# Patient Record
Sex: Female | Born: 1950
Health system: Southern US, Community
[De-identification: ages and names within clinical notes are randomized; demographics above are authoritative.]

## PROBLEM LIST (undated history)

## (undated) DIAGNOSIS — E46 Unspecified protein-calorie malnutrition: Secondary | ICD-10-CM

## (undated) DIAGNOSIS — I639 Cerebral infarction, unspecified: Secondary | ICD-10-CM

## (undated) DIAGNOSIS — Z923 Personal history of irradiation: Secondary | ICD-10-CM

## (undated) DIAGNOSIS — I10 Essential (primary) hypertension: Secondary | ICD-10-CM

## (undated) DIAGNOSIS — E119 Type 2 diabetes mellitus without complications: Secondary | ICD-10-CM

## (undated) DIAGNOSIS — C541 Malignant neoplasm of endometrium: Secondary | ICD-10-CM

## (undated) DIAGNOSIS — E785 Hyperlipidemia, unspecified: Secondary | ICD-10-CM

## (undated) DIAGNOSIS — K529 Noninfective gastroenteritis and colitis, unspecified: Secondary | ICD-10-CM

## (undated) HISTORY — DX: Hyperlipidemia, unspecified: E78.5

## (undated) HISTORY — PX: NO PAST SURGERIES: SHX2092

## (undated) HISTORY — DX: Malignant neoplasm of endometrium: C54.1

---

## 1999-03-06 ENCOUNTER — Other Ambulatory Visit: Admission: RE | Admit: 1999-03-06 | Discharge: 1999-03-06 | Payer: Self-pay | Admitting: Endocrinology

## 2000-01-08 ENCOUNTER — Encounter: Payer: Self-pay | Admitting: *Deleted

## 2000-01-08 ENCOUNTER — Emergency Department (HOSPITAL_COMMUNITY): Admission: EM | Admit: 2000-01-08 | Discharge: 2000-01-08 | Payer: Self-pay | Admitting: Emergency Medicine

## 2000-03-31 ENCOUNTER — Other Ambulatory Visit: Admission: RE | Admit: 2000-03-31 | Discharge: 2000-03-31 | Payer: Self-pay | Admitting: Endocrinology

## 2001-04-15 ENCOUNTER — Ambulatory Visit (HOSPITAL_COMMUNITY): Admission: RE | Admit: 2001-04-15 | Discharge: 2001-04-15 | Payer: Self-pay | Admitting: *Deleted

## 2003-06-06 ENCOUNTER — Encounter: Admission: RE | Admit: 2003-06-06 | Discharge: 2003-06-06 | Payer: Self-pay | Admitting: Internal Medicine

## 2003-06-20 ENCOUNTER — Other Ambulatory Visit: Admission: RE | Admit: 2003-06-20 | Discharge: 2003-06-20 | Payer: Self-pay | Admitting: Oncology

## 2005-04-01 ENCOUNTER — Emergency Department (HOSPITAL_COMMUNITY): Admission: EM | Admit: 2005-04-01 | Discharge: 2005-04-01 | Payer: Self-pay | Admitting: *Deleted

## 2006-07-09 ENCOUNTER — Encounter: Admission: RE | Admit: 2006-07-09 | Discharge: 2006-07-09 | Payer: Self-pay | Admitting: Internal Medicine

## 2006-09-03 ENCOUNTER — Other Ambulatory Visit: Admission: RE | Admit: 2006-09-03 | Discharge: 2006-09-03 | Payer: Self-pay | Admitting: Internal Medicine

## 2007-11-21 ENCOUNTER — Emergency Department (HOSPITAL_COMMUNITY): Admission: EM | Admit: 2007-11-21 | Discharge: 2007-11-21 | Payer: Self-pay | Admitting: Family Medicine

## 2007-11-25 ENCOUNTER — Emergency Department (HOSPITAL_COMMUNITY): Admission: EM | Admit: 2007-11-25 | Discharge: 2007-11-25 | Payer: Self-pay | Admitting: Emergency Medicine

## 2008-12-28 ENCOUNTER — Encounter: Admission: RE | Admit: 2008-12-28 | Discharge: 2008-12-28 | Payer: Self-pay | Admitting: Internal Medicine

## 2009-01-24 ENCOUNTER — Other Ambulatory Visit: Admission: RE | Admit: 2009-01-24 | Discharge: 2009-01-30 | Payer: Self-pay | Admitting: Internal Medicine

## 2010-02-08 ENCOUNTER — Encounter: Admission: RE | Admit: 2010-02-08 | Discharge: 2010-02-08 | Payer: Self-pay | Admitting: Internal Medicine

## 2010-02-14 ENCOUNTER — Encounter: Admission: RE | Admit: 2010-02-14 | Discharge: 2010-02-14 | Payer: Self-pay | Admitting: Internal Medicine

## 2010-09-20 NOTE — Procedures (Signed)
[facility]  Patient:    Maria Avila, Maria Avila Visit Number: [id number] MRN: [id number]          Service Type: END Location: ENDO Attending Physician:  Sabino Gasser Dictated by:   Sabino Gasser, M.D. Admit Date:  04/15/2001                             Procedure Report  PROCEDURE:  Colonoscopy.  SURGEON:  Sabino Gasser, M.D.  INDICATIONS:  Change in bowel habits, predominantly constipation, colon cancer screening.  ANESTHESIA:  Demerol 100 and Versed 12 mg.  DESCRIPTION OF PROCEDURE:  With the patient mildly sedated in the left lateral decubitus position, the Olympus videoscopic colonoscope was inserted in the rectum and passed under direct vision to the cecum, identified by the ileocecal valve and appendiceal orifice, both of which were photographed.  The prep was good.  From this point, the colonoscope was slowly withdrawn, taking circumferential views of the entire colonic mucosa, stopping only in the rectum which appeared on direct and on retroflexed view.  The endoscope was straightened and withdrawn.  The patients vital signs and pulse oximeter remained stable.  The patient tolerated the procedure well and without apparent complications.  FINDINGS:  This is a negative colonoscopic examination.  PLAN:  Will add more fiber to the patients diet and have her follow up with me as an outpatient to assess response. Dictated by:   Sabino Gasser, M.D. Attending Physician:  Sabino Gasser DD:  04/15/01 TD:  04/15/01 Job: [id number]

## 2011-01-31 LAB — URINALYSIS, ROUTINE W REFLEX MICROSCOPIC
Bilirubin Urine: NEGATIVE
Ketones, ur: NEGATIVE
Nitrite: NEGATIVE
Specific Gravity, Urine: 1.017
Urobilinogen, UA: 0.2
pH: 6.5

## 2011-01-31 LAB — URINE MICROSCOPIC-ADD ON

## 2011-03-10 ENCOUNTER — Other Ambulatory Visit: Payer: Self-pay | Admitting: Internal Medicine

## 2011-03-10 DIAGNOSIS — R1900 Intra-abdominal and pelvic swelling, mass and lump, unspecified site: Secondary | ICD-10-CM

## 2011-03-13 ENCOUNTER — Ambulatory Visit
Admission: RE | Admit: 2011-03-13 | Discharge: 2011-03-13 | Disposition: A | Discharge status: Home / Self Care | Payer: BC Managed Care – PPO | Source: Ambulatory Visit | Attending: Internal Medicine | Admitting: Internal Medicine

## 2011-03-13 DIAGNOSIS — R1900 Intra-abdominal and pelvic swelling, mass and lump, unspecified site: Secondary | ICD-10-CM

## 2011-12-08 ENCOUNTER — Other Ambulatory Visit: Payer: Self-pay | Admitting: Internal Medicine

## 2011-12-08 DIAGNOSIS — Z1231 Encounter for screening mammogram for malignant neoplasm of breast: Secondary | ICD-10-CM

## 2011-12-25 ENCOUNTER — Ambulatory Visit
Admission: RE | Admit: 2011-12-25 | Discharge: 2011-12-25 | Disposition: A | Discharge status: Home / Self Care | Payer: BC Managed Care – PPO | Source: Ambulatory Visit | Attending: Internal Medicine | Admitting: Internal Medicine

## 2011-12-25 DIAGNOSIS — Z1231 Encounter for screening mammogram for malignant neoplasm of breast: Secondary | ICD-10-CM

## 2012-04-19 ENCOUNTER — Other Ambulatory Visit (HOSPITAL_COMMUNITY)
Admission: RE | Admit: 2012-04-19 | Discharge: 2012-04-19 | Disposition: A | Discharge status: Home / Self Care | Payer: BC Managed Care – PPO | Source: Ambulatory Visit | Attending: Internal Medicine | Admitting: Internal Medicine

## 2012-04-19 DIAGNOSIS — Z01419 Encounter for gynecological examination (general) (routine) without abnormal findings: Secondary | ICD-10-CM | POA: Insufficient documentation

## 2013-02-15 ENCOUNTER — Other Ambulatory Visit: Payer: Self-pay

## 2013-02-15 DIAGNOSIS — Z1231 Encounter for screening mammogram for malignant neoplasm of breast: Secondary | ICD-10-CM

## 2013-03-04 ENCOUNTER — Ambulatory Visit
Admission: RE | Admit: 2013-03-04 | Discharge: 2013-03-04 | Disposition: A | Discharge status: Home / Self Care | Payer: BC Managed Care – PPO | Source: Ambulatory Visit

## 2013-03-04 DIAGNOSIS — Z1231 Encounter for screening mammogram for malignant neoplasm of breast: Secondary | ICD-10-CM

## 2013-03-08 ENCOUNTER — Ambulatory Visit: Payer: BC Managed Care – PPO

## 2014-02-13 ENCOUNTER — Other Ambulatory Visit: Payer: Self-pay

## 2014-02-13 DIAGNOSIS — Z1231 Encounter for screening mammogram for malignant neoplasm of breast: Secondary | ICD-10-CM

## 2014-03-07 ENCOUNTER — Ambulatory Visit
Admission: RE | Admit: 2014-03-07 | Discharge: 2014-03-07 | Disposition: A | Discharge status: Home / Self Care | Payer: BC Managed Care – PPO | Source: Ambulatory Visit

## 2014-03-07 DIAGNOSIS — Z1231 Encounter for screening mammogram for malignant neoplasm of breast: Secondary | ICD-10-CM

## 2015-04-09 ENCOUNTER — Other Ambulatory Visit: Payer: Self-pay

## 2015-04-09 DIAGNOSIS — Z1231 Encounter for screening mammogram for malignant neoplasm of breast: Secondary | ICD-10-CM

## 2015-04-26 ENCOUNTER — Ambulatory Visit
Admission: RE | Admit: 2015-04-26 | Discharge: 2015-04-26 | Disposition: A | Discharge status: Home / Self Care | Payer: BLUE CROSS/BLUE SHIELD | Source: Ambulatory Visit

## 2015-04-26 DIAGNOSIS — Z1231 Encounter for screening mammogram for malignant neoplasm of breast: Secondary | ICD-10-CM

## 2015-05-17 ENCOUNTER — Other Ambulatory Visit (HOSPITAL_COMMUNITY)
Admission: RE | Admit: 2015-05-17 | Discharge: 2015-05-17 | Disposition: A | Discharge status: Home / Self Care | Payer: BLUE CROSS/BLUE SHIELD | Source: Ambulatory Visit | Attending: Internal Medicine | Admitting: Internal Medicine

## 2015-05-17 DIAGNOSIS — Z1151 Encounter for screening for human papillomavirus (HPV): Secondary | ICD-10-CM | POA: Insufficient documentation

## 2015-05-17 DIAGNOSIS — Z01419 Encounter for gynecological examination (general) (routine) without abnormal findings: Secondary | ICD-10-CM | POA: Diagnosis present

## 2016-05-26 ENCOUNTER — Other Ambulatory Visit: Payer: Self-pay | Admitting: Internal Medicine

## 2016-05-26 DIAGNOSIS — Z1231 Encounter for screening mammogram for malignant neoplasm of breast: Secondary | ICD-10-CM

## 2016-05-28 ENCOUNTER — Ambulatory Visit
Admission: RE | Admit: 2016-05-28 | Discharge: 2016-05-28 | Disposition: A | Discharge status: Home / Self Care | Payer: BLUE CROSS/BLUE SHIELD | Source: Ambulatory Visit | Attending: Internal Medicine | Admitting: Internal Medicine

## 2016-05-28 DIAGNOSIS — Z1231 Encounter for screening mammogram for malignant neoplasm of breast: Secondary | ICD-10-CM

## 2017-03-16 DIAGNOSIS — I10 Essential (primary) hypertension: Secondary | ICD-10-CM | POA: Diagnosis not present

## 2017-03-16 DIAGNOSIS — Z23 Encounter for immunization: Secondary | ICD-10-CM | POA: Diagnosis not present

## 2017-05-05 DIAGNOSIS — I639 Cerebral infarction, unspecified: Secondary | ICD-10-CM

## 2017-05-05 HISTORY — DX: Cerebral infarction, unspecified: I63.9

## 2017-07-13 DIAGNOSIS — E7800 Pure hypercholesterolemia, unspecified: Secondary | ICD-10-CM | POA: Diagnosis not present

## 2017-07-13 DIAGNOSIS — R162 Hepatomegaly with splenomegaly, not elsewhere classified: Secondary | ICD-10-CM | POA: Diagnosis not present

## 2017-07-13 DIAGNOSIS — N390 Urinary tract infection, site not specified: Secondary | ICD-10-CM | POA: Diagnosis not present

## 2017-07-13 DIAGNOSIS — D709 Neutropenia, unspecified: Secondary | ICD-10-CM | POA: Diagnosis not present

## 2017-07-13 DIAGNOSIS — I10 Essential (primary) hypertension: Secondary | ICD-10-CM | POA: Diagnosis not present

## 2017-07-13 DIAGNOSIS — D509 Iron deficiency anemia, unspecified: Secondary | ICD-10-CM | POA: Diagnosis not present

## 2017-07-13 DIAGNOSIS — E119 Type 2 diabetes mellitus without complications: Secondary | ICD-10-CM | POA: Diagnosis not present

## 2017-07-13 DIAGNOSIS — E559 Vitamin D deficiency, unspecified: Secondary | ICD-10-CM | POA: Diagnosis not present

## 2017-07-13 DIAGNOSIS — R945 Abnormal results of liver function studies: Secondary | ICD-10-CM | POA: Diagnosis not present

## 2017-07-13 DIAGNOSIS — Z7982 Long term (current) use of aspirin: Secondary | ICD-10-CM | POA: Diagnosis not present

## 2017-07-13 DIAGNOSIS — R740 Nonspecific elevation of levels of transaminase and lactic acid dehydrogenase [LDH]: Secondary | ICD-10-CM | POA: Diagnosis not present

## 2017-07-13 DIAGNOSIS — D649 Anemia, unspecified: Secondary | ICD-10-CM | POA: Diagnosis not present

## 2017-07-16 DIAGNOSIS — E119 Type 2 diabetes mellitus without complications: Secondary | ICD-10-CM | POA: Diagnosis not present

## 2017-07-16 DIAGNOSIS — Z7982 Long term (current) use of aspirin: Secondary | ICD-10-CM | POA: Diagnosis not present

## 2017-07-16 DIAGNOSIS — Z0001 Encounter for general adult medical examination with abnormal findings: Secondary | ICD-10-CM | POA: Diagnosis not present

## 2017-07-16 DIAGNOSIS — E7800 Pure hypercholesterolemia, unspecified: Secondary | ICD-10-CM | POA: Diagnosis not present

## 2017-07-16 DIAGNOSIS — Z1212 Encounter for screening for malignant neoplasm of rectum: Secondary | ICD-10-CM | POA: Diagnosis not present

## 2017-07-16 DIAGNOSIS — I10 Essential (primary) hypertension: Secondary | ICD-10-CM | POA: Diagnosis not present

## 2017-07-17 ENCOUNTER — Telehealth: Payer: Self-pay | Admitting: Oncology

## 2017-07-17 NOTE — Telephone Encounter (Signed)
Appt has been scheduled for the pt to see Dr. Alen Blew on 3/19 at 11am. Pt aware to arrive 30 minutes early.

## 2017-07-20 ENCOUNTER — Other Ambulatory Visit: Payer: Self-pay | Admitting: Internal Medicine

## 2017-07-20 DIAGNOSIS — R634 Abnormal weight loss: Secondary | ICD-10-CM

## 2017-07-20 DIAGNOSIS — R162 Hepatomegaly with splenomegaly, not elsewhere classified: Secondary | ICD-10-CM

## 2017-07-21 ENCOUNTER — Telehealth: Payer: Self-pay | Admitting: Oncology

## 2017-07-21 ENCOUNTER — Inpatient Hospital Stay: Payer: Medicare Other

## 2017-07-21 ENCOUNTER — Inpatient Hospital Stay: Payer: Medicare Other | Attending: Oncology | Admitting: Oncology

## 2017-07-21 VITALS — BP 95/70 | HR 122 | Temp 98.7°F | Resp 20 | Ht 62.0 in | Wt 144.7 lb

## 2017-07-21 DIAGNOSIS — D709 Neutropenia, unspecified: Secondary | ICD-10-CM | POA: Diagnosis not present

## 2017-07-21 DIAGNOSIS — E119 Type 2 diabetes mellitus without complications: Secondary | ICD-10-CM | POA: Diagnosis not present

## 2017-07-21 DIAGNOSIS — C8590 Non-Hodgkin lymphoma, unspecified, unspecified site: Secondary | ICD-10-CM

## 2017-07-21 DIAGNOSIS — R634 Abnormal weight loss: Secondary | ICD-10-CM | POA: Diagnosis not present

## 2017-07-21 DIAGNOSIS — D649 Anemia, unspecified: Secondary | ICD-10-CM | POA: Insufficient documentation

## 2017-07-21 DIAGNOSIS — D72820 Lymphocytosis (symptomatic): Secondary | ICD-10-CM

## 2017-07-21 LAB — CMP (CANCER CENTER ONLY)
ALBUMIN: 3.1 g/dL — AB (ref 3.5–5.0)
ALT: 47 U/L (ref 0–55)
ANION GAP: 9 (ref 3–11)
AST: 111 U/L — ABNORMAL HIGH (ref 5–34)
Alkaline Phosphatase: 90 U/L (ref 40–150)
BUN: 6 mg/dL — ABNORMAL LOW (ref 7–26)
CO2: 30 mmol/L — AB (ref 22–29)
Calcium: 9.6 mg/dL (ref 8.4–10.4)
Chloride: 97 mmol/L — ABNORMAL LOW (ref 98–109)
Creatinine: 0.78 mg/dL (ref 0.60–1.10)
GFR, Est AFR Am: >60 mL/min (ref >60)
GFR, Estimated: >60 mL/min (ref >60)
GLUCOSE: 104 mg/dL (ref 70–140)
POTASSIUM: 4 mmol/L (ref 3.5–5.1)
SODIUM: 136 mmol/L (ref 136–145)
Total Bilirubin: 1.2 mg/dL (ref 0.2–1.2)
Total Protein: 7 g/dL (ref 6.4–8.3)

## 2017-07-21 LAB — CBC WITH DIFFERENTIAL (CANCER CENTER ONLY)
Basophils Absolute: 0 10*3/uL (ref 0.0–0.1)
Basophils Relative: 1 %
Eosinophils Absolute: 0 10*3/uL (ref 0.0–0.5)
Eosinophils Relative: 1 %
HCT: 32.5 % — ABNORMAL LOW (ref 34.8–46.6)
HEMOGLOBIN: 11.3 g/dL — AB (ref 11.6–15.9)
Lymphocytes Relative: 45 %
Lymphs Abs: 1.7 10*3/uL (ref 0.9–3.3)
MCH: 36.1 pg — AB (ref 25.1–34.0)
MCHC: 34.8 g/dL (ref 31.5–36.0)
MCV: 103.8 fL — ABNORMAL HIGH (ref 79.5–101.0)
MONO ABS: 0.4 10*3/uL (ref 0.1–0.9)
MONOS PCT: 11 %
NEUTROS ABS: 1.6 10*3/uL (ref 1.5–6.5)
Neutrophils Relative %: 42 %
PLATELETS: 186 10*3/uL (ref 145–400)
RBC: 3.13 MIL/uL — AB (ref 3.70–5.45)
RDW: 14.6 % — ABNORMAL HIGH (ref 11.2–14.5)
WBC Count: 3.8 10*3/uL — ABNORMAL LOW (ref 3.9–10.3)

## 2017-07-21 LAB — LACTATE DEHYDROGENASE: LDH: 181 U/L (ref 125–245)

## 2017-07-21 LAB — SAVE SMEAR

## 2017-07-21 NOTE — Telephone Encounter (Signed)
Appointment scheduled AVS/Calendar printed per 3/19 los

## 2017-07-21 NOTE — Progress Notes (Signed)
Reason for Referral: Lymphocytosis.  HPI: 67 year old woman currently of [place] where she lived the majority of her life.  She has a past medical history significant for type 2 diabetes, hyperlipidemia but no other significant comorbid conditions.  She was seen by her primary care physician for routine physical as well as complaints of poor appetite.  She has noticed that she has lost close to 40 pounds in the last few years.  She does not report any episodes of dysphagia or odontophagia but does report lack of appetite.  She had a CBC done at the time showed a hemoglobin of 10.6 and white cell count of 4.1.  Her platelets were 185.  Neutrophil percentage was 24.1% and a lymphocytosis of 64.1.  Previous CBC obtained in December 2015 showed a white cell count of 4.8, hemoglobin of 13.4 and neutrophils low at the time at 32.0%.  She did have a lymphocytosis back then as well.  Her creatinine was 1.1 with normal calcium, sodium and other electrolytes.  Based on these findings she was referred to me for evaluation.  She is really no specific symptoms and her complaints are rather vague.  She denies any lymphadenopathy or easy bruising.  She does report feeling cold all the time.  She is up-to-date on her mammography as well as colonoscopy.  She does not report any headaches, blurry vision, syncope or seizures. Does not report any fevers, chills or sweats.  Does not report any cough, wheezing or hemoptysis.  Does not report any chest pain, palpitation, orthopnea or leg edema.  Does not report any nausea, vomiting or abdominal pain.  Does not report any diarrhea but does report constipation.  Does not report any skeletal complaints.    Does not report frequency, urgency or hematuria.  Does not report any skin rashes or lesions. Does not report any heat or cold intolerance.  Does not report any lymphadenopathy or petechiae.  Does not report any anxiety or depression.  Remaining review of systems is negative.      Past medical history mentioned the history of present illness.   Current Outpatient Medications:  .  aspirin EC 81 MG tablet, Take 81 mg by mouth daily., Disp: , Rfl:  .  Cholecalciferol (VITAMIN D) 2000 units CAPS, Take 1 capsule by mouth daily., Disp: , Rfl:  .  lisinopril (PRINIVIL,ZESTRIL) 10 MG tablet, Take 10 mg by mouth daily., Disp: , Rfl: 3 .  Multiple Vitamin (MULTIVITAMIN) capsule, Take 1 capsule by mouth daily., Disp: , Rfl:  .  naproxen sodium (ALEVE) 220 MG tablet, Take 220 mg by mouth 2 (two) times daily as needed., Disp: , Rfl:  .  Omega-3 Fatty Acids (FISH OIL) 1000 MG CAPS, Take 1 capsule by mouth daily., Disp: , Rfl:  .  pantoprazole (PROTONIX) 40 MG tablet, Take 40 mg by mouth daily., Disp: , Rfl: :  Allergies  Allergen Reactions  . Crestor [Rosuvastatin Calcium] Swelling and Other (See Comments)    Swelling of legs and muscle weakness  . Zocor [Simvastatin] Swelling and Other (See Comments)    Swelling of legs and muscle weakness  :  No family history of malignancy or blood disorder noted    Social History   Socioeconomic History  . Marital status: Married    Spouse name: Not on file  . Number of children: Not on file  . Years of education: Not on file  . Highest education level: Not on file  Social Needs  . Financial resource strain:  Not on file  . Food insecurity - worry: Not on file  . Food insecurity - inability: Not on file  . Transportation needs - medical: Not on file  . Transportation needs - non-medical: Not on file  Occupational History  . Not on file  Tobacco Use  . Smoking status: Not on file  Substance and Sexual Activity  . Alcohol use: Not on file  . Drug use: Not on file  . Sexual activity: Not on file  Other Topics Concern  . Not on file  Social History Narrative  . Not on file  :  Pertinent items are noted in HPI.  Exam: Blood pressure 95/70, pulse (!) 122, temperature 98.7 F (37.1 C), temperature source Oral, resp.  rate 20, height 5\' 2"  (1.575 m), weight 144 lb 11.2 oz (65.6 kg), SpO2 100 %. General appearance: alert and cooperative appeared without distress. Head: atraumatic without any abnormalities. Eyes: conjunctivae/corneas clear. PERRL.  Sclera anicteric. Throat: lips, mucosa, and tongue normal; without oral thrush or ulcers. Resp: clear to auscultation bilaterally without rhonchi, wheezes or dullness to percussion. Cardio: regular rate and rhythm, S1, S2 normal, no murmur, click, rub or gallop GI: soft, non-tender; bowel sounds normal; no masses,  no splenomegaly. Skin: Skin color, texture, turgor normal. No rashes or lesions Lymph nodes: Cervical, supraclavicular, and axillary nodes normal. Neurologic: Grossly normal without any motor, sensory or deep tendon reflexes. Musculoskeletal: No joint deformity or effusion.    Assessment and Plan:   67 year old woman with the following issues:  1.  Lymphocytosis and neutropenia: This was detected on a CBC obtained on March 2019.  Her white cell count was 4.1 with neutrophil percentage 24%.  She has also lymphocytosis noted.  Her CBC obtained in 2015 showed similar findings but certainly showed worsening anemia hemoglobin dropping from 13 to 10.1.  Her platelet counts continue to be within normal range.  The differential diagnosis was discussed today with the patient in detail.  This includes lymphoproliferative disorder such as CLL or other lymphomas.  Reactive findings related to autoimmune etiologies or benign fluctuating neutropenia causing mild lymphocytosis.  To investigate this fully, she will have a repeat a CBC today, for cytometry and a peripheral smear to rule out lymphoproliferative disorder and lymphoma.  CT scan of the chest abdomen and pelvis will also be obtained given her weight loss and constitutional symptoms.  Management of this disorder will be deferred until a final diagnosis is made.  2.  Weight loss and constitutional symptoms:  Unclear etiology.  Malignancy need to be evaluated.  Evaluation for lymphoma as outlined above.  She is up-to-date on her age-appropriate cancer screening including colonoscopy and mammography.  3.  Follow-up: We will be in the next few weeks to follow her progress and discuss the results of the workup.  60  minutes was spent with the patient face-to-face today.  More than 50% of time was dedicated to patient counseling, education and coordination of her multifaceted care.

## 2017-07-22 LAB — FLOW CYTOMETRY

## 2017-07-29 ENCOUNTER — Ambulatory Visit
Admission: RE | Admit: 2017-07-29 | Discharge: 2017-07-29 | Disposition: A | Discharge status: Home / Self Care | Payer: Medicare Other | Source: Ambulatory Visit | Attending: Internal Medicine | Admitting: Internal Medicine

## 2017-07-29 DIAGNOSIS — R162 Hepatomegaly with splenomegaly, not elsewhere classified: Secondary | ICD-10-CM

## 2017-07-29 DIAGNOSIS — R634 Abnormal weight loss: Secondary | ICD-10-CM

## 2017-07-29 DIAGNOSIS — K760 Fatty (change of) liver, not elsewhere classified: Secondary | ICD-10-CM | POA: Diagnosis not present

## 2017-07-30 DIAGNOSIS — E119 Type 2 diabetes mellitus without complications: Secondary | ICD-10-CM | POA: Diagnosis not present

## 2017-07-30 DIAGNOSIS — D649 Anemia, unspecified: Secondary | ICD-10-CM | POA: Diagnosis not present

## 2017-07-30 DIAGNOSIS — I10 Essential (primary) hypertension: Secondary | ICD-10-CM | POA: Diagnosis not present

## 2017-07-30 DIAGNOSIS — R740 Nonspecific elevation of levels of transaminase and lactic acid dehydrogenase [LDH]: Secondary | ICD-10-CM | POA: Diagnosis not present

## 2017-07-30 DIAGNOSIS — E639 Nutritional deficiency, unspecified: Secondary | ICD-10-CM | POA: Diagnosis not present

## 2017-07-30 DIAGNOSIS — D709 Neutropenia, unspecified: Secondary | ICD-10-CM | POA: Diagnosis not present

## 2017-08-03 DIAGNOSIS — R1310 Dysphagia, unspecified: Secondary | ICD-10-CM | POA: Diagnosis not present

## 2017-08-03 DIAGNOSIS — R748 Abnormal levels of other serum enzymes: Secondary | ICD-10-CM | POA: Diagnosis not present

## 2017-08-03 DIAGNOSIS — R6881 Early satiety: Secondary | ICD-10-CM | POA: Diagnosis not present

## 2017-08-11 DIAGNOSIS — H1712 Central corneal opacity, left eye: Secondary | ICD-10-CM | POA: Diagnosis not present

## 2017-08-11 DIAGNOSIS — H3589 Other specified retinal disorders: Secondary | ICD-10-CM | POA: Diagnosis not present

## 2017-08-11 DIAGNOSIS — H25813 Combined forms of age-related cataract, bilateral: Secondary | ICD-10-CM | POA: Diagnosis not present

## 2017-08-11 DIAGNOSIS — E119 Type 2 diabetes mellitus without complications: Secondary | ICD-10-CM | POA: Diagnosis not present

## 2017-08-17 ENCOUNTER — Inpatient Hospital Stay: Payer: Medicare Other | Attending: Oncology | Admitting: Oncology

## 2017-08-17 VITALS — BP 128/80 | HR 77 | Temp 98.3°F | Resp 16 | Ht 62.0 in | Wt 143.0 lb

## 2017-08-17 DIAGNOSIS — D72819 Decreased white blood cell count, unspecified: Secondary | ICD-10-CM | POA: Diagnosis not present

## 2017-08-17 DIAGNOSIS — D72820 Lymphocytosis (symptomatic): Secondary | ICD-10-CM | POA: Diagnosis not present

## 2017-08-17 DIAGNOSIS — R634 Abnormal weight loss: Secondary | ICD-10-CM | POA: Diagnosis not present

## 2017-08-17 DIAGNOSIS — D709 Neutropenia, unspecified: Secondary | ICD-10-CM | POA: Diagnosis not present

## 2017-08-17 DIAGNOSIS — D7589 Other specified diseases of blood and blood-forming organs: Secondary | ICD-10-CM | POA: Diagnosis not present

## 2017-08-17 NOTE — Progress Notes (Signed)
Hematology and Oncology Follow Up Visit  Maria Avila [id number] 1950-07-17 67 y.o. 08/17/2017 9:27 AM [name], MDPharr, Thayer Jew, MD   Principle Diagnosis: 67 year old woman with a lymphocytosis and weight loss diagnosed in March 2019.  Her workup did not reveal any proliferative disorder or malignancy.   Current therapy: Active surveillance.  Interim History: [name] presents today for a follow-up visit.  She does not report any headaches, blurry vision, syncope or seizures. Does not report any fevers, chills or sweats.  Does not report any cough, wheezing or hemoptysis.  Does not report any chest pain, palpitation, orthopnea or leg edema.  Does not report any nausea, vomiting or abdominal pain.  Does not report any constipation or diarrhea.  Does not report any skeletal complaints.    Does not report frequency, urgency or hematuria.  Does not report any skin rashes or lesions. Does not report any heat or cold intolerance.  Does not report any lymphadenopathy or petechiae.  Does not report any anxiety or depression.  Remaining review of systems is negative.    Medications: I have reviewed the patient's current medications.  Current Outpatient Medications  Medication Sig Dispense Refill  . aspirin EC 81 MG tablet Take 81 mg by mouth daily.    . Cholecalciferol (VITAMIN D) 2000 units CAPS Take 1 capsule by mouth daily.    Marland Kitchen lisinopril (PRINIVIL,ZESTRIL) 10 MG tablet Take 10 mg by mouth daily.  3  . Multiple Vitamin (MULTIVITAMIN) capsule Take 1 capsule by mouth daily.    . naproxen sodium (ALEVE) 220 MG tablet Take 220 mg by mouth 2 (two) times daily as needed.    . Omega-3 Fatty Acids (FISH OIL) 1000 MG CAPS Take 1 capsule by mouth daily.    . pantoprazole (PROTONIX) 40 MG tablet Take 40 mg by mouth daily.     No current facility-administered medications for this visit.      Allergies:  Allergies  Allergen Reactions  . Crestor [Rosuvastatin Calcium] Swelling and Other (See  Comments)    Swelling of legs and muscle weakness  . Zocor [Simvastatin] Swelling and Other (See Comments)    Swelling of legs and muscle weakness    Past Medical History, Surgical history, Social history, and Family History were reviewed and updated.  Review of Systems:  Remaining ROS negative.  Physical Exam:  ECOG:  General appearance: alert and cooperative appeared without distress. Head: Normocephalic, without obvious abnormality Oropharynx: No oral thrush or ulcers. Eyes: No scleral icterus.  Pupils are equal and round reactive to light. Lymph nodes: Cervical, supraclavicular, and axillary nodes normal. Heart:regular rate and rhythm, S1, S2 normal, no murmur, click, rub or gallop Lung:chest clear, no wheezing, rales, normal symmetric air entry Abdomin: soft, non-tender, without masses or organomegaly. Neurological: No motor, sensory deficits.  Intact deep tendon reflexes. Skin: No rashes or lesions.  No ecchymosis or petechiae. Musculoskeletal: No joint deformity or effusion. Psychiatric: Mood and affect are appropriate.    Lab Results: Lab Results  Component Value Date   WBC 3.8 (L) 07/21/2017   HCT 32.5 (L) 07/21/2017   MCV 103.8 (H) 07/21/2017   PLT 186 07/21/2017     Chemistry      Component Value Date/Time   NA 136 07/21/2017 1131   K 4.0 07/21/2017 1131   CL 97 (L) 07/21/2017 1131   CO2 30 (H) 07/21/2017 1131   BUN 6 (L) 07/21/2017 1131   CREATININE 0.78 07/21/2017 1131      Component Value Date/Time  CALCIUM 9.6 07/21/2017 1131   ALKPHOS 90 07/21/2017 1131   AST 111 (H) 07/21/2017 1131   ALT 47 07/21/2017 1131   BILITOT 1.2 07/21/2017 1131       Impression and Plan:  67 year old woman with:   1.  Lymphocytosis and neutropenia diagnosed in March 2019: Repeat a CBC on July 21, 2017 was personally reviewed today and showed her absolute neutrophil count of 1600 without any lymphocytosis.  Flow cytometry did not reveal any abnormalities or  lymphoproliferative disorder.  Her leukocytopenia is likely related to benign findings and no further intervention is needed.    2.  Weight loss and constitutional symptoms: CT scan of abdomen and pelvis obtained on 07/29/2017 was reviewed and showed no evidence of malignancy.  Her weight has been stable in the last month.  3.  Macrocytosis: Could be related to early liver disease associated with her hepatic steatosis.  No intervention is needed at this time.  3.  Follow-up: I am happy to see her in the future as needed.  15 minutes was spent with the patient face-to-face today.  More than 50% of time was dedicated to patient counseling, education and answering question regarding her abnormal laboratory findings.       Zola Button, MD 4/15/20199:27 AM

## 2017-08-26 DIAGNOSIS — K2980 Duodenitis without bleeding: Secondary | ICD-10-CM | POA: Diagnosis not present

## 2017-08-26 DIAGNOSIS — K228 Other specified diseases of esophagus: Secondary | ICD-10-CM | POA: Diagnosis not present

## 2017-08-26 DIAGNOSIS — K2930 Chronic superficial gastritis without bleeding: Secondary | ICD-10-CM | POA: Diagnosis not present

## 2017-08-26 DIAGNOSIS — R1012 Left upper quadrant pain: Secondary | ICD-10-CM | POA: Diagnosis not present

## 2017-08-26 DIAGNOSIS — R6881 Early satiety: Secondary | ICD-10-CM | POA: Diagnosis not present

## 2017-08-26 DIAGNOSIS — R1310 Dysphagia, unspecified: Secondary | ICD-10-CM | POA: Diagnosis not present

## 2017-08-28 DIAGNOSIS — R9389 Abnormal findings on diagnostic imaging of other specified body structures: Secondary | ICD-10-CM | POA: Diagnosis not present

## 2017-09-03 DIAGNOSIS — K228 Other specified diseases of esophagus: Secondary | ICD-10-CM | POA: Diagnosis not present

## 2017-09-03 DIAGNOSIS — K2980 Duodenitis without bleeding: Secondary | ICD-10-CM | POA: Diagnosis not present

## 2017-09-09 DIAGNOSIS — K760 Fatty (change of) liver, not elsewhere classified: Secondary | ICD-10-CM | POA: Diagnosis not present

## 2017-09-10 DIAGNOSIS — R109 Unspecified abdominal pain: Secondary | ICD-10-CM | POA: Diagnosis not present

## 2017-09-10 DIAGNOSIS — E119 Type 2 diabetes mellitus without complications: Secondary | ICD-10-CM | POA: Diagnosis not present

## 2017-09-23 DIAGNOSIS — R000 Tachycardia, unspecified: Secondary | ICD-10-CM | POA: Diagnosis not present

## 2017-09-23 DIAGNOSIS — E639 Nutritional deficiency, unspecified: Secondary | ICD-10-CM | POA: Diagnosis not present

## 2017-10-01 DIAGNOSIS — K760 Fatty (change of) liver, not elsewhere classified: Secondary | ICD-10-CM | POA: Diagnosis not present

## 2017-10-26 DIAGNOSIS — N8500 Endometrial hyperplasia, unspecified: Secondary | ICD-10-CM | POA: Diagnosis not present

## 2017-10-26 DIAGNOSIS — N950 Postmenopausal bleeding: Secondary | ICD-10-CM | POA: Diagnosis not present

## 2017-10-28 DIAGNOSIS — Z7689 Persons encountering health services in other specified circumstances: Secondary | ICD-10-CM | POA: Diagnosis not present

## 2017-10-28 DIAGNOSIS — N840 Polyp of corpus uteri: Secondary | ICD-10-CM | POA: Diagnosis not present

## 2018-02-01 ENCOUNTER — Other Ambulatory Visit: Payer: Self-pay

## 2018-02-01 ENCOUNTER — Encounter (HOSPITAL_COMMUNITY): Payer: Self-pay | Admitting: Emergency Medicine

## 2018-02-01 ENCOUNTER — Emergency Department (HOSPITAL_COMMUNITY)
Admission: EM | Admit: 2018-02-01 | Discharge: 2018-02-01 | Disposition: A | Discharge status: Home / Self Care | Payer: Medicare Other | Attending: Emergency Medicine | Admitting: Emergency Medicine

## 2018-02-01 DIAGNOSIS — Z79899 Other long term (current) drug therapy: Secondary | ICD-10-CM | POA: Diagnosis not present

## 2018-02-01 DIAGNOSIS — I10 Essential (primary) hypertension: Secondary | ICD-10-CM | POA: Diagnosis not present

## 2018-02-01 DIAGNOSIS — Z7982 Long term (current) use of aspirin: Secondary | ICD-10-CM | POA: Diagnosis not present

## 2018-02-01 DIAGNOSIS — R197 Diarrhea, unspecified: Secondary | ICD-10-CM | POA: Insufficient documentation

## 2018-02-01 DIAGNOSIS — R9431 Abnormal electrocardiogram [ECG] [EKG]: Secondary | ICD-10-CM | POA: Diagnosis not present

## 2018-02-01 DIAGNOSIS — R112 Nausea with vomiting, unspecified: Secondary | ICD-10-CM | POA: Diagnosis not present

## 2018-02-01 HISTORY — DX: Essential (primary) hypertension: I10

## 2018-02-01 LAB — URINALYSIS, ROUTINE W REFLEX MICROSCOPIC
Bacteria, UA: NONE SEEN
Glucose, UA: NEGATIVE mg/dL
Hgb urine dipstick: NEGATIVE
Ketones, ur: 20 mg/dL — AB
Nitrite: NEGATIVE
PH: 6 (ref 5.0–8.0)
Protein, ur: 100 mg/dL — AB
SPECIFIC GRAVITY, URINE: 1.027 (ref 1.005–1.030)

## 2018-02-01 LAB — COMPREHENSIVE METABOLIC PANEL
ALT: 31 U/L (ref 0–44)
AST: 57 U/L — ABNORMAL HIGH (ref 15–41)
Albumin: 3 g/dL — ABNORMAL LOW (ref 3.5–5.0)
Alkaline Phosphatase: 69 U/L (ref 38–126)
Anion gap: 15 (ref 5–15)
BUN: 11 mg/dL (ref 8–23)
CALCIUM: 9.9 mg/dL (ref 8.9–10.3)
CO2: 24 mmol/L (ref 22–32)
Chloride: 101 mmol/L (ref 98–111)
Creatinine, Ser: 0.92 mg/dL (ref 0.44–1.00)
GFR calc non Af Amer: >60 mL/min (ref >60)
Glucose, Bld: 102 mg/dL — ABNORMAL HIGH (ref 70–99)
Potassium: 3.4 mmol/L — ABNORMAL LOW (ref 3.5–5.1)
SODIUM: 140 mmol/L (ref 135–145)
Total Bilirubin: 1.6 mg/dL — ABNORMAL HIGH (ref 0.3–1.2)
Total Protein: 7.2 g/dL (ref 6.5–8.1)

## 2018-02-01 LAB — CBC
HCT: 37.4 % (ref 36.0–46.0)
Hemoglobin: 12.6 g/dL (ref 12.0–15.0)
MCH: 35 pg — AB (ref 26.0–34.0)
MCHC: 33.7 g/dL (ref 30.0–36.0)
MCV: 103.9 fL — AB (ref 78.0–100.0)
PLATELETS: 276 10*3/uL (ref 150–400)
RBC: 3.6 MIL/uL — ABNORMAL LOW (ref 3.87–5.11)
RDW: 12.7 % (ref 11.5–15.5)
WBC: 5.4 10*3/uL (ref 4.0–10.5)

## 2018-02-01 LAB — LIPASE, BLOOD: Lipase: 42 U/L (ref 11–51)

## 2018-02-01 MED ORDER — ONDANSETRON 4 MG PO TBDP: 4.0000 mg | ORAL_TABLET | Freq: Four times a day (QID) | ORAL | 0 refills | Status: DC | PRN

## 2018-02-01 MED ORDER — SODIUM CHLORIDE 0.9 % IV BOLUS (SEPSIS)
1000.0000 mL | Freq: Once | INTRAVENOUS | Status: AC
  Administered 2018-02-01: 1000 mL via INTRAVENOUS

## 2018-02-01 MED ORDER — ONDANSETRON HCL 4 MG/2ML IJ SOLN
4.0000 mg | Freq: Once | INTRAMUSCULAR | Status: AC
  Administered 2018-02-01: 4 mg via INTRAVENOUS
  Filled 2018-02-01: qty 2

## 2018-02-01 NOTE — Discharge Instructions (Addendum)
I recommend close follow-up with your primary care physician and gastroenterologist for vomiting and diarrhea that has been present for 3 months.  Your labs, urine today were normal.  You may use Zofran as needed for vomiting, over-the-counter Imodium as needed for diarrhea.

## 2018-02-01 NOTE — ED Provider Notes (Signed)
TIME SEEN: 2:11 AM  CHIEF COMPLAINT: Vomiting, diarrhea  HPI: Patient is a 67 year old female with history of hypertension who presents to the emergency department with intermittent nausea, vomiting, diarrhea, decreased appetite for the past 3 months.  States no significant change today she just "got tired of it and wanted to find out what was going on".  Has a PCP and has been followed up with gastroenterology as well and has had an outpatient endoscopy which she reports was negative.  No abdominal surgeries.  No fever.  No chest pain or shortness of breath.  No dysuria or hematuria.  States she feels dehydrated.  ROS: See HPI Constitutional: no fever  Eyes: no drainage  ENT: no runny nose   Cardiovascular:  no chest pain  Resp: no SOB  GI: Intermittent vomiting and diarrhea GU: no dysuria Integumentary: no rash  Allergy: no hives  Musculoskeletal: no leg swelling  Neurological: no slurred speech ROS otherwise negative  PAST MEDICAL HISTORY/PAST SURGICAL HISTORY:  Past Medical History:  Diagnosis Date  . Hypertension     MEDICATIONS:  Prior to Admission medications   Medication Sig Start Date End Date Taking? Authorizing Provider  aspirin EC 81 MG tablet Take 81 mg by mouth daily. 07/21/17   [provider]  Cholecalciferol (VITAMIN D) 2000 units CAPS Take 1 capsule by mouth daily. 07/21/17   [provider]  lisinopril (PRINIVIL,ZESTRIL) 10 MG tablet Take 10 mg by mouth daily. 07/16/17   [provider]  Multiple Vitamin (MULTIVITAMIN) capsule Take 1 capsule by mouth daily. 07/21/17   [provider]  naproxen sodium (ALEVE) 220 MG tablet Take 220 mg by mouth 2 (two) times daily as needed. 07/21/17   [provider]  Omega-3 Fatty Acids (FISH OIL) 1000 MG CAPS Take 1 capsule by mouth daily. 07/21/17   [provider]  pantoprazole (PROTONIX) 40 MG tablet Take 40 mg by mouth daily. 07/21/17   [provider]    ALLERGIES:   Allergies  Allergen Reactions  . Crestor [Rosuvastatin Calcium] Swelling and Other (See Comments)    Swelling of legs and muscle weakness  . Zocor [Simvastatin] Swelling and Other (See Comments)    Swelling of legs and muscle weakness    SOCIAL HISTORY:  Social History   Tobacco Use  . Smoking status: Never Smoker  . Smokeless tobacco: Never Used  Substance Use Topics  . Alcohol use: Yes    FAMILY HISTORY: No family history on file.  EXAM: BP 118/90 (BP Location: Right Arm)   Pulse (!) 122   Temp 97.9 F (36.6 C) (Oral)   Resp 20   SpO2 99%  CONSTITUTIONAL: Alert and oriented and responds appropriately to questions. Well-appearing; well-nourished, thin but does not appear significantly dehydrated HEAD: Normocephalic EYES: Conjunctivae clear, pupils appear equal, EOMI ENT: normal nose; moist mucous membranes NECK: Supple, no meningismus, no nuchal rigidity, no LAD  CARD: RRR; S1 and S2 appreciated; no murmurs, no clicks, no rubs, no gallops RESP: Normal chest excursion without splinting or tachypnea; breath sounds clear and equal bilaterally; no wheezes, no rhonchi, no rales, no hypoxia or respiratory distress, speaking full sentences ABD/GI: Normal bowel sounds; non-distended; soft, non-tender, no rebound, no guarding, no peritoneal signs, no hepatosplenomegaly BACK:  The back appears normal and is non-tender to palpation, there is no CVA tenderness EXT: Normal ROM in all joints; non-tender to palpation; no edema; normal capillary refill; no cyanosis, no calf tenderness or swelling    SKIN: Normal color  for age and race; warm; no rash NEURO: Moves all extremities equally PSYCH: The patient's mood and manner are appropriate. Grooming and personal hygiene are appropriate.  MEDICAL DECISION MAKING: Patient here with intermittent vomiting and diarrhea for 3 months.  She appears thin on exam and it was initially tachycardic but this has improved without intervention.  Labs,  urine ordered in triage are unremarkable other than moderate ketones.  Will give IV fluids, Zofran for symptomatic relief.  EKG shows no ischemic abnormality, arrhythmia.  I do not feel she needs emergent CT imaging at this time given benign abdominal exam.  Have recommended continued outpatient follow-up with PCP and GI.  Will reassess after symptomatic treatment.  Low suspicion for diverticulitis, colitis, bowel obstruction, appendicitis, cholecystitis, pancreatitis given benign exam.  ED PROGRESS: Patient reports feeling better.  No vomiting or diarrhea in the ED.  Will discharge home after IV fluids complete.  She will follow-up with her PCP and gastroenterologist.  Will discharge with prescription of Zofran.   At this time, I do not feel there is any life-threatening condition present. I have reviewed and discussed all results (EKG, imaging, lab, urine as appropriate) and exam findings with patient/family. I have reviewed nursing notes and appropriate previous records.  I feel the patient is safe to be discharged home without further emergent workup and can continue workup as an outpatient as needed. Discussed usual and customary return precautions. Patient/family verbalize understanding and are comfortable with this plan.  Outpatient follow-up has been provided if needed. All questions have been answered.      EKG Interpretation  Date/Time:  Monday February 01 2018 02:28:05 EDT Ventricular Rate:  71 PR Interval:    QRS Duration: 77 QT Interval:  410 QTC Calculation: 446 R Axis:   25 Text Interpretation:  Sinus rhythm Low voltage, precordial leads Abnormal R-wave progression, early transition No old tracing to compare Confirmed by Merri Dimaano, Cyril Mourning [phone]) on 02/01/2018 2:39:52 AM         Ksean Vale, Delice Bison, DO 02/01/18 [id number]

## 2018-02-01 NOTE — ED Triage Notes (Signed)
C/o intermittent nausea, vomiting, and diarrhea x 2-3 months.  Denies abd pain.

## 2018-02-01 NOTE — ED Notes (Signed)
Patient verbalizes understanding of discharge instructions. Opportunity for questioning and answers were provided. Armband removed by staff, pt discharged from ED to home via POV  

## 2018-02-01 NOTE — ED Notes (Signed)
Pt reports a 3 month history of nausea and not feeling like eating. Pt reports she is being followed by her pcp but they only keep telling her to eat.

## 2018-02-05 ENCOUNTER — Emergency Department (HOSPITAL_COMMUNITY): Payer: Medicare Other

## 2018-02-05 ENCOUNTER — Emergency Department (HOSPITAL_COMMUNITY)
Admission: EM | Admit: 2018-02-05 | Discharge: 2018-02-05 | Disposition: A | Discharge status: Home / Self Care | Payer: Medicare Other | Attending: Emergency Medicine | Admitting: Emergency Medicine

## 2018-02-05 ENCOUNTER — Other Ambulatory Visit: Payer: Self-pay

## 2018-02-05 ENCOUNTER — Encounter (HOSPITAL_COMMUNITY): Payer: Self-pay | Admitting: Emergency Medicine

## 2018-02-05 DIAGNOSIS — R1011 Right upper quadrant pain: Secondary | ICD-10-CM | POA: Diagnosis not present

## 2018-02-05 DIAGNOSIS — I10 Essential (primary) hypertension: Secondary | ICD-10-CM | POA: Insufficient documentation

## 2018-02-05 DIAGNOSIS — K529 Noninfective gastroenteritis and colitis, unspecified: Secondary | ICD-10-CM

## 2018-02-05 DIAGNOSIS — Z79899 Other long term (current) drug therapy: Secondary | ICD-10-CM | POA: Diagnosis not present

## 2018-02-05 DIAGNOSIS — Z7982 Long term (current) use of aspirin: Secondary | ICD-10-CM | POA: Diagnosis not present

## 2018-02-05 DIAGNOSIS — E639 Nutritional deficiency, unspecified: Secondary | ICD-10-CM | POA: Diagnosis not present

## 2018-02-05 DIAGNOSIS — K760 Fatty (change of) liver, not elsewhere classified: Secondary | ICD-10-CM | POA: Diagnosis not present

## 2018-02-05 LAB — URINALYSIS, ROUTINE W REFLEX MICROSCOPIC
Glucose, UA: NEGATIVE mg/dL
Hgb urine dipstick: NEGATIVE
KETONES UR: 20 mg/dL — AB
Nitrite: NEGATIVE
Protein, ur: 30 mg/dL — AB
Specific Gravity, Urine: 1.025 (ref 1.005–1.030)
pH: 6 (ref 5.0–8.0)

## 2018-02-05 LAB — CBC
HCT: 35.3 % — ABNORMAL LOW (ref 36.0–46.0)
Hemoglobin: 11.7 g/dL — ABNORMAL LOW (ref 12.0–15.0)
MCH: 34.7 pg — AB (ref 26.0–34.0)
MCHC: 33.1 g/dL (ref 30.0–36.0)
MCV: 104.7 fL — AB (ref 78.0–100.0)
Platelets: 264 10*3/uL (ref 150–400)
RBC: 3.37 MIL/uL — ABNORMAL LOW (ref 3.87–5.11)
RDW: 13 % (ref 11.5–15.5)
WBC: 8.1 10*3/uL (ref 4.0–10.5)

## 2018-02-05 LAB — COMPREHENSIVE METABOLIC PANEL
ALK PHOS: 64 U/L (ref 38–126)
ALT: 30 U/L (ref 0–44)
AST: 50 U/L — AB (ref 15–41)
Albumin: 2.9 g/dL — ABNORMAL LOW (ref 3.5–5.0)
Anion gap: 15 (ref 5–15)
BILIRUBIN TOTAL: 1.7 mg/dL — AB (ref 0.3–1.2)
BUN: 14 mg/dL (ref 8–23)
CO2: 22 mmol/L (ref 22–32)
Calcium: 9.8 mg/dL (ref 8.9–10.3)
Chloride: 107 mmol/L (ref 98–111)
Creatinine, Ser: 1.06 mg/dL — ABNORMAL HIGH (ref 0.44–1.00)
GFR calc Af Amer: >60 mL/min (ref >60)
GFR, EST NON AFRICAN AMERICAN: 53 mL/min — AB (ref >60)
Glucose, Bld: 98 mg/dL (ref 70–99)
POTASSIUM: 3.2 mmol/L — AB (ref 3.5–5.1)
Sodium: 144 mmol/L (ref 135–145)
Total Protein: 6.6 g/dL (ref 6.5–8.1)

## 2018-02-05 LAB — I-STAT CG4 LACTIC ACID, ED
LACTIC ACID, VENOUS: 2.4 mmol/L — AB (ref 0.5–1.9)
LACTIC ACID, VENOUS: 2.39 mmol/L — AB (ref 0.5–1.9)

## 2018-02-05 LAB — LIPASE, BLOOD: Lipase: 27 U/L (ref 11–51)

## 2018-02-05 MED ORDER — SODIUM CHLORIDE 0.9 % IV BOLUS
1000.0000 mL | Freq: Once | INTRAVENOUS | Status: AC
  Administered 2018-02-05: 1000 mL via INTRAVENOUS

## 2018-02-05 MED ORDER — IOPAMIDOL (ISOVUE-370) INJECTION 76%
100.0000 mL | Freq: Once | INTRAVENOUS | Status: AC | PRN
  Administered 2018-02-05: 100 mL via INTRAVENOUS

## 2018-02-05 MED ORDER — ALUM & MAG HYDROXIDE-SIMETH 200-200-20 MG/5ML PO SUSP
15.0000 mL | Freq: Once | ORAL | Status: AC
  Administered 2018-02-05: 15 mL via ORAL
  Filled 2018-02-05: qty 30

## 2018-02-05 MED ORDER — IOPAMIDOL (ISOVUE-370) INJECTION 76%
INTRAVENOUS | Status: AC
  Filled 2018-02-05: qty 100

## 2018-02-05 NOTE — Discharge Instructions (Signed)
Try zantac or pepcid twice a day.  Try to avoid things that may make this worse, most commonly these are spicy foods tomato based products fatty foods chocolate and peppermint.  Alcohol and tobacco can also make this worse.  Return to the emergency department for sudden worsening pain fever or inability to eat or drink. ° °

## 2018-02-05 NOTE — ED Provider Notes (Addendum)
[facility] EMERGENCY DEPARTMENT Provider Note   CSN: [id number] Arrival date & time: 02/05/18  1412     History   Chief Complaint Chief Complaint  Patient presents with  . Abdominal Pain    HPI Maria Avila is a 67 y.o. female.  67 yo F with a chief complaint of abdominal pain.  This been a chronic issue for her.  Has been causing her to have decreased oral intake for at least the past couple years.  She denies fevers or chills denies significant worsening over the past week or so.  She had been seen in the ED recently and was referred to a family physician.  She saw them in the office today and they referred her here for a CT scan of the abdomen pelvis.  The patient had a recent CT scan over the past 6 months that was negative.  There has not been a significant change in her pain per the patient.  She still has trouble keeping food down.  Describes epigastric burning pain.  She had an endoscopy in the past that was negative for ulcers.  The history is provided by the patient.  Illness  This is a new problem. The current episode started more than 1 week ago. The problem occurs constantly. The problem has not changed since onset.Associated symptoms include abdominal pain. Pertinent negatives include no chest pain, no headaches and no shortness of breath. Nothing aggravates the symptoms. Nothing relieves the symptoms. She has tried nothing for the symptoms. The treatment provided no relief.    Past Medical History:  Diagnosis Date  . Hypertension     There are no active problems to display for this patient.   History reviewed. No pertinent surgical history.   OB History   None      Home Medications    Prior to Admission medications   Medication Sig Start Date End Date Taking? Authorizing Provider  aspirin EC 81 MG tablet Take 81 mg by mouth daily. 07/21/17  Yes [provider]  Cholecalciferol (VITAMIN D) 2000 units CAPS Take 1 capsule by mouth  daily. 07/21/17  Yes [provider]  lisinopril (PRINIVIL,ZESTRIL) 10 MG tablet Take 10 mg by mouth daily. 07/16/17  Yes [provider]  Multiple Vitamin (MULTIVITAMIN) capsule Take 1 capsule by mouth daily. 07/21/17  Yes [provider]  naproxen sodium (ALEVE) 220 MG tablet Take 220 mg by mouth 2 (two) times daily as needed. 07/21/17  Yes [provider]  Omega-3 Fatty Acids (FISH OIL) 1000 MG CAPS Take 1 capsule by mouth daily. 07/21/17  Yes [provider]  ondansetron (ZOFRAN ODT) 4 MG disintegrating tablet Take 1 tablet (4 mg total) by mouth every 6 (six) hours as needed. 02/01/18  Yes Ward, Kristen N, DO  pantoprazole (PROTONIX) 40 MG tablet Take 40 mg by mouth daily. 07/21/17  Yes [provider]  pravastatin (PRAVACHOL) 40 MG tablet Take 40 mg by mouth every evening. 12/20/17  Yes [provider]    Family History No family history on file.  Social History Social History   Tobacco Use  . Smoking status: Never Smoker  . Smokeless tobacco: Never Used  Substance Use Topics  . Alcohol use: Not Currently  . Drug use: Not Currently     Allergies   Crestor [rosuvastatin calcium] and Zocor [simvastatin]   Review of Systems Review of Systems  Constitutional: Negative for chills and fever.  HENT: Negative for congestion and rhinorrhea.  Eyes: Negative for redness and visual disturbance.  Respiratory: Negative for shortness of breath and wheezing.   Cardiovascular: Negative for chest pain and palpitations.  Gastrointestinal: Positive for abdominal pain and nausea. Negative for vomiting.  Genitourinary: Negative for dysuria and urgency.  Musculoskeletal: Negative for arthralgias and myalgias.  Skin: Negative for pallor and wound.  Neurological: Negative for dizziness and headaches.     Physical Exam Updated Vital Signs BP 127/81   Pulse 69   Temp 97.9 F (36.6 C) (Oral)   Resp 16   Ht 5\' 2"  (1.575 m)   Wt 48.1  kg   SpO2 98%   BMI 19.39 kg/m   Physical Exam  Constitutional: She is oriented to person, place, and time. She appears well-developed. No distress.  Chronically ill-appearing.  Temporal muscle wasting.  HENT:  Head: Normocephalic and atraumatic.  Eyes: Pupils are equal, round, and reactive to light. EOM are normal.  Neck: Normal range of motion. Neck supple.  Cardiovascular: Normal rate and regular rhythm. Exam reveals no gallop and no friction rub.  No murmur heard. Pulmonary/Chest: Effort normal. She has no wheezes. She has no rales.  Abdominal: Soft. She exhibits no distension and no mass. There is generalized tenderness (worst to the epigastrium). There is no guarding.  Musculoskeletal: She exhibits no edema or tenderness.  Neurological: She is alert and oriented to person, place, and time.  Skin: Skin is warm and dry. She is not diaphoretic.  Psychiatric: She has a normal mood and affect. Her behavior is normal.  Nursing note and vitals reviewed.    ED Treatments / Results  Labs (all labs ordered are listed, but only abnormal results are displayed) Labs Reviewed  COMPREHENSIVE METABOLIC PANEL - Abnormal; Notable for the following components:      Result Value   Potassium 3.2 (*)    Creatinine, Ser 1.06 (*)    Albumin 2.9 (*)    AST 50 (*)    Total Bilirubin 1.7 (*)    GFR calc non Af Amer 53 (*)    All other components within normal limits  CBC - Abnormal; Notable for the following components:   RBC 3.37 (*)    Hemoglobin 11.7 (*)    HCT 35.3 (*)    MCV 104.7 (*)    MCH 34.7 (*)    All other components within normal limits  URINALYSIS, ROUTINE W REFLEX MICROSCOPIC - Abnormal; Notable for the following components:   Color, Urine AMBER (*)    APPearance HAZY (*)    Bilirubin Urine SMALL (*)    Ketones, ur 20 (*)    Protein, ur 30 (*)    Leukocytes, UA TRACE (*)    Bacteria, UA RARE (*)    All other components within normal limits  I-STAT CG4 LACTIC ACID, ED -  Abnormal; Notable for the following components:   Lactic Acid, Venous 2.40 (*)    All other components within normal limits  I-STAT CG4 LACTIC ACID, ED - Abnormal; Notable for the following components:   Lactic Acid, Venous 2.39 (*)    All other components within normal limits  LIPASE, BLOOD    EKG EKG Interpretation  Date/Time:  Friday February 05 2018 15:18:59 EDT Ventricular Rate:  115 PR Interval:  176 QRS Duration: 72 QT Interval:  300 QTC Calculation: 415 R Axis:   21 Text Interpretation:  Sinus tachycardia Low voltage QRS Cannot rule out Anterior infarct , age undetermined ST & T wave abnormality, consider inferior ischemia Abnormal  ECG t wave flettening in lead II,  Otherwise no significant change Confirmed by Deno Etienne [phone]) on 02/05/2018 4:51:14 PM   Radiology Ct Angio Abd/pel W And/or Wo Contrast  Result Date: 02/05/2018 CLINICAL DATA:  Persistent acute abdominal pain, nausea, emesis and diarrhea EXAM: CT ANGIOGRAPHY ABDOMEN AND PELVIS WITH CONTRAST AND WITHOUT CONTRAST TECHNIQUE: Multidetector CT imaging of the abdomen and pelvis was performed using the standard protocol during bolus administration of intravenous contrast. Multiplanar reconstructed images and MIPs were obtained and reviewed to evaluate the vascular anatomy. CONTRAST:  161mL ISOVUE-370 IOPAMIDOL (ISOVUE-370) INJECTION 76% COMPARISON:  07/29/2017 FINDINGS: VASCULAR Aorta: Aorta is atherosclerotic and mildly ectatic, proximal aortic diameter is 2.8 cm in the suprarenal aorta. Negative for significant aneurysm, dissection, occlusive process, retroperitoneal hemorrhage/hematoma, or rupture. Celiac: Widely patent including its branches SMA: Widely patent including its branches Renals: Widely patent bilaterally.  No accessory renal artery. IMA: Remains patent off the distal aorta including its branches Inflow: Atherosclerotic and tortuous iliac vessels without inflow disease or occlusion. No iliac aneurysm or  dissection. The common, internal and external iliac arteries remain patent. Proximal Outflow: The common femoral, proximal profunda femoral, and superficial femoral arteries demonstrated are also widely patent. Veins: No veno-occlusive process. Review of the MIP images confirms the above findings. NON-VASCULAR Lower chest: No acute abnormality. Hepatobiliary: Diffuse hypoattenuation of the liver compatible with hepatic steatosis. No biliary dilatation or focal hepatic abnormality. Gallbladder mildly distended, nonspecific. Common bile duct nondilated. Pancreas: Unremarkable. No pancreatic ductal dilatation or surrounding inflammatory changes. Spleen: Normal in size without focal abnormality. Adrenals/Urinary Tract: Adrenal glands are unremarkable. Kidneys are normal, without renal calculi, focal lesion, or hydronephrosis. Bladder is unremarkable. Stomach/Bowel: Negative for bowel obstruction, significant dilatation, ileus, or free air. No fluid collection, abscess or ascites. Scattered colonic diverticulosis. Diffuse mild colonic wall thickening/edema suggesting nonspecific colitis. Appendix unremarkable. Lymphatic: No adenopathy. Reproductive: Uterus and bilateral adnexa are unremarkable. Other: No abdominal wall hernia or abnormality. No abdominopelvic ascites. Musculoskeletal: Degenerative changes of the spine. Multilevel facet arthropathy from L2-S1. Acute osseous finding. IMPRESSION: VASCULAR Aortoiliac atherosclerosis, mild ectasia and tortuosity without acute vascular process. Maximal diameter 2.8 cm as above. Ectatic abdominal aorta at risk for aneurysm development. Recommend followup by ultrasound in 5 years. This recommendation follows ACR consensus guidelines: White Paper of the ACR Incidental Findings Committee II on Vascular Findings. J Am Coll Radiol 2013; 10:789-794. Patent mesenteric and renal vasculature without occlusive disease Patent tortuous iliac vessels without inflow disease. NON-VASCULAR  Hepatic steatosis Diffuse colonic wall thickening/edema compatible with nonspecific colitis. Scattered minor diverticulosis as well. No fluid collection, abscess, perforation, obstruction pattern or ascites. Electronically Signed   By: Jerilynn Mages.  Shick M.D.   On: 02/05/2018 19:50   US Abdomen Limited Ruq  Result Date: 02/05/2018 CLINICAL DATA:  Abdominal pain. EXAM: ULTRASOUND ABDOMEN LIMITED RIGHT UPPER QUADRANT COMPARISON:  None. FINDINGS: Gallbladder: No gallstones or wall thickening visualized. No sonographic Murphy sign noted by sonographer. Common bile duct: Diameter: 6.9 mm Liver: Hepatic steatosis. No focal mass. Portal vein is patent on color Doppler imaging with normal direction of blood flow towards the liver. IMPRESSION: 1. The gallbladder is normal in appearance. 2. The common bile duct measures 6.9 mm which is borderline for age. Recommend correlation with labs. If there is concern for biliary obstruction, recommend an MRCP or ERCP. Electronically Signed   By: Dorise Bullion III M.D   On: 02/05/2018 21:23    Procedures Procedures (including critical care time)  Medications Ordered in ED Medications  iopamidol (ISOVUE-370) 76 % injection (has no administration in time range)  alum & mag hydroxide-simeth (MAALOX/MYLANTA) 200-200-20 MG/5ML suspension 15 mL (15 mLs Oral Given 02/05/18 1808)  sodium chloride 0.9 % bolus 1,000 mL (0 mLs Intravenous Stopped 02/05/18 2000)  iopamidol (ISOVUE-370) 76 % injection 100 mL (100 mLs Intravenous Contrast Given 02/05/18 1916)     Initial Impression / Assessment and Plan / ED Course  I have reviewed the triage vital signs and the nursing notes.  Pertinent labs & imaging results that were available during my care of the patient were reviewed by me and considered in my medical decision making (see chart for details).  Clinical Course as of Feb 05 2318  Fri Feb 05, 2018  1854 Patient's lactate is elevated, with her issues eating I am concerned about the  possibility of mesenteric ischemia.  We will change her CT to CT angiogram of the abdomen and pelvis.   [DF]    Clinical Course User Index [DF] Deno Etienne, DO    67 yo F with a chief complaint of epigastric abdominal pain.  This is a chronic issue for her going on for at least the past couple years.  She saw her family doctor today who sent her here for a CAT scan.  She had a CT scan in March of this year that was negative for acute process.  She has been seen by oncology for neutropenia.  Clinically the patient looks like she has cancer she has muscle wasting, she has some mild epigastric abdominal pain.  Will obtain CT.  CBC with hemoglobin likely a baseline of 11.7 no leukocytosis.  Her protein level is normal, she has a mild elevation of her total bilirubin as well as her AST which is unchanged from earlier this week.  Her creatinine is at baseline.  Lipase is normal.  The patient feels better after Maalox.  CT scan had nonspecific inflammation of the bowel.  I doubt that this is infectious based on the prolonged history.  We will hold off on antibiotics.  She does have a mildly elevated lactate level.  Her tachycardia has resolved.  She is tolerating by mouth.  Repeat abdominal exam with diffuse tenderness, but worst in the RUQ.  Will obtain US. [place] with borderline dilated CBD.  I discussed the case with Dr. Watt Climes, gastroenterology.  At this point with her prolonged course of abdominal pain he did not feel that based on my description of the patient that she need to be kept in the hospital for a MRCP.  He recommended that she follow-up with Dr. Therisa Doyne in the office Monday or Tuesday for evaluation.  At that point he felt that it would not be unreasonable to consider a outpatient HIDA scan versus a MRCP based on the patient's current picture.  11:19 PM:  I have discussed the diagnosis/risks/treatment options with the patient and family and believe the pt to be eligible for discharge home to follow-up  with PCP, GI. We also discussed returning to the ED immediately if new or worsening sx occur. We discussed the sx which are most concerning (e.g., sudden worsening pain, fever, inability to tolerate by mouth) that necessitate immediate return. Medications administered to the patient during their visit and any new prescriptions provided to the patient are listed below.  Medications given during this visit Medications  iopamidol (ISOVUE-370) 76 % injection (has no administration in time range)  alum & mag hydroxide-simeth (MAALOX/MYLANTA) 200-200-20 MG/5ML suspension 15 mL (15 mLs Oral  Given 02/05/18 1808)  sodium chloride 0.9 % bolus 1,000 mL (0 mLs Intravenous Stopped 02/05/18 2000)  iopamidol (ISOVUE-370) 76 % injection 100 mL (100 mLs Intravenous Contrast Given 02/05/18 1916)      The patient appears reasonably screen and/or stabilized for discharge and I doubt any other medical condition or other [facility] requiring further screening, evaluation, or treatment in the ED at this time prior to discharge.   Final Clinical Impressions(s) / ED Diagnoses   Final diagnoses:  Abdominal pain, RUQ  Colitis    ED Discharge Orders    None       Deno Etienne, DO 02/05/18 2319    Deno Etienne, DO 02/05/18 [place], DO 02/05/18 2320

## 2018-02-05 NOTE — ED Triage Notes (Addendum)
Onset 1-2 months ago developed general abdominal pain. Seen doctor today sent to the ED for evaluation and CT scan of her abdomen. 5/10 achy burning pain. States intermittent nausea, emesis, and diarrhea. Last BM one day ago loose brown stool

## 2018-02-05 NOTE — ED Notes (Signed)
RN attempted IV x 2 without success.

## 2018-02-05 NOTE — ED Provider Notes (Signed)
Patient placed in Quick Look pathway, seen and evaluated   Chief Complaint: Abdominal pain, nausea, vomiting  HPI:   Patient with 28-month history of intermittent epigastric abdominal pain.  Pain is burning, occasionally radiates to the left upper quadrant and left flank.  She states she has not been able to keep down anything for the past 2 months.  Has been following up with GI and PCP who sent her here for CT scan today.  Denies fevers, chest pain, shortness of breath.  Notes intermittent loose stools.  Seen and evaluated in the ED on Monday discharge with Zofran which she states has not been helpful.  ROS: Abdominal pain, nausea, vomiting, diarrhea  Physical Exam:   Gen: No distress  Neuro: Awake and Alert  Skin: Warm    Focused Exam: Tenderness to palpation of the epigastrium.  Active bowel sounds.  Tachycardic.  Lungs clear to auscultation.  Appears somewhat dehydrated.  No CVA tenderness.   Initiation of care has begun. The patient has been counseled on the process, plan, and necessity for staying for the completion/evaluation, and the remainder of the medical screening examination    Renita Papa, PA-C 02/05/18 1528    Orlie Dakin, MD 02/06/18 0030

## 2018-02-10 ENCOUNTER — Other Ambulatory Visit (HOSPITAL_COMMUNITY): Payer: Self-pay | Admitting: Physician Assistant

## 2018-02-10 DIAGNOSIS — R1010 Upper abdominal pain, unspecified: Secondary | ICD-10-CM

## 2018-02-10 DIAGNOSIS — R197 Diarrhea, unspecified: Secondary | ICD-10-CM | POA: Diagnosis not present

## 2018-02-10 DIAGNOSIS — R112 Nausea with vomiting, unspecified: Secondary | ICD-10-CM

## 2018-02-10 DIAGNOSIS — K838 Other specified diseases of biliary tract: Secondary | ICD-10-CM | POA: Diagnosis not present

## 2018-02-11 DIAGNOSIS — R197 Diarrhea, unspecified: Secondary | ICD-10-CM | POA: Diagnosis not present

## 2018-02-13 ENCOUNTER — Emergency Department (HOSPITAL_COMMUNITY)
Admission: EM | Admit: 2018-02-13 | Discharge: 2018-02-13 | Disposition: A | Discharge status: Home / Self Care | Payer: Medicare Other | Attending: Emergency Medicine | Admitting: Emergency Medicine

## 2018-02-13 ENCOUNTER — Encounter (HOSPITAL_COMMUNITY): Payer: Self-pay | Admitting: Emergency Medicine

## 2018-02-13 DIAGNOSIS — Z79899 Other long term (current) drug therapy: Secondary | ICD-10-CM | POA: Insufficient documentation

## 2018-02-13 DIAGNOSIS — E860 Dehydration: Secondary | ICD-10-CM | POA: Diagnosis not present

## 2018-02-13 DIAGNOSIS — I10 Essential (primary) hypertension: Secondary | ICD-10-CM | POA: Insufficient documentation

## 2018-02-13 DIAGNOSIS — Z7982 Long term (current) use of aspirin: Secondary | ICD-10-CM | POA: Insufficient documentation

## 2018-02-13 DIAGNOSIS — R109 Unspecified abdominal pain: Secondary | ICD-10-CM | POA: Diagnosis present

## 2018-02-13 LAB — CBC
HEMATOCRIT: 34.9 % — AB (ref 36.0–46.0)
Hemoglobin: 12.1 g/dL (ref 12.0–15.0)
MCH: 34.1 pg — ABNORMAL HIGH (ref 26.0–34.0)
MCHC: 34.7 g/dL (ref 30.0–36.0)
MCV: 98.3 fL (ref 80.0–100.0)
PLATELETS: 188 10*3/uL (ref 150–400)
RBC: 3.55 MIL/uL — ABNORMAL LOW (ref 3.87–5.11)
RDW: 12.5 % (ref 11.5–15.5)
WBC: 6.9 10*3/uL (ref 4.0–10.5)
nRBC: 0 % (ref 0.0–0.2)

## 2018-02-13 LAB — COMPREHENSIVE METABOLIC PANEL
ALK PHOS: 60 U/L (ref 38–126)
ALT: 19 U/L (ref 0–44)
AST: 28 U/L (ref 15–41)
Albumin: 2.7 g/dL — ABNORMAL LOW (ref 3.5–5.0)
Anion gap: 15 (ref 5–15)
BILIRUBIN TOTAL: 1.5 mg/dL — AB (ref 0.3–1.2)
BUN: 17 mg/dL (ref 8–23)
CALCIUM: 9.2 mg/dL (ref 8.9–10.3)
CHLORIDE: 98 mmol/L (ref 98–111)
CO2: 23 mmol/L (ref 22–32)
CREATININE: 0.79 mg/dL (ref 0.44–1.00)
Glucose, Bld: 99 mg/dL (ref 70–99)
Potassium: 3.1 mmol/L — ABNORMAL LOW (ref 3.5–5.1)
Sodium: 136 mmol/L (ref 135–145)
TOTAL PROTEIN: 6.6 g/dL (ref 6.5–8.1)

## 2018-02-13 LAB — LIPASE, BLOOD: Lipase: 40 U/L (ref 11–51)

## 2018-02-13 MED ORDER — SODIUM CHLORIDE 0.9 % IV BOLUS
1000.0000 mL | Freq: Once | INTRAVENOUS | Status: AC
  Administered 2018-02-13: 1000 mL via INTRAVENOUS

## 2018-02-13 MED ORDER — SODIUM CHLORIDE 0.9 % IV SOLN
INTRAVENOUS | Status: DC
  Administered 2018-02-13: 18:00:00 via INTRAVENOUS

## 2018-02-13 MED ORDER — POTASSIUM CHLORIDE CRYS ER 20 MEQ PO TBCR
40.0000 meq | EXTENDED_RELEASE_TABLET | Freq: Once | ORAL | Status: AC
  Administered 2018-02-13: 40 meq via ORAL
  Filled 2018-02-13: qty 2

## 2018-02-13 MED ORDER — LORAZEPAM 2 MG/ML IJ SOLN
0.5000 mg | Freq: Once | INTRAMUSCULAR | Status: AC
  Administered 2018-02-13: 0.5 mg via INTRAVENOUS
  Filled 2018-02-13: qty 1

## 2018-02-13 MED ORDER — ONDANSETRON HCL 4 MG/2ML IJ SOLN
4.0000 mg | Freq: Once | INTRAMUSCULAR | Status: AC
  Administered 2018-02-13: 4 mg via INTRAVENOUS
  Filled 2018-02-13: qty 2

## 2018-02-13 NOTE — ED Triage Notes (Signed)
Pt presents to ED for assessment of LUQ pain, 1-2 months, with emesis after eating, diarrhea.  Due to not being able to keep anything down, patient is feeling weak due to low oral intake.  Pt's husband states "same as it's always been.  She's weak again from not eating".  Pt has had follow up with [facility], with HIDA scan ordered for Tuesday.

## 2018-02-13 NOTE — ED Provider Notes (Signed)
[facility] EMERGENCY DEPARTMENT Provider Note   CSN: [id number] Arrival date & time: 02/13/18  1630     History   Chief Complaint Chief Complaint  Patient presents with  . Abdominal Pain  . Emesis    HPI Maria Avila is a 67 y.o. female.  67 year old female with history of chronic abdominal pain being followed by [facility] who presents with increased weakness associated with decreased oral intake.  Her abdominal pain is right upper quadrant and no change from her prior studies.  Symptoms are worse after she eats.  No associated emesis today but some nausea.  No fever or diarrhea.  Denies any urinary symptoms.  Weakness is nonfocal and worse with any kind of activity.  No treatment used prior to arrival.     Past Medical History:  Diagnosis Date  . Hypertension     There are no active problems to display for this patient.   History reviewed. No pertinent surgical history.   OB History   None      Home Medications    Prior to Admission medications   Medication Sig Start Date End Date Taking? Authorizing Provider  aspirin EC 81 MG tablet Take 81 mg by mouth daily. 07/21/17   [provider]  Cholecalciferol (VITAMIN D) 2000 units CAPS Take 1 capsule by mouth daily. 07/21/17   [provider]  lisinopril (PRINIVIL,ZESTRIL) 10 MG tablet Take 10 mg by mouth daily. 07/16/17   [provider]  Multiple Vitamin (MULTIVITAMIN) capsule Take 1 capsule by mouth daily. 07/21/17   [provider]  naproxen sodium (ALEVE) 220 MG tablet Take 220 mg by mouth 2 (two) times daily as needed. 07/21/17   [provider]  Omega-3 Fatty Acids (FISH OIL) 1000 MG CAPS Take 1 capsule by mouth daily. 07/21/17   [provider]  ondansetron (ZOFRAN ODT) 4 MG disintegrating tablet Take 1 tablet (4 mg total) by mouth every 6 (six) hours as needed. 02/01/18   Ward, Delice Bison, DO  pantoprazole (PROTONIX) 40 MG tablet Take 40 mg by  mouth daily. 07/21/17   [provider]  pravastatin (PRAVACHOL) 40 MG tablet Take 40 mg by mouth every evening. 12/20/17   [provider]    Family History History reviewed. No pertinent family history.  Social History Social History   Tobacco Use  . Smoking status: Never Smoker  . Smokeless tobacco: Never Used  Substance Use Topics  . Alcohol use: Not Currently  . Drug use: Not Currently     Allergies   Crestor [rosuvastatin calcium] and Zocor [simvastatin]   Review of Systems Review of Systems  All other systems reviewed and are negative.    Physical Exam Updated Vital Signs BP 103/89 (BP Location: Right Arm)   Pulse (!) 115   Temp 97.9 F (36.6 C) (Oral)   SpO2 98%   Physical Exam  Constitutional: She is oriented to person, place, and time. She appears well-developed and well-nourished. She appears cachectic.  Non-toxic appearance. No distress.  HENT:  Head: Normocephalic and atraumatic.  Eyes: Pupils are equal, round, and reactive to light. Conjunctivae, EOM and lids are normal.  Neck: Normal range of motion. Neck supple. No tracheal deviation present. No thyroid mass present.  Cardiovascular: Normal rate, regular rhythm and normal heart sounds. Exam reveals no gallop.  No murmur heard. Pulmonary/Chest: Effort normal and breath sounds normal. No stridor. No respiratory distress. She has no decreased breath sounds. She has no wheezes.  She has no rhonchi. She has no rales.  Abdominal: Soft. Normal appearance and bowel sounds are normal. She exhibits no distension. There is tenderness in the right upper quadrant and epigastric area. There is no rigidity, no rebound, no guarding and no CVA tenderness.    Musculoskeletal: Normal range of motion. She exhibits no edema or tenderness.  Neurological: She is oriented to person, place, and time. She has normal strength. No cranial nerve deficit or sensory deficit. GCS eye subscore is 4. GCS verbal subscore  is 5. GCS motor subscore is 6.  Skin: Skin is warm and dry. No abrasion and no rash noted.  Psychiatric: She has a normal mood and affect. Her speech is normal and behavior is normal.  Nursing note and vitals reviewed.    ED Treatments / Results  Labs (all labs ordered are listed, but only abnormal results are displayed) Labs Reviewed  LIPASE, BLOOD  COMPREHENSIVE METABOLIC PANEL  CBC    EKG None  Radiology No results found.  Procedures Procedures (including critical care time)  Medications Ordered in ED Medications  sodium chloride 0.9 % bolus 1,000 mL (has no administration in time range)  0.9 %  sodium chloride infusion (has no administration in time range)  ondansetron (ZOFRAN) injection 4 mg (has no administration in time range)  LORazepam (ATIVAN) injection 0.5 mg (has no administration in time range)     Initial Impression / Assessment and Plan / ED Course  I have reviewed the triage vital signs and the nursing notes.  Pertinent labs & imaging results that were available during my care of the patient were reviewed by me and considered in my medical decision making (see chart for details).     Patient given IV fluids here and feels better.  Patient also treated with antiemetics and her nausea has improved.  Good response with her heart rate.  Symptoms likely from dehydration and patient will keep her follow-up with GI next week  Final Clinical Impressions(s) / ED Diagnoses   Final diagnoses:  None    ED Discharge Orders    None       Lacretia Leigh, MD 02/13/18 2021

## 2018-02-16 ENCOUNTER — Observation Stay (HOSPITAL_COMMUNITY): Payer: Medicare Other

## 2018-02-16 ENCOUNTER — Encounter (HOSPITAL_COMMUNITY): Payer: Self-pay

## 2018-02-16 ENCOUNTER — Inpatient Hospital Stay (HOSPITAL_COMMUNITY)
Admission: EM | Admit: 2018-02-16 | Discharge: 2018-02-27 | DRG: 640 | Disposition: A | Discharge status: Skilled Nursing Facility | Payer: Medicare Other | Attending: Internal Medicine | Admitting: Internal Medicine

## 2018-02-16 ENCOUNTER — Encounter (HOSPITAL_COMMUNITY): Payer: Medicare Other

## 2018-02-16 ENCOUNTER — Other Ambulatory Visit: Payer: Self-pay

## 2018-02-16 DIAGNOSIS — I10 Essential (primary) hypertension: Secondary | ICD-10-CM | POA: Diagnosis present

## 2018-02-16 DIAGNOSIS — I959 Hypotension, unspecified: Secondary | ICD-10-CM | POA: Diagnosis not present

## 2018-02-16 DIAGNOSIS — K529 Noninfective gastroenteritis and colitis, unspecified: Secondary | ICD-10-CM | POA: Insufficient documentation

## 2018-02-16 DIAGNOSIS — E785 Hyperlipidemia, unspecified: Secondary | ICD-10-CM | POA: Diagnosis present

## 2018-02-16 DIAGNOSIS — K5730 Diverticulosis of large intestine without perforation or abscess without bleeding: Secondary | ICD-10-CM | POA: Diagnosis not present

## 2018-02-16 DIAGNOSIS — K2900 Acute gastritis without bleeding: Secondary | ICD-10-CM | POA: Diagnosis present

## 2018-02-16 DIAGNOSIS — R112 Nausea with vomiting, unspecified: Secondary | ICD-10-CM | POA: Diagnosis present

## 2018-02-16 DIAGNOSIS — Z888 Allergy status to other drugs, medicaments and biological substances status: Secondary | ICD-10-CM

## 2018-02-16 DIAGNOSIS — G319 Degenerative disease of nervous system, unspecified: Secondary | ICD-10-CM | POA: Diagnosis not present

## 2018-02-16 DIAGNOSIS — E119 Type 2 diabetes mellitus without complications: Secondary | ICD-10-CM

## 2018-02-16 DIAGNOSIS — R4781 Slurred speech: Secondary | ICD-10-CM | POA: Diagnosis not present

## 2018-02-16 DIAGNOSIS — D649 Anemia, unspecified: Secondary | ICD-10-CM | POA: Diagnosis not present

## 2018-02-16 DIAGNOSIS — L89302 Pressure ulcer of unspecified buttock, stage 2: Secondary | ICD-10-CM | POA: Diagnosis not present

## 2018-02-16 DIAGNOSIS — Z23 Encounter for immunization: Secondary | ICD-10-CM

## 2018-02-16 DIAGNOSIS — R531 Weakness: Secondary | ICD-10-CM | POA: Diagnosis not present

## 2018-02-16 DIAGNOSIS — Z820 Family history of epilepsy and other diseases of the nervous system: Secondary | ICD-10-CM

## 2018-02-16 DIAGNOSIS — F329 Major depressive disorder, single episode, unspecified: Secondary | ICD-10-CM | POA: Diagnosis present

## 2018-02-16 DIAGNOSIS — K760 Fatty (change of) liver, not elsewhere classified: Secondary | ICD-10-CM | POA: Diagnosis not present

## 2018-02-16 DIAGNOSIS — D509 Iron deficiency anemia, unspecified: Secondary | ICD-10-CM | POA: Diagnosis not present

## 2018-02-16 DIAGNOSIS — E860 Dehydration: Principal | ICD-10-CM | POA: Diagnosis present

## 2018-02-16 DIAGNOSIS — Z800 Family history of malignant neoplasm of digestive organs: Secondary | ICD-10-CM

## 2018-02-16 DIAGNOSIS — D61818 Other pancytopenia: Secondary | ICD-10-CM | POA: Diagnosis not present

## 2018-02-16 DIAGNOSIS — Z87891 Personal history of nicotine dependence: Secondary | ICD-10-CM

## 2018-02-16 DIAGNOSIS — R109 Unspecified abdominal pain: Secondary | ICD-10-CM | POA: Diagnosis not present

## 2018-02-16 DIAGNOSIS — K640 First degree hemorrhoids: Secondary | ICD-10-CM | POA: Diagnosis not present

## 2018-02-16 DIAGNOSIS — D696 Thrombocytopenia, unspecified: Secondary | ICD-10-CM | POA: Diagnosis not present

## 2018-02-16 DIAGNOSIS — Z7982 Long term (current) use of aspirin: Secondary | ICD-10-CM

## 2018-02-16 DIAGNOSIS — E43 Unspecified severe protein-calorie malnutrition: Secondary | ICD-10-CM | POA: Diagnosis present

## 2018-02-16 DIAGNOSIS — R197 Diarrhea, unspecified: Secondary | ICD-10-CM | POA: Diagnosis not present

## 2018-02-16 DIAGNOSIS — D72829 Elevated white blood cell count, unspecified: Secondary | ICD-10-CM

## 2018-02-16 DIAGNOSIS — Z681 Body mass index (BMI) 19 or less, adult: Secondary | ICD-10-CM | POA: Diagnosis not present

## 2018-02-16 DIAGNOSIS — K449 Diaphragmatic hernia without obstruction or gangrene: Secondary | ICD-10-CM | POA: Diagnosis not present

## 2018-02-16 DIAGNOSIS — E440 Moderate protein-calorie malnutrition: Secondary | ICD-10-CM

## 2018-02-16 DIAGNOSIS — E876 Hypokalemia: Secondary | ICD-10-CM | POA: Diagnosis not present

## 2018-02-16 DIAGNOSIS — R627 Adult failure to thrive: Secondary | ICD-10-CM | POA: Diagnosis present

## 2018-02-16 DIAGNOSIS — E0800 Diabetes mellitus due to underlying condition with hyperosmolarity without nonketotic hyperglycemic-hyperosmolar coma (NKHHC): Secondary | ICD-10-CM | POA: Diagnosis not present

## 2018-02-16 HISTORY — DX: Noninfective gastroenteritis and colitis, unspecified: K52.9

## 2018-02-16 HISTORY — DX: Unspecified protein-calorie malnutrition: E46

## 2018-02-16 HISTORY — DX: Type 2 diabetes mellitus without complications: E11.9

## 2018-02-16 LAB — BASIC METABOLIC PANEL
Anion gap: 13 (ref 5–15)
BUN: 12 mg/dL (ref 8–23)
CALCIUM: 9 mg/dL (ref 8.9–10.3)
CHLORIDE: 101 mmol/L (ref 98–111)
CO2: 24 mmol/L (ref 22–32)
CREATININE: 0.71 mg/dL (ref 0.44–1.00)
GFR calc Af Amer: >60 mL/min (ref >60)
GLUCOSE: 106 mg/dL — AB (ref 70–99)
POTASSIUM: 3.4 mmol/L — AB (ref 3.5–5.1)
Sodium: 138 mmol/L (ref 135–145)

## 2018-02-16 LAB — AMMONIA: Ammonia: 23 umol/L (ref 9–35)

## 2018-02-16 LAB — CBC
HCT: 32.2 % — ABNORMAL LOW (ref 36.0–46.0)
Hemoglobin: 10.9 g/dL — ABNORMAL LOW (ref 12.0–15.0)
MCH: 33.2 pg (ref 26.0–34.0)
MCHC: 33.9 g/dL (ref 30.0–36.0)
MCV: 98.2 fL (ref 80.0–100.0)
NRBC: 0 % (ref 0.0–0.2)
PLATELETS: 169 10*3/uL (ref 150–400)
RBC: 3.28 MIL/uL — ABNORMAL LOW (ref 3.87–5.11)
RDW: 12.5 % (ref 11.5–15.5)
WBC: 4.3 10*3/uL (ref 4.0–10.5)

## 2018-02-16 LAB — CBG MONITORING, ED: Glucose-Capillary: 80 mg/dL (ref 70–99)

## 2018-02-16 LAB — PROTIME-INR
INR: 1.25
PROTHROMBIN TIME: 15.6 s — AB (ref 11.4–15.2)

## 2018-02-16 LAB — HEPATIC FUNCTION PANEL
ALT: 19 U/L (ref 0–44)
AST: 28 U/L (ref 15–41)
Albumin: 2.4 g/dL — ABNORMAL LOW (ref 3.5–5.0)
Alkaline Phosphatase: 56 U/L (ref 38–126)
BILIRUBIN DIRECT: 0.4 mg/dL — AB (ref 0.0–0.2)
BILIRUBIN TOTAL: 1.3 mg/dL — AB (ref 0.3–1.2)
Indirect Bilirubin: 0.9 mg/dL (ref 0.3–0.9)
Total Protein: 5.7 g/dL — ABNORMAL LOW (ref 6.5–8.1)

## 2018-02-16 LAB — LIPASE, BLOOD: Lipase: 44 U/L (ref 11–51)

## 2018-02-16 LAB — MAGNESIUM: Magnesium: 1.3 mg/dL — ABNORMAL LOW (ref 1.7–2.4)

## 2018-02-16 LAB — LACTIC ACID, PLASMA: LACTIC ACID, VENOUS: 1.7 mmol/L (ref 0.5–1.9)

## 2018-02-16 MED ORDER — POTASSIUM CHLORIDE CRYS ER 20 MEQ PO TBCR
40.0000 meq | EXTENDED_RELEASE_TABLET | Freq: Once | ORAL | Status: AC
  Administered 2018-02-16: 40 meq via ORAL
  Filled 2018-02-16: qty 2

## 2018-02-16 MED ORDER — ONDANSETRON HCL 4 MG PO TABS: 4.0000 mg | ORAL_TABLET | Freq: Four times a day (QID) | ORAL | Status: DC | PRN

## 2018-02-16 MED ORDER — ASPIRIN EC 81 MG PO TBEC
81.0000 mg | DELAYED_RELEASE_TABLET | Freq: Every day | ORAL | Status: DC
  Administered 2018-02-16 – 2018-02-19 (×4): 81 mg via ORAL
  Filled 2018-02-16 (×4): qty 1

## 2018-02-16 MED ORDER — LISINOPRIL 10 MG PO TABS
10.0000 mg | ORAL_TABLET | Freq: Every day | ORAL | Status: DC
Start: 2018-02-16 — End: 2018-02-19
  Administered 2018-02-16 – 2018-02-18 (×3): 10 mg via ORAL
  Filled 2018-02-16 (×3): qty 1

## 2018-02-16 MED ORDER — PANCRELIPASE (LIP-PROT-AMYL) 12000-38000 UNITS PO CPEP
24000.0000 [IU] | ORAL_CAPSULE | Freq: Three times a day (TID) | ORAL | Status: DC
  Administered 2018-02-16 – 2018-02-27 (×25): 24000 [IU] via ORAL
  Filled 2018-02-16 (×24): qty 2

## 2018-02-16 MED ORDER — ONDANSETRON HCL 4 MG/2ML IJ SOLN: 4.0000 mg | Freq: Three times a day (TID) | INTRAMUSCULAR | Status: DC | PRN

## 2018-02-16 MED ORDER — SODIUM CHLORIDE 0.9 % IV BOLUS: 1000.0000 mL | Freq: Once | INTRAVENOUS | Status: DC

## 2018-02-16 MED ORDER — MAGNESIUM SULFATE 2 GM/50ML IV SOLN
2.0000 g | Freq: Once | INTRAVENOUS | Status: AC
  Administered 2018-02-16: 2 g via INTRAVENOUS
  Filled 2018-02-16: qty 50

## 2018-02-16 MED ORDER — MORPHINE SULFATE (PF) 2 MG/ML IV SOLN: 1.0000 mg | INTRAVENOUS | Status: DC | PRN

## 2018-02-16 MED ORDER — ENSURE ENLIVE PO LIQD
237.0000 mL | Freq: Two times a day (BID) | ORAL | Status: DC
  Administered 2018-02-16 – 2018-02-17 (×3): 237 mL via ORAL

## 2018-02-16 MED ORDER — PRAVASTATIN SODIUM 40 MG PO TABS
40.0000 mg | ORAL_TABLET | Freq: Every evening | ORAL | Status: DC
  Administered 2018-02-16 – 2018-02-26 (×11): 40 mg via ORAL
  Filled 2018-02-16 (×11): qty 1

## 2018-02-16 MED ORDER — INFLUENZA VAC SPLIT HIGH-DOSE 0.5 ML IM SUSY
0.5000 mL | PREFILLED_SYRINGE | INTRAMUSCULAR | Status: AC
  Administered 2018-02-18: 0.5 mL via INTRAMUSCULAR
  Filled 2018-02-16 (×2): qty 0.5

## 2018-02-16 MED ORDER — SODIUM CHLORIDE 0.9 % IV SOLN
INTRAVENOUS | Status: DC
  Administered 2018-02-16 – 2018-02-19 (×4): via INTRAVENOUS

## 2018-02-16 MED ORDER — ORAL CARE MOUTH RINSE
15.0000 mL | Freq: Two times a day (BID) | OROMUCOSAL | Status: DC
  Administered 2018-02-16 – 2018-02-27 (×17): 15 mL via OROMUCOSAL

## 2018-02-16 MED ORDER — PIPERACILLIN-TAZOBACTAM 3.375 G IVPB 30 MIN
3.3750 g | Freq: Once | INTRAVENOUS | Status: AC
  Administered 2018-02-16: 3.375 g via INTRAVENOUS
  Filled 2018-02-16: qty 50

## 2018-02-16 MED ORDER — PANTOPRAZOLE SODIUM 40 MG PO TBEC
40.0000 mg | DELAYED_RELEASE_TABLET | Freq: Every day | ORAL | Status: DC
  Administered 2018-02-16 – 2018-02-27 (×11): 40 mg via ORAL
  Filled 2018-02-16 (×11): qty 1

## 2018-02-16 MED ORDER — ONDANSETRON HCL 4 MG/2ML IJ SOLN: 4.0000 mg | Freq: Four times a day (QID) | INTRAMUSCULAR | Status: DC | PRN

## 2018-02-16 MED ORDER — ENOXAPARIN SODIUM 30 MG/0.3ML ~~LOC~~ SOLN
30.0000 mg | SUBCUTANEOUS | Status: DC
  Administered 2018-02-16 – 2018-02-19 (×4): 30 mg via SUBCUTANEOUS
  Filled 2018-02-16 (×4): qty 0.3

## 2018-02-16 MED ORDER — SODIUM CHLORIDE 0.9 % IV BOLUS (SEPSIS)
1000.0000 mL | Freq: Once | INTRAVENOUS | Status: AC
  Administered 2018-02-16: 1000 mL via INTRAVENOUS

## 2018-02-16 MED ORDER — LORAZEPAM 1 MG PO TABS
1.0000 mg | ORAL_TABLET | Freq: Once | ORAL | Status: AC | PRN
  Administered 2018-02-16: 1 mg via ORAL
  Filled 2018-02-16: qty 1

## 2018-02-16 NOTE — Consult Note (Signed)
[facility] Gastroenterology Consultation Note  Referring Provider: Dr. Norina Buzzard [facility]) Primary Care Physician:  [name], MD Primary Gastroenterologist:  Dr. Ronnette Juniper  Reason for Consultation:  Weight loss  HPI: Maria Avila is a 67 y.o. female with nearly one year of symptoms including:  Nausea, vomiting, anorexia, 40 lb weight loss, diarrhea.  Extensive evaluation:  Endoscopy, multiple CT scans, U/S, labs, all unrevealing.  No blood in stool.  Prior heavy alcohol but no alcohol now for several months.   Past Medical History:  Diagnosis Date  . Colitis   . Diabetes mellitus without complication ([place])   . Hypertension     Past Surgical History:  Procedure Laterality Date  . NO PAST SURGERIES     UNCERTAIN OF NAME OF SURGERY    Prior to Admission medications   Medication Sig Start Date End Date Taking? Authorizing Provider  aspirin EC 81 MG tablet Take 81 mg by mouth daily. 07/21/17  Yes [provider]  Cholecalciferol (VITAMIN D) 2000 units CAPS Take 1 capsule by mouth daily. 07/21/17  Yes [provider]  lisinopril (PRINIVIL,ZESTRIL) 10 MG tablet Take 10 mg by mouth daily. 07/16/17  Yes [provider]  Multiple Vitamin (MULTIVITAMIN) capsule Take 1 capsule by mouth daily. 07/21/17  Yes [provider]  naproxen sodium (ALEVE) 220 MG tablet Take 220 mg by mouth 2 (two) times daily as needed. 07/21/17  Yes [provider]  Omega-3 Fatty Acids (FISH OIL) 1000 MG CAPS Take 1 capsule by mouth daily. 07/21/17  Yes [provider]  ondansetron (ZOFRAN ODT) 4 MG disintegrating tablet Take 1 tablet (4 mg total) by mouth every 6 (six) hours as needed. 02/01/18  Yes Ward, Kristen N, DO  pantoprazole (PROTONIX) 40 MG tablet Take 40 mg by mouth daily. 07/21/17  Yes [provider]  pravastatin (PRAVACHOL) 40 MG tablet Take 40 mg by mouth every evening. 12/20/17  Yes [provider]  promethazine (PHENERGAN) 12.5 MG tablet  Take 12.5 mg by mouth every 6 (six) hours as needed for nausea. 02/10/18  Yes [provider]    Current Facility-Administered Medications  Medication Dose Route Frequency Provider Last Rate Last Dose  . 0.9 %  sodium chloride infusion   Intravenous Continuous Edwin Dada, MD 100 mL/hr at 02/16/18 0514    . aspirin EC tablet 81 mg  81 mg Oral Daily Danford, Suann Larry, MD   81 mg at 02/16/18 0900  . enoxaparin (LOVENOX) injection 30 mg  30 mg Subcutaneous Q24H Danford, Suann Larry, MD   30 mg at 02/16/18 0900  . feeding supplement (ENSURE ENLIVE) (ENSURE ENLIVE) liquid 237 mL  237 mL Oral BID BM Danford, Suann Larry, MD      . Derrill Memo ON 02/17/2018] Influenza vac split quadrivalent PF (FLUZONE HIGH-DOSE) injection 0.5 mL  0.5 mL Intramuscular Tomorrow-1000 Danford, Suann Larry, MD      . lisinopril (PRINIVIL,ZESTRIL) tablet 10 mg  10 mg Oral Daily Danford, Suann Larry, MD   10 mg at 02/16/18 0901  . magnesium sulfate IVPB 2 g 50 mL  2 g Intravenous Once Danford, Suann Larry, MD      . MEDLINE mouth rinse  15 mL Mouth Rinse BID Danford, Suann Larry, MD      . ondansetron (ZOFRAN) tablet 4 mg  4 mg Oral Q6H PRN Danford, Suann Larry, MD       Or  . ondansetron (ZOFRAN) injection 4 mg  4 mg Intravenous Q6H PRN Danford,  Suann Larry, MD      . pantoprazole (PROTONIX) EC tablet 40 mg  40 mg Oral Daily Danford, Suann Larry, MD   40 mg at 02/16/18 0900  . pravastatin (PRAVACHOL) tablet 40 mg  40 mg Oral QPM Danford, Suann Larry, MD        Allergies as of 02/16/2018 - Review Complete 02/16/2018  Allergen Reaction Noted  . Crestor [rosuvastatin calcium] Swelling and Other (See Comments) 07/21/2017  . Zocor [simvastatin] Swelling and Other (See Comments) 07/21/2017    Family History  Problem Relation Age of Onset  . Alzheimer's disease Mother   . Colon cancer Father     Social History   Socioeconomic History  . Marital status: Married    Spouse name:  Not on file  . Number of children: Not on file  . Years of education: Not on file  . Highest education level: Not on file  Occupational History  . Not on file  Social Needs  . Financial resource strain: Not on file  . Food insecurity:    Worry: Not on file    Inability: Not on file  . Transportation needs:    Medical: Not on file    Non-medical: Not on file  Tobacco Use  . Smoking status: Former Research scientist (life sciences)  . Smokeless tobacco: Never Used  Substance and Sexual Activity  . Alcohol use: Not Currently  . Drug use: Not Currently  . Sexual activity: Not on file  Lifestyle  . Physical activity:    Days per week: Not on file    Minutes per session: Not on file  . Stress: Not on file  Relationships  . Social connections:    Talks on phone: Not on file    Gets together: Not on file    Attends religious service: Not on file    Active member of club or organization: Not on file    Attends meetings of clubs or organizations: Not on file    Relationship status: Not on file  . Intimate partner violence:    Fear of current or ex partner: Not on file    Emotionally abused: Not on file    Physically abused: Not on file    Forced sexual activity: Not on file  Other Topics Concern  . Not on file  Social History Narrative  . Not on file    Review of Systems: As per HPI, all others negative  Physical Exam: Vital signs in last 24 hours: Temp:  [98 F (36.7 C)-98.6 F (37 C)] 98.1 F (36.7 C) (10/15 0857) Pulse Rate:  [93-117] 102 (10/15 0857) Resp:  [16-22] 18 (10/15 0857) BP: (103-134)/(81-92) 128/92 (10/15 0857) SpO2:  [100 %] 100 % (10/15 0857) Weight:  [48.7 kg] 48.7 kg (10/15 0648)   General:   Flat affect, avoids eye contact Head:  Normocephalic and atraumatic. Eyes:  Sclera clear, no icterus.   Conjunctiva pink. Ears:  Normal auditory acuity. Nose:  No deformity, discharge,  or lesions. Mouth:  No deformity or lesions.  Oropharynx pink & moist. Neck:  Supple; no masses  or thyromegaly. Lungs:  Clear throughout to auscultation.   No wheezes, crackles, or rhonchi. No acute distress. Heart:  Regular rate and rhythm; no murmurs, clicks, rubs,  or gallops. Abdomen:  Soft, non distended, non-tender at present No masses, hepatosplenomegaly or hernias noted. Normal bowel sounds, without guarding, and without rebound.     Msk:  Symmetrical without gross deformities. Normal posture. Pulses:  Normal pulses noted.  Extremities:  Without clubbing or edema. Neurologic:  Alert and  oriented x4; diffusely weak without lateralizing signs; grossly normal neurologically. Skin:  Intact without significant lesions or rashes. Psych:  Depressed mood, flat affect   Lab Results: Recent Labs    02/13/18 1801 02/16/18 0232  WBC 6.9 4.3  HGB 12.1 10.9*  HCT 34.9* 32.2*  PLT 188 169   BMET Recent Labs    02/13/18 1801 02/16/18 0232  NA 136 138  K 3.1* 3.4*  CL 98 101  CO2 23 24  GLUCOSE 99 106*  BUN 17 12  CREATININE 0.79 0.71  CALCIUM 9.2 9.0   LFT Recent Labs    02/16/18 0459  PROT 5.7*  ALBUMIN 2.4*  AST 28  ALT 19  ALKPHOS 56  BILITOT 1.3*  BILIDIR 0.4*  IBILI 0.9   PT/INR Recent Labs    02/16/18 0951  LABPROT 15.6*  INR 1.25    Studies/Results: Ct Head Wo Contrast  Result Date: 02/16/2018 CLINICAL DATA:  Generalize weakness and slurred speech for 2 weeks. EXAM: CT HEAD WITHOUT CONTRAST TECHNIQUE: Contiguous axial images were obtained from the base of the skull through the vertex without intravenous contrast. COMPARISON:  04/01/2005 FINDINGS: Brain: No evidence of acute infarction, hemorrhage, hydrocephalus, extra-axial collection or mass lesion/mass effect. Vascular: Mild intracranial arterial vascular calcifications. Skull: Calvarium appears intact. Sinuses/Orbits: Paranasal sinuses and mastoid air cells are clear. Other: None. IMPRESSION: No acute intracranial abnormality. Electronically Signed   By: Lucienne Capers M.D.   On: 02/16/2018 06:24     Impression:  1.  Progressive anorexia and weight loss. 2.  Abdominal pain with nausea/vomiting. 3.  Diarrhea. 4.  Protein calorie malnutrition with hypoalbuminemia. 5.  Mild colonic thickening.  Highly likely from hypoalbuminemia. 6.  Steatosis liver.  I do not suspect cirrhosis (at least do not suspect portal hypertension).  Steatosis could be from patient's prior alcohol abuse (but no alcohol for the past several months), steatosis has also been described in setting of starvation and profound ketosis. 7.  Overall constellation of symptoms reviewed.  I have discussed with Dr. Therisa Doyne, patient's gastroenterologist.  We both feel patient's symptoms are most likely due to profound depression.  Other much less likely differential considerations would be chronic pancreatitis, gastroparesis, biliary dyskinesia.  Plan:  1.  Trial pancreatic enzymes. 2.  Ondansetron scheduled for nausea. 3.  Would not use metoclopramide given my concern for depression and fear of extrapyramidal side effects. 4.  HIDA scan. 5.  Gastric emptying study is useless and unhelpful at the present time, as profound starvation (regardless of cause) will slow gastric emptying but doesn't mean there is gastroparesis. 6.  Patient could not tolerate colonoscopy prep at this time, and I very much doubt anything is going on in colon, but once nausea/vomiting improves we could consider colonoscopy (most likely as outpatient). 7.  Psychiatry consult. 8.  Case discussed with Dr. Loleta Books. 9.  [facility] will follow.   LOS: 0 days   Hanan Moen M  02/16/2018, 12:45 PM  Cell [phone] If no answer or after 5 PM call [phone]

## 2018-02-16 NOTE — H&P (Signed)
History and Physical  Patient Name: Maria Avila     QJJ:[id number]    DOB: 1950/06/19    DOA: 02/16/2018 PCP: [name], MD  Patient coming from: Home  Chief Complaint: Weightloss, abdominal pain, anorexia, malaise, weakness, vomiting      HPI: Maria Avila is a 67 y.o. female with a past medical history significant for NIDDM, HTN who presents with several months weight loss, malaise and weakness, now with periumbilical pain and vomiting.  Patient symptoms started probably 6 months ago.  At that time she first started to notice weight loss and lack of appetite and malaise and generalized fatigue.  She was seen by her PCP, noted to have neutropenia, referred to hematology where flow cytometry smear and CT abdomen pelvis were unremarkable, and she was diagnosed with benign neutropenia.  Her symptoms persisted, she developed 4-5 loose stools per day most days, vomiting every couple days, persistent weight loss, and worsening fatigue.  She was evaluated by [facility], Dr. Therisa Doyne, and describes EGD and colonoscopy, which may have been normal, she does not know any abnormal results.  Finally, this past two weeks, she has been to the ER 4 times for weakness and been diagnosed with dehydration, given fluids and discharged.  Finally, today, she had some periumbilical pain, more vomiting, and returned to the ER.  ED course: - Afebrile, heart rate 117, respirations 20, blood pressure 103/81 -Na 138, K 3.4, Cr 0.7, WBC 4.3K, Hgb 10.9, MCV upper limit of normal - Lipase normal -AST and ALT unremarkable, total bilirubin 1.3, direct bilirubin slightly elevated -She was given Zosyn times 1 for possible colitis - There was a question of slurred speech by her family, and so CT head was obtained which was unremarkable -She just had a CT angiogram of the abdomen and pelvis and ultrasound of the abdomen 1 week prior, which only showed hepatic steatosis, so these were not repeated - Due to her failure to  thrive, TRH were asked to evaluate for IV fluids, possible colitis    Of note, the patient has not had anything to drink for 4 months, but prior to that she did have alcohol daily, usually 1-2 drinks when she reports.  She has no family history of autoimmune disease.  She is never lived abroad.  She does not use acetaminophen daily.  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    ROS: Review of Systems  Constitutional: Positive for chills, malaise/fatigue and weight loss. Negative for fever.  Respiratory: Negative for cough, sputum production and shortness of breath.   Cardiovascular: Negative for chest pain, orthopnea, claudication and leg swelling.  Gastrointestinal: Positive for abdominal pain, diarrhea, nausea and vomiting. Negative for blood in stool, constipation, heartburn and melena.  Genitourinary: Negative for dysuria, frequency and urgency.  Skin: Negative for rash.  Neurological: Positive for speech change and weakness. Negative for dizziness, tingling, tremors, sensory change, focal weakness, seizures, loss of consciousness and headaches.  Psychiatric/Behavioral: The patient has insomnia.   All other systems reviewed and are negative.         Past Medical History:  Diagnosis Date  . Colitis   . Diabetes mellitus without complication ([place])   . Hypertension     Past Surgical History:  Procedure Laterality Date  . NO PAST SURGERIES     UNCERTAIN OF NAME OF SURGERY    Social History: Patient lives with her husband.  The patient walks unassisted.  Former alcohol use, daily, vague about  quantity.  Former smoker.  Worked for [employer].  Allergies  Allergen Reactions  . Crestor [Rosuvastatin Calcium] Swelling and Other (See Comments)    Swelling of legs and muscle weakness  . Zocor [Simvastatin] Swelling and Other (See Comments)    Swelling of legs and muscle weakness    Family history: family history includes Alzheimer's disease in her mother; Colon cancer in  her father.  No family history of liver disase.  No family histoyr of autoimmune disease.  Prior to Admission medications   Medication Sig Start Date End Date Taking? Authorizing Provider  aspirin EC 81 MG tablet Take 81 mg by mouth daily. 07/21/17   [provider]  Cholecalciferol (VITAMIN D) 2000 units CAPS Take 1 capsule by mouth daily. 07/21/17   [provider]  lisinopril (PRINIVIL,ZESTRIL) 10 MG tablet Take 10 mg by mouth daily. 07/16/17   [provider]  Multiple Vitamin (MULTIVITAMIN) capsule Take 1 capsule by mouth daily. 07/21/17   [provider]  naproxen sodium (ALEVE) 220 MG tablet Take 220 mg by mouth 2 (two) times daily as needed. 07/21/17   [provider]  Omega-3 Fatty Acids (FISH OIL) 1000 MG CAPS Take 1 capsule by mouth daily. 07/21/17   [provider]  ondansetron (ZOFRAN ODT) 4 MG disintegrating tablet Take 1 tablet (4 mg total) by mouth every 6 (six) hours as needed. 02/01/18   Ward, Delice Bison, DO  pantoprazole (PROTONIX) 40 MG tablet Take 40 mg by mouth daily. 07/21/17   [provider]  pravastatin (PRAVACHOL) 40 MG tablet Take 40 mg by mouth every evening. 12/20/17   [provider]       Physical Exam: BP (!) 128/92   Pulse (!) 102   Temp 98.1 F (36.7 C) (Oral)   Resp 18   Ht 5\' 2"  (1.575 m)   Wt 48.7 kg   SpO2 100%   BMI 19.64 kg/m  General appearance: Thin adult female, awake but somewhat somnoletn, tired, no acute distress.   Eyes: Mild icterus, conjunctiva pink, lids and lashes normal. PERRL.   Amblopia on left eye. ENT: No nasal deformity, discharge, epistaxis.  Hearing normal. OP moist without lesions.  Dentnion poor.  Tongue pink, smoother than normal. Neck: No neck masses.  Trachea midline.  No thyromegaly/tenderness. Lymph: No cervical or supraclavicular lymphadenopathy. Skin: Warm and dry.  No jaundice in skin.  No suspicious rashes or lesions. Cardiac: RRR, nl S1-S2, no  murmurs appreciated.  Capillary refill is brisk.  JVP normal.  No LE edema.  Radial pulses 2+ and symmetric. Respiratory: Normal respiratory rate and rhythm.  CTAB without rales or wheezes. Abdomen: Abdomen soft.  Mild nonfocal TTP without guarding.  I do not appreciate the liver edge. No ascites, distension.  No splenomegaly, no mass.   MSK: No deformities or effusions.  No cyanosis or clubbing, diffuse loss of subcutaneous muscle mass and fat.  Neuro: Cranial nerves difficult to test because she is poor at following questions.  Heraing normal, facies symmetric, tongue fasciculates.  Does not follow eye movements past left midline.  Sensation intact to light touch.  Strength weak on right arm, normal on left, legs bilaterally symmetrically weak. Speech is hypophonic.   No asterexis. Psych: Sensorium intact and responding to questions, attention diminished.  Affect blunted.  Judgment and insight appear impaired.     Labs on Admission:  I have personally reviewed following labs and imaging studies: CBC: Recent Labs  Lab 02/13/18 1801 02/16/18 0232  WBC 6.9 4.3  HGB 12.1 10.9*  HCT 34.9* 32.2*  MCV 98.3 98.2  PLT 188 220   Basic Metabolic Panel: Recent Labs  Lab 02/13/18 1801 02/16/18 0232  NA 136 138  K 3.1* 3.4*  CL 98 101  CO2 23 24  GLUCOSE 99 106*  BUN 17 12  CREATININE 0.79 0.71  CALCIUM 9.2 9.0   GFR: Estimated Creatinine Clearance: 51.7 mL/min (by C-G formula based on SCr of 0.71 mg/dL).  Liver Function Tests: Recent Labs  Lab 02/13/18 1801 02/16/18 0459  AST 28 28  ALT 19 19  ALKPHOS 60 56  BILITOT 1.5* 1.3*  PROT 6.6 5.7*  ALBUMIN 2.7* 2.4*   Recent Labs  Lab 02/13/18 1801 02/16/18 0459  LIPASE 40 44    CBG: Recent Labs  Lab 02/16/18 0212  GLUCAP 80        Radiological Exams on Admission: Personally reviewed CT head report: Ct Head Wo Contrast  Result Date: 02/16/2018 CLINICAL DATA:  Generalize weakness and slurred speech for 2 weeks.  EXAM: CT HEAD WITHOUT CONTRAST TECHNIQUE: Contiguous axial images were obtained from the base of the skull through the vertex without intravenous contrast. COMPARISON:  04/01/2005 FINDINGS: Brain: No evidence of acute infarction, hemorrhage, hydrocephalus, extra-axial collection or mass lesion/mass effect. Vascular: Mild intracranial arterial vascular calcifications. Skull: Calvarium appears intact. Sinuses/Orbits: Paranasal sinuses and mastoid air cells are clear. Other: None. IMPRESSION: No acute intracranial abnormality. Electronically Signed   By: Lucienne Capers M.D.   On: 02/16/2018 06:24        Assessment/Plan  Weight loss Weakness Malaise Dehydration  Patient presents with chronic progressive weakness anorexia, now dehydration with tachycardia rate 117.  She has been worked up so far by hematology and gastroenterology with normal peripheral smear, flow cytometry, and CT abdomen and pelvis, as well as EGD and colonoscopy (results unknown).  The differential may include colitis (although I doubt infectious given time course), cirrhosis, chronic gallbladder disease?, gastroparesis, or depression and nutritional deficiency. -Check INR, ammonia -Eval for hep C -Consult GI, appreciate expert guidance -If no organic liver, gallbladder, or gastric emptying disease is suspected or confirmed with testing, will consult Psychiatry prior to discharge    Elevated bilirubin -Trend LFTs -Consult GI to recommend if further work up with HIDA or MRCP is warranted  Slurred speech and right sided weakness Family report the speech has been abnormal for weeks, they had not noticed right sided weakness, nor had patient, although this is initially present on my exam. -MRI brain to rule out infarction  Diarrhea Time course inconsistent with infectious diarrhea.  HIV negative. -Consult GI  Hypokalemia -Supplement K -Check mag  Anemia CV upper limit of normal.  Recently evaluated by hematology,  unclear cause. -Check B12, folate -Check iron stores  Diabetes Is diet-controlled  Hypertension  -Continue home lisinopril, pravastatin  Malnutrition -Consult nutrition  Medications -Continue home PPI       DVT prophylaxis: Lovenox  Code Status: FULL  Family Communication: Son at bedside  Disposition Plan: Anticipate owrk up today.  If testing of blood today is normal, discussed with family that we would likely discharge to home tomorrow with GI follow up.   Consults called: Gastroenterology, Dr. Paulita Fujita Admission status: OBS At the point of initial evaluation, it is my clinical opinion that admission for OBSERVATION is reasonable and necessary because the patient's presenting complaints in the context of their chronic conditions represent sufficient risk of deterioration or significant morbidity to constitute reasonable grounds for  close observation in the hospital setting, but that the patient may be medically stable for discharge from the hospital within 24 to 48 hours.    Medical decision making: Patient seen at 8:14 AM on 02/16/2018.  The patient was discussed with Dr. Paulita Fujita.  What exists of the patient's chart was reviewed in depth and summarized above.  Clinical condition: stable.        Edwin Dada [facility] Pager [phone]

## 2018-02-16 NOTE — ED Notes (Signed)
Attempted to call report

## 2018-02-16 NOTE — Plan of Care (Signed)

## 2018-02-16 NOTE — ED Triage Notes (Signed)
Pt comes in with complaints of generalized weakness, slurred speech for the past two weeks, neuro intact bilaterally. Seen last week and told she was dehydrated. Complaining of epigastric pain

## 2018-02-16 NOTE — ED Provider Notes (Signed)
TIME SEEN: 4:30 AM  CHIEF COMPLAINT: Generalized weakness, nausea, vomiting, diarrhea  HPI: Patient is a 67 year old female with history of hypertension who presents the emergency department with several weeks of nausea, vomiting and diarrhea.  Also complaining of diffuse abdominal pain.  States symptoms have gotten so bad that she cannot walk because she feels so weak and she has no appetite.  Family also noted that she seemed to have some slightly slurred speech today and that she was "slower" to talk than normal.  No numbness, tingling or focal weakness.  No head injury.  No chest pain or shortness of breath.  ROS: See HPI Constitutional: no fever  Eyes: no drainage  ENT: no runny nose   Cardiovascular:  no chest pain  Resp: no SOB  GI: Diarrhea and vomiting GU: no dysuria Integumentary: no rash  Allergy: no hives  Musculoskeletal: no leg swelling  Neurological: no slurred speech ROS otherwise negative  PAST MEDICAL HISTORY/PAST SURGICAL HISTORY:  Past Medical History:  Diagnosis Date  . Hypertension     MEDICATIONS:  Prior to Admission medications   Medication Sig Start Date End Date Taking? Authorizing Provider  aspirin EC 81 MG tablet Take 81 mg by mouth daily. 07/21/17   [provider]  Cholecalciferol (VITAMIN D) 2000 units CAPS Take 1 capsule by mouth daily. 07/21/17   [provider]  lisinopril (PRINIVIL,ZESTRIL) 10 MG tablet Take 10 mg by mouth daily. 07/16/17   [provider]  Multiple Vitamin (MULTIVITAMIN) capsule Take 1 capsule by mouth daily. 07/21/17   [provider]  naproxen sodium (ALEVE) 220 MG tablet Take 220 mg by mouth 2 (two) times daily as needed. 07/21/17   [provider]  Omega-3 Fatty Acids (FISH OIL) 1000 MG CAPS Take 1 capsule by mouth daily. 07/21/17   [provider]  ondansetron (ZOFRAN ODT) 4 MG disintegrating tablet Take 1 tablet (4 mg total) by mouth every 6 (six) hours as needed. 02/01/18    Gerrica Cygan, Delice Bison, DO  pantoprazole (PROTONIX) 40 MG tablet Take 40 mg by mouth daily. 07/21/17   [provider]  pravastatin (PRAVACHOL) 40 MG tablet Take 40 mg by mouth every evening. 12/20/17   [provider]    ALLERGIES:  Allergies  Allergen Reactions  . Crestor [Rosuvastatin Calcium] Swelling and Other (See Comments)    Swelling of legs and muscle weakness  . Zocor [Simvastatin] Swelling and Other (See Comments)    Swelling of legs and muscle weakness    SOCIAL HISTORY:  Social History   Tobacco Use  . Smoking status: Never Smoker  . Smokeless tobacco: Never Used  Substance Use Topics  . Alcohol use: Not Currently    FAMILY HISTORY: No family history on file.  EXAM: BP 103/81   Pulse (!) 117   Temp 98.6 F (37 C) (Oral)   Resp 20   SpO2 100%  CONSTITUTIONAL: Alert and oriented and responds appropriately to questions.  Slow to answer questions.  Afebrile. HEAD: Normocephalic EYES: Conjunctivae clear, pupils appear equal, EOMI ENT: normal nose; moist mucous membranes NECK: Supple, no meningismus, no nuchal rigidity, no LAD  CARD: Regular and tachycardic; S1 and S2 appreciated; no murmurs, no clicks, no rubs, no gallops RESP: Normal chest excursion without splinting or tachypnea; breath sounds clear and equal bilaterally; no wheezes, no rhonchi, no rales, no hypoxia or respiratory distress, speaking full sentences ABD/GI: Normal bowel sounds; non-distended; soft, few sleep tender throughout the abdomen, no rebound, no guarding, no  peritoneal signs, no hepatosplenomegaly BACK:  The back appears normal and is non-tender to palpation, there is no CVA tenderness EXT: Normal ROM in all joints; non-tender to palpation; no edema; normal capillary refill; no cyanosis, no calf tenderness or swelling    SKIN: Normal color for age and race; warm; no rash NEURO: Moves all extremities equally, strength 5-/5 in all 4 extremities, sensation to light touch intact  diffusely, normal speech, cranial nerves II through XII intact PSYCH: The patient's mood and manner are appropriate. Grooming and personal hygiene are appropriate.  MEDICAL DECISION MAKING: Patient here with generalized weakness, nausea, vomiting and diarrhea.  CT scan of her abdomen pelvis done on 02/05/2018 showed colitis.  Patient is not improving with outpatient treatment.  Family also concern for possible stroke given dysarthria but no focal neurologic deficits currently.  Labs unremarkable today.  Will obtain CT of the head, give IV fluids, Zosyn for colitis.  Anticipate admission for colitis failing outpatient management.  ED PROGRESS:    4:54 AM Discussed patient's case with hospitalist, Dr. Blaine Hamper.  I have recommended admission and patient (and family if present) agree with this plan. Admitting physician will place admission orders.   Hospitalist to follow-up on head CT.  I reviewed all nursing notes, vitals, pertinent previous records, EKGs, lab and urine results, imaging (as available).     EKG Interpretation  Date/Time:  Tuesday February 16 2018 04:58:51 EDT Ventricular Rate:  97 PR Interval:  148 QRS Duration: 60 QT Interval:  410 QTC Calculation: 521 R Axis:   21 Text Interpretation:  Sinus rhythm Borderline low voltage, extremity leads Abnormal R-wave progression, early transition Nonspecific T abnormalities, lateral leads Prolonged QT interval No significant change since last tracing Confirmed by Tyreonna Czaplicki, Cyril Mourning [phone]) on 02/16/2018 5:03:58 AM          Saafir Abdullah, Delice Bison, DO 02/16/18 [id number]

## 2018-02-16 NOTE — Progress Notes (Signed)
Patients son states patient was about 300lb just a few months ago. Has not been eating well at all. Patient refusing Ensure D/T diarrhea.

## 2018-02-16 NOTE — Evaluation (Signed)
Physical Therapy Evaluation Patient Details Name: Maria Avila MRN: [id number] DOB: Jun 05, 1950 Today's Date: 02/16/2018   History of Present Illness  Maria Avila is a 67 y.o. female with a past medical history significant for NIDDM, HTN who presents with several months weight loss, malaise and weakness, now with periumbilical pain and vomiting.  Clinical Impression  Pt admitted with above diagnosis. Pt currently with functional limitations due to the deficits listed below (see PT Problem List). On eval pt shows R gaze preference, R lean, and L visual field cut. Husband reports he is not sure whn this started. Mod/ max A for bed mobility and max A to transfer with RW to [facility]. Pt's husband reports that pt has been able to walk very little at home and last time they went home from the ED they had great difficulty getting up the stairs into the home. Recommending post acute rehab before going home.  Pt will benefit from skilled PT to increase their independence and safety with mobility to allow discharge to the venue listed below.       Follow Up Recommendations CIR;Supervision/Assistance - 24 hour    Equipment Recommendations  Rolling walker with 5" wheels;3in1 (PT)    Recommendations for Other Services Speech consult;Rehab consult     Precautions / Restrictions Precautions Precautions: Fall Restrictions Weight Bearing Restrictions: No      Mobility  Bed Mobility Overal bed mobility: Needs Assistance Bed Mobility: Supine to Sit;Sit to Supine     Supine to sit: Mod assist Sit to supine: Max assist   General bed mobility comments: pt had difficulty problem solving to move sheets off feet. Mod A for LE's to EOB, once pt got moving, she was able to elevate trunk into sitting. Had difficulty scooting to EOB. Max A for LE's back into bed and mgmt of trunk down to mattress  Transfers Overall transfer level: Needs assistance Equipment used: Rolling walker (2 wheeled) Transfers: Sit  to/from [facility] Sit to Stand: Max assist;Mod assist Stand pivot transfers: Max assist       General transfer comment: max A to stand to RW and pt with R lean. Heavy R lean with SPT to BSC with RW, max A to maintain upright. With return to bed, RW not used and therapist stood in front of pt. Difficulty with placement of L foot and following directional cues.   Ambulation/Gait             General Gait Details: unable to perform safely without +2 assist  Stairs            Wheelchair Mobility    Modified Rankin (Stroke Patients Only)       Balance Overall balance assessment: Needs assistance Sitting-balance support: Bilateral upper extremity supported;Feet supported Sitting balance-Leahy Scale: Poor Sitting balance - Comments: pt with posterior and R lean in sitting, needed min A to maintain sitting with occasional min-guard Postural control: Posterior lean;Right lateral lean Standing balance support: Bilateral upper extremity supported Standing balance-Leahy Scale: Poor Standing balance comment: R lean                             Pertinent Vitals/Pain Pain Assessment: Faces Faces Pain Scale: Hurts even more Pain Location: abdomen, noted swelling L side Pain Descriptors / Indicators: Aching Pain Intervention(s): Limited activity within patient's tolerance;Monitored during session    Home Living Family/patient expects to be discharged to:: Private residence Living Arrangements: Spouse/significant other  Available Help at Discharge: Family;Available 24 hours/day Type of Home: House Home Access: Stairs to enter   [employer] of Steps: 3 Home Layout: One level Home Equipment: None      Prior Function Level of Independence: Needs assistance   Gait / Transfers Assistance Needed: pt's husband reports that she has not been able to walk much past few weeks and that last time she was sent home from the ER, he had a veru hard  time getting her up the steps  ADL's / Homemaking Assistance Needed: husband has been assisting with bathing and dressing. She has been having frequent diarrhea and has been dependent on him for mgmt of clean up from this as well        Hand Dominance   Dominant Hand: Right    Extremity/Trunk Assessment   Upper Extremity Assessment Upper Extremity Assessment: Defer to OT evaluation    Lower Extremity Assessment Lower Extremity Assessment: Generalized weakness;RLE deficits/detail;LLE deficits/detail RLE Deficits / Details: knee ext 3+/5, knee flex 3/5, hip flex 3/5 RLE Sensation: WNL RLE Coordination: WNL LLE Deficits / Details: hip flex 3-/5, knee ext 2+/5, knee flex 2+/5, uncoordinated mvmt when trying to MMT LLE Sensation: WNL LLE Coordination: decreased fine motor;decreased gross motor    Cervical / Trunk Assessment Cervical / Trunk Assessment: Kyphotic  Communication   Communication: No difficulties  Cognition Arousal/Alertness: Awake/alert Behavior During Therapy: Flat affect Overall Cognitive Status: Impaired/Different from baseline Area of Impairment: Problem solving;Following commands                       Following Commands: Follows one step commands consistently;Follows one step commands with increased time;Follows multi-step commands inconsistently     Problem Solving: Slow processing;Difficulty sequencing;Requires verbal cues;Requires tactile cues General Comments: pt answered simple questions appropriately but gave answers not more than a few words and did not engage in any complex conversation. Husband reports she has been this way for a little while      General Comments General comments (skin integrity, edema, etc.): Pt R gaze preference, able to cross midline when cued. Also with L visual field cut in setting of having visual deficits and not having glasses here.     Exercises     Assessment/Plan    PT Assessment Patient needs continued PT  services  PT Problem List Decreased strength;Decreased activity tolerance;Decreased balance;Decreased mobility;Decreased cognition;Decreased knowledge of use of DME;Decreased knowledge of precautions;Pain;Decreased safety awareness;Decreased coordination       PT Treatment Interventions DME instruction;Gait training;Functional mobility training;Therapeutic activities;Therapeutic exercise;Balance training;Patient/family education;Cognitive remediation;Neuromuscular re-education    PT Goals (Current goals can be found in the Care Plan section)  Acute Rehab PT Goals Patient Stated Goal: return home but agreeable to some form of rehab first PT Goal Formulation: With patient/family Time For Goal Achievement: 03/02/18 Potential to Achieve Goals: Good    Frequency Min 2X/week   Barriers to discharge Inaccessible home environment pt with 3 STE home and had great difficulty getting in recently    Co-evaluation               AM-PAC PT "6 Clicks" Daily Activity  Outcome Measure Difficulty turning over in bed (including adjusting bedclothes, sheets and blankets)?: A Little Difficulty moving from lying on back to sitting on the side of the bed? : Unable Difficulty sitting down on and standing up from a chair with arms (e.g., wheelchair, bedside commode, etc,.)?: Unable Help needed moving to and from a bed to  chair (including a wheelchair)?: A Lot Help needed walking in hospital room?: Total Help needed climbing 3-5 steps with a railing? : Total 6 Click Score: 9    End of Session Equipment Utilized During Treatment: Gait belt Activity Tolerance: Patient tolerated treatment well Patient left: in bed;with call bell/phone within reach;with bed alarm set;with family/visitor present Nurse Communication: Mobility status PT Visit Diagnosis: Unsteadiness on feet (R26.81);Muscle weakness (generalized) (M62.81);Hemiplegia and hemiparesis;Difficulty in walking, not elsewhere classified  (R26.2);Adult, failure to thrive (R62.7);Pain Hemiplegia - Right/Left: Left Hemiplegia - dominant/non-dominant: Non-dominant Hemiplegia - caused by: Unspecified Pain - part of body: (abdomen)    Time: 6546-5035 PT Time Calculation (min) (ACUTE ONLY): 44 min   Charges:   PT Evaluation $PT Eval Moderate Complexity: 1 Mod PT Treatments $Therapeutic Activity: 23-37 mins        Leighton Roach, [place]  Pager [phone] Office [place] 02/16/2018, 4:38 PM

## 2018-02-16 NOTE — Care Management (Signed)
This is a no charge note  Pending admission per Dr. Leonides Schanz.   67 year old lady with a past medical history of hypertension, hyperlipidemia, lymphoma, GERD, diagnosed with colitis by CT scanon 10/4 , presenting with nausea, vomiting, diarrhea and generalized weakness.  Family reports slurred speech, but no focal neurologic findings per ED physician.  Patient was started with Zosyn by ED physician.  CT head was ordered by EDP  Patient is placed on telemetry bed for observation.   Ivor Costa, MD  [facility] Pager [phone]  If 7PM-7AM, please contact night-coverage [email or web address] Password [email or web address] 02/16/2018, 4:50 AM

## 2018-02-16 NOTE — Progress Notes (Signed)
Patient transferred from [place] , alert and awake, weak looking, not in any form of distress, denies pain at this time, report received from [place].

## 2018-02-16 NOTE — ED Notes (Signed)
Patient transported to CT scan . 

## 2018-02-17 DIAGNOSIS — R627 Adult failure to thrive: Secondary | ICD-10-CM

## 2018-02-17 DIAGNOSIS — Z87891 Personal history of nicotine dependence: Secondary | ICD-10-CM

## 2018-02-17 DIAGNOSIS — K529 Noninfective gastroenteritis and colitis, unspecified: Secondary | ICD-10-CM

## 2018-02-17 DIAGNOSIS — E43 Unspecified severe protein-calorie malnutrition: Secondary | ICD-10-CM | POA: Diagnosis present

## 2018-02-17 DIAGNOSIS — E440 Moderate protein-calorie malnutrition: Secondary | ICD-10-CM | POA: Diagnosis not present

## 2018-02-17 LAB — CBC
HEMATOCRIT: 25.2 % — AB (ref 36.0–46.0)
Hemoglobin: 8.4 g/dL — ABNORMAL LOW (ref 12.0–15.0)
MCH: 32.7 pg (ref 26.0–34.0)
MCHC: 33.3 g/dL (ref 30.0–36.0)
MCV: 98.1 fL (ref 80.0–100.0)
PLATELETS: 122 10*3/uL — AB (ref 150–400)
RBC: 2.57 MIL/uL — ABNORMAL LOW (ref 3.87–5.11)
RDW: 12.6 % (ref 11.5–15.5)
WBC: 5.4 10*3/uL (ref 4.0–10.5)
nRBC: 0 % (ref 0.0–0.2)

## 2018-02-17 LAB — HEPATITIS PANEL, ACUTE
HCV Ab: <0.1 s/co ratio (ref 0.0–0.9)
HEP B C IGM: NEGATIVE
Hep A IgM: NEGATIVE
Hepatitis B Surface Ag: NEGATIVE

## 2018-02-17 LAB — COMPREHENSIVE METABOLIC PANEL
ALT: 18 U/L (ref 0–44)
ANION GAP: 7 (ref 5–15)
AST: 24 U/L (ref 15–41)
Albumin: 2.1 g/dL — ABNORMAL LOW (ref 3.5–5.0)
Alkaline Phosphatase: 48 U/L (ref 38–126)
BILIRUBIN TOTAL: 0.9 mg/dL (ref 0.3–1.2)
BUN: 11 mg/dL (ref 8–23)
CO2: 20 mmol/L — ABNORMAL LOW (ref 22–32)
Calcium: 7.7 mg/dL — ABNORMAL LOW (ref 8.9–10.3)
Chloride: 112 mmol/L — ABNORMAL HIGH (ref 98–111)
Creatinine, Ser: 0.59 mg/dL (ref 0.44–1.00)
GFR calc Af Amer: >60 mL/min (ref >60)
Glucose, Bld: 92 mg/dL (ref 70–99)
POTASSIUM: 3.4 mmol/L — AB (ref 3.5–5.1)
Sodium: 139 mmol/L (ref 135–145)
Total Protein: 4.7 g/dL — ABNORMAL LOW (ref 6.5–8.1)

## 2018-02-17 LAB — VITAMIN B12: VITAMIN B 12: 453 pg/mL (ref 180–914)

## 2018-02-17 LAB — HIV ANTIBODY (ROUTINE TESTING W REFLEX): HIV Screen 4th Generation wRfx: NONREACTIVE

## 2018-02-17 LAB — TSH: TSH: 1.52 u[IU]/mL (ref 0.350–4.500)

## 2018-02-17 LAB — FERRITIN: FERRITIN: 1522 ng/mL — AB (ref 11–307)

## 2018-02-17 MED ORDER — ADULT MULTIVITAMIN W/MINERALS CH
1.0000 | ORAL_TABLET | Freq: Every day | ORAL | Status: DC
  Administered 2018-02-17 – 2018-02-27 (×9): 1 via ORAL
  Filled 2018-02-17 (×9): qty 1

## 2018-02-17 MED ORDER — ENSURE ENLIVE PO LIQD
237.0000 mL | Freq: Three times a day (TID) | ORAL | Status: DC
  Administered 2018-02-17 – 2018-02-27 (×16): 237 mL via ORAL

## 2018-02-17 MED ORDER — MIRTAZAPINE 15 MG PO TABS
7.5000 mg | ORAL_TABLET | Freq: Every day | ORAL | Status: DC
  Administered 2018-02-17 – 2018-02-24 (×8): 7.5 mg via ORAL
  Filled 2018-02-17 (×9): qty 1

## 2018-02-17 MED ORDER — MAGNESIUM SULFATE 2 GM/50ML IV SOLN
2.0000 g | Freq: Once | INTRAVENOUS | Status: AC
  Administered 2018-02-17: 2 g via INTRAVENOUS
  Filled 2018-02-17: qty 50

## 2018-02-17 MED ORDER — POTASSIUM CHLORIDE CRYS ER 20 MEQ PO TBCR
40.0000 meq | EXTENDED_RELEASE_TABLET | Freq: Two times a day (BID) | ORAL | Status: AC
  Administered 2018-02-17 (×2): 40 meq via ORAL
  Filled 2018-02-17 (×2): qty 2

## 2018-02-17 MED ORDER — SODIUM CHLORIDE 0.9 % IV BOLUS
500.0000 mL | Freq: Once | INTRAVENOUS | Status: AC
  Administered 2018-02-17: 500 mL via INTRAVENOUS

## 2018-02-17 NOTE — Progress Notes (Signed)
Inpatient Rehabilitation  Per PT request, patient was screened by Gunnar Fusi for appropriateness for an Inpatient Acute Rehab consult.  At this time plan to follow along for a diagnosis appropriate for IP Rehab.  Call if questions.   Carmelia Roller., CCC/SLP Admission Coordinator  [place]  Cell [phone]

## 2018-02-17 NOTE — Progress Notes (Signed)
Initial Nutrition Assessment  DOCUMENTATION CODES:   Non-severe (moderate) malnutrition in context of chronic illness  INTERVENTION:   -Increase Ensure Enlive po to TID, each supplement provides 350 kcal and 20 grams of protein -MVI with minerals daily  NUTRITION DIAGNOSIS:   Moderate Malnutrition related to chronic illness(colitis) as evidenced by energy intake < or equal to 75% for > or equal to 1 month, mild fat depletion, moderate fat depletion, mild muscle depletion, moderate muscle depletion, percent weight loss.  GOAL:   Patient will meet greater than or equal to 90% of their needs  MONITOR:   PO intake, Supplement acceptance, Labs, Weight trends, Skin, I & O's  REASON FOR ASSESSMENT:   Malnutrition Screening Tool, Consult Assessment of nutrition requirement/status  ASSESSMENT:   Maria Avila is a 67 y.o. female with a past medical history significant for NIDDM, HTN who presents with several months weight loss, malaise and weakness, now with periumbilical pain and vomiting.  Pt admitted with weight loss, weakness, malaise, and dehydration.   Per GI notes from 02/16/18, rationale for weight loss is likely "profound depression, but could also be chronic pancreatic pancreatitis, gastroparesis, biliary dyskinesia". Also plan for psych consult.   Case discussed with RN prior to visit, who reports that pt has lost a significant amount of weight. She will be NPO for testing tomorrow (02/18/18). She is consuming her Ensure supplements.   Spoke with pt and husband at bedside. Per pt, UBW is around 170#, which she maintained up until she "got sick"; pt and husband report that pt was recently diagnosed with IBD. Pt husband reports that he started noticing her weight loss approximately 6-9 months ago. He shares that she has a very hearty appetite at baseline- she would typically consume 3 meals per day of very hearty foods such as fried chicken and pizza. Pt husband reports that he  started noticing significantly decreased intake over the past 3 months; pt would still consume 3 meals per day, however started to consume about 50% less than what she usually eats. Over the past 1-2 weeks, intake has significantly decreased to only a few bites and sips at meals. Pt husband also reports that pt would complains about food "coming right back up" and husband modified diet to softer foods such as oatmeal and mashed potatoes. Both confirm that intake has improved since hospitalization. Within the past 24 hours, pt consumed a few bites of chicken and dumplings, 100% of mashed potatoes, and 75% of oatmeal. Pt also consumed about 50% of strawberry Ensure supplement. Pt tolerating supplement well ("it's the reason why I feel so much better here").   Pt estimates she has lost about 30# within the past 3 months. Per wt hx, pt has experienced a 25% wt loss over the past 6 months, which is significant for time frame. Pt has also been progressively weaker; pt husband admits that he has been having to assist her with ADLs.   Discussed with pt and husband importance of good meal and supplement intake to promote healing. Pt is happy to be on a regular diet, so that she has a wider variety of food options available to her. She reports she is lactose-intolerant and is amenable to continue Ensure supplements.   Medications reviewed and include creon.   Labs reviewed: K: 3.4, Mg: 1.3.   NUTRITION - FOCUSED PHYSICAL EXAM:    Most Recent Value  Orbital Region  No depletion  Upper Arm Region  Moderate depletion  Thoracic and Lumbar  Region  No depletion  Buccal Region  Mild depletion  Temple Region  Moderate depletion  Clavicle Bone Region  Mild depletion  Clavicle and Acromion Bone Region  Mild depletion  Scapular Bone Region  Mild depletion  Dorsal Hand  Mild depletion  Patellar Region  Moderate depletion  Anterior Thigh Region  Moderate depletion  Posterior Calf Region  Moderate depletion  Edema  (RD Assessment)  Mild  Hair  Reviewed  Eyes  Reviewed  Mouth  Reviewed  Skin  Reviewed  Nails  Reviewed       Diet Order:   Diet Order            Diet NPO time specified  Diet effective midnight        Diet regular Room service appropriate? Yes; Fluid consistency: Thin  Diet effective now              EDUCATION NEEDS:   Education needs have been addressed  Skin:  Skin Assessment: Reviewed RN Assessment  Last BM:  02/17/18  Height:   Ht Readings from Last 1 Encounters:  02/16/18 5\' 2"  (1.575 m)    Weight:   Wt Readings from Last 1 Encounters:  02/16/18 48.7 kg    Ideal Body Weight:  50 kg  BMI:  Body mass index is 19.64 kg/m.  Estimated Nutritional Needs:   Kcal:  1700-1900  Protein:  95-110 grams  Fluid:  >1.7 L    Vern Prestia A. Jimmye Norman, RD, LDN, CDE Pager: [phone] After hours Pager: [phone]

## 2018-02-17 NOTE — Discharge Summary (Addendum)
Physician Discharge Summary  Maria Avila TFT:[id number] DOB: 14-Dec-1950 DOA: 02/16/2018  PCP: [name], MD  Admit date: 02/16/2018 Discharge date: 02/18/2018  Time spent: 45 minutes  Recommendations for Outpatient Follow-up:  1. Follow up with Dr. [facility] 2-3 weeks for post hospital visit and results Hida scan. Eventually will need colonoscopy 2. Follow up with PCP 1-2 weeks for evaluation of appetite. Recommend cmet evaluate electrolytes and LFT's, cbc trend Hg 3. Home health RN and aid for skilled assessment and observation   Discharge Diagnoses:  Principal Problem:   Malnutrition of moderate degree Active Problems:   Colitis   Weakness   Nausea vomiting and diarrhea   Diabetes ([place])   Essential hypertension   Anemia   Discharge Condition: stable  Diet recommendation: regular  Filed Weights   02/16/18 0648  Weight: 48.7 kg    History of present illness:  Maria Avila is a 67 y.o. female with a past medical history significant for NIDDM, HTN who presented 10/15 to ed with several months weight loss, malaise and weakness, now with periumbilical pain and vomiting. Axsociated symptoms include unintentional weight loss, no appetite and malaise and generalized fatigue.  She was seen by her PCP, noted to have neutropenia, referred to hematology where flow cytometry smear and CT abdomen pelvis were unremarkable, and she was diagnosed with benign neutropenia.  Her symptoms persisted, she developed 4-5 loose stools per day most days, vomiting every couple days, persistent weight loss, and worsening fatigue.  She was evaluated by [facility], Dr. Therisa Doyne, and describes EGD and colonoscopy, which may have been normal, she does not know any abnormal results.  Finally, this past two weeks, she has been to the ER 4 times for weakness and been diagnosed with dehydration, given fluids and discharged.  On day of admission she had some periumbilical pain, more vomiting,  and returned to the ER.  Hospital Course:  Weight loss/Weakness/MalaiseDehydration  Patient presented with chronic progressive weakness anorexia, dehydration with tachycardia rate 117. Improved at discharge but remains weak. She received IV fluids and is taking  po fluids and nourishment.   She has been worked up so far by hematology and gastroenterology with normal peripheral smear, flow cytometry, and CT abdomen and pelvis, as well as EGD and colonoscopy. Evaluated by gi who opine protien calore malnutrion with  Hypalbuminemia, steatosis liver, chronic diarrhea in setting of profound depression. INR 1.25, ammonia 23 HIDA scan with the gallbladder emptied spontaneously before consumption of Ensure.While an ejection fraction cannot be calculated, gallbladder emptying is near complete. Spoke with Dr Therisa Doyne who stated patient needs colonoscopy but must be able to tolerate prep. Can do outpatient. Dont think she would be able to tolerate prep.  -small frequent meal -zofran -remeron -pancrelipase -Follow up with Dr Therisa Doyne 1-2 weeks -Home health RN for skilled assessment -evaluated by psychiatry who recommend remeron  Elevated bilirubin -ammonia 23, bilirubin o.4 -OP follow up  Slurred speech and right sided weakness Family report the speech has been abnormal for weeks. Patient mumbles and needs encouragement to speak up. No slurred speech this am. Neuro exam benign.MRI brain to without acute process.   Diarrhea Chronic.  HIV negative. -appreciate GI  Hypokalemia -repleted -close OP follow up  anemia  Recently evaluated by hematology, unclear cause. fobt + -B12 within limits of normal -monitor -GI follow up as noted above  Diabetes Is diet-controlled  Hypertension  -Continue home lisinopril, pravastatin  Malnutrition -Consult nutrition  Procedures:  HIDA scan  Consultations:  [facility]  psychiatry  Discharge Exam: Vitals:   02/18/18 0408 02/18/18  1321  BP: 119/78 104/85  Pulse: 88 81  Resp: 19 18  Temp: 97.8 F (36.6 C)   SpO2: 100% 100%    General: lying in bed alert denies pain in no acute distress Cardiovascular: rrr no MGR no LE edema Respiratory: normal effort BS clear bilaterally no wheeze Abdomen: soft very sluggish bowel sounds non-distended mild diffuse tenderness to palpation. No guarding or rebounding  Discharge Instructions   Discharge Instructions    Diet - low sodium heart healthy   Complete by:  As directed    Discharge instructions   Complete by:  As directed    Follow up with Dr. [facility] 2-3 weeks   Increase activity slowly   Complete by:  As directed      Allergies as of 02/18/2018      Reactions   Crestor [rosuvastatin Calcium] Swelling, Other (See Comments)   Swelling of legs and muscle weakness   Zocor [simvastatin] Swelling, Other (See Comments)   Swelling of legs and muscle weakness      Medication List    TAKE these medications   aspirin EC 81 MG tablet Take 81 mg by mouth daily.   Fish Oil 1000 MG Caps Take 1 capsule by mouth daily.   lisinopril 10 MG tablet Commonly known as:  PRINIVIL,ZESTRIL Take 10 mg by mouth daily.   mirtazapine 7.5 MG tablet Commonly known as:  REMERON Take 1 tablet (7.5 mg total) by mouth at bedtime.   multivitamin capsule Take 1 capsule by mouth daily.   naproxen sodium 220 MG tablet Commonly known as:  ALEVE Take 220 mg by mouth 2 (two) times daily as needed.   ondansetron 4 MG disintegrating tablet Commonly known as:  ZOFRAN-ODT Take 1 tablet (4 mg total) by mouth every 6 (six) hours as needed.   Pancrelipase (Lip-Prot-Amyl) 24000-76000 units Cpep Take 1 capsule (24,000 Units total) by mouth 3 (three) times daily with meals.   pantoprazole 40 MG tablet Commonly known as:  PROTONIX Take 40 mg by mouth daily.   pravastatin 40 MG tablet Commonly known as:  PRAVACHOL Take 40 mg by mouth every evening.   promethazine  12.5 MG tablet Commonly known as:  PHENERGAN Take 12.5 mg by mouth every 6 (six) hours as needed for nausea.   Vitamin D 2000 units Caps Take 1 capsule by mouth daily.      Allergies  Allergen Reactions  . Crestor [Rosuvastatin Calcium] Swelling and Other (See Comments)    Swelling of legs and muscle weakness  . Zocor [Simvastatin] Swelling and Other (See Comments)    Swelling of legs and muscle weakness      The results of significant diagnostics from this hospitalization (including imaging, microbiology, ancillary and laboratory) are listed below for reference.    Significant Diagnostic Studies: Ct Head Wo Contrast  Result Date: 02/16/2018 CLINICAL DATA:  Generalize weakness and slurred speech for 2 weeks. EXAM: CT HEAD WITHOUT CONTRAST TECHNIQUE: Contiguous axial images were obtained from the base of the skull through the vertex without intravenous contrast. COMPARISON:  04/01/2005 FINDINGS: Brain: No evidence of acute infarction, hemorrhage, hydrocephalus, extra-axial collection or mass lesion/mass effect. Vascular: Mild intracranial arterial vascular calcifications. Skull: Calvarium appears intact. Sinuses/Orbits: Paranasal sinuses and mastoid air cells are clear. Other: None. IMPRESSION: No acute intracranial abnormality. Electronically Signed   By: Lucienne Capers M.D.   On: 02/16/2018 06:24  Mr Brain Wo Contrast  Result Date: 02/16/2018 CLINICAL DATA:  Weight loss and weakness. EXAM: MRI HEAD WITHOUT CONTRAST TECHNIQUE: Multiplanar, multiecho pulse sequences of the brain and surrounding structures were obtained without intravenous contrast. COMPARISON:  CT same day FINDINGS: Brain: Generalized atrophy. Diffusion imaging does not show any acute or subacute infarction. The brainstem and cerebellum are normal. Mild chronic small-vessel ischemic change of the cerebral hemispheric deep white matter. No cortical or large vessel territory infarction. No mass lesion, hemorrhage,  hydrocephalus or extra-axial collection. Vascular: Major vessels at the base of the brain show flow. Skull and upper cervical spine: Negative Sinuses/Orbits: Clear/normal Other: None IMPRESSION: No acute or reversible finding. Generalized atrophy. Mild chronic small-vessel ischemic change of the cerebral hemispheric white matter. Electronically Signed   By: Nelson Chimes M.D.   On: 02/16/2018 21:52   Nm Hepato W/eject Fract  Result Date: 02/18/2018 CLINICAL DATA:  Weight loss. EXAM: NUCLEAR MEDICINE HEPATOBILIARY IMAGING WITH GALLBLADDER EF TECHNIQUE: Sequential images of the abdomen were obtained out to 60 minutes following intravenous administration of radiopharmaceutical. After oral ingestion of Ensure, gallbladder ejection fraction was determined. At 60 min, normal ejection fraction is greater than 33%. RADIOPHARMACEUTICALS:  5.3 mCi Tc-55m  Choletec IV COMPARISON:  None. FINDINGS: Prompt uptake and biliary excretion of activity by the liver is seen. Gallbladder activity is visualized, consistent with patency of cystic duct. Biliary activity passes into small bowel, consistent with patent common bile duct. The gallbladder emptied spontaneously before the Ensure. As a result, an ejection fraction cannot be accurately calculated. That being said, the gallbladder is not visualized after ensure and gallbladder emptying is near-complete. IMPRESSION: The gallbladder emptied spontaneously before consumption of Ensure. While an ejection fraction cannot be calculated, gallbladder emptying is near complete. Electronically Signed   By: Dorise Bullion III M.D   On: 02/18/2018 13:40   Ct Angio Abd/pel W And/or Wo Contrast  Result Date: 02/05/2018 CLINICAL DATA:  Persistent acute abdominal pain, nausea, emesis and diarrhea EXAM: CT ANGIOGRAPHY ABDOMEN AND PELVIS WITH CONTRAST AND WITHOUT CONTRAST TECHNIQUE: Multidetector CT imaging of the abdomen and pelvis was performed using the standard protocol during bolus  administration of intravenous contrast. Multiplanar reconstructed images and MIPs were obtained and reviewed to evaluate the vascular anatomy. CONTRAST:  140mL ISOVUE-370 IOPAMIDOL (ISOVUE-370) INJECTION 76% COMPARISON:  07/29/2017 FINDINGS: VASCULAR Aorta: Aorta is atherosclerotic and mildly ectatic, proximal aortic diameter is 2.8 cm in the suprarenal aorta. Negative for significant aneurysm, dissection, occlusive process, retroperitoneal hemorrhage/hematoma, or rupture. Celiac: Widely patent including its branches SMA: Widely patent including its branches Renals: Widely patent bilaterally.  No accessory renal artery. IMA: Remains patent off the distal aorta including its branches Inflow: Atherosclerotic and tortuous iliac vessels without inflow disease or occlusion. No iliac aneurysm or dissection. The common, internal and external iliac arteries remain patent. Proximal Outflow: The common femoral, proximal profunda femoral, and superficial femoral arteries demonstrated are also widely patent. Veins: No veno-occlusive process. Review of the MIP images confirms the above findings. NON-VASCULAR Lower chest: No acute abnormality. Hepatobiliary: Diffuse hypoattenuation of the liver compatible with hepatic steatosis. No biliary dilatation or focal hepatic abnormality. Gallbladder mildly distended, nonspecific. Common bile duct nondilated. Pancreas: Unremarkable. No pancreatic ductal dilatation or surrounding inflammatory changes. Spleen: Normal in size without focal abnormality. Adrenals/Urinary Tract: Adrenal glands are unremarkable. Kidneys are normal, without renal calculi, focal lesion, or hydronephrosis. Bladder is unremarkable. Stomach/Bowel: Negative for bowel obstruction, significant dilatation, ileus, or free air. No fluid collection, abscess or ascites. Scattered  colonic diverticulosis. Diffuse mild colonic wall thickening/edema suggesting nonspecific colitis. Appendix unremarkable. Lymphatic: No adenopathy.  Reproductive: Uterus and bilateral adnexa are unremarkable. Other: No abdominal wall hernia or abnormality. No abdominopelvic ascites. Musculoskeletal: Degenerative changes of the spine. Multilevel facet arthropathy from L2-S1. Acute osseous finding. IMPRESSION: VASCULAR Aortoiliac atherosclerosis, mild ectasia and tortuosity without acute vascular process. Maximal diameter 2.8 cm as above. Ectatic abdominal aorta at risk for aneurysm development. Recommend followup by ultrasound in 5 years. This recommendation follows ACR consensus guidelines: White Paper of the ACR Incidental Findings Committee II on Vascular Findings. J Am Coll Radiol 2013; 10:789-794. Patent mesenteric and renal vasculature without occlusive disease Patent tortuous iliac vessels without inflow disease. NON-VASCULAR Hepatic steatosis Diffuse colonic wall thickening/edema compatible with nonspecific colitis. Scattered minor diverticulosis as well. No fluid collection, abscess, perforation, obstruction pattern or ascites. Electronically Signed   By: Jerilynn Mages.  Shick M.D.   On: 02/05/2018 19:50   US Abdomen Limited Ruq  Result Date: 02/05/2018 CLINICAL DATA:  Abdominal pain. EXAM: ULTRASOUND ABDOMEN LIMITED RIGHT UPPER QUADRANT COMPARISON:  None. FINDINGS: Gallbladder: No gallstones or wall thickening visualized. No sonographic Murphy sign noted by sonographer. Common bile duct: Diameter: 6.9 mm Liver: Hepatic steatosis. No focal mass. Portal vein is patent on color Doppler imaging with normal direction of blood flow towards the liver. IMPRESSION: 1. The gallbladder is normal in appearance. 2. The common bile duct measures 6.9 mm which is borderline for age. Recommend correlation with labs. If there is concern for biliary obstruction, recommend an MRCP or ERCP. Electronically Signed   By: Dorise Bullion III M.D   On: 02/05/2018 21:23    Microbiology: Recent Results (from the past 240 hour(s))  Culture, blood (Routine X 2) w Reflex to ID Panel      Status: None (Preliminary result)   Collection Time: 02/16/18  5:00 AM  Result Value Ref Range Status   Specimen Description BLOOD LEFT ARM  Final   Special Requests   Final    BOTTLES DRAWN AEROBIC AND ANAEROBIC Blood Culture adequate volume   Culture   Final    NO GROWTH 2 DAYS Performed at [facility], [address], [place], [place] [address]    Report Status PENDING  Incomplete  Culture, blood (Routine X 2) w Reflex to ID Panel     Status: None (Preliminary result)   Collection Time: 02/16/18  5:50 AM  Result Value Ref Range Status   Specimen Description BLOOD RIGHT ARM  Final   Special Requests   Final    BOTTLES DRAWN AEROBIC AND ANAEROBIC Blood Culture adequate volume   Culture   Final    NO GROWTH 2 DAYS Performed at [facility], [place] [address], [place], [place] [address]    Report Status PENDING  Incomplete     Labs: Basic Metabolic Panel: Recent Labs  Lab 02/13/18 1801 02/16/18 0232 02/16/18 1158 02/17/18 0205 02/18/18 0314  NA 136 138  --  139 137  K 3.1* 3.4*  --  3.4* 3.9  CL 98 101  --  112* 114*  CO2 23 24  --  20* 18*  GLUCOSE 99 106*  --  92 119*  BUN 17 12  --  11 6*  CREATININE 0.79 0.71  --  0.59 0.49  CALCIUM 9.2 9.0  --  7.7* 7.3*  MG  --   --  1.3*  --   --    Liver Function Tests: Recent Labs  Lab 02/13/18  1801 02/16/18 0459 02/17/18 0205 02/18/18 0314  AST 28 28 24  32  ALT 19 19 18 23   ALKPHOS 60 56 48 51  BILITOT 1.5* 1.3* 0.9 0.8  PROT 6.6 5.7* 4.7* 4.6*  ALBUMIN 2.7* 2.4* 2.1* 1.9*   Recent Labs  Lab 02/13/18 1801 02/16/18 0459  LIPASE 40 44   Recent Labs  Lab 02/16/18 0951  AMMONIA 23   CBC: Recent Labs  Lab 02/13/18 1801 02/16/18 0232 02/17/18 0205  WBC 6.9 4.3 5.4  HGB 12.1 10.9* 8.4*  HCT 34.9* 32.2* 25.2*  23.7*  MCV 98.3 98.2 98.1  PLT 188 169 122*   Cardiac Enzymes: No results for input(s): CKTOTAL, CKMB, CKMBINDEX, TROPONINI in the last 168 hours. BNP: BNP (last 3 results) No  results for input(s): BNP in the last 8760 hours.  ProBNP (last 3 results) No results for input(s): PROBNP in the last 8760 hours.  CBG: Recent Labs  Lab 02/16/18 0212  GLUCAP 80       Signed:  Radene Gunning MD.  [facility] 02/18/2018, 2:44 PM

## 2018-02-17 NOTE — Consult Note (Addendum)
[facility] Face-to-Face Psychiatry Consult   Reason for Consult:  Suspected depression leading to failure to thrive, malnutrition.  Referring Physician:  Dr. Reesa Chew Patient Identification: Maria Avila MRN:  [id number] Principal Diagnosis: Malnutrition of moderate degree [facility]) Diagnosis:   Patient Active Problem List   Diagnosis Date Noted  . Colitis [K52.9] 02/16/2018  . Weakness [R53.1] 02/16/2018  . Nausea vomiting and diarrhea [R11.2, R19.7] 02/16/2018  . Diabetes ([place]) [E11.9] 02/16/2018  . Essential hypertension [I10] 02/16/2018  . Normocytic anemia [D64.9] 02/16/2018    Total Time spent with patient: 1 hour  Subjective:   Maria Avila is a 67 y.o. female patient admitted with failure to thrive.  HPI:   Per chart review, patient was admitted with periumbilical pain, diarrhea and vomiting. She reports a 6 months history of poor appetite, malaise and a 40 pound weight loss. She was noted to have neutropenia and was referred to hematology but her workup was unremarkable. She was diagnosed with benign neutropenia. She was also referred to GI with an unremarkable workup.   On interview, Maria Avila reports that she does not feel depressed or anxious. She denies a prior history of mental health problems. She admits to having her current symptoms for the past 6 months. She reports that she was informed that she may have had a stroke although imaging is only remarkable for mild, chronic small vessel ischemic change. She reports that it has been difficult for her to ambulate for 3 months. Her husband has been assisting her with her ADLs. She had her lunch tray on her table and it was almost fully eaten. She denies current nausea and reports feeling somewhat better. She denies SI, HI or AVH.   Past Psychiatric History: Denies   Risk to Self:  None. Denies SI.  Risk to Others:  None. Denies HI.  Prior Inpatient Therapy:  Denies  Prior Outpatient Therapy:  Denies   Past Medical History:  Past  Medical History:  Diagnosis Date  . Colitis   . Diabetes mellitus without complication ([place])   . Hypertension     Past Surgical History:  Procedure Laterality Date  . NO PAST SURGERIES     UNCERTAIN OF NAME OF SURGERY   Family History:  Family History  Problem Relation Age of Onset  . Alzheimer's disease Mother   . Colon cancer Father    Family Psychiatric  History: Mother-Alzheimer's disease.  Social History:  Social History   Substance and Sexual Activity  Alcohol Use Not Currently     Social History   Substance and Sexual Activity  Drug Use Not Currently    Social History   Socioeconomic History  . Marital status: Married    Spouse name: Not on file  . Number of children: Not on file  . Years of education: Not on file  . Highest education level: Not on file  Occupational History  . Not on file  Social Needs  . Financial resource strain: Not on file  . Food insecurity:    Worry: Not on file    Inability: Not on file  . Transportation needs:    Medical: Not on file    Non-medical: Not on file  Tobacco Use  . Smoking status: Former Research scientist (life sciences)  . Smokeless tobacco: Never Used  Substance and Sexual Activity  . Alcohol use: Not Currently  . Drug use: Not Currently  . Sexual activity: Not on file  Lifestyle  . Physical activity:    Days per week:  Not on file    Minutes per session: Not on file  . Stress: Not on file  Relationships  . Social connections:    Talks on phone: Not on file    Gets together: Not on file    Attends religious service: Not on file    Active member of club or organization: Not on file    Attends meetings of clubs or organizations: Not on file    Relationship status: Not on file  Other Topics Concern  . Not on file  Social History Narrative  . Not on file   Additional Social History: She lives at home with her husband of 78 years. She has 4 adult children and 14 grandchildren. She denies alcohol or illicit substance use.      Allergies:   Allergies  Allergen Reactions  . Crestor [Rosuvastatin Calcium] Swelling and Other (See Comments)    Swelling of legs and muscle weakness  . Zocor [Simvastatin] Swelling and Other (See Comments)    Swelling of legs and muscle weakness    Labs:  Results for orders placed or performed during the hospital encounter of 02/16/18 (from the past 48 hour(s))  CBG monitoring, ED     Status: None   Collection Time: 02/16/18  2:12 AM  Result Value Ref Range   Glucose-Capillary 80 70 - 99 mg/dL  Basic metabolic panel     Status: Abnormal   Collection Time: 02/16/18  2:32 AM  Result Value Ref Range   Sodium 138 135 - 145 mmol/L   Potassium 3.4 (L) 3.5 - 5.1 mmol/L   Chloride 101 98 - 111 mmol/L   CO2 24 22 - 32 mmol/L   Glucose, Bld 106 (H) 70 - 99 mg/dL   BUN 12 8 - 23 mg/dL   Creatinine, Ser 0.71 0.44 - 1.00 mg/dL   Calcium 9.0 8.9 - 10.3 mg/dL   GFR calc non Af Amer >60 >60 mL/min   GFR calc Af Amer >60 >60 mL/min    Comment: (NOTE) The eGFR has been calculated using the CKD EPI equation. This calculation has not been validated in all clinical situations. eGFR's persistently <60 mL/min signify possible Chronic Kidney Disease.    Anion gap 13 5 - 15    Comment: Performed at [facility] [address] [place], [place] [address]  CBC     Status: Abnormal   Collection Time: 02/16/18  2:32 AM  Result Value Ref Range   WBC 4.3 4.0 - 10.5 K/uL   RBC 3.28 (L) 3.87 - 5.11 MIL/uL   Hemoglobin 10.9 (L) 12.0 - 15.0 g/dL   HCT 32.2 (L) 36.0 - 46.0 %   MCV 98.2 80.0 - 100.0 fL   MCH 33.2 26.0 - 34.0 pg   MCHC 33.9 30.0 - 36.0 g/dL   RDW 12.5 11.5 - 15.5 %   Platelets 169 150 - 400 K/uL   nRBC 0.0 0.0 - 0.2 %    Comment: Performed at [facility], [place] [address], [place], [place] [address]  Hepatic function panel     Status: Abnormal   Collection Time: 02/16/18  4:59 AM  Result Value Ref Range   Total Protein 5.7 (L) 6.5 - 8.1 g/dL   Albumin 2.4 (L) 3.5 -  5.0 g/dL   AST 28 15 - 41 U/L   ALT 19 0 - 44 U/L   Alkaline Phosphatase 56 38 - 126 U/L   Total Bilirubin 1.3 (H) 0.3 - 1.2  mg/dL   Bilirubin, Direct 0.4 (H) 0.0 - 0.2 mg/dL   Indirect Bilirubin 0.9 0.3 - 0.9 mg/dL    Comment: Performed at [facility], [place] [address] [place], [place] [address]  Lipase, blood     Status: None   Collection Time: 02/16/18  4:59 AM  Result Value Ref Range   Lipase 44 11 - 51 U/L    Comment: Performed at [facility] [address] [place], [place] [address]  Culture, blood (Routine X 2) w Reflex to ID Panel     Status: None (Preliminary result)   Collection Time: 02/16/18  5:00 AM  Result Value Ref Range   Specimen Description BLOOD LEFT ARM    Special Requests      BOTTLES DRAWN AEROBIC AND ANAEROBIC Blood Culture adequate volume   Culture      NO GROWTH 1 DAY Performed at [facility], [place] [address] [place], [place] [address]    Report Status PENDING   Culture, blood (Routine X 2) w Reflex to ID Panel     Status: None (Preliminary result)   Collection Time: 02/16/18  5:50 AM  Result Value Ref Range   Specimen Description BLOOD RIGHT ARM    Special Requests      BOTTLES DRAWN AEROBIC AND ANAEROBIC Blood Culture adequate volume   Culture      NO GROWTH 1 DAY Performed at [facility], [place] [address], [place], [place] [address]    Report Status PENDING   Protime-INR     Status: Abnormal   Collection Time: 02/16/18  9:51 AM  Result Value Ref Range   Prothrombin Time 15.6 (H) 11.4 - 15.2 seconds   INR 1.25     Comment: Performed at [facility] [address] [place], [place] [address]  Hepatitis panel, acute     Status: None   Collection Time: 02/16/18  9:51 AM  Result Value Ref Range   Hepatitis B Surface Ag Negative Negative   HCV Ab <0.1 0.0 - 0.9 s/co ratio    Comment: (NOTE)                                  Negative:     < 0.8                             Indeterminate: 0.8 - 0.9                                   Positive:     > 0.9 The CDC recommends that a positive HCV antibody result be followed up with a HCV Nucleic Acid Amplification test (124580). Performed At: [facility] [place], [place] [id number] [name] MD [id number]    Hep A IgM Negative Negative   Hep B C IgM Negative Negative  Ammonia     Status: None   Collection Time: 02/16/18  9:51 AM  Result Value Ref Range   Ammonia 23 9 - 35 umol/L    Comment: Performed at [facility], [place] [address] [place], [place] [address]  Lactic acid, plasma     Status: None   Collection Time: 02/16/18 11:58 AM  Result Value Ref Range   Lactic Acid, Venous  1.7 0.5 - 1.9 mmol/L    Comment: Performed at [facility], [place] [address] [place], [place] [address]  Magnesium     Status: Abnormal   Collection Time: 02/16/18 11:58 AM  Result Value Ref Range   Magnesium 1.3 (L) 1.7 - 2.4 mg/dL    Comment: Performed at [facility] [address] [place], [place] [address]  CBC     Status: Abnormal   Collection Time: 02/17/18  2:05 AM  Result Value Ref Range   WBC 5.4 4.0 - 10.5 K/uL   RBC 2.57 (L) 3.87 - 5.11 MIL/uL   Hemoglobin 8.4 (L) 12.0 - 15.0 g/dL    Comment: REPEATED TO VERIFY   HCT 25.2 (L) 36.0 - 46.0 %   MCV 98.1 80.0 - 100.0 fL   MCH 32.7 26.0 - 34.0 pg   MCHC 33.3 30.0 - 36.0 g/dL   RDW 12.6 11.5 - 15.5 %   Platelets 122 (L) 150 - 400 K/uL    Comment: Immature Platelet Fraction may be clinically indicated, consider ordering this additional test [id number]    nRBC 0.0 0.0 - 0.2 %    Comment: Performed at [facility], [place] [address], [place], [place] [address]  Comprehensive metabolic panel     Status: Abnormal   Collection Time: 02/17/18  2:05 AM  Result Value Ref Range   Sodium 139 135 - 145 mmol/L   Potassium 3.4 (L) 3.5 - 5.1 mmol/L   Chloride 112 (H) 98 - 111 mmol/L   CO2 20 (L) 22 - 32 mmol/L   Glucose, Bld 92 70 - 99 mg/dL   BUN 11 8 - 23 mg/dL    Creatinine, Ser 0.59 0.44 - 1.00 mg/dL   Calcium 7.7 (L) 8.9 - 10.3 mg/dL   Total Protein 4.7 (L) 6.5 - 8.1 g/dL   Albumin 2.1 (L) 3.5 - 5.0 g/dL   AST 24 15 - 41 U/L   ALT 18 0 - 44 U/L   Alkaline Phosphatase 48 38 - 126 U/L   Total Bilirubin 0.9 0.3 - 1.2 mg/dL   GFR calc non Af Amer >60 >60 mL/min   GFR calc Af Amer >60 >60 mL/min    Comment: (NOTE) The eGFR has been calculated using the CKD EPI equation. This calculation has not been validated in all clinical situations. eGFR's persistently <60 mL/min signify possible Chronic Kidney Disease.    Anion gap 7 5 - 15    Comment: Performed at [facility] [address] [place], [place] [address]  Vitamin B12     Status: None   Collection Time: 02/17/18  2:05 AM  Result Value Ref Range   Vitamin B-12 453 180 - 914 pg/mL    Comment: (NOTE) This assay is not validated for testing neonatal or myeloproliferative syndrome specimens for Vitamin B12 levels. Performed at [facility], [place] [address], [place], [place] [address]   TSH     Status: None   Collection Time: 02/17/18  2:05 AM  Result Value Ref Range   TSH 1.520 0.350 - 4.500 uIU/mL    Comment: Performed by a 3rd Generation assay with a functional sensitivity of <=0.01 uIU/mL. Performed at [facility], [place] [address] [place], [place] [address]   Ferritin     Status: Abnormal   Collection Time: 02/17/18  2:05 AM  Result Value Ref Range   Ferritin 1,522 (H) 11 - 307 ng/mL  Comment: Performed at [facility], [place] [address], [place], [place] [address]    Current Facility-Administered Medications  Medication Dose Route Frequency Provider Last Rate Last Dose  . 0.9 %  sodium chloride infusion   Intravenous Continuous Edwin Dada, MD 100 mL/hr at 02/16/18 1735    . aspirin EC tablet 81 mg  81 mg Oral Daily Edwin Dada, MD   81 mg at 02/17/18 1108  . enoxaparin (LOVENOX) injection 30 mg  30 mg Subcutaneous Q24H Danford,  Suann Larry, MD   30 mg at 02/17/18 1109  . feeding supplement (ENSURE ENLIVE) (ENSURE ENLIVE) liquid 237 mL  237 mL Oral BID BM Danford, Suann Larry, MD   237 mL at 02/17/18 1109  . Influenza vac split quadrivalent PF (FLUZONE HIGH-DOSE) injection 0.5 mL  0.5 mL Intramuscular Tomorrow-1000 Danford, Suann Larry, MD      . lipase/protease/amylase (CREON) capsule 24,000 Units  24,000 Units Oral TID WC Arta Silence, MD   24,000 Units at 02/17/18 1108  . lisinopril (PRINIVIL,ZESTRIL) tablet 10 mg  10 mg Oral Daily Danford, Suann Larry, MD   10 mg at 02/17/18 1108  . magnesium sulfate IVPB 2 g 50 mL  2 g Intravenous Once Black, Karen M, NP      . MEDLINE mouth rinse  15 mL Mouth Rinse BID Edwin Dada, MD   15 mL at 02/16/18 2206  . ondansetron (ZOFRAN) tablet 4 mg  4 mg Oral Q6H PRN Danford, Suann Larry, MD       Or  . ondansetron (ZOFRAN) injection 4 mg  4 mg Intravenous Q6H PRN Danford, Suann Larry, MD      . pantoprazole (PROTONIX) EC tablet 40 mg  40 mg Oral Daily Danford, Suann Larry, MD   40 mg at 02/17/18 1108  . potassium chloride SA (K-DUR,KLOR-CON) CR tablet 40 mEq  40 mEq Oral BID Black, Karen M, NP      . pravastatin (PRAVACHOL) tablet 40 mg  40 mg Oral QPM Edwin Dada, MD   40 mg at 02/16/18 1814    Musculoskeletal: Strength & Muscle Tone: Generalized weakness. Gait & Station: unable to stand Patient leans: N/A  Psychiatric Specialty Exam: Physical Exam  Nursing note and vitals reviewed. Constitutional: She is oriented to person, place, and time. She appears well-developed and well-nourished.  HENT:  Head: Normocephalic and atraumatic.  Neck: Normal range of motion.  Respiratory: Effort normal.  Musculoskeletal: Normal range of motion.  Neurological: She is alert and oriented to person, place, and time.  Psychiatric: She has a normal mood and affect. Her speech is normal and behavior is normal. Judgment and thought content normal. Cognition  and memory are normal.    Review of Systems  Constitutional: Negative for chills and fever.  Cardiovascular: Positive for chest pain.  Gastrointestinal: Positive for diarrhea. Negative for abdominal pain, constipation, nausea and vomiting.  Psychiatric/Behavioral: Negative for depression, hallucinations, substance abuse and suicidal ideas. The patient is not nervous/anxious and does not have insomnia.   All other systems reviewed and are negative.   Blood pressure (!) 140/97, pulse 87, temperature (!) 97.3 F (36.3 C), temperature source Oral, resp. rate 16, height 5' 2"  (1.575 m), weight 48.7 kg, SpO2 100 %.Body mass index is 19.64 kg/m.  General Appearance: Fairly Groomed, elderly, African American female, wearing a hospital gown with short, curly hair who is lying in bed. NAD.   Eye Contact:  Good  Speech:  Clear and Coherent and  Normal Rate  Volume:  Normal  Mood:  "Okay"  Affect:  Constricted  Thought Process:  Goal Directed, Linear and Descriptions of Associations: Intact  Orientation:  Full (Time, Place, and Person)  Thought Content:  Logical  Suicidal Thoughts:  No  Homicidal Thoughts:  No  Memory:  Immediate;   Good Recent;   Good Remote;   Good  Judgement:  Good  Insight:  Good  Psychomotor Activity:  Decreased  Concentration:  Concentration: Good and Attention Span: Good  Recall:  Good  Fund of Knowledge:  Good  Language:  Good  Akathisia:  No  Handed:  Right  AIMS (if indicated):   N/A  Assets:  Communication Skills Desire for Improvement Housing Intimacy Social Support  ADL's:  Impaired  Cognition:  WNL  Sleep:   N/A   Assessment:  Maria Avila is a 67 y.o. female who was admitted with failure to thrive. She reports generalized weakness, malaise, poor appetite and weight loss for the past 6 months. She denies mood symptoms. She does appear lethargic. She denies SI, HI or AVH. Can consider low dose Remeron for appetite stimulation and nausea. Patient does  not warrant inpatient psychiatric hospitalization.   Treatment Plan Summary: -Can consider low Remeron 7.5-15 mg qhs for nausea and appetite stimulation. May also help with mood if patient is in fact depressed.  -EKG reviewed and QTc 521 on 10/15. Please closely monitor when starting or increasing QTc prolonging agents.  -TSH is WNL.  -Please have SW provide patient with outpatient mental health resources if needed in the future although she denies current mood symptoms. -Psychiatry will sign off on patient at this time. Please consult psychiatry again as needed.     Disposition: No evidence of imminent risk to self or others at present.   Patient does not meet criteria for psychiatric inpatient admission.  Faythe Dingwall, DO 02/17/2018 12:57 PM

## 2018-02-17 NOTE — Evaluation (Signed)
Occupational Therapy Evaluation Patient Details Name: Maria Avila MRN: [id number] DOB: May 12, 1950 Today's Date: 02/17/2018    History of Present Illness Maria Avila is a 67 y.o. female with a past medical history significant for NIDDM, HTN who presents with several months weight loss, malaise and weakness, now with periumbilical pain and vomiting.   Clinical Impression   PT admitted with weight loss and weakness. Pt currently with functional limitiations due to the deficits listed below (see OT problem list). Pt noted to have uncontrollable diarrhea this session with transfer. Pt with cognition deficits noted but does make personal request appropriately. Pt is soft spoke and must repeat information on x2 occassions this session. Pt pleasant and eager to participate.  Pt will benefit from skilled OT to increase their independence and safety with adls and balance to allow discharge CIR.     Follow Up Recommendations  CIR    Equipment Recommendations  Other (comment)(TBA)    Recommendations for Other Services Rehab consult     Precautions / Restrictions Precautions Precautions: Fall Restrictions Weight Bearing Restrictions: No      Mobility Bed Mobility Overal bed mobility: Needs Assistance Bed Mobility: Supine to Sit     Supine to sit: Max assist(mod from bed )     General bed mobility comments: Pt requires (A) for upright posture due to R lean  Transfers Overall transfer level: Needs assistance Equipment used: Rolling walker (2 wheeled) Transfers: Sit to/from Stand Sit to Stand: Max assist         General transfer comment: pt requires mod (A) from bed surface but max (A) from chair surface. Pt initiates but unable to sustain static standing posture. Pt LOB posterior during session as well.     Balance Overall balance assessment: Needs assistance Sitting-balance support: Bilateral upper extremity supported;Feet supported Sitting balance-Leahy Scale: Poor    Postural control: Posterior lean;Right lateral lean Standing balance support: Bilateral upper extremity supported Standing balance-Leahy Scale: Poor Standing balance comment: R lean                           ADL either performed or assessed with clinical judgement   ADL Overall ADL's : Needs assistance/impaired Eating/Feeding: Set up;Sitting Eating/Feeding Details (indicate cue type and reason): pt able to answer yes to setup for breakfast appropraitely pt asking for the hand wipes to clean hands before meal appropriately Grooming: Wash/dry hands;Set up;Sitting   Upper Body Bathing: Moderate assistance   Lower Body Bathing: Maximal assistance   Upper Body Dressing : Moderate assistance   Lower Body Dressing: Maximal assistance   Toilet Transfer: Maximal assistance;RW Toilet Transfer Details (indicate cue type and reason): R lateral lean and LOB x1 posterior Toileting- Clothing Manipulation and Hygiene: Total assistance Toileting - Clothing Manipulation Details (indicate cue type and reason): incontinence of bowel- unable to control voids (diarhea)     Functional mobility during ADLs: Moderate assistance;Rolling walker General ADL Comments: Pt initiated OOB on request and needed (A)  for balance at EOB. pt initiated sit <>Stand but progressively needed more (A). pt lacks awareness to body in place. pt also noted to have motor planning changes. Pt maintained BIL UE on RW.      Vision Baseline Vision/History: Wears glasses Wears Glasses: Reading only       Perception     Praxis      Pertinent Vitals/Pain Pain Assessment: No/denies pain     Hand Dominance Right   Extremity/Trunk Assessment  Upper Extremity Assessment Upper Extremity Assessment: Generalized weakness   Lower Extremity Assessment Lower Extremity Assessment: Generalized weakness   Cervical / Trunk Assessment Cervical / Trunk Assessment: Kyphotic   Communication Communication Communication:  No difficulties   Cognition Arousal/Alertness: Awake/alert Behavior During Therapy: Flat affect Overall Cognitive Status: Impaired/Different from baseline Area of Impairment: Problem solving;Following commands                       Following Commands: Follows one step commands consistently;Follows one step commands with increased time;Follows multi-step commands inconsistently     Problem Solving: Slow processing;Difficulty sequencing;Requires verbal cues;Requires tactile cues General Comments: pt states "did it get the floor" pt attempting to express to OT that she was voiding bowel ( diarrhea). Pt asking "is it natural" when asked if she like to sit in the chair or in the bed after our session. Pt is not reliable for all questioning so previous PT chart review added from Spouse interview   General Comments  pt could benefit from a brief during transfer to help with incontinence. pt will need two skillled hands for ambulation further than around the bed due to chair follow. Pt progressively becoming weaker and higher fall risk    Exercises     Shoulder Instructions      Home Living Family/patient expects to be discharged to:: Private residence Living Arrangements: Spouse/significant other Available Help at Discharge: Family;Available 24 hours/day Type of Home: House Home Access: Stairs to enter [employer] of Steps: 3   Home Layout: One level     Bathroom Shower/Tub: Occupational psychologist: Standard     Home Equipment: None   Additional Comments: obtained from chairt      Prior Functioning/Environment Level of Independence: Needs assistance  Gait / Transfers Assistance Needed: pt's husband reports that she has not been able to walk much past few weeks and that last time she was sent home from the ER, he had a veru hard time getting her up the steps ADL's / Homemaking Assistance Needed: husband has been assisting with bathing and dressing.  She has been having frequent diarrhea and has been dependent on him for mgmt of clean up from this as well Communication / Swallowing Assistance Needed: chart reports that family has noticed some recent slurring. Pt did not verbalize much during eval but was intelligible when she did          OT Problem List: Decreased strength;Decreased activity tolerance;Impaired balance (sitting and/or standing);Decreased safety awareness;Decreased knowledge of use of DME or AE;Decreased knowledge of precautions;Decreased cognition;Decreased coordination      OT Treatment/Interventions: Self-care/ADL training;Therapeutic exercise;Neuromuscular education;Energy conservation;DME and/or AE instruction;Manual therapy;Modalities;Therapeutic activities;Cognitive remediation/compensation;Patient/family education;Balance training    OT Goals(Current goals can be found in the care plan section) Acute Rehab OT Goals Patient Stated Goal: none stated at this time OT Goal Formulation: Patient unable to participate in goal setting Time For Goal Achievement: 03/03/18 Potential to Achieve Goals: Good  OT Frequency: Min 3X/week   Barriers to D/C:            Co-evaluation              AM-PAC PT "6 Clicks" Daily Activity     Outcome Measure Help from another person eating meals?: A Little Help from another person taking care of personal grooming?: A Lot Help from another person toileting, which includes using toliet, bedpan, or urinal?: A Lot Help from another person bathing (including washing, rinsing,  drying)?: A Lot Help from another person to put on and taking off regular upper body clothing?: A Lot Help from another person to put on and taking off regular lower body clothing?: Total 6 Click Score: 12   End of Session Equipment Utilized During Treatment: Gait belt;Rolling walker Nurse Communication: Mobility status;Precautions  Activity Tolerance: Patient tolerated treatment well Patient left: in  chair;with chair alarm set;with call bell/phone within reach  OT Visit Diagnosis: Unsteadiness on feet (R26.81);Muscle weakness (generalized) (M62.81)                Time: 7543-6067 OT Time Calculation (min): 23 min Charges:  OT General Charges $OT Visit: 1 Visit OT Evaluation $OT Eval Moderate Complexity: 1 Mod   Jeri Modena, OTR/L  Acute Rehabilitation Services Pager: [phone] Office: [phone] .   Parke Poisson B 02/17/2018, 9:33 AM

## 2018-02-17 NOTE — Progress Notes (Addendum)
[facility] PROGRESS NOTE  Maria Avila AYT:[id number] DOB: 05/16/1950 DOA: 02/16/2018 PCP: [name], MD  Assessment/Plan:  Weight loss/Weakness/MalaiseDehydration Patient presented with chronic progressive weakness anorexia, dehydration with tachycardia rate 117. Improved this am. She received IV fluids and is taking some po fluids and nourishment this am.  She has been worked up so far by hematology and gastroenterology with normal peripheral smear, flow cytometry, and CT abdomen and pelvis, as well as EGD and colonoscopy. Evaluated by gi who opine protien calore malnutrion with  Hypalbuminemia, steatosis liver, chronic diarrhea in setting of profound depression. Recommended evaluation for Hep C, Hida scan, avoid reglan, scheduled zofran and trial pancreatic enzymes -INR 1.25, ammonia 23 -follow hida scan to be done tomorrow -npo past midnight -psychiatry consult requested  Elevated bilirubin -Trend LFTs -see above  Slurred speech and right sided weakness Family report the speech has been abnormal for weeks. Patient mumbles and needs encouragement to speak up. No slurred speech this am. Neuro exam benign.MRI brain to without acute process.   Diarrhea Chronic. No episodes since yesterday . HIV negative. -appreciate GI  Hypokalemia -improved but still low -magnesium low end of normal -replete mag and potassium -recheck  anemia Recently evaluated by hematology, unclear cause. -B12 within limits of normal - folate pending -GI on board -monitor -will likely need OP follow up  Diabetes Is diet-controlled  Hypertension -fair control -Continue home lisinopril, pravastatin  Malnutrition -Consult nutrition  Code Status: full Family Communication: significant other at bedside Disposition Plan: CIR recommended   Consultants:  [place] gastro[facility]  Normal psychiatry  Procedures:  hida scan scheduled for  10/16  Antibiotics:  none  HPI/Subjective: Maria Avila a 67 y.o.femalewith a past medical history significant for NIDDM, HTNwho presented 10/15 to ed with several monthsweight loss, malaise and weakness, now with periumbilical pain and vomiting. Axsociated symptoms include unintentional weight loss, no appetite and malaise and generalized fatigue. She was seen by her PCP, noted to have neutropenia, referred to hematology where flow cytometry smear and CT abdomen pelvis were unremarkable, and she was diagnosed with benign neutropenia.  Her symptoms persisted, she developed 4-5 loose stools per day most days, vomiting every couple days, persistent weight loss, and worsening fatigue. She was evaluated by [facility], Dr. Therisa Doyne, and describes EGD and colonoscopy, which may have been normal, she does not know any abnormal results. Finally, this past two weeks, she has been to the ER 4 times for weakness and been diagnosed with dehydration, given fluids and discharged. On day of admission she had some periumbilical pain, more vomiting, and returned to the ER.   Objective: Vitals:   02/17/18 0433 02/17/18 1322  BP: (!) 140/97 93/74  Pulse: 87 88  Resp: 16 17  Temp: (!) 97.3 F (36.3 C)   SpO2: 100% 100%    Intake/Output Summary (Last 24 hours) at 02/17/2018 1329 Last data filed at 02/17/2018 1200 Gross per 24 hour  Intake 2249.64 ml  Output -  Net 2249.64 ml   Filed Weights   02/16/18 0648  Weight: 48.7 kg    Exam:   General:  Sitting in chair watching TV quite unengaged with interview and exam  Cardiovascular: rrr no mgr no LE edema  Respiratory: normal effort BS clear bilaterally no wheeze  Abdomen: non-distended soft very sluggish BS mild diffuse tenderness to palpation no guarding or rebounding  Musculoskeletal: joints without swelling/erythema   Data Reviewed: Basic Metabolic Panel: Recent Labs  Lab 02/13/18 1801 02/16/18 0232 02/16/18 1158  02/17/18 0205  NA 136 138  --  139  K 3.1* 3.4*  --  3.4*  CL 98 101  --  112*  CO2 23 24  --  20*  GLUCOSE 99 106*  --  92  BUN 17 12  --  11  CREATININE 0.79 0.71  --  0.59  CALCIUM 9.2 9.0  --  7.7*  MG  --   --  1.3*  --    Liver Function Tests: Recent Labs  Lab 02/13/18 1801 02/16/18 0459 02/17/18 0205  AST 28 28 24   ALT 19 19 18   ALKPHOS 60 56 48  BILITOT 1.5* 1.3* 0.9  PROT 6.6 5.7* 4.7*  ALBUMIN 2.7* 2.4* 2.1*   Recent Labs  Lab 02/13/18 1801 02/16/18 0459  LIPASE 40 44   Recent Labs  Lab 02/16/18 0951  AMMONIA 23   CBC: Recent Labs  Lab 02/13/18 1801 02/16/18 0232 02/17/18 0205  WBC 6.9 4.3 5.4  HGB 12.1 10.9* 8.4*  HCT 34.9* 32.2* 25.2*  MCV 98.3 98.2 98.1  PLT 188 169 122*   Cardiac Enzymes: No results for input(s): CKTOTAL, CKMB, CKMBINDEX, TROPONINI in the last 168 hours. BNP (last 3 results) No results for input(s): BNP in the last 8760 hours.  ProBNP (last 3 results) No results for input(s): PROBNP in the last 8760 hours.  CBG: Recent Labs  Lab 02/16/18 0212  GLUCAP 80    Recent Results (from the past 240 hour(s))  Culture, blood (Routine X 2) w Reflex to ID Panel     Status: None (Preliminary result)   Collection Time: 02/16/18  5:00 AM  Result Value Ref Range Status   Specimen Description BLOOD LEFT ARM  Final   Special Requests   Final    BOTTLES DRAWN AEROBIC AND ANAEROBIC Blood Culture adequate volume   Culture   Final    NO GROWTH 1 DAY Performed at [facility], [place] [address] [place], [place] [address]    Report Status PENDING  Incomplete  Culture, blood (Routine X 2) w Reflex to ID Panel     Status: None (Preliminary result)   Collection Time: 02/16/18  5:50 AM  Result Value Ref Range Status   Specimen Description BLOOD RIGHT ARM  Final   Special Requests   Final    BOTTLES DRAWN AEROBIC AND ANAEROBIC Blood Culture adequate volume   Culture   Final    NO GROWTH 1 DAY Performed at [facility],  [place] [address], [place], [place] [address]    Report Status PENDING  Incomplete     Studies: Ct Head Wo Contrast  Result Date: 02/16/2018 CLINICAL DATA:  Generalize weakness and slurred speech for 2 weeks. EXAM: CT HEAD WITHOUT CONTRAST TECHNIQUE: Contiguous axial images were obtained from the base of the skull through the vertex without intravenous contrast. COMPARISON:  04/01/2005 FINDINGS: Brain: No evidence of acute infarction, hemorrhage, hydrocephalus, extra-axial collection or mass lesion/mass effect. Vascular: Mild intracranial arterial vascular calcifications. Skull: Calvarium appears intact. Sinuses/Orbits: Paranasal sinuses and mastoid air cells are clear. Other: None. IMPRESSION: No acute intracranial abnormality. Electronically Signed   By: Lucienne Capers M.D.   On: 02/16/2018 06:24   Mr Brain Wo Contrast  Result Date: 02/16/2018 CLINICAL DATA:  Weight loss and weakness. EXAM: MRI HEAD WITHOUT CONTRAST TECHNIQUE: Multiplanar, multiecho pulse sequences of the brain and surrounding structures were obtained without intravenous contrast. COMPARISON:  CT same day FINDINGS: Brain: Generalized atrophy. Diffusion imaging does not  show any acute or subacute infarction. The brainstem and cerebellum are normal. Mild chronic small-vessel ischemic change of the cerebral hemispheric deep white matter. No cortical or large vessel territory infarction. No mass lesion, hemorrhage, hydrocephalus or extra-axial collection. Vascular: Major vessels at the base of the brain show flow. Skull and upper cervical spine: Negative Sinuses/Orbits: Clear/normal Other: None IMPRESSION: No acute or reversible finding. Generalized atrophy. Mild chronic small-vessel ischemic change of the cerebral hemispheric white matter. Electronically Signed   By: Nelson Chimes M.D.   On: 02/16/2018 21:52    Scheduled Meds: . aspirin EC  81 mg Oral Daily  . enoxaparin (LOVENOX) injection  30 mg Subcutaneous Q24H  . feeding supplement  (ENSURE ENLIVE)  237 mL Oral BID BM  . Influenza vac split quadrivalent PF  0.5 mL Intramuscular Tomorrow-1000  . lipase/protease/amylase  24,000 Units Oral TID WC  . lisinopril  10 mg Oral Daily  . mouth rinse  15 mL Mouth Rinse BID  . pantoprazole  40 mg Oral Daily  . potassium chloride  40 mEq Oral BID  . pravastatin  40 mg Oral QPM   Continuous Infusions: . sodium chloride 100 mL/hr at 02/16/18 1735  . magnesium sulfate 1 - 4 g bolus IVPB      Principal Problem:   Colitis Active Problems:   Weakness   Nausea vomiting and diarrhea   Diabetes (HCC)   Essential hypertension   Anemia    Time spent: 45 minutes    [place] [facility]  If 7PM-7AM, please contact night-coverage at [email or web address], password [facility] 02/17/2018, 1:29 PM  LOS: 0 days   ATTENDING NOTE:  Patient seen and examined independently by me. I agree with the chief complaint, history, ROS, physical exam, radiology findings, assessment and plan. Anything additional is noted in my addendum.   67 year old with history of diabetes mellitus type 2, essential hypertension came to the hospital with complaints of generalized weakness, malaise and periumbilical pain.  No other complaints.  Previously patient has undergone extensive work-up including endoscopy, colonoscopy CT abdomen and pelvis.  He was recommended by GI that she would benefit from HIDA scan.  Patient reports she still has intermittent periumbilical abdominal pain and due to this she has had poor oral intake.  Vitals:   02/17/18 0433 02/17/18 1322  BP: (!) 140/97 93/74  Pulse: 87 88  Resp: 16 17  Temp: (!) 97.3 F (36.3 C)   SpO2: 100% 100%   Patient is sitting up in the chair appears quite weak.  Overall just slow to respond.  Abdomen is nondistended BUT SLIGHTLY TENDER TO TOUCH diffusely.   Assessment and plan:  Unintentional weight loss and generalized weakness Moderate to severe dehydration Slurred speech, resolved? Anemia  of chronic disease Moderate to severe protein calorie malnutrition Essential hypertension  - Patient is undergone extensive GI work-up as outpatient.  Gastroenterology is following.  Currently awaiting HIDA scan which is scheduled for tomorrow.  In the meantime continue to provide supportive care.  N.p.o. past midnight.  IV fluids.  Vitamin B12 and TSH within normal limits.  Nutrition consult appreciated  Please call with further questions as necessary  Gerlean Ren MD [facility]

## 2018-02-18 ENCOUNTER — Observation Stay (HOSPITAL_COMMUNITY): Payer: Medicare Other

## 2018-02-18 ENCOUNTER — Encounter (HOSPITAL_COMMUNITY): Payer: Self-pay | Admitting: Internal Medicine

## 2018-02-18 LAB — FOLATE RBC
Folate, Hemolysate: 208.1 ng/mL
Folate, RBC: 878 ng/mL (ref >498)
Hematocrit: 23.7 % — ABNORMAL LOW (ref 34.0–46.6)

## 2018-02-18 LAB — COMPREHENSIVE METABOLIC PANEL
ALK PHOS: 51 U/L (ref 38–126)
ALT: 23 U/L (ref 0–44)
ANION GAP: 5 (ref 5–15)
AST: 32 U/L (ref 15–41)
Albumin: 1.9 g/dL — ABNORMAL LOW (ref 3.5–5.0)
BILIRUBIN TOTAL: 0.8 mg/dL (ref 0.3–1.2)
BUN: 6 mg/dL — AB (ref 8–23)
CO2: 18 mmol/L — AB (ref 22–32)
CREATININE: 0.49 mg/dL (ref 0.44–1.00)
Calcium: 7.3 mg/dL — ABNORMAL LOW (ref 8.9–10.3)
Chloride: 114 mmol/L — ABNORMAL HIGH (ref 98–111)
GLUCOSE: 119 mg/dL — AB (ref 70–99)
Potassium: 3.9 mmol/L (ref 3.5–5.1)
SODIUM: 137 mmol/L (ref 135–145)
TOTAL PROTEIN: 4.6 g/dL — AB (ref 6.5–8.1)

## 2018-02-18 LAB — HEMATOLOGY COMMENTS:

## 2018-02-18 LAB — OCCULT BLOOD X 1 CARD TO LAB, STOOL: Fecal Occult Bld: POSITIVE — AB

## 2018-02-18 MED ORDER — MIRTAZAPINE 7.5 MG PO TABS: 7.5000 mg | ORAL_TABLET | Freq: Every day | ORAL | 1 refills | Status: DC

## 2018-02-18 MED ORDER — MORPHINE SULFATE (PF) 2 MG/ML IV SOLN
1.0000 mg | Freq: Once | INTRAVENOUS | Status: AC
  Administered 2018-02-18: 1 mg via INTRAVENOUS
  Filled 2018-02-18: qty 1

## 2018-02-18 MED ORDER — PANCRELIPASE (LIP-PROT-AMYL) 24000-76000 UNITS PO CPEP: 24000.0000 [IU] | ORAL_CAPSULE | Freq: Three times a day (TID) | ORAL | 1 refills | Status: DC

## 2018-02-18 MED ORDER — TECHNETIUM TC 99M MEBROFENIN IV KIT
5.3000 | PACK | Freq: Once | INTRAVENOUS | Status: AC | PRN
  Administered 2018-02-18: 5.3 via INTRAVENOUS

## 2018-02-18 MED ORDER — TRAMADOL HCL 50 MG PO TABS
50.0000 mg | ORAL_TABLET | Freq: Four times a day (QID) | ORAL | Status: DC | PRN
  Administered 2018-02-18 – 2018-02-23 (×7): 50 mg via ORAL
  Filled 2018-02-18 (×7): qty 1

## 2018-02-18 NOTE — Progress Notes (Signed)
Physical Therapy Treatment Patient Details Name: Maria Avila MRN: [id number] DOB: Oct 10, 1950 Today's Date: 02/18/2018    History of Present Illness Maria Avila is a 67 y.o. female with a past medical history significant for NIDDM, HTN who presents with several months weight loss, malaise and weakness, now with periumbilical pain and vomiting.    PT Comments    Patient with limited tolerance to mobility requesting to lie back down prior to stand.  Encouraged to stand to move up toward [facility], but pt unable to sequence stepping sideways and incontinent of stool.  Moved to [facility] and required assist for all aspects of toileting.  She cannot tolerate mobilizing well enough to make it at home in current condition though spouse present and willing to help.  Feel she will need STSNF rehab prior to d/c home and encouraged spouse to continue to be present and to advocate for her care there as well.  PT to follow until d/c.  Follow Up Recommendations  SNF;Supervision/Assistance - 24 hour     Equipment Recommendations  Other (comment)(TBA)    Recommendations for Other Services       Precautions / Restrictions Precautions Precautions: Fall Precaution Comments: frequent stools    Mobility  Bed Mobility Overal bed mobility: Needs Assistance Bed Mobility: Rolling Rolling: Mod assist   Supine to sit: Max assist Sit to supine: Max assist   General bed mobility comments: assist for rolling for hygiene; assist for trunk and LE's for supine <>sit  Transfers Overall transfer level: Needs assistance Equipment used: None Transfers: Sit to/from Stand;Stand Pivot Transfers Sit to Stand: Max assist Stand pivot transfers: Max assist       General transfer comment: lifting help to stand with blocking knees, then to [facility] due to incontinent of stool with max A and cues for safety  Ambulation/Gait Ambulation/Gait assistance: Max assist Gait Distance (Feet): (minimal steps)   Gait  Pattern/deviations: Step-to pattern;Trunk flexed     General Gait Details: attempted side stepping up to [facility], but pt not following commands well enough to step sideways   Stairs             Wheelchair Mobility    Modified Rankin (Stroke Patients Only)       Balance Overall balance assessment: Needs assistance Sitting-balance support: Feet supported;Bilateral upper extremity supported Sitting balance-Leahy Scale: Poor Sitting balance - Comments: falling back in sitting even after positioned with feet supported at EOB with turnk over hips; not supporting herself or encorporating balance reactions   Standing balance support: Bilateral upper extremity supported Standing balance-Leahy Scale: Poor Standing balance comment: holding onto PT and with mod A for balance in standing                            Cognition Arousal/Alertness: Lethargic Behavior During Therapy: Flat affect Overall Cognitive Status: Impaired/Different from baseline Area of Impairment: Attention;Following commands;Problem solving                   Current Attention Level: Sustained   Following Commands: Follows one step commands inconsistently;Follows one step commands with increased time     Problem Solving: Slow processing;Difficulty sequencing;Requires verbal cues        Exercises      General Comments General comments (skin integrity, edema, etc.): spouse in room throughout and attempting to assist; patient continued to state "I'm done" depsite cues and encouragement to sit or stand longer to take steps toward  HOB      Pertinent Vitals/Pain Faces Pain Scale: Hurts little more Pain Location: generalized esp rectum  Pain Descriptors / Indicators: Grimacing;Guarding Pain Intervention(s): Monitored during session;Repositioned;Limited activity within patient's tolerance    Home Living                      Prior Function            PT Goals (current goals can  now be found in the care plan section) Progress towards PT goals: Not progressing toward goals - comment    Frequency    Min 2X/week      PT Plan Discharge plan needs to be updated    Co-evaluation              AM-PAC PT "6 Clicks" Daily Activity  Outcome Measure  Difficulty turning over in bed (including adjusting bedclothes, sheets and blankets)?: Unable Difficulty moving from lying on back to sitting on the side of the bed? : Unable Difficulty sitting down on and standing up from a chair with arms (e.g., wheelchair, bedside commode, etc,.)?: Unable Help needed moving to and from a bed to chair (including a wheelchair)?: A Lot Help needed walking in hospital room?: A Lot Help needed climbing 3-5 steps with a railing? : Total 6 Click Score: 8    End of Session Equipment Utilized During Treatment: Gait belt Activity Tolerance: Patient limited by lethargy;Patient limited by fatigue Patient left: in bed;with call bell/phone within reach;with bed alarm set;with family/visitor present Nurse Communication: Mobility status;Other (comment)(need for SNF) PT Visit Diagnosis: Muscle weakness (generalized) (M62.81);Adult, failure to thrive (R62.7);Other abnormalities of gait and mobility (R26.89);History of falling (Z91.81)     Time: 4010-2725 PT Time Calculation (min) (ACUTE ONLY): 25 min  Charges:  $Therapeutic Activity: 23-37 mins                     Magda Kiel, [place] Acute Rehabilitation Services [phone] 02/18/2018    Reginia Naas 02/18/2018, 3:42 PM

## 2018-02-18 NOTE — Clinical Social Work Note (Signed)
Clinical Social Work Assessment  Patient Details  Name: Maliea E Martinique MRN: [id number] Date of Birth: Aug 23, 1950  Date of referral:  02/18/18               Reason for consult:  Discharge Planning                Permission sought to share information with:  Facility Sport and exercise psychologist, Family Supports Permission granted to share information::  Yes, Verbal Permission Granted  Name::     [name] Martinique  Agency::  [facility]NF[facility]  Relationship::  husband  Contact Information:  [phone]  Housing/Transportation Living arrangements for the past 2 months:  [place] of Information:  Patient, Spouse Patient Interpreter Needed:  None Criminal Activity/Legal Involvement Pertinent to Current Situation/Hospitalization:  No - Comment as needed Significant Relationships:  Adult Children, Other Family Members, Spouse Lives with:  Spouse Do you feel safe going back to the place where you live?  Yes Need for family participation in patient care:  Yes (Comment)  Care giving concerns: Pt with increased weakness, lethargy and inability to care for self. Pt husband provides assistance for pt at home but is unable to properly assist with current weakness.    Social Worker assessment / plan:  CSW met with pt at bedside, pt and pt husband both amenable to [place] visit. Introduced self and role, and reason for visit. Pt states that she receives assistance from her husband, and pt husband agrees. Pt unable to complete ADLs and IADLs independently. Discussed home health vs. SNF, pt okay with placement. Pt husband given packet and offers that CSW currently has. Discussed urgency of picking a SNF as pt is medically managed and ready for discharge. Pt husband states understanding, would like any additional offers and will make choice in the morning. Hand off given for CSW covering tomorrow to follow up with pt husband.   Employment status:  Retired Nurse, adult PT  Recommendations:  [place], [place] / Referral to community resources:  [place]  Patient/Family's Response to care:  Pt and pt husband amenable to [place] visit, SNF recommendations and making decision.   Patient/Family's Understanding of and Emotional Response to Diagnosis, Current Treatment, and Prognosis:  Pt and pt husband state understanding of diagnosis, current treatment and prognosis. Pt and pt husband interested in SNF for pt to receive regular therapies and get stronger prior to returning home.   Emotional Assessment Appearance:  Appears stated age Attitude/Demeanor/Rapport:  Lethargic Affect (typically observed):  Accepting, Quiet Orientation:  Oriented to Self, Oriented to Place, Oriented to  Time, Oriented to Situation Alcohol / Substance use:  Not Applicable(hx of ETOH use) Psych involvement (Current and /or in the community):  No (Comment)  Discharge Needs  Concerns to be addressed:  Care Coordination Readmission within the last 30 days:  Yes Current discharge risk:  Dependent with Mobility, Chronically ill, Physical Impairment Barriers to Discharge:  Ship broker, Continued Medical Work up   [employer], [place] 02/18/2018, 4:39 PM

## 2018-02-18 NOTE — Progress Notes (Signed)
PT Cancellation Note  Patient Details Name: Maria Avila MRN: [id number] DOB: May 23, 1950   Cancelled Treatment:    Reason Eval/Treat Not Completed: Patient at procedure or test/unavailable; patient out of room for testing.  Will attempt later as time permits.   Reginia Naas 02/18/2018, 11:37 AM Magda Kiel, [address] [phone] 02/18/2018

## 2018-02-18 NOTE — Social Work (Signed)
CSW received verbal consult from RN Case Manager that pt requesting SNF.  Will be unable to place pt today due to insurance authorization, and lack of facility choice. Will try and begin SNF process today.  Westley Hummer, MSW, [place] Work [phone]

## 2018-02-18 NOTE — Plan of Care (Signed)
Was off floor for HIDA when I went to see her this morning.  Howie Ill will revisit tomorrow.

## 2018-02-18 NOTE — Progress Notes (Signed)
Please see the attestation on discharge summary dated 02/17/18, which is My evaluation from today 02/18/18.  My evaluation from yesterday is in Progress note from 02/18/16.

## 2018-02-18 NOTE — Care Management Note (Signed)
Case Management Note  Patient Details  Name: Kairie E Martinique MRN: [id number] Date of Birth: September 01, 1950  Subjective/Objective:                    Action/Plan:  Received home health orders. Went to patient's room. Husband and PT at bedside. Husband stated he was unaware patient was discharging home today. Per nurse husband spoke with NP and NP will have MD speak with patient.   Will wait until PT finishes eval and revisit patient and husband.  Expected Discharge Date:  02/18/18               Expected Discharge Plan:     In-House Referral:     Discharge planning Services  CM Consult  Post Acute Care Choice:  Durable Medical Equipment, Home Health Choice offered to:  Spouse, Patient  DME Arranged:    DME Agency:     HH Arranged:    [place] Agency:     Status of Service:  In process, will continue to follow  If discussed at Long Length of Stay Meetings, dates discussed:    Additional Comments:  Marilu Favre, RN 02/18/2018, 3:12 PM

## 2018-02-18 NOTE — NC FL2 (Signed)
[place] LEVEL OF CARE SCREENING TOOL     IDENTIFICATION  Patient Name: Maria Avila Birthdate: [birth year] Sex: female Admission Date (Current Location): 02/16/2018  [facility] and [place] Number:  Herbalist and Address:  [facility] [place] [address], [place], [place] [address]      Provider Number: [id number]  Attending Physician Name and Address:  Damita Lack, MD  Relative Name and Phone Number:  [name] Avila; husband; [phone]    Current Level of Care: Hospital Recommended Level of Care: [place] Prior Approval Number:    Date Approved/Denied:   PASRR Number: [id number]  Discharge Plan: SNF    Current Diagnoses: Patient Active Problem List   Diagnosis Date Noted  . Malnutrition of moderate degree 02/17/2018  . Colitis 02/16/2018  . Weakness 02/16/2018  . Nausea vomiting and diarrhea 02/16/2018  . Diabetes ([place]) 02/16/2018  . Essential hypertension 02/16/2018  . Anemia 02/16/2018    Orientation RESPIRATION BLADDER Height & Weight     Self, Situation, Place, Time  Normal Incontinent, External catheter Weight: 107 lb 5.8 oz (48.7 kg) Height:  5\' 2"  (157.5 cm)  BEHAVIORAL SYMPTOMS/MOOD NEUROLOGICAL BOWEL NUTRITION STATUS      Incontinent Diet(see discharge summary)  AMBULATORY STATUS COMMUNICATION OF NEEDS Skin   Extensive Assist Verbally Normal                       Personal Care Assistance Level of Assistance  Bathing, Feeding, Dressing Bathing Assistance: Maximum assistance Feeding assistance: Limited assistance Dressing Assistance: Maximum assistance     Functional Limitations Info  Speech, Hearing, Sight Sight Info: Impaired Hearing Info: Adequate Speech Info: Adequate    SPECIAL CARE FACTORS FREQUENCY  OT (By licensed OT), PT (By licensed PT)     PT Frequency: 5x week OT Frequency: 5x week            Contractures Contractures Info: Not present     Additional Factors Info  Code Status, Allergies Code Status Info: Full Code Allergies Info: CRESTOR ROSUVASTATIN CALCIUM, ZOCOR SIMVASTATIN            Current Medications (02/18/2018):  This is the current hospital active medication list Current Facility-Administered Medications  Medication Dose Route Frequency Provider Last Rate Last Dose  . 0.9 %  sodium chloride infusion   Intravenous Continuous Radene Gunning, NP 50 mL/hr at 02/18/18 1320    . aspirin EC tablet 81 mg  81 mg Oral Daily Edwin Dada, MD   81 mg at 02/18/18 1452  . enoxaparin (LOVENOX) injection 30 mg  30 mg Subcutaneous Q24H Danford, Suann Larry, MD   30 mg at 02/18/18 1451  . feeding supplement (ENSURE ENLIVE) (ENSURE ENLIVE) liquid 237 mL  237 mL Oral TID BM Amin, Ankit Chirag, MD   237 mL at 02/18/18 1451  . Influenza vac split quadrivalent PF (FLUZONE HIGH-DOSE) injection 0.5 mL  0.5 mL Intramuscular Tomorrow-1000 Danford, Suann Larry, MD      . lipase/protease/amylase (CREON) capsule 24,000 Units  24,000 Units Oral TID WC Arta Silence, MD   24,000 Units at 02/17/18 1928  . lisinopril (PRINIVIL,ZESTRIL) tablet 10 mg  10 mg Oral Daily Danford, Suann Larry, MD   10 mg at 02/18/18 1452  . MEDLINE mouth rinse  15 mL Mouth Rinse BID Edwin Dada, MD   15 mL at 02/17/18 2107  . mirtazapine (REMERON) tablet 7.5 mg  7.5 mg Oral QHS  Radene Gunning, NP   7.5 mg at 02/17/18 2109  . multivitamin with minerals tablet 1 tablet  1 tablet Oral Daily Amin, Jeanella Flattery, MD   1 tablet at 02/18/18 1452  . ondansetron (ZOFRAN) tablet 4 mg  4 mg Oral Q6H PRN Danford, Suann Larry, MD       Or  . ondansetron (ZOFRAN) injection 4 mg  4 mg Intravenous Q6H PRN Danford, Suann Larry, MD      . pantoprazole (PROTONIX) EC tablet 40 mg  40 mg Oral Daily Danford, Suann Larry, MD   40 mg at 02/18/18 1452  . pravastatin (PRAVACHOL) tablet 40 mg  40 mg Oral QPM Danford, Suann Larry, MD   40 mg at 02/17/18 1928      Discharge Medications: Please see discharge summary for a list of discharge medications.  Relevant Imaging Results:  Relevant Lab Results:   Additional Information [id number] [facility] [place], [place]

## 2018-02-19 DIAGNOSIS — Z7401 Bed confinement status: Secondary | ICD-10-CM | POA: Diagnosis not present

## 2018-02-19 DIAGNOSIS — R918 Other nonspecific abnormal finding of lung field: Secondary | ICD-10-CM | POA: Diagnosis not present

## 2018-02-19 DIAGNOSIS — E440 Moderate protein-calorie malnutrition: Secondary | ICD-10-CM | POA: Diagnosis not present

## 2018-02-19 DIAGNOSIS — L89302 Pressure ulcer of unspecified buttock, stage 2: Secondary | ICD-10-CM | POA: Diagnosis present

## 2018-02-19 DIAGNOSIS — D696 Thrombocytopenia, unspecified: Secondary | ICD-10-CM | POA: Diagnosis not present

## 2018-02-19 DIAGNOSIS — D649 Anemia, unspecified: Secondary | ICD-10-CM | POA: Diagnosis not present

## 2018-02-19 DIAGNOSIS — Z820 Family history of epilepsy and other diseases of the nervous system: Secondary | ICD-10-CM | POA: Diagnosis not present

## 2018-02-19 DIAGNOSIS — E46 Unspecified protein-calorie malnutrition: Secondary | ICD-10-CM | POA: Diagnosis not present

## 2018-02-19 DIAGNOSIS — K640 First degree hemorrhoids: Secondary | ICD-10-CM | POA: Diagnosis not present

## 2018-02-19 DIAGNOSIS — Z888 Allergy status to other drugs, medicaments and biological substances status: Secondary | ICD-10-CM | POA: Diagnosis not present

## 2018-02-19 DIAGNOSIS — Z87891 Personal history of nicotine dependence: Secondary | ICD-10-CM | POA: Diagnosis not present

## 2018-02-19 DIAGNOSIS — D509 Iron deficiency anemia, unspecified: Secondary | ICD-10-CM | POA: Diagnosis not present

## 2018-02-19 DIAGNOSIS — D72829 Elevated white blood cell count, unspecified: Secondary | ICD-10-CM | POA: Diagnosis not present

## 2018-02-19 DIAGNOSIS — R531 Weakness: Secondary | ICD-10-CM | POA: Diagnosis not present

## 2018-02-19 DIAGNOSIS — E860 Dehydration: Secondary | ICD-10-CM | POA: Diagnosis not present

## 2018-02-19 DIAGNOSIS — E876 Hypokalemia: Secondary | ICD-10-CM | POA: Diagnosis present

## 2018-02-19 DIAGNOSIS — E0800 Diabetes mellitus due to underlying condition with hyperosmolarity without nonketotic hyperglycemic-hyperosmolar coma (NKHHC): Secondary | ICD-10-CM | POA: Diagnosis not present

## 2018-02-19 DIAGNOSIS — E119 Type 2 diabetes mellitus without complications: Secondary | ICD-10-CM | POA: Diagnosis not present

## 2018-02-19 DIAGNOSIS — F329 Major depressive disorder, single episode, unspecified: Secondary | ICD-10-CM | POA: Diagnosis present

## 2018-02-19 DIAGNOSIS — E785 Hyperlipidemia, unspecified: Secondary | ICD-10-CM | POA: Diagnosis not present

## 2018-02-19 DIAGNOSIS — Z7982 Long term (current) use of aspirin: Secondary | ICD-10-CM | POA: Diagnosis not present

## 2018-02-19 DIAGNOSIS — Z23 Encounter for immunization: Secondary | ICD-10-CM | POA: Diagnosis not present

## 2018-02-19 DIAGNOSIS — K2900 Acute gastritis without bleeding: Secondary | ICD-10-CM | POA: Diagnosis not present

## 2018-02-19 DIAGNOSIS — K529 Noninfective gastroenteritis and colitis, unspecified: Secondary | ICD-10-CM | POA: Diagnosis not present

## 2018-02-19 DIAGNOSIS — R2681 Unsteadiness on feet: Secondary | ICD-10-CM | POA: Diagnosis not present

## 2018-02-19 DIAGNOSIS — K5730 Diverticulosis of large intestine without perforation or abscess without bleeding: Secondary | ICD-10-CM | POA: Diagnosis not present

## 2018-02-19 DIAGNOSIS — I959 Hypotension, unspecified: Secondary | ICD-10-CM | POA: Diagnosis present

## 2018-02-19 DIAGNOSIS — D61818 Other pancytopenia: Secondary | ICD-10-CM

## 2018-02-19 DIAGNOSIS — M6281 Muscle weakness (generalized): Secondary | ICD-10-CM | POA: Diagnosis not present

## 2018-02-19 DIAGNOSIS — R4781 Slurred speech: Secondary | ICD-10-CM | POA: Diagnosis present

## 2018-02-19 DIAGNOSIS — E43 Unspecified severe protein-calorie malnutrition: Secondary | ICD-10-CM | POA: Diagnosis not present

## 2018-02-19 DIAGNOSIS — K449 Diaphragmatic hernia without obstruction or gangrene: Secondary | ICD-10-CM | POA: Diagnosis present

## 2018-02-19 DIAGNOSIS — R627 Adult failure to thrive: Secondary | ICD-10-CM | POA: Diagnosis not present

## 2018-02-19 DIAGNOSIS — R112 Nausea with vomiting, unspecified: Secondary | ICD-10-CM | POA: Diagnosis not present

## 2018-02-19 DIAGNOSIS — K6389 Other specified diseases of intestine: Secondary | ICD-10-CM | POA: Diagnosis not present

## 2018-02-19 DIAGNOSIS — I10 Essential (primary) hypertension: Secondary | ICD-10-CM | POA: Diagnosis not present

## 2018-02-19 DIAGNOSIS — R197 Diarrhea, unspecified: Secondary | ICD-10-CM | POA: Diagnosis not present

## 2018-02-19 DIAGNOSIS — M2550 Pain in unspecified joint: Secondary | ICD-10-CM | POA: Diagnosis not present

## 2018-02-19 DIAGNOSIS — Z800 Family history of malignant neoplasm of digestive organs: Secondary | ICD-10-CM | POA: Diagnosis not present

## 2018-02-19 DIAGNOSIS — Z681 Body mass index (BMI) 19 or less, adult: Secondary | ICD-10-CM | POA: Diagnosis not present

## 2018-02-19 LAB — CBC
HCT: 21 % — ABNORMAL LOW (ref 36.0–46.0)
HEMOGLOBIN: 7.3 g/dL — AB (ref 12.0–15.0)
MCH: 33.6 pg (ref 26.0–34.0)
MCHC: 34.8 g/dL (ref 30.0–36.0)
MCV: 96.8 fL (ref 80.0–100.0)
Platelets: 62 10*3/uL — ABNORMAL LOW (ref 150–400)
RBC: 2.17 MIL/uL — AB (ref 3.87–5.11)
RDW: 12.7 % (ref 11.5–15.5)
WBC: 3.7 10*3/uL — ABNORMAL LOW (ref 4.0–10.5)
nRBC: 0 % (ref 0.0–0.2)

## 2018-02-19 LAB — BASIC METABOLIC PANEL
Anion gap: <3 — ABNORMAL LOW (ref 5–15)
CHLORIDE: 119 mmol/L — AB (ref 98–111)
CO2: 19 mmol/L — ABNORMAL LOW (ref 22–32)
Calcium: 7.6 mg/dL — ABNORMAL LOW (ref 8.9–10.3)
Creatinine, Ser: 0.42 mg/dL — ABNORMAL LOW (ref 0.44–1.00)
GFR calc Af Amer: >60 mL/min (ref >60)
GFR calc non Af Amer: >60 mL/min (ref >60)
GLUCOSE: 78 mg/dL (ref 70–99)
POTASSIUM: 3.4 mmol/L — AB (ref 3.5–5.1)
Sodium: 139 mmol/L (ref 135–145)

## 2018-02-19 LAB — RETICULOCYTES
Immature Retic Fract: 0 % — ABNORMAL LOW (ref 2.3–15.9)
RBC.: 2.08 MIL/uL — AB (ref 3.87–5.11)
RETIC CT PCT: 0.2 % — AB (ref 0.4–3.1)
Retic Count, Absolute: 4.2 10*3/uL — ABNORMAL LOW (ref 19.0–186.0)

## 2018-02-19 LAB — PREPARE RBC (CROSSMATCH)

## 2018-02-19 LAB — HEMOGLOBIN AND HEMATOCRIT, BLOOD
HEMATOCRIT: 20 % — AB (ref 36.0–46.0)
HEMOGLOBIN: 6.8 g/dL — AB (ref 12.0–15.0)

## 2018-02-19 LAB — ABO/RH: ABO/RH(D): O POS

## 2018-02-19 LAB — LACTATE DEHYDROGENASE: LDH: 116 U/L (ref 98–192)

## 2018-02-19 LAB — MAGNESIUM: Magnesium: 1.6 mg/dL — ABNORMAL LOW (ref 1.7–2.4)

## 2018-02-19 MED ORDER — MAGNESIUM SULFATE 2 GM/50ML IV SOLN
2.0000 g | Freq: Once | INTRAVENOUS | Status: AC
  Administered 2018-02-19: 2 g via INTRAVENOUS
  Filled 2018-02-19 (×2): qty 50

## 2018-02-19 MED ORDER — SODIUM CHLORIDE 0.9% IV SOLUTION
Freq: Once | INTRAVENOUS | Status: AC
  Administered 2018-02-19: 17:00:00 via INTRAVENOUS

## 2018-02-19 MED ORDER — SODIUM CHLORIDE 0.9 % IV SOLN
INTRAVENOUS | Status: DC
  Administered 2018-02-19 – 2018-02-22 (×5): via INTRAVENOUS

## 2018-02-19 MED ORDER — SODIUM CHLORIDE 0.9 % IV BOLUS
500.0000 mL | Freq: Once | INTRAVENOUS | Status: AC
  Administered 2018-02-19: 500 mL via INTRAVENOUS

## 2018-02-19 NOTE — Care Management Note (Addendum)
Case Management Note  Patient Details  Name: Maria Avila MRN: [id number] Date of Birth: July 02, 1950  Subjective/Objective:                    Action/Plan:  Spoke to husband and patient at bedside. Husband has reviewed SNF bed offers and decided he would like to take wife home with home health. Husband requesting 3 in 1 and wheel chair . Same ordered through [facility]. Referral for home health given to [facility] with [facility].    Shortly after speaking with husband, he has decided to have wife go to SNF short term. Social worker also spoke with husband to confirm. Text page MD. Bedside nurse aware.  Cancelled home health referral and DME request.  Expected Discharge Date:  02/19/18               Expected Discharge Plan:  [place]  In-House Referral:     Discharge planning Services  CM Consult  Post Acute Care Choice:  Durable Medical Equipment, Home Health Choice offered to:  Spouse, Patient  DME Arranged:  3-N-1, Youth worker wheelchair with seat cushion DME Agency:  [place]:  RN, Disease Management, PT, Nurse's Aide, Social Work [employer] Agency:  [place]  Status of Service:  Completed, signed off  If discussed at [employer], dates discussed:    Additional Comments:  Maria Favre, RN 02/19/2018, 11:04 AM

## 2018-02-19 NOTE — Progress Notes (Signed)
Pt's BP 82/67 this am;pulse 74,respirations 18,O2 sat 100% on RA. Unable to obtain temp. Pt drowsy but arousable,moaning but says she is only "hurting a little" . She could not specify where her pain was. Speech clear. When I asked her to squeeze my hands she said she had already done that this morning. Her left hand grip felt weaker than the right. When I tried to check her pupils she looked at me but turned her head away and did not cooperate with the exam. On call for [facility] paged.

## 2018-02-19 NOTE — Progress Notes (Signed)
Hgb trending down-- plan to transfuse 2 units Monitor blood pressure

## 2018-02-19 NOTE — Care Management Obs Status (Signed)
[place] NOTIFICATION   Patient Details  Name: Maria Avila MRN: [id number] Date of Birth: [birth year]   Medicare Observation Status Notification Given:       Marilu Favre, RN 02/19/2018, 9:05 AM

## 2018-02-19 NOTE — Care Management (Signed)
    Durable Medical Equipment  (From admission, onward)         Start     Ordered   02/19/18 1038  For home use only DME 3 n 1  Once     02/19/18 1037   02/19/18 1037  For home use only DME lightweight manual wheelchair with seat cushion  Once    Comments:  Patient suffers from [facility]  which impairs their ability to perform daily activities like ambulating in the home .  A cane  will not resolve  issue with performing activities of daily living. A wheelchair will allow patient to safely perform daily activities. Patient is not able to propel themselves in the home using a standard weight wheelchair due to [facility] . Patient can self propel in the lightweight wheelchair.  Accessories: elevating leg rests (ELRs), wheel locks, extensions and anti-tippers.   02/19/18 1037

## 2018-02-19 NOTE — Clinical Social Work Note (Addendum)
Met with patient, husband, and another family member (son?). Patient's spouse has reviewed SNF ratings and wants to take her home with home health. RNCM aware.  CSW signing off.   Dayton Scrape, [place] [phone]  11:13 am Patient's husband now wanting [facility] They will start authorization.  Dayton Scrape, CSW [phone]  12:53 pm Discharge order cancelled. MD hopeful she can discharge tomorrow. SNF aware. Authorization still pending.  Dayton Scrape, [place] [phone]  2:38 pm Patient still able to discharge to SNF today but insurance authorization is still pending.  Dayton Scrape, [place]

## 2018-02-19 NOTE — Progress Notes (Signed)
   02/19/18 1000  Clinical Encounter Type  Visited With Family  Visit Type Initial;Psychological support  Referral From Other (Comment) (met outside pt rm)  Stress Factors  Family Stress Factors Other (Comment)   Met pt's husband and son outside the room. Supportive listening to husband's concerns about his wife's health.  Myra Gianotti resident, [phone]

## 2018-02-19 NOTE — Progress Notes (Addendum)
[facility] PROGRESS NOTE  Maria Avila MEQ:[id number] DOB: [birth year] DOA: 02/16/2018 PCP: [name], MD  67 year old with history of diabetes mellitus type 2, essential hypertension came to the hospital with complaints of generalized weakness, malaise and periumbilical pain. Now with pancytopenia   Assessment/Plan:  Unintentional Weight loss/Weakness/Malaise/Dehydration -was seen by hematology in 07/2017 with normal peripheral smear, flow cytometry.  She has also been followed by GI with CT abdomen and pelvis, as well as EGD and colonoscopy.  -currently GI opine protien calore malnutrion with  Hypalbuminemia, steatosis liver, chronic diarrhea in setting of profound depression. Recommended evaluation for Hep C, Hida scan, avoid reglan, scheduled zofran and trial pancreatic enzymes -INR 1.25, ammonia 23 -HIDA scan -hep c negative  Pancytopenia -patient was only leukopenic upon eval by Dr. Osker Mason in the spring -check LDH, retic, haptoglobin -d/c lovenox-- add SCD -discussed with Dr. Osker Mason-- does not appear to be an acute hematologic issue... Monitor for now and treat underlying infection if any  Elevated bilirubin -dehydration? -trending down  Slurred speech and right sided weakness Family report the speech has been abnormal for weeks. Patient mumbles and needs encouragement to speak up. No slurred speech this am. Neuro exam benign. MRI brain without acute process.  -has been seen by psychiatry: added low dose remeron  Diarrhea - HIV negative. -appreciate GI evaluation  Hypokalemia/hypomagnesemia -replete  Diabetes diet-controlled  Hypertension -d/c home meds  Malnutrition -Consult nutrition   Called by nursing.  BP 76/58 and is trending down.  Will check both arms manually.  Recheck an H/H and type and cross.  Bolus IVF and start continuous infusion.  Patient is ill and requires further work up in the hospital.  No sign definitive sign of  infection but presentation/current vital signs are concerning for smoldering infection/bleed/decompensation.    Code Status: full Family Communication: none at bedside Disposition Plan: SNF When work up complete   Consultants:   gastroenterology   psychiatry  Procedures:  hida scan   Antibiotics:  none  HPI/Subjective: Slow to respond Not feeling well   Objective: Vitals:   02/19/18 0500 02/19/18 0638  BP: (!) 82/67 105/89  Pulse: 74 74  Resp: 18 18  Temp:    SpO2: 100% 100%    Intake/Output Summary (Last 24 hours) at 02/19/2018 1248 Last data filed at 02/19/2018 1024 Gross per 24 hour  Intake 0 ml  Output -  Net 0 ml   Filed Weights   02/16/18 0648  Weight: 48.7 kg    Exam:   General:  Slow to respond, laying in bed- mumbles few words  Cardiovascular: rrr  Respiratory: no increased work of breathing, no wheezing  Abdomen: +BS, soft, NT  Musculoskeletal: not cooperative with full exam, moved on own to turn over in the bed  Data Reviewed: Basic Metabolic Panel: Recent Labs  Lab 02/13/18 1801 02/16/18 0232 02/16/18 1158 02/17/18 0205 02/18/18 0314 02/19/18 0806  NA 136 138  --  139 137 139  K 3.1* 3.4*  --  3.4* 3.9 3.4*  CL 98 101  --  112* 114* 119*  CO2 23 24  --  20* 18* 19*  GLUCOSE 99 106*  --  92 119* 78  BUN 17 12  --  11 6* <5*  CREATININE 0.79 0.71  --  0.59 0.49 0.42*  CALCIUM 9.2 9.0  --  7.7* 7.3* 7.6*  MG  --   --  1.3*  --   --  1.6*   Liver Function Tests:  Recent Labs  Lab 02/13/18 1801 02/16/18 0459 02/17/18 0205 02/18/18 0314  AST 28 28 24  32  ALT 19 19 18 23   ALKPHOS 60 56 48 51  BILITOT 1.5* 1.3* 0.9 0.8  PROT 6.6 5.7* 4.7* 4.6*  ALBUMIN 2.7* 2.4* 2.1* 1.9*   Recent Labs  Lab 02/13/18 1801 02/16/18 0459  LIPASE 40 44   Recent Labs  Lab 02/16/18 0951  AMMONIA 23   CBC: Recent Labs  Lab 02/13/18 1801 02/16/18 0232 02/17/18 0205 02/19/18 0806  WBC 6.9 4.3 5.4 3.7*  HGB 12.1 10.9* 8.4*  7.3*  HCT 34.9* 32.2* 25.2*  23.7* 21.0*  MCV 98.3 98.2 98.1 96.8  PLT 188 169 122* 62*   Cardiac Enzymes: No results for input(s): CKTOTAL, CKMB, CKMBINDEX, TROPONINI in the last 168 hours. BNP (last 3 results) No results for input(s): BNP in the last 8760 hours.  ProBNP (last 3 results) No results for input(s): PROBNP in the last 8760 hours.  CBG: Recent Labs  Lab 02/16/18 0212  GLUCAP 80    Recent Results (from the past 240 hour(s))  Culture, blood (Routine X 2) w Reflex to ID Panel     Status: None (Preliminary result)   Collection Time: 02/16/18  5:00 AM  Result Value Ref Range Status   Specimen Description BLOOD LEFT ARM  Final   Special Requests   Final    BOTTLES DRAWN AEROBIC AND ANAEROBIC Blood Culture adequate volume   Culture   Final    NO GROWTH 3 DAYS Performed at [facility], [address], [place], [place] [address]    Report Status PENDING  Incomplete  Culture, blood (Routine X 2) w Reflex to ID Panel     Status: None (Preliminary result)   Collection Time: 02/16/18  5:50 AM  Result Value Ref Range Status   Specimen Description BLOOD RIGHT ARM  Final   Special Requests   Final    BOTTLES DRAWN AEROBIC AND ANAEROBIC Blood Culture adequate volume   Culture   Final    NO GROWTH 3 DAYS Performed at [facility], [address] [place], [place] [address]    Report Status PENDING  Incomplete     Studies: Nm Hepato W/eject Fract  Result Date: 02/18/2018 CLINICAL DATA:  Weight loss. EXAM: NUCLEAR MEDICINE HEPATOBILIARY IMAGING WITH GALLBLADDER EF TECHNIQUE: Sequential images of the abdomen were obtained out to 60 minutes following intravenous administration of radiopharmaceutical. After oral ingestion of Ensure, gallbladder ejection fraction was determined. At 60 min, normal ejection fraction is greater than 33%. RADIOPHARMACEUTICALS:  5.3 mCi Tc-20m  Choletec IV COMPARISON:  None. FINDINGS: Prompt uptake and biliary excretion of activity by  the liver is seen. Gallbladder activity is visualized, consistent with patency of cystic duct. Biliary activity passes into small bowel, consistent with patent common bile duct. The gallbladder emptied spontaneously before the Ensure. As a result, an ejection fraction cannot be accurately calculated. That being said, the gallbladder is not visualized after ensure and gallbladder emptying is near-complete. IMPRESSION: The gallbladder emptied spontaneously before consumption of Ensure. While an ejection fraction cannot be calculated, gallbladder emptying is near complete. Electronically Signed   By: Dorise Bullion III M.D   On: 02/18/2018 13:40    Scheduled Meds: . aspirin EC  81 mg Oral Daily  . feeding supplement (ENSURE ENLIVE)  237 mL Oral TID BM  . lipase/protease/amylase  24,000 Units Oral TID WC  . mouth rinse  15 mL Mouth Rinse BID  .  mirtazapine  7.5 mg Oral QHS  . multivitamin with minerals  1 tablet Oral Daily  . pantoprazole  40 mg Oral Daily  . pravastatin  40 mg Oral QPM   Continuous Infusions: . magnesium sulfate 1 - 4 g bolus IVPB      Principal Problem:   Malnutrition of moderate degree Active Problems:   Weakness   Nausea vomiting and diarrhea   Diabetes ([place])   Essential hypertension   Pancytopenia ([place])    Time spent: 45 minutes    Geradine Girt  [facility]  If 7PM-7AM, please contact night-coverage at [email or web address], password [facility] 02/19/2018, 12:48 PM  LOS: 0 days

## 2018-02-19 NOTE — Discharge Summary (Addendum)
D/c held due to developing hypotension/pancytopenia- needs IVF and further work up   Physician Discharge Summary  Maria Avila JJK:[id number] DOB: Apr 27, 1951 DOA: 02/16/2018  PCP: [name], MD  Admit date: 02/16/2018 Discharge date: 02/19/2018  Time spent: 45 minutes  Recommendations for Outpatient Follow-up:  1. Follow up with Dr. [facility] 2-3 weeks for post hospital visit 2. Eventually will need colonoscopy 3. Follow up with PCP 1-2 weeks for evaluation of appetite. Recommend cmet evaluate electrolytes and LFT's, cbc trend Hg 4. Cbc on Monday re: abnormal labs   Discharge Diagnoses:  Principal Problem:   Malnutrition of moderate degree Active Problems:   Weakness   Nausea vomiting and diarrhea   Diabetes ([place])   Essential hypertension   Pancytopenia ([place])   Discharge Condition: stable  Diet recommendation: regular  Filed Weights   02/16/18 0648  Weight: 48.7 kg    History of present illness:  Maria Avila is a 67 y.o. female with a past medical history significant for NIDDM, HTN who presented 10/15 to ed with several months weight loss, malaise and weakness, now with periumbilical pain and vomiting. Axsociated symptoms include unintentional weight loss, no appetite and malaise and generalized fatigue.  She was seen by her PCP, noted to have neutropenia, referred to hematology where flow cytometry smear and CT abdomen pelvis were unremarkable, and she was diagnosed with benign neutropenia.  Her symptoms persisted, she developed 4-5 loose stools per day most days, vomiting every couple days, persistent weight loss, and worsening fatigue.  She was evaluated by [facility], Dr. Therisa Doyne, and describes EGD and colonoscopy, which may have been normal, she does not know any abnormal results.  Finally, this past two weeks, she has been to the ER 4 times for weakness and been diagnosed with dehydration, given fluids and discharged.  On day of admission she had  some periumbilical pain, more vomiting, and returned to the ER.  Hospital Course:  Weight loss/Weakness/MalaiseDehydration  Patient presented with chronic progressive weakness anorexia, dehydration with tachycardia rate 117. Improved at discharge but remains weak. She received IV fluids and is taking  po fluids and nourishment.   She has been worked up so far by hematology and gastroenterology with normal peripheral smear, flow cytometry, and CT abdomen and pelvis, as well as EGD and colonoscopy. Evaluated by gi who opine protien calore malnutrion with  Hypalbuminemia, steatosis liver, chronic diarrhea in setting of profound depression. INR 1.25, ammonia 23 HIDA scan with the gallbladder emptied spontaneously before consumption of Ensure.While an ejection fraction cannot be calculated, gallbladder emptying is near complete. Spoke with Dr Therisa Doyne who stated patient needs colonoscopy but must be able to tolerate prep. Can do outpatient. Dont think she would be able to tolerate prep.  -small frequent meal -zofran -remeron -pancrelipase -Follow up with Dr Therisa Doyne 1-2 weeks -evaluated by psychiatry who recommend remeron  Elevated bilirubin -ammonia 23, bilirubin o.4 -OP follow up  Slurred speech and right sided weakness Family report the speech has been abnormal for weeks. Patient mumbles and needs encouragement to speak up. No slurred speech this am. Neuro exam benign. MRI brain without acute process. -has been seen by psychiatry: added low dose remeron  Diarrhea Chronic.  HIV negative. -appreciate GI  Hypokalemia/hypomagnesemia -repleted -close OP follow up  Pancytopenia -patient was only leukopenic upon eval by Dr. Osker Mason in the spring -d/c lovenox-- add SCD -discussed with Dr. Osker Mason-- does not appear to be an acute hematologic issue... Monitor for now  -outpatient follow  up  Diabetes Is diet-controlled   Malnutrition -Consult nutrition  Procedures:  HIDA  scan  Consultations:  [facility]  psychiatry  Discharge Exam: Vitals:   02/19/18 0500 02/19/18 0638  BP: (!) 82/67 105/89  Pulse: 74 74  Resp: 18 18  Temp:    SpO2: 100% 100%     Discharge Instructions   Discharge Instructions    Diet general   Complete by:  As directed    Discharge instructions   Complete by:  As directed    Follow up with Dr. [facility] 2-3 weeks   Increase activity slowly   Complete by:  As directed      Allergies as of 02/19/2018      Reactions   Crestor [rosuvastatin Calcium] Swelling, Other (See Comments)   Swelling of legs and muscle weakness   Zocor [simvastatin] Swelling, Other (See Comments)   Swelling of legs and muscle weakness      Medication List    TAKE these medications   aspirin EC 81 MG tablet Take 81 mg by mouth daily.   Fish Oil 1000 MG Caps Take 1 capsule by mouth daily.   lisinopril 10 MG tablet Commonly known as:  PRINIVIL,ZESTRIL Take 10 mg by mouth daily.   mirtazapine 7.5 MG tablet Commonly known as:  REMERON Take 1 tablet (7.5 mg total) by mouth at bedtime.   multivitamin capsule Take 1 capsule by mouth daily.   naproxen sodium 220 MG tablet Commonly known as:  ALEVE Take 220 mg by mouth 2 (two) times daily as needed.   ondansetron 4 MG disintegrating tablet Commonly known as:  ZOFRAN-ODT Take 1 tablet (4 mg total) by mouth every 6 (six) hours as needed.   Pancrelipase (Lip-Prot-Amyl) 24000-76000 units Cpep Take 1 capsule (24,000 Units total) by mouth 3 (three) times daily with meals.   pantoprazole 40 MG tablet Commonly known as:  PROTONIX Take 40 mg by mouth daily.   pravastatin 40 MG tablet Commonly known as:  PRAVACHOL Take 40 mg by mouth every evening.   promethazine 12.5 MG tablet Commonly known as:  PHENERGAN Take 12.5 mg by mouth every 6 (six) hours as needed for nausea.   Vitamin D 2000 units Caps Take 1 capsule by mouth daily.             Durable Medical Equipment  (From admission, onward)         Start     Ordered   02/19/18 1038  For home use only DME 3 n 1  Once     02/19/18 1037   02/19/18 1037  For home use only DME lightweight manual wheelchair with seat cushion  Once    Comments:  Patient suffers from [facility]  which impairs their ability to perform daily activities like ambulating in the home .  A cane  will not resolve  issue with performing activities of daily living. A wheelchair will allow patient to safely perform daily activities. Patient is not able to propel themselves in the home using a standard weight wheelchair due to [facility] . Patient can self propel in the lightweight wheelchair.  Accessories: elevating leg rests (ELRs), wheel locks, extensions and anti-tippers.   02/19/18 1037         Allergies  Allergen Reactions  . Crestor [Rosuvastatin Calcium] Swelling and Other (See Comments)    Swelling of legs and muscle weakness  . Zocor [Simvastatin] Swelling and Other (See Comments)    Swelling of legs and muscle  weakness    Contact information for follow-up providers    [name], MD Follow up in 1 week(s).   Specialty:  Internal Medicine Contact information: [address] [place] [address] [phone]            Contact information for after-discharge care    Destination    [facility] Preferred SNF .   Service:  Skilled Nursing Contact information: [address] [place] [phone]                   The results of significant diagnostics from this hospitalization (including imaging, microbiology, ancillary and laboratory) are listed below for reference.    Significant Diagnostic Studies: Ct Head Wo Contrast  Result Date: 02/16/2018 CLINICAL DATA:  Generalize weakness and slurred speech for 2 weeks. EXAM: CT HEAD WITHOUT CONTRAST TECHNIQUE: Contiguous axial images  were obtained from the base of the skull through the vertex without intravenous contrast. COMPARISON:  04/01/2005 FINDINGS: Brain: No evidence of acute infarction, hemorrhage, hydrocephalus, extra-axial collection or mass lesion/mass effect. Vascular: Mild intracranial arterial vascular calcifications. Skull: Calvarium appears intact. Sinuses/Orbits: Paranasal sinuses and mastoid air cells are clear. Other: None. IMPRESSION: No acute intracranial abnormality. Electronically Signed   By: Lucienne Capers M.D.   On: 02/16/2018 06:24   Mr Brain Wo Contrast  Result Date: 02/16/2018 CLINICAL DATA:  Weight loss and weakness. EXAM: MRI HEAD WITHOUT CONTRAST TECHNIQUE: Multiplanar, multiecho pulse sequences of the brain and surrounding structures were obtained without intravenous contrast. COMPARISON:  CT same day FINDINGS: Brain: Generalized atrophy. Diffusion imaging does not show any acute or subacute infarction. The brainstem and cerebellum are normal. Mild chronic small-vessel ischemic change of the cerebral hemispheric deep white matter. No cortical or large vessel territory infarction. No mass lesion, hemorrhage, hydrocephalus or extra-axial collection. Vascular: Major vessels at the base of the brain show flow. Skull and upper cervical spine: Negative Sinuses/Orbits: Clear/normal Other: None IMPRESSION: No acute or reversible finding. Generalized atrophy. Mild chronic small-vessel ischemic change of the cerebral hemispheric white matter. Electronically Signed   By: Nelson Chimes M.D.   On: 02/16/2018 21:52   Nm Hepato W/eject Fract  Result Date: 02/18/2018 CLINICAL DATA:  Weight loss. EXAM: NUCLEAR MEDICINE HEPATOBILIARY IMAGING WITH GALLBLADDER EF TECHNIQUE: Sequential images of the abdomen were obtained out to 60 minutes following intravenous administration of radiopharmaceutical. After oral ingestion of Ensure, gallbladder ejection fraction was determined. At 60 min, normal ejection fraction is greater  than 33%. RADIOPHARMACEUTICALS:  5.3 mCi Tc-29m  Choletec IV COMPARISON:  None. FINDINGS: Prompt uptake and biliary excretion of activity by the liver is seen. Gallbladder activity is visualized, consistent with patency of cystic duct. Biliary activity passes into small bowel, consistent with patent common bile duct. The gallbladder emptied spontaneously before the Ensure. As a result, an ejection fraction cannot be accurately calculated. That being said, the gallbladder is not visualized after ensure and gallbladder emptying is near-complete. IMPRESSION: The gallbladder emptied spontaneously before consumption of Ensure. While an ejection fraction cannot be calculated, gallbladder emptying is near complete. Electronically Signed   By: Dorise Bullion III M.D   On: 02/18/2018 13:40   Ct Angio Abd/pel W And/or Wo Contrast  Result Date: 02/05/2018 CLINICAL DATA:  Persistent acute abdominal pain, nausea, emesis and diarrhea EXAM: CT ANGIOGRAPHY ABDOMEN AND PELVIS WITH CONTRAST AND WITHOUT CONTRAST TECHNIQUE: Multidetector CT imaging of the abdomen and pelvis was performed using the standard protocol during bolus administration of intravenous  contrast. Multiplanar reconstructed images and MIPs were obtained and reviewed to evaluate the vascular anatomy. CONTRAST:  132mL ISOVUE-370 IOPAMIDOL (ISOVUE-370) INJECTION 76% COMPARISON:  07/29/2017 FINDINGS: VASCULAR Aorta: Aorta is atherosclerotic and mildly ectatic, proximal aortic diameter is 2.8 cm in the suprarenal aorta. Negative for significant aneurysm, dissection, occlusive process, retroperitoneal hemorrhage/hematoma, or rupture. Celiac: Widely patent including its branches SMA: Widely patent including its branches Renals: Widely patent bilaterally.  No accessory renal artery. IMA: Remains patent off the distal aorta including its branches Inflow: Atherosclerotic and tortuous iliac vessels without inflow disease or occlusion. No iliac aneurysm or dissection. The  common, internal and external iliac arteries remain patent. Proximal Outflow: The common femoral, proximal profunda femoral, and superficial femoral arteries demonstrated are also widely patent. Veins: No veno-occlusive process. Review of the MIP images confirms the above findings. NON-VASCULAR Lower chest: No acute abnormality. Hepatobiliary: Diffuse hypoattenuation of the liver compatible with hepatic steatosis. No biliary dilatation or focal hepatic abnormality. Gallbladder mildly distended, nonspecific. Common bile duct nondilated. Pancreas: Unremarkable. No pancreatic ductal dilatation or surrounding inflammatory changes. Spleen: Normal in size without focal abnormality. Adrenals/Urinary Tract: Adrenal glands are unremarkable. Kidneys are normal, without renal calculi, focal lesion, or hydronephrosis. Bladder is unremarkable. Stomach/Bowel: Negative for bowel obstruction, significant dilatation, ileus, or free air. No fluid collection, abscess or ascites. Scattered colonic diverticulosis. Diffuse mild colonic wall thickening/edema suggesting nonspecific colitis. Appendix unremarkable. Lymphatic: No adenopathy. Reproductive: Uterus and bilateral adnexa are unremarkable. Other: No abdominal wall hernia or abnormality. No abdominopelvic ascites. Musculoskeletal: Degenerative changes of the spine. Multilevel facet arthropathy from L2-S1. Acute osseous finding. IMPRESSION: VASCULAR Aortoiliac atherosclerosis, mild ectasia and tortuosity without acute vascular process. Maximal diameter 2.8 cm as above. Ectatic abdominal aorta at risk for aneurysm development. Recommend followup by ultrasound in 5 years. This recommendation follows ACR consensus guidelines: White Paper of the ACR Incidental Findings Committee II on Vascular Findings. J Am Coll Radiol 2013; 10:789-794. Patent mesenteric and renal vasculature without occlusive disease Patent tortuous iliac vessels without inflow disease. NON-VASCULAR Hepatic steatosis  Diffuse colonic wall thickening/edema compatible with nonspecific colitis. Scattered minor diverticulosis as well. No fluid collection, abscess, perforation, obstruction pattern or ascites. Electronically Signed   By: Jerilynn Mages.  Shick M.D.   On: 02/05/2018 19:50   US Abdomen Limited Ruq  Result Date: 02/05/2018 CLINICAL DATA:  Abdominal pain. EXAM: ULTRASOUND ABDOMEN LIMITED RIGHT UPPER QUADRANT COMPARISON:  None. FINDINGS: Gallbladder: No gallstones or wall thickening visualized. No sonographic Murphy sign noted by sonographer. Common bile duct: Diameter: 6.9 mm Liver: Hepatic steatosis. No focal mass. Portal vein is patent on color Doppler imaging with normal direction of blood flow towards the liver. IMPRESSION: 1. The gallbladder is normal in appearance. 2. The common bile duct measures 6.9 mm which is borderline for age. Recommend correlation with labs. If there is concern for biliary obstruction, recommend an MRCP or ERCP. Electronically Signed   By: Dorise Bullion III M.D   On: 02/05/2018 21:23    Microbiology: Recent Results (from the past 240 hour(s))  Culture, blood (Routine X 2) w Reflex to ID Panel     Status: None (Preliminary result)   Collection Time: 02/16/18  5:00 AM  Result Value Ref Range Status   Specimen Description BLOOD LEFT ARM  Final   Special Requests   Final    BOTTLES DRAWN AEROBIC AND ANAEROBIC Blood Culture adequate volume   Culture   Final    NO GROWTH 3 DAYS Performed at [facility], [address] [place], [place] [address]    Report Status PENDING  Incomplete  Culture, blood (Routine X 2) w Reflex to ID Panel     Status: None (Preliminary result)   Collection Time: 02/16/18  5:50 AM  Result Value Ref Range Status   Specimen Description BLOOD RIGHT ARM  Final   Special Requests   Final    BOTTLES DRAWN AEROBIC AND ANAEROBIC Blood Culture adequate volume   Culture   Final    NO GROWTH 3 DAYS Performed at [facility], [address]  [place], [place] [address]    Report Status PENDING  Incomplete     Labs: Basic Metabolic Panel: Recent Labs  Lab 02/13/18 1801 02/16/18 0232 02/16/18 1158 02/17/18 0205 02/18/18 0314 02/19/18 0806  NA 136 138  --  139 137 139  K 3.1* 3.4*  --  3.4* 3.9 3.4*  CL 98 101  --  112* 114* 119*  CO2 23 24  --  20* 18* 19*  GLUCOSE 99 106*  --  92 119* 78  BUN 17 12  --  11 6* <5*  CREATININE 0.79 0.71  --  0.59 0.49 0.42*  CALCIUM 9.2 9.0  --  7.7* 7.3* 7.6*  MG  --   --  1.3*  --   --  1.6*   Liver Function Tests: Recent Labs  Lab 02/13/18 1801 02/16/18 0459 02/17/18 0205 02/18/18 0314  AST 28 28 24  32  ALT 19 19 18 23   ALKPHOS 60 56 48 51  BILITOT 1.5* 1.3* 0.9 0.8  PROT 6.6 5.7* 4.7* 4.6*  ALBUMIN 2.7* 2.4* 2.1* 1.9*   Recent Labs  Lab 02/13/18 1801 02/16/18 0459  LIPASE 40 44   Recent Labs  Lab 02/16/18 0951  AMMONIA 23   CBC: Recent Labs  Lab 02/13/18 1801 02/16/18 0232 02/17/18 0205 02/19/18 0806  WBC 6.9 4.3 5.4 3.7*  HGB 12.1 10.9* 8.4* 7.3*  HCT 34.9* 32.2* 25.2*  23.7* 21.0*  MCV 98.3 98.2 98.1 96.8  PLT 188 169 122* 62*   Cardiac Enzymes: No results for input(s): CKTOTAL, CKMB, CKMBINDEX, TROPONINI in the last 168 hours. BNP: BNP (last 3 results) No results for input(s): BNP in the last 8760 hours.  ProBNP (last 3 results) No results for input(s): PROBNP in the last 8760 hours.  CBG: Recent Labs  Lab 02/16/18 0212  GLUCAP 80       Signed:  Geradine Girt DO.  [facility] 02/19/2018, 2:08 PM

## 2018-02-20 DIAGNOSIS — I959 Hypotension, unspecified: Secondary | ICD-10-CM

## 2018-02-20 LAB — CBC
HEMATOCRIT: 38.1 % (ref 36.0–46.0)
Hemoglobin: 13 g/dL (ref 12.0–15.0)
MCH: 31 pg (ref 26.0–34.0)
MCHC: 34.1 g/dL (ref 30.0–36.0)
MCV: 90.9 fL (ref 80.0–100.0)
Platelets: 56 10*3/uL — ABNORMAL LOW (ref 150–400)
RBC: 4.19 MIL/uL (ref 3.87–5.11)
RDW: 14 % (ref 11.5–15.5)
WBC: 5 10*3/uL (ref 4.0–10.5)
nRBC: 0 % (ref 0.0–0.2)

## 2018-02-20 LAB — BASIC METABOLIC PANEL
Anion gap: 6 (ref 5–15)
BUN: <5 mg/dL — ABNORMAL LOW (ref 8–23)
CO2: 16 mmol/L — AB (ref 22–32)
Calcium: 8.2 mg/dL — ABNORMAL LOW (ref 8.9–10.3)
Chloride: 116 mmol/L — ABNORMAL HIGH (ref 98–111)
Creatinine, Ser: 0.54 mg/dL (ref 0.44–1.00)
GFR calc Af Amer: >60 mL/min (ref >60)
GLUCOSE: 76 mg/dL (ref 70–99)
POTASSIUM: 3.2 mmol/L — AB (ref 3.5–5.1)
Sodium: 138 mmol/L (ref 135–145)

## 2018-02-20 LAB — BPAM RBC
Blood Product Expiration Date: 201911182359
Blood Product Expiration Date: 201911182359
ISSUE DATE / TIME: 201910181642
ISSUE DATE / TIME: 201910182015
UNIT TYPE AND RH: 5100
Unit Type and Rh: 5100

## 2018-02-20 LAB — HAPTOGLOBIN: Haptoglobin: 53 mg/dL (ref 34–200)

## 2018-02-20 LAB — TYPE AND SCREEN
ABO/RH(D): O POS
ANTIBODY SCREEN: NEGATIVE
UNIT DIVISION: 0
UNIT DIVISION: 0

## 2018-02-20 MED ORDER — WHITE PETROLATUM EX OINT
TOPICAL_OINTMENT | CUTANEOUS | Status: AC
  Administered 2018-02-20: 21:00:00
  Filled 2018-02-20: qty 28.35

## 2018-02-20 MED ORDER — GERHARDT'S BUTT CREAM
TOPICAL_CREAM | Freq: Four times a day (QID) | CUTANEOUS | Status: DC
  Administered 2018-02-20 – 2018-02-27 (×25): via TOPICAL
  Filled 2018-02-20 (×2): qty 1

## 2018-02-20 MED ORDER — DIPHENOXYLATE-ATROPINE 2.5-0.025 MG PO TABS
1.0000 | ORAL_TABLET | Freq: Four times a day (QID) | ORAL | Status: DC | PRN
  Administered 2018-02-20 – 2018-02-21 (×3): 1 via ORAL
  Filled 2018-02-20 (×3): qty 1

## 2018-02-20 NOTE — Progress Notes (Signed)
Pt noted with low BP 85/69, pt asymptomatic. On call K.Sofia notified via text page with no new order.

## 2018-02-20 NOTE — Progress Notes (Addendum)
PROGRESS NOTE    Pauleen E [place]  TDD:[id number] DOB: 06-17-1950 DOA: 02/16/2018 PCP: [name], MD    Brief Narrative:  67 year old female who presented with weight loss, abdominal pain, anorexia malaise weakness and vomiting.  Does have significant past medical history for type 2 diabetes mellitus and hypertension.  Reported ongoing symptoms for last 6 months, she was diagnosed with benign neutropenia as an outpatient.  Frequent ER visits due to weakness and dehydration, but this time her symptoms were aggravated by periumbilical abdominal pain.  On her initial physical examination her heart rate was 117, respiratory rate 20, blood pressure 103/81.  Mild icterus, lungs were clear to auscultation bilaterally, heart S1-S2 present and rhythmic, abdomen soft nontender nondistended no lower extremity edema.  Patient was admitted to the hospital with working diagnosis of weight loss, weakness, malaise, dehydration complicated by symptomatic anemia.   Assessment & Plan:   Principal Problem:   Malnutrition of moderate degree Active Problems:   Weakness   Nausea vomiting and diarrhea   Diabetes ([facility])   Essential hypertension   Pancytopenia ([facility])   Hypotension  1. Symptomatic anemia. Patient with improved hb and hct up to 13.0/ 38.1  but continue to have episodic hypotension, systolic 89 to 96 mmHg. Will continue gentle hydration, with isotonic saline at 75 ml per hour will follow on cell count in am. No clinical signs of bleeding.   2. Pancytopenia. Wbc at 5,0 with hb ay 13 (post prbc transfusion). Platelets, at 56 from 65.   3. Depression. Continue mirtazapine,    4. Calorie protein malnutrition. Will continue nutritional supplements.   DVT prophylaxis: enoxaparin   Code Status:  Full  Family Communication:  No family at the bedside  Disposition Plan/ discharge barriers: pending physical therapy evaluation.    Consultants:     Procedures:     Antimicrobials:        Subjective: Patient continue to be very weak and deconditioned, no nausea or vomiting, no chest pain or dyspnea. Tolerated well 2 units prbc transfusion yesterday.   Objective: Vitals:   02/19/18 2030 02/19/18 2101 02/19/18 2201 02/20/18 0457  BP: 113/81 113/81 137/77 (!) 89/67  Pulse: 81 88 89 (!) 101  Resp: 18 18 18 18   Temp: (!) 97.4 F (36.3 C) 97.8 F (36.6 C) (!) 97.4 F (36.3 C)   TempSrc: Oral Oral Oral   SpO2: 98%  97% 99%  Weight:      Height:        Intake/Output Summary (Last 24 hours) at 02/20/2018 1027 Last data filed at 02/20/2018 1021 Gross per 24 hour  Intake 2176.27 ml  Output -  Net 2176.27 ml   Filed Weights   02/16/18 0648  Weight: 48.7 kg    Examination:   General: deconditioned and ill looking appearing  Neurology: Awake and alert, non focal  E ENT: positive pallor, no icterus, oral mucosa moist Cardiovascular: No JVD. S1-S2 present, rhythmic, no gallops, rubs, or murmurs. No lower extremity edema. Pulmonary: positive breath sounds bilaterally, poor inspiratory effort, no wheezing, rhonchi or rales. Gastrointestinal. Abdomen with no organomegaly, non tender, no rebound or guarding Skin. No rashes Musculoskeletal: no joint deformities     Data Reviewed: I have personally reviewed following labs and imaging studies  CBC: Recent Labs  Lab 02/13/18 1801 02/16/18 0232 02/17/18 0205 02/19/18 0806 02/19/18 1447 02/20/18 0237  WBC 6.9 4.3 5.4 3.7*  --  5.0  HGB 12.1 10.9* 8.4* 7.3* 6.8* 13.0  HCT 34.9* 32.2* 25.2*  23.7* 21.0* 20.0* 38.1  MCV 98.3 98.2 98.1 96.8  --  90.9  PLT 188 169 122* 62*  --  56*   Basic Metabolic Panel: Recent Labs  Lab 02/16/18 0232 02/16/18 1158 02/17/18 0205 02/18/18 0314 02/19/18 0806 02/20/18 0237  NA 138  --  139 137 139 138  K 3.4*  --  3.4* 3.9 3.4* 3.2*  CL 101  --  112* 114* 119* 116*  CO2 24  --  20* 18* 19* 16*  GLUCOSE 106*  --  92 119* 78 76  BUN 12  --  11 6* <5* <5*  CREATININE  0.71  --  0.59 0.49 0.42* 0.54  CALCIUM 9.0  --  7.7* 7.3* 7.6* 8.2*  MG  --  1.3*  --   --  1.6*  --    GFR: Estimated Creatinine Clearance: 51.7 mL/min (by C-G formula based on SCr of 0.54 mg/dL). Liver Function Tests: Recent Labs  Lab 02/13/18 1801 02/16/18 0459 02/17/18 0205 02/18/18 0314  AST 28 28 24  32  ALT 19 19 18 23   ALKPHOS 60 56 48 51  BILITOT 1.5* 1.3* 0.9 0.8  PROT 6.6 5.7* 4.7* 4.6*  ALBUMIN 2.7* 2.4* 2.1* 1.9*   Recent Labs  Lab 02/13/18 1801 02/16/18 0459  LIPASE 40 44   Recent Labs  Lab 02/16/18 0951  AMMONIA 23   Coagulation Profile: Recent Labs  Lab 02/16/18 0951  INR 1.25   Cardiac Enzymes: No results for input(s): CKTOTAL, CKMB, CKMBINDEX, TROPONINI in the last 168 hours. BNP (last 3 results) No results for input(s): PROBNP in the last 8760 hours. HbA1C: No results for input(s): HGBA1C in the last 72 hours. CBG: Recent Labs  Lab 02/16/18 0212  GLUCAP 80   Lipid Profile: No results for input(s): CHOL, HDL, LDLCALC, TRIG, CHOLHDL, LDLDIRECT in the last 72 hours. Thyroid Function Tests: No results for input(s): TSH, T4TOTAL, FREET4, T3FREE, THYROIDAB in the last 72 hours. Anemia Panel: Recent Labs    02/19/18 1447  RETICCTPCT 0.2*      Radiology Studies: I have reviewed all of the imaging during this hospital visit personally     Scheduled Meds: . feeding supplement (ENSURE ENLIVE)  237 mL Oral TID BM  . lipase/protease/amylase  24,000 Units Oral TID WC  . mouth rinse  15 mL Mouth Rinse BID  . mirtazapine  7.5 mg Oral QHS  . multivitamin with minerals  1 tablet Oral Daily  . pantoprazole  40 mg Oral Daily  . pravastatin  40 mg Oral QPM   Continuous Infusions: . sodium chloride 75 mL/hr at 02/20/18 0400     LOS: 1 day        Mauricio Gerome Apley, MD [facility] Pager [phone]

## 2018-02-20 NOTE — Plan of Care (Signed)
  Problem: Pain Managment: Goal: General experience of comfort will improve Outcome: Progressing   Problem: Safety: Goal: Ability to remain free from injury will improve Outcome: Progressing   

## 2018-02-21 LAB — CULTURE, BLOOD (ROUTINE X 2)
CULTURE: NO GROWTH
Culture: NO GROWTH
Special Requests: ADEQUATE
Special Requests: ADEQUATE

## 2018-02-21 MED ORDER — VITAMIN B-1 100 MG PO TABS
100.0000 mg | ORAL_TABLET | Freq: Every day | ORAL | Status: DC
  Administered 2018-02-21 – 2018-02-27 (×5): 100 mg via ORAL
  Filled 2018-02-21 (×5): qty 1

## 2018-02-21 NOTE — Progress Notes (Signed)
PT Progress Note  Order received for PT eval. Pt currently on PT caseload with last Rx 02/18/18. PT recommending SNF. Per chart, discharge plan is still for SNF. PT to continue per current POC.  Lorrin Goodell, PT  Office # [phone] Pager [phone]

## 2018-02-21 NOTE — Progress Notes (Addendum)
PROGRESS NOTE    Maria Avila  KWI:[id number] DOB: 1950-07-27 DOA: 02/16/2018 PCP: [name], MD    Brief Narrative:  67 year old female who presented with weight loss, abdominal pain, anorexia malaise weakness and vomiting.  Does have significant past medical history for type 2 diabetes mellitus and hypertension.  Reported ongoing symptoms for last 6 months, she was diagnosed with benign neutropenia as an outpatient.  Frequent ER visits due to weakness and dehydration, but this time her symptoms were aggravated by periumbilical abdominal pain.  On her initial physical examination her heart rate was 117, respiratory rate 20, blood pressure 103/81.  Mild icterus, lungs were clear to auscultation bilaterally, heart S1-S2 present and rhythmic, abdomen soft nontender nondistended no lower extremity edema.  Patient was admitted to the hospital with working diagnosis of weight loss, weakness, malaise, dehydration complicated by symptomatic anemia.   Assessment & Plan:   Principal Problem:   Malnutrition of moderate degree Active Problems:   Weakness   Nausea vomiting and diarrhea   Diabetes (HCC)   Essential hypertension   Pancytopenia (HCC)   Hypotension  1. Symptomatic anemia. Improved blood pressure, this am 242 systolic. Will follow on Hb and Hct in am. Patient continue to be very weak and deconditioned, will check cell count in am. Persistent diarrhea added lomotil. Continue supportive IV fluids. Check Iron panel in am.   2. Pancytopenia. Check cell count in am, patient very deconditioned.  3. Depression. On mirtazapine, patient poorly responsive. Will add thiamine.   4. Calorie protein malnutrition. On nutritional supplements.   5. Persistent diarrhea. Will continue lomotil, skin care with topical skin care.  6. Stage 2 gluteal ulcers. Continue local wound care, air mattress and frequent turning. Gerhart's Butt Cream.     DVT prophylaxis: enoxaparin   Code Status:   Full  Family Communication:  I spoke with patient's son at the bedside and all questions were addressed.   Disposition Plan/ discharge barriers: pending SNF placement.    Consultants:     Procedures:     Antimicrobials:    Subjective: Patient continue to have significant diarrhea and deconditioning, poor oral intake, most information from her son at the bedside, limited history from patient due to deconditioning.   Objective: Vitals:   02/20/18 0457 02/20/18 1459 02/20/18 2113 02/21/18 0434  BP: (!) 89/67 96/78 (!) 85/69 140/82  Pulse: (!) 101 (!) 120 79 88  Resp: 18 17 16 16   Temp:    97.7 F (36.5 C)  TempSrc:    Axillary  SpO2: 99% 100% 100% 100%  Weight:      Height:        Intake/Output Summary (Last 24 hours) at 02/21/2018 1158 Last data filed at 02/21/2018 0436 Gross per 24 hour  Intake 1992.83 ml  Output -  Net 1992.83 ml   Filed Weights   02/16/18 0648  Weight: 48.7 kg    Examination:   General: deconditioned  Neurology: Somnolent, non focal  E ENT: mild pallor, no icterus, oral mucosa moist Cardiovascular: No JVD. S1-S2 present, rhythmic, no gallops, rubs, or murmurs. No lower extremity edema. Pulmonary: positive breath sounds bilaterally, adequate air movement, no wheezing, rhonchi or rales. Gastrointestinal. Abdomen with distention, no organomegaly, non tender, no rebound or guarding Skin. Erythema at the gluteal regions.  Musculoskeletal: no joint deformities     Data Reviewed: I have personally reviewed following labs and imaging studies  CBC: Recent Labs  Lab 02/16/18 0232 02/17/18 0205 02/19/18 0806 02/19/18 1447 02/20/18  0237  WBC 4.3 5.4 3.7*  --  5.0  HGB 10.9* 8.4* 7.3* 6.8* 13.0  HCT 32.2* 25.2*  23.7* 21.0* 20.0* 38.1  MCV 98.2 98.1 96.8  --  90.9  PLT 169 122* 62*  --  56*   Basic Metabolic Panel: Recent Labs  Lab 02/16/18 0232 02/16/18 1158 02/17/18 0205 02/18/18 0314 02/19/18 0806 02/20/18 0237  NA  138  --  139 137 139 138  K 3.4*  --  3.4* 3.9 3.4* 3.2*  CL 101  --  112* 114* 119* 116*  CO2 24  --  20* 18* 19* 16*  GLUCOSE 106*  --  92 119* 78 76  BUN 12  --  11 6* <5* <5*  CREATININE 0.71  --  0.59 0.49 0.42* 0.54  CALCIUM 9.0  --  7.7* 7.3* 7.6* 8.2*  MG  --  1.3*  --   --  1.6*  --    GFR: Estimated Creatinine Clearance: 51.7 mL/min (by C-G formula based on SCr of 0.54 mg/dL). Liver Function Tests: Recent Labs  Lab 02/16/18 0459 02/17/18 0205 02/18/18 0314  AST 28 24 32  ALT 19 18 23   ALKPHOS 56 48 51  BILITOT 1.3* 0.9 0.8  PROT 5.7* 4.7* 4.6*  ALBUMIN 2.4* 2.1* 1.9*   Recent Labs  Lab 02/16/18 0459  LIPASE 44   Recent Labs  Lab 02/16/18 0951  AMMONIA 23   Coagulation Profile: Recent Labs  Lab 02/16/18 0951  INR 1.25   Cardiac Enzymes: No results for input(s): CKTOTAL, CKMB, CKMBINDEX, TROPONINI in the last 168 hours. BNP (last 3 results) No results for input(s): PROBNP in the last 8760 hours. HbA1C: No results for input(s): HGBA1C in the last 72 hours. CBG: Recent Labs  Lab 02/16/18 0212  GLUCAP 80   Lipid Profile: No results for input(s): CHOL, HDL, LDLCALC, TRIG, CHOLHDL, LDLDIRECT in the last 72 hours. Thyroid Function Tests: No results for input(s): TSH, T4TOTAL, FREET4, T3FREE, THYROIDAB in the last 72 hours. Anemia Panel: Recent Labs    02/19/18 1447  RETICCTPCT 0.2*      Radiology Studies: I have reviewed all of the imaging during this hospital visit personally     Scheduled Meds: . feeding supplement (ENSURE ENLIVE)  237 mL Oral TID BM  . Gerhardt's butt cream   Topical QID  . lipase/protease/amylase  24,000 Units Oral TID WC  . mouth rinse  15 mL Mouth Rinse BID  . mirtazapine  7.5 mg Oral QHS  . multivitamin with minerals  1 tablet Oral Daily  . pantoprazole  40 mg Oral Daily  . pravastatin  40 mg Oral QPM   Continuous Infusions: . sodium chloride 75 mL/hr at 02/21/18 0234     LOS: 2 days        Tawni Millers, MD [facility] Pager [phone]

## 2018-02-21 NOTE — Consult Note (Signed)
[facility] Nurse wound consult note Reason for Consult: Patient seen after interventions initiated yesterday following RN call regarding interventions for IAD (continence associated dermatitis) and ITD (intertriginous dermatitis).  Mattress replacement with low air loss feature and the initiation of a prescriptive skin care cream (Gerhart's Butt Cream) were initiated yesterday. Open areas are not pressure related. Wound type:Moisture Pressure Injury POA: N/A Measurement: Linear partial thickness skin loss at buttocks:  3cm x 0.2cm x 0.2cm with pink, moist wound bed and scant serous exudate Bilateral partial thickness skin loss in the lower buttock, perineal area measuring 3cm x 4cm x 0.1cm with scattered partial thickness skin loss noted. Wound bed:As described above Drainage (amount, consistency, odor) serous, scant to small amount Periwound: macerated Dressing procedure/placement/frequency: Mattress replacement is in place and Nursing staff instructed to turn from side to side and minimize time spent in the supine position and to place a pillow between patient's knees to promote airflow. House moisture barrier ointment has been replaced with prescriptive and compounded Gerhart's Butt Cream (trimcinalone/lotrimin/zinc oxide) with early improvement. Today an external urinary incontinence collection device (PurWick) has been implemented.   [facility] nursing team will not follow, but will remain available to this patient, the nursing and medical teams.  Please re-consult if needed. Thanks, Maudie Flakes, MSN, RN, [place], Arther Abbott  Pager# [phone]

## 2018-02-21 NOTE — Progress Notes (Signed)
   02/21/18 1100  Clinical Encounter Type  Visited With Patient and family together  Visit Type Follow-up;Social support  Referral From Other (Comment) (chaplain rounding)  Spiritual Encounters  Spiritual Needs Emotional  Stress Factors  Patient Stress Factors Loss of control;Exhausted   F/u from visit on Friday.  At that time, plan was to d/c to home that day; pt still here, now plan to d/c to rehab facility.  This visit met w/ pt and pt's third child (adult son) in room.  Compassionate presence.  Chaplain available for add'l support if desired.  Myra Gianotti resident, [phone]

## 2018-02-22 ENCOUNTER — Inpatient Hospital Stay (HOSPITAL_COMMUNITY): Payer: Medicare Other

## 2018-02-22 DIAGNOSIS — D649 Anemia, unspecified: Secondary | ICD-10-CM

## 2018-02-22 DIAGNOSIS — Z681 Body mass index (BMI) 19 or less, adult: Secondary | ICD-10-CM

## 2018-02-22 DIAGNOSIS — E0800 Diabetes mellitus due to underlying condition with hyperosmolarity without nonketotic hyperglycemic-hyperosmolar coma (NKHHC): Secondary | ICD-10-CM

## 2018-02-22 DIAGNOSIS — D696 Thrombocytopenia, unspecified: Secondary | ICD-10-CM

## 2018-02-22 LAB — FERRITIN: FERRITIN: 2206 ng/mL — AB (ref 11–307)

## 2018-02-22 LAB — CBC WITH DIFFERENTIAL/PLATELET
ABS IMMATURE GRANULOCYTES: 0.24 10*3/uL — AB (ref 0.00–0.07)
BASOS ABS: 0 10*3/uL (ref 0.0–0.1)
Basophils Relative: 0 %
Eosinophils Absolute: 0.1 10*3/uL (ref 0.0–0.5)
Eosinophils Relative: 1 %
HCT: 30.5 % — ABNORMAL LOW (ref 36.0–46.0)
HEMOGLOBIN: 10.5 g/dL — AB (ref 12.0–15.0)
IMMATURE GRANULOCYTES: 2 %
LYMPHS ABS: 2.9 10*3/uL (ref 0.7–4.0)
LYMPHS PCT: 21 %
MCH: 30.5 pg (ref 26.0–34.0)
MCHC: 34.4 g/dL (ref 30.0–36.0)
MCV: 88.7 fL (ref 80.0–100.0)
MONOS PCT: 6 %
Monocytes Absolute: 0.8 10*3/uL (ref 0.1–1.0)
NEUTROS ABS: 9.6 10*3/uL — AB (ref 1.7–7.7)
NRBC: 0 % (ref 0.0–0.2)
Neutrophils Relative %: 70 %
PLATELETS: 36 10*3/uL — AB (ref 150–400)
RBC: 3.44 MIL/uL — ABNORMAL LOW (ref 3.87–5.11)
RDW: 14.7 % (ref 11.5–15.5)
WBC Morphology: INCREASED
WBC: 13.7 10*3/uL — ABNORMAL HIGH (ref 4.0–10.5)

## 2018-02-22 LAB — SEDIMENTATION RATE: Sed Rate: 34 mm/hr — ABNORMAL HIGH (ref 0–22)

## 2018-02-22 LAB — BASIC METABOLIC PANEL
Anion gap: 4 — ABNORMAL LOW (ref 5–15)
BUN: <5 mg/dL — ABNORMAL LOW (ref 8–23)
CHLORIDE: 117 mmol/L — AB (ref 98–111)
CO2: 18 mmol/L — ABNORMAL LOW (ref 22–32)
Calcium: 8.5 mg/dL — ABNORMAL LOW (ref 8.9–10.3)
Creatinine, Ser: 0.58 mg/dL (ref 0.44–1.00)
Glucose, Bld: 65 mg/dL — ABNORMAL LOW (ref 70–99)
POTASSIUM: 2.8 mmol/L — AB (ref 3.5–5.1)
SODIUM: 139 mmol/L (ref 135–145)

## 2018-02-22 LAB — DIC (DISSEMINATED INTRAVASCULAR COAGULATION) PANEL
APTT: 41 s — AB (ref 24–36)
PROTHROMBIN TIME: 16 s — AB (ref 11.4–15.2)
SMEAR REVIEW: NONE SEEN

## 2018-02-22 LAB — DIC (DISSEMINATED INTRAVASCULAR COAGULATION)PANEL
D-Dimer, Quant: 1.39 ug/mL-FEU — ABNORMAL HIGH (ref 0.00–0.50)
Fibrinogen: 498 mg/dL — ABNORMAL HIGH (ref 210–475)
INR: 1.29
Platelets: 45 10*3/uL — ABNORMAL LOW (ref 150–400)

## 2018-02-22 LAB — CORTISOL: Cortisol, Plasma: 22.3 ug/dL

## 2018-02-22 LAB — IRON AND TIBC: IRON: 21 ug/dL — AB (ref 28–170)

## 2018-02-22 LAB — MAGNESIUM: Magnesium: 1.4 mg/dL — ABNORMAL LOW (ref 1.7–2.4)

## 2018-02-22 LAB — TRANSFERRIN: Transferrin: <70 mg/dL — ABNORMAL LOW (ref 192–382)

## 2018-02-22 LAB — TECHNOLOGIST SMEAR REVIEW

## 2018-02-22 MED ORDER — MAGNESIUM SULFATE 50 % IJ SOLN
3.0000 g | Freq: Once | INTRAVENOUS | Status: AC
  Administered 2018-02-22: 3 g via INTRAVENOUS
  Filled 2018-02-22: qty 6

## 2018-02-22 MED ORDER — SODIUM CHLORIDE 0.9 % IV SOLN
INTRAVENOUS | Status: DC
  Administered 2018-02-22 – 2018-02-27 (×9): via INTRAVENOUS
  Filled 2018-02-22 (×15): qty 1000

## 2018-02-22 NOTE — Clinical Social Work Note (Signed)
CSW reviewed MD's progress note and patient needs SNF, but is not stable for discharge. Patient will discharge to [facility] and they have received insurance authorization for patient to admit to their facility. Unit CSW will continue to monitor patient's progress and facilitate discharge to [facility] once medically stable.  Graeson Nouri Givens, MSW, LCSW Licensed Clinical Social Worker [place] [phone]

## 2018-02-22 NOTE — Progress Notes (Addendum)
PROGRESS NOTE    Maria Avila  OEV:[id number] DOB: 1950/09/13 DOA: 02/16/2018 PCP: [name], MD    Brief Narrative:  67 year old female who presented with weight loss, abdominal pain, anorexia malaise weakness and vomiting.  Does have significant past medical history for type 2 diabetes mellitus and hypertension.  Reported ongoing symptoms for last 6 months, she was diagnosed with benign neutropenia as an outpatient.  Frequent ER visits due to weakness and dehydration, but this time her symptoms were aggravated by periumbilical abdominal pain.  On her initial physical examination her heart rate was 117, respiratory rate 20, blood pressure 103/81.  Mild icterus, lungs were clear to auscultation bilaterally, heart S1-S2 present and rhythmic, abdomen soft nontender nondistended no lower extremity edema.  Patient was admitted to the hospital with working diagnosis of weight loss, weakness, malaise, dehydration complicated by symptomatic anemia. Now noted to have several electrolyte abnormalities,worsening thrombocytopenia, unexplained leukocytosis. Oncology and GI consulted,     Assessment & Plan:   Principal Problem:   Malnutrition of moderate degree Active Problems:   Weakness   Nausea vomiting and diarrhea   Diabetes (HCC)   Essential hypertension   Pancytopenia (HCC)   Hypotension  1.  Intermittent hypotension. Improved blood pressure, this am 829 systolic. Will follow on Hb and Hct in am. Patient continues to be very weak and deconditioned,  Continue to follow cell count in am. Persistent diarrhea  Improved with prn lomotil. Continue supportive IV fluids.  .   2. Pancytopenia. Noted to have sudden increase in white count , worsening pancytopenia, drop in hemoglobin from 12 >6.8, status post 2 units of packed red blood cells Drop in platelet count could be dilutional but not expected to be this dramatic after 2 units of blood Noted to have elevated ferritin likely acute phase  reactant, transfer and less than 70, normal B-12 and folate, oncology Dr. Alen Blew  consulted for further evaluation Will obtain chest x-ray, UA , to further evaluate leukocytosis  3. Depression. On mirtazapine, patient poorly responsive. Will add thiamine.   4. Calorie protein malnutrition. On nutritional supplements. replete magnesium, potassium  5. Persistent diarrhea. Will continue lomotil, skin care with topical skin care.will likely need inpatient evaluation with a colonoscopy, will notify  [facility] gastroenterology  6. Stage 2 gluteal ulcers. Continue local wound care, air mattress and frequent turning. Gerhart's Butt Cream.    7. Weight loss/weakness/hypotension  recently discharged between 10/15 -10/18, was evaluated by gastroenterology , thought to have protein calorie malnutrition with hypoalbuminemia, steatosis of the liver, chronic diarrhea patient would need repeat colonoscopy, negative HIDA , patient started on pancreatic enzyme supplements .given acutely worsening hemoglobin and guaiac positivity along with diarrhea but somewhat improved, requested Dr. Michail Sermon to see her and evaluate her for inpatient colo as she has already had 4 ED visits this month   8. Hypokalemia/hypomagnesemia-received 3 grams of magnesium sulfate   DVT prophylaxis: enoxaparin   Code Status:  Full  Family Communication:  I spoke with patient's son at the bedside and all questions were addressed.   Disposition Plan/ discharge barriers: pending SNF placement. But not stable for discharge   Consultants:   ONCOLOGY  GI  Procedures:     Antimicrobials:    Subjective: Patient continue to have significant diarrhea and deconditioning, poor oral intake, most information from her son at the bedside, limited history from patient due to deconditioning.   Objective: Vitals:   02/21/18 0434 02/21/18 1539 02/21/18 2015 02/22/18 0624  BP: 140/82 92/67  103/80 (!) 125/93  Pulse: 88 87 85 (!) 102    Resp: 16 15 16 16   Temp: 97.7 F (36.5 C) (!) 97.3 F (36.3 C) (!) 97.4 F (36.3 C) (!) 97.5 F (36.4 C)  TempSrc: Axillary Oral Oral Oral  SpO2: 100% 98% 100% 100%  Weight:      Height:        Intake/Output Summary (Last 24 hours) at 02/22/2018 1012 Last data filed at 02/22/2018 0920 Gross per 24 hour  Intake 1948.65 ml  Output 770 ml  Net 1178.65 ml   Filed Weights   02/16/18 0648  Weight: 48.7 kg    Examination:   General: deconditioned  Neurology: Somnolent, non focal  E ENT: mild pallor, no icterus, oral mucosa moist Cardiovascular: No JVD. S1-S2 present, rhythmic, no gallops, rubs, or murmurs. No lower extremity edema. Pulmonary: positive breath sounds bilaterally, adequate air movement, no wheezing, rhonchi or rales. Gastrointestinal. Abdomen with distention, no organomegaly, non tender, no rebound or guarding Skin. Erythema at the gluteal regions.  Musculoskeletal: no joint deformities     Data Reviewed: I have personally reviewed following labs and imaging studies  CBC: Recent Labs  Lab 02/16/18 0232 02/17/18 0205 02/19/18 0806 02/19/18 1447 02/20/18 0237 02/22/18 0311  WBC 4.3 5.4 3.7*  --  5.0 13.7*  NEUTROABS  --   --   --   --   --  9.6*  HGB 10.9* 8.4* 7.3* 6.8* 13.0 10.5*  HCT 32.2* 25.2*  23.7* 21.0* 20.0* 38.1 30.5*  MCV 98.2 98.1 96.8  --  90.9 88.7  PLT 169 122* 62*  --  56* 36*   Basic Metabolic Panel: Recent Labs  Lab 02/16/18 1158 02/17/18 0205 02/18/18 0314 02/19/18 0806 02/20/18 0237 02/22/18 0311  NA  --  139 137 139 138 139  K  --  3.4* 3.9 3.4* 3.2* 2.8*  CL  --  112* 114* 119* 116* 117*  CO2  --  20* 18* 19* 16* 18*  GLUCOSE  --  92 119* 78 76 65*  BUN  --  11 6* <5* <5* <5*  CREATININE  --  0.59 0.49 0.42* 0.54 0.58  CALCIUM  --  7.7* 7.3* 7.6* 8.2* 8.5*  MG 1.3*  --   --  1.6*  --   --    GFR: Estimated Creatinine Clearance: 51.7 mL/min (by C-G formula based on SCr of 0.58 mg/dL). Liver Function  Tests: Recent Labs  Lab 02/16/18 0459 02/17/18 0205 02/18/18 0314  AST 28 24 32  ALT 19 18 23   ALKPHOS 56 48 51  BILITOT 1.3* 0.9 0.8  PROT 5.7* 4.7* 4.6*  ALBUMIN 2.4* 2.1* 1.9*   Recent Labs  Lab 02/16/18 0459  LIPASE 44   Recent Labs  Lab 02/16/18 0951  AMMONIA 23   Coagulation Profile: Recent Labs  Lab 02/16/18 0951  INR 1.25   Cardiac Enzymes: No results for input(s): CKTOTAL, CKMB, CKMBINDEX, TROPONINI in the last 168 hours. BNP (last 3 results) No results for input(s): PROBNP in the last 8760 hours. HbA1C: No results for input(s): HGBA1C in the last 72 hours. CBG: Recent Labs  Lab 02/16/18 0212  GLUCAP 80   Lipid Profile: No results for input(s): CHOL, HDL, LDLCALC, TRIG, CHOLHDL, LDLDIRECT in the last 72 hours. Thyroid Function Tests: No results for input(s): TSH, T4TOTAL, FREET4, T3FREE, THYROIDAB in the last 72 hours. Anemia Panel: Recent Labs    02/19/18 1447 02/22/18 0311  FERRITIN  --  2,206*  TIBC  --  NOT CALCULATED  IRON  --  21*  RETICCTPCT 0.2*  --       Radiology Studies: I have reviewed all of the imaging during this hospital visit personally     Scheduled Meds: . feeding supplement (ENSURE ENLIVE)  237 mL Oral TID BM  . Gerhardt's butt cream   Topical QID  . lipase/protease/amylase  24,000 Units Oral TID WC  . mouth rinse  15 mL Mouth Rinse BID  . mirtazapine  7.5 mg Oral QHS  . multivitamin with minerals  1 tablet Oral Daily  . pantoprazole  40 mg Oral Daily  . pravastatin  40 mg Oral QPM  . thiamine  100 mg Oral Daily   Continuous Infusions: . magnesium sulfate 1 - 4 g bolus IVPB    . 0.9 % sodium chloride with kcl       LOS: 3 days        Reyne Dumas, MD [facility]

## 2018-02-22 NOTE — Consult Note (Signed)
Referring Provider: Dr. Allyson Sabal Primary Care Physician:  [name], MD Primary Gastroenterologist:  Dr. Therisa Doyne  Reason for Consultation:  Diarrhea; Anemia  HPI: Maria Avila is a 67 y.o. female with chronic N/V/weight loss and nonbloody diarrhea. CT angiogram in early October showed diffuse colonic wall thickening that was non-specific. Previous EGD, CT scans, and U/S have otherwise been unrevealing for a source of her symptoms. Hgb dropped to 6.8 on 02/19/18 and now it is 10.5. Thrombocytopenia with platelets of 36 this morning and 45 at 10AM. INR 1.29. Has a history of colon cancer in her father. Electrolyte abnormalities with K 2.8, Mg 1.4. Hematology consult reviewed. Husband at bedside.  Past Medical History:  Diagnosis Date  . Colitis   . Diabetes mellitus without complication ([place])   . Hypertension   . Malnutrition ([place])     Past Surgical History:  Procedure Laterality Date  . NO PAST SURGERIES     UNCERTAIN OF NAME OF SURGERY    Prior to Admission medications   Medication Sig Start Date End Date Taking? Authorizing Provider  aspirin EC 81 MG tablet Take 81 mg by mouth daily. 07/21/17  Yes [provider]  Cholecalciferol (VITAMIN D) 2000 units CAPS Take 1 capsule by mouth daily. 07/21/17  Yes [provider]  lisinopril (PRINIVIL,ZESTRIL) 10 MG tablet Take 10 mg by mouth daily. 07/16/17  Yes [provider]  Multiple Vitamin (MULTIVITAMIN) capsule Take 1 capsule by mouth daily. 07/21/17  Yes [provider]  naproxen sodium (ALEVE) 220 MG tablet Take 220 mg by mouth 2 (two) times daily as needed. 07/21/17  Yes [provider]  Omega-3 Fatty Acids (FISH OIL) 1000 MG CAPS Take 1 capsule by mouth daily. 07/21/17  Yes [provider]  ondansetron (ZOFRAN ODT) 4 MG disintegrating tablet Take 1 tablet (4 mg total) by mouth every 6 (six) hours as needed. 02/01/18  Yes Ward, Kristen N, DO  pantoprazole (PROTONIX) 40 MG tablet Take 40 mg  by mouth daily. 07/21/17  Yes [provider]  pravastatin (PRAVACHOL) 40 MG tablet Take 40 mg by mouth every evening. 12/20/17  Yes [provider]  promethazine (PHENERGAN) 12.5 MG tablet Take 12.5 mg by mouth every 6 (six) hours as needed for nausea. 02/10/18  Yes [provider]  lipase/protease/amylase 24000-76000 units CPEP Take 1 capsule (24,000 Units total) by mouth 3 (three) times daily with meals. 02/18/18   Black, Lezlie Octave, NP  mirtazapine (REMERON) 7.5 MG tablet Take 1 tablet (7.5 mg total) by mouth at bedtime. 02/18/18   Black, Lezlie Octave, NP    Scheduled Meds: . feeding supplement (ENSURE ENLIVE)  237 mL Oral TID BM  . Gerhardt's butt cream   Topical QID  . lipase/protease/amylase  24,000 Units Oral TID WC  . mouth rinse  15 mL Mouth Rinse BID  . mirtazapine  7.5 mg Oral QHS  . multivitamin with minerals  1 tablet Oral Daily  . pantoprazole  40 mg Oral Daily  . pravastatin  40 mg Oral QPM  . thiamine  100 mg Oral Daily   Continuous Infusions: . magnesium sulfate 1 - 4 g bolus IVPB 3 g (02/22/18 1330)  . 0.9 % sodium chloride with kcl 75 mL/hr at 02/22/18 1221   PRN Meds:.diphenoxylate-atropine, ondansetron **OR** ondansetron (ZOFRAN) IV, traMADol  Allergies as of 02/16/2018 - Review Complete 02/16/2018  Allergen Reaction Noted  . Crestor [rosuvastatin calcium] Swelling and Other (See Comments) 07/21/2017  . Zocor [simvastatin] Swelling and Other (  See Comments) 07/21/2017    Family History  Problem Relation Age of Onset  . Alzheimer's disease Mother   . Colon cancer Father     Social History   Socioeconomic History  . Marital status: Married    Spouse name: Not on file  . Number of children: Not on file  . Years of education: Not on file  . Highest education level: Not on file  Occupational History  . Not on file  Social Needs  . Financial resource strain: Not on file  . Food insecurity:    Worry: Not on file    Inability: Not on file   . Transportation needs:    Medical: Not on file    Non-medical: Not on file  Tobacco Use  . Smoking status: Former Research scientist (life sciences)  . Smokeless tobacco: Never Used  Substance and Sexual Activity  . Alcohol use: Not Currently  . Drug use: Not Currently  . Sexual activity: Not on file  Lifestyle  . Physical activity:    Days per week: Not on file    Minutes per session: Not on file  . Stress: Not on file  Relationships  . Social connections:    Talks on phone: Not on file    Gets together: Not on file    Attends religious service: Not on file    Active member of club or organization: Not on file    Attends meetings of clubs or organizations: Not on file    Relationship status: Not on file  . Intimate partner violence:    Fear of current or ex partner: Not on file    Emotionally abused: Not on file    Physically abused: Not on file    Forced sexual activity: Not on file  Other Topics Concern  . Not on file  Social History Narrative  . Not on file    Review of Systems: All negative except as stated above in HPI.  Physical Exam: Vital signs: Vitals:   02/21/18 2015 02/22/18 0624  BP: 103/80 (!) 125/93  Pulse: 85 (!) 102  Resp: 16 16  Temp: (!) 97.4 F (36.3 C) (!) 97.5 F (36.4 C)  SpO2: 100% 100%   Last BM Date: 02/21/18 General:   Lethargic, elderly, thin, no acute distress Head: normocephalic, atraumatic Eyes: anicteric sclera ENT: oropharynx clear Neck: supple, nontender Lungs:  Clear throughout to auscultation.   No wheezes, crackles, or rhonchi. No acute distress. Heart:  Regular rate and rhythm; no murmurs, clicks, rubs,  or gallops. Abdomen: periumbilical tenderness with guarding, otherwise nontender, soft, nondistended, +BS  Rectal:  Deferred Ext: no edema  GI:  Lab Results: Recent Labs    02/19/18 1447 02/20/18 0237 02/22/18 0311 02/22/18 1043  WBC  --  5.0 13.7*  --   HGB 6.8* 13.0 10.5*  --   HCT 20.0* 38.1 30.5*  --   PLT  --  56* 36* 45*    BMET Recent Labs    02/20/18 0237 02/22/18 0311  NA 138 139  K 3.2* 2.8*  CL 116* 117*  CO2 16* 18*  GLUCOSE 76 65*  BUN <5* <5*  CREATININE 0.54 0.58  CALCIUM 8.2* 8.5*   LFT No results for input(s): PROT, ALBUMIN, AST, ALT, ALKPHOS, BILITOT, BILIDIR, IBILI in the last 72 hours. PT/INR Recent Labs    02/22/18 1043  LABPROT 16.0*  INR 1.29     Studies/Results: No results found.  Impression/Plan: Pancytopenia with chronic diarrhea and anemia in need of  a colonoscopy as an inpt. Needs her electrolytes corrected and will plan to do the colonoscopy later this week if platelet count at least 50K but can do if at least 20K with biopsies only. Supportive care otherwise. Will f/u.    LOS: 3 days   [name]  02/22/2018, 1:32 PM  Questions please call [phone]

## 2018-02-22 NOTE — Plan of Care (Signed)
  Problem: Education: Goal: Knowledge of General Education information will improve Description Including pain rating scale, medication(s)/side effects and non-pharmacologic comfort measures Outcome: Progressing   Problem: Health Behavior/Discharge Planning: Goal: Ability to manage health-related needs will improve Outcome: Progressing   Problem: Clinical Measurements: Goal: Ability to maintain clinical measurements within normal limits will improve Outcome: Progressing Goal: Will remain free from infection Outcome: Progressing Goal: Diagnostic test results will improve Outcome: Progressing Goal: Respiratory complications will improve Outcome: Progressing Goal: Cardiovascular complication will be avoided Outcome: Progressing   Problem: Activity: Goal: Risk for activity intolerance will decrease Outcome: Progressing   Problem: Nutrition: Goal: Adequate nutrition will be maintained Outcome: Progressing   Problem: Coping: Goal: Level of anxiety will decrease Outcome: Progressing   Problem: Pain Managment: Goal: General experience of comfort will improve Outcome: Progressing   Problem: Safety: Goal: Ability to remain free from injury will improve Outcome: Progressing   Problem: Skin Integrity: Goal: Risk for impaired skin integrity will decrease Outcome: Progressing  Forestine Chute, RN 02/22/2018

## 2018-02-22 NOTE — Care Management Important Message (Signed)
Important Message  Patient Details  Name: Maria Avila MRN: [id number] Date of Birth: 07-03-1950   Medicare Important Message Given:  Yes    Natalie Mceuen Montine Circle 02/22/2018, 3:52 PM

## 2018-02-22 NOTE — Consult Note (Signed)
Reason for the request:    Thrombocytopenia  HPI: 67 year old woman who we have seen in the past back in April 2019 with evaluation of lymphocytosis and weight loss.  Her evaluation at that time did not reveal any evidence of a malignant disorder with her peripheral blood flow cytometry showed no abnormalities.  Imaging studies of the abdomen and pelvis did not show any splenomegaly or lymphadenopathy.  She was hospitalized acutely on 02/16/2018 with the failure to thrive, malaise and weakness.  During her hospital stay her platelet count on admission was normal and declined precipitously from 188 on October 12, 169 on October 15 236 on October 21.  Her hemoglobin was as low as 6.8 on October 18 and received 2 units of packed red cell transfusion and currently hemoglobin at 10.5.  Her white cell count was 13.7.  The differential was normal otherwise.  No active bleeding is noted.  She has no major complaints at this time.  He still generally weak and quite debilitated.   She does not report any headaches, blurry vision, syncope or seizures. Does not report any fevers, chills or sweats.  Does not report any cough, wheezing or hemoptysis.  Does not report any chest pain, palpitation, orthopnea or leg edema.  Does not report any nausea, vomiting or abdominal pain.  Does not report any constipation or diarrhea.  Does not report any skeletal complaints.    Does not report frequency, urgency or hematuria.  Does not report any skin rashes or lesions. Does not report any heat or cold intolerance.  Does not report any lymphadenopathy or petechiae.  Does not report any anxiety or depression.  Remaining review of systems is negative.    Past Medical History:  Diagnosis Date  . Colitis   . Diabetes mellitus without complication ([place])   . Hypertension   . Malnutrition ([place])   :  Past Surgical History:  Procedure Laterality Date  . NO PAST SURGERIES     UNCERTAIN OF NAME OF SURGERY  :   Current  Facility-Administered Medications:  .  diphenoxylate-atropine (LOMOTIL) 2.5-0.025 MG per tablet 1 tablet, 1 tablet, Oral, QID PRN, Arrien, Jimmy Picket, MD, 1 tablet at 02/21/18 1354 .  feeding supplement (ENSURE ENLIVE) (ENSURE ENLIVE) liquid 237 mL, 237 mL, Oral, TID BM, Amin, Ankit Chirag, MD, 237 mL at 02/22/18 1056 .  Gerhardt's butt cream, , Topical, QID, Vann, Jessica U, DO .  lipase/protease/amylase (CREON) capsule 24,000 Units, 24,000 Units, Oral, TID WC, Arta Silence, MD, 24,000 Units at 02/22/18 1050 .  magnesium sulfate 3 g in dextrose 5 % 100 mL IVPB, 3 g, Intravenous, Once, Abrol, Nayana, MD .  MEDLINE mouth rinse, 15 mL, Mouth Rinse, BID, Danford, Suann Larry, MD, 15 mL at 02/21/18 2210 .  mirtazapine (REMERON) tablet 7.5 mg, 7.5 mg, Oral, QHS, Black, Karen M, NP, 7.5 mg at 02/21/18 2205 .  multivitamin with minerals tablet 1 tablet, 1 tablet, Oral, Daily, Amin, Ankit Chirag, MD, 1 tablet at 02/22/18 1051 .  ondansetron (ZOFRAN) tablet 4 mg, 4 mg, Oral, Q6H PRN **OR** ondansetron (ZOFRAN) injection 4 mg, 4 mg, Intravenous, Q6H PRN, Danford, Christopher P, MD .  pantoprazole (PROTONIX) EC tablet 40 mg, 40 mg, Oral, Daily, Danford, Suann Larry, MD, 40 mg at 02/22/18 1051 .  pravastatin (PRAVACHOL) tablet 40 mg, 40 mg, Oral, QPM, Danford, Suann Larry, MD, 40 mg at 02/21/18 1804 .  sodium chloride 0.9 % 1,000 mL with potassium chloride 60 mEq infusion, , Intravenous, Continuous, Abrol,  Ascencion Dike, MD .  thiamine (VITAMIN B-1) tablet 100 mg, 100 mg, Oral, Daily, Arrien, Jimmy Picket, MD, 100 mg at 02/22/18 1051 .  traMADol (ULTRAM) tablet 50 mg, 50 mg, Oral, Q6H PRN, Radene Gunning, NP, 50 mg at 02/21/18 2205:  Allergies  Allergen Reactions  . Crestor [Rosuvastatin Calcium] Swelling and Other (See Comments)    Swelling of legs and muscle weakness  . Zocor [Simvastatin] Swelling and Other (See Comments)    Swelling of legs and muscle weakness  :  Family History  Problem  Relation Age of Onset  . Alzheimer's disease Mother   . Colon cancer Father   :  Social History   Socioeconomic History  . Marital status: Married    Spouse name: Not on file  . Number of children: Not on file  . Years of education: Not on file  . Highest education level: Not on file  Occupational History  . Not on file  Social Needs  . Financial resource strain: Not on file  . Food insecurity:    Worry: Not on file    Inability: Not on file  . Transportation needs:    Medical: Not on file    Non-medical: Not on file  Tobacco Use  . Smoking status: Former Research scientist (life sciences)  . Smokeless tobacco: Never Used  Substance and Sexual Activity  . Alcohol use: Not Currently  . Drug use: Not Currently  . Sexual activity: Not on file  Lifestyle  . Physical activity:    Days per week: Not on file    Minutes per session: Not on file  . Stress: Not on file  Relationships  . Social connections:    Talks on phone: Not on file    Gets together: Not on file    Attends religious service: Not on file    Active member of club or organization: Not on file    Attends meetings of clubs or organizations: Not on file    Relationship status: Not on file  . Intimate partner violence:    Fear of current or ex partner: Not on file    Emotionally abused: Not on file    Physically abused: Not on file    Forced sexual activity: Not on file  Other Topics Concern  . Not on file  Social History Narrative  . Not on file  :  Pertinent items are noted in HPI.  Exam: Blood pressure (!) 125/93, pulse (!) 102, temperature (!) 97.5 F (36.4 C), temperature source Oral, resp. rate 16, height 5' 2" (1.575 m), weight 107 lb 5.8 oz (48.7 kg), SpO2 100 %.  ECOG 0 General appearance: alert and cooperative appeared without distress. Head: atraumatic without any abnormalities. Eyes: conjunctivae/corneas clear. PERRL.  Sclera anicteric. Throat: lips, mucosa, and tongue normal; without oral thrush or ulcers. Resp:  clear to auscultation bilaterally without rhonchi, wheezes or dullness to percussion. Cardio: regular rate and rhythm, S1, S2 normal, no murmur, click, rub or gallop GI: soft, non-tender; bowel sounds normal; no masses,  no organomegaly Skin: Without ecchymosis or petechiae. Lymph nodes: Cervical, supraclavicular, and axillary nodes normal. Neurologic: Grossly normal without any motor, sensory or deep tendon reflexes. Musculoskeletal: No joint deformity or effusion.  Recent Labs    02/20/18 0237 02/22/18 0311 02/22/18 1043  WBC 5.0 13.7*  --   HGB 13.0 10.5*  --   HCT 38.1 30.5*  --   PLT 56* 36* 45*   Recent Labs    02/20/18 0237 02/22/18 3154  NA 138 139  K 3.2* 2.8*  CL 116* 117*  CO2 16* 18*  GLUCOSE 76 65*  BUN <5* <5*  CREATININE 0.54 0.58  CALCIUM 8.2* 8.5*     Blood smear review: No evidence of schistocytes or red cell fragments.  No evidence of blasts or immature white cells noted on her peripheral smear.  Ct Head Wo Contrast  Result Date: 02/16/2018 CLINICAL DATA:  Generalize weakness and slurred speech for 2 weeks. EXAM: CT HEAD WITHOUT CONTRAST TECHNIQUE: Contiguous axial images were obtained from the base of the skull through the vertex without intravenous contrast. COMPARISON:  04/01/2005 FINDINGS: Brain: No evidence of acute infarction, hemorrhage, hydrocephalus, extra-axial collection or mass lesion/mass effect. Vascular: Mild intracranial arterial vascular calcifications. Skull: Calvarium appears intact. Sinuses/Orbits: Paranasal sinuses and mastoid air cells are clear. Other: None. IMPRESSION: No acute intracranial abnormality. Electronically Signed   By: Lucienne Capers M.D.   On: 02/16/2018 06:24   Mr Brain Wo Contrast  Result Date: 02/16/2018 CLINICAL DATA:  Weight loss and weakness. EXAM: MRI HEAD WITHOUT CONTRAST TECHNIQUE: Multiplanar, multiecho pulse sequences of the brain and surrounding structures were obtained without intravenous contrast.  COMPARISON:  CT same day FINDINGS: Brain: Generalized atrophy. Diffusion imaging does not show any acute or subacute infarction. The brainstem and cerebellum are normal. Mild chronic small-vessel ischemic change of the cerebral hemispheric deep white matter. No cortical or large vessel territory infarction. No mass lesion, hemorrhage, hydrocephalus or extra-axial collection. Vascular: Major vessels at the base of the brain show flow. Skull and upper cervical spine: Negative Sinuses/Orbits: Clear/normal Other: None IMPRESSION: No acute or reversible finding. Generalized atrophy. Mild chronic small-vessel ischemic change of the cerebral hemispheric white matter. Electronically Signed   By: Nelson Chimes M.D.   On: 02/16/2018 21:52   Nm Hepato W/eject Fract  Result Date: 02/18/2018 CLINICAL DATA:  Weight loss. EXAM: NUCLEAR MEDICINE HEPATOBILIARY IMAGING WITH GALLBLADDER EF TECHNIQUE: Sequential images of the abdomen were obtained out to 60 minutes following intravenous administration of radiopharmaceutical. After oral ingestion of Ensure, gallbladder ejection fraction was determined. At 60 min, normal ejection fraction is greater than 33%. RADIOPHARMACEUTICALS:  5.3 mCi Tc-72m Choletec IV COMPARISON:  None. FINDINGS: Prompt uptake and biliary excretion of activity by the liver is seen. Gallbladder activity is visualized, consistent with patency of cystic duct. Biliary activity passes into small bowel, consistent with patent common bile duct. The gallbladder emptied spontaneously before the Ensure. As a result, an ejection fraction cannot be accurately calculated. That being said, the gallbladder is not visualized after ensure and gallbladder emptying is near-complete. IMPRESSION: The gallbladder emptied spontaneously before consumption of Ensure. While an ejection fraction cannot be calculated, gallbladder emptying is near complete. Electronically Signed   By: DDorise BullionIII M.D   On: 02/18/2018 13:40   Ct  Angio Abd/pel W And/or Wo Contrast  Result Date: 02/05/2018 CLINICAL DATA:  Persistent acute abdominal pain, nausea, emesis and diarrhea EXAM: CT ANGIOGRAPHY ABDOMEN AND PELVIS WITH CONTRAST AND WITHOUT CONTRAST TECHNIQUE: Multidetector CT imaging of the abdomen and pelvis was performed using the standard protocol during bolus administration of intravenous contrast. Multiplanar reconstructed images and MIPs were obtained and reviewed to evaluate the vascular anatomy. CONTRAST:  1019mISOVUE-370 IOPAMIDOL (ISOVUE-370) INJECTION 76% COMPARISON:  07/29/2017 FINDINGS: VASCULAR Aorta: Aorta is atherosclerotic and mildly ectatic, proximal aortic diameter is 2.8 cm in the suprarenal aorta. Negative for significant aneurysm, dissection, occlusive process, retroperitoneal hemorrhage/hematoma, or rupture. Celiac: Widely patent including its branches SMA: Widely  patent including its branches Renals: Widely patent bilaterally.  No accessory renal artery. IMA: Remains patent off the distal aorta including its branches Inflow: Atherosclerotic and tortuous iliac vessels without inflow disease or occlusion. No iliac aneurysm or dissection. The common, internal and external iliac arteries remain patent. Proximal Outflow: The common femoral, proximal profunda femoral, and superficial femoral arteries demonstrated are also widely patent. Veins: No veno-occlusive process. Review of the MIP images confirms the above findings. NON-VASCULAR Lower chest: No acute abnormality. Hepatobiliary: Diffuse hypoattenuation of the liver compatible with hepatic steatosis. No biliary dilatation or focal hepatic abnormality. Gallbladder mildly distended, nonspecific. Common bile duct nondilated. Pancreas: Unremarkable. No pancreatic ductal dilatation or surrounding inflammatory changes. Spleen: Normal in size without focal abnormality. Adrenals/Urinary Tract: Adrenal glands are unremarkable. Kidneys are normal, without renal calculi, focal lesion, or  hydronephrosis. Bladder is unremarkable. Stomach/Bowel: Negative for bowel obstruction, significant dilatation, ileus, or free air. No fluid collection, abscess or ascites. Scattered colonic diverticulosis. Diffuse mild colonic wall thickening/edema suggesting nonspecific colitis. Appendix unremarkable. Lymphatic: No adenopathy. Reproductive: Uterus and bilateral adnexa are unremarkable. Other: No abdominal wall hernia or abnormality. No abdominopelvic ascites. Musculoskeletal: Degenerative changes of the spine. Multilevel facet arthropathy from L2-S1. Acute osseous finding. IMPRESSION: VASCULAR Aortoiliac atherosclerosis, mild ectasia and tortuosity without acute vascular process. Maximal diameter 2.8 cm as above. Ectatic abdominal aorta at risk for aneurysm development. Recommend followup by ultrasound in 5 years. This recommendation follows ACR consensus guidelines: White Paper of the ACR Incidental Findings Committee II on Vascular Findings. J Am Coll Radiol 2013; 10:789-794. Patent mesenteric and renal vasculature without occlusive disease Patent tortuous iliac vessels without inflow disease. NON-VASCULAR Hepatic steatosis Diffuse colonic wall thickening/edema compatible with nonspecific colitis. Scattered minor diverticulosis as well. No fluid collection, abscess, perforation, obstruction pattern or ascites. Electronically Signed   By: Jerilynn Mages.  Shick M.D.   On: 02/05/2018 19:50   US Abdomen Limited Ruq  Result Date: 02/05/2018 CLINICAL DATA:  Abdominal pain. EXAM: ULTRASOUND ABDOMEN LIMITED RIGHT UPPER QUADRANT COMPARISON:  None. FINDINGS: Gallbladder: No gallstones or wall thickening visualized. No sonographic Murphy sign noted by sonographer. Common bile duct: Diameter: 6.9 mm Liver: Hepatic steatosis. No focal mass. Portal vein is patent on color Doppler imaging with normal direction of blood flow towards the liver. IMPRESSION: 1. The gallbladder is normal in appearance. 2. The common bile duct measures 6.9  mm which is borderline for age. Recommend correlation with labs. If there is concern for biliary obstruction, recommend an MRCP or ERCP. Electronically Signed   By: Dorise Bullion III M.D   On: 02/05/2018 21:23    Assessment and Plan:   67 year old woman with the following issues:  1.  Thrombocytopenia: Appears to be acute and reactive in nature.  Her peripheral smear was personally reviewed today and did not show any evidence of schistocytes.  There is no evidence of red cell fragments to suggest a microangiopathy.  The differential diagnosis at this time would include reactive thrombocytopenia related to acute illness, medication or possible infection.  TTP and HUS are unlikely.  Primary hematological malignancy would be considered less likely given her normal platelets on admission.I agree with obtaining DIC profile which is currently pending at this time.  Her peripheral smear does not suggest this finding.  Hepatic steatosis could also be contributing factor.  From a management standpoint, her platelet count is adequate and does not require any intervention.  I recommended periodic monitoring with CBC which I anticipate recovery in the next few days.  Her thrombocytopenia most likely reactive in nature.  2.  Anemia: Appears to be related to chronic disease with iron level that is low and ferritin that is very high.  Her hemoglobin is adequate at this time and does not require intervention.  I recommended continued supportive management as you are doing and will consider a bone marrow biopsy if her counts do not recover appropriately as anticipated.  40  minutes was spent with the patient face-to-face today.  More than 50% of time was dedicated to reviewing laboratory data, peripheral smear and coordinating plan of care.

## 2018-02-22 NOTE — Progress Notes (Signed)
Physical Therapy Treatment Patient Details Name: Maria Avila MRN: [id number] DOB: 05/29/1950 Today's Date: 02/22/2018    History of Present Illness Maria Avila is a 67 y.o. female with a past medical history significant for NIDDM, HTN who presents with several months weight loss, malaise and weakness, now with periumbilical pain and vomiting.    PT Comments    Patient seen for mobility progression. Pt requires +2 assist for functional transfers and follows single step cues inconsistently. Pt disoriented to time and situation during session. Husband present and supportive.    Follow Up Recommendations  SNF;Supervision/Assistance - 24 hour     Equipment Recommendations  Other (comment)(TBA)    Recommendations for Other Services Speech consult;Rehab consult     Precautions / Restrictions Precautions Precautions: Fall    Mobility  Bed Mobility               General bed mobility comments: pt OOB in chair upon arrival  Transfers Overall transfer level: Needs assistance Equipment used: None Transfers: Sit to/from Stand Sit to Stand: Max assist;+2 safety/equipment         General transfer comment: pt stood from recliner, Stedy standing frame, and BSC; multimodal cues for hand placement and sequencing; pt requires assitance to reach with R UE and c/o pain but unable to describe origin of pain and assist to power up into standing and sitting due to poor eccentric loading  Ambulation/Gait                 Stairs             Wheelchair Mobility    Modified Rankin (Stroke Patients Only)       Balance Overall balance assessment: Needs assistance Sitting-balance support: Feet supported;Bilateral upper extremity supported Sitting balance-Leahy Scale: Poor     Standing balance support: Bilateral upper extremity supported Standing balance-Leahy Scale: Poor                              Cognition Arousal/Alertness: Awake/alert Behavior  During Therapy: Flat affect Overall Cognitive Status: Impaired/Different from baseline Area of Impairment: Attention;Following commands;Problem solving;Orientation;Safety/judgement;Awareness                 Orientation Level: Disoriented to;Time;Situation Current Attention Level: Sustained   Following Commands: Follows one step commands inconsistently;Follows one step commands with increased time Safety/Judgement: Decreased awareness of deficits;Decreased awareness of safety Awareness: Intellectual Problem Solving: Slow processing;Difficulty sequencing;Requires verbal cues        Exercises      General Comments        Pertinent Vitals/Pain Pain Assessment: Faces Faces Pain Scale: Hurts a little bit Pain Location: generalized   Pain Descriptors / Indicators: Grimacing Pain Intervention(s): Monitored during session;Repositioned    Home Living                      Prior Function            PT Goals (current goals can now be found in the care plan section) Progress towards PT goals: Progressing toward goals    Frequency    Min 2X/week      PT Plan Current plan remains appropriate    Co-evaluation              AM-PAC PT "6 Clicks" Daily Activity  Outcome Measure  Difficulty turning over in bed (including adjusting bedclothes, sheets and blankets)?: Unable Difficulty moving from lying  on back to sitting on the side of the bed? : Unable Difficulty sitting down on and standing up from a chair with arms (e.g., wheelchair, bedside commode, etc,.)?: Unable Help needed moving to and from a bed to chair (including a wheelchair)?: A Lot Help needed walking in hospital room?: Total Help needed climbing 3-5 steps with a railing? : Total 6 Click Score: 7    End of Session Equipment Utilized During Treatment: Gait belt Activity Tolerance: Patient tolerated treatment well Patient left: with call bell/phone within reach;with family/visitor present;in  chair;with chair alarm set Nurse Communication: Mobility status PT Visit Diagnosis: Muscle weakness (generalized) (M62.81);Adult, failure to thrive (R62.7);Other abnormalities of gait and mobility (R26.89);History of falling (Z91.81) Hemiplegia - Right/Left: Left Hemiplegia - dominant/non-dominant: Non-dominant Hemiplegia - caused by: Unspecified Pain - part of body: (abdomen)     Time: 1157-2620 PT Time Calculation (min) (ACUTE ONLY): 23 min  Charges:  $Therapeutic Activity: 23-37 mins                     Earney Navy, PTA Acute Rehabilitation Services Pager: [phone] Office: [phone]     Darliss Cheney 02/22/2018, 1:40 PM

## 2018-02-23 LAB — CBC WITH DIFFERENTIAL/PLATELET
ABS IMMATURE GRANULOCYTES: 0.04 10*3/uL (ref 0.00–0.07)
BASOS ABS: 0 10*3/uL (ref 0.0–0.1)
Basophils Relative: 0 %
Eosinophils Absolute: 0 10*3/uL (ref 0.0–0.5)
Eosinophils Relative: 0 %
HCT: 31.9 % — ABNORMAL LOW (ref 36.0–46.0)
Hemoglobin: 11.5 g/dL — ABNORMAL LOW (ref 12.0–15.0)
IMMATURE GRANULOCYTES: 1 %
Lymphocytes Relative: 20 %
Lymphs Abs: 1.6 10*3/uL (ref 0.7–4.0)
MCH: 31.7 pg (ref 26.0–34.0)
MCHC: 36.1 g/dL — ABNORMAL HIGH (ref 30.0–36.0)
MCV: 87.9 fL (ref 80.0–100.0)
MONOS PCT: 8 %
Monocytes Absolute: 0.6 10*3/uL (ref 0.1–1.0)
NEUTROS ABS: 5.4 10*3/uL (ref 1.7–7.7)
NEUTROS PCT: 71 %
NRBC: 0 % (ref 0.0–0.2)
PLATELETS: 72 10*3/uL — AB (ref 150–400)
RBC: 3.63 MIL/uL — AB (ref 3.87–5.11)
RDW: 14.7 % (ref 11.5–15.5)
WBC: 7.6 10*3/uL (ref 4.0–10.5)

## 2018-02-23 LAB — COMPREHENSIVE METABOLIC PANEL
ALT: 33 U/L (ref 0–44)
AST: 31 U/L (ref 15–41)
Albumin: 1.9 g/dL — ABNORMAL LOW (ref 3.5–5.0)
Alkaline Phosphatase: 92 U/L (ref 38–126)
Anion gap: 4 — ABNORMAL LOW (ref 5–15)
BUN: 5 mg/dL — ABNORMAL LOW (ref 8–23)
CHLORIDE: 116 mmol/L — AB (ref 98–111)
CO2: 18 mmol/L — AB (ref 22–32)
CREATININE: 0.63 mg/dL (ref 0.44–1.00)
Calcium: 8.6 mg/dL — ABNORMAL LOW (ref 8.9–10.3)
GFR calc non Af Amer: >60 mL/min (ref >60)
Glucose, Bld: 84 mg/dL (ref 70–99)
Potassium: 3.4 mmol/L — ABNORMAL LOW (ref 3.5–5.1)
SODIUM: 138 mmol/L (ref 135–145)
Total Bilirubin: 1 mg/dL (ref 0.3–1.2)
Total Protein: 4.8 g/dL — ABNORMAL LOW (ref 6.5–8.1)

## 2018-02-23 LAB — CORTISOL: Cortisol, Plasma: 16.6 ug/dL

## 2018-02-23 LAB — MAGNESIUM: Magnesium: 1.9 mg/dL (ref 1.7–2.4)

## 2018-02-23 MED ORDER — PEG 3350-KCL-NA BICARB-NACL 420 G PO SOLR
4000.0000 mL | Freq: Once | ORAL | Status: AC
  Administered 2018-02-24: 4000 mL via ORAL
  Filled 2018-02-23: qty 4000

## 2018-02-23 MED ORDER — SODIUM CHLORIDE 0.9 % IV SOLN
INTRAVENOUS | Status: DC
  Administered 2018-02-23: 10 mL/h via INTRAVENOUS

## 2018-02-23 MED ORDER — POLYETHYLENE GLYCOL 3350 17 GM/SCOOP PO POWD
1.0000 | Freq: Once | ORAL | Status: DC
  Filled 2018-02-23 (×2): qty 255

## 2018-02-23 NOTE — Plan of Care (Signed)
  Problem: Education: Goal: Knowledge of General Education information will improve Description Including pain rating scale, medication(s)/side effects and non-pharmacologic comfort measures Outcome: Progressing   Problem: Health Behavior/Discharge Planning: Goal: Ability to manage health-related needs will improve Outcome: Progressing   Problem: Clinical Measurements: Goal: Ability to maintain clinical measurements within normal limits will improve Outcome: Progressing Goal: Will remain free from infection Outcome: Progressing Goal: Diagnostic test results will improve Outcome: Progressing Goal: Respiratory complications will improve Outcome: Progressing Goal: Cardiovascular complication will be avoided Outcome: Progressing   Problem: Activity: Goal: Risk for activity intolerance will decrease Outcome: Progressing   Problem: Nutrition: Goal: Adequate nutrition will be maintained Outcome: Progressing   Problem: Coping: Goal: Level of anxiety will decrease Outcome: Progressing   Problem: Elimination: Goal: Will not experience complications related to bowel motility Outcome: Progressing Goal: Will not experience complications related to urinary retention Outcome: Progressing   Problem: Pain Managment: Goal: General experience of comfort will improve Outcome: Progressing   Problem: Safety: Goal: Ability to remain free from injury will improve Outcome: Progressing   Problem: Skin Integrity: Goal: Risk for impaired skin integrity will decrease Outcome: Progressing   Tavarious Freel C Lashala Laser, RN 02/23/2018  

## 2018-02-23 NOTE — Progress Notes (Signed)
PROGRESS NOTE    Maria Avila  JHE:[id number] DOB: 18-May-1950 DOA: 02/16/2018 PCP: [name], MD   Brief Narrative:  67 year old with history of diabetes mellitus type 2, essential hypertension, weight loss, anorexia and failure to thrive comes back to the hospital again with complaints of abdominal pain and inability to tolerate much of oral diet.  This has been an ongoing issue for 6 months requiring her multiple hospital visits for dehydration.  She has been evaluated previously for pancytopenia by oncology and also by gastroenterology.  CT angiogram done earlier this month showed diffuse colonic wall thickening which is nonspecific.  Previous endoscopies has been unrevealing.   Assessment & Plan:   Principal Problem:   Malnutrition of moderate degree Active Problems:   Weakness   Nausea vomiting and diarrhea   Diabetes (HCC)   Essential hypertension   Pancytopenia (HCC)   Hypotension  Failure to thrive and weight loss Moderate severe protein calorie malnutrition with dehydration. Persistent diarrhea -Patient has had an extensive work-up for this which has all been unrevealing.  Gastroenterology has been consulted again with plans for performing colonoscopy during this visit -Recent HIDA scan was negative. - Gentle hydration-normal saline 75 cc/h  Pancytopenia - Previous work-up has been negative.  DIC panel has been ordered.  Oncology is following.  Appreciate their input, hopeful that her counts will recover.  Stage II gluteal ulcer -Local wound care . Depression -Continue mirtazapine.   Essential hypertension - Continue home medication  Hyperlipidemia -Continue statin   DVT prophylaxis: SCDs Code Status: Full code Family Communication: None at bedside Disposition Plan: Maintain inpatient stay until her oral intake has improved and has completed a thorough work-up.  At this time she is at a very high risk for recurrent dehydration and  malnourishment.  Consultants:   Gastroenterology  Oncology  Procedures:   None  Antimicrobials:   None   Subjective: Continues to have very poor desire to eat.  Laying in the bed.  Review of Systems Otherwise negative except as per HPI, including: General: Denies fever, chills, night sweats or unintended weight loss. Resp: Denies cough, wheezing, shortness of breath. Cardiac: Denies chest pain, palpitations, orthopnea, paroxysmal nocturnal dyspnea. GI: Denies abdominal pain, nausea, vomiting,constipation GU: Denies dysuria, frequency, hesitancy or incontinence MS: Denies muscle aches, joint pain or swelling Neuro: Denies headache, neurologic deficits (focal weakness, numbness, tingling), abnormal gait Psych: Denies anxiety, depression, SI/HI/AVH Skin: Denies new rashes or lesions ID: Denies sick contacts, exotic exposures, travel  Objective: Vitals:   02/22/18 1500 02/22/18 2046 02/23/18 0522 02/23/18 0820  BP:  105/85 (!) 117/102 113/83  Pulse:  93 (!) 103 96  Resp:  17 18   Temp: 97.7 F (36.5 C)  (!) 97.3 F (36.3 C) 97.6 F (36.4 C)  TempSrc: Oral  Oral Oral  SpO2:  100% 99% 100%  Weight:      Height:        Intake/Output Summary (Last 24 hours) at 02/23/2018 1231 Last data filed at 02/23/2018 0300 Gross per 24 hour  Intake 1442.46 ml  Output 1000 ml  Net 442.46 ml   Filed Weights   02/16/18 0648  Weight: 48.7 kg    Examination:  General exam: Appears calm and comfortable, elderly frail-appearing with poor dentition.  Bilateral temporal wasting.  Overall appears very deconditioned Respiratory system: Clear to auscultation. Respiratory effort normal. Cardiovascular system: S1 & S2 heard, RRR. No JVD, murmurs, rubs, gallops or clicks. No pedal edema. Gastrointestinal system: Abdomen is nondistended, soft  and nontender. No organomegaly or masses felt. Normal bowel sounds heard. Central nervous system: Alert and oriented. No focal neurological  deficits. Extremities: Symmetric 4 x 5 power. Skin: No rashes, lesions or ulcers.  Erythema noted over her gluteal area Psychiatry: Slightly depressed    Data Reviewed:   CBC: Recent Labs  Lab 02/17/18 0205 02/19/18 0806 02/19/18 1447 02/20/18 0237 02/22/18 0311 02/22/18 1043  WBC 5.4 3.7*  --  5.0 13.7*  --   NEUTROABS  --   --   --   --  9.6*  --   HGB 8.4* 7.3* 6.8* 13.0 10.5*  --   HCT 25.2*  23.7* 21.0* 20.0* 38.1 30.5*  --   MCV 98.1 96.8  --  90.9 88.7  --   PLT 122* 62*  --  56* 36* 45*   Basic Metabolic Panel: Recent Labs  Lab 02/18/18 0314 02/19/18 0806 02/20/18 0237 02/22/18 0311 02/22/18 1043 02/23/18 0255  NA 137 139 138 139  --  138  K 3.9 3.4* 3.2* 2.8*  --  3.4*  CL 114* 119* 116* 117*  --  116*  CO2 18* 19* 16* 18*  --  18*  GLUCOSE 119* 78 76 65*  --  84  BUN 6* <5* <5* <5*  --  5*  CREATININE 0.49 0.42* 0.54 0.58  --  0.63  CALCIUM 7.3* 7.6* 8.2* 8.5*  --  8.6*  MG  --  1.6*  --   --  1.4* 1.9   GFR: Estimated Creatinine Clearance: 51.7 mL/min (by C-G formula based on SCr of 0.63 mg/dL). Liver Function Tests: Recent Labs  Lab 02/17/18 0205 02/18/18 0314 02/23/18 0255  AST 24 32 31  ALT 18 23 33  ALKPHOS 48 51 92  BILITOT 0.9 0.8 1.0  PROT 4.7* 4.6* 4.8*  ALBUMIN 2.1* 1.9* 1.9*   No results for input(s): LIPASE, AMYLASE in the last 168 hours. No results for input(s): AMMONIA in the last 168 hours. Coagulation Profile: Recent Labs  Lab 02/22/18 1043  INR 1.29   Cardiac Enzymes: No results for input(s): CKTOTAL, CKMB, CKMBINDEX, TROPONINI in the last 168 hours. BNP (last 3 results) No results for input(s): PROBNP in the last 8760 hours. HbA1C: No results for input(s): HGBA1C in the last 72 hours. CBG: No results for input(s): GLUCAP in the last 168 hours. Lipid Profile: No results for input(s): CHOL, HDL, LDLCALC, TRIG, CHOLHDL, LDLDIRECT in the last 72 hours. Thyroid Function Tests: No results for input(s): TSH, T4TOTAL,  FREET4, T3FREE, THYROIDAB in the last 72 hours. Anemia Panel: Recent Labs    02/22/18 0311  FERRITIN 2,206*  TIBC NOT CALCULATED  IRON 21*   Sepsis Labs: No results for input(s): PROCALCITON, LATICACIDVEN in the last 168 hours.  Recent Results (from the past 240 hour(s))  Culture, blood (Routine X 2) w Reflex to ID Panel     Status: None   Collection Time: 02/16/18  5:00 AM  Result Value Ref Range Status   Specimen Description BLOOD LEFT ARM  Final   Special Requests   Final    BOTTLES DRAWN AEROBIC AND ANAEROBIC Blood Culture adequate volume   Culture   Final    NO GROWTH 5 DAYS Performed at [facility], [address], [place], [place] [address]    Report Status 02/21/2018 FINAL  Final  Culture, blood (Routine X 2) w Reflex to ID Panel     Status: None   Collection Time: 02/16/18  5:50 AM  Result Value Ref Range Status   Specimen Description BLOOD RIGHT ARM  Final   Special Requests   Final    BOTTLES DRAWN AEROBIC AND ANAEROBIC Blood Culture adequate volume   Culture   Final    NO GROWTH 5 DAYS Performed at [facility], [address] [place], [place] [address]    Report Status 02/21/2018 FINAL  Final         Radiology Studies: Dg Chest Port 1 View  Result Date: 02/22/2018 CLINICAL DATA:  Leukocytosis. EXAM: PORTABLE CHEST 1 VIEW COMPARISON:  None. FINDINGS: Subtle asymmetric prominence of the LEFT perihilar shadow, suspicious for perihilar pneumonia and/or lymphadenopathy. Subtle opacity at the LEFT lung base, compatible with pneumonia or mild atelectasis. RIGHT lung is clear. Osseous structures about the chest are unremarkable. IMPRESSION: 1. Suspected LEFT perihilar pneumonia and/or lymphadenopathy. Consider chest CT for confirmation. 2. Subtle opacity at the LEFT lung base which could represent pneumonia, atelectasis and/or small pleural effusion. Electronically Signed   By: Franki Cabot M.D.   On: 02/22/2018 16:10        Scheduled Meds: .  feeding supplement (ENSURE ENLIVE)  237 mL Oral TID BM  . Gerhardt's butt cream   Topical QID  . lipase/protease/amylase  24,000 Units Oral TID WC  . mouth rinse  15 mL Mouth Rinse BID  . mirtazapine  7.5 mg Oral QHS  . multivitamin with minerals  1 tablet Oral Daily  . pantoprazole  40 mg Oral Daily  . pravastatin  40 mg Oral QPM  . thiamine  100 mg Oral Daily   Continuous Infusions: . 0.9 % sodium chloride with kcl 75 mL/hr at 02/23/18 0300     LOS: 4 days   Time spent= 40 mins    Ankit Arsenio Loader, MD [facility] Pager [phone]   If 7PM-7AM, please contact night-coverage [email or web address] Password [facility] 02/23/2018, 12:31 PM

## 2018-02-23 NOTE — Plan of Care (Signed)
  Problem: Clinical Measurements: Goal: Ability to maintain clinical measurements within normal limits will improve Outcome: Progressing Goal: Will remain free from infection Outcome: Progressing Goal: Diagnostic test results will improve Outcome: Progressing   Problem: Activity: Goal: Risk for activity intolerance will decrease Outcome: Progressing   Problem: Nutrition: Goal: Adequate nutrition will be maintained Outcome: Progressing   Problem: Pain Managment: Goal: General experience of comfort will improve Outcome: Progressing   

## 2018-02-23 NOTE — Progress Notes (Signed)
Nutrition Follow-up  DOCUMENTATION CODES:   Non-severe (moderate) malnutrition in context of chronic illness  INTERVENTION:   -Continue Ensure Enlive po TID, each supplement provides 350 kcal and 20 grams of protein -Continue MVI with minerals daily -Magic Cup TID with meals, each supplement provides 290 kcals and 9 grams protein  NUTRITION DIAGNOSIS:   Moderate Malnutrition related to chronic illness(colitis) as evidenced by energy intake < or equal to 75% for > or equal to 1 month, mild fat depletion, moderate fat depletion, mild muscle depletion, moderate muscle depletion, percent weight loss.  Ongoing  GOAL:   Patient will meet greater than or equal to 90% of their needs  Progressing  MONITOR:   PO intake, Supplement acceptance, Labs, Weight trends, Skin, I & O's  REASON FOR ASSESSMENT:   Malnutrition Screening Tool, Consult Assessment of nutrition requirement/status  ASSESSMENT:   Maria Avila is a 67 y.o. female with a past medical history significant for NIDDM, HTN who presents with several months weight loss, malaise and weakness, now with periumbilical pain and vomiting.  10/15- creon added 10/16- remeron added 10/17- s/p HIDA scan, which was negative  Reviewed I/O's: +563 ml x24 hours and +10.7 L since admission  Spoke with pt and husband. Pt appears physically better from RD's last visit. Pt and husband report that appetite has improved- per husband, pt consumed some chicken and applesauce for dinner last night. Observed breakfast tray- pt consumed only orange juice off meal tray.   Pt has been consuming Ensure supplements- consumed approximately 50% of morning dose.   Pt husband had questions and concerns about pt diet due to history of DM and irritable bowel disease. Educated pt husband on current diet of soft, which is a low fiber diet to help GI distress. Pt husband felt encouraged when RD showed husband how much supplement she drank.   Encouraged pt  to continue to consume food off meal trays and drink supplements.   Labs reviewed: K: 3.4, Mg WDL.   Diet Order:   Diet Order            DIET SOFT Room service appropriate? Yes; Fluid consistency: Thin  Diet effective now        Diet general              EDUCATION NEEDS:   Education needs have been addressed  Skin:  Skin Assessment: Reviewed RN Assessment  Last BM:  02/21/18  Height:   Ht Readings from Last 1 Encounters:  02/16/18 5\' 2"  (1.575 m)    Weight:   Wt Readings from Last 1 Encounters:  02/16/18 48.7 kg    Ideal Body Weight:  50 kg  BMI:  Body mass index is 19.64 kg/m.  Estimated Nutritional Needs:   Kcal:  1700-1900  Protein:  95-110 grams  Fluid:  >1.7 L    Jonathon Tan A. Jimmye Norman, RD, LDN, CDE Pager: [phone] After hours Pager: [phone]

## 2018-02-23 NOTE — Social Work (Signed)
CSW continuing to follow, pt preferred facility [facility] has insurance authorization when medically stable for discharge.  Westley Hummer, MSW, [place] Work [phone]

## 2018-02-23 NOTE — Progress Notes (Signed)
[facility] Gastroenterology Progress Note  Maria Avila 67 y.o. 25-Nov-1950   Subjective: Weak. Denies diarrhea or abd pain. Sitting in bedside chair with husband in room.  Objective: Vital signs: Vitals:   02/23/18 0522 02/23/18 0820  BP: (!) 117/102 113/83  Pulse: (!) 103 96  Resp: 18   Temp: (!) 97.3 F (36.3 C) 97.6 F (36.4 C)  SpO2: 99% 100%    Physical Exam: Gen: lethargic, elderly, thin, no acute distress  HEENT: anicteric sclera CV: RRR Chest: CTA B Abd: periumbilical tenderness with guarding, soft, nondistended, +BS  Lab Results: Recent Labs    02/22/18 0311 02/22/18 1043 02/23/18 0255  NA 139  --  138  K 2.8*  --  3.4*  CL 117*  --  116*  CO2 18*  --  18*  GLUCOSE 65*  --  84  BUN <5*  --  5*  CREATININE 0.58  --  0.63  CALCIUM 8.5*  --  8.6*  MG  --  1.4* 1.9   Recent Labs    02/23/18 0255  AST 31  ALT 33  ALKPHOS 92  BILITOT 1.0  PROT 4.8*  ALBUMIN 1.9*   Recent Labs    02/22/18 0311 02/22/18 1043  WBC 13.7*  --   NEUTROABS 9.6*  --   HGB 10.5*  --   HCT 30.5*  --   MCV 88.7  --   PLT 36* 45*      Assessment/Plan: Chronic diarrhea with N/V and pancytopenia - tentatively will plan to do colonoscopy on 02/25/18. Will give a two day prep starting this afternoon and will change to clear liquid diet for dinner today. Supportive care. Will follow.   [name] 02/23/2018, 1:29 PM  Questions please call [phone] ID: Maria Avila, female   DOB: [birth year], 67 y.o.   MRN: [id number]

## 2018-02-23 NOTE — Progress Notes (Signed)
Occupational Therapy Treatment Patient Details Name: Maria Avila MRN: [id number] DOB: 1950-09-23 Today's Date: 02/23/2018    History of present illness Maria Avila is a 67 y.o. female with a past medical history significant for NIDDM, HTN who presents with several months weight loss, malaise and weakness, now with periumbilical pain and vomiting.   OT comments  Pt with noticeable change in transfer between sessions. Pt now total +2 max (A) for sit<>stand with immediate BIL LE buckle. Pt previous session mod (A) with RW to walk around the bed for chair transfer with ataxic movement. Pt with noticeable L lean and R UE pushing. Pt at high risk for falls and recommend transfer only with stedy to ensure bil LE blocked.    Follow Up Recommendations  CIR    Equipment Recommendations  Other (comment)    Recommendations for Other Services Rehab consult    Precautions / Restrictions Precautions Precautions: Fall Precaution Comments: frequent stools       Mobility Bed Mobility Overal bed mobility: Needs Assistance Bed Mobility: Rolling Rolling: Mod assist   Supine to sit: Mod assist     General bed mobility comments: pt pushign posterior and L lean   Transfers Overall transfer level: Needs assistance   Transfers: Stand Pivot Transfers;Sit to/from Stand Sit to Stand: +2 physical assistance;Max assist Stand pivot transfers: +2 physical assistance;Max assist       General transfer comment: pt unable to susatin static standing. pt with x2 transfer from bed and x3 transfers from chair surface. pt requires total blocking of bil LE and very close support facilitation at hips for extension and core for extension    Balance Overall balance assessment: Needs assistance Sitting-balance support: Bilateral upper extremity supported;Feet supported Sitting balance-Leahy Scale: Poor       Standing balance-Leahy Scale: Zero                             ADL either  performed or assessed with clinical judgement   ADL Overall ADL's : Needs assistance/impaired                                       General ADL Comments: pt incontinent of bladder on arrival and no awareness. pt with cream applied to skin break down in peri area. pt with mesh panties for transfer. pt with noticeable R UE pushing. pt with posterior pushing with sit<.stand. pt with immediate buckle of bil le. pt requires blocking of bil LE due to inability to sustain static standing. pt was able to transfer with OT mod (A) RW previous session. this is a change in transfer and static eob sitting     Vision       Perception     Praxis      Cognition Arousal/Alertness: Awake/alert Behavior During Therapy: Flat affect Overall Cognitive Status: Impaired/Different from baseline Area of Impairment: Memory;Safety/judgement                   Current Attention Level: Sustained   Following Commands: Follows one step commands inconsistently;Follows one step commands with increased time Safety/Judgement: Decreased awareness of safety;Decreased awareness of deficits   Problem Solving: Slow processing          Exercises     Shoulder Instructions       General Comments pt with break down in peri area  noted with hygiene    Pertinent Vitals/ Pain       Pain Assessment: No/denies pain  Home Living                                          Prior Functioning/Environment              Frequency  Min 3X/week        Progress Toward Goals  OT Goals(current goals can now be found in the care plan section)  Progress towards OT goals: Not progressing toward goals - comment  Acute Rehab OT Goals Patient Stated Goal: none stated at this time OT Goal Formulation: Patient unable to participate in goal setting Time For Goal Achievement: 03/03/18 Potential to Achieve Goals: Good  Plan Discharge plan remains appropriate    Co-evaluation                  AM-PAC PT "6 Clicks" Daily Activity     Outcome Measure   Help from another person eating meals?: A Little Help from another person taking care of personal grooming?: A Lot Help from another person toileting, which includes using toliet, bedpan, or urinal?: A Lot Help from another person bathing (including washing, rinsing, drying)?: A Lot Help from another person to put on and taking off regular upper body clothing?: A Lot Help from another person to put on and taking off regular lower body clothing?: Total 6 Click Score: 12    End of Session Equipment Utilized During Treatment: Gait belt;Rolling walker  OT Visit Diagnosis: Unsteadiness on feet (R26.81);Muscle weakness (generalized) (M62.81)   Activity Tolerance Patient tolerated treatment well   Patient Left in chair;with call bell/phone within reach;with chair alarm set;with family/visitor present   Nurse Communication Mobility status;Precautions        Time: 1130-1149 OT Time Calculation (min): 19 min  Charges: OT General Charges $OT Visit: 1 Visit OT Treatments $Therapeutic Activity: 8-22 mins   Jeri Modena, OTR/L  Acute Rehabilitation Services Pager: [phone] Office: [phone] .    Parke Poisson B 02/23/2018, 3:42 PM

## 2018-02-24 LAB — CBC
HCT: 32.7 % — ABNORMAL LOW (ref 36.0–46.0)
HEMOGLOBIN: 11.4 g/dL — AB (ref 12.0–15.0)
MCH: 31 pg (ref 26.0–34.0)
MCHC: 34.9 g/dL (ref 30.0–36.0)
MCV: 88.9 fL (ref 80.0–100.0)
NRBC: 0 % (ref 0.0–0.2)
Platelets: 110 10*3/uL — ABNORMAL LOW (ref 150–400)
RBC: 3.68 MIL/uL — AB (ref 3.87–5.11)
RDW: 15 % (ref 11.5–15.5)
WBC: 7.7 10*3/uL (ref 4.0–10.5)

## 2018-02-24 LAB — COMPREHENSIVE METABOLIC PANEL
ALT: 31 U/L (ref 0–44)
ANION GAP: 3 — AB (ref 5–15)
AST: 26 U/L (ref 15–41)
Albumin: 1.9 g/dL — ABNORMAL LOW (ref 3.5–5.0)
Alkaline Phosphatase: 92 U/L (ref 38–126)
BUN: 6 mg/dL — ABNORMAL LOW (ref 8–23)
CHLORIDE: 115 mmol/L — AB (ref 98–111)
CO2: 20 mmol/L — AB (ref 22–32)
CREATININE: 0.69 mg/dL (ref 0.44–1.00)
Calcium: 8.7 mg/dL — ABNORMAL LOW (ref 8.9–10.3)
Glucose, Bld: 84 mg/dL (ref 70–99)
POTASSIUM: 4.6 mmol/L (ref 3.5–5.1)
SODIUM: 138 mmol/L (ref 135–145)
Total Bilirubin: 1 mg/dL (ref 0.3–1.2)
Total Protein: 4.8 g/dL — ABNORMAL LOW (ref 6.5–8.1)

## 2018-02-24 LAB — MAGNESIUM: Magnesium: 1.8 mg/dL (ref 1.7–2.4)

## 2018-02-24 NOTE — Progress Notes (Signed)
PROGRESS NOTE    Maria Avila  UDJ:[id number] DOB: [birth year] DOA: 02/16/2018 PCP: [name], MD   Brief Narrative:  67 year old with history of diabetes mellitus type 2, essential hypertension, weight loss, anorexia and failure to thrive comes back to the hospital again with complaints of abdominal pain and inability to tolerate much of oral diet.  This has been an ongoing issue for 6 months requiring her multiple hospital visits for dehydration.  She has been evaluated previously for pancytopenia by oncology and also by gastroenterology.  CT angiogram done earlier this month showed diffuse colonic wall thickening which is nonspecific.  Previous endoscopies has been unrevealing. GI plans on EGD and C scope on 02/25/18   Assessment & Plan:   Principal Problem:   Malnutrition of moderate degree Active Problems:   Weakness   Nausea vomiting and diarrhea   Diabetes (HCC)   Essential hypertension   Pancytopenia (HCC)   Hypotension  Failure to thrive and weight loss Moderate severe protein calorie malnutrition with dehydration. Persistent diarrhea - Previously patient has had an extensive work-up including HIDA scan which has been unrevealing.  Gastroenterology consulted.  Plans on performing endoscopy and colonoscopy 02/25/2018. -Aggressive bowel regimen and clear liquid diet.  N.p.o. past midnight. -Recent HIDA scan was negative. - Gentle hydration-normal saline 75 cc/h  Pancytopenia, improving - Her counts have improved.  Oncology is following.  Stage II gluteal ulcer -Local wound care . Depression  -Continue mirtazapine.   Essential hypertension - Continue home medication  Hyperlipidemia -Continue statin   DVT prophylaxis: SCDs Code Status: Full code Family Communication: Patient's husband at bedside Disposition Plan: Maintain inpatient stay until her p.o. intake improves.  She also needs inpatient work-up with endoscopy and colonoscopy which is planned  tomorrow.  Consultants:   Gastroenterology  Oncology  Procedures:   None  Antimicrobials:   None   Subjective: Patient is sitting up in the bed trying to eat her breakfast.  No complaints at the moment.  Review of Systems Otherwise negative except as per HPI, including: General = no fevers, chills, dizziness, malaise, fatigue HEENT/EYES = negative for pain, redness, loss of vision, double vision, blurred vision, loss of hearing, sore throat, hoarseness, dysphagia Cardiovascular= negative for chest pain, palpitation, murmurs, lower extremity swelling Respiratory/lungs= negative for shortness of breath, cough, hemoptysis, wheezing, mucus production Gastrointestinal= negative for nausea, vomiting,, abdominal pain, melena, hematemesis Genitourinary= negative for Dysuria, Hematuria, Change in Urinary Frequency MSK = Negative for arthralgia, myalgias, Back Pain, Joint swelling  Neurology= Negative for headache, seizures, numbness, tingling  Psychiatry= Negative for anxiety, depression, suicidal and homocidal ideation Allergy/Immunology= Medication/Food allergy as listed  Skin= Negative for Rash, lesions, ulcers, itching   Objective: Vitals:   02/23/18 0522 02/23/18 0820 02/23/18 2039 02/24/18 0508  BP: (!) 117/102 113/83 (!) 119/93 (!) 125/96  Pulse: (!) 103 96 91 92  Resp: 18   17  Temp: (!) 97.3 F (36.3 C) 97.6 F (36.4 C)  98.7 F (37.1 C)  TempSrc: Oral Oral    SpO2: 99% 100% 100% 100%  Weight:      Height:        Intake/Output Summary (Last 24 hours) at 02/24/2018 1134 Last data filed at 02/24/2018 0644 Gross per 24 hour  Intake 1687.56 ml  Output 700 ml  Net 987.56 ml   Filed Weights   02/16/18 0648  Weight: 48.7 kg    Examination:  Constitutional: Generally weak appearing, poor dentition. Eyes: PERRL, lids and conjunctivae normal ENMT: Mucous membranes  are moist. Posterior pharynx clear of any exudate or lesions.Normal dentition.  Neck: normal,  supple, no masses, no thyromegaly Respiratory: clear to auscultation bilaterally, no wheezing, no crackles. Normal respiratory effort. No accessory muscle use.  Cardiovascular: Regular rate and rhythm, no murmurs / rubs / gallops. No extremity edema. 2+ pedal pulses. No carotid bruits.  Abdomen: no tenderness, no masses palpated. No hepatosplenomegaly. Bowel sounds positive.  Musculoskeletal: no clubbing / cyanosis. No joint deformity upper and lower extremities. Good ROM, no contractures. Normal muscle tone.  Skin: no rashes, lesions, ulcers. No induration Neurologic: CN 2-12 grossly intact. Sensation intact, DTR normal. Strength 4/5 in all 4.  Psychiatric: Slightly depressed mood but no homicidal or suicidal ideation  Data Reviewed:   CBC: Recent Labs  Lab 02/19/18 0806 02/19/18 1447 02/20/18 0237 02/22/18 0311 02/22/18 1043 02/23/18 1242 02/24/18 0211  WBC 3.7*  --  5.0 13.7*  --  7.6 7.7  NEUTROABS  --   --   --  9.6*  --  5.4  --   HGB 7.3* 6.8* 13.0 10.5*  --  11.5* 11.4*  HCT 21.0* 20.0* 38.1 30.5*  --  31.9* 32.7*  MCV 96.8  --  90.9 88.7  --  87.9 88.9  PLT 62*  --  56* 36* 45* 72* 338*   Basic Metabolic Panel: Recent Labs  Lab 02/19/18 0806 02/20/18 0237 02/22/18 0311 02/22/18 1043 02/23/18 0255 02/24/18 0211  NA 139 138 139  --  138 138  K 3.4* 3.2* 2.8*  --  3.4* 4.6  CL 119* 116* 117*  --  116* 115*  CO2 19* 16* 18*  --  18* 20*  GLUCOSE 78 76 65*  --  84 84  BUN <5* <5* <5*  --  5* 6*  CREATININE 0.42* 0.54 0.58  --  0.63 0.69  CALCIUM 7.6* 8.2* 8.5*  --  8.6* 8.7*  MG 1.6*  --   --  1.4* 1.9 1.8   GFR: Estimated Creatinine Clearance: 51.7 mL/min (by C-G formula based on SCr of 0.69 mg/dL). Liver Function Tests: Recent Labs  Lab 02/18/18 0314 02/23/18 0255 02/24/18 0211  AST 32 31 26  ALT 23 33 31  ALKPHOS 51 92 92  BILITOT 0.8 1.0 1.0  PROT 4.6* 4.8* 4.8*  ALBUMIN 1.9* 1.9* 1.9*   No results for input(s): LIPASE, AMYLASE in the last 168  hours. No results for input(s): AMMONIA in the last 168 hours. Coagulation Profile: Recent Labs  Lab 02/22/18 1043  INR 1.29   Cardiac Enzymes: No results for input(s): CKTOTAL, CKMB, CKMBINDEX, TROPONINI in the last 168 hours. BNP (last 3 results) No results for input(s): PROBNP in the last 8760 hours. HbA1C: No results for input(s): HGBA1C in the last 72 hours. CBG: No results for input(s): GLUCAP in the last 168 hours. Lipid Profile: No results for input(s): CHOL, HDL, LDLCALC, TRIG, CHOLHDL, LDLDIRECT in the last 72 hours. Thyroid Function Tests: No results for input(s): TSH, T4TOTAL, FREET4, T3FREE, THYROIDAB in the last 72 hours. Anemia Panel: Recent Labs    02/22/18 0311  FERRITIN 2,206*  TIBC NOT CALCULATED  IRON 21*   Sepsis Labs: No results for input(s): PROCALCITON, LATICACIDVEN in the last 168 hours.  Recent Results (from the past 240 hour(s))  Culture, blood (Routine X 2) w Reflex to ID Panel     Status: None   Collection Time: 02/16/18  5:00 AM  Result Value Ref Range Status   Specimen Description BLOOD LEFT ARM  Final   Special Requests   Final    BOTTLES DRAWN AEROBIC AND ANAEROBIC Blood Culture adequate volume   Culture   Final    NO GROWTH 5 DAYS Performed at [facility], [address], [place], [place] [address]    Report Status 02/21/2018 FINAL  Final  Culture, blood (Routine X 2) w Reflex to ID Panel     Status: None   Collection Time: 02/16/18  5:50 AM  Result Value Ref Range Status   Specimen Description BLOOD RIGHT ARM  Final   Special Requests   Final    BOTTLES DRAWN AEROBIC AND ANAEROBIC Blood Culture adequate volume   Culture   Final    NO GROWTH 5 DAYS Performed at [facility], [place] [address], [place], [place] [address]    Report Status 02/21/2018 FINAL  Final         Radiology Studies: Dg Chest Port 1 View  Result Date: 02/22/2018 CLINICAL DATA:  Leukocytosis. EXAM: PORTABLE CHEST 1 VIEW COMPARISON:  None.  FINDINGS: Subtle asymmetric prominence of the LEFT perihilar shadow, suspicious for perihilar pneumonia and/or lymphadenopathy. Subtle opacity at the LEFT lung base, compatible with pneumonia or mild atelectasis. RIGHT lung is clear. Osseous structures about the chest are unremarkable. IMPRESSION: 1. Suspected LEFT perihilar pneumonia and/or lymphadenopathy. Consider chest CT for confirmation. 2. Subtle opacity at the LEFT lung base which could represent pneumonia, atelectasis and/or small pleural effusion. Electronically Signed   By: Franki Cabot M.D.   On: 02/22/2018 16:10        Scheduled Meds: . feeding supplement (ENSURE ENLIVE)  237 mL Oral TID BM  . Gerhardt's butt cream   Topical QID  . lipase/protease/amylase  24,000 Units Oral TID WC  . mouth rinse  15 mL Mouth Rinse BID  . mirtazapine  7.5 mg Oral QHS  . multivitamin with minerals  1 tablet Oral Daily  . pantoprazole  40 mg Oral Daily  . polyethylene glycol powder  1 Container Oral Once  . polyethylene glycol-electrolytes  4,000 mL Oral Once  . pravastatin  40 mg Oral QPM  . thiamine  100 mg Oral Daily   Continuous Infusions: . sodium chloride 10 mL/hr at 02/24/18 0000  . 0.9 % sodium chloride with kcl 75 mL/hr at 02/24/18 0258     LOS: 5 days   Time spent= 30 mins    Cyrene Gharibian Arsenio Loader, MD [facility] Pager [phone]   If 7PM-7AM, please contact night-coverage [email or web address] Password [facility] 02/24/2018, 11:34 AM

## 2018-02-24 NOTE — Progress Notes (Signed)
Events noted in the last 24 to 48 hours.  Laboratory data were reviewed as well with normalization of her platelets.  Her white cell count and differential is within normal range and hemoglobin remained relatively stable.  The hematological findings appear to be reactive in nature and recovered rather quickly as predicted.  No further hematology evaluation or input is needed at this time.  Please call with any questions regarding this pleasant woman.

## 2018-02-24 NOTE — Progress Notes (Signed)
Patient took several sips of miralax for bowel prep. Husband at bedside and encouraged patient to drink more but she refused.

## 2018-02-24 NOTE — H&P (View-Only) (Signed)
Patient ID: Maria Avila, female   DOB: [birth year], 67 y.o.   MRN: [id number]  Will do an EGD in addition to the colonoscopy tomorrow due to her anemia.

## 2018-02-24 NOTE — Progress Notes (Signed)
Patient ID: Maria Avila, female   DOB: Aug 29, 1950, 67 y.o.   MRN: [id number]  Will do an EGD in addition to the colonoscopy tomorrow due to her anemia.

## 2018-02-25 LAB — COMPREHENSIVE METABOLIC PANEL
ALBUMIN: 1.9 g/dL — AB (ref 3.5–5.0)
ALK PHOS: 110 U/L (ref 38–126)
ALT: 33 U/L (ref 0–44)
ANION GAP: 7 (ref 5–15)
AST: 28 U/L (ref 15–41)
BILIRUBIN TOTAL: 1.2 mg/dL (ref 0.3–1.2)
BUN: 5 mg/dL — ABNORMAL LOW (ref 8–23)
CALCIUM: 8.9 mg/dL (ref 8.9–10.3)
CO2: 20 mmol/L — ABNORMAL LOW (ref 22–32)
Chloride: 110 mmol/L (ref 98–111)
Creatinine, Ser: 0.68 mg/dL (ref 0.44–1.00)
GFR calc non Af Amer: >60 mL/min (ref >60)
GLUCOSE: 82 mg/dL (ref 70–99)
POTASSIUM: 4.3 mmol/L (ref 3.5–5.1)
SODIUM: 137 mmol/L (ref 135–145)
TOTAL PROTEIN: 5.1 g/dL — AB (ref 6.5–8.1)

## 2018-02-25 LAB — CBC
HCT: 34.6 % — ABNORMAL LOW (ref 36.0–46.0)
Hemoglobin: 12.1 g/dL (ref 12.0–15.0)
MCH: 31 pg (ref 26.0–34.0)
MCHC: 35 g/dL (ref 30.0–36.0)
MCV: 88.7 fL (ref 80.0–100.0)
NRBC: 0 % (ref 0.0–0.2)
PLATELETS: 192 10*3/uL (ref 150–400)
RBC: 3.9 MIL/uL (ref 3.87–5.11)
RDW: 15.1 % (ref 11.5–15.5)
WBC: 8.2 10*3/uL (ref 4.0–10.5)

## 2018-02-25 LAB — MAGNESIUM: Magnesium: 1.5 mg/dL — ABNORMAL LOW (ref 1.7–2.4)

## 2018-02-25 MED ORDER — SODIUM CHLORIDE 0.9 % IV BOLUS
500.0000 mL | Freq: Once | INTRAVENOUS | Status: AC
  Administered 2018-02-25: 500 mL via INTRAVENOUS

## 2018-02-25 NOTE — Social Work (Signed)
CSW continuing to follow for support with disposition when medically appropriate.  Jamilla Galli, MSW, LCSWA [facility] [phone]   

## 2018-02-25 NOTE — Progress Notes (Signed)
PROGRESS NOTE    Maria Avila  HYW:[id number] DOB: 11/12/1950 DOA: 02/16/2018 PCP: [name], MD   Brief Narrative:  67 year old with history of diabetes mellitus type 2, essential hypertension, weight loss, anorexia and failure to thrive comes back to the hospital again with complaints of abdominal pain and inability to tolerate much of oral diet.  This has been an ongoing issue for 6 months requiring her multiple hospital visits for dehydration.  She has been evaluated previously for pancytopenia by oncology and also by gastroenterology.  CT angiogram done earlier this month showed diffuse colonic wall thickening which is nonspecific.  Previous endoscopies has been unrevealing. GI plans on EGD and C scope.    Assessment & Plan:   Principal Problem:   Malnutrition of moderate degree Active Problems:   Weakness   Nausea vomiting and diarrhea   Diabetes (HCC)   Essential hypertension   Pancytopenia (HCC)   Hypotension  Failure to thrive and weight loss Moderate severe protein calorie malnutrition with dehydration. Persistent diarrhea - Previously patient has had an extensive work-up including HIDA scan which has been unrevealing.  Gastroenterology consulted.  Plans to perform endoscopy and colonoscopy this admission.  Plans were to get this done today with patient stools are still brown therefore has been postponed until tomorrow for now. -Aggressive bowel regimen, clear liquid diet and n.p.o. past midnight for tomorrow. -Recent HIDA scan was negative. - Gentle hydration-normal saline 75 cc/h  Pancytopenia, improving - Counts have greatly improved.  We will continue to closely monitor this.  Stage II gluteal ulcer -Local wound care . Depression  -Continue mirtazapine.   Essential hypertension - Continue home medication  Hyperlipidemia -Continue statin   DVT prophylaxis: SCDs Code Status: Full code Family Communication: Patient's husband at bedside Disposition  Plan: Maintain inpatient stay until she gets her endoscopy and colonoscopy done.  Unable to get this done today as her stools are still brown.  Consultants:   Gastroenterology  Oncology  Procedures:   None  Antimicrobials:   None   Subjective: Patient has poor oral intake and due to this she is not been able to drink her bowel regimen.  Stool still remains brown therefore colonoscopy and endoscopy push back till tomorrow. Appears slightly disoriented this morning but able to tell me her name.  Review of Systems Otherwise negative except as per HPI, including: General = no fevers, chills, dizziness, malaise, fatigue HEENT/EYES = negative for pain, redness, loss of vision, double vision, blurred vision, loss of hearing, sore throat, hoarseness, dysphagia Cardiovascular= negative for chest pain, palpitation, murmurs, lower extremity swelling Respiratory/lungs= negative for shortness of breath, cough, hemoptysis, wheezing, mucus production Gastrointestinal= negative for nausea, vomiting,, abdominal pain, melena, hematemesis Genitourinary= negative for Dysuria, Hematuria, Change in Urinary Frequency MSK = Negative for arthralgia, myalgias, Back Pain, Joint swelling  Neurology= Negative for headache, seizures, numbness, tingling  Psychiatry= Negative for anxiety, depression, suicidal and homocidal ideation Allergy/Immunology= Medication/Food allergy as listed  Skin= Negative for Rash, lesions, ulcers, itching    Objective: Vitals:   02/23/18 2039 02/24/18 0508 02/24/18 2205 02/25/18 0645  BP: (!) 119/93 (!) 125/96 106/79 114/88  Pulse: 91 92 99 100  Resp:  17 17 17   Temp:  98.7 F (37.1 C) 98 F (36.7 C) 97.9 F (36.6 C)  TempSrc:   Oral Oral  SpO2: 100% 100% 100% 100%  Weight:      Height:        Intake/Output Summary (Last 24 hours) at 02/25/2018 1144 Last  data filed at 02/25/2018 1119 Gross per 24 hour  Intake 950 ml  Output 750 ml  Net 200 ml   Filed Weights     02/16/18 0648  Weight: 48.7 kg    Examination:  Constitutional: NAD, calm, comfortable, elderly frail-appearing with poor dentition.  Generally weak Eyes: PERRL, lids and conjunctivae normal ENMT: Mucous membranes are moist. Posterior pharynx clear of any exudate or lesions.Normal dentition.  Neck: normal, supple, no masses, no thyromegaly Respiratory: clear to auscultation bilaterally, no wheezing, no crackles. Normal respiratory effort. No accessory muscle use.  Cardiovascular: Regular rate and rhythm, no murmurs / rubs / gallops. No extremity edema. 2+ pedal pulses. No carotid bruits.  Abdomen: no tenderness, no masses palpated. No hepatosplenomegaly. Bowel sounds positive.  Musculoskeletal: no clubbing / cyanosis. No joint deformity upper and lower extremities. Good ROM, no contractures. Normal muscle tone.  Skin: no rashes, lesions, ulcers. No induration Neurologic: CN 2-12 grossly intact. Sensation intact, DTR normal. Strength 4/5 in all 4.  Psychiatric:  Alert and oriented x 1-2 (baseline 3).     Data Reviewed:   CBC: Recent Labs  Lab 02/20/18 0237 02/22/18 0311 02/22/18 1043 02/23/18 1242 02/24/18 0211 02/25/18 0253  WBC 5.0 13.7*  --  7.6 7.7 8.2  NEUTROABS  --  9.6*  --  5.4  --   --   HGB 13.0 10.5*  --  11.5* 11.4* 12.1  HCT 38.1 30.5*  --  31.9* 32.7* 34.6*  MCV 90.9 88.7  --  87.9 88.9 88.7  PLT 56* 36* 45* 72* 110* 229   Basic Metabolic Panel: Recent Labs  Lab 02/19/18 0806 02/20/18 0237 02/22/18 0311 02/22/18 1043 02/23/18 0255 02/24/18 0211 02/25/18 0253  NA 139 138 139  --  138 138 137  K 3.4* 3.2* 2.8*  --  3.4* 4.6 4.3  CL 119* 116* 117*  --  116* 115* 110  CO2 19* 16* 18*  --  18* 20* 20*  GLUCOSE 78 76 65*  --  84 84 82  BUN <5* <5* <5*  --  5* 6* 5*  CREATININE 0.42* 0.54 0.58  --  0.63 0.69 0.68  CALCIUM 7.6* 8.2* 8.5*  --  8.6* 8.7* 8.9  MG 1.6*  --   --  1.4* 1.9 1.8 1.5*   GFR: Estimated Creatinine Clearance: 51.7 mL/min (by C-G  formula based on SCr of 0.68 mg/dL). Liver Function Tests: Recent Labs  Lab 02/23/18 0255 02/24/18 0211 02/25/18 0253  AST 31 26 28   ALT 33 31 33  ALKPHOS 92 92 110  BILITOT 1.0 1.0 1.2  PROT 4.8* 4.8* 5.1*  ALBUMIN 1.9* 1.9* 1.9*   No results for input(s): LIPASE, AMYLASE in the last 168 hours. No results for input(s): AMMONIA in the last 168 hours. Coagulation Profile: Recent Labs  Lab 02/22/18 1043  INR 1.29   Cardiac Enzymes: No results for input(s): CKTOTAL, CKMB, CKMBINDEX, TROPONINI in the last 168 hours. BNP (last 3 results) No results for input(s): PROBNP in the last 8760 hours. HbA1C: No results for input(s): HGBA1C in the last 72 hours. CBG: No results for input(s): GLUCAP in the last 168 hours. Lipid Profile: No results for input(s): CHOL, HDL, LDLCALC, TRIG, CHOLHDL, LDLDIRECT in the last 72 hours. Thyroid Function Tests: No results for input(s): TSH, T4TOTAL, FREET4, T3FREE, THYROIDAB in the last 72 hours. Anemia Panel: No results for input(s): VITAMINB12, FOLATE, FERRITIN, TIBC, IRON, RETICCTPCT in the last 72 hours. Sepsis Labs: No results for input(s): PROCALCITON,  LATICACIDVEN in the last 168 hours.  Recent Results (from the past 240 hour(s))  Culture, blood (Routine X 2) w Reflex to ID Panel     Status: None   Collection Time: 02/16/18  5:00 AM  Result Value Ref Range Status   Specimen Description BLOOD LEFT ARM  Final   Special Requests   Final    BOTTLES DRAWN AEROBIC AND ANAEROBIC Blood Culture adequate volume   Culture   Final    NO GROWTH 5 DAYS Performed at [facility], [address], [place], [place] [address]    Report Status 02/21/2018 FINAL  Final  Culture, blood (Routine X 2) w Reflex to ID Panel     Status: None   Collection Time: 02/16/18  5:50 AM  Result Value Ref Range Status   Specimen Description BLOOD RIGHT ARM  Final   Special Requests   Final    BOTTLES DRAWN AEROBIC AND ANAEROBIC Blood Culture adequate volume    Culture   Final    NO GROWTH 5 DAYS Performed at [facility], [place] [address], [place], [place] [address]    Report Status 02/21/2018 FINAL  Final         Radiology Studies: No results found.      Scheduled Meds: . feeding supplement (ENSURE ENLIVE)  237 mL Oral TID BM  . Gerhardt's butt cream   Topical QID  . lipase/protease/amylase  24,000 Units Oral TID WC  . mouth rinse  15 mL Mouth Rinse BID  . mirtazapine  7.5 mg Oral QHS  . multivitamin with minerals  1 tablet Oral Daily  . pantoprazole  40 mg Oral Daily  . polyethylene glycol powder  1 Container Oral Once  . pravastatin  40 mg Oral QPM  . thiamine  100 mg Oral Daily   Continuous Infusions: . sodium chloride 10 mL/hr at 02/24/18 0000  . 0.9 % sodium chloride with kcl 75 mL/hr at 02/25/18 0544     LOS: 6 days   Time spent= 25 mins    Ileen Kahre Arsenio Loader, MD [facility] Pager [phone]   If 7PM-7AM, please contact night-coverage [email or web address] Password [email or web address] 02/25/2018, 11:44 AM

## 2018-02-25 NOTE — Progress Notes (Signed)
Physical Therapy Treatment Patient Details Name: Maria Avila MRN: [id number] DOB: [birth year] Today's Date: 02/25/2018    History of Present Illness Maria Avila is a 67 y.o. female with a past medical history significant for NIDDM, HTN who presents with several months weight loss, malaise and weakness, now with periumbilical pain and vomiting.    PT Comments    Patient seen for mobility progression. Pt continues to present with cognitive deficits and oriented to person only. Pt requires mod/max A for supine to sit/sit to supine and min/mod A +2 for safety with sit to stand transfers using Stedy standing frame. Continue to progress as tolerated with anticipated d/c to SNF for further skilled PT services.     Follow Up Recommendations  SNF;Supervision/Assistance - 24 hour     Equipment Recommendations  Other (comment)(TBA)    Recommendations for Other Services Speech consult;Rehab consult     Precautions / Restrictions Precautions Precautions: Fall Precaution Comments: frequent stools    Mobility  Bed Mobility Overal bed mobility: Needs Assistance Bed Mobility: Rolling;Supine to Sit Rolling: Min assist   Supine to sit: HOB elevated;Mod assist Sit to supine: Max assist;+2 for safety/equipment   General bed mobility comments: cues for sequencing and assistance for rolling and bring R hip to EOB and elevate trunk into sitting from supine   Transfers Overall transfer level: Needs assistance   Transfers: Sit to/from Stand Sit to Stand: Mod assist;Min assist;+2 safety/equipment         General transfer comment: max cues for positioning and assistance to power up into standing and facilitation for hip extension  Ambulation/Gait                 Stairs             Wheelchair Mobility    Modified Rankin (Stroke Patients Only)       Balance Overall balance assessment: Needs assistance Sitting-balance support: Bilateral upper extremity supported;Feet  supported Sitting balance-Leahy Scale: Poor   Postural control: Posterior lean Standing balance support: Bilateral upper extremity supported Standing balance-Leahy Scale: Poor                              Cognition Arousal/Alertness: Awake/alert Behavior During Therapy: Flat affect Overall Cognitive Status: Impaired/Different from baseline Area of Impairment: Memory;Safety/judgement;Orientation;Attention;Following commands;Problem solving                 Orientation Level: Disoriented to;Time;Situation;Place Current Attention Level: Sustained   Following Commands: Follows one step commands inconsistently;Follows one step commands with increased time     Problem Solving: Slow processing;Difficulty sequencing;Requires verbal cues;Requires tactile cues General Comments: pt tangential      Exercises      General Comments General comments (skin integrity, edema, etc.): pt incontinent of stool upon standing second trial      Pertinent Vitals/Pain Pain Assessment: Faces Faces Pain Scale: No hurt Pain Intervention(s): Monitored during session    Home Living                      Prior Function            PT Goals (current goals can now be found in the care plan section) Progress towards PT goals: Progressing toward goals    Frequency    Min 2X/week      PT Plan Current plan remains appropriate    Co-evaluation  AM-PAC PT "6 Clicks" Daily Activity  Outcome Measure  Difficulty turning over in bed (including adjusting bedclothes, sheets and blankets)?: Unable Difficulty moving from lying on back to sitting on the side of the bed? : Unable Difficulty sitting down on and standing up from a chair with arms (e.g., wheelchair, bedside commode, etc,.)?: Unable Help needed moving to and from a bed to chair (including a wheelchair)?: A Lot Help needed walking in hospital room?: A Lot Help needed climbing 3-5 steps with a  railing? : Total 6 Click Score: 8    End of Session Equipment Utilized During Treatment: Gait belt Activity Tolerance: Patient tolerated treatment well Patient left: with call bell/phone within reach;with family/visitor present;in bed;with bed alarm set Nurse Communication: Mobility status PT Visit Diagnosis: Muscle weakness (generalized) (M62.81);Adult, failure to thrive (R62.7);Other abnormalities of gait and mobility (R26.89);History of falling (Z91.81) Hemiplegia - Right/Left: Left Hemiplegia - dominant/non-dominant: Non-dominant Hemiplegia - caused by: Unspecified Pain - part of body: (abdomen)     Time: 6237-6283 PT Time Calculation (min) (ACUTE ONLY): 30 min  Charges:  $Therapeutic Activity: 23-37 mins                     Earney Navy, PTA Acute Rehabilitation Services Pager: [phone] Office: [phone]     Darliss Cheney 02/25/2018, 4:27 PM

## 2018-02-26 ENCOUNTER — Encounter (HOSPITAL_COMMUNITY): Payer: Self-pay | Admitting: Anesthesiology

## 2018-02-26 ENCOUNTER — Encounter (HOSPITAL_COMMUNITY)
Admission: EM | Disposition: A | Discharge status: Skilled Nursing Facility | Payer: Self-pay | Source: Home / Self Care | Attending: Internal Medicine

## 2018-02-26 ENCOUNTER — Inpatient Hospital Stay (HOSPITAL_COMMUNITY): Payer: Medicare Other | Admitting: Anesthesiology

## 2018-02-26 DIAGNOSIS — R197 Diarrhea, unspecified: Secondary | ICD-10-CM | POA: Diagnosis present

## 2018-02-26 HISTORY — PX: COLONOSCOPY WITH PROPOFOL: SHX5780

## 2018-02-26 HISTORY — PX: ESOPHAGOGASTRODUODENOSCOPY (EGD) WITH PROPOFOL: SHX5813

## 2018-02-26 HISTORY — PX: BIOPSY: SHX5522

## 2018-02-26 LAB — CBC
HEMATOCRIT: 34.1 % — AB (ref 36.0–46.0)
HEMOGLOBIN: 12 g/dL (ref 12.0–15.0)
MCH: 31.1 pg (ref 26.0–34.0)
MCHC: 35.2 g/dL (ref 30.0–36.0)
MCV: 88.3 fL (ref 80.0–100.0)
Platelets: 263 10*3/uL (ref 150–400)
RBC: 3.86 MIL/uL — AB (ref 3.87–5.11)
RDW: 14.9 % (ref 11.5–15.5)
WBC: 8.2 10*3/uL (ref 4.0–10.5)
nRBC: 0 % (ref 0.0–0.2)

## 2018-02-26 LAB — COMPREHENSIVE METABOLIC PANEL
ALBUMIN: 1.9 g/dL — AB (ref 3.5–5.0)
ALT: 32 U/L (ref 0–44)
ANION GAP: 10 (ref 5–15)
AST: 29 U/L (ref 15–41)
Alkaline Phosphatase: 117 U/L (ref 38–126)
BUN: <5 mg/dL — ABNORMAL LOW (ref 8–23)
CHLORIDE: 110 mmol/L (ref 98–111)
CO2: 21 mmol/L — ABNORMAL LOW (ref 22–32)
Calcium: 9.1 mg/dL (ref 8.9–10.3)
Creatinine, Ser: 0.59 mg/dL (ref 0.44–1.00)
GFR calc Af Amer: >60 mL/min (ref >60)
GLUCOSE: 69 mg/dL — AB (ref 70–99)
POTASSIUM: 3.8 mmol/L (ref 3.5–5.1)
Sodium: 141 mmol/L (ref 135–145)
TOTAL PROTEIN: 4.9 g/dL — AB (ref 6.5–8.1)
Total Bilirubin: 1.4 mg/dL — ABNORMAL HIGH (ref 0.3–1.2)

## 2018-02-26 LAB — MAGNESIUM: MAGNESIUM: 1.6 mg/dL — AB (ref 1.7–2.4)

## 2018-02-26 SURGERY — COLONOSCOPY WITH PROPOFOL
Anesthesia: Monitor Anesthesia Care

## 2018-02-26 MED ORDER — PROPOFOL 10 MG/ML IV BOLUS
INTRAVENOUS | Status: DC | PRN
  Administered 2018-02-26: 20 mg via INTRAVENOUS
  Administered 2018-02-26: 40 mg via INTRAVENOUS

## 2018-02-26 MED ORDER — PHENYLEPHRINE 40 MCG/ML (10ML) SYRINGE FOR IV PUSH (FOR BLOOD PRESSURE SUPPORT)
PREFILLED_SYRINGE | INTRAVENOUS | Status: DC | PRN
  Administered 2018-02-26 (×2): 80 ug via INTRAVENOUS
  Administered 2018-02-26: 120 ug via INTRAVENOUS
  Administered 2018-02-26 (×2): 80 ug via INTRAVENOUS
  Administered 2018-02-26 (×2): 120 ug via INTRAVENOUS
  Administered 2018-02-26 (×3): 80 ug via INTRAVENOUS

## 2018-02-26 MED ORDER — LACTATED RINGERS IV SOLN
INTRAVENOUS | Status: DC | PRN
  Administered 2018-02-26: 09:00:00 via INTRAVENOUS

## 2018-02-26 MED ORDER — ONDANSETRON HCL 4 MG/2ML IJ SOLN: 4.0000 mg | Freq: Once | INTRAMUSCULAR | Status: DC | PRN

## 2018-02-26 MED ORDER — PROPOFOL 500 MG/50ML IV EMUL
INTRAVENOUS | Status: DC | PRN
  Administered 2018-02-26: 50 ug/kg/min via INTRAVENOUS

## 2018-02-26 MED ORDER — LIDOCAINE 2% (20 MG/ML) 5 ML SYRINGE
INTRAMUSCULAR | Status: DC | PRN
  Administered 2018-02-26: 40 mg via INTRAVENOUS

## 2018-02-26 MED ORDER — EPHEDRINE SULFATE-NACL 50-0.9 MG/10ML-% IV SOSY
PREFILLED_SYRINGE | INTRAVENOUS | Status: DC | PRN
  Administered 2018-02-26: 10 mg via INTRAVENOUS

## 2018-02-26 SURGICAL SUPPLY — 25 items

## 2018-02-26 NOTE — Anesthesia Postprocedure Evaluation (Signed)
Anesthesia Post Note  Patient: Maria Avila  Procedure(s) Performed: COLONOSCOPY WITH PROPOFOL (N/A ) ESOPHAGOGASTRODUODENOSCOPY (EGD) WITH PROPOFOL (N/A ) BIOPSY     Patient location during evaluation: PACU Anesthesia Type: MAC Level of consciousness: awake and alert and oriented Pain management: pain level controlled Vital Signs Assessment: post-procedure vital signs reviewed and stable Respiratory status: spontaneous breathing, nonlabored ventilation and respiratory function stable Cardiovascular status: stable and blood pressure returned to baseline Postop Assessment: no apparent nausea or vomiting Anesthetic complications: no Comments: BP inaccurate due to shivering.    Last Vitals:  Vitals:   02/26/18 0945 02/26/18 0950  BP: (!) 75/57   Pulse: 80 83  Resp: 19 17  Temp:  (!) 35.3 C  SpO2: 100% 100%    Last Pain:  Vitals:   02/26/18 0950  TempSrc: Skin  PainSc: 0-No pain                 Hermilo Dutter A.

## 2018-02-26 NOTE — Brief Op Note (Signed)
Large area of skin breakdown concerning for decubitus ulcer and needs a wound care consult. EGD/colonoscopy completed and details/recs in endopro (Procedure tab). No definite source of anemia seen. No further GI procedures planned at this time. Advance diet. F/U with Dr. Therisa Avila as an outpt. Path will be f/u as an outpt. Will sign off. Call if questions.

## 2018-02-26 NOTE — Interval H&P Note (Signed)
History and Physical Interval Note:  02/26/2018 8:36 AM  Maria Avila  has presented today for surgery, with the diagnosis of Diarrhea; Anemia  The various methods of treatment have been discussed with the patient and family. After consideration of risks, benefits and other options for treatment, the patient has consented to  Procedure(s): COLONOSCOPY WITH PROPOFOL (N/A) ESOPHAGOGASTRODUODENOSCOPY (EGD) WITH PROPOFOL (N/A) as a surgical intervention .  The patient's history has been reviewed, patient examined, no change in status, stable for surgery.  I have reviewed the patient's chart and labs.  Questions were answered to the patient's satisfaction.     [name]

## 2018-02-26 NOTE — Transfer of Care (Signed)
Immediate Anesthesia Transfer of Care Note  Patient: Saraia E Martinique  Procedure(s) Performed: COLONOSCOPY WITH PROPOFOL (N/A ) ESOPHAGOGASTRODUODENOSCOPY (EGD) WITH PROPOFOL (N/A ) BIOPSY  Patient Location: PACU and Endoscopy Unit  Anesthesia Type:MAC  Level of Consciousness: awake, alert  and patient cooperative  Airway & Oxygen Therapy: Patient Spontanous Breathing and Patient connected to nasal cannula oxygen  Post-op Assessment: Report given to RN and Post -op Vital signs reviewed and stable  Post vital signs: Reviewed and stable  Last Vitals:  Vitals Value Taken Time  BP 122/102 02/26/2018  9:36 AM  Temp    Pulse 87 02/26/2018  9:35 AM  Resp 16 02/26/2018  9:36 AM  SpO2 89 % 02/26/2018  9:35 AM  Vitals shown include unvalidated device data.  Last Pain:  Vitals:   02/26/18 0828  TempSrc: Oral  PainSc: 0-No pain      Patients Stated Pain Goal: 2 (14/97/02 6378)  Complications: No apparent anesthesia complications

## 2018-02-26 NOTE — Progress Notes (Signed)
PROGRESS NOTE    Maria Avila  VOZ:[id number] DOB: 03/27/1951 DOA: 02/16/2018 PCP: [name], MD   Brief Narrative:  67 year old with history of diabetes mellitus type 2, essential hypertension, weight loss, anorexia and failure to thrive comes back to the hospital again with complaints of abdominal pain and inability to tolerate much of oral diet.  This has been an ongoing issue for 6 months requiring her multiple hospital visits for dehydration.  She has been evaluated previously for pancytopenia by oncology and also by gastroenterology.  CT angiogram done earlier this month showed diffuse colonic wall thickening which is nonspecific.  Previous endoscopies has been unrevealing.  Patient undergoing endoscopy and colonoscopy today.   Assessment & Plan:   Principal Problem:   Malnutrition of moderate degree Active Problems:   Weakness   Nausea vomiting and diarrhea   Diabetes (HCC)   Essential hypertension   Pancytopenia (HCC)   Hypotension   Diarrhea  Failure to thrive and weight loss, slow to improve Moderate severe protein calorie malnutrition with dehydration. Persistent diarrhea - Previously patient has had an extensive work-up including HIDA scan which has been unrevealing.  Gastroenterology consulted.  Plans to perform endoscopy and colonoscopy this admission.  - Endoscopy 10/25- normal esophagus, acute gastritis -Colonoscopy 10/25- diverticulosis seen throughout, congested, erythematous and vascular pattern decreased mucosa in the colon.  Biopsies taken, internal hemorrhoids. -Gastroenterology recommends-high-fiber diet, follow-up outpatient with Dr. Therisa Doyne. -Recent HIDA scan was negative. - Gentle hydration-normal saline 75 cc/h  Pancytopenia, improving - Continues to improve daily.  Stage II gluteal ulcer -Local wound care, patient evaluated by wound care on 02/21/2018 . Depression  -Continue mirtazapine.   Essential hypertension - Continue home  medication  Hyperlipidemia -Continue statin   DVT prophylaxis: SCDs Code Status: Full code Family Communication: Patient's husband at bedside Disposition Plan: We will monitor patient after the procedure today.  We will make sure she oral tolerates oral diet appropriately.  We will discharge her in next 24-48 hours.  Consultants:   Gastroenterology  Oncology  Procedures:  - Endoscopy 10/25- normal esophagus, acute gastritis -Colonoscopy 10/25- diverticulosis seen throughout, congested, erythematous and vascular pattern decreased mucosa in the colon.  Biopsies taken, internal hemorrhoids.  Antimicrobials:   None   Subjective: Patient is currently groggy and tired after her procedure.  No new complaints.  Review of Systems Otherwise negative except as per HPI, including: General = no fevers, chills, dizziness, malaise, fatigue HEENT/EYES = negative for pain, redness, loss of vision, double vision, blurred vision, loss of hearing, sore throat, hoarseness, dysphagia Cardiovascular= negative for chest pain, palpitation, murmurs, lower extremity swelling Respiratory/lungs= negative for shortness of breath, cough, hemoptysis, wheezing, mucus production Gastrointestinal= negative for nausea, vomiting,, abdominal pain, melena, hematemesis Genitourinary= negative for Dysuria, Hematuria, Change in Urinary Frequency MSK = Negative for arthralgia, myalgias, Back Pain, Joint swelling  Neurology= Negative for headache, seizures, numbness, tingling  Psychiatry= Negative for anxiety, depression, suicidal and homocidal ideation Allergy/Immunology= Medication/Food allergy as listed  Skin= Negative for Rash, lesions, ulcers, itching     Objective: Vitals:   02/26/18 0936 02/26/18 0945 02/26/18 0950 02/26/18 1033  BP: (!) 122/102 (!) 75/57  113/84  Pulse: 87 80 83 94  Resp: 15 19 17 17   Temp:   (!) 95.5 F (35.3 C)   TempSrc:   Skin   SpO2: 100% 100% 100% 100%  Weight:       Height:        Intake/Output Summary (Last 24 hours) at 02/26/2018 1053  Last data filed at 02/26/2018 0937 Gross per 24 hour  Intake 2554.79 ml  Output 1800 ml  Net 754.79 ml   Filed Weights   02/16/18 0648  Weight: 48.7 kg    Examination:  Constitutional: NAD, calm, comfortable, elderly frail.  Sleeping. Eyes: PERRL, lids and conjunctivae normal ENMT: Mucous membranes are moist. Posterior pharynx clear of any exudate or lesions.Normal dentition.  Poor dentition Neck: normal, supple, no masses, no thyromegaly Respiratory: clear to auscultation bilaterally, no wheezing, no crackles. Normal respiratory effort. No accessory muscle use.  Cardiovascular: Regular rate and rhythm, no murmurs / rubs / gallops. No extremity edema. 2+ pedal pulses. No carotid bruits.  Abdomen: no tenderness, no masses palpated. No hepatosplenomegaly. Bowel sounds positive.  Musculoskeletal: no clubbing / cyanosis. No joint deformity upper and lower extremities. Good ROM, no contractures. Normal muscle tone.  Skin: no rashes, lesions, ulcers. No induration Neurologic: CN 2-12 grossly intact. Sensation intact, DTR normal. Strength 4/5 in all 4.  Psychiatric:  Alert and oriented x 2.      Data Reviewed:   CBC: Recent Labs  Lab 02/22/18 0311 02/22/18 1043 02/23/18 1242 02/24/18 0211 02/25/18 0253 02/26/18 0519  WBC 13.7*  --  7.6 7.7 8.2 8.2  NEUTROABS 9.6*  --  5.4  --   --   --   HGB 10.5*  --  11.5* 11.4* 12.1 12.0  HCT 30.5*  --  31.9* 32.7* 34.6* 34.1*  MCV 88.7  --  87.9 88.9 88.7 88.3  PLT 36* 45* 72* 110* 192 696   Basic Metabolic Panel: Recent Labs  Lab 02/22/18 0311 02/22/18 1043 02/23/18 0255 02/24/18 0211 02/25/18 0253 02/26/18 0519  NA 139  --  138 138 137 141  K 2.8*  --  3.4* 4.6 4.3 3.8  CL 117*  --  116* 115* 110 110  CO2 18*  --  18* 20* 20* 21*  GLUCOSE 65*  --  84 84 82 69*  BUN <5*  --  5* 6* 5* <5*  CREATININE 0.58  --  0.63 0.69 0.68 0.59  CALCIUM 8.5*  --   8.6* 8.7* 8.9 9.1  MG  --  1.4* 1.9 1.8 1.5* 1.6*   GFR: Estimated Creatinine Clearance: 51.7 mL/min (by C-G formula based on SCr of 0.59 mg/dL). Liver Function Tests: Recent Labs  Lab 02/23/18 0255 02/24/18 0211 02/25/18 0253 02/26/18 0519  AST 31 26 28 29   ALT 33 31 33 32  ALKPHOS 92 92 110 117  BILITOT 1.0 1.0 1.2 1.4*  PROT 4.8* 4.8* 5.1* 4.9*  ALBUMIN 1.9* 1.9* 1.9* 1.9*   No results for input(s): LIPASE, AMYLASE in the last 168 hours. No results for input(s): AMMONIA in the last 168 hours. Coagulation Profile: Recent Labs  Lab 02/22/18 1043  INR 1.29   Cardiac Enzymes: No results for input(s): CKTOTAL, CKMB, CKMBINDEX, TROPONINI in the last 168 hours. BNP (last 3 results) No results for input(s): PROBNP in the last 8760 hours. HbA1C: No results for input(s): HGBA1C in the last 72 hours. CBG: No results for input(s): GLUCAP in the last 168 hours. Lipid Profile: No results for input(s): CHOL, HDL, LDLCALC, TRIG, CHOLHDL, LDLDIRECT in the last 72 hours. Thyroid Function Tests: No results for input(s): TSH, T4TOTAL, FREET4, T3FREE, THYROIDAB in the last 72 hours. Anemia Panel: No results for input(s): VITAMINB12, FOLATE, FERRITIN, TIBC, IRON, RETICCTPCT in the last 72 hours. Sepsis Labs: No results for input(s): PROCALCITON, LATICACIDVEN in the last 168 hours.  No results  found for this or any previous visit (from the past 240 hour(s)).       Radiology Studies: No results found.      Scheduled Meds: . feeding supplement (ENSURE ENLIVE)  237 mL Oral TID BM  . Gerhardt's butt cream   Topical QID  . lipase/protease/amylase  24,000 Units Oral TID WC  . mouth rinse  15 mL Mouth Rinse BID  . mirtazapine  7.5 mg Oral QHS  . multivitamin with minerals  1 tablet Oral Daily  . pantoprazole  40 mg Oral Daily  . polyethylene glycol powder  1 Container Oral Once  . pravastatin  40 mg Oral QPM  . thiamine  100 mg Oral Daily   Continuous Infusions: . sodium  chloride 10 mL/hr at 02/24/18 0000  . 0.9 % sodium chloride with kcl 75 mL/hr at 02/25/18 1940     LOS: 7 days   Time spent= 24 mins    Lalena Salas Arsenio Loader, MD [facility] Pager [phone]   If 7PM-7AM, please contact night-coverage [email or web address] Password [facility] 02/26/2018, 10:53 AM

## 2018-02-26 NOTE — Op Note (Signed)
[facility] Patient Name: Maria Avila Procedure Date : 02/26/2018 MRN: [id number] Attending MD: [name] , MD Date of Birth: 10-11-1950 CSN: [id number] Age: 67 Admit Type: Inpatient Procedure:                Colonoscopy Indications:              Last colonoscopy: date unknown, Diarrhea, Iron                            deficiency anemia Providers:                [name], MD, Vista Lawman, RN, Elspeth Cho Tech., Technician, [employer], CRNA Referring MD:             hospital team Medicines:                Propofol per Anesthesia, Monitored Anesthesia Care Complications:            No immediate complications. Estimated Blood Loss:     Estimated blood loss was minimal. Procedure:                Pre-Anesthesia Assessment:                           - Prior to the procedure, a History and Physical                            was performed, and patient medications and                            allergies were reviewed. The patient's tolerance of                            previous anesthesia was also reviewed. The risks                            and benefits of the procedure and the sedation                            options and risks were discussed with the patient.                            All questions were answered, and informed consent                            was obtained. Prior Anticoagulants: The patient has                            taken no previous anticoagulant or antiplatelet                            agents. ASA Grade Assessment: II - A patient with  mild systemic disease. After reviewing the risks                            and benefits, the patient was deemed in                            satisfactory condition to undergo the procedure.                           After obtaining informed consent, the colonoscope                            was passed under direct vision.  Throughout the                            procedure, the patient's blood pressure, pulse, and                            oxygen saturations were monitored continuously. The                            [id number]-[id number] ([id number]) peds colon was introduced                            through the anus and advanced to the the cecum,                            identified by appendiceal orifice and ileocecal                            valve. The colonoscopy was performed without                            difficulty. The patient tolerated the procedure                            well. The quality of the bowel preparation was                            fair. The ileocecal valve, appendiceal orifice, and                            rectum were photographed. Scope In: 9:07:09 AM Scope Out: 9:24:11 AM Scope Withdrawal Time: 0 hours 11 minutes 26 seconds  Total Procedure Duration: 0 hours 17 minutes 2 seconds  Findings:      Scattered small and large-mouthed diverticula were found in the       descending colon and ascending colon.      Multiple small and large-mouthed diverticula were found in the sigmoid       colon.      A patchy area of moderately congested, erythematous and       vascular-pattern-decreased mucosa was found in the entire colon.       Biopsies were taken with a cold forceps for histology. Estimated blood  loss was minimal.      Internal hemorrhoids were found during retroflexion. The hemorrhoids       were small and Grade I (internal hemorrhoids that do not prolapse).      The perianal exam findings include a decubitus ulceration. Impression:               - Preparation of the colon was fair.                           - Diverticulosis in the descending colon and in the                            ascending colon.                           - Diverticulosis in the sigmoid colon.                           - Congested, erythematous and                             vascular-pattern-decreased mucosa in the entire                            examined colon. Biopsied.                           - Internal hemorrhoids.                           - Decubitus ulceration found on perianal exam. Recommendation:           - Await pathology results.                           - Repeat colonoscopy for surveillance based on                            pathology results.                           - High fiber diet. Procedure Code(s):        --- Professional ---                           [id number], Colonoscopy, flexible; with biopsy, single                            or multiple Diagnosis Code(s):        --- Professional ---                           D50.9, Iron deficiency anemia, unspecified                           R19.7, Diarrhea, unspecified                           K64.0, First degree  hemorrhoids                           K63.89, Other specified diseases of intestine                           L89.899, Pressure ulcer of other site, unspecified                            stage                           K57.30, Diverticulosis of large intestine without                            perforation or abscess without bleeding CPT copyright 2018 American [employer] Association. All rights reserved. The codes documented in this report are preliminary and upon coder review may  be revised to meet current compliance requirements. [name], MD 02/26/2018 9:41:19 AM This report has been signed electronically. Number of Addenda: 0

## 2018-02-26 NOTE — Progress Notes (Signed)
2200 Patient blood pressure 86/56 tonight.Text paged NP X. Blount. New order received for 500 cc normal saline bolus and given.

## 2018-02-26 NOTE — Op Note (Signed)
[facility] Patient Name: Maria Avila Procedure Date : 02/26/2018 MRN: [id number] Attending MD: [name] , MD Date of Birth: 05-30-1950 CSN: [id number] Age: 67 Admit Type: Inpatient Procedure:                Upper GI endoscopy Indications:              Iron deficiency anemia Providers:                [name], MD, Vista Lawman, RN, Elspeth Cho Tech., Technician, [employer], CRNA Referring MD:             hospital team Medicines:                Propofol per Anesthesia, Monitored Anesthesia Care Complications:            No immediate complications. Estimated Blood Loss:     Estimated blood loss: none. Procedure:                Pre-Anesthesia Assessment:                           - Prior to the procedure, a History and Physical                            was performed, and patient medications and                            allergies were reviewed. The patient's tolerance of                            previous anesthesia was also reviewed. The risks                            and benefits of the procedure and the sedation                            options and risks were discussed with the patient.                            All questions were answered, and informed consent                            was obtained. Prior Anticoagulants: The patient has                            taken no previous anticoagulant or antiplatelet                            agents. ASA Grade Assessment: II - A patient with                            mild systemic disease. After reviewing the risks  and benefits, the patient was deemed in                            satisfactory condition to undergo the procedure.                           After obtaining informed consent, the endoscope was                            passed under direct vision. Throughout the                            procedure, the patient's blood  pressure, pulse, and                            oxygen saturations were monitored continuously. The                            [id number]-[id number] ([id number]) Olympus adult EGD was introduced                            through the mouth, and advanced to the second part                            of duodenum. The upper GI endoscopy was                            accomplished without difficulty. The patient                            tolerated the procedure fairly well. Scope In: Scope Out: Findings:      The examined esophagus was normal.      The Z-line was regular and was found 40 cm from the incisors.      Patchy moderate inflammation characterized by congestion (edema),       erosions and erythema was found in the stomach.      A small hiatal hernia was present.      The examined duodenum was normal. Impression:               - Normal esophagus.                           - Z-line regular, 40 cm from the incisors.                           - Acute gastritis.                           - Small hiatal hernia.                           - Normal examined duodenum.                           - No specimens collected. Recommendation:           -  Patient has a contact number available for                            emergencies. The signs and symptoms of potential                            delayed complications were discussed with the                            patient. Return to normal activities tomorrow.                            Written discharge instructions were provided to the                            patient.                           - Follow an antireflux regimen. Procedure Code(s):        --- Professional ---                           [phone], Esophagogastroduodenoscopy, flexible,                            transoral; diagnostic, including collection of                            specimen(s) by brushing or washing, when performed                            (separate procedure) Diagnosis  Code(s):        --- Professional ---                           D50.9, Iron deficiency anemia, unspecified                           K29.00, Acute gastritis without bleeding                           K44.9, Diaphragmatic hernia without obstruction or                            gangrene CPT copyright 2018 American Medical Association. All rights reserved. The codes documented in this report are preliminary and upon coder review may  be revised to meet current compliance requirements. [name], MD 02/26/2018 9:35:40 AM This report has been signed electronically. Number of Addenda: 0

## 2018-02-26 NOTE — Anesthesia Preprocedure Evaluation (Signed)
Anesthesia Evaluation  Patient identified by MRN, date of birth, ID band Patient awake    Reviewed: Allergy & Precautions, NPO status , Patient's Chart, lab work & pertinent test results  Airway Mallampati: II  TM Distance: >3 FB Neck ROM: Full    Dental no notable dental hx.    Pulmonary former smoker,    Pulmonary exam normal        Cardiovascular hypertension,  Rhythm:Regular Rate:Normal     Neuro/Psych negative neurological ROS  negative psych ROS   GI/Hepatic Neg liver ROS, Diarrhea Wt. loss   Endo/Other  diabetes, Well ControlledMalnutrition  Renal/GU negative Renal ROS  negative genitourinary   Musculoskeletal Weakness    Abdominal   Peds  Hematology  (+) anemia , Pancytopenia   Anesthesia Other Findings   Reproductive/Obstetrics negative OB ROS                             Anesthesia Physical Anesthesia Plan  ASA: II  Anesthesia Plan: MAC   Post-op Pain Management:    Induction:   PONV Risk Score and Plan: Ondansetron and Propofol infusion  Airway Management Planned: Natural Airway and Nasal Cannula  Additional Equipment:   Intra-op Plan:   Post-operative Plan:   Informed Consent: I have reviewed the patients History and Physical, chart, labs and discussed the procedure including the risks, benefits and alternatives for the proposed anesthesia with the patient or authorized representative who has indicated his/her understanding and acceptance.     Plan Discussed with: CRNA and Surgeon  Anesthesia Plan Comments:         Anesthesia Quick Evaluation

## 2018-02-26 NOTE — Anesthesia Procedure Notes (Signed)
Procedure Name: MAC Date/Time: 02/26/2018 8:52 AM Performed by: Renato Shin, CRNA Pre-anesthesia Checklist: Patient identified, Emergency Drugs available, Suction available and Patient being monitored Patient Re-evaluated:Patient Re-evaluated prior to induction Oxygen Delivery Method: Nasal cannula Preoxygenation: Pre-oxygenation with 100% oxygen Induction Type: IV induction Placement Confirmation: positive ETCO2 and CO2 detector Dental Injury: Teeth and Oropharynx as per pre-operative assessment

## 2018-02-27 DIAGNOSIS — A4152 Sepsis due to Pseudomonas: Secondary | ICD-10-CM | POA: Diagnosis not present

## 2018-02-27 DIAGNOSIS — A419 Sepsis, unspecified organism: Secondary | ICD-10-CM | POA: Diagnosis not present

## 2018-02-27 DIAGNOSIS — R404 Transient alteration of awareness: Secondary | ICD-10-CM | POA: Diagnosis not present

## 2018-02-27 DIAGNOSIS — R4020 Unspecified coma: Secondary | ICD-10-CM | POA: Diagnosis not present

## 2018-02-27 DIAGNOSIS — R2681 Unsteadiness on feet: Secondary | ICD-10-CM | POA: Diagnosis not present

## 2018-02-27 DIAGNOSIS — Z7401 Bed confinement status: Secondary | ICD-10-CM | POA: Diagnosis not present

## 2018-02-27 DIAGNOSIS — Z7189 Other specified counseling: Secondary | ICD-10-CM | POA: Diagnosis not present

## 2018-02-27 DIAGNOSIS — J690 Pneumonitis due to inhalation of food and vomit: Secondary | ICD-10-CM | POA: Diagnosis not present

## 2018-02-27 DIAGNOSIS — I82453 Acute embolism and thrombosis of peroneal vein, bilateral: Secondary | ICD-10-CM | POA: Diagnosis not present

## 2018-02-27 DIAGNOSIS — Z515 Encounter for palliative care: Secondary | ICD-10-CM | POA: Diagnosis not present

## 2018-02-27 DIAGNOSIS — R6520 Severe sepsis without septic shock: Secondary | ICD-10-CM | POA: Diagnosis not present

## 2018-02-27 DIAGNOSIS — D61818 Other pancytopenia: Secondary | ICD-10-CM | POA: Diagnosis not present

## 2018-02-27 DIAGNOSIS — B952 Enterococcus as the cause of diseases classified elsewhere: Secondary | ICD-10-CM | POA: Diagnosis not present

## 2018-02-27 DIAGNOSIS — I82443 Acute embolism and thrombosis of tibial vein, bilateral: Secondary | ICD-10-CM | POA: Diagnosis not present

## 2018-02-27 DIAGNOSIS — J869 Pyothorax without fistula: Secondary | ICD-10-CM | POA: Diagnosis not present

## 2018-02-27 DIAGNOSIS — M2550 Pain in unspecified joint: Secondary | ICD-10-CM | POA: Diagnosis not present

## 2018-02-27 DIAGNOSIS — E119 Type 2 diabetes mellitus without complications: Secondary | ICD-10-CM | POA: Diagnosis not present

## 2018-02-27 DIAGNOSIS — E440 Moderate protein-calorie malnutrition: Secondary | ICD-10-CM | POA: Diagnosis not present

## 2018-02-27 DIAGNOSIS — R6521 Severe sepsis with septic shock: Secondary | ICD-10-CM | POA: Diagnosis not present

## 2018-02-27 DIAGNOSIS — R0689 Other abnormalities of breathing: Secondary | ICD-10-CM | POA: Diagnosis not present

## 2018-02-27 DIAGNOSIS — E46 Unspecified protein-calorie malnutrition: Secondary | ICD-10-CM | POA: Diagnosis not present

## 2018-02-27 DIAGNOSIS — Z743 Need for continuous supervision: Secondary | ICD-10-CM | POA: Diagnosis not present

## 2018-02-27 DIAGNOSIS — R627 Adult failure to thrive: Secondary | ICD-10-CM | POA: Diagnosis not present

## 2018-02-27 DIAGNOSIS — J9602 Acute respiratory failure with hypercapnia: Secondary | ICD-10-CM | POA: Diagnosis not present

## 2018-02-27 DIAGNOSIS — R000 Tachycardia, unspecified: Secondary | ICD-10-CM | POA: Diagnosis not present

## 2018-02-27 DIAGNOSIS — I5030 Unspecified diastolic (congestive) heart failure: Secondary | ICD-10-CM | POA: Diagnosis not present

## 2018-02-27 DIAGNOSIS — K223 Perforation of esophagus: Secondary | ICD-10-CM | POA: Diagnosis not present

## 2018-02-27 DIAGNOSIS — J9601 Acute respiratory failure with hypoxia: Secondary | ICD-10-CM | POA: Diagnosis not present

## 2018-02-27 DIAGNOSIS — Z87891 Personal history of nicotine dependence: Secondary | ICD-10-CM | POA: Diagnosis not present

## 2018-02-27 DIAGNOSIS — K529 Noninfective gastroenteritis and colitis, unspecified: Secondary | ICD-10-CM | POA: Diagnosis not present

## 2018-02-27 DIAGNOSIS — G9341 Metabolic encephalopathy: Secondary | ICD-10-CM | POA: Diagnosis not present

## 2018-02-27 DIAGNOSIS — Y95 Nosocomial condition: Secondary | ICD-10-CM | POA: Diagnosis not present

## 2018-02-27 DIAGNOSIS — E785 Hyperlipidemia, unspecified: Secondary | ICD-10-CM | POA: Diagnosis not present

## 2018-02-27 DIAGNOSIS — N179 Acute kidney failure, unspecified: Secondary | ICD-10-CM | POA: Diagnosis not present

## 2018-02-27 DIAGNOSIS — R197 Diarrhea, unspecified: Secondary | ICD-10-CM | POA: Diagnosis not present

## 2018-02-27 DIAGNOSIS — R4182 Altered mental status, unspecified: Secondary | ICD-10-CM | POA: Diagnosis not present

## 2018-02-27 DIAGNOSIS — R0602 Shortness of breath: Secondary | ICD-10-CM | POA: Diagnosis not present

## 2018-02-27 DIAGNOSIS — M6281 Muscle weakness (generalized): Secondary | ICD-10-CM | POA: Diagnosis not present

## 2018-02-27 DIAGNOSIS — E161 Other hypoglycemia: Secondary | ICD-10-CM | POA: Diagnosis not present

## 2018-02-27 DIAGNOSIS — B9620 Unspecified Escherichia coli [E. coli] as the cause of diseases classified elsewhere: Secondary | ICD-10-CM | POA: Diagnosis not present

## 2018-02-27 DIAGNOSIS — R4701 Aphasia: Secondary | ICD-10-CM | POA: Diagnosis not present

## 2018-02-27 DIAGNOSIS — Z4682 Encounter for fitting and adjustment of non-vascular catheter: Secondary | ICD-10-CM | POA: Diagnosis not present

## 2018-02-27 DIAGNOSIS — D72829 Elevated white blood cell count, unspecified: Secondary | ICD-10-CM | POA: Diagnosis not present

## 2018-02-27 DIAGNOSIS — R112 Nausea with vomiting, unspecified: Secondary | ICD-10-CM | POA: Diagnosis not present

## 2018-02-27 DIAGNOSIS — E162 Hypoglycemia, unspecified: Secondary | ICD-10-CM | POA: Diagnosis not present

## 2018-02-27 DIAGNOSIS — I6340 Cerebral infarction due to embolism of unspecified cerebral artery: Secondary | ICD-10-CM | POA: Diagnosis not present

## 2018-02-27 DIAGNOSIS — J90 Pleural effusion, not elsewhere classified: Secondary | ICD-10-CM | POA: Diagnosis not present

## 2018-02-27 DIAGNOSIS — E11649 Type 2 diabetes mellitus with hypoglycemia without coma: Secondary | ICD-10-CM | POA: Diagnosis not present

## 2018-02-27 DIAGNOSIS — Z23 Encounter for immunization: Secondary | ICD-10-CM | POA: Diagnosis not present

## 2018-02-27 DIAGNOSIS — Z6822 Body mass index (BMI) 22.0-22.9, adult: Secondary | ICD-10-CM | POA: Diagnosis not present

## 2018-02-27 DIAGNOSIS — I639 Cerebral infarction, unspecified: Secondary | ICD-10-CM | POA: Diagnosis not present

## 2018-02-27 DIAGNOSIS — E872 Acidosis: Secondary | ICD-10-CM | POA: Diagnosis not present

## 2018-02-27 DIAGNOSIS — I82452 Acute embolism and thrombosis of left peroneal vein: Secondary | ICD-10-CM | POA: Diagnosis not present

## 2018-02-27 DIAGNOSIS — E43 Unspecified severe protein-calorie malnutrition: Secondary | ICD-10-CM | POA: Diagnosis not present

## 2018-02-27 DIAGNOSIS — I63411 Cerebral infarction due to embolism of right middle cerebral artery: Secondary | ICD-10-CM | POA: Diagnosis not present

## 2018-02-27 DIAGNOSIS — R091 Pleurisy: Secondary | ICD-10-CM | POA: Diagnosis not present

## 2018-02-27 DIAGNOSIS — J9690 Respiratory failure, unspecified, unspecified whether with hypoxia or hypercapnia: Secondary | ICD-10-CM | POA: Diagnosis not present

## 2018-02-27 DIAGNOSIS — I10 Essential (primary) hypertension: Secondary | ICD-10-CM | POA: Diagnosis not present

## 2018-02-27 DIAGNOSIS — I63412 Cerebral infarction due to embolism of left middle cerebral artery: Secondary | ICD-10-CM | POA: Diagnosis not present

## 2018-02-27 DIAGNOSIS — G8191 Hemiplegia, unspecified affecting right dominant side: Secondary | ICD-10-CM | POA: Diagnosis not present

## 2018-02-27 DIAGNOSIS — R531 Weakness: Secondary | ICD-10-CM | POA: Diagnosis not present

## 2018-02-27 LAB — CBC
HCT: 34.8 % — ABNORMAL LOW (ref 36.0–46.0)
Hemoglobin: 12 g/dL (ref 12.0–15.0)
MCH: 30.8 pg (ref 26.0–34.0)
MCHC: 34.5 g/dL (ref 30.0–36.0)
MCV: 89.2 fL (ref 80.0–100.0)
NRBC: 0 % (ref 0.0–0.2)
Platelets: 324 10*3/uL (ref 150–400)
RBC: 3.9 MIL/uL (ref 3.87–5.11)
RDW: 15.1 % (ref 11.5–15.5)
WBC: 9.8 10*3/uL (ref 4.0–10.5)

## 2018-02-27 LAB — MAGNESIUM: Magnesium: 1.4 mg/dL — ABNORMAL LOW (ref 1.7–2.4)

## 2018-02-27 MED ORDER — ENSURE ENLIVE PO LIQD: 237.0000 mL | Freq: Three times a day (TID) | ORAL | 12 refills | Status: DC

## 2018-02-27 MED ORDER — MAGNESIUM SULFATE 2 GM/50ML IV SOLN
2.0000 g | Freq: Once | INTRAVENOUS | Status: AC
  Administered 2018-02-27: 2 g via INTRAVENOUS
  Filled 2018-02-27: qty 50

## 2018-02-27 MED ORDER — THIAMINE HCL 100 MG PO TABS: 100.0000 mg | ORAL_TABLET | Freq: Every day | ORAL | 0 refills | Status: DC

## 2018-02-27 NOTE — Social Work (Signed)
Clinical Social Worker facilitated patient discharge including contacting patient family and facility to confirm patient discharge plans.  Clinical information faxed to facility and family agreeable with plan.  CSW arranged ambulance transport via [facility] to [facility] RN to call [phone]  with report prior to discharge.  Clinical Social Worker will sign off for now as social work intervention is no longer needed. Please consult us again if new need arises.  Areanna Gengler H Tamkia Temples, LCSWA Clinical Social Worker [phone]  

## 2018-02-27 NOTE — Progress Notes (Signed)
Nurse called report to [place] at [facility]  AVS printed and placed in drawer for [facility].

## 2018-02-27 NOTE — Clinical Social Work Placement (Signed)
   CLINICAL SOCIAL WORK PLACEMENT  NOTE [facility]  Date:  02/27/2018  Patient Details  Name: Maria Avila MRN: [id number] Date of Birth: 08/02/1950  Clinical Social Work is seeking post-discharge placement for this patient at the [facility] level of care (*CSW will initial, date and re-position this form in  chart as items are completed):  Yes   Patient/family provided with [facility] Work Department's list of facilities offering this level of care within the geographic area requested by the patient (or if unable, by the patient's family).  Yes   Patient/family informed of their freedom to choose among providers that offer the needed level of care, that participate in Medicare, Medicaid or managed care program needed by the patient, have an available bed and are willing to accept the patient.  Yes   Patient/family informed of [facility]'s ownership interest in [facility] and [facility], as well as of the fact that they are under no obligation to receive care at these facilities.  PASRR submitted to EDS on       PASRR number received on       Existing PASRR number confirmed on 02/18/18     FL2 transmitted to all facilities in geographic area requested by pt/family on 02/18/18     FL2 transmitted to all facilities within larger geographic area on       Patient informed that his/her managed care company has contracts with or will negotiate with certain facilities, including the following:        Yes   Patient/family informed of bed offers received.  Patient chooses bed at [facility]     Physician recommends and patient chooses bed at      Patient to be transferred to [facility] on 02/27/18.  Patient to be transferred to facility by [facility]     Patient family notified on 02/27/18 of transfer.  Name of family member notified:   pt husband Maria Avila  PHYSICIAN Please prepare priority discharge summary,  including medications, Please prepare prescriptions     Additional Comment:    _______________________________________________ Alexander Mt, LCSWA 02/27/2018, 9:20 AM

## 2018-02-27 NOTE — Progress Notes (Signed)
Patient transported via [facility] to [facility].

## 2018-02-27 NOTE — Discharge Summary (Signed)
Physician Discharge Summary  Maria Avila MCN:[id number] DOB: 1950-08-16 DOA: 02/16/2018  PCP: [name], MD  Admit date: 02/16/2018 Discharge date: 02/27/2018  Admitted From: Home Disposition: Skilled nursing facility  Recommendations for Outpatient Follow-up:  1. Follow up with PCP in 1-2 weeks 2. Please obtain BMP/CBC in one week your next doctors visit.  3. Follow-up outpatient with Dr. Therisa Doyne in 1-2 weeks   Discharge Condition: Stable CODE STATUS: Full code Diet recommendation: Diabetic  Brief/Interim Summary: 67 year old with history of diabetes mellitus type 2, essential hypertension, weight loss, anorexia and failure to thrive comes back to the hospital again with complaints of abdominal pain and inability to tolerate much of oral diet.  This has been an ongoing issue for 6 months requiring her multiple hospital visits for dehydration.  She has been evaluated previously for pancytopenia by oncology and also by gastroenterology.  CT angiogram done earlier this month showed diffuse colonic wall thickening which is nonspecific.  Previous endoscopies has been unrevealing.  Patient undergoing endoscopy and colonoscopy on 10/25 which showed as following: - Endoscopy 10/25- normal esophagus, acute gastritis -Colonoscopy 10/25- diverticulosis seen throughout, congested, erythematous and vascular pattern decreased   She was evaluated physical therapy recommended she would benefit from skilled nursing facility given her generalized weakness and deconditioning.  Arrangements were made with the assistance of social worker.  At this point she is reached maximum benefit from hospital stay and stable to be discharged.   Discharge Diagnoses:  Principal Problem:   Malnutrition of moderate degree Active Problems:   Weakness   Nausea vomiting and diarrhea   Diabetes (HCC)   Essential hypertension   Pancytopenia (HCC)   Hypotension   Diarrhea  Failure to thrive and weight loss, should  improve slowly Moderate severe protein calorie malnutrition with dehydration. Persistent diarrhea - Previously patient has had an extensive work-up including HIDA scan which has been unrevealing.  Gastroenterology consulted.  Plans to perform endoscopy and colonoscopy this admission.  - Endoscopy 10/25- normal esophagus, acute gastritis -Colonoscopy 10/25- diverticulosis seen throughout, congested, erythematous and vascular pattern decreased mucosa in the colon.  Biopsies taken, internal hemorrhoids. -Gastroenterology recommends-high-fiber diet, follow-up outpatient with Dr. Therisa Doyne. -Recent HIDA scan was negative. - Gentle hydration-normal saline 75 cc/h  Pancytopenia, improving - Continues to improve  Stage II gluteal ulcer -Patient should continue to receive local wound care, divided by wound care in the hospital on 02/21/2018 . Depression  -Continue mirtazapine.   Essential hypertension - Continue home medication  Hyperlipidemia -Continue statin  Given her generalized weakness, deconditioning and chronic comorbidities patient has been encouraged to remain physically active and improve her oral intake.  Otherwise she remains at very high risk of recurrent readmissions to the hospital, continual decline in infections.  On SCDs while patient was here Spoke with the patient's daughter and husband at bedside prior to discharge today Discharge the patient to skilled nursing facility today.   Discharge Instructions  Discharge Instructions    Diet general   Complete by:  As directed    Discharge instructions   Complete by:  As directed    Follow up with Dr. Stacie Glaze Gastroenterology 2-3 weeks   Increase activity slowly   Complete by:  As directed      Allergies as of 02/27/2018      Reactions   Crestor [rosuvastatin Calcium] Swelling, Other (See Comments)   Swelling of legs and muscle weakness   Zocor [simvastatin] Swelling, Other (See Comments)   Swelling of legs  and  muscle weakness      Medication List    TAKE these medications   aspirin EC 81 MG tablet Take 81 mg by mouth daily.   feeding supplement (ENSURE ENLIVE) Liqd Take 237 mLs by mouth 3 (three) times daily between meals.   Fish Oil 1000 MG Caps Take 1 capsule by mouth daily.   lisinopril 10 MG tablet Commonly known as:  PRINIVIL,ZESTRIL Take 10 mg by mouth daily.   mirtazapine 7.5 MG tablet Commonly known as:  REMERON Take 1 tablet (7.5 mg total) by mouth at bedtime.   multivitamin capsule Take 1 capsule by mouth daily.   naproxen sodium 220 MG tablet Commonly known as:  ALEVE Take 220 mg by mouth 2 (two) times daily as needed.   ondansetron 4 MG disintegrating tablet Commonly known as:  ZOFRAN-ODT Take 1 tablet (4 mg total) by mouth every 6 (six) hours as needed.   Pancrelipase (Lip-Prot-Amyl) 24000-76000 units Cpep Take 1 capsule (24,000 Units total) by mouth 3 (three) times daily with meals.   pantoprazole 40 MG tablet Commonly known as:  PROTONIX Take 40 mg by mouth daily.   pravastatin 40 MG tablet Commonly known as:  PRAVACHOL Take 40 mg by mouth every evening.   promethazine 12.5 MG tablet Commonly known as:  PHENERGAN Take 12.5 mg by mouth every 6 (six) hours as needed for nausea.   thiamine 100 MG tablet Take 1 tablet (100 mg total) by mouth daily. Start taking on:  02/28/2018   Vitamin D 2000 units Caps Take 1 capsule by mouth daily.            Durable Medical Equipment  (From admission, onward)         Start     Ordered   02/19/18 1038  For home use only DME 3 n 1  Once     02/19/18 1037   02/19/18 1037  For home use only DME lightweight manual wheelchair with seat cushion  Once    Comments:  Patient suffers from [facility]  which impairs their ability to perform daily activities like ambulating in the home .  A cane  will not resolve  issue with performing activities of daily living. A wheelchair will allow patient to  safely perform daily activities. Patient is not able to propel themselves in the home using a standard weight wheelchair due to [facility] . Patient can self propel in the lightweight wheelchair.  Accessories: elevating leg rests (ELRs), wheel locks, extensions and anti-tippers.   02/19/18 1037          Contact information for follow-up providers    [name], MD Follow up in 1 week(s).   Specialty:  Internal Medicine Contact information: [address] [place] [address] [phone]            Contact information for after-discharge care    Destination    [facility] Preferred SNF .   Service:  Skilled Nursing Contact information: [address] [place] [place] [address] [phone]                 Allergies  Allergen Reactions  . Crestor [Rosuvastatin Calcium] Swelling and Other (See Comments)    Swelling of legs and muscle weakness  . Zocor [Simvastatin] Swelling and Other (See Comments)    Swelling of legs and muscle weakness    You were cared for by a hospitalist during your hospital stay. If you have any questions about your discharge medications or  the care you received while you were in the hospital after you are discharged, you can call the unit and asked to speak with the hospitalist on call if the hospitalist that took care of you is not available. Once you are discharged, your primary care physician will handle any further medical issues. Please note that no refills for any discharge medications will be authorized once you are discharged, as it is imperative that you return to your primary care physician (or establish a relationship with a primary care physician if you do not have one) for your aftercare needs so that they can reassess your need for medications and monitor your lab values.  Consultations:  Gastroenterology   Procedures/Studies: Ct Head Wo Contrast  Result Date:  02/16/2018 CLINICAL DATA:  Generalize weakness and slurred speech for 2 weeks. EXAM: CT HEAD WITHOUT CONTRAST TECHNIQUE: Contiguous axial images were obtained from the base of the skull through the vertex without intravenous contrast. COMPARISON:  04/01/2005 FINDINGS: Brain: No evidence of acute infarction, hemorrhage, hydrocephalus, extra-axial collection or mass lesion/mass effect. Vascular: Mild intracranial arterial vascular calcifications. Skull: Calvarium appears intact. Sinuses/Orbits: Paranasal sinuses and mastoid air cells are clear. Other: None. IMPRESSION: No acute intracranial abnormality. Electronically Signed   By: Lucienne Capers M.D.   On: 02/16/2018 06:24   Mr Brain Wo Contrast  Result Date: 02/16/2018 CLINICAL DATA:  Weight loss and weakness. EXAM: MRI HEAD WITHOUT CONTRAST TECHNIQUE: Multiplanar, multiecho pulse sequences of the brain and surrounding structures were obtained without intravenous contrast. COMPARISON:  CT same day FINDINGS: Brain: Generalized atrophy. Diffusion imaging does not show any acute or subacute infarction. The brainstem and cerebellum are normal. Mild chronic small-vessel ischemic change of the cerebral hemispheric deep white matter. No cortical or large vessel territory infarction. No mass lesion, hemorrhage, hydrocephalus or extra-axial collection. Vascular: Major vessels at the base of the brain show flow. Skull and upper cervical spine: Negative Sinuses/Orbits: Clear/normal Other: None IMPRESSION: No acute or reversible finding. Generalized atrophy. Mild chronic small-vessel ischemic change of the cerebral hemispheric white matter. Electronically Signed   By: Nelson Chimes M.D.   On: 02/16/2018 21:52   Nm Hepato W/eject Fract  Result Date: 02/18/2018 CLINICAL DATA:  Weight loss. EXAM: NUCLEAR MEDICINE HEPATOBILIARY IMAGING WITH GALLBLADDER EF TECHNIQUE: Sequential images of the abdomen were obtained out to 60 minutes following intravenous administration of  radiopharmaceutical. After oral ingestion of Ensure, gallbladder ejection fraction was determined. At 60 min, normal ejection fraction is greater than 33%. RADIOPHARMACEUTICALS:  5.3 mCi Tc-79m  Choletec IV COMPARISON:  None. FINDINGS: Prompt uptake and biliary excretion of activity by the liver is seen. Gallbladder activity is visualized, consistent with patency of cystic duct. Biliary activity passes into small bowel, consistent with patent common bile duct. The gallbladder emptied spontaneously before the Ensure. As a result, an ejection fraction cannot be accurately calculated. That being said, the gallbladder is not visualized after ensure and gallbladder emptying is near-complete. IMPRESSION: The gallbladder emptied spontaneously before consumption of Ensure. While an ejection fraction cannot be calculated, gallbladder emptying is near complete. Electronically Signed   By: Dorise Bullion III M.D   On: 02/18/2018 13:40   Dg Chest Port 1 View  Result Date: 02/22/2018 CLINICAL DATA:  Leukocytosis. EXAM: PORTABLE CHEST 1 VIEW COMPARISON:  None. FINDINGS: Subtle asymmetric prominence of the LEFT perihilar shadow, suspicious for perihilar pneumonia and/or lymphadenopathy. Subtle opacity at the LEFT lung base, compatible with pneumonia or mild atelectasis. RIGHT lung is clear. Osseous structures about the  chest are unremarkable. IMPRESSION: 1. Suspected LEFT perihilar pneumonia and/or lymphadenopathy. Consider chest CT for confirmation. 2. Subtle opacity at the LEFT lung base which could represent pneumonia, atelectasis and/or small pleural effusion. Electronically Signed   By: Franki Cabot M.D.   On: 02/22/2018 16:10   Ct Angio Abd/pel W And/or Wo Contrast  Result Date: 02/05/2018 CLINICAL DATA:  Persistent acute abdominal pain, nausea, emesis and diarrhea EXAM: CT ANGIOGRAPHY ABDOMEN AND PELVIS WITH CONTRAST AND WITHOUT CONTRAST TECHNIQUE: Multidetector CT imaging of the abdomen and pelvis was performed  using the standard protocol during bolus administration of intravenous contrast. Multiplanar reconstructed images and MIPs were obtained and reviewed to evaluate the vascular anatomy. CONTRAST:  124mL ISOVUE-370 IOPAMIDOL (ISOVUE-370) INJECTION 76% COMPARISON:  07/29/2017 FINDINGS: VASCULAR Aorta: Aorta is atherosclerotic and mildly ectatic, proximal aortic diameter is 2.8 cm in the suprarenal aorta. Negative for significant aneurysm, dissection, occlusive process, retroperitoneal hemorrhage/hematoma, or rupture. Celiac: Widely patent including its branches SMA: Widely patent including its branches Renals: Widely patent bilaterally.  No accessory renal artery. IMA: Remains patent off the distal aorta including its branches Inflow: Atherosclerotic and tortuous iliac vessels without inflow disease or occlusion. No iliac aneurysm or dissection. The common, internal and external iliac arteries remain patent. Proximal Outflow: The common femoral, proximal profunda femoral, and superficial femoral arteries demonstrated are also widely patent. Veins: No veno-occlusive process. Review of the MIP images confirms the above findings. NON-VASCULAR Lower chest: No acute abnormality. Hepatobiliary: Diffuse hypoattenuation of the liver compatible with hepatic steatosis. No biliary dilatation or focal hepatic abnormality. Gallbladder mildly distended, nonspecific. Common bile duct nondilated. Pancreas: Unremarkable. No pancreatic ductal dilatation or surrounding inflammatory changes. Spleen: Normal in size without focal abnormality. Adrenals/Urinary Tract: Adrenal glands are unremarkable. Kidneys are normal, without renal calculi, focal lesion, or hydronephrosis. Bladder is unremarkable. Stomach/Bowel: Negative for bowel obstruction, significant dilatation, ileus, or free air. No fluid collection, abscess or ascites. Scattered colonic diverticulosis. Diffuse mild colonic wall thickening/edema suggesting nonspecific colitis.  Appendix unremarkable. Lymphatic: No adenopathy. Reproductive: Uterus and bilateral adnexa are unremarkable. Other: No abdominal wall hernia or abnormality. No abdominopelvic ascites. Musculoskeletal: Degenerative changes of the spine. Multilevel facet arthropathy from L2-S1. Acute osseous finding. IMPRESSION: VASCULAR Aortoiliac atherosclerosis, mild ectasia and tortuosity without acute vascular process. Maximal diameter 2.8 cm as above. Ectatic abdominal aorta at risk for aneurysm development. Recommend followup by ultrasound in 5 years. This recommendation follows ACR consensus guidelines: White Paper of the ACR Incidental Findings Committee II on Vascular Findings. J Am Coll Radiol 2013; 10:789-794. Patent mesenteric and renal vasculature without occlusive disease Patent tortuous iliac vessels without inflow disease. NON-VASCULAR Hepatic steatosis Diffuse colonic wall thickening/edema compatible with nonspecific colitis. Scattered minor diverticulosis as well. No fluid collection, abscess, perforation, obstruction pattern or ascites. Electronically Signed   By: Jerilynn Mages.  Shick M.D.   On: 02/05/2018 19:50   US Abdomen Limited Ruq  Result Date: 02/05/2018 CLINICAL DATA:  Abdominal pain. EXAM: ULTRASOUND ABDOMEN LIMITED RIGHT UPPER QUADRANT COMPARISON:  None. FINDINGS: Gallbladder: No gallstones or wall thickening visualized. No sonographic Murphy sign noted by sonographer. Common bile duct: Diameter: 6.9 mm Liver: Hepatic steatosis. No focal mass. Portal vein is patent on color Doppler imaging with normal direction of blood flow towards the liver. IMPRESSION: 1. The gallbladder is normal in appearance. 2. The common bile duct measures 6.9 mm which is borderline for age. Recommend correlation with labs. If there is concern for biliary obstruction, recommend an MRCP or ERCP. Electronically Signed   By: Shanon Brow  Jimmye Norman III M.D   On: 02/05/2018 21:23      Subjective: Awake and alert sitting in the bed during my  first evaluation this morning.  When I returned to the room later on she was sitting in the bed eating her breakfast.  Family was at bedside including daughter and husband.  General = no fevers, chills, dizziness, malaise, fatigue HEENT/EYES = negative for pain, redness, loss of vision, double vision, blurred vision, loss of hearing, sore throat, hoarseness, dysphagia Cardiovascular= negative for chest pain, palpitation, murmurs, lower extremity swelling Respiratory/lungs= negative for shortness of breath, cough, hemoptysis, wheezing, mucus production Gastrointestinal= negative for nausea, vomiting,, abdominal pain, melena, hematemesis Genitourinary= negative for Dysuria, Hematuria, Change in Urinary Frequency MSK = Negative for arthralgia, myalgias, Back Pain, Joint swelling  Neurology= Negative for headache, seizures, numbness, tingling  Psychiatry= Negative for anxiety, depression, suicidal and homocidal ideation Allergy/Immunology= Medication/Food allergy as listed  Skin= Negative for Rash, lesions, ulcers, itching   Discharge Exam: Vitals:   02/26/18 2110 02/27/18 0552  BP: (!) 157/137 (!) 124/106  Pulse: 98 (!) 102  Resp:  20  Temp: 97.6 F (36.4 C) (!) 97.5 F (36.4 C)  SpO2: 99% 99%   Vitals:   02/26/18 1033 02/26/18 1513 02/26/18 2110 02/27/18 0552  BP: 113/84 101/90 (!) 157/137 (!) 124/106  Pulse: 94 97 98 (!) 102  Resp: 17 18  20   Temp:   97.6 F (36.4 C) (!) 97.5 F (36.4 C)  TempSrc:   Oral Oral  SpO2: 100% 100% 99% 99%  Weight:      Height:        General: Pt is alert, awake, not in acute distress, generally weak appearing. Cardiovascular: RRR, S1/S2 +, no rubs, no gallops Respiratory: CTA bilaterally, no wheezing, no rhonchi Abdominal: Soft, NT, ND, bowel sounds + Extremities: no edema, no cyanosis    The results of significant diagnostics from this hospitalization (including imaging, microbiology, ancillary and laboratory) are listed below for  reference.     Microbiology: No results found for this or any previous visit (from the past 240 hour(s)).   Labs: BNP (last 3 results) No results for input(s): BNP in the last 8760 hours. Basic Metabolic Panel: Recent Labs  Lab 02/22/18 0311  02/23/18 0255 02/24/18 0211 02/25/18 0253 02/26/18 0519 02/27/18 0423  NA 139  --  138 138 137 141  --   K 2.8*  --  3.4* 4.6 4.3 3.8  --   CL 117*  --  116* 115* 110 110  --   CO2 18*  --  18* 20* 20* 21*  --   GLUCOSE 65*  --  84 84 82 69*  --   BUN <5*  --  5* 6* 5* <5*  --   CREATININE 0.58  --  0.63 0.69 0.68 0.59  --   CALCIUM 8.5*  --  8.6* 8.7* 8.9 9.1  --   MG  --    < > 1.9 1.8 1.5* 1.6* 1.4*   < > = values in this interval not displayed.   Liver Function Tests: Recent Labs  Lab 02/23/18 0255 02/24/18 0211 02/25/18 0253 02/26/18 0519  AST 31 26 28 29   ALT 33 31 33 32  ALKPHOS 92 92 110 117  BILITOT 1.0 1.0 1.2 1.4*  PROT 4.8* 4.8* 5.1* 4.9*  ALBUMIN 1.9* 1.9* 1.9* 1.9*   No results for input(s): LIPASE, AMYLASE in the last 168 hours. No results for input(s): AMMONIA in the last  168 hours. CBC: Recent Labs  Lab 02/22/18 0311  02/23/18 1242 02/24/18 0211 02/25/18 0253 02/26/18 0519 02/27/18 0423  WBC 13.7*  --  7.6 7.7 8.2 8.2 9.8  NEUTROABS 9.6*  --  5.4  --   --   --   --   HGB 10.5*  --  11.5* 11.4* 12.1 12.0 12.0  HCT 30.5*  --  31.9* 32.7* 34.6* 34.1* 34.8*  MCV 88.7  --  87.9 88.9 88.7 88.3 89.2  PLT 36*   < > 72* 110* 192 263 324   < > = values in this interval not displayed.   Cardiac Enzymes: No results for input(s): CKTOTAL, CKMB, CKMBINDEX, TROPONINI in the last 168 hours. BNP: Invalid input(s): POCBNP CBG: No results for input(s): GLUCAP in the last 168 hours. D-Dimer No results for input(s): DDIMER in the last 72 hours. Hgb A1c No results for input(s): HGBA1C in the last 72 hours. Lipid Profile No results for input(s): CHOL, HDL, LDLCALC, TRIG, CHOLHDL, LDLDIRECT in the last 72  hours. Thyroid function studies No results for input(s): TSH, T4TOTAL, T3FREE, THYROIDAB in the last 72 hours.  Invalid input(s): FREET3 Anemia work up No results for input(s): VITAMINB12, FOLATE, FERRITIN, TIBC, IRON, RETICCTPCT in the last 72 hours. Urinalysis    Component Value Date/Time   COLORURINE AMBER (A) 02/05/2018 1511   APPEARANCEUR HAZY (A) 02/05/2018 1511   LABSPEC 1.025 02/05/2018 1511   PHURINE 6.0 02/05/2018 1511   GLUCOSEU NEGATIVE 02/05/2018 1511   HGBUR NEGATIVE 02/05/2018 1511   BILIRUBINUR SMALL (A) 02/05/2018 1511   KETONESUR 20 (A) 02/05/2018 1511   PROTEINUR 30 (A) 02/05/2018 1511   UROBILINOGEN 0.2 11/25/2007 0111   NITRITE NEGATIVE 02/05/2018 1511   LEUKOCYTESUR TRACE (A) 02/05/2018 1511   Sepsis Labs Invalid input(s): PROCALCITONIN,  WBC,  LACTICIDVEN Microbiology No results found for this or any previous visit (from the past 240 hour(s)).   Time coordinating discharge:  I have spent 35 minutes face to face with the patient and on the ward discussing the patients care, assessment, plan and disposition with other care givers. >50% of the time was devoted counseling the patient about the risks and benefits of treatment/Discharge disposition and coordinating care.   SIGNED:   Damita Lack, MD  [facility] 02/27/2018, 10:06 AM Pager   If 7PM-7AM, please contact night-coverage [email or web address] Password [email or web address]

## 2018-03-01 ENCOUNTER — Encounter (HOSPITAL_COMMUNITY): Payer: Self-pay | Admitting: Gastroenterology

## 2018-03-02 DIAGNOSIS — E119 Type 2 diabetes mellitus without complications: Secondary | ICD-10-CM | POA: Diagnosis not present

## 2018-03-02 DIAGNOSIS — I10 Essential (primary) hypertension: Secondary | ICD-10-CM | POA: Diagnosis not present

## 2018-03-02 DIAGNOSIS — D61818 Other pancytopenia: Secondary | ICD-10-CM | POA: Diagnosis not present

## 2018-03-02 DIAGNOSIS — E440 Moderate protein-calorie malnutrition: Secondary | ICD-10-CM | POA: Diagnosis not present

## 2018-03-04 ENCOUNTER — Other Ambulatory Visit: Payer: Self-pay

## 2018-03-04 ENCOUNTER — Emergency Department (HOSPITAL_COMMUNITY): Payer: Medicare Other

## 2018-03-04 ENCOUNTER — Inpatient Hospital Stay (HOSPITAL_COMMUNITY): Payer: Medicare Other

## 2018-03-04 ENCOUNTER — Inpatient Hospital Stay (HOSPITAL_COMMUNITY)
Admission: EM | Admit: 2018-03-04 | Discharge: 2018-03-17 | DRG: 871 | Disposition: A | Discharge status: Rehabilitation Facility | Payer: Medicare Other | Attending: Internal Medicine | Admitting: Internal Medicine

## 2018-03-04 ENCOUNTER — Encounter (HOSPITAL_COMMUNITY): Payer: Self-pay

## 2018-03-04 DIAGNOSIS — R6521 Severe sepsis with septic shock: Secondary | ICD-10-CM | POA: Diagnosis not present

## 2018-03-04 DIAGNOSIS — I69391 Dysphagia following cerebral infarction: Secondary | ICD-10-CM | POA: Diagnosis not present

## 2018-03-04 DIAGNOSIS — R4701 Aphasia: Secondary | ICD-10-CM | POA: Diagnosis not present

## 2018-03-04 DIAGNOSIS — Z6822 Body mass index (BMI) 22.0-22.9, adult: Secondary | ICD-10-CM

## 2018-03-04 DIAGNOSIS — L89303 Pressure ulcer of unspecified buttock, stage 3: Secondary | ICD-10-CM | POA: Diagnosis not present

## 2018-03-04 DIAGNOSIS — Z4682 Encounter for fitting and adjustment of non-vascular catheter: Secondary | ICD-10-CM | POA: Diagnosis not present

## 2018-03-04 DIAGNOSIS — E872 Acidosis: Secondary | ICD-10-CM | POA: Diagnosis present

## 2018-03-04 DIAGNOSIS — Z888 Allergy status to other drugs, medicaments and biological substances status: Secondary | ICD-10-CM

## 2018-03-04 DIAGNOSIS — L8990 Pressure ulcer of unspecified site, unspecified stage: Secondary | ICD-10-CM

## 2018-03-04 DIAGNOSIS — A4152 Sepsis due to Pseudomonas: Principal | ICD-10-CM | POA: Diagnosis present

## 2018-03-04 DIAGNOSIS — D638 Anemia in other chronic diseases classified elsewhere: Secondary | ICD-10-CM | POA: Diagnosis present

## 2018-03-04 DIAGNOSIS — I82453 Acute embolism and thrombosis of peroneal vein, bilateral: Secondary | ICD-10-CM | POA: Diagnosis not present

## 2018-03-04 DIAGNOSIS — Z7982 Long term (current) use of aspirin: Secondary | ICD-10-CM

## 2018-03-04 DIAGNOSIS — Z820 Family history of epilepsy and other diseases of the nervous system: Secondary | ICD-10-CM | POA: Diagnosis not present

## 2018-03-04 DIAGNOSIS — A419 Sepsis, unspecified organism: Secondary | ICD-10-CM

## 2018-03-04 DIAGNOSIS — I959 Hypotension, unspecified: Secondary | ICD-10-CM | POA: Diagnosis present

## 2018-03-04 DIAGNOSIS — F1010 Alcohol abuse, uncomplicated: Secondary | ICD-10-CM | POA: Diagnosis not present

## 2018-03-04 DIAGNOSIS — J9602 Acute respiratory failure with hypercapnia: Secondary | ICD-10-CM | POA: Diagnosis not present

## 2018-03-04 DIAGNOSIS — Z7189 Other specified counseling: Secondary | ICD-10-CM | POA: Diagnosis not present

## 2018-03-04 DIAGNOSIS — R4182 Altered mental status, unspecified: Secondary | ICD-10-CM

## 2018-03-04 DIAGNOSIS — I10 Essential (primary) hypertension: Secondary | ICD-10-CM | POA: Diagnosis present

## 2018-03-04 DIAGNOSIS — I82452 Acute embolism and thrombosis of left peroneal vein: Secondary | ICD-10-CM | POA: Diagnosis present

## 2018-03-04 DIAGNOSIS — I82441 Acute embolism and thrombosis of right tibial vein: Secondary | ICD-10-CM | POA: Diagnosis not present

## 2018-03-04 DIAGNOSIS — D696 Thrombocytopenia, unspecified: Secondary | ICD-10-CM | POA: Diagnosis not present

## 2018-03-04 DIAGNOSIS — R569 Unspecified convulsions: Secondary | ICD-10-CM | POA: Diagnosis not present

## 2018-03-04 DIAGNOSIS — I63411 Cerebral infarction due to embolism of right middle cerebral artery: Secondary | ICD-10-CM | POA: Diagnosis not present

## 2018-03-04 DIAGNOSIS — K223 Perforation of esophagus: Secondary | ICD-10-CM | POA: Diagnosis not present

## 2018-03-04 DIAGNOSIS — R531 Weakness: Secondary | ICD-10-CM | POA: Diagnosis not present

## 2018-03-04 DIAGNOSIS — Z800 Family history of malignant neoplasm of digestive organs: Secondary | ICD-10-CM | POA: Diagnosis not present

## 2018-03-04 DIAGNOSIS — L89154 Pressure ulcer of sacral region, stage 4: Secondary | ICD-10-CM | POA: Diagnosis not present

## 2018-03-04 DIAGNOSIS — J939 Pneumothorax, unspecified: Secondary | ICD-10-CM

## 2018-03-04 DIAGNOSIS — I5030 Unspecified diastolic (congestive) heart failure: Secondary | ICD-10-CM | POA: Diagnosis not present

## 2018-03-04 DIAGNOSIS — J869 Pyothorax without fistula: Secondary | ICD-10-CM | POA: Diagnosis not present

## 2018-03-04 DIAGNOSIS — N179 Acute kidney failure, unspecified: Secondary | ICD-10-CM | POA: Diagnosis not present

## 2018-03-04 DIAGNOSIS — J90 Pleural effusion, not elsewhere classified: Secondary | ICD-10-CM | POA: Diagnosis present

## 2018-03-04 DIAGNOSIS — E43 Unspecified severe protein-calorie malnutrition: Secondary | ICD-10-CM | POA: Diagnosis not present

## 2018-03-04 DIAGNOSIS — R6520 Severe sepsis without septic shock: Secondary | ICD-10-CM | POA: Diagnosis not present

## 2018-03-04 DIAGNOSIS — B952 Enterococcus as the cause of diseases classified elsewhere: Secondary | ICD-10-CM | POA: Diagnosis present

## 2018-03-04 DIAGNOSIS — E876 Hypokalemia: Secondary | ICD-10-CM | POA: Diagnosis not present

## 2018-03-04 DIAGNOSIS — R0602 Shortness of breath: Secondary | ICD-10-CM | POA: Diagnosis not present

## 2018-03-04 DIAGNOSIS — Z9689 Presence of other specified functional implants: Secondary | ICD-10-CM

## 2018-03-04 DIAGNOSIS — Z87891 Personal history of nicotine dependence: Secondary | ICD-10-CM

## 2018-03-04 DIAGNOSIS — E440 Moderate protein-calorie malnutrition: Secondary | ICD-10-CM

## 2018-03-04 DIAGNOSIS — I82443 Acute embolism and thrombosis of tibial vein, bilateral: Secondary | ICD-10-CM | POA: Diagnosis present

## 2018-03-04 DIAGNOSIS — Y95 Nosocomial condition: Secondary | ICD-10-CM | POA: Diagnosis present

## 2018-03-04 DIAGNOSIS — D72829 Elevated white blood cell count, unspecified: Secondary | ICD-10-CM | POA: Diagnosis not present

## 2018-03-04 DIAGNOSIS — D709 Neutropenia, unspecified: Secondary | ICD-10-CM | POA: Diagnosis not present

## 2018-03-04 DIAGNOSIS — G8191 Hemiplegia, unspecified affecting right dominant side: Secondary | ICD-10-CM | POA: Diagnosis present

## 2018-03-04 DIAGNOSIS — R4587 Impulsiveness: Secondary | ICD-10-CM | POA: Diagnosis not present

## 2018-03-04 DIAGNOSIS — R339 Retention of urine, unspecified: Secondary | ICD-10-CM | POA: Diagnosis not present

## 2018-03-04 DIAGNOSIS — I6340 Cerebral infarction due to embolism of unspecified cerebral artery: Secondary | ICD-10-CM | POA: Diagnosis not present

## 2018-03-04 DIAGNOSIS — R1311 Dysphagia, oral phase: Secondary | ICD-10-CM | POA: Diagnosis not present

## 2018-03-04 DIAGNOSIS — B9620 Unspecified Escherichia coli [E. coli] as the cause of diseases classified elsewhere: Secondary | ICD-10-CM | POA: Diagnosis present

## 2018-03-04 DIAGNOSIS — J439 Emphysema, unspecified: Secondary | ICD-10-CM | POA: Diagnosis not present

## 2018-03-04 DIAGNOSIS — R6889 Other general symptoms and signs: Secondary | ICD-10-CM | POA: Diagnosis not present

## 2018-03-04 DIAGNOSIS — R197 Diarrhea, unspecified: Secondary | ICD-10-CM | POA: Diagnosis not present

## 2018-03-04 DIAGNOSIS — I824Z3 Acute embolism and thrombosis of unspecified deep veins of distal lower extremity, bilateral: Secondary | ICD-10-CM | POA: Diagnosis not present

## 2018-03-04 DIAGNOSIS — Z79899 Other long term (current) drug therapy: Secondary | ICD-10-CM

## 2018-03-04 DIAGNOSIS — Z01818 Encounter for other preprocedural examination: Secondary | ICD-10-CM

## 2018-03-04 DIAGNOSIS — I63133 Cerebral infarction due to embolism of bilateral carotid arteries: Secondary | ICD-10-CM | POA: Diagnosis not present

## 2018-03-04 DIAGNOSIS — J9690 Respiratory failure, unspecified, unspecified whether with hypoxia or hypercapnia: Secondary | ICD-10-CM

## 2018-03-04 DIAGNOSIS — R000 Tachycardia, unspecified: Secondary | ICD-10-CM | POA: Diagnosis not present

## 2018-03-04 DIAGNOSIS — J690 Pneumonitis due to inhalation of food and vomit: Secondary | ICD-10-CM | POA: Diagnosis not present

## 2018-03-04 DIAGNOSIS — I63413 Cerebral infarction due to embolism of bilateral middle cerebral arteries: Secondary | ICD-10-CM | POA: Diagnosis not present

## 2018-03-04 DIAGNOSIS — Z743 Need for continuous supervision: Secondary | ICD-10-CM | POA: Diagnosis not present

## 2018-03-04 DIAGNOSIS — R451 Restlessness and agitation: Secondary | ICD-10-CM | POA: Diagnosis not present

## 2018-03-04 DIAGNOSIS — G9341 Metabolic encephalopathy: Secondary | ICD-10-CM | POA: Diagnosis not present

## 2018-03-04 DIAGNOSIS — R091 Pleurisy: Secondary | ICD-10-CM | POA: Diagnosis not present

## 2018-03-04 DIAGNOSIS — E11649 Type 2 diabetes mellitus with hypoglycemia without coma: Secondary | ICD-10-CM | POA: Diagnosis not present

## 2018-03-04 DIAGNOSIS — I6349 Cerebral infarction due to embolism of other cerebral artery: Secondary | ICD-10-CM | POA: Diagnosis not present

## 2018-03-04 DIAGNOSIS — K228 Other specified diseases of esophagus: Secondary | ICD-10-CM | POA: Diagnosis not present

## 2018-03-04 DIAGNOSIS — Z6820 Body mass index (BMI) 20.0-20.9, adult: Secondary | ICD-10-CM | POA: Diagnosis not present

## 2018-03-04 DIAGNOSIS — Z4659 Encounter for fitting and adjustment of other gastrointestinal appliance and device: Secondary | ICD-10-CM

## 2018-03-04 DIAGNOSIS — D62 Acute posthemorrhagic anemia: Secondary | ICD-10-CM | POA: Diagnosis not present

## 2018-03-04 DIAGNOSIS — J9601 Acute respiratory failure with hypoxia: Secondary | ICD-10-CM | POA: Diagnosis not present

## 2018-03-04 DIAGNOSIS — E161 Other hypoglycemia: Secondary | ICD-10-CM | POA: Diagnosis not present

## 2018-03-04 DIAGNOSIS — R4020 Unspecified coma: Secondary | ICD-10-CM | POA: Diagnosis not present

## 2018-03-04 DIAGNOSIS — R627 Adult failure to thrive: Secondary | ICD-10-CM | POA: Diagnosis not present

## 2018-03-04 DIAGNOSIS — I63412 Cerebral infarction due to embolism of left middle cerebral artery: Secondary | ICD-10-CM | POA: Diagnosis not present

## 2018-03-04 DIAGNOSIS — E162 Hypoglycemia, unspecified: Secondary | ICD-10-CM | POA: Diagnosis not present

## 2018-03-04 DIAGNOSIS — Z515 Encounter for palliative care: Secondary | ICD-10-CM

## 2018-03-04 DIAGNOSIS — R0689 Other abnormalities of breathing: Secondary | ICD-10-CM | POA: Diagnosis not present

## 2018-03-04 DIAGNOSIS — I69351 Hemiplegia and hemiparesis following cerebral infarction affecting right dominant side: Secondary | ICD-10-CM | POA: Diagnosis not present

## 2018-03-04 DIAGNOSIS — R404 Transient alteration of awareness: Secondary | ICD-10-CM | POA: Diagnosis not present

## 2018-03-04 DIAGNOSIS — E119 Type 2 diabetes mellitus without complications: Secondary | ICD-10-CM | POA: Diagnosis not present

## 2018-03-04 DIAGNOSIS — I6310 Cerebral infarction due to embolism of unspecified precerebral artery: Secondary | ICD-10-CM | POA: Diagnosis not present

## 2018-03-04 DIAGNOSIS — I69319 Unspecified symptoms and signs involving cognitive functions following cerebral infarction: Secondary | ICD-10-CM | POA: Diagnosis not present

## 2018-03-04 DIAGNOSIS — I639 Cerebral infarction, unspecified: Secondary | ICD-10-CM | POA: Diagnosis not present

## 2018-03-04 LAB — GLUCOSE, CAPILLARY
Glucose-Capillary: 138 mg/dL — ABNORMAL HIGH (ref 70–99)
Glucose-Capillary: 126 mg/dL — ABNORMAL HIGH (ref 70–99)

## 2018-03-04 LAB — POCT I-STAT 3, ART BLOOD GAS (G3+)
Acid-base deficit: 10 mmol/L — ABNORMAL HIGH (ref 0.0–2.0)
Acid-base deficit: 12 mmol/L — ABNORMAL HIGH (ref 0.0–2.0)
Bicarbonate: 16.3 mmol/L — ABNORMAL LOW (ref 20.0–28.0)
Bicarbonate: 15 mmol/L — ABNORMAL LOW (ref 20.0–28.0)
O2 SAT: 100 %
O2 SAT: 97 %
PCO2 ART: 34.3 mmHg (ref 32.0–48.0)
PH ART: 7.275 — AB (ref 7.350–7.450)
PH ART: 7.202 — AB (ref 7.350–7.450)
PO2 ART: 189 mmHg — AB (ref 83.0–108.0)
Patient temperature: 95.4
TCO2: 17 mmol/L — ABNORMAL LOW (ref 22–32)
TCO2: 16 mmol/L — AB (ref 22–32)
pCO2 arterial: 37.9 mmHg (ref 32.0–48.0)
pO2, Arterial: 105 mmHg (ref 83.0–108.0)

## 2018-03-04 LAB — URINALYSIS, ROUTINE W REFLEX MICROSCOPIC
BILIRUBIN URINE: NEGATIVE
GLUCOSE, UA: NEGATIVE mg/dL
KETONES UR: NEGATIVE mg/dL
Nitrite: NEGATIVE
PROTEIN: 100 mg/dL — AB
Specific Gravity, Urine: 1.015 (ref 1.005–1.030)
pH: 5 (ref 5.0–8.0)

## 2018-03-04 LAB — COMPREHENSIVE METABOLIC PANEL
ALT: 24 U/L (ref 0–44)
ALT: 28 U/L (ref 0–44)
ANION GAP: 10 (ref 5–15)
AST: 31 U/L (ref 15–41)
AST: 34 U/L (ref 15–41)
Albumin: 1.3 g/dL — ABNORMAL LOW (ref 3.5–5.0)
Albumin: 1.3 g/dL — ABNORMAL LOW (ref 3.5–5.0)
Alkaline Phosphatase: 110 U/L (ref 38–126)
Alkaline Phosphatase: 153 U/L — ABNORMAL HIGH (ref 38–126)
Anion gap: 12 (ref 5–15)
BUN: 29 mg/dL — ABNORMAL HIGH (ref 8–23)
BUN: 30 mg/dL — ABNORMAL HIGH (ref 8–23)
CHLORIDE: 111 mmol/L (ref 98–111)
CHLORIDE: 108 mmol/L (ref 98–111)
CO2: 18 mmol/L — ABNORMAL LOW (ref 22–32)
CO2: 16 mmol/L — AB (ref 22–32)
Calcium: 7.9 mg/dL — ABNORMAL LOW (ref 8.9–10.3)
Calcium: 8.4 mg/dL — ABNORMAL LOW (ref 8.9–10.3)
Creatinine, Ser: 1.96 mg/dL — ABNORMAL HIGH (ref 0.44–1.00)
Creatinine, Ser: 1.97 mg/dL — ABNORMAL HIGH (ref 0.44–1.00)
GFR calc non Af Amer: 25 mL/min — ABNORMAL LOW (ref >60)
GFR calc non Af Amer: 25 mL/min — ABNORMAL LOW (ref >60)
GFR, EST AFRICAN AMERICAN: 29 mL/min — AB (ref >60)
GFR, EST AFRICAN AMERICAN: 29 mL/min — AB (ref >60)
Glucose, Bld: 111 mg/dL — ABNORMAL HIGH (ref 70–99)
Glucose, Bld: 156 mg/dL — ABNORMAL HIGH (ref 70–99)
POTASSIUM: 3.5 mmol/L (ref 3.5–5.1)
Potassium: 3.8 mmol/L (ref 3.5–5.1)
SODIUM: 139 mmol/L (ref 135–145)
SODIUM: 136 mmol/L (ref 135–145)
Total Bilirubin: 1.5 mg/dL — ABNORMAL HIGH (ref 0.3–1.2)
Total Bilirubin: 2.1 mg/dL — ABNORMAL HIGH (ref 0.3–1.2)
Total Protein: 4.1 g/dL — ABNORMAL LOW (ref 6.5–8.1)
Total Protein: 4.7 g/dL — ABNORMAL LOW (ref 6.5–8.1)

## 2018-03-04 LAB — APTT: aPTT: 35 seconds (ref 24–36)

## 2018-03-04 LAB — CBC WITH DIFFERENTIAL/PLATELET
ABS IMMATURE GRANULOCYTES: 0.56 10*3/uL — AB (ref 0.00–0.07)
BASOS ABS: 0 10*3/uL (ref 0.0–0.1)
Basophils Relative: 0 %
Eosinophils Absolute: 0 10*3/uL (ref 0.0–0.5)
Eosinophils Relative: 0 %
HCT: 31.5 % — ABNORMAL LOW (ref 36.0–46.0)
HEMOGLOBIN: 10.4 g/dL — AB (ref 12.0–15.0)
Immature Granulocytes: 2 %
LYMPHS ABS: 4 10*3/uL (ref 0.7–4.0)
LYMPHS PCT: 17 %
MCH: 31.4 pg (ref 26.0–34.0)
MCHC: 33 g/dL (ref 30.0–36.0)
MCV: 95.2 fL (ref 80.0–100.0)
Monocytes Absolute: 1.4 10*3/uL — ABNORMAL HIGH (ref 0.1–1.0)
Monocytes Relative: 6 %
NEUTROS PCT: 75 %
NRBC: 0.1 % (ref 0.0–0.2)
Neutro Abs: 18.2 10*3/uL — ABNORMAL HIGH (ref 1.7–7.7)
Platelets: 260 10*3/uL (ref 150–400)
RBC: 3.31 MIL/uL — ABNORMAL LOW (ref 3.87–5.11)
RDW: 15.8 % — ABNORMAL HIGH (ref 11.5–15.5)
WBC: 24.2 10*3/uL — ABNORMAL HIGH (ref 4.0–10.5)

## 2018-03-04 LAB — CBG MONITORING, ED: GLUCOSE-CAPILLARY: 107 mg/dL — AB (ref 70–99)

## 2018-03-04 LAB — I-STAT CG4 LACTIC ACID, ED
LACTIC ACID, VENOUS: 3.08 mmol/L — AB (ref 0.5–1.9)
Lactic Acid, Venous: 3.03 mmol/L (ref 0.5–1.9)

## 2018-03-04 LAB — PROCALCITONIN: PROCALCITONIN: 24.42 ng/mL

## 2018-03-04 LAB — CBC
HEMATOCRIT: 35.8 % — AB (ref 36.0–46.0)
Hemoglobin: 12.3 g/dL (ref 12.0–15.0)
MCH: 31 pg (ref 26.0–34.0)
MCHC: 34.4 g/dL (ref 30.0–36.0)
MCV: 90.2 fL (ref 80.0–100.0)
PLATELETS: 304 10*3/uL (ref 150–400)
RBC: 3.97 MIL/uL (ref 3.87–5.11)
RDW: 15.2 % (ref 11.5–15.5)
WBC: 30.2 10*3/uL — ABNORMAL HIGH (ref 4.0–10.5)
nRBC: 0 % (ref 0.0–0.2)

## 2018-03-04 LAB — LACTIC ACID, PLASMA
LACTIC ACID, VENOUS: 4.8 mmol/L — AB (ref 0.5–1.9)
Lactic Acid, Venous: 6.7 mmol/L (ref 0.5–1.9)

## 2018-03-04 LAB — BODY FLUID CELL COUNT WITH DIFFERENTIAL
LYMPHS FL: 5 %
MONOCYTE-MACROPHAGE-SEROUS FLUID: 36 % — AB (ref 50–90)
Neutrophil Count, Fluid: 59 % — ABNORMAL HIGH (ref 0–25)
Total Nucleated Cell Count, Fluid: 8700 cu mm — ABNORMAL HIGH (ref 0–1000)

## 2018-03-04 LAB — LIPASE, BLOOD: Lipase: 20 U/L (ref 11–51)

## 2018-03-04 LAB — PROTIME-INR
INR: 1.95
Prothrombin Time: 22 seconds — ABNORMAL HIGH (ref 11.4–15.2)

## 2018-03-04 LAB — AMYLASE: Amylase: 59 U/L (ref 28–100)

## 2018-03-04 LAB — MRSA PCR SCREENING: MRSA by PCR: NEGATIVE

## 2018-03-04 LAB — OCCULT BLOOD X 1 CARD TO LAB, STOOL: FECAL OCCULT BLD: NEGATIVE

## 2018-03-04 LAB — MAGNESIUM: Magnesium: 1.5 mg/dL — ABNORMAL LOW (ref 1.7–2.4)

## 2018-03-04 LAB — LACTATE DEHYDROGENASE: LDH: 179 U/L (ref 98–192)

## 2018-03-04 LAB — PHOSPHORUS: PHOSPHORUS: 4.5 mg/dL (ref 2.5–4.6)

## 2018-03-04 LAB — CHOLESTEROL, TOTAL: Cholesterol: 77 mg/dL (ref 0–200)

## 2018-03-04 LAB — CORTISOL: Cortisol, Plasma: 75.5 ug/dL

## 2018-03-04 MED ORDER — SODIUM CHLORIDE 0.9 % IV BOLUS
1000.0000 mL | Freq: Once | INTRAVENOUS | Status: AC
  Administered 2018-03-04: 1000 mL via INTRAVENOUS

## 2018-03-04 MED ORDER — MIDAZOLAM HCL 2 MG/2ML IJ SOLN
INTRAMUSCULAR | Status: AC
  Filled 2018-03-04: qty 2

## 2018-03-04 MED ORDER — LIDOCAINE HCL 1 % IJ SOLN
5.0000 mL | Freq: Once | INTRAMUSCULAR | Status: AC
  Administered 2018-03-04: 5 mL via INTRADERMAL

## 2018-03-04 MED ORDER — ETOMIDATE 2 MG/ML IV SOLN
10.0000 mg | Freq: Once | INTRAVENOUS | Status: AC
  Administered 2018-03-04 (×2): 10 mg via INTRAVENOUS

## 2018-03-04 MED ORDER — FENTANYL BOLUS VIA INFUSION
25.0000 ug | INTRAVENOUS | Status: DC | PRN
  Administered 2018-03-05 – 2018-03-07 (×5): 25 ug via INTRAVENOUS
  Filled 2018-03-04: qty 25

## 2018-03-04 MED ORDER — FENTANYL CITRATE (PF) 100 MCG/2ML IJ SOLN
100.0000 ug | Freq: Once | INTRAMUSCULAR | Status: AC
  Administered 2018-03-04: 100 ug via INTRAVENOUS

## 2018-03-04 MED ORDER — LACTATED RINGERS IV BOLUS (SEPSIS)
1000.0000 mL | Freq: Once | INTRAVENOUS | Status: AC
  Administered 2018-03-04: 1000 mL via INTRAVENOUS

## 2018-03-04 MED ORDER — LIDOCAINE HCL (PF) 1 % IJ SOLN
INTRAMUSCULAR | Status: AC
  Administered 2018-03-04: 5 mL
  Filled 2018-03-04: qty 5

## 2018-03-04 MED ORDER — SODIUM CHLORIDE 0.9 % IV SOLN
INTRAVENOUS | Status: DC
  Administered 2018-03-04: 16:00:00 via INTRAVENOUS

## 2018-03-04 MED ORDER — ROCURONIUM BROMIDE 50 MG/5ML IV SOLN
1.0000 mg/kg | Freq: Once | INTRAVENOUS | Status: AC
  Administered 2018-03-04: 50 mg via INTRAVENOUS

## 2018-03-04 MED ORDER — VASOPRESSIN 20 UNIT/ML IV SOLN
0.0300 [IU]/min | INTRAVENOUS | Status: DC
  Administered 2018-03-04: 0.03 [IU]/min via INTRAVENOUS
  Filled 2018-03-04 (×2): qty 2

## 2018-03-04 MED ORDER — LACTATED RINGERS IV BOLUS
1000.0000 mL | Freq: Once | INTRAVENOUS | Status: AC
  Administered 2018-03-04: 1000 mL via INTRAVENOUS

## 2018-03-04 MED ORDER — FENTANYL CITRATE (PF) 100 MCG/2ML IJ SOLN
INTRAMUSCULAR | Status: AC
  Administered 2018-03-04: 100 ug
  Filled 2018-03-04: qty 2

## 2018-03-04 MED ORDER — NOREPINEPHRINE 4 MG/250ML-% IV SOLN
INTRAVENOUS | Status: AC
  Filled 2018-03-04: qty 250

## 2018-03-04 MED ORDER — FENTANYL 2500MCG IN NS 250ML (10MCG/ML) PREMIX INFUSION
25.0000 ug/h | INTRAVENOUS | Status: DC
  Administered 2018-03-05: 50 ug/h via INTRAVENOUS
  Administered 2018-03-06: 100 ug/h via INTRAVENOUS
  Administered 2018-03-07: 80 ug/h via INTRAVENOUS
  Filled 2018-03-04 (×3): qty 250

## 2018-03-04 MED ORDER — FAMOTIDINE IN NACL 20-0.9 MG/50ML-% IV SOLN
20.0000 mg | Freq: Two times a day (BID) | INTRAVENOUS | Status: DC
  Administered 2018-03-04: 20 mg via INTRAVENOUS
  Filled 2018-03-04 (×2): qty 50

## 2018-03-04 MED ORDER — FENTANYL CITRATE (PF) 100 MCG/2ML IJ SOLN
INTRAMUSCULAR | Status: AC
  Filled 2018-03-04: qty 2

## 2018-03-04 MED ORDER — NOREPINEPHRINE 4 MG/250ML-% IV SOLN: 5.0000 ug/min | INTRAVENOUS | Status: DC

## 2018-03-04 MED ORDER — MIDAZOLAM HCL 2 MG/2ML IJ SOLN
INTRAMUSCULAR | Status: AC
  Administered 2018-03-04: 2 mg
  Filled 2018-03-04: qty 2

## 2018-03-04 MED ORDER — NOREPINEPHRINE 4 MG/250ML-% IV SOLN
0.0000 ug/min | INTRAVENOUS | Status: DC
  Administered 2018-03-04: 10 ug/min via INTRAVENOUS

## 2018-03-04 MED ORDER — METRONIDAZOLE IN NACL 5-0.79 MG/ML-% IV SOLN
500.0000 mg | Freq: Three times a day (TID) | INTRAVENOUS | Status: DC
  Administered 2018-03-04: 500 mg via INTRAVENOUS
  Filled 2018-03-04 (×2): qty 100

## 2018-03-04 MED ORDER — LACTATED RINGERS IV BOLUS (SEPSIS)
500.0000 mL | Freq: Once | INTRAVENOUS | Status: AC
  Administered 2018-03-04: 500 mL via INTRAVENOUS

## 2018-03-04 MED ORDER — SODIUM CHLORIDE 0.9 % IV SOLN
250.0000 mL | INTRAVENOUS | Status: DC
  Administered 2018-03-04: 250 mL via INTRAVENOUS

## 2018-03-04 MED ORDER — SODIUM CHLORIDE 0.9 % IV SOLN
2.0000 g | Freq: Once | INTRAVENOUS | Status: AC
  Administered 2018-03-04: 2 g via INTRAVENOUS
  Filled 2018-03-04: qty 2

## 2018-03-04 MED ORDER — HYDROCORTISONE NA SUCCINATE PF 100 MG IJ SOLR
50.0000 mg | Freq: Four times a day (QID) | INTRAMUSCULAR | Status: DC
  Administered 2018-03-04 – 2018-03-07 (×11): 50 mg via INTRAVENOUS
  Filled 2018-03-04 (×11): qty 2

## 2018-03-04 MED ORDER — SODIUM CHLORIDE 0.9 % IV SOLN
INTRAVENOUS | Status: AC
  Administered 2018-03-04 – 2018-03-07 (×7): via INTRAVENOUS

## 2018-03-04 MED ORDER — PIPERACILLIN-TAZOBACTAM 3.375 G IVPB
3.3750 g | Freq: Three times a day (TID) | INTRAVENOUS | Status: DC
  Administered 2018-03-04 – 2018-03-07 (×8): 3.375 g via INTRAVENOUS
  Filled 2018-03-04 (×9): qty 50

## 2018-03-04 MED ORDER — ORAL CARE MOUTH RINSE
15.0000 mL | OROMUCOSAL | Status: DC
  Administered 2018-03-04 – 2018-03-08 (×40): 15 mL via OROMUCOSAL

## 2018-03-04 MED ORDER — DEXMEDETOMIDINE HCL IN NACL 400 MCG/100ML IV SOLN: 0.0000 ug/kg/h | INTRAVENOUS | Status: DC

## 2018-03-04 MED ORDER — NOREPINEPHRINE 16 MG/250ML-% IV SOLN
5.0000 ug/min | INTRAVENOUS | Status: DC
  Administered 2018-03-04: 15 ug/min via INTRAVENOUS
  Administered 2018-03-05 – 2018-03-06 (×2): 8 ug/min via INTRAVENOUS
  Filled 2018-03-04 (×3): qty 250

## 2018-03-04 MED ORDER — FENTANYL CITRATE (PF) 100 MCG/2ML IJ SOLN: 50.0000 ug | Freq: Once | INTRAMUSCULAR | Status: DC

## 2018-03-04 MED ORDER — CHLORHEXIDINE GLUCONATE 0.12% ORAL RINSE (MEDLINE KIT)
15.0000 mL | Freq: Two times a day (BID) | OROMUCOSAL | Status: DC
  Administered 2018-03-04 – 2018-03-08 (×9): 15 mL via OROMUCOSAL

## 2018-03-04 MED ORDER — VANCOMYCIN HCL 500 MG IV SOLR
500.0000 mg | INTRAVENOUS | Status: DC
  Administered 2018-03-05 – 2018-03-06 (×2): 500 mg via INTRAVENOUS
  Filled 2018-03-04 (×3): qty 500

## 2018-03-04 MED ORDER — VANCOMYCIN HCL IN DEXTROSE 1-5 GM/200ML-% IV SOLN
1000.0000 mg | Freq: Once | INTRAVENOUS | Status: AC
  Administered 2018-03-04: 1000 mg via INTRAVENOUS
  Filled 2018-03-04: qty 200

## 2018-03-04 NOTE — H&P (Signed)
NAME:  Maria Avila, MRN:  [id number], DOB:  [birth year], LOS: 0 ADMISSION DATE:  03/04/2018, CONSULTATION DATE:  03/04/2018 REFERRING MD:  Dr. Vanita Panda, CHIEF COMPLAINT:  AMS  Brief History   67 year old female presenting from [facility] for 2 days history of being less responsive than her baseline.       Recent hospitalization 10/15 - 10/26 for failure to thrive, pancytopenia, abdominal pain, and inability to tolerate oral diet.  Underwent EGD 10/25 with findings of normal esophagus, acute gastritis, and colonoscopy 10/25 which diverticulosis throughout.   Noted to be hypoglycemic, hypoxic  Past Medical History  DMT2, HTN, failure to thrive  Significant Hospital Events   10/31 Admitted  Consults: date of consult/date signed off & final recs:  TCTS 10/31  Procedures (surgical and bedside):  10/31 ETT 10/31   Significant Diagnostic Tests:  Chest CT 10/31 with effusion and air in the pleural space with atelectasis and infiltrate  Micro Data:  Blood 10/31>>> Urine 10/31>>> Sputum 10/31>>>  Antimicrobials:  Vancomycin 10/31>>> Zosyn 10/31>>>   Subjective:  Altered  Objective   Blood pressure (!) 133/117, pulse (!) 115, temperature 99.9 F (37.7 C), temperature source Rectal, resp. rate (!) 27, height 5\' 4"  (1.626 m), weight 54.7 kg, SpO2 96 %.        Intake/Output Summary (Last 24 hours) at 03/04/2018 1458 Last data filed at 03/04/2018 1338 Gross per 24 hour  Intake 2800 ml  Output -  Net 2800 ml   Filed Weights   03/04/18 1448  Weight: 54.7 kg    Examination: General: Acutely on chronically ill appearing female, in and out of consciousness HENT: [place]/AT, PERRL, EOM-I and MMM Lungs: Decreased BS diffusely with decreased on the left Cardiovascular: Irregular tachy, Nl S1/S2 and -M/R/G Abdomen: Soft, NT, ND and +BS Extremities: -edema and -tenderness Neuro: Altered, moving all ext spontaneously Skin: Intact  Resolved Hospital Problem list     N/A  Assessment & Plan:  67 year old female patient that is acutely ill from an empyema.  The patient presented from SNF with septic shock and respiratory failure with chest CT that I reviewed myself that showed free air and large amount of fluid with a recent EGD there is a concern esophageal rupture and empyema.  Discussed with PCCM-NP.  VDRF:  - Intubate  - Full vent support  - ABG  - CXR  - Adjust vent for ABG  - Titrate O2 for sat of 88-92%  Septic shock:  - Cortisol   - Levophed  - CVP  - Stress dose steroids  Empyema:  - CT placement  - Abx  - Pan culture  - CVTS consulted and evaluated patient  - Fluid analysis  HCAP:  - Vanc  - Zosyn  - Pan culture  Sedation:   - Precedex  - Fentanyl drip  GOC:  - LCB with no CPR/cardioversion   Disposition / Summary of Today's Plan 03/04/18   See above     Labs   CBC: Recent Labs  Lab 02/26/18 0519 02/27/18 0423 03/04/18 0851  WBC 8.2 9.8 24.2*  NEUTROABS  --   --  18.2*  HGB 12.0 12.0 10.4*  HCT 34.1* 34.8* 31.5*  MCV 88.3 89.2 95.2  PLT 263 324 696    Basic Metabolic Panel: Recent Labs  Lab 02/26/18 0519 02/27/18 0423 03/04/18 0851  NA 141  --  139  K 3.8  --  3.8  CL 110  --  111  CO2 21*  --  18*  GLUCOSE 69*  --  111*  BUN <5*  --  29*  CREATININE 0.59  --  1.96*  CALCIUM 9.1  --  7.9*  MG 1.6* 1.4*  --    GFR: Estimated Creatinine Clearance: 23.7 mL/min (A) (by C-G formula based on SCr of 1.96 mg/dL (H)). Recent Labs  Lab 02/26/18 0519 02/27/18 0423 03/04/18 0851 03/04/18 0923 03/04/18 1238  WBC 8.2 9.8 24.2*  --   --   LATICACIDVEN  --   --   --  3.08* 3.03*    Liver Function Tests: Recent Labs  Lab 02/26/18 0519 03/04/18 0851  AST 29 31  ALT 32 24  ALKPHOS 117 110  BILITOT 1.4* 1.5*  PROT 4.9* 4.1*  ALBUMIN 1.9* 1.3*   No results for input(s): LIPASE, AMYLASE in the last 168 hours. No results for input(s): AMMONIA in the last 168 hours.  ABG No results found  for: PHART, PCO2ART, PO2ART, HCO3, TCO2, ACIDBASEDEF, O2SAT   Coagulation Profile: No results for input(s): INR, PROTIME in the last 168 hours.  Cardiac Enzymes: No results for input(s): CKTOTAL, CKMB, CKMBINDEX, TROPONINI in the last 168 hours.  HbA1C: No results found for: HGBA1C  CBG: Recent Labs  Lab 03/04/18 0907  GLUCAP 107*    Admitting History of Present Illness.   67 year old female presenting from [facility] for 2 days history of being less responsive than her baseline.       Recent hospitalization 10/15 - 10/26 for failure to thrive, pancytopenia, abdominal pain, and inability to tolerate oral diet.  Underwent EGD 10/25 with findings of normal esophagus, acute gastritis, and colonoscopy 10/25 which diverticulosis throughout.   Noted to be hypoglycemic, hypoxic  Review of Systems:   Unattainable  Past Medical History  She,  has a past medical history of Colitis, Diabetes mellitus without complication ([place]), Hypertension, and Malnutrition ([place]).   Surgical History    Past Surgical History:  Procedure Laterality Date  . BIOPSY  02/26/2018   Procedure: BIOPSY;  Surgeon: Wilford Corner, MD;  Location: [place];  Service: Endoscopy;;  . COLONOSCOPY WITH PROPOFOL N/A 02/26/2018   Procedure: COLONOSCOPY WITH PROPOFOL;  Surgeon: Wilford Corner, MD;  Location: [place];  Service: Endoscopy;  Laterality: N/A;  . ESOPHAGOGASTRODUODENOSCOPY (EGD) WITH PROPOFOL N/A 02/26/2018   Procedure: ESOPHAGOGASTRODUODENOSCOPY (EGD) WITH PROPOFOL;  Surgeon: Wilford Corner, MD;  Location: [place];  Service: Endoscopy;  Laterality: N/A;  . NO PAST SURGERIES     UNCERTAIN OF NAME OF SURGERY     Social History   Social History   Socioeconomic History  . Marital status: Married    Spouse name: Not on file  . Number of children: Not on file  . Years of education: Not on file  . Highest education level: Not on file  Occupational History  . Not on  file  Social Needs  . Financial resource strain: Not on file  . Food insecurity:    Worry: Not on file    Inability: Not on file  . Transportation needs:    Medical: Not on file    Non-medical: Not on file  Tobacco Use  . Smoking status: Former Research scientist (life sciences)  . Smokeless tobacco: Never Used  Substance and Sexual Activity  . Alcohol use: Not Currently  . Drug use: Not Currently  . Sexual activity: Not on file  Lifestyle  . Physical activity:    Days per week: Not on file  Minutes per session: Not on file  . Stress: Not on file  Relationships  . Social connections:    Talks on phone: Not on file    Gets together: Not on file    Attends religious service: Not on file    Active member of club or organization: Not on file    Attends meetings of clubs or organizations: Not on file    Relationship status: Not on file  . Intimate partner violence:    Fear of current or ex partner: Not on file    Emotionally abused: Not on file    Physically abused: Not on file    Forced sexual activity: Not on file  Other Topics Concern  . Not on file  Social History Narrative  . Not on file  ,  reports that she has quit smoking. She has never used smokeless tobacco. She reports that she drank alcohol. She reports that she has current or past drug history.   Family History   Her family history includes Alzheimer's disease in her mother; Colon cancer in her father.   Allergies Allergies  Allergen Reactions  . Crestor [Rosuvastatin Calcium] Swelling and Other (See Comments)    Swelling of legs and muscle weakness  . Zocor [Simvastatin] Swelling and Other (See Comments)    Swelling of legs and muscle weakness     Home Medications  Prior to Admission medications   Medication Sig Start Date End Date Taking? Authorizing Provider  aspirin EC 81 MG tablet Take 81 mg by mouth daily. 07/21/17  Yes [provider]  Cholecalciferol (VITAMIN D) 2000 units CAPS Take 2,000 Units by mouth daily.   07/21/17  Yes [provider]  feeding supplement, ENSURE ENLIVE, (ENSURE ENLIVE) LIQD Take 237 mLs by mouth 3 (three) times daily between meals. 02/27/18  Yes Amin, Jeanella Flattery, MD  lipase/protease/amylase 24000-76000 units CPEP Take 1 capsule (24,000 Units total) by mouth 3 (three) times daily with meals. 02/18/18  Yes Black, Lezlie Octave, NP  lisinopril (PRINIVIL,ZESTRIL) 10 MG tablet Take 10 mg by mouth daily. 07/16/17  Yes [provider]  mirtazapine (REMERON) 7.5 MG tablet Take 1 tablet (7.5 mg total) by mouth at bedtime. 02/18/18  Yes Black, Lezlie Octave, NP  Multiple Vitamin (MULTIVITAMIN) capsule Take 1 capsule by mouth daily. 07/21/17  Yes [provider]  naproxen sodium (ALEVE) 220 MG tablet Take 220 mg by mouth 2 (two) times daily as needed. 07/21/17  Yes [provider]  Omega-3 Fatty Acids (FISH OIL) 1000 MG CAPS Take 1,000 mg by mouth daily.  07/21/17  Yes [provider]  ondansetron (ZOFRAN ODT) 4 MG disintegrating tablet Take 1 tablet (4 mg total) by mouth every 6 (six) hours as needed. Patient taking differently: Take 4 mg by mouth every 6 (six) hours as needed for nausea or vomiting.  02/01/18  Yes Ward, Cyril Mourning N, DO  pantoprazole (PROTONIX) 40 MG tablet Take 40 mg by mouth daily. 07/21/17  Yes [provider]  pravastatin (PRAVACHOL) 40 MG tablet Take 40 mg by mouth every evening. 12/20/17  Yes [provider]  promethazine (PHENERGAN) 12.5 MG tablet Take 12.5 mg by mouth every 6 (six) hours as needed for nausea. 02/10/18  Yes [provider]  thiamine 100 MG tablet Take 1 tablet (100 mg total) by mouth daily. Patient not taking: Reported on 03/04/2018 02/28/18 03/30/18  Damita Lack, MD    The patient is critically ill with multiple organ systems failure and requires  high complexity decision making for assessment and support, frequent evaluation and titration of therapies, application of advanced monitoring  technologies and extensive interpretation of multiple databases.   Critical Care Time devoted to patient care services described in this note is  90  Minutes. This time reflects time of care of this signee Dr Jennet Maduro. This critical care time does not reflect procedure time, or teaching time or supervisory time of PA/NP/Med student/Med Resident etc but could involve care discussion time.  [name], M.D. [facility] Pulmonary/Critical Care Medicine. Pager: [phone]. After hours pager: [phone].

## 2018-03-04 NOTE — Procedures (Signed)
Central Venous Catheter Insertion Procedure Note Maria Avila [id number] 29-Jul-1950  Procedure: Insertion of Central Venous Catheter Indications: Assessment of intravascular volume, Drug and/or fluid administration and Frequent blood sampling  Procedure Details Consent: Risks of procedure as well as the alternatives and risks of each were explained to the (patient/caregiver).  Consent for procedure obtained. Time Out: Verified patient identification, verified procedure, site/side was marked, verified correct patient position, special equipment/implants available, medications/allergies/relevent history reviewed, required imaging and test results available.  Performed  Maximum sterile technique was used including antiseptics, cap, gloves, gown, hand hygiene, mask and sheet. Skin prep: Chlorhexidine; local anesthetic administered A antimicrobial bonded/coated triple lumen catheter was placed in the left subclavian vein using the Seldinger technique.  Evaluation Blood flow good Complications: No apparent complications Patient did tolerate procedure well. Chest X-ray ordered to verify placement.  CXR: pending.  U/S used in placement  YACOUB,WESAM 03/04/2018, 4:25 PM

## 2018-03-04 NOTE — ED Notes (Signed)
ED TO INPATIENT HANDOFF REPORT  Name/Age/Gender Maria Avila 67 y.o. female  Code Status Code Status History    Date Active Date Inactive Code Status Order ID Comments User Context   02/16/2018 0745 02/27/2018 1623 Full Code [id number]  Danford, Suann Larry, MD Inpatient      Home/SNF/Other Skilled nursing facility  Chief Complaint Altered Mental Status  Level of Care/Admitting Diagnosis ED Disposition    ED Disposition Condition [place]: [place] [100100]  Level of Care: ICU [6]  Diagnosis: Empyema [facility]) [081448]  Admitting Physician: Lady Saucier  Attending Physician: [name] [phone]  Estimated length of stay: 3 - 4 days  Certification:: I certify this patient will need inpatient services for at least 2 midnights  PT Class (Do Not Modify): Inpatient [101]  PT Acc Code (Do Not Modify): Private [1]       Medical History Past Medical History:  Diagnosis Date  . Colitis   . Diabetes mellitus without complication ([place])   . Hypertension   . Malnutrition ([place])     Allergies Allergies  Allergen Reactions  . Crestor [Rosuvastatin Calcium] Swelling and Other (See Comments)    Swelling of legs and muscle weakness  . Zocor [Simvastatin] Swelling and Other (See Comments)    Swelling of legs and muscle weakness    IV Location/Drains/Wounds Patient Lines/Drains/Airways Status   Active Line/Drains/Airways    Name:   Placement date:   Placement time:   Site:   Days:   Peripheral IV 03/04/18 Right   03/04/18    0845    -   less than 1   Wound / Incision (Open or Dehisced) 02/24/18 Other (Comment) Perineum raw open areas small amt bleeding   02/24/18    -    Perineum   8          Labs/Imaging Results for orders placed or performed during the hospital encounter of 03/04/18 (from the past 48 hour(s))  Comprehensive metabolic panel     Status: Abnormal   Collection Time: 03/04/18  8:51 AM  Result Value Ref  Range   Sodium 139 135 - 145 mmol/L   Potassium 3.8 3.5 - 5.1 mmol/L   Chloride 111 98 - 111 mmol/L   CO2 18 (L) 22 - 32 mmol/L   Glucose, Bld 111 (H) 70 - 99 mg/dL   BUN 29 (H) 8 - 23 mg/dL   Creatinine, Ser 1.96 (H) 0.44 - 1.00 mg/dL   Calcium 7.9 (L) 8.9 - 10.3 mg/dL   Total Protein 4.1 (L) 6.5 - 8.1 g/dL   Albumin 1.3 (L) 3.5 - 5.0 g/dL   AST 31 15 - 41 U/L   ALT 24 0 - 44 U/L   Alkaline Phosphatase 110 38 - 126 U/L   Total Bilirubin 1.5 (H) 0.3 - 1.2 mg/dL   GFR calc non Af Amer 25 (L) >60 mL/min   GFR calc Af Amer 29 (L) >60 mL/min    Comment: (NOTE) The eGFR has been calculated using the CKD EPI equation. This calculation has not been validated in all clinical situations. eGFR's persistently <60 mL/min signify possible Chronic Kidney Disease.    Anion gap 10 5 - 15    Comment: Performed at [facility], [place] [address], [place], [place] [address]  CBC WITH DIFFERENTIAL     Status: Abnormal   Collection Time: 03/04/18  8:51 AM  Result Value Ref Range  WBC 24.2 (H) 4.0 - 10.5 K/uL    Comment: WHITE COUNT CONFIRMED ON SMEAR   RBC 3.31 (L) 3.87 - 5.11 MIL/uL   Hemoglobin 10.4 (L) 12.0 - 15.0 g/dL   HCT 31.5 (L) 36.0 - 46.0 %   MCV 95.2 80.0 - 100.0 fL   MCH 31.4 26.0 - 34.0 pg   MCHC 33.0 30.0 - 36.0 g/dL   RDW 15.8 (H) 11.5 - 15.5 %   Platelets 260 150 - 400 K/uL   nRBC 0.1 0.0 - 0.2 %   Neutrophils Relative % 75 %   Neutro Abs 18.2 (H) 1.7 - 7.7 K/uL   Lymphocytes Relative 17 %   Lymphs Abs 4.0 0.7 - 4.0 K/uL   Monocytes Relative 6 %   Monocytes Absolute 1.4 (H) 0.1 - 1.0 K/uL   Eosinophils Relative 0 %   Eosinophils Absolute 0.0 0.0 - 0.5 K/uL   Basophils Relative 0 %   Basophils Absolute 0.0 0.0 - 0.1 K/uL   WBC Morphology MILD LEFT SHIFT (1-5% METAS, OCC MYELO, OCC BANDS)    Immature Granulocytes 2 %   Abs Immature Granulocytes 0.56 (H) 0.00 - 0.07 K/uL    Comment: Performed at [facility], [place] [address]  [place], [place] [address]  Occult blood card to lab, stool     Status: None   Collection Time: 03/04/18  9:00 AM  Result Value Ref Range   Fecal Occult Bld NEGATIVE NEGATIVE    Comment: Performed at [facility] [place] [address], [place], [place] [address]  CBG monitoring, ED     Status: Abnormal   Collection Time: 03/04/18  9:07 AM  Result Value Ref Range   Glucose-Capillary 107 (H) 70 - 99 mg/dL   Comment 1 Notify RN    Comment 2 Document in Chart   I-Stat CG4 Lactic Acid, ED  (not at  [facility])     Status: Abnormal   Collection Time: 03/04/18  9:23 AM  Result Value Ref Range   Lactic Acid, Venous 3.08 (HH) 0.5 - 1.9 mmol/L   Comment NOTIFIED PHYSICIAN    Ct Head Wo Contrast  Result Date: 03/04/2018 CLINICAL DATA:  Altered level of consciousness EXAM: CT HEAD WITHOUT CONTRAST TECHNIQUE: Contiguous axial images were obtained from the base of the skull through the vertex without intravenous contrast. COMPARISON:  02/16/2018 FINDINGS: Brain: No evidence of acute infarction, hemorrhage, hydrocephalus, extra-axial collection or mass lesion/mass effect. Vascular: No hyperdense vessel or unexpected calcification. Skull: Normal. Negative for fracture or focal lesion. Sinuses/Orbits: No acute finding. Other: None. IMPRESSION: No acute abnormality noted.  No change from the prior exam. Electronically Signed   By: Inez Catalina M.D.   On: 03/04/2018 09:33   Ct Chest Wo Contrast  Result Date: 03/04/2018 CLINICAL DATA:  Shortness of breath, pneumonia EXAM: CT CHEST WITHOUT CONTRAST TECHNIQUE: Multidetector CT imaging of the chest was performed following the standard protocol without IV contrast. COMPARISON:  Chest x-ray 03/04/2018 FINDINGS: Cardiovascular: Heart is normal size. Aorta is normal caliber with scattered aortic calcifications. Moderate coronary artery calcifications in the left anterior descending coronary artery. Mediastinum/Nodes: Reactive borderline sized mediastinal lymph  nodes. Lungs/Pleura: Complex large left pleural effusions with loculation and pleural gas. Cannot exclude bronchopleural fistula or empyema. Collapse of much of the left lung with only a small amount of aerated left upper lobe. Nodular airspace opacities in the right upper lobe and superior segment of the right lower lobe with trace right pleural effusion. Upper  Abdomen: Imaging into the upper abdomen shows no acute findings. Musculoskeletal: Chest wall soft tissues are unremarkable. Degenerative changes in the thoracic spine. No acute bony abnormality. IMPRESSION: Large complex left pleural effusion with pleural gas and extensive loculations. This could reflect bronchopleural fistula or empyema. Collapse of much of the left lung with only a small amount of aerated left upper lobe. Nodular ground-glass airspace opacities centrally in the right upper lobe and in the superior segment of the right lower lobe, likely infectious/inflammatory. Coronary artery disease, aortic atherosclerosis. Electronically Signed   By: Rolm Baptise M.D.   On: 03/04/2018 11:39   Dg Chest Port 1 View  Result Date: 03/04/2018 CLINICAL DATA:  67 year old female with altered mental status, not responding. EXAM: PORTABLE CHEST 1 VIEW COMPARISON:  Portable chest 02/22/2018. FINDINGS: Portable AP upright view at 0854 hours. Near complete opacification of the left hemithorax where as only mild left lung base opacity was present 10 days ago. No shift of the mediastinum. The right lung remains clear allowing for portable technique. Visualized tracheal air column is within normal limits. No pneumothorax. Paucity bowel gas in the upper abdomen. No acute osseous abnormality identified. IMPRESSION: New subtotal opacification of the left hemithorax is nonspecific but suspicious for combination of left lung consolidation and pleural effusion. The right lung remains clear. Electronically Signed   By: Genevie Ann M.D.   On: 03/04/2018 09:41   EKG  Interpretation  Date/Time:  Thursday March 04 2018 08:48:16 EDT Ventricular Rate:  128 PR Interval:    QRS Duration: 70 QT Interval:  337 QTC Calculation: 492 R Axis:   14 Text Interpretation:  Sinus tachycardia Atrial premature complexes Abnormal R-wave progression, early transition T wave abnormality Abnormal ekg Confirmed by Carmin Muskrat [phone]) on 03/04/2018 8:58:44 AM   Pending Labs Unresulted Labs (From admission, onward)    Start     Ordered   03/04/18 0910  Blood Culture (routine x 2)  BLOOD CULTURE X 2,   STAT     03/04/18 0910   03/04/18 0910  Urinalysis, Routine w reflex microscopic  STAT,   STAT     03/04/18 0910   03/04/18 0851  Urinalysis, Complete w Microscopic  STAT,   STAT     03/04/18 0850          Vitals/Pain Today's Vitals   03/04/18 1032 03/04/18 1100 03/04/18 1118 03/04/18 1235  BP: 97/71 117/86 117/86 119/81  Pulse: (!) 116 (!) 119 (!) 123 (!) 119  Resp: (!) 28 (!) 33 (!) 31 (!) 21  Temp:      TempSrc:      SpO2: 97% 97% 98% 96%  PainSc:        Isolation Precautions No active isolations  Medications Medications  sodium chloride 0.9 % bolus 1,000 mL (0 mLs Intravenous Stopped 03/04/18 1019)    And  0.9 %  sodium chloride infusion (has no administration in time range)  lactated ringers bolus 1,000 mL (0 mLs Intravenous Stopped 03/04/18 1220)    And  lactated ringers bolus 500 mL (500 mLs Intravenous New Bag/Given 03/04/18 1235)  metroNIDAZOLE (FLAGYL) IVPB 500 mg (0 mg Intravenous Stopped 03/04/18 1221)  vancomycin (VANCOCIN) IVPB 1000 mg/200 mL premix (1,000 mg Intravenous New Bag/Given 03/04/18 1234)  ceFEPIme (MAXIPIME) 2 g in sodium chloride 0.9 % 100 mL IVPB (0 g Intravenous Stopped 03/04/18 1000)    Mobility non-ambulatory

## 2018-03-04 NOTE — Progress Notes (Signed)
CRITICAL VALUE ALERT  Critical Value:  Lactic Acid 6.7  Date & Time Notified:  03/04/2018 @ 2347  Provider Notified: Warren Lacy provider, [place], RN  Orders Received/Actions taken: Received orders for albumin and BMET STAT. Will continue to monitor closely. Clint Bolder, RN 03/04/18 11:49 PM

## 2018-03-04 NOTE — Procedures (Signed)
Chest Tube Insertion Procedure Note  Indications:  Clinically significant Empyema  Pre-operative Diagnosis: Empyema  Post-operative Diagnosis: Empyema  Procedure Details  Informed consent was obtained for the procedure, including sedation.  Risks of lung perforation, hemorrhage, arrhythmia, and adverse drug reaction were discussed.   After sterile skin prep, using standard technique, a 42 French tube was placed in the left lateral 8 rib space.  Findings: 1L of purulent fluid obtained  Estimated Blood Loss:  Minimal         Specimens:  Sent purulent fluid              Complications:  None; patient tolerated the procedure well.         Disposition: ICU - intubated and critically ill.         Condition: stable  Attending Attestation: I was present and scrubbed for the entire procedure.  [name], M.D. [facility] Pulmonary/Critical Care Medicine. Pager: [phone]. After hours pager: [phone].

## 2018-03-04 NOTE — Procedures (Signed)
Intubation Procedure Note Maria Avila [id number] June 02, 1950  Procedure: Intubation Indications: Respiratory insufficiency  Procedure Details Consent: Risks of procedure as well as the alternatives and risks of each were explained to the (patient/caregiver).  Consent for procedure obtained. Time Out: Verified patient identification, verified procedure, site/side was marked, verified correct patient position, special equipment/implants available, medications/allergies/relevent history reviewed, required imaging and test results available.  Performed  Maximum sterile technique was used including gloves, hand hygiene and mask.  MAC    Evaluation Hemodynamic Status: BP stable throughout; O2 sats: stable throughout Patient's Current Condition: stable Complications: No apparent complications Patient did tolerate procedure well. Chest X-ray ordered to verify placement.  CXR: pending.   YACOUB,WESAM 03/04/2018

## 2018-03-04 NOTE — Progress Notes (Signed)
Pharmacy Antibiotic Note  Maria Avila is a 67 y.o. female admitted on 03/04/2018 with  an empyema and septic shock. Planning to continue broad spectrum antibiotics for HCAP. Also with AKI, SCr 0.5 > 1.9, eCrCl ~23 ml/min.   Plan: -Vancomycin 500 mg IV q24h -Zosyn 3.375 g IV q8h -Monitor renal fx, cultures, VT as needed   Height: 5\' 4"  (162.6 cm) Weight: 120 lb 9.5 oz (54.7 kg) IBW/kg (Calculated) : 54.7  Temp (24hrs), Avg:97.7 F (36.5 C), Min:95.4 F (35.2 C), Max:99.9 F (37.7 C)  Recent Labs  Lab 02/26/18 0519 02/27/18 0423 03/04/18 0851 03/04/18 0923 03/04/18 1238  WBC 8.2 9.8 24.2*  --   --   CREATININE 0.59  --  1.96*  --   --   LATICACIDVEN  --   --   --  3.08* 3.03*     Antimicrobials this admission: 10/31 flagyl x1 10/31 cefepime x1 10/31 vancomycin > 10/31 zosyn >  Dose adjustments this admission: N/A  Microbiology results: 10/31 mrsa pcr:  10/31 blood cx: 10/31 urine cx:   Harvel Quale 03/04/2018 4:03 PM

## 2018-03-04 NOTE — ED Notes (Signed)
Bed: WA02 Expected date: 03/04/18 Expected time: 8:38 AM Means of arrival: Ambulance Comments: EMS 67yo female AMS

## 2018-03-04 NOTE — ED Provider Notes (Signed)
[facility] DEPT Provider Note   CSN: [id number] Arrival date & time: 03/04/18  1607     History   Chief Complaint Chief Complaint  Patient presents with  . Altered Mental Status    HPI Maria Avila is a 67 y.o. female.  HPI Patient presents from nursing facility with staff concern of altered mental status. Patient herself is listless, does not speak either spontaneously, or with stimuli, level 5 caveat secondary to acuity of condition. Reportedly the patient has been in a nursing facility since discharge last week, and over the past 2 days has been less interactive than usual, without reported new fever, vomiting, falling.    Past Medical History:  Diagnosis Date  . Colitis   . Diabetes mellitus without complication ([place])   . Hypertension   . Malnutrition [facility])     Patient Active Problem List   Diagnosis Date Noted  . Diarrhea 02/26/2018  . Pancytopenia ([place]) 02/19/2018  . Hypotension 02/19/2018  . Malnutrition of moderate degree 02/17/2018  . Colitis 02/16/2018  . Weakness 02/16/2018  . Nausea vomiting and diarrhea 02/16/2018  . Diabetes ([place]) 02/16/2018  . Essential hypertension 02/16/2018  . Anemia 02/16/2018    Past Surgical History:  Procedure Laterality Date  . BIOPSY  02/26/2018   Procedure: BIOPSY;  Surgeon: Wilford Corner, MD;  Location: [facility];  Service: Endoscopy;;  . COLONOSCOPY WITH PROPOFOL N/A 02/26/2018   Procedure: COLONOSCOPY WITH PROPOFOL;  Surgeon: Wilford Corner, MD;  Location: [facility];  Service: Endoscopy;  Laterality: N/A;  . ESOPHAGOGASTRODUODENOSCOPY (EGD) WITH PROPOFOL N/A 02/26/2018   Procedure: ESOPHAGOGASTRODUODENOSCOPY (EGD) WITH PROPOFOL;  Surgeon: Wilford Corner, MD;  Location: [facility];  Service: Endoscopy;  Laterality: N/A;  . NO PAST SURGERIES     UNCERTAIN OF NAME OF SURGERY     OB History   None      Home Medications    Prior to Admission medications     Medication Sig Start Date End Date Taking? Authorizing Provider  aspirin EC 81 MG tablet Take 81 mg by mouth daily. 07/21/17   [provider]  Cholecalciferol (VITAMIN D) 2000 units CAPS Take 1 capsule by mouth daily. 07/21/17   [provider]  feeding supplement, ENSURE ENLIVE, (ENSURE ENLIVE) LIQD Take 237 mLs by mouth 3 (three) times daily between meals. 02/27/18   Amin, Jeanella Flattery, MD  lipase/protease/amylase 24000-76000 units CPEP Take 1 capsule (24,000 Units total) by mouth 3 (three) times daily with meals. 02/18/18   Black, Lezlie Octave, NP  lisinopril (PRINIVIL,ZESTRIL) 10 MG tablet Take 10 mg by mouth daily. 07/16/17   [provider]  mirtazapine (REMERON) 15 MG tablet Take 15 mg by mouth daily. 03/04/18   [provider]  mirtazapine (REMERON) 7.5 MG tablet Take 1 tablet (7.5 mg total) by mouth at bedtime. 02/18/18   Black, Lezlie Octave, NP  Multiple Vitamin (MULTIVITAMIN) capsule Take 1 capsule by mouth daily. 07/21/17   [provider]  naproxen sodium (ALEVE) 220 MG tablet Take 220 mg by mouth 2 (two) times daily as needed. 07/21/17   [provider]  Omega-3 Fatty Acids (FISH OIL) 1000 MG CAPS Take 1 capsule by mouth daily. 07/21/17   [provider]  ondansetron (ZOFRAN ODT) 4 MG disintegrating tablet Take 1 tablet (4 mg total) by mouth every 6 (six) hours as needed. 02/01/18   Ward, Delice Bison, DO  pantoprazole (PROTONIX) 40 MG tablet Take 40 mg by mouth daily. 07/21/17   [provider]  pravastatin (PRAVACHOL) 40 MG tablet Take 40 mg by mouth every evening. 12/20/17   [provider]  promethazine (PHENERGAN) 12.5 MG tablet Take 12.5 mg by mouth every 6 (six) hours as needed for nausea. 02/10/18   [provider]  thiamine 100 MG tablet Take 1 tablet (100 mg total) by mouth daily. 02/28/18 03/30/18  Damita Lack, MD    Family History Family History  Problem Relation Age of Onset  . Alzheimer's  disease Mother   . Colon cancer Father     Social History Social History   Tobacco Use  . Smoking status: Former Research scientist (life sciences)  . Smokeless tobacco: Never Used  Substance Use Topics  . Alcohol use: Not Currently  . Drug use: Not Currently     Allergies   Crestor [rosuvastatin calcium] and Zocor [simvastatin]   Review of Systems Review of Systems  Unable to perform ROS: Acuity of condition     Physical Exam Updated Vital Signs BP (!) 85/57 (BP Location: Right Leg)   Pulse (!) 137   Temp 99.9 F (37.7 C) (Rectal)   Resp (!) 32   SpO2 91%   Physical Exam  Constitutional: She appears listless.  Listless, ill-appearing elderly female responds to painful stimuli, though with unintelligible verbal responses  HENT:  Head: Normocephalic and atraumatic.  Eyes: Conjunctivae and EOM are normal.  Cardiovascular: Regular rhythm.  Tachycardia  Pulmonary/Chest:  Tachypnea, diminished breath sounds on the left side, hypoxia, requiring 3 L oxygen for appropriate saturation  Abdominal: She exhibits no distension. There is no guarding.  Musculoskeletal: She exhibits no edema or deformity.  Neurological: She appears listless. She displays atrophy.  Skin: Skin is warm and dry.  Psychiatric: Cognition and memory are impaired.  Nursing note and vitals reviewed.    ED Treatments / Results  Labs (all labs ordered are listed, but only abnormal results are displayed) Labs Reviewed  COMPREHENSIVE METABOLIC PANEL - Abnormal; Notable for the following components:      Result Value   CO2 18 (*)    Glucose, Bld 111 (*)    BUN 29 (*)    Creatinine, Ser 1.96 (*)    Calcium 7.9 (*)    Total Protein 4.1 (*)    Albumin 1.3 (*)    Total Bilirubin 1.5 (*)    GFR calc non Af Amer 25 (*)    GFR calc Af Amer 29 (*)    All other components within normal limits  CBC WITH DIFFERENTIAL/PLATELET - Abnormal; Notable for the following components:   WBC 24.2 (*)    RBC 3.31 (*)    Hemoglobin 10.4 (*)     HCT 31.5 (*)    RDW 15.8 (*)    Neutro Abs 18.2 (*)    Monocytes Absolute 1.4 (*)    Abs Immature Granulocytes 0.56 (*)    All other components within normal limits  CBG MONITORING, ED - Abnormal; Notable for the following components:   Glucose-Capillary 107 (*)    All other components within normal limits  I-STAT CG4 LACTIC ACID, ED - Abnormal; Notable for the following components:   Lactic Acid, Venous 3.08 (*)    All other components within normal limits  I-STAT CG4 LACTIC ACID, ED - Abnormal; Notable for the following components:   Lactic Acid, Venous 3.03 (*)    All other components within normal limits  CULTURE, BLOOD (ROUTINE X 2)  CULTURE, BLOOD (ROUTINE X 2)  OCCULT BLOOD X 1 CARD TO  LAB, STOOL  URINALYSIS, COMPLETE (UACMP) WITH MICROSCOPIC  URINALYSIS, ROUTINE W REFLEX MICROSCOPIC    EKG EKG Interpretation  Date/Time:  Thursday March 04 2018 08:48:16 EDT Ventricular Rate:  128 PR Interval:    QRS Duration: 70 QT Interval:  337 QTC Calculation: 492 R Axis:   14 Text Interpretation:  Sinus tachycardia Atrial premature complexes Abnormal R-wave progression, early transition T wave abnormality Abnormal ekg Confirmed by Carmin Muskrat [phone]) on 03/04/2018 8:58:44 AM   Radiology Ct Head Wo Contrast  Result Date: 03/04/2018 CLINICAL DATA:  Altered level of consciousness EXAM: CT HEAD WITHOUT CONTRAST TECHNIQUE: Contiguous axial images were obtained from the base of the skull through the vertex without intravenous contrast. COMPARISON:  02/16/2018 FINDINGS: Brain: No evidence of acute infarction, hemorrhage, hydrocephalus, extra-axial collection or mass lesion/mass effect. Vascular: No hyperdense vessel or unexpected calcification. Skull: Normal. Negative for fracture or focal lesion. Sinuses/Orbits: No acute finding. Other: None. IMPRESSION: No acute abnormality noted.  No change from the prior exam. Electronically Signed   By: Inez Catalina M.D.   On: 03/04/2018  09:33   Ct Chest Wo Contrast  Result Date: 03/04/2018 CLINICAL DATA:  Shortness of breath, pneumonia EXAM: CT CHEST WITHOUT CONTRAST TECHNIQUE: Multidetector CT imaging of the chest was performed following the standard protocol without IV contrast. COMPARISON:  Chest x-ray 03/04/2018 FINDINGS: Cardiovascular: Heart is normal size. Aorta is normal caliber with scattered aortic calcifications. Moderate coronary artery calcifications in the left anterior descending coronary artery. Mediastinum/Nodes: Reactive borderline sized mediastinal lymph nodes. Lungs/Pleura: Complex large left pleural effusions with loculation and pleural gas. Cannot exclude bronchopleural fistula or empyema. Collapse of much of the left lung with only a small amount of aerated left upper lobe. Nodular airspace opacities in the right upper lobe and superior segment of the right lower lobe with trace right pleural effusion. Upper Abdomen: Imaging into the upper abdomen shows no acute findings. Musculoskeletal: Chest wall soft tissues are unremarkable. Degenerative changes in the thoracic spine. No acute bony abnormality. IMPRESSION: Large complex left pleural effusion with pleural gas and extensive loculations. This could reflect bronchopleural fistula or empyema. Collapse of much of the left lung with only a small amount of aerated left upper lobe. Nodular ground-glass airspace opacities centrally in the right upper lobe and in the superior segment of the right lower lobe, likely infectious/inflammatory. Coronary artery disease, aortic atherosclerosis. Electronically Signed   By: Rolm Baptise M.D.   On: 03/04/2018 11:39   Dg Chest Port 1 View  Result Date: 03/04/2018 CLINICAL DATA:  67 year old female with altered mental status, not responding. EXAM: PORTABLE CHEST 1 VIEW COMPARISON:  Portable chest 02/22/2018. FINDINGS: Portable AP upright view at 0854 hours. Near complete opacification of the left hemithorax where as only mild left  lung base opacity was present 10 days ago. No shift of the mediastinum. The right lung remains clear allowing for portable technique. Visualized tracheal air column is within normal limits. No pneumothorax. Paucity bowel gas in the upper abdomen. No acute osseous abnormality identified. IMPRESSION: New subtotal opacification of the left hemithorax is nonspecific but suspicious for combination of left lung consolidation and pleural effusion. The right lung remains clear. Electronically Signed   By: Genevie Ann M.D.   On: 03/04/2018 09:41    Procedures Procedures (including critical care time)  Medications Ordered in ED Medications  sodium chloride 0.9 % bolus 1,000 mL (1,000 mLs Intravenous New Bag/Given 03/04/18 0917)    And  0.9 %  sodium chloride infusion (  has no administration in time range)  lactated ringers bolus 1,000 mL (has no administration in time range)    And  lactated ringers bolus 500 mL (has no administration in time range)  ceFEPIme (MAXIPIME) 2 g in sodium chloride 0.9 % 100 mL IVPB (2 g Intravenous New Bag/Given 03/04/18 0938)  metroNIDAZOLE (FLAGYL) IVPB 500 mg (has no administration in time range)  vancomycin (VANCOCIN) IVPB 1000 mg/200 mL premix (has no administration in time range)     Initial Impression / Assessment and Plan / ED Course  I have reviewed the triage vital signs and the nursing notes.  Pertinent labs & imaging results that were available during my care of the patient were reviewed by me and considered in my medical decision making (see chart for details).  Immediately after initial evaluation with concern for hypotension, hypoxia, altered mental status, patient had empiric antibiotics for sepsis. Chart review performed afterwards notable for discharge last week after an episode of hypotension.  On repeat exam the patient remains tachypneic, tachycardic, though with protected airway. She is now accompanied by her husband. He notes that the patient was  generally improving after recent hospitalization, but over the past day or so has declined notably, and is currently in her state of illness.  Initial labs notable for lactic acidosis, x-ray consistent with opacification of the left hemithorax concerning for, gated pneumonia, CT scan ordered.  Update: CT concerning for possible air, empyema, loculated infection. Patient has received antibiotics, has received fluid resuscitation, blood pressure has improved.  Update: I discussed patient case with our critical care colleagues, and given concern for complicated infection, concern for sepsis, the patient will require transfer to our affiliate hospital, ICU.  Final Clinical Impressions(s) / ED Diagnoses  Sepsis Empyema   CRITICAL CARE Performed by: Carmin Muskrat Total critical care time: 45 minutes Critical care time was exclusive of separately billable procedures and treating other patients. Critical care was necessary to treat or prevent imminent or life-threatening deterioration. Critical care was time spent personally by me on the following activities: development of treatment plan with patient and/or surrogate as well as nursing, discussions with consultants, evaluation of patient's response to treatment, examination of patient, obtaining history from patient or surrogate, ordering and performing treatments and interventions, ordering and review of laboratory studies, ordering and review of radiographic studies, pulse oximetry and re-evaluation of patient's condition.  Final diagnoses:  None    ED Discharge Orders    None       Carmin Muskrat, MD 03/04/18 1252

## 2018-03-04 NOTE — Consult Note (Signed)
Reason for Consult:Left empyema Referring Physician: Dr. Yacoub  Maria Avila is an 67 y.o. female.  HPI: Maria Avila is a 67 yo woman who presented with altered mental status  Maria Avila is a 67 yo woman with a past history of colitis, type II diabetes and malnutrition. She was recently hospitalized with failure to thrive, inability to tolerate oral diet and pancytopenia. Had extensive Gi workup including endoscopy and colonoscopy. No underlying cause identified. She was discharged to [facility] on 02/27/18. Brought to ED today with 2 days of altered mental status. She was found to be hypoglycemic and hypoxic. Also tachycardic. CXR showed near complete opacification of left chest. CT showed a complex left pleural effusion and air in pleural space.  Intubated and chest tube placed by Dr. Nelda Marseille. Currently sedated.  Past Medical History:  Diagnosis Date  . Colitis   . Diabetes mellitus without complication ([place])   . Hypertension   . Malnutrition ([place])     Past Surgical History:  Procedure Laterality Date  . BIOPSY  02/26/2018   Procedure: BIOPSY;  Surgeon: Wilford Corner, MD;  Location: [place];  Service: Endoscopy;;  . COLONOSCOPY WITH PROPOFOL N/A 02/26/2018   Procedure: COLONOSCOPY WITH PROPOFOL;  Surgeon: Wilford Corner, MD;  Location: [place];  Service: Endoscopy;  Laterality: N/A;  . ESOPHAGOGASTRODUODENOSCOPY (EGD) WITH PROPOFOL N/A 02/26/2018   Procedure: ESOPHAGOGASTRODUODENOSCOPY (EGD) WITH PROPOFOL;  Surgeon: Wilford Corner, MD;  Location: [place];  Service: Endoscopy;  Laterality: N/A;  . NO PAST SURGERIES     UNCERTAIN OF NAME OF SURGERY    Family History  Problem Relation Age of Onset  . Alzheimer's disease Mother   . Colon cancer Father     Social History:  reports that she has quit smoking. She has never used smokeless tobacco. She reports that she drank alcohol. She reports that she has current or past drug  history.  Allergies:  Allergies  Allergen Reactions  . Crestor [Rosuvastatin Calcium] Swelling and Other (See Comments)    Swelling of legs and muscle weakness  . Zocor [Simvastatin] Swelling and Other (See Comments)    Swelling of legs and muscle weakness    Medications:  Scheduled: . fentaNYL      . fentaNYL (SUBLIMAZE) injection  50 mcg Intravenous Once  . hydrocortisone sodium succinate  50 mg Intravenous Q6H  . midazolam        Results for orders placed or performed during the hospital encounter of 03/04/18 (from the past 48 hour(s))  Comprehensive metabolic panel     Status: Abnormal   Collection Time: 03/04/18  8:51 AM  Result Value Ref Range   Sodium 139 135 - 145 mmol/L   Potassium 3.8 3.5 - 5.1 mmol/L   Chloride 111 98 - 111 mmol/L   CO2 18 (L) 22 - 32 mmol/L   Glucose, Bld 111 (H) 70 - 99 mg/dL   BUN 29 (H) 8 - 23 mg/dL   Creatinine, Ser 1.96 (H) 0.44 - 1.00 mg/dL   Calcium 7.9 (L) 8.9 - 10.3 mg/dL   Total Protein 4.1 (L) 6.5 - 8.1 g/dL   Albumin 1.3 (L) 3.5 - 5.0 g/dL   AST 31 15 - 41 U/L   ALT 24 0 - 44 U/L   Alkaline Phosphatase 110 38 - 126 U/L   Total Bilirubin 1.5 (H) 0.3 - 1.2 mg/dL   GFR calc non Af Amer 25 (L) >60 mL/min   GFR calc Af Amer 29 (L) >60 mL/min  Comment: (NOTE) The eGFR has been calculated using the CKD EPI equation. This calculation has not been validated in all clinical situations. eGFR's persistently <60 mL/min signify possible Chronic Kidney Disease.    Anion gap 10 5 - 15    Comment: Performed at [facility], [place] [address], [place], [place] [address]  CBC WITH DIFFERENTIAL     Status: Abnormal   Collection Time: 03/04/18  8:51 AM  Result Value Ref Range   WBC 24.2 (H) 4.0 - 10.5 K/uL    Comment: WHITE COUNT CONFIRMED ON SMEAR   RBC 3.31 (L) 3.87 - 5.11 MIL/uL   Hemoglobin 10.4 (L) 12.0 - 15.0 g/dL   HCT 31.5 (L) 36.0 - 46.0 %   MCV 95.2 80.0 - 100.0 fL   MCH 31.4 26.0 - 34.0 pg   MCHC 33.0 30.0 -  36.0 g/dL   RDW 15.8 (H) 11.5 - 15.5 %   Platelets 260 150 - 400 K/uL   nRBC 0.1 0.0 - 0.2 %   Neutrophils Relative % 75 %   Neutro Abs 18.2 (H) 1.7 - 7.7 K/uL   Lymphocytes Relative 17 %   Lymphs Abs 4.0 0.7 - 4.0 K/uL   Monocytes Relative 6 %   Monocytes Absolute 1.4 (H) 0.1 - 1.0 K/uL   Eosinophils Relative 0 %   Eosinophils Absolute 0.0 0.0 - 0.5 K/uL   Basophils Relative 0 %   Basophils Absolute 0.0 0.0 - 0.1 K/uL   WBC Morphology MILD LEFT SHIFT (1-5% METAS, OCC MYELO, OCC BANDS)    Immature Granulocytes 2 %   Abs Immature Granulocytes 0.56 (H) 0.00 - 0.07 K/uL    Comment: Performed at [facility], [place] [address], [place], [place] [address]  Occult blood card to lab, stool     Status: None   Collection Time: 03/04/18  9:00 AM  Result Value Ref Range   Fecal Occult Bld NEGATIVE NEGATIVE    Comment: Performed at [facility], [place] [address] [place], [place] [address]  CBG monitoring, ED     Status: Abnormal   Collection Time: 03/04/18  9:07 AM  Result Value Ref Range   Glucose-Capillary 107 (H) 70 - 99 mg/dL   Comment 1 Notify RN    Comment 2 Document in Chart   I-Stat CG4 Lactic Acid, ED  (not at  [facility])     Status: Abnormal   Collection Time: 03/04/18  9:23 AM  Result Value Ref Range   Lactic Acid, Venous 3.08 (HH) 0.5 - 1.9 mmol/L   Comment NOTIFIED PHYSICIAN   I-Stat CG4 Lactic Acid, ED  (not at  [facility])     Status: Abnormal   Collection Time: 03/04/18 12:38 PM  Result Value Ref Range   Lactic Acid, Venous 3.03 (HH) 0.5 - 1.9 mmol/L   Comment NOTIFIED PHYSICIAN     Ct Head Wo Contrast  Result Date: 03/04/2018 CLINICAL DATA:  Altered level of consciousness EXAM: CT HEAD WITHOUT CONTRAST TECHNIQUE: Contiguous axial images were obtained from the base of the skull through the vertex without intravenous contrast. COMPARISON:  02/16/2018 FINDINGS: Brain: No evidence of acute infarction, hemorrhage, hydrocephalus, extra-axial  collection or mass lesion/mass effect. Vascular: No hyperdense vessel or unexpected calcification. Skull: Normal. Negative for fracture or focal lesion. Sinuses/Orbits: No acute finding. Other: None. IMPRESSION: No acute abnormality noted.  No change from the prior exam. Electronically Signed   By: Inez Catalina M.D.   On: 03/04/2018 09:33  Ct Chest Wo Contrast  Result Date: 03/04/2018 CLINICAL DATA:  Shortness of breath, pneumonia EXAM: CT CHEST WITHOUT CONTRAST TECHNIQUE: Multidetector CT imaging of the chest was performed following the standard protocol without IV contrast. COMPARISON:  Chest x-ray 03/04/2018 FINDINGS: Cardiovascular: Heart is normal size. Aorta is normal caliber with scattered aortic calcifications. Moderate coronary artery calcifications in the left anterior descending coronary artery. Mediastinum/Nodes: Reactive borderline sized mediastinal lymph nodes. Lungs/Pleura: Complex large left pleural effusions with loculation and pleural gas. Cannot exclude bronchopleural fistula or empyema. Collapse of much of the left lung with only a small amount of aerated left upper lobe. Nodular airspace opacities in the right upper lobe and superior segment of the right lower lobe with trace right pleural effusion. Upper Abdomen: Imaging into the upper abdomen shows no acute findings. Musculoskeletal: Chest wall soft tissues are unremarkable. Degenerative changes in the thoracic spine. No acute bony abnormality. IMPRESSION: Large complex left pleural effusion with pleural gas and extensive loculations. This could reflect bronchopleural fistula or empyema. Collapse of much of the left lung with only a small amount of aerated left upper lobe. Nodular ground-glass airspace opacities centrally in the right upper lobe and in the superior segment of the right lower lobe, likely infectious/inflammatory. Coronary artery disease, aortic atherosclerosis. Electronically Signed   By: Rolm Baptise M.D.   On:  03/04/2018 11:39   Dg Chest Port 1 View  Result Date: 03/04/2018 CLINICAL DATA:  Endotracheal tube, nasogastric tube and chest tube placement. EXAM: PORTABLE CHEST 1 VIEW COMPARISON:  Chest x-ray from earlier same day. FINDINGS: LEFT-sided chest tube in place. Improved aeration of the LEFT hemithorax. Persistent pneumothorax at the LEFT lung base, moderate in size, with associated overlying airspace collapse. Small component of the pneumothorax seen at the LEFT lung apex. Dense opacity overlying the LEFT heart border is also compatible with atelectasis. RIGHT lung is clear. Heart size and mediastinal contours are within normal limits. Endotracheal tube appears well positioned with tip approximately 3 cm above the carina. Enteric tube passes below the diaphragm. LEFT subclavian central line appears well positioned with tip at the level of the lower SVC/cavoatrial junction. IMPRESSION: 1. Support apparatus appears appropriately positioned, as detailed above. 2. Significantly improved aeration of the LEFT hemithorax status post LEFT-sided chest tube placement. Persistent pneumothorax on the LEFT, moderate in size, main component at the LEFT lung base with associated perihilar airspace collapse. Electronically Signed   By: Franki Cabot M.D.   On: 03/04/2018 17:26   Dg Chest Port 1 View  Result Date: 03/04/2018 CLINICAL DATA:  67 year old female with altered mental status, not responding. EXAM: PORTABLE CHEST 1 VIEW COMPARISON:  Portable chest 02/22/2018. FINDINGS: Portable AP upright view at 0854 hours. Near complete opacification of the left hemithorax where as only mild left lung base opacity was present 10 days ago. No shift of the mediastinum. The right lung remains clear allowing for portable technique. Visualized tracheal air column is within normal limits. No pneumothorax. Paucity bowel gas in the upper abdomen. No acute osseous abnormality identified. IMPRESSION: New subtotal opacification of the left  hemithorax is nonspecific but suspicious for combination of left lung consolidation and pleural effusion. The right lung remains clear. Electronically Signed   By: Genevie Ann M.D.   On: 03/04/2018 09:41    Review of Systems  Unable to perform ROS: Mental status change   Blood pressure (!) 78/55, pulse (!) 137, temperature (!) 95.4 F (35.2 C), temperature source Rectal, resp. rate 18, height 5'  4" (1.626 m), weight 54.7 kg, SpO2 97 %. Physical Exam  Constitutional: No distress (distressed earlier, now sedated).  HENT:  Head: Normocephalic and atraumatic.  Neck: No tracheal deviation present.  Cardiovascular:  Tachycardic, irregular  Respiratory: She has no wheezes. She has no rales.  Intubated, decreased BS left base. CT in place, murky purulent fluid in tube  GI: She exhibits no distension.  Neurological:  sedated    Assessment/Plan: 67 yo woman recently discharged after being evaluated for failure to thrive and poor PO intake. Presented with altered mental status. CXR showed opacification of left chest. Intubated for acute respiratory failure. A chest drained about a liter of purulent fluid. CXR shows marked improvement but there is incomplete reexpansion of left lung.  Clinical picture suspicious for lung abscess with rupture into pleural space.  Of note she did have upper endoscopy during her recent admission. It was uncomplicated and no intervention was done, but does warrant som consideration for a possible esophageal injury. I think it is unlikely given the time course- she is now 6 days out from the procedure. Also there is no mediastinal air around the esophagus as you would expect.  Regardless of underlying cause, she is being treated appropriately with chest tube drainage, mechanical ventilation, broad spectrum antibiotics and hemodynamic support.  Will be available to assist if needed  Melrose Nakayama 03/04/2018, 5:43 PM

## 2018-03-04 NOTE — Progress Notes (Signed)
[facility] Progress Note Patient Name: Maria Avila DOB: 25-Aug-1950 MRN: [id number]   Date of Service  03/04/2018  HPI/Events of Note  Left sided empyema s/p CT insertion. Metabolic acidosis on ABG secondary to lactic acidosis trending up. On norepinephrine and vasopressin, HR 130s. Oliguric.  eICU Interventions  Bolus LR 1 liter. Titrate pressors to MAP of 65. Repeat lactate in 3 hours.     Intervention Category Major Interventions: Acid-Base disturbance - evaluation and management;Hypotension - evaluation and management Intermediate Interventions: Oliguria - evaluation and management  Judd Lien 03/04/2018, 7:55 PM

## 2018-03-04 NOTE — Progress Notes (Signed)
CVP 3

## 2018-03-04 NOTE — ED Triage Notes (Signed)
EMS reports from [facility], called for AMS not responding per baseline x several days.   BP 129/106 HR 130 RR 24 Sp02 96 non rebreather at 15lts Sp02 85 RA  CBG 62 on scene given 12.5 of D10 enroute CBG post 202  20 R forearm

## 2018-03-04 NOTE — Progress Notes (Signed)
CRITICAL VALUE ALERT  Critical Value:  ABG: pH-7.193; CO2-39.1; HCO3-15   Lactic Acid 4.8  Date & Time Notified:  03/04/2018 @ 1925  Provider Notified: Warren Lacy provider, [place], RN  Orders Received/Actions taken: Orders for 1L LR bolus. Repeat lactic in 3 hours. Will continue to monitor closely.  Clint Bolder, RN 03/04/18 7:25 PM

## 2018-03-04 NOTE — Progress Notes (Deleted)
A consult was received from an ED physician for vancomycin and cefepime per pharmacy dosing.  The patient's profile has been reviewed for ht/wt/allergies/indication/available labs.   A one time order has been placed for vancomycin 1 mg and cefepime 2 gm.    Further antibiotics/pharmacy consults should be ordered by admitting physician if indicated.                       Thank you, Eudelia Bunch, Pharm.D [phone] 03/04/2018 9:26 AM

## 2018-03-04 NOTE — Procedures (Signed)
Arterial Catheter Insertion Procedure Note Bambie E [place] [id number] [birth year]  Procedure: Insertion of Arterial Catheter  Indications: Blood pressure monitoring and Frequent blood sampling  Procedure Details Consent: Risks of procedure as well as the alternatives and risks of each were explained to the (patient/caregiver).  Consent for procedure obtained. Time Out: Verified patient identification, verified procedure, site/side was marked, verified correct patient position, special equipment/implants available, medications/allergies/relevent history reviewed, required imaging and test results available.  Performed  Maximum sterile technique was used including antiseptics, cap, gloves, gown, hand hygiene, mask and sheet. Skin prep: Chlorhexidine; local anesthetic administered 46ml 20 gauge catheter was inserted into right femoral artery using the Seldinger technique.  Line sutured.  Biopatch placed and sterile dressing applied. ULTRASOUND GUIDANCE USED: YES Evaluation Blood flow good; BP tracing good. Complications: No apparent complications.   Kennieth Rad, AGACNP-BC  Pulmonary & Critical Care Pgr: [phone] or if no answer [phone] 03/04/2018, 5:16 PM

## 2018-03-04 NOTE — Progress Notes (Signed)
A consult was received from an ED physician for vancomycin and cefepime per pharmacy dosing.  The patient's profile has been reviewed for ht/wt/allergies/indication/available labs.   A one time order has been placed for vancomycin 1 gm and cefepime 2 gm as well as Flagyl 500 mg IV q8h ordered by MD    Further antibiotics/pharmacy consults should be ordered by admitting physician if indicated.                       Thank you, Eudelia Bunch, Pharm.D [phone] 03/04/2018 9:26 AM

## 2018-03-04 NOTE — ED Notes (Signed)
Ed provider Vanita Panda notified patient has a critical lactic acid value of 3.08

## 2018-03-05 ENCOUNTER — Inpatient Hospital Stay (HOSPITAL_COMMUNITY): Payer: Medicare Other

## 2018-03-05 DIAGNOSIS — J869 Pyothorax without fistula: Secondary | ICD-10-CM

## 2018-03-05 LAB — PROTIME-INR
INR: 2.08
Prothrombin Time: 23.1 seconds — ABNORMAL HIGH (ref 11.4–15.2)

## 2018-03-05 LAB — POCT I-STAT 3, ART BLOOD GAS (G3+)
Acid-base deficit: 12 mmol/L — ABNORMAL HIGH (ref 0.0–2.0)
Acid-base deficit: 9 mmol/L — ABNORMAL HIGH (ref 0.0–2.0)
BICARBONATE: 13.7 mmol/L — AB (ref 20.0–28.0)
BICARBONATE: 15.1 mmol/L — AB (ref 20.0–28.0)
O2 Saturation: 98 %
O2 Saturation: 99 %
Patient temperature: 98.6
TCO2: 15 mmol/L — ABNORMAL LOW (ref 22–32)
TCO2: 16 mmol/L — ABNORMAL LOW (ref 22–32)
pCO2 arterial: 26.1 mmHg — ABNORMAL LOW (ref 32.0–48.0)
pCO2 arterial: 30 mmHg — ABNORMAL LOW (ref 32.0–48.0)
pH, Arterial: 7.265 — ABNORMAL LOW (ref 7.350–7.450)
pH, Arterial: 7.37 (ref 7.350–7.450)
pO2, Arterial: 116 mmHg — ABNORMAL HIGH (ref 83.0–108.0)
pO2, Arterial: 119 mmHg — ABNORMAL HIGH (ref 83.0–108.0)

## 2018-03-05 LAB — CBC
HCT: 29 % — ABNORMAL LOW (ref 36.0–46.0)
HEMOGLOBIN: 9.6 g/dL — AB (ref 12.0–15.0)
MCH: 30.5 pg (ref 26.0–34.0)
MCHC: 33.1 g/dL (ref 30.0–36.0)
MCV: 92.1 fL (ref 80.0–100.0)
NRBC: 0.1 % (ref 0.0–0.2)
Platelets: 220 10*3/uL (ref 150–400)
RBC: 3.15 MIL/uL — AB (ref 3.87–5.11)
RDW: 15.4 % (ref 11.5–15.5)
WBC: 24.6 10*3/uL — ABNORMAL HIGH (ref 4.0–10.5)

## 2018-03-05 LAB — BASIC METABOLIC PANEL
ANION GAP: 10 (ref 5–15)
ANION GAP: 11 (ref 5–15)
BUN: 30 mg/dL — ABNORMAL HIGH (ref 8–23)
BUN: 31 mg/dL — AB (ref 8–23)
CALCIUM: 8.1 mg/dL — AB (ref 8.9–10.3)
CHLORIDE: 111 mmol/L (ref 98–111)
CO2: 14 mmol/L — ABNORMAL LOW (ref 22–32)
CO2: 15 mmol/L — ABNORMAL LOW (ref 22–32)
Calcium: 8.1 mg/dL — ABNORMAL LOW (ref 8.9–10.3)
Chloride: 111 mmol/L (ref 98–111)
Creatinine, Ser: 1.95 mg/dL — ABNORMAL HIGH (ref 0.44–1.00)
Creatinine, Ser: 1.94 mg/dL — ABNORMAL HIGH (ref 0.44–1.00)
GFR calc Af Amer: 29 mL/min — ABNORMAL LOW (ref >60)
GFR calc non Af Amer: 25 mL/min — ABNORMAL LOW (ref >60)
GFR calc non Af Amer: 25 mL/min — ABNORMAL LOW (ref >60)
GFR, EST AFRICAN AMERICAN: 29 mL/min — AB (ref >60)
GLUCOSE: 139 mg/dL — AB (ref 70–99)
Glucose, Bld: 137 mg/dL — ABNORMAL HIGH (ref 70–99)
POTASSIUM: 3.7 mmol/L (ref 3.5–5.1)
POTASSIUM: 3.9 mmol/L (ref 3.5–5.1)
Sodium: 136 mmol/L (ref 135–145)
Sodium: 136 mmol/L (ref 135–145)

## 2018-03-05 LAB — PHOSPHORUS: Phosphorus: 4.9 mg/dL — ABNORMAL HIGH (ref 2.5–4.6)

## 2018-03-05 LAB — MAGNESIUM: MAGNESIUM: 1.5 mg/dL — AB (ref 1.7–2.4)

## 2018-03-05 LAB — GLUCOSE, CAPILLARY
GLUCOSE-CAPILLARY: 138 mg/dL — AB (ref 70–99)
Glucose-Capillary: 122 mg/dL — ABNORMAL HIGH (ref 70–99)

## 2018-03-05 LAB — URINE CULTURE: SPECIAL REQUESTS: NORMAL

## 2018-03-05 LAB — PH, BODY FLUID: pH, Body Fluid: 7.2

## 2018-03-05 LAB — LACTIC ACID, PLASMA: Lactic Acid, Venous: 4.4 mmol/L (ref 0.5–1.9)

## 2018-03-05 MED ORDER — ALBUMIN HUMAN 25 % IV SOLN
25.0000 g | Freq: Once | INTRAVENOUS | Status: AC
  Administered 2018-03-05: 25 g via INTRAVENOUS
  Filled 2018-03-05: qty 50

## 2018-03-05 MED ORDER — MAGNESIUM SULFATE 2 GM/50ML IV SOLN
2.0000 g | Freq: Once | INTRAVENOUS | Status: AC
  Administered 2018-03-05: 2 g via INTRAVENOUS
  Filled 2018-03-05: qty 50

## 2018-03-05 MED ORDER — STERILE WATER FOR INJECTION IJ SOLN
5.0000 mg | Freq: Once | RESPIRATORY_TRACT | Status: AC
  Administered 2018-03-05: 5 mg via INTRAPLEURAL
  Filled 2018-03-05: qty 5

## 2018-03-05 MED ORDER — SODIUM CHLORIDE 0.9 % IV BOLUS
500.0000 mL | Freq: Once | INTRAVENOUS | Status: AC
  Administered 2018-03-05: 500 mL via INTRAVENOUS

## 2018-03-05 MED ORDER — FAMOTIDINE IN NACL 20-0.9 MG/50ML-% IV SOLN
20.0000 mg | INTRAVENOUS | Status: DC
  Administered 2018-03-05: 20 mg via INTRAVENOUS
  Filled 2018-03-05: qty 50

## 2018-03-05 MED ORDER — SODIUM CHLORIDE 0.9 % IJ SOLN
10.0000 mg | Freq: Once | INTRAMUSCULAR | Status: AC
  Administered 2018-03-05: 10 mg via INTRAPLEURAL
  Filled 2018-03-05: qty 10

## 2018-03-05 NOTE — Progress Notes (Addendum)
NAME:  Maria Avila, MRN:  [id number], DOB:  [birth year], LOS: 1 ADMISSION DATE:  03/04/2018, CONSULTATION DATE:  03/04/2018 REFERRING MD:  Dr. Vanita Panda, CHIEF COMPLAINT:  AMS  Brief History   67 year old female presenting from [facility] for 2 day h/o of less responsive than baseline. Large loculated L pleural effusion, s/p CT on 10/31.  Past Medical History  DMT2, HTN, FTT Significant Hospital Events   10/31 admitted, CT placed, CVTS consulted  Consults: date of consult/date signed off & final recs:  CVTS: 10/31  Procedures (surgical and bedside):  10/31 ETT placed Chest tube placed 10/31  Significant Diagnostic Tests:  Chest CT 10/31 with effusion and air in pleural space  Micro Data:  Blood 10/31: No results Tracheal aspirate: many GPC, few GPR, few GNR. Culture pending Pleural fluid: many GPC, few GPR, few GNR. Culture pending MRSA PCR: neg  Antimicrobials:  Flagyl 10/31-10/31 (1 dose) Vancomycin 10/31>>> Zosyn 10/31>>>   Subjective:  Intubated.  Objective   Blood pressure 91/64, pulse (!) 104, temperature 98.4 F (36.9 C), resp. rate 18, height 5\' 4"  (1.626 m), weight 58.2 kg, SpO2 100 %. CVP:  [3 mmHg-4 mmHg] 3 mmHg  Vent Mode: PRVC FiO2 (%):  [40 %-50 %] 40 % Set Rate:  [18 bmp-24 bmp] 24 bmp Vt Set:  [320 mL-430 mL] 320 mL PEEP:  [5 cmH20] 5 cmH20 Plateau Pressure:  [9 cmH20-19 cmH20] 9 cmH20   Intake/Output Summary (Last 24 hours) at 03/05/2018 1005 Last data filed at 03/05/2018 0800 Gross per 24 hour  Intake 6267.02 ml  Output 1255 ml  Net 5012.02 ml   Filed Weights   03/04/18 1448 03/05/18 0500  Weight: 54.7 kg 58.2 kg    Examination: General: 67 year old AA female. Intubated, GCS 3T HENT: NCAT, MMM, intubated. Perrl Lungs: Minimal rhonchorous breath sounds on left, CTA on right Cardiovascular: irregularly tachy. Palpable peripheral pulses. R radial arterial line. 1+ pitting edema BLE. Abdomen: soft, non-tender,  non-distended Extremities: sedated, unable to move extremities. Does not react to painful stim. Neuro: GCS 3T 2/2 intubation. Does not react to painful stimuli. PERRL GU: foley catheter in place. No s/s of skin breakdown  Resolved Hospital Problem list   none  Assessment & Plan:  67 year old female who presents 2/2 empyema. Concern for esophageal injury 2/2 recent EGD but felt to be unlikely per CVTS. VSS after CT, ETT placement, and pressors.  Ventilator dependent respiratory failure ABG showing 7.37/26/120/16. FIo2 40, PEEP 5, RR 24, volume 320. Repeat ABG in am. - continue intubation - wean off vent as toelrated - repeat abg in am - fentanyl 16mcs per hour, wean as tolerated  Septic shock Improving, but still on multiple pressors. Fever curve improving, WBC 30.2->24.6, LA 6.7->4.4. MiVF NS, s/p 1L LR bolus o/n. UOP very minimal. - continue solucortef 50mL q 6 hours - continue levophed; currently 73mcg/hr, titrate for MAP> 65; wean as able - continue vasopressin, currently 0.03 U/min; titrate for MAP>65; wean as able - continue vanc (10/31-) per pharmacy - continue zosyn (10/31-) per pharmacy  Empyema S/P CT placement 10/31. ~1L output since placement. CVTS following. Will place tpa and pulmozyme per their recs. - continue chest tube - continue vanc (10/31-) per pharmacy consult - continue zosyn (10/31-) per pharmacy consult - TPA and then pulmozyme - CVTS following; no indication for sx at this time  HCAP - continue vanc (10/31-) per pharmacy consult - continue zosyn (10/31-) per pharmacy consult -  follow sputum cx, blood cx, pleural fluid cx, urine cx  Sedation  - Fentanyl drip as above - precedex if needed  FEN/GI - NPO  - pepcid for GI ppx - SCDs for dvt ppx  Disposition / Summary of Today's Plan 03/05/18   Continue ETT Titrate pressors with Map goal > 65 TAP and pulmozyme per CCM recs Monitor cultures Continue broad-spectrum abx    Diet:  NPO Pain/Anxiety/Delirium protocol (if indicated): fentanyl gtt VAP protocol (if indicated): n/a DVT prophylaxis: scd GI prophylaxis: pepcid Hyperglycemia protocol: n/a Mobility: n/a Code Status: partial, ok for intubation. No cpr Family Communication: at bedside  Labs   CBC: Recent Labs  Lab 02/27/18 0423 03/04/18 0851 03/04/18 1742 03/05/18 0402  WBC 9.8 24.2* 30.2* 24.6*  NEUTROABS  --  18.2*  --   --   HGB 12.0 10.4* 12.3 9.6*  HCT 34.8* 31.5* 35.8* 29.0*  MCV 89.2 95.2 90.2 92.1  PLT 324 260 304 016    Basic Metabolic Panel: Recent Labs  Lab 02/27/18 0423 03/04/18 0851 03/04/18 1742 03/05/18 0030 03/05/18 0402  NA  --  139 136 136 136  K  --  3.8 3.5 3.9 3.7  CL  --  111 108 111 111  CO2  --  18* 16* 14* 15*  GLUCOSE  --  111* 156* 137* 139*  BUN  --  29* 30* 30* 31*  CREATININE  --  1.96* 1.97* 1.95* 1.94*  CALCIUM  --  7.9* 8.4* 8.1* 8.1*  MG 1.4*  --  1.5*  --  1.5*  PHOS  --   --  4.5  --  4.9*   GFR: Estimated Creatinine Clearance: 24 mL/min (A) (by C-G formula based on SCr of 1.94 mg/dL (H)). Recent Labs  Lab 02/27/18 0423 03/04/18 0851  03/04/18 1238 03/04/18 1742 03/04/18 1746 03/04/18 2251 03/05/18 0402 03/05/18 0523  PROCALCITON  --   --   --   --  24.42  --   --   --   --   WBC 9.8 24.2*  --   --  30.2*  --   --  24.6*  --   LATICACIDVEN  --   --    < > 3.03*  --  4.8* 6.7*  --  4.4*   < > = values in this interval not displayed.    Liver Function Tests: Recent Labs  Lab 03/04/18 0851 03/04/18 1742  AST 31 34  ALT 24 28  ALKPHOS 110 153*  BILITOT 1.5* 2.1*  PROT 4.1* 4.7*  ALBUMIN 1.3* 1.3*   Recent Labs  Lab 03/04/18 1742  LIPASE 20  AMYLASE 59   No results for input(s): AMMONIA in the last 168 hours.  ABG    Component Value Date/Time   PHART 7.370 03/05/2018 0523   PCO2ART 26.1 (L) 03/05/2018 0523   PO2ART 119.0 (H) 03/05/2018 0523   HCO3 15.1 (L) 03/05/2018 0523   TCO2 16 (L) 03/05/2018 0523   ACIDBASEDEF  9.0 (H) 03/05/2018 0523   O2SAT 99.0 03/05/2018 0523     Coagulation Profile: Recent Labs  Lab 03/04/18 1742  INR 1.95    Cardiac Enzymes: No results for input(s): CKTOTAL, CKMB, CKMBINDEX, TROPONINI in the last 168 hours.  HbA1C: No results found for: HGBA1C  CBG: Recent Labs  Lab 03/04/18 0907 03/04/18 1736 03/04/18 2041 03/04/18 2346 03/05/18 0322  GLUCAP 107* 138* 126* 122* 138*    Admitting History of Present Illness.   67 year old female  presenting from [facility] for 2 days history of being less responsive than her baseline.       Recent hospitalization 10/15 - 10/26 for failure to thrive, pancytopenia, abdominal pain, and inability to tolerate oral diet.  Underwent EGD 10/25 with findings of normal esophagus, acute gastritis, and colonoscopy 10/25 which diverticulosis throughout.   Noted to be hypoglycemic, hypoxic  Review of Systems:   Unable to perform 11/1, 2/2 intubation  Past Medical History  She,  has a past medical history of Colitis, Diabetes mellitus without complication ([place]), Hypertension, and Malnutrition ([place]).   Surgical History    Past Surgical History:  Procedure Laterality Date  . BIOPSY  02/26/2018   Procedure: BIOPSY;  Surgeon: Wilford Corner, MD;  Location: [facility];  Service: Endoscopy;;  . COLONOSCOPY WITH PROPOFOL N/A 02/26/2018   Procedure: COLONOSCOPY WITH PROPOFOL;  Surgeon: Wilford Corner, MD;  Location: [facility];  Service: Endoscopy;  Laterality: N/A;  . ESOPHAGOGASTRODUODENOSCOPY (EGD) WITH PROPOFOL N/A 02/26/2018   Procedure: ESOPHAGOGASTRODUODENOSCOPY (EGD) WITH PROPOFOL;  Surgeon: Wilford Corner, MD;  Location: [place];  Service: Endoscopy;  Laterality: N/A;  . NO PAST SURGERIES     UNCERTAIN OF NAME OF SURGERY     Social History   Social History   Socioeconomic History  . Marital status: Married    Spouse name: Not on file  . Number of children: Not on file  . Years of  education: Not on file  . Highest education level: Not on file  Occupational History  . Not on file  Social Needs  . Financial resource strain: Not on file  . Food insecurity:    Worry: Not on file    Inability: Not on file  . Transportation needs:    Medical: Not on file    Non-medical: Not on file  Tobacco Use  . Smoking status: Former Research scientist (life sciences)  . Smokeless tobacco: Never Used  Substance and Sexual Activity  . Alcohol use: Not Currently  . Drug use: Not Currently  . Sexual activity: Not on file  Lifestyle  . Physical activity:    Days per week: Not on file    Minutes per session: Not on file  . Stress: Not on file  Relationships  . Social connections:    Talks on phone: Not on file    Gets together: Not on file    Attends religious service: Not on file    Active member of club or organization: Not on file    Attends meetings of clubs or organizations: Not on file    Relationship status: Not on file  . Intimate partner violence:    Fear of current or ex partner: Not on file    Emotionally abused: Not on file    Physically abused: Not on file    Forced sexual activity: Not on file  Other Topics Concern  . Not on file  Social History Narrative  . Not on file  ,  reports that she has quit smoking. She has never used smokeless tobacco. She reports that she drank alcohol. She reports that she has current or past drug history.   Family History   Her family history includes Alzheimer's disease in her mother; Colon cancer in her father.   Allergies Allergies  Allergen Reactions  . Crestor [Rosuvastatin Calcium] Swelling and Other (See Comments)    Swelling of legs and muscle weakness  . Zocor [Simvastatin] Swelling and Other (See Comments)    Swelling of legs and muscle  weakness     Home Medications  Prior to Admission medications   Medication Sig Start Date End Date Taking? Authorizing Provider  aspirin EC 81 MG tablet Take 81 mg by mouth daily. 07/21/17  Yes  [provider]  Cholecalciferol (VITAMIN D) 2000 units CAPS Take 2,000 Units by mouth daily.  07/21/17  Yes [provider]  feeding supplement, ENSURE ENLIVE, (ENSURE ENLIVE) LIQD Take 237 mLs by mouth 3 (three) times daily between meals. 02/27/18  Yes Amin, Jeanella Flattery, MD  lipase/protease/amylase 24000-76000 units CPEP Take 1 capsule (24,000 Units total) by mouth 3 (three) times daily with meals. 02/18/18  Yes Black, Lezlie Octave, NP  lisinopril (PRINIVIL,ZESTRIL) 10 MG tablet Take 10 mg by mouth daily. 07/16/17  Yes [provider]  mirtazapine (REMERON) 7.5 MG tablet Take 1 tablet (7.5 mg total) by mouth at bedtime. 02/18/18  Yes Black, Lezlie Octave, NP  Multiple Vitamin (MULTIVITAMIN) capsule Take 1 capsule by mouth daily. 07/21/17  Yes [provider]  naproxen sodium (ALEVE) 220 MG tablet Take 220 mg by mouth 2 (two) times daily as needed. 07/21/17  Yes [provider]  Omega-3 Fatty Acids (FISH OIL) 1000 MG CAPS Take 1,000 mg by mouth daily.  07/21/17  Yes [provider]  ondansetron (ZOFRAN ODT) 4 MG disintegrating tablet Take 1 tablet (4 mg total) by mouth every 6 (six) hours as needed. Patient taking differently: Take 4 mg by mouth every 6 (six) hours as needed for nausea or vomiting.  02/01/18  Yes Ward, Cyril Mourning N, DO  pantoprazole (PROTONIX) 40 MG tablet Take 40 mg by mouth daily. 07/21/17  Yes [provider]  pravastatin (PRAVACHOL) 40 MG tablet Take 40 mg by mouth every evening. 12/20/17  Yes [provider]  promethazine (PHENERGAN) 12.5 MG tablet Take 12.5 mg by mouth every 6 (six) hours as needed for nausea. 02/10/18  Yes [provider]  thiamine 100 MG tablet Take 1 tablet (100 mg total) by mouth daily. Patient not taking: Reported on 03/04/2018 02/28/18 03/30/18  Damita Lack, MD     Critical care time:

## 2018-03-05 NOTE — Progress Notes (Signed)
  Subjective: Intubated, sedated  Objective: Vital signs in last 24 hours: Temp:  [95.4 F (35.2 C)-101.3 F (38.5 C)] 98.4 F (36.9 C) (11/01 0815) Pulse Rate:  [104-139] 104 (11/01 0815) Cardiac Rhythm: Sinus tachycardia (11/01 0800) Resp:  [8-38] 18 (11/01 0815) BP: (56-133)/(24-117) 91/64 (11/01 0800) SpO2:  [91 %-100 %] 100 % (11/01 0815) Arterial Line BP: (71-304)/(43-300) 101/57 (11/01 0815) FiO2 (%):  [40 %-50 %] 40 % (11/01 0800) Weight:  [54.7 kg-58.2 kg] 58.2 kg (11/01 0500)  Hemodynamic parameters for last 24 hours: CVP:  [3 mmHg-4 mmHg] 3 mmHg  Intake/Output from previous day: 10/31 0701 - 11/01 0700 In: 6123.8 [I.V.:2167.1; IV Piggyback:3956.7] Out: 1255 [Urine:150; Chest Tube:1105] Intake/Output this shift: Total I/O In: 143.2 [I.V.:130.7; IV Piggyback:12.5] Out: 0   General appearance: intubated, sedated Heart: tachy, regular Lungs: rhonchi bilaterally murky drainage from CT  Lab Results: Recent Labs    03/04/18 1742 03/05/18 0402  WBC 30.2* 24.6*  HGB 12.3 9.6*  HCT 35.8* 29.0*  PLT 304 220   BMET:  Recent Labs    03/05/18 0030 03/05/18 0402  NA 136 136  K 3.9 3.7  CL 111 111  CO2 14* 15*  GLUCOSE 137* 139*  BUN 30* 31*  CREATININE 1.95* 1.94*  CALCIUM 8.1* 8.1*    PT/INR:  Recent Labs    03/04/18 1742  LABPROT 22.0*  INR 1.95   ABG    Component Value Date/Time   PHART 7.370 03/05/2018 0523   HCO3 15.1 (L) 03/05/2018 0523   TCO2 16 (L) 03/05/2018 0523   ACIDBASEDEF 9.0 (H) 03/05/2018 0523   O2SAT 99.0 03/05/2018 0523   CBG (last 3)  Recent Labs    03/04/18 2041 03/04/18 2346 03/05/18 0322  GLUCAP 126* 122* 138*    Assessment/Plan: S/P  -Left empyema drained with CT by CCM yesterday. On CXR there is incomplete reexpansion of the lower lobe with a space. Might benefit from thrombolytics when safe from [place] on vasopressors with norepi and vasopressin No indication for surgery at this point and  not stable enough for it anyway   LOS: 1 day    Melrose Nakayama 03/05/2018

## 2018-03-05 NOTE — Progress Notes (Addendum)
Initial Nutrition Assessment  DOCUMENTATION CODES:   Non-severe (moderate) malnutrition in context of chronic illness  INTERVENTION:   If unable to extubate patient within the next 24 hours, recommend start TF via NGT:  Vital AF 1.2 at 20 ml/h, increase by 10 ml every 8 hours to goal rate of 50 ml/h (1200 ml per day)  Provides 1440 kcal, 90 gm protein, 973 ml free water daily  Will need to monitor for refeeding syndrome due to recent minimal intake, weight loss, and moderate malnutrition.   When feeding initiated, monitor magnesium, potassium, and phosphorus daily for at least 3 days, MD to replete as needed.  NUTRITION DIAGNOSIS:   Moderate Malnutrition related to chronic illness(chronic colitis) as evidenced by moderate muscle depletion, moderate fat depletion, mild fat depletion, percent weight loss(10% weight loss within 6 months).  GOAL:   Patient will meet greater than or equal to 90% of their needs  MONITOR:   Vent status, Skin, Labs, I & O's  REASON FOR ASSESSMENT:   Ventilator, Malnutrition Screening Tool    ASSESSMENT:   67 yo female with PMH of HTN, colitis, DM, and malnutrition who was admitted on 10/31 with AMS, left empyema. Intubated and chest tube placed on 10/31.   CXR showed incomplete re-expansion of lower lobe. NGT in place.  Sisters in patient's room report patient has been eating minimally for the past few months.  Patient is currently intubated on ventilator support MV: 6.4 L/min Temp (24hrs), Avg:99.1 F (37.3 C), Min:95.4 F (35.2 C), Max:101.3 F (38.5 C)   Labs reviewed. BUN 31 (H), creatinine 1.94 (H), phosphorus 4.9 (H), magnesium 1.5 (L) Medications reviewed and include Solu-cortef, Levophed, Vasopressin.   1105 ml output from chest tube yesterday; 160 ml output this morning.  Weight down 10% in the past 6 months, 64.9 kg down to 58.2 kg today. Current weight likely elevated with volume overload.   NUTRITION - FOCUSED PHYSICAL  EXAM:    Most Recent Value  Orbital Region  No depletion  Upper Arm Region  Moderate depletion  Thoracic and Lumbar Region  Unable to assess  Buccal Region  Unable to assess  Temple Region  Moderate depletion  Clavicle Bone Region  Moderate depletion  Clavicle and Acromion Bone Region  Mild depletion  Scapular Bone Region  Unable to assess  Dorsal Hand  Unable to assess  Patellar Region  Moderate depletion  Anterior Thigh Region  Moderate depletion  Posterior Calf Region  Moderate depletion  Edema (RD Assessment)  Mild  Hair  Reviewed  Eyes  Unable to assess  Mouth  Unable to assess  Skin  Reviewed  Nails  Reviewed       Diet Order:   Diet Order            Diet NPO time specified  Diet effective now              EDUCATION NEEDS:   No education needs have been identified at this time  Skin:  Skin Assessment: Skin Integrity Issues: Skin Integrity Issues:: Stage II, Other (Comment) Stage II: bilateral buttocks, ischium Other: wound to perineum  Last BM:  PTA  Height:   Ht Readings from Last 1 Encounters:  03/04/18 5\' 4"  (1.626 m)    Weight:   Wt Readings from Last 1 Encounters:  03/05/18 58.2 kg    Ideal Body Weight:  54.5 kg  BMI:  Body mass index is 22.02 kg/m.  Estimated Nutritional Needs:   Kcal:  9678  Protein:  90-100 gm  Fluid:  1.5 L    Molli Barrows, RD, LDN, [place] Pager [phone] After Hours Pager [phone]

## 2018-03-05 NOTE — Progress Notes (Signed)
Sisters at bedside.  Procedure explained to sisters, including small risk of bleeding.  They are in agreement to proceed.  tPA instilled into left chest tube per protocol.  Will follow up with pulmozyme.  Follow serial CXR's.   Noe Gens, NP-C [facility] Pgr: [phone] or if no answer [phone] 03/05/2018, 11:27 AM

## 2018-03-05 NOTE — Progress Notes (Signed)
CRITICAL VALUE ALERT  Critical Value:  ABG pH 7.26; CO2-30; HCO3-13.7  Date & Time Notified:  03/05/2018 @ 0047  Provider Notified: Warren Lacy Provider, [place], RN  Orders Received/Actions taken: Awaiting new orders. Will continue to monitor closely.  Clint Bolder, RN 03/05/18 1:09 AM

## 2018-03-06 ENCOUNTER — Inpatient Hospital Stay (HOSPITAL_COMMUNITY): Payer: Medicare Other

## 2018-03-06 LAB — CBC WITH DIFFERENTIAL/PLATELET
Abs Immature Granulocytes: 0.54 10*3/uL — ABNORMAL HIGH (ref 0.00–0.07)
BASOS PCT: 1 %
Basophils Absolute: 0.1 10*3/uL (ref 0.0–0.1)
EOS ABS: 0 10*3/uL (ref 0.0–0.5)
Eosinophils Relative: 0 %
HEMATOCRIT: 28.8 % — AB (ref 36.0–46.0)
Hemoglobin: 9.6 g/dL — ABNORMAL LOW (ref 12.0–15.0)
Immature Granulocytes: 3 %
Lymphocytes Relative: 12 %
Lymphs Abs: 2.4 10*3/uL (ref 0.7–4.0)
MCH: 30.1 pg (ref 26.0–34.0)
MCHC: 33.3 g/dL (ref 30.0–36.0)
MCV: 90.3 fL (ref 80.0–100.0)
MONO ABS: 0.8 10*3/uL (ref 0.1–1.0)
MONOS PCT: 4 %
NEUTROS ABS: 16.2 10*3/uL — AB (ref 1.7–7.7)
NEUTROS PCT: 80 %
Platelets: 183 10*3/uL (ref 150–400)
RBC: 3.19 MIL/uL — AB (ref 3.87–5.11)
RDW: 15.5 % (ref 11.5–15.5)
WBC: 20.1 10*3/uL — AB (ref 4.0–10.5)
nRBC: 0.1 % (ref 0.0–0.2)

## 2018-03-06 LAB — POCT I-STAT 3, ART BLOOD GAS (G3+)
Acid-base deficit: 8 mmol/L — ABNORMAL HIGH (ref 0.0–2.0)
Bicarbonate: 17.6 mmol/L — ABNORMAL LOW (ref 20.0–28.0)
O2 Saturation: 99 %
Patient temperature: 36.6
TCO2: 19 mmol/L — ABNORMAL LOW (ref 22–32)
pCO2 arterial: 32.8 mmHg (ref 32.0–48.0)
pH, Arterial: 7.334 — ABNORMAL LOW (ref 7.350–7.450)
pO2, Arterial: 157 mmHg — ABNORMAL HIGH (ref 83.0–108.0)

## 2018-03-06 LAB — COMPREHENSIVE METABOLIC PANEL WITH GFR
ALT: 25 U/L (ref 0–44)
AST: 29 U/L (ref 15–41)
Albumin: 1.6 g/dL — ABNORMAL LOW (ref 3.5–5.0)
Alkaline Phosphatase: 203 U/L — ABNORMAL HIGH (ref 38–126)
Anion gap: 5 (ref 5–15)
BUN: 33 mg/dL — ABNORMAL HIGH (ref 8–23)
CO2: 17 mmol/L — ABNORMAL LOW (ref 22–32)
Calcium: 7.9 mg/dL — ABNORMAL LOW (ref 8.9–10.3)
Chloride: 115 mmol/L — ABNORMAL HIGH (ref 98–111)
Creatinine, Ser: 1.49 mg/dL — ABNORMAL HIGH (ref 0.44–1.00)
GFR calc Af Amer: 40 mL/min — ABNORMAL LOW (ref >60)
GFR calc non Af Amer: 35 mL/min — ABNORMAL LOW (ref >60)
Glucose, Bld: 121 mg/dL — ABNORMAL HIGH (ref 70–99)
Potassium: 3.1 mmol/L — ABNORMAL LOW (ref 3.5–5.1)
Sodium: 137 mmol/L (ref 135–145)
Total Bilirubin: 1.7 mg/dL — ABNORMAL HIGH (ref 0.3–1.2)
Total Protein: 4.9 g/dL — ABNORMAL LOW (ref 6.5–8.1)

## 2018-03-06 LAB — PREALBUMIN: Prealbumin: <5 mg/dL — ABNORMAL LOW (ref 18–38)

## 2018-03-06 LAB — MAGNESIUM: Magnesium: 1.9 mg/dL (ref 1.7–2.4)

## 2018-03-06 LAB — GLUCOSE, CAPILLARY
GLUCOSE-CAPILLARY: 146 mg/dL — AB (ref 70–99)
Glucose-Capillary: 130 mg/dL — ABNORMAL HIGH (ref 70–99)

## 2018-03-06 LAB — TRIGLYCERIDES: Triglycerides: 67 mg/dL (ref <150)

## 2018-03-06 LAB — PHOSPHORUS: Phosphorus: 3.9 mg/dL (ref 2.5–4.6)

## 2018-03-06 LAB — RHEUMATOID FACTORS, FLUID: RHEUMATOID ARTHRITIS, QN/FLUID: NEGATIVE

## 2018-03-06 MED ORDER — PANTOPRAZOLE SODIUM 40 MG IV SOLR
40.0000 mg | INTRAVENOUS | Status: DC
  Administered 2018-03-06 – 2018-03-16 (×11): 40 mg via INTRAVENOUS
  Filled 2018-03-06 (×12): qty 40

## 2018-03-06 MED ORDER — TRAVASOL 10 % IV SOLN
INTRAVENOUS | Status: AC
  Administered 2018-03-06: 18:00:00 via INTRAVENOUS
  Filled 2018-03-06: qty 432

## 2018-03-06 MED ORDER — POTASSIUM CHLORIDE 10 MEQ/50ML IV SOLN
10.0000 meq | INTRAVENOUS | Status: AC
  Administered 2018-03-06 (×3): 10 meq via INTRAVENOUS
  Filled 2018-03-06 (×3): qty 50

## 2018-03-06 MED ORDER — INSULIN ASPART 100 UNIT/ML ~~LOC~~ SOLN
0.0000 [IU] | SUBCUTANEOUS | Status: DC
  Administered 2018-03-06 (×2): 1 [IU] via SUBCUTANEOUS
  Administered 2018-03-07: 2 [IU] via SUBCUTANEOUS

## 2018-03-06 MED ORDER — HEPARIN SODIUM (PORCINE) 5000 UNIT/ML IJ SOLN: 5000.0000 [IU] | Freq: Two times a day (BID) | INTRAMUSCULAR | Status: DC

## 2018-03-06 NOTE — Progress Notes (Addendum)
Nutrition Follow-up  DOCUMENTATION CODES:   Non-severe (moderate) malnutrition in context of chronic illness  INTERVENTION:  TPN per Pharmacy.   Monitor magnesium, potassium, and phosphorus daily for at least 3 days, MD to replete as needed, as pt is at risk for refeeding syndrome given malnutrition and poor po intake PTA.  RD to continue to monitor.   NUTRITION DIAGNOSIS:   Moderate Malnutrition related to chronic illness(chronic colitis) as evidenced by moderate muscle depletion, moderate fat depletion, mild fat depletion, percent weight loss(10% weight loss within 6 months); ongoing  GOAL:   Patient will meet greater than or equal to 90% of their needs; progressing  MONITOR:   Vent status, Skin, Labs, I & O's  REASON FOR ASSESSMENT:   Ventilator, Malnutrition Screening Tool    ASSESSMENT:   67 yo female with PMH of HTN, colitis, DM, and malnutrition who was admitted on 10/31 with AMS, left empyema. Intubated and chest tube placed on 10/31.  Chest tube output 345 ml.  Patient is currently intubated on ventilator support MV: 8.7 L/min Temp (24hrs), Avg:97.4 F (36.3 C), Min:96.3 F (35.7 C), Max:97.9 F (36.6 C)  Propofol: none  Per MD note, pt with suspected esophageal rupture secondary to EGD. MD reports pt with hx of inability to tolerate oral diet. Pt to maintain strict NPO with NGT to suction. Plans to initiate TPN. Per Pharmacy note, TPN at 30 ml/hr to provide 638 kcal (43% of kcal needs) and 43 grams of protein(48% of protein needs). TPN goal rate of 70 ml/hr.  ILE on hold for first 7 days per ASPEN guidelines for critically ill patients.   Monitor magnesium, potassium, and phosphorus daily for at least 3 days, MD to replete as needed, as pt is at risk for refeeding syndrome given malnutrition and minimal po intake PTA.  Labs and medications reviewed.   Diet Order:   Diet Order            Diet NPO time specified  Diet effective now               EDUCATION NEEDS:   No education needs have been identified at this time  Skin:  Skin Assessment: Skin Integrity Issues: Skin Integrity Issues:: Stage II Stage II: ischium Other: wound to perineum  Last BM:  PTA  Height:   Ht Readings from Last 1 Encounters:  03/04/18 5\' 4"  (1.626 m)    Weight:   Wt Readings from Last 1 Encounters:  03/06/18 60.8 kg  03/04/18  54.7 kg (Admission weight)  Ideal Body Weight:  54.5 kg  BMI:  Body mass index is 23.01 kg/m.  Estimated Nutritional Needs:   Kcal:  1475  Protein:  90-100 gm  Fluid:  1.5 L    Corrin Parker, MS, RD, LDN Pager # [phone] After hours/ weekend pager # [phone]

## 2018-03-06 NOTE — Progress Notes (Signed)
NAME:  Maria Avila, MRN:  [id number], DOB:  Mar 21, 1951, LOS: 2 ADMISSION DATE:  03/04/2018, CONSULTATION DATE:  03/04/2018 REFERRING MD:  Dr. Vanita Panda, CHIEF COMPLAINT:  AMS  Brief History   67 year old female presenting from [facility] for 2 day h/o of less responsive than baseline. Large loculated L pleural effusion, s/p CT on 10/31.  Past Medical History  DMT2, HTN, FTT Significant Hospital Events   10/31 admitted, CT placed, CVTS consulted  Consults: date of consult/date signed off & final recs:  CVTS: 10/31  Procedures (surgical and bedside):  10/31 ETT placed Chest tube placed 10/31  Significant Diagnostic Tests:  Chest CT 10/31 with effusion and air in pleural space CXR 11/2: near complete resolution of the left pleural effusion. Micro Data:  Blood 10/31: No results Tracheal aspirate: many GPC, few GPR, few GNR. Culture pending Pleural fluid: many GPC, few GPR, few GNR. Culture pending MRSA PCR: neg  Antimicrobials:  Flagyl 10/31-10/31 (1 dose) Vancomycin 10/31>>> Zosyn 10/31>>>   Subjective:  250ml out overnight, 55ml today.  Awake on fentanyl 50. FC for RN. Appears comfortable.  Objective   Blood pressure (!) 92/54, pulse 77, temperature 97.7 F (36.5 C), resp. rate 19, height 5\' 4"  (1.626 m), weight 60.8 kg, SpO2 100 %. CVP:  [1 mmHg-8 mmHg] 8 mmHg  Vent Mode: PRVC FiO2 (%):  [40 %] 40 % Set Rate:  [24 bmp] 24 bmp Vt Set:  [320 mL] 320 mL PEEP:  [5 cmH20] 5 cmH20 Plateau Pressure:  [13 cmH20] 13 cmH20   Intake/Output Summary (Last 24 hours) at 03/06/2018 1327 Last data filed at 03/06/2018 1300 Gross per 24 hour  Intake 3176.11 ml  Output 724 ml  Net 2452.11 ml   Filed Weights   03/04/18 1448 03/05/18 0500 03/06/18 0500  Weight: 54.7 kg 58.2 kg 60.8 kg    Examination: General: 67 year old AA female. Intubated, normotensive on 8 of NE.  HENT: dry mucous membranes, ETT in place and NGT in place without pressure ulceration. Minimal  drainage of gastric contents Lungs: chest clear with no ventilator asynchrony. L chest tube draining turbid yellow fluid. No air leak. Cardiovascular: irregular rhythm, S1S2 normal. Warm extremities. Palpable peripheral pulses. R radial arterial line.  Abdomen: soft, non-tender, non-distended Extremities: 1+ pitting edema BLE. Line sites intact. Neuro: Follows commands but somnolent, moves all limbs GU: foley catheter in place. No s/s of skin breakdown  Resolved Hospital Problem list   none  Assessment & Plan:   Remains critically ill due to septic shock requiring titration of norepinephrine to maintain MAP>65. Did not tolerate reduction today. Critically ill due to respiratory failure requiring mechanical ventilation. Left chest empyema from suspected esophageal perforation from EGD. Hypokalemia from NG drainage. Unexplained weight loss at home.  PLAN:  Continue to wean NE as tolerated, but suspect will not come off until off sedation +/- chest tube out. Begin weaning trials tomorrow. Discussed with CT surgery on call (Bartles) - no urgency to perform CT gastrografin. Plan to perform at 7 day mark Continue strict NPO and NGT drainage Replace potassium Initiate TPN to ensure adequate nutrition.   Disposition / Summary of Today's Plan 03/06/18   Start to work towards extubation. Chest tube to remain in place until gastrografin study.    Diet: NPO Pain/Anxiety/Delirium protocol (if indicated): fentanyl gtt VAP protocol (if indicated): in place DVT prophylaxis: heparin BID [place] GI prophylaxis: protonix iv Hyperglycemia protocol: [place] correction scale. Mobility: bedrest. Code Status: partial,  ok for intubation. No cpr Family Communication: at bedside  Labs (personally reviewed)  CBC: Recent Labs  Lab 03/04/18 0851 03/04/18 1742 03/05/18 0402 03/06/18 0408  WBC 24.2* 30.2* 24.6* 20.1*  NEUTROABS 18.2*  --   --  16.2*  HGB 10.4* 12.3 9.6* 9.6*  HCT 31.5* 35.8* 29.0* 28.8*    MCV 95.2 90.2 92.1 90.3  PLT 260 304 220 242    Basic Metabolic Panel: Recent Labs  Lab 03/04/18 0851 03/04/18 1742 03/05/18 0030 03/05/18 0402 03/06/18 0408  NA 139 136 136 136 137  K 3.8 3.5 3.9 3.7 3.1*  CL 111 108 111 111 115*  CO2 18* 16* 14* 15* 17*  GLUCOSE 111* 156* 137* 139* 121*  BUN 29* 30* 30* 31* 33*  CREATININE 1.96* 1.97* 1.95* 1.94* 1.49*  CALCIUM 7.9* 8.4* 8.1* 8.1* 7.9*  MG  --  1.5*  --  1.5* 1.9  PHOS  --  4.5  --  4.9* 3.9   GFR: Estimated Creatinine Clearance: 31.2 mL/min (A) (by C-G formula based on SCr of 1.49 mg/dL (H)). Recent Labs  Lab 03/04/18 0851  03/04/18 1238 03/04/18 1742 03/04/18 1746 03/04/18 2251 03/05/18 0402 03/05/18 0523 03/06/18 0408  PROCALCITON  --   --   --  24.42  --   --   --   --   --   WBC 24.2*  --   --  30.2*  --   --  24.6*  --  20.1*  LATICACIDVEN  --    < > 3.03*  --  4.8* 6.7*  --  4.4*  --    < > = values in this interval not displayed.    Liver Function Tests: Recent Labs  Lab 03/04/18 0851 03/04/18 1742 03/06/18 0408  AST 31 34 29  ALT 24 28 25   ALKPHOS 110 153* 203*  BILITOT 1.5* 2.1* 1.7*  PROT 4.1* 4.7* 4.9*  ALBUMIN 1.3* 1.3* 1.6*   Recent Labs  Lab 03/04/18 1742  LIPASE 20  AMYLASE 59   No results for input(s): AMMONIA in the last 168 hours.  ABG    Component Value Date/Time   PHART 7.334 (L) 03/06/2018 0406   PCO2ART 32.8 03/06/2018 0406   PO2ART 157.0 (H) 03/06/2018 0406   HCO3 17.6 (L) 03/06/2018 0406   TCO2 19 (L) 03/06/2018 0406   ACIDBASEDEF 8.0 (H) 03/06/2018 0406   O2SAT 99.0 03/06/2018 0406     Coagulation Profile: Recent Labs  Lab 03/04/18 1742 03/05/18 0944  INR 1.95 2.08    Cardiac Enzymes: No results for input(s): CKTOTAL, CKMB, CKMBINDEX, TROPONINI in the last 168 hours.  HbA1C: No results found for: HGBA1C  CBG: Recent Labs  Lab 03/04/18 0907 03/04/18 1736 03/04/18 2041 03/04/18 2346 03/05/18 0322  GLUCAP 107* 138* 126* 122* 138*      Critical care time:   Performed by: Kipp Brood   Total critical care time: 40 minutes  Critical care time was exclusive of separately billable procedures and treating other patients.  Critical care was necessary to treat or prevent imminent or life-threatening deterioration.  Critical care was time spent personally by me on the following activities: development of treatment plan with patient and/or surrogate as well as nursing, discussions with consultants, evaluation of patient's response to treatment, examination of patient, obtaining history from patient or surrogate, ordering and performing treatments and interventions, ordering and review of laboratory studies, ordering and review of radiographic studies, pulse oximetry, re-evaluation of patient's condition and participation in  multidisciplinary rounds.  Kipp Brood, MD [facility] ICU Physician [place]  Pager: [phone] Mobile: [phone] After hours: [phone].

## 2018-03-06 NOTE — Progress Notes (Addendum)
PHARMACY - ADULT TOTAL PARENTERAL NUTRITION CONSULT NOTE   Pharmacy Consult:  TPN Indication:  Possible esophageal perforation  Patient Measurements: Height: 5' 4" (162.6 cm) Weight: 134 lb 0.6 oz (60.8 kg) IBW/kg (Calculated) : 54.7 TPN AdjBW (KG): 54.7 Body mass index is 23.01 kg/m.  Assessment:  67 YOF with moderate malnutrition presented on 03/04/18 with AMS.  Patient was recently hospitalized for abdominal pain, failure to thrive and inability to tolerate an oral diet.  Now concerned with esophageal perforation, so Pharmacy consulted to initiate TPN.  Per documentation, patient has been eating minimally for the past several months and weight decreased from 64.9 to 58.2 kg, now up with fluid resuscitation.  GI: hx colitis/gastritis/FTT.  Baseline prealbumin low at < 5.  Pepcid to be added to TPN. Endo: DM - AM glucose WNL (stress steroid) Insulin requirements in the past 24 hours: N/A Lytes: K 3.1, high CL and low CO2, others WNL Renal: SCr down 1.49 (BL SCr 0.6), BUN 33 - UOP 0.2 ml/kg/hr, NS at 100 ml/hr, net +8.3L Pulm: FiO2 40%, CT O/P down to 345mL - HC Cards: HTN - MAP 70-80s, tachy improving, CVP 6-8 - Levophed at 10 (weaning) Hepatobil: AST/ALT WNL, alk phos 203, tbili 1.7, INR 2.08, TG WNL Neuro: Fentanyl gtt at 50 (weaning) - GCS 10, CPOT 0-1, RASS -2 ID: [email or web address]/Zosyn for left empyema, tPA/Pulmozyme - afebrile, WBC down 20.1, LA 4.4 TPN Access: CVC triple lumen placed 03/04/18 TPN start date: 03/06/18  Nutritional Goals (per RD rec on 03/05/18): 1475 kCal, 90-100gm protein, 1.5L fluid per day  Current Nutrition:  NPO   Plan:  Initiate TPN at 30 ml/hr (goal 70 ml/hr) Hold 20% ILE for first 7 days for ICU patients per ASPEN guidelines (start date 06/13/17) TPN will provide 43g AA and 137g CHO for a total of 638 kCal, meeting ~40% of patient's needs Electrolytes in TPN: standard, max acetate today Daily multivitamin, trace elements and Pepcid 20mg in TPN - watch  tbili Start sensitive SSI Q4H KCL x 3 runs NS at 100 ml/hr per MD.  F/U with reducing rate as TPN is advanced and volume is resuscitated. F/U AM labs, Gastrografin study    D. , PharmD, BCPS, BCCCP 03/06/2018, 7:57 AM    

## 2018-03-07 LAB — PHOSPHORUS: Phosphorus: 2.9 mg/dL (ref 2.5–4.6)

## 2018-03-07 LAB — CBC WITH DIFFERENTIAL/PLATELET
Basophils Absolute: 0 10*3/uL (ref 0.0–0.1)
Basophils Relative: 0 %
EOS ABS: 0 10*3/uL (ref 0.0–0.5)
EOS PCT: 0 %
HEMATOCRIT: 24.7 % — AB (ref 36.0–46.0)
HEMOGLOBIN: 8.6 g/dL — AB (ref 12.0–15.0)
LYMPHS PCT: 8 %
Lymphs Abs: 1.4 10*3/uL (ref 0.7–4.0)
MCH: 30.8 pg (ref 26.0–34.0)
MCHC: 34.8 g/dL (ref 30.0–36.0)
MCV: 88.5 fL (ref 80.0–100.0)
MONO ABS: 1 10*3/uL (ref 0.1–1.0)
MONOS PCT: 6 %
Neutro Abs: 14.5 10*3/uL — ABNORMAL HIGH (ref 1.7–7.7)
Neutrophils Relative %: 86 %
Platelets: 121 10*3/uL — ABNORMAL LOW (ref 150–400)
RBC: 2.79 MIL/uL — ABNORMAL LOW (ref 3.87–5.11)
RDW: 15.6 % — ABNORMAL HIGH (ref 11.5–15.5)
WBC: 16.9 10*3/uL — ABNORMAL HIGH (ref 4.0–10.5)
nRBC: 0.3 % — ABNORMAL HIGH (ref 0.0–0.2)
nRBC: 2 /100 WBC — ABNORMAL HIGH

## 2018-03-07 LAB — GLUCOSE, CAPILLARY
GLUCOSE-CAPILLARY: 142 mg/dL — AB (ref 70–99)
GLUCOSE-CAPILLARY: 130 mg/dL — AB (ref 70–99)
Glucose-Capillary: 140 mg/dL — ABNORMAL HIGH (ref 70–99)
Glucose-Capillary: 144 mg/dL — ABNORMAL HIGH (ref 70–99)
Glucose-Capillary: 170 mg/dL — ABNORMAL HIGH (ref 70–99)
Glucose-Capillary: 170 mg/dL — ABNORMAL HIGH (ref 70–99)

## 2018-03-07 LAB — COMPREHENSIVE METABOLIC PANEL
ALK PHOS: 341 U/L — AB (ref 38–126)
ALT: 37 U/L (ref 0–44)
AST: 51 U/L — AB (ref 15–41)
Albumin: 1.4 g/dL — ABNORMAL LOW (ref 3.5–5.0)
Anion gap: 3 — ABNORMAL LOW (ref 5–15)
BUN: 30 mg/dL — AB (ref 8–23)
CALCIUM: 8.1 mg/dL — AB (ref 8.9–10.3)
CHLORIDE: 117 mmol/L — AB (ref 98–111)
CO2: 17 mmol/L — AB (ref 22–32)
CREATININE: 1.25 mg/dL — AB (ref 0.44–1.00)
GFR calc Af Amer: 50 mL/min — ABNORMAL LOW (ref >60)
GFR calc non Af Amer: 43 mL/min — ABNORMAL LOW (ref >60)
Glucose, Bld: 180 mg/dL — ABNORMAL HIGH (ref 70–99)
Potassium: 3.5 mmol/L (ref 3.5–5.1)
SODIUM: 137 mmol/L (ref 135–145)
Total Bilirubin: 1.3 mg/dL — ABNORMAL HIGH (ref 0.3–1.2)
Total Protein: 4.5 g/dL — ABNORMAL LOW (ref 6.5–8.1)

## 2018-03-07 LAB — CULTURE, RESPIRATORY

## 2018-03-07 LAB — CULTURE, RESPIRATORY W GRAM STAIN: Special Requests: NORMAL

## 2018-03-07 LAB — MAGNESIUM: MAGNESIUM: 2 mg/dL (ref 1.7–2.4)

## 2018-03-07 MED ORDER — POTASSIUM PHOSPHATES 15 MMOLE/5ML IV SOLN
10.0000 mmol | Freq: Once | INTRAVENOUS | Status: AC
  Administered 2018-03-07: 10 mmol via INTRAVENOUS
  Filled 2018-03-07: qty 3.33

## 2018-03-07 MED ORDER — BISACODYL 10 MG RE SUPP
10.0000 mg | Freq: Every day | RECTAL | Status: DC | PRN
  Administered 2018-03-07: 10 mg via RECTAL
  Filled 2018-03-07: qty 1

## 2018-03-07 MED ORDER — FUROSEMIDE 10 MG/ML IJ SOLN
40.0000 mg | Freq: Once | INTRAMUSCULAR | Status: AC
  Administered 2018-03-07: 40 mg via INTRAVENOUS
  Filled 2018-03-07: qty 4

## 2018-03-07 MED ORDER — TRAVASOL 10 % IV SOLN
INTRAVENOUS | Status: AC
  Administered 2018-03-07: 17:00:00 via INTRAVENOUS
  Filled 2018-03-07: qty 720

## 2018-03-07 MED ORDER — SODIUM CHLORIDE 0.9 % IV SOLN
3.0000 g | Freq: Three times a day (TID) | INTRAVENOUS | Status: DC
  Administered 2018-03-07 – 2018-03-12 (×15): 3 g via INTRAVENOUS
  Filled 2018-03-07 (×17): qty 3

## 2018-03-07 MED ORDER — SODIUM CHLORIDE 0.9 % IV SOLN
INTRAVENOUS | Status: DC
  Administered 2018-03-08 – 2018-03-10 (×5): via INTRAVENOUS
  Administered 2018-03-11: 75 mL/h via INTRAVENOUS
  Administered 2018-03-12 – 2018-03-16 (×6): via INTRAVENOUS

## 2018-03-07 MED ORDER — INSULIN ASPART 100 UNIT/ML ~~LOC~~ SOLN
0.0000 [IU] | SUBCUTANEOUS | Status: DC
  Administered 2018-03-07 (×2): 2 [IU] via SUBCUTANEOUS
  Administered 2018-03-07: 3 [IU] via SUBCUTANEOUS
  Administered 2018-03-07 – 2018-03-09 (×7): 2 [IU] via SUBCUTANEOUS

## 2018-03-07 NOTE — Progress Notes (Addendum)
NAME:  Maria Avila, MRN:  [id number], DOB:  [birth year], LOS: 3 ADMISSION DATE:  03/04/2018, CONSULTATION DATE:  03/04/2018 REFERRING MD:  Dr. Vanita Panda, CHIEF COMPLAINT:  AMS  Brief History   67 year old female presenting from [facility] for 2 day h/o of less responsive than baseline. Large loculated L pleural effusion, s/p CT on 10/31.  Past Medical History  DMT2, HTN, unexplained FTT despite extensive work-up. Significant Hospital Events   10/31 admitted, CT placed, CVTS consulted  Consults: date of consult/date signed off & final recs:  CVTS: 10/31  Procedures (surgical and bedside):  10/31 ETT placed Chest tube placed 10/31  Significant Diagnostic Tests:  Chest CT 10/31 with effusion and air in pleural space CXR 11/2: near complete resolution of the left pleural effusion. Micro Data:  Blood 10/31: No results Tracheal aspirate: many GPC, few GPR, few GNR. Culture pending Pleural fluid: many GPC, few GPR, few GNR. Culture pending MRSA PCR: neg  Antimicrobials:  Flagyl 10/31-10/31 (1 dose) Vancomycin 10/31>>> 11/3 Zosyn 10/31>>> 11/3  Subjective:  244ml out overnight, 34ml today.  Awake on fentanyl 50. FC for RN. Appears comfortable.  Objective   Blood pressure 116/85, pulse (!) 111, temperature (!) 97.4 F (36.3 C), temperature source Axillary, resp. rate 18, height 5\' 4"  (1.626 m), weight 63.3 kg, SpO2 100 %. CVP:  [4 mmHg-8 mmHg] 4 mmHg  Vent Mode: PRVC FiO2 (%):  [30 %-60 %] 40 % Set Rate:  [24 bmp] 24 bmp Vt Set:  [320 mL] 320 mL PEEP:  [5 cmH20] 5 cmH20 Plateau Pressure:  [12 cmH20-14 cmH20] 12 cmH20   Intake/Output Summary (Last 24 hours) at 03/07/2018 0935 Last data filed at 03/07/2018 0800 Gross per 24 hour  Intake 3449.97 ml  Output 615 ml  Net 2834.97 ml   Filed Weights   03/05/18 0500 03/06/18 0500 03/07/18 0413  Weight: 58.2 kg 60.8 kg 63.3 kg    Examination: General: 67 year old AA female. Intubated, normotensive, now off of  NE HENT: dry mucous membranes, ETT in place and NGT in place without pressure ulceration. Minimal drainage of gastric contents (61ml/shift) Lungs: chest clear with no ventilator asynchrony, tolerating PS 5/5 with SBI 30. L chest tube draining turbid yellow fluid. No air leak. (60ml/shift) Cardiovascular: irregular rhythm, S1S2 normal. Warm extremities. Palpable peripheral pulses. R radial arterial line.  Abdomen: soft, non-tender, non-distended Extremities: 2+ pitting edema BLE. Line sites intact. Neuro: Follows commands more briskly today, moves all limbs GU: foley catheter in place. No s/s of skin breakdown  Resolved Hospital Problem list   none  Assessment & Plan:   Septic shock has now resolved. Remains critically ill due to respiratory failure requiring mechanical ventilation.  May be ready for extubation once mental status clears further. Left chest empyema from suspected esophageal perforation from EGD with adequate drainage. Positive fluid balance from prior resuscitation. Unexplained weight loss at home.   PLAN:  Stop stress steroids. Continue to monitor blood pressure SBT and complete sedation vacation today.  Possible extubation Diurese to mobilize fluid in anticipation of extubation. Discontinue arterial line if tolerates diuresis. Discussed with CT surgery on call (Bartles) - no urgency to perform CT gastrografin. Plan to perform at 7 day mark Continue strict NPO and NGT drainage Initiated TPN to ensure adequate nutrition.  Disposition / Summary of Today's Plan 03/07/18   Start to work towards extubation. Chest tube to remain in place until gastrografin study.    Diet: NPO strict! suppository for  constipation. Pain/Anxiety/Delirium protocol (if indicated): fentanyl gtt VAP protocol (if indicated): in place DVT prophylaxis: heparin BID [place] GI prophylaxis: protonix iv Hyperglycemia protocol: [place] correction scale. Mobility: bedrest. Antibiotic de-escalation:  Simplified to Unasyn. Code Status: partial, ok for intubation. No cpr Family Communication: at bedside  Labs (personally reviewed)  CBC: Recent Labs  Lab 03/04/18 0851 03/04/18 1742 03/05/18 0402 03/06/18 0408 03/07/18 0352  WBC 24.2* 30.2* 24.6* 20.1* 16.9*  NEUTROABS 18.2*  --   --  16.2* 14.5*  HGB 10.4* 12.3 9.6* 9.6* 8.6*  HCT 31.5* 35.8* 29.0* 28.8* 24.7*  MCV 95.2 90.2 92.1 90.3 88.5  PLT 260 304 220 183 121*    Basic Metabolic Panel: Recent Labs  Lab 03/04/18 1742 03/05/18 0030 03/05/18 0402 03/06/18 0408 03/07/18 0352  NA 136 136 136 137 137  K 3.5 3.9 3.7 3.1* 3.5  CL 108 111 111 115* 117*  CO2 16* 14* 15* 17* 17*  GLUCOSE 156* 137* 139* 121* 180*  BUN 30* 30* 31* 33* 30*  CREATININE 1.97* 1.95* 1.94* 1.49* 1.25*  CALCIUM 8.4* 8.1* 8.1* 7.9* 8.1*  MG 1.5*  --  1.5* 1.9 2.0  PHOS 4.5  --  4.9* 3.9 2.9   GFR: Estimated Creatinine Clearance: 37.2 mL/min (A) (by C-G formula based on SCr of 1.25 mg/dL (H)). Recent Labs  Lab 03/04/18 1238 03/04/18 1742 03/04/18 1746 03/04/18 2251 03/05/18 0402 03/05/18 0523 03/06/18 0408 03/07/18 0352  PROCALCITON  --  24.42  --   --   --   --   --   --   WBC  --  30.2*  --   --  24.6*  --  20.1* 16.9*  LATICACIDVEN 3.03*  --  4.8* 6.7*  --  4.4*  --   --     Liver Function Tests: Recent Labs  Lab 03/04/18 0851 03/04/18 1742 03/06/18 0408 03/07/18 0352  AST 31 34 29 51*  ALT 24 28 25  37  ALKPHOS 110 153* 203* 341*  BILITOT 1.5* 2.1* 1.7* 1.3*  PROT 4.1* 4.7* 4.9* 4.5*  ALBUMIN 1.3* 1.3* 1.6* 1.4*   Recent Labs  Lab 03/04/18 1742  LIPASE 20  AMYLASE 59   No results for input(s): AMMONIA in the last 168 hours.  ABG    Component Value Date/Time   PHART 7.334 (L) 03/06/2018 0406   PCO2ART 32.8 03/06/2018 0406   PO2ART 157.0 (H) 03/06/2018 0406   HCO3 17.6 (L) 03/06/2018 0406   TCO2 19 (L) 03/06/2018 0406   ACIDBASEDEF 8.0 (H) 03/06/2018 0406   O2SAT 99.0 03/06/2018 0406     Coagulation  Profile: Recent Labs  Lab 03/04/18 1742 03/05/18 0944  INR 1.95 2.08    Cardiac Enzymes: No results for input(s): CKTOTAL, CKMB, CKMBINDEX, TROPONINI in the last 168 hours.  HbA1C: No results found for: HGBA1C  CBG: Recent Labs  Lab 03/05/18 0322 03/06/18 1957 03/06/18 2344 03/07/18 0356 03/07/18 0659  GLUCAP 138* 130* 146* 170* 170*     Critical care time:   Performed by: Kipp Brood   Total critical care time: 35 minutes  Critical care time was exclusive of separately billable procedures and treating other patients.  Critical care was necessary to treat or prevent imminent or life-threatening deterioration.  Critical care was time spent personally by me on the following activities: development of treatment plan with patient and/or surrogate as well as nursing, discussions with consultants, evaluation of patient's response to treatment, examination of patient, obtaining history from patient or surrogate, ordering  and performing treatments and interventions, ordering and review of laboratory studies, ordering and review of radiographic studies, pulse oximetry, re-evaluation of patient's condition and participation in multidisciplinary rounds.  Kipp Brood, MD [facility] ICU Physician [place]  Pager: [phone] Mobile: [phone] After hours: [phone].

## 2018-03-07 NOTE — Progress Notes (Signed)
PHARMACY - ADULT TOTAL PARENTERAL NUTRITION CONSULT NOTE   Pharmacy Consult:  TPN Indication:  Possible esophageal perforation  Patient Measurements: Height: 5' 4"  (162.6 cm) Weight: 139 lb 8.8 oz (63.3 kg) IBW/kg (Calculated) : 54.7 TPN AdjBW (KG): 54.7 Body mass index is 23.95 kg/m.  Assessment:  54 YOF with moderate malnutrition presented on 03/04/18 with AMS.  Patient was recently hospitalized for abdominal pain, failure to thrive and inability to tolerate an oral diet.  Now concerned with esophageal perforation, so Pharmacy consulted to manage TPN.  Per documentation, patient has been eating minimally for the past several months and weight decreased from 64.9 to 58.2 kg, now up with fluid resuscitation.  GI: hx colitis/gastritis/FTT.  Baseline prealbumin low at < 5, NG O/P 21m.  Pepcid in TPN.  PPI IV added overnight Endo: DM (no PTA med) - CBGs trended up with TPN but < 180 (also on stress steroid) Insulin requirements in the past 24 hours: 2 units SSI Lytes: mild refeeding - Phos 3.9 > 2.9, high CL and low CO2, others WNL Renal: SCr down 1.25 (BL SCr 0.6), BUN 30 - UOP 0.3 ml/kg/hr, NS at 100 ml/hr, net +11.2L Pulm: FiO2 40%, CT O/P down to 149m- HC Cards: HTN - MAP 70-90s, tachy improving, CVP 8 - off pressor Hepatobil: AST up to 51, alk phos 341, tbili down to 1.3, INR 2.08, TG WNL Neuro: Fentanyl gtt at 70 - GCS 12, CPOT 0, RASS -1 ID: Vanc/Zosyn for Enterococcal and GNR empyema, tPA/Pulmozyme - afebrile, WBC down 16.9, LA 4.4 TPN Access: CVC triple lumen placed 03/04/18 TPN start date: 03/06/18  Nutritional Goals (per RD rec on 03/06/18): 1475 kCal, 90-100gm protein, 1.5L fluid per day  Current Nutrition:  TPN   Plan:  Increase TPN to 50 ml/hr (goal 70 ml/hr) Hold 20% ILE for first 7 days for ICU patients per ASPEN guidelines (start date 06/13/17) TPN will provide 72g AA and 228g CHO for a total of 1063 kCal, meeting ~70% of patient's needs Electrolytes in TPN: max  acetate - increase lytes with increasing TPN rate Daily multivitamin and trace elements in TPN  Change to moderate SSI Q4H with increasing TPN rate Remove Pepcid from TPN since patient was started on Protonix Reduce NS to 50 ml/hr when new TPN bag starts.  Consider changing to LR given hyperchloremia. KPhos 10 mmol IV x 1 F/U AM labs, Gastrografin study, tapering/discontinuing steroid   Kayleeann Huxford D. DaMina MarblePharmD, BCPS, BCCliffdell1/07/2017, 7:28 AM

## 2018-03-07 NOTE — Progress Notes (Signed)
Pharmacy Antibiotic Note  Maria Avila is a 67 y.o. female admitted on 03/04/2018 with an empyema and septic shock. Previously on vancomycin and Zosyn. Pleural fluid culture now growing E. coli and Enterococcus avium. Pharmacy consulted to dose Unasyn. WBC down 16.9, current temp 96.6. Scr trending down, now 1.25 and estimated CrCl ~37 mL/min.  Plan: D/c vancomycin and Zosyn Start Unasyn 3g IV q8h F/u clinical status, renal function, LOT  Height: 5\' 4"  (162.6 cm) Weight: 139 lb 8.8 oz (63.3 kg) IBW/kg (Calculated) : 54.7  Temp (24hrs), Avg:98 F (36.7 C), Min:96.3 F (35.7 C), Max:99.5 F (37.5 C)  Recent Labs  Lab 03/04/18 0851 03/04/18 0923 03/04/18 1238 03/04/18 1742 03/04/18 1746 03/04/18 2251 03/05/18 0030 03/05/18 0402 03/05/18 0523 03/06/18 0408 03/07/18 0352  WBC 24.2*  --   --  30.2*  --   --   --  24.6*  --  20.1* 16.9*  CREATININE 1.96*  --   --  1.97*  --   --  1.95* 1.94*  --  1.49* 1.25*  LATICACIDVEN  --  3.08* 3.03*  --  4.8* 6.7*  --   --  4.4*  --   --     Antimicrobials this admission: Flagyl 10/31 x1 Cefepime 10/31 x1 Vancomycin 10/31 >> 11/3 Zosyn 10/31 >> 11/3 Unasyn 11/3 >>  Microbiology results: 10/31 MRSA PCR: negative 10/31 BCx: NG x3 10/31 TA cx: multiple organisms 10/31 pleural fluid cx: E.coli (S-Unasyn), Enterococcus avium (S-amp)  Erin N. Gerarda Fraction, PharmD PGY2 Infectious Diseases Pharmacy Resident Phone: [phone] 03/07/2018 9:56 AM

## 2018-03-08 ENCOUNTER — Inpatient Hospital Stay (HOSPITAL_COMMUNITY): Payer: Medicare Other

## 2018-03-08 DIAGNOSIS — E440 Moderate protein-calorie malnutrition: Secondary | ICD-10-CM

## 2018-03-08 LAB — GLUCOSE, CAPILLARY
GLUCOSE-CAPILLARY: 112 mg/dL — AB (ref 70–99)
GLUCOSE-CAPILLARY: 126 mg/dL — AB (ref 70–99)
GLUCOSE-CAPILLARY: 109 mg/dL — AB (ref 70–99)
Glucose-Capillary: 132 mg/dL — ABNORMAL HIGH (ref 70–99)
Glucose-Capillary: 132 mg/dL — ABNORMAL HIGH (ref 70–99)
Glucose-Capillary: 129 mg/dL — ABNORMAL HIGH (ref 70–99)

## 2018-03-08 LAB — BASIC METABOLIC PANEL
Anion gap: 5 (ref 5–15)
Anion gap: 3 — ABNORMAL LOW (ref 5–15)
BUN: 23 mg/dL (ref 8–23)
BUN: 25 mg/dL — ABNORMAL HIGH (ref 8–23)
CALCIUM: 8 mg/dL — AB (ref 8.9–10.3)
CHLORIDE: 116 mmol/L — AB (ref 98–111)
CHLORIDE: 113 mmol/L — AB (ref 98–111)
CO2: 20 mmol/L — AB (ref 22–32)
CO2: 24 mmol/L (ref 22–32)
CREATININE: 0.75 mg/dL (ref 0.44–1.00)
Calcium: 8 mg/dL — ABNORMAL LOW (ref 8.9–10.3)
Creatinine, Ser: 0.77 mg/dL (ref 0.44–1.00)
GFR calc Af Amer: >60 mL/min (ref >60)
GFR calc non Af Amer: >60 mL/min (ref >60)
GFR calc non Af Amer: >60 mL/min (ref >60)
Glucose, Bld: 107 mg/dL — ABNORMAL HIGH (ref 70–99)
Glucose, Bld: 117 mg/dL — ABNORMAL HIGH (ref 70–99)
Potassium: 5.8 mmol/L — ABNORMAL HIGH (ref 3.5–5.1)
Potassium: 5.6 mmol/L — ABNORMAL HIGH (ref 3.5–5.1)
SODIUM: 141 mmol/L (ref 135–145)
Sodium: 140 mmol/L (ref 135–145)

## 2018-03-08 LAB — CBC WITH DIFFERENTIAL/PLATELET
ABS IMMATURE GRANULOCYTES: 0.56 10*3/uL — AB (ref 0.00–0.07)
Basophils Absolute: 0 10*3/uL (ref 0.0–0.1)
Basophils Relative: 0 %
Eosinophils Absolute: 0 10*3/uL (ref 0.0–0.5)
Eosinophils Relative: 0 %
HCT: 24.2 % — ABNORMAL LOW (ref 36.0–46.0)
HEMOGLOBIN: 8.5 g/dL — AB (ref 12.0–15.0)
IMMATURE GRANULOCYTES: 4 %
LYMPHS PCT: 15 %
Lymphs Abs: 1.9 10*3/uL (ref 0.7–4.0)
MCH: 31 pg (ref 26.0–34.0)
MCHC: 35.1 g/dL (ref 30.0–36.0)
MCV: 88.3 fL (ref 80.0–100.0)
MONO ABS: 0.7 10*3/uL (ref 0.1–1.0)
Monocytes Relative: 5 %
NEUTROS ABS: 9.9 10*3/uL — AB (ref 1.7–7.7)
NEUTROS PCT: 76 %
Platelets: 102 10*3/uL — ABNORMAL LOW (ref 150–400)
RBC: 2.74 MIL/uL — ABNORMAL LOW (ref 3.87–5.11)
RDW: 15.6 % — ABNORMAL HIGH (ref 11.5–15.5)
WBC: 13.1 10*3/uL — ABNORMAL HIGH (ref 4.0–10.5)
nRBC: 0.3 % — ABNORMAL HIGH (ref 0.0–0.2)

## 2018-03-08 LAB — PREALBUMIN: Prealbumin: <5 mg/dL — ABNORMAL LOW (ref 18–38)

## 2018-03-08 LAB — MAGNESIUM: MAGNESIUM: 1.7 mg/dL (ref 1.7–2.4)

## 2018-03-08 LAB — COMPREHENSIVE METABOLIC PANEL
ALBUMIN: 1.4 g/dL — AB (ref 3.5–5.0)
ALT: 47 U/L — ABNORMAL HIGH (ref 0–44)
AST: 56 U/L — AB (ref 15–41)
Alkaline Phosphatase: 380 U/L — ABNORMAL HIGH (ref 38–126)
Anion gap: 5 (ref 5–15)
BUN: 24 mg/dL — AB (ref 8–23)
CHLORIDE: 117 mmol/L — AB (ref 98–111)
CO2: 21 mmol/L — AB (ref 22–32)
Calcium: 8.2 mg/dL — ABNORMAL LOW (ref 8.9–10.3)
Creatinine, Ser: 0.93 mg/dL (ref 0.44–1.00)
GFR calc Af Amer: >60 mL/min (ref >60)
GFR calc non Af Amer: >60 mL/min (ref >60)
Glucose, Bld: 149 mg/dL — ABNORMAL HIGH (ref 70–99)
POTASSIUM: 2.6 mmol/L — AB (ref 3.5–5.1)
SODIUM: 143 mmol/L (ref 135–145)
Total Bilirubin: 0.8 mg/dL (ref 0.3–1.2)
Total Protein: 4.6 g/dL — ABNORMAL LOW (ref 6.5–8.1)

## 2018-03-08 LAB — PHOSPHORUS: PHOSPHORUS: 2.7 mg/dL (ref 2.5–4.6)

## 2018-03-08 LAB — TRIGLYCERIDES: Triglycerides: 49 mg/dL (ref <150)

## 2018-03-08 LAB — PROTIME-INR
INR: 1.45
PROTHROMBIN TIME: 17.5 s — AB (ref 11.4–15.2)

## 2018-03-08 MED ORDER — POTASSIUM CHLORIDE 10 MEQ/50ML IV SOLN
10.0000 meq | INTRAVENOUS | Status: AC
  Administered 2018-03-08 (×6): 10 meq via INTRAVENOUS
  Filled 2018-03-08 (×6): qty 50

## 2018-03-08 MED ORDER — ENOXAPARIN SODIUM 40 MG/0.4ML ~~LOC~~ SOLN
40.0000 mg | SUBCUTANEOUS | Status: DC
  Administered 2018-03-08 – 2018-03-10 (×3): 40 mg via SUBCUTANEOUS
  Filled 2018-03-08 (×3): qty 0.4

## 2018-03-08 MED ORDER — ORAL CARE MOUTH RINSE
15.0000 mL | Freq: Two times a day (BID) | OROMUCOSAL | Status: DC
  Administered 2018-03-09 – 2018-03-16 (×8): 15 mL via OROMUCOSAL

## 2018-03-08 MED ORDER — TRAVASOL 10 % IV SOLN
INTRAVENOUS | Status: AC
  Administered 2018-03-08: 19:00:00 via INTRAVENOUS
  Filled 2018-03-08: qty 720

## 2018-03-08 MED ORDER — CHLORHEXIDINE GLUCONATE 0.12 % MT SOLN
15.0000 mL | Freq: Two times a day (BID) | OROMUCOSAL | Status: DC
  Administered 2018-03-09 – 2018-03-17 (×15): 15 mL via OROMUCOSAL
  Filled 2018-03-08 (×10): qty 15

## 2018-03-08 MED ORDER — MAGNESIUM SULFATE 2 GM/50ML IV SOLN
2.0000 g | Freq: Once | INTRAVENOUS | Status: AC
  Administered 2018-03-08: 2 g via INTRAVENOUS
  Filled 2018-03-08: qty 50

## 2018-03-08 NOTE — Procedures (Signed)
Extubation Procedure Note  Patient Details:   Name: Maria Avila DOB: 1950/06/17 MRN: [id number]   Airway Documentation:    Vent end date: 03/08/18 Vent end time: 1215   Evaluation  O2 sats: stable throughout Complications: No apparent complications Patient did tolerate procedure well. Bilateral Breath Sounds: Rhonchi, Diminished   Yes   RT extubated patient to 3L [place] per MD order with RN at bedside. Positive cuff leak noted. No stridor noted. RT will continue to monitor as needed.   Vernona Rieger 03/08/2018, 12:19 PM

## 2018-03-08 NOTE — Progress Notes (Signed)
CRITICAL VALUE ALERT  Critical Value:  K = 2.6  Date & Time Notied:  03/08/18  Provider Notified: E-linkl  Orders Received/Actions taken: to write replacement orders

## 2018-03-08 NOTE — Progress Notes (Signed)
Repeat Bmet tonight showed a K of 5.8 but specimen hemolyzed so not accurate. Will plan to follow up K with CMet tomorrow morning and adjust TPN and supplements accordingly.  Elenor Quinones, PharmD, BCPS Clinical Pharmacist Phone number [phone] 03/08/2018 8:00 PM

## 2018-03-08 NOTE — Progress Notes (Signed)
PHARMACY - ADULT TOTAL PARENTERAL NUTRITION CONSULT NOTE   Pharmacy Consult:  TPN Indication:  Possible esophageal perforation  Patient Measurements: Height: 5' 4"  (162.6 cm) Weight: 138 lb 3.7 oz (62.7 kg) IBW/kg (Calculated) : 54.7 TPN AdjBW (KG): 54.7 Body mass index is 23.73 kg/m.  Assessment:  41 YOF with moderate malnutrition presented on 03/04/18 with AMS.  Patient was recently hospitalized for abdominal pain, failure to thrive and inability to tolerate an oral diet.  Now concerned with esophageal perforation, so Pharmacy consulted to manage TPN.  Per documentation, patient has been eating minimally for the past several months and weight decreased from 64.9 to 58.2 kg, now up with fluid resuscitation.  GI: hx colitis/gastritis/FTT.  Baseline prealbumin low at < 5, NG O/P 156m. PPI IV. Endo: DM (no PTA med) - CBGs < 180 (stress steroid d/c) Insulin requirements in the past 24 hours: 6 units SSI Lytes: Pt with refeeding - K down to 2.6, Phos relatively stable at low end of nl 2.7, Mg 1.7, high CL and low CO2 Renal: SCr down 0.93 (BL SCr 0.6), BUN 24 - UOP 2.6 ml/kg/hr, NS at 50 ml/hr, net +11L Pulm: FiO2 30%, CT O/P 2767mCards: HTN - MAP 70-90s,still some tachy, CVP 6 - off pressors Hepatobil: AST up to 56, ALT up to 47, alk phos 380, tbili back down to nl, INR down to 1.45, TG WNL Neuro: Fentanyl gtt at 70 - GCS 12, CPOT 0-2, RASS -1 ID: Vanc/Zosyn for Enterococcal and GNR empyema, tPA/Pulmozyme - afebrile, WBC down 13.1, LA 4.4 TPN Access: CVC triple lumen placed 03/04/18 TPN start date: 03/06/18  Nutritional Goals (per RD rec on 03/06/18): 1475 kCal, 90-100gm protein, 1.5L fluid per day  Current Nutrition:  TPN   Plan:  Continue TPN at 50 ml/hr (goal 70 ml/hr). Will increase to goal when lytes stable. Hold 20% ILE for first 7 days for ICU patients per ASPEN guidelines (start date 03/06/18) TPN will provide 72g AA and 228g CHO for a total of 1063 kCal, meeting ~70% of  patient's needs Electrolytes in TPN: continue max acetate. Will increase K, Phos, and Mg in TPN. Magnesium 2gm IV x 1 KCl 101mruns x 6 (ordered by MD). Will f/u recheck this evening. Daily multivitamin and trace elements in TPN  Moderate SSI Q4H F/U PM K, AM labs and Gastrografin study  CarSherlon HandingharmD, BCPS Clinical pharmacist  **Pharmacist phone directory can now be found on [email or web address] ([email or web address]).  Listed under [facility] [place]1/08/2017, 7:13 AM

## 2018-03-08 NOTE — Progress Notes (Signed)
[place] Progress Note Patient Name: Maria Avila DOB: 1951/02/26 MRN: [id number]   Date of Service  03/08/2018  HPI/Events of Note  K+ = 2.6 and Creatinine = 0.93   eICU Interventions  Will replace K+.     Intervention Category Major Interventions: Electrolyte abnormality - evaluation and management  Wrenley Sayed [place] 03/08/2018, 6:32 AM

## 2018-03-08 NOTE — Progress Notes (Signed)
200 mls fentanyl wasted and witnessed with Sharyn Dross RN

## 2018-03-08 NOTE — Progress Notes (Signed)
NAME:  Maria Avila, MRN:  [id number], DOB:  06/20/1950, LOS: 4 ADMISSION DATE:  03/04/2018, CONSULTATION DATE:  03/04/2018 REFERRING MD:  Dr. Vanita Panda, CHIEF COMPLAINT:  AMS  Brief History   67 year old female presenting from [facility] for 2 day h/o of less responsive than baseline. Large loculated L pleural effusion, s/p CT on 10/31. Off pressors 11/3.  Past Medical History  DMT2, HTN, unexplained FTT despite extensive work-up. Significant Hospital Events   10/31 admitted, CT placed, CVTS consulted 11/3 weaned off pressors  Consults: date of consult/date signed off & final recs:  CVTS: 10/31  Procedures (surgical and bedside):  10/31 ETT placed 10/31 Chest tube placed] 11/1 TPA/pulmozyme administration  Significant Diagnostic Tests:  Chest CT 10/31 with effusion and air in pleural space CXR 11/2: near complete resolution of the left pleural effusion. CXR 11/4: persistent consolidation Left lung base Micro Data:  Blood 10/31: NG 3 days Ucx: multiple species present Tracheal aspirate: few multiple organisms present Pleural fluid: E. Coli, enteroccoccus avium. Sensitive to all but ampicillin MRSA PCR: neg  Antimicrobials:  Flagyl 10/31-10/31 (1 dose) Vancomycin 10/31>>> 11/3 Zosyn 10/31>>> 11/3 Unasyn 11/4  Subjective:  162ml out overnight, 10 ml today.  Awakens spontaneously off sedation, appears comfortable  Objective   Blood pressure (!) 133/104, pulse (!) 103, temperature (!) 96.7 F (35.9 C), temperature source Axillary, resp. rate (!) 22, height 5\' 4"  (1.626 m), weight 62.7 kg, SpO2 99 %. CVP:  [6 mmHg-9 mmHg] 6 mmHg  Vent Mode: CPAP;PSV FiO2 (%):  [30 %] 30 % Set Rate:  [24 bmp] 24 bmp Vt Set:  [320 mL] 320 mL PEEP:  [5 cmH20] 5 cmH20 Pressure Support:  [5 cmH20] 5 cmH20 Plateau Pressure:  [9 cmH20-14 cmH20] 10 cmH20   Intake/Output Summary (Last 24 hours) at 03/08/2018 0826 Last data filed at 03/08/2018 0800 Gross per 24 hour  Intake 3197.8  ml  Output 4465 ml  Net -1267.2 ml   Filed Weights   03/06/18 0500 03/07/18 0413 03/08/18 0500  Weight: 60.8 kg 63.3 kg 62.7 kg    Examination: General: 67 year old AA female. Intubated. Awakens spontaneously. GCS 11T HENT: dry MM, ETT in place and NGT in place without pressure ulceration. Small amount of drainage of gastric contents (159ml/shift) Lungs: chest clear with no ventilator asynchrony, tolerating PS 5/5 with SBI 30. L chest tube draining yellow fluid. No air leak. Cardiovascular: irregular rhythm, S1S2 normal. Warm extremities. Palpable peripheral pulses. R fem arterial line.  Abdomen: soft, non-tender, non-distended Extremities: 2+ pitting edema BLE. Line sites intact. Neuro: can follow commands GU: foley catheter in place. No s/s of skin breakdown  Resolved Hospital Problem list   none  Assessment & Plan:  Septic shock now resolved. Off pressors and sedation. Possibly alert enough to attempt extubation. L chest empyema possibly 22/ esophageal perf. Transitioned to unasyn on 11/3. K down to 2.6. Attempt gastrografin study when able. Alb 1.4, severe protein calorie malnutrition.   PLAN: Continue Unasyn (11/3 - ) Likely attempt extubation D/C art line Gastrografin study at 7 day mark NPO and NGT Continue TPN IV K repletion  Disposition / Summary of Today's Plan 03/08/18   Attempt SBT. Chest tube to remain in place until gastrografin study.    Diet: NPO strict! suppository for constipation. Pain/Anxiety/Delirium protocol (if indicated): fentanyl gtt VAP protocol (if indicated): in place DVT prophylaxis: SCDs GI prophylaxis: protonix iv Hyperglycemia protocol: [place] correction scale. Mobility: bedrest. Antibiotic de-escalation: Simplified to Unasyn.  Code Status: partial, ok for intubation. No cpr Family Communication: at bedside  Labs (personally reviewed)  CBC: Recent Labs  Lab 03/04/18 0851 03/04/18 1742 03/05/18 0402 03/06/18 0408 03/07/18 0352  03/08/18 0414  WBC 24.2* 30.2* 24.6* 20.1* 16.9* 13.1*  NEUTROABS 18.2*  --   --  16.2* 14.5* 9.9*  HGB 10.4* 12.3 9.6* 9.6* 8.6* 8.5*  HCT 31.5* 35.8* 29.0* 28.8* 24.7* 24.2*  MCV 95.2 90.2 92.1 90.3 88.5 88.3  PLT 260 304 220 183 121* 102*    Basic Metabolic Panel: Recent Labs  Lab 03/04/18 1742 03/05/18 0030 03/05/18 0402 03/06/18 0408 03/07/18 0352 03/08/18 0414  NA 136 136 136 137 137 143  K 3.5 3.9 3.7 3.1* 3.5 2.6*  CL 108 111 111 115* 117* 117*  CO2 16* 14* 15* 17* 17* 21*  GLUCOSE 156* 137* 139* 121* 180* 149*  BUN 30* 30* 31* 33* 30* 24*  CREATININE 1.97* 1.95* 1.94* 1.49* 1.25* 0.93  CALCIUM 8.4* 8.1* 8.1* 7.9* 8.1* 8.2*  MG 1.5*  --  1.5* 1.9 2.0 1.7  PHOS 4.5  --  4.9* 3.9 2.9 2.7   GFR: Estimated Creatinine Clearance: 50 mL/min (by C-G formula based on SCr of 0.93 mg/dL). Recent Labs  Lab 03/04/18 1238 03/04/18 1742 03/04/18 1746 03/04/18 2251 03/05/18 0402 03/05/18 0523 03/06/18 0408 03/07/18 0352 03/08/18 0414  PROCALCITON  --  24.42  --   --   --   --   --   --   --   WBC  --  30.2*  --   --  24.6*  --  20.1* 16.9* 13.1*  LATICACIDVEN 3.03*  --  4.8* 6.7*  --  4.4*  --   --   --     Liver Function Tests: Recent Labs  Lab 03/04/18 0851 03/04/18 1742 03/06/18 0408 03/07/18 0352 03/08/18 0414  AST 31 34 29 51* 56*  ALT 24 28 25  37 47*  ALKPHOS 110 153* 203* 341* 380*  BILITOT 1.5* 2.1* 1.7* 1.3* 0.8  PROT 4.1* 4.7* 4.9* 4.5* 4.6*  ALBUMIN 1.3* 1.3* 1.6* 1.4* 1.4*   Recent Labs  Lab 03/04/18 1742  LIPASE 20  AMYLASE 59   No results for input(s): AMMONIA in the last 168 hours.  ABG    Component Value Date/Time   PHART 7.334 (L) 03/06/2018 0406   PCO2ART 32.8 03/06/2018 0406   PO2ART 157.0 (H) 03/06/2018 0406   HCO3 17.6 (L) 03/06/2018 0406   TCO2 19 (L) 03/06/2018 0406   ACIDBASEDEF 8.0 (H) 03/06/2018 0406   O2SAT 99.0 03/06/2018 0406     Coagulation Profile: Recent Labs  Lab 03/04/18 1742 03/05/18 0944 03/08/18 0414    INR 1.95 2.08 1.45    Cardiac Enzymes: No results for input(s): CKTOTAL, CKMB, CKMBINDEX, TROPONINI in the last 168 hours.  HbA1C: No results found for: HGBA1C  CBG: Recent Labs  Lab 03/07/18 1505 03/07/18 1922 03/07/18 2339 03/08/18 0407 03/08/18 0706  GLUCAP 130* 144* 140* 132* 112*

## 2018-03-09 ENCOUNTER — Inpatient Hospital Stay (HOSPITAL_COMMUNITY): Payer: Medicare Other

## 2018-03-09 ENCOUNTER — Encounter (HOSPITAL_COMMUNITY): Payer: Self-pay | Admitting: Radiology

## 2018-03-09 DIAGNOSIS — I6340 Cerebral infarction due to embolism of unspecified cerebral artery: Secondary | ICD-10-CM

## 2018-03-09 DIAGNOSIS — R6520 Severe sepsis without septic shock: Secondary | ICD-10-CM

## 2018-03-09 DIAGNOSIS — I63412 Cerebral infarction due to embolism of left middle cerebral artery: Secondary | ICD-10-CM

## 2018-03-09 LAB — COMPREHENSIVE METABOLIC PANEL
ALBUMIN: 1.3 g/dL — AB (ref 3.5–5.0)
ALK PHOS: 475 U/L — AB (ref 38–126)
ALT: 50 U/L — ABNORMAL HIGH (ref 0–44)
ANION GAP: 4 — AB (ref 5–15)
AST: 55 U/L — ABNORMAL HIGH (ref 15–41)
BILIRUBIN TOTAL: 0.8 mg/dL (ref 0.3–1.2)
BUN: 19 mg/dL (ref 8–23)
CALCIUM: 8.1 mg/dL — AB (ref 8.9–10.3)
CO2: 24 mmol/L (ref 22–32)
Chloride: 115 mmol/L — ABNORMAL HIGH (ref 98–111)
Creatinine, Ser: 0.66 mg/dL (ref 0.44–1.00)
GFR calc Af Amer: >60 mL/min (ref >60)
GFR calc non Af Amer: >60 mL/min (ref >60)
GLUCOSE: 123 mg/dL — AB (ref 70–99)
Potassium: 3.1 mmol/L — ABNORMAL LOW (ref 3.5–5.1)
Sodium: 143 mmol/L (ref 135–145)
TOTAL PROTEIN: 4.4 g/dL — AB (ref 6.5–8.1)

## 2018-03-09 LAB — BODY FLUID CULTURE

## 2018-03-09 LAB — CBC WITH DIFFERENTIAL/PLATELET
BASOS PCT: 0 %
Basophils Absolute: 0 10*3/uL (ref 0.0–0.1)
EOS ABS: 0.1 10*3/uL (ref 0.0–0.5)
Eosinophils Relative: 1 %
HCT: 24.6 % — ABNORMAL LOW (ref 36.0–46.0)
Hemoglobin: 8.2 g/dL — ABNORMAL LOW (ref 12.0–15.0)
Lymphocytes Relative: 7 %
Lymphs Abs: 0.9 10*3/uL (ref 0.7–4.0)
MCH: 30 pg (ref 26.0–34.0)
MCHC: 33.3 g/dL (ref 30.0–36.0)
MCV: 90.1 fL (ref 80.0–100.0)
MONOS PCT: 1 %
Monocytes Absolute: 0.1 10*3/uL (ref 0.1–1.0)
NEUTROS PCT: 91 %
NRBC: 0 /100{WBCs}
Neutro Abs: 11.3 10*3/uL — ABNORMAL HIGH (ref 1.7–7.7)
PLATELETS: 122 10*3/uL — AB (ref 150–400)
RBC: 2.73 MIL/uL — AB (ref 3.87–5.11)
RDW: 15.6 % — AB (ref 11.5–15.5)
WBC: 12.4 10*3/uL — ABNORMAL HIGH (ref 4.0–10.5)
nRBC: 0.4 % — ABNORMAL HIGH (ref 0.0–0.2)

## 2018-03-09 LAB — CULTURE, BLOOD (ROUTINE X 2)
CULTURE: NO GROWTH
Culture: NO GROWTH
Culture: NO GROWTH
SPECIAL REQUESTS: ADEQUATE
Special Requests: ADEQUATE

## 2018-03-09 LAB — GLUCOSE, CAPILLARY
GLUCOSE-CAPILLARY: 111 mg/dL — AB (ref 70–99)
GLUCOSE-CAPILLARY: 91 mg/dL (ref 70–99)
Glucose-Capillary: 132 mg/dL — ABNORMAL HIGH (ref 70–99)
Glucose-Capillary: 120 mg/dL — ABNORMAL HIGH (ref 70–99)
Glucose-Capillary: 142 mg/dL — ABNORMAL HIGH (ref 70–99)
Glucose-Capillary: 120 mg/dL — ABNORMAL HIGH (ref 70–99)

## 2018-03-09 LAB — CHOLESTEROL, BODY FLUID: Cholesterol, Fluid: 39 mg/dL

## 2018-03-09 LAB — MAGNESIUM: MAGNESIUM: 2 mg/dL (ref 1.7–2.4)

## 2018-03-09 LAB — PHOSPHORUS: Phosphorus: 1.7 mg/dL — ABNORMAL LOW (ref 2.5–4.6)

## 2018-03-09 MED ORDER — INSULIN ASPART 100 UNIT/ML ~~LOC~~ SOLN
0.0000 [IU] | Freq: Four times a day (QID) | SUBCUTANEOUS | Status: DC
  Administered 2018-03-09 – 2018-03-10 (×3): 2 [IU] via SUBCUTANEOUS

## 2018-03-09 MED ORDER — POTASSIUM CHLORIDE 10 MEQ/50ML IV SOLN
10.0000 meq | INTRAVENOUS | Status: AC
  Administered 2018-03-09 (×4): 10 meq via INTRAVENOUS
  Filled 2018-03-09 (×4): qty 50

## 2018-03-09 MED ORDER — TRAVASOL 10 % IV SOLN
INTRAVENOUS | Status: AC
  Administered 2018-03-09: 17:00:00 via INTRAVENOUS
  Filled 2018-03-09: qty 720

## 2018-03-09 MED ORDER — IOPAMIDOL (ISOVUE-370) INJECTION 76%
100.0000 mL | Freq: Once | INTRAVENOUS | Status: AC | PRN
  Administered 2018-03-09: 100 mL via INTRAVENOUS

## 2018-03-09 MED ORDER — ASPIRIN 300 MG RE SUPP
300.0000 mg | Freq: Every day | RECTAL | Status: DC
  Administered 2018-03-10: 300 mg via RECTAL
  Filled 2018-03-09: qty 1

## 2018-03-09 MED ORDER — POTASSIUM PHOSPHATES 15 MMOLE/5ML IV SOLN
20.0000 mmol | Freq: Once | INTRAVENOUS | Status: AC
  Administered 2018-03-09: 20 mmol via INTRAVENOUS
  Filled 2018-03-09: qty 6.67

## 2018-03-09 NOTE — Code Documentation (Addendum)
67 yo [place] female coming from [facility] on [address] admitted for empyema on the left side. Pt had respiratory failure and was admitted to ICU. Neurology was consulted today with complaints of left gaze, right sided weakness, nonverbal, and not following commands while weaning off of sedation. RN reports that she was bilaterally weak in her legs yesterday, but today when she weaned her off fentanyl at 0800 she noted some right sided weakness. At 1200, pt was noted to become less verbal and have left sided gaze. Called Neurology and Neurology called this RN at 1630 to help facilitate patient treatment. Pt was noted to have a NIHSS 21 due to left gaze, right side weakness, bilateral leg weakness, inability to follow commands and inability to speak. Pt was a hard stick and staff was unable to get an IV. IV Team was called and came to attempt. Unable to obtain peripheral IV. Neurology consulted and order for Midline placed. IV Team placed midline. Pt brought to CT for CT Perfusion. CT Perfusion negative. Pt taken back to [facility]. CTA showed left parietal lobe subacute infarct. Handoff given to [place], Therapist, sports.

## 2018-03-09 NOTE — Progress Notes (Signed)
Dr Kris Mouton in to talk with family about CT results

## 2018-03-09 NOTE — Progress Notes (Addendum)
CT head showing left parietal stroke. Neurology paged and were immediately at bedside. Likely subacute stroke, given demarcation. Right arm with rigidity, withdraws from pain. Eyes looking away from affected side. Will get EEG to rule out seizure.  Neurology eval pending, will follow up EEG. Discussed findings and plan with family at bedside. All questions answered.  [name] MD PGY-2 Family Medicine Resident

## 2018-03-09 NOTE — Progress Notes (Addendum)
Discussed case with dr. Thereasa Solo from [facility]. Patient is stable for floor and they are in agreement to take over care on 11/6. Speech eval and swallow study to occur 11/6. To pull chest tube pending that test. Patient with some residual right sided weakness after extubation. Will get ct head to further evaluate.  [name] MD PGY-2 Family Medicine Resident

## 2018-03-09 NOTE — Progress Notes (Signed)
Patient is alert but nonverbal and gazes to left side Right side weakness noted .Dr Wonda Amis informed and in room to assess. Stat CT ordered

## 2018-03-09 NOTE — Progress Notes (Signed)
Nutrition Follow-up  DOCUMENTATION CODES:   Non-severe (moderate) malnutrition in context of chronic illness  INTERVENTION:  -TPN per pharmacy   -Recommend adjusting TPN based on new estimated needs (see below) and advancing as appropriate   -Recommend starting ILE with TPN as pt to be transferred out of ICU   -Continue monitoring labs for refeeding (potassium, magnesium, phosphorus)   NUTRITION DIAGNOSIS:   Moderate Malnutrition related to chronic illness(chronic colitis) as evidenced by moderate muscle depletion, moderate fat depletion, mild fat depletion, percent weight loss(10% weight loss within 6 months). Ongoing  GOAL:   Patient will meet greater than or equal to 90% of their needs Progressing. TPN not at full rate   MONITOR:   Vent status, Skin, Labs, I & O's    ASSESSMENT:   67 yo female with PMH of HTN, colitis, DM, and malnutrition who was admitted on 10/31 with AMS, left empyema. Intubated and chest tube placed on 10/31.  CVC triple lumen placed 10/31.  TPN start date 11/2.  Pt extubated 11/4 @ 1215 to 2L [place].  Per pharmacy TPN currently at 50 ml/ hr providing 1063 kcal and 72 grams protein.  TPN to advance to 70 ml/hr once electrolytes stable.   Current TPN goal rate meets estimated needs based on ventilator status. As pt now extubated see new estimated needs below, adjust and advance as appropriate.   Pharmacy holding ILE 20% for 7 days per ASPEN guidelines; To add 11/9 or when pt transfers out of ICU. Recommend starting ILE as pt no longer on ventilator support.   CT head today showing left parietal stroke. Per MD, Plan for 11/6- pt to be transferred out of ICU, speech eval and swallow study, if successful pulling chest tube.  Meds: novolog ss, Protonix, TPN w/ electrolytes and MVI Labs:  CBGs 109-132, potassium 3.1, magnesium 2.0, phosphorus 1.7    Diet Order:   Diet Order            Diet NPO time specified  Diet effective now               EDUCATION NEEDS:   No education needs have been identified at this time  Skin:  Skin Assessment: Skin Integrity Issues: Skin Integrity Issues:: Stage III, Other Stage III: buttocks/ischium  Other: wound to perineum  Last BM:  11/5  Height:   Ht Readings from Last 1 Encounters:  03/04/18 5\' 4"  (1.626 m)    Weight:   Wt Readings from Last 1 Encounters:  03/09/18 62.7 kg    Ideal Body Weight:  54.5 kg  BMI:  Body mass index is 23.73 kg/m.  Estimated Nutritional Needs:   Kcal:  1700-1900  Protein:  95-110 gms  Fluid:  >/=1.7 L    Maria Avila, Dietetic Intern [phone]

## 2018-03-09 NOTE — Consult Note (Addendum)
Neurology Consultation  Reason for Consult: New subacute/acute stroke Referring Physician: Mannam History is obtained from: Chart  HPI: Maria Avila is a 67 y.o. female with history of malnutrition, hypertension, diabetes and colitis ,who was admitted for empyema.  Apparently patient presented from SNF with septic shock and respiratory failure with chest CT chest that was reviewed and showed free air and large amount of fluid.  She had a recent EGD and there is concern for esophageal rupture which caused the empyema.  Patient also had a CT of her head which did not show any acute stroke at that time.  Patient has been intubated since October 31 and was extubated today.  It was noted that she was not moving her right side and had a left gaze preference.  She was sent down for CT scan which showed a acute/subacute infarct in the left parietal lobe consistent with MCA branch vessel infarction.  For that reason neurology was consulted.  At present time patient is extubated, mumbles but apparently this morning she was actually following commands and talking in short sentences.  Upon entering the room patient's eyes are conjugate looking forward however at times they do roved to the left she is not following commands she does blink to threat.  She is using her left arm to reach for noxious stimuli and the leads.  She does have a tremor on her right arm and at times it is normal tone and at other times it has increased tone with clenched fingers.  I did note at those times her eyes would be veered to the left.     LKW: As patient was intubated last known well is unknown tpa given?: no, time of stroke is unknown Premorbid modified Rankin scale (mRS): 0 NIH stroke scale 20  ROS:  Unable to obtain due to altered mental status.   Past Medical History:  Diagnosis Date  . Colitis   . Diabetes mellitus without complication ([place])   . Hypertension   . Malnutrition ([place])     Family History  Problem  Relation Age of Onset  . Alzheimer's disease Mother   . Colon cancer Father      Social History:   reports that she has quit smoking. She has never used smokeless tobacco. She reports that she drank alcohol. She reports that she has current or past drug history.  Medications  Current Facility-Administered Medications:  .  0.9 %  sodium chloride infusion, 250 mL, Intravenous, Continuous, [name], MD, Stopped at 03/04/18 2139 .  0.9 %  sodium chloride infusion, , Intravenous, Continuous, [name], MD, Last Rate: 75 mL/hr at 03/09/18 1400 .  Ampicillin-Sulbactam (UNASYN) 3 g in sodium chloride 0.9 % 100 mL IVPB, 3 g, Intravenous, Q8H, Agarwala, Ravi, MD, Last Rate: 200 mL/hr at 03/09/18 1415, 3 g at 03/09/18 1415 .  bisacodyl (DULCOLAX) suppository 10 mg, 10 mg, Rectal, Daily PRN, Agarwala, Ravi, MD, 10 mg at 03/07/18 1100 .  chlorhexidine (PERIDEX) 0.12 % solution 15 mL, 15 mL, Mouth Rinse, BID, Mannam, Praveen, MD, 15 mL at 03/09/18 0909 .  enoxaparin (LOVENOX) injection 40 mg, 40 mg, Subcutaneous, Q24H, Mannam, Praveen, MD, 40 mg at 03/09/18 1158 .  insulin aspart (novoLOG) injection 0-15 Units, 0-15 Units, Subcutaneous, Q6H, Franky Macho, RPH .  MEDLINE mouth rinse, 15 mL, Mouth Rinse, q12n4p, Mannam, Praveen, MD, 15 mL at 03/09/18 1158 .  pantoprazole (PROTONIX) injection 40 mg, 40 mg, Intravenous, Q24H, Agarwala, Ravi, MD, 40 mg at  03/08/18 1518 .  TPN ADULT (ION), , Intravenous, Continuous TPN, Franky Macho, RPH, Last Rate: 50 mL/hr at 03/09/18 1400 .  TPN ADULT (ION), , Intravenous, Continuous TPN, Franky Macho, RPH   Exam: Current vital signs: BP (!) 135/102   Pulse (!) 112   Temp (!) 97.5 F (36.4 C) (Oral)   Resp 18   Ht 5\' 4"  (1.626 m)   Wt 62.7 kg   SpO2 100%   BMI 23.73 kg/m  Vital signs in last 24 hours: Temp:  [97.3 F (36.3 C)-98.1 F (36.7 C)] 97.5 F (36.4 C) (11/05 1145) Pulse Rate:  [94-125] 112 (11/05 1400) Resp:  [12-27] 18 (11/05  1400) BP: (108-146)/(69-106) 135/102 (11/05 1400) SpO2:  [97 %-100 %] 100 % (11/05 1400) Arterial Line BP: (146-157)/(86-93) 146/86 (11/04 1600) Weight:  [62.7 kg] 62.7 kg (11/05 0400)  Physical Exam  Constitutional: Appears well-developed and well-nourished.  Psych: Affect appropriate to situation Eyes: No scleral injection HENT: No OP obstrucion Head: Normocephalic.  Cardiovascular: Normal rate and regular rhythm.  Respiratory: Effort normal, non-labored breathing GI: Soft.  No distension. There is no tenderness.  Skin: WDI  Neuro: Mental Status: Patient awakens easily to sternal rub or noxious stimuli.  She does use her left arm to reach for the area of noxious stimuli.  She does not follow any commands.  She does blink to threat bilaterally at one point time she did mumble but it was incoherent. Cranial Nerves: II: Links to threat bilaterally.  III,IV, VI: Doll's eyes intact. Pupils are equal, round, and reactive to light-pupils are 6 mm and reactive most likely secondary to her cataracts V: Winces to discomfort VII: Facial movement is symmetric.  VIII: Does not follow commands to voice XII: tongue is midline   Motor: Waxing and waning increased tone on her right arm, decreased tone and flaccid on her right leg however she does withdraw against gravity to pain on her right leg.  Patient has 3/5 strength of her left arm and 3/5 strength in left leg Sensory: Withdraws from noxious stimuli in bilateral legs and left arm no response in the right arm Deep Tendon Reflexes: 2+ and symmetric in the biceps and patellae.  Plantars: Toes are downgoing bilaterally.     Labs I have reviewed labs in epic and the results pertinent to this consultation are:   CBC    Component Value Date/Time   WBC 12.4 (H) 03/09/2018 0350   RBC 2.73 (L) 03/09/2018 0350   HGB 8.2 (L) 03/09/2018 0350   HGB 11.3 (L) 07/21/2017 1131   HCT 24.6 (L) 03/09/2018 0350   HCT 23.7 (L) 02/17/2018 0205    PLT 122 (L) 03/09/2018 0350   PLT 186 07/21/2017 1131   MCV 90.1 03/09/2018 0350   MCH 30.0 03/09/2018 0350   MCHC 33.3 03/09/2018 0350   RDW 15.6 (H) 03/09/2018 0350   LYMPHSABS 0.9 03/09/2018 0350   MONOABS 0.1 03/09/2018 0350   EOSABS 0.1 03/09/2018 0350   BASOSABS 0.0 03/09/2018 0350    CMP     Component Value Date/Time   NA 143 03/09/2018 0350   K 3.1 (L) 03/09/2018 0350   CL 115 (H) 03/09/2018 0350   CO2 24 03/09/2018 0350   GLUCOSE 123 (H) 03/09/2018 0350   BUN 19 03/09/2018 0350   CREATININE 0.66 03/09/2018 0350   CREATININE 0.78 07/21/2017 1131   CALCIUM 8.1 (L) 03/09/2018 0350   PROT 4.4 (L) 03/09/2018 0350   ALBUMIN 1.3 (L) 03/09/2018  0350   AST 55 (H) 03/09/2018 0350   AST 111 (H) 07/21/2017 1131   ALT 50 (H) 03/09/2018 0350   ALT 47 07/21/2017 1131   ALKPHOS 475 (H) 03/09/2018 0350   BILITOT 0.8 03/09/2018 0350   BILITOT 1.2 07/21/2017 1131   GFRNONAA >60 03/09/2018 0350   GFRNONAA >60 07/21/2017 1131   GFRAA >60 03/09/2018 0350   GFRAA >60 07/21/2017 1131    Lipid Panel     Component Value Date/Time   CHOL 77 03/04/2018 2133   TRIG 49 03/08/2018 0414     Imaging I have reviewed the images obtained:  CT-scan of the brain--acute/subacute infarct in the left parietal lobe consistent with MCA branch vessel infarction    Etta Quill PA-C [facility] [phone]  M-F  (9:00 am- 5:00 PM)  03/09/2018, 2:16 PM   I have seen and evaluated the patient. I have reviewed the above note and made appropriate changes.    Assessment:  67 year old female presenting to the hospital initially secondary to empyema.  Patient was intubated on October 31 and extubated today 03/09/2018.  Her exam seemed out of proportion to the stroke seen on CT so I did a CT perfusion which shows what I think is some pseudonormalization.  Recommend #EEG--given the right arm intermittent clenching and flexion # MRI of the brain without contrast #MRA Head and neck   #Transthoracic Echo,  # Start patient on ASA 325mg  daily  #Start continue Atorvastatin 80 mg/other high intensity statin # BP goal: permissive HTN upto 220/120 mmHg # HBAIC and Lipid profile # Telemetry monitoring # Frequent neuro checks # NPO until passes stroke swallow screen # please page stroke NP  Or  PA  Or MD from 8am -4 pm  as this patient from this time will be  followed by the stroke.   You can look them up on [email or web address]  Password [email or web address]  Roland Rack, MD [facility] [phone]  If 7pm- 7am, please page neurology on call as listed in [place].

## 2018-03-09 NOTE — Progress Notes (Signed)
Patient returned from [place] .Dr. Wonda Amis paged and informed of CT results

## 2018-03-09 NOTE — Progress Notes (Signed)
EEG Completed; Results Pending  

## 2018-03-09 NOTE — Progress Notes (Signed)
  Subjective: Extubated Minimally interactive  Objective: Vital signs in last 24 hours: Temp:  [97.3 F (36.3 C)-98.1 F (36.7 C)] 97.3 F (36.3 C) (11/05 0718) Pulse Rate:  [94-125] 94 (11/05 0800) Cardiac Rhythm: Sinus tachycardia (11/05 0800) Resp:  [12-27] 17 (11/05 0800) BP: (108-142)/(69-101) 117/95 (11/05 0800) SpO2:  [97 %-100 %] 100 % (11/05 0800) Arterial Line BP: (126-157)/(70-93) 146/86 (11/04 1600) FiO2 (%):  [30 %] 30 % (11/04 1102) Weight:  [62.7 kg] 62.7 kg (11/05 0400)  Hemodynamic parameters for last 24 hours: CVP:  [5 mmHg-6 mmHg] 5 mmHg  Intake/Output from previous day: 11/04 0701 - 11/05 0700 In: 2738.2 [I.V.:2176; NG/GT:40; IV Piggyback:522.2] Out: 1160 [Urine:690; Emesis/NG output:350; Chest Tube:120] Intake/Output this shift: Total I/O In: 102.4 [I.V.:86.7; IV Piggyback:15.7] Out: 101 [Urine:100; Stool:1]  General appearance: no distress Heart: regular rate and rhythm Lungs: diminished breath sounds left base  Lab Results: Recent Labs    03/08/18 0414 03/09/18 0350  WBC 13.1* 12.4*  HGB 8.5* 8.2*  HCT 24.2* 24.6*  PLT 102* 122*   BMET:  Recent Labs    03/08/18 2021 03/09/18 0350  NA 140 143  K 5.6* 3.1*  CL 113* 115*  CO2 24 24  GLUCOSE 117* 123*  BUN 25* 19  CREATININE 0.77 0.66  CALCIUM 8.0* 8.1*    PT/INR:  Recent Labs    03/08/18 0414  LABPROT 17.5*  INR 1.45   ABG    Component Value Date/Time   PHART 7.334 (L) 03/06/2018 0406   HCO3 17.6 (L) 03/06/2018 0406   TCO2 19 (L) 03/06/2018 0406   ACIDBASEDEF 8.0 (H) 03/06/2018 0406   O2SAT 99.0 03/06/2018 0406   CBG (last 3)  Recent Labs    03/08/18 2301 03/09/18 0352 03/09/18 0715  GLUCAP 129* 111* 132*    Assessment/Plan:  Extubated yesterday Would go ahead with swallow to rule out esophageal perforation since questions has been raised although I think it is unlikely. More likely aspiration pneumonia/ empyema Ct without air leak, 120 ml out last 24  hours. Would keep in place until after swallow just to be safe  LOS: 5 days    [place] Nakayama 03/09/2018

## 2018-03-09 NOTE — Progress Notes (Signed)
NAME:  Maria Avila, MRN:  [id number], DOB:  [birth year], LOS: 5 ADMISSION DATE:  03/04/2018, CONSULTATION DATE:  03/04/2018 REFERRING MD:  Dr. Vanita Panda, CHIEF COMPLAINT:  AMS  Brief History   67 year old female presenting from [facility] for 2 day h/o of less responsive than baseline. Large loculated L pleural effusion, s/p CT on 10/31. Off pressors 11/3.  Past Medical History  DMT2, HTN, unexplained FTT despite extensive work-up. Significant Hospital Events   10/31 admitted, CT placed, CVTS consulted 11/3 weaned off pressors  Consults: date of consult/date signed off & final recs:  CVTS: 10/31  Procedures (surgical and bedside):  10/31 ETT placed 10/31 Chest tube placed] 11/1 TPA/pulmozyme administration  Significant Diagnostic Tests:  Chest CT 10/31 with effusion and air in pleural space CXR 11/2: near complete resolution of the left pleural effusion. CXR 11/4: persistent consolidation Left lung base Micro Data:  Blood 10/31: NG 4 days Ucx: multiple species present Tracheal aspirate: few multiple organisms present Pleural fluid: E. Coli, enteroccoccus avium. Sensitive to all but ampicillin MRSA PCR: neg  Antimicrobials:  Flagyl 10/31-10/31 (1 dose) Vancomycin 10/31>>> 11/3 Zosyn 10/31>>> 11/3 Unasyn 11/4>>>  Subjective:  179ml out last 24 hours. Alert, able to follow commands, did not speak but per nursing has talked a little since extubation  Objective   Blood pressure (!) 129/94, pulse (!) 107, temperature (!) 97.3 F (36.3 C), temperature source Oral, resp. rate 17, height 5\' 4"  (1.626 m), weight 62.7 kg, SpO2 100 %. CVP:  [0 mmHg-6 mmHg] 0 mmHg  Vent Mode: CPAP;PSV FiO2 (%):  [30 %] 30 % PEEP:  [5 cmH20] 5 cmH20 Pressure Support:  [5 cmH20] 5 cmH20   Intake/Output Summary (Last 24 hours) at 03/09/2018 0930 Last data filed at 03/09/2018 0900 Gross per 24 hour  Intake 2708.47 ml  Output 1251 ml  Net 1457.47 ml   Filed Weights   03/07/18 0413  03/08/18 0500 03/09/18 0400  Weight: 63.3 kg 62.7 kg 62.7 kg    Examination: General:67 y/o AA female. Now extubated. Awake, alert. Non-verbal this am HENT: dry MM, NG tube in place Lungs:coarse breath sounds left lungs. L chest tube draining yellow fluid. No air leak. Cardiovascular: irregular rhythm, S1S2 normal. Warm extremities. Palpable peripheral pulses. R fem arterial line.  Abdomen: soft, non-tender, non-distended Extremities: 2+ pitting edema BLE. Line sites intact. Neuro: can follow commands GU: foley catheter in place. No s/s of skin breakdown  Resolved Hospital Problem list   none  Assessment & Plan:  Septic shock now resolved. Off pressors and sedation. Doing well s/p extubation. On 2L [place]. Ok for swallow study to evaluate for esophageal perf. Will leave CT in place until after swallow. Continue unasyn. k 3.1 repleted. Phos 1.7, repleted.  PLAN: Continue Unasyn (11/3 - ) Swallow study If passes swallow, can take in by mouth and dc CT NPO and NGT Continue TPN IV K repletion IV phos repletion    Diet: NPO strict! suppository for constipation. Pain/Anxiety/Delirium protocol (if indicated): fentanyl gtt VAP protocol (if indicated): in place DVT prophylaxis: SCDs GI prophylaxis: protonix iv Hyperglycemia protocol: [place] correction scale. Mobility: bedrest. Antibiotic de-escalation: Simplified to Unasyn. Code Status: partial, ok for intubation. No cpr Family Communication: at bedside  Labs (personally reviewed)  CBC: Recent Labs  Lab 03/04/18 0851  03/05/18 0402 03/06/18 0408 03/07/18 0352 03/08/18 0414 03/09/18 0350  WBC 24.2*   < > 24.6* 20.1* 16.9* 13.1* 12.4*  NEUTROABS 18.2*  --   --  16.2* 14.5* 9.9* 11.3*  HGB 10.4*   < > 9.6* 9.6* 8.6* 8.5* 8.2*  HCT 31.5*   < > 29.0* 28.8* 24.7* 24.2* 24.6*  MCV 95.2   < > 92.1 90.3 88.5 88.3 90.1  PLT 260   < > 220 183 121* 102* 122*   < > = values in this interval not displayed.    Basic Metabolic  Panel: Recent Labs  Lab 03/05/18 0402 03/06/18 0408 03/07/18 0352 03/08/18 0414 03/08/18 1846 03/08/18 2021 03/09/18 0350  NA 136 137 137 143 141 140 143  K 3.7 3.1* 3.5 2.6* 5.8* 5.6* 3.1*  CL 111 115* 117* 117* 116* 113* 115*  CO2 15* 17* 17* 21* 20* 24 24  GLUCOSE 139* 121* 180* 149* 107* 117* 123*  BUN 31* 33* 30* 24* 23 25* 19  CREATININE 1.94* 1.49* 1.25* 0.93 0.75 0.77 0.66  CALCIUM 8.1* 7.9* 8.1* 8.2* 8.0* 8.0* 8.1*  MG 1.5* 1.9 2.0 1.7  --   --  2.0  PHOS 4.9* 3.9 2.9 2.7  --   --  1.7*   GFR: Estimated Creatinine Clearance: 58.1 mL/min (by C-G formula based on SCr of 0.66 mg/dL). Recent Labs  Lab 03/04/18 1238 03/04/18 1742 03/04/18 1746 03/04/18 2251  03/05/18 0523 03/06/18 0408 03/07/18 0352 03/08/18 0414 03/09/18 0350  PROCALCITON  --  24.42  --   --   --   --   --   --   --   --   WBC  --  30.2*  --   --    < >  --  20.1* 16.9* 13.1* 12.4*  LATICACIDVEN 3.03*  --  4.8* 6.7*  --  4.4*  --   --   --   --    < > = values in this interval not displayed.    Liver Function Tests: Recent Labs  Lab 03/04/18 1742 03/06/18 0408 03/07/18 0352 03/08/18 0414 03/09/18 0350  AST 34 29 51* 56* 55*  ALT 28 25 37 47* 50*  ALKPHOS 153* 203* 341* 380* 475*  BILITOT 2.1* 1.7* 1.3* 0.8 0.8  PROT 4.7* 4.9* 4.5* 4.6* 4.4*  ALBUMIN 1.3* 1.6* 1.4* 1.4* 1.3*   Recent Labs  Lab 03/04/18 1742  LIPASE 20  AMYLASE 59   No results for input(s): AMMONIA in the last 168 hours.  ABG    Component Value Date/Time   PHART 7.334 (L) 03/06/2018 0406   PCO2ART 32.8 03/06/2018 0406   PO2ART 157.0 (H) 03/06/2018 0406   HCO3 17.6 (L) 03/06/2018 0406   TCO2 19 (L) 03/06/2018 0406   ACIDBASEDEF 8.0 (H) 03/06/2018 0406   O2SAT 99.0 03/06/2018 0406     Coagulation Profile: Recent Labs  Lab 03/04/18 1742 03/05/18 0944 03/08/18 0414  INR 1.95 2.08 1.45    Cardiac Enzymes: No results for input(s): CKTOTAL, CKMB, CKMBINDEX, TROPONINI in the last 168 hours.  HbA1C: No  results found for: HGBA1C  CBG: Recent Labs  Lab 03/08/18 1504 03/08/18 1906 03/08/18 2301 03/09/18 0352 03/09/18 0715  GLUCAP 126* 109* 129* 111* 132*

## 2018-03-09 NOTE — Procedures (Signed)
ELECTROENCEPHALOGRAM REPORT   Patient: Maria Avila       Room #: 1P37T EEG No. ID: [id number] Age: 67 y.o.        Sex: female Referring Physician: Mannam Report Date:  03/09/2018        Interpreting Physician: Alexis Goodell  History: Trudie E Avila is an 67 y.o. female with right sided weakness and left gaze preference  Medications:  Unasyn, Insulin, Protonix  Conditions of Recording:  This is a 21 channel routine scalp EEG performed with bipolar and monopolar montages arranged in accordance to the international 10/20 system of electrode placement. One channel was dedicated to EKG recording.  The patient is in the poorly responsive state.  Description:  Artifact is prominent during the recording often obscuring the background rhythm. When able to be visualized the background is slow and poorly organized.   It consists of a low voltage, polymorphic delta rhythm that is diffusely distributed and continuous.   No epileptiform activity is noted.  Hyperventilation and intermittent photic stimulation were not performed.   IMPRESSION: This is a technically difficult record due to the prominence of muscle and movement artifact.  It is significant for general background slowing when able to be reviewed.  This finding may be seen with a diffuse disturbance that is etiologically nonspecific, but may include a metabolic encephalopathy, among other possibilities.  No epileptiform activity was noted.     Alexis Goodell, MD Neurology [phone] 03/09/2018, 4:40 PM

## 2018-03-09 NOTE — Progress Notes (Signed)
PHARMACY - ADULT TOTAL PARENTERAL NUTRITION CONSULT NOTE   Pharmacy Consult:  TPN Indication:  Possible esophageal perforation  Patient Measurements: Height: _0  (162.6 cm) Weight: 138 lb 3.7 oz (62.7 kg) IBW/kg (Calculated) : 54.7 TPN AdjBW (KG): 54.7 Body mass index is 23.73 kg/m.  Assessment:  14 YOF with moderate malnutrition presented on 03/04/18 with AMS.  Patient was recently hospitalized for abdominal pain, failure to thrive and inability to tolerate an oral diet.  Now concerned with esophageal perforation, so Pharmacy consulted to manage TPN.  Per documentation, patient has been eating minimally for the past several months and weight decreased from 64.9 to 58.2 kg, now up with fluid resuscitation.  GI: hx colitis/gastritis/FTT.  Baseline prealbumin low at < 5, NG O/P 237m. PPI IV. Endo: DM (no PTA med) - CBGs < 180 (stress steroid d/c) Insulin requirements in the past 24 hours: 6 units SSI Lytes: Pt with refeeding - K 3.1, Phos down to 1.7, Mg 2 Renal: SCr down 0.66 (BL SCr 0.6), BUN 19 - UOP not charted accurately, NS at 75 ml/hr, net +12L Pulm: extubated 11/4. 2L [place]. CT O/P down to 1265mCards: HTN - MAP 70-110s,still tachy, CVP 5 - remains off pressors Hepatobil: AST/ALT slightly elevated, alk phos up to 475, tbili nl, INR down to 1.45, TG WNL ID: Unasyn for Enterococcal and GNR empyema - afebrile, WBC down 12.4 TPN Access: CVC triple lumen placed 03/04/18 TPN start date: 03/06/18  Nutritional Goals (per RD rec on 03/06/18): 1475 kCal, 90-100gm protein, 1.5L fluid per day  Current Nutrition:  TPN   Plan:  Continue TPN at 50 ml/hr (goal 70 ml/hr). Will advance to goal when lytes stable. Hold 20% ILE for first 7 days for ICU patients per ASPEN guidelines (will add 11/9 or when pt transfers out of ICU) TPN will provide 72g AA and 228g CHO for a total of 1063 kCal, meeting ~70% of patient's needs Electrolytes in TPN: continue max acetate. Will increase K and Phos in  TPN. Kphos IV 20 mmol x 1 KCl 1074mIV x 4 Daily multivitamin and trace elements in TPN  Change moderate SSI to q6hr F/U AM labs and Gastrografin study  CarSherlon HandingharmD, BCPS Clinical pharmacist  **Pharmacist phone directory can now be found on [email or web address] ([email or web address]).  Listed under [facility] [place]1/09/2017, 7:36 AM

## 2018-03-10 ENCOUNTER — Inpatient Hospital Stay (HOSPITAL_COMMUNITY): Payer: Medicare Other

## 2018-03-10 DIAGNOSIS — I5030 Unspecified diastolic (congestive) heart failure: Secondary | ICD-10-CM

## 2018-03-10 DIAGNOSIS — I639 Cerebral infarction, unspecified: Secondary | ICD-10-CM

## 2018-03-10 LAB — COMPREHENSIVE METABOLIC PANEL
ALT: 49 U/L — AB (ref 0–44)
AST: 49 U/L — AB (ref 15–41)
Albumin: 1.3 g/dL — ABNORMAL LOW (ref 3.5–5.0)
Alkaline Phosphatase: 408 U/L — ABNORMAL HIGH (ref 38–126)
Anion gap: 3 — ABNORMAL LOW (ref 5–15)
BILIRUBIN TOTAL: 0.7 mg/dL (ref 0.3–1.2)
BUN: 17 mg/dL (ref 8–23)
CALCIUM: 7.8 mg/dL — AB (ref 8.9–10.3)
CO2: 28 mmol/L (ref 22–32)
CREATININE: 0.42 mg/dL — AB (ref 0.44–1.00)
Chloride: 108 mmol/L (ref 98–111)
GFR calc Af Amer: >60 mL/min (ref >60)
Glucose, Bld: 125 mg/dL — ABNORMAL HIGH (ref 70–99)
Potassium: 3.7 mmol/L (ref 3.5–5.1)
Sodium: 139 mmol/L (ref 135–145)
TOTAL PROTEIN: 4.3 g/dL — AB (ref 6.5–8.1)

## 2018-03-10 LAB — HEMOGLOBIN A1C
HEMOGLOBIN A1C: 5.1 % (ref 4.8–5.6)
Mean Plasma Glucose: 99.67 mg/dL

## 2018-03-10 LAB — ECHOCARDIOGRAM COMPLETE
Height: 64 in
WEIGHTICAEL: 2271.62 [oz_av]

## 2018-03-10 LAB — CBC WITH DIFFERENTIAL/PLATELET
BASOS ABS: 0 10*3/uL (ref 0.0–0.1)
BASOS PCT: 0 %
EOS ABS: 0 10*3/uL (ref 0.0–0.5)
EOS PCT: 0 %
HEMATOCRIT: 26 % — AB (ref 36.0–46.0)
Hemoglobin: 8.4 g/dL — ABNORMAL LOW (ref 12.0–15.0)
Lymphocytes Relative: 12 %
Lymphs Abs: 1.7 10*3/uL (ref 0.7–4.0)
MCH: 29.4 pg (ref 26.0–34.0)
MCHC: 32.3 g/dL (ref 30.0–36.0)
MCV: 90.9 fL (ref 80.0–100.0)
MONO ABS: 0.6 10*3/uL (ref 0.1–1.0)
Monocytes Relative: 4 %
Neutro Abs: 11.9 10*3/uL — ABNORMAL HIGH (ref 1.7–7.7)
Neutrophils Relative %: 84 %
PLATELETS: 135 10*3/uL — AB (ref 150–400)
RBC: 2.86 MIL/uL — AB (ref 3.87–5.11)
RDW: 15.6 % — AB (ref 11.5–15.5)
WBC: 14.2 10*3/uL — AB (ref 4.0–10.5)
nRBC: 0 /100 WBC
nRBC: 0.1 % (ref 0.0–0.2)

## 2018-03-10 LAB — LIPID PANEL
Cholesterol: 93 mg/dL (ref 0–200)
HDL: 15 mg/dL — ABNORMAL LOW (ref >40)
LDL CALC: 62 mg/dL (ref 0–99)
TRIGLYCERIDES: 81 mg/dL (ref <150)
Total CHOL/HDL Ratio: 6.2 RATIO
VLDL: 16 mg/dL (ref 0–40)

## 2018-03-10 LAB — GLUCOSE, CAPILLARY
GLUCOSE-CAPILLARY: 132 mg/dL — AB (ref 70–99)
GLUCOSE-CAPILLARY: 126 mg/dL — AB (ref 70–99)
Glucose-Capillary: 102 mg/dL — ABNORMAL HIGH (ref 70–99)

## 2018-03-10 LAB — PHOSPHORUS: PHOSPHORUS: 2.7 mg/dL (ref 2.5–4.6)

## 2018-03-10 LAB — HEPARIN LEVEL (UNFRACTIONATED): HEPARIN UNFRACTIONATED: 0.58 [IU]/mL (ref 0.30–0.70)

## 2018-03-10 MED ORDER — SODIUM CHLORIDE 0.9% FLUSH
10.0000 mL | INTRAVENOUS | Status: DC | PRN
  Administered 2018-03-10 – 2018-03-11 (×3): 10 mL
  Filled 2018-03-10 (×2): qty 40

## 2018-03-10 MED ORDER — TRAVASOL 10 % IV SOLN
INTRAVENOUS | Status: AC
  Administered 2018-03-10: 17:00:00 via INTRAVENOUS
  Filled 2018-03-10: qty 1008

## 2018-03-10 MED ORDER — GERHARDT'S BUTT CREAM
TOPICAL_CREAM | Freq: Every day | CUTANEOUS | Status: DC
  Administered 2018-03-10 – 2018-03-14 (×5): via TOPICAL
  Filled 2018-03-10: qty 1

## 2018-03-10 MED ORDER — SODIUM CHLORIDE 0.9 % IV BOLUS
500.0000 mL | Freq: Once | INTRAVENOUS | Status: AC
  Administered 2018-03-10: 500 mL via INTRAVENOUS

## 2018-03-10 MED ORDER — SODIUM CHLORIDE 0.9% FLUSH
10.0000 mL | Freq: Two times a day (BID) | INTRAVENOUS | Status: DC
  Administered 2018-03-10: 20 mL
  Administered 2018-03-11: 10 mL
  Administered 2018-03-12: 20 mL
  Administered 2018-03-12 – 2018-03-17 (×5): 10 mL

## 2018-03-10 MED ORDER — HEPARIN (PORCINE) 25000 UT/250ML-% IV SOLN
800.0000 [IU]/h | INTRAVENOUS | Status: DC
  Administered 2018-03-10 – 2018-03-13 (×2): 800 [IU]/h via INTRAVENOUS
  Filled 2018-03-10 (×3): qty 250

## 2018-03-10 MED ORDER — CHLORHEXIDINE GLUCONATE CLOTH 2 % EX PADS
6.0000 | MEDICATED_PAD | Freq: Every day | CUTANEOUS | Status: DC
  Administered 2018-03-10 – 2018-03-17 (×5): 6 via TOPICAL

## 2018-03-10 NOTE — Progress Notes (Signed)
  Echocardiogram 2D Echocardiogram has been performed.  Maria Avila 03/10/2018, 3:34 PM

## 2018-03-10 NOTE — Progress Notes (Signed)
PHARMACY - ADULT TOTAL PARENTERAL NUTRITION CONSULT NOTE   Pharmacy Consult:  TPN Indication:  Possible esophageal perforation  Patient Measurements: Height: 5' 4"  (162.6 cm) Weight: 141 lb 15.6 oz (64.4 kg) IBW/kg (Calculated) : 54.7 TPN AdjBW (KG): 54.7 Body mass index is 24.37 kg/m.  Assessment:  3 YOF with moderate malnutrition presented on 03/04/18 with AMS. Patient was recently hospitalized for abdominal pain, failure to thrive and inability to tolerate an oral diet. Now concerned with esophageal perforation, so Pharmacy consulted to manage TPN.  Per documentation, patient has been eating minimally for the past several months and weight decreased from 64.9 to 58.2 kg, now up with fluid resuscitation.  11/5 - to transfer out of ICU but now with acute/subacute stroke, remains in ICU  GI: hx colitis/gastritis/FTT.  Prealbumin remains low at < 5, NG O/P 327m. PPI IV. Endo: DM (no PTA med) - CBGs < 180 (stress steroids d/c'd) Insulin requirements in the past 24 hours: 2 units SSI Lytes: Pt with refeeding - K up to 3.7 (s/p K runs x 4), Phos up to 2.7 (KPhos 229ml x 1), Mg 2 Renal: SCr down to 0.42 (BL SCr 0.6), BUN 17 - UOP not charted accurately, NS at 75 ml/hr, net +12.4L Neuro: New acute/subacute stroke 11/5 - started on asa supp, statin when able Pulm: extubated 11/4. 2L [place]. CT O/P down to 3064mards: HTN hx - MAP 100s, still tachy, CVP 5 - remains off pressors. Permissive HTN s/p stroke 11/5 Hepatobil: AST/ALT slightly elevated, alk phos down to 408, tbili nl, INR down to 1.45, TG WNL ID: Unasyn for Enterococcal and Ecoli/pseudomonas empyema - afebrile, WBC down to 12.4 TPN Access: CVC triple lumen placed 03/04/18 TPN start date: 03/06/18  Nutritional Goals (*per updated RD rec on 03/09/18*): 1700-1900 kCal, 95-110gm protein, >/=1.7L fluid per day  Current Nutrition:  TPN   Plan:  Advance TPN to 70 ml/hr. Monitor for refeeding closely. Hold 20% ILE for first 7 days  for ICU patients per ASPEN guidelines (will add 11/9 or when pt transfers out of ICU) TPN will provide 101g AA and 336g CHO for a total of 1546 kCal, meeting 100% of protein and 91% patient's kCal needs Electrolytes in TPN: change Cl:Ac to 1:2. Continue increased Phos in TPN; reduce K to 80 meq/L with TPN rate increase Daily multivitamin and trace elements in TPN  Continue moderate SSI at q6hr and monitor CBGs F/U AM labs and Gastrografin study as able  HalElicia LampharmD, BCPS Clinical Pharmacist Clinical phone [phone] check AMION for all [facility] [place] [facility] 03/10/2018 8:02 AM

## 2018-03-10 NOTE — Progress Notes (Signed)
SLP Bedside Swallow Evaluation: Pt undergoing recent w/u for FTT now presents s/p prolonged intubation and multiple acute infarcts. Although there is concern for possible esophageal perforation, MD request SLP swallow evaluation be completed prior to gastrograffin exam as he reports concern for aspiration is greater than risk of leak at this time. Pt has inconsistent responses and response times when asked a question or given a command. Mod cues are needed for oral acceptance; bilateral anterior loss is observed, as is oral holding with need for oral suction. She does swallow some small amounts of ice and water, with multiple swallows per bolus and seemingly limited hyolaryngeal movement to palpation. Suspect that from a cognitiev and oral standpoint that completing the gastrograffin study would be challenging at this time, with moderate aspiration risk and likelihood that she would not be able to fully participate in testing. SLP will f/u for additional PO trials (limited to small amounts of water/ice pending completion of esophageal study) and completion of cognitive-lingusitic evaluation.     03/10/18 0843  SLP Visit Information  SLP Received On 03/10/18  Subjective  Subjective pt alert, inconsistently responding and with inconsistent response times  Patient/Family Stated Goal family wants her to get better  General Information  HPI Pt is a 67 year old admitted with respiratory failure, left lung empyema s/p CT placement 10/31, and septic shock. ETT 10/31-11/4. MRI 11/6 showed multifocal, bilateral acute ischemia with largest infarction in the L parietal lobe. Additional areas include the L frontal lobe, R parietal lobe, and pons. PMH: malnutrition and unexplained FTT (s/p recent GI and psych consults), HTN, DM  Type of Study Bedside Swallow Evaluation  Previous Swallow Assessment none in chart  Diet Prior to this Study NPO  Temperature Spikes Noted No  Respiratory Status Nasal cannula  History  of Recent Intubation Yes  Length of Intubations (days) 5 days  Date extubated 03/08/18  Behavior/Cognition Alert;Distractible;Requires cueing  Oral Cavity Assessment WFL  Oral Cavity - Dentition Adequate natural dentition  Self-Feeding Abilities Total assist  Patient Positioning Upright in bed  Baseline Vocal Quality Low vocal intensity  Volitional Cough Cognitively unable to elicit  Volitional Swallow Unable to elicit  Pain Assessment  Pain Assessment Faces  Faces Pain Scale 0  Oral Assessment (Complete on admission/transfer/change in patient condition)  Does patient have any of the following "high(er) risk" factors? Nutritional status - fluids only or NPO for >24 hours  Patient is HIGH RISK: Non-ventilated Order set for Adult Oral Care Protocol initiated - "High Risk Patients - Non-Ventilated" option selected  (see row information)  Oral Motor/Sensory Function  Overall Oral Motor/Sensory Function  (UTA)  Ice Chips  Ice chips Impaired  Presentation Spoon  Oral Phase Impairments Poor awareness of bolus  Pharyngeal Phase Impairments Multiple swallows;Decreased hyoid-laryngeal movement  Thin Liquid  Thin Liquid Impaired  Presentation Cup;Spoon;Straw  Oral Phase Impairments Poor awareness of bolus;Reduced labial seal  Oral Phase Functional Implications Left anterior spillage;Right anterior spillage;Oral holding  Pharyngeal  Phase Impairments Decreased hyoid-laryngeal movement;Multiple swallows  Nectar Thick Liquid  Nectar Thick Liquid NT  Honey Thick Liquid  Honey Thick Liquid NT  Puree  Puree NT  Solid  Solid NT  SLP - End of Session  Patient left in bed;with family/visitor present  Nurse Communication Diet recommendation;Treatment plan  SLP Assessment  Clinical Impression Statement (ACUTE ONLY) Pt undergoing recent w/u for FTT now presents s/p prolonged intubation and multiple acute infarcts. Although there is concern for possible esophageal perforation, MD request SLP  swallow evaluation  be completed prior to gastrograffin exam as he reports concern for aspiration is greater than risk of leak at this time. Pt has inconsistent responses and response times when asked a question or given a command. Mod cues are needed for oral acceptance; bilateral anterior loss is observed, as is oral holding with need for oral suction. She does swallow some small amounts of ice and water, with multiple swallows per bolus and seemingly limited hyolaryngeal movement to palpation. Suspect that from a cognitiev and oral standpoint that completing the gastrograffin study would be challenging at this time, with moderate aspiration risk and likelihood that she would not be able to fully participate in testing. SLP will f/u for additional PO trials (limited to small amounts of water/ice pending completion of esophageal study) and completion of cognitive-lingusitic evaluation.  SLP Visit Diagnosis Dysphagia, unspecified (R13.10)  Impact on safety and function Moderate aspiration risk  Other Related Risk Factors Cognitive impairment;Prolonged intubation;Deconditioning  Swallow Evaluation Recommendations  SLP Diet Recommendations NPO  Medication Administration Via alternative means  Treatment Plan  Oral Care Recommendations Oral care QID  Other Recommendations Have oral suction available  Treatment Recommendations Therapy as outlined in treatment plan below  Follow up Recommendations  (tba)  Speech Therapy Frequency (ACUTE ONLY) min 2x/week  Treatment Duration 2 weeks  Interventions Aspiration precaution training;Patient/family education;Trials of upgraded texture/liquids  Prognosis  Prognosis for Safe Diet Advancement Good  Barriers to Reach Goals Cognitive deficits  Individuals Consulted  Consulted and Agree with Results and Recommendations Patient;Family member/caregiver;MD;RN  Family Member Consulted husband, sister  SLP Time Calculation  SLP Start Time (ACUTE ONLY) 1137  SLP Stop  Time (ACUTE ONLY) 1150  SLP Time Calculation (min) (ACUTE ONLY) 13 min  SLP Evaluations  $ SLP Speech Visit 1 Visit  SLP Evaluations  $BSS Swallow 1 Procedure

## 2018-03-10 NOTE — Progress Notes (Signed)
Report called to 4E. Report given to [place], Therapist, sports. Husband is at bedside. Kersti Scavone, RN with [employer] transporting patient to bed 13. All creams packed up with patient.

## 2018-03-10 NOTE — Progress Notes (Addendum)
ANTICOAGULATION CONSULT NOTE - Initial Consult  Pharmacy Consult for heparin Indication: DVT  Allergies  Allergen Reactions  . Crestor [Rosuvastatin Calcium] Swelling and Other (See Comments)    Swelling of legs and muscle weakness  . Zocor [Simvastatin] Swelling and Other (See Comments)    Swelling of legs and muscle weakness    Patient Measurements: Height: 5\' 4"  (162.6 cm) Weight: 141 lb 15.6 oz (64.4 kg) IBW/kg (Calculated) : 54.7 HEPARIN DW (KG): 54.7  Vital Signs: Temp: 97.7 F (36.5 C) (11/06 1100) Temp Source: Oral (11/06 1100) BP: 85/67 (11/06 1300) Pulse Rate: 94 (11/06 1300)  Labs: Recent Labs    03/08/18 0414  03/08/18 2021 03/09/18 0350 03/10/18 0451  HGB 8.5*  --   --  8.2* 8.4*  HCT 24.2*  --   --  24.6* 26.0*  PLT 102*  --   --  122* 135*  LABPROT 17.5*  --   --   --   --   INR 1.45  --   --   --   --   CREATININE 0.93   < > 0.77 0.66 0.42*   < > = values in this interval not displayed.    Estimated Creatinine Clearance: 58.1 mL/min (A) (by C-G formula based on SCr of 0.42 mg/dL (L)).   Medical History: Past Medical History:  Diagnosis Date  . Colitis   . Diabetes mellitus without complication ([place])   . Hypertension   . Malnutrition ([place])     Assessment: Maria Avila is a 41y old female presented with altered mental status from SNF, found to have empyema. Pharmacy now consulted to dose heparin in setting of DVT. 11/6 MRI: Multifocal acute ischemia likely from embolic disease. Affected sites are left parietal lobe, right parietal love, and posterior left frontal lobe. Will be conservative in dosing. No bolus.  Goal of Therapy:  Heparin level 0.3-0.5 Monitor platelets by anticoagulation protocol: Yes   Plan:  Start heparin infusion at 800 units/hr Heparin level in 6 hours Monitor daily heparin level, CBC, s/sx of bleeding  Thank you for involving pharmacy in this patient's care.  Janae Bridgeman, PharmD PGY1 Pharmacy Resident Phone: [phone] 03/10/2018 2:13 PM

## 2018-03-10 NOTE — Progress Notes (Signed)
STROKE TEAM PROGRESS NOTE   SUBJECTIVE (INTERVAL HISTORY) Her husband is at the bedside.  Overall her condition is unchanged. Pt was very lethargic and no spontaneous movement, lying in bed. However, able to answer limited questions but perseverated on answers. Not cooperative on exam. LE venous doppler showed DVT bilaterally. Will need anticoagulation in the setting of embolic stroke.   OBJECTIVE Temp:  [97.5 F (36.4 C)-97.7 F (36.5 C)] 97.7 F (36.5 C) (11/06 1100) Pulse Rate:  [81-113] 94 (11/06 1300) Cardiac Rhythm: Sinus tachycardia (11/06 1000) Resp:  [12-24] 17 (11/06 1300) BP: (85-139)/(67-102) 85/67 (11/06 1300) SpO2:  [99 %-100 %] 100 % (11/06 1300) Weight:  [64.4 kg] 64.4 kg (11/06 0500)  Recent Labs  Lab 03/09/18 1140 03/09/18 1532 03/09/18 2001 03/09/18 2312 03/10/18 0626  GLUCAP 120* 142* 91 120* 102*   Recent Labs  Lab 03/05/18 0402 03/06/18 0408 03/07/18 0352 03/08/18 0414 03/08/18 1846 03/08/18 2021 03/09/18 0350 03/10/18 0451  NA 136 137 137 143 141 140 143 139  K 3.7 3.1* 3.5 2.6* 5.8* 5.6* 3.1* 3.7  CL 111 115* 117* 117* 116* 113* 115* 108  CO2 15* 17* 17* 21* 20* 24 24 28   GLUCOSE 139* 121* 180* 149* 107* 117* 123* 125*  BUN 31* 33* 30* 24* 23 25* 19 17  CREATININE 1.94* 1.49* 1.25* 0.93 0.75 0.77 0.66 0.42*  CALCIUM 8.1* 7.9* 8.1* 8.2* 8.0* 8.0* 8.1* 7.8*  MG 1.5* 1.9 2.0 1.7  --   --  2.0  --   PHOS 4.9* 3.9 2.9 2.7  --   --  1.7* 2.7   Recent Labs  Lab 03/06/18 0408 03/07/18 0352 03/08/18 0414 03/09/18 0350 03/10/18 0451  AST 29 51* 56* 55* 49*  ALT 25 37 47* 50* 49*  ALKPHOS 203* 341* 380* 475* 408*  BILITOT 1.7* 1.3* 0.8 0.8 0.7  PROT 4.9* 4.5* 4.6* 4.4* 4.3*  ALBUMIN 1.6* 1.4* 1.4* 1.3* 1.3*   Recent Labs  Lab 03/06/18 0408 03/07/18 0352 03/08/18 0414 03/09/18 0350 03/10/18 0451  WBC 20.1* 16.9* 13.1* 12.4* 14.2*  NEUTROABS 16.2* 14.5* 9.9* 11.3* 11.9*  HGB 9.6* 8.6* 8.5* 8.2* 8.4*  HCT 28.8* 24.7* 24.2* 24.6* 26.0*   MCV 90.3 88.5 88.3 90.1 90.9  PLT 183 121* 102* 122* 135*   No results for input(s): CKTOTAL, CKMB, CKMBINDEX, TROPONINI in the last 168 hours. Recent Labs    03/08/18 0414  LABPROT 17.5*  INR 1.45   No results for input(s): COLORURINE, LABSPEC, PHURINE, GLUCOSEU, HGBUR, BILIRUBINUR, KETONESUR, PROTEINUR, UROBILINOGEN, NITRITE, LEUKOCYTESUR in the last 72 hours.  Invalid input(s): APPERANCEUR     Component Value Date/Time   CHOL 93 03/10/2018 0451   TRIG 81 03/10/2018 0451   HDL 15 (L) 03/10/2018 0451   CHOLHDL 6.2 03/10/2018 0451   VLDL 16 03/10/2018 0451   LDLCALC 62 03/10/2018 0451   Lab Results  Component Value Date   HGBA1C 5.1 03/10/2018   No results found for: LABOPIA, COCAINSCRNUR, LABBENZ, AMPHETMU, THCU, LABBARB  No results for input(s): ETH in the last 168 hours.  I have personally reviewed the radiological images below and agree with the radiology interpretations.  Ct Angio Head W Or Wo Contrast  Result Date: 03/09/2018 CLINICAL DATA:  67 y/o F; patient extubated yesterday. Right-sided weakness and slow speech. Increased lethargy. EXAM: CT ANGIOGRAPHY HEAD AND NECK CT PERFUSION BRAIN TECHNIQUE: Multidetector CT imaging of the head and neck was performed using the standard protocol during bolus administration of intravenous contrast. Multiplanar CT  image reconstructions and MIPs were obtained to evaluate the vascular anatomy. Carotid stenosis measurements (when applicable) are obtained utilizing NASCET criteria, using the distal internal carotid diameter as the denominator. Multiphase CT imaging of the brain was performed following IV bolus contrast injection. Subsequent parametric perfusion maps were calculated using RAPID software. CONTRAST:  90 cc Isovue 370 COMPARISON:  03/09/2018, 03/04/2018, 02/16/2018 CT head. 02/16/2018 MRI head. FINDINGS: CTA NECK FINDINGS Aortic arch: Standard branching. Imaged portion shows no evidence of aneurysm or dissection. No significant  stenosis of the major arch vessel origins. Mild calcific aortic atherosclerosis. Right carotid system: No evidence of dissection, stenosis (50% or greater) or occlusion. Mild non stenotic calcific atherosclerosis of the carotid bifurcation. Left carotid system: No evidence of dissection, stenosis (50% or greater) or occlusion. Minimal non stenotic calcific atherosclerosis of the carotid bifurcation. Vertebral arteries: Left dominant, diminutive right vertebral artery. No evidence of dissection, stenosis (50% or greater) or occlusion. Skeleton: Moderate spondylosis of the cervical spine with multilevel disc and facet degenerative changes. No high-grade bony canal stenosis. Other neck: Negative. Upper chest: Negative. Review of the MIP images confirms the above findings CTA HEAD FINDINGS Anterior circulation: No significant stenosis, proximal occlusion, aneurysm, or vascular malformation. Mild non stenotic calcific atherosclerosis of the carotid siphons. Posterior circulation: No significant stenosis, proximal occlusion, aneurysm, or vascular malformation. Venous sinuses: As permitted by contrast timing, patent. Anatomic variants: Right vertebral artery largely terminates in the right PICA with diminutive contribution to the basilar. Complete circle-of-Willis. Large left A1, large anterior communicating artery, diminutive right A1, normal variant Review of the MIP images confirms the above findings CT Brain Perfusion Findings: CBF (<30%) Volume: 77mL Perfusion (Tmax>6.0s) volume: 28mL Mismatch Volume: 32mL Infarction Location:Left parietal lobe subacute infarction visible on noncontrast CT, pseudonormalized perfusion. IMPRESSION: 1. Patent carotid and vertebral arteries. No dissection, aneurysm, or hemodynamically significant stenosis utilizing NASCET criteria. 2. Patent anterior and posterior intracranial circulation. No large vessel occlusion, aneurysm, or significant stenosis. 3. Left parietal lobe subacute infarction  visible on noncontrast CT with pseudonormalized perfusion. 4. No perfusion anomaly to suggest interval acute infarct. Electronically Signed   By: Kristine Garbe M.D.   On: 03/09/2018 18:44   Ct Head Wo Contrast  Result Date: 03/09/2018 CLINICAL DATA:  Residual right-sided weakness. EXAM: CT HEAD WITHOUT CONTRAST TECHNIQUE: Contiguous axial images were obtained from the base of the skull through the vertex without intravenous contrast. COMPARISON:  03/04/2018 CT.  02/16/2018 MRI. FINDINGS: Brain: There is acute to subacute infarction in the left parietal lobe consistent with left MCA branch vessel infarction. The brain shows low-density with mild swelling. No hemorrhage. No other abnormal brain finding. No mass, hydrocephalus or extra-axial collection. Vascular: There is atherosclerotic calcification of the major vessels at the base of the brain. Skull: Negative Sinuses/Orbits: Clear/normal Other: None IMPRESSION: Acute/subacute infarction in the left parietal lobe consistent with MCA branch vessel infarction. No evidence of hemorrhage or mass effect. Electronically Signed   By: Nelson Chimes M.D.   On: 03/09/2018 13:31   Ct Head Wo Contrast  Result Date: 03/04/2018 CLINICAL DATA:  Altered level of consciousness EXAM: CT HEAD WITHOUT CONTRAST TECHNIQUE: Contiguous axial images were obtained from the base of the skull through the vertex without intravenous contrast. COMPARISON:  02/16/2018 FINDINGS: Brain: No evidence of acute infarction, hemorrhage, hydrocephalus, extra-axial collection or mass lesion/mass effect. Vascular: No hyperdense vessel or unexpected calcification. Skull: Normal. Negative for fracture or focal lesion. Sinuses/Orbits: No acute finding. Other: None. IMPRESSION: No acute abnormality noted.  No change from the prior exam. Electronically Signed   By: Inez Catalina M.D.   On: 03/04/2018 09:33   Ct Head Wo Contrast  Result Date: 02/16/2018 CLINICAL DATA:  Generalize weakness  and slurred speech for 2 weeks. EXAM: CT HEAD WITHOUT CONTRAST TECHNIQUE: Contiguous axial images were obtained from the base of the skull through the vertex without intravenous contrast. COMPARISON:  04/01/2005 FINDINGS: Brain: No evidence of acute infarction, hemorrhage, hydrocephalus, extra-axial collection or mass lesion/mass effect. Vascular: Mild intracranial arterial vascular calcifications. Skull: Calvarium appears intact. Sinuses/Orbits: Paranasal sinuses and mastoid air cells are clear. Other: None. IMPRESSION: No acute intracranial abnormality. Electronically Signed   By: Lucienne Capers M.D.   On: 02/16/2018 06:24   Ct Angio Neck W Or Wo Contrast  Result Date: 03/09/2018 CLINICAL DATA:  67 y/o F; patient extubated yesterday. Right-sided weakness and slow speech. Increased lethargy. EXAM: CT ANGIOGRAPHY HEAD AND NECK CT PERFUSION BRAIN TECHNIQUE: Multidetector CT imaging of the head and neck was performed using the standard protocol during bolus administration of intravenous contrast. Multiplanar CT image reconstructions and MIPs were obtained to evaluate the vascular anatomy. Carotid stenosis measurements (when applicable) are obtained utilizing NASCET criteria, using the distal internal carotid diameter as the denominator. Multiphase CT imaging of the brain was performed following IV bolus contrast injection. Subsequent parametric perfusion maps were calculated using RAPID software. CONTRAST:  90 cc Isovue 370 COMPARISON:  03/09/2018, 03/04/2018, 02/16/2018 CT head. 02/16/2018 MRI head. FINDINGS: CTA NECK FINDINGS Aortic arch: Standard branching. Imaged portion shows no evidence of aneurysm or dissection. No significant stenosis of the major arch vessel origins. Mild calcific aortic atherosclerosis. Right carotid system: No evidence of dissection, stenosis (50% or greater) or occlusion. Mild non stenotic calcific atherosclerosis of the carotid bifurcation. Left carotid system: No evidence of  dissection, stenosis (50% or greater) or occlusion. Minimal non stenotic calcific atherosclerosis of the carotid bifurcation. Vertebral arteries: Left dominant, diminutive right vertebral artery. No evidence of dissection, stenosis (50% or greater) or occlusion. Skeleton: Moderate spondylosis of the cervical spine with multilevel disc and facet degenerative changes. No high-grade bony canal stenosis. Other neck: Negative. Upper chest: Negative. Review of the MIP images confirms the above findings CTA HEAD FINDINGS Anterior circulation: No significant stenosis, proximal occlusion, aneurysm, or vascular malformation. Mild non stenotic calcific atherosclerosis of the carotid siphons. Posterior circulation: No significant stenosis, proximal occlusion, aneurysm, or vascular malformation. Venous sinuses: As permitted by contrast timing, patent. Anatomic variants: Right vertebral artery largely terminates in the right PICA with diminutive contribution to the basilar. Complete circle-of-Willis. Large left A1, large anterior communicating artery, diminutive right A1, normal variant Review of the MIP images confirms the above findings CT Brain Perfusion Findings: CBF (<30%) Volume: 39mL Perfusion (Tmax>6.0s) volume: 29mL Mismatch Volume: 66mL Infarction Location:Left parietal lobe subacute infarction visible on noncontrast CT, pseudonormalized perfusion. IMPRESSION: 1. Patent carotid and vertebral arteries. No dissection, aneurysm, or hemodynamically significant stenosis utilizing NASCET criteria. 2. Patent anterior and posterior intracranial circulation. No large vessel occlusion, aneurysm, or significant stenosis. 3. Left parietal lobe subacute infarction visible on noncontrast CT with pseudonormalized perfusion. 4. No perfusion anomaly to suggest interval acute infarct. Electronically Signed   By: Kristine Garbe M.D.   On: 03/09/2018 18:44   Ct Chest Wo Contrast  Result Date: 03/04/2018 CLINICAL DATA:   Shortness of breath, pneumonia EXAM: CT CHEST WITHOUT CONTRAST TECHNIQUE: Multidetector CT imaging of the chest was performed following the standard protocol without IV contrast. COMPARISON:  Chest x-ray 03/04/2018  FINDINGS: Cardiovascular: Heart is normal size. Aorta is normal caliber with scattered aortic calcifications. Moderate coronary artery calcifications in the left anterior descending coronary artery. Mediastinum/Nodes: Reactive borderline sized mediastinal lymph nodes. Lungs/Pleura: Complex large left pleural effusions with loculation and pleural gas. Cannot exclude bronchopleural fistula or empyema. Collapse of much of the left lung with only a small amount of aerated left upper lobe. Nodular airspace opacities in the right upper lobe and superior segment of the right lower lobe with trace right pleural effusion. Upper Abdomen: Imaging into the upper abdomen shows no acute findings. Musculoskeletal: Chest wall soft tissues are unremarkable. Degenerative changes in the thoracic spine. No acute bony abnormality. IMPRESSION: Large complex left pleural effusion with pleural gas and extensive loculations. This could reflect bronchopleural fistula or empyema. Collapse of much of the left lung with only a small amount of aerated left upper lobe. Nodular ground-glass airspace opacities centrally in the right upper lobe and in the superior segment of the right lower lobe, likely infectious/inflammatory. Coronary artery disease, aortic atherosclerosis. Electronically Signed   By: Rolm Baptise M.D.   On: 03/04/2018 11:39   Mr Brain Wo Contrast  Result Date: 03/10/2018 CLINICAL DATA:  Stroke follow-up EXAM: MRI HEAD WITHOUT CONTRAST TECHNIQUE: Multiplanar, multiecho pulse sequences of the brain and surrounding structures were obtained without intravenous contrast. COMPARISON:  Brain MRI 02/16/2018 FINDINGS: BRAIN: There are multiple foci of abnormal diffusion restriction, the largest of which is located in the  left parietal lobe. Other foci are located in the posterior left frontal lobe, right parietal lobe and the pons. There is no midline shift or mass effect. Mild cytotoxic edema of the left parietal lobe. Minimal white matter hyperintensity for age. Brain parenchyma is otherwise normal. The cerebral and cerebellar volume are age-appropriate. Susceptibility-sensitive sequences show no chronic microhemorrhage or superficial siderosis. VASCULAR: Major intracranial arterial and venous sinus flow voids are normal. SKULL AND UPPER CERVICAL SPINE: Calvarial bone marrow signal is normal. There is no skull base mass. Visualized upper cervical spine and soft tissues are normal. SINUSES/ORBITS: No fluid levels or advanced mucosal thickening. No mastoid or middle ear effusion. The orbits are normal. IMPRESSION: 1. Multifocal acute ischemia, spread throughout multiple bilateral vascular territories. 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 2. The largest infarction is in the left parietal lobe. Other sites include the posterior left frontal lobe, right parietal lobe and pons. 3. No hemorrhage or mass effect. Electronically Signed   By: Ulyses Jarred M.D.   On: 03/10/2018 06:17   Mr Brain Wo Contrast  Result Date: 02/16/2018 CLINICAL DATA:  Weight loss and weakness. EXAM: MRI HEAD WITHOUT CONTRAST TECHNIQUE: Multiplanar, multiecho pulse sequences of the brain and surrounding structures were obtained without intravenous contrast. COMPARISON:  CT same day FINDINGS: Brain: Generalized atrophy. Diffusion imaging does not show any acute or subacute infarction. The brainstem and cerebellum are normal. Mild chronic small-vessel ischemic change of the cerebral hemispheric deep white matter. No cortical or large vessel territory infarction. No mass lesion, hemorrhage, hydrocephalus or extra-axial collection. Vascular: Major vessels at the base of the brain show flow. Skull and upper cervical spine: Negative Sinuses/Orbits:  Clear/normal Other: None IMPRESSION: No acute or reversible finding. Generalized atrophy. Mild chronic small-vessel ischemic change of the cerebral hemispheric white matter. Electronically Signed   By: Nelson Chimes M.D.   On: 02/16/2018 21:52   Nm Hepato W/eject Fract  Result Date: 02/18/2018 CLINICAL DATA:  Weight loss. EXAM: NUCLEAR MEDICINE HEPATOBILIARY IMAGING WITH GALLBLADDER EF TECHNIQUE:  Sequential images of the abdomen were obtained out to 60 minutes following intravenous administration of radiopharmaceutical. After oral ingestion of Ensure, gallbladder ejection fraction was determined. At 60 min, normal ejection fraction is greater than 33%. RADIOPHARMACEUTICALS:  5.3 mCi Tc-60m  Choletec IV COMPARISON:  None. FINDINGS: Prompt uptake and biliary excretion of activity by the liver is seen. Gallbladder activity is visualized, consistent with patency of cystic duct. Biliary activity passes into small bowel, consistent with patent common bile duct. The gallbladder emptied spontaneously before the Ensure. As a result, an ejection fraction cannot be accurately calculated. That being said, the gallbladder is not visualized after ensure and gallbladder emptying is near-complete. IMPRESSION: The gallbladder emptied spontaneously before consumption of Ensure. While an ejection fraction cannot be calculated, gallbladder emptying is near complete. Electronically Signed   By: Dorise Bullion III M.D   On: 02/18/2018 13:40   Ct Cerebral Perfusion W Contrast  Result Date: 03/09/2018 CLINICAL DATA:  67 y/o F; patient extubated yesterday. Right-sided weakness and slow speech. Increased lethargy. EXAM: CT ANGIOGRAPHY HEAD AND NECK CT PERFUSION BRAIN TECHNIQUE: Multidetector CT imaging of the head and neck was performed using the standard protocol during bolus administration of intravenous contrast. Multiplanar CT image reconstructions and MIPs were obtained to evaluate the vascular anatomy. Carotid stenosis  measurements (when applicable) are obtained utilizing NASCET criteria, using the distal internal carotid diameter as the denominator. Multiphase CT imaging of the brain was performed following IV bolus contrast injection. Subsequent parametric perfusion maps were calculated using RAPID software. CONTRAST:  90 cc Isovue 370 COMPARISON:  03/09/2018, 03/04/2018, 02/16/2018 CT head. 02/16/2018 MRI head. FINDINGS: CTA NECK FINDINGS Aortic arch: Standard branching. Imaged portion shows no evidence of aneurysm or dissection. No significant stenosis of the major arch vessel origins. Mild calcific aortic atherosclerosis. Right carotid system: No evidence of dissection, stenosis (50% or greater) or occlusion. Mild non stenotic calcific atherosclerosis of the carotid bifurcation. Left carotid system: No evidence of dissection, stenosis (50% or greater) or occlusion. Minimal non stenotic calcific atherosclerosis of the carotid bifurcation. Vertebral arteries: Left dominant, diminutive right vertebral artery. No evidence of dissection, stenosis (50% or greater) or occlusion. Skeleton: Moderate spondylosis of the cervical spine with multilevel disc and facet degenerative changes. No high-grade bony canal stenosis. Other neck: Negative. Upper chest: Negative. Review of the MIP images confirms the above findings CTA HEAD FINDINGS Anterior circulation: No significant stenosis, proximal occlusion, aneurysm, or vascular malformation. Mild non stenotic calcific atherosclerosis of the carotid siphons. Posterior circulation: No significant stenosis, proximal occlusion, aneurysm, or vascular malformation. Venous sinuses: As permitted by contrast timing, patent. Anatomic variants: Right vertebral artery largely terminates in the right PICA with diminutive contribution to the basilar. Complete circle-of-Willis. Large left A1, large anterior communicating artery, diminutive right A1, normal variant Review of the MIP images confirms the above  findings CT Brain Perfusion Findings: CBF (<30%) Volume: 58mL Perfusion (Tmax>6.0s) volume: 45mL Mismatch Volume: 7mL Infarction Location:Left parietal lobe subacute infarction visible on noncontrast CT, pseudonormalized perfusion. IMPRESSION: 1. Patent carotid and vertebral arteries. No dissection, aneurysm, or hemodynamically significant stenosis utilizing NASCET criteria. 2. Patent anterior and posterior intracranial circulation. No large vessel occlusion, aneurysm, or significant stenosis. 3. Left parietal lobe subacute infarction visible on noncontrast CT with pseudonormalized perfusion. 4. No perfusion anomaly to suggest interval acute infarct. Electronically Signed   By: Kristine Garbe M.D.   On: 03/09/2018 18:44   LE venous doppler  Right: Findings consistent with acute deep vein thrombosis involving the right posterior  tibial vein. No cystic structure found in the popliteal fossa. Left: Findings consistent with acute deep vein thrombosis involving the left peroneal vein. No cystic structure found in the popliteal fossa.  TTE pending  TCD bubble study pending   PHYSICAL EXAM  Temp:  [97.5 F (36.4 C)-97.7 F (36.5 C)] 97.7 F (36.5 C) (11/06 1100) Pulse Rate:  [81-113] 94 (11/06 1300) Resp:  [12-24] 17 (11/06 1300) BP: (85-139)/(67-102) 85/67 (11/06 1300) SpO2:  [99 %-100 %] 100 % (11/06 1300) Weight:  [64.4 kg] 64.4 kg (11/06 0500)  General - Well nourished, well developed, very lethargic and lack of effort on exam.  Ophthalmologic - fundi not visualized due to noncooperation.  Cardiovascular - Regular rate and rhythm.  Neuro - awake, eyes open, not quite alert. Able to answer questions for her name and her husband name but not answer any other orientation questions. Not cooperative on exam. Did not follow most simple commands. Very limited verbal output, but with answer names, she only has mild dysarthria. PERRL, eye mid position or able to move to the right, but did  not move to the left. Blinking to visual threat on the left consistently, but inconsistent to the right visual threat. Mild left nasolabial fold flattening but not cooperative on tongue protrusion. BUE proximal 0/5 and distally 2-3/5. BLE no spontaneous movement and even on pain stimulation 0/5. DTR diminished and right babinski positive. Sensation, coordination not cooperative and gait not tested.    ASSESSMENT/PLAN [name] is a 67 y.o. female with history of DM, HTN, colitis admitted for septic shock, respiratory failure and empyema, concerning for esophageal rupture s/p EGD. After extubation, pt was found to have left gaze and right hemiplegia, aphasia. CT head showed left parietal infarct. No tPA given due to no clear LSW.    Stroke:  Left parietal small infarcts, left MCA/ACA, right parietal and pontine punctate infarcts, cardio embolic pattern, could be due to DVT in the setting of PFO vs. Endocarditis vs. Hypercoagulable state due to severe sepsis  CT head left parietal acute infarcts  CTA head and neck and CTP unremarkable  MRI  02/16/18 no acute finding  MRI 24/5/80 cardioembolic infarcts including Left parietal small infarcts, left MCA/ACA, right parietal and pontine punctate infarcts  LE venous doppler b/l LE DVT  TCD bubble study pending  2D Echo  pending  LDL 62  HgbA1c 5.1  Heparin IV for VTE prophylaxis  NPO now on TPN  aspirin 81 mg daily prior to admission, now on heparin IV.   Ongoing aggressive stroke risk factor management  Therapy recommendations:  Pending   Disposition:  Pending   Resp failure due to severe sepsis and empyema  CT chest showed free air and large pleural fluid  S/p chest tube  Intubated on 03/04/18 and now extubated on 03/09/18  Continue unasyn for Pseudomonas, E coli and entococcus in pleural fluid  ? Esophageal perforation   Now NPO on TPN  Pending Gastrografin test to evaluate the esophagus  Leukocytosis WBC  12.4->14.2  BLE DVT  LE venous doppler showed BLE DVT with right posterior tibial veins and left peroneal veins distally.  Could be the source of embolic stroke if PFO present  TCD bubble study pending  On heparin IV for DVT treatment and prevention  Stat CT if neuro changes to rule out bleeding  Diabetes  HgbA1c 5.1 goal < 7.0  Controlled  CBG monitoring  SSI  Hypotension Hx of hypertension . BP on the  low side . Not on BP meds . Continue IVF and TPN . Treat infection  Long term BP goal normotensive  Other Stroke Risk Factors  Advanced age  Other Active Problems  Anemia due to chronic disease - Hb 8.2->8.4  Thrombocytopenia - plat 122->135  Elevated LFT and AKP  Hospital day # 6  This patient is critically ill due to embolic stroke, sepsis, emphyema, respiratory failure, DVT, esophageal rupture and at significant risk of neurological worsening, death form sepsis, recurrent stroke, hemorrhagic conversion, PE, heart failure. This patient's care requires constant monitoring of vital signs, hemodynamics, respiratory and cardiac monitoring, review of multiple databases, neurological assessment, discussion with family, other specialists and medical decision making of high complexity. I spent 40 minutes of neurocritical care time in the care of this patient. I had long discussion with husband at bedside, updated pt current condition, treatment plan and potential prognosis. They expressed understanding and appreciation. I also discussed with Dr. Wynelle Cleveland over the phone.   Rosalin Hawking, MD PhD Stroke Neurology 03/10/2018 1:45 PM    To contact Stroke Continuity provider, please refer to [email or web address] After hours, contact General Neurology

## 2018-03-10 NOTE — Progress Notes (Signed)
Preliminary notes--Bilateral lower extremities venous duplex exam completed. Acute deep veins thrombosis involving right posterior tibial veins and left peroneal veins distally.  [name] [name](RDMS RVT) 03/10/18 12:49 PM

## 2018-03-10 NOTE — Progress Notes (Signed)
NAME:  Maria Avila, MRN:  [id number], DOB:  1951-02-28, LOS: 6 ADMISSION DATE:  03/04/2018, CONSULTATION DATE:  03/04/2018 REFERRING MD:  Dr. Vanita Panda, CHIEF COMPLAINT:  AMS  Brief History   67 year old female presenting from [facility] for 2 day h/o of less responsive than baseline. Large loculated L pleural effusion, s/p CT on 10/31. Off pressors 11/3.  Past Medical History  DMT2, HTN, unexplained FTT despite extensive work-up. Significant Hospital Events   10/31 admitted, CT placed, CVTS consulted 11/3 weaned off pressors  Consults: date of consult/date signed off & final recs:  CVTS: 10/31 Neurology: 11/6  Procedures (surgical and bedside):  10/31 ETT placed 10/31 Chest tube placed 11/1 TPA/pulmozyme administration  Significant Diagnostic Tests:  Chest CT 10/31 with effusion and air in pleural space CXR 11/2: near complete resolution of the left pleural effusion. CXR 11/4: persistent consolidation Left lung base MRI 11/6: Multifocal acute ischemia likely from embolic disease. Affected sites are left parietal lobe, right parietal love, and posterior left frontal lobe  Micro Data:  Blood 10/31: NG 4 days Ucx: multiple species present Tracheal aspirate: few multiple organisms present Pleural fluid: E. Coli, enteroccoccus avium. Sensitive to all but ampicillin MRSA PCR: neg  Antimicrobials:  Flagyl 10/31-10/31 (1 dose) Vancomycin 10/31>>> 11/3 Zosyn 10/31>>> 11/3 Unasyn 11/4>>>  Subjective:  CT with only 42mL out last 24 hours. Very limited verbal ability. No acute distress  Objective   Blood pressure 116/88, pulse 99, temperature (!) 97.5 F (36.4 C), temperature source Oral, resp. rate 20, height 5\' 4"  (1.626 m), weight 64.4 kg, SpO2 100 %. CVP:  [1 mmHg] 1 mmHg      Intake/Output Summary (Last 24 hours) at 03/10/2018 0906 Last data filed at 03/10/2018 0800 Gross per 24 hour  Intake 2724.05 ml  Output 1797 ml  Net 927.05 ml   Filed Weights   03/08/18 0500 03/09/18 0400 03/10/18 0500  Weight: 62.7 kg 62.7 kg 64.4 kg    Examination: General: elderly AA female. Awake, alert. Non-verbal this am. No acute distress HENT: dry MM, NG tube in place Lungs: coarse breath sounds left lung. L chest tube with minimal output, chest tube site with no erythema or bleeding.. No air leak. Cardiovascular: rrr, S1/S2 normal. Warm extremities. Palpable peripheral pulses Abdomen: soft, non-tender, non-distended Extremities: 2+ pitting edema BLE Neuro: does not follow commands. Does not look right. Right arm very rigid, contracted up against body. GU: foley catheter in place. No s/s of skin breakdown  Resolved Hospital Problem list   Septic shock Hypophosphatemia  Assessment & Plan:  Septic shock now resolved. Off pressors and sedation. Doing well s/p extubation. On 2L [place]. Ok for swallow study to evaluate for esophageal perf. Will leave CT in place until after swallow. Continue unasyn. k 3.1 repleted. Phos 1.7, repleted.  PLAN Left lung empyema - continue unasyn (11/3- ) - monitor fever curve, wbc  Embolic stroke  - neurology following - aspirin and statin - speech eval for cognitive and swallow eval - cardiac monitoring - BLE U/S and Echo pending  ? Esophageal perf - gasrograffin swallow when able - if negative, can d/c chest tube - NPO and NG tube  FEN/GI - TPN per pharmacy - NPO, NGT    Diet: NPO strict! suppository for constipation. Pain/Anxiety/Delirium protocol (if indicated): none VAP protocol (if indicated): in place DVT prophylaxis: SCDs GI prophylaxis: protonix iv Hyperglycemia protocol: [place] correction scale. Mobility: bedrest. Antibiotic de-escalation: Unasyn. Code Status: partial, ok for intubation.  No cpr Family Communication: at bedside  Labs (personally reviewed)  CBC: Recent Labs  Lab 03/06/18 0408 03/07/18 0352 03/08/18 0414 03/09/18 0350 03/10/18 0451  WBC 20.1* 16.9* 13.1* 12.4* 14.2*  NEUTROABS  16.2* 14.5* 9.9* 11.3* 11.9*  HGB 9.6* 8.6* 8.5* 8.2* 8.4*  HCT 28.8* 24.7* 24.2* 24.6* 26.0*  MCV 90.3 88.5 88.3 90.1 90.9  PLT 183 121* 102* 122* 135*    Basic Metabolic Panel: Recent Labs  Lab 03/05/18 0402 03/06/18 0408 03/07/18 0352 03/08/18 0414 03/08/18 1846 03/08/18 2021 03/09/18 0350 03/10/18 0451  NA 136 137 137 143 141 140 143 139  K 3.7 3.1* 3.5 2.6* 5.8* 5.6* 3.1* 3.7  CL 111 115* 117* 117* 116* 113* 115* 108  CO2 15* 17* 17* 21* 20* 24 24 28   GLUCOSE 139* 121* 180* 149* 107* 117* 123* 125*  BUN 31* 33* 30* 24* 23 25* 19 17  CREATININE 1.94* 1.49* 1.25* 0.93 0.75 0.77 0.66 0.42*  CALCIUM 8.1* 7.9* 8.1* 8.2* 8.0* 8.0* 8.1* 7.8*  MG 1.5* 1.9 2.0 1.7  --   --  2.0  --   PHOS 4.9* 3.9 2.9 2.7  --   --  1.7* 2.7   GFR: Estimated Creatinine Clearance: 58.1 mL/min (A) (by C-G formula based on SCr of 0.42 mg/dL (L)). Recent Labs  Lab 03/04/18 1238 03/04/18 1742 03/04/18 1746 03/04/18 2251  03/05/18 0523  03/07/18 0352 03/08/18 0414 03/09/18 0350 03/10/18 0451  PROCALCITON  --  24.42  --   --   --   --   --   --   --   --   --   WBC  --  30.2*  --   --    < >  --    < > 16.9* 13.1* 12.4* 14.2*  LATICACIDVEN 3.03*  --  4.8* 6.7*  --  4.4*  --   --   --   --   --    < > = values in this interval not displayed.    Liver Function Tests: Recent Labs  Lab 03/06/18 0408 03/07/18 0352 03/08/18 0414 03/09/18 0350 03/10/18 0451  AST 29 51* 56* 55* 49*  ALT 25 37 47* 50* 49*  ALKPHOS 203* 341* 380* 475* 408*  BILITOT 1.7* 1.3* 0.8 0.8 0.7  PROT 4.9* 4.5* 4.6* 4.4* 4.3*  ALBUMIN 1.6* 1.4* 1.4* 1.3* 1.3*   Recent Labs  Lab 03/04/18 1742  LIPASE 20  AMYLASE 59   No results for input(s): AMMONIA in the last 168 hours.  ABG    Component Value Date/Time   PHART 7.334 (L) 03/06/2018 0406   PCO2ART 32.8 03/06/2018 0406   PO2ART 157.0 (H) 03/06/2018 0406   HCO3 17.6 (L) 03/06/2018 0406   TCO2 19 (L) 03/06/2018 0406   ACIDBASEDEF 8.0 (H) 03/06/2018 0406     O2SAT 99.0 03/06/2018 0406     Coagulation Profile: Recent Labs  Lab 03/04/18 1742 03/05/18 0944 03/08/18 0414  INR 1.95 2.08 1.45    Cardiac Enzymes: No results for input(s): CKTOTAL, CKMB, CKMBINDEX, TROPONINI in the last 168 hours.  HbA1C: Hgb A1c MFr Bld  Date/Time Value Ref Range Status  03/10/2018 04:51 AM 5.1 4.8 - 5.6 % Final    Comment:    (NOTE) Pre diabetes:          5.7%-6.4% Diabetes:              >6.4% Glycemic control for   <7.0% adults with diabetes  CBG: Recent Labs  Lab 03/09/18 1140 03/09/18 1532 03/09/18 2001 03/09/18 2312 03/10/18 0626  GLUCAP 120* 142* 91 120* 102*

## 2018-03-10 NOTE — Progress Notes (Signed)
First attempt to call report to [phone]

## 2018-03-10 NOTE — Progress Notes (Signed)
PCCM progress update  SLP evaluated for swallow study. Did not pass and is at high risk for aspiration. Will hold off on gastrograffin study until better able to tolerate PO. May help possible esophageal perf heal regardless. SLP to follow along to help with eval. Pressures ok and felt to be ok for stepdown unit. Discussed with dr. Wynelle Cleveland, triad hospitalists to pick up in am of 11/7. PCCM will follow along for chest tube management.  [name] MD PGY-2 Family Medicine Resident

## 2018-03-10 NOTE — Progress Notes (Addendum)
Pharmacy Antibiotic Note  Maria Avila is a 67 y.o. female admitted on 03/04/2018 with an empyema and septic shock. Previously on vancomycin and Zosyn. Pleural fluid culture now growing E. coli and Enterococcus avium as well as pseudomonas. Pharmacy consulted to dose Unasyn. WBC down 14.2, afebrile. Scr trending down, now 0.42 and estimated CrCl ~58 mL/min.  Plan: Continue Unasyn 3g IV q8h Not treating pseudomonas for now as pt is clinically improved Consider LOT and PO switch after gastrografin study F/u clinical status, renal function  Height: 5\' 4"  (162.6 cm) Weight: 141 lb 15.6 oz (64.4 kg) IBW/kg (Calculated) : 54.7  Temp (24hrs), Avg:97.6 F (36.4 C), Min:97.5 F (36.4 C), Max:97.7 F (36.5 C)  Recent Labs  Lab 03/04/18 0923 03/04/18 1238  03/04/18 1746 03/04/18 2251  03/05/18 0523 03/06/18 0408 03/07/18 0352 03/08/18 0414 03/08/18 1846 03/08/18 2021 03/09/18 0350 03/10/18 0451  WBC  --   --    < >  --   --    < >  --  20.1* 16.9* 13.1*  --   --  12.4* 14.2*  CREATININE  --   --    < >  --   --    < >  --  1.49* 1.25* 0.93 0.75 0.77 0.66 0.42*  LATICACIDVEN 3.08* 3.03*  --  4.8* 6.7*  --  4.4*  --   --   --   --   --   --   --    < > = values in this interval not displayed.    Antimicrobials this admission: Flagyl 10/31 x1 Cefepime 10/31 x1 Vancomycin 10/31 >> 11/3 Zosyn 10/31 >> 11/3 Unasyn 11/3 >>  Microbiology results: 10/31 MRSA PCR: negative 10/31 BCx: NG x3 10/31 TA cx: multiple organisms 10/31 pleural fluid cx: E.coli (S-Unasyn), Enterococcus avium (S-amp), pseudomonas (pan-sensitive)  Janae Bridgeman, PharmD PGY1 Pharmacy Resident Phone: [phone] 03/10/2018 10:58 AM

## 2018-03-10 NOTE — Progress Notes (Addendum)
ANTICOAGULATION CONSULT NOTE  Pharmacy Consult:  Heparin Indication: DVT  Allergies  Allergen Reactions  . Crestor [Rosuvastatin Calcium] Swelling and Other (See Comments)    Swelling of legs and muscle weakness  . Zocor [Simvastatin] Swelling and Other (See Comments)    Swelling of legs and muscle weakness    Patient Measurements: Height: 5\' 4"  (162.6 cm) Weight: 141 lb 15.6 oz (64.4 kg) IBW/kg (Calculated) : 54.7 HEPARIN DW (KG): 54.7  Vital Signs: Temp: 98.5 F (36.9 C) (11/06 1918) Temp Source: Oral (11/06 1918) BP: 122/82 (11/06 2000) Pulse Rate: 111 (11/06 2000)  Labs: Recent Labs    03/08/18 0414  03/08/18 2021 03/09/18 0350 03/10/18 0451 03/10/18 2047  HGB 8.5*  --   --  8.2* 8.4*  --   HCT 24.2*  --   --  24.6* 26.0*  --   PLT 102*  --   --  122* 135*  --   LABPROT 17.5*  --   --   --   --   --   INR 1.45  --   --   --   --   --   HEPARINUNFRC  --   --   --   --   --  0.58  CREATININE 0.93   < > 0.77 0.66 0.42*  --    < > = values in this interval not displayed.    Estimated Creatinine Clearance: 58.1 mL/min (A) (by C-G formula based on SCr of 0.42 mg/dL (L)).    Assessment: Maria Avila is a 10y old female presented with altered mental status from SNF, found to have empyema. Pharmacy now consulted to dose heparin in setting of DVT. 11/6 MRI: Multifocal acute ischemia likely from embolic disease. Affected sites are left parietal lobe, right parietal love, and posterior left frontal lobe. Will be conservative in dosing. No bolus.  Heparin level is slightly above goal.  No bleeding reported.  Goal of Therapy:  Heparin level 0.3-0.5 units/mL Monitor platelets by anticoagulation protocol: Yes   Plan:  Reduce heparin gtt to 700 units/hr Check 6 hr heparin level   Lynnex Fulp D. Mina Marble, PharmD, BCPS, [place] 03/10/2018, 9:27 PM

## 2018-03-10 NOTE — Care Management Note (Signed)
Case Management Note  Patient Details  Name: Jolanda E Martinique MRN: [id number] Date of Birth: 11/04/1950  Subjective/Objective:    Pt admitted with large loculated pleural effusion                 Action/Plan:  Pt is from [facility] following   Expected Discharge Date:  (unknown)               Expected Discharge Plan:  Skilled Nursing Facility  In-House Referral:  Clinical Social Work  Discharge planning Services  CM Consult  Post Acute Care Choice:    Choice offered to:     DME Arranged:    DME Agency:     HH Arranged:    [place] Agency:     Status of Service:  In process, will continue to follow  If discussed at Long Length of Stay Meetings, dates discussed:    Additional Comments: 03/10/2018 Pt is s/p CT placement - CT remains on suction.  Pt currently NPO and failed swallow study today.  CM/CSW will continue to follow for medical readiness for discharge Maryclare Labrador, RN 03/10/2018, 3:02 PM

## 2018-03-10 NOTE — Progress Notes (Signed)
SLP Cancellation Note  Patient Details Name: Maria Avila MRN: [id number] DOB: [birth year]   Cancelled treatment:       Reason Eval/Treat Not Completed: Medical issues which prohibited therapy - pt pending gastrograffin study to assess for esophageal leak. BSE ordered for after completion. Will f/u as able pending results. Given MRI suggestive of acute infarcts, recommend MD consider ordering SLP cognitive-linguistic evaluation as well.   Germain Osgood 03/10/2018, 8:44 AM  Germain Osgood, M.A. [place] Acute Environmental education officer [phone] Office [phone]

## 2018-03-11 ENCOUNTER — Ambulatory Visit (HOSPITAL_COMMUNITY): Payer: Medicare Other

## 2018-03-11 DIAGNOSIS — I639 Cerebral infarction, unspecified: Secondary | ICD-10-CM | POA: Insufficient documentation

## 2018-03-11 DIAGNOSIS — Z86718 Personal history of other venous thrombosis and embolism: Secondary | ICD-10-CM | POA: Insufficient documentation

## 2018-03-11 DIAGNOSIS — I82453 Acute embolism and thrombosis of peroneal vein, bilateral: Secondary | ICD-10-CM

## 2018-03-11 DIAGNOSIS — A419 Sepsis, unspecified organism: Secondary | ICD-10-CM

## 2018-03-11 DIAGNOSIS — D72829 Elevated white blood cell count, unspecified: Secondary | ICD-10-CM

## 2018-03-11 DIAGNOSIS — K223 Perforation of esophagus: Secondary | ICD-10-CM

## 2018-03-11 DIAGNOSIS — I6340 Cerebral infarction due to embolism of unspecified cerebral artery: Secondary | ICD-10-CM

## 2018-03-11 DIAGNOSIS — A4152 Sepsis due to Pseudomonas: Secondary | ICD-10-CM

## 2018-03-11 DIAGNOSIS — Z9689 Presence of other specified functional implants: Secondary | ICD-10-CM

## 2018-03-11 DIAGNOSIS — R6520 Severe sepsis without septic shock: Secondary | ICD-10-CM

## 2018-03-11 DIAGNOSIS — N179 Acute kidney failure, unspecified: Secondary | ICD-10-CM

## 2018-03-11 LAB — MAGNESIUM: Magnesium: 1.7 mg/dL (ref 1.7–2.4)

## 2018-03-11 LAB — COMPREHENSIVE METABOLIC PANEL
ALBUMIN: 1.4 g/dL — AB (ref 3.5–5.0)
ALT: 36 U/L (ref 0–44)
ANION GAP: 5 (ref 5–15)
AST: 34 U/L (ref 15–41)
Alkaline Phosphatase: 326 U/L — ABNORMAL HIGH (ref 38–126)
BUN: 14 mg/dL (ref 8–23)
CHLORIDE: 110 mmol/L (ref 98–111)
CO2: 25 mmol/L (ref 22–32)
Calcium: 7.7 mg/dL — ABNORMAL LOW (ref 8.9–10.3)
Creatinine, Ser: 0.4 mg/dL — ABNORMAL LOW (ref 0.44–1.00)
GFR calc Af Amer: >60 mL/min (ref >60)
GFR calc non Af Amer: >60 mL/min (ref >60)
GLUCOSE: 80 mg/dL (ref 70–99)
POTASSIUM: 3.9 mmol/L (ref 3.5–5.1)
SODIUM: 140 mmol/L (ref 135–145)
TOTAL PROTEIN: 4.5 g/dL — AB (ref 6.5–8.1)
Total Bilirubin: 0.5 mg/dL (ref 0.3–1.2)

## 2018-03-11 LAB — CBC
HCT: 25.9 % — ABNORMAL LOW (ref 36.0–46.0)
Hemoglobin: 8.6 g/dL — ABNORMAL LOW (ref 12.0–15.0)
MCH: 30.8 pg (ref 26.0–34.0)
MCHC: 33.2 g/dL (ref 30.0–36.0)
MCV: 92.8 fL (ref 80.0–100.0)
Platelets: 165 10*3/uL (ref 150–400)
RBC: 2.79 MIL/uL — ABNORMAL LOW (ref 3.87–5.11)
RDW: 16.1 % — AB (ref 11.5–15.5)
WBC: 17.4 10*3/uL — AB (ref 4.0–10.5)
nRBC: 0.2 % (ref 0.0–0.2)

## 2018-03-11 LAB — GLUCOSE, CAPILLARY
Glucose-Capillary: 111 mg/dL — ABNORMAL HIGH (ref 70–99)
Glucose-Capillary: 130 mg/dL — ABNORMAL HIGH (ref 70–99)
Glucose-Capillary: 154 mg/dL — ABNORMAL HIGH (ref 70–99)
Glucose-Capillary: 74 mg/dL (ref 70–99)

## 2018-03-11 LAB — HEPARIN LEVEL (UNFRACTIONATED): HEPARIN UNFRACTIONATED: 0.49 [IU]/mL (ref 0.30–0.70)

## 2018-03-11 LAB — PHOSPHORUS: Phosphorus: 2.9 mg/dL (ref 2.5–4.6)

## 2018-03-11 MED ORDER — TRAVASOL 10 % IV SOLN
INTRAVENOUS | Status: AC
  Administered 2018-03-11: 17:00:00 via INTRAVENOUS
  Filled 2018-03-11: qty 1062

## 2018-03-11 MED ORDER — COLLAGENASE 250 UNIT/GM EX OINT
TOPICAL_OINTMENT | Freq: Every day | CUTANEOUS | Status: DC
  Administered 2018-03-11 – 2018-03-17 (×7): via TOPICAL
  Filled 2018-03-11: qty 30

## 2018-03-11 MED ORDER — ALTEPLASE 2 MG IJ SOLR
2.0000 mg | Freq: Once | INTRAMUSCULAR | Status: AC
  Administered 2018-03-11: 2 mg
  Filled 2018-03-11: qty 2

## 2018-03-11 NOTE — Progress Notes (Signed)
TCD bubble completed with Dr. Erlinda Hong.  Limited study due to poor patient cooperation, unable to perform Valsalva. No HITS heard at rest. No evidence of PFO with this test.  Landry Mellow, RDMS, RVT

## 2018-03-11 NOTE — Progress Notes (Addendum)
STROKE TEAM PROGRESS NOTE   SUBJECTIVE (INTERVAL HISTORY) Pt was seen at the vascular lab for TCD bubble study. Pt was much more awake alert and interactive. Orientated to place and self, not orientated to time or age. Had TCD bubble study showed no PFO at rest. Currently on heparin IV for DVT treatment.   OBJECTIVE Temp:  [97.6 F (36.4 C)-98.5 F (36.9 C)] 97.6 F (36.4 C) (11/07 0740) Pulse Rate:  [86-111] 104 (11/07 0740) Cardiac Rhythm: Sinus tachycardia (11/06 2230) Resp:  [17-24] 23 (11/07 0740) BP: (74-128)/(51-92) 115/86 (11/07 0740) SpO2:  [95 %-100 %] 100 % (11/07 0740) Weight:  [65.1 kg] 65.1 kg (11/06 2141)  Recent Labs  Lab 03/10/18 0626 03/10/18 1102 03/10/18 1804 03/11/18 0018 03/11/18 0629  GLUCAP 102* 132* 126* 130* 74   Recent Labs  Lab 03/05/18 0402 03/06/18 0408 03/07/18 0352 03/08/18 0414 03/08/18 1846 03/08/18 2021 03/09/18 0350 03/10/18 0451  NA 136 137 137 143 141 140 143 139  K 3.7 3.1* 3.5 2.6* 5.8* 5.6* 3.1* 3.7  CL 111 115* 117* 117* 116* 113* 115* 108  CO2 15* 17* 17* 21* 20* 24 24 28   GLUCOSE 139* 121* 180* 149* 107* 117* 123* 125*  BUN 31* 33* 30* 24* 23 25* 19 17  CREATININE 1.94* 1.49* 1.25* 0.93 0.75 0.77 0.66 0.42*  CALCIUM 8.1* 7.9* 8.1* 8.2* 8.0* 8.0* 8.1* 7.8*  MG 1.5* 1.9 2.0 1.7  --   --  2.0  --   PHOS 4.9* 3.9 2.9 2.7  --   --  1.7* 2.7   Recent Labs  Lab 03/06/18 0408 03/07/18 0352 03/08/18 0414 03/09/18 0350 03/10/18 0451  AST 29 51* 56* 55* 49*  ALT 25 37 47* 50* 49*  ALKPHOS 203* 341* 380* 475* 408*  BILITOT 1.7* 1.3* 0.8 0.8 0.7  PROT 4.9* 4.5* 4.6* 4.4* 4.3*  ALBUMIN 1.6* 1.4* 1.4* 1.3* 1.3*   Recent Labs  Lab 03/06/18 0408 03/07/18 0352 03/08/18 0414 03/09/18 0350 03/10/18 0451 03/11/18 0500  WBC 20.1* 16.9* 13.1* 12.4* 14.2* 17.4*  NEUTROABS 16.2* 14.5* 9.9* 11.3* 11.9*  --   HGB 9.6* 8.6* 8.5* 8.2* 8.4* 8.6*  HCT 28.8* 24.7* 24.2* 24.6* 26.0* 25.9*  MCV 90.3 88.5 88.3 90.1 90.9 92.8  PLT 183  121* 102* 122* 135* 165   No results for input(s): CKTOTAL, CKMB, CKMBINDEX, TROPONINI in the last 168 hours. No results for input(s): LABPROT, INR in the last 72 hours. No results for input(s): COLORURINE, LABSPEC, [place], GLUCOSEU, HGBUR, BILIRUBINUR, KETONESUR, PROTEINUR, UROBILINOGEN, NITRITE, LEUKOCYTESUR in the last 72 hours.  Invalid input(s): APPERANCEUR     Component Value Date/Time   CHOL 93 03/10/2018 0451   TRIG 81 03/10/2018 0451   HDL 15 (L) 03/10/2018 0451   CHOLHDL 6.2 03/10/2018 0451   VLDL 16 03/10/2018 0451   LDLCALC 62 03/10/2018 0451   Lab Results  Component Value Date   HGBA1C 5.1 03/10/2018   No results found for: LABOPIA, COCAINSCRNUR, LABBENZ, AMPHETMU, THCU, LABBARB  No results for input(s): ETH in the last 168 hours.  I have personally reviewed the radiological images below and agree with the radiology interpretations.  Ct Angio Head W Or Wo Contrast  Result Date: 03/09/2018 CLINICAL DATA:  67 y/o F; patient extubated yesterday. Right-sided weakness and slow speech. Increased lethargy. EXAM: CT ANGIOGRAPHY HEAD AND NECK CT PERFUSION BRAIN TECHNIQUE: Multidetector CT imaging of the head and neck was performed using the standard protocol during bolus administration of intravenous contrast. Multiplanar CT  image reconstructions and MIPs were obtained to evaluate the vascular anatomy. Carotid stenosis measurements (when applicable) are obtained utilizing NASCET criteria, using the distal internal carotid diameter as the denominator. Multiphase CT imaging of the brain was performed following IV bolus contrast injection. Subsequent parametric perfusion maps were calculated using RAPID software. CONTRAST:  90 cc Isovue 370 COMPARISON:  03/09/2018, 03/04/2018, 02/16/2018 CT head. 02/16/2018 MRI head. FINDINGS: CTA NECK FINDINGS Aortic arch: Standard branching. Imaged portion shows no evidence of aneurysm or dissection. No significant stenosis of the major arch vessel  origins. Mild calcific aortic atherosclerosis. Right carotid system: No evidence of dissection, stenosis (50% or greater) or occlusion. Mild non stenotic calcific atherosclerosis of the carotid bifurcation. Left carotid system: No evidence of dissection, stenosis (50% or greater) or occlusion. Minimal non stenotic calcific atherosclerosis of the carotid bifurcation. Vertebral arteries: Left dominant, diminutive right vertebral artery. No evidence of dissection, stenosis (50% or greater) or occlusion. Skeleton: Moderate spondylosis of the cervical spine with multilevel disc and facet degenerative changes. No high-grade bony canal stenosis. Other neck: Negative. Upper chest: Negative. Review of the MIP images confirms the above findings CTA HEAD FINDINGS Anterior circulation: No significant stenosis, proximal occlusion, aneurysm, or vascular malformation. Mild non stenotic calcific atherosclerosis of the carotid siphons. Posterior circulation: No significant stenosis, proximal occlusion, aneurysm, or vascular malformation. Venous sinuses: As permitted by contrast timing, patent. Anatomic variants: Right vertebral artery largely terminates in the right PICA with diminutive contribution to the basilar. Complete circle-of-Willis. Large left A1, large anterior communicating artery, diminutive right A1, normal variant Review of the MIP images confirms the above findings CT Brain Perfusion Findings: CBF (<30%) Volume: 38mL Perfusion (Tmax>6.0s) volume: 45mL Mismatch Volume: 38mL Infarction Location:Left parietal lobe subacute infarction visible on noncontrast CT, pseudonormalized perfusion. IMPRESSION: 1. Patent carotid and vertebral arteries. No dissection, aneurysm, or hemodynamically significant stenosis utilizing NASCET criteria. 2. Patent anterior and posterior intracranial circulation. No large vessel occlusion, aneurysm, or significant stenosis. 3. Left parietal lobe subacute infarction visible on noncontrast CT with  pseudonormalized perfusion. 4. No perfusion anomaly to suggest interval acute infarct. Electronically Signed   By: Kristine Garbe M.D.   On: 03/09/2018 18:44   Ct Head Wo Contrast  Result Date: 03/09/2018 CLINICAL DATA:  Residual right-sided weakness. EXAM: CT HEAD WITHOUT CONTRAST TECHNIQUE: Contiguous axial images were obtained from the base of the skull through the vertex without intravenous contrast. COMPARISON:  03/04/2018 CT.  02/16/2018 MRI. FINDINGS: Brain: There is acute to subacute infarction in the left parietal lobe consistent with left MCA branch vessel infarction. The brain shows low-density with mild swelling. No hemorrhage. No other abnormal brain finding. No mass, hydrocephalus or extra-axial collection. Vascular: There is atherosclerotic calcification of the major vessels at the base of the brain. Skull: Negative Sinuses/Orbits: Clear/normal Other: None IMPRESSION: Acute/subacute infarction in the left parietal lobe consistent with MCA branch vessel infarction. No evidence of hemorrhage or mass effect. Electronically Signed   By: Nelson Chimes M.D.   On: 03/09/2018 13:31   Ct Head Wo Contrast  Result Date: 03/04/2018 CLINICAL DATA:  Altered level of consciousness EXAM: CT HEAD WITHOUT CONTRAST TECHNIQUE: Contiguous axial images were obtained from the base of the skull through the vertex without intravenous contrast. COMPARISON:  02/16/2018 FINDINGS: Brain: No evidence of acute infarction, hemorrhage, hydrocephalus, extra-axial collection or mass lesion/mass effect. Vascular: No hyperdense vessel or unexpected calcification. Skull: Normal. Negative for fracture or focal lesion. Sinuses/Orbits: No acute finding. Other: None. IMPRESSION: No acute abnormality noted.  No change from the prior exam. Electronically Signed   By: Inez Catalina M.D.   On: 03/04/2018 09:33   Ct Head Wo Contrast  Result Date: 02/16/2018 CLINICAL DATA:  Generalize weakness and slurred speech for 2 weeks.  EXAM: CT HEAD WITHOUT CONTRAST TECHNIQUE: Contiguous axial images were obtained from the base of the skull through the vertex without intravenous contrast. COMPARISON:  04/01/2005 FINDINGS: Brain: No evidence of acute infarction, hemorrhage, hydrocephalus, extra-axial collection or mass lesion/mass effect. Vascular: Mild intracranial arterial vascular calcifications. Skull: Calvarium appears intact. Sinuses/Orbits: Paranasal sinuses and mastoid air cells are clear. Other: None. IMPRESSION: No acute intracranial abnormality. Electronically Signed   By: Lucienne Capers M.D.   On: 02/16/2018 06:24   Ct Angio Neck W Or Wo Contrast  Result Date: 03/09/2018 CLINICAL DATA:  67 y/o F; patient extubated yesterday. Right-sided weakness and slow speech. Increased lethargy. EXAM: CT ANGIOGRAPHY HEAD AND NECK CT PERFUSION BRAIN TECHNIQUE: Multidetector CT imaging of the head and neck was performed using the standard protocol during bolus administration of intravenous contrast. Multiplanar CT image reconstructions and MIPs were obtained to evaluate the vascular anatomy. Carotid stenosis measurements (when applicable) are obtained utilizing NASCET criteria, using the distal internal carotid diameter as the denominator. Multiphase CT imaging of the brain was performed following IV bolus contrast injection. Subsequent parametric perfusion maps were calculated using RAPID software. CONTRAST:  90 cc Isovue 370 COMPARISON:  03/09/2018, 03/04/2018, 02/16/2018 CT head. 02/16/2018 MRI head. FINDINGS: CTA NECK FINDINGS Aortic arch: Standard branching. Imaged portion shows no evidence of aneurysm or dissection. No significant stenosis of the major arch vessel origins. Mild calcific aortic atherosclerosis. Right carotid system: No evidence of dissection, stenosis (50% or greater) or occlusion. Mild non stenotic calcific atherosclerosis of the carotid bifurcation. Left carotid system: No evidence of dissection, stenosis (50% or greater)  or occlusion. Minimal non stenotic calcific atherosclerosis of the carotid bifurcation. Vertebral arteries: Left dominant, diminutive right vertebral artery. No evidence of dissection, stenosis (50% or greater) or occlusion. Skeleton: Moderate spondylosis of the cervical spine with multilevel disc and facet degenerative changes. No high-grade bony canal stenosis. Other neck: Negative. Upper chest: Negative. Review of the MIP images confirms the above findings CTA HEAD FINDINGS Anterior circulation: No significant stenosis, proximal occlusion, aneurysm, or vascular malformation. Mild non stenotic calcific atherosclerosis of the carotid siphons. Posterior circulation: No significant stenosis, proximal occlusion, aneurysm, or vascular malformation. Venous sinuses: As permitted by contrast timing, patent. Anatomic variants: Right vertebral artery largely terminates in the right PICA with diminutive contribution to the basilar. Complete circle-of-Willis. Large left A1, large anterior communicating artery, diminutive right A1, normal variant Review of the MIP images confirms the above findings CT Brain Perfusion Findings: CBF (<30%) Volume: 36mL Perfusion (Tmax>6.0s) volume: 63mL Mismatch Volume: 60mL Infarction Location:Left parietal lobe subacute infarction visible on noncontrast CT, pseudonormalized perfusion. IMPRESSION: 1. Patent carotid and vertebral arteries. No dissection, aneurysm, or hemodynamically significant stenosis utilizing NASCET criteria. 2. Patent anterior and posterior intracranial circulation. No large vessel occlusion, aneurysm, or significant stenosis. 3. Left parietal lobe subacute infarction visible on noncontrast CT with pseudonormalized perfusion. 4. No perfusion anomaly to suggest interval acute infarct. Electronically Signed   By: Kristine Garbe M.D.   On: 03/09/2018 18:44   Ct Chest Wo Contrast  Result Date: 03/04/2018 CLINICAL DATA:  Shortness of breath, pneumonia EXAM: CT CHEST  WITHOUT CONTRAST TECHNIQUE: Multidetector CT imaging of the chest was performed following the standard protocol without IV contrast. COMPARISON:  Chest x-ray 03/04/2018  FINDINGS: Cardiovascular: Heart is normal size. Aorta is normal caliber with scattered aortic calcifications. Moderate coronary artery calcifications in the left anterior descending coronary artery. Mediastinum/Nodes: Reactive borderline sized mediastinal lymph nodes. Lungs/Pleura: Complex large left pleural effusions with loculation and pleural gas. Cannot exclude bronchopleural fistula or empyema. Collapse of much of the left lung with only a small amount of aerated left upper lobe. Nodular airspace opacities in the right upper lobe and superior segment of the right lower lobe with trace right pleural effusion. Upper Abdomen: Imaging into the upper abdomen shows no acute findings. Musculoskeletal: Chest wall soft tissues are unremarkable. Degenerative changes in the thoracic spine. No acute bony abnormality. IMPRESSION: Large complex left pleural effusion with pleural gas and extensive loculations. This could reflect bronchopleural fistula or empyema. Collapse of much of the left lung with only a small amount of aerated left upper lobe. Nodular ground-glass airspace opacities centrally in the right upper lobe and in the superior segment of the right lower lobe, likely infectious/inflammatory. Coronary artery disease, aortic atherosclerosis. Electronically Signed   By: Rolm Baptise M.D.   On: 03/04/2018 11:39   Mr Brain Wo Contrast  Result Date: 03/10/2018 CLINICAL DATA:  Stroke follow-up EXAM: MRI HEAD WITHOUT CONTRAST TECHNIQUE: Multiplanar, multiecho pulse sequences of the brain and surrounding structures were obtained without intravenous contrast. COMPARISON:  Brain MRI 02/16/2018 FINDINGS: BRAIN: There are multiple foci of abnormal diffusion restriction, the largest of which is located in the left parietal lobe. Other foci are located in  the posterior left frontal lobe, right parietal lobe and the pons. There is no midline shift or mass effect. Mild cytotoxic edema of the left parietal lobe. Minimal white matter hyperintensity for age. Brain parenchyma is otherwise normal. The cerebral and cerebellar volume are age-appropriate. Susceptibility-sensitive sequences show no chronic microhemorrhage or superficial siderosis. VASCULAR: Major intracranial arterial and venous sinus flow voids are normal. SKULL AND UPPER CERVICAL SPINE: Calvarial bone marrow signal is normal. There is no skull base mass. Visualized upper cervical spine and soft tissues are normal. SINUSES/ORBITS: No fluid levels or advanced mucosal thickening. No mastoid or middle ear effusion. The orbits are normal. IMPRESSION: 1. Multifocal acute ischemia, spread throughout multiple bilateral vascular territories. 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 2. The largest infarction is in the left parietal lobe. Other sites include the posterior left frontal lobe, right parietal lobe and pons. 3. No hemorrhage or mass effect. Electronically Signed   By: Ulyses Jarred M.D.   On: 03/10/2018 06:17   Mr Brain Wo Contrast  Result Date: 02/16/2018 CLINICAL DATA:  Weight loss and weakness. EXAM: MRI HEAD WITHOUT CONTRAST TECHNIQUE: Multiplanar, multiecho pulse sequences of the brain and surrounding structures were obtained without intravenous contrast. COMPARISON:  CT same day FINDINGS: Brain: Generalized atrophy. Diffusion imaging does not show any acute or subacute infarction. The brainstem and cerebellum are normal. Mild chronic small-vessel ischemic change of the cerebral hemispheric deep white matter. No cortical or large vessel territory infarction. No mass lesion, hemorrhage, hydrocephalus or extra-axial collection. Vascular: Major vessels at the base of the brain show flow. Skull and upper cervical spine: Negative Sinuses/Orbits: Clear/normal Other: None IMPRESSION: No acute or  reversible finding. Generalized atrophy. Mild chronic small-vessel ischemic change of the cerebral hemispheric white matter. Electronically Signed   By: Nelson Chimes M.D.   On: 02/16/2018 21:52   Nm Hepato W/eject Fract  Result Date: 02/18/2018 CLINICAL DATA:  Weight loss. EXAM: NUCLEAR MEDICINE HEPATOBILIARY IMAGING WITH GALLBLADDER EF TECHNIQUE:  Sequential images of the abdomen were obtained out to 60 minutes following intravenous administration of radiopharmaceutical. After oral ingestion of Ensure, gallbladder ejection fraction was determined. At 60 min, normal ejection fraction is greater than 33%. RADIOPHARMACEUTICALS:  5.3 mCi Tc-69m  Choletec IV COMPARISON:  None. FINDINGS: Prompt uptake and biliary excretion of activity by the liver is seen. Gallbladder activity is visualized, consistent with patency of cystic duct. Biliary activity passes into small bowel, consistent with patent common bile duct. The gallbladder emptied spontaneously before the Ensure. As a result, an ejection fraction cannot be accurately calculated. That being said, the gallbladder is not visualized after ensure and gallbladder emptying is near-complete. IMPRESSION: The gallbladder emptied spontaneously before consumption of Ensure. While an ejection fraction cannot be calculated, gallbladder emptying is near complete. Electronically Signed   By: Dorise Bullion III M.D   On: 02/18/2018 13:40   Ct Cerebral Perfusion W Contrast  Result Date: 03/09/2018 CLINICAL DATA:  67 y/o F; patient extubated yesterday. Right-sided weakness and slow speech. Increased lethargy. EXAM: CT ANGIOGRAPHY HEAD AND NECK CT PERFUSION BRAIN TECHNIQUE: Multidetector CT imaging of the head and neck was performed using the standard protocol during bolus administration of intravenous contrast. Multiplanar CT image reconstructions and MIPs were obtained to evaluate the vascular anatomy. Carotid stenosis measurements (when applicable) are obtained utilizing  NASCET criteria, using the distal internal carotid diameter as the denominator. Multiphase CT imaging of the brain was performed following IV bolus contrast injection. Subsequent parametric perfusion maps were calculated using RAPID software. CONTRAST:  90 cc Isovue 370 COMPARISON:  03/09/2018, 03/04/2018, 02/16/2018 CT head. 02/16/2018 MRI head. FINDINGS: CTA NECK FINDINGS Aortic arch: Standard branching. Imaged portion shows no evidence of aneurysm or dissection. No significant stenosis of the major arch vessel origins. Mild calcific aortic atherosclerosis. Right carotid system: No evidence of dissection, stenosis (50% or greater) or occlusion. Mild non stenotic calcific atherosclerosis of the carotid bifurcation. Left carotid system: No evidence of dissection, stenosis (50% or greater) or occlusion. Minimal non stenotic calcific atherosclerosis of the carotid bifurcation. Vertebral arteries: Left dominant, diminutive right vertebral artery. No evidence of dissection, stenosis (50% or greater) or occlusion. Skeleton: Moderate spondylosis of the cervical spine with multilevel disc and facet degenerative changes. No high-grade bony canal stenosis. Other neck: Negative. Upper chest: Negative. Review of the MIP images confirms the above findings CTA HEAD FINDINGS Anterior circulation: No significant stenosis, proximal occlusion, aneurysm, or vascular malformation. Mild non stenotic calcific atherosclerosis of the carotid siphons. Posterior circulation: No significant stenosis, proximal occlusion, aneurysm, or vascular malformation. Venous sinuses: As permitted by contrast timing, patent. Anatomic variants: Right vertebral artery largely terminates in the right PICA with diminutive contribution to the basilar. Complete circle-of-Willis. Large left A1, large anterior communicating artery, diminutive right A1, normal variant Review of the MIP images confirms the above findings CT Brain Perfusion Findings: CBF (<30%)  Volume: 3mL Perfusion (Tmax>6.0s) volume: 57mL Mismatch Volume: 78mL Infarction Location:Left parietal lobe subacute infarction visible on noncontrast CT, pseudonormalized perfusion. IMPRESSION: 1. Patent carotid and vertebral arteries. No dissection, aneurysm, or hemodynamically significant stenosis utilizing NASCET criteria. 2. Patent anterior and posterior intracranial circulation. No large vessel occlusion, aneurysm, or significant stenosis. 3. Left parietal lobe subacute infarction visible on noncontrast CT with pseudonormalized perfusion. 4. No perfusion anomaly to suggest interval acute infarct. Electronically Signed   By: Kristine Garbe M.D.   On: 03/09/2018 18:44   LE venous doppler  Right: Findings consistent with acute deep vein thrombosis involving the right posterior  tibial vein. No cystic structure found in the popliteal fossa. Left: Findings consistent with acute deep vein thrombosis involving the left peroneal vein. No cystic structure found in the popliteal fossa.  TTE - Left ventricle: Systolic function was vigorous. The estimated   ejection fraction was in the range of 65% to 70%. Wall motion was   normal; there were no regional wall motion abnormalities. Doppler   parameters are consistent with abnormal left ventricular   relaxation (grade 1 diastolic dysfunction).  TCD bubble study - no PFO at rest   PHYSICAL EXAM  Temp:  [97.6 F (36.4 C)-98.5 F (36.9 C)] 97.6 F (36.4 C) (11/07 0740) Pulse Rate:  [86-111] 104 (11/07 0740) Resp:  [17-24] 23 (11/07 0740) BP: (74-128)/(51-92) 115/86 (11/07 0740) SpO2:  [95 %-100 %] 100 % (11/07 0740) Weight:  [65.1 kg] 65.1 kg (11/06 2141)  General - Well nourished, well developed, mildly lethargic  Ophthalmologic - fundi not visualized due to noncooperation.  Cardiovascular - Regular rate and rhythm.  Neuro - awake, eyes open, more alert than yesterday and interactive. Ask appropriate questions, however, only orientated  to self and place, not to age and time. Perseveration present. Follow most simple commands. Paucity of speech, mild dysarthria. PERRL, EOMI, tracking on both sides. Blinking to visual threat inconsistent bilaterally. Mild left nasolabial fold flattening and tongue protrusion midline. BUE proximal 3-/5 and distally 3/5. BLE no spontaneous movement and mild withdraw on pain stimulation. DTR diminished and right babinski positive. Sensation, coordination not cooperative and gait not tested.   ASSESSMENT/PLAN Maria Avila is a 67 y.o. female with history of DM, HTN, colitis admitted for septic shock, respiratory failure and empyema, concerning for esophageal rupture s/p EGD. After extubation, pt was found to have left gaze and right hemiplegia, aphasia. CT head showed left parietal infarct. No tPA given due to no clear LSW.    Stroke:  Left parietal small infarcts, left MCA/ACA, right parietal and pontine punctate infarcts, cardio embolic pattern, source unknown, Endocarditis vs. Hypercoagulable state due to severe sepsis  CT head left parietal acute infarcts  CTA head and neck and CTP unremarkable  MRI  02/16/18 no acute finding  MRI 58/0/99 cardioembolic infarcts including Left parietal small infarcts, left MCA/ACA, right parietal and pontine punctate infarcts  LE venous doppler b/l LE DVT  TCD bubble study no PFO at rest  Not candidate for TEE due to ? Esophagus perforation  2D Echo EF 65-70%  LDL 62  HgbA1c 5.1  Heparin IV for VTE prophylaxis  NPO now on TPN  aspirin 81 mg daily prior to admission, now on heparin IV. Consider to switch to DOACs if no further procedure planned.   Ongoing aggressive stroke risk factor management  Therapy recommendations:  Pending   Disposition:  Pending   Resp failure due to severe sepsis and empyema  CT chest showed free air and large pleural fluid  S/p chest tube  Intubated on 03/04/18 and now extubated on 03/09/18  Continue unasyn  for Pseudomonas, E coli and entococcus in pleural fluid  ? Esophageal perforation   Now NPO on TPN  Pending Gastrografin test to evaluate the esophagus  Leukocytosis WBC 12.4->14.2->17.4  BLE DVT  LE venous doppler showed BLE DVT with right posterior tibial veins and left peroneal veins distally.  Could be the source of embolic stroke if PFO present  TCD bubble study pending  On heparin IV for DVT treatment and prevention - consider switch to DOACs if no further  procedure planned  Stat CT if neuro changes to rule out bleeding  Diabetes  HgbA1c 5.1 goal < 7.0  Controlled  CBG monitoring  SSI  Hypotension Hx of hypertension . BP on the low side . Not on BP meds . Continue IVF and TPN . Treat infection  Long term BP goal normotensive  Other Stroke Risk Factors  Advanced age  Other Active Problems  Anemia due to chronic disease - Hb 8.2->8.4->8.6  Thrombocytopenia - plat 122->135->165  Elevated LFT and AKP  tachycardia  Hospital day # 7  Rosalin Hawking, MD PhD Stroke Neurology 03/11/2018 11:31 PM    To contact Stroke Continuity provider, please refer to [email or web address] After hours, contact General Neurology

## 2018-03-11 NOTE — Consult Note (Addendum)
[facility] Nurse wound consult note Reason for Consult: Consult requested for buttocks and sacrum.  Pt was noted to have pressure injuries present on admission on 10/31, but locations have declined since she is frequently incontinent of liquid stool and it is difficult to keep the affected areas from becoming soiled. Family member at bedside states he was not aware the wounds have declined.  Wound type: Sacrum with previously noted stage 3 pressure injury in nursing flowsheet; this has declined to an unstageable pressure injury; 3X3cm, 80% slough, 20% red, no odor, drainage, or fluctuance. Pt has protruding sacrum bone to this site.  Bilat buttocks with several patchy areas of full thickness skin loss; appearance and locations are consistent with moisture associated skin damage, NOT pressure injuries.  All are red, moist, weeping small amt yellow drainage, painful to touch. Pressure Injury POA: Yes Dressing procedure/placement/frequency: Air mattress ordered to decrease pressure and increase airflow to the affected areas. Foam dressings are trapping stool beneath the dressings; leave off and apply barrier cream and antifungal powder to protect and repel moisture. Santyl ointment to provide enzymatic debridement of nonviable tissue to sacrum, with foam dressing over the site to attempt to keep it from becoming soiled. Discussed plan of care with patient's family member at the bedside. Please re-consult if further assistance is needed.  Thank-you,  Julien Girt MSN, [place], [place], [place], [employer]

## 2018-03-11 NOTE — Progress Notes (Signed)
ANTICOAGULATION CONSULT NOTE  Pharmacy Consult:  Heparin Indication: DVT  Allergies  Allergen Reactions  . Crestor [Rosuvastatin Calcium] Swelling and Other (See Comments)    Swelling of legs and muscle weakness  . Zocor [Simvastatin] Swelling and Other (See Comments)    Swelling of legs and muscle weakness    Patient Measurements: Height: 5\' 2"  (157.5 cm) Weight: 143 lb 8.3 oz (65.1 kg) IBW/kg (Calculated) : 50.1 HEPARIN DW (KG): 63.4  Vital Signs: Temp: 98.1 F (36.7 C) (11/07 1414) Temp Source: Oral (11/07 1414) BP: 127/88 (11/07 1414) Pulse Rate: 104 (11/07 1414)  Labs: Recent Labs    03/09/18 0350 03/10/18 0451 03/10/18 2047 03/11/18 0500  HGB 8.2* 8.4*  --  8.6*  HCT 24.6* 26.0*  --  25.9*  PLT 122* 135*  --  165  HEPARINUNFRC  --   --  0.58 0.49  CREATININE 0.66 0.42*  --  0.40*    Estimated Creatinine Clearance: 59.6 mL/min (A) (by C-G formula based on SCr of 0.4 mg/dL (L)).    Assessment: Maria Avila is a 35y old female presented with altered mental status from SNF, found to have empyema. Pharmacy now consulted to dose heparin in setting of DVT. 11/6 MRI: Multifocal acute ischemia likely from embolic disease. Affected sites are left parietal lobe, right parietal love, and posterior left frontal lobe. Will be conservative in dosing. No bolus.  Heparin level is 0.49 at goal heparin drip 700 uts/hr.  H/h low stable. No bleeding reported.  Goal of Therapy:  Heparin level 0.3-0.5 units/mL Monitor platelets by anticoagulation protocol: Yes   Plan:  Continue heparin gtt to 700 units/hr Daily HL, CBC  Bonnita Nasuti Pharm.D. CPP, BCPS Clinical Pharmacist [phone] 03/11/2018 2:22 PM

## 2018-03-11 NOTE — Progress Notes (Signed)
  Speech Language Pathology Treatment: Dysphagia  Patient Details Name: Maria Avila MRN: [id number] DOB: January 02, 1951 Today's Date: 03/11/2018 Time: 7096-2836 SLP Time Calculation (min) (ACUTE ONLY): 23 min  Assessment / Plan / Recommendation Clinical Impression  Pt was alert and attentive to PO trials of ice chips and thin H2O, requiring only min verbal cues from the clinician. Although anterior loss was observed, no overt s/s aspiration demonstrated. Given current medical fragility and risk factors including prolonged intubation and decreased respiratory status, pt still presents a mild-moderate risk for aspiration and will benefit from objective swallow test to fully assess integrity of swallow function once cleared from GI. Consulted xray department who suggested esophagram with water-soluable contrast during esophagam would be safest to rule out perforation. MBS can be performed if perforation not present. Initially spoke with MD and texted/communicated recommendations. ST recommends continue NPO except a few sips of H2O after oral care with FULL supervision/assist until MBSS can be completed.    HPI HPI: Pt is a 67 year old admitted with respiratory failure, left lung empyema s/p CT placement 10/31, and septic shock. ETT 10/31-11/4. MRI 11/6 showed multifocal, bilateral acute ischemia with largest infarction in the L parietal lobe. Additional areas include the L frontal lobe, R parietal lobe, and pons. PMH: malnutrition and unexplained FTT (s/p recent GI and psych consults), HTN, DM      SLP Plan  Continue with current plan of care       Recommendations  Diet recommendations: NPO;Other(comment)(may have 1-3 sips of thin H2O only until MBS) Liquids provided via: Cup;No straw Medication Administration: Via alternative means Compensations: Slow rate;Small sips/bites Postural Changes and/or Swallow Maneuvers: Seated upright 90 degrees                Oral Care Recommendations: Oral  care QID Follow up Recommendations: Skilled Nursing facility SLP Visit Diagnosis: Dysphagia, unspecified (R13.10) Plan: Continue with current plan of care       Jettie Booze, Student SLP                 Jettie Booze 03/11/2018, 4:15 PM

## 2018-03-11 NOTE — Progress Notes (Signed)
PHARMACY - ADULT TOTAL PARENTERAL NUTRITION CONSULT NOTE   Pharmacy Consult:  TPN Indication:  Possible esophageal perforation  Patient Measurements: Height: _0  (157.5 cm) Weight: 143 lb 8.3 oz (65.1 kg) IBW/kg (Calculated) : 50.1 TPN AdjBW (KG): 53.8 Body mass index is 26.25 kg/m.  Assessment:  5 YOF with moderate malnutrition presented on 03/04/18 with AMS. Patient was recently hospitalized for abdominal pain, failure to thrive and inability to tolerate an oral diet. Now concerned with esophageal perforation, so Pharmacy consulted to manage TPN.  Per documentation, patient has been eating minimally for the past several months and weight decreased from 64.9 to 58.2 kg, now up with fluid resuscitation.  11/6 - transferred out of ICU  GI: hx colitis/gastritis/FTT.  Prealbumin remains low at < 5, NG O/P 318m. PPI IV. SLP 11/6 recommends NPO, at some point needs Gastrografin test to evaluate esophagus Endo: DM (no PTA med) - CBGs < 180 (stress steroids d/c'd). Running low in 70-80s this AM Insulin requirements in the past 24 hours: 4 units SSI Lytes: Pt with refeeding this admit - Mag down to 1.7, others stable WNL Renal: SCr down to 0.4 (BL SCr 0.6), BUN 14 - UOP not charted accurately, NS at 75 ml/hr, net +14.8L Neuro: New acute/subacute stroke 11/5 - started on asa supp, statin when able Pulm: extubated 11/4 > [place]. CT O/P down to 329mCards: HTN hx - BP low-nl, still tachy, remains off pressors. Permissive HTN s/p stroke 11/5 AC: New bilateral DVT - started on heparin Hepatobil: LFTs normalized, alk phos down to 326, tbili nl, INR down to 1.45, TG WNL ID: [email or web address] for Enterococcal and Ecoli/pseudomonas (not treating since clinically better per MD) empyema - afeb, WBC up to 17.4 TPN Access: CVC triple lumen placed 03/04/18 TPN start date: 03/06/18  Nutritional Goals (*per updated RD rec on 03/09/18*): 1700-1900 kCal, 95-110gm protein, >/=1.7L fluid per day  Current Nutrition:   TPN NPO  Plan:  Add lipids today with patient transferred out ot the ICU. Advance TPN to 75 ml/hr. Monitor for refeeding closely TPN will provide 106g AA, 216g CHO, and 56g lipids for a total of 1717 kCal, meeting 100% of patient needs Electrolytes in TPN: Increase Mag; Continue increased Phos and K; Cl:Ac 1:2 Daily multivitamin and trace elements in TPN  Continue moderate SSI at q6hr and monitor CBGs F/U AM labs and Gastrografin study as able  HaElicia LampPharmD, BCPS Clinical Pharmacist Clinical phone [phone] check AMION for all [facility] numbers 03/11/2018 8:34 AM

## 2018-03-11 NOTE — Progress Notes (Signed)
PROGRESS NOTE                                                                                                                                                                                                             Patient Demographics:    Maria Avila, is a 67 y.o. female, DOB - 08/11/1950, WCB:[id number]  Admit date - 03/04/2018   Admitting Physician [name], MD  Outpatient Primary MD for the patient is [name], MD  LOS - 7   Chief Complaint  Patient presents with  . Altered Mental Status       Brief Narrative    67 y.o. female with history of malnutrition, hypertension, diabetes and colitis , was admitted to ICU 03/04/2018 due to septic shock from empyema, patient with recent hospitalization letter to that significant for endoscopy with no acute findings, she remained in SNF for 4 days, then she presents with septic shock, work-up significant for empyema, requiring pressors, intubation, became off pressors, extubated, then she was noted to have moving her right side, and had left gaze preference, work-up significant for acute CVA, embolic, as well she had acute DVT, she was transferred to [facility] care 03/11/2018.  10/31 ETT placed 10/31 Chest tube placed 11/1 TPA/pulmozyme administration  11/4 extubated   Subjective:    Maria Avila today denies any complaints, she pulled her NG tube, slightly confused but pleasant and conversant .   Assessment  & Plan :    Active Problems:   Severe protein-calorie malnutrition (HCC)   Empyema (HCC)   Pressure injury of skin   Acute respiratory failure with hypercapnia (HCC)   Malnutrition of moderate degree   Cerebral embolism with cerebral infarction  Septic shock due to empyema -Present on admission, requiring pressure support, CT chest showing free air and large pleural effusion, status post chest tube 07/03/2017,. - intubated on admission 03/04/2018, extubated  03/09/2018 -Edema culture showing Pseudomonas, E. coli and enterococcus and pleural fluid, continue with Unasyn. -CT surgery input greatly appreciated, chest tube care per CT surgery -There was question about esophageal perforation, felt unlikely by CT surgery, but recommendation is for Gastrografin study to rule out perforation, or study could not be done as she did not pass her swallow evaluation.  Acute CVA - Left parietal small infarcts, left MCA/ACA, right parietal and pontine punctate  infarcts, -Most likely embolic pattern, cardioembolic versus DVT in the setting of PFO, versus endocarditis, versus hypercoagulable state due to severe sepsis. -Currently on heparin for her lower extremity DVT, follow on TCD bubble study, follow on 2D echo -Treatment per neurology -Still on TPN as she did not pass her swallow evaluation.  Acute respiratory failure -Due to empyema and septic shock on admission, requiring intubation, currently extubated, on oxygen via nasal cannula, wean as tolerated,.  Bilateral lower extremity DVT -Lower extremity venous Doppler showing bilateral lower extremity DVT with right posterior tibial veins and left peroneal veins distally -Follow on TCD bubble study, as it could be source of embolic stroke if PFO is present -Continue with heparin GTT for now.  Diabetes -Sliding scale, hemoglobin A1c is 5.1  Protein calorie malnutrition -Currently on TPN, did not pass swallow study yet  Code Status : partial  Family Communication  : D/W husband at bedside  Disposition Plan  : not Stable for discharge at  Consults  : PCCM, CT surgery, neuurology  Procedures  :  10/31 ETT placed 10/31 Chest tube placed 11/1 TPA/pulmozyme administration  11/4 extubated  DVT Prophylaxis  :  Heparin GTT  Lab Results  Component Value Date   PLT 165 03/11/2018    Antibiotics  :    Anti-infectives (From admission, onward)   Start     Dose/Rate Route Frequency Ordered Stop    03/07/18 1000  Ampicillin-Sulbactam (UNASYN) 3 g in sodium chloride 0.9 % 100 mL IVPB     3 g 200 mL/hr over 30 Minutes Intravenous Every 8 hours 03/07/18 0948     03/05/18 1200  vancomycin (VANCOCIN) 500 mg in sodium chloride 0.9 % 100 mL IVPB  Status:  Discontinued     500 mg 100 mL/hr over 60 Minutes Intravenous Every 24 hours 03/04/18 1801 03/07/18 0948   03/04/18 2200  piperacillin-tazobactam (ZOSYN) IVPB 3.375 g  Status:  Discontinued     3.375 g 12.5 mL/hr over 240 Minutes Intravenous Every 8 hours 03/04/18 1801 03/07/18 0948   03/04/18 0915  ceFEPIme (MAXIPIME) 2 g in sodium chloride 0.9 % 100 mL IVPB     2 g 200 mL/hr over 30 Minutes Intravenous  Once 03/04/18 0910 03/04/18 1000   03/04/18 0915  metroNIDAZOLE (FLAGYL) IVPB 500 mg  Status:  Discontinued     500 mg 100 mL/hr over 60 Minutes Intravenous Every 8 hours 03/04/18 0910 03/04/18 1801   03/04/18 0915  vancomycin (VANCOCIN) IVPB 1000 mg/200 mL premix     1,000 mg 200 mL/hr over 60 Minutes Intravenous  Once 03/04/18 0910 03/04/18 1338        Objective:   Vitals:   03/10/18 2141 03/11/18 0015 03/11/18 0353 03/11/18 0740  BP: 128/87 120/86 125/90 115/86  Pulse:  (!) 105 (!) 107 (!) 104  Resp: (!) 24 (!) 22 20 (!) 23  Temp: 97.6 F (36.4 C) 98.1 F (36.7 C) 98.3 F (36.8 C) 97.6 F (36.4 C)  TempSrc: Oral Oral Oral Oral  SpO2: 100% 100% 100% 100%  Weight: 65.1 kg     Height: 5\' 2"  (1.575 m)       Wt Readings from Last 3 Encounters:  03/10/18 65.1 kg  02/16/18 48.7 kg  02/05/18 48.1 kg     Intake/Output Summary (Last 24 hours) at 03/11/2018 1256 Last data filed at 03/10/2018 2100 Gross per 24 hour  Intake 2144.43 ml  Output 310 ml  Net 1834.43 ml     Physical Exam  Awake Alert, pleasant, answering questions   Symmetrical Chest wall movement, Good air movement bilaterally, some rails of the left lung, no use of accessory muscles RRR,No Gallops,Rubs or new Murmurs, No Parasternal Heave +ve B.Sounds,  Abd Soft, No tenderness,No rebound - guarding or rigidity. No Cyanosis, Clubbing, +1 edema bilaterally, has right-sided weakness  Patient  with left subclavian line, upper extremity midline, and left side chest tube    Data Review:    CBC Recent Labs  Lab 03/06/18 0408 03/07/18 0352 03/08/18 0414 03/09/18 0350 03/10/18 0451 03/11/18 0500  WBC 20.1* 16.9* 13.1* 12.4* 14.2* 17.4*  HGB 9.6* 8.6* 8.5* 8.2* 8.4* 8.6*  HCT 28.8* 24.7* 24.2* 24.6* 26.0* 25.9*  PLT 183 121* 102* 122* 135* 165  MCV 90.3 88.5 88.3 90.1 90.9 92.8  MCH 30.1 30.8 31.0 30.0 29.4 30.8  MCHC 33.3 34.8 35.1 33.3 32.3 33.2  RDW 15.5 15.6* 15.6* 15.6* 15.6* 16.1*  LYMPHSABS 2.4 1.4 1.9 0.9 1.7  --   MONOABS 0.8 1.0 0.7 0.1 0.6  --   EOSABS 0.0 0.0 0.0 0.1 0.0  --   BASOSABS 0.1 0.0 0.0 0.0 0.0  --     Chemistries  Recent Labs  Lab 03/06/18 0408 03/07/18 0352 03/08/18 0414 03/08/18 1846 03/08/18 2021 03/09/18 0350 03/10/18 0451 03/11/18 0500  NA 137 137 143 141 140 143 139 140  K 3.1* 3.5 2.6* 5.8* 5.6* 3.1* 3.7 3.9  CL 115* 117* 117* 116* 113* 115* 108 110  CO2 17* 17* 21* 20* 24 24 28 25   GLUCOSE 121* 180* 149* 107* 117* 123* 125* 80  BUN 33* 30* 24* 23 25* 19 17 14   CREATININE 1.49* 1.25* 0.93 0.75 0.77 0.66 0.42* 0.40*  CALCIUM 7.9* 8.1* 8.2* 8.0* 8.0* 8.1* 7.8* 7.7*  MG 1.9 2.0 1.7  --   --  2.0  --  1.7  AST 29 51* 56*  --   --  55* 49* 34  ALT 25 37 47*  --   --  50* 49* 36  ALKPHOS 203* 341* 380*  --   --  475* 408* 326*  BILITOT 1.7* 1.3* 0.8  --   --  0.8 0.7 0.5   ------------------------------------------------------------------------------------------------------------------ Recent Labs    03/10/18 0451  CHOL 93  HDL 15*  LDLCALC 62  TRIG 81  CHOLHDL 6.2    Lab Results  Component Value Date   HGBA1C 5.1 03/10/2018   ------------------------------------------------------------------------------------------------------------------ No results for input(s): TSH, T4TOTAL,  T3FREE, THYROIDAB in the last 72 hours.  Invalid input(s): FREET3 ------------------------------------------------------------------------------------------------------------------ No results for input(s): VITAMINB12, FOLATE, FERRITIN, TIBC, IRON, RETICCTPCT in the last 72 hours.  Coagulation profile Recent Labs  Lab 03/04/18 1742 03/05/18 0944 03/08/18 0414  INR 1.95 2.08 1.45    No results for input(s): DDIMER in the last 72 hours.  Cardiac Enzymes No results for input(s): CKMB, TROPONINI, MYOGLOBIN in the last 168 hours.  Invalid input(s): CK ------------------------------------------------------------------------------------------------------------------ No results found for: BNP  Inpatient Medications  Scheduled Meds: . chlorhexidine  15 mL Mouth Rinse BID  . Chlorhexidine Gluconate Cloth  6 each Topical Daily  . collagenase   Topical Daily  . Gerhardt's butt cream   Topical Daily  . insulin aspart  0-15 Units Subcutaneous Q6H  . mouth rinse  15 mL Mouth Rinse q12n4p  . pantoprazole (PROTONIX) IV  40 mg Intravenous Q24H  . sodium chloride flush  10-40 mL Intracatheter Q12H   Continuous Infusions: . sodium chloride Stopped (03/04/18 2139)  . sodium  chloride 75 mL/hr at 03/10/18 2215  . ampicillin-sulbactam (UNASYN) IV 3 g (03/11/18 0829)  . heparin 700 Units/hr (03/10/18 2241)  . TPN ADULT (ION) 70 mL/hr at 03/10/18 2100  . TPN ADULT (ION)     PRN Meds:.bisacodyl, sodium chloride flush  Micro Results Recent Results (from the past 240 hour(s))  Blood Culture (routine x 2)     Status: None   Collection Time: 03/04/18  9:10 AM  Result Value Ref Range Status   Specimen Description   Final    BLOOD LEFT HAND Performed at [facility] [address], [place], [place] [address]    Special Requests   Final    BOTTLES DRAWN AEROBIC AND ANAEROBIC Blood Culture results may not be optimal due to an inadequate volume of blood received in culture  bottles Performed at [facility] [address], [place], [place] [address]    Culture   Final    NO GROWTH 5 DAYS Performed at [facility], [place] [address], [place], [place] [address]    Report Status 03/09/2018 FINAL  Final  MRSA PCR Screening     Status: None   Collection Time: 03/04/18  2:50 PM  Result Value Ref Range Status   MRSA by PCR NEGATIVE NEGATIVE Final    Comment:        The GeneXpert MRSA Assay (FDA approved for NASAL specimens only), is one component of a comprehensive MRSA colonization surveillance program. It is not intended to diagnose MRSA infection nor to guide or monitor treatment for MRSA infections. Performed at [facility], [place] [address] [place], [place] [address]   Culture, respiratory (tracheal aspirate)     Status: None   Collection Time: 03/04/18  3:50 PM  Result Value Ref Range Status   Specimen Description TRACHEAL ASPIRATE  Final   Special Requests Normal  Final   Gram Stain   Final    ABUNDANT WBC PRESENT, PREDOMINANTLY PMN ABUNDANT GRAM POSITIVE COCCI FEW GRAM POSITIVE RODS FEW GRAM NEGATIVE RODS Performed at [facility], [place] [address], [place], [place] [address]    Culture FEW MULTIPLE ORGANISMS PRESENT, NONE PREDOMINANT  Final   Report Status 03/07/2018 FINAL  Final  Body fluid culture     Status: None   Collection Time: 03/04/18  4:32 PM  Result Value Ref Range Status   Specimen Description FLUID PLEURAL  Final   Special Requests NONE  Final   Gram Stain   Final    ABUNDANT WBC PRESENT, PREDOMINANTLY PMN MODERATE GRAM POSITIVE COCCI IN PAIRS IN CLUSTERS FEW GRAM POSITIVE RODS FEW GRAM NEGATIVE RODS Performed at [facility], [place] [address], [place], [place] [address]    Culture   Final    RARE ESCHERICHIA COLI FEW ENTEROCOCCUS AVIUM RARE PSEUDOMONAS AERUGINOSA    Report Status 03/09/2018 FINAL  Final   Organism ID, Bacteria ESCHERICHIA COLI  Final   Organism ID, Bacteria  ENTEROCOCCUS AVIUM  Final   Organism ID, Bacteria PSEUDOMONAS AERUGINOSA  Final      Susceptibility   Enterococcus avium - MIC*    AMPICILLIN <=2 SENSITIVE Sensitive     VANCOMYCIN <=0.5 SENSITIVE Sensitive     GENTAMICIN SYNERGY SENSITIVE Sensitive     * FEW ENTEROCOCCUS AVIUM   Escherichia coli - MIC*    AMPICILLIN >=32 RESISTANT Resistant     CEFAZOLIN <=4 SENSITIVE Sensitive     CEFEPIME <=1 SENSITIVE Sensitive  CEFTAZIDIME <=1 SENSITIVE Sensitive     CEFTRIAXONE <=1 SENSITIVE Sensitive     CIPROFLOXACIN <=0.25 SENSITIVE Sensitive     GENTAMICIN <=1 SENSITIVE Sensitive     IMIPENEM <=0.25 SENSITIVE Sensitive     TRIMETH/SULFA <=20 SENSITIVE Sensitive     AMPICILLIN/SULBACTAM 8 SENSITIVE Sensitive     PIP/TAZO <=4 SENSITIVE Sensitive     Extended ESBL NEGATIVE Sensitive     * RARE ESCHERICHIA COLI   Pseudomonas aeruginosa - MIC*    CEFTAZIDIME 4 SENSITIVE Sensitive     CIPROFLOXACIN <=0.25 SENSITIVE Sensitive     GENTAMICIN <=1 SENSITIVE Sensitive     IMIPENEM <=0.25 SENSITIVE Sensitive     PIP/TAZO 8 SENSITIVE Sensitive     CEFEPIME 2 SENSITIVE Sensitive     * RARE PSEUDOMONAS AERUGINOSA  Culture, blood (routine x 2)     Status: None   Collection Time: 03/04/18  5:42 PM  Result Value Ref Range Status   Specimen Description BLOOD SITE NOT SPECIFIED  Final   Special Requests   Final    BOTTLES DRAWN AEROBIC AND ANAEROBIC Blood Culture adequate volume   Culture   Final    NO GROWTH 5 DAYS Performed at [facility], [address] [place], [place] [address]    Report Status 03/09/2018 FINAL  Final  Culture, blood (routine x 2)     Status: None   Collection Time: 03/04/18  5:42 PM  Result Value Ref Range Status   Specimen Description BLOOD SITE NOT SPECIFIED  Final   Special Requests   Final    BOTTLES DRAWN AEROBIC AND ANAEROBIC Blood Culture adequate volume   Culture   Final    NO GROWTH 5 DAYS Performed at [facility], [address]  [place], [place] [address]    Report Status 03/09/2018 FINAL  Final  Urine culture     Status: Abnormal   Collection Time: 03/04/18  6:47 PM  Result Value Ref Range Status   Specimen Description URINE, CATHETERIZED  Final   Special Requests   Final    Normal Performed at [facility], [place] [address], [place], [place] [address]    Culture MULTIPLE SPECIES PRESENT, SUGGEST RECOLLECTION (A)  Final   Report Status 03/05/2018 FINAL  Final    Radiology Reports Ct Angio Head W Or Wo Contrast  Result Date: 03/09/2018 CLINICAL DATA:  67 y/o F; patient extubated yesterday. Right-sided weakness and slow speech. Increased lethargy. EXAM: CT ANGIOGRAPHY HEAD AND NECK CT PERFUSION BRAIN TECHNIQUE: Multidetector CT imaging of the head and neck was performed using the standard protocol during bolus administration of intravenous contrast. Multiplanar CT image reconstructions and MIPs were obtained to evaluate the vascular anatomy. Carotid stenosis measurements (when applicable) are obtained utilizing NASCET criteria, using the distal internal carotid diameter as the denominator. Multiphase CT imaging of the brain was performed following IV bolus contrast injection. Subsequent parametric perfusion maps were calculated using RAPID software. CONTRAST:  90 cc Isovue 370 COMPARISON:  03/09/2018, 03/04/2018, 02/16/2018 CT head. 02/16/2018 MRI head. FINDINGS: CTA NECK FINDINGS Aortic arch: Standard branching. Imaged portion shows no evidence of aneurysm or dissection. No significant stenosis of the major arch vessel origins. Mild calcific aortic atherosclerosis. Right carotid system: No evidence of dissection, stenosis (50% or greater) or occlusion. Mild non stenotic calcific atherosclerosis of the carotid bifurcation. Left carotid system: No evidence of dissection, stenosis (50% or greater) or occlusion. Minimal non stenotic calcific atherosclerosis of the carotid bifurcation. Vertebral  arteries: Left dominant,  diminutive right vertebral artery. No evidence of dissection, stenosis (50% or greater) or occlusion. Skeleton: Moderate spondylosis of the cervical spine with multilevel disc and facet degenerative changes. No high-grade bony canal stenosis. Other neck: Negative. Upper chest: Negative. Review of the MIP images confirms the above findings CTA HEAD FINDINGS Anterior circulation: No significant stenosis, proximal occlusion, aneurysm, or vascular malformation. Mild non stenotic calcific atherosclerosis of the carotid siphons. Posterior circulation: No significant stenosis, proximal occlusion, aneurysm, or vascular malformation. Venous sinuses: As permitted by contrast timing, patent. Anatomic variants: Right vertebral artery largely terminates in the right PICA with diminutive contribution to the basilar. Complete circle-of-Willis. Large left A1, large anterior communicating artery, diminutive right A1, normal variant Review of the MIP images confirms the above findings CT Brain Perfusion Findings: CBF (<30%) Volume: 29mL Perfusion (Tmax>6.0s) volume: 58mL Mismatch Volume: 34mL Infarction Location:Left parietal lobe subacute infarction visible on noncontrast CT, pseudonormalized perfusion. IMPRESSION: 1. Patent carotid and vertebral arteries. No dissection, aneurysm, or hemodynamically significant stenosis utilizing NASCET criteria. 2. Patent anterior and posterior intracranial circulation. No large vessel occlusion, aneurysm, or significant stenosis. 3. Left parietal lobe subacute infarction visible on noncontrast CT with pseudonormalized perfusion. 4. No perfusion anomaly to suggest interval acute infarct. Electronically Signed   By: Kristine Garbe M.D.   On: 03/09/2018 18:44   Ct Head Wo Contrast  Result Date: 03/09/2018 CLINICAL DATA:  Residual right-sided weakness. EXAM: CT HEAD WITHOUT CONTRAST TECHNIQUE: Contiguous axial images were obtained from the base of the skull through the vertex without  intravenous contrast. COMPARISON:  03/04/2018 CT.  02/16/2018 MRI. FINDINGS: Brain: There is acute to subacute infarction in the left parietal lobe consistent with left MCA branch vessel infarction. The brain shows low-density with mild swelling. No hemorrhage. No other abnormal brain finding. No mass, hydrocephalus or extra-axial collection. Vascular: There is atherosclerotic calcification of the major vessels at the base of the brain. Skull: Negative Sinuses/Orbits: Clear/normal Other: None IMPRESSION: Acute/subacute infarction in the left parietal lobe consistent with MCA branch vessel infarction. No evidence of hemorrhage or mass effect. Electronically Signed   By: Nelson Chimes M.D.   On: 03/09/2018 13:31   Ct Head Wo Contrast  Result Date: 03/04/2018 CLINICAL DATA:  Altered level of consciousness EXAM: CT HEAD WITHOUT CONTRAST TECHNIQUE: Contiguous axial images were obtained from the base of the skull through the vertex without intravenous contrast. COMPARISON:  02/16/2018 FINDINGS: Brain: No evidence of acute infarction, hemorrhage, hydrocephalus, extra-axial collection or mass lesion/mass effect. Vascular: No hyperdense vessel or unexpected calcification. Skull: Normal. Negative for fracture or focal lesion. Sinuses/Orbits: No acute finding. Other: None. IMPRESSION: No acute abnormality noted.  No change from the prior exam. Electronically Signed   By: Inez Catalina M.D.   On: 03/04/2018 09:33   Ct Head Wo Contrast  Result Date: 02/16/2018 CLINICAL DATA:  Generalize weakness and slurred speech for 2 weeks. EXAM: CT HEAD WITHOUT CONTRAST TECHNIQUE: Contiguous axial images were obtained from the base of the skull through the vertex without intravenous contrast. COMPARISON:  04/01/2005 FINDINGS: Brain: No evidence of acute infarction, hemorrhage, hydrocephalus, extra-axial collection or mass lesion/mass effect. Vascular: Mild intracranial arterial vascular calcifications. Skull: Calvarium appears  intact. Sinuses/Orbits: Paranasal sinuses and mastoid air cells are clear. Other: None. IMPRESSION: No acute intracranial abnormality. Electronically Signed   By: Lucienne Capers M.D.   On: 02/16/2018 06:24   Ct Angio Neck W Or Wo Contrast  Result Date: 03/09/2018 CLINICAL DATA:  67 y/o F; patient  extubated yesterday. Right-sided weakness and slow speech. Increased lethargy. EXAM: CT ANGIOGRAPHY HEAD AND NECK CT PERFUSION BRAIN TECHNIQUE: Multidetector CT imaging of the head and neck was performed using the standard protocol during bolus administration of intravenous contrast. Multiplanar CT image reconstructions and MIPs were obtained to evaluate the vascular anatomy. Carotid stenosis measurements (when applicable) are obtained utilizing NASCET criteria, using the distal internal carotid diameter as the denominator. Multiphase CT imaging of the brain was performed following IV bolus contrast injection. Subsequent parametric perfusion maps were calculated using RAPID software. CONTRAST:  90 cc Isovue 370 COMPARISON:  03/09/2018, 03/04/2018, 02/16/2018 CT head. 02/16/2018 MRI head. FINDINGS: CTA NECK FINDINGS Aortic arch: Standard branching. Imaged portion shows no evidence of aneurysm or dissection. No significant stenosis of the major arch vessel origins. Mild calcific aortic atherosclerosis. Right carotid system: No evidence of dissection, stenosis (50% or greater) or occlusion. Mild non stenotic calcific atherosclerosis of the carotid bifurcation. Left carotid system: No evidence of dissection, stenosis (50% or greater) or occlusion. Minimal non stenotic calcific atherosclerosis of the carotid bifurcation. Vertebral arteries: Left dominant, diminutive right vertebral artery. No evidence of dissection, stenosis (50% or greater) or occlusion. Skeleton: Moderate spondylosis of the cervical spine with multilevel disc and facet degenerative changes. No high-grade bony canal stenosis. Other neck: Negative. Upper  chest: Negative. Review of the MIP images confirms the above findings CTA HEAD FINDINGS Anterior circulation: No significant stenosis, proximal occlusion, aneurysm, or vascular malformation. Mild non stenotic calcific atherosclerosis of the carotid siphons. Posterior circulation: No significant stenosis, proximal occlusion, aneurysm, or vascular malformation. Venous sinuses: As permitted by contrast timing, patent. Anatomic variants: Right vertebral artery largely terminates in the right PICA with diminutive contribution to the basilar. Complete circle-of-Willis. Large left A1, large anterior communicating artery, diminutive right A1, normal variant Review of the MIP images confirms the above findings CT Brain Perfusion Findings: CBF (<30%) Volume: 74mL Perfusion (Tmax>6.0s) volume: 29mL Mismatch Volume: 20mL Infarction Location:Left parietal lobe subacute infarction visible on noncontrast CT, pseudonormalized perfusion. IMPRESSION: 1. Patent carotid and vertebral arteries. No dissection, aneurysm, or hemodynamically significant stenosis utilizing NASCET criteria. 2. Patent anterior and posterior intracranial circulation. No large vessel occlusion, aneurysm, or significant stenosis. 3. Left parietal lobe subacute infarction visible on noncontrast CT with pseudonormalized perfusion. 4. No perfusion anomaly to suggest interval acute infarct. Electronically Signed   By: Kristine Garbe M.D.   On: 03/09/2018 18:44   Ct Chest Wo Contrast  Result Date: 03/04/2018 CLINICAL DATA:  Shortness of breath, pneumonia EXAM: CT CHEST WITHOUT CONTRAST TECHNIQUE: Multidetector CT imaging of the chest was performed following the standard protocol without IV contrast. COMPARISON:  Chest x-ray 03/04/2018 FINDINGS: Cardiovascular: Heart is normal size. Aorta is normal caliber with scattered aortic calcifications. Moderate coronary artery calcifications in the left anterior descending coronary artery. Mediastinum/Nodes:  Reactive borderline sized mediastinal lymph nodes. Lungs/Pleura: Complex large left pleural effusions with loculation and pleural gas. Cannot exclude bronchopleural fistula or empyema. Collapse of much of the left lung with only a small amount of aerated left upper lobe. Nodular airspace opacities in the right upper lobe and superior segment of the right lower lobe with trace right pleural effusion. Upper Abdomen: Imaging into the upper abdomen shows no acute findings. Musculoskeletal: Chest wall soft tissues are unremarkable. Degenerative changes in the thoracic spine. No acute bony abnormality. IMPRESSION: Large complex left pleural effusion with pleural gas and extensive loculations. This could reflect bronchopleural fistula or empyema. Collapse of much of the left lung with only  a small amount of aerated left upper lobe. Nodular ground-glass airspace opacities centrally in the right upper lobe and in the superior segment of the right lower lobe, likely infectious/inflammatory. Coronary artery disease, aortic atherosclerosis. Electronically Signed   By: Rolm Baptise M.D.   On: 03/04/2018 11:39   Mr Brain Wo Contrast  Result Date: 03/10/2018 CLINICAL DATA:  Stroke follow-up EXAM: MRI HEAD WITHOUT CONTRAST TECHNIQUE: Multiplanar, multiecho pulse sequences of the brain and surrounding structures were obtained without intravenous contrast. COMPARISON:  Brain MRI 02/16/2018 FINDINGS: BRAIN: There are multiple foci of abnormal diffusion restriction, the largest of which is located in the left parietal lobe. Other foci are located in the posterior left frontal lobe, right parietal lobe and the pons. There is no midline shift or mass effect. Mild cytotoxic edema of the left parietal lobe. Minimal white matter hyperintensity for age. Brain parenchyma is otherwise normal. The cerebral and cerebellar volume are age-appropriate. Susceptibility-sensitive sequences show no chronic microhemorrhage or superficial siderosis.  VASCULAR: Major intracranial arterial and venous sinus flow voids are normal. SKULL AND UPPER CERVICAL SPINE: Calvarial bone marrow signal is normal. There is no skull base mass. Visualized upper cervical spine and soft tissues are normal. SINUSES/ORBITS: No fluid levels or advanced mucosal thickening. No mastoid or middle ear effusion. The orbits are normal. IMPRESSION: 1. Multifocal acute ischemia, spread throughout multiple bilateral vascular territories. 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 2. The largest infarction is in the left parietal lobe. Other sites include the posterior left frontal lobe, right parietal lobe and pons. 3. No hemorrhage or mass effect. Electronically Signed   By: Ulyses Jarred M.D.   On: 03/10/2018 06:17   Mr Brain Wo Contrast  Result Date: 02/16/2018 CLINICAL DATA:  Weight loss and weakness. EXAM: MRI HEAD WITHOUT CONTRAST TECHNIQUE: Multiplanar, multiecho pulse sequences of the brain and surrounding structures were obtained without intravenous contrast. COMPARISON:  CT same day FINDINGS: Brain: Generalized atrophy. Diffusion imaging does not show any acute or subacute infarction. The brainstem and cerebellum are normal. Mild chronic small-vessel ischemic change of the cerebral hemispheric deep white matter. No cortical or large vessel territory infarction. No mass lesion, hemorrhage, hydrocephalus or extra-axial collection. Vascular: Major vessels at the base of the brain show flow. Skull and upper cervical spine: Negative Sinuses/Orbits: Clear/normal Other: None IMPRESSION: No acute or reversible finding. Generalized atrophy. Mild chronic small-vessel ischemic change of the cerebral hemispheric white matter. Electronically Signed   By: Nelson Chimes M.D.   On: 02/16/2018 21:52   Nm Hepato W/eject Fract  Result Date: 02/18/2018 CLINICAL DATA:  Weight loss. EXAM: NUCLEAR MEDICINE HEPATOBILIARY IMAGING WITH GALLBLADDER EF TECHNIQUE: Sequential images of the abdomen  were obtained out to 60 minutes following intravenous administration of radiopharmaceutical. After oral ingestion of Ensure, gallbladder ejection fraction was determined. At 60 min, normal ejection fraction is greater than 33%. RADIOPHARMACEUTICALS:  5.3 mCi Tc-8m  Choletec IV COMPARISON:  None. FINDINGS: Prompt uptake and biliary excretion of activity by the liver is seen. Gallbladder activity is visualized, consistent with patency of cystic duct. Biliary activity passes into small bowel, consistent with patent common bile duct. The gallbladder emptied spontaneously before the Ensure. As a result, an ejection fraction cannot be accurately calculated. That being said, the gallbladder is not visualized after ensure and gallbladder emptying is near-complete. IMPRESSION: The gallbladder emptied spontaneously before consumption of Ensure. While an ejection fraction cannot be calculated, gallbladder emptying is near complete. Electronically Signed   By: Dorise Bullion  III M.D   On: 02/18/2018 13:40   Ct Cerebral Perfusion W Contrast  Result Date: 03/09/2018 CLINICAL DATA:  67 y/o F; patient extubated yesterday. Right-sided weakness and slow speech. Increased lethargy. EXAM: CT ANGIOGRAPHY HEAD AND NECK CT PERFUSION BRAIN TECHNIQUE: Multidetector CT imaging of the head and neck was performed using the standard protocol during bolus administration of intravenous contrast. Multiplanar CT image reconstructions and MIPs were obtained to evaluate the vascular anatomy. Carotid stenosis measurements (when applicable) are obtained utilizing NASCET criteria, using the distal internal carotid diameter as the denominator. Multiphase CT imaging of the brain was performed following IV bolus contrast injection. Subsequent parametric perfusion maps were calculated using RAPID software. CONTRAST:  90 cc Isovue 370 COMPARISON:  03/09/2018, 03/04/2018, 02/16/2018 CT head. 02/16/2018 MRI head. FINDINGS: CTA NECK FINDINGS Aortic arch:  Standard branching. Imaged portion shows no evidence of aneurysm or dissection. No significant stenosis of the major arch vessel origins. Mild calcific aortic atherosclerosis. Right carotid system: No evidence of dissection, stenosis (50% or greater) or occlusion. Mild non stenotic calcific atherosclerosis of the carotid bifurcation. Left carotid system: No evidence of dissection, stenosis (50% or greater) or occlusion. Minimal non stenotic calcific atherosclerosis of the carotid bifurcation. Vertebral arteries: Left dominant, diminutive right vertebral artery. No evidence of dissection, stenosis (50% or greater) or occlusion. Skeleton: Moderate spondylosis of the cervical spine with multilevel disc and facet degenerative changes. No high-grade bony canal stenosis. Other neck: Negative. Upper chest: Negative. Review of the MIP images confirms the above findings CTA HEAD FINDINGS Anterior circulation: No significant stenosis, proximal occlusion, aneurysm, or vascular malformation. Mild non stenotic calcific atherosclerosis of the carotid siphons. Posterior circulation: No significant stenosis, proximal occlusion, aneurysm, or vascular malformation. Venous sinuses: As permitted by contrast timing, patent. Anatomic variants: Right vertebral artery largely terminates in the right PICA with diminutive contribution to the basilar. Complete circle-of-Willis. Large left A1, large anterior communicating artery, diminutive right A1, normal variant Review of the MIP images confirms the above findings CT Brain Perfusion Findings: CBF (<30%) Volume: 54mL Perfusion (Tmax>6.0s) volume: 53mL Mismatch Volume: 22mL Infarction Location:Left parietal lobe subacute infarction visible on noncontrast CT, pseudonormalized perfusion. IMPRESSION: 1. Patent carotid and vertebral arteries. No dissection, aneurysm, or hemodynamically significant stenosis utilizing NASCET criteria. 2. Patent anterior and posterior intracranial circulation. No large  vessel occlusion, aneurysm, or significant stenosis. 3. Left parietal lobe subacute infarction visible on noncontrast CT with pseudonormalized perfusion. 4. No perfusion anomaly to suggest interval acute infarct. Electronically Signed   By: Kristine Garbe M.D.   On: 03/09/2018 18:44   Dg Chest Port 1 View  Result Date: 03/08/2018 CLINICAL DATA:  67 year old female with empyema. Subsequent encounter. EXAM: PORTABLE CHEST 1 VIEW COMPARISON:  03/06/2018 chest x-ray and 03/04/2018 chest CT. FINDINGS: Left-sided chest tube is in place. Left chest wall subcutaneous emphysema once again noted. Questionable tiny left apical pneumothorax versus overlapping structures. Persistent consolidation left lung base. This may represent residua of empyema and underlying atelectasis or infiltrate. The gas containing component of empyema noted on recent chest x-ray and prior CT are not well delineated on the present exam. Endotracheal tube tip 3.4 cm above the carina. Left central line tip distal superior vena cava level. Nasogastric tube courses below the diaphragm. Tip is not included on the present exam. Heart size top-normal slightly enlarged.  Calcified aorta. IMPRESSION: 1. Left-sided chest tube is in place. Left chest wall subcutaneous emphysema once again noted. 2. Questionable tiny left apical pneumothorax versus overlapping structures. 3. Persistent  consolidation left lung base. This may represent residua of empyema and underlying atelectasis or infiltrate. The gas containing component of empyema noted on recent chest x-ray and prior CT are not well delineated on the present exam. Electronically Signed   By: Genia Del M.D.   On: 03/08/2018 07:18   Dg Chest Port 1 View  Result Date: 03/06/2018 CLINICAL DATA:  Empyema with [place] EXAM: PORTABLE CHEST 1 VIEW COMPARISON:  05/13/2009 FINDINGS: endotracheal tube, NG tube, and central venous line unchanged. LEFT chest tube in place with LEFT basilar atelectasis and  consolidation. Shallow pneumothorax along the lateral chest wall adjacent to the along the tract of the chest tube. Small amount subpulmonic gas additionally. Extensive subcutaneous gas along the LEFT chest wall associated with chest tube. RIGHT lung is clear. IMPRESSION: 1. Small pneumothorax in the lateral LEFT hemithorax adjacent to the chest tube. 2. LEFT basilar atelectasis and consolidation. 3. Stable support apparatus. Electronically Signed   By: Suzy Bouchard M.D.   On: 03/06/2018 08:23   Dg Chest Port 1 View  Result Date: 03/05/2018 CLINICAL DATA:  ET tube and chest tube present, respiratory failure. EXAM: PORTABLE CHEST 1 VIEW COMPARISON:  03/04/2018. FINDINGS: Support tubes and lines remain stable. LEFT chest tube redemonstrated, with slight improved basilar, apical, and lateral pneumothorax. Overall pneumothorax estimated 10% or prep slightly greater. RIGHT lung clear. RIGHT base volume loss with effusion. Cardiomediastinal silhouette unchanged. Subcutaneous air is increased. IMPRESSION: Slight decrease LEFT pneumothorax, with significant residual components throughout the hemithorax. Increased subcutaneous air. Electronically Signed   By: Staci Righter M.D.   On: 03/05/2018 09:10   Dg Chest Port 1 View  Result Date: 03/04/2018 CLINICAL DATA:  Endotracheal tube, nasogastric tube and chest tube placement. EXAM: PORTABLE CHEST 1 VIEW COMPARISON:  Chest x-ray from earlier same day. FINDINGS: LEFT-sided chest tube in place. Improved aeration of the LEFT hemithorax. Persistent pneumothorax at the LEFT lung base, moderate in size, with associated overlying airspace collapse. Small component of the pneumothorax seen at the LEFT lung apex. Dense opacity overlying the LEFT heart border is also compatible with atelectasis. RIGHT lung is clear. Heart size and mediastinal contours are within normal limits. Endotracheal tube appears well positioned with tip approximately 3 cm above the carina. Enteric  tube passes below the diaphragm. LEFT subclavian central line appears well positioned with tip at the level of the lower SVC/cavoatrial junction. IMPRESSION: 1. Support apparatus appears appropriately positioned, as detailed above. 2. Significantly improved aeration of the LEFT hemithorax status post LEFT-sided chest tube placement. Persistent pneumothorax on the LEFT, moderate in size, main component at the LEFT lung base with associated perihilar airspace collapse. Electronically Signed   By: Franki Cabot M.D.   On: 03/04/2018 17:26   Dg Chest Port 1 View  Result Date: 03/04/2018 CLINICAL DATA:  67 year old female with altered mental status, not responding. EXAM: PORTABLE CHEST 1 VIEW COMPARISON:  Portable chest 02/22/2018. FINDINGS: Portable AP upright view at 0854 hours. Near complete opacification of the left hemithorax where as only mild left lung base opacity was present 10 days ago. No shift of the mediastinum. The right lung remains clear allowing for portable technique. Visualized tracheal air column is within normal limits. No pneumothorax. Paucity bowel gas in the upper abdomen. No acute osseous abnormality identified. IMPRESSION: New subtotal opacification of the left hemithorax is nonspecific but suspicious for combination of left lung consolidation and pleural effusion. The right lung remains clear. Electronically Signed   By: Herminio Heads.D.  On: 03/04/2018 09:41   Dg Chest Port 1 View  Result Date: 02/22/2018 CLINICAL DATA:  Leukocytosis. EXAM: PORTABLE CHEST 1 VIEW COMPARISON:  None. FINDINGS: Subtle asymmetric prominence of the LEFT perihilar shadow, suspicious for perihilar pneumonia and/or lymphadenopathy. Subtle opacity at the LEFT lung base, compatible with pneumonia or mild atelectasis. RIGHT lung is clear. Osseous structures about the chest are unremarkable. IMPRESSION: 1. Suspected LEFT perihilar pneumonia and/or lymphadenopathy. Consider chest CT for confirmation. 2. Subtle  opacity at the LEFT lung base which could represent pneumonia, atelectasis and/or small pleural effusion. Electronically Signed   By: Franki Cabot M.D.   On: 02/22/2018 16:10   Vas [place] Lower Extremity Venous (dvt)  Result Date: 03/10/2018  Lower Venous Study Indications: Stroke, and embolic stroke.  Limitations: Body habitus, bandages, line and adjecent arterial cacification shadowing,severe edema and immobility. Performing Technologist: Lorina Rabon  Examination Guidelines: A complete evaluation includes B-mode imaging, spectral Doppler, color Doppler, and power Doppler as needed of all accessible portions of each vessel. Bilateral testing is considered an integral part of a complete examination. Limited examinations for reoccurring indications may be performed as noted.  Right Venous Findings: +---------+---------------+---------+-----------+----------+-------------------+          CompressibilityPhasicitySpontaneityPropertiesSummary             +---------+---------------+---------+-----------+----------+-------------------+ CFV      Full           Yes      Yes                                      +---------+---------------+---------+-----------+----------+-------------------+ SFJ      Full                                                             +---------+---------------+---------+-----------+----------+-------------------+ FV Prox  Full                                                             +---------+---------------+---------+-----------+----------+-------------------+ FV Mid   Full                                                             +---------+---------------+---------+-----------+----------+-------------------+ FV DistalFull                                                             +---------+---------------+---------+-----------+----------+-------------------+ PFV      Full                                                              +---------+---------------+---------+-----------+----------+-------------------+  POP      Full           Yes      Yes                                      +---------+---------------+---------+-----------+----------+-------------------+ PTV      None                    No                   Acute               +---------+---------------+---------+-----------+----------+-------------------+ PERO                                                  not well visualized +---------+---------------+---------+-----------+----------+-------------------+  Left Venous Findings: +---------+---------------+---------+-----------+----------+-------------------+          CompressibilityPhasicitySpontaneityPropertiesSummary             +---------+---------------+---------+-----------+----------+-------------------+ CFV      Full           Yes      Yes                                      +---------+---------------+---------+-----------+----------+-------------------+ SFJ      Full                                                             +---------+---------------+---------+-----------+----------+-------------------+ FV Prox  Full                                                             +---------+---------------+---------+-----------+----------+-------------------+ FV Mid   Full                                                             +---------+---------------+---------+-----------+----------+-------------------+ FV DistalFull                                                             +---------+---------------+---------+-----------+----------+-------------------+ PFV      Full                                                             +---------+---------------+---------+-----------+----------+-------------------+ POP  Full           Yes      Yes                                       +---------+---------------+---------+-----------+----------+-------------------+ PTV                                                   not well visualized +---------+---------------+---------+-----------+----------+-------------------+ PERO     None                    No                   Acute               +---------+---------------+---------+-----------+----------+-------------------+    Summary: Right: Findings consistent with acute deep vein thrombosis involving the right posterior tibial vein. No cystic structure found in the popliteal fossa. Left: Findings consistent with acute deep vein thrombosis involving the left peroneal vein. No cystic structure found in the popliteal fossa.  *See table(s) above for measurements and observations. Electronically signed by Curt Jews MD on 03/10/2018 at 2:11:56 PM.    Final      Phillips Climes M.D on 03/11/2018 at 12:56 PM  Between 7am to 7pm - Pager - [phone]  After 7pm go to [email or web address] - password [facility]  [facility] -  Office  [phone]

## 2018-03-12 ENCOUNTER — Inpatient Hospital Stay (HOSPITAL_COMMUNITY): Payer: Medicare Other

## 2018-03-12 ENCOUNTER — Inpatient Hospital Stay: Payer: Self-pay

## 2018-03-12 DIAGNOSIS — A4152 Sepsis due to Pseudomonas: Principal | ICD-10-CM

## 2018-03-12 LAB — GLUCOSE, CAPILLARY
GLUCOSE-CAPILLARY: 91 mg/dL (ref 70–99)
Glucose-Capillary: 104 mg/dL — ABNORMAL HIGH (ref 70–99)
Glucose-Capillary: 112 mg/dL — ABNORMAL HIGH (ref 70–99)
Glucose-Capillary: 117 mg/dL — ABNORMAL HIGH (ref 70–99)
Glucose-Capillary: 129 mg/dL — ABNORMAL HIGH (ref 70–99)

## 2018-03-12 LAB — CBC
HEMATOCRIT: 23.6 % — AB (ref 36.0–46.0)
Hemoglobin: 7.7 g/dL — ABNORMAL LOW (ref 12.0–15.0)
MCH: 30.7 pg (ref 26.0–34.0)
MCHC: 32.6 g/dL (ref 30.0–36.0)
MCV: 94 fL (ref 80.0–100.0)
Platelets: 173 10*3/uL (ref 150–400)
RBC: 2.51 MIL/uL — ABNORMAL LOW (ref 3.87–5.11)
RDW: 16.3 % — ABNORMAL HIGH (ref 11.5–15.5)
WBC: 18 10*3/uL — ABNORMAL HIGH (ref 4.0–10.5)
nRBC: 0.1 % (ref 0.0–0.2)

## 2018-03-12 LAB — MAGNESIUM: Magnesium: 1.7 mg/dL (ref 1.7–2.4)

## 2018-03-12 LAB — PHOSPHORUS: Phosphorus: 3.2 mg/dL (ref 2.5–4.6)

## 2018-03-12 LAB — BASIC METABOLIC PANEL
Anion gap: 4 — ABNORMAL LOW (ref 5–15)
BUN: 14 mg/dL (ref 8–23)
CALCIUM: 7.6 mg/dL — AB (ref 8.9–10.3)
CO2: 26 mmol/L (ref 22–32)
CREATININE: 0.39 mg/dL — AB (ref 0.44–1.00)
Chloride: 108 mmol/L (ref 98–111)
GFR calc non Af Amer: >60 mL/min (ref >60)
GLUCOSE: 106 mg/dL — AB (ref 70–99)
Potassium: 4 mmol/L (ref 3.5–5.1)
Sodium: 138 mmol/L (ref 135–145)

## 2018-03-12 LAB — PREPARE RBC (CROSSMATCH)

## 2018-03-12 LAB — HEPARIN LEVEL (UNFRACTIONATED): Heparin Unfractionated: 0.31 IU/mL (ref 0.30–0.70)

## 2018-03-12 MED ORDER — INSULIN ASPART 100 UNIT/ML ~~LOC~~ SOLN: 0.0000 [IU] | Freq: Three times a day (TID) | SUBCUTANEOUS | Status: DC

## 2018-03-12 MED ORDER — PIPERACILLIN-TAZOBACTAM 3.375 G IVPB
3.3750 g | Freq: Three times a day (TID) | INTRAVENOUS | Status: DC
  Administered 2018-03-12 – 2018-03-16 (×13): 3.375 g via INTRAVENOUS
  Filled 2018-03-12 (×13): qty 50

## 2018-03-12 MED ORDER — SODIUM CHLORIDE 0.9% IV SOLUTION: Freq: Once | INTRAVENOUS | Status: AC

## 2018-03-12 MED ORDER — IOHEXOL 300 MG/ML  SOLN
150.0000 mL | Freq: Once | INTRAMUSCULAR | Status: AC | PRN
  Administered 2018-03-12: 25 mL via ORAL

## 2018-03-12 MED ORDER — TRAVASOL 10 % IV SOLN
INTRAVENOUS | Status: DC
  Administered 2018-03-12: 18:00:00 via INTRAVENOUS
  Filled 2018-03-12: qty 1062

## 2018-03-12 NOTE — Progress Notes (Signed)
STROKE TEAM PROGRESS NOTE   SUBJECTIVE (INTERVAL HISTORY) Husband is at bedside. Pt is lying in bed, awake alert and answer questions appropriately. Husband also feels that she is improving well so far. She had CXR this am showed no findings of esophageal perforation. MBS will be done with speech.   OBJECTIVE Temp:  [98.3 F (36.8 C)-98.6 F (37 C)] 98.4 F (36.9 C) (11/08 1036) Pulse Rate:  [93-107] 102 (11/08 1200) Cardiac Rhythm: Sinus tachycardia (11/08 0700) Resp:  [14-20] 15 (11/08 1200) BP: (99-119)/(72-87) 105/78 (11/08 1200) SpO2:  [100 %] 100 % (11/08 1200)  Recent Labs  Lab 03/11/18 0629 03/11/18 1121 03/11/18 2357 03/12/18 0554 03/12/18 0830  GLUCAP 74 154* 111* 104* 112*   Recent Labs  Lab 03/07/18 0352 03/08/18 0414  03/08/18 2021 03/09/18 0350 03/10/18 0451 03/11/18 0500 03/12/18 0330  NA 137 143   < > 140 143 139 140 138  K 3.5 2.6*   < > 5.6* 3.1* 3.7 3.9 4.0  CL 117* 117*   < > 113* 115* 108 110 108  CO2 17* 21*   < > 24 24 28 25 26   GLUCOSE 180* 149*   < > 117* 123* 125* 80 106*  BUN 30* 24*   < > 25* 19 17 14 14   CREATININE 1.25* 0.93   < > 0.77 0.66 0.42* 0.40* 0.39*  CALCIUM 8.1* 8.2*   < > 8.0* 8.1* 7.8* 7.7* 7.6*  MG 2.0 1.7  --   --  2.0  --  1.7 1.7  PHOS 2.9 2.7  --   --  1.7* 2.7 2.9 3.2   < > = values in this interval not displayed.   Recent Labs  Lab 03/07/18 0352 03/08/18 0414 03/09/18 0350 03/10/18 0451 03/11/18 0500  AST 51* 56* 55* 49* 34  ALT 37 47* 50* 49* 36  ALKPHOS 341* 380* 475* 408* 326*  BILITOT 1.3* 0.8 0.8 0.7 0.5  PROT 4.5* 4.6* 4.4* 4.3* 4.5*  ALBUMIN 1.4* 1.4* 1.3* 1.3* 1.4*   Recent Labs  Lab 03/06/18 0408 03/07/18 0352 03/08/18 0414 03/09/18 0350 03/10/18 0451 03/11/18 0500 03/12/18 0330  WBC 20.1* 16.9* 13.1* 12.4* 14.2* 17.4* 18.0*  NEUTROABS 16.2* 14.5* 9.9* 11.3* 11.9*  --   --   HGB 9.6* 8.6* 8.5* 8.2* 8.4* 8.6* 7.7*  HCT 28.8* 24.7* 24.2* 24.6* 26.0* 25.9* 23.6*  MCV 90.3 88.5 88.3 90.1  90.9 92.8 94.0  PLT 183 121* 102* 122* 135* 165 173   No results for input(s): CKTOTAL, CKMB, CKMBINDEX, TROPONINI in the last 168 hours. No results for input(s): LABPROT, INR in the last 72 hours. No results for input(s): COLORURINE, LABSPEC, [place], GLUCOSEU, HGBUR, BILIRUBINUR, KETONESUR, PROTEINUR, UROBILINOGEN, NITRITE, LEUKOCYTESUR in the last 72 hours.  Invalid input(s): APPERANCEUR     Component Value Date/Time   CHOL 93 03/10/2018 0451   TRIG 81 03/10/2018 0451   HDL 15 (L) 03/10/2018 0451   CHOLHDL 6.2 03/10/2018 0451   VLDL 16 03/10/2018 0451   LDLCALC 62 03/10/2018 0451   Lab Results  Component Value Date   HGBA1C 5.1 03/10/2018   No results found for: LABOPIA, COCAINSCRNUR, LABBENZ, AMPHETMU, THCU, LABBARB  No results for input(s): ETH in the last 168 hours.  I have personally reviewed the radiological images below and agree with the radiology interpretations.  Ct Angio Head W Or Wo Contrast  Result Date: 03/09/2018 CLINICAL DATA:  67 y/o F; patient extubated yesterday. Right-sided weakness and slow speech. Increased lethargy. EXAM:  CT ANGIOGRAPHY HEAD AND NECK CT PERFUSION BRAIN TECHNIQUE: Multidetector CT imaging of the head and neck was performed using the standard protocol during bolus administration of intravenous contrast. Multiplanar CT image reconstructions and MIPs were obtained to evaluate the vascular anatomy. Carotid stenosis measurements (when applicable) are obtained utilizing NASCET criteria, using the distal internal carotid diameter as the denominator. Multiphase CT imaging of the brain was performed following IV bolus contrast injection. Subsequent parametric perfusion maps were calculated using RAPID software. CONTRAST:  90 cc Isovue 370 COMPARISON:  03/09/2018, 03/04/2018, 02/16/2018 CT head. 02/16/2018 MRI head. FINDINGS: CTA NECK FINDINGS Aortic arch: Standard branching. Imaged portion shows no evidence of aneurysm or dissection. No significant stenosis  of the major arch vessel origins. Mild calcific aortic atherosclerosis. Right carotid system: No evidence of dissection, stenosis (50% or greater) or occlusion. Mild non stenotic calcific atherosclerosis of the carotid bifurcation. Left carotid system: No evidence of dissection, stenosis (50% or greater) or occlusion. Minimal non stenotic calcific atherosclerosis of the carotid bifurcation. Vertebral arteries: Left dominant, diminutive right vertebral artery. No evidence of dissection, stenosis (50% or greater) or occlusion. Skeleton: Moderate spondylosis of the cervical spine with multilevel disc and facet degenerative changes. No high-grade bony canal stenosis. Other neck: Negative. Upper chest: Negative. Review of the MIP images confirms the above findings CTA HEAD FINDINGS Anterior circulation: No significant stenosis, proximal occlusion, aneurysm, or vascular malformation. Mild non stenotic calcific atherosclerosis of the carotid siphons. Posterior circulation: No significant stenosis, proximal occlusion, aneurysm, or vascular malformation. Venous sinuses: As permitted by contrast timing, patent. Anatomic variants: Right vertebral artery largely terminates in the right PICA with diminutive contribution to the basilar. Complete circle-of-Willis. Large left A1, large anterior communicating artery, diminutive right A1, normal variant Review of the MIP images confirms the above findings CT Brain Perfusion Findings: CBF (<30%) Volume: 2mL Perfusion (Tmax>6.0s) volume: 69mL Mismatch Volume: 36mL Infarction Location:Left parietal lobe subacute infarction visible on noncontrast CT, pseudonormalized perfusion. IMPRESSION: 1. Patent carotid and vertebral arteries. No dissection, aneurysm, or hemodynamically significant stenosis utilizing NASCET criteria. 2. Patent anterior and posterior intracranial circulation. No large vessel occlusion, aneurysm, or significant stenosis. 3. Left parietal lobe subacute infarction visible  on noncontrast CT with pseudonormalized perfusion. 4. No perfusion anomaly to suggest interval acute infarct. Electronically Signed   By: Kristine Garbe M.D.   On: 03/09/2018 18:44   Ct Head Wo Contrast  Result Date: 03/09/2018 CLINICAL DATA:  Residual right-sided weakness. EXAM: CT HEAD WITHOUT CONTRAST TECHNIQUE: Contiguous axial images were obtained from the base of the skull through the vertex without intravenous contrast. COMPARISON:  03/04/2018 CT.  02/16/2018 MRI. FINDINGS: Brain: There is acute to subacute infarction in the left parietal lobe consistent with left MCA branch vessel infarction. The brain shows low-density with mild swelling. No hemorrhage. No other abnormal brain finding. No mass, hydrocephalus or extra-axial collection. Vascular: There is atherosclerotic calcification of the major vessels at the base of the brain. Skull: Negative Sinuses/Orbits: Clear/normal Other: None IMPRESSION: Acute/subacute infarction in the left parietal lobe consistent with MCA branch vessel infarction. No evidence of hemorrhage or mass effect. Electronically Signed   By: Nelson Chimes M.D.   On: 03/09/2018 13:31   Ct Head Wo Contrast  Result Date: 03/04/2018 CLINICAL DATA:  Altered level of consciousness EXAM: CT HEAD WITHOUT CONTRAST TECHNIQUE: Contiguous axial images were obtained from the base of the skull through the vertex without intravenous contrast. COMPARISON:  02/16/2018 FINDINGS: Brain: No evidence of acute infarction, hemorrhage, hydrocephalus, extra-axial collection  or mass lesion/mass effect. Vascular: No hyperdense vessel or unexpected calcification. Skull: Normal. Negative for fracture or focal lesion. Sinuses/Orbits: No acute finding. Other: None. IMPRESSION: No acute abnormality noted.  No change from the prior exam. Electronically Signed   By: Inez Catalina M.D.   On: 03/04/2018 09:33   Ct Head Wo Contrast  Result Date: 02/16/2018 CLINICAL DATA:  Generalize weakness and  slurred speech for 2 weeks. EXAM: CT HEAD WITHOUT CONTRAST TECHNIQUE: Contiguous axial images were obtained from the base of the skull through the vertex without intravenous contrast. COMPARISON:  04/01/2005 FINDINGS: Brain: No evidence of acute infarction, hemorrhage, hydrocephalus, extra-axial collection or mass lesion/mass effect. Vascular: Mild intracranial arterial vascular calcifications. Skull: Calvarium appears intact. Sinuses/Orbits: Paranasal sinuses and mastoid air cells are clear. Other: None. IMPRESSION: No acute intracranial abnormality. Electronically Signed   By: Lucienne Capers M.D.   On: 02/16/2018 06:24   Ct Angio Neck W Or Wo Contrast  Result Date: 03/09/2018 CLINICAL DATA:  67 y/o F; patient extubated yesterday. Right-sided weakness and slow speech. Increased lethargy. EXAM: CT ANGIOGRAPHY HEAD AND NECK CT PERFUSION BRAIN TECHNIQUE: Multidetector CT imaging of the head and neck was performed using the standard protocol during bolus administration of intravenous contrast. Multiplanar CT image reconstructions and MIPs were obtained to evaluate the vascular anatomy. Carotid stenosis measurements (when applicable) are obtained utilizing NASCET criteria, using the distal internal carotid diameter as the denominator. Multiphase CT imaging of the brain was performed following IV bolus contrast injection. Subsequent parametric perfusion maps were calculated using RAPID software. CONTRAST:  90 cc Isovue 370 COMPARISON:  03/09/2018, 03/04/2018, 02/16/2018 CT head. 02/16/2018 MRI head. FINDINGS: CTA NECK FINDINGS Aortic arch: Standard branching. Imaged portion shows no evidence of aneurysm or dissection. No significant stenosis of the major arch vessel origins. Mild calcific aortic atherosclerosis. Right carotid system: No evidence of dissection, stenosis (50% or greater) or occlusion. Mild non stenotic calcific atherosclerosis of the carotid bifurcation. Left carotid system: No evidence of  dissection, stenosis (50% or greater) or occlusion. Minimal non stenotic calcific atherosclerosis of the carotid bifurcation. Vertebral arteries: Left dominant, diminutive right vertebral artery. No evidence of dissection, stenosis (50% or greater) or occlusion. Skeleton: Moderate spondylosis of the cervical spine with multilevel disc and facet degenerative changes. No high-grade bony canal stenosis. Other neck: Negative. Upper chest: Negative. Review of the MIP images confirms the above findings CTA HEAD FINDINGS Anterior circulation: No significant stenosis, proximal occlusion, aneurysm, or vascular malformation. Mild non stenotic calcific atherosclerosis of the carotid siphons. Posterior circulation: No significant stenosis, proximal occlusion, aneurysm, or vascular malformation. Venous sinuses: As permitted by contrast timing, patent. Anatomic variants: Right vertebral artery largely terminates in the right PICA with diminutive contribution to the basilar. Complete circle-of-Willis. Large left A1, large anterior communicating artery, diminutive right A1, normal variant Review of the MIP images confirms the above findings CT Brain Perfusion Findings: CBF (<30%) Volume: 25mL Perfusion (Tmax>6.0s) volume: 39mL Mismatch Volume: 35mL Infarction Location:Left parietal lobe subacute infarction visible on noncontrast CT, pseudonormalized perfusion. IMPRESSION: 1. Patent carotid and vertebral arteries. No dissection, aneurysm, or hemodynamically significant stenosis utilizing NASCET criteria. 2. Patent anterior and posterior intracranial circulation. No large vessel occlusion, aneurysm, or significant stenosis. 3. Left parietal lobe subacute infarction visible on noncontrast CT with pseudonormalized perfusion. 4. No perfusion anomaly to suggest interval acute infarct. Electronically Signed   By: Kristine Garbe M.D.   On: 03/09/2018 18:44   Ct Chest Wo Contrast  Result Date: 03/04/2018 CLINICAL DATA:  Shortness of breath, pneumonia EXAM: CT CHEST WITHOUT CONTRAST TECHNIQUE: Multidetector CT imaging of the chest was performed following the standard protocol without IV contrast. COMPARISON:  Chest x-ray 03/04/2018 FINDINGS: Cardiovascular: Heart is normal size. Aorta is normal caliber with scattered aortic calcifications. Moderate coronary artery calcifications in the left anterior descending coronary artery. Mediastinum/Nodes: Reactive borderline sized mediastinal lymph nodes. Lungs/Pleura: Complex large left pleural effusions with loculation and pleural gas. Cannot exclude bronchopleural fistula or empyema. Collapse of much of the left lung with only a small amount of aerated left upper lobe. Nodular airspace opacities in the right upper lobe and superior segment of the right lower lobe with trace right pleural effusion. Upper Abdomen: Imaging into the upper abdomen shows no acute findings. Musculoskeletal: Chest wall soft tissues are unremarkable. Degenerative changes in the thoracic spine. No acute bony abnormality. IMPRESSION: Large complex left pleural effusion with pleural gas and extensive loculations. This could reflect bronchopleural fistula or empyema. Collapse of much of the left lung with only a small amount of aerated left upper lobe. Nodular ground-glass airspace opacities centrally in the right upper lobe and in the superior segment of the right lower lobe, likely infectious/inflammatory. Coronary artery disease, aortic atherosclerosis. Electronically Signed   By: Rolm Baptise M.D.   On: 03/04/2018 11:39   Mr Brain Wo Contrast  Result Date: 03/10/2018 CLINICAL DATA:  Stroke follow-up EXAM: MRI HEAD WITHOUT CONTRAST TECHNIQUE: Multiplanar, multiecho pulse sequences of the brain and surrounding structures were obtained without intravenous contrast. COMPARISON:  Brain MRI 02/16/2018 FINDINGS: BRAIN: There are multiple foci of abnormal diffusion restriction, the largest of which is located in the  left parietal lobe. Other foci are located in the posterior left frontal lobe, right parietal lobe and the pons. There is no midline shift or mass effect. Mild cytotoxic edema of the left parietal lobe. Minimal white matter hyperintensity for age. Brain parenchyma is otherwise normal. The cerebral and cerebellar volume are age-appropriate. Susceptibility-sensitive sequences show no chronic microhemorrhage or superficial siderosis. VASCULAR: Major intracranial arterial and venous sinus flow voids are normal. SKULL AND UPPER CERVICAL SPINE: Calvarial bone marrow signal is normal. There is no skull base mass. Visualized upper cervical spine and soft tissues are normal. SINUSES/ORBITS: No fluid levels or advanced mucosal thickening. No mastoid or middle ear effusion. The orbits are normal. IMPRESSION: 1. Multifocal acute ischemia, spread throughout multiple bilateral vascular territories. 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 2. The largest infarction is in the left parietal lobe. Other sites include the posterior left frontal lobe, right parietal lobe and pons. 3. No hemorrhage or mass effect. Electronically Signed   By: Ulyses Jarred M.D.   On: 03/10/2018 06:17   Mr Brain Wo Contrast  Result Date: 02/16/2018 CLINICAL DATA:  Weight loss and weakness. EXAM: MRI HEAD WITHOUT CONTRAST TECHNIQUE: Multiplanar, multiecho pulse sequences of the brain and surrounding structures were obtained without intravenous contrast. COMPARISON:  CT same day FINDINGS: Brain: Generalized atrophy. Diffusion imaging does not show any acute or subacute infarction. The brainstem and cerebellum are normal. Mild chronic small-vessel ischemic change of the cerebral hemispheric deep white matter. No cortical or large vessel territory infarction. No mass lesion, hemorrhage, hydrocephalus or extra-axial collection. Vascular: Major vessels at the base of the brain show flow. Skull and upper cervical spine: Negative Sinuses/Orbits:  Clear/normal Other: None IMPRESSION: No acute or reversible finding. Generalized atrophy. Mild chronic small-vessel ischemic change of the cerebral hemispheric white matter. Electronically Signed   By: Nelson Chimes  M.D.   On: 02/16/2018 21:52   Nm Hepato W/eject Fract  Result Date: 02/18/2018 CLINICAL DATA:  Weight loss. EXAM: NUCLEAR MEDICINE HEPATOBILIARY IMAGING WITH GALLBLADDER EF TECHNIQUE: Sequential images of the abdomen were obtained out to 60 minutes following intravenous administration of radiopharmaceutical. After oral ingestion of Ensure, gallbladder ejection fraction was determined. At 60 min, normal ejection fraction is greater than 33%. RADIOPHARMACEUTICALS:  5.3 mCi Tc-52m  Choletec IV COMPARISON:  None. FINDINGS: Prompt uptake and biliary excretion of activity by the liver is seen. Gallbladder activity is visualized, consistent with patency of cystic duct. Biliary activity passes into small bowel, consistent with patent common bile duct. The gallbladder emptied spontaneously before the Ensure. As a result, an ejection fraction cannot be accurately calculated. That being said, the gallbladder is not visualized after ensure and gallbladder emptying is near-complete. IMPRESSION: The gallbladder emptied spontaneously before consumption of Ensure. While an ejection fraction cannot be calculated, gallbladder emptying is near complete. Electronically Signed   By: Dorise Bullion III M.D   On: 02/18/2018 13:40   Ct Cerebral Perfusion W Contrast  Result Date: 03/09/2018 CLINICAL DATA:  67 y/o F; patient extubated yesterday. Right-sided weakness and slow speech. Increased lethargy. EXAM: CT ANGIOGRAPHY HEAD AND NECK CT PERFUSION BRAIN TECHNIQUE: Multidetector CT imaging of the head and neck was performed using the standard protocol during bolus administration of intravenous contrast. Multiplanar CT image reconstructions and MIPs were obtained to evaluate the vascular anatomy. Carotid stenosis  measurements (when applicable) are obtained utilizing NASCET criteria, using the distal internal carotid diameter as the denominator. Multiphase CT imaging of the brain was performed following IV bolus contrast injection. Subsequent parametric perfusion maps were calculated using RAPID software. CONTRAST:  90 cc Isovue 370 COMPARISON:  03/09/2018, 03/04/2018, 02/16/2018 CT head. 02/16/2018 MRI head. FINDINGS: CTA NECK FINDINGS Aortic arch: Standard branching. Imaged portion shows no evidence of aneurysm or dissection. No significant stenosis of the major arch vessel origins. Mild calcific aortic atherosclerosis. Right carotid system: No evidence of dissection, stenosis (50% or greater) or occlusion. Mild non stenotic calcific atherosclerosis of the carotid bifurcation. Left carotid system: No evidence of dissection, stenosis (50% or greater) or occlusion. Minimal non stenotic calcific atherosclerosis of the carotid bifurcation. Vertebral arteries: Left dominant, diminutive right vertebral artery. No evidence of dissection, stenosis (50% or greater) or occlusion. Skeleton: Moderate spondylosis of the cervical spine with multilevel disc and facet degenerative changes. No high-grade bony canal stenosis. Other neck: Negative. Upper chest: Negative. Review of the MIP images confirms the above findings CTA HEAD FINDINGS Anterior circulation: No significant stenosis, proximal occlusion, aneurysm, or vascular malformation. Mild non stenotic calcific atherosclerosis of the carotid siphons. Posterior circulation: No significant stenosis, proximal occlusion, aneurysm, or vascular malformation. Venous sinuses: As permitted by contrast timing, patent. Anatomic variants: Right vertebral artery largely terminates in the right PICA with diminutive contribution to the basilar. Complete circle-of-Willis. Large left A1, large anterior communicating artery, diminutive right A1, normal variant Review of the MIP images confirms the above  findings CT Brain Perfusion Findings: CBF (<30%) Volume: 74mL Perfusion (Tmax>6.0s) volume: 30mL Mismatch Volume: 59mL Infarction Location:Left parietal lobe subacute infarction visible on noncontrast CT, pseudonormalized perfusion. IMPRESSION: 1. Patent carotid and vertebral arteries. No dissection, aneurysm, or hemodynamically significant stenosis utilizing NASCET criteria. 2. Patent anterior and posterior intracranial circulation. No large vessel occlusion, aneurysm, or significant stenosis. 3. Left parietal lobe subacute infarction visible on noncontrast CT with pseudonormalized perfusion. 4. No perfusion anomaly to suggest interval acute infarct. Electronically Signed  By: Kristine Garbe M.D.   On: 03/09/2018 18:44   LE venous doppler  Right: Findings consistent with acute deep vein thrombosis involving the right posterior tibial vein. No cystic structure found in the popliteal fossa. Left: Findings consistent with acute deep vein thrombosis involving the left peroneal vein. No cystic structure found in the popliteal fossa.  TTE - Left ventricle: Systolic function was vigorous. The estimated   ejection fraction was in the range of 65% to 70%. Wall motion was   normal; there were no regional wall motion abnormalities. Doppler   parameters are consistent with abnormal left ventricular   relaxation (grade 1 diastolic dysfunction).  TCD bubble study - no PFO at rest  Dg Esophagus W/water Sol Cm 03/12/2018 CLINICAL DATA:  67 year old female with clinical concern for possible esophageal perforation. EXAM: ESOPHOGRAM/BARIUM SWALLOW TECHNIQUE: Single contrast examination was performed using thin barium or water soluble. FLUOROSCOPY TIME:  Fluoroscopy Time:  1 minutes and 24 seconds Radiation Exposure Index (if provided by the fluoroscopic device): 16.5 mGy COMPARISON:  None. FINDINGS: Limited single contrast esophagram with water-soluble contrast material (Omnipaque 300) performed. Failure to  fully propagate any normal primary peristaltic waves. Extensive tertiary contractions. No gross evidence of esophageal mass, stricture or esophageal ring. No hiatal hernia. No extravasation of contrast material from the lumen of the esophagus at any time during the examination. IMPRESSION: 1. No findings to suggest esophageal perforation. 2. Nonspecific esophageal motility disorder with severe tertiary contractions. Electronically Signed   By: Vinnie Langton M.D.   On: 03/12/2018 10:55      PHYSICAL EXAM  Temp:  [98.3 F (36.8 C)-98.6 F (37 C)] 98.4 F (36.9 C) (11/08 1036) Pulse Rate:  [93-107] 102 (11/08 1200) Resp:  [14-20] 15 (11/08 1200) BP: (99-119)/(72-87) 105/78 (11/08 1200) SpO2:  [100 %] 100 % (11/08 1200)  General - Well nourished, well developed, mildly lethargic  Ophthalmologic - fundi not visualized due to noncooperation.  Cardiovascular - Regular rate and rhythm.  Neuro - mildly lethargic with intermittent sleepiness, eyes open. Answer questions appropriately, orientated to self and place, but not to age and time. Perseveration present. Follow most simple commands. Paucity of speech, mild dysarthria. PERRL, EOMI, tracking on both sides. Blinking to visual threat inconsistent bilaterally. Mild left nasolabial fold flattening and tongue protrusion midline. BUE proximal 3-/5 and distally 3/5. BLE no spontaneous movement and mild withdraw on pain stimulation. DTR diminished and right babinski positive. Sensation, coordination not cooperative and gait not tested.   ASSESSMENT/PLAN Ms. Cady E [place] is a 67 y.o. female with history of DM, HTN, colitis admitted for septic shock, respiratory failure and empyema, concerning for esophageal rupture s/p EGD. After extubation, pt was found to have left gaze and right hemiplegia, aphasia. CT head showed left parietal infarct. No tPA given due to no clear LSW.    Stroke:  Left parietal small infarcts, left MCA/ACA, right parietal and  pontine punctate infarcts, cardio embolic pattern, source unknown, Endocarditis vs. Hypercoagulable state due to severe sepsis  CT head left parietal acute infarcts  CTA head and neck and CTP unremarkable  MRI  02/16/18 no acute finding  MRI 50/0/93 cardioembolic infarcts including Left parietal small infarcts, left MCA/ACA, right parietal and pontine punctate infarcts  LE venous doppler b/l LE DVT  TCD bubble study no PFO at rest, not cooperative on valsalva test  Not candidate for TEE due to ? Esophagus perforation  2D Echo EF 65-70%  LDL 62  HgbA1c 5.1  Heparin IV  for VTE prophylaxis  NPO now on TPN  aspirin 81 mg daily prior to admission, now on heparin IV. Consider to switch to DOACs if no further procedure planned.   Ongoing aggressive stroke risk factor management  Therapy recommendations: CIR   Disposition:  Pending   Resp failure due to severe sepsis and empyema  CT chest showed free air and large pleural fluid  S/p chest tube  Intubated on 03/04/18 and now extubated on 03/09/18  Continue unasyn for Pseudomonas, E coli and entococcus in pleural fluid  ? Esophageal perforation   Now NPO on TPN  Pending Gastrografin test to evaluate the esophagus  Leukocytosis WBC 12.4->14.2->17.4->18.0  BLE DVT  LE venous doppler showed BLE DVT with right posterior tibial veins and left peroneal veins distally.  Seems not the source of embolic stroke as no PFO with bubble study  TCD bubble study no PFO at rest, not cooperative on Valsalva test  On heparin IV for DVT treatment and prevention - consider switch to DOACs if no further procedure planned and passed swallow  Stat CT if neuro changes to rule out brain bleed  Diabetes  HgbA1c 5.1 goal < 7.0  Controlled  CBG monitoring  SSI  Hypotension Hx of hypertension . BP on the low side . Not on BP meds . Continue IVF and TPN . Treat infection  Long term BP goal normotensive  Other Stroke Risk  Factors  Advanced age  Other Active Problems  Anemia due to chronic disease - Hb 8.2->8.4->8.6->7.7, treatment per primary team   Leukocytosis WBC 12.4->14.2->17.4->18.0  Elevated LFT and AKP  tachycardia  Hospital day # 8  Neurology will sign off. Please call with questions. Pt will follow up with Dr. Leonie Man at [facility] in about 4-6 weeks. Thanks for the consult.   Rosalin Hawking, MD PhD Stroke Neurology 03/12/2018 3:45 PM    To contact Stroke Continuity provider, please refer to [email or web address] After hours, contact General Neurology

## 2018-03-12 NOTE — Progress Notes (Signed)
PHARMACY - ADULT TOTAL PARENTERAL NUTRITION CONSULT NOTE   Pharmacy Consult:  TPN Indication:  Possible esophageal perforation  Patient Measurements: Height: 5' 2"  (157.5 cm) Weight: 143 lb 8.3 oz (65.1 kg) IBW/kg (Calculated) : 50.1 TPN AdjBW (KG): 53.8 Body mass index is 26.25 kg/m.  Assessment:  75 YOF with moderate malnutrition presented on 03/04/18 with AMS. Patient was recently hospitalized for abdominal pain, failure to thrive and inability to tolerate an oral diet. Now concerned with esophageal perforation, so Pharmacy consulted to manage TPN.  Per documentation, patient has been eating minimally for the past several months and weight decreased from 64.9 to 58.2 kg, now up with fluid resuscitation.  11/6 - transferred out of ICU  GI: hx colitis/gastritis/FTT.  Prealbumin remains low at < 5, Albumin low at 1.4. Last BM was 11/8. PPI IV. SLP 11/6 recommends NPO, as she has not passed swallow study yet. Needs gastrografin test to evaluate esophagus. Endo: DM (no PTA med) - CBGs < 180 (stress steroids d/c'd). Running low in 70s this AM Insulin requirements in the past 24 hours: 0 units SSI Lytes: wnl today. Mg still borderline at 1.7. CoCa 9.7 Renal: SCr stable (BL SCr 0.6), BUN 14 - UOP not charted accurately, NS at 75 ml/hr, net +14.8L Neuro: New acute/subacute stroke 11/5 - started on asa supp, statin when able Pulm: extubated 11/4 > RA Cards: HTN hx - BP low-nl, still tachy, remains off pressors. Permissive HTN s/p stroke 11/5 AC: New bilateral DVT - started on heparin Hepatobil: LFTs normalized, alk phos down to 326, tbili nl, INR down to 1.45, TG WNL ID: [email or web address] for Enterococcal and Ecoli/pseudomonas (not treating since clinically better per MD) empyema - afeb, WBC up to 18 TPN Access: CVC triple lumen placed 03/04/18 TPN start date: 03/06/18  Nutritional Goals (*per updated RD rec on 03/09/18*): 1700-1900 kCal, 95-110gm protein, >/=1.7L fluid per day  Current Nutrition:   TPN NPO  Plan:  Continue TPN at 75 ml/hr. TPN will provide 106g AA, 234g CHO, and 52g lipids for a total of 1742 kCal, meeting 100% of patient needs Electrolytes in TPN: Continue increased Mg, Phos and K. Cl:Ac 1:1 Daily multivitamin and trace elements in TPN  Change to sensitive SSI q8hr and monitor CBGs F/U AM labs and Gastrografin study as able  Elenor Quinones, PharmD, BCPS Clinical Pharmacist Phone number [phone] 03/12/2018 7:39 AM

## 2018-03-12 NOTE — Progress Notes (Signed)
ANTICOAGULATION CONSULT NOTE - Follow Up  Pharmacy Consult:  Heparin Indication: bilateral LE  DVT   Allergies  Allergen Reactions  . Crestor [Rosuvastatin Calcium] Swelling and Other (See Comments)    Swelling of legs and muscle weakness  . Zocor [Simvastatin] Swelling and Other (See Comments)    Swelling of legs and muscle weakness    Patient Measurements: Height: 5\' 2"  (157.5 cm) Weight: 143 lb 8.3 oz (65.1 kg) IBW/kg (Calculated) : 50.1 HEPARIN DW (KG): 63.4  Vital Signs: Temp: 98.4 F (36.9 C) (11/08 1036) Temp Source: Oral (11/08 1036) BP: 115/86 (11/08 1036) Pulse Rate: 95 (11/08 1036)  Labs: Recent Labs    03/10/18 0451 03/10/18 2047 03/11/18 0500 03/12/18 0330  HGB 8.4*  --  8.6* 7.7*  HCT 26.0*  --  25.9* 23.6*  PLT 135*  --  165 173  HEPARINUNFRC  --  0.58 0.49 0.31  CREATININE 0.42*  --  0.40* 0.39*    Estimated Creatinine Clearance: 59.6 mL/min (A) (by C-G formula based on SCr of 0.39 mg/dL (L)).    Assessment: Maria Avila is a 65y old female presented with altered mental status from SNF, found to have empyema. Pharmacy now consulted to dose heparin in setting of bilateral LE DVTs. 11/6 MRI: Multifocal acute ischemia likely from embolic disease. Affected sites are left parietal lobe, right parietal love, and posterior left frontal lobe. Will be conservative in dosing. No bolus.  Heparin level is 0.31 on heparin drip at 700 units/hr.  H/h low stable. No bleeding reported.  Goal of Therapy:  Heparin level 0.3-0.5 units/mL Monitor platelets by anticoagulation protocol: Yes   Plan:  Increase heparin gtt to 750 units/hr to maintain goal Daily heparin level and CBC  Maria Avila, Pharm.D., BCPS Clinical Pharmacist Pager: [phone] Clinical phone for 03/12/2018 from 8:30-4:00 is [phone].  **Pharmacist phone directory can now be found on [email or web address] ([email or web address]).  Listed under [place].  03/12/2018 11:25 AM

## 2018-03-12 NOTE — Evaluation (Signed)
Physical Therapy Evaluation Patient Details Name: Maria Avila MRN: [id number] DOB: [birth year] Today's Date: 03/12/2018   History of Present Illness  67yo female coming [facility] due to decreased responsiveness, found to by hypoxic and hypoglycemic, also in respiratory shock and respiratory failure with emphyema and concern for esophageal rupture. Intubated on 03/04/18, extubated on 03/09/18. Found to have B DVTs on 11/6 and now on heparin. CT positive for acute L parietal infarcts. PMH HTN, DM   Clinical Impression   Patient received in bed, pleasant but fatigued following multiple tests and placement of flexiseal earlier this afternoon. Her husband was present throughout evaluation and assisted in provided PLOF/history/equipment. She requires maxAx2 for supine to sit and presents with strong posterior lean requiring Mod-MaxA, occasionally able to correct but this is very inconsistent. Unable to formally test MMT due to fatigue and difficulty following commands but suspect at best 3-/5 globally and likely even less. 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 Unable to progress mobility past EOB due to fatigue and HR 130BPM at EOB. She was returned to bed with maxAx2 and left in bed with all needs met and family present. She will continue to benefit from skilled PT services in the acute setting as well as intensive therapies in the CIR setting moving forward due to significant functional decline and medical complexity.     Follow Up Recommendations CIR    Equipment Recommendations  Other (comment)(defer to next venue )    Recommendations for Other Services Rehab consult     Precautions / Restrictions Precautions Precautions: Fall;Other (comment) Precaution Comments: chest tube, flexiseal  Restrictions Weight Bearing Restrictions: No      Mobility  Bed Mobility    Bed Mobility: Supine to Sit;Sit to Supine     Supine to sit: Max assist;+2 for physical assistance;+2 for safety/equipment Sit to supine: +2 for physical assistance;+2 for safety/equipment   General bed mobility comments: MaxA+2 for supine to sit. strong posterior lean at EOB requiring fluctuating Mod-MaxA to maintain upright. Able to inconsistently correct posture. HR to 130BPM sitting EOB   Transfers                 General transfer comment: deferred due to strong posterior lean and fatigue, HR   Ambulation/Gait             General Gait Details: deferred due to strong posterior lean and fatigue, HR   Stairs            Wheelchair Mobility    Modified Rankin (Stroke Patients Only)       Balance Overall balance assessment: Needs assistance Sitting-balance support: Bilateral upper extremity supported;Feet supported Sitting balance-Leahy Scale: Poor Sitting balance - Comments: Mod-MaxA to maintain upright due to strong posterior lean  Postural control: Posterior lean   Standing balance-Leahy Scale: Zero                               Pertinent Vitals/Pain Pain Assessment: No/denies pain Faces Pain Scale: No hurt Pain Intervention(s): Limited activity within patient's tolerance;Monitored during session    Home Living Family/patient expects to be discharged to:: Private residence Living Arrangements: Spouse/significant other Available Help at Discharge: Family;Available 24 hours/day Type of Home: House Home Access: Stairs to enter Entrance Stairs-Rails: None Entrance Stairs-Number of Steps: 3 Home Layout: One level Home Equipment:  None Additional Comments: per husband    Prior Function Level of Independence: Needs assistance   Gait / Transfers Assistance Needed: walked with husband's help household distances  ADL's / Homemaking Assistance Needed: pt needing help with showering in the days leading up to this hospitalization, but  typically independent        Hand Dominance   Dominant Hand: Right    Extremity/Trunk Assessment   Upper Extremity Assessment Upper Extremity Assessment: Defer to OT evaluation    Lower Extremity Assessment Lower Extremity Assessment: Generalized weakness RLE Deficits / Details: unable to formally test MMT due to fatigue, based on functional assessment at best 3-/5 B quads  LLE Deficits / Details: unable to formally test MMT due to fatigue, based on functional assessment at best 3-/5 B quads     Cervical / Trunk Assessment Cervical / Trunk Assessment: Kyphotic  Communication   Communication: No difficulties  Cognition Arousal/Alertness: Awake/alert Behavior During Therapy: Flat affect Overall Cognitive Status: Impaired/Different from baseline Area of Impairment: Memory;Safety/judgement;Orientation;Attention;Following commands;Problem solving;Awareness                 Orientation Level: Disoriented to;Place;Time;Situation Current Attention Level: Sustained Memory: Decreased short-term memory;Decreased recall of precautions Following Commands: Follows one step commands inconsistently;Follows one step commands with increased time Safety/Judgement: Decreased awareness of safety;Decreased awareness of deficits Awareness: Intellectual Problem Solving: Slow processing;Difficulty sequencing;Requires verbal cues;Requires tactile cues General Comments: poor attention span, also very fatigued today       General Comments General comments (skin integrity, edema, etc.): possible visual deficits L side, R eye abducted as compared to L/eyes not in alignment    Exercises     Assessment/Plan    PT Assessment Patient needs continued PT services  PT Problem List Decreased strength;Decreased activity tolerance;Decreased balance;Decreased mobility;Decreased cognition;Decreased knowledge of use of DME;Decreased knowledge of precautions;Pain;Decreased safety awareness;Decreased  coordination;Decreased range of motion;Impaired tone       PT Treatment Interventions DME instruction;Balance training;Gait training;Neuromuscular re-education;Stair training;Functional mobility training;Patient/family education;Therapeutic activities;Therapeutic exercise;Manual techniques    PT Goals (Current goals can be found in the Care Plan section)  Acute Rehab PT Goals Patient Stated Goal: get stronger  PT Goal Formulation: With patient/family Time For Goal Achievement: 03/26/18 Potential to Achieve Goals: Fair    Frequency Min 3X/week   Barriers to discharge        Co-evaluation PT/OT/SLP Co-Evaluation/Treatment: Yes Reason for Co-Treatment: Complexity of the patient's impairments (multi-system involvement);For patient/therapist safety;To address functional/ADL transfers PT goals addressed during session: Mobility/safety with mobility;Balance         AM-PAC PT "6 Clicks" Daily Activity  Outcome Measure Difficulty turning over in bed (including adjusting bedclothes, sheets and blankets)?: Unable Difficulty moving from lying on back to sitting on the side of the bed? : Unable Difficulty sitting down on and standing up from a chair with arms (e.g., wheelchair, bedside commode, etc,.)?: Unable Help needed moving to and from a bed to chair (including a wheelchair)?: Total Help needed walking in hospital room?: Total Help needed climbing 3-5 steps with a railing? : Total 6 Click Score: 6    End of Session   Activity Tolerance: Patient limited by fatigue Patient left: in bed;with call bell/phone within reach;with family/visitor present Nurse Communication: Mobility status PT Visit Diagnosis: Muscle weakness (generalized) (M62.81);Adult, failure to thrive (R62.7);Other abnormalities of gait and mobility (R26.89);History of falling (Z91.81);Unsteadiness on feet (R26.81);Difficulty in walking, not elsewhere classified (R26.2)    Time: 1245-8099 PT Time Calculation (min)  (ACUTE ONLY): 32 min  Charges:   PT Evaluation $PT Eval High Complexity: 1 High          Deniece Ree PT, DPT, CBIS  Supplemental Physical Therapist [place]    Pager [phone] Acute Rehab Office [phone]

## 2018-03-12 NOTE — Progress Notes (Signed)
NAME:  Maria Avila, MRN:  [id number], DOB:  1950/11/13, LOS: 8 ADMISSION DATE:  03/04/2018, CONSULTATION DATE:  03/04/2018 REFERRING MD:  Dr. Vanita Panda, CHIEF COMPLAINT:  AMS  Brief History   67 year old female presenting from [facility] for 2 day h/o of less responsive than baseline. Large loculated L pleural effusion, s/p CT on 10/31. Off pressors 11/3.  Past Medical History  DMT2, HTN, unexplained FTT despite extensive work-up. Significant Hospital Events   10/31 admitted, CT placed, CVTS consulted 11/3 weaned off pressors  Consults: date of consult/date signed off & final recs:  CVTS: 10/31 Neurology: 11/6  Procedures (surgical and bedside):  10/31 ETT placed 10/31 Chest tube placed 10/31 L subclavian CVL 11/1 TPA/pulmozyme administration  Significant Diagnostic Tests:  Chest CT 10/31 with effusion and air in pleural space CXR 11/2: near complete resolution of the left pleural effusion. CXR 11/4: persistent consolidation Left lung base MRI 11/6: Multifocal acute ischemia likely from embolic disease. Affected sites are left parietal lobe, right parietal love, and posterior left frontal lobe 11/8 barium swallow: esophageal dysmotility, no leak  Micro Data:  Blood 10/31: NG 4 days Ucx: multiple species present Tracheal aspirate: few multiple organisms present Pleural fluid: E. Coli, enteroccoccus avium, pseudomonas. Sensitive to all but ampicillin MRSA PCR: neg  Antimicrobials:  Flagyl 10/31-10/31 (1 dose) Vancomycin 10/31>>> 11/3 Zosyn 10/31>>> 11/3 Unasyn 11/4>>>  Subjective:  Breathing is OK  Objective   Blood pressure 105/78, pulse (!) 102, temperature 98.4 F (36.9 C), temperature source Oral, resp. rate 15, height 5\' 2"  (1.575 m), weight 65.1 kg, SpO2 100 %.        Intake/Output Summary (Last 24 hours) at 03/12/2018 1236 Last data filed at 03/12/2018 1003 Gross per 24 hour  Intake 1919 ml  Output 1925 ml  Net -6 ml   Filed Weights   03/09/18 0400 03/10/18 0500 03/10/18 2141  Weight: 62.7 kg 64.4 kg 65.1 kg    Examination:  General:  Resting comfortably in bed HENT: NCAT OP clear PULM: Clear on right, diminished on left, chest tube in place, subclavian line in place CV: RRR, no mgr GI: BS+, soft, nontender MSK: normal bulk and tone Neuro: awake, alert, no distress, MAEW    Resolved Hospital Problem list   Septic shock Hypophosphatemia  Assessment & Plan:   Polymicrobial empyema on left: suspect due to aspiration pneumonia > will repeat CT chest as there is still fluid in her left lung despite chest tube, may need additional TPA or additional tube > ideally want to remove all fluid if possible to reduce long term complications (lung entrapment) > maintain chest tube for now > agree with unasyn  Central line: needs to be D/C'd > will write order for PICC as I suspect she will need long term IV therapies  Nutrition: TPN per primary  Esophageal dysmotility: likely cause of aspiration > will ultimately need GI to see, likely as outpatient        Labs (personally reviewed)  CBC: Recent Labs  Lab 03/06/18 0408 03/07/18 0352 03/08/18 0414 03/09/18 0350 03/10/18 0451 03/11/18 0500 03/12/18 0330  WBC 20.1* 16.9* 13.1* 12.4* 14.2* 17.4* 18.0*  NEUTROABS 16.2* 14.5* 9.9* 11.3* 11.9*  --   --   HGB 9.6* 8.6* 8.5* 8.2* 8.4* 8.6* 7.7*  HCT 28.8* 24.7* 24.2* 24.6* 26.0* 25.9* 23.6*  MCV 90.3 88.5 88.3 90.1 90.9 92.8 94.0  PLT 183 121* 102* 122* 135* 165 790    Basic Metabolic Panel: Recent  Labs  Lab 03/07/18 0352 03/08/18 0414  03/08/18 2021 03/09/18 0350 03/10/18 0451 03/11/18 0500 03/12/18 0330  NA 137 143   < > 140 143 139 140 138  K 3.5 2.6*   < > 5.6* 3.1* 3.7 3.9 4.0  CL 117* 117*   < > 113* 115* 108 110 108  CO2 17* 21*   < > 24 24 28 25 26   GLUCOSE 180* 149*   < > 117* 123* 125* 80 106*  BUN 30* 24*   < > 25* 19 17 14 14   CREATININE 1.25* 0.93   < > 0.77 0.66 0.42* 0.40* 0.39*    CALCIUM 8.1* 8.2*   < > 8.0* 8.1* 7.8* 7.7* 7.6*  MG 2.0 1.7  --   --  2.0  --  1.7 1.7  PHOS 2.9 2.7  --   --  1.7* 2.7 2.9 3.2   < > = values in this interval not displayed.   GFR: Estimated Creatinine Clearance: 59.6 mL/min (A) (by C-G formula based on SCr of 0.39 mg/dL (L)). Recent Labs  Lab 03/09/18 0350 03/10/18 0451 03/11/18 0500 03/12/18 0330  WBC 12.4* 14.2* 17.4* 18.0*    Liver Function Tests: Recent Labs  Lab 03/07/18 0352 03/08/18 0414 03/09/18 0350 03/10/18 0451 03/11/18 0500  AST 51* 56* 55* 49* 34  ALT 37 47* 50* 49* 36  ALKPHOS 341* 380* 475* 408* 326*  BILITOT 1.3* 0.8 0.8 0.7 0.5  PROT 4.5* 4.6* 4.4* 4.3* 4.5*  ALBUMIN 1.4* 1.4* 1.3* 1.3* 1.4*   No results for input(s): LIPASE, AMYLASE in the last 168 hours. No results for input(s): AMMONIA in the last 168 hours.  ABG    Component Value Date/Time   PHART 7.334 (L) 03/06/2018 0406   PCO2ART 32.8 03/06/2018 0406   PO2ART 157.0 (H) 03/06/2018 0406   HCO3 17.6 (L) 03/06/2018 0406   TCO2 19 (L) 03/06/2018 0406   ACIDBASEDEF 8.0 (H) 03/06/2018 0406   O2SAT 99.0 03/06/2018 0406     Coagulation Profile: Recent Labs  Lab 03/08/18 0414  INR 1.45    Cardiac Enzymes: No results for input(s): CKTOTAL, CKMB, CKMBINDEX, TROPONINI in the last 168 hours.  HbA1C: Hgb A1c MFr Bld  Date/Time Value Ref Range Status  03/10/2018 04:51 AM 5.1 4.8 - 5.6 % Final    Comment:    (NOTE) Pre diabetes:          5.7%-6.4% Diabetes:              >6.4% Glycemic control for   <7.0% adults with diabetes     CBG: Recent Labs  Lab 03/11/18 0018 03/11/18 0629 03/11/18 1121 03/11/18 2357 03/12/18 0830  GLUCAP 130* 74 154* 111* 112*

## 2018-03-12 NOTE — Progress Notes (Signed)
Modified Barium Swallow Progress Note  Patient Details  Name: Maria Avila MRN: [id number] Date of Birth: 09/30/1950  Today's Date: 03/12/2018  Modified Barium Swallow completed.  Full report located under Chart Review in the Imaging Section.  Brief recommendations include the following:  Clinical Impression  Pt has a mild-moderate oral dysphagia that is felt to be at least in part due to cognitive status. She has reduced bolus cohesion, slow posterior transit, premature spillage particularly wtih thin liquids, and lingual residue. Occasional oral holding was noted, requiring only Min cues from SLP. With extra time and use of additional swallows she is able to sufficiently clear her oral cavity, but it is cumbersome. Her pharyngeal phase is timely and strong with good airway protection and clearance. Recommend starting Dys 1 diet and thin liquids with full supervision. SLP to f/u clinically for readiness to advance solids. Will also f/u for completion of speech/language evaluation.   Swallow Evaluation Recommendations       SLP Diet Recommendations: Dysphagia 1 (Puree) solids;Thin liquid   Liquid Administration via: Straw;Cup   Medication Administration: Crushed with puree   Supervision: Staff to assist with self feeding;Full supervision/cueing for compensatory strategies   Compensations: Slow rate;Small sips/bites   Postural Changes: Seated upright at 90 degrees   Oral Care Recommendations: Oral care BID   Other Recommendations: Have oral suction available    Germain Osgood 03/12/2018,5:22 PM   Germain Osgood, M.A. [place] Acute Environmental education officer [phone] Office [phone]

## 2018-03-12 NOTE — Progress Notes (Signed)
Rehab Admissions Coordinator Note:  Patient was screened by Cleatrice Burke for appropriateness for an Inpatient Acute Rehab Consult per PT and OT recommendations.   At this time, we are recommending Inpatient Rehab consult.  Danne Baxter, RN, MSN Rehab Admissions Coordinator [phone] 03/12/2018 3:33 PM

## 2018-03-12 NOTE — Progress Notes (Signed)
Orthopedic Tech Progress Note Patient Details:  Maria Avila 1950/10/02 [id number]  Ortho Devices Type of Ortho Device: Prafo boot/shoe Ortho Device/Splint Interventions: Application   Post Interventions Patient Tolerated: Well Instructions Provided: Adjustment of device, Care of device   Melony Overly T 03/12/2018, 2:38 PM

## 2018-03-12 NOTE — Progress Notes (Signed)
Placed flexiseal rectal tube per MD order. Pt and family educated and in agreement. Pt tolerated procedure fair. Tubing attached to bed for gravity flow. Pt resting with call bell within reach.  Will continue to monitor. Payton Emerald, RN

## 2018-03-12 NOTE — Progress Notes (Signed)
Pharmacy Antibiotic Note  Maria Avila is a 67 y.o. female admitted on 03/04/2018 with an empyema and septic shock. Previously on vancomycin and Zosyn. Pleural fluid culture now growing E. coli and Enterococcus avium as well as pseudomonas.   She was started on unasyn pending status before changing to zosyn since Unasyn won't cover pseudomonas. Apparently, she still have fluid in lungs. D/w [place], will change to zosyn today.  Plan: Dc Unasyn Zosyn   Height: 5\' 2"  (157.5 cm) Weight: 143 lb 8.3 oz (65.1 kg) IBW/kg (Calculated) : 50.1  Temp (24hrs), Avg:98.4 F (36.9 C), Min:98.1 F (36.7 C), Max:98.6 F (37 C)  Recent Labs  Lab 03/08/18 0414  03/08/18 2021 03/09/18 0350 03/10/18 0451 03/11/18 0500 03/12/18 0330  WBC 13.1*  --   --  12.4* 14.2* 17.4* 18.0*  CREATININE 0.93   < > 0.77 0.66 0.42* 0.40* 0.39*   < > = values in this interval not displayed.    Antimicrobials this admission: Flagyl 10/31 x1 Cefepime 10/31 x1 Vancomycin 10/31 >> 11/3 Zosyn 10/31 >> 11/3 Unasyn 11/3 >>11/8 Zosyn 11/8>>  Microbiology results: 10/31 MRSA PCR: negative 10/31 BCx: NG x3 10/31 TA cx: multiple organisms 10/31 pleural fluid cx: E.coli (S-Unasyn), Enterococcus avium (S-amp), pseudomonas (pan-sensitive)  Onnie Boer, PharmD, BCIDP, AAHIVP, CPP Infectious Disease Pharmacist 03/12/2018 1:30 PM

## 2018-03-12 NOTE — Progress Notes (Signed)
  Speech Language Pathology Treatment: Dysphagia  Patient Details Name: Maria Avila MRN: [id number] DOB: 1950-11-26 Today's Date: 03/12/2018 Time: 6433-2951 SLP Time Calculation (min) (ACUTE ONLY): 16 min  Assessment / Plan / Recommendation Clinical Impression  Pt's water soluable esophagram resulted in no evidence of perforation, nonspecific esophageal motility disorder with severe tertiary contractions. Oral care provided and pt consumed thin liquid with mild spill due to labial weakness. No overt indications of aspiration with thin or puree although risk is elevated with recent intubation and CVA's. Initially unable to have MBS performed today however slot became available this afternoon. Will objectively assess and make safest recommendations.    HPI HPI: Pt is a 67 year old admitted with respiratory failure, left lung empyema s/p CT placement 10/31, and septic shock. ETT 10/31-11/4. MRI 11/6 showed multifocal, bilateral acute ischemia with largest infarction in the L parietal lobe. Additional areas include the L frontal lobe, R parietal lobe, and pons. PMH: malnutrition and unexplained FTT (s/p recent GI and psych consults), HTN, DM      SLP Plan  MBS       Recommendations  Diet recommendations: NPO Medication Administration: Via alternative means                Oral Care Recommendations: Oral care QID Follow up Recommendations: Skilled Nursing facility SLP Visit Diagnosis: Dysphagia, unspecified (R13.10) Plan: MBS                       Maria Avila 03/12/2018, 3:33 PM  Maria Avila M.Ed Risk analyst [phone] Office [phone]

## 2018-03-12 NOTE — Progress Notes (Signed)
PROGRESS NOTE                                                                                                                                                                                                             Patient Demographics:    Maria Avila, is a 67 y.o. female, DOB - [birth year], QHU:[id number]  Admit date - 03/04/2018   Admitting Physician [name], MD  Outpatient Primary MD for the patient is [name], MD  LOS - 8   Chief Complaint  Patient presents with  . Altered Mental Status       Brief Narrative    67 y.o. female with history of malnutrition, hypertension, diabetes and colitis , was admitted to ICU 03/04/2018 due to septic shock from empyema, patient with recent hospitalization letter to that significant for endoscopy with no acute findings, she remained in SNF for 4 days, then she presents with septic shock, work-up significant for empyema, requiring pressors, intubation, became off pressors, extubated, then she was noted to have moving her right side, and had left gaze preference, work-up significant for acute CVA, embolic, as well she had acute DVT, she was transferred to [facility] care 03/11/2018.  10/31 ETT placed 10/31 Chest tube placed 11/1 TPA/pulmozyme administration  11/4 extubated   Subjective:    Maria Avila today denies any complaints, per nursing staff she still having diarrhea, Flexi-Seal has been ordered, she was seen yesterday by wound care regarding her pressure ulcer .   Assessment  & Plan :    Active Problems:   Severe protein-calorie malnutrition (HCC)   Empyema (HCC)   Pressure injury of skin   Acute respiratory failure with hypercapnia (HCC)   Malnutrition of moderate degree   Cerebral embolism with cerebral infarction   Chest tube in place   Esophageal perforation   Perforation esophagus   Sepsis with acute renal failure (HCC)   Acute deep vein thrombosis (DVT) of both  peroneal veins   Leukocytosis   Sepsis due to Pseudomonas species ([place])  Septic shock due to empyema -Present on admission, requiring pressure support, CT chest showing free air and large pleural effusion, status post chest tube 07/03/2017,. - intubated on admission 03/04/2018, extubated 03/09/2018 -Edema culture showing Pseudomonas, E. coli and enterococcus and pleural fluid, continue with Unasyn.  Having leukocytosis, continue with antibiotics -  CT surgery input greatly appreciated, chest tube care per CT surgery, no output. -There was question about esophageal perforation, felt unlikely by CT surgery, but recommendation is for Gastrografin study to rule out perforation, contact us if angiogram done today, there is no evidence of esophageal perforation, will await SLP evaluation to clear her for swallowing, then she can be started on a diet per the recommendation, and TPN can be discontinued.  Acute CVA - Left parietal small infarcts, left MCA/ACA, right parietal and pontine punctate infarcts, -Most likely embolic pattern, cardioembolic versus DVT in the setting of PFO, versus endocarditis, versus hypercoagulable state due to severe sepsis. -Currently on heparin for her lower extremity DVT, follow on TCD bubble study, follow on 2D echo -Treatment per neurology -Still on TPN as she did not pass her swallow evaluation.  Supposed to have modified barium study today.  Acute respiratory failure -Due to empyema and septic shock on admission, requiring intubation, currently extubated, on oxygen via nasal cannula, wean as tolerated,.  Bilateral lower extremity DVT -Lower extremity venous Doppler showing bilateral lower extremity DVT with right posterior tibial veins and left peroneal veins distally -Follow on TCD bubble study, as it could be source of embolic stroke if PFO is present -Continue with heparin GTT for now. can Be transitioned to oral regimen she passes swallow  evaluation  Diabetes -Sliding scale, hemoglobin A1c is 5.1  Protein calorie malnutrition -Currently on TPN, did not pass swallow study yet  Anemia -Hemoglobin 7.7 today, she is Hemoccult negative last week, transfuse 1 unit PRBC  Code Status : partial  Family Communication  : D/W husband at bedside  Disposition Plan  : not Stable for discharge at  Consults  : PCCM, CT surgery, neuurology  Procedures  :  10/31 ETT placed 10/31 Chest tube placed 11/1 TPA/pulmozyme administration  11/4 extubated  DVT Prophylaxis  :  Heparin GTT  Lab Results  Component Value Date   PLT 173 03/12/2018    Antibiotics  :    Anti-infectives (From admission, onward)   Start     Dose/Rate Route Frequency Ordered Stop   03/07/18 1000  Ampicillin-Sulbactam (UNASYN) 3 g in sodium chloride 0.9 % 100 mL IVPB     3 g 200 mL/hr over 30 Minutes Intravenous Every 8 hours 03/07/18 0948     03/05/18 1200  vancomycin (VANCOCIN) 500 mg in sodium chloride 0.9 % 100 mL IVPB  Status:  Discontinued     500 mg 100 mL/hr over 60 Minutes Intravenous Every 24 hours 03/04/18 1801 03/07/18 0948   03/04/18 2200  piperacillin-tazobactam (ZOSYN) IVPB 3.375 g  Status:  Discontinued     3.375 g 12.5 mL/hr over 240 Minutes Intravenous Every 8 hours 03/04/18 1801 03/07/18 0948   03/04/18 0915  ceFEPIme (MAXIPIME) 2 g in sodium chloride 0.9 % 100 mL IVPB     2 g 200 mL/hr over 30 Minutes Intravenous  Once 03/04/18 0910 03/04/18 1000   03/04/18 0915  metroNIDAZOLE (FLAGYL) IVPB 500 mg  Status:  Discontinued     500 mg 100 mL/hr over 60 Minutes Intravenous Every 8 hours 03/04/18 0910 03/04/18 1801   03/04/18 0915  vancomycin (VANCOCIN) IVPB 1000 mg/200 mL premix     1,000 mg 200 mL/hr over 60 Minutes Intravenous  Once 03/04/18 0910 03/04/18 1338        Objective:   Vitals:   03/12/18 0427 03/12/18 0731 03/12/18 1029 03/12/18 1036  BP: 99/72 112/87 115/86 115/86  Pulse: 93 98  95  Resp: 19 17 20 16   Temp: 98.6  F (37 C)   98.4 F (36.9 C)  TempSrc: Oral   Oral  SpO2: 100% 100% 100% 100%  Weight:      Height:        Wt Readings from Last 3 Encounters:  03/10/18 65.1 kg  02/16/18 48.7 kg  02/05/18 48.1 kg     Intake/Output Summary (Last 24 hours) at 03/12/2018 1208 Last data filed at 03/12/2018 1003 Gross per 24 hour  Intake 1919 ml  Output 1925 ml  Net -6 ml     Physical Exam  Awake Alert, pleasant, communicative, conversant  symmetrical Chest wall movement, Good air movement bilaterally, chest tube left lung, some rales in the left lung, no use of accessory muscles RRR,No Gallops,Rubs or new Murmurs, No Parasternal Heave +ve B.Sounds, Abd Soft, No tenderness, No rebound - guarding or rigidity. No Cyanosis, Clubbing or edema, No new Rash or bruise, has right-sided weakness  Patient  with left subclavian line, upper extremity midline, and left side chest tube    Data Review:    CBC Recent Labs  Lab 03/06/18 0408 03/07/18 0352 03/08/18 0414 03/09/18 0350 03/10/18 0451 03/11/18 0500 03/12/18 0330  WBC 20.1* 16.9* 13.1* 12.4* 14.2* 17.4* 18.0*  HGB 9.6* 8.6* 8.5* 8.2* 8.4* 8.6* 7.7*  HCT 28.8* 24.7* 24.2* 24.6* 26.0* 25.9* 23.6*  PLT 183 121* 102* 122* 135* 165 173  MCV 90.3 88.5 88.3 90.1 90.9 92.8 94.0  MCH 30.1 30.8 31.0 30.0 29.4 30.8 30.7  MCHC 33.3 34.8 35.1 33.3 32.3 33.2 32.6  RDW 15.5 15.6* 15.6* 15.6* 15.6* 16.1* 16.3*  LYMPHSABS 2.4 1.4 1.9 0.9 1.7  --   --   MONOABS 0.8 1.0 0.7 0.1 0.6  --   --   EOSABS 0.0 0.0 0.0 0.1 0.0  --   --   BASOSABS 0.1 0.0 0.0 0.0 0.0  --   --     Chemistries  Recent Labs  Lab 03/07/18 0352 03/08/18 0414  03/08/18 2021 03/09/18 0350 03/10/18 0451 03/11/18 0500 03/12/18 0330  NA 137 143   < > 140 143 139 140 138  K 3.5 2.6*   < > 5.6* 3.1* 3.7 3.9 4.0  CL 117* 117*   < > 113* 115* 108 110 108  CO2 17* 21*   < > 24 24 28 25 26   GLUCOSE 180* 149*   < > 117* 123* 125* 80 106*  BUN 30* 24*   < > 25* 19 17 14 14    CREATININE 1.25* 0.93   < > 0.77 0.66 0.42* 0.40* 0.39*  CALCIUM 8.1* 8.2*   < > 8.0* 8.1* 7.8* 7.7* 7.6*  MG 2.0 1.7  --   --  2.0  --  1.7 1.7  AST 51* 56*  --   --  55* 49* 34  --   ALT 37 47*  --   --  50* 49* 36  --   ALKPHOS 341* 380*  --   --  475* 408* 326*  --   BILITOT 1.3* 0.8  --   --  0.8 0.7 0.5  --    < > = values in this interval not displayed.   ------------------------------------------------------------------------------------------------------------------ Recent Labs    03/10/18 0451  CHOL 93  HDL 15*  LDLCALC 62  TRIG 81  CHOLHDL 6.2    Lab Results  Component Value Date   HGBA1C 5.1 03/10/2018   ------------------------------------------------------------------------------------------------------------------ No results for input(s):  TSH, T4TOTAL, T3FREE, THYROIDAB in the last 72 hours.  Invalid input(s): FREET3 ------------------------------------------------------------------------------------------------------------------ No results for input(s): VITAMINB12, FOLATE, FERRITIN, TIBC, IRON, RETICCTPCT in the last 72 hours.  Coagulation profile Recent Labs  Lab 03/08/18 0414  INR 1.45    No results for input(s): DDIMER in the last 72 hours.  Cardiac Enzymes No results for input(s): CKMB, TROPONINI, MYOGLOBIN in the last 168 hours.  Invalid input(s): CK ------------------------------------------------------------------------------------------------------------------ No results found for: BNP  Inpatient Medications  Scheduled Meds: . chlorhexidine  15 mL Mouth Rinse BID  . Chlorhexidine Gluconate Cloth  6 each Topical Daily  . collagenase   Topical Daily  . Gerhardt's butt cream   Topical Daily  . insulin aspart  0-9 Units Subcutaneous Q8H  . mouth rinse  15 mL Mouth Rinse q12n4p  . pantoprazole (PROTONIX) IV  40 mg Intravenous Q24H  . sodium chloride flush  10-40 mL Intracatheter Q12H   Continuous Infusions: . sodium chloride Stopped  (03/04/18 2139)  . sodium chloride 75 mL/hr at 03/12/18 0408  . ampicillin-sulbactam (UNASYN) IV 3 g (03/12/18 0855)  . heparin 700 Units/hr (03/11/18 2307)  . TPN ADULT (ION) 75 mL/hr at 03/11/18 1900  . TPN ADULT (ION)     PRN Meds:.bisacodyl, sodium chloride flush  Micro Results Recent Results (from the past 240 hour(s))  Blood Culture (routine x 2)     Status: None   Collection Time: 03/04/18  9:10 AM  Result Value Ref Range Status   Specimen Description   Final    BLOOD LEFT HAND Performed at [facility] [address], [place], [place] [address]    Special Requests   Final    BOTTLES DRAWN AEROBIC AND ANAEROBIC Blood Culture results may not be optimal due to an inadequate volume of blood received in culture bottles Performed at [facility] [address], [place], [place] [address]    Culture   Final    NO GROWTH 5 DAYS Performed at [facility], [place] [address] [place], [place] [address]    Report Status 03/09/2018 FINAL  Final  MRSA PCR Screening     Status: None   Collection Time: 03/04/18  2:50 PM  Result Value Ref Range Status   MRSA by PCR NEGATIVE NEGATIVE Final    Comment:        The GeneXpert MRSA Assay (FDA approved for NASAL specimens only), is one component of a comprehensive MRSA colonization surveillance program. It is not intended to diagnose MRSA infection nor to guide or monitor treatment for MRSA infections. Performed at [facility], [place] [address] [place], [place] [address]   Culture, respiratory (tracheal aspirate)     Status: None   Collection Time: 03/04/18  3:50 PM  Result Value Ref Range Status   Specimen Description TRACHEAL ASPIRATE  Final   Special Requests Normal  Final   Gram Stain   Final    ABUNDANT WBC PRESENT, PREDOMINANTLY PMN ABUNDANT GRAM POSITIVE COCCI FEW GRAM POSITIVE RODS FEW GRAM NEGATIVE RODS Performed at [facility], [place] [address] [place], [place]  [address]    Culture FEW MULTIPLE ORGANISMS PRESENT, NONE PREDOMINANT  Final   Report Status 03/07/2018 FINAL  Final  Body fluid culture     Status: None   Collection Time: 03/04/18  4:32 PM  Result Value Ref Range Status   Specimen Description FLUID PLEURAL  Final   Special Requests NONE  Final   Gram  Stain   Final    ABUNDANT WBC PRESENT, PREDOMINANTLY PMN MODERATE GRAM POSITIVE COCCI IN PAIRS IN CLUSTERS FEW GRAM POSITIVE RODS FEW GRAM NEGATIVE RODS Performed at [facility], [place] [address], [place], [place] [address]    Culture   Final    RARE ESCHERICHIA COLI FEW ENTEROCOCCUS AVIUM RARE PSEUDOMONAS AERUGINOSA    Report Status 03/09/2018 FINAL  Final   Organism ID, Bacteria ESCHERICHIA COLI  Final   Organism ID, Bacteria ENTEROCOCCUS AVIUM  Final   Organism ID, Bacteria PSEUDOMONAS AERUGINOSA  Final      Susceptibility   Enterococcus avium - MIC*    AMPICILLIN <=2 SENSITIVE Sensitive     VANCOMYCIN <=0.5 SENSITIVE Sensitive     GENTAMICIN SYNERGY SENSITIVE Sensitive     * FEW ENTEROCOCCUS AVIUM   Escherichia coli - MIC*    AMPICILLIN >=32 RESISTANT Resistant     CEFAZOLIN <=4 SENSITIVE Sensitive     CEFEPIME <=1 SENSITIVE Sensitive     CEFTAZIDIME <=1 SENSITIVE Sensitive     CEFTRIAXONE <=1 SENSITIVE Sensitive     CIPROFLOXACIN <=0.25 SENSITIVE Sensitive     GENTAMICIN <=1 SENSITIVE Sensitive     IMIPENEM <=0.25 SENSITIVE Sensitive     TRIMETH/SULFA <=20 SENSITIVE Sensitive     AMPICILLIN/SULBACTAM 8 SENSITIVE Sensitive     PIP/TAZO <=4 SENSITIVE Sensitive     Extended ESBL NEGATIVE Sensitive     * RARE ESCHERICHIA COLI   Pseudomonas aeruginosa - MIC*    CEFTAZIDIME 4 SENSITIVE Sensitive     CIPROFLOXACIN <=0.25 SENSITIVE Sensitive     GENTAMICIN <=1 SENSITIVE Sensitive     IMIPENEM <=0.25 SENSITIVE Sensitive     PIP/TAZO 8 SENSITIVE Sensitive     CEFEPIME 2 SENSITIVE Sensitive     * RARE PSEUDOMONAS AERUGINOSA  Culture, blood (routine x 2)     Status: None    Collection Time: 03/04/18  5:42 PM  Result Value Ref Range Status   Specimen Description BLOOD SITE NOT SPECIFIED  Final   Special Requests   Final    BOTTLES DRAWN AEROBIC AND ANAEROBIC Blood Culture adequate volume   Culture   Final    NO GROWTH 5 DAYS Performed at [facility], [address], [place], [place] [address]    Report Status 03/09/2018 FINAL  Final  Culture, blood (routine x 2)     Status: None   Collection Time: 03/04/18  5:42 PM  Result Value Ref Range Status   Specimen Description BLOOD SITE NOT SPECIFIED  Final   Special Requests   Final    BOTTLES DRAWN AEROBIC AND ANAEROBIC Blood Culture adequate volume   Culture   Final    NO GROWTH 5 DAYS Performed at [facility], [address], [place], [place] [address]    Report Status 03/09/2018 FINAL  Final  Urine culture     Status: Abnormal   Collection Time: 03/04/18  6:47 PM  Result Value Ref Range Status   Specimen Description URINE, CATHETERIZED  Final   Special Requests   Final    Normal Performed at [facility], [place] [address] [place],  [address]    Culture MULTIPLE SPECIES PRESENT, SUGGEST RECOLLECTION (A)  Final   Report Status 03/05/2018 FINAL  Final    Radiology Reports Ct Angio Head W Or Wo Contrast  Result Date: 03/09/2018 CLINICAL DATA:  67 y/o F; patient extubated yesterday. Right-sided weakness and slow speech. Increased lethargy. EXAM: CT ANGIOGRAPHY  HEAD AND NECK CT PERFUSION BRAIN TECHNIQUE: Multidetector CT imaging of the head and neck was performed using the standard protocol during bolus administration of intravenous contrast. Multiplanar CT image reconstructions and MIPs were obtained to evaluate the vascular anatomy. Carotid stenosis measurements (when applicable) are obtained utilizing NASCET criteria, using the distal internal carotid diameter as the denominator. Multiphase CT imaging of the brain was performed following IV bolus contrast injection. Subsequent  parametric perfusion maps were calculated using RAPID software. CONTRAST:  90 cc Isovue 370 COMPARISON:  03/09/2018, 03/04/2018, 02/16/2018 CT head. 02/16/2018 MRI head. FINDINGS: CTA NECK FINDINGS Aortic arch: Standard branching. Imaged portion shows no evidence of aneurysm or dissection. No significant stenosis of the major arch vessel origins. Mild calcific aortic atherosclerosis. Right carotid system: No evidence of dissection, stenosis (50% or greater) or occlusion. Mild non stenotic calcific atherosclerosis of the carotid bifurcation. Left carotid system: No evidence of dissection, stenosis (50% or greater) or occlusion. Minimal non stenotic calcific atherosclerosis of the carotid bifurcation. Vertebral arteries: Left dominant, diminutive right vertebral artery. No evidence of dissection, stenosis (50% or greater) or occlusion. Skeleton: Moderate spondylosis of the cervical spine with multilevel disc and facet degenerative changes. No high-grade bony canal stenosis. Other neck: Negative. Upper chest: Negative. Review of the MIP images confirms the above findings CTA HEAD FINDINGS Anterior circulation: No significant stenosis, proximal occlusion, aneurysm, or vascular malformation. Mild non stenotic calcific atherosclerosis of the carotid siphons. Posterior circulation: No significant stenosis, proximal occlusion, aneurysm, or vascular malformation. Venous sinuses: As permitted by contrast timing, patent. Anatomic variants: Right vertebral artery largely terminates in the right PICA with diminutive contribution to the basilar. Complete circle-of-Willis. Large left A1, large anterior communicating artery, diminutive right A1, normal variant Review of the MIP images confirms the above findings CT Brain Perfusion Findings: CBF (<30%) Volume: 24mL Perfusion (Tmax>6.0s) volume: 49mL Mismatch Volume: 42mL Infarction Location:Left parietal lobe subacute infarction visible on noncontrast CT, pseudonormalized perfusion.  IMPRESSION: 1. Patent carotid and vertebral arteries. No dissection, aneurysm, or hemodynamically significant stenosis utilizing NASCET criteria. 2. Patent anterior and posterior intracranial circulation. No large vessel occlusion, aneurysm, or significant stenosis. 3. Left parietal lobe subacute infarction visible on noncontrast CT with pseudonormalized perfusion. 4. No perfusion anomaly to suggest interval acute infarct. Electronically Signed   By: Kristine Garbe M.D.   On: 03/09/2018 18:44   Ct Head Wo Contrast  Result Date: 03/09/2018 CLINICAL DATA:  Residual right-sided weakness. EXAM: CT HEAD WITHOUT CONTRAST TECHNIQUE: Contiguous axial images were obtained from the base of the skull through the vertex without intravenous contrast. COMPARISON:  03/04/2018 CT.  02/16/2018 MRI. FINDINGS: Brain: There is acute to subacute infarction in the left parietal lobe consistent with left MCA branch vessel infarction. The brain shows low-density with mild swelling. No hemorrhage. No other abnormal brain finding. No mass, hydrocephalus or extra-axial collection. Vascular: There is atherosclerotic calcification of the major vessels at the base of the brain. Skull: Negative Sinuses/Orbits: Clear/normal Other: None IMPRESSION: Acute/subacute infarction in the left parietal lobe consistent with MCA branch vessel infarction. No evidence of hemorrhage or mass effect. Electronically Signed   By: Nelson Chimes M.D.   On: 03/09/2018 13:31   Ct Head Wo Contrast  Result Date: 03/04/2018 CLINICAL DATA:  Altered level of consciousness EXAM: CT HEAD WITHOUT CONTRAST TECHNIQUE: Contiguous axial images were obtained from the base of the skull through the vertex without intravenous contrast. COMPARISON:  02/16/2018 FINDINGS: Brain: No evidence of acute infarction, hemorrhage, hydrocephalus, extra-axial collection or mass  lesion/mass effect. Vascular: No hyperdense vessel or unexpected calcification. Skull: Normal. Negative  for fracture or focal lesion. Sinuses/Orbits: No acute finding. Other: None. IMPRESSION: No acute abnormality noted.  No change from the prior exam. Electronically Signed   By: Inez Catalina M.D.   On: 03/04/2018 09:33   Ct Head Wo Contrast  Result Date: 02/16/2018 CLINICAL DATA:  Generalize weakness and slurred speech for 2 weeks. EXAM: CT HEAD WITHOUT CONTRAST TECHNIQUE: Contiguous axial images were obtained from the base of the skull through the vertex without intravenous contrast. COMPARISON:  04/01/2005 FINDINGS: Brain: No evidence of acute infarction, hemorrhage, hydrocephalus, extra-axial collection or mass lesion/mass effect. Vascular: Mild intracranial arterial vascular calcifications. Skull: Calvarium appears intact. Sinuses/Orbits: Paranasal sinuses and mastoid air cells are clear. Other: None. IMPRESSION: No acute intracranial abnormality. Electronically Signed   By: Lucienne Capers M.D.   On: 02/16/2018 06:24   Ct Angio Neck W Or Wo Contrast  Result Date: 03/09/2018 CLINICAL DATA:  67 y/o F; patient extubated yesterday. Right-sided weakness and slow speech. Increased lethargy. EXAM: CT ANGIOGRAPHY HEAD AND NECK CT PERFUSION BRAIN TECHNIQUE: Multidetector CT imaging of the head and neck was performed using the standard protocol during bolus administration of intravenous contrast. Multiplanar CT image reconstructions and MIPs were obtained to evaluate the vascular anatomy. Carotid stenosis measurements (when applicable) are obtained utilizing NASCET criteria, using the distal internal carotid diameter as the denominator. Multiphase CT imaging of the brain was performed following IV bolus contrast injection. Subsequent parametric perfusion maps were calculated using RAPID software. CONTRAST:  90 cc Isovue 370 COMPARISON:  03/09/2018, 03/04/2018, 02/16/2018 CT head. 02/16/2018 MRI head. FINDINGS: CTA NECK FINDINGS Aortic arch: Standard branching. Imaged portion shows no evidence of aneurysm or  dissection. No significant stenosis of the major arch vessel origins. Mild calcific aortic atherosclerosis. Right carotid system: No evidence of dissection, stenosis (50% or greater) or occlusion. Mild non stenotic calcific atherosclerosis of the carotid bifurcation. Left carotid system: No evidence of dissection, stenosis (50% or greater) or occlusion. Minimal non stenotic calcific atherosclerosis of the carotid bifurcation. Vertebral arteries: Left dominant, diminutive right vertebral artery. No evidence of dissection, stenosis (50% or greater) or occlusion. Skeleton: Moderate spondylosis of the cervical spine with multilevel disc and facet degenerative changes. No high-grade bony canal stenosis. Other neck: Negative. Upper chest: Negative. Review of the MIP images confirms the above findings CTA HEAD FINDINGS Anterior circulation: No significant stenosis, proximal occlusion, aneurysm, or vascular malformation. Mild non stenotic calcific atherosclerosis of the carotid siphons. Posterior circulation: No significant stenosis, proximal occlusion, aneurysm, or vascular malformation. Venous sinuses: As permitted by contrast timing, patent. Anatomic variants: Right vertebral artery largely terminates in the right PICA with diminutive contribution to the basilar. Complete circle-of-Willis. Large left A1, large anterior communicating artery, diminutive right A1, normal variant Review of the MIP images confirms the above findings CT Brain Perfusion Findings: CBF (<30%) Volume: 7mL Perfusion (Tmax>6.0s) volume: 29mL Mismatch Volume: 55mL Infarction Location:Left parietal lobe subacute infarction visible on noncontrast CT, pseudonormalized perfusion. IMPRESSION: 1. Patent carotid and vertebral arteries. No dissection, aneurysm, or hemodynamically significant stenosis utilizing NASCET criteria. 2. Patent anterior and posterior intracranial circulation. No large vessel occlusion, aneurysm, or significant stenosis. 3. Left  parietal lobe subacute infarction visible on noncontrast CT with pseudonormalized perfusion. 4. No perfusion anomaly to suggest interval acute infarct. Electronically Signed   By: Kristine Garbe M.D.   On: 03/09/2018 18:44   Ct Chest Wo Contrast  Result Date: 03/04/2018 CLINICAL DATA:  Shortness  of breath, pneumonia EXAM: CT CHEST WITHOUT CONTRAST TECHNIQUE: Multidetector CT imaging of the chest was performed following the standard protocol without IV contrast. COMPARISON:  Chest x-ray 03/04/2018 FINDINGS: Cardiovascular: Heart is normal size. Aorta is normal caliber with scattered aortic calcifications. Moderate coronary artery calcifications in the left anterior descending coronary artery. Mediastinum/Nodes: Reactive borderline sized mediastinal lymph nodes. Lungs/Pleura: Complex large left pleural effusions with loculation and pleural gas. Cannot exclude bronchopleural fistula or empyema. Collapse of much of the left lung with only a small amount of aerated left upper lobe. Nodular airspace opacities in the right upper lobe and superior segment of the right lower lobe with trace right pleural effusion. Upper Abdomen: Imaging into the upper abdomen shows no acute findings. Musculoskeletal: Chest wall soft tissues are unremarkable. Degenerative changes in the thoracic spine. No acute bony abnormality. IMPRESSION: Large complex left pleural effusion with pleural gas and extensive loculations. This could reflect bronchopleural fistula or empyema. Collapse of much of the left lung with only a small amount of aerated left upper lobe. Nodular ground-glass airspace opacities centrally in the right upper lobe and in the superior segment of the right lower lobe, likely infectious/inflammatory. Coronary artery disease, aortic atherosclerosis. Electronically Signed   By: Rolm Baptise M.D.   On: 03/04/2018 11:39   Mr Brain Wo Contrast  Result Date: 03/10/2018 CLINICAL DATA:  Stroke follow-up EXAM: MRI HEAD  WITHOUT CONTRAST TECHNIQUE: Multiplanar, multiecho pulse sequences of the brain and surrounding structures were obtained without intravenous contrast. COMPARISON:  Brain MRI 02/16/2018 FINDINGS: BRAIN: There are multiple foci of abnormal diffusion restriction, the largest of which is located in the left parietal lobe. Other foci are located in the posterior left frontal lobe, right parietal lobe and the pons. There is no midline shift or mass effect. Mild cytotoxic edema of the left parietal lobe. Minimal white matter hyperintensity for age. Brain parenchyma is otherwise normal. The cerebral and cerebellar volume are age-appropriate. Susceptibility-sensitive sequences show no chronic microhemorrhage or superficial siderosis. VASCULAR: Major intracranial arterial and venous sinus flow voids are normal. SKULL AND UPPER CERVICAL SPINE: Calvarial bone marrow signal is normal. There is no skull base mass. Visualized upper cervical spine and soft tissues are normal. SINUSES/ORBITS: No fluid levels or advanced mucosal thickening. No mastoid or middle ear effusion. The orbits are normal. IMPRESSION: 1. Multifocal acute ischemia, spread throughout multiple bilateral vascular territories. 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 2. The largest infarction is in the left parietal lobe. Other sites include the posterior left frontal lobe, right parietal lobe and pons. 3. No hemorrhage or mass effect. Electronically Signed   By: Ulyses Jarred M.D.   On: 03/10/2018 06:17   Mr Brain Wo Contrast  Result Date: 02/16/2018 CLINICAL DATA:  Weight loss and weakness. EXAM: MRI HEAD WITHOUT CONTRAST TECHNIQUE: Multiplanar, multiecho pulse sequences of the brain and surrounding structures were obtained without intravenous contrast. COMPARISON:  CT same day FINDINGS: Brain: Generalized atrophy. Diffusion imaging does not show any acute or subacute infarction. The brainstem and cerebellum are normal. Mild chronic small-vessel  ischemic change of the cerebral hemispheric deep white matter. No cortical or large vessel territory infarction. No mass lesion, hemorrhage, hydrocephalus or extra-axial collection. Vascular: Major vessels at the base of the brain show flow. Skull and upper cervical spine: Negative Sinuses/Orbits: Clear/normal Other: None IMPRESSION: No acute or reversible finding. Generalized atrophy. Mild chronic small-vessel ischemic change of the cerebral hemispheric white matter. Electronically Signed   By: Jan Fireman.D.  On: 02/16/2018 21:52   Nm Hepato W/eject Fract  Result Date: 02/18/2018 CLINICAL DATA:  Weight loss. EXAM: NUCLEAR MEDICINE HEPATOBILIARY IMAGING WITH GALLBLADDER EF TECHNIQUE: Sequential images of the abdomen were obtained out to 60 minutes following intravenous administration of radiopharmaceutical. After oral ingestion of Ensure, gallbladder ejection fraction was determined. At 60 min, normal ejection fraction is greater than 33%. RADIOPHARMACEUTICALS:  5.3 mCi Tc-68m  Choletec IV COMPARISON:  None. FINDINGS: Prompt uptake and biliary excretion of activity by the liver is seen. Gallbladder activity is visualized, consistent with patency of cystic duct. Biliary activity passes into small bowel, consistent with patent common bile duct. The gallbladder emptied spontaneously before the Ensure. As a result, an ejection fraction cannot be accurately calculated. That being said, the gallbladder is not visualized after ensure and gallbladder emptying is near-complete. IMPRESSION: The gallbladder emptied spontaneously before consumption of Ensure. While an ejection fraction cannot be calculated, gallbladder emptying is near complete. Electronically Signed   By: Dorise Bullion III M.D   On: 02/18/2018 13:40   Ct Cerebral Perfusion W Contrast  Result Date: 03/09/2018 CLINICAL DATA:  67 y/o F; patient extubated yesterday. Right-sided weakness and slow speech. Increased lethargy. EXAM: CT ANGIOGRAPHY HEAD  AND NECK CT PERFUSION BRAIN TECHNIQUE: Multidetector CT imaging of the head and neck was performed using the standard protocol during bolus administration of intravenous contrast. Multiplanar CT image reconstructions and MIPs were obtained to evaluate the vascular anatomy. Carotid stenosis measurements (when applicable) are obtained utilizing NASCET criteria, using the distal internal carotid diameter as the denominator. Multiphase CT imaging of the brain was performed following IV bolus contrast injection. Subsequent parametric perfusion maps were calculated using RAPID software. CONTRAST:  90 cc Isovue 370 COMPARISON:  03/09/2018, 03/04/2018, 02/16/2018 CT head. 02/16/2018 MRI head. FINDINGS: CTA NECK FINDINGS Aortic arch: Standard branching. Imaged portion shows no evidence of aneurysm or dissection. No significant stenosis of the major arch vessel origins. Mild calcific aortic atherosclerosis. Right carotid system: No evidence of dissection, stenosis (50% or greater) or occlusion. Mild non stenotic calcific atherosclerosis of the carotid bifurcation. Left carotid system: No evidence of dissection, stenosis (50% or greater) or occlusion. Minimal non stenotic calcific atherosclerosis of the carotid bifurcation. Vertebral arteries: Left dominant, diminutive right vertebral artery. No evidence of dissection, stenosis (50% or greater) or occlusion. Skeleton: Moderate spondylosis of the cervical spine with multilevel disc and facet degenerative changes. No high-grade bony canal stenosis. Other neck: Negative. Upper chest: Negative. Review of the MIP images confirms the above findings CTA HEAD FINDINGS Anterior circulation: No significant stenosis, proximal occlusion, aneurysm, or vascular malformation. Mild non stenotic calcific atherosclerosis of the carotid siphons. Posterior circulation: No significant stenosis, proximal occlusion, aneurysm, or vascular malformation. Venous sinuses: As permitted by contrast timing,  patent. Anatomic variants: Right vertebral artery largely terminates in the right PICA with diminutive contribution to the basilar. Complete circle-of-Willis. Large left A1, large anterior communicating artery, diminutive right A1, normal variant Review of the MIP images confirms the above findings CT Brain Perfusion Findings: CBF (<30%) Volume: 56mL Perfusion (Tmax>6.0s) volume: 40mL Mismatch Volume: 78mL Infarction Location:Left parietal lobe subacute infarction visible on noncontrast CT, pseudonormalized perfusion. IMPRESSION: 1. Patent carotid and vertebral arteries. No dissection, aneurysm, or hemodynamically significant stenosis utilizing NASCET criteria. 2. Patent anterior and posterior intracranial circulation. No large vessel occlusion, aneurysm, or significant stenosis. 3. Left parietal lobe subacute infarction visible on noncontrast CT with pseudonormalized perfusion. 4. No perfusion anomaly to suggest interval acute infarct. Electronically Signed   By:  Kristine Garbe M.D.   On: 03/09/2018 18:44   Dg Chest Port 1 View  Result Date: 03/08/2018 CLINICAL DATA:  67 year old female with empyema. Subsequent encounter. EXAM: PORTABLE CHEST 1 VIEW COMPARISON:  03/06/2018 chest x-ray and 03/04/2018 chest CT. FINDINGS: Left-sided chest tube is in place. Left chest wall subcutaneous emphysema once again noted. Questionable tiny left apical pneumothorax versus overlapping structures. Persistent consolidation left lung base. This may represent residua of empyema and underlying atelectasis or infiltrate. The gas containing component of empyema noted on recent chest x-ray and prior CT are not well delineated on the present exam. Endotracheal tube tip 3.4 cm above the carina. Left central line tip distal superior vena cava level. Nasogastric tube courses below the diaphragm. Tip is not included on the present exam. Heart size top-normal slightly enlarged.  Calcified aorta. IMPRESSION: 1. Left-sided chest tube  is in place. Left chest wall subcutaneous emphysema once again noted. 2. Questionable tiny left apical pneumothorax versus overlapping structures. 3. Persistent consolidation left lung base. This may represent residua of empyema and underlying atelectasis or infiltrate. The gas containing component of empyema noted on recent chest x-ray and prior CT are not well delineated on the present exam. Electronically Signed   By: Genia Del M.D.   On: 03/08/2018 07:18   Dg Chest Port 1 View  Result Date: 03/06/2018 CLINICAL DATA:  Empyema with Wiley EXAM: PORTABLE CHEST 1 VIEW COMPARISON:  05/13/2009 FINDINGS: endotracheal tube, NG tube, and central venous line unchanged. LEFT chest tube in place with LEFT basilar atelectasis and consolidation. Shallow pneumothorax along the lateral chest wall adjacent to the along the tract of the chest tube. Small amount subpulmonic gas additionally. Extensive subcutaneous gas along the LEFT chest wall associated with chest tube. RIGHT lung is clear. IMPRESSION: 1. Small pneumothorax in the lateral LEFT hemithorax adjacent to the chest tube. 2. LEFT basilar atelectasis and consolidation. 3. Stable support apparatus. Electronically Signed   By: Suzy Bouchard M.D.   On: 03/06/2018 08:23   Dg Chest Port 1 View  Result Date: 03/05/2018 CLINICAL DATA:  ET tube and chest tube present, respiratory failure. EXAM: PORTABLE CHEST 1 VIEW COMPARISON:  03/04/2018. FINDINGS: Support tubes and lines remain stable. LEFT chest tube redemonstrated, with slight improved basilar, apical, and lateral pneumothorax. Overall pneumothorax estimated 10% or prep slightly greater. RIGHT lung clear. RIGHT base volume loss with effusion. Cardiomediastinal silhouette unchanged. Subcutaneous air is increased. IMPRESSION: Slight decrease LEFT pneumothorax, with significant residual components throughout the hemithorax. Increased subcutaneous air. Electronically Signed   By: Staci Righter M.D.   On: 03/05/2018  09:10   Dg Chest Port 1 View  Result Date: 03/04/2018 CLINICAL DATA:  Endotracheal tube, nasogastric tube and chest tube placement. EXAM: PORTABLE CHEST 1 VIEW COMPARISON:  Chest x-ray from earlier same day. FINDINGS: LEFT-sided chest tube in place. Improved aeration of the LEFT hemithorax. Persistent pneumothorax at the LEFT lung base, moderate in size, with associated overlying airspace collapse. Small component of the pneumothorax seen at the LEFT lung apex. Dense opacity overlying the LEFT heart border is also compatible with atelectasis. RIGHT lung is clear. Heart size and mediastinal contours are within normal limits. Endotracheal tube appears well positioned with tip approximately 3 cm above the carina. Enteric tube passes below the diaphragm. LEFT subclavian central line appears well positioned with tip at the level of the lower SVC/cavoatrial junction. IMPRESSION: 1. Support apparatus appears appropriately positioned, as detailed above. 2. Significantly improved aeration of the LEFT hemithorax status  post LEFT-sided chest tube placement. Persistent pneumothorax on the LEFT, moderate in size, main component at the LEFT lung base with associated perihilar airspace collapse. Electronically Signed   By: Franki Cabot M.D.   On: 03/04/2018 17:26   Dg Chest Port 1 View  Result Date: 03/04/2018 CLINICAL DATA:  67 year old female with altered mental status, not responding. EXAM: PORTABLE CHEST 1 VIEW COMPARISON:  Portable chest 02/22/2018. FINDINGS: Portable AP upright view at 0854 hours. Near complete opacification of the left hemithorax where as only mild left lung base opacity was present 10 days ago. No shift of the mediastinum. The right lung remains clear allowing for portable technique. Visualized tracheal air column is within normal limits. No pneumothorax. Paucity bowel gas in the upper abdomen. No acute osseous abnormality identified. IMPRESSION: New subtotal opacification of the left hemithorax  is nonspecific but suspicious for combination of left lung consolidation and pleural effusion. The right lung remains clear. Electronically Signed   By: Genevie Ann M.D.   On: 03/04/2018 09:41   Dg Chest Port 1 View  Result Date: 02/22/2018 CLINICAL DATA:  Leukocytosis. EXAM: PORTABLE CHEST 1 VIEW COMPARISON:  None. FINDINGS: Subtle asymmetric prominence of the LEFT perihilar shadow, suspicious for perihilar pneumonia and/or lymphadenopathy. Subtle opacity at the LEFT lung base, compatible with pneumonia or mild atelectasis. RIGHT lung is clear. Osseous structures about the chest are unremarkable. IMPRESSION: 1. Suspected LEFT perihilar pneumonia and/or lymphadenopathy. Consider chest CT for confirmation. 2. Subtle opacity at the LEFT lung base which could represent pneumonia, atelectasis and/or small pleural effusion. Electronically Signed   By: Franki Cabot M.D.   On: 02/22/2018 16:10   Vas [place] Transcranial Doppler W Bubbles  Result Date: 03/11/2018  Transcranial Doppler with Bubble Indications: Stroke. Performing Technologist: Landry Mellow RDMS, RVT  Examination Guidelines: A complete evaluation includes B-mode imaging, spectral Doppler, color Doppler, and power Doppler as needed of all accessible portions of each vessel. Bilateral testing is considered an integral part of a complete examination. Limited examinations for reoccurring indications may be performed as noted.  Summary:  A vascular evaluation was performed. The right middle cerebral artery was studied. An IV was inserted into the patient's right upper arm PICC line. Verbal informed consent was obtained.  Limited study due to poor patient cooperation, unable to perform Valsalva. No HITS heard at rest. No evidence of PFO with this test. Negative TCD Bubble study *See table(s) above for measurements and observations.  Diagnosing physician: Antony Contras MD Electronically signed by Antony Contras MD on 03/11/2018 at 3:51:31 PM.    Final    Vas [place] Lower  Extremity Venous (dvt)  Result Date: 03/10/2018  Lower Venous Study Indications: Stroke, and embolic stroke.  Limitations: Body habitus, bandages, line and adjecent arterial cacification shadowing,severe edema and immobility. Performing Technologist: Lorina Rabon  Examination Guidelines: A complete evaluation includes B-mode imaging, spectral Doppler, color Doppler, and power Doppler as needed of all accessible portions of each vessel. Bilateral testing is considered an integral part of a complete examination. Limited examinations for reoccurring indications may be performed as noted.  Right Venous Findings: +---------+---------------+---------+-----------+----------+-------------------+          CompressibilityPhasicitySpontaneityPropertiesSummary             +---------+---------------+---------+-----------+----------+-------------------+ CFV      Full           Yes      Yes                                      +---------+---------------+---------+-----------+----------+-------------------+  SFJ      Full                                                             +---------+---------------+---------+-----------+----------+-------------------+ FV Prox  Full                                                             +---------+---------------+---------+-----------+----------+-------------------+ FV Mid   Full                                                             +---------+---------------+---------+-----------+----------+-------------------+ FV DistalFull                                                             +---------+---------------+---------+-----------+----------+-------------------+ PFV      Full                                                             +---------+---------------+---------+-----------+----------+-------------------+ POP      Full           Yes      Yes                                       +---------+---------------+---------+-----------+----------+-------------------+ PTV      None                    No                   Acute               +---------+---------------+---------+-----------+----------+-------------------+ PERO                                                  not well visualized +---------+---------------+---------+-----------+----------+-------------------+  Left Venous Findings: +---------+---------------+---------+-----------+----------+-------------------+          CompressibilityPhasicitySpontaneityPropertiesSummary             +---------+---------------+---------+-----------+----------+-------------------+ CFV      Full           Yes      Yes                                      +---------+---------------+---------+-----------+----------+-------------------+ SFJ  Full                                                             +---------+---------------+---------+-----------+----------+-------------------+ FV Prox  Full                                                             +---------+---------------+---------+-----------+----------+-------------------+ FV Mid   Full                                                             +---------+---------------+---------+-----------+----------+-------------------+ FV DistalFull                                                             +---------+---------------+---------+-----------+----------+-------------------+ PFV      Full                                                             +---------+---------------+---------+-----------+----------+-------------------+ POP      Full           Yes      Yes                                      +---------+---------------+---------+-----------+----------+-------------------+ PTV                                                   not well visualized  +---------+---------------+---------+-----------+----------+-------------------+ PERO     None                    No                   Acute               +---------+---------------+---------+-----------+----------+-------------------+    Summary: Right: Findings consistent with acute deep vein thrombosis involving the right posterior tibial vein. No cystic structure found in the popliteal fossa. Left: Findings consistent with acute deep vein thrombosis involving the left peroneal vein. No cystic structure found in the popliteal fossa.  *See table(s) above for measurements and observations. Electronically signed by Curt Jews MD on 03/10/2018 at 2:11:56 PM.    Final    Dg Esophagus W/water Raelene Bott Cm  Result Date: 03/12/2018 CLINICAL DATA:  67 year old female with clinical concern for possible esophageal perforation. EXAM: ESOPHOGRAM/BARIUM SWALLOW TECHNIQUE:  Single contrast examination was performed using thin barium or water soluble. FLUOROSCOPY TIME:  Fluoroscopy Time:  1 minutes and 24 seconds Radiation Exposure Index (if provided by the fluoroscopic device): 16.5 mGy COMPARISON:  None. FINDINGS: Limited single contrast esophagram with water-soluble contrast material (Omnipaque 300) performed. Failure to fully propagate any normal primary peristaltic waves. Extensive tertiary contractions. No gross evidence of esophageal mass, stricture or esophageal ring. No hiatal hernia. No extravasation of contrast material from the lumen of the esophagus at any time during the examination. IMPRESSION: 1. No findings to suggest esophageal perforation. 2. Nonspecific esophageal motility disorder with severe tertiary contractions. Electronically Signed   By: Vinnie Langton M.D.   On: 03/12/2018 10:55     Phillips Climes M.D on 03/12/2018 at 12:08 PM  Between 7am to 7pm - Pager - [phone]  After 7pm go to [email or web address] - password [facility]  [facility] -  Office  [phone]

## 2018-03-12 NOTE — Evaluation (Signed)
Occupational Therapy Evaluation Patient Details Name: Maria Avila MRN: [id number] DOB: 05/29/1950 Today's Date: 03/12/2018    History of Present Illness 67yo female coming from [facility] due to decreased responsiveness, found to by hypoxic and hypoglycemic, also in respiratory shock and respiratory failure with emphyema and concern for esophageal rupture. Intubated on 03/04/18, extubated on 03/09/18. Found to have B DVTs on 11/6 and now on heparin. CT positive for acute L parietal infarcts. PMH HTN, DM    Clinical Impression   Per husband, pt walked with his assistance household distances and was able to bathe and dress herself until her prior hospitalization. She presents with significant, global weakness, impaired cognition and poor sitting balance. Unable to accurately assess vision due fatigue and cognition. Pt requires total assist for all ADL and 2 person assist for bed level mobility. Did not assess standing or transfers due to strong posterior bias in sitting. Pt will need extensive rehab. Recommending CIR. Will follow acutely.    Follow Up Recommendations  CIR    Equipment Recommendations  Other (comment)(defer to next venue)    Recommendations for Other Services       Precautions / Restrictions Precautions Precautions: Fall;Other (comment) Precaution Comments: chest tube, flexiseal  Restrictions Weight Bearing Restrictions: No      Mobility Bed Mobility Overal bed mobility: Needs Assistance Bed Mobility: Supine to Sit;Sit to Supine     Supine to sit: Max assist;+2 for physical assistance;+2 for safety/equipment Sit to supine: +2 for physical assistance;+2 for safety/equipment   General bed mobility comments: MaxA+2 for supine to sit. strong posterior lean at EOB requiring fluctuating Mod-MaxA to maintain upright. Able to inconsistently correct posture. HR to 130BPM sitting EOB   Transfers                 General transfer comment: deferred  due to strong posterior lean and fatigue, HR     Balance Overall balance assessment: Needs assistance Sitting-balance support: Bilateral upper extremity supported;Feet supported Sitting balance-Leahy Scale: Poor Sitting balance - Comments: Mod-MaxA to maintain upright due to strong posterior lean  Postural control: Posterior lean   Standing balance-Leahy Scale: Zero                             ADL either performed or assessed with clinical judgement   ADL                                         General ADL Comments: pt requires total care, NPO     Vision Baseline Vision/History: Wears glasses Wears Glasses: At all times Patient Visual Report: Other (comment)(pt unable to state) Additional Comments: needs further assessment, pt unable to comply with requirements of formal testing due to fatigue and impaired cognition, pt do not appear aligned, pt denies diplopia     Perception     Praxis      Pertinent Vitals/Pain Pain Assessment: No/denies pain Faces Pain Scale: No hurt Pain Intervention(s): Limited activity within patient's tolerance;Monitored during session     Hand Dominance Right   Extremity/Trunk Assessment Upper Extremity Assessment Upper Extremity Assessment: Generalized weakness;RUE deficits/detail;LUE deficits/detail RUE Deficits / Details: pt reports her R UE feels "different than her L," strength 2/5 shoulder,3/5 elbow, 3-/5 gross graspt  RUE Coordination: decreased fine motor;decreased gross motor LUE Deficits / Details: 2/5 shoulder, 3/5 elbow  to hand LUE Coordination: decreased fine motor;decreased gross motor   Lower Extremity Assessment Lower Extremity Assessment: Defer to PT evaluation RLE Deficits / Details: unable to formally test MMT due to fatigue, based on functional assessment at best 3-/5 B quads  LLE Deficits / Details: unable to formally test MMT due to fatigue, based on functional assessment at best 3-/5 B quads     Cervical / Trunk Assessment Cervical / Trunk Assessment: Kyphotic(with forward head)   Communication Communication Communication: No difficulties(minimal verbalization)   Cognition Arousal/Alertness: Awake/alert Behavior During Therapy: Flat affect Overall Cognitive Status: Impaired/Different from baseline Area of Impairment: Orientation;Attention;Memory;Following commands;Safety/judgement;Awareness;Problem solving                 Orientation Level: Disoriented to;Place;Time;Situation Current Attention Level: Focused Memory: Decreased short-term memory;Decreased recall of precautions Following Commands: Follows one step commands inconsistently;Follows one step commands with increased time Safety/Judgement: Decreased awareness of safety;Decreased awareness of deficits Awareness: Intellectual Problem Solving: Slow processing;Difficulty sequencing;Requires verbal cues;Requires tactile cues General Comments: fatigues very easily   General Comments  possible visual deficits L side, R eye abducted as compared to L/eyes not in alignment    Exercises     Shoulder Instructions      Home Living Family/patient expects to be discharged to:: Private residence Living Arrangements: Spouse/significant other Available Help at Discharge: Family;Available 24 hours/day Type of Home: House Home Access: Stairs to enter [employer] of Steps: 3 Entrance Stairs-Rails: None Home Layout: One level     Bathroom Shower/Tub: Occupational psychologist: Standard     Home Equipment: None   Additional Comments: per husband      Prior Functioning/Environment Level of Independence: Needs assistance  Gait / Transfers Assistance Needed: walked with husband's help household distances ADL's / Homemaking Assistance Needed: pt needing help with showering in the days leading up to this hospitalization, but typically independent Communication / Swallowing Assistance Needed:  chart reports that family has noticed some recent slurring. Pt did not verbalize much during eval but was intelligible when she did          OT Problem List: Decreased strength;Decreased activity tolerance;Impaired balance (sitting and/or standing);Decreased coordination;Impaired vision/perception;Decreased cognition;Decreased safety awareness;Decreased knowledge of use of DME or AE;Impaired UE functional use      OT Treatment/Interventions: Self-care/ADL training;Neuromuscular education;DME and/or AE instruction;Therapeutic activities;Cognitive remediation/compensation;Patient/family education;Balance training;Therapeutic exercise    OT Goals(Current goals can be found in the care plan section) Acute Rehab OT Goals Patient Stated Goal: get stronger  OT Goal Formulation: With family Time For Goal Achievement: 03/26/18 Potential to Achieve Goals: Fair ADL Goals Pt Will Perform Grooming: with mod assist;sitting Pt Will Transfer to Toilet: with +2 assist;with mod assist;stand pivot transfer Pt/caregiver will Perform Home Exercise Program: Increased strength;Both right and left upper extremity;With written HEP provided;With minimal assist Additional ADL Goal #1: Pt will perform bed mobility with moderate assistance in preparation for ADL. Additional ADL Goal #2: Pt will demonstrate fair sitting balance at EOB as a precursor to ADL. Additional ADL Goal #3: Pt will follow one step commands with 50% accuracy. Additional ADL Goal #4: Pt will demonstrate sustained attention as a precursor to ADL.  OT Frequency: Min 3X/week   Barriers to D/C:            Co-evaluation PT/OT/SLP Co-Evaluation/Treatment: Yes Reason for Co-Treatment: For patient/therapist safety;Complexity of the patient's impairments (multi-system involvement) PT goals addressed during session: Mobility/safety with mobility;Balance OT goals addressed during session: Strengthening/ROM  AM-PAC PT "6 Clicks" Daily Activity      Outcome Measure Help from another person eating meals?: Total Help from another person taking care of personal grooming?: Total Help from another person toileting, which includes using toliet, bedpan, or urinal?: Total Help from another person bathing (including washing, rinsing, drying)?: Total Help from another person to put on and taking off regular upper body clothing?: Total Help from another person to put on and taking off regular lower body clothing?: Total 6 Click Score: 6   End of Session Nurse Communication: Mobility status;Other (comment)(needs PRAFOs)  Activity Tolerance: Patient tolerated treatment well Patient left: with call bell/phone within reach;in bed;with family/visitor present  OT Visit Diagnosis: Muscle weakness (generalized) (M62.81);Other symptoms and signs involving cognitive function                Time: 2800-3491 OT Time Calculation (min): 32 min Charges:  OT General Charges $OT Visit: 1 Visit OT Evaluation $OT Eval Moderate Complexity: 1 Mod  Nestor Lewandowsky, OTR/L Acute Rehabilitation Services Pager: [phone] Office: [phone]  Malka So 03/12/2018, 3:26 PM

## 2018-03-13 ENCOUNTER — Inpatient Hospital Stay (HOSPITAL_COMMUNITY): Payer: Medicare Other

## 2018-03-13 LAB — TYPE AND SCREEN
ABO/RH(D): O POS
Antibody Screen: NEGATIVE
Unit division: 0

## 2018-03-13 LAB — CBC
HCT: 29.5 % — ABNORMAL LOW (ref 36.0–46.0)
Hemoglobin: 9.5 g/dL — ABNORMAL LOW (ref 12.0–15.0)
MCH: 29.9 pg (ref 26.0–34.0)
MCHC: 32.2 g/dL (ref 30.0–36.0)
MCV: 92.8 fL (ref 80.0–100.0)
NRBC: 0.2 % (ref 0.0–0.2)
Platelets: 206 10*3/uL (ref 150–400)
RBC: 3.18 MIL/uL — ABNORMAL LOW (ref 3.87–5.11)
RDW: 16.1 % — AB (ref 11.5–15.5)
WBC: 17.9 10*3/uL — ABNORMAL HIGH (ref 4.0–10.5)

## 2018-03-13 LAB — GLUCOSE, CAPILLARY: GLUCOSE-CAPILLARY: 97 mg/dL (ref 70–99)

## 2018-03-13 LAB — BASIC METABOLIC PANEL
Anion gap: 3 — ABNORMAL LOW (ref 5–15)
BUN: 17 mg/dL (ref 8–23)
CALCIUM: 7.8 mg/dL — AB (ref 8.9–10.3)
CHLORIDE: 111 mmol/L (ref 98–111)
CO2: 24 mmol/L (ref 22–32)
Creatinine, Ser: 0.48 mg/dL (ref 0.44–1.00)
GFR calc non Af Amer: >60 mL/min (ref >60)
GLUCOSE: 87 mg/dL (ref 70–99)
Potassium: 4.1 mmol/L (ref 3.5–5.1)
Sodium: 138 mmol/L (ref 135–145)

## 2018-03-13 LAB — BPAM RBC
BLOOD PRODUCT EXPIRATION DATE: 201912082359
ISSUE DATE / TIME: 201911081802
Unit Type and Rh: 5100

## 2018-03-13 LAB — HEPARIN LEVEL (UNFRACTIONATED)
HEPARIN UNFRACTIONATED: 0.35 [IU]/mL (ref 0.30–0.70)
Heparin Unfractionated: 0.28 IU/mL — ABNORMAL LOW (ref 0.30–0.70)

## 2018-03-13 MED ORDER — ENSURE ENLIVE PO LIQD
237.0000 mL | Freq: Three times a day (TID) | ORAL | Status: DC
  Administered 2018-03-13 – 2018-03-16 (×9): 237 mL via ORAL

## 2018-03-13 MED ORDER — APIXABAN 5 MG PO TABS
10.0000 mg | ORAL_TABLET | Freq: Two times a day (BID) | ORAL | Status: DC
  Administered 2018-03-13 – 2018-03-17 (×9): 10 mg via ORAL
  Filled 2018-03-13 (×11): qty 2

## 2018-03-13 MED ORDER — APIXABAN 5 MG PO TABS: 5.0000 mg | ORAL_TABLET | Freq: Two times a day (BID) | ORAL | Status: DC

## 2018-03-13 MED ORDER — TRAVASOL 10 % IV SOLN
INTRAVENOUS | Status: DC
  Filled 2018-03-13: qty 1062

## 2018-03-13 NOTE — Progress Notes (Signed)
  Speech Language Pathology Treatment: Dysphagia  Patient Details Name: Maria Avila MRN: [id number] DOB: Sep 12, 1950 Today's Date: 03/13/2018 Time: 4383-8184 SLP Time Calculation (min) (ACUTE ONLY): 10 min  Assessment / Plan / Recommendation Clinical Impression  Pt does not recall either swallow study completed on previous date, but consumes bites of puree and sips of thin liquids with only one cough noted, which occurred as the pt tried to talk while she was orally holding a bolus of puree. Suspect premature spillage. Recommend continuing current diet for now with advanced trials to be completed during future SLP visits. Would maintain aspiration and esophageal precautions.   HPI HPI: Pt is a 67 year old admitted with respiratory failure, left lung empyema s/p CT placement 10/31, and septic shock. ETT 10/31-11/4. MRI 11/6 showed multifocal, bilateral acute ischemia with largest infarction in the L parietal lobe. Additional areas include the L frontal lobe, R parietal lobe, and pons. PMH: malnutrition and unexplained FTT (s/p recent GI and psych consults), HTN, DM      SLP Plan  Continue with current plan of care       Recommendations  Diet recommendations: Dysphagia 1 (puree);Thin liquid Liquids provided via: Cup;Straw Medication Administration: Crushed with puree Supervision: Patient able to self feed;Full supervision/cueing for compensatory strategies Compensations: Slow rate;Small sips/bites Postural Changes and/or Swallow Maneuvers: Seated upright 90 degrees                Oral Care Recommendations: Oral care QID Follow up Recommendations: Skilled Nursing facility SLP Visit Diagnosis: Dysphagia, oral phase (R13.11) Plan: Continue with current plan of care       GO                Germain Osgood 03/13/2018, 11:20 AM  Germain Osgood, M.A. [place] Acute Environmental education officer [phone] Office [phone]

## 2018-03-13 NOTE — Progress Notes (Signed)
PROGRESS NOTE                                                                                                                                                                                                             Patient Demographics:    Maria Avila, is a 67 y.o. female, DOB - Dec 26, 1950, EQA:[id number]  Admit date - 03/04/2018   Admitting Physician [name], MD  Outpatient Primary MD for the patient is [name], MD  LOS - 9   Chief Complaint  Patient presents with  . Altered Mental Status       Brief Narrative    67 y.o. female with history of malnutrition, hypertension, diabetes and colitis , was admitted to ICU 03/04/2018 due to septic shock from empyema, patient with recent hospitalization letter to that significant for endoscopy with no acute findings, she remained in SNF for 4 days, then she presents with septic shock, work-up significant for empyema, requiring pressors, intubation, became off pressors, extubated, then she was noted to have moving her right side, and had left gaze preference, work-up significant for acute CVA, embolic, as well she had acute DVT, she was transferred to [facility] care 03/11/2018.  10/31 ETT placed 10/31 Chest tube placed 11/1 TPA/pulmozyme administration  11/4 extubated   Subjective:    Maria Avila today denies any complaints, flex seal was inserted yesterday, appears to be empty,   Assessment  & Plan :    Active Problems:   Severe protein-calorie malnutrition (HCC)   Empyema (HCC)   Pressure injury of skin   Acute respiratory failure with hypercapnia (HCC)   Malnutrition of moderate degree   Cerebral embolism with cerebral infarction   Chest tube in place   Esophageal perforation   Perforation esophagus   Sepsis with acute renal failure (HCC)   Acute deep vein thrombosis (DVT) of both peroneal veins   Leukocytosis   Sepsis due to Pseudomonas species ([place])  Septic shock  due to empyema -Present on admission, requiring pressure support, CT chest showing free air and large pleural effusion, status post chest tube 07/03/2017,. - intubated on admission 03/04/2018, extubated 03/09/2018 -Edema culture showing Pseudomonas, E. coli and enterococcus and pleural fluid, initially treated with Unasyn, given the fact she is been having leukocytosis, and pleural culture growing Pseudomonas, she was transitioned to [facility] 03/12/2018. -  Chest tube management per PCCM, CT chest was obtained yesterday, tube discontinued 03/13/2018 by pulmonary. -This is most likely in the setting of aspiration pneumonia, no evidence of esophageal perforation on esophgiogram, she was started on a diet after her barium swallow evaluation. -We will DC TPN after this bag is finished as she was started on oral diet  Acute CVA - Left parietal small infarcts, left MCA/ACA, right parietal and pontine punctate infarcts, -Most likely embolic pattern, cardioembolic versus DVT in the setting of PFO, versus endocarditis, versus hypercoagulable state due to severe sepsis. -on heparin for her lower extremity DVT, TCD bubble study with no PFO at rest, patient not cooperative on Valsalva test , now there is no further procedures anticipated, and she can take oral will transition to Eliquis.  Acute respiratory failure -Due to empyema and septic shock on admission, requiring intubation, currently extubated, on oxygen via nasal cannula, wean as tolerated,.  Bilateral lower extremity DVT -Lower extremity venous Doppler showing bilateral lower extremity DVT with right posterior tibial veins and left peroneal veins distally -TCD bubble study no PFO at rest, not cooperative on valsalva test -Now she can take oral, no further procedures are anticipated, will switch from heparin gtt. to Eliquis  Diabetes -Sliding scale, hemoglobin A1c is 5.1  Protein calorie malnutrition -Was on TPN, started on diet yesterday, will start  Ensure  Anemia -She had 1 unit PRBC 03/12/2018 for hemoglobin of 7.7  Code Status : partial  Family Communication  : none at bedside  Disposition Plan  : cir CONSULTED  Consults  : PCCM, CT surgery, neuurology  Procedures  :  10/31 ETT placed 10/31 Chest tube placed, removed 03/13/2018 11/1 TPA/pulmozyme administration  11/4 extubated  Left subclavian central line will be discontinued today, no need for PICC line currently  DVT Prophylaxis  :  Heparin GTT>>eliquis  Lab Results  Component Value Date   PLT 206 03/13/2018    Antibiotics  :    Anti-infectives (From admission, onward)   Start     Dose/Rate Route Frequency Ordered Stop   03/12/18 1400  piperacillin-tazobactam (ZOSYN) IVPB 3.375 g     3.375 g 12.5 mL/hr over 240 Minutes Intravenous Every 8 hours 03/12/18 1332     03/07/18 1000  Ampicillin-Sulbactam (UNASYN) 3 g in sodium chloride 0.9 % 100 mL IVPB  Status:  Discontinued     3 g 200 mL/hr over 30 Minutes Intravenous Every 8 hours 03/07/18 0948 03/12/18 1332   03/05/18 1200  vancomycin (VANCOCIN) 500 mg in sodium chloride 0.9 % 100 mL IVPB  Status:  Discontinued     500 mg 100 mL/hr over 60 Minutes Intravenous Every 24 hours 03/04/18 1801 03/07/18 0948   03/04/18 2200  piperacillin-tazobactam (ZOSYN) IVPB 3.375 g  Status:  Discontinued     3.375 g 12.5 mL/hr over 240 Minutes Intravenous Every 8 hours 03/04/18 1801 03/07/18 0948   03/04/18 0915  ceFEPIme (MAXIPIME) 2 g in sodium chloride 0.9 % 100 mL IVPB     2 g 200 mL/hr over 30 Minutes Intravenous  Once 03/04/18 0910 03/04/18 1000   03/04/18 0915  metroNIDAZOLE (FLAGYL) IVPB 500 mg  Status:  Discontinued     500 mg 100 mL/hr over 60 Minutes Intravenous Every 8 hours 03/04/18 0910 03/04/18 1801   03/04/18 0915  vancomycin (VANCOCIN) IVPB 1000 mg/200 mL premix     1,000 mg 200 mL/hr over 60 Minutes Intravenous  Once 03/04/18 0910 03/04/18 1338  Objective:   Vitals:   03/12/18 2000 03/12/18 2116  03/13/18 0341 03/13/18 0826  BP: 112/83 120/83 112/79 (!) 112/95  Pulse: (!) 104 99  100  Resp: 19 19 17 20   Temp:  98.3 F (36.8 C) 98.3 F (36.8 C) 98.2 F (36.8 C)  TempSrc:  Oral Oral Oral  SpO2: 100% 96% 100% 97%  Weight:      Height:        Wt Readings from Last 3 Encounters:  03/10/18 65.1 kg  02/16/18 48.7 kg  02/05/18 48.1 kg     Intake/Output Summary (Last 24 hours) at 03/13/2018 1313 Last data filed at 03/13/2018 0955 Gross per 24 hour  Intake 2837 ml  Output 2550 ml  Net 287 ml     Physical Exam  Awake Alert, present, communicative  symmetrical Chest wall movement, Good air movement bilaterally, diminished at the left, RRR,No Gallops,Rubs or new Murmurs, No Parasternal Heave +ve B.Sounds, Abd Soft, No tenderness, No rebound - guarding or rigidity. No Cyanosis, Clubbing or edema,  has right-sided weakness, bilateral boots  Patient  with left subclavian line, upper extremity midline, and left side chest tube Left-sided chest tube discontinued 03/13/2018,   Data Review:    CBC Recent Labs  Lab 03/07/18 0352 03/08/18 0414 03/09/18 0350 03/10/18 0451 03/11/18 0500 03/12/18 0330 03/13/18 0234  WBC 16.9* 13.1* 12.4* 14.2* 17.4* 18.0* 17.9*  HGB 8.6* 8.5* 8.2* 8.4* 8.6* 7.7* 9.5*  HCT 24.7* 24.2* 24.6* 26.0* 25.9* 23.6* 29.5*  PLT 121* 102* 122* 135* 165 173 206  MCV 88.5 88.3 90.1 90.9 92.8 94.0 92.8  MCH 30.8 31.0 30.0 29.4 30.8 30.7 29.9  MCHC 34.8 35.1 33.3 32.3 33.2 32.6 32.2  RDW 15.6* 15.6* 15.6* 15.6* 16.1* 16.3* 16.1*  LYMPHSABS 1.4 1.9 0.9 1.7  --   --   --   MONOABS 1.0 0.7 0.1 0.6  --   --   --   EOSABS 0.0 0.0 0.1 0.0  --   --   --   BASOSABS 0.0 0.0 0.0 0.0  --   --   --     Chemistries  Recent Labs  Lab 03/07/18 0352 03/08/18 0414  03/09/18 0350 03/10/18 0451 03/11/18 0500 03/12/18 0330 03/13/18 0234  NA 137 143   < > 143 139 140 138 138  K 3.5 2.6*   < > 3.1* 3.7 3.9 4.0 4.1  CL 117* 117*   < > 115* 108 110 108 111  CO2  17* 21*   < > 24 28 25 26 24   GLUCOSE 180* 149*   < > 123* 125* 80 106* 87  BUN 30* 24*   < > 19 17 14 14 17   CREATININE 1.25* 0.93   < > 0.66 0.42* 0.40* 0.39* 0.48  CALCIUM 8.1* 8.2*   < > 8.1* 7.8* 7.7* 7.6* 7.8*  MG 2.0 1.7  --  2.0  --  1.7 1.7  --   AST 51* 56*  --  55* 49* 34  --   --   ALT 37 47*  --  50* 49* 36  --   --   ALKPHOS 341* 380*  --  475* 408* 326*  --   --   BILITOT 1.3* 0.8  --  0.8 0.7 0.5  --   --    < > = values in this interval not displayed.   ------------------------------------------------------------------------------------------------------------------ No results for input(s): CHOL, HDL, LDLCALC, TRIG, CHOLHDL, LDLDIRECT in the last 72 hours.  Lab Results  Component Value Date   HGBA1C 5.1 03/10/2018   ------------------------------------------------------------------------------------------------------------------ No results for input(s): TSH, T4TOTAL, T3FREE, THYROIDAB in the last 72 hours.  Invalid input(s): FREET3 ------------------------------------------------------------------------------------------------------------------ No results for input(s): VITAMINB12, FOLATE, FERRITIN, TIBC, IRON, RETICCTPCT in the last 72 hours.  Coagulation profile Recent Labs  Lab 03/08/18 0414  INR 1.45    No results for input(s): DDIMER in the last 72 hours.  Cardiac Enzymes No results for input(s): CKMB, TROPONINI, MYOGLOBIN in the last 168 hours.  Invalid input(s): CK ------------------------------------------------------------------------------------------------------------------ No results found for: BNP  Inpatient Medications  Scheduled Meds: . chlorhexidine  15 mL Mouth Rinse BID  . Chlorhexidine Gluconate Cloth  6 each Topical Daily  . collagenase   Topical Daily  . feeding supplement (ENSURE ENLIVE)  237 mL Oral TID BM  . Gerhardt's butt cream   Topical Daily  . insulin aspart  0-9 Units Subcutaneous Q8H  . mouth rinse  15 mL Mouth Rinse  [id number]  . pantoprazole (PROTONIX) IV  40 mg Intravenous Q24H  . sodium chloride flush  10-40 mL Intracatheter Q12H   Continuous Infusions: . sodium chloride Stopped (03/04/18 2139)  . sodium chloride 75 mL/hr at 03/13/18 1027  . heparin 800 Units/hr (03/13/18 1122)  . piperacillin-tazobactam (ZOSYN)  IV 3.375 g (03/13/18 0544)   PRN Meds:.bisacodyl, sodium chloride flush  Micro Results Recent Results (from the past 240 hour(s))  Blood Culture (routine x 2)     Status: None   Collection Time: 03/04/18  9:10 AM  Result Value Ref Range Status   Specimen Description   Final    BLOOD LEFT HAND Performed at [facility] [address] [place], [place] [address]    Special Requests   Final    BOTTLES DRAWN AEROBIC AND ANAEROBIC Blood Culture results may not be optimal due to an inadequate volume of blood received in culture bottles Performed at [facility] [address], [place], [place] [address]    Culture   Final    NO GROWTH 5 DAYS Performed at [facility], [place] [address], [place], [place] [address]    Report Status 03/09/2018 FINAL  Final  MRSA PCR Screening     Status: None   Collection Time: 03/04/18  2:50 PM  Result Value Ref Range Status   MRSA by PCR NEGATIVE NEGATIVE Final    Comment:        The GeneXpert MRSA Assay (FDA approved for NASAL specimens only), is one component of a comprehensive MRSA colonization surveillance program. It is not intended to diagnose MRSA infection nor to guide or monitor treatment for MRSA infections. Performed at [facility], [place] [address] [place], [place] [address]   Culture, respiratory (tracheal aspirate)     Status: None   Collection Time: 03/04/18  3:50 PM  Result Value Ref Range Status   Specimen Description TRACHEAL ASPIRATE  Final   Special Requests Normal  Final   Gram Stain   Final    ABUNDANT WBC PRESENT, PREDOMINANTLY PMN ABUNDANT GRAM POSITIVE COCCI FEW GRAM  POSITIVE RODS FEW GRAM NEGATIVE RODS Performed at [facility], [place] [address], [place],  [address]    Culture FEW MULTIPLE ORGANISMS PRESENT, NONE PREDOMINANT  Final   Report Status 03/07/2018 FINAL  Final  Body fluid culture     Status: None   Collection Time: 03/04/18  4:32 PM  Result Value Ref Range Status   Specimen Description  FLUID PLEURAL  Final   Special Requests NONE  Final   Gram Stain   Final    ABUNDANT WBC PRESENT, PREDOMINANTLY PMN MODERATE GRAM POSITIVE COCCI IN PAIRS IN CLUSTERS FEW GRAM POSITIVE RODS FEW GRAM NEGATIVE RODS Performed at [facility], [place] [address], [place], [place] [address]    Culture   Final    RARE ESCHERICHIA COLI FEW ENTEROCOCCUS AVIUM RARE PSEUDOMONAS AERUGINOSA    Report Status 03/09/2018 FINAL  Final   Organism ID, Bacteria ESCHERICHIA COLI  Final   Organism ID, Bacteria ENTEROCOCCUS AVIUM  Final   Organism ID, Bacteria PSEUDOMONAS AERUGINOSA  Final      Susceptibility   Enterococcus avium - MIC*    AMPICILLIN <=2 SENSITIVE Sensitive     VANCOMYCIN <=0.5 SENSITIVE Sensitive     GENTAMICIN SYNERGY SENSITIVE Sensitive     * FEW ENTEROCOCCUS AVIUM   Escherichia coli - MIC*    AMPICILLIN >=32 RESISTANT Resistant     CEFAZOLIN <=4 SENSITIVE Sensitive     CEFEPIME <=1 SENSITIVE Sensitive     CEFTAZIDIME <=1 SENSITIVE Sensitive     CEFTRIAXONE <=1 SENSITIVE Sensitive     CIPROFLOXACIN <=0.25 SENSITIVE Sensitive     GENTAMICIN <=1 SENSITIVE Sensitive     IMIPENEM <=0.25 SENSITIVE Sensitive     TRIMETH/SULFA <=20 SENSITIVE Sensitive     AMPICILLIN/SULBACTAM 8 SENSITIVE Sensitive     PIP/TAZO <=4 SENSITIVE Sensitive     Extended ESBL NEGATIVE Sensitive     * RARE ESCHERICHIA COLI   Pseudomonas aeruginosa - MIC*    CEFTAZIDIME 4 SENSITIVE Sensitive     CIPROFLOXACIN <=0.25 SENSITIVE Sensitive     GENTAMICIN <=1 SENSITIVE Sensitive     IMIPENEM <=0.25 SENSITIVE Sensitive     PIP/TAZO 8 SENSITIVE Sensitive      CEFEPIME 2 SENSITIVE Sensitive     * RARE PSEUDOMONAS AERUGINOSA  Culture, blood (routine x 2)     Status: None   Collection Time: 03/04/18  5:42 PM  Result Value Ref Range Status   Specimen Description BLOOD SITE NOT SPECIFIED  Final   Special Requests   Final    BOTTLES DRAWN AEROBIC AND ANAEROBIC Blood Culture adequate volume   Culture   Final    NO GROWTH 5 DAYS Performed at [facility], [address], [place], [place] [address]    Report Status 03/09/2018 FINAL  Final  Culture, blood (routine x 2)     Status: None   Collection Time: 03/04/18  5:42 PM  Result Value Ref Range Status   Specimen Description BLOOD SITE NOT SPECIFIED  Final   Special Requests   Final    BOTTLES DRAWN AEROBIC AND ANAEROBIC Blood Culture adequate volume   Culture   Final    NO GROWTH 5 DAYS Performed at [facility], [address] [place], [place] [address]    Report Status 03/09/2018 FINAL  Final  Urine culture     Status: Abnormal   Collection Time: 03/04/18  6:47 PM  Result Value Ref Range Status   Specimen Description URINE, CATHETERIZED  Final   Special Requests   Final    Normal Performed at [facility], [place] [address] [place], [place] [address]    Culture MULTIPLE SPECIES PRESENT, SUGGEST RECOLLECTION (A)  Final   Report Status 03/05/2018 FINAL  Final    Radiology Reports Ct Angio Head W Or Wo Contrast  Result Date: 03/09/2018 CLINICAL DATA:  67 y/o  F; patient extubated yesterday. Right-sided weakness and slow speech. Increased lethargy. EXAM: CT ANGIOGRAPHY HEAD AND NECK CT PERFUSION BRAIN TECHNIQUE: Multidetector CT imaging of the head and neck was performed using the standard protocol during bolus administration of intravenous contrast. Multiplanar CT image reconstructions and MIPs were obtained to evaluate the vascular anatomy. Carotid stenosis measurements (when applicable) are obtained utilizing NASCET criteria, using the distal internal carotid diameter as the  denominator. Multiphase CT imaging of the brain was performed following IV bolus contrast injection. Subsequent parametric perfusion maps were calculated using RAPID software. CONTRAST:  90 cc Isovue 370 COMPARISON:  03/09/2018, 03/04/2018, 02/16/2018 CT head. 02/16/2018 MRI head. FINDINGS: CTA NECK FINDINGS Aortic arch: Standard branching. Imaged portion shows no evidence of aneurysm or dissection. No significant stenosis of the major arch vessel origins. Mild calcific aortic atherosclerosis. Right carotid system: No evidence of dissection, stenosis (50% or greater) or occlusion. Mild non stenotic calcific atherosclerosis of the carotid bifurcation. Left carotid system: No evidence of dissection, stenosis (50% or greater) or occlusion. Minimal non stenotic calcific atherosclerosis of the carotid bifurcation. Vertebral arteries: Left dominant, diminutive right vertebral artery. No evidence of dissection, stenosis (50% or greater) or occlusion. Skeleton: Moderate spondylosis of the cervical spine with multilevel disc and facet degenerative changes. No high-grade bony canal stenosis. Other neck: Negative. Upper chest: Negative. Review of the MIP images confirms the above findings CTA HEAD FINDINGS Anterior circulation: No significant stenosis, proximal occlusion, aneurysm, or vascular malformation. Mild non stenotic calcific atherosclerosis of the carotid siphons. Posterior circulation: No significant stenosis, proximal occlusion, aneurysm, or vascular malformation. Venous sinuses: As permitted by contrast timing, patent. Anatomic variants: Right vertebral artery largely terminates in the right PICA with diminutive contribution to the basilar. Complete circle-of-Willis. Large left A1, large anterior communicating artery, diminutive right A1, normal variant Review of the MIP images confirms the above findings CT Brain Perfusion Findings: CBF (<30%) Volume: 36mL Perfusion (Tmax>6.0s) volume: 43mL Mismatch Volume: 71mL  Infarction Location:Left parietal lobe subacute infarction visible on noncontrast CT, pseudonormalized perfusion. IMPRESSION: 1. Patent carotid and vertebral arteries. No dissection, aneurysm, or hemodynamically significant stenosis utilizing NASCET criteria. 2. Patent anterior and posterior intracranial circulation. No large vessel occlusion, aneurysm, or significant stenosis. 3. Left parietal lobe subacute infarction visible on noncontrast CT with pseudonormalized perfusion. 4. No perfusion anomaly to suggest interval acute infarct. Electronically Signed   By: Kristine Garbe M.D.   On: 03/09/2018 18:44   Ct Head Wo Contrast  Result Date: 03/09/2018 CLINICAL DATA:  Residual right-sided weakness. EXAM: CT HEAD WITHOUT CONTRAST TECHNIQUE: Contiguous axial images were obtained from the base of the skull through the vertex without intravenous contrast. COMPARISON:  03/04/2018 CT.  02/16/2018 MRI. FINDINGS: Brain: There is acute to subacute infarction in the left parietal lobe consistent with left MCA branch vessel infarction. The brain shows low-density with mild swelling. No hemorrhage. No other abnormal brain finding. No mass, hydrocephalus or extra-axial collection. Vascular: There is atherosclerotic calcification of the major vessels at the base of the brain. Skull: Negative Sinuses/Orbits: Clear/normal Other: None IMPRESSION: Acute/subacute infarction in the left parietal lobe consistent with MCA branch vessel infarction. No evidence of hemorrhage or mass effect. Electronically Signed   By: Nelson Chimes M.D.   On: 03/09/2018 13:31   Ct Head Wo Contrast  Result Date: 03/04/2018 CLINICAL DATA:  Altered level of consciousness EXAM: CT HEAD WITHOUT CONTRAST TECHNIQUE: Contiguous axial images were obtained from the base of the skull through the vertex without intravenous contrast. COMPARISON:  02/16/2018 FINDINGS: Brain: No evidence of acute infarction, hemorrhage, hydrocephalus, extra-axial  collection or mass lesion/mass effect. Vascular: No hyperdense vessel or unexpected calcification. Skull: Normal. Negative for fracture or focal lesion. Sinuses/Orbits: No acute finding. Other: None. IMPRESSION: No acute abnormality noted.  No change from the prior exam. Electronically Signed   By: Inez Catalina M.D.   On: 03/04/2018 09:33   Ct Head Wo Contrast  Result Date: 02/16/2018 CLINICAL DATA:  Generalize weakness and slurred speech for 2 weeks. EXAM: CT HEAD WITHOUT CONTRAST TECHNIQUE: Contiguous axial images were obtained from the base of the skull through the vertex without intravenous contrast. COMPARISON:  04/01/2005 FINDINGS: Brain: No evidence of acute infarction, hemorrhage, hydrocephalus, extra-axial collection or mass lesion/mass effect. Vascular: Mild intracranial arterial vascular calcifications. Skull: Calvarium appears intact. Sinuses/Orbits: Paranasal sinuses and mastoid air cells are clear. Other: None. IMPRESSION: No acute intracranial abnormality. Electronically Signed   By: Lucienne Capers M.D.   On: 02/16/2018 06:24   Ct Angio Neck W Or Wo Contrast  Result Date: 03/09/2018 CLINICAL DATA:  67 y/o F; patient extubated yesterday. Right-sided weakness and slow speech. Increased lethargy. EXAM: CT ANGIOGRAPHY HEAD AND NECK CT PERFUSION BRAIN TECHNIQUE: Multidetector CT imaging of the head and neck was performed using the standard protocol during bolus administration of intravenous contrast. Multiplanar CT image reconstructions and MIPs were obtained to evaluate the vascular anatomy. Carotid stenosis measurements (when applicable) are obtained utilizing NASCET criteria, using the distal internal carotid diameter as the denominator. Multiphase CT imaging of the brain was performed following IV bolus contrast injection. Subsequent parametric perfusion maps were calculated using RAPID software. CONTRAST:  90 cc Isovue 370 COMPARISON:  03/09/2018, 03/04/2018, 02/16/2018 CT head. 02/16/2018  MRI head. FINDINGS: CTA NECK FINDINGS Aortic arch: Standard branching. Imaged portion shows no evidence of aneurysm or dissection. No significant stenosis of the major arch vessel origins. Mild calcific aortic atherosclerosis. Right carotid system: No evidence of dissection, stenosis (50% or greater) or occlusion. Mild non stenotic calcific atherosclerosis of the carotid bifurcation. Left carotid system: No evidence of dissection, stenosis (50% or greater) or occlusion. Minimal non stenotic calcific atherosclerosis of the carotid bifurcation. Vertebral arteries: Left dominant, diminutive right vertebral artery. No evidence of dissection, stenosis (50% or greater) or occlusion. Skeleton: Moderate spondylosis of the cervical spine with multilevel disc and facet degenerative changes. No high-grade bony canal stenosis. Other neck: Negative. Upper chest: Negative. Review of the MIP images confirms the above findings CTA HEAD FINDINGS Anterior circulation: No significant stenosis, proximal occlusion, aneurysm, or vascular malformation. Mild non stenotic calcific atherosclerosis of the carotid siphons. Posterior circulation: No significant stenosis, proximal occlusion, aneurysm, or vascular malformation. Venous sinuses: As permitted by contrast timing, patent. Anatomic variants: Right vertebral artery largely terminates in the right PICA with diminutive contribution to the basilar. Complete circle-of-Willis. Large left A1, large anterior communicating artery, diminutive right A1, normal variant Review of the MIP images confirms the above findings CT Brain Perfusion Findings: CBF (<30%) Volume: 69mL Perfusion (Tmax>6.0s) volume: 45mL Mismatch Volume: 60mL Infarction Location:Left parietal lobe subacute infarction visible on noncontrast CT, pseudonormalized perfusion. IMPRESSION: 1. Patent carotid and vertebral arteries. No dissection, aneurysm, or hemodynamically significant stenosis utilizing NASCET criteria. 2. Patent  anterior and posterior intracranial circulation. No large vessel occlusion, aneurysm, or significant stenosis. 3. Left parietal lobe subacute infarction visible on noncontrast CT with pseudonormalized perfusion. 4. No perfusion anomaly to suggest interval acute infarct. Electronically Signed   By: Kristine Garbe M.D.   On: 03/09/2018 18:44  Ct Chest Wo Contrast  Result Date: 03/12/2018 CLINICAL DATA:  Assess empyema. EXAM: CT CHEST WITHOUT CONTRAST TECHNIQUE: Multidetector CT imaging of the chest was performed following the standard protocol without IV contrast. COMPARISON:  Chest radiograph performed 03/08/2018, and CT of the chest performed 03/04/2018 FINDINGS: Cardiovascular: Scattered coronary artery calcifications are seen. The heart is normal in size. The patient's left subclavian line is noted at the distal SVC. Mediastinum/Nodes: No pericardial effusion is identified. The thyroid gland is unremarkable in appearance. No mediastinal lymphadenopathy is seen. No axillary lymphadenopathy is appreciated. Lungs/Pleura: There has been placement of a left apical chest tube, with decrease in the size of the left-sided pleural effusion. Residual small bilateral pleural effusions are noted, somewhat loculated on the left. A new focus of increased attenuation is noted within the left-sided pleural effusion, which may reflect trace hemorrhage. No definite parenchymal necrosis is seen. There is collapse of much of the left lower lung lobe. Mild right-sided atelectasis is noted. The expanded portions of both lungs are grossly clear. No definite pneumothorax is seen. Slight irregularity in the contour of the proximal trachea may reflect sequelae of prior instrumentation. Upper Abdomen: A small amount of ascites is noted tracking about the liver. The visualized portions of the liver and spleen are otherwise unremarkable. Musculoskeletal: No acute osseous abnormalities are identified. The visualized musculature  is unremarkable in appearance. IMPRESSION: 1. Placement of left apical chest tube, with decrease in the size of the left-sided pleural effusion. Residual small bilateral pleural effusions, somewhat loculated on the left. Empyema cannot be excluded, given clinical concern. 2. New focus of increased attenuation within the left-sided pleural effusion may reflect trace hemorrhage. No definite evidence of parenchymal necrosis. 3. Collapse of much of the left lower lung lobe. Mild right-sided atelectasis. 4. Scattered coronary artery calcifications seen. 5. Small amount of ascites noted tracking about the liver. Electronically Signed   By: Garald Balding M.D.   On: 03/12/2018 23:00   Ct Chest Wo Contrast  Result Date: 03/04/2018 CLINICAL DATA:  Shortness of breath, pneumonia EXAM: CT CHEST WITHOUT CONTRAST TECHNIQUE: Multidetector CT imaging of the chest was performed following the standard protocol without IV contrast. COMPARISON:  Chest x-ray 03/04/2018 FINDINGS: Cardiovascular: Heart is normal size. Aorta is normal caliber with scattered aortic calcifications. Moderate coronary artery calcifications in the left anterior descending coronary artery. Mediastinum/Nodes: Reactive borderline sized mediastinal lymph nodes. Lungs/Pleura: Complex large left pleural effusions with loculation and pleural gas. Cannot exclude bronchopleural fistula or empyema. Collapse of much of the left lung with only a small amount of aerated left upper lobe. Nodular airspace opacities in the right upper lobe and superior segment of the right lower lobe with trace right pleural effusion. Upper Abdomen: Imaging into the upper abdomen shows no acute findings. Musculoskeletal: Chest wall soft tissues are unremarkable. Degenerative changes in the thoracic spine. No acute bony abnormality. IMPRESSION: Large complex left pleural effusion with pleural gas and extensive loculations. This could reflect bronchopleural fistula or empyema. Collapse of  much of the left lung with only a small amount of aerated left upper lobe. Nodular ground-glass airspace opacities centrally in the right upper lobe and in the superior segment of the right lower lobe, likely infectious/inflammatory. Coronary artery disease, aortic atherosclerosis. Electronically Signed   By: Rolm Baptise M.D.   On: 03/04/2018 11:39   Mr Brain Wo Contrast  Result Date: 03/10/2018 CLINICAL DATA:  Stroke follow-up EXAM: MRI HEAD WITHOUT CONTRAST TECHNIQUE: Multiplanar, multiecho pulse sequences of the brain and  surrounding structures were obtained without intravenous contrast. COMPARISON:  Brain MRI 02/16/2018 FINDINGS: BRAIN: There are multiple foci of abnormal diffusion restriction, the largest of which is located in the left parietal lobe. Other foci are located in the posterior left frontal lobe, right parietal lobe and the pons. There is no midline shift or mass effect. Mild cytotoxic edema of the left parietal lobe. Minimal white matter hyperintensity for age. Brain parenchyma is otherwise normal. The cerebral and cerebellar volume are age-appropriate. Susceptibility-sensitive sequences show no chronic microhemorrhage or superficial siderosis. VASCULAR: Major intracranial arterial and venous sinus flow voids are normal. SKULL AND UPPER CERVICAL SPINE: Calvarial bone marrow signal is normal. There is no skull base mass. Visualized upper cervical spine and soft tissues are normal. SINUSES/ORBITS: No fluid levels or advanced mucosal thickening. No mastoid or middle ear effusion. The orbits are normal. IMPRESSION: 1. Multifocal acute ischemia, spread throughout multiple bilateral vascular territories. 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 2. The largest infarction is in the left parietal lobe. Other sites include the posterior left frontal lobe, right parietal lobe and pons. 3. No hemorrhage or mass effect. Electronically Signed   By: Ulyses Jarred M.D.   On: 03/10/2018 06:17   Mr  Brain Wo Contrast  Result Date: 02/16/2018 CLINICAL DATA:  Weight loss and weakness. EXAM: MRI HEAD WITHOUT CONTRAST TECHNIQUE: Multiplanar, multiecho pulse sequences of the brain and surrounding structures were obtained without intravenous contrast. COMPARISON:  CT same day FINDINGS: Brain: Generalized atrophy. Diffusion imaging does not show any acute or subacute infarction. The brainstem and cerebellum are normal. Mild chronic small-vessel ischemic change of the cerebral hemispheric deep white matter. No cortical or large vessel territory infarction. No mass lesion, hemorrhage, hydrocephalus or extra-axial collection. Vascular: Major vessels at the base of the brain show flow. Skull and upper cervical spine: Negative Sinuses/Orbits: Clear/normal Other: None IMPRESSION: No acute or reversible finding. Generalized atrophy. Mild chronic small-vessel ischemic change of the cerebral hemispheric white matter. Electronically Signed   By: Nelson Chimes M.D.   On: 02/16/2018 21:52   Nm Hepato W/eject Fract  Result Date: 02/18/2018 CLINICAL DATA:  Weight loss. EXAM: NUCLEAR MEDICINE HEPATOBILIARY IMAGING WITH GALLBLADDER EF TECHNIQUE: Sequential images of the abdomen were obtained out to 60 minutes following intravenous administration of radiopharmaceutical. After oral ingestion of Ensure, gallbladder ejection fraction was determined. At 60 min, normal ejection fraction is greater than 33%. RADIOPHARMACEUTICALS:  5.3 mCi Tc-76m  Choletec IV COMPARISON:  None. FINDINGS: Prompt uptake and biliary excretion of activity by the liver is seen. Gallbladder activity is visualized, consistent with patency of cystic duct. Biliary activity passes into small bowel, consistent with patent common bile duct. The gallbladder emptied spontaneously before the Ensure. As a result, an ejection fraction cannot be accurately calculated. That being said, the gallbladder is not visualized after ensure and gallbladder emptying is  near-complete. IMPRESSION: The gallbladder emptied spontaneously before consumption of Ensure. While an ejection fraction cannot be calculated, gallbladder emptying is near complete. Electronically Signed   By: Dorise Bullion III M.D   On: 02/18/2018 13:40   Ct Cerebral Perfusion W Contrast  Result Date: 03/09/2018 CLINICAL DATA:  67 y/o F; patient extubated yesterday. Right-sided weakness and slow speech. Increased lethargy. EXAM: CT ANGIOGRAPHY HEAD AND NECK CT PERFUSION BRAIN TECHNIQUE: Multidetector CT imaging of the head and neck was performed using the standard protocol during bolus administration of intravenous contrast. Multiplanar CT image reconstructions and MIPs were obtained to evaluate the vascular anatomy. Carotid  stenosis measurements (when applicable) are obtained utilizing NASCET criteria, using the distal internal carotid diameter as the denominator. Multiphase CT imaging of the brain was performed following IV bolus contrast injection. Subsequent parametric perfusion maps were calculated using RAPID software. CONTRAST:  90 cc Isovue 370 COMPARISON:  03/09/2018, 03/04/2018, 02/16/2018 CT head. 02/16/2018 MRI head. FINDINGS: CTA NECK FINDINGS Aortic arch: Standard branching. Imaged portion shows no evidence of aneurysm or dissection. No significant stenosis of the major arch vessel origins. Mild calcific aortic atherosclerosis. Right carotid system: No evidence of dissection, stenosis (50% or greater) or occlusion. Mild non stenotic calcific atherosclerosis of the carotid bifurcation. Left carotid system: No evidence of dissection, stenosis (50% or greater) or occlusion. Minimal non stenotic calcific atherosclerosis of the carotid bifurcation. Vertebral arteries: Left dominant, diminutive right vertebral artery. No evidence of dissection, stenosis (50% or greater) or occlusion. Skeleton: Moderate spondylosis of the cervical spine with multilevel disc and facet degenerative changes. No  high-grade bony canal stenosis. Other neck: Negative. Upper chest: Negative. Review of the MIP images confirms the above findings CTA HEAD FINDINGS Anterior circulation: No significant stenosis, proximal occlusion, aneurysm, or vascular malformation. Mild non stenotic calcific atherosclerosis of the carotid siphons. Posterior circulation: No significant stenosis, proximal occlusion, aneurysm, or vascular malformation. Venous sinuses: As permitted by contrast timing, patent. Anatomic variants: Right vertebral artery largely terminates in the right PICA with diminutive contribution to the basilar. Complete circle-of-Willis. Large left A1, large anterior communicating artery, diminutive right A1, normal variant Review of the MIP images confirms the above findings CT Brain Perfusion Findings: CBF (<30%) Volume: 23mL Perfusion (Tmax>6.0s) volume: 78mL Mismatch Volume: 74mL Infarction Location:Left parietal lobe subacute infarction visible on noncontrast CT, pseudonormalized perfusion. IMPRESSION: 1. Patent carotid and vertebral arteries. No dissection, aneurysm, or hemodynamically significant stenosis utilizing NASCET criteria. 2. Patent anterior and posterior intracranial circulation. No large vessel occlusion, aneurysm, or significant stenosis. 3. Left parietal lobe subacute infarction visible on noncontrast CT with pseudonormalized perfusion. 4. No perfusion anomaly to suggest interval acute infarct. Electronically Signed   By: Kristine Garbe M.D.   On: 03/09/2018 18:44   Dg Chest Port 1 View  Result Date: 03/08/2018 CLINICAL DATA:  67 year old female with empyema. Subsequent encounter. EXAM: PORTABLE CHEST 1 VIEW COMPARISON:  03/06/2018 chest x-ray and 03/04/2018 chest CT. FINDINGS: Left-sided chest tube is in place. Left chest wall subcutaneous emphysema once again noted. Questionable tiny left apical pneumothorax versus overlapping structures. Persistent consolidation left lung base. This may represent  residua of empyema and underlying atelectasis or infiltrate. The gas containing component of empyema noted on recent chest x-ray and prior CT are not well delineated on the present exam. Endotracheal tube tip 3.4 cm above the carina. Left central line tip distal superior vena cava level. Nasogastric tube courses below the diaphragm. Tip is not included on the present exam. Heart size top-normal slightly enlarged.  Calcified aorta. IMPRESSION: 1. Left-sided chest tube is in place. Left chest wall subcutaneous emphysema once again noted. 2. Questionable tiny left apical pneumothorax versus overlapping structures. 3. Persistent consolidation left lung base. This may represent residua of empyema and underlying atelectasis or infiltrate. The gas containing component of empyema noted on recent chest x-ray and prior CT are not well delineated on the present exam. Electronically Signed   By: Genia Del M.D.   On: 03/08/2018 07:18   Dg Chest Port 1 View  Result Date: 03/06/2018 CLINICAL DATA:  Empyema with HCC EXAM: PORTABLE CHEST 1 VIEW COMPARISON:  05/13/2009 FINDINGS: endotracheal  tube, NG tube, and central venous line unchanged. LEFT chest tube in place with LEFT basilar atelectasis and consolidation. Shallow pneumothorax along the lateral chest wall adjacent to the along the tract of the chest tube. Small amount subpulmonic gas additionally. Extensive subcutaneous gas along the LEFT chest wall associated with chest tube. RIGHT lung is clear. IMPRESSION: 1. Small pneumothorax in the lateral LEFT hemithorax adjacent to the chest tube. 2. LEFT basilar atelectasis and consolidation. 3. Stable support apparatus. Electronically Signed   By: Suzy Bouchard M.D.   On: 03/06/2018 08:23   Dg Chest Port 1 View  Result Date: 03/05/2018 CLINICAL DATA:  ET tube and chest tube present, respiratory failure. EXAM: PORTABLE CHEST 1 VIEW COMPARISON:  03/04/2018. FINDINGS: Support tubes and lines remain stable. LEFT chest tube  redemonstrated, with slight improved basilar, apical, and lateral pneumothorax. Overall pneumothorax estimated 10% or prep slightly greater. RIGHT lung clear. RIGHT base volume loss with effusion. Cardiomediastinal silhouette unchanged. Subcutaneous air is increased. IMPRESSION: Slight decrease LEFT pneumothorax, with significant residual components throughout the hemithorax. Increased subcutaneous air. Electronically Signed   By: Staci Righter M.D.   On: 03/05/2018 09:10   Dg Chest Port 1 View  Result Date: 03/04/2018 CLINICAL DATA:  Endotracheal tube, nasogastric tube and chest tube placement. EXAM: PORTABLE CHEST 1 VIEW COMPARISON:  Chest x-ray from earlier same day. FINDINGS: LEFT-sided chest tube in place. Improved aeration of the LEFT hemithorax. Persistent pneumothorax at the LEFT lung base, moderate in size, with associated overlying airspace collapse. Small component of the pneumothorax seen at the LEFT lung apex. Dense opacity overlying the LEFT heart border is also compatible with atelectasis. RIGHT lung is clear. Heart size and mediastinal contours are within normal limits. Endotracheal tube appears well positioned with tip approximately 3 cm above the carina. Enteric tube passes below the diaphragm. LEFT subclavian central line appears well positioned with tip at the level of the lower SVC/cavoatrial junction. IMPRESSION: 1. Support apparatus appears appropriately positioned, as detailed above. 2. Significantly improved aeration of the LEFT hemithorax status post LEFT-sided chest tube placement. Persistent pneumothorax on the LEFT, moderate in size, main component at the LEFT lung base with associated perihilar airspace collapse. Electronically Signed   By: Franki Cabot M.D.   On: 03/04/2018 17:26   Dg Chest Port 1 View  Result Date: 03/04/2018 CLINICAL DATA:  67 year old female with altered mental status, not responding. EXAM: PORTABLE CHEST 1 VIEW COMPARISON:  Portable chest 02/22/2018.  FINDINGS: Portable AP upright view at 0854 hours. Near complete opacification of the left hemithorax where as only mild left lung base opacity was present 10 days ago. No shift of the mediastinum. The right lung remains clear allowing for portable technique. Visualized tracheal air column is within normal limits. No pneumothorax. Paucity bowel gas in the upper abdomen. No acute osseous abnormality identified. IMPRESSION: New subtotal opacification of the left hemithorax is nonspecific but suspicious for combination of left lung consolidation and pleural effusion. The right lung remains clear. Electronically Signed   By: Genevie Ann M.D.   On: 03/04/2018 09:41   Dg Chest Port 1 View  Result Date: 02/22/2018 CLINICAL DATA:  Leukocytosis. EXAM: PORTABLE CHEST 1 VIEW COMPARISON:  None. FINDINGS: Subtle asymmetric prominence of the LEFT perihilar shadow, suspicious for perihilar pneumonia and/or lymphadenopathy. Subtle opacity at the LEFT lung base, compatible with pneumonia or mild atelectasis. RIGHT lung is clear. Osseous structures about the chest are unremarkable. IMPRESSION: 1. Suspected LEFT perihilar pneumonia and/or lymphadenopathy. Consider chest CT  for confirmation. 2. Subtle opacity at the LEFT lung base which could represent pneumonia, atelectasis and/or small pleural effusion. Electronically Signed   By: Franki Cabot M.D.   On: 02/22/2018 16:10   Dg Swallowing Func-speech Pathology  Result Date: 03/12/2018 Objective Swallowing Evaluation: Type of Study: MBS-Modified Barium Swallow Study  Patient Details Name: Maria Avila MRN: [id number] Date of Birth: 09/02/1950 Today's Date: 03/12/2018 Time: SLP Start Time (ACUTE ONLY): 1629 -SLP Stop Time (ACUTE ONLY): 1655 SLP Time Calculation (min) (ACUTE ONLY): 26 min Past Medical History: Past Medical History: Diagnosis Date . Colitis  . Diabetes mellitus without complication ([place])  . Hypertension  . Malnutrition ([place])  Past Surgical History: Past Surgical  History: Procedure Laterality Date . BIOPSY  02/26/2018  Procedure: BIOPSY;  Surgeon: Wilford Corner, MD;  Location: [facility];  Service: Endoscopy;; . COLONOSCOPY WITH PROPOFOL N/A 02/26/2018  Procedure: COLONOSCOPY WITH PROPOFOL;  Surgeon: Wilford Corner, MD;  Location: [facility];  Service: Endoscopy;  Laterality: N/A; . ESOPHAGOGASTRODUODENOSCOPY (EGD) WITH PROPOFOL N/A 02/26/2018  Procedure: ESOPHAGOGASTRODUODENOSCOPY (EGD) WITH PROPOFOL;  Surgeon: Wilford Corner, MD;  Location: [facility];  Service: Endoscopy;  Laterality: N/A; . NO PAST SURGERIES    UNCERTAIN OF NAME OF SURGERY HPI: Pt is a 67 year old admitted with respiratory failure, left lung empyema s/p CT placement 10/31, and septic shock. ETT 10/31-11/4. MRI 11/6 showed multifocal, bilateral acute ischemia with largest infarction in the L parietal lobe. Additional areas include the L frontal lobe, R parietal lobe, and pons. PMH: malnutrition and unexplained FTT (s/p recent GI and psych consults), HTN, DM  Subjective: pt alert but confused Assessment / Plan / Recommendation CHL IP CLINICAL IMPRESSIONS 03/12/2018 Clinical Impression Pt has a mild-moderate oral dysphagia that is felt to be at least in part due to cognitive status. She has reduced bolus cohesion, slow posterior transit, premature spillage particularly wtih thin liquids, and lingual residue. Occasional oral holding was noted, requiring only Min cues from SLP. With extra time and use of additional swallows she is able to sufficiently clear her oral cavity, but it is cumbersome. Her pharyngeal phase is timely and strong with good airway protection and clearance. Recommend starting Dys 1 diet and thin liquids with full supervision. SLP to f/u clinically for readiness to advance solids. Will also f/u for completion of speech/language evaluation. SLP Visit Diagnosis Dysphagia, oral phase (R13.11) Attention and concentration deficit following -- Frontal lobe and executive function  deficit following -- Impact on safety and function Mild aspiration risk   CHL IP TREATMENT RECOMMENDATION 03/12/2018 Treatment Recommendations Therapy as outlined in treatment plan below   Prognosis 03/12/2018 Prognosis for Safe Diet Advancement Good Barriers to Reach Goals Cognitive deficits Barriers/Prognosis Comment -- [facility] IP DIET RECOMMENDATION 03/12/2018 SLP Diet Recommendations Dysphagia 1 (Puree) solids;Thin liquid Liquid Administration via Straw;Cup Medication Administration Crushed with puree Compensations Slow rate;Small sips/bites Postural Changes Seated upright at 90 degrees   CHL IP OTHER RECOMMENDATIONS 03/12/2018 Recommended Consults -- Oral Care Recommendations Oral care BID Other Recommendations Have oral suction available   CHL IP FOLLOW UP RECOMMENDATIONS 03/12/2018 Follow up Recommendations Skilled Nursing facility   [facility] IP FREQUENCY AND DURATION 03/12/2018 Speech Therapy Frequency (ACUTE ONLY) min 2x/week Treatment Duration 2 weeks      CHL IP ORAL PHASE 03/12/2018 Oral Phase Impaired Oral - Pudding Teaspoon -- Oral - Pudding Cup -- Oral - Honey Teaspoon -- Oral - Honey Cup -- Oral - Nectar Teaspoon -- Oral - Nectar Cup -- Oral - Nectar Straw -- Oral -  Thin Teaspoon -- Oral - Thin Cup Holding of bolus;Lingual/palatal residue;Delayed oral transit;Decreased bolus cohesion;Premature spillage Oral - Thin Straw Holding of bolus;Lingual/palatal residue;Delayed oral transit;Decreased bolus cohesion;Premature spillage Oral - Puree Holding of bolus;Lingual/palatal residue;Delayed oral transit;Decreased bolus cohesion;Premature spillage Oral - Mech Soft -- Oral - Regular -- Oral - Multi-Consistency -- Oral - Pill -- Oral Phase - Comment --  CHL IP PHARYNGEAL PHASE 03/12/2018 Pharyngeal Phase WFL Pharyngeal- Pudding Teaspoon -- Pharyngeal -- Pharyngeal- Pudding Cup -- Pharyngeal -- Pharyngeal- Honey Teaspoon -- Pharyngeal -- Pharyngeal- Honey Cup -- Pharyngeal -- Pharyngeal- Nectar Teaspoon -- Pharyngeal --  Pharyngeal- Nectar Cup -- Pharyngeal -- Pharyngeal- Nectar Straw -- Pharyngeal -- Pharyngeal- Thin Teaspoon -- Pharyngeal -- Pharyngeal- Thin Cup -- Pharyngeal -- Pharyngeal- Thin Straw -- Pharyngeal -- Pharyngeal- Puree -- Pharyngeal -- Pharyngeal- Mechanical Soft -- Pharyngeal -- Pharyngeal- Regular -- Pharyngeal -- Pharyngeal- Multi-consistency -- Pharyngeal -- Pharyngeal- Pill -- Pharyngeal -- Pharyngeal Comment --  CHL IP CERVICAL ESOPHAGEAL PHASE 03/12/2018 Cervical Esophageal Phase WFL Pudding Teaspoon -- Pudding Cup -- Honey Teaspoon -- Honey Cup -- Nectar Teaspoon -- Nectar Cup -- Nectar Straw -- Thin Teaspoon -- Thin Cup -- Thin Straw -- Puree -- Mechanical Soft -- Regular -- Multi-consistency -- Pill -- Cervical Esophageal Comment -- Germain Osgood 03/12/2018, 5:23 PM  Germain Osgood, M.A. CCC-SLP Acute Rehabilitation Services Pager [phone] Office [phone]             Vas [place] Transcranial Doppler W Bubbles  Result Date: 03/11/2018  Transcranial Doppler with Bubble Indications: Stroke. Performing Technologist: Landry Mellow RDMS, RVT  Examination Guidelines: A complete evaluation includes B-mode imaging, spectral Doppler, color Doppler, and power Doppler as needed of all accessible portions of each vessel. Bilateral testing is considered an integral part of a complete examination. Limited examinations for reoccurring indications may be performed as noted.  Summary:  A vascular evaluation was performed. The right middle cerebral artery was studied. An IV was inserted into the patient's right upper arm PICC line. Verbal informed consent was obtained.  Limited study due to poor patient cooperation, unable to perform Valsalva. No HITS heard at rest. No evidence of PFO with this test. Negative TCD Bubble study *See table(s) above for measurements and observations.  Diagnosing physician: Antony Contras MD Electronically signed by Antony Contras MD on 03/11/2018 at 3:51:31 PM.    Final    Vas [place] Lower  Extremity Venous (dvt)  Result Date: 03/10/2018  Lower Venous Study Indications: Stroke, and embolic stroke.  Limitations: Body habitus, bandages, line and adjecent arterial cacification shadowing,severe edema and immobility. Performing Technologist: Lorina Rabon  Examination Guidelines: A complete evaluation includes B-mode imaging, spectral Doppler, color Doppler, and power Doppler as needed of all accessible portions of each vessel. Bilateral testing is considered an integral part of a complete examination. Limited examinations for reoccurring indications may be performed as noted.  Right Venous Findings: +---------+---------------+---------+-----------+----------+-------------------+          CompressibilityPhasicitySpontaneityPropertiesSummary             +---------+---------------+---------+-----------+----------+-------------------+ CFV      Full           Yes      Yes                                      +---------+---------------+---------+-----------+----------+-------------------+ SFJ      Full                                                             +---------+---------------+---------+-----------+----------+-------------------+  FV Prox  Full                                                             +---------+---------------+---------+-----------+----------+-------------------+ FV Mid   Full                                                             +---------+---------------+---------+-----------+----------+-------------------+ FV DistalFull                                                             +---------+---------------+---------+-----------+----------+-------------------+ PFV      Full                                                             +---------+---------------+---------+-----------+----------+-------------------+ POP      Full           Yes      Yes                                       +---------+---------------+---------+-----------+----------+-------------------+ PTV      None                    No                   Acute               +---------+---------------+---------+-----------+----------+-------------------+ PERO                                                  not well visualized +---------+---------------+---------+-----------+----------+-------------------+  Left Venous Findings: +---------+---------------+---------+-----------+----------+-------------------+          CompressibilityPhasicitySpontaneityPropertiesSummary             +---------+---------------+---------+-----------+----------+-------------------+ CFV      Full           Yes      Yes                                      +---------+---------------+---------+-----------+----------+-------------------+ SFJ      Full                                                             +---------+---------------+---------+-----------+----------+-------------------+ FV Prox  Full                                                             +---------+---------------+---------+-----------+----------+-------------------+ FV Mid   Full                                                             +---------+---------------+---------+-----------+----------+-------------------+ FV DistalFull                                                             +---------+---------------+---------+-----------+----------+-------------------+ PFV      Full                                                             +---------+---------------+---------+-----------+----------+-------------------+ POP      Full           Yes      Yes                                      +---------+---------------+---------+-----------+----------+-------------------+ PTV                                                   not well visualized  +---------+---------------+---------+-----------+----------+-------------------+ PERO     None                    No                   Acute               +---------+---------------+---------+-----------+----------+-------------------+    Summary: Right: Findings consistent with acute deep vein thrombosis involving the right posterior tibial vein. No cystic structure found in the popliteal fossa. Left: Findings consistent with acute deep vein thrombosis involving the left peroneal vein. No cystic structure found in the popliteal fossa.  *See table(s) above for measurements and observations. Electronically signed by Curt Jews MD on 03/10/2018 at 2:11:56 PM.    Final    [place] Ekg Site Rite  Result Date: 03/12/2018 If Site Rite image not attached, placement could not be confirmed due to current cardiac rhythm.  Dg Esophagus W/water Sol Cm  Result Date: 03/12/2018 CLINICAL DATA:  67 year old female with clinical concern for possible esophageal perforation. EXAM: ESOPHOGRAM/BARIUM SWALLOW TECHNIQUE: Single contrast examination was performed using thin barium or water soluble. FLUOROSCOPY TIME:  Fluoroscopy Time:  1 minutes and 24 seconds Radiation Exposure Index (if provided by the fluoroscopic device): 16.5 mGy COMPARISON:  None. FINDINGS: Limited single contrast  esophagram with water-soluble contrast material (Omnipaque 300) performed. Failure to fully propagate any normal primary peristaltic waves. Extensive tertiary contractions. No gross evidence of esophageal mass, stricture or esophageal ring. No hiatal hernia. No extravasation of contrast material from the lumen of the esophagus at any time during the examination. IMPRESSION: 1. No findings to suggest esophageal perforation. 2. Nonspecific esophageal motility disorder with severe tertiary contractions. Electronically Signed   By: Vinnie Langton M.D.   On: 03/12/2018 10:55     Phillips Climes M.D on 03/13/2018 at 1:13 PM  Between 7am to 7pm -  Pager - [phone]  After 7pm go to [email or web address] - password [email or web address]  [facility] -  Office  [phone]

## 2018-03-13 NOTE — Progress Notes (Signed)
ANTICOAGULATION CONSULT NOTE - Follow Up  Pharmacy Consult:  Heparin >> apioxaban Indication: bilateral LE  DVT   Allergies  Allergen Reactions  . Crestor [Rosuvastatin Calcium] Swelling and Other (See Comments)    Swelling of legs and muscle weakness  . Zocor [Simvastatin] Swelling and Other (See Comments)    Swelling of legs and muscle weakness    Patient Measurements: Height: 5\' 2"  (157.5 cm) Weight: (bed scales is no-operational/nurse is aware ) IBW/kg (Calculated) : 50.1 HEPARIN DW (KG): 63.4  Vital Signs: Temp: 98.2 F (36.8 C) (11/09 0826) Temp Source: Oral (11/09 0826) BP: 112/95 (11/09 0826) Pulse Rate: 100 (11/09 0826)  Labs: Recent Labs    03/11/18 0500 03/12/18 0330 03/13/18 0234 03/13/18 1219  HGB 8.6* 7.7* 9.5*  --   HCT 25.9* 23.6* 29.5*  --   PLT 165 173 206  --   HEPARINUNFRC 0.49 0.31 0.28* 0.35  CREATININE 0.40* 0.39* 0.48  --     Estimated Creatinine Clearance: 59.6 mL/min (by C-G formula based on SCr of 0.48 mg/dL).   Assessment: Maria Avila is a 87y old female presented with altered mental status from SNF, found to have empyema. Pharmacy now consulted to dose heparin in setting of bilateral LE DVTs. 11/6 MRI: Multifocal acute ischemia likely from embolic disease. Affected sites are left parietal lobe, right parietal love, and posterior left frontal lobe. Will be conservative in dosing. No bolus.  Pt to transition to apixaban per pharmacy.   Goal of Therapy:  Heparin level 0.3-0.5 units/mL Monitor platelets by anticoagulation protocol: Yes   Plan:  Stop heparin Begin apixaban 10mg  BID x7 days and reduce to 5mg  BID on 11/16 Pharmacy will sign off, reconsult if needed  Arrie Senate, PharmD, BCPS Clinical Pharmacist [phone] Please check AMION for all [place] numbers 03/13/2018

## 2018-03-13 NOTE — Progress Notes (Addendum)
NAME:  Maria Avila, MRN:  [id number], DOB:  1951/02/24, LOS: 9 ADMISSION DATE:  03/04/2018, CONSULTATION DATE:  03/04/2018 REFERRING MD:  Dr. Vanita Panda, CHIEF COMPLAINT:  AMS  Brief History   67 year old female presenting from [facility] for 2 day h/o of less responsive than baseline. Large loculated L pleural effusion, s/p CT on 10/31. Off pressors 11/3.  Past Medical History  DMT2, HTN, unexplained FTT despite extensive work-up. Significant Hospital Events   10/31 admitted, CT placed, CVTS consulted 11/3 weaned off pressors  Consults: date of consult/date signed off & final recs:  CVTS: 10/31 Neurology: 11/6  Procedures (surgical and bedside):  10/31 ETT placed 10/31 Chest tube placed discontinued on 03/13/2018 with less than 20 cc drainage over 24 hours. 10/31 L subclavian CVL 11/1 TPA/pulmozyme administration  Significant Diagnostic Tests:  Chest CT 10/31 with effusion and air in pleural space CXR 11/2: near complete resolution of the left pleural effusion. CXR 11/4: persistent consolidation Left lung base MRI 11/6: Multifocal acute ischemia likely from embolic disease. Affected sites are left parietal lobe, right parietal love, and posterior left frontal lobe 11/8 barium swallow: esophageal dysmotility, no leak  Micro Data:  Blood 10/31: NG 4 days Ucx: multiple species present Tracheal aspirate: few multiple organisms present Pleural fluid: E. Coli, enteroccoccus avium, pseudomonas. Sensitive to all but ampicillin MRSA PCR: neg  Antimicrobials:  Flagyl 10/31-10/31 (1 dose) Vancomycin 10/31>>> 11/3 Zosyn 10/31>>> 11/3 Unasyn 11/4>>>  Subjective:  Tolerated chest tube removal without problem  Objective   Blood pressure (!) 112/95, pulse 100, temperature 98.2 F (36.8 C), temperature source Oral, resp. rate 20, height 5\' 2"  (1.575 m), weight 65.1 kg, SpO2 97 %.        Intake/Output Summary (Last 24 hours) at 03/13/2018 1124 Last data filed at  03/13/2018 0955 Gross per 24 hour  Intake 2837 ml  Output 2550 ml  Net 287 ml   Filed Weights   03/09/18 0400 03/10/18 0500 03/10/18 2141  Weight: 62.7 kg 64.4 kg 65.1 kg    Examination:  General: Demented confused female no acute distress HEENT: JVD lymphadenopathy is appreciated Neuro: Moves all extremities follows commands but is obviously confused CV: Distant PULM: even/non-labored, lungs decreased breath sounds in the left left chest tube is been removed noted to be kinked after removal PO:EUMP, non-tender, bsx4 active  Extremities: warm/dry, 1+ edema  Skin: no rashes or lesions     Resolved Hospital Problem list   Septic shock Hypophosphatemia  Assessment & Plan:   Polymicrobial empyema on left: suspect due to aspiration pneumonia > Follow-up chest x-ray status post chest tube removal >  removed 03/13/2018 > Agree with Unasyn  Central line: needs to be D/C'd > She will need a PICC line this was ordered 03/12/2018 once in place she can discontinue left subclavian CVL  Nutrition:  TPN per primary  Esophageal dysmotility: likely cause of aspiration > Consider GI consult        Labs (personally reviewed)  CBC: Recent Labs  Lab 03/07/18 0352 03/08/18 0414 03/09/18 0350 03/10/18 0451 03/11/18 0500 03/12/18 0330 03/13/18 0234  WBC 16.9* 13.1* 12.4* 14.2* 17.4* 18.0* 17.9*  NEUTROABS 14.5* 9.9* 11.3* 11.9*  --   --   --   HGB 8.6* 8.5* 8.2* 8.4* 8.6* 7.7* 9.5*  HCT 24.7* 24.2* 24.6* 26.0* 25.9* 23.6* 29.5*  MCV 88.5 88.3 90.1 90.9 92.8 94.0 92.8  PLT 121* 102* 122* 135* 165 173 536    Basic Metabolic Panel:  Recent Labs  Lab 03/07/18 0352 03/08/18 0414  03/09/18 0350 03/10/18 0451 03/11/18 0500 03/12/18 0330 03/13/18 0234  NA 137 143   < > 143 139 140 138 138  K 3.5 2.6*   < > 3.1* 3.7 3.9 4.0 4.1  CL 117* 117*   < > 115* 108 110 108 111  CO2 17* 21*   < > 24 28 25 26 24   GLUCOSE 180* 149*   < > 123* 125* 80 106* 87  BUN 30* 24*   < > 19  17 14 14 17   CREATININE 1.25* 0.93   < > 0.66 0.42* 0.40* 0.39* 0.48  CALCIUM 8.1* 8.2*   < > 8.1* 7.8* 7.7* 7.6* 7.8*  MG 2.0 1.7  --  2.0  --  1.7 1.7  --   PHOS 2.9 2.7  --  1.7* 2.7 2.9 3.2  --    < > = values in this interval not displayed.   GFR: Estimated Creatinine Clearance: 59.6 mL/min (by C-G formula based on SCr of 0.48 mg/dL). Recent Labs  Lab 03/10/18 0451 03/11/18 0500 03/12/18 0330 03/13/18 0234  WBC 14.2* 17.4* 18.0* 17.9*    Liver Function Tests: Recent Labs  Lab 03/07/18 0352 03/08/18 0414 03/09/18 0350 03/10/18 0451 03/11/18 0500  AST 51* 56* 55* 49* 34  ALT 37 47* 50* 49* 36  ALKPHOS 341* 380* 475* 408* 326*  BILITOT 1.3* 0.8 0.8 0.7 0.5  PROT 4.5* 4.6* 4.4* 4.3* 4.5*  ALBUMIN 1.4* 1.4* 1.3* 1.3* 1.4*   No results for input(s): LIPASE, AMYLASE in the last 168 hours. No results for input(s): AMMONIA in the last 168 hours.  ABG    Component Value Date/Time   PHART 7.334 (L) 03/06/2018 0406   PCO2ART 32.8 03/06/2018 0406   PO2ART 157.0 (H) 03/06/2018 0406   HCO3 17.6 (L) 03/06/2018 0406   TCO2 19 (L) 03/06/2018 0406   ACIDBASEDEF 8.0 (H) 03/06/2018 0406   O2SAT 99.0 03/06/2018 0406     Coagulation Profile: Recent Labs  Lab 03/08/18 0414  INR 1.45    Cardiac Enzymes: No results for input(s): CKTOTAL, CKMB, CKMBINDEX, TROPONINI in the last 168 hours.  HbA1C: Hgb A1c MFr Bld  Date/Time Value Ref Range Status  03/10/2018 04:51 AM 5.1 4.8 - 5.6 % Final    Comment:    (NOTE) Pre diabetes:          5.7%-6.4% Diabetes:              >6.4% Glycemic control for   <7.0% adults with diabetes     CBG: Recent Labs  Lab 03/12/18 0554 03/12/18 0830 03/12/18 1707 03/12/18 2008 03/12/18 2344  GLUCAP 104* 112* 117* 5* 91       Steve Vonya Ohalloran ACNP Maryanna Shape PCCM Pager [phone] till 1 pm If no answer page [phone] 03/13/2018, 11:24 AM

## 2018-03-13 NOTE — Progress Notes (Addendum)
ANTICOAGULATION CONSULT NOTE - Follow Up  Pharmacy Consult:  Heparin Indication: bilateral LE  DVT   Allergies  Allergen Reactions  . Crestor [Rosuvastatin Calcium] Swelling and Other (See Comments)    Swelling of legs and muscle weakness  . Zocor [Simvastatin] Swelling and Other (See Comments)    Swelling of legs and muscle weakness    Patient Measurements: Height: 5\' 2"  (157.5 cm) Weight: (bed scales is no-operational/nurse is aware ) IBW/kg (Calculated) : 50.1 HEPARIN DW (KG): 63.4  Vital Signs: Temp: 98.3 F (36.8 C) (11/09 0341) Temp Source: Oral (11/09 0341) BP: 112/79 (11/09 0341) Pulse Rate: 99 (11/08 2116)  Labs: Recent Labs    03/11/18 0500 03/12/18 0330 03/13/18 0234  HGB 8.6* 7.7* 9.5*  HCT 25.9* 23.6* 29.5*  PLT 165 173 206  HEPARINUNFRC 0.49 0.31 0.28*  CREATININE 0.40* 0.39* 0.48    Estimated Creatinine Clearance: 59.6 mL/min (by C-G formula based on SCr of 0.48 mg/dL).    Assessment: Maria Avila is a 18y old female presented with altered mental status from SNF, found to have empyema. Pharmacy now consulted to dose heparin in setting of bilateral LE DVTs. 11/6 MRI: Multifocal acute ischemia likely from embolic disease. Affected sites are left parietal lobe, right parietal love, and posterior left frontal lobe. Will be conservative in dosing. No bolus.  Heparin level is 0.28 on heparin drip at 750 units/hr.  Hgb 9.5 this morning, hct 29.5 Spoke with nurse, confirmed no bleeding signs or symptoms, no infusion line issues. Will increase cautiously and check heparin level later today.  Goal of Therapy:  Heparin level 0.3-0.5 units/mL Monitor platelets by anticoagulation protocol: Yes   Plan:  Increase heparin gtt to 800 units/hr to reach goal Follow-up 6-hour heparin level at 1300 today Daily heparin level and CBC   Thank you for allowing pharmacy to be a part of this patient's care.  Tamela Gammon, PharmD 03/13/2018 7:15 AM PGY-1 Pharmacy  Resident Direct Phone: [phone] Please check [email or web address] for unit-specific pharmacist phone numbers

## 2018-03-13 NOTE — Progress Notes (Signed)
Received phone call from Maria Avila re PICC order to be cancelled.

## 2018-03-13 NOTE — Progress Notes (Signed)
Chest tube removed by NP. Incision dressed with vaseline gauze. Pt tolerated well. Will continue to monitor o2 sat and respirations. Jerald Kief, RN

## 2018-03-13 NOTE — Progress Notes (Signed)
Spoke with Delsa Sale in pharmacy regarding TPN. She advises to cut TPN rate by 1/2 for to 2 hrs. From 75 ml/hr to 37.5 ml/hr. Then to discontinue.  Jerald Kief, RN

## 2018-03-13 NOTE — Progress Notes (Addendum)
PHARMACY - ADULT TOTAL PARENTERAL NUTRITION CONSULT NOTE   Pharmacy Consult:  TPN Indication:  Possible esophageal perforation  Patient Measurements: Height: _0  (157.5 cm) Weight: (bed scales is no-operational/nurse is aware ) IBW/kg (Calculated) : 50.1 TPN AdjBW (KG): 53.8 Body mass index is 26.25 kg/m.  Assessment:  52 YOF with moderate malnutrition presented on 03/04/18 with AMS. Patient was recently hospitalized for abdominal pain, failure to thrive and inability to tolerate an oral diet. Now concerned with esophageal perforation, so Pharmacy consulted to manage TPN.  Per documentation, patient has been eating minimally for the past several months and weight decreased from 64.9 to 58.2 kg, now up with fluid resuscitation.  11/6 - transferred out of ICU  GI: hx colitis/gastritis/FTT.  Prealbumin remains low at < 5, Albumin low at 1.4. Last BM was 11/8. PPI IV. Does not seem to have perforation. Speech recommended dysphagia diet 1 on 11/8. Currently with poor PO intake.  Endo: DM (no PTA med) - CBGs < 150. No hypoglycemic events. Insulin requirements in the past 24 hours: 0 units SSI Lytes: wnl today. Mg still borderline at 1.7. CoCa 9.9 Renal: SCr stable (BL SCr 0.6), BUN 14 - UOP not charted accurately, NS at 75 ml/hr, net +14.8L Neuro: New acute/subacute stroke 11/5 - started on asa supp, statin when able Pulm: extubated 11/4 > RA Cards: HTN hx - BP low-nl, still tachy, remains off pressors. Permissive HTN s/p stroke 11/5 AC: New bilateral DVT - started on heparin Hepatobil: LFTs normalized, alk phos down to 326, tbili nl, INR down to 1.45, TG WNL ID: Unasyn for Enterococcal and Ecoli/pseudomonas (not treating since clinically better per MD) empyema - afeb, WBC up to 18 TPN Access: CVC triple lumen placed 03/04/18 TPN start date: 03/06/18  Nutritional Goals (*per updated RD rec on 03/09/18*): 1700-1900 kCal, 95-110gm protein, >/=1.7L fluid per day  Current Nutrition:   TPN Dysphagia 1 diet (~20% intake with poor appetite)  Plan:  Continue TPN at 75 ml/hr. TPN will provide 106g AA, 234g CHO, and 52g lipids for a total of 1742 kCal, meeting 100% of patient needs Electrolytes in TPN: Continue increased Mg, Phos and K. Cl:Ac 1:1 Daily multivitamin and trace elements in TPN  Continue sensitive SSI q8hr and monitor CBGs Monitor TPN labs  F/U ability to tolerate diet better and wean TPN  ADDENDUM:  Primary discontinued TPN and added Ensure supplements to see if patient will eat more. Rx will sign off, re-consult if needed  Elenor Quinones, PharmD, BCPS Clinical Pharmacist Phone number [phone] 03/13/2018 7:11 AM

## 2018-03-13 NOTE — Evaluation (Signed)
Speech Language Pathology Evaluation Patient Details Name: Maria Avila MRN: [id number] DOB: 1950/08/12 Today's Date: 03/13/2018 Time: 3354-5625 SLP Time Calculation (min) (ACUTE ONLY): 12 min  Problem List:  Patient Active Problem List   Diagnosis Date Noted  . Chest tube in place   . Esophageal perforation   . Perforation esophagus   . Sepsis with acute renal failure ([place])   . Acute deep vein thrombosis (DVT) of both peroneal veins   . Leukocytosis   . Sepsis due to Pseudomonas species ([place])   . Cerebral embolism with cerebral infarction 03/09/2018  . Malnutrition of moderate degree 03/08/2018  . Empyema ([place]) 03/04/2018  . Pressure injury of skin 03/04/2018  . Acute respiratory failure with hypercapnia ([place]) 03/04/2018  . Diarrhea 02/26/2018  . Pancytopenia ([place]) 02/19/2018  . Hypotension 02/19/2018  . Severe protein-calorie malnutrition ([place]) 02/17/2018  . Colitis 02/16/2018  . Weakness 02/16/2018  . Nausea vomiting and diarrhea 02/16/2018  . Diabetes ([place]) 02/16/2018  . Essential hypertension 02/16/2018  . Anemia 02/16/2018   Past Medical History:  Past Medical History:  Diagnosis Date  . Colitis   . Diabetes mellitus without complication ([place])   . Hypertension   . Malnutrition ([place])    Past Surgical History:  Past Surgical History:  Procedure Laterality Date  . BIOPSY  02/26/2018   Procedure: BIOPSY;  Surgeon: Wilford Corner, MD;  Location: [facility];  Service: Endoscopy;;  . COLONOSCOPY WITH PROPOFOL N/A 02/26/2018   Procedure: COLONOSCOPY WITH PROPOFOL;  Surgeon: Wilford Corner, MD;  Location: [facility];  Service: Endoscopy;  Laterality: N/A;  . ESOPHAGOGASTRODUODENOSCOPY (EGD) WITH PROPOFOL N/A 02/26/2018   Procedure: ESOPHAGOGASTRODUODENOSCOPY (EGD) WITH PROPOFOL;  Surgeon: Wilford Corner, MD;  Location: [facility];  Service: Endoscopy;  Laterality: N/A;  . NO PAST SURGERIES     UNCERTAIN OF NAME OF SURGERY   HPI:  Pt is a 67 year old  admitted with respiratory failure, left lung empyema s/p CT placement 10/31, and septic shock. ETT 10/31-11/4. MRI 11/6 showed multifocal, bilateral acute ischemia with largest infarction in the L parietal lobe. Additional areas include the L frontal lobe, R parietal lobe, and pons. PMH: malnutrition and unexplained FTT (s/p recent GI and psych consults), HTN, DM   Assessment / Plan / Recommendation Clinical Impression  Pt presents with moderate cognitive-linguistic deficits, including reduced recall of information/daily events and decreased orientation (Ox1). Verbal expression is in short utterances and influenced by perseverative errors, both in spontaneous communication and confrontational naming trials. She has difficulty processing information and maintaining topic of conversation, getting information easily interchanged. Evaluation was stopped when NP arrived to remove CT. Pt will benefit from SLP f/u to facilitate communication and cognition, wtih additional tx focusing on differential diagnosis needed.    SLP Assessment  SLP Recommendation/Assessment: Patient needs continued Speech Lanaguage Pathology Services SLP Visit Diagnosis: Cognitive communication deficit (R41.841)    Follow Up Recommendations  Inpatient Rehab    Frequency and Duration min 2x/week  2 weeks      SLP Evaluation Cognition  Overall Cognitive Status: Impaired/Different from baseline Arousal/Alertness: Awake/alert Orientation Level: Oriented to person;Disoriented to place;Disoriented to time;Disoriented to situation Attention: Sustained Sustained Attention: Impaired Sustained Attention Impairment: Verbal basic Memory: Impaired Memory Impairment: Storage deficit;Retrieval deficit;Decreased recall of new information Awareness: Impaired Awareness Impairment: Intellectual impairment;Emergent impairment Problem Solving: Impaired Problem Solving Impairment: Verbal basic Safety/Judgment: Impaired        Comprehension  Auditory Comprehension Overall Auditory Comprehension: Impaired Commands: Impaired Conversation: Simple    Expression Expression Primary  Mode of Expression: Verbal Verbal Expression Overall Verbal Expression: Impaired Initiation: No impairment Automatic Speech: Name;Social Response Level of Generative/Spontaneous Verbalization: Sentence Naming: Impairment Confrontation: Impaired Verbal Errors: Perseveration Non-Verbal Means of Communication: Not applicable   Oral / Motor  Motor Speech Overall Motor Speech: Appears within functional limits for tasks assessed   GO                    Germain Osgood 03/13/2018, 11:40 AM  Germain Osgood, M.A. [place] Acute Environmental education officer [phone] Office [phone]

## 2018-03-14 ENCOUNTER — Inpatient Hospital Stay (HOSPITAL_COMMUNITY): Payer: Medicare Other

## 2018-03-14 LAB — CBC
HEMATOCRIT: 27.7 % — AB (ref 36.0–46.0)
HEMOGLOBIN: 9 g/dL — AB (ref 12.0–15.0)
MCH: 30.5 pg (ref 26.0–34.0)
MCHC: 32.5 g/dL (ref 30.0–36.0)
MCV: 93.9 fL (ref 80.0–100.0)
NRBC: 0 % (ref 0.0–0.2)
Platelets: 243 10*3/uL (ref 150–400)
RBC: 2.95 MIL/uL — AB (ref 3.87–5.11)
RDW: 16.8 % — ABNORMAL HIGH (ref 11.5–15.5)
WBC: 16.3 10*3/uL — ABNORMAL HIGH (ref 4.0–10.5)

## 2018-03-14 LAB — BASIC METABOLIC PANEL
ANION GAP: 4 — AB (ref 5–15)
BUN: 16 mg/dL (ref 8–23)
CHLORIDE: 111 mmol/L (ref 98–111)
CO2: 22 mmol/L (ref 22–32)
Calcium: 7.8 mg/dL — ABNORMAL LOW (ref 8.9–10.3)
Creatinine, Ser: 0.58 mg/dL (ref 0.44–1.00)
GFR calc Af Amer: >60 mL/min (ref >60)
GFR calc non Af Amer: >60 mL/min (ref >60)
GLUCOSE: 91 mg/dL (ref 70–99)
POTASSIUM: 4.2 mmol/L (ref 3.5–5.1)
Sodium: 137 mmol/L (ref 135–145)

## 2018-03-14 LAB — GLUCOSE, CAPILLARY
GLUCOSE-CAPILLARY: 84 mg/dL (ref 70–99)
GLUCOSE-CAPILLARY: 87 mg/dL (ref 70–99)
Glucose-Capillary: 78 mg/dL (ref 70–99)
Glucose-Capillary: 99 mg/dL (ref 70–99)

## 2018-03-14 MED ORDER — GERHARDT'S BUTT CREAM
TOPICAL_CREAM | Freq: Every day | CUTANEOUS | Status: DC
  Administered 2018-03-14 – 2018-03-17 (×4): via TOPICAL
  Filled 2018-03-14: qty 1

## 2018-03-14 MED ORDER — INSULIN ASPART 100 UNIT/ML ~~LOC~~ SOLN: 0.0000 [IU] | Freq: Three times a day (TID) | SUBCUTANEOUS | Status: DC

## 2018-03-14 NOTE — Progress Notes (Signed)
Pt's 2340 CBG 76, after 2 orange juice, Recheck 99. Pt ao x4. No s/s of distress. Will continue to monitor.

## 2018-03-14 NOTE — Progress Notes (Signed)
PROGRESS NOTE                                                                                                                                                                                                             Patient Demographics:    Maria Avila, is a 67 y.o. female, DOB - 1950/12/18, GBT:[id number]  Admit date - 03/04/2018   Admitting Physician [name], MD  Outpatient Primary MD for the patient is [name], MD  LOS - 10   Chief Complaint  Patient presents with  . Altered Mental Status       Brief Narrative    67 y.o. female with history of malnutrition, hypertension, diabetes and colitis , was admitted to ICU 03/04/2018 due to septic shock from empyema, patient with recent hospitalization letter to that significant for endoscopy with no acute findings, she remained in SNF for 4 days, then she presents with septic shock, work-up significant for empyema, requiring pressors, intubation, became off pressors, extubated, then she was noted to have moving her right side, and had left gaze preference, work-up significant for acute CVA, embolic, as well she had acute DVT, she was transferred to [facility] care 03/11/2018.  10/31 ETT placed 10/31 Chest tube placed 11/1 TPA/pulmozyme administration  11/4 extubated   Subjective:    [name] today denies any complaints, flex seal was inserted yesterday, appears to be empty,   Assessment  & Plan :    Active Problems:   Severe protein-calorie malnutrition (HCC)   Empyema (HCC)   Pressure injury of skin   Acute respiratory failure with hypercapnia (HCC)   Malnutrition of moderate degree   Cerebral embolism with cerebral infarction   Chest tube in place   Esophageal perforation   Perforation esophagus   Sepsis with acute renal failure (HCC)   Acute deep vein thrombosis (DVT) of both peroneal veins   Leukocytosis   Sepsis due to Pseudomonas species ([place])  Septic shock  due to empyema -Present on admission, requiring pressure support, CT chest showing free air and large pleural effusion, status post chest tube 07/03/2017,. - intubated on admission 03/04/2018, extubated 03/09/2018 -Edema culture showing Pseudomonas, E. coli and enterococcus and pleural fluid, initially treated with Unasyn, given the fact she is been having leukocytosis, and pleural culture growing Pseudomonas, she was transitioned to [facility] 03/12/2018.  I will discuss with ID appropriate length of treatment -Chest tube management per PCCM, tube discontinued 03/13/2018 by pulmonary.  As there is a small residual effusion with no clear evidence of loculation, chest tube was discontinued as it is not draining, as well as nonfunctional due to being kinked. -This is most likely in the setting of aspiration pneumonia, no evidence of esophageal perforation on esophgiogram, she was started on a diet after her barium swallow evaluation. -PN stopped 03/13/2018  Acute CVA - Left parietal small infarcts, left MCA/ACA, right parietal and pontine punctate infarcts, -Most likely embolic pattern, cardioembolic versus DVT in the setting of PFO, versus endocarditis, versus hypercoagulable state due to severe sepsis. -on heparin for her lower extremity DVT, TCD bubble study with no PFO at rest, patient not cooperative on Valsalva test , now there is no further procedures anticipated, now on Eliquis  Acute respiratory failure -Due to empyema and septic shock on admission, requiring intubation, currently extubated, back to room air  Bilateral lower extremity DVT -Lower extremity venous Doppler showing bilateral lower extremity DVT with right posterior tibial veins and left peroneal veins distally -TCD bubble study no PFO at rest, not cooperative on valsalva test -Now she can take oral, no further procedures are anticipated, she was switched from heparin gtt. to Eliquis  Diabetes -Sliding scale, hemoglobin A1c is 5.1,  change sliding scale to before meals  Protein calorie malnutrition -Was on TPN, started on diet yesterday, because of length with her today, encouraged to drink Ensure  Anemia -She had 1 unit PRBC 03/12/2018 for hemoglobin of 7.7, myoglobin stable since transfusion, hemoglobin is 9 today  Code Status : partial  Family Communication  : none at bedside  Disposition Plan  : cir CONSULTED  Consults  : PCCM, CT surgery, neuurology  Procedures  :  10/31 ETT placed 10/31 Chest tube placed, removed 03/13/2018 11/1 TPA/pulmozyme administration  11/4 extubated  Left subclavian central line will be discontinued 01/2018  DVT Prophylaxis  :  eliquis  Lab Results  Component Value Date   PLT 243 03/14/2018    Antibiotics  :    Anti-infectives (From admission, onward)   Start     Dose/Rate Route Frequency Ordered Stop   03/12/18 1400  piperacillin-tazobactam (ZOSYN) IVPB 3.375 g     3.375 g 12.5 mL/hr over 240 Minutes Intravenous Every 8 hours 03/12/18 1332     03/07/18 1000  Ampicillin-Sulbactam (UNASYN) 3 g in sodium chloride 0.9 % 100 mL IVPB  Status:  Discontinued     3 g 200 mL/hr over 30 Minutes Intravenous Every 8 hours 03/07/18 0948 03/12/18 1332   03/05/18 1200  vancomycin (VANCOCIN) 500 mg in sodium chloride 0.9 % 100 mL IVPB  Status:  Discontinued     500 mg 100 mL/hr over 60 Minutes Intravenous Every 24 hours 03/04/18 1801 03/07/18 0948   03/04/18 2200  piperacillin-tazobactam (ZOSYN) IVPB 3.375 g  Status:  Discontinued     3.375 g 12.5 mL/hr over 240 Minutes Intravenous Every 8 hours 03/04/18 1801 03/07/18 0948   03/04/18 0915  ceFEPIme (MAXIPIME) 2 g in sodium chloride 0.9 % 100 mL IVPB     2 g 200 mL/hr over 30 Minutes Intravenous  Once 03/04/18 0910 03/04/18 1000   03/04/18 0915  metroNIDAZOLE (FLAGYL) IVPB 500 mg  Status:  Discontinued     500 mg 100 mL/hr over 60 Minutes Intravenous Every 8 hours 03/04/18 0910 03/04/18 1801   03/04/18 0915  vancomycin (VANCOCIN)  IVPB  1000 mg/200 mL premix     1,000 mg 200 mL/hr over 60 Minutes Intravenous  Once 03/04/18 0910 03/04/18 1338        Objective:   Vitals:   03/13/18 1658 03/13/18 2001 03/13/18 2343 03/14/18 0330  BP: (!) 122/98 120/79 112/85 117/86  Pulse: (!) 102 92 93 (!) 103  Resp: (!) 21 13 20    Temp: 98.3 F (36.8 C) 98.6 F (37 C) 98.2 F (36.8 C) 98.2 F (36.8 C)  TempSrc: Oral Oral Oral Oral  SpO2: 100% 100% 100% 100%  Weight:      Height:        Wt Readings from Last 3 Encounters:  03/10/18 65.1 kg  02/16/18 48.7 kg  02/05/18 48.1 kg     Intake/Output Summary (Last 24 hours) at 03/14/2018 1139 Last data filed at 03/14/2018 0300 Gross per 24 hour  Intake 2285.19 ml  Output 1600 ml  Net 685.19 ml     Physical Exam  Awake Alert, pleasant, communicative, coherent Symmetrical Chest wall movement, Good air movement bilaterally, CTAB RRR,No Gallops,Rubs or new Murmurs, No Parasternal Heave +ve B.Sounds, Abd Soft, No tenderness, No rebound - guarding or rigidity. No Cyanosis, Clubbing or edema, No new Rash or bruise    Left subclavian discontinued 03/13/2018, chest tube discontinued 03/13/2018    Data Review:    CBC Recent Labs  Lab 03/08/18 0414 03/09/18 0350 03/10/18 0451 03/11/18 0500 03/12/18 0330 03/13/18 0234 03/14/18 0351  WBC 13.1* 12.4* 14.2* 17.4* 18.0* 17.9* 16.3*  HGB 8.5* 8.2* 8.4* 8.6* 7.7* 9.5* 9.0*  HCT 24.2* 24.6* 26.0* 25.9* 23.6* 29.5* 27.7*  PLT 102* 122* 135* 165 173 206 243  MCV 88.3 90.1 90.9 92.8 94.0 92.8 93.9  MCH 31.0 30.0 29.4 30.8 30.7 29.9 30.5  MCHC 35.1 33.3 32.3 33.2 32.6 32.2 32.5  RDW 15.6* 15.6* 15.6* 16.1* 16.3* 16.1* 16.8*  LYMPHSABS 1.9 0.9 1.7  --   --   --   --   MONOABS 0.7 0.1 0.6  --   --   --   --   EOSABS 0.0 0.1 0.0  --   --   --   --   BASOSABS 0.0 0.0 0.0  --   --   --   --     Chemistries  Recent Labs  Lab 03/08/18 0414  03/09/18 0350 03/10/18 0451 03/11/18 0500 03/12/18 0330 03/13/18 0234  03/14/18 0351  NA 143   < > 143 139 140 138 138 137  K 2.6*   < > 3.1* 3.7 3.9 4.0 4.1 4.2  CL 117*   < > 115* 108 110 108 111 111  CO2 21*   < > 24 28 25 26 24 22   GLUCOSE 149*   < > 123* 125* 80 106* 87 91  BUN 24*   < > 19 17 14 14 17 16   CREATININE 0.93   < > 0.66 0.42* 0.40* 0.39* 0.48 0.58  CALCIUM 8.2*   < > 8.1* 7.8* 7.7* 7.6* 7.8* 7.8*  MG 1.7  --  2.0  --  1.7 1.7  --   --   AST 56*  --  55* 49* 34  --   --   --   ALT 47*  --  50* 49* 36  --   --   --   ALKPHOS 380*  --  475* 408* 326*  --   --   --   BILITOT 0.8  --  0.8 0.7  0.5  --   --   --    < > = values in this interval not displayed.   ------------------------------------------------------------------------------------------------------------------ No results for input(s): CHOL, HDL, LDLCALC, TRIG, CHOLHDL, LDLDIRECT in the last 72 hours.  Lab Results  Component Value Date   HGBA1C 5.1 03/10/2018   ------------------------------------------------------------------------------------------------------------------ No results for input(s): TSH, T4TOTAL, T3FREE, THYROIDAB in the last 72 hours.  Invalid input(s): FREET3 ------------------------------------------------------------------------------------------------------------------ No results for input(s): VITAMINB12, FOLATE, FERRITIN, TIBC, IRON, RETICCTPCT in the last 72 hours.  Coagulation profile Recent Labs  Lab 03/08/18 0414  INR 1.45    No results for input(s): DDIMER in the last 72 hours.  Cardiac Enzymes No results for input(s): CKMB, TROPONINI, MYOGLOBIN in the last 168 hours.  Invalid input(s): CK ------------------------------------------------------------------------------------------------------------------ No results found for: BNP  Inpatient Medications  Scheduled Meds: . apixaban  10 mg Oral BID   Followed by  . [START ON 03/20/2018] apixaban  5 mg Oral BID  . chlorhexidine  15 mL Mouth Rinse BID  . Chlorhexidine Gluconate Cloth  6  each Topical Daily  . collagenase   Topical Daily  . feeding supplement (ENSURE ENLIVE)  237 mL Oral TID BM  . Gerhardt's butt cream   Topical Daily  . insulin aspart  0-9 Units Subcutaneous Q8H  . mouth rinse  15 mL Mouth Rinse q12n4p  . pantoprazole (PROTONIX) IV  40 mg Intravenous Q24H  . sodium chloride flush  10-40 mL Intracatheter Q12H   Continuous Infusions: . sodium chloride Stopped (03/04/18 2139)  . sodium chloride 75 mL/hr at 03/14/18 0510  . piperacillin-tazobactam (ZOSYN)  IV 3.375 g (03/14/18 0606)   PRN Meds:.bisacodyl, sodium chloride flush  Micro Results Recent Results (from the past 240 hour(s))  MRSA PCR Screening     Status: None   Collection Time: 03/04/18  2:50 PM  Result Value Ref Range Status   MRSA by PCR NEGATIVE NEGATIVE Final    Comment:        The GeneXpert MRSA Assay (FDA approved for NASAL specimens only), is one component of a comprehensive MRSA colonization surveillance program. It is not intended to diagnose MRSA infection nor to guide or monitor treatment for MRSA infections. Performed at [facility], [place] [address], [place], [place] [address]   Culture, respiratory (tracheal aspirate)     Status: None   Collection Time: 03/04/18  3:50 PM  Result Value Ref Range Status   Specimen Description TRACHEAL ASPIRATE  Final   Special Requests Normal  Final   Gram Stain   Final    ABUNDANT WBC PRESENT, PREDOMINANTLY PMN ABUNDANT GRAM POSITIVE COCCI FEW GRAM POSITIVE RODS FEW GRAM NEGATIVE RODS Performed at [facility], [place] [address] [place], [place] [address]    Culture FEW MULTIPLE ORGANISMS PRESENT, NONE PREDOMINANT  Final   Report Status 03/07/2018 FINAL  Final  Body fluid culture     Status: None   Collection Time: 03/04/18  4:32 PM  Result Value Ref Range Status   Specimen Description FLUID PLEURAL  Final   Special Requests NONE  Final   Gram Stain   Final    ABUNDANT WBC PRESENT, PREDOMINANTLY PMN MODERATE GRAM  POSITIVE COCCI IN PAIRS IN CLUSTERS FEW GRAM POSITIVE RODS FEW GRAM NEGATIVE RODS Performed at [facility], [place] [address], [place], [place] [address]    Culture   Final    RARE ESCHERICHIA COLI FEW ENTEROCOCCUS AVIUM RARE PSEUDOMONAS AERUGINOSA    Report Status  03/09/2018 FINAL  Final   Organism ID, Bacteria ESCHERICHIA COLI  Final   Organism ID, Bacteria ENTEROCOCCUS AVIUM  Final   Organism ID, Bacteria PSEUDOMONAS AERUGINOSA  Final      Susceptibility   Enterococcus avium - MIC*    AMPICILLIN <=2 SENSITIVE Sensitive     VANCOMYCIN <=0.5 SENSITIVE Sensitive     GENTAMICIN SYNERGY SENSITIVE Sensitive     * FEW ENTEROCOCCUS AVIUM   Escherichia coli - MIC*    AMPICILLIN >=32 RESISTANT Resistant     CEFAZOLIN <=4 SENSITIVE Sensitive     CEFEPIME <=1 SENSITIVE Sensitive     CEFTAZIDIME <=1 SENSITIVE Sensitive     CEFTRIAXONE <=1 SENSITIVE Sensitive     CIPROFLOXACIN <=0.25 SENSITIVE Sensitive     GENTAMICIN <=1 SENSITIVE Sensitive     IMIPENEM <=0.25 SENSITIVE Sensitive     TRIMETH/SULFA <=20 SENSITIVE Sensitive     AMPICILLIN/SULBACTAM 8 SENSITIVE Sensitive     PIP/TAZO <=4 SENSITIVE Sensitive     Extended ESBL NEGATIVE Sensitive     * RARE ESCHERICHIA COLI   Pseudomonas aeruginosa - MIC*    CEFTAZIDIME 4 SENSITIVE Sensitive     CIPROFLOXACIN <=0.25 SENSITIVE Sensitive     GENTAMICIN <=1 SENSITIVE Sensitive     IMIPENEM <=0.25 SENSITIVE Sensitive     PIP/TAZO 8 SENSITIVE Sensitive     CEFEPIME 2 SENSITIVE Sensitive     * RARE PSEUDOMONAS AERUGINOSA  Culture, blood (routine x 2)     Status: None   Collection Time: 03/04/18  5:42 PM  Result Value Ref Range Status   Specimen Description BLOOD SITE NOT SPECIFIED  Final   Special Requests   Final    BOTTLES DRAWN AEROBIC AND ANAEROBIC Blood Culture adequate volume   Culture   Final    NO GROWTH 5 DAYS Performed at [facility], [address], [place], [place] [address]    Report Status 03/09/2018 FINAL   Final  Culture, blood (routine x 2)     Status: None   Collection Time: 03/04/18  5:42 PM  Result Value Ref Range Status   Specimen Description BLOOD SITE NOT SPECIFIED  Final   Special Requests   Final    BOTTLES DRAWN AEROBIC AND ANAEROBIC Blood Culture adequate volume   Culture   Final    NO GROWTH 5 DAYS Performed at [facility], [address], [place], [place] [address]    Report Status 03/09/2018 FINAL  Final  Urine culture     Status: Abnormal   Collection Time: 03/04/18  6:47 PM  Result Value Ref Range Status   Specimen Description URINE, CATHETERIZED  Final   Special Requests   Final    Normal Performed at [facility], [place] [address], [place], [place] [address]    Culture MULTIPLE SPECIES PRESENT, SUGGEST RECOLLECTION (A)  Final   Report Status 03/05/2018 FINAL  Final    Radiology Reports Ct Angio Head W Or Wo Contrast  Result Date: 03/09/2018 CLINICAL DATA:  67 y/o F; patient extubated yesterday. Right-sided weakness and slow speech. Increased lethargy. EXAM: CT ANGIOGRAPHY HEAD AND NECK CT PERFUSION BRAIN TECHNIQUE: Multidetector CT imaging of the head and neck was performed using the standard protocol during bolus administration of intravenous contrast. Multiplanar CT image reconstructions and MIPs were obtained to evaluate the vascular anatomy. Carotid stenosis measurements (when applicable) are obtained utilizing NASCET criteria, using the distal internal carotid diameter as the denominator. Multiphase CT imaging of the brain  was performed following IV bolus contrast injection. Subsequent parametric perfusion maps were calculated using RAPID software. CONTRAST:  90 cc Isovue 370 COMPARISON:  03/09/2018, 03/04/2018, 02/16/2018 CT head. 02/16/2018 MRI head. FINDINGS: CTA NECK FINDINGS Aortic arch: Standard branching. Imaged portion shows no evidence of aneurysm or dissection. No significant stenosis of the major arch vessel origins. Mild calcific aortic  atherosclerosis. Right carotid system: No evidence of dissection, stenosis (50% or greater) or occlusion. Mild non stenotic calcific atherosclerosis of the carotid bifurcation. Left carotid system: No evidence of dissection, stenosis (50% or greater) or occlusion. Minimal non stenotic calcific atherosclerosis of the carotid bifurcation. Vertebral arteries: Left dominant, diminutive right vertebral artery. No evidence of dissection, stenosis (50% or greater) or occlusion. Skeleton: Moderate spondylosis of the cervical spine with multilevel disc and facet degenerative changes. No high-grade bony canal stenosis. Other neck: Negative. Upper chest: Negative. Review of the MIP images confirms the above findings CTA HEAD FINDINGS Anterior circulation: No significant stenosis, proximal occlusion, aneurysm, or vascular malformation. Mild non stenotic calcific atherosclerosis of the carotid siphons. Posterior circulation: No significant stenosis, proximal occlusion, aneurysm, or vascular malformation. Venous sinuses: As permitted by contrast timing, patent. Anatomic variants: Right vertebral artery largely terminates in the right PICA with diminutive contribution to the basilar. Complete circle-of-Willis. Large left A1, large anterior communicating artery, diminutive right A1, normal variant Review of the MIP images confirms the above findings CT Brain Perfusion Findings: CBF (<30%) Volume: 33mL Perfusion (Tmax>6.0s) volume: 86mL Mismatch Volume: 43mL Infarction Location:Left parietal lobe subacute infarction visible on noncontrast CT, pseudonormalized perfusion. IMPRESSION: 1. Patent carotid and vertebral arteries. No dissection, aneurysm, or hemodynamically significant stenosis utilizing NASCET criteria. 2. Patent anterior and posterior intracranial circulation. No large vessel occlusion, aneurysm, or significant stenosis. 3. Left parietal lobe subacute infarction visible on noncontrast CT with pseudonormalized perfusion. 4.  No perfusion anomaly to suggest interval acute infarct. Electronically Signed   By: Kristine Garbe M.D.   On: 03/09/2018 18:44   Ct Head Wo Contrast  Result Date: 03/09/2018 CLINICAL DATA:  Residual right-sided weakness. EXAM: CT HEAD WITHOUT CONTRAST TECHNIQUE: Contiguous axial images were obtained from the base of the skull through the vertex without intravenous contrast. COMPARISON:  03/04/2018 CT.  02/16/2018 MRI. FINDINGS: Brain: There is acute to subacute infarction in the left parietal lobe consistent with left MCA branch vessel infarction. The brain shows low-density with mild swelling. No hemorrhage. No other abnormal brain finding. No mass, hydrocephalus or extra-axial collection. Vascular: There is atherosclerotic calcification of the major vessels at the base of the brain. Skull: Negative Sinuses/Orbits: Clear/normal Other: None IMPRESSION: Acute/subacute infarction in the left parietal lobe consistent with MCA branch vessel infarction. No evidence of hemorrhage or mass effect. Electronically Signed   By: Nelson Chimes M.D.   On: 03/09/2018 13:31   Ct Head Wo Contrast  Result Date: 03/04/2018 CLINICAL DATA:  Altered level of consciousness EXAM: CT HEAD WITHOUT CONTRAST TECHNIQUE: Contiguous axial images were obtained from the base of the skull through the vertex without intravenous contrast. COMPARISON:  02/16/2018 FINDINGS: Brain: No evidence of acute infarction, hemorrhage, hydrocephalus, extra-axial collection or mass lesion/mass effect. Vascular: No hyperdense vessel or unexpected calcification. Skull: Normal. Negative for fracture or focal lesion. Sinuses/Orbits: No acute finding. Other: None. IMPRESSION: No acute abnormality noted.  No change from the prior exam. Electronically Signed   By: Inez Catalina M.D.   On: 03/04/2018 09:33   Ct Head Wo Contrast  Result Date: 02/16/2018 CLINICAL DATA:  Generalize weakness and  slurred speech for 2 weeks. EXAM: CT HEAD WITHOUT CONTRAST  TECHNIQUE: Contiguous axial images were obtained from the base of the skull through the vertex without intravenous contrast. COMPARISON:  04/01/2005 FINDINGS: Brain: No evidence of acute infarction, hemorrhage, hydrocephalus, extra-axial collection or mass lesion/mass effect. Vascular: Mild intracranial arterial vascular calcifications. Skull: Calvarium appears intact. Sinuses/Orbits: Paranasal sinuses and mastoid air cells are clear. Other: None. IMPRESSION: No acute intracranial abnormality. Electronically Signed   By: Lucienne Capers M.D.   On: 02/16/2018 06:24   Ct Angio Neck W Or Wo Contrast  Result Date: 03/09/2018 CLINICAL DATA:  67 y/o F; patient extubated yesterday. Right-sided weakness and slow speech. Increased lethargy. EXAM: CT ANGIOGRAPHY HEAD AND NECK CT PERFUSION BRAIN TECHNIQUE: Multidetector CT imaging of the head and neck was performed using the standard protocol during bolus administration of intravenous contrast. Multiplanar CT image reconstructions and MIPs were obtained to evaluate the vascular anatomy. Carotid stenosis measurements (when applicable) are obtained utilizing NASCET criteria, using the distal internal carotid diameter as the denominator. Multiphase CT imaging of the brain was performed following IV bolus contrast injection. Subsequent parametric perfusion maps were calculated using RAPID software. CONTRAST:  90 cc Isovue 370 COMPARISON:  03/09/2018, 03/04/2018, 02/16/2018 CT head. 02/16/2018 MRI head. FINDINGS: CTA NECK FINDINGS Aortic arch: Standard branching. Imaged portion shows no evidence of aneurysm or dissection. No significant stenosis of the major arch vessel origins. Mild calcific aortic atherosclerosis. Right carotid system: No evidence of dissection, stenosis (50% or greater) or occlusion. Mild non stenotic calcific atherosclerosis of the carotid bifurcation. Left carotid system: No evidence of dissection, stenosis (50% or greater) or occlusion. Minimal non  stenotic calcific atherosclerosis of the carotid bifurcation. Vertebral arteries: Left dominant, diminutive right vertebral artery. No evidence of dissection, stenosis (50% or greater) or occlusion. Skeleton: Moderate spondylosis of the cervical spine with multilevel disc and facet degenerative changes. No high-grade bony canal stenosis. Other neck: Negative. Upper chest: Negative. Review of the MIP images confirms the above findings CTA HEAD FINDINGS Anterior circulation: No significant stenosis, proximal occlusion, aneurysm, or vascular malformation. Mild non stenotic calcific atherosclerosis of the carotid siphons. Posterior circulation: No significant stenosis, proximal occlusion, aneurysm, or vascular malformation. Venous sinuses: As permitted by contrast timing, patent. Anatomic variants: Right vertebral artery largely terminates in the right PICA with diminutive contribution to the basilar. Complete circle-of-Willis. Large left A1, large anterior communicating artery, diminutive right A1, normal variant Review of the MIP images confirms the above findings CT Brain Perfusion Findings: CBF (<30%) Volume: 70mL Perfusion (Tmax>6.0s) volume: 22mL Mismatch Volume: 65mL Infarction Location:Left parietal lobe subacute infarction visible on noncontrast CT, pseudonormalized perfusion. IMPRESSION: 1. Patent carotid and vertebral arteries. No dissection, aneurysm, or hemodynamically significant stenosis utilizing NASCET criteria. 2. Patent anterior and posterior intracranial circulation. No large vessel occlusion, aneurysm, or significant stenosis. 3. Left parietal lobe subacute infarction visible on noncontrast CT with pseudonormalized perfusion. 4. No perfusion anomaly to suggest interval acute infarct. Electronically Signed   By: Kristine Garbe M.D.   On: 03/09/2018 18:44   Ct Chest Wo Contrast  Result Date: 03/12/2018 CLINICAL DATA:  Assess empyema. EXAM: CT CHEST WITHOUT CONTRAST TECHNIQUE: Multidetector  CT imaging of the chest was performed following the standard protocol without IV contrast. COMPARISON:  Chest radiograph performed 03/08/2018, and CT of the chest performed 03/04/2018 FINDINGS: Cardiovascular: Scattered coronary artery calcifications are seen. The heart is normal in size. The patient's left subclavian line is noted at the distal SVC. Mediastinum/Nodes: No pericardial effusion is  identified. The thyroid gland is unremarkable in appearance. No mediastinal lymphadenopathy is seen. No axillary lymphadenopathy is appreciated. Lungs/Pleura: There has been placement of a left apical chest tube, with decrease in the size of the left-sided pleural effusion. Residual small bilateral pleural effusions are noted, somewhat loculated on the left. A new focus of increased attenuation is noted within the left-sided pleural effusion, which may reflect trace hemorrhage. No definite parenchymal necrosis is seen. There is collapse of much of the left lower lung lobe. Mild right-sided atelectasis is noted. The expanded portions of both lungs are grossly clear. No definite pneumothorax is seen. Slight irregularity in the contour of the proximal trachea may reflect sequelae of prior instrumentation. Upper Abdomen: A small amount of ascites is noted tracking about the liver. The visualized portions of the liver and spleen are otherwise unremarkable. Musculoskeletal: No acute osseous abnormalities are identified. The visualized musculature is unremarkable in appearance. IMPRESSION: 1. Placement of left apical chest tube, with decrease in the size of the left-sided pleural effusion. Residual small bilateral pleural effusions, somewhat loculated on the left. Empyema cannot be excluded, given clinical concern. 2. New focus of increased attenuation within the left-sided pleural effusion may reflect trace hemorrhage. No definite evidence of parenchymal necrosis. 3. Collapse of much of the left lower lung lobe. Mild right-sided  atelectasis. 4. Scattered coronary artery calcifications seen. 5. Small amount of ascites noted tracking about the liver. Electronically Signed   By: Garald Balding M.D.   On: 03/12/2018 23:00   Ct Chest Wo Contrast  Result Date: 03/04/2018 CLINICAL DATA:  Shortness of breath, pneumonia EXAM: CT CHEST WITHOUT CONTRAST TECHNIQUE: Multidetector CT imaging of the chest was performed following the standard protocol without IV contrast. COMPARISON:  Chest x-ray 03/04/2018 FINDINGS: Cardiovascular: Heart is normal size. Aorta is normal caliber with scattered aortic calcifications. Moderate coronary artery calcifications in the left anterior descending coronary artery. Mediastinum/Nodes: Reactive borderline sized mediastinal lymph nodes. Lungs/Pleura: Complex large left pleural effusions with loculation and pleural gas. Cannot exclude bronchopleural fistula or empyema. Collapse of much of the left lung with only a small amount of aerated left upper lobe. Nodular airspace opacities in the right upper lobe and superior segment of the right lower lobe with trace right pleural effusion. Upper Abdomen: Imaging into the upper abdomen shows no acute findings. Musculoskeletal: Chest wall soft tissues are unremarkable. Degenerative changes in the thoracic spine. No acute bony abnormality. IMPRESSION: Large complex left pleural effusion with pleural gas and extensive loculations. This could reflect bronchopleural fistula or empyema. Collapse of much of the left lung with only a small amount of aerated left upper lobe. Nodular ground-glass airspace opacities centrally in the right upper lobe and in the superior segment of the right lower lobe, likely infectious/inflammatory. Coronary artery disease, aortic atherosclerosis. Electronically Signed   By: Rolm Baptise M.D.   On: 03/04/2018 11:39   Mr Brain Wo Contrast  Result Date: 03/10/2018 CLINICAL DATA:  Stroke follow-up EXAM: MRI HEAD WITHOUT CONTRAST TECHNIQUE: Multiplanar,  multiecho pulse sequences of the brain and surrounding structures were obtained without intravenous contrast. COMPARISON:  Brain MRI 02/16/2018 FINDINGS: BRAIN: There are multiple foci of abnormal diffusion restriction, the largest of which is located in the left parietal lobe. Other foci are located in the posterior left frontal lobe, right parietal lobe and the pons. There is no midline shift or mass effect. Mild cytotoxic edema of the left parietal lobe. Minimal white matter hyperintensity for age. Brain parenchyma is otherwise normal. The  cerebral and cerebellar volume are age-appropriate. Susceptibility-sensitive sequences show no chronic microhemorrhage or superficial siderosis. VASCULAR: Major intracranial arterial and venous sinus flow voids are normal. SKULL AND UPPER CERVICAL SPINE: Calvarial bone marrow signal is normal. There is no skull base mass. Visualized upper cervical spine and soft tissues are normal. SINUSES/ORBITS: No fluid levels or advanced mucosal thickening. No mastoid or middle ear effusion. The orbits are normal. IMPRESSION: 1. Multifocal acute ischemia, spread throughout multiple bilateral vascular territories. 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 2. The largest infarction is in the left parietal lobe. Other sites include the posterior left frontal lobe, right parietal lobe and pons. 3. No hemorrhage or mass effect. Electronically Signed   By: Ulyses Jarred M.D.   On: 03/10/2018 06:17   Mr Brain Wo Contrast  Result Date: 02/16/2018 CLINICAL DATA:  Weight loss and weakness. EXAM: MRI HEAD WITHOUT CONTRAST TECHNIQUE: Multiplanar, multiecho pulse sequences of the brain and surrounding structures were obtained without intravenous contrast. COMPARISON:  CT same day FINDINGS: Brain: Generalized atrophy. Diffusion imaging does not show any acute or subacute infarction. The brainstem and cerebellum are normal. Mild chronic small-vessel ischemic change of the cerebral  hemispheric deep white matter. No cortical or large vessel territory infarction. No mass lesion, hemorrhage, hydrocephalus or extra-axial collection. Vascular: Major vessels at the base of the brain show flow. Skull and upper cervical spine: Negative Sinuses/Orbits: Clear/normal Other: None IMPRESSION: No acute or reversible finding. Generalized atrophy. Mild chronic small-vessel ischemic change of the cerebral hemispheric white matter. Electronically Signed   By: Nelson Chimes M.D.   On: 02/16/2018 21:52   Nm Hepato W/eject Fract  Result Date: 02/18/2018 CLINICAL DATA:  Weight loss. EXAM: NUCLEAR MEDICINE HEPATOBILIARY IMAGING WITH GALLBLADDER EF TECHNIQUE: Sequential images of the abdomen were obtained out to 60 minutes following intravenous administration of radiopharmaceutical. After oral ingestion of Ensure, gallbladder ejection fraction was determined. At 60 min, normal ejection fraction is greater than 33%. RADIOPHARMACEUTICALS:  5.3 mCi Tc-29m  Choletec IV COMPARISON:  None. FINDINGS: Prompt uptake and biliary excretion of activity by the liver is seen. Gallbladder activity is visualized, consistent with patency of cystic duct. Biliary activity passes into small bowel, consistent with patent common bile duct. The gallbladder emptied spontaneously before the Ensure. As a result, an ejection fraction cannot be accurately calculated. That being said, the gallbladder is not visualized after ensure and gallbladder emptying is near-complete. IMPRESSION: The gallbladder emptied spontaneously before consumption of Ensure. While an ejection fraction cannot be calculated, gallbladder emptying is near complete. Electronically Signed   By: Dorise Bullion III M.D   On: 02/18/2018 13:40   Ct Cerebral Perfusion W Contrast  Result Date: 03/09/2018 CLINICAL DATA:  67 y/o F; patient extubated yesterday. Right-sided weakness and slow speech. Increased lethargy. EXAM: CT ANGIOGRAPHY HEAD AND NECK CT PERFUSION BRAIN  TECHNIQUE: Multidetector CT imaging of the head and neck was performed using the standard protocol during bolus administration of intravenous contrast. Multiplanar CT image reconstructions and MIPs were obtained to evaluate the vascular anatomy. Carotid stenosis measurements (when applicable) are obtained utilizing NASCET criteria, using the distal internal carotid diameter as the denominator. Multiphase CT imaging of the brain was performed following IV bolus contrast injection. Subsequent parametric perfusion maps were calculated using RAPID software. CONTRAST:  90 cc Isovue 370 COMPARISON:  03/09/2018, 03/04/2018, 02/16/2018 CT head. 02/16/2018 MRI head. FINDINGS: CTA NECK FINDINGS Aortic arch: Standard branching. Imaged portion shows no evidence of aneurysm or dissection. No significant stenosis of  the major arch vessel origins. Mild calcific aortic atherosclerosis. Right carotid system: No evidence of dissection, stenosis (50% or greater) or occlusion. Mild non stenotic calcific atherosclerosis of the carotid bifurcation. Left carotid system: No evidence of dissection, stenosis (50% or greater) or occlusion. Minimal non stenotic calcific atherosclerosis of the carotid bifurcation. Vertebral arteries: Left dominant, diminutive right vertebral artery. No evidence of dissection, stenosis (50% or greater) or occlusion. Skeleton: Moderate spondylosis of the cervical spine with multilevel disc and facet degenerative changes. No high-grade bony canal stenosis. Other neck: Negative. Upper chest: Negative. Review of the MIP images confirms the above findings CTA HEAD FINDINGS Anterior circulation: No significant stenosis, proximal occlusion, aneurysm, or vascular malformation. Mild non stenotic calcific atherosclerosis of the carotid siphons. Posterior circulation: No significant stenosis, proximal occlusion, aneurysm, or vascular malformation. Venous sinuses: As permitted by contrast timing, patent. Anatomic variants:  Right vertebral artery largely terminates in the right PICA with diminutive contribution to the basilar. Complete circle-of-Willis. Large left A1, large anterior communicating artery, diminutive right A1, normal variant Review of the MIP images confirms the above findings CT Brain Perfusion Findings: CBF (<30%) Volume: 24mL Perfusion (Tmax>6.0s) volume: 81mL Mismatch Volume: 72mL Infarction Location:Left parietal lobe subacute infarction visible on noncontrast CT, pseudonormalized perfusion. IMPRESSION: 1. Patent carotid and vertebral arteries. No dissection, aneurysm, or hemodynamically significant stenosis utilizing NASCET criteria. 2. Patent anterior and posterior intracranial circulation. No large vessel occlusion, aneurysm, or significant stenosis. 3. Left parietal lobe subacute infarction visible on noncontrast CT with pseudonormalized perfusion. 4. No perfusion anomaly to suggest interval acute infarct. Electronically Signed   By: Kristine Garbe M.D.   On: 03/09/2018 18:44   Dg Chest Port 1 View  Result Date: 03/14/2018 CLINICAL DATA:  Followup for pneumothorax. EXAM: PORTABLE CHEST 1 VIEW COMPARISON:  03/13/2018 and older studies. FINDINGS: No pneumothorax. There is left greater than right lung base opacity consistent with a combination of atelectasis and pleural fluid, without significant change from the previous day's study. No new lung abnormalities. Left subclavian central venous line has been removed. IMPRESSION: 1. No pneumothorax. 2. Persistent, left greater than right lung base opacities consistent with combination of pleural fluid and atelectasis. 3. No new abnormalities.  No change from the most recent prior exam. Electronically Signed   By: Lajean Manes M.D.   On: 03/14/2018 08:05   Dg Chest Port 1 View  Result Date: 03/13/2018 CLINICAL DATA:  Status post chest tube removal. EXAM: PORTABLE CHEST 1 VIEW COMPARISON:  Chest CT, 03/12/2018.  Chest radiographs, 03/08/2018. FINDINGS:  Status post left chest tube removal.  No pneumothorax. Persistent lung base opacities, left greater than right, consistent with a combination of pleural fluid and atelectasis. No new lung abnormalities. Left subclavian central venous line is stable and well positioned. IMPRESSION: 1. No pneumothorax following left chest tube removal. 2. Stable, left greater than right, lung base opacity consistent with pleural effusions with atelectasis. Electronically Signed   By: Lajean Manes M.D.   On: 03/13/2018 16:24   Dg Chest Port 1 View  Result Date: 03/08/2018 CLINICAL DATA:  67 year old female with empyema. Subsequent encounter. EXAM: PORTABLE CHEST 1 VIEW COMPARISON:  03/06/2018 chest x-ray and 03/04/2018 chest CT. FINDINGS: Left-sided chest tube is in place. Left chest wall subcutaneous emphysema once again noted. Questionable tiny left apical pneumothorax versus overlapping structures. Persistent consolidation left lung base. This may represent residua of empyema and underlying atelectasis or infiltrate. The gas containing component of empyema noted on recent chest x-ray and prior CT  are not well delineated on the present exam. Endotracheal tube tip 3.4 cm above the carina. Left central line tip distal superior vena cava level. Nasogastric tube courses below the diaphragm. Tip is not included on the present exam. Heart size top-normal slightly enlarged.  Calcified aorta. IMPRESSION: 1. Left-sided chest tube is in place. Left chest wall subcutaneous emphysema once again noted. 2. Questionable tiny left apical pneumothorax versus overlapping structures. 3. Persistent consolidation left lung base. This may represent residua of empyema and underlying atelectasis or infiltrate. The gas containing component of empyema noted on recent chest x-ray and prior CT are not well delineated on the present exam. Electronically Signed   By: Genia Del M.D.   On: 03/08/2018 07:18   Dg Chest Port 1 View  Result Date:  03/06/2018 CLINICAL DATA:  Empyema with [place] EXAM: PORTABLE CHEST 1 VIEW COMPARISON:  05/13/2009 FINDINGS: endotracheal tube, NG tube, and central venous line unchanged. LEFT chest tube in place with LEFT basilar atelectasis and consolidation. Shallow pneumothorax along the lateral chest wall adjacent to the along the tract of the chest tube. Small amount subpulmonic gas additionally. Extensive subcutaneous gas along the LEFT chest wall associated with chest tube. RIGHT lung is clear. IMPRESSION: 1. Small pneumothorax in the lateral LEFT hemithorax adjacent to the chest tube. 2. LEFT basilar atelectasis and consolidation. 3. Stable support apparatus. Electronically Signed   By: Suzy Bouchard M.D.   On: 03/06/2018 08:23   Dg Chest Port 1 View  Result Date: 03/05/2018 CLINICAL DATA:  ET tube and chest tube present, respiratory failure. EXAM: PORTABLE CHEST 1 VIEW COMPARISON:  03/04/2018. FINDINGS: Support tubes and lines remain stable. LEFT chest tube redemonstrated, with slight improved basilar, apical, and lateral pneumothorax. Overall pneumothorax estimated 10% or prep slightly greater. RIGHT lung clear. RIGHT base volume loss with effusion. Cardiomediastinal silhouette unchanged. Subcutaneous air is increased. IMPRESSION: Slight decrease LEFT pneumothorax, with significant residual components throughout the hemithorax. Increased subcutaneous air. Electronically Signed   By: Staci Righter M.D.   On: 03/05/2018 09:10   Dg Chest Port 1 View  Result Date: 03/04/2018 CLINICAL DATA:  Endotracheal tube, nasogastric tube and chest tube placement. EXAM: PORTABLE CHEST 1 VIEW COMPARISON:  Chest x-ray from earlier same day. FINDINGS: LEFT-sided chest tube in place. Improved aeration of the LEFT hemithorax. Persistent pneumothorax at the LEFT lung base, moderate in size, with associated overlying airspace collapse. Small component of the pneumothorax seen at the LEFT lung apex. Dense opacity overlying the LEFT  heart border is also compatible with atelectasis. RIGHT lung is clear. Heart size and mediastinal contours are within normal limits. Endotracheal tube appears well positioned with tip approximately 3 cm above the carina. Enteric tube passes below the diaphragm. LEFT subclavian central line appears well positioned with tip at the level of the lower SVC/cavoatrial junction. IMPRESSION: 1. Support apparatus appears appropriately positioned, as detailed above. 2. Significantly improved aeration of the LEFT hemithorax status post LEFT-sided chest tube placement. Persistent pneumothorax on the LEFT, moderate in size, main component at the LEFT lung base with associated perihilar airspace collapse. Electronically Signed   By: Franki Cabot M.D.   On: 03/04/2018 17:26   Dg Chest Port 1 View  Result Date: 03/04/2018 CLINICAL DATA:  67 year old female with altered mental status, not responding. EXAM: PORTABLE CHEST 1 VIEW COMPARISON:  Portable chest 02/22/2018. FINDINGS: Portable AP upright view at 0854 hours. Near complete opacification of the left hemithorax where as only mild left lung base opacity was present  10 days ago. No shift of the mediastinum. The right lung remains clear allowing for portable technique. Visualized tracheal air column is within normal limits. No pneumothorax. Paucity bowel gas in the upper abdomen. No acute osseous abnormality identified. IMPRESSION: New subtotal opacification of the left hemithorax is nonspecific but suspicious for combination of left lung consolidation and pleural effusion. The right lung remains clear. Electronically Signed   By: Genevie Ann M.D.   On: 03/04/2018 09:41   Dg Chest Port 1 View  Result Date: 02/22/2018 CLINICAL DATA:  Leukocytosis. EXAM: PORTABLE CHEST 1 VIEW COMPARISON:  None. FINDINGS: Subtle asymmetric prominence of the LEFT perihilar shadow, suspicious for perihilar pneumonia and/or lymphadenopathy. Subtle opacity at the LEFT lung base, compatible with  pneumonia or mild atelectasis. RIGHT lung is clear. Osseous structures about the chest are unremarkable. IMPRESSION: 1. Suspected LEFT perihilar pneumonia and/or lymphadenopathy. Consider chest CT for confirmation. 2. Subtle opacity at the LEFT lung base which could represent pneumonia, atelectasis and/or small pleural effusion. Electronically Signed   By: Franki Cabot M.D.   On: 02/22/2018 16:10   Dg Swallowing Func-speech Pathology  Result Date: 03/12/2018 Objective Swallowing Evaluation: Type of Study: MBS-Modified Barium Swallow Study  Patient Details Name: Maria Avila MRN: [id number] Date of Birth: [birth year] Today's Date: 03/12/2018 Time: SLP Start Time (ACUTE ONLY): 1629 -SLP Stop Time (ACUTE ONLY): 1655 SLP Time Calculation (min) (ACUTE ONLY): 26 min Past Medical History: Past Medical History: Diagnosis Date . Colitis  . Diabetes mellitus without complication ([place])  . Hypertension  . Malnutrition ([place])  Past Surgical History: Past Surgical History: Procedure Laterality Date . BIOPSY  02/26/2018  Procedure: BIOPSY;  Surgeon: Wilford Corner, MD;  Location: [place];  Service: Endoscopy;; . COLONOSCOPY WITH PROPOFOL N/A 02/26/2018  Procedure: COLONOSCOPY WITH PROPOFOL;  Surgeon: Wilford Corner, MD;  Location: [place];  Service: Endoscopy;  Laterality: N/A; . ESOPHAGOGASTRODUODENOSCOPY (EGD) WITH PROPOFOL N/A 02/26/2018  Procedure: ESOPHAGOGASTRODUODENOSCOPY (EGD) WITH PROPOFOL;  Surgeon: Wilford Corner, MD;  Location: [place];  Service: Endoscopy;  Laterality: N/A; . NO PAST SURGERIES    UNCERTAIN OF NAME OF SURGERY HPI: Pt is a 67 year old admitted with respiratory failure, left lung empyema s/p CT placement 10/31, and septic shock. ETT 10/31-11/4. MRI 11/6 showed multifocal, bilateral acute ischemia with largest infarction in the L parietal lobe. Additional areas include the L frontal lobe, R parietal lobe, and pons. PMH: malnutrition and unexplained FTT (s/p recent GI and psych  consults), HTN, DM  Subjective: pt alert but confused Assessment / Plan / Recommendation [facility] IP CLINICAL IMPRESSIONS 03/12/2018 Clinical Impression Pt has a mild-moderate oral dysphagia that is felt to be at least in part due to cognitive status. She has reduced bolus cohesion, slow posterior transit, premature spillage particularly wtih thin liquids, and lingual residue. Occasional oral holding was noted, requiring only Min cues from SLP. With extra time and use of additional swallows she is able to sufficiently clear her oral cavity, but it is cumbersome. Her pharyngeal phase is timely and strong with good airway protection and clearance. Recommend starting Dys 1 diet and thin liquids with full supervision. SLP to f/u clinically for readiness to advance solids. Will also f/u for completion of speech/language evaluation. SLP Visit Diagnosis Dysphagia, oral phase (R13.11) Attention and concentration deficit following -- Frontal lobe and executive function deficit following -- Impact on safety and function Mild aspiration risk   CH[facility] IP TREATMENT RECOMMENDATION 03/12/2018 Treatment Recommendations Therapy as outlined in treatment plan below   Prognosis 03/12/2018  Prognosis for Safe Diet Advancement Good Barriers to Reach Goals Cognitive deficits Barriers/Prognosis Comment -- [facility] IP DIET RECOMMENDATION 03/12/2018 SLP Diet Recommendations Dysphagia 1 (Puree) solids;Thin liquid Liquid Administration via Straw;Cup Medication Administration Crushed with puree Compensations Slow rate;Small sips/bites Postural Changes Seated upright at 90 degrees   CHL IP OTHER RECOMMENDATIONS 03/12/2018 Recommended Consults -- Oral Care Recommendations Oral care BID Other Recommendations Have oral suction available   CH[facility] IP FOLLOW UP RECOMMENDATIONS 03/12/2018 Follow up Recommendations Skilled Nursing facility   [facility] IP FREQUENCY AND DURATION 03/12/2018 Speech Therapy Frequency (ACUTE ONLY) min 2x/week Treatment Duration 2 weeks      CH[facility] IP ORAL  PHASE 03/12/2018 Oral Phase Impaired Oral - Pudding Teaspoon -- Oral - Pudding Cup -- Oral - Honey Teaspoon -- Oral - Honey Cup -- Oral - Nectar Teaspoon -- Oral - Nectar Cup -- Oral - Nectar Straw -- Oral - Thin Teaspoon -- Oral - Thin Cup Holding of bolus;Lingual/palatal residue;Delayed oral transit;Decreased bolus cohesion;Premature spillage Oral - Thin Straw Holding of bolus;Lingual/palatal residue;Delayed oral transit;Decreased bolus cohesion;Premature spillage Oral - Puree Holding of bolus;Lingual/palatal residue;Delayed oral transit;Decreased bolus cohesion;Premature spillage Oral - Mech Soft -- Oral - Regular -- Oral - Multi-Consistency -- Oral - Pill -- Oral Phase - Comment --  CH[facility] IP PHARYNGEAL PHASE 03/12/2018 Pharyngeal Phase WFL Pharyngeal- Pudding Teaspoon -- Pharyngeal -- Pharyngeal- Pudding Cup -- Pharyngeal -- Pharyngeal- Honey Teaspoon -- Pharyngeal -- Pharyngeal- Honey Cup -- Pharyngeal -- Pharyngeal- Nectar Teaspoon -- Pharyngeal -- Pharyngeal- Nectar Cup -- Pharyngeal -- Pharyngeal- Nectar Straw -- Pharyngeal -- Pharyngeal- Thin Teaspoon -- Pharyngeal -- Pharyngeal- Thin Cup -- Pharyngeal -- Pharyngeal- Thin Straw -- Pharyngeal -- Pharyngeal- Puree -- Pharyngeal -- Pharyngeal- Mechanical Soft -- Pharyngeal -- Pharyngeal- Regular -- Pharyngeal -- Pharyngeal- Multi-consistency -- Pharyngeal -- Pharyngeal- Pill -- Pharyngeal -- Pharyngeal Comment --  CH[facility] IP CERVICAL ESOPHAGEAL PHASE 03/12/2018 Cervical Esophageal Phase WFL Pudding Teaspoon -- Pudding Cup -- Honey Teaspoon -- Honey Cup -- Nectar Teaspoon -- Nectar Cup -- Nectar Straw -- Thin Teaspoon -- Thin Cup -- Thin Straw -- Puree -- Mechanical Soft -- Regular -- Multi-consistency -- Pill -- Cervical Esophageal Comment -- Germain Osgood 03/12/2018, 5:23 PM  Germain Osgood, M.A. CCC-SLP Acute Rehabilitation Services Pager [phone] Office [phone]             Vas [place] Transcranial Doppler W Bubbles  Result Date: 03/11/2018   Transcranial Doppler with Bubble Indications: Stroke. Performing Technologist: Landry Mellow RDMS, RVT  Examination Guidelines: A complete evaluation includes B-mode imaging, spectral Doppler, color Doppler, and power Doppler as needed of all accessible portions of each vessel. Bilateral testing is considered an integral part of a complete examination. Limited examinations for reoccurring indications may be performed as noted.  Summary:  A vascular evaluation was performed. The right middle cerebral artery was studied. An IV was inserted into the patient's right upper arm PICC line. Verbal informed consent was obtained.  Limited study due to poor patient cooperation, unable to perform Valsalva. No HITS heard at rest. No evidence of PFO with this test. Negative TCD Bubble study *See table(s) above for measurements and observations.  Diagnosing physician: Antony Contras MD Electronically signed by Antony Contras MD on 03/11/2018 at 3:51:31 PM.    Final    Vas [place] Lower Extremity Venous (dvt)  Result Date: 03/10/2018  Lower Venous Study Indications: Stroke, and embolic stroke.  Limitations: Body habitus, bandages, line and adjecent arterial cacification shadowing,severe edema and immobility. Performing Technologist: Lorina Rabon  Examination  Guidelines: A complete evaluation includes B-mode imaging, spectral Doppler, color Doppler, and power Doppler as needed of all accessible portions of each vessel. Bilateral testing is considered an integral part of a complete examination. Limited examinations for reoccurring indications may be performed as noted.  Right Venous Findings: +---------+---------------+---------+-----------+----------+-------------------+          CompressibilityPhasicitySpontaneityPropertiesSummary             +---------+---------------+---------+-----------+----------+-------------------+ CFV      Full           Yes      Yes                                       +---------+---------------+---------+-----------+----------+-------------------+ SFJ      Full                                                             +---------+---------------+---------+-----------+----------+-------------------+ FV Prox  Full                                                             +---------+---------------+---------+-----------+----------+-------------------+ FV Mid   Full                                                             +---------+---------------+---------+-----------+----------+-------------------+ FV DistalFull                                                             +---------+---------------+---------+-----------+----------+-------------------+ PFV      Full                                                             +---------+---------------+---------+-----------+----------+-------------------+ POP      Full           Yes      Yes                                      +---------+---------------+---------+-----------+----------+-------------------+ PTV      None                    No                   Acute               +---------+---------------+---------+-----------+----------+-------------------+ PERO  not well visualized +---------+---------------+---------+-----------+----------+-------------------+  Left Venous Findings: +---------+---------------+---------+-----------+----------+-------------------+          CompressibilityPhasicitySpontaneityPropertiesSummary             +---------+---------------+---------+-----------+----------+-------------------+ CFV      Full           Yes      Yes                                      +---------+---------------+---------+-----------+----------+-------------------+ SFJ      Full                                                              +---------+---------------+---------+-----------+----------+-------------------+ FV Prox  Full                                                             +---------+---------------+---------+-----------+----------+-------------------+ FV Mid   Full                                                             +---------+---------------+---------+-----------+----------+-------------------+ FV DistalFull                                                             +---------+---------------+---------+-----------+----------+-------------------+ PFV      Full                                                             +---------+---------------+---------+-----------+----------+-------------------+ POP      Full           Yes      Yes                                      +---------+---------------+---------+-----------+----------+-------------------+ PTV                                                   not well visualized +---------+---------------+---------+-----------+----------+-------------------+ PERO     None                    No                   Acute               +---------+---------------+---------+-----------+----------+-------------------+  Summary: Right: Findings consistent with acute deep vein thrombosis involving the right posterior tibial vein. No cystic structure found in the popliteal fossa. Left: Findings consistent with acute deep vein thrombosis involving the left peroneal vein. No cystic structure found in the popliteal fossa.  *See table(s) above for measurements and observations. Electronically signed by Curt Jews MD on 03/10/2018 at 2:11:56 PM.    Final    [place] Ekg Site Rite  Result Date: 03/12/2018 If Site Rite image not attached, placement could not be confirmed due to current cardiac rhythm.  Dg Esophagus W/water Sol Cm  Result Date: 03/12/2018 CLINICAL DATA:  67 year old female with clinical concern for possible esophageal  perforation. EXAM: ESOPHOGRAM/BARIUM SWALLOW TECHNIQUE: Single contrast examination was performed using thin barium or water soluble. FLUOROSCOPY TIME:  Fluoroscopy Time:  1 minutes and 24 seconds Radiation Exposure Index (if provided by the fluoroscopic device): 16.5 mGy COMPARISON:  None. FINDINGS: Limited single contrast esophagram with water-soluble contrast material (Omnipaque 300) performed. Failure to fully propagate any normal primary peristaltic waves. Extensive tertiary contractions. No gross evidence of esophageal mass, stricture or esophageal ring. No hiatal hernia. No extravasation of contrast material from the lumen of the esophagus at any time during the examination. IMPRESSION: 1. No findings to suggest esophageal perforation. 2. Nonspecific esophageal motility disorder with severe tertiary contractions. Electronically Signed   By: Vinnie Langton M.D.   On: 03/12/2018 10:55     Phillips Climes M.D on 03/14/2018 at 11:39 AM  Between 7am to 7pm - Pager - [phone]  After 7pm go to [email or web address] - password [facility]  [facility] -  Office  [phone]

## 2018-03-14 NOTE — Progress Notes (Signed)
Unable to weigh patient in bed, pt cannot stand. Will pass on to day shift.

## 2018-03-15 DIAGNOSIS — I10 Essential (primary) hypertension: Secondary | ICD-10-CM

## 2018-03-15 DIAGNOSIS — I824Z3 Acute embolism and thrombosis of unspecified deep veins of distal lower extremity, bilateral: Secondary | ICD-10-CM

## 2018-03-15 DIAGNOSIS — D72829 Elevated white blood cell count, unspecified: Secondary | ICD-10-CM

## 2018-03-15 DIAGNOSIS — R000 Tachycardia, unspecified: Secondary | ICD-10-CM

## 2018-03-15 DIAGNOSIS — I6340 Cerebral infarction due to embolism of unspecified cerebral artery: Secondary | ICD-10-CM

## 2018-03-15 DIAGNOSIS — I69391 Dysphagia following cerebral infarction: Secondary | ICD-10-CM

## 2018-03-15 DIAGNOSIS — I82453 Acute embolism and thrombosis of peroneal vein, bilateral: Secondary | ICD-10-CM

## 2018-03-15 LAB — VITAMIN B12: Vitamin B-12: 945 pg/mL — ABNORMAL HIGH (ref 180–914)

## 2018-03-15 LAB — GLUCOSE, CAPILLARY
Glucose-Capillary: 100 mg/dL — ABNORMAL HIGH (ref 70–99)
Glucose-Capillary: 122 mg/dL — ABNORMAL HIGH (ref 70–99)
Glucose-Capillary: 58 mg/dL — ABNORMAL LOW (ref 70–99)
Glucose-Capillary: 76 mg/dL (ref 70–99)
Glucose-Capillary: 88 mg/dL (ref 70–99)
Glucose-Capillary: 99 mg/dL (ref 70–99)

## 2018-03-15 LAB — CBC
HCT: 26 % — ABNORMAL LOW (ref 36.0–46.0)
Hemoglobin: 8.5 g/dL — ABNORMAL LOW (ref 12.0–15.0)
MCH: 30.1 pg (ref 26.0–34.0)
MCHC: 32.7 g/dL (ref 30.0–36.0)
MCV: 92.2 fL (ref 80.0–100.0)
NRBC: 0 % (ref 0.0–0.2)
PLATELETS: 276 10*3/uL (ref 150–400)
RBC: 2.82 MIL/uL — ABNORMAL LOW (ref 3.87–5.11)
RDW: 16.4 % — AB (ref 11.5–15.5)
WBC: 14.1 10*3/uL — AB (ref 4.0–10.5)

## 2018-03-15 LAB — FOLATE: Folate: 9 ng/mL (ref >5.9)

## 2018-03-15 NOTE — Progress Notes (Signed)
Physical Therapy Treatment Patient Details Name: Maria Avila MRN: [id number] DOB: 05/10/1950 Today's Date: 03/15/2018    History of Present Illness 67yo female coming from [facility] due to decreased responsiveness, found to by hypoxic and hypoglycemic, also in respiratory shock and respiratory failure with emphyema and concern for esophageal rupture. Intubated on 03/04/18, extubated on 03/09/18. Found to have B DVTs on 11/6 and now on heparin. CT positive for acute L parietal infarcts. PMH HTN, DM     PT Comments    Pt able to maintain sitting EOB today with min-guard A and occasional min A for posterior LOB. Improvement made from last session. Pt's son shared video of day before readmission when pt ambulated 22 steps with therapists and RW, pt motivated to ambulate again. Stedy used for sit to stand, pt required +2 max A to stand to stedy. Pt needed maual facilitation for proper placement of feet and fwd wt shifting. PT will continue to follow.     Follow Up Recommendations  CIR     Equipment Recommendations  Other (comment)(defer to next venue )    Recommendations for Other Services Rehab consult     Precautions / Restrictions Precautions Precautions: Fall;Other (comment) Precaution Comments: flexiseal  Restrictions Weight Bearing Restrictions: No    Mobility  Bed Mobility Overal bed mobility: Needs Assistance Bed Mobility: Supine to Sit     Supine to sit: +2 for physical assistance;Mod assist     General bed mobility comments: pt able to grasp R rail with L hand and intiate LE mvmt with vc's. Needed mod A +2 at hips and trunk for getting to EOB  Transfers Overall transfer level: Needs assistance Equipment used: None Transfers: Sit to/from [facility] Sit to Stand: +2 safety/equipment;Max assist Stand pivot transfers: +2 physical assistance;Total assist       General transfer comment: pt was able to grasp stedy and pull for sit to  stand but still needed max A +2 for power up and getting hips fwd enough to put flaps of stedy down. Had an easier time standing from elevated surface of stedy to sit to chair.   Ambulation/Gait             General Gait Details:  unable due to fatigue and weakness   Stairs             Wheelchair Mobility    Modified Rankin (Stroke Patients Only)       Balance Overall balance assessment: Needs assistance Sitting-balance support: Bilateral upper extremity supported;Feet supported Sitting balance-Leahy Scale: Poor Sitting balance - Comments: pt needing min A initially with posterior LOB but progressed to min-guard with ability to wt shift fwd to reach LE's. See OT note for further detail Postural control: Posterior lean Standing balance support: Bilateral upper extremity supported Standing balance-Leahy Scale: Zero Standing balance comment: max A to maintain standing position in stedy with knees supported                            Cognition Arousal/Alertness: Awake/alert Behavior During Therapy: Flat affect Overall Cognitive Status: Impaired/Different from baseline Area of Impairment: Attention;Memory;Following commands;Safety/judgement;Awareness;Problem solving                   Current Attention Level: Sustained Memory: Decreased short-term memory;Decreased recall of precautions Following Commands: Follows one step commands with increased time;Follows one step commands consistently Safety/Judgement: Decreased awareness of safety;Decreased awareness of deficits Awareness: Intellectual Problem  Solving: Slow processing;Difficulty sequencing;Requires verbal cues;Requires tactile cues General Comments: fatigues very easily. Confusion noted during session when pt stated that the lotion bottle in her hand was burning.       Exercises General Exercises - Lower Extremity Ankle Circles/Pumps: AROM;Both;10 reps;Seated    General Comments         Pertinent Vitals/Pain Pain Assessment: Faces Faces Pain Scale: No hurt    Home Living                      Prior Function            PT Goals (current goals can now be found in the care plan section) Acute Rehab PT Goals Patient Stated Goal: get stronger  PT Goal Formulation: With patient/family Time For Goal Achievement: 03/26/18 Potential to Achieve Goals: Fair Progress towards PT goals: Progressing toward goals    Frequency    Min 3X/week      PT Plan Current plan remains appropriate    Co-evaluation PT/OT/SLP Co-Evaluation/Treatment: Yes Reason for Co-Treatment: For patient/therapist safety;Complexity of the patient's impairments (multi-system involvement);Necessary to address cognition/behavior during functional activity PT goals addressed during session: Mobility/safety with mobility;Strengthening/ROM        AM-PAC PT "6 Clicks" Daily Activity  Outcome Measure  Difficulty turning over in bed (including adjusting bedclothes, sheets and blankets)?: Unable Difficulty moving from lying on back to sitting on the side of the bed? : Unable Difficulty sitting down on and standing up from a chair with arms (e.g., wheelchair, bedside commode, etc,.)?: Unable Help needed moving to and from a bed to chair (including a wheelchair)?: Total Help needed walking in hospital room?: Total Help needed climbing 3-5 steps with a railing? : Total 6 Click Score: 6    End of Session Equipment Utilized During Treatment: Gait belt Activity Tolerance: Patient limited by fatigue Patient left: with call bell/phone within reach;with family/visitor present;in chair;with bed alarm set Nurse Communication: Mobility status;Other (comment)(stedy for back to bed) PT Visit Diagnosis: Muscle weakness (generalized) (M62.81);Adult, failure to thrive (R62.7);Other abnormalities of gait and mobility (R26.89);History of falling (Z91.81);Unsteadiness on feet (R26.81);Difficulty in walking,  not elsewhere classified (R26.2)     Time: 1001-1030 PT Time Calculation (min) (ACUTE ONLY): 29 min  Charges:  $Therapeutic Activity: 8-22 mins                     Leighton Roach, PT  Acute Rehab Services  Pager [phone] Office [place] 03/15/2018, 12:28 PM

## 2018-03-15 NOTE — Care Management Important Message (Signed)
Important Message  Patient Details  Name: Kianah E Martinique MRN: [id number] Date of Birth: [birth year]   Medicare Important Message Given:  Yes    Barb Merino Jamell Opfer 03/15/2018, 5:21 PM

## 2018-03-15 NOTE — Progress Notes (Signed)
Inpatient Rehabilitation-Admissions Coordinator    Met with patient and her husband at the bedside to discuss team's recommendation for inpatient rehabilitation. Shared booklets, expectations while in CIR, expected length of stay, and anticipated functional level at DC. Per husband, he is able to provide the assist recommended at DC. At this time, pt and her husband would like to pursue CIR.  AC discussed how I would follow along for therapy tolerance and await insurance authorization for possible admit.   Please call if questions.   Jhonnie Garner, OTR/L  Rehab Admissions Coordinator  [phone] 03/15/2018 5:24 PM

## 2018-03-15 NOTE — Progress Notes (Signed)
PROGRESS NOTE                                                                                                                                                                                                             Patient Demographics:    Maria Avila, is a 67 y.o. female, DOB - 1950-11-02, NID:[id number]  Admit date - 03/04/2018   Admitting Physician [name], MD  Outpatient Primary MD for the patient is [name], MD  LOS - 11   Chief Complaint  Patient presents with  . Altered Mental Status       Brief Narrative    67 y.o. female with history of malnutrition, hypertension, diabetes and colitis , was admitted to ICU 03/04/2018 due to septic shock from empyema, patient with recent hospitalization letter to that significant for endoscopy with no acute findings, she remained in SNF for 4 days, then she presents with septic shock, work-up significant for empyema, requiring pressors, intubation, became off pressors, extubated, then she was noted to have moving her right side, and had left gaze preference, work-up significant for acute CVA, embolic, as well she had acute DVT, she was transferred to [facility] care 03/11/2018.  10/31 ETT placed 10/31 Chest tube placed 11/1 TPA/pulmozyme administration  11/4 extubated   Subjective:    Maria Avila today denies any complaints, flex seal was inserted yesterday, appears to be empty,   Assessment  & Plan :    Active Problems:   Severe protein-calorie malnutrition (HCC)   Empyema (HCC)   Pressure injury of skin   Acute respiratory failure with hypercapnia (HCC)   Malnutrition of moderate degree   Cerebral embolism with cerebral infarction   Chest tube in place   Esophageal perforation   Perforation esophagus   Sepsis with acute renal failure (HCC)   Acute deep vein thrombosis (DVT) of both peroneal veins   Leukocytosis   Sepsis due to Pseudomonas species (HCC)   Dysphagia,  post-stroke   Benign essential HTN   Acute deep vein thrombosis (DVT) of distal vein of both lower extremities (HCC)   Sinus tachycardia  Septic shock due to empyema -Present on admission, requiring pressure support, CT chest showing free air and large pleural effusion, status post chest tube 07/03/2017,. - intubated on admission 03/04/2018, extubated 03/09/2018 -Empyema culture growing Pseudomonas, E. coli,  enterococcus, initially on Unasyn on Unasyn, transitioned 03/12/2018 on Zosyn, cytosis trending down, will discuss with ID stop date, there is any need for prolonged oral regimen. -Chest tube management per PCCM, tube discontinued 03/13/2018 by pulmonary.  As there is a small residual effusion with no clear evidence of loculation, chest tube was discontinued as it is not draining, as well as nonfunctional due to being kinked. -This is most likely in the setting of aspiration pneumonia, no evidence of esophageal perforation on esophgiogram, she was started on a diet after her barium swallow evaluation. -TPN stopped 03/13/2018, and has been on diet, has been advanced by SLP, improving  Acute CVA - Left parietal small infarcts, left MCA/ACA, right parietal and pontine punctate infarcts, -Most likely embolic pattern, cardioembolic versus DVT in the setting of PFO, versus endocarditis, versus hypercoagulable state due to severe sepsis. -on heparin for her lower extremity DVT, TCD bubble study with no PFO at rest, patient not cooperative on Valsalva test , now there is no further procedures anticipated, now on Eliquis  Acute respiratory failure -Due to empyema and septic shock on admission, requiring intubation, currently extubated, back to room air  Bilateral lower extremity DVT -Lower extremity venous Doppler showing bilateral lower extremity DVT with right posterior tibial veins and left peroneal veins distally -TCD bubble study no PFO at rest, not cooperative on valsalva test -Now she can take  oral, no further procedures are anticipated, she was switched from heparin gtt. to Eliquis  Diabetes -Patient with history of diabetes mellitus, but her A1c is 5.1, never required insulin, actually her CBG on the lower side intermittently, so I have discontinued her sliding scale  Protein calorie malnutrition -Was on TPN, currently on dysphagia 3 diet, appetite is improving, encouraged to drink Ensure today  Normocytic anemia -He was transfused 1 unit PRBC for hemoglobin of 7.7, globin remained stable, she does report some tingling and numbness in lower extremity, I will check B12.  Code Status : partial  Family Communication  : husband at bedside  Disposition Plan  : cir CONSULTED  Consults  : PCCM, CT surgery, neuurology  Procedures  :  10/31 ETT placed 10/31 Chest tube placed, removed 03/13/2018 11/1 TPA/pulmozyme administration  11/4 extubated  Left subclavian central line will be discontinued 01/2018  DVT Prophylaxis  :  eliquis  Lab Results  Component Value Date   PLT 276 03/15/2018    Antibiotics  :    Anti-infectives (From admission, onward)   Start     Dose/Rate Route Frequency Ordered Stop   03/12/18 1400  piperacillin-tazobactam (ZOSYN) IVPB 3.375 g     3.375 g 12.5 mL/hr over 240 Minutes Intravenous Every 8 hours 03/12/18 1332     03/07/18 1000  Ampicillin-Sulbactam (UNASYN) 3 g in sodium chloride 0.9 % 100 mL IVPB  Status:  Discontinued     3 g 200 mL/hr over 30 Minutes Intravenous Every 8 hours 03/07/18 0948 03/12/18 1332   03/05/18 1200  vancomycin (VANCOCIN) 500 mg in sodium chloride 0.9 % 100 mL IVPB  Status:  Discontinued     500 mg 100 mL/hr over 60 Minutes Intravenous Every 24 hours 03/04/18 1801 03/07/18 0948   03/04/18 2200  piperacillin-tazobactam (ZOSYN) IVPB 3.375 g  Status:  Discontinued     3.375 g 12.5 mL/hr over 240 Minutes Intravenous Every 8 hours 03/04/18 1801 03/07/18 0948   03/04/18 0915  ceFEPIme (MAXIPIME) 2 g in sodium chloride 0.9  % 100 mL IVPB  2 g 200 mL/hr over 30 Minutes Intravenous  Once 03/04/18 0910 03/04/18 1000   03/04/18 0915  metroNIDAZOLE (FLAGYL) IVPB 500 mg  Status:  Discontinued     500 mg 100 mL/hr over 60 Minutes Intravenous Every 8 hours 03/04/18 0910 03/04/18 1801   03/04/18 0915  vancomycin (VANCOCIN) IVPB 1000 mg/200 mL premix     1,000 mg 200 mL/hr over 60 Minutes Intravenous  Once 03/04/18 0910 03/04/18 1338        Objective:   Vitals:   03/14/18 0330 03/14/18 1130 03/14/18 2014 03/15/18 0359  BP: 117/86 109/78 134/85 120/89  Pulse: (!) 103 95 98 (!) 103  Resp:  18 20   Temp: 98.2 F (36.8 C)  98.3 F (36.8 C) 98.3 F (36.8 C)  TempSrc: Oral  Oral Oral  SpO2: 100% 100% 100% 100%  Weight:      Height:        Wt Readings from Last 3 Encounters:  03/10/18 65.1 kg  02/16/18 48.7 kg  02/05/18 48.1 kg     Intake/Output Summary (Last 24 hours) at 03/15/2018 1536 Last data filed at 03/15/2018 1401 Gross per 24 hour  Intake 811.55 ml  Output 1975 ml  Net -1163.45 ml     Physical Exam  Awake Alert, pleasant, appropriate, no apparent distress Symmetrical Chest wall movement, Good air movement bilaterally, CTAB RRR,No Gallops,Rubs or new Murmurs, No Parasternal Heave +ve B.Sounds, Abd Soft, No tenderness, No rebound - guarding or rigidity. No Cyanosis, Clubbing or edema, No new Rash or bruise     Left subclavian discontinued 03/13/2018, chest tube discontinued 03/13/2018    Data Review:    CBC Recent Labs  Lab 03/09/18 0350 03/10/18 0451 03/11/18 0500 03/12/18 0330 03/13/18 0234 03/14/18 0351 03/15/18 0406  WBC 12.4* 14.2* 17.4* 18.0* 17.9* 16.3* 14.1*  HGB 8.2* 8.4* 8.6* 7.7* 9.5* 9.0* 8.5*  HCT 24.6* 26.0* 25.9* 23.6* 29.5* 27.7* 26.0*  PLT 122* 135* 165 173 206 243 276  MCV 90.1 90.9 92.8 94.0 92.8 93.9 92.2  MCH 30.0 29.4 30.8 30.7 29.9 30.5 30.1  MCHC 33.3 32.3 33.2 32.6 32.2 32.5 32.7  RDW 15.6* 15.6* 16.1* 16.3* 16.1* 16.8* 16.4*  LYMPHSABS 0.9 1.7   --   --   --   --   --   MONOABS 0.1 0.6  --   --   --   --   --   EOSABS 0.1 0.0  --   --   --   --   --   BASOSABS 0.0 0.0  --   --   --   --   --     Chemistries  Recent Labs  Lab 03/09/18 0350 03/10/18 0451 03/11/18 0500 03/12/18 0330 03/13/18 0234 03/14/18 0351  NA 143 139 140 138 138 137  K 3.1* 3.7 3.9 4.0 4.1 4.2  CL 115* 108 110 108 111 111  CO2 24 28 25 26 24 22   GLUCOSE 123* 125* 80 106* 87 91  BUN 19 17 14 14 17 16   CREATININE 0.66 0.42* 0.40* 0.39* 0.48 0.58  CALCIUM 8.1* 7.8* 7.7* 7.6* 7.8* 7.8*  MG 2.0  --  1.7 1.7  --   --   AST 55* 49* 34  --   --   --   ALT 50* 49* 36  --   --   --   ALKPHOS 475* 408* 326*  --   --   --   BILITOT 0.8 0.7 0.5  --   --   --    ------------------------------------------------------------------------------------------------------------------  No results for input(s): CHOL, HDL, LDLCALC, TRIG, CHOLHDL, LDLDIRECT in the last 72 hours.  Lab Results  Component Value Date   HGBA1C 5.1 03/10/2018   ------------------------------------------------------------------------------------------------------------------ No results for input(s): TSH, T4TOTAL, T3FREE, THYROIDAB in the last 72 hours.  Invalid input(s): FREET3 ------------------------------------------------------------------------------------------------------------------ No results for input(s): VITAMINB12, FOLATE, FERRITIN, TIBC, IRON, RETICCTPCT in the last 72 hours.  Coagulation profile No results for input(s): INR, PROTIME in the last 168 hours.  No results for input(s): DDIMER in the last 72 hours.  Cardiac Enzymes No results for input(s): CKMB, TROPONINI, MYOGLOBIN in the last 168 hours.  Invalid input(s): CK ------------------------------------------------------------------------------------------------------------------ No results found for: BNP  Inpatient Medications  Scheduled Meds: . apixaban  10 mg Oral BID   Followed by  . [START ON 03/20/2018]  apixaban  5 mg Oral BID  . chlorhexidine  15 mL Mouth Rinse BID  . Chlorhexidine Gluconate Cloth  6 each Topical Daily  . collagenase   Topical Daily  . feeding supplement (ENSURE ENLIVE)  237 mL Oral TID BM  . Gerhardt's butt cream   Topical Daily  . mouth rinse  15 mL Mouth Rinse q12n4p  . pantoprazole (PROTONIX) IV  40 mg Intravenous Q24H  . sodium chloride flush  10-40 mL Intracatheter Q12H   Continuous Infusions: . sodium chloride Stopped (03/04/18 2139)  . sodium chloride 75 mL/hr at 03/15/18 0639  . piperacillin-tazobactam (ZOSYN)  IV 3.375 g (03/15/18 1506)   PRN Meds:.bisacodyl, sodium chloride flush  Micro Results No results found for this or any previous visit (from the past 240 hour(s)).  Radiology Reports Ct Angio Head W Or Wo Contrast  Result Date: 03/09/2018 CLINICAL DATA:  67 y/o F; patient extubated yesterday. Right-sided weakness and slow speech. Increased lethargy. EXAM: CT ANGIOGRAPHY HEAD AND NECK CT PERFUSION BRAIN TECHNIQUE: Multidetector CT imaging of the head and neck was performed using the standard protocol during bolus administration of intravenous contrast. Multiplanar CT image reconstructions and MIPs were obtained to evaluate the vascular anatomy. Carotid stenosis measurements (when applicable) are obtained utilizing NASCET criteria, using the distal internal carotid diameter as the denominator. Multiphase CT imaging of the brain was performed following IV bolus contrast injection. Subsequent parametric perfusion maps were calculated using RAPID software. CONTRAST:  90 cc Isovue 370 COMPARISON:  03/09/2018, 03/04/2018, 02/16/2018 CT head. 02/16/2018 MRI head. FINDINGS: CTA NECK FINDINGS Aortic arch: Standard branching. Imaged portion shows no evidence of aneurysm or dissection. No significant stenosis of the major arch vessel origins. Mild calcific aortic atherosclerosis. Right carotid system: No evidence of dissection, stenosis (50% or greater) or occlusion.  Mild non stenotic calcific atherosclerosis of the carotid bifurcation. Left carotid system: No evidence of dissection, stenosis (50% or greater) or occlusion. Minimal non stenotic calcific atherosclerosis of the carotid bifurcation. Vertebral arteries: Left dominant, diminutive right vertebral artery. No evidence of dissection, stenosis (50% or greater) or occlusion. Skeleton: Moderate spondylosis of the cervical spine with multilevel disc and facet degenerative changes. No high-grade bony canal stenosis. Other neck: Negative. Upper chest: Negative. Review of the MIP images confirms the above findings CTA HEAD FINDINGS Anterior circulation: No significant stenosis, proximal occlusion, aneurysm, or vascular malformation. Mild non stenotic calcific atherosclerosis of the carotid siphons. Posterior circulation: No significant stenosis, proximal occlusion, aneurysm, or vascular malformation. Venous sinuses: As permitted by contrast timing, patent. Anatomic variants: Right vertebral artery largely terminates in the right PICA with diminutive contribution to the basilar. Complete circle-of-Willis. Large left A1, large anterior communicating artery, diminutive right A1,  normal variant Review of the MIP images confirms the above findings CT Brain Perfusion Findings: CBF (<30%) Volume: 47mL Perfusion (Tmax>6.0s) volume: 58mL Mismatch Volume: 82mL Infarction Location:Left parietal lobe subacute infarction visible on noncontrast CT, pseudonormalized perfusion. IMPRESSION: 1. Patent carotid and vertebral arteries. No dissection, aneurysm, or hemodynamically significant stenosis utilizing NASCET criteria. 2. Patent anterior and posterior intracranial circulation. No large vessel occlusion, aneurysm, or significant stenosis. 3. Left parietal lobe subacute infarction visible on noncontrast CT with pseudonormalized perfusion. 4. No perfusion anomaly to suggest interval acute infarct. Electronically Signed   By: Kristine Garbe M.D.   On: 03/09/2018 18:44   Ct Head Wo Contrast  Result Date: 03/09/2018 CLINICAL DATA:  Residual right-sided weakness. EXAM: CT HEAD WITHOUT CONTRAST TECHNIQUE: Contiguous axial images were obtained from the base of the skull through the vertex without intravenous contrast. COMPARISON:  03/04/2018 CT.  02/16/2018 MRI. FINDINGS: Brain: There is acute to subacute infarction in the left parietal lobe consistent with left MCA branch vessel infarction. The brain shows low-density with mild swelling. No hemorrhage. No other abnormal brain finding. No mass, hydrocephalus or extra-axial collection. Vascular: There is atherosclerotic calcification of the major vessels at the base of the brain. Skull: Negative Sinuses/Orbits: Clear/normal Other: None IMPRESSION: Acute/subacute infarction in the left parietal lobe consistent with MCA branch vessel infarction. No evidence of hemorrhage or mass effect. Electronically Signed   By: Nelson Chimes M.D.   On: 03/09/2018 13:31   Ct Head Wo Contrast  Result Date: 03/04/2018 CLINICAL DATA:  Altered level of consciousness EXAM: CT HEAD WITHOUT CONTRAST TECHNIQUE: Contiguous axial images were obtained from the base of the skull through the vertex without intravenous contrast. COMPARISON:  02/16/2018 FINDINGS: Brain: No evidence of acute infarction, hemorrhage, hydrocephalus, extra-axial collection or mass lesion/mass effect. Vascular: No hyperdense vessel or unexpected calcification. Skull: Normal. Negative for fracture or focal lesion. Sinuses/Orbits: No acute finding. Other: None. IMPRESSION: No acute abnormality noted.  No change from the prior exam. Electronically Signed   By: Inez Catalina M.D.   On: 03/04/2018 09:33   Ct Head Wo Contrast  Result Date: 02/16/2018 CLINICAL DATA:  Generalize weakness and slurred speech for 2 weeks. EXAM: CT HEAD WITHOUT CONTRAST TECHNIQUE: Contiguous axial images were obtained from the base of the skull through the  vertex without intravenous contrast. COMPARISON:  04/01/2005 FINDINGS: Brain: No evidence of acute infarction, hemorrhage, hydrocephalus, extra-axial collection or mass lesion/mass effect. Vascular: Mild intracranial arterial vascular calcifications. Skull: Calvarium appears intact. Sinuses/Orbits: Paranasal sinuses and mastoid air cells are clear. Other: None. IMPRESSION: No acute intracranial abnormality. Electronically Signed   By: Lucienne Capers M.D.   On: 02/16/2018 06:24   Ct Angio Neck W Or Wo Contrast  Result Date: 03/09/2018 CLINICAL DATA:  67 y/o F; patient extubated yesterday. Right-sided weakness and slow speech. Increased lethargy. EXAM: CT ANGIOGRAPHY HEAD AND NECK CT PERFUSION BRAIN TECHNIQUE: Multidetector CT imaging of the head and neck was performed using the standard protocol during bolus administration of intravenous contrast. Multiplanar CT image reconstructions and MIPs were obtained to evaluate the vascular anatomy. Carotid stenosis measurements (when applicable) are obtained utilizing NASCET criteria, using the distal internal carotid diameter as the denominator. Multiphase CT imaging of the brain was performed following IV bolus contrast injection. Subsequent parametric perfusion maps were calculated using RAPID software. CONTRAST:  90 cc Isovue 370 COMPARISON:  03/09/2018, 03/04/2018, 02/16/2018 CT head. 02/16/2018 MRI head. FINDINGS: CTA NECK FINDINGS Aortic arch: Standard branching. Imaged portion shows no evidence of  aneurysm or dissection. No significant stenosis of the major arch vessel origins. Mild calcific aortic atherosclerosis. Right carotid system: No evidence of dissection, stenosis (50% or greater) or occlusion. Mild non stenotic calcific atherosclerosis of the carotid bifurcation. Left carotid system: No evidence of dissection, stenosis (50% or greater) or occlusion. Minimal non stenotic calcific atherosclerosis of the carotid bifurcation. Vertebral arteries: Left  dominant, diminutive right vertebral artery. No evidence of dissection, stenosis (50% or greater) or occlusion. Skeleton: Moderate spondylosis of the cervical spine with multilevel disc and facet degenerative changes. No high-grade bony canal stenosis. Other neck: Negative. Upper chest: Negative. Review of the MIP images confirms the above findings CTA HEAD FINDINGS Anterior circulation: No significant stenosis, proximal occlusion, aneurysm, or vascular malformation. Mild non stenotic calcific atherosclerosis of the carotid siphons. Posterior circulation: No significant stenosis, proximal occlusion, aneurysm, or vascular malformation. Venous sinuses: As permitted by contrast timing, patent. Anatomic variants: Right vertebral artery largely terminates in the right PICA with diminutive contribution to the basilar. Complete circle-of-Willis. Large left A1, large anterior communicating artery, diminutive right A1, normal variant Review of the MIP images confirms the above findings CT Brain Perfusion Findings: CBF (<30%) Volume: 25mL Perfusion (Tmax>6.0s) volume: 72mL Mismatch Volume: 3mL Infarction Location:Left parietal lobe subacute infarction visible on noncontrast CT, pseudonormalized perfusion. IMPRESSION: 1. Patent carotid and vertebral arteries. No dissection, aneurysm, or hemodynamically significant stenosis utilizing NASCET criteria. 2. Patent anterior and posterior intracranial circulation. No large vessel occlusion, aneurysm, or significant stenosis. 3. Left parietal lobe subacute infarction visible on noncontrast CT with pseudonormalized perfusion. 4. No perfusion anomaly to suggest interval acute infarct. Electronically Signed   By: Kristine Garbe M.D.   On: 03/09/2018 18:44   Ct Chest Wo Contrast  Result Date: 03/12/2018 CLINICAL DATA:  Assess empyema. EXAM: CT CHEST WITHOUT CONTRAST TECHNIQUE: Multidetector CT imaging of the chest was performed following the standard protocol without IV  contrast. COMPARISON:  Chest radiograph performed 03/08/2018, and CT of the chest performed 03/04/2018 FINDINGS: Cardiovascular: Scattered coronary artery calcifications are seen. The heart is normal in size. The patient's left subclavian line is noted at the distal SVC. Mediastinum/Nodes: No pericardial effusion is identified. The thyroid gland is unremarkable in appearance. No mediastinal lymphadenopathy is seen. No axillary lymphadenopathy is appreciated. Lungs/Pleura: There has been placement of a left apical chest tube, with decrease in the size of the left-sided pleural effusion. Residual small bilateral pleural effusions are noted, somewhat loculated on the left. A new focus of increased attenuation is noted within the left-sided pleural effusion, which may reflect trace hemorrhage. No definite parenchymal necrosis is seen. There is collapse of much of the left lower lung lobe. Mild right-sided atelectasis is noted. The expanded portions of both lungs are grossly clear. No definite pneumothorax is seen. Slight irregularity in the contour of the proximal trachea may reflect sequelae of prior instrumentation. Upper Abdomen: A small amount of ascites is noted tracking about the liver. The visualized portions of the liver and spleen are otherwise unremarkable. Musculoskeletal: No acute osseous abnormalities are identified. The visualized musculature is unremarkable in appearance. IMPRESSION: 1. Placement of left apical chest tube, with decrease in the size of the left-sided pleural effusion. Residual small bilateral pleural effusions, somewhat loculated on the left. Empyema cannot be excluded, given clinical concern. 2. New focus of increased attenuation within the left-sided pleural effusion may reflect trace hemorrhage. No definite evidence of parenchymal necrosis. 3. Collapse of much of the left lower lung lobe. Mild right-sided atelectasis. 4. Scattered coronary  artery calcifications seen. 5. Small amount of  ascites noted tracking about the liver. Electronically Signed   By: Garald Balding M.D.   On: 03/12/2018 23:00   Ct Chest Wo Contrast  Result Date: 03/04/2018 CLINICAL DATA:  Shortness of breath, pneumonia EXAM: CT CHEST WITHOUT CONTRAST TECHNIQUE: Multidetector CT imaging of the chest was performed following the standard protocol without IV contrast. COMPARISON:  Chest x-ray 03/04/2018 FINDINGS: Cardiovascular: Heart is normal size. Aorta is normal caliber with scattered aortic calcifications. Moderate coronary artery calcifications in the left anterior descending coronary artery. Mediastinum/Nodes: Reactive borderline sized mediastinal lymph nodes. Lungs/Pleura: Complex large left pleural effusions with loculation and pleural gas. Cannot exclude bronchopleural fistula or empyema. Collapse of much of the left lung with only a small amount of aerated left upper lobe. Nodular airspace opacities in the right upper lobe and superior segment of the right lower lobe with trace right pleural effusion. Upper Abdomen: Imaging into the upper abdomen shows no acute findings. Musculoskeletal: Chest wall soft tissues are unremarkable. Degenerative changes in the thoracic spine. No acute bony abnormality. IMPRESSION: Large complex left pleural effusion with pleural gas and extensive loculations. This could reflect bronchopleural fistula or empyema. Collapse of much of the left lung with only a small amount of aerated left upper lobe. Nodular ground-glass airspace opacities centrally in the right upper lobe and in the superior segment of the right lower lobe, likely infectious/inflammatory. Coronary artery disease, aortic atherosclerosis. Electronically Signed   By: Rolm Baptise M.D.   On: 03/04/2018 11:39   Mr Brain Wo Contrast  Result Date: 03/10/2018 CLINICAL DATA:  Stroke follow-up EXAM: MRI HEAD WITHOUT CONTRAST TECHNIQUE: Multiplanar, multiecho pulse sequences of the brain and surrounding structures were obtained  without intravenous contrast. COMPARISON:  Brain MRI 02/16/2018 FINDINGS: BRAIN: There are multiple foci of abnormal diffusion restriction, the largest of which is located in the left parietal lobe. Other foci are located in the posterior left frontal lobe, right parietal lobe and the pons. There is no midline shift or mass effect. Mild cytotoxic edema of the left parietal lobe. Minimal white matter hyperintensity for age. Brain parenchyma is otherwise normal. The cerebral and cerebellar volume are age-appropriate. Susceptibility-sensitive sequences show no chronic microhemorrhage or superficial siderosis. VASCULAR: Major intracranial arterial and venous sinus flow voids are normal. SKULL AND UPPER CERVICAL SPINE: Calvarial bone marrow signal is normal. There is no skull base mass. Visualized upper cervical spine and soft tissues are normal. SINUSES/ORBITS: No fluid levels or advanced mucosal thickening. No mastoid or middle ear effusion. The orbits are normal. IMPRESSION: 1. Multifocal acute ischemia, spread throughout multiple bilateral vascular territories. 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 2. The largest infarction is in the left parietal lobe. Other sites include the posterior left frontal lobe, right parietal lobe and pons. 3. No hemorrhage or mass effect. Electronically Signed   By: Ulyses Jarred M.D.   On: 03/10/2018 06:17   Mr Brain Wo Contrast  Result Date: 02/16/2018 CLINICAL DATA:  Weight loss and weakness. EXAM: MRI HEAD WITHOUT CONTRAST TECHNIQUE: Multiplanar, multiecho pulse sequences of the brain and surrounding structures were obtained without intravenous contrast. COMPARISON:  CT same day FINDINGS: Brain: Generalized atrophy. Diffusion imaging does not show any acute or subacute infarction. The brainstem and cerebellum are normal. Mild chronic small-vessel ischemic change of the cerebral hemispheric deep white matter. No cortical or large vessel territory infarction. No mass  lesion, hemorrhage, hydrocephalus or extra-axial collection. Vascular: Major vessels at the base of  the brain show flow. Skull and upper cervical spine: Negative Sinuses/Orbits: Clear/normal Other: None IMPRESSION: No acute or reversible finding. Generalized atrophy. Mild chronic small-vessel ischemic change of the cerebral hemispheric white matter. Electronically Signed   By: Nelson Chimes M.D.   On: 02/16/2018 21:52   Nm Hepato W/eject Fract  Result Date: 02/18/2018 CLINICAL DATA:  Weight loss. EXAM: NUCLEAR MEDICINE HEPATOBILIARY IMAGING WITH GALLBLADDER EF TECHNIQUE: Sequential images of the abdomen were obtained out to 60 minutes following intravenous administration of radiopharmaceutical. After oral ingestion of Ensure, gallbladder ejection fraction was determined. At 60 min, normal ejection fraction is greater than 33%. RADIOPHARMACEUTICALS:  5.3 mCi Tc-70m  Choletec IV COMPARISON:  None. FINDINGS: Prompt uptake and biliary excretion of activity by the liver is seen. Gallbladder activity is visualized, consistent with patency of cystic duct. Biliary activity passes into small bowel, consistent with patent common bile duct. The gallbladder emptied spontaneously before the Ensure. As a result, an ejection fraction cannot be accurately calculated. That being said, the gallbladder is not visualized after ensure and gallbladder emptying is near-complete. IMPRESSION: The gallbladder emptied spontaneously before consumption of Ensure. While an ejection fraction cannot be calculated, gallbladder emptying is near complete. Electronically Signed   By: Dorise Bullion III M.D   On: 02/18/2018 13:40   Ct Cerebral Perfusion W Contrast  Result Date: 03/09/2018 CLINICAL DATA:  68 y/o F; patient extubated yesterday. Right-sided weakness and slow speech. Increased lethargy. EXAM: CT ANGIOGRAPHY HEAD AND NECK CT PERFUSION BRAIN TECHNIQUE: Multidetector CT imaging of the head and neck was performed using the standard  protocol during bolus administration of intravenous contrast. Multiplanar CT image reconstructions and MIPs were obtained to evaluate the vascular anatomy. Carotid stenosis measurements (when applicable) are obtained utilizing NASCET criteria, using the distal internal carotid diameter as the denominator. Multiphase CT imaging of the brain was performed following IV bolus contrast injection. Subsequent parametric perfusion maps were calculated using RAPID software. CONTRAST:  90 cc Isovue 370 COMPARISON:  03/09/2018, 03/04/2018, 02/16/2018 CT head. 02/16/2018 MRI head. FINDINGS: CTA NECK FINDINGS Aortic arch: Standard branching. Imaged portion shows no evidence of aneurysm or dissection. No significant stenosis of the major arch vessel origins. Mild calcific aortic atherosclerosis. Right carotid system: No evidence of dissection, stenosis (50% or greater) or occlusion. Mild non stenotic calcific atherosclerosis of the carotid bifurcation. Left carotid system: No evidence of dissection, stenosis (50% or greater) or occlusion. Minimal non stenotic calcific atherosclerosis of the carotid bifurcation. Vertebral arteries: Left dominant, diminutive right vertebral artery. No evidence of dissection, stenosis (50% or greater) or occlusion. Skeleton: Moderate spondylosis of the cervical spine with multilevel disc and facet degenerative changes. No high-grade bony canal stenosis. Other neck: Negative. Upper chest: Negative. Review of the MIP images confirms the above findings CTA HEAD FINDINGS Anterior circulation: No significant stenosis, proximal occlusion, aneurysm, or vascular malformation. Mild non stenotic calcific atherosclerosis of the carotid siphons. Posterior circulation: No significant stenosis, proximal occlusion, aneurysm, or vascular malformation. Venous sinuses: As permitted by contrast timing, patent. Anatomic variants: Right vertebral artery largely terminates in the right PICA with diminutive contribution  to the basilar. Complete circle-of-Willis. Large left A1, large anterior communicating artery, diminutive right A1, normal variant Review of the MIP images confirms the above findings CT Brain Perfusion Findings: CBF (<30%) Volume: 64mL Perfusion (Tmax>6.0s) volume: 57mL Mismatch Volume: 13mL Infarction Location:Left parietal lobe subacute infarction visible on noncontrast CT, pseudonormalized perfusion. IMPRESSION: 1. Patent carotid and vertebral arteries. No dissection, aneurysm, or hemodynamically significant stenosis utilizing  NASCET criteria. 2. Patent anterior and posterior intracranial circulation. No large vessel occlusion, aneurysm, or significant stenosis. 3. Left parietal lobe subacute infarction visible on noncontrast CT with pseudonormalized perfusion. 4. No perfusion anomaly to suggest interval acute infarct. Electronically Signed   By: Kristine Garbe M.D.   On: 03/09/2018 18:44   Dg Chest Port 1 View  Result Date: 03/14/2018 CLINICAL DATA:  Followup for pneumothorax. EXAM: PORTABLE CHEST 1 VIEW COMPARISON:  03/13/2018 and older studies. FINDINGS: No pneumothorax. There is left greater than right lung base opacity consistent with a combination of atelectasis and pleural fluid, without significant change from the previous day's study. No new lung abnormalities. Left subclavian central venous line has been removed. IMPRESSION: 1. No pneumothorax. 2. Persistent, left greater than right lung base opacities consistent with combination of pleural fluid and atelectasis. 3. No new abnormalities.  No change from the most recent prior exam. Electronically Signed   By: Lajean Manes M.D.   On: 03/14/2018 08:05   Dg Chest Port 1 View  Result Date: 03/13/2018 CLINICAL DATA:  Status post chest tube removal. EXAM: PORTABLE CHEST 1 VIEW COMPARISON:  Chest CT, 03/12/2018.  Chest radiographs, 03/08/2018. FINDINGS: Status post left chest tube removal.  No pneumothorax. Persistent lung base opacities, left  greater than right, consistent with a combination of pleural fluid and atelectasis. No new lung abnormalities. Left subclavian central venous line is stable and well positioned. IMPRESSION: 1. No pneumothorax following left chest tube removal. 2. Stable, left greater than right, lung base opacity consistent with pleural effusions with atelectasis. Electronically Signed   By: Lajean Manes M.D.   On: 03/13/2018 16:24   Dg Chest Port 1 View  Result Date: 03/08/2018 CLINICAL DATA:  67 year old female with empyema. Subsequent encounter. EXAM: PORTABLE CHEST 1 VIEW COMPARISON:  03/06/2018 chest x-ray and 03/04/2018 chest CT. FINDINGS: Left-sided chest tube is in place. Left chest wall subcutaneous emphysema once again noted. Questionable tiny left apical pneumothorax versus overlapping structures. Persistent consolidation left lung base. This may represent residua of empyema and underlying atelectasis or infiltrate. The gas containing component of empyema noted on recent chest x-ray and prior CT are not well delineated on the present exam. Endotracheal tube tip 3.4 cm above the carina. Left central line tip distal superior vena cava level. Nasogastric tube courses below the diaphragm. Tip is not included on the present exam. Heart size top-normal slightly enlarged.  Calcified aorta. IMPRESSION: 1. Left-sided chest tube is in place. Left chest wall subcutaneous emphysema once again noted. 2. Questionable tiny left apical pneumothorax versus overlapping structures. 3. Persistent consolidation left lung base. This may represent residua of empyema and underlying atelectasis or infiltrate. The gas containing component of empyema noted on recent chest x-ray and prior CT are not well delineated on the present exam. Electronically Signed   By: Genia Del M.D.   On: 03/08/2018 07:18   Dg Chest Port 1 View  Result Date: 03/06/2018 CLINICAL DATA:  Empyema with [place] EXAM: PORTABLE CHEST 1 VIEW COMPARISON:  05/13/2009  FINDINGS: endotracheal tube, NG tube, and central venous line unchanged. LEFT chest tube in place with LEFT basilar atelectasis and consolidation. Shallow pneumothorax along the lateral chest wall adjacent to the along the tract of the chest tube. Small amount subpulmonic gas additionally. Extensive subcutaneous gas along the LEFT chest wall associated with chest tube. RIGHT lung is clear. IMPRESSION: 1. Small pneumothorax in the lateral LEFT hemithorax adjacent to the chest tube. 2. LEFT basilar atelectasis and consolidation.  3. Stable support apparatus. Electronically Signed   By: Suzy Bouchard M.D.   On: 03/06/2018 08:23   Dg Chest Port 1 View  Result Date: 03/05/2018 CLINICAL DATA:  ET tube and chest tube present, respiratory failure. EXAM: PORTABLE CHEST 1 VIEW COMPARISON:  03/04/2018. FINDINGS: Support tubes and lines remain stable. LEFT chest tube redemonstrated, with slight improved basilar, apical, and lateral pneumothorax. Overall pneumothorax estimated 10% or prep slightly greater. RIGHT lung clear. RIGHT base volume loss with effusion. Cardiomediastinal silhouette unchanged. Subcutaneous air is increased. IMPRESSION: Slight decrease LEFT pneumothorax, with significant residual components throughout the hemithorax. Increased subcutaneous air. Electronically Signed   By: Staci Righter M.D.   On: 03/05/2018 09:10   Dg Chest Port 1 View  Result Date: 03/04/2018 CLINICAL DATA:  Endotracheal tube, nasogastric tube and chest tube placement. EXAM: PORTABLE CHEST 1 VIEW COMPARISON:  Chest x-ray from earlier same day. FINDINGS: LEFT-sided chest tube in place. Improved aeration of the LEFT hemithorax. Persistent pneumothorax at the LEFT lung base, moderate in size, with associated overlying airspace collapse. Small component of the pneumothorax seen at the LEFT lung apex. Dense opacity overlying the LEFT heart border is also compatible with atelectasis. RIGHT lung is clear. Heart size and mediastinal  contours are within normal limits. Endotracheal tube appears well positioned with tip approximately 3 cm above the carina. Enteric tube passes below the diaphragm. LEFT subclavian central line appears well positioned with tip at the level of the lower SVC/cavoatrial junction. IMPRESSION: 1. Support apparatus appears appropriately positioned, as detailed above. 2. Significantly improved aeration of the LEFT hemithorax status post LEFT-sided chest tube placement. Persistent pneumothorax on the LEFT, moderate in size, main component at the LEFT lung base with associated perihilar airspace collapse. Electronically Signed   By: Franki Cabot M.D.   On: 03/04/2018 17:26   Dg Chest Port 1 View  Result Date: 03/04/2018 CLINICAL DATA:  67 year old female with altered mental status, not responding. EXAM: PORTABLE CHEST 1 VIEW COMPARISON:  Portable chest 02/22/2018. FINDINGS: Portable AP upright view at 0854 hours. Near complete opacification of the left hemithorax where as only mild left lung base opacity was present 10 days ago. No shift of the mediastinum. The right lung remains clear allowing for portable technique. Visualized tracheal air column is within normal limits. No pneumothorax. Paucity bowel gas in the upper abdomen. No acute osseous abnormality identified. IMPRESSION: New subtotal opacification of the left hemithorax is nonspecific but suspicious for combination of left lung consolidation and pleural effusion. The right lung remains clear. Electronically Signed   By: Genevie Ann M.D.   On: 03/04/2018 09:41   Dg Chest Port 1 View  Result Date: 02/22/2018 CLINICAL DATA:  Leukocytosis. EXAM: PORTABLE CHEST 1 VIEW COMPARISON:  None. FINDINGS: Subtle asymmetric prominence of the LEFT perihilar shadow, suspicious for perihilar pneumonia and/or lymphadenopathy. Subtle opacity at the LEFT lung base, compatible with pneumonia or mild atelectasis. RIGHT lung is clear. Osseous structures about the chest are  unremarkable. IMPRESSION: 1. Suspected LEFT perihilar pneumonia and/or lymphadenopathy. Consider chest CT for confirmation. 2. Subtle opacity at the LEFT lung base which could represent pneumonia, atelectasis and/or small pleural effusion. Electronically Signed   By: Franki Cabot M.D.   On: 02/22/2018 16:10   Dg Swallowing Func-speech Pathology  Result Date: 03/12/2018 Objective Swallowing Evaluation: Type of Study: MBS-Modified Barium Swallow Study  Patient Details Name: Staceyann E Avila MRN: [id number] Date of Birth: 1950-05-07 Today's Date: 03/12/2018 Time: SLP Start Time (ACUTE ONLY): 1629 -SLP  Stop Time (ACUTE ONLY): 1655 SLP Time Calculation (min) (ACUTE ONLY): 26 min Past Medical History: Past Medical History: Diagnosis Date . Colitis  . Diabetes mellitus without complication ([place])  . Hypertension  . Malnutrition ([place])  Past Surgical History: Past Surgical History: Procedure Laterality Date . BIOPSY  02/26/2018  Procedure: BIOPSY;  Surgeon: Wilford Corner, MD;  Location: [place] [facility];  Service: Endoscopy;; . COLONOSCOPY WITH PROPOFOL N/A 02/26/2018  Procedure: COLONOSCOPY WITH PROPOFOL;  Surgeon: Wilford Corner, MD;  Location: [place] [facility];  Service: Endoscopy;  Laterality: N/A; . ESOPHAGOGASTRODUODENOSCOPY (EGD) WITH PROPOFOL N/A 02/26/2018  Procedure: ESOPHAGOGASTRODUODENOSCOPY (EGD) WITH PROPOFOL;  Surgeon: Wilford Corner, MD;  Location: [facility];  Service: Endoscopy;  Laterality: N/A; . NO PAST SURGERIES    UNCERTAIN OF NAME OF SURGERY HPI: Pt is a 67 year old admitted with respiratory failure, left lung empyema s/p CT placement 10/31, and septic shock. ETT 10/31-11/4. MRI 11/6 showed multifocal, bilateral acute ischemia with largest infarction in the L parietal lobe. Additional areas include the L frontal lobe, R parietal lobe, and pons. PMH: malnutrition and unexplained FTT (s/p recent GI and psych consults), HTN, DM  Subjective: pt alert but confused Assessment / Plan / Recommendation [facility]  IP CLINICAL IMPRESSIONS 03/12/2018 Clinical Impression Pt has a mild-moderate oral dysphagia that is felt to be at least in part due to cognitive status. She has reduced bolus cohesion, slow posterior transit, premature spillage particularly wtih thin liquids, and lingual residue. Occasional oral holding was noted, requiring only Min cues from SLP. With extra time and use of additional swallows she is able to sufficiently clear her oral cavity, but it is cumbersome. Her pharyngeal phase is timely and strong with good airway protection and clearance. Recommend starting Dys 1 diet and thin liquids with full supervision. SLP to f/u clinically for readiness to advance solids. Will also f/u for completion of speech/language evaluation. SLP Visit Diagnosis Dysphagia, oral phase (R13.11) Attention and concentration deficit following -- Frontal lobe and executive function deficit following -- Impact on safety and function Mild aspiration risk   [facility] IP TREATMENT RECOMMENDATION 03/12/2018 Treatment Recommendations Therapy as outlined in treatment plan below   Prognosis 03/12/2018 Prognosis for Safe Diet Advancement Good Barriers to Reach Goals Cognitive deficits Barriers/Prognosis Comment -- [facility] IP DIET RECOMMENDATION 03/12/2018 SLP Diet Recommendations Dysphagia 1 (Puree) solids;Thin liquid Liquid Administration via Straw;Cup Medication Administration Crushed with puree Compensations Slow rate;Small sips/bites Postural Changes Seated upright at 90 degrees   CH[facility] IP OTHER RECOMMENDATIONS 03/12/2018 Recommended Consults -- Oral Care Recommendations Oral care BID Other Recommendations Have oral suction available   [facility] IP FOLLOW UP RECOMMENDATIONS 03/12/2018 Follow up Recommendations Skilled Nursing facility   [facility] IP FREQUENCY AND DURATION 03/12/2018 Speech Therapy Frequency (ACUTE ONLY) min 2x/week Treatment Duration 2 weeks      [facility] IP ORAL PHASE 03/12/2018 Oral Phase Impaired Oral - Pudding Teaspoon -- Oral - Pudding Cup -- Oral -  Honey Teaspoon -- Oral - Honey Cup -- Oral - Nectar Teaspoon -- Oral - Nectar Cup -- Oral - Nectar Straw -- Oral - Thin Teaspoon -- Oral - Thin Cup Holding of bolus;Lingual/palatal residue;Delayed oral transit;Decreased bolus cohesion;Premature spillage Oral - Thin Straw Holding of bolus;Lingual/palatal residue;Delayed oral transit;Decreased bolus cohesion;Premature spillage Oral - Puree Holding of bolus;Lingual/palatal residue;Delayed oral transit;Decreased bolus cohesion;Premature spillage Oral - Mech Soft -- Oral - Regular -- Oral - Multi-Consistency -- Oral - Pill -- Oral Phase - Comment --  [facility] IP PHARYNGEAL PHASE 03/12/2018 Pharyngeal Phase WFL Pharyngeal- Pudding Teaspoon --  Pharyngeal -- Pharyngeal- Pudding Cup -- Pharyngeal -- Pharyngeal- Honey Teaspoon -- Pharyngeal -- Pharyngeal- Honey Cup -- Pharyngeal -- Pharyngeal- Nectar Teaspoon -- Pharyngeal -- Pharyngeal- Nectar Cup -- Pharyngeal -- Pharyngeal- Nectar Straw -- Pharyngeal -- Pharyngeal- Thin Teaspoon -- Pharyngeal -- Pharyngeal- Thin Cup -- Pharyngeal -- Pharyngeal- Thin Straw -- Pharyngeal -- Pharyngeal- Puree -- Pharyngeal -- Pharyngeal- Mechanical Soft -- Pharyngeal -- Pharyngeal- Regular -- Pharyngeal -- Pharyngeal- Multi-consistency -- Pharyngeal -- Pharyngeal- Pill -- Pharyngeal -- Pharyngeal Comment --  [facility] IP CERVICAL ESOPHAGEAL PHASE 03/12/2018 Cervical Esophageal Phase WFL Pudding Teaspoon -- Pudding Cup -- Honey Teaspoon -- Honey Cup -- Nectar Teaspoon -- Nectar Cup -- Nectar Straw -- Thin Teaspoon -- Thin Cup -- Thin Straw -- Puree -- Mechanical Soft -- Regular -- Multi-consistency -- Pill -- Cervical Esophageal Comment -- Germain Osgood 03/12/2018, 5:23 PM  Germain Osgood, M.A. CCC-SLP Acute Rehabilitation Services Pager [phone] Office [phone]             Vas [place] Transcranial Doppler W Bubbles  Result Date: 03/11/2018  Transcranial Doppler with Bubble Indications: Stroke. Performing Technologist: Landry Mellow RDMS, RVT   Examination Guidelines: A complete evaluation includes B-mode imaging, spectral Doppler, color Doppler, and power Doppler as needed of all accessible portions of each vessel. Bilateral testing is considered an integral part of a complete examination. Limited examinations for reoccurring indications may be performed as noted.  Summary:  A vascular evaluation was performed. The right middle cerebral artery was studied. An IV was inserted into the patient's right upper arm PICC line. Verbal informed consent was obtained.  Limited study due to poor patient cooperation, unable to perform Valsalva. No HITS heard at rest. No evidence of PFO with this test. Negative TCD Bubble study *See table(s) above for measurements and observations.  Diagnosing physician: Antony Contras MD Electronically signed by Antony Contras MD on 03/11/2018 at 3:51:31 PM.    Final    Vas [place] Lower Extremity Venous (dvt)  Result Date: 03/10/2018  Lower Venous Study Indications: Stroke, and embolic stroke.  Limitations: Body habitus, bandages, line and adjecent arterial cacification shadowing,severe edema and immobility. Performing Technologist: Lorina Rabon  Examination Guidelines: A complete evaluation includes B-mode imaging, spectral Doppler, color Doppler, and power Doppler as needed of all accessible portions of each vessel. Bilateral testing is considered an integral part of a complete examination. Limited examinations for reoccurring indications may be performed as noted.  Right Venous Findings: +---------+---------------+---------+-----------+----------+-------------------+          CompressibilityPhasicitySpontaneityPropertiesSummary             +---------+---------------+---------+-----------+----------+-------------------+ CFV      Full           Yes      Yes                                      +---------+---------------+---------+-----------+----------+-------------------+ SFJ      Full                                                              +---------+---------------+---------+-----------+----------+-------------------+ FV Prox  Full                                                             +---------+---------------+---------+-----------+----------+-------------------+  FV Mid   Full                                                             +---------+---------------+---------+-----------+----------+-------------------+ FV DistalFull                                                             +---------+---------------+---------+-----------+----------+-------------------+ PFV      Full                                                             +---------+---------------+---------+-----------+----------+-------------------+ POP      Full           Yes      Yes                                      +---------+---------------+---------+-----------+----------+-------------------+ PTV      None                    No                   Acute               +---------+---------------+---------+-----------+----------+-------------------+ PERO                                                  not well visualized +---------+---------------+---------+-----------+----------+-------------------+  Left Venous Findings: +---------+---------------+---------+-----------+----------+-------------------+          CompressibilityPhasicitySpontaneityPropertiesSummary             +---------+---------------+---------+-----------+----------+-------------------+ CFV      Full           Yes      Yes                                      +---------+---------------+---------+-----------+----------+-------------------+ SFJ      Full                                                             +---------+---------------+---------+-----------+----------+-------------------+ FV Prox  Full                                                              +---------+---------------+---------+-----------+----------+-------------------+ FV Mid  Full                                                             +---------+---------------+---------+-----------+----------+-------------------+ FV DistalFull                                                             +---------+---------------+---------+-----------+----------+-------------------+ PFV      Full                                                             +---------+---------------+---------+-----------+----------+-------------------+ POP      Full           Yes      Yes                                      +---------+---------------+---------+-----------+----------+-------------------+ PTV                                                   not well visualized +---------+---------------+---------+-----------+----------+-------------------+ PERO     None                    No                   Acute               +---------+---------------+---------+-----------+----------+-------------------+    Summary: Right: Findings consistent with acute deep vein thrombosis involving the right posterior tibial vein. No cystic structure found in the popliteal fossa. Left: Findings consistent with acute deep vein thrombosis involving the left peroneal vein. No cystic structure found in the popliteal fossa.  *See table(s) above for measurements and observations. Electronically signed by Curt Jews MD on 03/10/2018 at 2:11:56 PM.    Final    [place] Ekg Site Rite  Result Date: 03/12/2018 If Site Rite image not attached, placement could not be confirmed due to current cardiac rhythm.  Dg Esophagus W/water Sol Cm  Result Date: 03/12/2018 CLINICAL DATA:  67 year old female with clinical concern for possible esophageal perforation. EXAM: ESOPHOGRAM/BARIUM SWALLOW TECHNIQUE: Single contrast examination was performed using thin barium or water soluble. FLUOROSCOPY TIME:  Fluoroscopy Time:  1  minutes and 24 seconds Radiation Exposure Index (if provided by the fluoroscopic device): 16.5 mGy COMPARISON:  None. FINDINGS: Limited single contrast esophagram with water-soluble contrast material (Omnipaque 300) performed. Failure to fully propagate any normal primary peristaltic waves. Extensive tertiary contractions. No gross evidence of esophageal mass, stricture or esophageal ring. No hiatal hernia. No extravasation of contrast material from the lumen of the esophagus at any time during the examination. IMPRESSION: 1. No findings to suggest esophageal perforation. 2. Nonspecific esophageal motility disorder with severe tertiary  contractions. Electronically Signed   By: Vinnie Langton M.D.   On: 03/12/2018 10:55     Phillips Climes M.D on 03/15/2018 at 3:36 PM  Between 7am to 7pm - Pager - [phone]  After 7pm go to [email or web address] - password [facility]  [facility] -  Office  [phone]

## 2018-03-15 NOTE — Progress Notes (Addendum)
  Speech Language Pathology Treatment: Dysphagia  Patient Details Name: Maria Avila MRN: [id number] DOB: Jul 01, 1950 Today's Date: 03/15/2018 Time: 8413-2440 SLP Time Calculation (min) (ACUTE ONLY): 25 min  Assessment / Plan / Recommendation Clinical Impression  Pt participated in trials of advanced texture diet with SLP.  Pt was able to self feed at slow rate.  Pt independently took small bites and used liquid wash following solids. Pt exhibited adequate oral clearance of mechanical soft and regular solids.  There was no residue with mechanical soft texture, and trace, diffuse oral residue with regular texture.  Pt was not wearing dentures on SLP arrival.  Initial trials without dentures required extremely prolonged oral phase, but pt still exhibited adequate oral clearance.  With dentures in place, oral phase of swallow was more efficient, but pt still requires additional time. Recommend advancing to mechanical soft diet with thin liquid with swallow precautions as listed below.  Please place dentures prior to PO intake. If pt exhibits difficulty with mechanical soft diet or if PO intake decreases, please downgrade to puree and notify SLP.  Continue medications crushed in puree.  Son reports pt has tendency to hold pills in mouth and then spit them out.   HPI HPI: Pt is a 67 year old admitted with respiratory failure, left lung empyema s/p CT placement 10/31, and septic shock. ETT 10/31-11/4. MRI 11/6 showed multifocal, bilateral acute ischemia with largest infarction in the L parietal lobe. Additional areas include the L frontal lobe, R parietal lobe, and pons. PMH: malnutrition and unexplained FTT (s/p recent GI and psych consults), HTN, DM.  MBSS 11/8 revealed primarily oral dysphagia with adequte airway protection although premature spillage was noted.      SLP Plan  Continue with current plan of care       Recommendations  Diet recommendations: Dysphagia 3 (mechanical soft);Thin  liquid  Liquids provided via: Cup;Straw  Medication Administration: Crushed with puree  Supervision: Intermittent supervision to cue for compensatory strategies  Compensations:   Minimize environmental distractions;  Slow rate;  Small sips/bites;  Alternate solids/liquids;  Dentures in place for PO intake  Postural Changes and/or Swallow Maneuvers: Seated upright 90 degrees                Oral Care Recommendations: Oral care BID Follow up Recommendations: (Continue ST at next level of care) SLP Visit Diagnosis: Dysphagia, oropharyngeal phase (R13.12) Plan: Continue with current plan of care       [place], [place], [place] Office: [phone]; Pager (11/11): [phone] 03/15/2018, 11:44 AM

## 2018-03-15 NOTE — Consult Note (Addendum)
Physical Medicine and Rehabilitation Consult   Reason for Consult:  Functional deficits.  Referring Physician:  Dr. Waldron Labs.    HPI: Maria Avila is a 67 y.o. female with history fo T2DM, HTN, colitis with 6-12 months hx of N/V, left sided abdominal pain with progressive anorexia who was admitted on 02/16/18, underwent extensive negative workup and discharged to SNF for rehab. History taken from chart review. She was readmitted on 03/04/18 with mental status changes and hypoxemia with tachycardia. She was found to be septic with near complete opacification of lef chest due to empyema. She was intubated, left chest tube placed with purulent drainage and was started on IV antibiotics.  Dr. Roxan Hockey was consulted for input and recommended gastrograffin swallow to rule of esophageal perforation and felt that empyema likely due to aspiration PNA.  follow up CXR showed incomplete reexpansion and treated with tPA via chest tube for thrombolysis. She was started on TPN for nutritional support. Pleural fluid positive for E coli, enterococcus and pseudomonas aeruginosa and antibiotics narrowed to unasyn.  She was extubated on 11/5 and found to be lethargic with right sided weakness with left gaze preference and mumbling speech. Neurology consulted for input and CT perfusion done revealing left parietal lobe subacute infarct with pseudonormalized perfusion. CTA head/neck was negative for large vessel occlusion.   BLE dopplers were positive for acute DVT in right posterior tibial veins and left peroneal veins. she was started on IV heparin for treatment. TCD negative for HITs and no evidence of PFO. Dr. Erlinda Hong felt that stroke was embolic due to unknown source--endocarditis v/s hypercoagulable state due to sepsis.  CT tube removed on 11/9 as nonfunctional with recommendations to follow clinically for recurrence or respiratory deterioration. Has been transitioned to Xarelto and on dysphagia 1 diet, thins.   Therapy evaluations done revealing strong posterior lean when at EOB with visiospatial deficits, global weakness with tachycardia with activity and moderate cognitive linguistic deficits. CIR recommended by rehab team.   Review of Systems  Constitutional: Negative for chills and fever.  HENT: Negative for hearing loss and tinnitus.   Eyes: Negative for blurred vision and double vision.  Respiratory: Positive for shortness of breath. Negative for cough.   Cardiovascular: Negative for chest pain and palpitations.  Gastrointestinal: Negative for heartburn and nausea.       Poor appetite  Genitourinary: Negative for dysuria.  Musculoskeletal: Negative for myalgias.  Neurological: Positive for focal weakness. Negative for dizziness and headaches.  Psychiatric/Behavioral: Positive for memory loss.  All other systems reviewed and are negative.   Past Medical History:  Diagnosis Date  . Colitis   . Diabetes mellitus without complication ([place])   . Hypertension   . Malnutrition ([place])     Past Surgical History:  Procedure Laterality Date  . BIOPSY  02/26/2018   Procedure: BIOPSY;  Surgeon: Wilford Corner, MD;  Location: [facility];  Service: Endoscopy;;  . COLONOSCOPY WITH PROPOFOL N/A 02/26/2018   Procedure: COLONOSCOPY WITH PROPOFOL;  Surgeon: Wilford Corner, MD;  Location: [facility];  Service: Endoscopy;  Laterality: N/A;  . ESOPHAGOGASTRODUODENOSCOPY (EGD) WITH PROPOFOL N/A 02/26/2018   Procedure: ESOPHAGOGASTRODUODENOSCOPY (EGD) WITH PROPOFOL;  Surgeon: Wilford Corner, MD;  Location: [facility];  Service: Endoscopy;  Laterality: N/A;  . NO PAST SURGERIES     UNCERTAIN OF NAME OF SURGERY    Family History  Problem Relation Age of Onset  . Alzheimer's disease Mother   . Colon cancer Father     Social  History:  Married.  reports that she has quit smoking. She has never used smokeless tobacco. She reports that she drank alcohol. She reports that she has current or past  drug history.    Allergies  Allergen Reactions  . Crestor [Rosuvastatin Calcium] Swelling and Other (See Comments)    Swelling of legs and muscle weakness  . Zocor [Simvastatin] Swelling and Other (See Comments)    Swelling of legs and muscle weakness    Medications Prior to Admission  Medication Sig Dispense Refill  . aspirin EC 81 MG tablet Take 81 mg by mouth daily.    . Cholecalciferol (VITAMIN D) 2000 units CAPS Take 2,000 Units by mouth daily.     . feeding supplement, ENSURE ENLIVE, (ENSURE ENLIVE) LIQD Take 237 mLs by mouth 3 (three) times daily between meals. 237 mL 12  . lipase/protease/amylase 24000-76000 units CPEP Take 1 capsule (24,000 Units total) by mouth 3 (three) times daily with meals. 270 capsule 1  . lisinopril (PRINIVIL,ZESTRIL) 10 MG tablet Take 10 mg by mouth daily.  3  . mirtazapine (REMERON) 7.5 MG tablet Take 1 tablet (7.5 mg total) by mouth at bedtime. 30 tablet 1  . Multiple Vitamin (MULTIVITAMIN) capsule Take 1 capsule by mouth daily.    . naproxen sodium (ALEVE) 220 MG tablet Take 220 mg by mouth 2 (two) times daily as needed.    . Omega-3 Fatty Acids (FISH OIL) 1000 MG CAPS Take 1,000 mg by mouth daily.     . ondansetron (ZOFRAN ODT) 4 MG disintegrating tablet Take 1 tablet (4 mg total) by mouth every 6 (six) hours as needed. (Patient taking differently: Take 4 mg by mouth every 6 (six) hours as needed for nausea or vomiting. ) 20 tablet 0  . pantoprazole (PROTONIX) 40 MG tablet Take 40 mg by mouth daily.    . pravastatin (PRAVACHOL) 40 MG tablet Take 40 mg by mouth every evening.  3  . promethazine (PHENERGAN) 12.5 MG tablet Take 12.5 mg by mouth every 6 (six) hours as needed for nausea.  1  . thiamine 100 MG tablet Take 1 tablet (100 mg total) by mouth daily. (Patient not taking: Reported on 03/04/2018) 30 tablet 0    Home: Home Living Family/patient expects to be discharged to:: Private residence Living Arrangements: Spouse/significant  other Available Help at Discharge: Family, Available 24 hours/day Type of Home: House Home Access: Stairs to enter Technical brewer of Steps: 3 Entrance Stairs-Rails: None Home Layout: One level Bathroom Shower/Tub: Multimedia programmer: Standard Home Equipment: None Additional Comments: per husband  Functional History: Prior Function Level of Independence: Needs assistance Gait / Transfers Assistance Needed: walked with husband's help household distances ADL's / Homemaking Assistance Needed: pt needing help with showering in the days leading up to this hospitalization, but typically independent Communication / Swallowing Assistance Needed: chart reports that family has noticed some recent slurring. Pt did not verbalize much during eval but was intelligible when she did Functional Status:  Mobility: Bed Mobility Overal bed mobility: Needs Assistance Bed Mobility: Supine to Sit, Sit to Supine Supine to sit: Max assist, +2 for physical assistance, +2 for safety/equipment Sit to supine: +2 for physical assistance, +2 for safety/equipment General bed mobility comments: MaxA+2 for supine to sit. strong posterior lean at EOB requiring fluctuating Mod-MaxA to maintain upright. Able to inconsistently correct posture. HR to 130BPM sitting EOB  Transfers General transfer comment: deferred due to strong posterior lean and fatigue, HR  Ambulation/Gait General Gait Details: deferred  due to strong posterior lean and fatigue, HR     ADL: ADL General ADL Comments: pt requires total care, NPO  Cognition: Cognition Overall Cognitive Status: Impaired/Different from baseline Arousal/Alertness: Awake/alert Orientation Level: Oriented to person, Disoriented to place, Disoriented to time, Disoriented to situation Attention: Sustained Sustained Attention: Impaired Sustained Attention Impairment: Verbal basic Memory: Impaired Memory Impairment: Storage deficit, Retrieval deficit,  Decreased recall of new information Awareness: Impaired Awareness Impairment: Intellectual impairment, Emergent impairment Problem Solving: Impaired Problem Solving Impairment: Verbal basic Safety/Judgment: Impaired Cognition Arousal/Alertness: Awake/alert Behavior During Therapy: Flat affect Overall Cognitive Status: Impaired/Different from baseline Area of Impairment: Orientation, Attention, Memory, Following commands, Safety/judgement, Awareness, Problem solving Orientation Level: Disoriented to, Place, Time, Situation Current Attention Level: Focused Memory: Decreased short-term memory, Decreased recall of precautions Following Commands: Follows one step commands inconsistently, Follows one step commands with increased time Safety/Judgement: Decreased awareness of safety, Decreased awareness of deficits Awareness: Intellectual Problem Solving: Slow processing, Difficulty sequencing, Requires verbal cues, Requires tactile cues General Comments: fatigues very easily   Blood pressure 120/89, pulse (!) 103, temperature 98.3 F (36.8 C), temperature source Oral, resp. rate 20, height 5\' 2"  (1.575 m), weight 65.1 kg, SpO2 100 %. Physical Exam  Nursing note and vitals reviewed. Constitutional: She appears well-developed and well-nourished.  HENT:  Head: Normocephalic and atraumatic.  Eyes: EOM are normal. Right eye exhibits no discharge. Left eye exhibits no discharge.  Neck: Normal range of motion. Neck supple.  Cardiovascular: Regular rhythm. Tachycardia present.  Respiratory: Effort normal and breath sounds normal.  GI: Soft. Bowel sounds are normal. She exhibits no distension. There is no tenderness.  Genitourinary:  Genitourinary Comments: Rectal tube and foley in place.   Musculoskeletal:  No edema or tenderness in extremities  Neurological: She is alert.  Oriented to self and place but not to situation--"stroke". Disoriented--unable to recall month or year without cues.    Lying in bed, slumped to the left.  Speech clear and able to follow simple one and two step motor commands.  Motor: LUE/LLE: 4+/5 proximal to distal RUE: 4-4+/5 proximal to distal RLE: 4/5 proximal to distal ?Right sided inattention  Skin: Skin is warm and dry.  Cream and Foam dressings noted on inner thighs.   Psychiatric: She has a normal mood and affect. Her behavior is normal.    Results for orders placed or performed during the hospital encounter of 03/04/18 (from the past 24 hour(s))  Glucose, capillary     Status: None   Collection Time: 03/14/18  9:14 AM  Result Value Ref Range   Glucose-Capillary 78 70 - 99 mg/dL  Glucose, capillary     Status: None   Collection Time: 03/14/18  4:32 PM  Result Value Ref Range   Glucose-Capillary 87 70 - 99 mg/dL  Glucose, capillary     Status: None   Collection Time: 03/14/18 11:48 PM  Result Value Ref Range   Glucose-Capillary 84 70 - 99 mg/dL  CBC     Status: Abnormal   Collection Time: 03/15/18  4:06 AM  Result Value Ref Range   WBC 14.1 (H) 4.0 - 10.5 K/uL   RBC 2.82 (L) 3.87 - 5.11 MIL/uL   Hemoglobin 8.5 (L) 12.0 - 15.0 g/dL   HCT 26.0 (L) 36.0 - 46.0 %   MCV 92.2 80.0 - 100.0 fL   MCH 30.1 26.0 - 34.0 pg   MCHC 32.7 30.0 - 36.0 g/dL   RDW 16.4 (H) 11.5 - 15.5 %   Platelets 276  150 - 400 K/uL   nRBC 0.0 0.0 - 0.2 %  Glucose, capillary     Status: Abnormal   Collection Time: 03/15/18  8:13 AM  Result Value Ref Range   Glucose-Capillary 58 (L) 70 - 99 mg/dL   Comment 1 Notify RN    Comment 2 Document in Chart    Dg Chest Port 1 View  Result Date: 03/14/2018 CLINICAL DATA:  Followup for pneumothorax. EXAM: PORTABLE CHEST 1 VIEW COMPARISON:  03/13/2018 and older studies. FINDINGS: No pneumothorax. There is left greater than right lung base opacity consistent with a combination of atelectasis and pleural fluid, without significant change from the previous day's study. No new lung abnormalities. Left subclavian central venous  line has been removed. IMPRESSION: 1. No pneumothorax. 2. Persistent, left greater than right lung base opacities consistent with combination of pleural fluid and atelectasis. 3. No new abnormalities.  No change from the most recent prior exam. Electronically Signed   By: Lajean Manes M.D.   On: 03/14/2018 08:05   Dg Chest Port 1 View  Result Date: 03/13/2018 CLINICAL DATA:  Status post chest tube removal. EXAM: PORTABLE CHEST 1 VIEW COMPARISON:  Chest CT, 03/12/2018.  Chest radiographs, 03/08/2018. FINDINGS: Status post left chest tube removal.  No pneumothorax. Persistent lung base opacities, left greater than right, consistent with a combination of pleural fluid and atelectasis. No new lung abnormalities. Left subclavian central venous line is stable and well positioned. IMPRESSION: 1. No pneumothorax following left chest tube removal. 2. Stable, left greater than right, lung base opacity consistent with pleural effusions with atelectasis. Electronically Signed   By: Lajean Manes M.D.   On: 03/13/2018 16:24    Assessment/Plan: Diagnosis: left parietal lobe with multi-medical issues Labs and images (see above) independently reviewed.  Records reviewed and summated above. Stroke: Continue secondary stroke prophylaxis and Risk Factor Modification listed below:   Antiplatelet therapy:   Blood Pressure Management:  Continue current medication with prn's with permisive HTN per primary team Statin Agent:   Diabetes management:   Right sided hemiparesis: fit for orthosis to prevent contractures (resting hand splint for day, wrist cock up splint at night, PRAFO, etc) Motor recovery: Fluoxetine  1. Does the need for close, 24 hr/day medical supervision in concert with the patient's rehab needs make it unreasonable for this patient to be served in a less intensive setting? Yes  2. Co-Morbidities requiring supervision/potential complications: dysphagia (advance diet as tolerated), acute DVT (cont  anticoagulation), T2 DM (Monitor in accordance with exercise and adjust meds as necessary), HTN (monitor and provide prns in accordance with increased physical exertion and pain), colitis, Tachycardia (monitor in accordance with pain and increasing activity), leukocytosis (repeat labs, cont to monitor for signs and symptoms of infection, further workup if indicated) 3. Due to bladder management, safety, skin/wound care, disease management, medication administration and patient education, does the patient require 24 hr/day rehab nursing? Yes 4. Does the patient require coordinated care of a physician, rehab nurse, PT (1-2 hrs/day, 5 days/week), OT (1-2 hrs/day, 5 days/week) and SLP (1-2 hrs/day, 5 days/week) to address physical and functional deficits in the context of the above medical diagnosis(es)? Yes Addressing deficits in the following areas: balance, endurance, locomotion, strength, transferring, bathing, dressing, toileting, cognition, language, swallowing and psychosocial support 5. Can the patient actively participate in an intensive therapy program of at least 3 hrs of therapy per day at least 5 days per week? Potentially 6. The potential for patient to make measurable gains  while on inpatient rehab is excellent 7. Anticipated functional outcomes upon discharge from inpatient rehab are min assist  with PT, min assist with OT, supervision with SLP. 8. Estimated rehab length of stay to reach the above functional goals is: 20-24 days. 9. Anticipated D/C setting: Home 10. Anticipated post D/C treatments: HH therapy and Home excercise program 11. Overall Rehab/Functional Prognosis: good  RECOMMENDATIONS: This patient's condition is appropriate for continued rehabilitative care in the following setting: CIR if adequate caregiver support available once able to tolerate 3 hours of therapy/day. Patient has agreed to participate in recommended program. Potentially Note that insurance prior  authorization may be required for reimbursement for recommended care.  Comment: Rehab Admissions Coordinator to follow up.   I have personally performed a face to face diagnostic evaluation, including, but not limited to relevant history and physical exam findings, of this patient and developed relevant assessment and plan.  Additionally, I have reviewed and concur with the physician assistant's documentation above.   Delice Lesch, MD, ABPMR Bary Leriche, PA-C 03/15/2018

## 2018-03-15 NOTE — PMR Pre-admission (Signed)
PMR Admission Coordinator Pre-Admission Assessment  Patient: Maria Avila is an 67 y.o., female MRN: [id number] DOB: 03-Dec-1950 Height: 5\' 2"  (157.5 cm) Weight: (scale not working)              Forensic scientist HMO: Yes    PPO:      PCP:      IPA:      80/20:      OTHER:  PRIMARY: U[employer] Medicare      Policy#: [id number]      Subscriber: Patient CM Name: [name]      Phone#: [phone]     Fax#: [phone] Pre-Cert#: [id number]      Employer:  [employer]auf[employer] provided by Abigail Butts at [facility] on 03/16/18; Okay to admit 03/17/18. Clinical updates due to [name] on day 7 from approval date (due 03/22/18).  Benefits:  Phone #: NA     Name: [email or web address] Eff. Date: 05/05/17     Deduct: $0      Out of Pocket Max: $4,400 ($1,211.29)      Life Max: NA CIR: $345/day for days 1-5; $0/day for days 6+      SNF: $0/day for days 1-20; $160/day for days 21-48, $0/day for days 49-100 (100 day limit) Outpatient: per necessity   Co-Pay: $40/visit Home Health: 100%, per necessity      Co-Pay:  DME: 80%     Co-Pay: 20% Providers:  SECONDARY:       Policy#:       Subscriber:  CM Name:       Phone#:      Fax#:  Pre-Cert#:       Employer:  Benefits:  Phone #:      Name:  Eff. Date:      Deduct:       Out of Pocket Max:       Life Max:  CIR:       SNF:  Outpatient:      Co-Pay:  Home Health:       Co-Pay:  DME:      Co-Pay:   Medicaid Application Date:       Case Manager:  Disability Application Date:       Case Worker:   Emergency Contact Information Contact Information    Name Relation Home Work Mobile   Grant R Spouse [phone]     Sonia Baller Daughter   [phone]   Odis Luster   [phone]     Current Medical History  Patient Admitting Diagnosis: left parietal lobe with multi-medical issues  History of Present Illness: Maria Avila is a 67 y.o. female with history fo T2DM, HTN, colitis with 6-12 months hx of N/V, left sided abdominal pain with progressive anorexia who was  admitted on 02/16/18, underwent extensive negative workup and discharged to SNF for rehab. History taken from chart review. She was readmitted on 03/04/18 with mental status changes and hypoxemia with tachycardia. She was found to be septic with near complete opacification of lef chest due to empyema. She was intubated, left chest tube placed with purulent drainage and was started on IV antibiotics.  Dr. Roxan Hockey was consulted for input and recommended gastrograffin swallow to rule of esophageal perforation and felt that empyema likely due to aspiration PNA.  follow up CXR showed incomplete reexpansion and treated with tPA via chest tube for thrombolysis. She was started on TPN for nutritional support. Pleural fluid positive for E coli, enterococcus and pseudomonas aeruginosa and antibiotics narrowed to unasyn.  She  was extubated on 11/5 and found to be lethargic with right sided weakness with left gaze preference and mumbling speech. Neurology consulted for input and CT perfusion done revealing left parietal lobe subacute infarct with pseudonormalized perfusion. CTA head/neck was negative for large vessel occlusion.   BLE dopplers were positive for acute DVT in right posterior tibial veins and left peroneal veins. she was started on IV heparin for treatment. TCD negative for HITs and no evidence of PFO. Dr. Erlinda Hong felt that stroke was embolic due to unknown source--endocarditis v/s hypercoagulable state due to sepsis.  CT tube removed on 11/9 as nonfunctional with recommendations to follow clinically for recurrence or respiratory deterioration. Has been transitioned to Xarelto and on dysphagia 1 diet, thins.  Therapy evaluations done revealing strong posterior lean when at EOB with visiospatial deficits, global weakness with tachycardia with activity and moderate cognitive linguistic deficits. CIR recommended by rehab team. Pt is to be admitted to CIR on 03/17/18.   Complete NIHSS TOTAL: 21    Past Medical  History  Past Medical History:  Diagnosis Date  . Colitis   . Diabetes mellitus without complication ([place])   . Hypertension   . Malnutrition ([place])     Family History  family history includes Alzheimer's disease in her mother; Colon cancer in her father.  Prior Rehab/Hospitalizations:  Has the patient had major surgery during 100 days prior to admission? Yes  Current Medications   Current Facility-Administered Medications:  .  0.9 %  sodium chloride infusion, 250 mL, Intravenous, Continuous, [name], MD, Stopped at 03/04/18 2139 .  0.9 %  sodium chloride infusion, , Intravenous, Continuous, [name], MD, Last Rate: 75 mL/hr at 03/16/18 1345 .  apixaban (ELIQUIS) tablet 10 mg, 10 mg, Oral, BID, 10 mg at 03/17/18 0944 **FOLLOWED BY** [START ON 03/20/2018] apixaban (ELIQUIS) tablet 5 mg, 5 mg, Oral, BID, [name], RPH .  bisacodyl (DULCOLAX) suppository 10 mg, 10 mg, Rectal, Daily PRN, [name], MD, 10 mg at 03/07/18 1100 .  chlorhexidine (PERIDEX) 0.12 % solution 15 mL, 15 mL, Mouth Rinse, BID, [name], MD, 15 mL at 03/17/18 0944 .  Chlorhexidine Gluconate Cloth 2 % PADS 6 each, 6 each, Topical, Daily, [name], MD, 6 each at 03/17/18 0944 .  collagenase (SANTYL) ointment, , Topical, Daily, Elgergawy, Silver Huguenin, MD .  feeding supplement (ENSURE ENLIVE) (ENSURE ENLIVE) liquid 237 mL, 237 mL, Oral, TID BM, Elgergawy, Silver Huguenin, MD, 237 mL at 03/16/18 1346 .  Gerhardt's butt cream, , Topical, Daily, Elgergawy, Silver Huguenin, MD .  levofloxacin (LEVAQUIN) tablet 750 mg, 750 mg, Oral, Daily, Elgergawy, Silver Huguenin, MD, 750 mg at 03/17/18 0944 .  MEDLINE mouth rinse, 15 mL, Mouth Rinse, q12n4p, [name], MD, 15 mL at 03/16/18 1154 .  pantoprazole (PROTONIX) injection 40 mg, 40 mg, Intravenous, Q24H, [name], MD, 40 mg at 03/16/18 1815 .  sodium chloride flush (NS) 0.9 % injection 10-40 mL, 10-40 mL, Intracatheter, Q12H, [name], MD, 10  mL at 03/17/18 0946 .  sodium chloride flush (NS) 0.9 % injection 10-40 mL, 10-40 mL, Intracatheter, PRN, [name], MD, 10 mL at 03/11/18 1710  Patients Current Diet:  Diet Order            DIET DYS 3 Room service appropriate? 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 None of the above  Prior Functional Level Prior Function Level of Independence: Needs assistance Gait / Transfers Assistance Needed: walked with husband's help household distances ADL's / Homemaking Assistance Needed: pt needing help with showering in the days leading up to this hospitalization, but typically independent Communication / Swallowing Assistance Needed: chart reports that family has noticed some recent slurring. Pt did not verbalize much during eval but was intelligible when she did  Self Care: Did the patient need help bathing, dressing, using the toilet or eating?  Independent  Indoor Mobility: Did the patient need assistance with walking from room to room (with or without device)? Independent  Stairs: Did the patient need assistance with internal or external stairs (with or without device)? Independent  Functional  Cognition: Did the patient need help planning regular tasks such as shopping or remembering to take medications? Independent  Current Functional Level Cognition  Arousal/Alertness: Awake/alert Overall Cognitive Status: Impaired/Different from baseline Current Attention Level: Sustained Orientation Level: Oriented to person, Oriented to place, Disoriented to time, Disoriented to situation Following Commands: Follows one step commands with increased time, Follows one step commands consistently Safety/Judgement: Decreased awareness of safety, Decreased awareness of deficits General Comments: fatigues very easily. Confusion noted during session when pt stated that the lotion bottle in her hand was burning. Pt requiring increased time and cues throughout session Attention: Sustained Sustained Attention: Impaired Sustained Attention Impairment: Verbal basic Memory: Impaired Memory Impairment: Storage deficit, Retrieval deficit, Decreased recall of new information Awareness: Impaired Awareness Impairment: Intellectual impairment, Emergent impairment Problem Solving: Impaired Problem Solving Impairment: Verbal basic Safety/Judgment: Impaired    Extremity Assessment (includes Sensation/Coordination)  Upper Extremity Assessment: Generalized weakness, RUE deficits/detail, LUE deficits/detail RUE Deficits / Details: pt reports her R UE feels "different than her L," strength 2/5 shoulder,3/5 elbow, 3-/5 gross graspt  RUE Coordination: decreased fine motor, decreased gross motor LUE Deficits / Details: 2/5 shoulder, 3/5 elbow to hand LUE Coordination: decreased fine motor, decreased gross motor  Lower Extremity Assessment: Defer to PT evaluation RLE Deficits / Details: unable to formally test MMT due to fatigue, based on functional assessment at best 3-/5 B quads  LLE Deficits / Details: unable to formally test MMT due to fatigue, based on functional assessment at best 3-/5 B quads     ADLs   Overall ADL's : Needs assistance/impaired Lower Body Bathing: Minimal assistance, Sit to/from stand, +2 for safety/equipment, Maximal assistance, +2 for physical assistance, Cueing for safety, Cueing for sequencing Lower Body Bathing Details (indicate cue type and reason): While sitting at EOB, pt applying lotion to her legs with Min A for sitting balance and to facilitate normalize movement of RUE. Max A +2 for sit<>stand with stedy. Pt able to bend forward and able to self correct and bring trunk into upright position. Requiring Min A for posterior lean with fatigue Toilet Transfer: Maximal assistance, +2 for physical assistance, Cueing for safety, Cueing for sequencing(sit<>stand with stedy)  Functional mobility during ADLs: Maximal assistance, +2 for physical assistance(sit<>stand with stedy) General ADL Comments: Pt with decreased balance, strength, cognition, and activity tolerance.    Mobility  Overal bed mobility: Needs Assistance Bed Mobility: Supine to Sit Supine to sit: +2 for physical assistance, Mod assist Sit to supine: +2 for physical assistance, +2 for safety/equipment General bed mobility comments: pt able to grasp R rail with L hand and intiate LE mvmt with vc's. Needed mod A +2 at hips and trunk for getting to EOB    Transfers  Overall transfer level: Needs assistance Equipment used: None Transfer via Lift Equipment: Stedy Transfers: Sit to/from Stand, Risk manager Sit to Stand: +2 safety/equipment, Max assist Stand pivot transfers: +2 physical assistance, Total assist(stedy) General transfer comment: pt was able to grasp stedy and pull for sit to stand but still needed max A +2 for power up and getting hips fwd enough to put flaps of stedy down. Had an easier time standing from elevated surface of stedy to sit to chair.     Ambulation / Gait / Stairs / Wheelchair Mobility  Ambulation/Gait General Gait Details:  unable due to fatigue and weakness    Posture /  Balance Dynamic Sitting Balance Sitting balance - Comments: pt needing min A initially with posterior LOB but progressed to min-guard with ability to wt shift fwd to reach LE's.  Balance Overall balance assessment: Needs assistance Sitting-balance support: Bilateral upper extremity supported, Feet supported Sitting balance-Leahy Scale: Poor Sitting balance - Comments: pt needing min A initially with posterior LOB but progressed to min-guard with ability to wt shift fwd to reach LE's.  Postural control: Posterior lean Standing balance support: Bilateral upper extremity supported Standing balance-Leahy Scale: Zero Standing balance comment: max A to maintain standing position in stedy with knees supported    Special needs/care consideration BiPAP/CPAP: no CPM: no Continuous Drip IV: 0.9% sodium chloride infusion  Dialysis: No        Days: no Life Vest: no Oxygen: no Special Bed: pt currently on air bed  Trach Size: no Wound Vac (area): no      Location: no Skin: blister to right arm; moisture associated skin damage to buttocks and perineum; skin tear along distal portion of right arm, stage III pressure injury to ischial tuberosity, raw open areas to perineum.               Bowel mgmt: questionable continence, unable to assess due to flexiseal use; last BM: 03/16/18. Bladder mgmt: incontinent (use of [place] currently) Diabetic mgmt: No     Previous Home Environment Living Arrangements: Spouse/significant other Available Help at Discharge: Family, Available 24 hours/day Type of Home: House Home Layout: One level Home Access: Stairs to enter Entrance Stairs-Rails: None Entrance Stairs-Number of Steps: 3 Bathroom Shower/Tub: Multimedia programmer: Standard Home Care Services: No Additional Comments: per husband  Discharge Living Setting Plans for Discharge Living Setting: Patient's home, Lives with (comment)(husband) Type of Home at Discharge: House Discharge Home  Layout: One level Discharge Home Access: Stairs to enter Entrance Stairs-Rails: None Entrance Stairs-Number of Steps: 3-4 Discharge Bathroom Shower/Tub: Tub/shower unit, Walk-in shower Discharge Bathroom Toilet: Standard Discharge Bathroom Accessibility: Yes How Accessible: Accessible via walker Does the patient have any problems obtaining your medications?: No  Social/Family/Support Systems Patient Roles: Spouse, Parent(parent to grown children) Contact Information: husband [name]) -cell: [phone]; home: [phone] Anticipated Caregiver: husband and son can assist when he comes over (which is frequently) Anticipated Caregiver's Contact Information: see  above Ability/Limitations of Caregiver: Min A Caregiver Availability: 24/7 Discharge Plan Discussed with Primary Caregiver: Yes(with patient and husband) Is Caregiver In Agreement with Plan?: Yes Does Caregiver/Family have Issues with Lodging/Transportation while Pt is in Rehab?: No   Goals/Additional Needs Patient/Family Goal for Rehab: PT/OT: Min A; SLP: Supervision Expected length of stay: 20-24 days Cultural Considerations: NA Dietary Needs: Dys 3, thin liquids Equipment Needs: TBD Pt/Family Agrees to Admission and willing to participate: Yes Program Orientation Provided & Reviewed with Pt/Caregiver Including Roles  & Responsibilities: Yes(with pt and her husband)  Barriers to Discharge: Home environment access/layout, Inaccessible home environment  Barriers to Discharge Comments: steps to enter home   Decrease burden of Care through IP rehab admission:    Possible need for SNF placement upon discharge: Not anticipated; pt has good social support at home and has good prognosis for further progress.    Patient Condition: This patient's condition remains as documented in the consult dated 03/15/18, in which the Rehabilitation Physician determined and documented that the patient's condition is appropriate for intensive  rehabilitative care in an inpatient rehabilitation facility pending family support availability and pending activity tolerance. These areas have been addressed. Pt's husband is retired and is both able and willing to provide necessary and recommended support at DC. The patient has shown a willingness and ability to participate in therapy sessions, with progression noted in bed mobility functional status as well as sitting balance.   Will admit to inpatient rehab today.  Preadmission Screen Completed By:  Jhonnie Garner, 03/17/2018 11:52 AM ______________________________________________________________________   Discussed status with Dr. Posey Pronto on 03/17/18 at 11:53AM and received telephone approval for admission today.  Admission Coordinator:  Jhonnie Garner, time 11:53AM/Date11/13/19.

## 2018-03-15 NOTE — Progress Notes (Addendum)
Occupational Therapy Treatment Patient Details Name: Maria Avila MRN: [id number] DOB: 13-Dec-1950 Today's Date: 03/15/2018    History of present illness 67yo female coming from [facility] due to decreased responsiveness, found to by hypoxic and hypoglycemic, also in respiratory shock and respiratory failure with emphyema and concern for esophageal rupture. Intubated on 03/04/18, extubated on 03/09/18. Found to have B DVTs on 11/6 and now on heparin. CT positive for acute L parietal infarcts. PMH HTN, DM    OT comments  Pt progressing towards establsihed OT goals. Pt able to maintain sitting balance at EOB with Min guard A-Min A while applying lotion to BLEs. Pt requiring Min A for positional correction with posterior leaning. Pt requiring Max A +2 for sit<>Stand with stedy to transition to recliner. Pt continues to present with decreased cognition, strength, balance, and coordination. Pt motivated to participate with therapy and son present throughout and very supportive. Continue to recommend dc to CIR and will continue to follow acutely as admitted.    Follow Up Recommendations  CIR    Equipment Recommendations  Other (comment)(Defer to next venue)    Recommendations for Other Services Rehab consult;PT consult    Precautions / Restrictions Precautions Precautions: Fall;Other (comment) Precaution Comments: flexiseal  Restrictions Weight Bearing Restrictions: No       Mobility Bed Mobility Overal bed mobility: Needs Assistance Bed Mobility: Supine to Sit     Supine to sit: +2 for physical assistance;Mod assist     General bed mobility comments: pt able to grasp R rail with L hand and intiate LE mvmt with vc's. Needed mod A +2 at hips and trunk for getting to EOB  Transfers Overall transfer level: Needs assistance Equipment used: None Transfers: Sit to/from [facility] Sit to Stand: +2 safety/equipment;Max assist Stand pivot transfers: +2  physical assistance;Total assist(stedy)       General transfer comment: pt was able to grasp stedy and pull for sit to stand but still needed max A +2 for power up and getting hips fwd enough to put flaps of stedy down. Had an easier time standing from elevated surface of stedy to sit to chair.     Balance Overall balance assessment: Needs assistance Sitting-balance support: Bilateral upper extremity supported;Feet supported Sitting balance-Leahy Scale: Poor Sitting balance - Comments: pt needing min A initially with posterior LOB but progressed to min-guard with ability to wt shift fwd to reach LE's.  Postural control: Posterior lean Standing balance support: Bilateral upper extremity supported Standing balance-Leahy Scale: Zero Standing balance comment: max A to maintain standing position in stedy with knees supported                           ADL either performed or assessed with clinical judgement   ADL Overall ADL's : Needs assistance/impaired             Lower Body Bathing: Minimal assistance;Sit to/from stand;+2 for safety/equipment;Maximal assistance;+2 for physical assistance;Cueing for safety;Cueing for sequencing Lower Body Bathing Details (indicate cue type and reason): While sitting at EOB, pt applying lotion to her legs with Min A for sitting balance and to facilitate normalize movement of RUE. Max A +2 for sit<>stand with stedy. Pt able to bend forward and able to self correct and bring trunk into upright position. Requiring Min A for posterior lean with fatigue         Toilet Transfer: Maximal assistance;+2 for physical assistance;Cueing for safety;Cueing for sequencing(sit<>stand  with stedy)           Functional mobility during ADLs: Maximal assistance;+2 for physical assistance(sit<>stand with stedy) General ADL Comments: Pt with decreased balance, strength, cognition, and activity tolerance.     Vision   Additional Comments: Need further  assessment   Perception     Praxis      Cognition Arousal/Alertness: Awake/alert Behavior During Therapy: Flat affect Overall Cognitive Status: Impaired/Different from baseline Area of Impairment: Attention;Memory;Following commands;Safety/judgement;Awareness;Problem solving                   Current Attention Level: Sustained Memory: Decreased short-term memory;Decreased recall of precautions Following Commands: Follows one step commands with increased time;Follows one step commands consistently Safety/Judgement: Decreased awareness of safety;Decreased awareness of deficits Awareness: Intellectual Problem Solving: Slow processing;Difficulty sequencing;Requires verbal cues;Requires tactile cues General Comments: fatigues very easily. Confusion noted during session when pt stated that the lotion bottle in her hand was burning. Pt requiring increased time and cues throughout session        Exercises Exercises: General Lower Extremity General Exercises - Lower Extremity Ankle Circles/Pumps: AROM;Both;10 reps;Seated   Shoulder Instructions       General Comments Son present throughout session. VSS throughout    Pertinent Vitals/ Pain       Pain Assessment: Faces Faces Pain Scale: No hurt Pain Intervention(s): Monitored during session  Home Living                                          Prior Functioning/Environment              Frequency  Min 3X/week        Progress Toward Goals  OT Goals(current goals can now be found in the care plan section)  Progress towards OT goals: Progressing toward goals  Acute Rehab OT Goals Patient Stated Goal: get stronger  OT Goal Formulation: With family Time For Goal Achievement: 03/26/18 Potential to Achieve Goals: Fair ADL Goals Pt Will Perform Grooming: with mod assist;sitting Pt Will Transfer to Toilet: with +2 assist;with mod assist;stand pivot transfer Pt/caregiver will Perform Home Exercise  Program: Increased strength;Both right and left upper extremity;With written HEP provided;With minimal assist Additional ADL Goal #1: Pt will perform bed mobility with moderate assistance in preparation for ADL. Additional ADL Goal #2: Pt will demonstrate fair sitting balance at EOB as a precursor to ADL. Additional ADL Goal #3: Pt will follow one step commands with 50% accuracy. Additional ADL Goal #4: Pt will demonstrate sustained attention as a precursor to ADL.  Plan Discharge plan remains appropriate    Co-evaluation    PT/OT/SLP Co-Evaluation/Treatment: Yes Reason for Co-Treatment: Complexity of the patient's impairments (multi-system involvement);For patient/therapist safety;To address functional/ADL transfers PT goals addressed during session: Mobility/safety with mobility;Strengthening/ROM OT goals addressed during session: ADL's and self-care      AM-PAC PT "6 Clicks" Daily Activity     Outcome Measure   Help from another person eating meals?: A Lot Help from another person taking care of personal grooming?: A Lot Help from another person toileting, which includes using toliet, bedpan, or urinal?: Total Help from another person bathing (including washing, rinsing, drying)?: A Lot Help from another person to put on and taking off regular upper body clothing?: A Lot Help from another person to put on and taking off regular lower body clothing?: Total 6 Click Score: 10  End of Session Equipment Utilized During Treatment: Gait belt;Other (comment)(stedy)  OT Visit Diagnosis: Muscle weakness (generalized) (M62.81);Other symptoms and signs involving cognitive function   Activity Tolerance Patient tolerated treatment well   Patient Left with call bell/phone within reach;with family/visitor present;in chair;with chair alarm set   Nurse Communication Mobility status        Time: 9093-1121 OT Time Calculation (min): 36 min  Charges: OT General Charges $OT Visit: 1  Visit OT Treatments $Self Care/Home Management : 8-22 mins  [place], OTR/L Acute Rehab Pager: [phone] Office: [place] 03/15/2018, 4:01 PM

## 2018-03-16 LAB — CBC
HEMATOCRIT: 26.8 % — AB (ref 36.0–46.0)
Hemoglobin: 8.7 g/dL — ABNORMAL LOW (ref 12.0–15.0)
MCH: 30.4 pg (ref 26.0–34.0)
MCHC: 32.5 g/dL (ref 30.0–36.0)
MCV: 93.7 fL (ref 80.0–100.0)
PLATELETS: 296 10*3/uL (ref 150–400)
RBC: 2.86 MIL/uL — ABNORMAL LOW (ref 3.87–5.11)
RDW: 16.5 % — AB (ref 11.5–15.5)
WBC: 12.8 10*3/uL — AB (ref 4.0–10.5)
nRBC: 0 % (ref 0.0–0.2)

## 2018-03-16 LAB — BASIC METABOLIC PANEL
Anion gap: 5 (ref 5–15)
BUN: 8 mg/dL (ref 8–23)
CALCIUM: 7.7 mg/dL — AB (ref 8.9–10.3)
CO2: 21 mmol/L — AB (ref 22–32)
CREATININE: 0.49 mg/dL (ref 0.44–1.00)
Chloride: 109 mmol/L (ref 98–111)
GFR calc non Af Amer: >60 mL/min (ref >60)
Glucose, Bld: 91 mg/dL (ref 70–99)
Potassium: 3.3 mmol/L — ABNORMAL LOW (ref 3.5–5.1)
Sodium: 135 mmol/L (ref 135–145)

## 2018-03-16 LAB — GLUCOSE, CAPILLARY
Glucose-Capillary: 81 mg/dL (ref 70–99)
Glucose-Capillary: 95 mg/dL (ref 70–99)

## 2018-03-16 MED ORDER — POTASSIUM CHLORIDE CRYS ER 20 MEQ PO TBCR
40.0000 meq | EXTENDED_RELEASE_TABLET | Freq: Once | ORAL | Status: AC
  Administered 2018-03-16: 40 meq via ORAL
  Filled 2018-03-16: qty 2

## 2018-03-16 MED ORDER — LEVOFLOXACIN 750 MG PO TABS
750.0000 mg | ORAL_TABLET | Freq: Every day | ORAL | Status: DC
  Administered 2018-03-16 – 2018-03-17 (×2): 750 mg via ORAL
  Filled 2018-03-16 (×2): qty 1

## 2018-03-16 NOTE — Discharge Instructions (Signed)
Information on my medicine - ELIQUIS (apixaban)  Why was Eliquis prescribed for you? Eliquis was prescribed to treat blood clots that may have been found in the veins of your legs (deep vein thrombosis) or in your lungs (pulmonary embolism) and to reduce the risk of them occurring again.  What do You need to know about Eliquis ? The starting dose is 10 mg (two 5 mg tablets) taken TWICE daily for the FIRST SEVEN (7) DAYS, then on 11/16  the dose is reduced to ONE 5 mg tablet taken TWICE daily.  Eliquis may be taken with or without food.   Try to take the dose about the same time in the morning and in the evening. If you have difficulty swallowing the tablet whole please discuss with your pharmacist how to take the medication safely.  Take Eliquis exactly as prescribed and DO NOT stop taking Eliquis without talking to the doctor who prescribed the medication.  Stopping may increase your risk of developing a new blood clot.  Refill your prescription before you run out.  After discharge, you should have regular check-up appointments with your healthcare provider that is prescribing your Eliquis.    What do you do if you miss a dose? If a dose of ELIQUIS is not taken at the scheduled time, take it as soon as possible on the same day and twice-daily administration should be resumed. The dose should not be doubled to make up for a missed dose.  Important Safety Information A possible side effect of Eliquis is bleeding. You should call your healthcare provider right away if you experience any of the following: ? Bleeding from an injury or your nose that does not stop. ? Unusual colored urine (red or dark brown) or unusual colored stools (red or black). ? Unusual bruising for unknown reasons. ? A serious fall or if you hit your head (even if there is no bleeding).  Some medicines may interact with Eliquis and might increase your risk of bleeding or clotting while on Eliquis. To help avoid  this, consult your healthcare provider or pharmacist prior to using any new prescription or non-prescription medications, including herbals, vitamins, non-steroidal anti-inflammatory drugs (NSAIDs) and supplements.  This website has more information on Eliquis (apixaban): http://[email or web address].com/eliquis/home

## 2018-03-16 NOTE — Progress Notes (Signed)
  Speech Language Pathology Treatment: Dysphagia;Cognitive-Linquistic  Patient Details Name: Maria Avila MRN: [id number] DOB: Oct 27, 1950 Today's Date: 03/16/2018 Time: 0258-5277 SLP Time Calculation (min) (ACUTE ONLY): 16 min  Assessment / Plan / Recommendation Clinical Impression  Pt was pleasant and alert during dysphagia and cognitive-linguisitic treatment session today. With dentures in place, pt independently consumed thin liquid and upgraded regular texture PO. Although no overt s/s aspiration were observed, pt exhibited prolonged mastication with regular. Recommend continue dysphagia 3 (mechanical soft) diet, thin liquids, take small bites and sips in a minimally distracting environment.   Her husband at bedside provided baseline information regarding pt's near independence with ADLs prior to recent hospital admissions. He is retired and stated he will be available to provide 24 hour support and supervision upon d/c to home. Pt and family were receptive to SLPs education regarding external memory strategies such as use of a pill box, calendar, keeping a journal, making lists, etc. SLP also encouraged pt to take an active (supervised) role in ADLs as determined appropriate upon d/c home in order to regain some independence.   No further acute ST is indicated at this time, however pt would greatly benefit from functional cognitive-linguistic intervention in next venue of care. She demonstrates a high level of engagement/interest in therapy and would be an excellent candidate for CIR.        HPI HPI: Pt is a 67 year old admitted with respiratory failure, left lung empyema s/p CT placement 10/31, and septic shock. ETT 10/31-11/4. MRI 11/6 showed multifocal, bilateral acute ischemia with largest infarction in the L parietal lobe. Additional areas include the L frontal lobe, R parietal lobe, and pons. PMH: malnutrition and unexplained FTT (s/p recent GI and psych consults), HTN, DM.  MBSS 11/8  revealed primarily oral dysphagia with adequte airway protection although premature spillage was noted.      SLP Plan  All goals met       Recommendations  Diet recommendations: Dysphagia 3 (mechanical soft);Thin liquid Liquids provided via: Cup;Straw Medication Administration: Crushed with puree Supervision: Patient able to self feed;Intermittent supervision to cue for compensatory strategies Compensations: Minimize environmental distractions;Slow rate;Small sips/bites Postural Changes and/or Swallow Maneuvers: Seated upright 90 degrees                Oral Care Recommendations: Oral care BID Follow up Recommendations: Inpatient Rehab SLP Visit Diagnosis: Dysphagia, unspecified (R13.10);Cognitive communication deficit (O24.235) Plan: All goals met       Maria Avila, Student SLP                 Maria Avila 03/16/2018, 11:40 AM

## 2018-03-16 NOTE — Care Management (Signed)
#    7  S/ W  [facility] @ [facility] R X # [phone]  ELIQUIS  5 MG BID COVER- YES CO-PAY- $ 45.00 TIER- 3 DRUG PRIOR APPROVAL- NO  PREFERRED PHARMACY : YES  -  [facility]

## 2018-03-16 NOTE — Progress Notes (Signed)
Nutrition Follow Up  DOCUMENTATION CODES:   Non-severe (moderate) malnutrition in context of chronic illness  INTERVENTION:    Ensure Enlive po BID, each supplement provides 350 kcal and 20 grams of protein  NUTRITION DIAGNOSIS:   Moderate Malnutrition related to chronic illness(chronic colitis) as evidenced by moderate muscle depletion, moderate fat depletion, mild fat depletion, percent weight loss(10% weight loss within 6 months), ongoing  GOAL:   Patient will meet greater than or equal to 90% of their needs, progressing   MONITOR:   PO intake, Supplement acceptance, Labs, Skin, Weight trends, I & O's  ASSESSMENT:   67 yo female with PMH of HTN, colitis, DM, and malnutrition who was admitted on 10/31 with AMS, left empyema. Intubated and chest tube placed on 10/31.  11/2 TPN started for possible esophageal perforation 11/4 extubated 11/9 TPN discontinued  Pt transferred from 24M-MICU to 4E-CV SURG 11/6. S/p bedside swallow evaluation 11/6. SLP recommending NPO status. S/p MBSS 11/8. Pt with mild-moderate dysphagia. Advanced to Dys 1 thin liquids.  Pt now on a Dys 3-thin liquid diet. PO intake 25-50% per flowsheet records. Ensure Enlive supplements ordered 11/9. Pt drinking some. Labs & medications reviewed. K 3.3 (L). CBG's [id number].  Diet Order:   Diet Order            DIET DYS 3 Room service appropriate? Yes; Fluid consistency: Thin  Diet effective now             EDUCATION NEEDS:   No education needs have been identified at this time  Skin:  Skin Assessment: Skin Integrity Issues: Skin Integrity Issues:: Stage III, Other Stage III: buttocks/ischium  Other: wound to perineum  Last BM:  11/11  Height:   Ht Readings from Last 1 Encounters:  03/10/18 5\' 2"  (1.575 m)    Weight:   Wt Readings from Last 1 Encounters:  02/16/18 48.7 kg    Ideal Body Weight:  54.5 kg  BMI:  Body mass index is 26.25 kg/m.  Estimated Nutritional Needs:   Kcal:   1700-1900  Protein:  95-110 gms  Fluid:  >/=1.7 L  Arthur Holms, RD, LDN Pager #: [phone] After-Hours Pager #: [phone]

## 2018-03-16 NOTE — Progress Notes (Signed)
PROGRESS NOTE                                                                                                                                                                                                             Patient Demographics:    Maria Avila, is a 67 y.o. female, DOB - 1951/03/20, LOV:[id number]  Admit date - 03/04/2018   Admitting Physician [name], MD  Outpatient Primary MD for the patient is [name], MD  LOS - 12   Chief Complaint  Patient presents with  . Altered Mental Status       Brief Narrative    67 y.o. female with history of malnutrition, hypertension, diabetes and colitis , was admitted to ICU 03/04/2018 due to septic shock from empyema, patient with recent hospitalization letter to that significant for endoscopy with no acute findings, she remained in SNF for 4 days, then she presents with septic shock, work-up significant for empyema, requiring pressors, intubation, became off pressors, extubated, then she was noted to have moving her right side, and had left gaze preference, work-up significant for acute CVA, embolic, as well she had acute DVT, she was transferred to [facility] care 03/11/2018.  10/31 ETT placed 10/31 Chest tube placed 11/1 TPA/pulmozyme administration  11/4 extubated   Subjective:    Maria Avila today denies any complaints, no significant events overnight   Assessment  & Plan :    Active Problems:   Severe protein-calorie malnutrition (HCC)   Empyema (HCC)   Pressure injury of skin   Acute respiratory failure with hypercapnia (HCC)   Malnutrition of moderate degree   Cerebral embolism with cerebral infarction   Chest tube in place   Esophageal perforation   Perforation esophagus   Sepsis with acute renal failure (HCC)   Acute deep vein thrombosis (DVT) of both peroneal veins   Leukocytosis   Sepsis due to Pseudomonas species (HCC)   Dysphagia, post-stroke   Benign  essential HTN   Acute deep vein thrombosis (DVT) of distal vein of both lower extremities (HCC)   Sinus tachycardia  Septic shock due to empyema -Present on admission, requiring pressure support, CT chest showing free air and large pleural effusion, status post chest tube 07/03/2017,. - intubated on admission 03/04/2018, extubated 03/09/2018 -Empyema culture growing Pseudomonas, E. coli, enterococcus, initially on Unasyn on  Unasyn, transitioned 03/12/2018 on Zosyn, cytosis trending down, I have discussed with ID, recommendation for total of 1 days of therapy, 10 you with Zosyn during hospital stay, upon discharge she can be transitioned to Cipro and Augmentin. -Chest tube management per PCCM, tube discontinued 03/13/2018 by pulmonary.  As there is a small residual effusion with no clear evidence of loculation, chest tube was discontinued as it is not draining, as well as nonfunctional due to being kinked. -This is most likely in the setting of aspiration pneumonia, no evidence of esophageal perforation on esophgiogram, she was started on a diet after her barium swallow evaluation. -TPN stopped 03/13/2018, and has been on diet, has been advanced by SLP, improving  Acute CVA - Left parietal small infarcts, left MCA/ACA, right parietal and pontine punctate infarcts, -Most likely embolic pattern, cardioembolic versus DVT in the setting of PFO, versus endocarditis, versus hypercoagulable state due to severe sepsis. -on heparin for her lower extremity DVT, TCD bubble study with no PFO at rest, patient not cooperative on Valsalva test , now there is no further procedures anticipated, now on Eliquis  Acute respiratory failure -Due to empyema and septic shock on admission, requiring intubation, currently extubated, back to room air  Bilateral lower extremity DVT -Lower extremity venous Doppler showing bilateral lower extremity DVT with right posterior tibial veins and left peroneal veins distally -TCD bubble  study no PFO at rest, not cooperative on valsalva test -Now she can take oral, no further procedures are anticipated, she was switched from heparin gtt. to Eliquis  Diabetes -Patient with history of diabetes mellitus, but her A1c is 5.1, never required insulin, actually her CBG on the lower side intermittently, so I have discontinued her sliding scale  Protein calorie malnutrition -Was on TPN, currently on dysphagia 3 diet, appetite is improving, encouraged to drink Ensure today  Normocytic anemia -He was transfused 1 unit PRBC for hemoglobin of 7.7, hemoglobin remained stable,   Code Status : partial  Family Communication  : husband at bedside  Disposition Plan  : cir CONSULTED  Consults  : PCCM, CT surgery, neuurology  Procedures  :  10/31 ETT placed 10/31 Chest tube placed, removed 03/13/2018 11/1 TPA/pulmozyme administration  11/4 extubated  Left subclavian central line will be discontinued 01/2018  DVT Prophylaxis  :  eliquis  Lab Results  Component Value Date   PLT 296 03/16/2018    Antibiotics  :    Anti-infectives (From admission, onward)   Start     Dose/Rate Route Frequency Ordered Stop   03/12/18 1400  piperacillin-tazobactam (ZOSYN) IVPB 3.375 g     3.375 g 12.5 mL/hr over 240 Minutes Intravenous Every 8 hours 03/12/18 1332     03/07/18 1000  Ampicillin-Sulbactam (UNASYN) 3 g in sodium chloride 0.9 % 100 mL IVPB  Status:  Discontinued     3 g 200 mL/hr over 30 Minutes Intravenous Every 8 hours 03/07/18 0948 03/12/18 1332   03/05/18 1200  vancomycin (VANCOCIN) 500 mg in sodium chloride 0.9 % 100 mL IVPB  Status:  Discontinued     500 mg 100 mL/hr over 60 Minutes Intravenous Every 24 hours 03/04/18 1801 03/07/18 0948   03/04/18 2200  piperacillin-tazobactam (ZOSYN) IVPB 3.375 g  Status:  Discontinued     3.375 g 12.5 mL/hr over 240 Minutes Intravenous Every 8 hours 03/04/18 1801 03/07/18 0948   03/04/18 0915  ceFEPIme (MAXIPIME) 2 g in sodium chloride 0.9 %  100 mL IVPB     2 g  200 mL/hr over 30 Minutes Intravenous  Once 03/04/18 0910 03/04/18 1000   03/04/18 0915  metroNIDAZOLE (FLAGYL) IVPB 500 mg  Status:  Discontinued     500 mg 100 mL/hr over 60 Minutes Intravenous Every 8 hours 03/04/18 0910 03/04/18 1801   03/04/18 0915  vancomycin (VANCOCIN) IVPB 1000 mg/200 mL premix     1,000 mg 200 mL/hr over 60 Minutes Intravenous  Once 03/04/18 0910 03/04/18 1338        Objective:   Vitals:   03/15/18 0359 03/15/18 2038 03/16/18 0515 03/16/18 1137  BP: 120/89 121/87 129/87 (!) 132/97  Pulse: (!) 103 (!) 108 96 96  Resp:  16  19  Temp: 98.3 F (36.8 C) 98.1 F (36.7 C) 98.6 F (37 C) 98.1 F (36.7 C)  TempSrc: Oral Oral Oral Oral  SpO2: 100%  98%   Weight:      Height:        Wt Readings from Last 3 Encounters:  02/16/18 48.7 kg  02/05/18 48.1 kg  08/17/17 64.9 kg     Intake/Output Summary (Last 24 hours) at 03/16/2018 1332 Last data filed at 03/16/2018 0956 Gross per 24 hour  Intake 360 ml  Output 1175 ml  Net -815 ml     Physical Exam   Awake Alert, appropriate, pleasant  symmetrical Chest wall movement, Good air movement bilaterally, CTAB RRR,No Gallops,Rubs or new Murmurs, No Parasternal Heave +ve B.Sounds, Abd Soft, No tenderness, No rebound - guarding or rigidity. No Cyanosis, Clubbing , lower extremity edema, patient  with right-sided weakness   Left subclavian discontinued 03/13/2018, chest tube discontinued 03/13/2018    Data Review:    CBC Recent Labs  Lab 03/10/18 0451  03/12/18 0330 03/13/18 0234 03/14/18 0351 03/15/18 0406 03/16/18 0327  WBC 14.2*   < > 18.0* 17.9* 16.3* 14.1* 12.8*  HGB 8.4*   < > 7.7* 9.5* 9.0* 8.5* 8.7*  HCT 26.0*   < > 23.6* 29.5* 27.7* 26.0* 26.8*  PLT 135*   < > 173 206 243 276 296  MCV 90.9   < > 94.0 92.8 93.9 92.2 93.7  MCH 29.4   < > 30.7 29.9 30.5 30.1 30.4  MCHC 32.3   < > 32.6 32.2 32.5 32.7 32.5  RDW 15.6*   < > 16.3* 16.1* 16.8* 16.4* 16.5*  LYMPHSABS 1.7   --   --   --   --   --   --   MONOABS 0.6  --   --   --   --   --   --   EOSABS 0.0  --   --   --   --   --   --   BASOSABS 0.0  --   --   --   --   --   --    < > = values in this interval not displayed.    Chemistries  Recent Labs  Lab 03/10/18 0451 03/11/18 0500 03/12/18 0330 03/13/18 0234 03/14/18 0351 03/16/18 0327  NA 139 140 138 138 137 135  K 3.7 3.9 4.0 4.1 4.2 3.3*  CL 108 110 108 111 111 109  CO2 28 25 26 24 22  21*  GLUCOSE 125* 80 106* 87 91 91  BUN 17 14 14 17 16 8   CREATININE 0.42* 0.40* 0.39* 0.48 0.58 0.49  CALCIUM 7.8* 7.7* 7.6* 7.8* 7.8* 7.7*  MG  --  1.7 1.7  --   --   --   AST 49*  34  --   --   --   --   ALT 49* 36  --   --   --   --   ALKPHOS 408* 326*  --   --   --   --   BILITOT 0.7 0.5  --   --   --   --    ------------------------------------------------------------------------------------------------------------------ No results for input(s): CHOL, HDL, LDLCALC, TRIG, CHOLHDL, LDLDIRECT in the last 72 hours.  Lab Results  Component Value Date   HGBA1C 5.1 03/10/2018   ------------------------------------------------------------------------------------------------------------------ No results for input(s): TSH, T4TOTAL, T3FREE, THYROIDAB in the last 72 hours.  Invalid input(s): FREET3 ------------------------------------------------------------------------------------------------------------------ Recent Labs    03/15/18 1713  VITAMINB12 945*  FOLATE 9.0    Coagulation profile No results for input(s): INR, PROTIME in the last 168 hours.  No results for input(s): DDIMER in the last 72 hours.  Cardiac Enzymes No results for input(s): CKMB, TROPONINI, MYOGLOBIN in the last 168 hours.  Invalid input(s): CK ------------------------------------------------------------------------------------------------------------------ No results found for: BNP  Inpatient Medications  Scheduled Meds: . apixaban  10 mg Oral BID   Followed by  .  [START ON 03/20/2018] apixaban  5 mg Oral BID  . chlorhexidine  15 mL Mouth Rinse BID  . Chlorhexidine Gluconate Cloth  6 each Topical Daily  . collagenase   Topical Daily  . feeding supplement (ENSURE ENLIVE)  237 mL Oral TID BM  . Gerhardt's butt cream   Topical Daily  . mouth rinse  15 mL Mouth Rinse q12n4p  . pantoprazole (PROTONIX) IV  40 mg Intravenous Q24H  . sodium chloride flush  10-40 mL Intracatheter Q12H   Continuous Infusions: . sodium chloride Stopped (03/04/18 2139)  . sodium chloride 75 mL/hr at 03/15/18 0639  . piperacillin-tazobactam (ZOSYN)  IV 3.375 g (03/16/18 0513)   PRN Meds:.bisacodyl, sodium chloride flush  Micro Results No results found for this or any previous visit (from the past 240 hour(s)).  Radiology Reports Ct Angio Head W Or Wo Contrast  Result Date: 03/09/2018 CLINICAL DATA:  67 y/o F; patient extubated yesterday. Right-sided weakness and slow speech. Increased lethargy. EXAM: CT ANGIOGRAPHY HEAD AND NECK CT PERFUSION BRAIN TECHNIQUE: Multidetector CT imaging of the head and neck was performed using the standard protocol during bolus administration of intravenous contrast. Multiplanar CT image reconstructions and MIPs were obtained to evaluate the vascular anatomy. Carotid stenosis measurements (when applicable) are obtained utilizing NASCET criteria, using the distal internal carotid diameter as the denominator. Multiphase CT imaging of the brain was performed following IV bolus contrast injection. Subsequent parametric perfusion maps were calculated using RAPID software. CONTRAST:  90 cc Isovue 370 COMPARISON:  03/09/2018, 03/04/2018, 02/16/2018 CT head. 02/16/2018 MRI head. FINDINGS: CTA NECK FINDINGS Aortic arch: Standard branching. Imaged portion shows no evidence of aneurysm or dissection. No significant stenosis of the major arch vessel origins. Mild calcific aortic atherosclerosis. Right carotid system: No evidence of dissection, stenosis (50% or  greater) or occlusion. Mild non stenotic calcific atherosclerosis of the carotid bifurcation. Left carotid system: No evidence of dissection, stenosis (50% or greater) or occlusion. Minimal non stenotic calcific atherosclerosis of the carotid bifurcation. Vertebral arteries: Left dominant, diminutive right vertebral artery. No evidence of dissection, stenosis (50% or greater) or occlusion. Skeleton: Moderate spondylosis of the cervical spine with multilevel disc and facet degenerative changes. No high-grade bony canal stenosis. Other neck: Negative. Upper chest: Negative. Review of the MIP images confirms the above findings CTA HEAD FINDINGS Anterior circulation: No significant  stenosis, proximal occlusion, aneurysm, or vascular malformation. Mild non stenotic calcific atherosclerosis of the carotid siphons. Posterior circulation: No significant stenosis, proximal occlusion, aneurysm, or vascular malformation. Venous sinuses: As permitted by contrast timing, patent. Anatomic variants: Right vertebral artery largely terminates in the right PICA with diminutive contribution to the basilar. Complete circle-of-Willis. Large left A1, large anterior communicating artery, diminutive right A1, normal variant Review of the MIP images confirms the above findings CT Brain Perfusion Findings: CBF (<30%) Volume: 70mL Perfusion (Tmax>6.0s) volume: 24mL Mismatch Volume: 49mL Infarction Location:Left parietal lobe subacute infarction visible on noncontrast CT, pseudonormalized perfusion. IMPRESSION: 1. Patent carotid and vertebral arteries. No dissection, aneurysm, or hemodynamically significant stenosis utilizing NASCET criteria. 2. Patent anterior and posterior intracranial circulation. No large vessel occlusion, aneurysm, or significant stenosis. 3. Left parietal lobe subacute infarction visible on noncontrast CT with pseudonormalized perfusion. 4. No perfusion anomaly to suggest interval acute infarct. Electronically Signed   By:  Kristine Garbe M.D.   On: 03/09/2018 18:44   Ct Head Wo Contrast  Result Date: 03/09/2018 CLINICAL DATA:  Residual right-sided weakness. EXAM: CT HEAD WITHOUT CONTRAST TECHNIQUE: Contiguous axial images were obtained from the base of the skull through the vertex without intravenous contrast. COMPARISON:  03/04/2018 CT.  02/16/2018 MRI. FINDINGS: Brain: There is acute to subacute infarction in the left parietal lobe consistent with left MCA branch vessel infarction. The brain shows low-density with mild swelling. No hemorrhage. No other abnormal brain finding. No mass, hydrocephalus or extra-axial collection. Vascular: There is atherosclerotic calcification of the major vessels at the base of the brain. Skull: Negative Sinuses/Orbits: Clear/normal Other: None IMPRESSION: Acute/subacute infarction in the left parietal lobe consistent with MCA branch vessel infarction. No evidence of hemorrhage or mass effect. Electronically Signed   By: Nelson Chimes M.D.   On: 03/09/2018 13:31   Ct Head Wo Contrast  Result Date: 03/04/2018 CLINICAL DATA:  Altered level of consciousness EXAM: CT HEAD WITHOUT CONTRAST TECHNIQUE: Contiguous axial images were obtained from the base of the skull through the vertex without intravenous contrast. COMPARISON:  02/16/2018 FINDINGS: Brain: No evidence of acute infarction, hemorrhage, hydrocephalus, extra-axial collection or mass lesion/mass effect. Vascular: No hyperdense vessel or unexpected calcification. Skull: Normal. Negative for fracture or focal lesion. Sinuses/Orbits: No acute finding. Other: None. IMPRESSION: No acute abnormality noted.  No change from the prior exam. Electronically Signed   By: Inez Catalina M.D.   On: 03/04/2018 09:33   Ct Head Wo Contrast  Result Date: 02/16/2018 CLINICAL DATA:  Generalize weakness and slurred speech for 2 weeks. EXAM: CT HEAD WITHOUT CONTRAST TECHNIQUE: Contiguous axial images were obtained from the base of the skull through  the vertex without intravenous contrast. COMPARISON:  04/01/2005 FINDINGS: Brain: No evidence of acute infarction, hemorrhage, hydrocephalus, extra-axial collection or mass lesion/mass effect. Vascular: Mild intracranial arterial vascular calcifications. Skull: Calvarium appears intact. Sinuses/Orbits: Paranasal sinuses and mastoid air cells are clear. Other: None. IMPRESSION: No acute intracranial abnormality. Electronically Signed   By: Lucienne Capers M.D.   On: 02/16/2018 06:24   Ct Angio Neck W Or Wo Contrast  Result Date: 03/09/2018 CLINICAL DATA:  67 y/o F; patient extubated yesterday. Right-sided weakness and slow speech. Increased lethargy. EXAM: CT ANGIOGRAPHY HEAD AND NECK CT PERFUSION BRAIN TECHNIQUE: Multidetector CT imaging of the head and neck was performed using the standard protocol during bolus administration of intravenous contrast. Multiplanar CT image reconstructions and MIPs were obtained to evaluate the vascular anatomy. Carotid stenosis measurements (when applicable) are obtained  utilizing NASCET criteria, using the distal internal carotid diameter as the denominator. Multiphase CT imaging of the brain was performed following IV bolus contrast injection. Subsequent parametric perfusion maps were calculated using RAPID software. CONTRAST:  90 cc Isovue 370 COMPARISON:  03/09/2018, 03/04/2018, 02/16/2018 CT head. 02/16/2018 MRI head. FINDINGS: CTA NECK FINDINGS Aortic arch: Standard branching. Imaged portion shows no evidence of aneurysm or dissection. No significant stenosis of the major arch vessel origins. Mild calcific aortic atherosclerosis. Right carotid system: No evidence of dissection, stenosis (50% or greater) or occlusion. Mild non stenotic calcific atherosclerosis of the carotid bifurcation. Left carotid system: No evidence of dissection, stenosis (50% or greater) or occlusion. Minimal non stenotic calcific atherosclerosis of the carotid bifurcation. Vertebral arteries: Left  dominant, diminutive right vertebral artery. No evidence of dissection, stenosis (50% or greater) or occlusion. Skeleton: Moderate spondylosis of the cervical spine with multilevel disc and facet degenerative changes. No high-grade bony canal stenosis. Other neck: Negative. Upper chest: Negative. Review of the MIP images confirms the above findings CTA HEAD FINDINGS Anterior circulation: No significant stenosis, proximal occlusion, aneurysm, or vascular malformation. Mild non stenotic calcific atherosclerosis of the carotid siphons. Posterior circulation: No significant stenosis, proximal occlusion, aneurysm, or vascular malformation. Venous sinuses: As permitted by contrast timing, patent. Anatomic variants: Right vertebral artery largely terminates in the right PICA with diminutive contribution to the basilar. Complete circle-of-Willis. Large left A1, large anterior communicating artery, diminutive right A1, normal variant Review of the MIP images confirms the above findings CT Brain Perfusion Findings: CBF (<30%) Volume: 65mL Perfusion (Tmax>6.0s) volume: 29mL Mismatch Volume: 47mL Infarction Location:Left parietal lobe subacute infarction visible on noncontrast CT, pseudonormalized perfusion. IMPRESSION: 1. Patent carotid and vertebral arteries. No dissection, aneurysm, or hemodynamically significant stenosis utilizing NASCET criteria. 2. Patent anterior and posterior intracranial circulation. No large vessel occlusion, aneurysm, or significant stenosis. 3. Left parietal lobe subacute infarction visible on noncontrast CT with pseudonormalized perfusion. 4. No perfusion anomaly to suggest interval acute infarct. Electronically Signed   By: Kristine Garbe M.D.   On: 03/09/2018 18:44   Ct Chest Wo Contrast  Result Date: 03/12/2018 CLINICAL DATA:  Assess empyema. EXAM: CT CHEST WITHOUT CONTRAST TECHNIQUE: Multidetector CT imaging of the chest was performed following the standard protocol without IV  contrast. COMPARISON:  Chest radiograph performed 03/08/2018, and CT of the chest performed 03/04/2018 FINDINGS: Cardiovascular: Scattered coronary artery calcifications are seen. The heart is normal in size. The patient's left subclavian line is noted at the distal SVC. Mediastinum/Nodes: No pericardial effusion is identified. The thyroid gland is unremarkable in appearance. No mediastinal lymphadenopathy is seen. No axillary lymphadenopathy is appreciated. Lungs/Pleura: There has been placement of a left apical chest tube, with decrease in the size of the left-sided pleural effusion. Residual small bilateral pleural effusions are noted, somewhat loculated on the left. A new focus of increased attenuation is noted within the left-sided pleural effusion, which may reflect trace hemorrhage. No definite parenchymal necrosis is seen. There is collapse of much of the left lower lung lobe. Mild right-sided atelectasis is noted. The expanded portions of both lungs are grossly clear. No definite pneumothorax is seen. Slight irregularity in the contour of the proximal trachea may reflect sequelae of prior instrumentation. Upper Abdomen: A small amount of ascites is noted tracking about the liver. The visualized portions of the liver and spleen are otherwise unremarkable. Musculoskeletal: No acute osseous abnormalities are identified. The visualized musculature is unremarkable in appearance. IMPRESSION: 1. Placement of left apical chest tube,  with decrease in the size of the left-sided pleural effusion. Residual small bilateral pleural effusions, somewhat loculated on the left. Empyema cannot be excluded, given clinical concern. 2. New focus of increased attenuation within the left-sided pleural effusion may reflect trace hemorrhage. No definite evidence of parenchymal necrosis. 3. Collapse of much of the left lower lung lobe. Mild right-sided atelectasis. 4. Scattered coronary artery calcifications seen. 5. Small amount of  ascites noted tracking about the liver. Electronically Signed   By: Garald Balding M.D.   On: 03/12/2018 23:00   Ct Chest Wo Contrast  Result Date: 03/04/2018 CLINICAL DATA:  Shortness of breath, pneumonia EXAM: CT CHEST WITHOUT CONTRAST TECHNIQUE: Multidetector CT imaging of the chest was performed following the standard protocol without IV contrast. COMPARISON:  Chest x-ray 03/04/2018 FINDINGS: Cardiovascular: Heart is normal size. Aorta is normal caliber with scattered aortic calcifications. Moderate coronary artery calcifications in the left anterior descending coronary artery. Mediastinum/Nodes: Reactive borderline sized mediastinal lymph nodes. Lungs/Pleura: Complex large left pleural effusions with loculation and pleural gas. Cannot exclude bronchopleural fistula or empyema. Collapse of much of the left lung with only a small amount of aerated left upper lobe. Nodular airspace opacities in the right upper lobe and superior segment of the right lower lobe with trace right pleural effusion. Upper Abdomen: Imaging into the upper abdomen shows no acute findings. Musculoskeletal: Chest wall soft tissues are unremarkable. Degenerative changes in the thoracic spine. No acute bony abnormality. IMPRESSION: Large complex left pleural effusion with pleural gas and extensive loculations. This could reflect bronchopleural fistula or empyema. Collapse of much of the left lung with only a small amount of aerated left upper lobe. Nodular ground-glass airspace opacities centrally in the right upper lobe and in the superior segment of the right lower lobe, likely infectious/inflammatory. Coronary artery disease, aortic atherosclerosis. Electronically Signed   By: Rolm Baptise M.D.   On: 03/04/2018 11:39   Mr Brain Wo Contrast  Result Date: 03/10/2018 CLINICAL DATA:  Stroke follow-up EXAM: MRI HEAD WITHOUT CONTRAST TECHNIQUE: Multiplanar, multiecho pulse sequences of the brain and surrounding structures were obtained  without intravenous contrast. COMPARISON:  Brain MRI 02/16/2018 FINDINGS: BRAIN: There are multiple foci of abnormal diffusion restriction, the largest of which is located in the left parietal lobe. Other foci are located in the posterior left frontal lobe, right parietal lobe and the pons. There is no midline shift or mass effect. Mild cytotoxic edema of the left parietal lobe. Minimal white matter hyperintensity for age. Brain parenchyma is otherwise normal. The cerebral and cerebellar volume are age-appropriate. Susceptibility-sensitive sequences show no chronic microhemorrhage or superficial siderosis. VASCULAR: Major intracranial arterial and venous sinus flow voids are normal. SKULL AND UPPER CERVICAL SPINE: Calvarial bone marrow signal is normal. There is no skull base mass. Visualized upper cervical spine and soft tissues are normal. SINUSES/ORBITS: No fluid levels or advanced mucosal thickening. No mastoid or middle ear effusion. The orbits are normal. IMPRESSION: 1. Multifocal acute ischemia, spread throughout multiple bilateral vascular territories. 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 2. The largest infarction is in the left parietal lobe. Other sites include the posterior left frontal lobe, right parietal lobe and pons. 3. No hemorrhage or mass effect. Electronically Signed   By: Ulyses Jarred M.D.   On: 03/10/2018 06:17   Mr Brain Wo Contrast  Result Date: 02/16/2018 CLINICAL DATA:  Weight loss and weakness. EXAM: MRI HEAD WITHOUT CONTRAST TECHNIQUE: Multiplanar, multiecho pulse sequences of the brain and surrounding structures were  obtained without intravenous contrast. COMPARISON:  CT same day FINDINGS: Brain: Generalized atrophy. Diffusion imaging does not show any acute or subacute infarction. The brainstem and cerebellum are normal. Mild chronic small-vessel ischemic change of the cerebral hemispheric deep white matter. No cortical or large vessel territory infarction. No mass  lesion, hemorrhage, hydrocephalus or extra-axial collection. Vascular: Major vessels at the base of the brain show flow. Skull and upper cervical spine: Negative Sinuses/Orbits: Clear/normal Other: None IMPRESSION: No acute or reversible finding. Generalized atrophy. Mild chronic small-vessel ischemic change of the cerebral hemispheric white matter. Electronically Signed   By: Nelson Chimes M.D.   On: 02/16/2018 21:52   Nm Hepato W/eject Fract  Result Date: 02/18/2018 CLINICAL DATA:  Weight loss. EXAM: NUCLEAR MEDICINE HEPATOBILIARY IMAGING WITH GALLBLADDER EF TECHNIQUE: Sequential images of the abdomen were obtained out to 60 minutes following intravenous administration of radiopharmaceutical. After oral ingestion of Ensure, gallbladder ejection fraction was determined. At 60 min, normal ejection fraction is greater than 33%. RADIOPHARMACEUTICALS:  5.3 mCi Tc-11m  Choletec IV COMPARISON:  None. FINDINGS: Prompt uptake and biliary excretion of activity by the liver is seen. Gallbladder activity is visualized, consistent with patency of cystic duct. Biliary activity passes into small bowel, consistent with patent common bile duct. The gallbladder emptied spontaneously before the Ensure. As a result, an ejection fraction cannot be accurately calculated. That being said, the gallbladder is not visualized after ensure and gallbladder emptying is near-complete. IMPRESSION: The gallbladder emptied spontaneously before consumption of Ensure. While an ejection fraction cannot be calculated, gallbladder emptying is near complete. Electronically Signed   By: Dorise Bullion III M.D   On: 02/18/2018 13:40   Ct Cerebral Perfusion W Contrast  Result Date: 03/09/2018 CLINICAL DATA:  67 y/o F; patient extubated yesterday. Right-sided weakness and slow speech. Increased lethargy. EXAM: CT ANGIOGRAPHY HEAD AND NECK CT PERFUSION BRAIN TECHNIQUE: Multidetector CT imaging of the head and neck was performed using the standard  protocol during bolus administration of intravenous contrast. Multiplanar CT image reconstructions and MIPs were obtained to evaluate the vascular anatomy. Carotid stenosis measurements (when applicable) are obtained utilizing NASCET criteria, using the distal internal carotid diameter as the denominator. Multiphase CT imaging of the brain was performed following IV bolus contrast injection. Subsequent parametric perfusion maps were calculated using RAPID software. CONTRAST:  90 cc Isovue 370 COMPARISON:  03/09/2018, 03/04/2018, 02/16/2018 CT head. 02/16/2018 MRI head. FINDINGS: CTA NECK FINDINGS Aortic arch: Standard branching. Imaged portion shows no evidence of aneurysm or dissection. No significant stenosis of the major arch vessel origins. Mild calcific aortic atherosclerosis. Right carotid system: No evidence of dissection, stenosis (50% or greater) or occlusion. Mild non stenotic calcific atherosclerosis of the carotid bifurcation. Left carotid system: No evidence of dissection, stenosis (50% or greater) or occlusion. Minimal non stenotic calcific atherosclerosis of the carotid bifurcation. Vertebral arteries: Left dominant, diminutive right vertebral artery. No evidence of dissection, stenosis (50% or greater) or occlusion. Skeleton: Moderate spondylosis of the cervical spine with multilevel disc and facet degenerative changes. No high-grade bony canal stenosis. Other neck: Negative. Upper chest: Negative. Review of the MIP images confirms the above findings CTA HEAD FINDINGS Anterior circulation: No significant stenosis, proximal occlusion, aneurysm, or vascular malformation. Mild non stenotic calcific atherosclerosis of the carotid siphons. Posterior circulation: No significant stenosis, proximal occlusion, aneurysm, or vascular malformation. Venous sinuses: As permitted by contrast timing, patent. Anatomic variants: Right vertebral artery largely terminates in the right PICA with diminutive contribution  to the basilar. Complete  circle-of-Willis. Large left A1, large anterior communicating artery, diminutive right A1, normal variant Review of the MIP images confirms the above findings CT Brain Perfusion Findings: CBF (<30%) Volume: 40mL Perfusion (Tmax>6.0s) volume: 5mL Mismatch Volume: 71mL Infarction Location:Left parietal lobe subacute infarction visible on noncontrast CT, pseudonormalized perfusion. IMPRESSION: 1. Patent carotid and vertebral arteries. No dissection, aneurysm, or hemodynamically significant stenosis utilizing NASCET criteria. 2. Patent anterior and posterior intracranial circulation. No large vessel occlusion, aneurysm, or significant stenosis. 3. Left parietal lobe subacute infarction visible on noncontrast CT with pseudonormalized perfusion. 4. No perfusion anomaly to suggest interval acute infarct. Electronically Signed   By: Kristine Garbe M.D.   On: 03/09/2018 18:44   Dg Chest Port 1 View  Result Date: 03/14/2018 CLINICAL DATA:  Followup for pneumothorax. EXAM: PORTABLE CHEST 1 VIEW COMPARISON:  03/13/2018 and older studies. FINDINGS: No pneumothorax. There is left greater than right lung base opacity consistent with a combination of atelectasis and pleural fluid, without significant change from the previous day's study. No new lung abnormalities. Left subclavian central venous line has been removed. IMPRESSION: 1. No pneumothorax. 2. Persistent, left greater than right lung base opacities consistent with combination of pleural fluid and atelectasis. 3. No new abnormalities.  No change from the most recent prior exam. Electronically Signed   By: Lajean Manes M.D.   On: 03/14/2018 08:05   Dg Chest Port 1 View  Result Date: 03/13/2018 CLINICAL DATA:  Status post chest tube removal. EXAM: PORTABLE CHEST 1 VIEW COMPARISON:  Chest CT, 03/12/2018.  Chest radiographs, 03/08/2018. FINDINGS: Status post left chest tube removal.  No pneumothorax. Persistent lung base opacities, left  greater than right, consistent with a combination of pleural fluid and atelectasis. No new lung abnormalities. Left subclavian central venous line is stable and well positioned. IMPRESSION: 1. No pneumothorax following left chest tube removal. 2. Stable, left greater than right, lung base opacity consistent with pleural effusions with atelectasis. Electronically Signed   By: Lajean Manes M.D.   On: 03/13/2018 16:24   Dg Chest Port 1 View  Result Date: 03/08/2018 CLINICAL DATA:  67 year old female with empyema. Subsequent encounter. EXAM: PORTABLE CHEST 1 VIEW COMPARISON:  03/06/2018 chest x-ray and 03/04/2018 chest CT. FINDINGS: Left-sided chest tube is in place. Left chest wall subcutaneous emphysema once again noted. Questionable tiny left apical pneumothorax versus overlapping structures. Persistent consolidation left lung base. This may represent residua of empyema and underlying atelectasis or infiltrate. The gas containing component of empyema noted on recent chest x-ray and prior CT are not well delineated on the present exam. Endotracheal tube tip 3.4 cm above the carina. Left central line tip distal superior vena cava level. Nasogastric tube courses below the diaphragm. Tip is not included on the present exam. Heart size top-normal slightly enlarged.  Calcified aorta. IMPRESSION: 1. Left-sided chest tube is in place. Left chest wall subcutaneous emphysema once again noted. 2. Questionable tiny left apical pneumothorax versus overlapping structures. 3. Persistent consolidation left lung base. This may represent residua of empyema and underlying atelectasis or infiltrate. The gas containing component of empyema noted on recent chest x-ray and prior CT are not well delineated on the present exam. Electronically Signed   By: Genia Del M.D.   On: 03/08/2018 07:18   Dg Chest Port 1 View  Result Date: 03/06/2018 CLINICAL DATA:  Empyema with [place] EXAM: PORTABLE CHEST 1 VIEW COMPARISON:  05/13/2009  FINDINGS: endotracheal tube, NG tube, and central venous line unchanged. LEFT chest tube in place  with LEFT basilar atelectasis and consolidation. Shallow pneumothorax along the lateral chest wall adjacent to the along the tract of the chest tube. Small amount subpulmonic gas additionally. Extensive subcutaneous gas along the LEFT chest wall associated with chest tube. RIGHT lung is clear. IMPRESSION: 1. Small pneumothorax in the lateral LEFT hemithorax adjacent to the chest tube. 2. LEFT basilar atelectasis and consolidation. 3. Stable support apparatus. Electronically Signed   By: Suzy Bouchard M.D.   On: 03/06/2018 08:23   Dg Chest Port 1 View  Result Date: 03/05/2018 CLINICAL DATA:  ET tube and chest tube present, respiratory failure. EXAM: PORTABLE CHEST 1 VIEW COMPARISON:  03/04/2018. FINDINGS: Support tubes and lines remain stable. LEFT chest tube redemonstrated, with slight improved basilar, apical, and lateral pneumothorax. Overall pneumothorax estimated 10% or prep slightly greater. RIGHT lung clear. RIGHT base volume loss with effusion. Cardiomediastinal silhouette unchanged. Subcutaneous air is increased. IMPRESSION: Slight decrease LEFT pneumothorax, with significant residual components throughout the hemithorax. Increased subcutaneous air. Electronically Signed   By: Staci Righter M.D.   On: 03/05/2018 09:10   Dg Chest Port 1 View  Result Date: 03/04/2018 CLINICAL DATA:  Endotracheal tube, nasogastric tube and chest tube placement. EXAM: PORTABLE CHEST 1 VIEW COMPARISON:  Chest x-ray from earlier same day. FINDINGS: LEFT-sided chest tube in place. Improved aeration of the LEFT hemithorax. Persistent pneumothorax at the LEFT lung base, moderate in size, with associated overlying airspace collapse. Small component of the pneumothorax seen at the LEFT lung apex. Dense opacity overlying the LEFT heart border is also compatible with atelectasis. RIGHT lung is clear. Heart size and mediastinal  contours are within normal limits. Endotracheal tube appears well positioned with tip approximately 3 cm above the carina. Enteric tube passes below the diaphragm. LEFT subclavian central line appears well positioned with tip at the level of the lower SVC/cavoatrial junction. IMPRESSION: 1. Support apparatus appears appropriately positioned, as detailed above. 2. Significantly improved aeration of the LEFT hemithorax status post LEFT-sided chest tube placement. Persistent pneumothorax on the LEFT, moderate in size, main component at the LEFT lung base with associated perihilar airspace collapse. Electronically Signed   By: Franki Cabot M.D.   On: 03/04/2018 17:26   Dg Chest Port 1 View  Result Date: 03/04/2018 CLINICAL DATA:  67 year old female with altered mental status, not responding. EXAM: PORTABLE CHEST 1 VIEW COMPARISON:  Portable chest 02/22/2018. FINDINGS: Portable AP upright view at 0854 hours. Near complete opacification of the left hemithorax where as only mild left lung base opacity was present 10 days ago. No shift of the mediastinum. The right lung remains clear allowing for portable technique. Visualized tracheal air column is within normal limits. No pneumothorax. Paucity bowel gas in the upper abdomen. No acute osseous abnormality identified. IMPRESSION: New subtotal opacification of the left hemithorax is nonspecific but suspicious for combination of left lung consolidation and pleural effusion. The right lung remains clear. Electronically Signed   By: Genevie Ann M.D.   On: 03/04/2018 09:41   Dg Chest Port 1 View  Result Date: 02/22/2018 CLINICAL DATA:  Leukocytosis. EXAM: PORTABLE CHEST 1 VIEW COMPARISON:  None. FINDINGS: Subtle asymmetric prominence of the LEFT perihilar shadow, suspicious for perihilar pneumonia and/or lymphadenopathy. Subtle opacity at the LEFT lung base, compatible with pneumonia or mild atelectasis. RIGHT lung is clear. Osseous structures about the chest are  unremarkable. IMPRESSION: 1. Suspected LEFT perihilar pneumonia and/or lymphadenopathy. Consider chest CT for confirmation. 2. Subtle opacity at the LEFT lung base which could represent  pneumonia, atelectasis and/or small pleural effusion. Electronically Signed   By: Franki Cabot M.D.   On: 02/22/2018 16:10   Dg Swallowing Func-speech Pathology  Result Date: 03/12/2018 Objective Swallowing Evaluation: Type of Study: MBS-Modified Barium Swallow Study  Patient Details Name: Sury E Avila MRN: [id number] Date of Birth: 1951-03-09 Today's Date: 03/12/2018 Time: SLP Start Time (ACUTE ONLY): 1629 -SLP Stop Time (ACUTE ONLY): 1655 SLP Time Calculation (min) (ACUTE ONLY): 26 min Past Medical History: Past Medical History: Diagnosis Date . Colitis  . Diabetes mellitus without complication ([place])  . Hypertension  . Malnutrition ([place])  Past Surgical History: Past Surgical History: Procedure Laterality Date . BIOPSY  02/26/2018  Procedure: BIOPSY;  Surgeon: Wilford Corner, MD;  Location: [facility];  Service: Endoscopy;; . COLONOSCOPY WITH PROPOFOL N/A 02/26/2018  Procedure: COLONOSCOPY WITH PROPOFOL;  Surgeon: Wilford Corner, MD;  Location: [facility];  Service: Endoscopy;  Laterality: N/A; . ESOPHAGOGASTRODUODENOSCOPY (EGD) WITH PROPOFOL N/A 02/26/2018  Procedure: ESOPHAGOGASTRODUODENOSCOPY (EGD) WITH PROPOFOL;  Surgeon: Wilford Corner, MD;  Location: [facility];  Service: Endoscopy;  Laterality: N/A; . NO PAST SURGERIES    UNCERTAIN OF NAME OF SURGERY HPI: Pt is a 67 year old admitted with respiratory failure, left lung empyema s/p CT placement 10/31, and septic shock. ETT 10/31-11/4. MRI 11/6 showed multifocal, bilateral acute ischemia with largest infarction in the L parietal lobe. Additional areas include the L frontal lobe, R parietal lobe, and pons. PMH: malnutrition and unexplained FTT (s/p recent GI and psych consults), HTN, DM  Subjective: pt alert but confused Assessment / Plan / Recommendation [facility]  IP CLINICAL IMPRESSIONS 03/12/2018 Clinical Impression Pt has a mild-moderate oral dysphagia that is felt to be at least in part due to cognitive status. She has reduced bolus cohesion, slow posterior transit, premature spillage particularly wtih thin liquids, and lingual residue. Occasional oral holding was noted, requiring only Min cues from SLP. With extra time and use of additional swallows she is able to sufficiently clear her oral cavity, but it is cumbersome. Her pharyngeal phase is timely and strong with good airway protection and clearance. Recommend starting Dys 1 diet and thin liquids with full supervision. SLP to f/u clinically for readiness to advance solids. Will also f/u for completion of speech/language evaluation. SLP Visit Diagnosis Dysphagia, oral phase (R13.11) Attention and concentration deficit following -- Frontal lobe and executive function deficit following -- Impact on safety and function Mild aspiration risk   CH[facility] IP TREATMENT RECOMMENDATION 03/12/2018 Treatment Recommendations Therapy as outlined in treatment plan below   Prognosis 03/12/2018 Prognosis for Safe Diet Advancement Good Barriers to Reach Goals Cognitive deficits Barriers/Prognosis Comment -- [facility] IP DIET RECOMMENDATION 03/12/2018 SLP Diet Recommendations Dysphagia 1 (Puree) solids;Thin liquid Liquid Administration via Straw;Cup Medication Administration Crushed with puree Compensations Slow rate;Small sips/bites Postural Changes Seated upright at 90 degrees   CHL IP OTHER RECOMMENDATIONS 03/12/2018 Recommended Consults -- Oral Care Recommendations Oral care BID Other Recommendations Have oral suction available   CH[facility] IP FOLLOW UP RECOMMENDATIONS 03/12/2018 Follow up Recommendations Skilled Nursing facility   [facility] IP FREQUENCY AND DURATION 03/12/2018 Speech Therapy Frequency (ACUTE ONLY) min 2x/week Treatment Duration 2 weeks      CHL IP ORAL PHASE 03/12/2018 Oral Phase Impaired Oral - Pudding Teaspoon -- Oral - Pudding Cup -- Oral -  Honey Teaspoon -- Oral - Honey Cup -- Oral - Nectar Teaspoon -- Oral - Nectar Cup -- Oral - Nectar Straw -- Oral - Thin Teaspoon -- Oral - Thin Cup Holding of bolus;Lingual/palatal residue;Delayed oral  transit;Decreased bolus cohesion;Premature spillage Oral - Thin Straw Holding of bolus;Lingual/palatal residue;Delayed oral transit;Decreased bolus cohesion;Premature spillage Oral - Puree Holding of bolus;Lingual/palatal residue;Delayed oral transit;Decreased bolus cohesion;Premature spillage Oral - Mech Soft -- Oral - Regular -- Oral - Multi-Consistency -- Oral - Pill -- Oral Phase - Comment --  CH[facility] IP PHARYNGEAL PHASE 03/12/2018 Pharyngeal Phase WFL Pharyngeal- Pudding Teaspoon -- Pharyngeal -- Pharyngeal- Pudding Cup -- Pharyngeal -- Pharyngeal- Honey Teaspoon -- Pharyngeal -- Pharyngeal- Honey Cup -- Pharyngeal -- Pharyngeal- Nectar Teaspoon -- Pharyngeal -- Pharyngeal- Nectar Cup -- Pharyngeal -- Pharyngeal- Nectar Straw -- Pharyngeal -- Pharyngeal- Thin Teaspoon -- Pharyngeal -- Pharyngeal- Thin Cup -- Pharyngeal -- Pharyngeal- Thin Straw -- Pharyngeal -- Pharyngeal- Puree -- Pharyngeal -- Pharyngeal- Mechanical Soft -- Pharyngeal -- Pharyngeal- Regular -- Pharyngeal -- Pharyngeal- Multi-consistency -- Pharyngeal -- Pharyngeal- Pill -- Pharyngeal -- Pharyngeal Comment --  CH[facility] IP CERVICAL ESOPHAGEAL PHASE 03/12/2018 Cervical Esophageal Phase WFL Pudding Teaspoon -- Pudding Cup -- Honey Teaspoon -- Honey Cup -- Nectar Teaspoon -- Nectar Cup -- Nectar Straw -- Thin Teaspoon -- Thin Cup -- Thin Straw -- Puree -- Mechanical Soft -- Regular -- Multi-consistency -- Pill -- Cervical Esophageal Comment -- Germain Osgood 03/12/2018, 5:23 PM  Germain Osgood, M.A. CCC-SLP Acute Rehabilitation Services Pager [phone] Office [phone]             Vas [place] Transcranial Doppler W Bubbles  Result Date: 03/11/2018  Transcranial Doppler with Bubble Indications: Stroke. Performing Technologist: Landry Mellow RDMS, RVT   Examination Guidelines: A complete evaluation includes B-mode imaging, spectral Doppler, color Doppler, and power Doppler as needed of all accessible portions of each vessel. Bilateral testing is considered an integral part of a complete examination. Limited examinations for reoccurring indications may be performed as noted.  Summary:  A vascular evaluation was performed. The right middle cerebral artery was studied. An IV was inserted into the patient's right upper arm PICC line. Verbal informed consent was obtained.  Limited study due to poor patient cooperation, unable to perform Valsalva. No HITS heard at rest. No evidence of PFO with this test. Negative TCD Bubble study *See table(s) above for measurements and observations.  Diagnosing physician: Antony Contras MD Electronically signed by Antony Contras MD on 03/11/2018 at 3:51:31 PM.    Final    Vas [place] Lower Extremity Venous (dvt)  Result Date: 03/10/2018  Lower Venous Study Indications: Stroke, and embolic stroke.  Limitations: Body habitus, bandages, line and adjecent arterial cacification shadowing,severe edema and immobility. Performing Technologist: Lorina Rabon  Examination Guidelines: A complete evaluation includes B-mode imaging, spectral Doppler, color Doppler, and power Doppler as needed of all accessible portions of each vessel. Bilateral testing is considered an integral part of a complete examination. Limited examinations for reoccurring indications may be performed as noted.  Right Venous Findings: +---------+---------------+---------+-----------+----------+-------------------+          CompressibilityPhasicitySpontaneityPropertiesSummary             +---------+---------------+---------+-----------+----------+-------------------+ CFV      Full           Yes      Yes                                      +---------+---------------+---------+-----------+----------+-------------------+ SFJ      Full                                                              +---------+---------------+---------+-----------+----------+-------------------+  FV Prox  Full                                                             +---------+---------------+---------+-----------+----------+-------------------+ FV Mid   Full                                                             +---------+---------------+---------+-----------+----------+-------------------+ FV DistalFull                                                             +---------+---------------+---------+-----------+----------+-------------------+ PFV      Full                                                             +---------+---------------+---------+-----------+----------+-------------------+ POP      Full           Yes      Yes                                      +---------+---------------+---------+-----------+----------+-------------------+ PTV      None                    No                   Acute               +---------+---------------+---------+-----------+----------+-------------------+ PERO                                                  not well visualized +---------+---------------+---------+-----------+----------+-------------------+  Left Venous Findings: +---------+---------------+---------+-----------+----------+-------------------+          CompressibilityPhasicitySpontaneityPropertiesSummary             +---------+---------------+---------+-----------+----------+-------------------+ CFV      Full           Yes      Yes                                      +---------+---------------+---------+-----------+----------+-------------------+ SFJ      Full                                                             +---------+---------------+---------+-----------+----------+-------------------+ FV Prox  Full                                                              +---------+---------------+---------+-----------+----------+-------------------+ FV Mid   Full                                                             +---------+---------------+---------+-----------+----------+-------------------+ FV DistalFull                                                             +---------+---------------+---------+-----------+----------+-------------------+ PFV      Full                                                             +---------+---------------+---------+-----------+----------+-------------------+ POP      Full           Yes      Yes                                      +---------+---------------+---------+-----------+----------+-------------------+ PTV                                                   not well visualized +---------+---------------+---------+-----------+----------+-------------------+ PERO     None                    No                   Acute               +---------+---------------+---------+-----------+----------+-------------------+    Summary: Right: Findings consistent with acute deep vein thrombosis involving the right posterior tibial vein. No cystic structure found in the popliteal fossa. Left: Findings consistent with acute deep vein thrombosis involving the left peroneal vein. No cystic structure found in the popliteal fossa.  *See table(s) above for measurements and observations. Electronically signed by Curt Jews MD on 03/10/2018 at 2:11:56 PM.    Final    [place] Ekg Site Rite  Result Date: 03/12/2018 If Site Rite image not attached, placement could not be confirmed due to current cardiac rhythm.  Dg Esophagus W/water Sol Cm  Result Date: 03/12/2018 CLINICAL DATA:  67 year old female with clinical concern for possible esophageal perforation. EXAM: ESOPHOGRAM/BARIUM SWALLOW TECHNIQUE: Single contrast examination was performed using thin barium or water soluble. FLUOROSCOPY TIME:  Fluoroscopy Time:  1  minutes and 24 seconds Radiation Exposure Index (if provided by the fluoroscopic device): 16.5 mGy COMPARISON:  None. FINDINGS: Limited single  contrast esophagram with water-soluble contrast material (Omnipaque 300) performed. Failure to fully propagate any normal primary peristaltic waves. Extensive tertiary contractions. No gross evidence of esophageal mass, stricture or esophageal ring. No hiatal hernia. No extravasation of contrast material from the lumen of the esophagus at any time during the examination. IMPRESSION: 1. No findings to suggest esophageal perforation. 2. Nonspecific esophageal motility disorder with severe tertiary contractions. Electronically Signed   By: Vinnie Langton M.D.   On: 03/12/2018 10:55     Phillips Climes M.D on 03/16/2018 at 1:32 PM  Between 7am to 7pm - Pager - [phone]  After 7pm go to [email or web address] - password [facility]  [facility] -  Office  [phone]

## 2018-03-17 ENCOUNTER — Inpatient Hospital Stay (HOSPITAL_COMMUNITY)
Admission: RE | Admit: 2018-03-17 | Discharge: 2018-04-09 | DRG: 056 | Disposition: A | Discharge status: Home Health | Payer: Medicare Other | Source: Intra-hospital | Attending: Physical Medicine & Rehabilitation | Admitting: Physical Medicine & Rehabilitation

## 2018-03-17 ENCOUNTER — Encounter (HOSPITAL_COMMUNITY): Payer: Self-pay | Admitting: Nurse Practitioner

## 2018-03-17 ENCOUNTER — Other Ambulatory Visit: Payer: Self-pay

## 2018-03-17 ENCOUNTER — Encounter (HOSPITAL_COMMUNITY): Payer: Self-pay | Admitting: Physical Medicine and Rehabilitation

## 2018-03-17 DIAGNOSIS — Z87891 Personal history of nicotine dependence: Secondary | ICD-10-CM | POA: Diagnosis not present

## 2018-03-17 DIAGNOSIS — I6340 Cerebral infarction due to embolism of unspecified cerebral artery: Secondary | ICD-10-CM | POA: Diagnosis not present

## 2018-03-17 DIAGNOSIS — Z888 Allergy status to other drugs, medicaments and biological substances status: Secondary | ICD-10-CM | POA: Diagnosis not present

## 2018-03-17 DIAGNOSIS — I82441 Acute embolism and thrombosis of right tibial vein: Secondary | ICD-10-CM | POA: Diagnosis not present

## 2018-03-17 DIAGNOSIS — I639 Cerebral infarction, unspecified: Secondary | ICD-10-CM | POA: Insufficient documentation

## 2018-03-17 DIAGNOSIS — I6310 Cerebral infarction due to embolism of unspecified precerebral artery: Secondary | ICD-10-CM | POA: Diagnosis not present

## 2018-03-17 DIAGNOSIS — Z820 Family history of epilepsy and other diseases of the nervous system: Secondary | ICD-10-CM

## 2018-03-17 DIAGNOSIS — L89154 Pressure ulcer of sacral region, stage 4: Secondary | ICD-10-CM

## 2018-03-17 DIAGNOSIS — L89303 Pressure ulcer of unspecified buttock, stage 3: Secondary | ICD-10-CM | POA: Diagnosis not present

## 2018-03-17 DIAGNOSIS — J869 Pyothorax without fistula: Secondary | ICD-10-CM | POA: Diagnosis present

## 2018-03-17 DIAGNOSIS — D62 Acute posthemorrhagic anemia: Secondary | ICD-10-CM | POA: Diagnosis not present

## 2018-03-17 DIAGNOSIS — E119 Type 2 diabetes mellitus without complications: Secondary | ICD-10-CM | POA: Diagnosis present

## 2018-03-17 DIAGNOSIS — E876 Hypokalemia: Secondary | ICD-10-CM

## 2018-03-17 DIAGNOSIS — D72829 Elevated white blood cell count, unspecified: Secondary | ICD-10-CM

## 2018-03-17 DIAGNOSIS — Z7982 Long term (current) use of aspirin: Secondary | ICD-10-CM | POA: Diagnosis not present

## 2018-03-17 DIAGNOSIS — Z800 Family history of malignant neoplasm of digestive organs: Secondary | ICD-10-CM

## 2018-03-17 DIAGNOSIS — I69319 Unspecified symptoms and signs involving cognitive functions following cerebral infarction: Secondary | ICD-10-CM | POA: Diagnosis not present

## 2018-03-17 DIAGNOSIS — R451 Restlessness and agitation: Secondary | ICD-10-CM | POA: Diagnosis not present

## 2018-03-17 DIAGNOSIS — I10 Essential (primary) hypertension: Secondary | ICD-10-CM | POA: Diagnosis present

## 2018-03-17 DIAGNOSIS — Z6820 Body mass index (BMI) 20.0-20.9, adult: Secondary | ICD-10-CM | POA: Diagnosis not present

## 2018-03-17 DIAGNOSIS — R4587 Impulsiveness: Secondary | ICD-10-CM | POA: Diagnosis not present

## 2018-03-17 DIAGNOSIS — F1010 Alcohol abuse, uncomplicated: Secondary | ICD-10-CM

## 2018-03-17 DIAGNOSIS — R339 Retention of urine, unspecified: Secondary | ICD-10-CM | POA: Diagnosis not present

## 2018-03-17 DIAGNOSIS — R4189 Other symptoms and signs involving cognitive functions and awareness: Secondary | ICD-10-CM | POA: Diagnosis present

## 2018-03-17 DIAGNOSIS — I63413 Cerebral infarction due to embolism of bilateral middle cerebral arteries: Secondary | ICD-10-CM | POA: Diagnosis not present

## 2018-03-17 DIAGNOSIS — E43 Unspecified severe protein-calorie malnutrition: Secondary | ICD-10-CM | POA: Diagnosis present

## 2018-03-17 DIAGNOSIS — D709 Neutropenia, unspecified: Secondary | ICD-10-CM | POA: Diagnosis present

## 2018-03-17 DIAGNOSIS — I69351 Hemiplegia and hemiparesis following cerebral infarction affecting right dominant side: Principal | ICD-10-CM

## 2018-03-17 DIAGNOSIS — I63133 Cerebral infarction due to embolism of bilateral carotid arteries: Secondary | ICD-10-CM | POA: Diagnosis not present

## 2018-03-17 DIAGNOSIS — I82452 Acute embolism and thrombosis of left peroneal vein: Secondary | ICD-10-CM | POA: Diagnosis present

## 2018-03-17 DIAGNOSIS — I63412 Cerebral infarction due to embolism of left middle cerebral artery: Secondary | ICD-10-CM | POA: Diagnosis not present

## 2018-03-17 DIAGNOSIS — R197 Diarrhea, unspecified: Secondary | ICD-10-CM | POA: Diagnosis present

## 2018-03-17 DIAGNOSIS — I6349 Cerebral infarction due to embolism of other cerebral artery: Secondary | ICD-10-CM | POA: Diagnosis not present

## 2018-03-17 DIAGNOSIS — R1311 Dysphagia, oral phase: Secondary | ICD-10-CM | POA: Diagnosis present

## 2018-03-17 DIAGNOSIS — R569 Unspecified convulsions: Secondary | ICD-10-CM | POA: Diagnosis not present

## 2018-03-17 DIAGNOSIS — Z298 Encounter for other specified prophylactic measures: Secondary | ICD-10-CM

## 2018-03-17 DIAGNOSIS — Z2989 Encounter for other specified prophylactic measures: Secondary | ICD-10-CM

## 2018-03-17 DIAGNOSIS — I824Z3 Acute embolism and thrombosis of unspecified deep veins of distal lower extremity, bilateral: Secondary | ICD-10-CM | POA: Diagnosis present

## 2018-03-17 DIAGNOSIS — D649 Anemia, unspecified: Secondary | ICD-10-CM | POA: Diagnosis present

## 2018-03-17 LAB — GLUCOSE, CAPILLARY
GLUCOSE-CAPILLARY: 63 mg/dL — AB (ref 70–99)
Glucose-Capillary: 75 mg/dL (ref 70–99)
Glucose-Capillary: 81 mg/dL (ref 70–99)

## 2018-03-17 MED ORDER — PRO-STAT SUGAR FREE PO LIQD
30.0000 mL | Freq: Two times a day (BID) | ORAL | Status: DC
  Administered 2018-03-17 – 2018-04-09 (×45): 30 mL via ORAL
  Filled 2018-03-17 (×47): qty 30

## 2018-03-17 MED ORDER — LACTINEX PO CHEW: 1.0000 | CHEWABLE_TABLET | Freq: Three times a day (TID) | ORAL | Status: DC

## 2018-03-17 MED ORDER — GERHARDT'S BUTT CREAM
TOPICAL_CREAM | Freq: Every day | CUTANEOUS | Status: DC
  Administered 2018-03-18 – 2018-04-06 (×20): via TOPICAL
  Administered 2018-04-07: 1 via TOPICAL
  Administered 2018-04-08 – 2018-04-09 (×2): via TOPICAL
  Filled 2018-03-17 (×4): qty 1

## 2018-03-17 MED ORDER — COLLAGENASE 250 UNIT/GM EX OINT: TOPICAL_OINTMENT | Freq: Every day | CUTANEOUS | 0 refills | Status: DC

## 2018-03-17 MED ORDER — VITAMIN B-1 100 MG PO TABS
100.0000 mg | ORAL_TABLET | Freq: Every day | ORAL | Status: DC
  Administered 2018-03-17 – 2018-04-09 (×24): 100 mg via ORAL
  Filled 2018-03-17 (×25): qty 1

## 2018-03-17 MED ORDER — POLYETHYLENE GLYCOL 3350 17 G PO PACK: 17.0000 g | PACK | Freq: Every day | ORAL | Status: DC | PRN

## 2018-03-17 MED ORDER — FLEET ENEMA 7-19 GM/118ML RE ENEM: 1.0000 | ENEMA | Freq: Once | RECTAL | Status: DC | PRN

## 2018-03-17 MED ORDER — ONDANSETRON 4 MG PO TBDP
4.0000 mg | ORAL_TABLET | Freq: Four times a day (QID) | ORAL | Status: DC | PRN
  Administered 2018-03-20 – 2018-03-21 (×2): 4 mg via ORAL
  Filled 2018-03-17 (×3): qty 1

## 2018-03-17 MED ORDER — ENSURE ENLIVE PO LIQD
237.0000 mL | Freq: Three times a day (TID) | ORAL | Status: DC
  Administered 2018-03-18 (×2): 237 mL via ORAL

## 2018-03-17 MED ORDER — COLLAGENASE 250 UNIT/GM EX OINT
TOPICAL_OINTMENT | Freq: Every day | CUTANEOUS | Status: DC
  Administered 2018-03-18 – 2018-04-04 (×17): via TOPICAL
  Administered 2018-04-05: 1 via TOPICAL
  Administered 2018-04-06 – 2018-04-09 (×4): via TOPICAL
  Filled 2018-03-17 (×5): qty 30

## 2018-03-17 MED ORDER — APIXABAN 5 MG PO TABS
10.0000 mg | ORAL_TABLET | Freq: Two times a day (BID) | ORAL | Status: AC
  Administered 2018-03-17 – 2018-03-19 (×5): 10 mg via ORAL
  Filled 2018-03-17 (×5): qty 2

## 2018-03-17 MED ORDER — BISACODYL 10 MG RE SUPP: 10.0000 mg | Freq: Every day | RECTAL | Status: DC | PRN

## 2018-03-17 MED ORDER — ALUM & MAG HYDROXIDE-SIMETH 200-200-20 MG/5ML PO SUSP: 30.0000 mL | ORAL | Status: DC | PRN

## 2018-03-17 MED ORDER — APIXABAN 5 MG PO TABS
5.0000 mg | ORAL_TABLET | Freq: Two times a day (BID) | ORAL | Status: DC
  Administered 2018-03-20 – 2018-04-09 (×41): 5 mg via ORAL
  Filled 2018-03-17 (×37): qty 1
  Filled 2018-03-17: qty 2
  Filled 2018-03-17 (×4): qty 1

## 2018-03-17 MED ORDER — APIXABAN 5 MG PO TABS: ORAL_TABLET | ORAL | Status: DC

## 2018-03-17 MED ORDER — PANTOPRAZOLE SODIUM 40 MG IV SOLR
40.0000 mg | INTRAVENOUS | Status: DC
  Administered 2018-03-17: 40 mg via INTRAVENOUS
  Filled 2018-03-17: qty 40

## 2018-03-17 MED ORDER — LEVOFLOXACIN 500 MG PO TABS
750.0000 mg | ORAL_TABLET | Freq: Every day | ORAL | Status: DC
  Administered 2018-03-18 – 2018-03-22 (×5): 750 mg via ORAL
  Filled 2018-03-17 (×5): qty 2

## 2018-03-17 MED ORDER — LEVOFLOXACIN 750 MG PO TABS: 750.0000 mg | ORAL_TABLET | Freq: Every day | ORAL | Status: DC

## 2018-03-17 MED ORDER — BISACODYL 10 MG RE SUPP: 10.0000 mg | Freq: Every day | RECTAL | 0 refills | Status: DC | PRN

## 2018-03-17 MED ORDER — DIPHENHYDRAMINE HCL 12.5 MG/5ML PO ELIX: 12.5000 mg | ORAL_SOLUTION | Freq: Four times a day (QID) | ORAL | Status: DC | PRN

## 2018-03-17 MED ORDER — POTASSIUM CHLORIDE CRYS ER 20 MEQ PO TBCR
20.0000 meq | EXTENDED_RELEASE_TABLET | Freq: Two times a day (BID) | ORAL | Status: DC
  Administered 2018-03-17 – 2018-04-09 (×45): 20 meq via ORAL
  Filled 2018-03-17 (×15): qty 1
  Filled 2018-03-17: qty 2
  Filled 2018-03-17 (×31): qty 1

## 2018-03-17 MED ORDER — ACETAMINOPHEN 325 MG PO TABS
325.0000 mg | ORAL_TABLET | ORAL | Status: DC | PRN
  Administered 2018-03-18 – 2018-04-08 (×25): 650 mg via ORAL
  Filled 2018-03-17 (×25): qty 2

## 2018-03-17 MED ORDER — GUAIFENESIN-DM 100-10 MG/5ML PO SYRP: 5.0000 mL | ORAL_SOLUTION | Freq: Four times a day (QID) | ORAL | Status: DC | PRN

## 2018-03-17 MED ORDER — TRAZODONE HCL 50 MG PO TABS
25.0000 mg | ORAL_TABLET | Freq: Every evening | ORAL | Status: DC | PRN
  Administered 2018-03-17: 50 mg via ORAL
  Administered 2018-03-18: 25 mg via ORAL
  Administered 2018-03-20 – 2018-03-21 (×2): 50 mg via ORAL
  Filled 2018-03-17 (×5): qty 1

## 2018-03-17 MED ORDER — FLORANEX PO PACK
1.0000 g | PACK | Freq: Three times a day (TID) | ORAL | Status: DC
  Administered 2018-03-17 – 2018-04-09 (×65): 1 g via ORAL
  Filled 2018-03-17 (×70): qty 1

## 2018-03-17 MED ORDER — CALCIUM POLYCARBOPHIL 625 MG PO TABS
625.0000 mg | ORAL_TABLET | Freq: Two times a day (BID) | ORAL | Status: DC
  Administered 2018-03-17 – 2018-03-25 (×16): 625 mg via ORAL
  Filled 2018-03-17 (×16): qty 1

## 2018-03-17 NOTE — Progress Notes (Signed)
PMR Admission Coordinator Pre-Admission Assessment  Patient: Maria Avila is an 67 y.o., female MRN: [id number] DOB: 1950/05/28 Height: 5\' 2"  (157.5 cm) Weight: (scale not working)                                                                                                                                                  Forensic scientist HMO: Yes    PPO:      PCP:      IPA:      80/20:      OTHER:  PRIMARY: UHC Medicare      Policy#: [id number]      Subscriber: Patient CM Name: [name]      Phone#: [phone]     Fax#: [phone] Pre-Cert#: [id number]      Employer:  Josem Kaufmann provided by Abigail Butts at [facility] on 03/16/18; Okay to admit 03/17/18. Clinical updates due to [name] on day 7 from approval date (due 03/22/18).  Benefits:  Phone #: NA     Name: [email or web address] Eff. Date: 05/05/17     Deduct: $0      Out of Pocket Max: $4,400 ($1,211.29)      Life Max: NA CIR: $345/day for days 1-5; $0/day for days 6+      SNF: $0/day for days 1-20; $160/day for days 21-48, $0/day for days 49-100 (100 day limit) Outpatient: per necessity   Co-Pay: $40/visit Home Health: 100%, per necessity      Co-Pay:  DME: 80%     Co-Pay: 20% Providers:  SECONDARY:       Policy#:       Subscriber:  CM Name:       Phone#:      Fax#:  Pre-Cert#:       Employer:  Benefits:  Phone #:      Name:  Eff. Date:      Deduct:       Out of Pocket Max:       Life Max:  CIR:       SNF:  Outpatient:      Co-Pay:  Home Health:       Co-Pay:  DME:      Co-Pay:   Medicaid Application Date:       Case Manager:  Disability Application Date:       Case Worker:   Emergency Contact Information         Contact Information    Name Relation Home Work Mobile   [place] R Spouse [phone]     Sonia Baller Daughter   [phone]   Odis Luster   [phone]     Current Medical History  Patient Admitting Diagnosis: left parietal lobewith multi-medical issues  History of Present Illness: Maria Avila a 67 y.o.femalewith history fo T2DM, HTN, colitis with6-12 monthshx of N/V, left sided abdominal painwith  progressive anorexia who was admitted on 02/16/18, underwentextensive negative workup and discharged to SNF for rehab.History taken from chart review.She was readmitted on 03/04/18 with mental status changes and hypoxemia with tachycardia. She was found to be septic with near complete opacification of lef chest due to empyema. She was intubated, left chest tube placed with purulent drainage and was started on IV antibiotics. Dr. Roxan Hockey was consulted for input and recommended gastrograffin swallow to rule of esophageal perforation and felt that empyema likely due to aspiration PNA. follow up CXR showed incomplete reexpansion and treated with tPA via chest tube for thrombolysis. She was started on TPN for nutritional support. Pleural fluid positive for E coli, enterococcus and pseudomonas aeruginosa and antibiotics narrowed to unasyn. She was extubated on 11/5 and found to be lethargic with right sided weakness with left gaze preference and mumbling speech. Neurology consulted for input and CT perfusion done revealing left parietal lobe subacute infarct with pseudonormalized perfusion. CTA head/neck was negative for large vessel occlusion.   BLE dopplers were positive for acute DVT in right posterior tibial veins and left peroneal veins. she was started on IV heparin for treatment. TCD negative for HITs and no evidence of PFO. Dr. Erlinda Hong felt that stroke was embolic due to unknown source--endocarditis v/s hypercoagulable state due to sepsis. CT tube removed on 11/9 as nonfunctional with recommendations to follow clinically for recurrence or respiratory deterioration. Has been transitioned to Xarelto and on dysphagia 1 diet, thins. Therapy evaluations done revealing strong posterior lean when at EOB with visiospatial deficits, global weakness with tachycardia with activity and moderate  cognitive linguistic deficits. CIR recommended by rehab team. Pt is to be admitted to CIR on 03/17/18.   Complete NIHSS TOTAL: 21  Past Medical History      Past Medical History:  Diagnosis Date  . Colitis   . Diabetes mellitus without complication ([place])   . Hypertension   . Malnutrition ([place])     Family History  family history includes Alzheimer's disease in her mother; Colon cancer in her father.  Prior Rehab/Hospitalizations:  Has the patient had major surgery during 100 days prior to admission? Yes  Current Medications   Current Facility-Administered Medications:  .  0.9 %  sodium chloride infusion, 250 mL, Intravenous, Continuous, [name], MD, Stopped at 03/04/18 2139 .  0.9 %  sodium chloride infusion, , Intravenous, Continuous, [name], MD, Last Rate: 75 mL/hr at 03/16/18 1345 .  apixaban (ELIQUIS) tablet 10 mg, 10 mg, Oral, BID, 10 mg at 03/17/18 0944 **FOLLOWED BY** [START ON 03/20/2018] apixaban (ELIQUIS) tablet 5 mg, 5 mg, Oral, BID, [name], RPH .  bisacodyl (DULCOLAX) suppository 10 mg, 10 mg, Rectal, Daily PRN, [name], MD, 10 mg at 03/07/18 1100 .  chlorhexidine (PERIDEX) 0.12 % solution 15 mL, 15 mL, Mouth Rinse, BID, [name], MD, 15 mL at 03/17/18 0944 .  Chlorhexidine Gluconate Cloth 2 % PADS 6 each, 6 each, Topical, Daily, [name], MD, 6 each at 03/17/18 0944 .  collagenase (SANTYL) ointment, , Topical, Daily, Elgergawy, Silver Huguenin, MD .  feeding supplement (ENSURE ENLIVE) (ENSURE ENLIVE) liquid 237 mL, 237 mL, Oral, TID BM, Elgergawy, Silver Huguenin, MD, 237 mL at 03/16/18 1346 .  Gerhardt's butt cream, , Topical, Daily, Elgergawy, Silver Huguenin, MD .  levofloxacin (LEVAQUIN) tablet 750 mg, 750 mg, Oral, Daily, Elgergawy, Silver Huguenin, MD, 750 mg at 03/17/18 0944 .  MEDLINE mouth rinse, 15 mL, Mouth Rinse, q12n4p, [name], MD, 15 mL  at 03/16/18 1154 .  pantoprazole (PROTONIX) injection 40 mg, 40 mg,  Intravenous, Q24H, [name], MD, 40 mg at 03/16/18 1815 .  sodium chloride flush (NS) 0.9 % injection 10-40 mL, 10-40 mL, Intracatheter, Q12H, [name], MD, 10 mL at 03/17/18 0946 .  sodium chloride flush (NS) 0.9 % injection 10-40 mL, 10-40 mL, Intracatheter, PRN, [name], MD, 10 mL at 03/11/18 1710  Patients Current Diet:     Diet Order                  DIET DYS 3 Room service appropriate? 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 None of the above  Prior Functional Level Prior Function Level of Independence: Needs assistance Gait / Transfers Assistance Needed: walked with husband's help household distances ADL's / Homemaking Assistance Needed: pt needing help with showering in the days leading up to this hospitalization, but typically independent Communication / Swallowing Assistance Needed: chart reports that family has noticed some recent slurring. Pt did not verbalize much during eval but was intelligible when she did  Self Care: Did the patient need help bathing, dressing, using the toilet or eating?  Independent  Indoor Mobility: Did the patient need assistance  with walking from room to room (with or without device)? Independent  Stairs: Did the patient need assistance with internal or external stairs (with or without device)? Independent  Functional Cognition: Did the patient need help planning regular tasks such as shopping or remembering to take medications? Independent  Current Functional Level Cognition  Arousal/Alertness: Awake/alert Overall Cognitive Status: Impaired/Different from baseline Current Attention Level: Sustained Orientation Level: Oriented to person, Oriented to place, Disoriented to time, Disoriented to situation Following Commands: Follows one step commands with increased time, Follows one step commands consistently Safety/Judgement: Decreased awareness of safety, Decreased awareness of deficits General Comments: fatigues very easily. Confusion noted during session when pt stated that the lotion bottle in her hand was burning. Pt requiring increased time and cues throughout session Attention: Sustained Sustained Attention: Impaired Sustained Attention Impairment: Verbal basic Memory: Impaired Memory Impairment: Storage deficit, Retrieval deficit, Decreased recall of new information Awareness: Impaired Awareness Impairment: Intellectual impairment, Emergent impairment Problem Solving: Impaired Problem Solving Impairment: Verbal basic Safety/Judgment: Impaired    Extremity Assessment (includes Sensation/Coordination)  Upper Extremity Assessment: Generalized weakness, RUE deficits/detail, LUE deficits/detail RUE Deficits / Details: pt reports her R UE feels "different than her L," strength 2/5 shoulder,3/5 elbow, 3-/5 gross graspt  RUE Coordination: decreased fine motor, decreased gross motor LUE Deficits / Details: 2/5 shoulder, 3/5 elbow to hand LUE Coordination: decreased fine motor, decreased gross motor  Lower Extremity Assessment: Defer to PT evaluation RLE Deficits / Details: unable to formally test MMT due  to fatigue, based on functional assessment at best 3-/5  B quads  LLE Deficits / Details: unable to formally test MMT due to fatigue, based on functional assessment at best 3-/5 B quads     ADLs  Overall ADL's : Needs assistance/impaired Lower Body Bathing: Minimal assistance, Sit to/from stand, +2 for safety/equipment, Maximal assistance, +2 for physical assistance, Cueing for safety, Cueing for sequencing Lower Body Bathing Details (indicate cue type and reason): While sitting at EOB, pt applying lotion to her legs with Min A for sitting balance and to facilitate normalize movement of RUE. Max A +2 for sit<>stand with stedy. Pt able to bend forward and able to self correct and bring trunk into upright position. Requiring Min A for posterior lean with fatigue Toilet Transfer: Maximal assistance, +2 for physical assistance, Cueing for safety, Cueing for sequencing(sit<>stand with stedy) Functional mobility during ADLs: Maximal assistance, +2 for physical assistance(sit<>stand with stedy) General ADL Comments: Pt with decreased balance, strength, cognition, and activity tolerance.    Mobility  Overal bed mobility: Needs Assistance Bed Mobility: Supine to Sit Supine to sit: +2 for physical assistance, Mod assist Sit to supine: +2 for physical assistance, +2 for safety/equipment General bed mobility comments: pt able to grasp R rail with L hand and intiate LE mvmt with vc's. Needed mod A +2 at hips and trunk for getting to EOB    Transfers  Overall transfer level: Needs assistance Equipment used: None Transfer via Lift Equipment: Stedy Transfers: Sit to/from Stand, Risk manager Sit to Stand: +2 safety/equipment, Max assist Stand pivot transfers: +2 physical assistance, Total assist(stedy) General transfer comment: pt was able to grasp stedy and pull for sit to stand but still needed max A +2 for power up and getting hips fwd enough to put flaps of stedy down. Had an easier time  standing from elevated surface of stedy to sit to chair.     Ambulation / Gait / Stairs / Wheelchair Mobility  Ambulation/Gait General Gait Details:  unable due to fatigue and weakness    Posture / Balance Dynamic Sitting Balance Sitting balance - Comments: pt needing min A initially with posterior LOB but progressed to min-guard with ability to wt shift fwd to reach LE's.  Balance Overall balance assessment: Needs assistance Sitting-balance support: Bilateral upper extremity supported, Feet supported Sitting balance-Leahy Scale: Poor Sitting balance - Comments: pt needing min A initially with posterior LOB but progressed to min-guard with ability to wt shift fwd to reach LE's.  Postural control: Posterior lean Standing balance support: Bilateral upper extremity supported Standing balance-Leahy Scale: Zero Standing balance comment: max A to maintain standing position in stedy with knees supported    Special needs/care consideration BiPAP/CPAP: no CPM: no Continuous Drip IV: 0.9% sodium chloride infusion  Dialysis: No        Days: no Life Vest: no Oxygen: no Special Bed: pt currently on air bed  Trach Size: no Wound Vac (area): no      Location: no Skin: blister to right arm; moisture associated skin damage to buttocks and perineum; skin tear along distal portion of right arm, stage III pressure injury to ischial tuberosity, raw open areas to perineum.               Bowel mgmt: questionable continence, unable to assess due to flexiseal use; last BM: 03/16/18. Bladder mgmt: incontinent (use of Purwick currently) Diabetic mgmt: No     Previous Home Environment Living Arrangements: Spouse/significant other Available Help at Discharge: Family, Available 24 hours/day Type of Home: House Home Layout:  One level Home Access: Stairs to enter Entrance Stairs-Rails: None Entrance Stairs-Number of Steps: 3 Bathroom Shower/Tub: Multimedia programmer: Standard Home Care  Services: No Additional Comments: per husband  Discharge Living Setting Plans for Discharge Living Setting: Patient's home, Lives with (comment)(husband) Type of Home at Discharge: House Discharge Home Layout: One level Discharge Home Access: Stairs to enter Entrance Stairs-Rails: None Entrance Stairs-Number of Steps: 3-4 Discharge Bathroom Shower/Tub: Tub/shower unit, Walk-in shower Discharge Bathroom Toilet: Standard Discharge Bathroom Accessibility: Yes How Accessible: Accessible via walker Does the patient have any problems obtaining your medications?: No  Social/Family/Support Systems Patient Roles: Spouse, Parent(parent to grown children) Contact Information: husband [name]) -cell: [phone]; home: [phone] Anticipated Caregiver: husband and son can assist when he comes over (which is frequently) Anticipated Caregiver's Contact Information: see above Ability/Limitations of Caregiver: Min A Caregiver Availability: 24/7 Discharge Plan Discussed with Primary Caregiver: Yes(with patient and husband) Is Caregiver In Agreement with Plan?: Yes Does Caregiver/Family have Issues with Lodging/Transportation while Pt is in Rehab?: No   Goals/Additional Needs Patient/Family Goal for Rehab: PT/OT: Min A; SLP: Supervision Expected length of stay: 20-24 days Cultural Considerations: NA Dietary Needs: Dys 3, thin liquids Equipment Needs: TBD Pt/Family Agrees to Admission and willing to participate: Yes Program Orientation Provided & Reviewed with Pt/Caregiver Including Roles  & Responsibilities: Yes(with pt and her husband)  Barriers to Discharge: Home environment access/layout, Inaccessible home environment  Barriers to Discharge Comments: steps to enter home   Decrease burden of Care through IP rehab admission:    Possible need for SNF placement upon discharge: Not anticipated; pt has good social support at home and has good prognosis for further progress.     Patient Condition: This patient's condition remains as documented in the consult dated 03/15/18, in which the Rehabilitation Physician determined and documented that the patient's condition is appropriate for intensive rehabilitative care in an inpatient rehabilitation facility pending family support availability and pending activity tolerance. These areas have been addressed. Pt's husband is retired and is both able and willing to provide necessary and recommended support at DC. The patient has shown a willingness and ability to participate in therapy sessions, with progression noted in bed mobility functional status as well as sitting balance.  Will admit to inpatient rehab today.  Preadmission Screen Completed By:  Jhonnie Garner, 03/17/2018 11:52 AM ______________________________________________________________________   Discussed status with Dr. Posey Pronto on 03/17/18 at 11:53AM and received telephone approval for admission today.  Admission Coordinator:  Jhonnie Garner, time 11:53AM/Date11/13/19.             Cosigned by: Jamse Arn, MD at 03/17/2018 11:59 AM  Revision History

## 2018-03-17 NOTE — Progress Notes (Signed)
Patient ID: Maria Avila, female   DOB: May 15, 1950, 67 y.o.   MRN: [id number] Patient admitted to [phone] via bed, escorted by nursing staff and spouse.  Patient and spouse verbalized understanding of rehab process, with emphasis on fall prevention policy, personal belongings policy, and visitation policy.  Patient with purewick and rectal tube intact.  Midline infusing.  Patient appears to be in no immediate distress at this time.  Brita Romp, RN

## 2018-03-17 NOTE — Progress Notes (Signed)
Inpatient Rehabilitation-Admissions Coordinator   [facility] has received medical approval and insurance approval for admit to CIR today. AC will follow up with CM/SW and RN regarding plan.   Please call if questions.   Jhonnie Garner, OTR/L  Rehab Admissions Coordinator  [phone] 03/17/2018 11:51 AM

## 2018-03-17 NOTE — Progress Notes (Signed)
Patient discharged to be transferred to CIR.  Report called to receiving nurse.

## 2018-03-17 NOTE — Care Management Note (Signed)
Case Management Note Marvetta Gibbons RN, BSN Transitions of Care Unit 4E- RN Case Manager [phone]  Patient Details  Name: Maria Avila MRN: [id number] Date of Birth: 1950/11/04  Subjective/Objective:   Pt admitted with empyema                 Action/Plan: PTA pt was at [facility], CSW following, CIR consulted for possible admission  Expected Discharge Date:  03/17/18               Expected Discharge Plan:  [place]  In-House Referral:  Clinical Social Work  Discharge planning Services  CM Consult  Post Acute Care Choice:    Choice offered to:     DME Arranged:    DME Agency:     HH Arranged:    [place] Agency:     Status of Service:  Completed, signed off  If discussed at [employer], dates discussed:   Discharge Disposition: IP rehab    Additional Comments:  03/17/18- 24- Marvetta Gibbons RN, CM- pt has received insurance approval for IP rehab admission, bed available today- plan to transition to [facility] IP rehab later today.   Dawayne Patricia, RN 03/17/2018, 2:52 PM

## 2018-03-17 NOTE — Discharge Summary (Signed)
Maria Avila, is a 67 y.o. female  DOB April 20, 1951  MRN [id number].  Admission date:  03/04/2018  Admitting Physician  [name], MD  Discharge Date:  03/17/2018   Primary MD  [name], MD  Recommendations for primary care physician for things to follow:  -Please continue with Levaquin through 03/25/2018 -Eliquis for DVT, and dose 10 mg twice daily, can be transitioned to 5 mg twice daily 03/20/2018 -Continue with wound care -Follow with neurology after discharge from CIR -He is on dysphagia 3 thin liquid diet, will need continued follow-up by nutrition and SLP   Admission Diagnosis  Empyema ([place]) [J86.9] Sepsis with acute renal failure, due to unspecified organism, unspecified acute renal failure type, unspecified whether septic shock present ([place]) [A41.9, R65.20, N17.9]   Discharge Diagnosis  Empyema ([place]) [J86.9] Sepsis with acute renal failure, due to unspecified organism, unspecified acute renal failure type, unspecified whether septic shock present ([place]) [A41.9, R65.20, N17.9]    Active Problems:   Severe protein-calorie malnutrition ([place])   Empyema ([place])   Pressure injury of skin   Acute respiratory failure with hypercapnia (HCC)   Malnutrition of moderate degree   Cerebral embolism with cerebral infarction   Chest tube in place   Esophageal perforation   Perforation esophagus   Sepsis with acute renal failure ([place])   Acute deep vein thrombosis (DVT) of both peroneal veins   Leukocytosis   Sepsis due to Pseudomonas species (HCC)   Dysphagia, post-stroke   Benign essential HTN   Acute deep vein thrombosis (DVT) of distal vein of both lower extremities (HCC)   Sinus tachycardia      Past Medical History:  Diagnosis Date  . Colitis   . Diabetes mellitus without complication ([place])   . Hypertension   . Malnutrition ([place])     Past Surgical History:  Procedure Laterality  Date  . BIOPSY  02/26/2018   Procedure: BIOPSY;  Surgeon: Wilford Corner, MD;  Location: [place];  Service: Endoscopy;;  . COLONOSCOPY WITH PROPOFOL N/A 02/26/2018   Procedure: COLONOSCOPY WITH PROPOFOL;  Surgeon: Wilford Corner, MD;  Location: [place];  Service: Endoscopy;  Laterality: N/A;  . ESOPHAGOGASTRODUODENOSCOPY (EGD) WITH PROPOFOL N/A 02/26/2018   Procedure: ESOPHAGOGASTRODUODENOSCOPY (EGD) WITH PROPOFOL;  Surgeon: Wilford Corner, MD;  Location: [place];  Service: Endoscopy;  Laterality: N/A;  . NO PAST SURGERIES     UNCERTAIN OF NAME OF SURGERY       History of present illness and  Hospital Course:     Kindly see H&P for history of present illness and admission details, please review complete Labs, Consult reports and Test reports for all details in brief  HPI  from the history and physical done on the day of admission 03/04/2018 67 year old female presenting from [facility] for 2 days history of being less responsive than her baseline.       Recent hospitalization 10/15 - 10/26 for failure to thrive, pancytopenia, abdominal pain, and inability to tolerate oral diet.  Underwent  EGD 10/25 with findings of normal esophagus, acute gastritis, and colonoscopy 10/25 which diverticulosis throughout.   Noted to be hypoglycemic, hypoxic    Hospital Course   67 y.o.femalewith history of malnutrition, hypertension, diabetes and colitis, was admitted to ICU 03/04/2018 due to septic shock from empyema, patient with recent hospitalization letter to that significant for endoscopy with no acute findings, she remained in SNF for 4 days, then she presents with septic shock, work-up significant for empyema, requiring pressors, intubation, became off pressors, extubated, then she was noted to have moving her right side, and had left gaze preference, work-up significant for acute CVA, embolic, as well she had acute DVT, she was transferred to [facility] care 03/11/2018.  10/31 ETT placed 10/31 Chest tube placed 11/1 TPA/pulmozyme administration  11/4 extubated  Septic shock due to empyema -Present on admission, requiring pressure support, CT chest showing free air and large pleural effusion, status post chest tube 03/04/2018,. - intubated on admission 03/04/2018, extubated 03/09/2018 -Empyema culture growing Pseudomonas, E. coli, enterococcus, initially on Unasyn on Unasyn, transitioned 03/12/2018 on Zosyn, cytosis trending down, I have discussed with ID, recommendation for total of 1 days of therapy, 10 you with Zosyn during hospital stay, upon discharge she can be transitioned to Cipro and Augmentin, permissive checked, her enterococcus albumin actually sensitive to quinolones, so she will be discharged on levofloxacin 750 mg oral daily, to finish total of 21 days of treatment course, continue through 03/25/2018 -Chest tube management per PCCM, tube discontinued 03/13/2018 by pulmonary.  As there is a small residual effusion with no clear evidence of loculation, chest tube was discontinued as it is not draining, as well as nonfunctional due to being kinked. -This is most likely in the setting of aspiration pneumonia, no evidence of esophageal perforation on esophgiogram, she was started on a diet after her barium swallow evaluation. -TPN stopped 03/13/2018, and has been on diet, has been advanced by SLP, improving  Acute CVA - Left parietal small infarcts, left MCA/ACA, right parietal and pontine punctateinfarcts, -Most likely embolic pattern, cardioembolic versus DVT in the setting of PFO, versus endocarditis, versus hypercoagulable state due to severe sepsis. -on heparin for her lower extremity DVT, TCD bubble study with no PFO at rest, patient not cooperative on Valsalva test , now there is no further procedures anticipated, now on Eliquis  Acute respiratory failure -Due to empyema and septic shock on admission, requiring  intubation, currently extubated, back to room air  Bilateral lower extremity DVT -Lower extremity venous Doppler showing bilateral lower extremity DVT with right posterior tibial veins and left peroneal veins distally -TCD bubble study no PFO at rest, not cooperative on valsalva test -Now she can take oral, no further procedures are anticipated, she was switched from heparin gtt to Eliquis  Diabetes -Patient with history of diabetes mellitus, but her A1c is 5.1, never required insulin, actually her CBG on the lower side intermittently, so I have discontinued her sliding scale  Protein calorie malnutrition -Was on TPN, currently on dysphagia 3 diet, appetite is improving, encouraged to drink Ensure today  Normocytic anemia -He was transfused 1 unit PRBC for hemoglobin of 7.7, hemoglobin remained stable,    Discharge Condition:  Stable   Follow UP  Follow-up Information    Garvin Fila, MD. Schedule an appointment as soon as possible for a visit in 6 week(s).   Specialties:  Neurology, Radiology Contact information: [address] [place] [address] [phone]  Discharge Instructions  and  Discharge Medications     Discharge Instructions    Ambulatory referral to Neurology   Complete by:  As directed    Follow up with Dr. Leonie Man at [facility] in 4-6 weeks. Too complicated for RN to follow. Thanks.   Discharge instructions   Complete by:  As directed    Treatment per CIR, please follow recommendations on discharge summary     Allergies as of 03/17/2018      Reactions   Crestor [rosuvastatin Calcium] Swelling, Other (See Comments)   Swelling of legs and muscle weakness   Zocor [simvastatin] Swelling, Other (See Comments)   Swelling of legs and muscle weakness      Medication List    STOP taking these medications   aspirin EC 81 MG tablet   lisinopril 10 MG tablet Commonly known as:  PRINIVIL,ZESTRIL   mirtazapine 7.5 MG  tablet Commonly known as:  REMERON   naproxen sodium 220 MG tablet Commonly known as:  ALEVE   thiamine 100 MG tablet     TAKE these medications   apixaban 5 MG Tabs tablet Commonly known as:  ELIQUIS Continue with 10 mg oral twice daily until 03/20/2018, then transition to 5 mg twice daily   bisacodyl 10 MG suppository Commonly known as:  DULCOLAX Place 1 suppository (10 mg total) rectally daily as needed for moderate constipation or severe constipation.   collagenase ointment Commonly known as:  SANTYL Apply topically daily. Start taking on:  03/18/2018   feeding supplement (ENSURE ENLIVE) Liqd Take 237 mLs by mouth 3 (three) times daily between meals.   Fish Oil 1000 MG Caps Take 1,000 mg by mouth daily.   lactobacillus acidophilus & bulgar chewable tablet Chew 1 tablet by mouth 3 (three) times daily with meals.   levofloxacin 750 MG tablet Commonly known as:  LEVAQUIN Take 1 tablet (750 mg total) by mouth daily for 7 days. To continue through 03/25/2018 Start taking on:  03/18/2018   multivitamin capsule Take 1 capsule by mouth daily.   ondansetron 4 MG disintegrating tablet Commonly known as:  ZOFRAN-ODT Take 1 tablet (4 mg total) by mouth every 6 (six) hours as needed. What changed:  reasons to take this   Pancrelipase (Lip-Prot-Amyl) 24000-76000 units Cpep Take 1 capsule (24,000 Units total) by mouth 3 (three) times daily with meals.   pantoprazole 40 MG tablet Commonly known as:  PROTONIX Take 40 mg by mouth daily.   pravastatin 40 MG tablet Commonly known as:  PRAVACHOL Take 40 mg by mouth every evening.   promethazine 12.5 MG tablet Commonly known as:  PHENERGAN Take 12.5 mg by mouth every 6 (six) hours as needed for nausea.   Vitamin D 50 MCG (2000 UT) Caps Take 2,000 Units by mouth daily.         Diet and Activity recommendation: See Discharge Instructions above   Consults obtained -   PCCM, CT surgery, neuurology  Major  procedures and Radiology Reports - PLEASE review detailed and final reports for all details, in brief -   10/31 ETT placed 10/31 Chest tube placed, removed 03/13/2018 11/1 TPA/pulmozyme administration  11/4 extubated     Ct Angio Head W Or Wo Contrast  Result Date: 03/09/2018 CLINICAL DATA:  67 y/o F; patient extubated yesterday. Right-sided weakness and slow speech. Increased lethargy. EXAM: CT ANGIOGRAPHY HEAD AND NECK CT PERFUSION BRAIN TECHNIQUE: Multidetector CT imaging of the head and neck was performed using the standard protocol during bolus administration  of intravenous contrast. Multiplanar CT image reconstructions and MIPs were obtained to evaluate the vascular anatomy. Carotid stenosis measurements (when applicable) are obtained utilizing NASCET criteria, using the distal internal carotid diameter as the denominator. Multiphase CT imaging of the brain was performed following IV bolus contrast injection. Subsequent parametric perfusion maps were calculated using RAPID software. CONTRAST:  90 cc Isovue 370 COMPARISON:  03/09/2018, 03/04/2018, 02/16/2018 CT head. 02/16/2018 MRI head. FINDINGS: CTA NECK FINDINGS Aortic arch: Standard branching. Imaged portion shows no evidence of aneurysm or dissection. No significant stenosis of the major arch vessel origins. Mild calcific aortic atherosclerosis. Right carotid system: No evidence of dissection, stenosis (50% or greater) or occlusion. Mild non stenotic calcific atherosclerosis of the carotid bifurcation. Left carotid system: No evidence of dissection, stenosis (50% or greater) or occlusion. Minimal non stenotic calcific atherosclerosis of the carotid bifurcation. Vertebral arteries: Left dominant, diminutive right vertebral artery. No evidence of dissection, stenosis (50% or greater) or occlusion. Skeleton: Moderate spondylosis of the cervical spine with multilevel disc and facet degenerative changes. No high-grade bony canal stenosis. Other  neck: Negative. Upper chest: Negative. Review of the MIP images confirms the above findings CTA HEAD FINDINGS Anterior circulation: No significant stenosis, proximal occlusion, aneurysm, or vascular malformation. Mild non stenotic calcific atherosclerosis of the carotid siphons. Posterior circulation: No significant stenosis, proximal occlusion, aneurysm, or vascular malformation. Venous sinuses: As permitted by contrast timing, patent. Anatomic variants: Right vertebral artery largely terminates in the right PICA with diminutive contribution to the basilar. Complete circle-of-Willis. Large left A1, large anterior communicating artery, diminutive right A1, normal variant Review of the MIP images confirms the above findings CT Brain Perfusion Findings: CBF (<30%) Volume: 61mL Perfusion (Tmax>6.0s) volume: 81mL Mismatch Volume: 4mL Infarction Location:Left parietal lobe subacute infarction visible on noncontrast CT, pseudonormalized perfusion. IMPRESSION: 1. Patent carotid and vertebral arteries. No dissection, aneurysm, or hemodynamically significant stenosis utilizing NASCET criteria. 2. Patent anterior and posterior intracranial circulation. No large vessel occlusion, aneurysm, or significant stenosis. 3. Left parietal lobe subacute infarction visible on noncontrast CT with pseudonormalized perfusion. 4. No perfusion anomaly to suggest interval acute infarct. Electronically Signed   By: Kristine Garbe M.D.   On: 03/09/2018 18:44   Ct Head Wo Contrast  Result Date: 03/09/2018 CLINICAL DATA:  Residual right-sided weakness. EXAM: CT HEAD WITHOUT CONTRAST TECHNIQUE: Contiguous axial images were obtained from the base of the skull through the vertex without intravenous contrast. COMPARISON:  03/04/2018 CT.  02/16/2018 MRI. FINDINGS: Brain: There is acute to subacute infarction in the left parietal lobe consistent with left MCA branch vessel infarction. The brain shows low-density with mild swelling. No  hemorrhage. No other abnormal brain finding. No mass, hydrocephalus or extra-axial collection. Vascular: There is atherosclerotic calcification of the major vessels at the base of the brain. Skull: Negative Sinuses/Orbits: Clear/normal Other: None IMPRESSION: Acute/subacute infarction in the left parietal lobe consistent with MCA branch vessel infarction. No evidence of hemorrhage or mass effect. Electronically Signed   By: Nelson Chimes M.D.   On: 03/09/2018 13:31   Ct Head Wo Contrast  Result Date: 03/04/2018 CLINICAL DATA:  Altered level of consciousness EXAM: CT HEAD WITHOUT CONTRAST TECHNIQUE: Contiguous axial images were obtained from the base of the skull through the vertex without intravenous contrast. COMPARISON:  02/16/2018 FINDINGS: Brain: No evidence of acute infarction, hemorrhage, hydrocephalus, extra-axial collection or mass lesion/mass effect. Vascular: No hyperdense vessel or unexpected calcification. Skull: Normal. Negative for fracture or focal lesion. Sinuses/Orbits: No acute finding. Other: None. IMPRESSION:  No acute abnormality noted.  No change from the prior exam. Electronically Signed   By: Inez Catalina M.D.   On: 03/04/2018 09:33   Ct Head Wo Contrast  Result Date: 02/16/2018 CLINICAL DATA:  Generalize weakness and slurred speech for 2 weeks. EXAM: CT HEAD WITHOUT CONTRAST TECHNIQUE: Contiguous axial images were obtained from the base of the skull through the vertex without intravenous contrast. COMPARISON:  04/01/2005 FINDINGS: Brain: No evidence of acute infarction, hemorrhage, hydrocephalus, extra-axial collection or mass lesion/mass effect. Vascular: Mild intracranial arterial vascular calcifications. Skull: Calvarium appears intact. Sinuses/Orbits: Paranasal sinuses and mastoid air cells are clear. Other: None. IMPRESSION: No acute intracranial abnormality. Electronically Signed   By: Lucienne Capers M.D.   On: 02/16/2018 06:24   Ct Angio Neck W Or Wo Contrast  Result  Date: 03/09/2018 CLINICAL DATA:  67 y/o F; patient extubated yesterday. Right-sided weakness and slow speech. Increased lethargy. EXAM: CT ANGIOGRAPHY HEAD AND NECK CT PERFUSION BRAIN TECHNIQUE: Multidetector CT imaging of the head and neck was performed using the standard protocol during bolus administration of intravenous contrast. Multiplanar CT image reconstructions and MIPs were obtained to evaluate the vascular anatomy. Carotid stenosis measurements (when applicable) are obtained utilizing NASCET criteria, using the distal internal carotid diameter as the denominator. Multiphase CT imaging of the brain was performed following IV bolus contrast injection. Subsequent parametric perfusion maps were calculated using RAPID software. CONTRAST:  90 cc Isovue 370 COMPARISON:  03/09/2018, 03/04/2018, 02/16/2018 CT head. 02/16/2018 MRI head. FINDINGS: CTA NECK FINDINGS Aortic arch: Standard branching. Imaged portion shows no evidence of aneurysm or dissection. No significant stenosis of the major arch vessel origins. Mild calcific aortic atherosclerosis. Right carotid system: No evidence of dissection, stenosis (50% or greater) or occlusion. Mild non stenotic calcific atherosclerosis of the carotid bifurcation. Left carotid system: No evidence of dissection, stenosis (50% or greater) or occlusion. Minimal non stenotic calcific atherosclerosis of the carotid bifurcation. Vertebral arteries: Left dominant, diminutive right vertebral artery. No evidence of dissection, stenosis (50% or greater) or occlusion. Skeleton: Moderate spondylosis of the cervical spine with multilevel disc and facet degenerative changes. No high-grade bony canal stenosis. Other neck: Negative. Upper chest: Negative. Review of the MIP images confirms the above findings CTA HEAD FINDINGS Anterior circulation: No significant stenosis, proximal occlusion, aneurysm, or vascular malformation. Mild non stenotic calcific atherosclerosis of the carotid  siphons. Posterior circulation: No significant stenosis, proximal occlusion, aneurysm, or vascular malformation. Venous sinuses: As permitted by contrast timing, patent. Anatomic variants: Right vertebral artery largely terminates in the right PICA with diminutive contribution to the basilar. Complete circle-of-Willis. Large left A1, large anterior communicating artery, diminutive right A1, normal variant Review of the MIP images confirms the above findings CT Brain Perfusion Findings: CBF (<30%) Volume: 31mL Perfusion (Tmax>6.0s) volume: 2mL Mismatch Volume: 63mL Infarction Location:Left parietal lobe subacute infarction visible on noncontrast CT, pseudonormalized perfusion. IMPRESSION: 1. Patent carotid and vertebral arteries. No dissection, aneurysm, or hemodynamically significant stenosis utilizing NASCET criteria. 2. Patent anterior and posterior intracranial circulation. No large vessel occlusion, aneurysm, or significant stenosis. 3. Left parietal lobe subacute infarction visible on noncontrast CT with pseudonormalized perfusion. 4. No perfusion anomaly to suggest interval acute infarct. Electronically Signed   By: Kristine Garbe M.D.   On: 03/09/2018 18:44   Ct Chest Wo Contrast  Result Date: 03/12/2018 CLINICAL DATA:  Assess empyema. EXAM: CT CHEST WITHOUT CONTRAST TECHNIQUE: Multidetector CT imaging of the chest was performed following the standard protocol without IV contrast. COMPARISON:  Chest  radiograph performed 03/08/2018, and CT of the chest performed 03/04/2018 FINDINGS: Cardiovascular: Scattered coronary artery calcifications are seen. The heart is normal in size. The patient's left subclavian line is noted at the distal SVC. Mediastinum/Nodes: No pericardial effusion is identified. The thyroid gland is unremarkable in appearance. No mediastinal lymphadenopathy is seen. No axillary lymphadenopathy is appreciated. Lungs/Pleura: There has been placement of a left apical chest tube, with  decrease in the size of the left-sided pleural effusion. Residual small bilateral pleural effusions are noted, somewhat loculated on the left. A new focus of increased attenuation is noted within the left-sided pleural effusion, which may reflect trace hemorrhage. No definite parenchymal necrosis is seen. There is collapse of much of the left lower lung lobe. Mild right-sided atelectasis is noted. The expanded portions of both lungs are grossly clear. No definite pneumothorax is seen. Slight irregularity in the contour of the proximal trachea may reflect sequelae of prior instrumentation. Upper Abdomen: A small amount of ascites is noted tracking about the liver. The visualized portions of the liver and spleen are otherwise unremarkable. Musculoskeletal: No acute osseous abnormalities are identified. The visualized musculature is unremarkable in appearance. IMPRESSION: 1. Placement of left apical chest tube, with decrease in the size of the left-sided pleural effusion. Residual small bilateral pleural effusions, somewhat loculated on the left. Empyema cannot be excluded, given clinical concern. 2. New focus of increased attenuation within the left-sided pleural effusion may reflect trace hemorrhage. No definite evidence of parenchymal necrosis. 3. Collapse of much of the left lower lung lobe. Mild right-sided atelectasis. 4. Scattered coronary artery calcifications seen. 5. Small amount of ascites noted tracking about the liver. Electronically Signed   By: Garald Balding M.D.   On: 03/12/2018 23:00   Ct Chest Wo Contrast  Result Date: 03/04/2018 CLINICAL DATA:  Shortness of breath, pneumonia EXAM: CT CHEST WITHOUT CONTRAST TECHNIQUE: Multidetector CT imaging of the chest was performed following the standard protocol without IV contrast. COMPARISON:  Chest x-ray 03/04/2018 FINDINGS: Cardiovascular: Heart is normal size. Aorta is normal caliber with scattered aortic calcifications. Moderate coronary artery  calcifications in the left anterior descending coronary artery. Mediastinum/Nodes: Reactive borderline sized mediastinal lymph nodes. Lungs/Pleura: Complex large left pleural effusions with loculation and pleural gas. Cannot exclude bronchopleural fistula or empyema. Collapse of much of the left lung with only a small amount of aerated left upper lobe. Nodular airspace opacities in the right upper lobe and superior segment of the right lower lobe with trace right pleural effusion. Upper Abdomen: Imaging into the upper abdomen shows no acute findings. Musculoskeletal: Chest wall soft tissues are unremarkable. Degenerative changes in the thoracic spine. No acute bony abnormality. IMPRESSION: Large complex left pleural effusion with pleural gas and extensive loculations. This could reflect bronchopleural fistula or empyema. Collapse of much of the left lung with only a small amount of aerated left upper lobe. Nodular ground-glass airspace opacities centrally in the right upper lobe and in the superior segment of the right lower lobe, likely infectious/inflammatory. Coronary artery disease, aortic atherosclerosis. Electronically Signed   By: Rolm Baptise M.D.   On: 03/04/2018 11:39   Mr Brain Wo Contrast  Result Date: 03/10/2018 CLINICAL DATA:  Stroke follow-up EXAM: MRI HEAD WITHOUT CONTRAST TECHNIQUE: Multiplanar, multiecho pulse sequences of the brain and surrounding structures were obtained without intravenous contrast. COMPARISON:  Brain MRI 02/16/2018 FINDINGS: BRAIN: There are multiple foci of abnormal diffusion restriction, the largest of which is located in the left parietal lobe. Other foci are located  in the posterior left frontal lobe, right parietal lobe and the pons. There is no midline shift or mass effect. Mild cytotoxic edema of the left parietal lobe. Minimal white matter hyperintensity for age. Brain parenchyma is otherwise normal. The cerebral and cerebellar volume are age-appropriate.  Susceptibility-sensitive sequences show no chronic microhemorrhage or superficial siderosis. VASCULAR: Major intracranial arterial and venous sinus flow voids are normal. SKULL AND UPPER CERVICAL SPINE: Calvarial bone marrow signal is normal. There is no skull base mass. Visualized upper cervical spine and soft tissues are normal. SINUSES/ORBITS: No fluid levels or advanced mucosal thickening. No mastoid or middle ear effusion. The orbits are normal. IMPRESSION: 1. Multifocal acute ischemia, spread throughout multiple bilateral vascular territories. 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 2. The largest infarction is in the left parietal lobe. Other sites include the posterior left frontal lobe, right parietal lobe and pons. 3. No hemorrhage or mass effect. Electronically Signed   By: Ulyses Jarred M.D.   On: 03/10/2018 06:17   Mr Brain Wo Contrast  Result Date: 02/16/2018 CLINICAL DATA:  Weight loss and weakness. EXAM: MRI HEAD WITHOUT CONTRAST TECHNIQUE: Multiplanar, multiecho pulse sequences of the brain and surrounding structures were obtained without intravenous contrast. COMPARISON:  CT same day FINDINGS: Brain: Generalized atrophy. Diffusion imaging does not show any acute or subacute infarction. The brainstem and cerebellum are normal. Mild chronic small-vessel ischemic change of the cerebral hemispheric deep white matter. No cortical or large vessel territory infarction. No mass lesion, hemorrhage, hydrocephalus or extra-axial collection. Vascular: Major vessels at the base of the brain show flow. Skull and upper cervical spine: Negative Sinuses/Orbits: Clear/normal Other: None IMPRESSION: No acute or reversible finding. Generalized atrophy. Mild chronic small-vessel ischemic change of the cerebral hemispheric white matter. Electronically Signed   By: Nelson Chimes M.D.   On: 02/16/2018 21:52   Nm Hepato W/eject Fract  Result Date: 02/18/2018 CLINICAL DATA:  Weight loss. EXAM: NUCLEAR  MEDICINE HEPATOBILIARY IMAGING WITH GALLBLADDER EF TECHNIQUE: Sequential images of the abdomen were obtained out to 60 minutes following intravenous administration of radiopharmaceutical. After oral ingestion of Ensure, gallbladder ejection fraction was determined. At 60 min, normal ejection fraction is greater than 33%. RADIOPHARMACEUTICALS:  5.3 mCi Tc-42m  Choletec IV COMPARISON:  None. FINDINGS: Prompt uptake and biliary excretion of activity by the liver is seen. Gallbladder activity is visualized, consistent with patency of cystic duct. Biliary activity passes into small bowel, consistent with patent common bile duct. The gallbladder emptied spontaneously before the Ensure. As a result, an ejection fraction cannot be accurately calculated. That being said, the gallbladder is not visualized after ensure and gallbladder emptying is near-complete. IMPRESSION: The gallbladder emptied spontaneously before consumption of Ensure. While an ejection fraction cannot be calculated, gallbladder emptying is near complete. Electronically Signed   By: Dorise Bullion III M.D   On: 02/18/2018 13:40   Ct Cerebral Perfusion W Contrast  Result Date: 03/09/2018 CLINICAL DATA:  67 y/o F; patient extubated yesterday. Right-sided weakness and slow speech. Increased lethargy. EXAM: CT ANGIOGRAPHY HEAD AND NECK CT PERFUSION BRAIN TECHNIQUE: Multidetector CT imaging of the head and neck was performed using the standard protocol during bolus administration of intravenous contrast. Multiplanar CT image reconstructions and MIPs were obtained to evaluate the vascular anatomy. Carotid stenosis measurements (when applicable) are obtained utilizing NASCET criteria, using the distal internal carotid diameter as the denominator. Multiphase CT imaging of the brain was performed following IV bolus contrast injection. Subsequent parametric perfusion maps were calculated  using RAPID software. CONTRAST:  90 cc Isovue 370 COMPARISON:  03/09/2018,  03/04/2018, 02/16/2018 CT head. 02/16/2018 MRI head. FINDINGS: CTA NECK FINDINGS Aortic arch: Standard branching. Imaged portion shows no evidence of aneurysm or dissection. No significant stenosis of the major arch vessel origins. Mild calcific aortic atherosclerosis. Right carotid system: No evidence of dissection, stenosis (50% or greater) or occlusion. Mild non stenotic calcific atherosclerosis of the carotid bifurcation. Left carotid system: No evidence of dissection, stenosis (50% or greater) or occlusion. Minimal non stenotic calcific atherosclerosis of the carotid bifurcation. Vertebral arteries: Left dominant, diminutive right vertebral artery. No evidence of dissection, stenosis (50% or greater) or occlusion. Skeleton: Moderate spondylosis of the cervical spine with multilevel disc and facet degenerative changes. No high-grade bony canal stenosis. Other neck: Negative. Upper chest: Negative. Review of the MIP images confirms the above findings CTA HEAD FINDINGS Anterior circulation: No significant stenosis, proximal occlusion, aneurysm, or vascular malformation. Mild non stenotic calcific atherosclerosis of the carotid siphons. Posterior circulation: No significant stenosis, proximal occlusion, aneurysm, or vascular malformation. Venous sinuses: As permitted by contrast timing, patent. Anatomic variants: Right vertebral artery largely terminates in the right PICA with diminutive contribution to the basilar. Complete circle-of-Willis. Large left A1, large anterior communicating artery, diminutive right A1, normal variant Review of the MIP images confirms the above findings CT Brain Perfusion Findings: CBF (<30%) Volume: 21mL Perfusion (Tmax>6.0s) volume: 14mL Mismatch Volume: 46mL Infarction Location:Left parietal lobe subacute infarction visible on noncontrast CT, pseudonormalized perfusion. IMPRESSION: 1. Patent carotid and vertebral arteries. No dissection, aneurysm, or hemodynamically significant stenosis  utilizing NASCET criteria. 2. Patent anterior and posterior intracranial circulation. No large vessel occlusion, aneurysm, or significant stenosis. 3. Left parietal lobe subacute infarction visible on noncontrast CT with pseudonormalized perfusion. 4. No perfusion anomaly to suggest interval acute infarct. Electronically Signed   By: Kristine Garbe M.D.   On: 03/09/2018 18:44   Dg Chest Port 1 View  Result Date: 03/14/2018 CLINICAL DATA:  Followup for pneumothorax. EXAM: PORTABLE CHEST 1 VIEW COMPARISON:  03/13/2018 and older studies. FINDINGS: No pneumothorax. There is left greater than right lung base opacity consistent with a combination of atelectasis and pleural fluid, without significant change from the previous day's study. No new lung abnormalities. Left subclavian central venous line has been removed. IMPRESSION: 1. No pneumothorax. 2. Persistent, left greater than right lung base opacities consistent with combination of pleural fluid and atelectasis. 3. No new abnormalities.  No change from the most recent prior exam. Electronically Signed   By: Lajean Manes M.D.   On: 03/14/2018 08:05   Dg Chest Port 1 View  Result Date: 03/13/2018 CLINICAL DATA:  Status post chest tube removal. EXAM: PORTABLE CHEST 1 VIEW COMPARISON:  Chest CT, 03/12/2018.  Chest radiographs, 03/08/2018. FINDINGS: Status post left chest tube removal.  No pneumothorax. Persistent lung base opacities, left greater than right, consistent with a combination of pleural fluid and atelectasis. No new lung abnormalities. Left subclavian central venous line is stable and well positioned. IMPRESSION: 1. No pneumothorax following left chest tube removal. 2. Stable, left greater than right, lung base opacity consistent with pleural effusions with atelectasis. Electronically Signed   By: Lajean Manes M.D.   On: 03/13/2018 16:24   Dg Chest Port 1 View  Result Date: 03/08/2018 CLINICAL DATA:  67 year old female with empyema.  Subsequent encounter. EXAM: PORTABLE CHEST 1 VIEW COMPARISON:  03/06/2018 chest x-ray and 03/04/2018 chest CT. FINDINGS: Left-sided chest tube is in place. Left chest wall subcutaneous emphysema  once again noted. Questionable tiny left apical pneumothorax versus overlapping structures. Persistent consolidation left lung base. This may represent residua of empyema and underlying atelectasis or infiltrate. The gas containing component of empyema noted on recent chest x-ray and prior CT are not well delineated on the present exam. Endotracheal tube tip 3.4 cm above the carina. Left central line tip distal superior vena cava level. Nasogastric tube courses below the diaphragm. Tip is not included on the present exam. Heart size top-normal slightly enlarged.  Calcified aorta. IMPRESSION: 1. Left-sided chest tube is in place. Left chest wall subcutaneous emphysema once again noted. 2. Questionable tiny left apical pneumothorax versus overlapping structures. 3. Persistent consolidation left lung base. This may represent residua of empyema and underlying atelectasis or infiltrate. The gas containing component of empyema noted on recent chest x-ray and prior CT are not well delineated on the present exam. Electronically Signed   By: Genia Del M.D.   On: 03/08/2018 07:18   Dg Chest Port 1 View  Result Date: 03/06/2018 CLINICAL DATA:  Empyema with [place] EXAM: PORTABLE CHEST 1 VIEW COMPARISON:  05/13/2009 FINDINGS: endotracheal tube, NG tube, and central venous line unchanged. LEFT chest tube in place with LEFT basilar atelectasis and consolidation. Shallow pneumothorax along the lateral chest wall adjacent to the along the tract of the chest tube. Small amount subpulmonic gas additionally. Extensive subcutaneous gas along the LEFT chest wall associated with chest tube. RIGHT lung is clear. IMPRESSION: 1. Small pneumothorax in the lateral LEFT hemithorax adjacent to the chest tube. 2. LEFT basilar atelectasis and  consolidation. 3. Stable support apparatus. Electronically Signed   By: Suzy Bouchard M.D.   On: 03/06/2018 08:23   Dg Chest Port 1 View  Result Date: 03/05/2018 CLINICAL DATA:  ET tube and chest tube present, respiratory failure. EXAM: PORTABLE CHEST 1 VIEW COMPARISON:  03/04/2018. FINDINGS: Support tubes and lines remain stable. LEFT chest tube redemonstrated, with slight improved basilar, apical, and lateral pneumothorax. Overall pneumothorax estimated 10% or prep slightly greater. RIGHT lung clear. RIGHT base volume loss with effusion. Cardiomediastinal silhouette unchanged. Subcutaneous air is increased. IMPRESSION: Slight decrease LEFT pneumothorax, with significant residual components throughout the hemithorax. Increased subcutaneous air. Electronically Signed   By: Staci Righter M.D.   On: 03/05/2018 09:10   Dg Chest Port 1 View  Result Date: 03/04/2018 CLINICAL DATA:  Endotracheal tube, nasogastric tube and chest tube placement. EXAM: PORTABLE CHEST 1 VIEW COMPARISON:  Chest x-ray from earlier same day. FINDINGS: LEFT-sided chest tube in place. Improved aeration of the LEFT hemithorax. Persistent pneumothorax at the LEFT lung base, moderate in size, with associated overlying airspace collapse. Small component of the pneumothorax seen at the LEFT lung apex. Dense opacity overlying the LEFT heart border is also compatible with atelectasis. RIGHT lung is clear. Heart size and mediastinal contours are within normal limits. Endotracheal tube appears well positioned with tip approximately 3 cm above the carina. Enteric tube passes below the diaphragm. LEFT subclavian central line appears well positioned with tip at the level of the lower SVC/cavoatrial junction. IMPRESSION: 1. Support apparatus appears appropriately positioned, as detailed above. 2. Significantly improved aeration of the LEFT hemithorax status post LEFT-sided chest tube placement. Persistent pneumothorax on the LEFT, moderate in size,  main component at the LEFT lung base with associated perihilar airspace collapse. Electronically Signed   By: Franki Cabot M.D.   On: 03/04/2018 17:26   Dg Chest Port 1 View  Result Date: 03/04/2018 CLINICAL DATA:  67 year old female  with altered mental status, not responding. EXAM: PORTABLE CHEST 1 VIEW COMPARISON:  Portable chest 02/22/2018. FINDINGS: Portable AP upright view at 0854 hours. Near complete opacification of the left hemithorax where as only mild left lung base opacity was present 10 days ago. No shift of the mediastinum. The right lung remains clear allowing for portable technique. Visualized tracheal air column is within normal limits. No pneumothorax. Paucity bowel gas in the upper abdomen. No acute osseous abnormality identified. IMPRESSION: New subtotal opacification of the left hemithorax is nonspecific but suspicious for combination of left lung consolidation and pleural effusion. The right lung remains clear. Electronically Signed   By: Genevie Ann M.D.   On: 03/04/2018 09:41   Dg Chest Port 1 View  Result Date: 02/22/2018 CLINICAL DATA:  Leukocytosis. EXAM: PORTABLE CHEST 1 VIEW COMPARISON:  None. FINDINGS: Subtle asymmetric prominence of the LEFT perihilar shadow, suspicious for perihilar pneumonia and/or lymphadenopathy. Subtle opacity at the LEFT lung base, compatible with pneumonia or mild atelectasis. RIGHT lung is clear. Osseous structures about the chest are unremarkable. IMPRESSION: 1. Suspected LEFT perihilar pneumonia and/or lymphadenopathy. Consider chest CT for confirmation. 2. Subtle opacity at the LEFT lung base which could represent pneumonia, atelectasis and/or small pleural effusion. Electronically Signed   By: Franki Cabot M.D.   On: 02/22/2018 16:10   Dg Swallowing Func-speech Pathology  Result Date: 03/12/2018 Objective Swallowing Evaluation: Type of Study: MBS-Modified Barium Swallow Study  Patient Details Name: Maria Avila MRN: [id number] Date of Birth:  [birth year] Today's Date: 03/12/2018 Time: SLP Start Time (ACUTE ONLY): 1629 -SLP Stop Time (ACUTE ONLY): 1655 SLP Time Calculation (min) (ACUTE ONLY): 26 min Past Medical History: Past Medical History: Diagnosis Date . Colitis  . Diabetes mellitus without complication ([place])  . Hypertension  . Malnutrition ([place])  Past Surgical History: Past Surgical History: Procedure Laterality Date . BIOPSY  02/26/2018  Procedure: BIOPSY;  Surgeon: Wilford Corner, MD;  Location: [place];  Service: Endoscopy;; . COLONOSCOPY WITH PROPOFOL N/A 02/26/2018  Procedure: COLONOSCOPY WITH PROPOFOL;  Surgeon: Wilford Corner, MD;  Location: [place];  Service: Endoscopy;  Laterality: N/A; . ESOPHAGOGASTRODUODENOSCOPY (EGD) WITH PROPOFOL N/A 02/26/2018  Procedure: ESOPHAGOGASTRODUODENOSCOPY (EGD) WITH PROPOFOL;  Surgeon: Wilford Corner, MD;  Location: [place];  Service: Endoscopy;  Laterality: N/A; . NO PAST SURGERIES    UNCERTAIN OF NAME OF SURGERY HPI: Pt is a 67 year old admitted with respiratory failure, left lung empyema s/p CT placement 10/31, and septic shock. ETT 10/31-11/4. MRI 11/6 showed multifocal, bilateral acute ischemia with largest infarction in the L parietal lobe. Additional areas include the L frontal lobe, R parietal lobe, and pons. PMH: malnutrition and unexplained FTT (s/p recent GI and psych consults), HTN, DM  Subjective: pt alert but confused Assessment / Plan / Recommendation CHL IP CLINICAL IMPRESSIONS 03/12/2018 Clinical Impression Pt has a mild-moderate oral dysphagia that is felt to be at least in part due to cognitive status. She has reduced bolus cohesion, slow posterior transit, premature spillage particularly wtih thin liquids, and lingual residue. Occasional oral holding was noted, requiring only Min cues from SLP. With extra time and use of additional swallows she is able to sufficiently clear her oral cavity, but it is cumbersome. Her pharyngeal phase is timely and strong with good  airway protection and clearance. Recommend starting Dys 1 diet and thin liquids with full supervision. SLP to f/u clinically for readiness to advance solids. Will also f/u for completion of speech/language evaluation. SLP Visit Diagnosis Dysphagia, oral phase (R13.11) Attention  and concentration deficit following -- Frontal lobe and executive function deficit following -- Impact on safety and function Mild aspiration risk   CHL IP TREATMENT RECOMMENDATION 03/12/2018 Treatment Recommendations Therapy as outlined in treatment plan below   Prognosis 03/12/2018 Prognosis for Safe Diet Advancement Good Barriers to Reach Goals Cognitive deficits Barriers/Prognosis Comment -- CHL IP DIET RECOMMENDATION 03/12/2018 SLP Diet Recommendations Dysphagia 1 (Puree) solids;Thin liquid Liquid Administration via Straw;Cup Medication Administration Crushed with puree Compensations Slow rate;Small sips/bites Postural Changes Seated upright at 90 degrees   CHL IP OTHER RECOMMENDATIONS 03/12/2018 Recommended Consults -- Oral Care Recommendations Oral care BID Other Recommendations Have oral suction available   CHL IP FOLLOW UP RECOMMENDATIONS 03/12/2018 Follow up Recommendations Skilled Nursing facility   [facility] IP FREQUENCY AND DURATION 03/12/2018 Speech Therapy Frequency (ACUTE ONLY) min 2x/week Treatment Duration 2 weeks      CHL IP ORAL PHASE 03/12/2018 Oral Phase Impaired Oral - Pudding Teaspoon -- Oral - Pudding Cup -- Oral - Honey Teaspoon -- Oral - Honey Cup -- Oral - Nectar Teaspoon -- Oral - Nectar Cup -- Oral - Nectar Straw -- Oral - Thin Teaspoon -- Oral - Thin Cup Holding of bolus;Lingual/palatal residue;Delayed oral transit;Decreased bolus cohesion;Premature spillage Oral - Thin Straw Holding of bolus;Lingual/palatal residue;Delayed oral transit;Decreased bolus cohesion;Premature spillage Oral - Puree Holding of bolus;Lingual/palatal residue;Delayed oral transit;Decreased bolus cohesion;Premature spillage Oral - Mech Soft -- Oral -  Regular -- Oral - Multi-Consistency -- Oral - Pill -- Oral Phase - Comment --  CHL IP PHARYNGEAL PHASE 03/12/2018 Pharyngeal Phase WFL Pharyngeal- Pudding Teaspoon -- Pharyngeal -- Pharyngeal- Pudding Cup -- Pharyngeal -- Pharyngeal- Honey Teaspoon -- Pharyngeal -- Pharyngeal- Honey Cup -- Pharyngeal -- Pharyngeal- Nectar Teaspoon -- Pharyngeal -- Pharyngeal- Nectar Cup -- Pharyngeal -- Pharyngeal- Nectar Straw -- Pharyngeal -- Pharyngeal- Thin Teaspoon -- Pharyngeal -- Pharyngeal- Thin Cup -- Pharyngeal -- Pharyngeal- Thin Straw -- Pharyngeal -- Pharyngeal- Puree -- Pharyngeal -- Pharyngeal- Mechanical Soft -- Pharyngeal -- Pharyngeal- Regular -- Pharyngeal -- Pharyngeal- Multi-consistency -- Pharyngeal -- Pharyngeal- Pill -- Pharyngeal -- Pharyngeal Comment --  CHL IP CERVICAL ESOPHAGEAL PHASE 03/12/2018 Cervical Esophageal Phase WFL Pudding Teaspoon -- Pudding Cup -- Honey Teaspoon -- Honey Cup -- Nectar Teaspoon -- Nectar Cup -- Nectar Straw -- Thin Teaspoon -- Thin Cup -- Thin Straw -- Puree -- Mechanical Soft -- Regular -- Multi-consistency -- Pill -- Cervical Esophageal Comment -- Maria Avila 03/12/2018, 5:23 PM  Maria Avila, M.A. CCC-SLP Acute Rehabilitation Services Pager [phone] Office [phone]             Maria Avila Transcranial Doppler W Bubbles  Result Date: 03/11/2018  Transcranial Doppler with Bubble Indications: Stroke. Performing Technologist: Landry Mellow RDMS, RVT  Examination Guidelines: A complete evaluation includes B-mode imaging, spectral Doppler, color Doppler, and power Doppler as needed of all accessible portions of each vessel. Bilateral testing is considered an integral part of a complete examination. Limited examinations for reoccurring indications may be performed as noted.  Summary:  A vascular evaluation was performed. The right middle cerebral artery was studied. An IV was inserted into the patient's right upper arm PICC line. Verbal informed consent was obtained.   Limited study due to poor patient cooperation, unable to perform Valsalva. No HITS heard at rest. No evidence of PFO with this test. Negative TCD Bubble study *See table(s) above for measurements and observations.  Diagnosing physician: Antony Contras MD Electronically signed by Antony Contras MD on 03/11/2018 at 3:51:31 PM.    Final  Maria Avila Lower Extremity Venous (dvt)  Result Date: 03/10/2018  Lower Venous Study Indications: Stroke, and embolic stroke.  Limitations: Body habitus, bandages, line and adjecent arterial cacification shadowing,severe edema and immobility. Performing Technologist: Lorina Rabon  Examination Guidelines: A complete evaluation includes B-mode imaging, spectral Doppler, color Doppler, and power Doppler as needed of all accessible portions of each vessel. Bilateral testing is considered an integral part of a complete examination. Limited examinations for reoccurring indications may be performed as noted.  Right Venous Findings: +---------+---------------+---------+-----------+----------+-------------------+          CompressibilityPhasicitySpontaneityPropertiesSummary             +---------+---------------+---------+-----------+----------+-------------------+ CFV      Full           Yes      Yes                                      +---------+---------------+---------+-----------+----------+-------------------+ SFJ      Full                                                             +---------+---------------+---------+-----------+----------+-------------------+ FV Prox  Full                                                             +---------+---------------+---------+-----------+----------+-------------------+ FV Mid   Full                                                             +---------+---------------+---------+-----------+----------+-------------------+ FV DistalFull                                                              +---------+---------------+---------+-----------+----------+-------------------+ PFV      Full                                                             +---------+---------------+---------+-----------+----------+-------------------+ POP      Full           Yes      Yes                                      +---------+---------------+---------+-----------+----------+-------------------+ PTV      None                    No  Acute               +---------+---------------+---------+-----------+----------+-------------------+ PERO                                                  not well visualized +---------+---------------+---------+-----------+----------+-------------------+  Left Venous Findings: +---------+---------------+---------+-----------+----------+-------------------+          CompressibilityPhasicitySpontaneityPropertiesSummary             +---------+---------------+---------+-----------+----------+-------------------+ CFV      Full           Yes      Yes                                      +---------+---------------+---------+-----------+----------+-------------------+ SFJ      Full                                                             +---------+---------------+---------+-----------+----------+-------------------+ FV Prox  Full                                                             +---------+---------------+---------+-----------+----------+-------------------+ FV Mid   Full                                                             +---------+---------------+---------+-----------+----------+-------------------+ FV DistalFull                                                             +---------+---------------+---------+-----------+----------+-------------------+ PFV      Full                                                              +---------+---------------+---------+-----------+----------+-------------------+ POP      Full           Yes      Yes                                      +---------+---------------+---------+-----------+----------+-------------------+ PTV  not well visualized +---------+---------------+---------+-----------+----------+-------------------+ PERO     None                    No                   Acute               +---------+---------------+---------+-----------+----------+-------------------+    Summary: Right: Findings consistent with acute deep vein thrombosis involving the right posterior tibial vein. No cystic structure found in the popliteal fossa. Left: Findings consistent with acute deep vein thrombosis involving the left peroneal vein. No cystic structure found in the popliteal fossa.  *See table(s) above for measurements and observations. Electronically signed by Curt Jews MD on 03/10/2018 at 2:11:56 PM.    Final    Avila Ekg Site Rite  Result Date: 03/12/2018 If Site Rite image not attached, placement could not be confirmed due to current cardiac rhythm.  Dg Esophagus W/water Sol Cm  Result Date: 03/12/2018 CLINICAL DATA:  67 year old female with clinical concern for possible esophageal perforation. EXAM: ESOPHOGRAM/BARIUM SWALLOW TECHNIQUE: Single contrast examination was performed using thin barium or water soluble. FLUOROSCOPY TIME:  Fluoroscopy Time:  1 minutes and 24 seconds Radiation Exposure Index (if provided by the fluoroscopic device): 16.5 mGy COMPARISON:  None. FINDINGS: Limited single contrast esophagram with water-soluble contrast material (Omnipaque 300) performed. Failure to fully propagate any normal primary peristaltic waves. Extensive tertiary contractions. No gross evidence of esophageal mass, stricture or esophageal ring. No hiatal hernia. No extravasation of contrast material from the lumen of the esophagus at  any time during the examination. IMPRESSION: 1. No findings to suggest esophageal perforation. 2. Nonspecific esophageal motility disorder with severe tertiary contractions. Electronically Signed   By: Vinnie Langton M.D.   On: 03/12/2018 10:55    Micro Results     No results found for this or any previous visit (from the past 240 hour(s)).     Today   Subjective:   Maria Avila today has no headache,no chest or abdominal pain, feels much better  today.  He feels bloated after drinking her Ensure, she thinks is due to lactose intolerance.  Objective:   Blood pressure (!) 143/92, pulse (!) 102, temperature 98.2 F (36.8 C), temperature source Oral, resp. rate 16, height 5\' 2"  (1.575 m), weight 65.1 kg, SpO2 100 %.   Intake/Output Summary (Last 24 hours) at 03/17/2018 1211 Last data filed at 03/17/2018 0215 Gross per 24 hour  Intake 160 ml  Output 1450 ml  Net -1290 ml    Exam Awake Alert, Oriented x 3, No new F.N deficits, Normal affect Symmetrical Chest wall movement, Good air movement bilaterally, CTAB RRR,No Gallops,Rubs or new Murmurs, No Parasternal Heave +ve B.Sounds, Abd Soft, Non tender,  No rebound -guarding or rigidity. No Cyanosis, Clubbing or edema, No new Rash or bruise  Data Review   CBC w Diff:  Lab Results  Component Value Date   WBC 12.8 (H) 03/16/2018   HGB 8.7 (L) 03/16/2018   HGB 11.3 (L) 07/21/2017   HCT 26.8 (L) 03/16/2018   HCT 23.7 (L) 02/17/2018   PLT 296 03/16/2018   PLT 186 07/21/2017   LYMPHOPCT 12 03/10/2018   MONOPCT 4 03/10/2018   EOSPCT 0 03/10/2018   BASOPCT 0 03/10/2018    CMP:  Lab Results  Component Value Date   NA 135 03/16/2018   K 3.3 (L) 03/16/2018   CL 109 03/16/2018   CO2 21 (L) 03/16/2018  BUN 8 03/16/2018   CREATININE 0.49 03/16/2018   CREATININE 0.78 07/21/2017   PROT 4.5 (L) 03/11/2018   ALBUMIN 1.4 (L) 03/11/2018   BILITOT 0.5 03/11/2018   BILITOT 1.2 07/21/2017   ALKPHOS 326 (H) 03/11/2018   AST  34 03/11/2018   AST 111 (H) 07/21/2017   ALT 36 03/11/2018   ALT 47 07/21/2017  .   Total Time in preparing paper work, data evaluation and todays exam - 31 minutes  Phillips Climes M.D on 03/17/2018 at 12:11 PM  [facility]   Office  [phone]

## 2018-03-17 NOTE — Progress Notes (Signed)
Occupational Therapy Treatment Patient Details Name: Maria Avila MRN: [id number] DOB: 1950/11/26 Today's Date: 03/17/2018    History of present illness 67yo female coming from [facility] due to decreased responsiveness, found to by hypoxic and hypoglycemic, also in respiratory shock and respiratory failure with emphyema and concern for esophageal rupture. Intubated on 03/04/18, extubated on 03/09/18. Found to have B DVTs on 11/6 and now on heparin. CT positive for acute L parietal infarcts. PMH HTN, DM    OT comments  PATIENT WAS SEEN TO INCREASE I AND SAFETY WITH BED MOBILITY IN PREPARATION FOR ADLS. PATIENT WAS ABLE TO SCOOT UP IN BED WITH MAX A REQUIRED. PATIENT WAS MIN A WITH ROLLING R AND TO L WITH CUES TO USE R UE. PATIENT WAS MOD A WITH GROOMING TASKS AND WITH DONNING CLEAN GOWN. PATIENT PLANS TO DC TO CIR.   Follow Up Recommendations       Equipment Recommendations       Recommendations for Other Services      Precautions / Restrictions Precautions Precautions: Fall;Other (comment) Precaution Comments: flexiseal  Restrictions Weight Bearing Restrictions: No       Mobility Bed Mobility     Rolling: Min assist            Transfers                      Balance                                           ADL either performed or assessed with clinical judgement   ADL       Grooming: Wash/dry hands;Set up;Sitting           Upper Body Dressing : Moderate assistance                   Functional mobility during ADLs: (PATIENT WORKED ON SCOOTING UP ) General ADL Comments: PATIENT WAS ABL     Vision       Perception     Praxis      Cognition Arousal/Alertness: Awake/alert                       Orientation Level: Disoriented to;Place;Time;Situation           Problem Solving: Slow processing;Difficulty sequencing;Requires verbal cues;Requires tactile cues          Exercises      Shoulder Instructions       General Comments      Pertinent Vitals/ Pain       Pain Score: 4  Pain Location: buttocks Pain Descriptors / Indicators: Burning  Home Living                                          Prior Functioning/Environment              Frequency           Progress Toward Goals  OT Goals(current goals can now be found in the care plan section)  Progress towards OT goals: Progressing toward goals  Acute Rehab OT Goals Patient Stated Goal: GET STRONGER  Plan Discharge plan remains appropriate    Co-evaluation  AM-PAC PT "6 Clicks" Daily Activity     Outcome Measure   Help from another person eating meals?: A Lot Help from another person taking care of personal grooming?: A Lot Help from another person toileting, which includes using toliet, bedpan, or urinal?: Total Help from another person bathing (including washing, rinsing, drying)?: A Lot Help from another person to put on and taking off regular upper body clothing?: A Lot Help from another person to put on and taking off regular lower body clothing?: Total 6 Click Score: 10    End of Session        Activity Tolerance Patient tolerated treatment well   Patient Left in bed;with call bell/phone within reach;with family/visitor present   Nurse Communication Mobility status        Time: 7169-6789 OT Time Calculation (min): 28 min  Charges: OT General Charges $OT Visit: 1 Visit OT Treatments $Self Care/Home Management : 8-22 mins $Therapeutic Activity: 3-81 mins  6 CLICKS   Ismael Karge 03/17/2018, 2:13 PM

## 2018-03-17 NOTE — Progress Notes (Signed)
Physical Medicine and Rehabilitation Consult   Reason for Consult:  Functional deficits.  Referring Physician:  Dr. Waldron Labs.    HPI: Maria Avila is a 67 y.o. female with history fo T2DM, HTN, colitis with 6-12 months hx of N/V, left sided abdominal pain with progressive anorexia who was admitted on 02/16/18, underwent extensive negative workup and discharged to SNF for rehab. History taken from chart review. She was readmitted on 03/04/18 with mental status changes and hypoxemia with tachycardia. She was found to be septic with near complete opacification of lef chest due to empyema. She was intubated, left chest tube placed with purulent drainage and was started on IV antibiotics.  Dr. Roxan Hockey was consulted for input and recommended gastrograffin swallow to rule of esophageal perforation and felt that empyema likely due to aspiration PNA.  follow up CXR showed incomplete reexpansion and treated with tPA via chest tube for thrombolysis. She was started on TPN for nutritional support. Pleural fluid positive for E coli, enterococcus and pseudomonas aeruginosa and antibiotics narrowed to unasyn.  She was extubated on 11/5 and found to be lethargic with right sided weakness with left gaze preference and mumbling speech. Neurology consulted for input and CT perfusion done revealing left parietal lobe subacute infarct with pseudonormalized perfusion. CTA head/neck was negative for large vessel occlusion.   BLE dopplers were positive for acute DVT in right posterior tibial veins and left peroneal veins. she was started on IV heparin for treatment. TCD negative for HITs and no evidence of PFO. Dr. Erlinda Hong felt that stroke was embolic due to unknown source--endocarditis v/s hypercoagulable state due to sepsis.  CT tube removed on 11/9 as nonfunctional with recommendations to follow clinically for recurrence or respiratory deterioration. Has been transitioned to Xarelto and on dysphagia 1 diet, thins.   Therapy evaluations done revealing strong posterior lean when at EOB with visiospatial deficits, global weakness with tachycardia with activity and moderate cognitive linguistic deficits. CIR recommended by rehab team.   Review of Systems  Constitutional: Negative for chills and fever.  HENT: Negative for hearing loss and tinnitus.   Eyes: Negative for blurred vision and double vision.  Respiratory: Positive for shortness of breath. Negative for cough.   Cardiovascular: Negative for chest pain and palpitations.  Gastrointestinal: Negative for heartburn and nausea.       Poor appetite  Genitourinary: Negative for dysuria.  Musculoskeletal: Negative for myalgias.  Neurological: Positive for focal weakness. Negative for dizziness and headaches.  Psychiatric/Behavioral: Positive for memory loss.  All other systems reviewed and are negative.       Past Medical History:  Diagnosis Date  . Colitis   . Diabetes mellitus without complication ([place])   . Hypertension   . Malnutrition ([place])          Past Surgical History:  Procedure Laterality Date  . BIOPSY  02/26/2018   Procedure: BIOPSY;  Surgeon: Wilford Corner, MD;  Location: [place];  Service: Endoscopy;;  . COLONOSCOPY WITH PROPOFOL N/A 02/26/2018   Procedure: COLONOSCOPY WITH PROPOFOL;  Surgeon: Wilford Corner, MD;  Location: [place];  Service: Endoscopy;  Laterality: N/A;  . ESOPHAGOGASTRODUODENOSCOPY (EGD) WITH PROPOFOL N/A 02/26/2018   Procedure: ESOPHAGOGASTRODUODENOSCOPY (EGD) WITH PROPOFOL;  Surgeon: Wilford Corner, MD;  Location: [place];  Service: Endoscopy;  Laterality: N/A;  . NO PAST SURGERIES     UNCERTAIN OF NAME OF SURGERY         Family History  Problem Relation Age of Onset  . Alzheimer's disease Mother   .  Colon cancer Father     Social History:  Married.  reports that she has quit smoking. She has never used smokeless tobacco. She reports that she drank alcohol. She  reports that she has current or past drug history.         Allergies  Allergen Reactions  . Crestor [Rosuvastatin Calcium] Swelling and Other (See Comments)    Swelling of legs and muscle weakness  . Zocor [Simvastatin] Swelling and Other (See Comments)    Swelling of legs and muscle weakness          Medications Prior to Admission  Medication Sig Dispense Refill  . aspirin EC 81 MG tablet Take 81 mg by mouth daily.    . Cholecalciferol (VITAMIN D) 2000 units CAPS Take 2,000 Units by mouth daily.     . feeding supplement, ENSURE ENLIVE, (ENSURE ENLIVE) LIQD Take 237 mLs by mouth 3 (three) times daily between meals. 237 mL 12  . lipase/protease/amylase 24000-76000 units CPEP Take 1 capsule (24,000 Units total) by mouth 3 (three) times daily with meals. 270 capsule 1  . lisinopril (PRINIVIL,ZESTRIL) 10 MG tablet Take 10 mg by mouth daily.  3  . mirtazapine (REMERON) 7.5 MG tablet Take 1 tablet (7.5 mg total) by mouth at bedtime. 30 tablet 1  . Multiple Vitamin (MULTIVITAMIN) capsule Take 1 capsule by mouth daily.    . naproxen sodium (ALEVE) 220 MG tablet Take 220 mg by mouth 2 (two) times daily as needed.    . Omega-3 Fatty Acids (FISH OIL) 1000 MG CAPS Take 1,000 mg by mouth daily.     . ondansetron (ZOFRAN ODT) 4 MG disintegrating tablet Take 1 tablet (4 mg total) by mouth every 6 (six) hours as needed. (Patient taking differently: Take 4 mg by mouth every 6 (six) hours as needed for nausea or vomiting. ) 20 tablet 0  . pantoprazole (PROTONIX) 40 MG tablet Take 40 mg by mouth daily.    . pravastatin (PRAVACHOL) 40 MG tablet Take 40 mg by mouth every evening.  3  . promethazine (PHENERGAN) 12.5 MG tablet Take 12.5 mg by mouth every 6 (six) hours as needed for nausea.  1  . thiamine 100 MG tablet Take 1 tablet (100 mg total) by mouth daily. (Patient not taking: Reported on 03/04/2018) 30 tablet 0    Home: Home Living Family/patient expects to be discharged  to:: Private residence Living Arrangements: Spouse/significant other Available Help at Discharge: Family, Available 24 hours/day Type of Home: House Home Access: Stairs to enter Technical brewer of Steps: 3 Entrance Stairs-Rails: None Home Layout: One level Bathroom Shower/Tub: Multimedia programmer: Standard Home Equipment: None Additional Comments: per husband  Functional History: Prior Function Level of Independence: Needs assistance Gait / Transfers Assistance Needed: walked with husband's help household distances ADL's / Homemaking Assistance Needed: pt needing help with showering in the days leading up to this hospitalization, but typically independent Communication / Swallowing Assistance Needed: chart reports that family has noticed some recent slurring. Pt did not verbalize much during eval but was intelligible when she did Functional Status:  Mobility: Bed Mobility Overal bed mobility: Needs Assistance Bed Mobility: Supine to Sit, Sit to Supine Supine to sit: Max assist, +2 for physical assistance, +2 for safety/equipment Sit to supine: +2 for physical assistance, +2 for safety/equipment General bed mobility comments: MaxA+2 for supine to sit. strong posterior lean at EOB requiring fluctuating Mod-MaxA to maintain upright. Able to inconsistently correct posture. HR to 130BPM sitting EOB  Transfers General transfer comment: deferred due to strong posterior lean and fatigue, HR  Ambulation/Gait General Gait Details: deferred due to strong posterior lean and fatigue, HR   ADL: ADL General ADL Comments: pt requires total care, NPO  Cognition: Cognition Overall Cognitive Status: Impaired/Different from baseline Arousal/Alertness: Awake/alert Orientation Level: Oriented to person, Disoriented to place, Disoriented to time, Disoriented to situation Attention: Sustained Sustained Attention: Impaired Sustained Attention Impairment: Verbal basic Memory:  Impaired Memory Impairment: Storage deficit, Retrieval deficit, Decreased recall of new information Awareness: Impaired Awareness Impairment: Intellectual impairment, Emergent impairment Problem Solving: Impaired Problem Solving Impairment: Verbal basic Safety/Judgment: Impaired Cognition Arousal/Alertness: Awake/alert Behavior During Therapy: Flat affect Overall Cognitive Status: Impaired/Different from baseline Area of Impairment: Orientation, Attention, Memory, Following commands, Safety/judgement, Awareness, Problem solving Orientation Level: Disoriented to, Place, Time, Situation Current Attention Level: Focused Memory: Decreased short-term memory, Decreased recall of precautions Following Commands: Follows one step commands inconsistently, Follows one step commands with increased time Safety/Judgement: Decreased awareness of safety, Decreased awareness of deficits Awareness: Intellectual Problem Solving: Slow processing, Difficulty sequencing, Requires verbal cues, Requires tactile cues General Comments: fatigues very easily   Blood pressure 120/89, pulse (!) 103, temperature 98.3 F (36.8 C), temperature source Oral, resp. rate 20, height 5\' 2"  (1.575 m), weight 65.1 kg, SpO2 100 %. Physical Exam  Nursing note and vitals reviewed. Constitutional: She appears well-developed and well-nourished.  HENT:  Head: Normocephalic and atraumatic.  Eyes: EOM are normal. Right eye exhibits no discharge. Left eye exhibits no discharge.  Neck: Normal range of motion. Neck supple.  Cardiovascular: Regular rhythm. Tachycardia present.  Respiratory: Effort normal and breath sounds normal.  GI: Soft. Bowel sounds are normal. She exhibits no distension. There is no tenderness.  Genitourinary:  Genitourinary Comments: Rectal tube and foley in place.   Musculoskeletal:  No edema or tenderness in extremities  Neurological: She is alert.  Oriented to self and place but not to  situation--"stroke". Disoriented--unable to recall month or year without cues.  Lying in bed, slumped to the left.  Speech clear and able to follow simple one and two step motor commands.  Motor: LUE/LLE: 4+/5 proximal to distal RUE: 4-4+/5 proximal to distal RLE: 4/5 proximal to distal ?Right sided inattention  Skin: Skin is warm and dry.  Cream and Foam dressings noted on inner thighs.   Psychiatric: She has a normal mood and affect. Her behavior is normal.    LabResultsLast24Hours       Results for orders placed or performed during the hospital encounter of 03/04/18 (from the past 24 hour(s))  Glucose, capillary     Status: None   Collection Time: 03/14/18  9:14 AM  Result Value Ref Range   Glucose-Capillary 78 70 - 99 mg/dL  Glucose, capillary     Status: None   Collection Time: 03/14/18  4:32 PM  Result Value Ref Range   Glucose-Capillary 87 70 - 99 mg/dL  Glucose, capillary     Status: None   Collection Time: 03/14/18 11:48 PM  Result Value Ref Range   Glucose-Capillary 84 70 - 99 mg/dL  CBC     Status: Abnormal   Collection Time: 03/15/18  4:06 AM  Result Value Ref Range   WBC 14.1 (H) 4.0 - 10.5 K/uL   RBC 2.82 (L) 3.87 - 5.11 MIL/uL   Hemoglobin 8.5 (L) 12.0 - 15.0 g/dL   HCT 26.0 (L) 36.0 - 46.0 %   MCV 92.2 80.0 - 100.0 fL   MCH 30.1 26.0 -  34.0 pg   MCHC 32.7 30.0 - 36.0 g/dL   RDW 16.4 (H) 11.5 - 15.5 %   Platelets 276 150 - 400 K/uL   nRBC 0.0 0.0 - 0.2 %  Glucose, capillary     Status: Abnormal   Collection Time: 03/15/18  8:13 AM  Result Value Ref Range   Glucose-Capillary 58 (L) 70 - 99 mg/dL   Comment 1 Notify RN    Comment 2 Document in Chart       ImagingResults(Last48hours)  Dg Chest Port 1 View  Result Date: 03/14/2018 CLINICAL DATA:  Followup for pneumothorax. EXAM: PORTABLE CHEST 1 VIEW COMPARISON:  03/13/2018 and older studies. FINDINGS: No pneumothorax. There is left greater than right lung base opacity  consistent with a combination of atelectasis and pleural fluid, without significant change from the previous day's study. No new lung abnormalities. Left subclavian central venous line has been removed. IMPRESSION: 1. No pneumothorax. 2. Persistent, left greater than right lung base opacities consistent with combination of pleural fluid and atelectasis. 3. No new abnormalities.  No change from the most recent prior exam. Electronically Signed   By: Lajean Manes M.D.   On: 03/14/2018 08:05   Dg Chest Port 1 View  Result Date: 03/13/2018 CLINICAL DATA:  Status post chest tube removal. EXAM: PORTABLE CHEST 1 VIEW COMPARISON:  Chest CT, 03/12/2018.  Chest radiographs, 03/08/2018. FINDINGS: Status post left chest tube removal.  No pneumothorax. Persistent lung base opacities, left greater than right, consistent with a combination of pleural fluid and atelectasis. No new lung abnormalities. Left subclavian central venous line is stable and well positioned. IMPRESSION: 1. No pneumothorax following left chest tube removal. 2. Stable, left greater than right, lung base opacity consistent with pleural effusions with atelectasis. Electronically Signed   By: Lajean Manes M.D.   On: 03/13/2018 16:24     Assessment/Plan: Diagnosis: left parietal lobe with multi-medical issues Labs and images (see above) independently reviewed.  Records reviewed and summated above. Stroke: Continue secondary stroke prophylaxis and Risk Factor Modification listed below:   Antiplatelet therapy:   Blood Pressure Management:  Continue current medication with prn's with permisive HTN per primary team Statin Agent:   Diabetes management:   Right sided hemiparesis: fit for orthosis to prevent contractures (resting hand splint for day, wrist cock up splint at night, PRAFO, etc) Motor recovery: Fluoxetine  1. Does the need for close, 24 hr/day medical supervision in concert with the patient's rehab needs make it unreasonable for  this patient to be served in a less intensive setting? Yes  2. Co-Morbidities requiring supervision/potential complications: dysphagia (advance diet as tolerated), acute DVT (cont anticoagulation), T2 DM (Monitor in accordance with exercise and adjust meds as necessary), HTN (monitor and provide prns in accordance with increased physical exertion and pain), colitis, Tachycardia (monitor in accordance with pain and increasing activity), leukocytosis (repeat labs, cont to monitor for signs and symptoms of infection, further workup if indicated) 3. Due to bladder management, safety, skin/wound care, disease management, medication administration and patient education, does the patient require 24 hr/day rehab nursing? Yes 4. Does the patient require coordinated care of a physician, rehab nurse, PT (1-2 hrs/day, 5 days/week), OT (1-2 hrs/day, 5 days/week) and SLP (1-2 hrs/day, 5 days/week) to address physical and functional deficits in the context of the above medical diagnosis(es)? Yes Addressing deficits in the following areas: balance, endurance, locomotion, strength, transferring, bathing, dressing, toileting, cognition, language, swallowing and psychosocial support 5. Can the patient actively participate in  an intensive therapy program of at least 3 hrs of therapy per day at least 5 days per week? Potentially 6. The potential for patient to make measurable gains while on inpatient rehab is excellent 7. Anticipated functional outcomes upon discharge from inpatient rehab are min assist  with PT, min assist with OT, supervision with SLP. 8. Estimated rehab length of stay to reach the above functional goals is: 20-24 days. 9. Anticipated D/C setting: Home 10. Anticipated post D/C treatments: HH therapy and Home excercise program 11. Overall Rehab/Functional Prognosis: good  RECOMMENDATIONS: This patient's condition is appropriate for continued rehabilitative care in the following setting: CIR if adequate  caregiver support available once able to tolerate 3 hours of therapy/day. Patient has agreed to participate in recommended program. Potentially Note that insurance prior authorization may be required for reimbursement for recommended care.  Comment: Rehab Admissions Coordinator to follow up.   I have personally performed a face to face diagnostic evaluation, including, but not limited to relevant history and physical exam findings, of this patient and developed relevant assessment and plan.  Additionally, I have reviewed and concur with the physician assistant's documentation above.   Delice Lesch, MD, ABPMR Bary Leriche, PA-C 03/15/2018    Revision History                             Routing History

## 2018-03-17 NOTE — H&P (Signed)
Physical Medicine and Rehabilitation Admission H&P    Chief Complaint  Patient presents with  . Functional deficits    HPI:  Maria Avila is a 68 year old female with history of T2DM, HTN, history of heavy alcohol use,lymphocytosis/leucopenia, colitis with 6 month year history of N/V/diarrhea/anorexia with 40 lbs weigh loss and abdominal pain who was admitted on 02/16/18 wwith negative extensive work up and was discharged to SNF for rehab. History taken from chart review, patient and family. She was readmitted on 03/04/18 with mental status changes, hypoxia and tachycardia due to sepsis with near complete opacification of left chest due to empyema. She was intubated, left chest tube placed with drainage of purulent material and was started on IV antibiotics. Dr. Roxan Hockey recommended gastrografin swallow which was negative for perforation and he felt that empyema likely due to aspiration PNA. Brain MRI reviewed, showing mulitple CVAs. Per report, bilateral multiple CVA, greatest in left parietal lobe. Chest tube treated with tPA for thrombolysis and to help with reexpansion of lung and discontinued on 11/9 as non functional. . Culture positive for E coli, enterococcus and pseudomonas aeruginosa and antibiotics narrowed to Levaquin with stop date 11/21.    Review of Systems  Constitutional: Negative for chills and fever.  HENT: Negative for hearing loss and tinnitus.   Eyes: Negative for blurred vision and double vision.  Respiratory: Negative for cough and shortness of breath.   Cardiovascular: Positive for leg swelling. Negative for chest pain and palpitations.  Gastrointestinal: Positive for abdominal pain and diarrhea. Negative for heartburn.  Genitourinary: Positive for frequency and urgency.  Musculoskeletal: Positive for myalgias.  Skin: Negative for itching and rash.  Neurological: Positive for sensory change (numbness/tingling BLE to thighs.) and focal weakness. Negative for  dizziness and headaches.  Psychiatric/Behavioral: Positive for memory loss.  All other systems reviewed and are negative.    Past Medical History:  Diagnosis Date  . Colitis   . Diabetes mellitus without complication ([place])   . Hypertension   . Malnutrition ([place])     Past Surgical History:  Procedure Laterality Date  . BIOPSY  02/26/2018   Procedure: BIOPSY;  Surgeon: Wilford Corner, MD;  Location: [facility];  Service: Endoscopy;;  . COLONOSCOPY WITH PROPOFOL N/A 02/26/2018   Procedure: COLONOSCOPY WITH PROPOFOL;  Surgeon: Wilford Corner, MD;  Location: [facility];  Service: Endoscopy;  Laterality: N/A;  . ESOPHAGOGASTRODUODENOSCOPY (EGD) WITH PROPOFOL N/A 02/26/2018   Procedure: ESOPHAGOGASTRODUODENOSCOPY (EGD) WITH PROPOFOL;  Surgeon: Wilford Corner, MD;  Location: [facility];  Service: Endoscopy;  Laterality: N/A;  . NO PAST SURGERIES     UNCERTAIN OF NAME OF SURGERY    Family History  Problem Relation Age of Onset  . Alzheimer's disease Mother   . Colon cancer Father      Social History:  Married. 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  She reports that she has quit smoking years ago. She has never used smokeless tobacco. She  reports that she drank 3 shots of vodka/OJ till she started getting sick. Per reports that she has current or past drug history.   Allergies  Allergen Reactions  . Crestor [Rosuvastatin Calcium] Swelling and Other (See Comments)    Swelling of legs and muscle weakness  . Zocor [Simvastatin] Swelling and Other (See Comments)    Swelling of legs and muscle weakness    Medications Prior to Admission  Medication Sig Dispense Refill  . aspirin EC 81 MG tablet Take  81 mg by mouth daily.    . Cholecalciferol (VITAMIN D) 2000 units CAPS Take 2,000 Units by mouth daily.     . feeding supplement, ENSURE ENLIVE, (ENSURE ENLIVE) LIQD Take 237 mLs by mouth 3 (three) times daily between meals. 237 mL 12  .  lipase/protease/amylase 24000-76000 units CPEP Take 1 capsule (24,000 Units total) by mouth 3 (three) times daily with meals. 270 capsule 1  . lisinopril (PRINIVIL,ZESTRIL) 10 MG tablet Take 10 mg by mouth daily.  3  . mirtazapine (REMERON) 7.5 MG tablet Take 1 tablet (7.5 mg total) by mouth at bedtime. 30 tablet 1  . Multiple Vitamin (MULTIVITAMIN) capsule Take 1 capsule by mouth daily.    . naproxen sodium (ALEVE) 220 MG tablet Take 220 mg by mouth 2 (two) times daily as needed.    . Omega-3 Fatty Acids (FISH OIL) 1000 MG CAPS Take 1,000 mg by mouth daily.     . ondansetron (ZOFRAN ODT) 4 MG disintegrating tablet Take 1 tablet (4 mg total) by mouth every 6 (six) hours as needed. (Patient taking differently: Take 4 mg by mouth every 6 (six) hours as needed for nausea or vomiting. ) 20 tablet 0  . pantoprazole (PROTONIX) 40 MG tablet Take 40 mg by mouth daily.    . pravastatin (PRAVACHOL) 40 MG tablet Take 40 mg by mouth every evening.  3  . promethazine (PHENERGAN) 12.5 MG tablet Take 12.5 mg by mouth every 6 (six) hours as needed for nausea.  1  . thiamine 100 MG tablet Take 1 tablet (100 mg total) by mouth daily. (Patient not taking: Reported on 03/04/2018) 30 tablet 0    Drug Regimen Review  Drug regimen was reviewed and remains appropriate with no significant issues identified  Home: Home Living Family/patient expects to be discharged to:: Private residence Living Arrangements: Spouse/significant other Available Help at Discharge: Family, Available 24 hours/day Type of Home: House Home Access: Stairs to enter Technical brewer of Steps: 3 Entrance Stairs-Rails: None Home Layout: One level Bathroom Shower/Tub: Multimedia programmer: Standard Home Equipment: None Additional Comments: per husband   Functional History: Prior Function Level of Independence: Needs assistance Gait / Transfers Assistance Needed: walked with husband's help household distances ADL's /  Homemaking Assistance Needed: pt needing help with showering in the days leading up to this hospitalization, but typically independent Communication / Swallowing Assistance Needed: chart reports that family has noticed some recent slurring. Pt did not verbalize much during eval but was intelligible when she did  Functional Status:  Mobility: Bed Mobility Overal bed mobility: Needs Assistance Bed Mobility: Supine to Sit Supine to sit: +2 for physical assistance, Mod assist Sit to supine: +2 for physical assistance, +2 for safety/equipment General bed mobility comments: pt able to grasp R rail with L hand and intiate LE mvmt with vc's. Needed mod A +2 at hips and trunk for getting to EOB Transfers Overall transfer level: Needs assistance Equipment used: None Transfer via Lift Equipment: Stedy Transfers: Sit to/from Stand, Risk manager Sit to Stand: +2 safety/equipment, Max assist Stand pivot transfers: +2 physical assistance, Total assist(stedy) General transfer comment: pt was able to grasp stedy and pull for sit to stand but still needed max A +2 for power up and getting hips fwd enough to put flaps of stedy down. Had an easier time standing from elevated surface of stedy to sit to chair.  Ambulation/Gait General Gait Details:  unable due to fatigue and weakness    ADL: ADL  Overall ADL's : Needs assistance/impaired Lower Body Bathing: Minimal assistance, Sit to/from stand, +2 for safety/equipment, Maximal assistance, +2 for physical assistance, Cueing for safety, Cueing for sequencing Lower Body Bathing Details (indicate cue type and reason): While sitting at EOB, pt applying lotion to her legs with Min A for sitting balance and to facilitate normalize movement of RUE. Max A +2 for sit<>stand with stedy. Pt able to bend forward and able to self correct and bring trunk into upright position. Requiring Min A for posterior lean with fatigue Toilet Transfer: Maximal assistance, +2  for physical assistance, Cueing for safety, Cueing for sequencing(sit<>stand with stedy) Functional mobility during ADLs: Maximal assistance, +2 for physical assistance(sit<>stand with stedy) General ADL Comments: Pt with decreased balance, strength, cognition, and activity tolerance.  Cognition: Cognition Overall Cognitive Status: Impaired/Different from baseline Arousal/Alertness: Awake/alert Orientation Level: Oriented to person, Oriented to place, Disoriented to time, Disoriented to situation Attention: Sustained Sustained Attention: Impaired Sustained Attention Impairment: Verbal basic Memory: Impaired Memory Impairment: Storage deficit, Retrieval deficit, Decreased recall of new information Awareness: Impaired Awareness Impairment: Intellectual impairment, Emergent impairment Problem Solving: Impaired Problem Solving Impairment: Verbal basic Safety/Judgment: Impaired Cognition Arousal/Alertness: Awake/alert Behavior During Therapy: Flat affect Overall Cognitive Status: Impaired/Different from baseline Area of Impairment: Attention, Memory, Following commands, Safety/judgement, Awareness, Problem solving Orientation Level: Disoriented to, Place, Time, Situation Current Attention Level: Sustained Memory: Decreased short-term memory, Decreased recall of precautions Following Commands: Follows one step commands with increased time, Follows one step commands consistently Safety/Judgement: Decreased awareness of safety, Decreased awareness of deficits Awareness: Intellectual Problem Solving: Slow processing, Difficulty sequencing, Requires verbal cues, Requires tactile cues General Comments: fatigues very easily. Confusion noted during session when pt stated that the lotion bottle in her hand was burning. Pt requiring increased time and cues throughout session   Blood pressure (!) 143/92, pulse (!) 102, temperature 98.2 F (36.8 C), temperature source Oral, resp. rate 16, height 5'  2" (1.575 m), weight 65.1 kg, SpO2 100 %. Physical Exam  Nursing note and vitals reviewed. Constitutional: She appears well-developed and well-nourished.  HENT:  Head: Normocephalic and atraumatic.  Eyes: EOM are normal. Right eye exhibits no discharge. Left eye exhibits no discharge.  Neck: Normal range of motion. Neck supple.  Cardiovascular: Regular rhythm.  +Tachycardia  Respiratory: Effort normal and breath sounds normal.  GI: Soft. Bowel sounds are normal. There is no tenderness.  Musculoskeletal: She exhibits edema (1+ edema BLE to ankles. ).  Neurological: She is alert.  Poor postural reflexes with tendency to lean to the left. She was oriented to self and place. Today she was unable to state DOB with perseverative speech. Unable to state month or time of the year without max cues. Able to follow simple one step motor commands.   Skin: Skin is warm and dry.  Psychiatric:  Oriented to self and place Speech clear and able to follow simple one and two step motor commands.  Motor: LUE/LLE: 4+/5 proximal to distal RUE: 4-4+/5 proximal to distal RLE: 4/5 proximal to distal ?Right sided inattention     Results for orders placed or performed during the hospital encounter of 03/04/18 (from the past 48 hour(s))  Glucose, capillary     Status: None   Collection Time: 03/15/18  8:56 AM  Result Value Ref Range   Glucose-Capillary 88 70 - 99 mg/dL  Glucose, capillary     Status: None   Collection Time: 03/15/18  4:56 PM  Result Value Ref Range   Glucose-Capillary 99 70 -  99 mg/dL   Comment 1 Notify RN    Comment 2 Document in Chart   Vitamin B12     Status: Abnormal   Collection Time: 03/15/18  5:13 PM  Result Value Ref Range   Vitamin B-12 945 (H) 180 - 914 pg/mL    Comment: (NOTE) This assay is not validated for testing neonatal or myeloproliferative syndrome specimens for Vitamin B12 levels. Performed at [facility], [place] [address], [place], [place] [address]     Folate     Status: None   Collection Time: 03/15/18  5:13 PM  Result Value Ref Range   Folate 9.0 >5.9 ng/mL    Comment: Performed at [facility] [address], [place], [place] [address]  Glucose, capillary     Status: Abnormal   Collection Time: 03/15/18 11:56 PM  Result Value Ref Range   Glucose-Capillary 122 (H) 70 - 99 mg/dL   Comment 1 Notify RN    Comment 2 Document in Chart   CBC     Status: Abnormal   Collection Time: 03/16/18  3:27 AM  Result Value Ref Range   WBC 12.8 (H) 4.0 - 10.5 K/uL   RBC 2.86 (L) 3.87 - 5.11 MIL/uL   Hemoglobin 8.7 (L) 12.0 - 15.0 g/dL   HCT 26.8 (L) 36.0 - 46.0 %   MCV 93.7 80.0 - 100.0 fL   MCH 30.4 26.0 - 34.0 pg   MCHC 32.5 30.0 - 36.0 g/dL   RDW 16.5 (H) 11.5 - 15.5 %   Platelets 296 150 - 400 K/uL   nRBC 0.0 0.0 - 0.2 %    Comment: Performed at [facility], [place] [address], [place], [place] [address]  Basic metabolic panel     Status: Abnormal   Collection Time: 03/16/18  3:27 AM  Result Value Ref Range   Sodium 135 135 - 145 mmol/L   Potassium 3.3 (L) 3.5 - 5.1 mmol/L   Chloride 109 98 - 111 mmol/L   CO2 21 (L) 22 - 32 mmol/L   Glucose, Bld 91 70 - 99 mg/dL   BUN 8 8 - 23 mg/dL   Creatinine, Ser 0.49 0.44 - 1.00 mg/dL   Calcium 7.7 (L) 8.9 - 10.3 mg/dL   GFR calc non Af Amer >60 >60 mL/min   GFR calc Af Amer >60 >60 mL/min    Comment: (NOTE) The eGFR has been calculated using the CKD EPI equation. This calculation has not been validated in all clinical situations. eGFR's persistently <60 mL/min signify possible Chronic Kidney Disease.    Anion gap 5 5 - 15    Comment: Performed at [facility] [address], [place], [place] [address]  Glucose, capillary     Status: None   Collection Time: 03/16/18  8:06 AM  Result Value Ref Range   Glucose-Capillary 81 70 - 99 mg/dL  Glucose, capillary     Status: None   Collection Time: 03/16/18  4:40 PM  Result Value Ref Range   Glucose-Capillary 95 70 - 99 mg/dL   Glucose, capillary     Status: None   Collection Time: 03/17/18  1:32 AM  Result Value Ref Range   Glucose-Capillary 75 70 - 99 mg/dL  Glucose, capillary     Status: Abnormal   Collection Time: 03/17/18  7:43 AM  Result Value Ref Range   Glucose-Capillary 63 (L) 70 - 99 mg/dL   No results found.  Medical Problem List and Plan: 1.  Limitations in mobility, self-care, endurance secondary to L>R infarcts and multiple medical issues. 2.  BLE DVTs/Anticoagulation: Pharmaceutical: Other (comment)-on Eliquis 3. Pain Management: tylenol prn.  4. Mood: LCSW to follow for evaluation and support.  5. Neuropsych: This patient is not capable of making decisions on her own behalf. 6. Skin/Wound Care: Air mattress due to sacral breakdown. Pressure relief measures. Nutritional supplements and vitamins to help promote healing.  7. Fluids/Electrolytes/Nutrition: Off TNA. Continue dysphagia 3 diet.  8. Empyema: Continue Levaquin for 3 weeks--end date 11/21.  9. Malnutrition/Heavy alcohol use: Poor intake with 40 lbs weight loss. Prealbumin <5. Will check thiamine levels in am 10. Diarrhea: Add probiotic and fiber. Discontinue Ensure and add prostat.  Will remove rectal tube and check for C diff if diarrhe continues.  11. Hypokalemia: Likely due to nutritional deficits as well as diarrhea. Will add supplement and check labs in am.  12. Leucocytosis with neutropenia: Continue to monitor.    Post Admission Physician Evaluation: 1. Preadmission assessment reviewed and changes made below. 2. Functional deficits secondary  to L>R infarcts and multiple medical issues. 3. Patient is admitted to receive collaborative, interdisciplinary care between the physiatrist, rehab nursing staff, and therapy team. 4. Patient's level of medical complexity and substantial therapy needs in context of that medical necessity cannot be provided at a lesser intensity of care such as a SNF. 5. Patient has experienced  substantial functional loss from his/her baseline which was documented above under the "Functional History" and "Functional Status" headings.  Judging by the patient's diagnosis, physical exam, and functional history, the patient has potential for functional progress which will result in measurable gains while on inpatient rehab.  These gains will be of substantial and practical use upon discharge  in facilitating mobility and self-care at the household level. 20. Physiatrist will provide 24 hour management of medical needs as well as oversight of the therapy plan/treatment and provide guidance as appropriate regarding the interaction of the two. 7. 24 hour rehab nursing will assist with safety, disease management and patient education  and help integrate therapy concepts, techniques,education, etc. 8. PT will assess and treat for/with: Lower extremity strength, range of motion, stamina, balance, functional mobility, safety, adaptive techniques and equipment, coping skills, pain control, education. Goals are: Min/Mod A. 9. OT will assess and treat for/with: ADL's, functional mobility, safety, upper extremity strength, adaptive techniques and equipment, ego support, and community reintegration.   Goals are: Min/Mod A. Therapy may not proceed with showering this patient. 10. SLP will assess and treat for/with: swallowing, cognition.  Goals are: Mod I. 11. Case Management and Social Worker will assess and treat for psychological issues and discharge planning. 12. Team conference will be held weekly to assess progress toward goals and to determine barriers to discharge. 13. Patient will receive at least 3 hours of therapy per day at least 5 days per week. 14. ELOS: 22-27 days.       15. Prognosis:  good  I have personally performed a face to face diagnostic evaluation, including, but not limited to relevant history and physical exam findings, of this patient and developed relevant assessment and plan.   Additionally, I have reviewed and concur with the physician assistant's documentation above.  The patient's status has not changed. The original post admission physician evaluation remains appropriate, and any changes from the pre-admission screening or documentation from the acute chart are noted above.    Delice Lesch, MD, ABPMR [facility]  [place], PA-C 03/17/2018

## 2018-03-17 NOTE — Progress Notes (Deleted)
Physical Medicine and Rehabilitation Consult   Reason for Consult:  Functional deficits.  Referring Physician:  Dr. Waldron Labs.    HPI: Maria Avila is a 67 y.o. female with history fo T2DM, HTN, colitis with 6-12 months hx of N/V, left sided abdominal pain with progressive anorexia who was admitted on 02/16/18, underwent extensive negative workup and discharged to SNF for rehab. History taken from chart review. She was readmitted on 03/04/18 with mental status changes and hypoxemia with tachycardia. She was found to be septic with near complete opacification of lef chest due to empyema. She was intubated, left chest tube placed with purulent drainage and was started on IV antibiotics.  Dr. Roxan Hockey was consulted for input and recommended gastrograffin swallow to rule of esophageal perforation and felt that empyema likely due to aspiration PNA.  follow up CXR showed incomplete reexpansion and treated with tPA via chest tube for thrombolysis. She was started on TPN for nutritional support. Pleural fluid positive for E coli, enterococcus and pseudomonas aeruginosa and antibiotics narrowed to unasyn.  She was extubated on 11/5 and found to be lethargic with right sided weakness with left gaze preference and mumbling speech. Neurology consulted for input and CT perfusion done revealing left parietal lobe subacute infarct with pseudonormalized perfusion. CTA head/neck was negative for large vessel occlusion.   BLE dopplers were positive for acute DVT in right posterior tibial veins and left peroneal veins. she was started on IV heparin for treatment. TCD negative for HITs and no evidence of PFO. Dr. Erlinda Hong felt that stroke was embolic due to unknown source--endocarditis v/s hypercoagulable state due to sepsis.  CT tube removed on 11/9 as nonfunctional with recommendations to follow clinically for recurrence or respiratory deterioration. Has been transitioned to Xarelto and on dysphagia 1 diet, thins.   Therapy evaluations done revealing strong posterior lean when at EOB with visiospatial deficits, global weakness with tachycardia with activity and moderate cognitive linguistic deficits. CIR recommended by rehab team.   Review of Systems  Constitutional: Negative for chills and fever.  HENT: Negative for hearing loss and tinnitus.   Eyes: Negative for blurred vision and double vision.  Respiratory: Positive for shortness of breath. Negative for cough.   Cardiovascular: Negative for chest pain and palpitations.  Gastrointestinal: Negative for heartburn and nausea.       Poor appetite  Genitourinary: Negative for dysuria.  Musculoskeletal: Negative for myalgias.  Neurological: Positive for focal weakness. Negative for dizziness and headaches.  Psychiatric/Behavioral: Positive for memory loss.  All other systems reviewed and are negative.       Past Medical History:  Diagnosis Date  . Colitis   . Diabetes mellitus without complication ([place])   . Hypertension   . Malnutrition ([place])          Past Surgical History:  Procedure Laterality Date  . BIOPSY  02/26/2018   Procedure: BIOPSY;  Surgeon: Wilford Corner, MD;  Location: [facility];  Service: Endoscopy;;  . COLONOSCOPY WITH PROPOFOL N/A 02/26/2018   Procedure: COLONOSCOPY WITH PROPOFOL;  Surgeon: Wilford Corner, MD;  Location: [facility];  Service: Endoscopy;  Laterality: N/A;  . ESOPHAGOGASTRODUODENOSCOPY (EGD) WITH PROPOFOL N/A 02/26/2018   Procedure: ESOPHAGOGASTRODUODENOSCOPY (EGD) WITH PROPOFOL;  Surgeon: Wilford Corner, MD;  Location: [facility];  Service: Endoscopy;  Laterality: N/A;  . NO PAST SURGERIES     UNCERTAIN OF NAME OF SURGERY         Family History  Problem Relation Age of Onset  . Alzheimer's disease Mother   .  Colon cancer Father     Social History:  Married.  reports that she has quit smoking. She has never used smokeless tobacco. She reports that she drank alcohol. She  reports that she has current or past drug history.         Allergies  Allergen Reactions  . Crestor [Rosuvastatin Calcium] Swelling and Other (See Comments)    Swelling of legs and muscle weakness  . Zocor [Simvastatin] Swelling and Other (See Comments)    Swelling of legs and muscle weakness          Medications Prior to Admission  Medication Sig Dispense Refill  . aspirin EC 81 MG tablet Take 81 mg by mouth daily.    . Cholecalciferol (VITAMIN D) 2000 units CAPS Take 2,000 Units by mouth daily.     . feeding supplement, ENSURE ENLIVE, (ENSURE ENLIVE) LIQD Take 237 mLs by mouth 3 (three) times daily between meals. 237 mL 12  . lipase/protease/amylase 24000-76000 units CPEP Take 1 capsule (24,000 Units total) by mouth 3 (three) times daily with meals. 270 capsule 1  . lisinopril (PRINIVIL,ZESTRIL) 10 MG tablet Take 10 mg by mouth daily.  3  . mirtazapine (REMERON) 7.5 MG tablet Take 1 tablet (7.5 mg total) by mouth at bedtime. 30 tablet 1  . Multiple Vitamin (MULTIVITAMIN) capsule Take 1 capsule by mouth daily.    . naproxen sodium (ALEVE) 220 MG tablet Take 220 mg by mouth 2 (two) times daily as needed.    . Omega-3 Fatty Acids (FISH OIL) 1000 MG CAPS Take 1,000 mg by mouth daily.     . ondansetron (ZOFRAN ODT) 4 MG disintegrating tablet Take 1 tablet (4 mg total) by mouth every 6 (six) hours as needed. (Patient taking differently: Take 4 mg by mouth every 6 (six) hours as needed for nausea or vomiting. ) 20 tablet 0  . pantoprazole (PROTONIX) 40 MG tablet Take 40 mg by mouth daily.    . pravastatin (PRAVACHOL) 40 MG tablet Take 40 mg by mouth every evening.  3  . promethazine (PHENERGAN) 12.5 MG tablet Take 12.5 mg by mouth every 6 (six) hours as needed for nausea.  1  . thiamine 100 MG tablet Take 1 tablet (100 mg total) by mouth daily. (Patient not taking: Reported on 03/04/2018) 30 tablet 0    Home: Home Living Family/patient expects to be discharged  to:: Private residence Living Arrangements: Spouse/significant other Available Help at Discharge: Family, Available 24 hours/day Type of Home: House Home Access: Stairs to enter Technical brewer of Steps: 3 Entrance Stairs-Rails: None Home Layout: One level Bathroom Shower/Tub: Multimedia programmer: Standard Home Equipment: None Additional Comments: per husband  Functional History: Prior Function Level of Independence: Needs assistance Gait / Transfers Assistance Needed: walked with husband's help household distances ADL's / Homemaking Assistance Needed: pt needing help with showering in the days leading up to this hospitalization, but typically independent Communication / Swallowing Assistance Needed: chart reports that family has noticed some recent slurring. Pt did not verbalize much during eval but was intelligible when she did Functional Status:  Mobility: Bed Mobility Overal bed mobility: Needs Assistance Bed Mobility: Supine to Sit, Sit to Supine Supine to sit: Max assist, +2 for physical assistance, +2 for safety/equipment Sit to supine: +2 for physical assistance, +2 for safety/equipment General bed mobility comments: MaxA+2 for supine to sit. strong posterior lean at EOB requiring fluctuating Mod-MaxA to maintain upright. Able to inconsistently correct posture. HR to 130BPM sitting EOB  Transfers General transfer comment: deferred due to strong posterior lean and fatigue, HR  Ambulation/Gait General Gait Details: deferred due to strong posterior lean and fatigue, HR   ADL: ADL General ADL Comments: pt requires total care, NPO  Cognition: Cognition Overall Cognitive Status: Impaired/Different from baseline Arousal/Alertness: Awake/alert Orientation Level: Oriented to person, Disoriented to place, Disoriented to time, Disoriented to situation Attention: Sustained Sustained Attention: Impaired Sustained Attention Impairment: Verbal basic Memory:  Impaired Memory Impairment: Storage deficit, Retrieval deficit, Decreased recall of new information Awareness: Impaired Awareness Impairment: Intellectual impairment, Emergent impairment Problem Solving: Impaired Problem Solving Impairment: Verbal basic Safety/Judgment: Impaired Cognition Arousal/Alertness: Awake/alert Behavior During Therapy: Flat affect Overall Cognitive Status: Impaired/Different from baseline Area of Impairment: Orientation, Attention, Memory, Following commands, Safety/judgement, Awareness, Problem solving Orientation Level: Disoriented to, Place, Time, Situation Current Attention Level: Focused Memory: Decreased short-term memory, Decreased recall of precautions Following Commands: Follows one step commands inconsistently, Follows one step commands with increased time Safety/Judgement: Decreased awareness of safety, Decreased awareness of deficits Awareness: Intellectual Problem Solving: Slow processing, Difficulty sequencing, Requires verbal cues, Requires tactile cues General Comments: fatigues very easily   Blood pressure 120/89, pulse (!) 103, temperature 98.3 F (36.8 C), temperature source Oral, resp. rate 20, height 5\' 2"  (1.575 m), weight 65.1 kg, SpO2 100 %. Physical Exam  Nursing note and vitals reviewed. Constitutional: She appears well-developed and well-nourished.  HENT:  Head: Normocephalic and atraumatic.  Eyes: EOM are normal. Right eye exhibits no discharge. Left eye exhibits no discharge.  Neck: Normal range of motion. Neck supple.  Cardiovascular: Regular rhythm. Tachycardia present.  Respiratory: Effort normal and breath sounds normal.  GI: Soft. Bowel sounds are normal. She exhibits no distension. There is no tenderness.  Genitourinary:  Genitourinary Comments: Rectal tube and foley in place.   Musculoskeletal:  No edema or tenderness in extremities  Neurological: She is alert.  Oriented to self and place but not to  situation--"stroke". Disoriented--unable to recall month or year without cues.  Lying in bed, slumped to the left.  Speech clear and able to follow simple one and two step motor commands.  Motor: LUE/LLE: 4+/5 proximal to distal RUE: 4-4+/5 proximal to distal RLE: 4/5 proximal to distal ?Right sided inattention  Skin: Skin is warm and dry.  Cream and Foam dressings noted on inner thighs.   Psychiatric: She has a normal mood and affect. Her behavior is normal.    LabResultsLast24Hours       Results for orders placed or performed during the hospital encounter of 03/04/18 (from the past 24 hour(s))  Glucose, capillary     Status: None   Collection Time: 03/14/18  9:14 AM  Result Value Ref Range   Glucose-Capillary 78 70 - 99 mg/dL  Glucose, capillary     Status: None   Collection Time: 03/14/18  4:32 PM  Result Value Ref Range   Glucose-Capillary 87 70 - 99 mg/dL  Glucose, capillary     Status: None   Collection Time: 03/14/18 11:48 PM  Result Value Ref Range   Glucose-Capillary 84 70 - 99 mg/dL  CBC     Status: Abnormal   Collection Time: 03/15/18  4:06 AM  Result Value Ref Range   WBC 14.1 (H) 4.0 - 10.5 K/uL   RBC 2.82 (L) 3.87 - 5.11 MIL/uL   Hemoglobin 8.5 (L) 12.0 - 15.0 g/dL   HCT 26.0 (L) 36.0 - 46.0 %   MCV 92.2 80.0 - 100.0 fL   MCH 30.1 26.0 -  34.0 pg   MCHC 32.7 30.0 - 36.0 g/dL   RDW 16.4 (H) 11.5 - 15.5 %   Platelets 276 150 - 400 K/uL   nRBC 0.0 0.0 - 0.2 %  Glucose, capillary     Status: Abnormal   Collection Time: 03/15/18  8:13 AM  Result Value Ref Range   Glucose-Capillary 58 (L) 70 - 99 mg/dL   Comment 1 Notify RN    Comment 2 Document in Chart       ImagingResults(Last48hours)  Dg Chest Port 1 View  Result Date: 03/14/2018 CLINICAL DATA:  Followup for pneumothorax. EXAM: PORTABLE CHEST 1 VIEW COMPARISON:  03/13/2018 and older studies. FINDINGS: No pneumothorax. There is left greater than right lung base opacity  consistent with a combination of atelectasis and pleural fluid, without significant change from the previous day's study. No new lung abnormalities. Left subclavian central venous line has been removed. IMPRESSION: 1. No pneumothorax. 2. Persistent, left greater than right lung base opacities consistent with combination of pleural fluid and atelectasis. 3. No new abnormalities.  No change from the most recent prior exam. Electronically Signed   By: Lajean Manes M.D.   On: 03/14/2018 08:05   Dg Chest Port 1 View  Result Date: 03/13/2018 CLINICAL DATA:  Status post chest tube removal. EXAM: PORTABLE CHEST 1 VIEW COMPARISON:  Chest CT, 03/12/2018.  Chest radiographs, 03/08/2018. FINDINGS: Status post left chest tube removal.  No pneumothorax. Persistent lung base opacities, left greater than right, consistent with a combination of pleural fluid and atelectasis. No new lung abnormalities. Left subclavian central venous line is stable and well positioned. IMPRESSION: 1. No pneumothorax following left chest tube removal. 2. Stable, left greater than right, lung base opacity consistent with pleural effusions with atelectasis. Electronically Signed   By: Lajean Manes M.D.   On: 03/13/2018 16:24     Assessment/Plan: Diagnosis: left parietal lobe with multi-medical issues Labs and images (see above) independently reviewed.  Records reviewed and summated above. Stroke: Continue secondary stroke prophylaxis and Risk Factor Modification listed below:   Antiplatelet therapy:   Blood Pressure Management:  Continue current medication with prn's with permisive HTN per primary team Statin Agent:   Diabetes management:   Right sided hemiparesis: fit for orthosis to prevent contractures (resting hand splint for day, wrist cock up splint at night, PRAFO, etc) Motor recovery: Fluoxetine  1. Does the need for close, 24 hr/day medical supervision in concert with the patient's rehab needs make it unreasonable for  this patient to be served in a less intensive setting? Yes  2. Co-Morbidities requiring supervision/potential complications: dysphagia (advance diet as tolerated), acute DVT (cont anticoagulation), T2 DM (Monitor in accordance with exercise and adjust meds as necessary), HTN (monitor and provide prns in accordance with increased physical exertion and pain), colitis, Tachycardia (monitor in accordance with pain and increasing activity), leukocytosis (repeat labs, cont to monitor for signs and symptoms of infection, further workup if indicated) 3. Due to bladder management, safety, skin/wound care, disease management, medication administration and patient education, does the patient require 24 hr/day rehab nursing? Yes 4. Does the patient require coordinated care of a physician, rehab nurse, PT (1-2 hrs/day, 5 days/week), OT (1-2 hrs/day, 5 days/week) and SLP (1-2 hrs/day, 5 days/week) to address physical and functional deficits in the context of the above medical diagnosis(es)? Yes Addressing deficits in the following areas: balance, endurance, locomotion, strength, transferring, bathing, dressing, toileting, cognition, language, swallowing and psychosocial support 5. Can the patient actively participate in  an intensive therapy program of at least 3 hrs of therapy per day at least 5 days per week? Potentially 6. The potential for patient to make measurable gains while on inpatient rehab is excellent 7. Anticipated functional outcomes upon discharge from inpatient rehab are min assist  with PT, min assist with OT, supervision with SLP. 8. Estimated rehab length of stay to reach the above functional goals is: 20-24 days. 9. Anticipated D/C setting: Home 10. Anticipated post D/C treatments: HH therapy and Home excercise program 11. Overall Rehab/Functional Prognosis: good  RECOMMENDATIONS: This patient's condition is appropriate for continued rehabilitative care in the following setting: CIR if adequate  caregiver support available once able to tolerate 3 hours of therapy/day. Patient has agreed to participate in recommended program. Potentially Note that insurance prior authorization may be required for reimbursement for recommended care.  Comment: Rehab Admissions Coordinator to follow up.   I have personally performed a face to face diagnostic evaluation, including, but not limited to relevant history and physical exam findings, of this patient and developed relevant assessment and plan.  Additionally, I have reviewed and concur with the physician assistant's documentation above.   Delice Lesch, MD, ABPMR Bary Leriche, PA-C 03/15/2018    Revision History                             Routing History

## 2018-03-18 ENCOUNTER — Inpatient Hospital Stay (HOSPITAL_COMMUNITY): Payer: Self-pay

## 2018-03-18 ENCOUNTER — Inpatient Hospital Stay (HOSPITAL_COMMUNITY): Payer: Self-pay | Admitting: Occupational Therapy

## 2018-03-18 ENCOUNTER — Inpatient Hospital Stay (HOSPITAL_COMMUNITY): Payer: Self-pay | Admitting: Speech Pathology

## 2018-03-18 DIAGNOSIS — I6349 Cerebral infarction due to embolism of other cerebral artery: Secondary | ICD-10-CM

## 2018-03-18 DIAGNOSIS — R197 Diarrhea, unspecified: Secondary | ICD-10-CM

## 2018-03-18 DIAGNOSIS — E43 Unspecified severe protein-calorie malnutrition: Secondary | ICD-10-CM

## 2018-03-18 LAB — COMPREHENSIVE METABOLIC PANEL
ALBUMIN: 1.5 g/dL — AB (ref 3.5–5.0)
ALT: 47 U/L — AB (ref 0–44)
ANION GAP: 3 — AB (ref 5–15)
AST: 38 U/L (ref 15–41)
Alkaline Phosphatase: 142 U/L — ABNORMAL HIGH (ref 38–126)
BILIRUBIN TOTAL: 0.5 mg/dL (ref 0.3–1.2)
BUN: 7 mg/dL — ABNORMAL LOW (ref 8–23)
CHLORIDE: 112 mmol/L — AB (ref 98–111)
CO2: 24 mmol/L (ref 22–32)
Calcium: 8.3 mg/dL — ABNORMAL LOW (ref 8.9–10.3)
Creatinine, Ser: 0.49 mg/dL (ref 0.44–1.00)
GFR calc Af Amer: >60 mL/min (ref >60)
GFR calc non Af Amer: >60 mL/min (ref >60)
GLUCOSE: 89 mg/dL (ref 70–99)
POTASSIUM: 3.2 mmol/L — AB (ref 3.5–5.1)
Sodium: 139 mmol/L (ref 135–145)
TOTAL PROTEIN: 5.8 g/dL — AB (ref 6.5–8.1)

## 2018-03-18 LAB — CBC WITH DIFFERENTIAL/PLATELET
ABS IMMATURE GRANULOCYTES: 0.07 10*3/uL (ref 0.00–0.07)
BASOS ABS: 0 10*3/uL (ref 0.0–0.1)
Basophils Relative: 0 %
EOS ABS: 0 10*3/uL (ref 0.0–0.5)
Eosinophils Relative: 0 %
HCT: 30.1 % — ABNORMAL LOW (ref 36.0–46.0)
HEMOGLOBIN: 9.6 g/dL — AB (ref 12.0–15.0)
IMMATURE GRANULOCYTES: 1 %
LYMPHS ABS: 3.1 10*3/uL (ref 0.7–4.0)
LYMPHS PCT: 28 %
MCH: 30.4 pg (ref 26.0–34.0)
MCHC: 31.9 g/dL (ref 30.0–36.0)
MCV: 95.3 fL (ref 80.0–100.0)
MONOS PCT: 9 %
Monocytes Absolute: 1 10*3/uL (ref 0.1–1.0)
NEUTROS ABS: 7 10*3/uL (ref 1.7–7.7)
NRBC: 0 % (ref 0.0–0.2)
Neutrophils Relative %: 62 %
Platelets: 351 10*3/uL (ref 150–400)
RBC: 3.16 MIL/uL — ABNORMAL LOW (ref 3.87–5.11)
RDW: 17.2 % — AB (ref 11.5–15.5)
WBC: 11.1 10*3/uL — ABNORMAL HIGH (ref 4.0–10.5)

## 2018-03-18 LAB — GLUCOSE, CAPILLARY
GLUCOSE-CAPILLARY: 78 mg/dL (ref 70–99)
Glucose-Capillary: 84 mg/dL (ref 70–99)
Glucose-Capillary: 105 mg/dL — ABNORMAL HIGH (ref 70–99)

## 2018-03-18 LAB — PREALBUMIN: Prealbumin: 9.7 mg/dL — ABNORMAL LOW (ref 18–38)

## 2018-03-18 MED ORDER — LOPERAMIDE HCL 2 MG PO CAPS
2.0000 mg | ORAL_CAPSULE | ORAL | Status: DC | PRN
  Administered 2018-03-20 – 2018-03-22 (×4): 2 mg via ORAL
  Filled 2018-03-18 (×4): qty 1

## 2018-03-18 MED ORDER — FAMOTIDINE 10 MG PO TABS
10.0000 mg | ORAL_TABLET | Freq: Two times a day (BID) | ORAL | Status: DC
  Administered 2018-03-18 – 2018-04-09 (×43): 10 mg via ORAL
  Filled 2018-03-18 (×45): qty 1

## 2018-03-18 MED ORDER — LIDOCAINE HCL URETHRAL/MUCOSAL 2 % EX GEL
CUTANEOUS | Status: DC | PRN
  Administered 2018-04-08: 5 via TOPICAL
  Filled 2018-03-18 (×2): qty 5

## 2018-03-18 NOTE — Progress Notes (Signed)
[facility] PHYSICAL MEDICINE & REHABILITATION PROGRESS NOTE   Subjective/Complaints: Up in chair. Eating breakfast. Didn't sleep real well overnight. Having loose stools.    Objective:   No results found. Recent Labs    03/16/18 0327  WBC 12.8*  HGB 8.7*  HCT 26.8*  PLT 296   Recent Labs    03/16/18 0327  NA 135  K 3.3*  CL 109  CO2 21*  GLUCOSE 91  BUN 8  CREATININE 0.49  CALCIUM 7.7*    Intake/Output Summary (Last 24 hours) at 03/18/2018 1041 Last data filed at 03/18/2018 0900 Gross per 24 hour  Intake 300 ml  Output 1060 ml  Net -760 ml     Physical Exam: Vital Signs Blood pressure (!) 136/94, pulse 98, temperature 97.8 F (36.6 C), temperature source Oral, resp. rate 17, SpO2 100 %. Constitutional: She appears well-developed and well-nourished.  Constitutional: No distress . Vital signs reviewed. HEENT: EOMI, oral membranes moist Neck: supple Cardiovascular: tachy without murmur. No JVD    Respiratory: CTA Bilaterally without wheezes or rales. Normal effort    GI: BS +, non-tender, non-distended  Musculoskeletal: She exhibits edema (1+ edema BLE to ankles. ).  Neurological: She is alert. Oriented toself-hospital.  Leans to left. Delayed processing. LUE and LLE 4+/5. RUE and RLE 4- to 4/5. Mild right inattention.      Skin: Skin is warm and dry.  Psychiatric:         Assessment/Plan: 1. 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  Physiatrist is providing close team supervision and 24 hour management of active medical problems listed below.  Physiatrist and rehab team continue to assess barriers to discharge/monitor patient progress toward functional and medical goals  Care Tool:  Bathing              Bathing assist       Upper Body Dressing/Undressing Upper body dressing        Upper body assist      Lower  Body Dressing/Undressing Lower body dressing            Lower body assist       Toileting Toileting    Toileting assist Assist for toileting: Dependent - Patient 0%     Transfers Chair/bed transfer  Transfers assist     Chair/bed transfer assist level: Dependent - mechanical lift     Locomotion Ambulation   Ambulation assist              Walk 10 feet activity   Assist           Walk 50 feet activity   Assist           Walk 150 feet activity   Assist           Walk 10 feet on uneven surface  activity   Assist           Wheelchair     Assist               Wheelchair 50 feet with 2 turns activity    Assist            Wheelchair 150 feet activity     Assist           Medical Problem List and Plan: 1.  Limitations in mobility, self-care, endurance secondary to L>R infarcts and multiple medical issues.  -beginning therapies today  2.  BLE DVTs/Anticoagulation: Pharmaceutical: Other (comment)-on Eliquis 3. Pain Management: tylenol prn.  4. Mood: LCSW to follow for evaluation and support.  5. Neuropsych: This patient is not capable of making decisions on her own behalf. 6. Skin/Wound Care: Air mattress due to sacral breakdown.   -nutrition/local skin care 7. Fluids/Electrolytes/Nutrition: Off TNA. Continue dysphagia 3 diet.  8. Empyema: Continue Levaquin for 3 weeks--end date 11/21.  9. Malnutrition/Heavy alcohol use: Poor intake with 40 lbs weight loss.   -Prealbumin <5.    -thiamine levels pending today 10. Diarrhea: Added probiotic and fiber. Discontinued Ensure and added prostat.    -rectal tube out  -stool seems to be trending towards more formed  -no indications that it's infectious although WBC sl elevated  -continue with plan. Add imodium as well 11. Hypokalemia: Likely due to nutritional deficits as well as diarrhea.   -await labs today 12. Leucocytosis with neutropenia: Continue to  monitor.     LOS: 1 days A FACE TO [facility] 03/18/2018, 10:41 AM

## 2018-03-18 NOTE — Care Management (Signed)
Inpatient Rehabilitation Center Individual Statement of Services  Patient Name:  Maria Avila  Date:  03/18/2018  Welcome to the [facility].  Our goal is to provide you with an individualized program based on your diagnosis and situation, designed to meet your specific needs.  With this comprehensive rehabilitation program, you will be expected to participate in at least 3 hours of rehabilitation therapies Monday-Friday, with modified therapy programming on the weekends.  Your rehabilitation program will include the following services:  Physical Therapy (PT), Occupational Therapy (OT), Speech Therapy (ST), 24 hour per day rehabilitation nursing, Therapeutic Recreaction (TR), Neuropsychology, Case Management (Social Worker), Rehabilitation Medicine, Nutrition Services and Pharmacy Services  Weekly team conferences will be held on Wednesday to discuss your progress.  Your Social Worker will talk with you frequently to get your input and to update you on team discussions.  Team conferences with you and your family in attendance may also be held.  Expected length of stay: 21-25 days  Overall anticipated outcome: supervision with cueing and min assist with ADL's  Depending on your progress and recovery, your program may change. Your Social Worker will coordinate services and will keep you informed of any changes. Your Social Worker's name and contact numbers are listed  below.  The following services may also be recommended but are not provided by the [facility] will be made to provide these services after discharge if needed.  Arrangements include referral to agencies that provide these services.  Your insurance has been verified to be:  [employer]-Medicare Your primary doctor is:  [name]  Pertinent information will be shared with your  doctor and your insurance company.  Social Worker:  Ovidio Kin, [place] or ([phone]  Information discussed with and copy given to patient by: Elease Hashimoto, 03/18/2018, 11:51 AM

## 2018-03-18 NOTE — Progress Notes (Signed)
Patient has been incontinent multiple times this am--was scanned for >999 cc. Incontinence due to overflow --will start toileting program with I/O cath every 4-6 hours to keep volumes < 350 cc.   Bulking agents added yesterday to help manage loose stools. MASD buttocks treated with santyl and pressure relief measures. Protein supplements and vitamins added to help promote healing.

## 2018-03-18 NOTE — Progress Notes (Signed)
Calorie Count Note  48-hour calorie count ordered and started 11/13 at dinner meal. Calorie count to run through lunch on 11/15.  Please see full assessment note to follow.  Diet: Dysphagia 3 with thin liquids  Supplements: - Ensure Enlive po TID, each supplement provides 350 kcal and 20 grams of protein (noted this was d/c today by RN - recommend continuing this) - Pro-stat BID, each supplement provides 100 kcals and 15 grams protein  03/17/18: Dinner: 193 kcal, 11 grams of protein Supplements: 1 Ensure Enlive (350 kcal, 20 grams of protein), 1 Pro-stat (100 kcal, 15 grams of protein)  03/18/18: Breakfast: 460 kcal, 18 grams of protein Lunch: 346 kcal, 12 grams of protein Supplements: 1 Ensure Enlive per pt report (350 kcal, 20 grams of protein), 1 Pro-stat (100 kcal, 15 grams of protein)  Total 24-hour intake: 1899 kcal (100% of minimum estimated needs)  101 grams of protein (100% of minimum estimated needs)  Nutrition Diagnosis: Moderate Malnutrition related to chronic illness (chronic colitis) as evidenced by mild muscle depletion, moderate muscle depletion, mild fat depletion, moderate fat depletion.  Goal: Patient will meet greater than or equal to 90% of their needs  Intervention: - Prostat BID, each supplement provides 100 kcals and 15 grams protein.  - Obtain new weight - Recommend restarting Ensure Enlive either BID or TID (d/c today by RN) - Encourage adequate PO intake   Gaynell Face, MS, RD, LDN Inpatient Clinical Dietitian Pager: [phone] Weekend/After Hours: [phone]

## 2018-03-18 NOTE — Evaluation (Signed)
Occupational Therapy Assessment and Plan  Patient Details  Name: Maria Avila MRN: [id number] Date of Birth: 09/22/1950  OT Diagnosis: abnormal posture, apraxia, cognitive deficits, hemiplegia affecting dominant side, muscular wasting and disuse atrophy and muscle weakness (generalized) Rehab Potential: Rehab Potential (ACUTE ONLY): Fair ELOS: 24-27 days   Today's Date: 03/18/2018 OT Individual Time: 1305-1403 OT Individual Time Calculation (min): 58 min     Problem List:  Patient Active Problem List   Diagnosis Date Noted  . Stroke due to embolism ([place]) 03/17/2018  . Hypokalemia   . ETOH abuse   . Dysphagia, post-stroke   . Benign essential HTN   . Acute deep vein thrombosis (DVT) of distal vein of both lower extremities (HCC)   . Sinus tachycardia   . Chest tube in place   . Esophageal perforation   . Perforation esophagus   . Sepsis with acute renal failure ([place])   . Acute deep vein thrombosis (DVT) of both peroneal veins   . Leukocytosis   . Sepsis due to Pseudomonas species ([place])   . Cerebral embolism with cerebral infarction 03/09/2018  . Malnutrition of moderate degree 03/08/2018  . Empyema ([place]) 03/04/2018  . Pressure injury of skin 03/04/2018  . Acute respiratory failure with hypercapnia ([place]) 03/04/2018  . Diarrhea 02/26/2018  . Pancytopenia ([place]) 02/19/2018  . Hypotension 02/19/2018  . Severe protein-calorie malnutrition ([place]) 02/17/2018  . Colitis 02/16/2018  . Weakness 02/16/2018  . Nausea vomiting and diarrhea 02/16/2018  . Diabetes ([place]) 02/16/2018  . Essential hypertension 02/16/2018  . Anemia 02/16/2018    Past Medical History:  Past Medical History:  Diagnosis Date  . Colitis   . Diabetes mellitus without complication ([place])   . Hypertension   . Malnutrition ([place])    Past Surgical History:  Past Surgical History:  Procedure Laterality Date  . BIOPSY  02/26/2018   Procedure: BIOPSY;  Surgeon: Wilford Corner, MD;  Location: [facility];   Service: Endoscopy;;  . COLONOSCOPY WITH PROPOFOL N/A 02/26/2018   Procedure: COLONOSCOPY WITH PROPOFOL;  Surgeon: Wilford Corner, MD;  Location: [facility];  Service: Endoscopy;  Laterality: N/A;  . ESOPHAGOGASTRODUODENOSCOPY (EGD) WITH PROPOFOL N/A 02/26/2018   Procedure: ESOPHAGOGASTRODUODENOSCOPY (EGD) WITH PROPOFOL;  Surgeon: Wilford Corner, MD;  Location: [facility];  Service: Endoscopy;  Laterality: N/A;  . NO PAST SURGERIES     UNCERTAIN OF NAME OF SURGERY    Assessment & Plan Clinical Impression: Patient is a 67 y.o. history of T2DM, HTN, history of heavy alcohol use,lymphocytosis/leucopenia, colitis with 6 month year history of N/V/diarrhea/anorexia with 40 lbs weigh loss and abdominal pain who was admitted on 02/16/18 wwith negative extensive work up and was discharged to SNF for rehab. History taken from chart review, patient and family. She was readmitted on 03/04/18 with mental status changes, hypoxia and tachycardia due to sepsis with near complete opacification of left chest due to empyema. She was intubated, left chest tube placed with drainage of purulent material and was started on IV antibiotics. Dr. Roxan Hockey recommended gastrografin swallow which was negative for perforation and he felt that empyema likely due to aspiration PNA. Brain MRI reviewed, showing mulitple CVAs. Per report, bilateral multiple CVA, greatest in left parietal lobe. Chest tube treated with tPA for thrombolysis and to help with reexpansion of lung and discontinued on 11/9 as non functional. . Culture positive for E coli, enterococcus and pseudomonas aeruginosa and antibiotics narrowed to Levaquin with stop date 11/21.   Patient transferred to CIR on 03/17/2018 .  Patient currently requires total +2 with basic self-care skills secondary to muscle weakness, decreased cardiorespiratoy endurance, impaired timing and sequencing, unbalanced muscle activation, motor apraxia, decreased coordination and  decreased motor planning, decreased attention, decreased awareness, decreased problem solving, decreased safety awareness and decreased memory and decreased sitting balance, decreased standing balance, hemiplegia and decreased balance strategies.  Prior to hospitalization, patient could complete ADLs with independent .  Patient will benefit from skilled intervention to decrease level of assist with basic self-care skills prior to discharge home with care partner.  Anticipate patient will require 24 hour supervision and minimal physical assistance and follow up home health.  OT - End of Session Activity Tolerance: Tolerates 30+ min activity with multiple rests Endurance Deficit: Yes Endurance Deficit Description: generalized weakness, deconditioning OT Assessment Rehab Potential (ACUTE ONLY): Fair OT Barriers to Discharge: Home environment access/layout OT Barriers to Discharge Comments: has 4 steps to enter with no rails OT Patient demonstrates impairments in the following area(s): Balance;Cognition;Endurance;Motor;Nutrition;Pain;Perception;Safety;Skin Integrity;Vision OT Basic ADL's Functional Problem(s): Grooming;Bathing;Toileting;Dressing;Eating OT Transfers Functional Problem(s): Toilet;Tub/Shower OT Plan OT Intensity: Minimum of 1-2 x/day, 45 to 90 minutes OT Frequency: 5 out of 7 days OT Duration/Estimated Length of Stay: 24-27 days OT Treatment/Interventions: Balance/vestibular training;Cognitive remediation/compensation;Discharge planning;Disease mangement/prevention;DME/adaptive equipment instruction;Functional mobility training;Neuromuscular re-education;Pain management;Patient/family education;Psychosocial support;Self Care/advanced ADL retraining;Skin care/wound managment;Splinting/orthotics;Therapeutic Activities;Therapeutic Exercise;UE/LE Strength taining/ROM;UE/LE Coordination activities;Visual/perceptual remediation/compensation;Wheelchair propulsion/positioning OT Self Feeding  Anticipated Outcome(s): Supervision/setup OT Basic Self-Care Anticipated Outcome(s): Min -mod assist OT Toileting Anticipated Outcome(s): Min assist OT Bathroom Transfers Anticipated Outcome(s): Min assist OT Recommendation Recommendations for Other Services: Neuropsych consult;Therapeutic Recreation consult Patient destination: Home Follow Up Recommendations: Home health OT;24 hour supervision/assistance Equipment Recommended: 3 in 1 bedside comode;Tub/shower bench   Skilled Therapeutic Intervention OT eval completed with discussion of rehab process, OT purpose, POC, ELOS, and goals.  ADL assessment completed with LB dressing at bed level requiring +2 for bed mobility due to impaired motor planning with rolling and sequencing of self-care tasks.  Completed sidelying to sitting with max assist.  Pt able to maintain sitting balance at EOB with min guard.  Utilized Stedy for sit > stand and transfer to w/c.  Attempted sit > stand without lift, however unable to complete even with +2 assist due to extreme BLE weakness.  Pt engaged in self-feeding task seated in w/c with good use of BUE to obtain and hold food, required assistance to open containers.  Mild overshooting when reaching for tab to open ice cream - question visual impairment vs motor impersistence.  OT Evaluation Precautions/Restrictions  Precautions Precautions: Fall Restrictions Weight Bearing Restrictions: No General   Vital Signs Therapy Vitals Temp: (!) 97.5 F (36.4 C) Pulse Rate: (!) 103 Resp: 17 BP: (!) 124/91 Patient Position (if appropriate): Sitting Oxygen Therapy SpO2: 99 % O2 Device: Room Air Pain Pain Assessment Pain Scale: 0-10 Pain Score: 0-No pain Home Living/Prior Functioning Home Living Family/patient expects to be discharged to:: Private residence Living Arrangements: Spouse/significant other Available Help at Discharge: Family, Available 24 hours/day Type of Home: House Home Access: Stairs to  enter Technical brewer of Steps: 3-4 Entrance Stairs-Rails: None Home Layout: One level Bathroom Shower/Tub: Gaffer, Tub/shower unit(typically used walk-in shower) Biochemist, clinical: Standard  Lives With: Spouse, Family IADL History Homemaking Responsibilities: Yes Meal Prep Responsibility: Secondary Laundry Responsibility: Secondary Cleaning Responsibility: Secondary Education: finished 10th grade Prior Function Level of Independence: Independent with basic ADLs, Independent with homemaking with ambulation, Independent with gait  Able to Take Stairs?: Yes Driving: Yes Vocation: Unemployed(recently laid-off)  ADL ADL Eating: Set up, Minimal cueing Where Assessed-Eating: Wheelchair Upper Body Bathing: Moderate assistance Where Assessed-Upper Body Bathing: Sitting at sink, Wheelchair Lower Body Bathing: Dependent Where Assessed-Lower Body Bathing: Bed level Upper Body Dressing: Moderate assistance Where Assessed-Upper Body Dressing: Wheelchair Lower Body Dressing: Dependent(+2) Where Assessed-Lower Body Dressing: Bed level Toilet Transfer: Dependent Toilet Transfer Method: Other (comment)(Stedy) Toilet Transfer Equipment: Other (comment)(Stedy) Vision Baseline Vision/History: Wears glasses Wears Glasses: At all times Patient Visual Report: No change from baseline Vision Assessment?: Vision impaired- to be further tested in functional context Additional Comments: needs further assessment Cognition Overall Cognitive Status: Impaired/Different from baseline Arousal/Alertness: Awake/alert Orientation Level: Person;Place;Situation Person: Oriented Place: Oriented Situation: Oriented Year: 2019 Month: October Day of Week: Correct Memory: Impaired Memory Impairment: Storage deficit;Retrieval deficit;Decreased recall of new information Immediate Memory Recall: Sock;Blue;Bed Memory Recall: Sock Memory Recall Sock: Without Cue Attention: Sustained Sustained  Attention: Impaired Sustained Attention Impairment: Verbal basic;Functional basic Awareness: Impaired Awareness Impairment: Intellectual impairment;Emergent impairment;Anticipatory impairment Problem Solving: Impaired Problem Solving Impairment: Verbal basic;Functional basic Executive Function: (All areas impacted by lower level deficits) Safety/Judgment: Impaired Sensation Sensation Light Touch: Appears Intact Proprioception: Appears Intact Coordination Gross Motor Movements are Fluid and Coordinated: No Fine Motor Movements are Fluid and Coordinated: No Mobility  Bed Mobility Bed Mobility: Rolling Right;Rolling Left;Left Sidelying to Sit Rolling Right: 2 Helpers Rolling Left: 2 Helpers Left Sidelying to Sit: Maximal Assistance - Patient 25-49% Transfers Sit to Stand: Dependent - mechanical lift  Extremity/Trunk Assessment RUE Assessment RUE Assessment: Exceptions to [facility] Active Range of Motion (AROM) Comments: shoulder flexion limited to 60* with use of abduction to achieve even 60*, elbow and wrist [facility] General Strength Comments: bicep/tricep 4/5, loose gross grasp LUE Assessment LUE Assessment: Exceptions to [facility] Active Range of Motion (AROM) Comments: shoulder flexion limited to 80*, elbow [facility] General Strength Comments: grossly 4/5, Lt grasp > Rt     Refer to Care Plan for Long Term Goals  Recommendations for other services: Neuropsych and Therapeutic Recreation  Other coping/support   Discharge Criteria: Patient will be discharged from OT if patient refuses treatment 3 consecutive times without medical reason, if treatment goals not met, if there is a change in medical status, if patient makes no progress towards goals or if patient is discharged from hospital.  The above assessment, treatment plan, treatment alternatives and goals were discussed and mutually agreed upon: by patient and by family  Ellwood Dense [facility] 03/18/2018, 4:10 PM

## 2018-03-18 NOTE — Evaluation (Signed)
Physical Therapy Assessment and Plan  Patient Details  Name: Maria Avila MRN: [id number] Date of Birth: 1951-03-05  PT Diagnosis: Abnormal posture, Abnormality of gait, Cognitive deficits, Dizziness and giddiness, Edema, Hemiparesis dominant, Muscle weakness and Pain in sacrum due to DQ Rehab Potential: Good ELOS: 21-24 days   Today's Date: 03/18/2018 PT Individual Time: 1445-1600 PT Individual Time Calculation (min): 75 min    Problem List:  Patient Active Problem List   Diagnosis Date Noted  . Stroke due to embolism ([place]) 03/17/2018  . Hypokalemia   . ETOH abuse   . Dysphagia, post-stroke   . Benign essential HTN   . Acute deep vein thrombosis (DVT) of distal vein of both lower extremities (HCC)   . Sinus tachycardia   . Chest tube in place   . Esophageal perforation   . Perforation esophagus   . Sepsis with acute renal failure ([place])   . Acute deep vein thrombosis (DVT) of both peroneal veins   . Leukocytosis   . Sepsis due to Pseudomonas species ([place])   . Cerebral embolism with cerebral infarction 03/09/2018  . Malnutrition of moderate degree 03/08/2018  . Empyema ([place]) 03/04/2018  . Pressure injury of skin 03/04/2018  . Acute respiratory failure with hypercapnia ([place]) 03/04/2018  . Diarrhea 02/26/2018  . Pancytopenia ([place]) 02/19/2018  . Hypotension 02/19/2018  . Severe protein-calorie malnutrition ([place]) 02/17/2018  . Colitis 02/16/2018  . Weakness 02/16/2018  . Nausea vomiting and diarrhea 02/16/2018  . Diabetes ([place]) 02/16/2018  . Essential hypertension 02/16/2018  . Anemia 02/16/2018    Past Medical History:  Past Medical History:  Diagnosis Date  . Colitis   . Diabetes mellitus without complication ([place])   . Hypertension   . Malnutrition ([place])    Past Surgical History:  Past Surgical History:  Procedure Laterality Date  . BIOPSY  02/26/2018   Procedure: BIOPSY;  Surgeon: Wilford Corner, MD;  Location: [facility];  Service: Endoscopy;;  .  COLONOSCOPY WITH PROPOFOL N/A 02/26/2018   Procedure: COLONOSCOPY WITH PROPOFOL;  Surgeon: Wilford Corner, MD;  Location: [facility];  Service: Endoscopy;  Laterality: N/A;  . ESOPHAGOGASTRODUODENOSCOPY (EGD) WITH PROPOFOL N/A 02/26/2018   Procedure: ESOPHAGOGASTRODUODENOSCOPY (EGD) WITH PROPOFOL;  Surgeon: Wilford Corner, MD;  Location: [facility];  Service: Endoscopy;  Laterality: N/A;  . NO PAST SURGERIES     UNCERTAIN OF NAME OF SURGERY    Assessment & Plan Clinical Impression:  Maria Avila is a 67 year old female with history of T2DM, HTN, history of heavy alcohol use,lymphocytosis/leucopenia, colitis with 6 month year history of N/V/diarrhea/anorexia with 40 lbs weigh loss and abdominal pain who was admitted on 02/16/18 wwith negative extensive work up and was discharged to SNF for rehab. History taken from chart review, patient and family. She was readmitted on 03/04/18 with mental status changes, hypoxia and tachycardia due to sepsis with near complete opacification of left chest due to empyema. She was intubated, left chest tube placed with drainage of purulent material and was started on IV antibiotics. Dr. Roxan Hockey recommended gastrografin swallow which was negative for perforation and he felt that empyema likely due to aspiration PNA. Brain MRI reviewed, showing mulitple CVAs. Per report, bilateral multiple CVA, greatest in left parietal lobe. Chest tube treated with tPA for thrombolysis and to help with reexpansion of lung and discontinued on 11/9 as non functional. . Culture positive for E coli, enterococcus and pseudomonas aeruginosa and antibiotics narrowed to Levaquin with stop date 11/21.  Patient transferred to CIR on  03/17/2018 .   Patient currently requires total +2 with mobility secondary to decreased cardiorespiratoy endurance, impaired timing and sequencing, unbalanced muscle activation, motor apraxia, decreased coordination and decreased motor planning, decreased  initiation, decreased attention, decreased awareness, decreased problem solving, decreased safety awareness, decreased memory and delayed processing and decreased sitting balance, decreased standing balance, decreased postural control, hemiplegia and decreased balance strategies.  Prior to hospitalization, patient was independent  with mobility and lived with Spouse, Family in a House home.  Home access is 3-4(husband plans to install rails if recommended)Stairs to enter.  Patient will benefit from skilled PT intervention to maximize safe functional mobility, minimize fall risk and decrease caregiver burden for planned discharge home with 24 hour supervision.  Anticipate patient will benefit from follow up [place] at discharge.  PT - End of Session Activity Tolerance: Tolerates 10 - 20 min activity with multiple rests Endurance Deficit: Yes Endurance Deficit Description: fatigued after propelling w/c c 25' PT Assessment Rehab Potential (ACUTE/IP ONLY): Good PT Patient demonstrates impairments in the following area(s): Balance;Edema;Endurance;Motor;Pain;Safety;Skin Integrity PT Transfers Functional Problem(s): Bed Mobility;Bed to Chair;Car;Furniture PT Locomotion Functional Problem(s): Ambulation;Wheelchair Mobility;Stairs PT Plan PT Intensity: Minimum of 1-2 x/day ,45 to 90 minutes PT Frequency: 5 out of 7 days PT Duration Estimated Length of Stay: 21-24 days PT Treatment/Interventions: Ambulation/gait training;Community reintegration;DME/adaptive equipment instruction;Neuromuscular re-education;Psychosocial support;Stair training;UE/LE Strength taining/ROM;Wheelchair propulsion/positioning;UE/LE Coordination activities;Therapeutic Activities;Pain management;Functional electrical stimulation;Discharge planning;Balance/vestibular training;Cognitive remediation/compensation;Functional mobility training;Patient/family education;Splinting/orthotics;Therapeutic Exercise;Visual/perceptual  remediation/compensation PT Transfers Anticipated Outcome(s): S basic, min assist car PT Locomotion Anticipated Outcome(s): S transfer and w/c propulsion 150'; gait and stairs TBD PT Recommendation Recommendations for Other Services: Neuropsych consult(cognitive deficits due to CVA) Follow Up Recommendations: 24 hour supervision/assistance Patient destination: Home Equipment Recommended: To be determined  Skilled Therapeutic Intervention  Pt sitting up in w/c; husband present.  Pt stated that it was February , and that she was at [employer] (which did own [employer], per previous employer) throughout session despite frequent re-orientation. She was able to state that she thought she had had a mild stroke.    W/c> mat using slide board, unsafe, +2 due to LLE extension as PT moved her across board.  neuromuscular re-education via forced use, multimodal cues in supine, for : bil bridging, bil lower trunk rotation, alternating heel slides, alternating ankle pumps, bil hip abduction/adduction in hook lying.  Sitting on mat> stand +2 with helpers on each side of her, with her UEs over helpers backs.  Pt stood x 30 seconds and wt shifted L><R with +2, before  becoming dizzy and needing to sit down.  Therapeutic activity to address  A-P midline orientation / reduce sacral sitting: ltrunk flexion with reaching R/L hands to retrieve cups.  Intermittent ability to grasp cups with R hand, about 50%. Pt often lifted feet up as she attempted to lean forward.   Pt and husband informed about POC, ELOS and deferrment of gait and stairs for now.  Miranda, RN informed PT that pt has a large sacral DQ stage III.  PT informed Roselyn Reef, NT that pt will need to get back to bed soon.  Pt will benefit from a pressure relieving cushion and learning/performing pressure relief strategies while sitting up in w/c.   Pt left resting in w/c with seat belt alarm set and Jamie, NT present taking vitals. Husband  present.   PT Evaluation Precautions/Restrictions Precautions Precautions: Fall Precaution Comments: apraxic, somewhat impulsive Restrictions Weight Bearing Restrictions: No General   Vital SignsTherapy Vitals Dizzy upon sitting  up, which resolved quickly.  Dizzy after standing x 30 seconds with assistance; BP not taken in standing. Pulse Rate: 112 BP: 125/87 Patient Position (if appropriate): Sitting Oxygen Therapy SpO2: 100 % O2 Device: Room Air Pain Pain Assessment Pain Scale: 0-10 Pain Score: 0-No pain Home Living/Prior Functioning Home Living Living Arrangements: Spouse/significant other Available Help at Discharge: Family;Available 24 hours/day Type of Home: House Home Access: Stairs to enter [employer] of Steps: 3-4(husband plans to install rails if recommended) Entrance Stairs-Rails: None Home Layout: One level Bathroom Shower/Tub: Walk-in shower;Tub/shower unit(typically used walk-in shower) Bathroom Toilet: Standard  Lives With: Spouse;Family Prior Function Level of Independence: Independent with basic ADLs;Independent with homemaking with ambulation;Independent with gait  Able to Take Stairs?: Yes Driving: Yes Vocation: Unemployed(recently laid-off) Vision/Perception  Vision - Assessment Additional Comments: needs further assessment  Cognition Overall Cognitive Status: Impaired/Different from baseline Arousal/Alertness: Awake/alert Orientation Level: Oriented to person;Disoriented to place;Disoriented to time;Oriented to situation Attention: Sustained Sustained Attention: Impaired Sustained Attention Impairment: Verbal basic;Functional basic Memory: Impaired Memory Impairment: Storage deficit;Retrieval deficit;Decreased recall of new information;Decreased short term memory Awareness: Impaired Awareness Impairment: Intellectual impairment;Emergent impairment;Anticipatory impairment Problem Solving: Impaired Problem Solving Impairment:  Verbal basic;Functional basic Safety/Judgment: Impaired Sensation Sensation Light Touch: Appears Intact Proprioception: Appears Intact Coordination Gross Motor Movements are Fluid and Coordinated: No Fine Motor Movements are Fluid and Coordinated: No Heel Shin Test: unable bil Motor  Motor Motor: Motor apraxia;Hemiplegia;Abnormal postural alignment and control Motor - Skilled Clinical Observations: LLE  Mobility Bed Mobility Bed Mobility: Rolling Right;Rolling Left;Right Sidelying to Sit;Sit to Supine Rolling Right: Minimal Assistance - Patient > 75% Rolling Left: Minimal Assistance - Patient > 75% Right Sidelying to Sit: Moderate Assistance - Patient 50-74% Left Sidelying to Sit: Maximal Assistance - Patient 25-49% Sit to Supine: Moderate Assistance - Patient 50-74% Transfers Transfers: Set designer Transfers Sit to Stand: 2 Helpers Squat Pivot Transfers: 2 Press photographer (Assistive device): None Transfer via Lift Equipment: Animal nutritionist: No Gait Gait: No Stairs / Additional Locomotion Stairs: No Architect: Yes Wheelchair Assistance: Moderate Assistance - Patient 50 - 74% Wheelchair Propulsion: Both upper extremities Wheelchair Parts Management: Needs assistance Distance: 25  Trunk/Postural Assessment  Cervical Assessment Cervical Assessment: Within Functional Limits Thoracic Assessment Thoracic Assessment: Within Functional Limits Lumbar Assessment Lumbar Assessment: Exceptions to WFL(sits in posterior pelvic tilt) Postural Control Postural Control: Deficits on evaluation Righting Reactions: LOB R and backwards without reaction Protective Responses: delayed and inadequate  Balance Balance Balance Assessed: Yes Static Sitting Balance Static Sitting - Level of Assistance: 3: Mod assist;4: Min assist Dynamic Sitting Balance Dynamic Sitting - Level of Assistance: 4: Min assist;3: Mod assist Static Standing  Balance Static Standing - Level of Assistance: 1: +2 Total assist Dynamic Standing Balance Dynamic Standing - Level of Assistance: 1: +2 Total assist(wt shifting L><R with LEs against mat table) Extremity Assessment  RUE Assessment RUE Assessment: Exceptions to [facility] Active Range of Motion (AROM) Comments: shoulder flexion limited to 60* with use of abduction to achieve even 60*, elbow and wrist [facility] General Strength Comments: bicep/tricep 4/5, loose gross grasp LUE Assessment LUE Assessment: Exceptions to [facility] Active Range of Motion (AROM) Comments: shoulder flexion limited to 80*, elbow [facility] General Strength Comments: grossly 4/5, Lt grasp > Rt RLE Assessment RLE Assessment: Exceptions to WFL(minimal edema ankle and foot) RLE Strength RLE Overall Strength Comments: difficult to assess due to motor apraxia; in sitting, 1+/5 hip flexion, 2-/5 hip abduction/adduction ,knee extension and ankle DF LLE Assessment  LLE Assessment: Exceptions to WFL(moderate edema ankle and foot) General Strength Comments: difficult to assess due to motor apraxia; grossly in sitting: 1+/5 hip flexion,, 2-/5 hip abd/adduction, knee extension and ankle DF    Refer to Care Plan for Long Term Goals  Recommendations for other services: Neuropsych  Discharge Criteria: Patient will be discharged from PT if patient refuses treatment 3 consecutive times without medical reason, if treatment goals not met, if there is a change in medical status, if patient makes no progress towards goals or if patient is discharged from hospital.  The above assessment, treatment plan, treatment alternatives and goals were discussed and mutually agreed upon: by patient and by family  Donnika Kucher 03/18/2018, 4:52 PM

## 2018-03-18 NOTE — Progress Notes (Addendum)
Initial Nutrition Assessment  DOCUMENTATION CODES:   Non-severe (moderate) malnutrition in context of chronic illness  INTERVENTION:   - Please obtain new weight as last recorded weight is from 11/6  - Recommend restarting Ensure Enlive po TID, each supplement provides 350 kcal and 20 grams of protein (chocolate or strawberry flavor) - noted this order was d/c today by RN  - Lactaid milk TID with meals, each carton provides 90 kcal and 8 grams of protein  - Continue Prostat BID, each supplement provides 100 kcals and 15 grams protein  - Encourage adequate PO intake  - 48-hour calorie count to end tomorrow at lunch meal  NUTRITION DIAGNOSIS:   Moderate Malnutrition related to chronic illness (chronic colitis) as evidenced by mild muscle depletion, moderate muscle depletion, mild fat depletion, moderate fat depletion.  GOAL:   Patient will meet greater than or equal to 90% of their needs  MONITOR:   PO intake, Supplement acceptance, Diet advancement, I & O's, Weight trends, Labs, Skin  REASON FOR ASSESSMENT:   Malnutrition Screening Tool, Consult Calorie Count  ASSESSMENT:   67 year old female with PMH significant for T2DM, HTN, heavy alcohol use, lymphocytosis/leucopenia, and colitis with 6 month history of N/V/D and anorexia with 40 lb weight loss and abdominal pain. Pt was admitted on 02/16/18 with negative extensive work-up and was discharged to SNF for rehab. Pt was readmitted on 03/04/18 with AMS, hypoxia, and tachycardia due to sepsis with near complete opacification of left chest due to empyema. Pt was intubated and left chest tube placed with drainage of purulent material. Pt started on IV antibiotics. Pt extubated 11/4. Gastrografin swallow negative for perforation, empyema felt likely due to aspiration PNA. Brain MRI reviewed showing mulitple CVAs. Chest tube treated with tPA for thrombolysis and to help with reexpansion of lung and discontinued on 11/9 as  non-functional. Pt received TPN during previous admission (11/2-11/9).  Spoke with pt and her husband at bedside.  Pt states that she is drinking her Ensure "on and off." Pt's husband reports that it gives pt diarrhea if she drinks too many of them. Encouraged pt to try to drink at least 2 supplements daily. Pt prefers lactose-free milk. RD to order TID with meals.  Pt reports that her appetite has "picked up" over the past week and that she is eating more than she used to.  Pt reports her UBW as 160 lbs and that she last weighed this over 1 year ago prior to "getting sick." Suspect pt with significant weight loss but unable to confirm at this time given no weight since admission to CIR.  RD returned in an attempt to obtain bed weight. However, pt in wheelchair eating lunch upon return visit. Pt states that her lunch was good and that she is "about finished."  Meal Completion: 40-80% x last 4 recorded meals  Medications reviewed and include: Ensure Enlive TID - d/c today by RN, Pro-stat BID, lactobacillus, Protonix, Fibercon, K-dur, thiamine  Labs reviewed: potassium 3.3 (L) on 11/12 - on daily supplement CBG's: 78, 81 x 24 hours  UOP: 1000 ml x 24 hours Stool output: 60 ml x 24 hours  NUTRITION - FOCUSED PHYSICAL EXAM:    Most Recent Value  Orbital Region  No depletion  Upper Arm Region  Moderate depletion  Thoracic and Lumbar Region  Mild depletion  Buccal Region  Mild depletion  Temple Region  Moderate depletion  Clavicle Bone Region  Moderate depletion  Clavicle and Acromion Bone Region  Moderate depletion  Scapular Bone Region  Unable to assess  Dorsal Hand  Mild depletion  Patellar Region  Moderate depletion  Anterior Thigh Region  Moderate depletion  Posterior Calf Region  Moderate depletion  Edema (RD Assessment)  Mild  Hair  Reviewed  Eyes  Reviewed  Mouth  Reviewed  Skin  Reviewed  Nails  Reviewed       Diet Order:   Diet Order            DIET DYS 3 Room  service appropriate? Yes; Fluid consistency: Thin  Diet effective now              EDUCATION NEEDS:   Education needs have been addressed  Skin:  Skin Assessment: Skin Integrity Issues: Stage III: ischial tuberosity Incisions: left upper flank from previous chest tube Other: wound to perineum  Last BM:  11/14 (60 ml x 24 hours via rectal tube)  Height:   Ht Readings from Last 1 Encounters:  03/10/18 5\' 2"  (1.575 m)    Weight:   Wt Readings from Last 1 Encounters:  02/16/18 48.7 kg    Ideal Body Weight:  50 kg  BMI:  Unable to calculate at this time as last weight in flowcharts is from 11/6.  Estimated Nutritional Needs:   Kcal:  1800-2000  Protein:  100-115 grams  Fluid:  >/= 1.8 L    Gaynell Face, MS, RD, LDN Inpatient Clinical Dietitian Pager: [phone] Weekend/After Hours: [phone]

## 2018-03-18 NOTE — Progress Notes (Signed)
Social Work  Social Work Assessment and Plan  Patient Details  Name: Maria Avila MRN: [id number] Date of Birth: [birth year]  Today's Date: 03/18/2018  Problem List:  Patient Active Problem List   Diagnosis Date Noted  . Stroke due to embolism ([place]) 03/17/2018  . Hypokalemia   . ETOH abuse   . Dysphagia, post-stroke   . Benign essential HTN   . Acute deep vein thrombosis (DVT) of distal vein of both lower extremities (HCC)   . Sinus tachycardia   . Chest tube in place   . Esophageal perforation   . Perforation esophagus   . Sepsis with acute renal failure ([place])   . Acute deep vein thrombosis (DVT) of both peroneal veins   . Leukocytosis   . Sepsis due to Pseudomonas species ([place])   . Cerebral embolism with cerebral infarction 03/09/2018  . Malnutrition of moderate degree 03/08/2018  . Empyema ([place]) 03/04/2018  . Pressure injury of skin 03/04/2018  . Acute respiratory failure with hypercapnia ([place]) 03/04/2018  . Diarrhea 02/26/2018  . Pancytopenia ([place]) 02/19/2018  . Hypotension 02/19/2018  . Severe protein-calorie malnutrition ([place]) 02/17/2018  . Colitis 02/16/2018  . Weakness 02/16/2018  . Nausea vomiting and diarrhea 02/16/2018  . Diabetes ([place]) 02/16/2018  . Essential hypertension 02/16/2018  . Anemia 02/16/2018   Past Medical History:  Past Medical History:  Diagnosis Date  . Colitis   . Diabetes mellitus without complication ([place])   . Hypertension   . Malnutrition ([place])    Past Surgical History:  Past Surgical History:  Procedure Laterality Date  . BIOPSY  02/26/2018   Procedure: BIOPSY;  Surgeon: Wilford Corner, MD;  Location: [facility];  Service: Endoscopy;;  . COLONOSCOPY WITH PROPOFOL N/A 02/26/2018   Procedure: COLONOSCOPY WITH PROPOFOL;  Surgeon: Wilford Corner, MD;  Location: [facility];  Service: Endoscopy;  Laterality: N/A;  . ESOPHAGOGASTRODUODENOSCOPY (EGD) WITH PROPOFOL N/A 02/26/2018   Procedure: ESOPHAGOGASTRODUODENOSCOPY (EGD)  WITH PROPOFOL;  Surgeon: Wilford Corner, MD;  Location: [facility];  Service: Endoscopy;  Laterality: N/A;  . NO PAST SURGERIES     UNCERTAIN OF NAME OF SURGERY   Social History:  reports that she has quit smoking. She has never used smokeless tobacco. She reports that she drank alcohol. She reports that she has current or past drug history.  Family / Support Systems Marital Status: Married Patient Roles: Spouse, Parent Spouse/Significant Other: [name] [phone] Children: Levada Dy -daughter [phone]  Tilman Neat [phone] Other Supports: Two other children and grandchildren Anticipated Caregiver: Husband is the main caregiver and son comes over daily Ability/Limitations of Caregiver: Husband has no limitations Caregiver Availability: 24/7 Family Dynamics: Close knit family who rely upon one another and glad to finally have a diagnosis for their Mom. Pt has friends and church members who are involved and supportive.  Social History Preferred language: English Religion: Baptist Cultural Background: No issues Education: [employer] Read: Yes Write: Yes Employment Status: Retired Freight forwarder Issues: No issues Guardian/Conservator: None-according to MD pt is not fully capable of making her own decisions at this time, will look toward her husband if any decision need to be made while here.   Abuse/Neglect Abuse/Neglect Assessment Can Be Completed: Yes Physical Abuse: Denies Verbal Abuse: Denies Sexual Abuse: Denies Exploitation of patient/patient's resources: Denies Self-Neglect: Denies  Emotional Status Pt's affect, behavior adn adjustment status: Pt is motivated to do well and glad to be here. She is tired form today but a good tired since she feels her first day of  therapy went well. Husband has been here all day and observed wife in therpaies. She hopes each day will get better than the last. Husband is a strong support of pt. Recent Psychosocial  Issues: other health issues has been declining for the past 6 months Pyschiatric History: History of depression is not taking any medications for this, but may benefit if she feels herself getting worse. She wants to see how she does and hope for the best on CIR. Will monitor and see if need to make referral to neuro-psych to see Substance Abuse History: History of ETOH both feel it was more social drinking and not to excess. She does not plan to drink anymore now she has been so sick  Patient / Family Perceptions, Expectations & Goals Pt/Family understanding of illness & functional limitations: Pt and husband have a good understanding up to this point of her treatment and plans going forward. She feels fortuante she has full movement in all of her limbs and needs to get stronger here to go home. Husband plans to be here daily and make sure pt has what she needs. Premorbid pt/family roles/activities: wife, mom, grandmother, retiree, church member, friend, etc Anticipated changes in roles/activities/participation: resume Pt/family expectations/goals: Pt states: " I want to be able to do for myself and feel better than I have been feeling."  Husband states; " I will help her I have been with her 81 years."  [employer]: Other (Comment)(was at [facility] for 5 days before being re-admitted) Premorbid Home Care/DME Agencies: Other (Comment)(has some equipment at home) Transportation available at discharge: Husband and children Resource referrals recommended: Neuropsychology, Support group (specify)  Discharge Planning Living Arrangements: Spouse/significant other Support Systems: Spouse/significant other, Children, Other relatives, Friends/neighbors, Church/faith community Type of Residence: [place] Name: [place] Resources: Multimedia programmer (specify)(UHC-Medicare) Financial Resources: Social  Security, Family Support Financial Screen Referred: No Living Expenses: Lives with family Money Management: Spouse Does the patient have any problems obtaining your medications?: No Home Management: Pt and husband before pt got sick Patient/Family Preliminary Plans: To return home with husband who can assist her. They have supportive children who will assist their father at discharge. Will await team's evaluations and work on needs. Husband plans to be here daily and watch wife make progress. Social Work Anticipated Follow Up Needs: HH/OP, Support Group  Clinical Impression Pleasant female who is willing to work in therapies to recover form her illness and get back home. Both she and husband glad a diagnosis is finally been found and hope each day will be better than the last. Husband is very dedicated to pt and will be here daily to support her. Will await evaluations and see if pt would benefit from seeing neuro-psych while here.  Elease Hashimoto 03/18/2018, 3:02 PM

## 2018-03-18 NOTE — Progress Notes (Signed)
Inpatient Rehabilitation  Patient information reviewed and entered into eRehab system by Julio Zappia M. Bayron Dalto, M.A., CCC/SLP, PPS Coordinator.  Information including medical coding, functional ability and quality indicators will be reviewed and updated through discharge.    Per nursing patient was given "Data Collection Information Summary" for Patients in Inpatient Rehabilitation Facilities with attached "Privacy Act Statement-Health Care Records" upon admission.   

## 2018-03-18 NOTE — Evaluation (Signed)
Speech Language Pathology Assessment and Plan  Patient Details  Name: Maria Avila MRN: [id number] Date of Birth: December 27, 1950  SLP Diagnosis: Cognitive Impairments;Dysphagia  Rehab Potential: Fair ELOS: 2.5 to 3 weeks    Today's Date: 03/18/2018 SLP Individual Time: 2353-6144 SLP Individual Time Calculation (min): 75 min   Problem List:  Patient Active Problem List   Diagnosis Date Noted  . Stroke due to embolism ([place]) 03/17/2018  . Hypokalemia   . ETOH abuse   . Dysphagia, post-stroke   . Benign essential HTN   . Acute deep vein thrombosis (DVT) of distal vein of both lower extremities (HCC)   . Sinus tachycardia   . Chest tube in place   . Esophageal perforation   . Perforation esophagus   . Sepsis with acute renal failure ([place])   . Acute deep vein thrombosis (DVT) of both peroneal veins   . Leukocytosis   . Sepsis due to Pseudomonas species ([place])   . Cerebral embolism with cerebral infarction 03/09/2018  . Malnutrition of moderate degree 03/08/2018  . Empyema ([place]) 03/04/2018  . Pressure injury of skin 03/04/2018  . Acute respiratory failure with hypercapnia ([place]) 03/04/2018  . Diarrhea 02/26/2018  . Pancytopenia ([place]) 02/19/2018  . Hypotension 02/19/2018  . Severe protein-calorie malnutrition ([place]) 02/17/2018  . Colitis 02/16/2018  . Weakness 02/16/2018  . Nausea vomiting and diarrhea 02/16/2018  . Diabetes ([place]) 02/16/2018  . Essential hypertension 02/16/2018  . Anemia 02/16/2018   Past Medical History:  Past Medical History:  Diagnosis Date  . Colitis   . Diabetes mellitus without complication ([place])   . Hypertension   . Malnutrition ([place])    Past Surgical History:  Past Surgical History:  Procedure Laterality Date  . BIOPSY  02/26/2018   Procedure: BIOPSY;  Surgeon: Wilford Corner, MD;  Location: [facility];  Service: Endoscopy;;  . COLONOSCOPY WITH PROPOFOL N/A 02/26/2018   Procedure: COLONOSCOPY WITH PROPOFOL;  Surgeon: Wilford Corner, MD;   Location: [facility];  Service: Endoscopy;  Laterality: N/A;  . ESOPHAGOGASTRODUODENOSCOPY (EGD) WITH PROPOFOL N/A 02/26/2018   Procedure: ESOPHAGOGASTRODUODENOSCOPY (EGD) WITH PROPOFOL;  Surgeon: Wilford Corner, MD;  Location: [facility];  Service: Endoscopy;  Laterality: N/A;  . NO PAST SURGERIES     UNCERTAIN OF NAME OF SURGERY    Assessment / Plan / Recommendation Clinical Impression Maria Avila is a 67 year old female with history of T2DM, HTN, history of heavy alcohol use, lymphocytosis/leucopenia, colitis with 6 month year history of N/V/diarrhea/anorexia with 40 lbs weigh loss and abdominal pain who was admitted on 02/16/18 wwith negative extensive work up and was discharged to SNF for rehab. History taken from chart review, patient and family. She was readmitted on 03/04/18 with mental status changes, hypoxia and tachycardia due to sepsis with near complete opacification of left chest due to empyema. She was intubated, left chest tube placed with drainage of purulent material and was started on IV antibiotics. Dr. Roxan Hockey recommended gastrografin swallow which was negative for perforation and he felt that empyema likely due to aspiration PNA. Brain MRI reviewed, showing mulitple CVAs. Per report, bilateral multiple CVA, greatest in left parietal lobe. Chest tube treated with tPA for thrombolysis and to help with reexpansion of lung and discontinued on 11/9 as non functional. Culture positive for E coli, enterococcus and pseudomonas aeruginosa and antibiotics narrowed to Levaquin with stop date 11/21.   Pt admited to CIR on 03/17/18. Bedside Swallow evaluation and Cognitive Linguistic evaluation completed on 03/18/18. Pt presents with mild  oral phase dysphagia and is currently consuming dysphagia 3 diet as result. Pt consumed 4 oz thin liquids via cup with no overt s/s of aspiration. Pt's dysphagia is likely compounded by dentures and overall fatigue/deconditioning and cognitive  attention. As such pt presents with moderate cognitive deficits that are further impacted by fatigue c/b decreased sustained attention, recall of orientation information, basic problem solving and intellectual awareness. Her cognitive deficits are also further compounded by several hospitalizations, history of heavy alcohol use and lengthy illness as well as 10th grade education. Therefore prognosis is guarded but skilled ST is required to return pt to prior level of independence and reduce caregiver burden. Anticipate that pt will require 24 hour supervision and HHST at discharge.    Skilled Therapeutic Interventions          Skilled treatment session focused on completion of bedside swallow evaluation and cognitive linguistic evaluations, see above. Pt consumed thin liquids without overt s/s of aspiration. Solids not attempted d/t fatigue and decreased sustained attention.    SLP Assessment  Patient will need skilled Speech Lanaguage Pathology Services during CIR admission    Recommendations  SLP Diet Recommendations: Dysphagia 3 (Mech soft);Thin Liquid Administration via: Cup;Straw Medication Administration: Crushed with puree Supervision: Patient able to self feed;Intermittent supervision to cue for compensatory strategies Compensations: Minimize environmental distractions;Slow rate;Small sips/bites Postural Changes and/or Swallow Maneuvers: Seated upright 90 degrees Oral Care Recommendations: Oral care BID Patient destination: Home Follow up Recommendations: Home Health SLP;24 hour supervision/assistance Equipment Recommended: None recommended by SLP    SLP Frequency 3 to 5 out of 7 days   SLP Duration  SLP Intensity  SLP Treatment/Interventions 2.5 to 3 weeks  Minumum of 1-2 x/day, 30 to 90 minutes  Cognitive remediation/compensation;Cueing hierarchy;Dysphagia/aspiration precaution training;Functional tasks;Patient/family education;Therapeutic Activities    Pain Pain  Assessment Pain Scale: 0-10 Pain Score: 0-No pain  Prior Functioning Cognitive/Linguistic Baseline: Within functional limits Type of Home: House  Lives With: Spouse;Family Available Help at Discharge: Family;Available 24 hours/day Education: finished 10th grade Vocation: Unemployed(laid-off)  Short Term Goals: Week 1: SLP Short Term Goal 1 (Week 1): Pt will utilize compensatory memory aids to recall orientation information with Min A cues.  SLP Short Term Goal 2 (Week 1): Pt will sustain attention to basic task for ~ 5 minutes with Mod A cues.  SLP Short Term Goal 3 (Week 1): Pt will complete basic problem solving tasks with Min A cues.  SLP Short Term Goal 4 (Week 1): Pt will demonstrate intellectual awareness by listing 2 physical and 2 cognitive deficits related to acute illness with Mod A cues.  SLP Short Term Goal 5 (Week 1): Pt will consume current diet without overt s/s of dysphagia/aspiration with supervision cues for use of compensatory swallow strategies.  SLP Short Term Goal 6 (Week 1): Pt will consume trials of regular diet textures with minimal s/s of dysphagia/aspiration with Min A cues for use of compensatory swallow strategies.   Refer to Care Plan for Long Term Goals  Recommendations for other services: Neuropsych  Discharge Criteria: Patient will be discharged from SLP if patient refuses treatment 3 consecutive times without medical reason, if treatment goals not met, if there is a change in medical status, if patient makes no progress towards goals or if patient is discharged from hospital.  The above assessment, treatment plan, treatment alternatives and goals were discussed and mutually agreed upon: by patient  Isacc Turney 03/18/2018, 11:59 AM

## 2018-03-19 ENCOUNTER — Inpatient Hospital Stay (HOSPITAL_COMMUNITY): Payer: Self-pay | Admitting: Physical Therapy

## 2018-03-19 ENCOUNTER — Inpatient Hospital Stay (HOSPITAL_COMMUNITY): Payer: Self-pay | Admitting: Occupational Therapy

## 2018-03-19 ENCOUNTER — Inpatient Hospital Stay (HOSPITAL_COMMUNITY): Payer: Self-pay | Admitting: Speech Pathology

## 2018-03-19 LAB — GLUCOSE, CAPILLARY
Glucose-Capillary: 71 mg/dL (ref 70–99)
Glucose-Capillary: 74 mg/dL (ref 70–99)
Glucose-Capillary: 94 mg/dL (ref 70–99)

## 2018-03-19 MED ORDER — ADULT MULTIVITAMIN W/MINERALS CH
1.0000 | ORAL_TABLET | Freq: Every day | ORAL | Status: DC
  Administered 2018-03-20 – 2018-04-09 (×21): 1 via ORAL
  Filled 2018-03-19 (×22): qty 1

## 2018-03-19 NOTE — Progress Notes (Signed)
Occupational Therapy Session Note  Patient Details  Name: Maria Avila MRN: [id number] Date of Birth: 1951/01/22  Today's Date: 03/19/2018 OT Individual Time: 1300-1400 OT Individual Time Calculation (min): 60 min    Short Term Goals: Week 1:  OT Short Term Goal 1 (Week 1): Pt will complete bed mobility with max assist of one caregiver to decrease burden of care with bathing and dressing OT Short Term Goal 2 (Week 1): Pt will complete bathing with max assist  OT Short Term Goal 3 (Week 1): Pt will complete bed > w/c transfer to prepare for toilet transfers with max assist of one caregiver OT Short Term Goal 4 (Week 1): Pt will don pants with max assist  Skilled Therapeutic Interventions/Progress Updates:    Treatment session with focus on functional transfers, sit > stand, and dynamic sitting balance.  Pt received upright in bed reporting need to toilet.  Completed bed mobility with mod assist and sit > stand in [place] with +2 assistance for anterior weight shift.  Transferred to toilet with Stedy and +2 for sit > stand from low toilet seat with multimodal cues for anterior weight shift.  Pt with loose stools on toilet.  Donned incontinence brief in standing in [place] with tactile cues for upright standing and +2 for safety.  Pt required +2 when donning pants due to inability to reach feet and inability to obtain figure 4 sitting.  Mod assist UB dressing requiring assistance to pull shirt over head due to limited shoulder ROM.  Engaged in slide board transfer w/c > therapy mat with max assist and +2 for safety, pt with extensor tone in BLE requiring increased assistance for anterior weight shift and blocking of BLE.  Engaged in reaching task while seated on edge of mat to facilitate weight shifting and increased UE ROM.  Min guard for sitting balance.  Pt passed off to PT.  Therapy Documentation Precautions:  Precautions Precautions: Fall Precaution Comments: apraxic, somewhat  impulsive Restrictions Weight Bearing Restrictions: No General:   Vital Signs: Therapy Vitals Temp: 97.9 F (36.6 C) Temp Source: Oral Pulse Rate: (!) 108 Resp: 20 BP: 100/86 Patient Position (if appropriate): Lying Oxygen Therapy SpO2: 100 % O2 Device: Room Air Pain: Pain Assessment Pain Scale: 0-10 Pain Score: 0-No pain   Therapy/Group: Individual Therapy  Simonne Come 03/19/2018, 3:50 PM

## 2018-03-19 NOTE — Progress Notes (Signed)
Speech Language Pathology Daily Session Note  Patient Details  Name: Maria Avila MRN: [id number] Date of Birth: [birth year]  Today's Date: 03/19/2018 SLP Individual Time: 0093-8182 SLP Individual Time Calculation (min): 45 min  Short Term Goals: Week 1: SLP Short Term Goal 1 (Week 1): Pt will utilize compensatory memory aids to recall orientation information with Min A cues.  SLP Short Term Goal 2 (Week 1): Pt will sustain attention to basic task for ~ 5 minutes with Mod A cues.  SLP Short Term Goal 3 (Week 1): Pt will complete basic problem solving tasks with Min A cues.  SLP Short Term Goal 4 (Week 1): Pt will demonstrate intellectual awareness by listing 2 physical and 2 cognitive deficits related to acute illness with Mod A cues.  SLP Short Term Goal 5 (Week 1): Pt will consume current diet without overt s/s of dysphagia/aspiration with supervision cues for use of compensatory swallow strategies.  SLP Short Term Goal 6 (Week 1): Pt will consume trials of regular diet textures with minimal s/s of dysphagia/aspiration with Min A cues for use of compensatory swallow strategies.   Skilled Therapeutic Interventions:  Skilled treatment session focused on dysphagia and cognition goals. SLP facilitated session by providing skilled observation of pt consuming regular snack of graham crackers. Pt with slightly impulsive bolus size but good lingual manipulation and mastication of bolus with complete oral clearing. Pt would benefit from skilled observation of complete regular tray before upgrade is indicated. SLP further facilitated session by providing Max A multimodal cues to recall orientation. SLP provided calendar with date circled. Pt able to locate current date but needed Max A repetition to say date correctly. Pt left upright in bed with all needs within reach. Continue per current plan of care.       Pain Pain Assessment Pain Scale: 0-10 Pain Score: 0-No pain  Therapy/Group: Individual  Therapy  Yan Pankratz 03/19/2018, 11:39 AM

## 2018-03-19 NOTE — Progress Notes (Signed)
Physical Therapy Session Note  Patient Details  Name: Maria Avila MRN: [id number] Date of Birth: 17-Feb-1951  Today's Date: 03/19/2018 PT Individual Time: 1400-1525 PT Individual Time Calculation (min): 85 min   Short Term Goals: Week 1:  PT Short Term Goal 1 (Week 1): pt will move supine>< sit with min assist PT Short Term Goal 2 (Week 1): pt will transfer bed>< w/c with assistance of 1 person PT Short Term Goal 3 (Week 1): pt will propel w/c x 50' with min assist PT Short Term Goal 4 (Week 1): pt will initiate gait with assistance of 2 persons  Skilled Therapeutic Interventions/Progress Updates:    Pt received seated EOM in therapy gym from OT, agreeable to PT session. No complaints of pain. Sit to stand with assist x 2 from elevated mat to stedy. Sit to stand from stedy seat with max A. Pt unable to follow cues for body positioning prior to initiating transfer. Pt exhibits flexed hips in standing and needs max multimodal cueing for upright standing posture. Sheet around hips to pull pt into hip extension in standing. Pt tolerates standing about 2 min each bout with max A for upright posture. Pt exhibits posterior lean of upper trunk in standing and is able to correct with v/c but unable to maintain position. Sit to supine mod A for BLE management. Supine modified bridges 2 x 10 reps for glute activation. Supine B heel slides and hip ER/IR with AAROM. Pt begins falling asleep while supine on therapy mat due to fatigue. Supine to sit with mod A. Sliding board transfer mat table to w/c with assist x 2, no instances of BLE tone kicking in during transfer. Stedy transfer w/c to bed due to bed height. Pt left supine in bed with needs in reach, husband present.  Therapy Documentation Precautions:  Precautions Precautions: Fall Precaution Comments: apraxic, somewhat impulsive Restrictions Weight Bearing Restrictions: No Pain: Pain Assessment Pain Scale: 0-10 Pain Score: 0-No  pain    Therapy/Group: Individual Therapy  Excell Seltzer, PT, DPT  03/19/2018, 3:52 PM

## 2018-03-19 NOTE — Progress Notes (Signed)
Calorie Count Note  48 hour calorie count ordered - 11/13 dinner through 11/15 lunch.  Diet: Dysphagia 3 with thin liquids Supplements:  - Ensure Enlive PO TID, each supplement provides 350 kcal and 20 grams of protein (d/c on 11/4 by RN - recommend continuing this) - ProStat BID, each supplement provides 100 kcals and 15 grams protein   03/17/18: Dinner: 193 kcal, 11 grams of protein Supplements: 1 Ensure Enlive (350 kcal, 20 grams of protein), 1 Pro-stat (100 kcal, 15 grams of protein)  03/18/18: Breakfast: 460 kcal, 18 grams of protein Lunch: 346 kcal, 12 grams of protein Supplements: 1 Ensure Enlive per pt report (350 kcal, 20 grams of protein), 1 Pro-stat (100 kcal, 15 grams of protein) Dinner: 498 kcal, 22 grams of protein  03/19/18: Breakfast: no documented intake Lunch: 431 kcal, 25 grams of protein  Day 1: 1449 kcal, 76 g protein Day 2: 1379 kcal, 82 g protein  Average 24-hour intake: 1414 kcal (79% of minimum estimated needs)  79 protein (79% of minimum estimated needs)  Nutrition Dx:  Moderate Malnutritionrelated to chronic illness (chronic colitis)as evidenced by mild muscle depletion, moderate muscle depletion, mild fat depletion, moderate fat depletion.  Goal: Patient will meet greater than or equal to 90% of their needs  Intervention: - Prostat BID, each supplement provides 100 kcals and 15 grams protein.  - Recommend restarting Ensure Enlive either BID or TID (d/c on 11/14 by RN) - Encourage adequate PO intake - Ordered new pt weight (last wt on 11/6 prior to admission to rehab)  Althea Grimmer, MS, RDN, LDN On-call pager: [phone]

## 2018-03-19 NOTE — Progress Notes (Signed)
Pt chest tube site on left flank is actively bleeding. RN performed dressing change on 11/15 at 0637. Pt displayed new onset of +2 pitting edema to the ankles and dorsal surface of both feet upon reassessment by RN om 11/15 at [phone]. Pt appears to be in no immediate distress at this time. RN will continue to monitor.

## 2018-03-19 NOTE — Progress Notes (Addendum)
[place] PHYSICAL MEDICINE & REHABILITATION PROGRESS NOTE   Subjective/Complaints: No new complaints. Good appetite, eating breakfast. Some bleeding from CT site.  ROS: Patient denies fever, rash, sore throat, blurred vision, nausea, vomiting, diarrhea, cough, shortness of breath or chest pain, joint or back pain, headache, or mood change.   Objective:   No results found. Recent Labs    03/18/18 1210  WBC 11.1*  HGB 9.6*  HCT 30.1*  PLT 351   Recent Labs    03/18/18 1210  NA 139  K 3.2*  CL 112*  CO2 24  GLUCOSE 89  BUN 7*  CREATININE 0.49  CALCIUM 8.3*    Intake/Output Summary (Last 24 hours) at 03/19/2018 0943 Last data filed at 03/18/2018 1800 Gross per 24 hour  Intake 260 ml  Output 600 ml  Net -340 ml     Physical Exam: Vital Signs Blood pressure 138/90, pulse (!) 101, temperature 98.2 F (36.8 C), temperature source Oral, resp. rate 17, SpO2 100 %. Constitutional: No distress . Vital signs reviewed. HEENT: EOMI, oral membranes moist Neck: supple Cardiovascular: RRR without murmur. No JVD    Respiratory: CTA Bilaterally without wheezes or rales. Normal effort    GI: BS +, non-tender, non-distended  Musculoskeletal: She exhibits edema (1+ edema BLE to ankles. ).  Neurological: She is alert. Oriented toself-hospital.  Leans to left. Delayed processing. LUE and LLE 4+/5. RUE and RLE 4- to 4/5. Mild right inattention.      Skin: Sacral wound dressed. Left CT site with sl s/s drainage, dressed Psychiatric: mood up beat     Assessment/Plan: 1. 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  Physiatrist is providing close team supervision and 24 hour management of active medical problems listed below.  Physiatrist and rehab team continue to assess barriers to discharge/monitor patient progress toward functional and medical goals  Care  Tool:  Bathing              Bathing assist       Upper Body Dressing/Undressing Upper body dressing   What is the patient wearing?: Hospital gown only    Upper body assist Assist Level: Maximal Assistance - Patient 25 - 49%    Lower Body Dressing/Undressing Lower body dressing      What is the patient wearing?: Pants, Incontinence brief     Lower body assist Assist for lower body dressing: 2 Helpers     Toileting Toileting    Toileting assist Assist for toileting: Total Assistance - Patient < 25%     Transfers Chair/bed transfer  Transfers assist     Chair/bed transfer assist level: 2 Helpers     Locomotion Ambulation   Ambulation assist   Ambulation activity did not occur: Safety/medical concerns(apraxia)  Assist level: Maximal Assistance - Patient 25 - 49% Assistive device: Sara Plus     Walk 10 feet activity   Assist           Walk 50 feet activity   Assist           Walk 150 feet activity   Assist           Walk 10 feet on uneven surface  activity   Assist           Wheelchair     Assist Will patient use wheelchair at discharge?: Yes Type of Wheelchair: Manual    Wheelchair assist level: Moderate  Assistance - Patient 50 - 74% Max wheelchair distance: 25    Wheelchair 50 feet with 2 turns activity    Assist    Wheelchair 50 feet with 2 turns activity did not occur: Safety/medical concerns(fatigue)   Assist Level: Minimal Assistance - Patient > 75%   Wheelchair 150 feet activity     Assist Wheelchair 150 feet activity did not occur: Safety/medical concerns(fatigue)         Medical Problem List and Plan: 1.  Limitations in mobility, self-care, endurance secondary to L>R infarcts and multiple medical issues.  -continue theraies 2.  BLE DVTs/Anticoagulation: Pharmaceutical: Other (comment)-on Eliquis 3. Pain Management: tylenol prn.  4. Mood: LCSW to follow for evaluation and support.   5. Neuropsych: This patient is not capable of making decisions on her own behalf. 6. Skin/Wound Care: Air mattress due to sacral breakdown.   -nutrition/local skin care to chest/sacrum 7. Fluids/Electrolytes/Nutrition: Off TNA. Continue dysphagia 3 diet.  8. Empyema: Continue Levaquin for 3 weeks--end date 11/21.   -local care to CT site 9. Malnutrition/Heavy alcohol use: Poor intake with 40 lbs weight loss.   -Prealbumin <5.    -B12 and folate levels normal, thiamine level pending 10. Diarrhea: Added probiotic and fiber. Discontinued Ensure and added prostat.    -rectal tube out  -stool still loose but becoming more formed. Likely abx effect  -no indications that it's infectious although WBC sl elevated (11.1 on 11/15)  -added imodium  -recheck CBC Monday 11. Hypokalemia: Likely due to nutritional deficits as well as diarrhea.   -replace potassium 12. Leucocytosis with neutropenia: Continue to monitor.     LOS: 2 days A FACE TO FACE EVALUATION WAS PERFORMED  Meredith Staggers 03/19/2018, 9:43 AM

## 2018-03-20 ENCOUNTER — Inpatient Hospital Stay (HOSPITAL_COMMUNITY): Payer: Self-pay | Admitting: Speech Pathology

## 2018-03-20 ENCOUNTER — Inpatient Hospital Stay (HOSPITAL_COMMUNITY): Payer: Medicare Other

## 2018-03-20 ENCOUNTER — Inpatient Hospital Stay (HOSPITAL_COMMUNITY): Payer: Self-pay | Admitting: Physical Therapy

## 2018-03-20 ENCOUNTER — Inpatient Hospital Stay (HOSPITAL_COMMUNITY): Payer: Self-pay

## 2018-03-20 DIAGNOSIS — D62 Acute posthemorrhagic anemia: Secondary | ICD-10-CM

## 2018-03-20 DIAGNOSIS — R4587 Impulsiveness: Secondary | ICD-10-CM

## 2018-03-20 DIAGNOSIS — D72829 Elevated white blood cell count, unspecified: Secondary | ICD-10-CM

## 2018-03-20 LAB — BASIC METABOLIC PANEL
Anion gap: 4 — ABNORMAL LOW (ref 5–15)
BUN: 10 mg/dL (ref 8–23)
CO2: 23 mmol/L (ref 22–32)
Calcium: 8.2 mg/dL — ABNORMAL LOW (ref 8.9–10.3)
Chloride: 112 mmol/L — ABNORMAL HIGH (ref 98–111)
Creatinine, Ser: 0.71 mg/dL (ref 0.44–1.00)
GFR calc Af Amer: >60 mL/min (ref >60)
Glucose, Bld: 88 mg/dL (ref 70–99)
POTASSIUM: 3.5 mmol/L (ref 3.5–5.1)
SODIUM: 139 mmol/L (ref 135–145)

## 2018-03-20 LAB — CBC
HCT: 27.5 % — ABNORMAL LOW (ref 36.0–46.0)
Hemoglobin: 9 g/dL — ABNORMAL LOW (ref 12.0–15.0)
MCH: 31.4 pg (ref 26.0–34.0)
MCHC: 32.7 g/dL (ref 30.0–36.0)
MCV: 95.8 fL (ref 80.0–100.0)
NRBC: 0 % (ref 0.0–0.2)
PLATELETS: 308 10*3/uL (ref 150–400)
RBC: 2.87 MIL/uL — AB (ref 3.87–5.11)
RDW: 18.1 % — AB (ref 11.5–15.5)
WBC: 8.6 10*3/uL (ref 4.0–10.5)

## 2018-03-20 LAB — GLUCOSE, CAPILLARY
Glucose-Capillary: 73 mg/dL (ref 70–99)
Glucose-Capillary: 78 mg/dL (ref 70–99)
Glucose-Capillary: 79 mg/dL (ref 70–99)
Glucose-Capillary: 99 mg/dL (ref 70–99)

## 2018-03-20 NOTE — Progress Notes (Signed)
At 0130, Patient observed on floor, on left side of bed. Fara Olden, NT sitting close to patient's room, when he heard patient. All SR's were up when staff left room at 0100, bottom left SR was down when NT entered room. Patient A & O to self, agitated, wanting to go home. Multi attempts to reorient, unsuccessful. No obvious injuries, but patient agitated,not allowing to staff to assess her thoroughly. Telesitter ordered, will relocate closer to nurses station.Patrici Ranks A

## 2018-03-20 NOTE — Progress Notes (Signed)
Speech Language Pathology Daily Session Note  Patient Details  Name: Maria Avila MRN: [id number] Date of Birth: [birth year]  Today's Date: 03/20/2018 SLP Individual Time: 1420-1500 SLP Individual Time Calculation (min): 40 min and Today's Date: 03/20/2018 SLP Missed Time: 20 Minutes Missed Time Reason: Nursing care  Short Term Goals: Week 1: SLP Short Term Goal 1 (Week 1): Pt will utilize compensatory memory aids to recall orientation information with Min A cues.  SLP Short Term Goal 2 (Week 1): Pt will sustain attention to basic task for ~ 5 minutes with Mod A cues.  SLP Short Term Goal 3 (Week 1): Pt will complete basic problem solving tasks with Min A cues.  SLP Short Term Goal 4 (Week 1): Pt will demonstrate intellectual awareness by listing 2 physical and 2 cognitive deficits related to acute illness with Mod A cues.  SLP Short Term Goal 5 (Week 1): Pt will consume current diet without overt s/s of dysphagia/aspiration with supervision cues for use of compensatory swallow strategies.  SLP Short Term Goal 6 (Week 1): Pt will consume trials of regular diet textures with minimal s/s of dysphagia/aspiration with Min A cues for use of compensatory swallow strategies.   Skilled Therapeutic Interventions: Skilled treatment session focused on cognitive goals. The initial 20 mins of the session were missed due to RN care (cathing) and patient participated in treatment while upright in bed. Patient sorted coins with extra time and Min A verbal cues and identified coins and generated specific amounts demonstrated sustained attention to task for ~30 minutes with Min A verbal cues for redirection. Patient left supine in bed with family present and all needs within reach. Continue with current plan of care.       Pain Pain Assessment Pain Scale: 0-10 Pain Score: 0-No pain  Therapy/Group: Individual Therapy  Sanaiyah Kirchhoff, [place] 03/20/2018, 3:14 PM

## 2018-03-20 NOTE — Progress Notes (Signed)
Physical Therapy Session Note  Patient Details  Name: Maria Avila MRN: [id number] Date of Birth: 13-Sep-1950  Today's Date: 03/20/2018 PT Individual Time: 0800-0858 PT Individual Time Calculation (min): 58 min   Short Term Goals: Week 1:  PT Short Term Goal 1 (Week 1): pt will move supine>< sit with min assist PT Short Term Goal 2 (Week 1): pt will transfer bed>< w/c with assistance of 1 person PT Short Term Goal 3 (Week 1): pt will propel w/c x 50' with min assist PT Short Term Goal 4 (Week 1): pt will initiate gait with assistance of 2 persons  Skilled Therapeutic Interventions/Progress Updates:    Pt received supine in bed finishing breakfast with husband present. Agreeable to PT. No complaints of pain. Rolling L/R with mulitmodal cueing for positioning, max A for rolling to dependently don pants. Supine to sit with max A for BLE management and trunk control. Stedy transfer bed to/from w/c. Pt is min A to stand from elevated bed to stedy however pt is max A x 2 for sit to stand from w/c height to stedy. Pt exhibits poor ability to motor plan to perform sit to stand transfer with decreased assist. SB transfer w/c to mat table with total A x 2 due to pt leaning posteriorly with her trunk despite multimodal cueing for anterior weight shift. Pt demos poor understanding of and ability to follow cues to assist with transfer. Upon initially sitting EOM pt is retropulsive and requires max A to maintain sitting balance, pt progresses to close SBA with sitting balance EOM with decreased pushing posteriorly. Sitting balance EOM reaching forwards for targets with alt UE to promote anterior weight shift, min to mod A to maintain sitting balance. SB transfer mat table to w/c with min A x 2 due to transferring downhill and pt exhibiting improved head/hips relationship with transfer. Stedy transfer w/c to bed with assist x 2. Pt left supine in bed with needs in reach, husband present, 4/4 bed rails in place,  telesitter present.  Therapy Documentation Precautions:  Precautions Precautions: Fall Precaution Comments: apraxic, somewhat impulsive Restrictions Weight Bearing Restrictions: No   Therapy/Group: Individual Therapy    Excell Seltzer, PT, DPT  03/20/2018, 8:59 AM

## 2018-03-20 NOTE — Progress Notes (Signed)
Occupational Therapy Session Note  Patient Details  Name: Maria Avila MRN: [id number] Date of Birth: 02/15/1951  Today's Date: 03/20/2018 OT Individual Time: 1050-1200 OT Individual Time Calculation (min): 70 min    Short Term Goals: Week 1:  OT Short Term Goal 1 (Week 1): Pt will complete bed mobility with max assist of one caregiver to decrease burden of care with bathing and dressing OT Short Term Goal 2 (Week 1): Pt will complete bathing with max assist  OT Short Term Goal 3 (Week 1): Pt will complete bed > w/c transfer to prepare for toilet transfers with max assist of one caregiver OT Short Term Goal 4 (Week 1): Pt will don pants with max assist  Skilled Therapeutic Interventions/Progress Updates:    Session focused on R attention and visual scanning, functional transfers, and postural control in sitting. Max A provided for bed mobility and positioning to use STEDY. Pt stood in stedy with mod +2 assistance for safety and was transferred to w/c. Pt required maximal multimodal cueing to use slideboard to transfer to mat toward R, also with max +2 A. Pt required frequent verbal cues for upright posture sitting EOM. Pt completed functional reaching activity to facilitate anterior weight shift- requiring max vc for visual scanning and core muscle activation. Max multimodal cueing for pt to scan to R side and locate item in R visual field. Pt transitioned to supine and engaged in B UE coordination task using 1lb weighted dowel. Min HOH provided on R UE to facilitate closed chain. Pt returned to w/c via slideboard to the L, requiring only mod A +1 assistance. Pt completed several rotations on B UE ergometer to promote R UE NMR. Pt returned to room and was left sitting up in w/c with lunch set up for and RN alerted to need for intermittent (S). Chair alarm belt fastened and telesitter present.   Therapy Documentation Precautions:  Precautions Precautions: Fall Precaution Comments: apraxic,  somewhat impulsive Restrictions Weight Bearing Restrictions: No Vital Signs: Therapy Vitals Temp: 98 F (36.7 C) Pulse Rate: (!) 101 Resp: 18 BP: (!) 85/69 Patient Position (if appropriate): Sitting Oxygen Therapy SpO2: 95 % Pain: Pain Assessment Pain Scale: 0-10 Pain Score: 0-No pain Pain Intervention(s): Medication (See eMAR)   Therapy/Group: Individual Therapy  Curtis Sites 03/20/2018, 12:30 PM

## 2018-03-20 NOTE — Progress Notes (Signed)
[place] PHYSICAL MEDICINE & REHABILITATION PROGRESS NOTE   Subjective/Complaints: Patient seen laying in bed this morning.  She states she did not sleep well overnight.  Overnight called by nursing regarding fall due to impulsivity and restlessness.  Head CT ordered.  Tele-sitter ordered.  This morning patient noted to have disorientation.  ROS: Unreliable due to cognition  Objective:   Ct Head Wo Contrast  Result Date: 03/20/2018 CLINICAL DATA:  Status post unwitnessed fall, with concern for head injury. Initial encounter. EXAM: CT HEAD WITHOUT CONTRAST TECHNIQUE: Contiguous axial images were obtained from the base of the skull through the vertex without intravenous contrast. COMPARISON:  CT of the head performed 03/09/2018, and MRI of the brain performed 03/10/2018 FINDINGS: Brain: Evolving subacute infarcts are noted bilaterally, better characterized on recent MRI. No definite hemorrhage is seen. No evidence of hydrocephalus, extra-axial collection or mass lesion. Mild periventricular white matter change likely reflects small vessel ischemic microangiopathy. Mild cerebellar atrophy is noted. The brainstem and fourth ventricle are within normal limits. The third and lateral ventricles, and basal ganglia are unremarkable in appearance. No midline shift is seen. Vascular: No hyperdense vessel or unexpected calcification. Skull: There is no evidence of fracture; visualized osseous structures are unremarkable in appearance. Sinuses/Orbits: The orbits are within normal limits. The paranasal sinuses and mastoid air cells are well-aerated. Other: Soft tissue swelling is noted at the posterior vertex. IMPRESSION: 1. Evolving subacute infarcts noted bilaterally, better characterized on recent MRI. No definite hemorrhage seen. 2. Mild small vessel ischemic microangiopathy. Mild cerebellar atrophy. Electronically Signed   By: Garald Balding M.D.   On: 03/20/2018 03:14   Recent Labs    03/18/18 1210  WBC  11.1*  HGB 9.6*  HCT 30.1*  PLT 351   Recent Labs    03/18/18 1210  NA 139  K 3.2*  CL 112*  CO2 24  GLUCOSE 89  BUN 7*  CREATININE 0.49  CALCIUM 8.3*    Intake/Output Summary (Last 24 hours) at 03/20/2018 1605 Last data filed at 03/20/2018 1400 Gross per 24 hour  Intake 942 ml  Output 455 ml  Net 487 ml     Physical Exam: Vital Signs Blood pressure (!) 126/96, pulse 100, temperature 97.7 F (36.5 C), resp. rate 18, SpO2 93 %. Constitutional: No distress . Vital signs reviewed. HENT: Normocephalic.  Atraumatic. Eyes: EOMI. No discharge. Cardiovascular: RRR. No JVD. Respiratory: CTA Bilaterally. Normal effort. GI: BS +. Non-distended. Musculoskeletal: She exhibits bilateral lower extremity edema Neurological: She is alert and oriented x1.  Delayed processing.  Motor: Limited due to ability to follow commands Skin: Sacral wound dressed. Left CT site dressing C/D/I Psychiatric: Restless  Assessment/Plan: 1. 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  Physiatrist is providing close team supervision and 24 hour management of active medical problems listed below.  Physiatrist and rehab team continue to assess barriers to discharge/monitor patient progress toward functional and medical goals  Care Tool:  Bathing              Bathing assist       Upper Body Dressing/Undressing Upper body dressing   What is the patient wearing?: Pull over shirt    Upper body assist Assist Level: Moderate Assistance - Patient 50 - 74%    Lower Body Dressing/Undressing Lower body dressing      What is the patient wearing?: Pants, Incontinence brief  Lower body assist Assist for lower body dressing: 2 Helpers     Toileting Toileting    Toileting assist Assist for toileting: Total Assistance - Patient < 25%     Transfers Chair/bed  transfer  Transfers assist     Chair/bed transfer assist level: 2 Helpers     Locomotion Ambulation   Ambulation assist   Ambulation activity did not occur: Safety/medical concerns(apraxia)  Assist level: Maximal Assistance - Patient 25 - 49% Assistive device: Sara Plus     Walk 10 feet activity   Assist           Walk 50 feet activity   Assist           Walk 150 feet activity   Assist           Walk 10 feet on uneven surface  activity   Assist           Wheelchair     Assist Will patient use wheelchair at discharge?: Yes Type of Wheelchair: Manual    Wheelchair assist level: Moderate Assistance - Patient 50 - 74% Max wheelchair distance: 25    Wheelchair 50 feet with 2 turns activity    Assist    Wheelchair 50 feet with 2 turns activity did not occur: Safety/medical concerns(fatigue)   Assist Level: Minimal Assistance - Patient > 75%   Wheelchair 150 feet activity     Assist Wheelchair 150 feet activity did not occur: Safety/medical concerns(fatigue)         Medical Problem List and Plan: 1.  Limitations in mobility, self-care, endurance secondary to L>R infarcts and multiple medical issues.  -Continue CIR  Repeat head CT reviewed, evolving stroke will consider repeat CT 2.  BLE DVTs/Anticoagulation: Pharmaceutical: Other (comment)-on Eliquis 3. Pain Management: tylenol prn.  4. Mood: LCSW to follow for evaluation and support.  5. Neuropsych: This patient is not capable of making decisions on her own behalf. 6. Skin/Wound Care: Air mattress due to sacral breakdown.   -nutrition/local skin care to chest/sacrum 7. Fluids/Electrolytes/Nutrition: Off TNA. Continue dysphagia 3 thin diet.  8. Empyema: Continue Levaquin for 3 weeks--end date 11/21.   -local care to CT site 9. Malnutrition/Heavy alcohol use: Poor intake with 40 lbs weight loss.   -B12 and folate levels normal 10. Diarrhea: Added probiotic and fiber.  Discontinued Ensure and added prostat.    -rectal tube out  -stool still loose but becoming more formed. Likely abx effect  -no indications that it's infectious although WBC sl elevated (11.1 on 11/15)  -added imodium  -recheck CBC Monday 11. Hypokalemia: Likely due to nutritional deficits as well as diarrhea.   -Potassium supplement  Repeat labs ordered  Labs ordered for Monday 12. Leucocytosis with neutropenia: Continue to monitor.   Repeat labs ordered   Labs ordered for Monday 13.  Acute blood loss anemia  Hb 9.6 on 11/14  Cont to monitor   LOS: 3 days A FACE TO FACE EVALUATION WAS PERFORMED  Yuya Vanwingerden Lorie Phenix 03/20/2018, 4:05 PM

## 2018-03-20 NOTE — Progress Notes (Signed)
Spoke with on call MD, Dr. Posey Pronto about patient's low BP; patient alert and oriented and asymptomatic; family at bedside and telemonitoring in place. No new orders at this time;Will recheck  BP and initiated orders as ordered by MD if patient BP low; Will continue to monitor patient

## 2018-03-20 NOTE — IPOC Note (Addendum)
Overall Plan of Care [facility]) Patient Details Name: Maria Avila MRN: [id number] DOB: [birth year]  Admitting Diagnosis: Multifocal CVA  Hospital Problems: Active Problems:   Cerebral embolism with cerebral infarction   Stroke due to embolism ([place])   Acute blood loss anemia   Impulsive     Functional Problem List: Nursing Bladder, Bowel, Endurance, Medication Management, Motor, Nutrition, Safety, Sensory, Skin Integrity  PT Balance, Edema, Endurance, Motor, Pain, Safety, Skin Integrity  OT Balance, Cognition, Endurance, Motor, Nutrition, Pain, Perception, Safety, Skin Integrity, Vision  SLP Cognition, Endurance, Nutrition  TR         Basic ADL's: OT Grooming, Bathing, Toileting, Dressing, Eating     Advanced  ADL's: OT       Transfers: PT Bed Mobility, Bed to Chair, Car, Manufacturing systems engineer, Metallurgist: PT Ambulation, Emergency planning/management officer, Stairs     Additional Impairments: OT    SLP Swallowing, Social Cognition   Problem Solving, Memory, Attention, Awareness  TR      Anticipated Outcomes Item Anticipated Outcome  Self Feeding Supervision/setup  Swallowing  Mod I with least restrictive diet   Basic self-care  Min -mod assist  Toileting  Min assist   Bathroom Transfers Min assist  Bowel/Bladder  Min assist  Transfers  S basic, min assist car  Locomotion  S transfer and w/c propulsion 150'; gait and stairs TBD  Communication     Cognition  Min A  Pain  < 4  Safety/Judgment  Min assist   Therapy Plan: PT Intensity: Minimum of 1-2 x/day ,45 to 90 minutes PT Frequency: 5 out of 7 days PT Duration Estimated Length of Stay: 21-24 days OT Intensity: Minimum of 1-2 x/day, 45 to 90 minutes OT Frequency: 5 out of 7 days OT Duration/Estimated Length of Stay: 24-27 days SLP Intensity: Minumum of 1-2 x/day, 30 to 90 minutes SLP Frequency: 3 to 5 out of 7 days SLP Duration/Estimated Length of Stay: 2.5 to 3 weeks    Team  Interventions: Nursing Interventions Patient/Family Education, Dysphagia/Aspiration Precaution Training, Bladder Management, Medication Management, Discharge Planning, Bowel Management, Skin Care/Wound Management, Disease Management/Prevention, Cognitive Remediation/Compensation  PT interventions Ambulation/gait training, Community reintegration, DME/adaptive equipment instruction, Neuromuscular re-education, Psychosocial support, Stair training, UE/LE Strength taining/ROM, Wheelchair propulsion/positioning, UE/LE Coordination activities, Therapeutic Activities, Pain management, Functional electrical stimulation, Discharge planning, Training and development officer, Cognitive remediation/compensation, Functional mobility training, Patient/family education, Splinting/orthotics, Therapeutic Exercise, Visual/perceptual remediation/compensation  OT Interventions Balance/vestibular training, Cognitive remediation/compensation, Discharge planning, Disease mangement/prevention, DME/adaptive equipment instruction, Functional mobility training, Neuromuscular re-education, Pain management, Patient/family education, Psychosocial support, Self Care/advanced ADL retraining, Skin care/wound managment, Splinting/orthotics, Therapeutic Activities, Therapeutic Exercise, UE/LE Strength taining/ROM, UE/LE Coordination activities, Visual/perceptual remediation/compensation, Wheelchair propulsion/positioning  SLP Interventions Cognitive remediation/compensation, Cueing hierarchy, Dysphagia/aspiration precaution training, Functional tasks, Patient/family education, Therapeutic Activities  TR Interventions    SW/CM Interventions Discharge Planning, Psychosocial Support, Patient/Family Education   Barriers to Discharge MD  Medical stability and Behavior  Nursing      PT      OT Home environment access/layout has 4 steps to enter with no rails  SLP Medical stability    SW       Team Discharge Planning: Destination: PT-Home  ,OT- Home , SLP-Home Projected Follow-up: PT-24 hour supervision/assistance, OT-  Home health OT, 24 hour supervision/assistance, SLP-Home Health SLP, 24 hour supervision/assistance Projected Equipment Needs: PT-To be determined, OT- 3 in 1 bedside comode, Tub/shower bench, SLP-None recommended by SLP Equipment Details: PT- , OT-  Patient/family involved in  discharge planning: PT- Patient, Family member/caregiver,  OT-Family member/caregiver, Patient, SLP-Patient  MD ELOS: 20-25 days. Medical Rehab Prognosis:  Good Assessment: Maria Avila with history of T2DM, HTN, history of heavy alcohol use,lymphocytosis/leucopenia, colitis with 6 month year history of N/V/diarrhea/anorexia with 40 lbs weigh loss and abdominal pain who was admitted on 02/16/18 wwith negative extensive work up and was discharged to SNF for rehab. She was readmitted on 03/04/18 with mental status changes, hypoxia and tachycardia due to sepsis with near complete opacification of left chest due to empyema. She was intubated, left chest tube placed with drainage of purulent material and was started on IV antibiotics. Dr. Roxan Hockey recommended gastrografin swallow which was negative for perforation and he felt that empyema likely due to aspiration PNA. Brain MRI reviewed, showing mulitple CVAs. Per report, bilateral multiple CVA, greatest in left parietal lobe. Chest tube treated with tPA for thrombolysis and to help with reexpansion of lung and discontinued on 11/9 as non functional. Culture positive for E coli, enterococcus and pseudomonas aeruginosa and antibiotics narrowed to Levaquin with stop date 11/21. Patient with resulting functional deficits with mobility, self-care, cognition.  We will set goals for min/mod assist with PT/OT/SLP.   See Team Conference Notes for weekly updates to the plan of care

## 2018-03-21 ENCOUNTER — Inpatient Hospital Stay (HOSPITAL_COMMUNITY): Payer: Self-pay

## 2018-03-21 ENCOUNTER — Inpatient Hospital Stay (HOSPITAL_COMMUNITY): Payer: Self-pay | Admitting: Physical Therapy

## 2018-03-21 DIAGNOSIS — E876 Hypokalemia: Secondary | ICD-10-CM

## 2018-03-21 LAB — GLUCOSE, CAPILLARY
Glucose-Capillary: 80 mg/dL (ref 70–99)
Glucose-Capillary: 62 mg/dL — ABNORMAL LOW (ref 70–99)
Glucose-Capillary: 106 mg/dL — ABNORMAL HIGH (ref 70–99)

## 2018-03-21 LAB — VITAMIN B1: Vitamin B1 (Thiamine): 157.9 nmol/L (ref 66.5–200.0)

## 2018-03-21 NOTE — Progress Notes (Signed)
[facility] PHYSICAL MEDICINE & REHABILITATION PROGRESS NOTE   Subjective/Complaints: Patient seen laying in bed this morning.  She slept on and off overnight per report, improved from previous night.  ROS: Unreliable due to cognition  Objective:   Ct Head Wo Contrast  Result Date: 03/20/2018 CLINICAL DATA:  Status post unwitnessed fall, with concern for head injury. Initial encounter. EXAM: CT HEAD WITHOUT CONTRAST TECHNIQUE: Contiguous axial images were obtained from the base of the skull through the vertex without intravenous contrast. COMPARISON:  CT of the head performed 03/09/2018, and MRI of the brain performed 03/10/2018 FINDINGS: Brain: Evolving subacute infarcts are noted bilaterally, better characterized on recent MRI. No definite hemorrhage is seen. No evidence of hydrocephalus, extra-axial collection or mass lesion. Mild periventricular white matter change likely reflects small vessel ischemic microangiopathy. Mild cerebellar atrophy is noted. The brainstem and fourth ventricle are within normal limits. The third and lateral ventricles, and basal ganglia are unremarkable in appearance. No midline shift is seen. Vascular: No hyperdense vessel or unexpected calcification. Skull: There is no evidence of fracture; visualized osseous structures are unremarkable in appearance. Sinuses/Orbits: The orbits are within normal limits. The paranasal sinuses and mastoid air cells are well-aerated. Other: Soft tissue swelling is noted at the posterior vertex. IMPRESSION: 1. Evolving subacute infarcts noted bilaterally, better characterized on recent MRI. No definite hemorrhage seen. 2. Mild small vessel ischemic microangiopathy. Mild cerebellar atrophy. Electronically Signed   By: Garald Balding M.D.   On: 03/20/2018 03:14   Recent Labs    03/18/18 1210 03/20/18 1826  WBC 11.1* 8.6  HGB 9.6* 9.0*  HCT 30.1* 27.5*  PLT 351 308   Recent Labs    03/18/18 1210 03/20/18 1826  NA 139 139  K 3.2*  3.5  CL 112* 112*  CO2 24 23  GLUCOSE 89 88  BUN 7* 10  CREATININE 0.49 0.71  CALCIUM 8.3* 8.2*    Intake/Output Summary (Last 24 hours) at 03/21/2018 0741 Last data filed at 03/20/2018 2203 Gross per 24 hour  Intake 942 ml  Output 855 ml  Net 87 ml     Physical Exam: Vital Signs Blood pressure 134/89, pulse (!) 101, temperature 97.7 F (36.5 C), resp. rate 16, SpO2 100 %. Constitutional: No distress . Vital signs reviewed. HENT: Normocephalic.  Atraumatic. Eyes: EOMI. No discharge. Cardiovascular: RRR.  No JVD. Respiratory: CTA bilaterally.  Normal effort. GI: BS +. Non-distended. Musculoskeletal: She exhibits bilateral lower extremity edema Neurological: She is alert and oriented x1.  Delayed processing.  Motor: Limited due to ability to follow commands, moving extremities freely Skin: Sacral wound dressed. Left CT site dressing C/D/I Psychiatric: Restless.  Confused.  Assessment/Plan: 1. 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  Physiatrist is providing close team supervision and 24 hour management of active medical problems listed below.  Physiatrist and rehab team continue to assess barriers to discharge/monitor patient progress toward functional and medical goals  Care Tool:  Bathing              Bathing assist       Upper Body Dressing/Undressing Upper body dressing   What is the patient wearing?: Pull over shirt    Upper body assist Assist Level: Moderate Assistance - Patient 50 - 74%    Lower Body Dressing/Undressing Lower body dressing      What is the patient wearing?: Pants, Incontinence brief  Lower body assist Assist for lower body dressing: 2 Helpers     Toileting Toileting    Toileting assist Assist for toileting: Total Assistance - Patient < 25%     Transfers Chair/bed transfer  Transfers assist      Chair/bed transfer assist level: 2 Helpers     Locomotion Ambulation   Ambulation assist   Ambulation activity did not occur: Safety/medical concerns(apraxia)  Assist level: Maximal Assistance - Patient 25 - 49% Assistive device: Sara Plus     Walk 10 feet activity   Assist           Walk 50 feet activity   Assist           Walk 150 feet activity   Assist           Walk 10 feet on uneven surface  activity   Assist           Wheelchair     Assist Will patient use wheelchair at discharge?: Yes Type of Wheelchair: Manual    Wheelchair assist level: Moderate Assistance - Patient 50 - 74% Max wheelchair distance: 25    Wheelchair 50 feet with 2 turns activity    Assist    Wheelchair 50 feet with 2 turns activity did not occur: Safety/medical concerns(fatigue)   Assist Level: Minimal Assistance - Patient > 75%   Wheelchair 150 feet activity     Assist Wheelchair 150 feet activity did not occur: Safety/medical concerns(fatigue)         Medical Problem List and Plan: 1.  Limitations in mobility, self-care, endurance secondary to L>R infarcts and multiple medical issues.  -Continue CIR  Repeat head CT reviewed, evolving stroke will consider repeat CT if necessary, however appears to be stabilizing 2.  BLE DVTs/Anticoagulation: Pharmaceutical: Other (comment)-on Eliquis 3. Pain Management: tylenol prn.  4. Mood: LCSW to follow for evaluation and support.  5. Neuropsych: This patient is not capable of making decisions on her own behalf. 6. Skin/Wound Care: Air mattress due to sacral breakdown.   -nutrition/local skin care to chest/sacrum 7. Fluids/Electrolytes/Nutrition: Off TNA. Continue dysphagia 3 thin diet.   BMP within acceptable range on 11/16 8. Empyema: Continue Levaquin for 3 weeks--end date 11/21.   -local care to CT site 9. Malnutrition/Heavy alcohol use: Poor intake with 40 lbs weight loss.   -B12 and folate  levels normal 10. Diarrhea: Added probiotic and fiber. Discontinued Ensure and added prostat.    -rectal tube out  -stool still loose but becoming more formed. Likely abx effect  -no indications that it's infectious although WBC sl elevated (11.1 on 11/15)  -added imodium 11. Hypokalemia: Likely due to nutritional deficits as well as diarrhea.   -Potassium supplement  Repeat labs ordered  Potassium 3.5 on 11/16  Continue to monitor 12. Leucocytosis with neutropenia: Resolved  Continue to monitor.  13.  Acute blood loss anemia  Hb 9.0 on 11/16  Cont to monitor   LOS: 4 days A FACE TO FACE EVALUATION WAS PERFORMED  Maria Avila Lorie Phenix 03/21/2018, 7:41 AM

## 2018-03-21 NOTE — Progress Notes (Signed)
Physical Therapy Session Note  Patient Details  Name: Shonna E Martinique MRN: [id number] Date of Birth: 1950-11-16  Today's Date: 03/21/2018 PT Individual Time: 1016-1110 PT Individual Time Calculation (min): 54 min   Short Term Goals: Week 1:  PT Short Term Goal 1 (Week 1): pt will move supine>< sit with min assist PT Short Term Goal 2 (Week 1): pt will transfer bed>< w/c with assistance of 1 person PT Short Term Goal 3 (Week 1): pt will propel w/c x 50' with min assist PT Short Term Goal 4 (Week 1): pt will initiate gait with assistance of 2 persons  Skilled Therapeutic Interventions/Progress Updates: Pt presented in recliner oriented to self and attempted to unstrap alarm belt. Pt re-oriented and agreeable to therapy. Pt required +2 assist with [place] to transfer to w/c. Pt transported to rehab gym. Pt was able to scoot anteriorly in w/c for SB placement and minA and performed SB transfer maxA x 2 to mat. During transfer pt unable to correct posterior lean despite max multimodal cues. Participated in sitting reaching activities with emphasis on anterior lean including placing horseshoes on basketball hoop and placing/retreiving cups on bench. Pt noted to be more successful with bench activities than horseshoes. During activities pt noted to initially require mod/maxA for sitting balance however improved to min near CGA with verbal cues for correction. Pt participated in STS from [place] and from elevated mat x 5 with pt initially max near total A for stand fading to modA x 1 when PTA used sheet for facilitation of anterior hip translation. Once pt returned to w/c participated in w/c mobility with pt requiring mod to maxA with [facility] assist of LUE for sequencing. Pt with difficulty today coordinating grasping and releasing of wheel rim for propulsion. Pt then transported to day room and set up with dance group with staff present.      Therapy Documentation Precautions:  Precautions Precautions:  Fall Precaution Comments: apraxic, somewhat impulsive Restrictions Weight Bearing Restrictions: No    Therapy/Group: Individual Therapy  Coty Larsh  Sofya Moustafa, PTA  03/21/2018, 12:45 PM

## 2018-03-21 NOTE — Progress Notes (Signed)
+/-   sleep. A & O to self. PRN trazodone 25mg  given at 2106. At 2200, I & O cath=400cc's. 2 mushy incontinent stools, PRN imodium given at 0438. At 0445, continent void on bedpan, bladder scan=176cc's. Patrici Ranks A

## 2018-03-21 NOTE — Plan of Care (Signed)
  Problem: Consults Goal: RH STROKE PATIENT EDUCATION Description See Patient Education module for education specifics  Outcome: Progressing Goal: Nutrition Consult-if indicated Outcome: Progressing   Problem: RH BLADDER ELIMINATION Goal: RH STG MANAGE BLADDER WITH ASSISTANCE Description STG Manage Bladder With mod Assistance  Outcome: Progressing   Problem: RH SKIN INTEGRITY Goal: RH STG MAINTAIN SKIN INTEGRITY WITH ASSISTANCE Description STG Maintain Skin Integrity With mod Assistance.  Outcome: Progressing Goal: RH STG ABLE TO PERFORM INCISION/WOUND CARE W/ASSISTANCE Description STG Able To Perform Incision/Wound Care With total Assistance from caregiver.  Outcome: Progressing   Problem: RH SAFETY Goal: RH STG ADHERE TO SAFETY PRECAUTIONS W/ASSISTANCE/DEVICE Description STG Adhere to Safety Precautions With min Assistance/Device.  Outcome: Progressing   Problem: RH KNOWLEDGE DEFICIT Goal: RH STG INCREASE KNOWLEDGE OF HYPERTENSION Description Min assist.  Outcome: Progressing Goal: RH STG INCREASE KNOWLEDGE OF DYSPHAGIA/FLUID INTAKE Description Min assist  Outcome: Progressing Goal: RH STG INCREASE KNOWLEGDE OF HYPERLIPIDEMIA Description Min assist  Outcome: Progressing Goal: RH STG INCREASE KNOWLEDGE OF STROKE PROPHYLAXIS Description Min assist  Outcome: Progressing

## 2018-03-22 ENCOUNTER — Inpatient Hospital Stay (HOSPITAL_COMMUNITY): Payer: Self-pay | Admitting: Physical Therapy

## 2018-03-22 ENCOUNTER — Inpatient Hospital Stay (HOSPITAL_COMMUNITY): Payer: Self-pay | Admitting: Occupational Therapy

## 2018-03-22 ENCOUNTER — Inpatient Hospital Stay (HOSPITAL_COMMUNITY): Payer: Medicare Other

## 2018-03-22 ENCOUNTER — Inpatient Hospital Stay (HOSPITAL_COMMUNITY): Payer: Self-pay

## 2018-03-22 DIAGNOSIS — L89303 Pressure ulcer of unspecified buttock, stage 3: Secondary | ICD-10-CM

## 2018-03-22 DIAGNOSIS — J869 Pyothorax without fistula: Secondary | ICD-10-CM

## 2018-03-22 DIAGNOSIS — I63412 Cerebral infarction due to embolism of left middle cerebral artery: Secondary | ICD-10-CM

## 2018-03-22 DIAGNOSIS — R569 Unspecified convulsions: Secondary | ICD-10-CM

## 2018-03-22 DIAGNOSIS — I63133 Cerebral infarction due to embolism of bilateral carotid arteries: Secondary | ICD-10-CM

## 2018-03-22 LAB — GLUCOSE, CAPILLARY
GLUCOSE-CAPILLARY: 82 mg/dL (ref 70–99)
GLUCOSE-CAPILLARY: 79 mg/dL (ref 70–99)
Glucose-Capillary: 60 mg/dL — ABNORMAL LOW (ref 70–99)
Glucose-Capillary: 76 mg/dL (ref 70–99)

## 2018-03-22 MED ORDER — PIPERACILLIN-TAZOBACTAM 3.375 G IVPB
3.3750 g | Freq: Three times a day (TID) | INTRAVENOUS | Status: AC
  Administered 2018-03-23 – 2018-03-25 (×9): 3.375 g via INTRAVENOUS
  Filled 2018-03-22 (×9): qty 50

## 2018-03-22 MED ORDER — LEVETIRACETAM 500 MG PO TABS
500.0000 mg | ORAL_TABLET | Freq: Two times a day (BID) | ORAL | Status: DC
  Administered 2018-03-22 (×2): 500 mg via ORAL
  Filled 2018-03-22 (×2): qty 1

## 2018-03-22 MED ORDER — QUETIAPINE FUMARATE 25 MG PO TABS
25.0000 mg | ORAL_TABLET | Freq: Every day | ORAL | Status: DC
  Administered 2018-03-22 – 2018-03-23 (×2): 25 mg via ORAL
  Filled 2018-03-22 (×2): qty 1

## 2018-03-22 MED ORDER — PIPERACILLIN-TAZOBACTAM 3.375 G IVPB 30 MIN: 3.3750 g | Freq: Three times a day (TID) | INTRAVENOUS | Status: DC

## 2018-03-22 MED ORDER — SODIUM CHLORIDE 0.9% FLUSH
10.0000 mL | INTRAVENOUS | Status: DC | PRN
  Administered 2018-03-22 – 2018-03-30 (×4): 10 mL
  Filled 2018-03-22 (×4): qty 40

## 2018-03-22 NOTE — Consult Note (Signed)
[facility] Nurse wound consult note Reason for Consult: sacral wound WOC nurse team evaluated this patient on 03/11/18 at that time appears she had diffuse MASD over the ischial and buttock region. She continues to have incontinence of bowel and bladder with frequent stooling. Today she has stool and a brief in place. She is on a Total lift bed and is being transitioned to a low air loss mattress today.  Wound type: Unstageable pressure injury: sacrum Multiple areas of moisture associated skin damage over the buttocks, ischium, and inner thighs Pressure Injury POA: No Measurement: Sacrum: 4cm x 9cm x 0.1cm  Wound bed: Sacrum: 95% yellow thin slough/5% pink. Evidence of re-epithelialization over the buttocks Scattered partial thickness skin loss as described above, all areas are pink and moist.  Drainage (amount, consistency, odor) none Periwound: intact, as described above Dressing procedure/placement/frequency:  continue enzymatic debridement ointment Continue low air loss mattress for moisture management and pressure redistribution. Only use incontinence briefs when up out of bed Barrier cream to the buttocks and inner thigh to protect from further insult from incontinence Add PT for hydrotherapy to attempt to clean sacral wound a little faster than the enzyme is working.  Maximize nutrition for wound healing.   [place] nurse contacted PT department to notify for new orders for hydrotherapy.   [facility] Nurse team will follow with you and see patient within 10 days for wound assessments.  Please notify [facility] nurses of any acute changes in the wounds or any new areas of concern [place] MSN, [place], [place], [place]

## 2018-03-22 NOTE — Progress Notes (Signed)
Discussed patient with Dr. Erlinda Hong. He reviewed CT--no bleed noted with evolving stroke. Patient changed to Levaquin at admission which can decrease seizure threshold--antibiotic changed to Zosyn for 2 additional days. He did relay that patient did have asterixis due to sever encephalopathy but is at risk for seizures due to her stroke. Keppra bid a good choice--can change to Vimpat if she has sedative SE.

## 2018-03-22 NOTE — Progress Notes (Addendum)
PHYSICAL MEDICINE & REHABILITATION PROGRESS NOTE   Subjective/Complaints: Pt awake and oriented to [facility] and self.  Was not aware of CVA (had hearth attack). No problem with breathing.  Incont of urine although per RN does ok when the female urinal is offered  When turned pt exhibits increase extensor  tone and shaking in BUE and BLE ROS: Unreliable due to cognition  Objective:   No results found. Recent Labs    03/20/18 1826  WBC 8.6  HGB 9.0*  HCT 27.5*  PLT 308   Recent Labs    03/20/18 1826  NA 139  K 3.5  CL 112*  CO2 23  GLUCOSE 88  BUN 10  CREATININE 0.71  CALCIUM 8.2*    Intake/Output Summary (Last 24 hours) at 03/22/2018 0757 Last data filed at 03/21/2018 2300 Gross per 24 hour  Intake 762 ml  Output 2200 ml  Net -1438 ml     Physical Exam: Vital Signs Blood pressure (!) 147/88, pulse 99, temperature 98.7 F (37.1 C), resp. rate 16, SpO2 100 %. Constitutional: No distress . Vital signs reviewed. HENT: Normocephalic.  Atraumatic. Eyes: EOMI. No discharge. Cardiovascular: RRR.  No JVD. Respiratory: crackles at Left base no resp distress, no wheezes GI: BS +. Non-distended. Musculoskeletal: She exhibits bilateral lower extremity edema Neurological: She is alert and oriented x1.  Delayed processing.  Motor: 4/5 bue and ble  Skin: Sacral wound dressed. Left CT site dried blood , 2 sutures, Gr 3 skin breakdown over lower costal margin and midline sacral Psychiatric: Restless.  Confused.  Assessment/Plan: 1. 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  Physiatrist is providing close team supervision and 24 hour management of active medical problems listed below.  Physiatrist and rehab team continue to assess barriers to discharge/monitor patient progress toward functional and medical goals  Care Tool:  Bathing             Bathing assist       Upper Body Dressing/Undressing Upper body dressing   What is the patient wearing?: Pull over shirt    Upper body assist Assist Level: Moderate Assistance - Patient 50 - 74%    Lower Body Dressing/Undressing Lower body dressing      What is the patient wearing?: Pants, Incontinence brief     Lower body assist Assist for lower body dressing: 2 Helpers     Toileting Toileting    Toileting assist Assist for toileting: Total Assistance - Patient < 25%     Transfers Chair/bed transfer  Transfers assist     Chair/bed transfer assist level: 2 Helpers     Locomotion Ambulation   Ambulation assist   Ambulation activity did not occur: Safety/medical concerns(apraxia)  Assist level: Maximal Assistance - Patient 25 - 49% Assistive device: Sara Plus     Walk 10 feet activity   Assist           Walk 50 feet activity   Assist           Walk 150 feet activity   Assist           Walk 10 feet on uneven surface  activity   Assist           Wheelchair     Assist Will patient use wheelchair at discharge?: Yes Type of Wheelchair: Manual    Wheelchair assist level: Maximal Assistance - Patient 25 - 49%  Max wheelchair distance: 15    Wheelchair 50 feet with 2 turns activity    Assist    Wheelchair 50 feet with 2 turns activity did not occur: Safety/medical concerns(fatigue)   Assist Level: Minimal Assistance - Patient > 75%   Wheelchair 150 feet activity     Assist Wheelchair 150 feet activity did not occur: Safety/medical concerns(fatigue)         Medical Problem List and Plan: 1.  Limitations in mobility, self-care, endurance secondary to L>R infarcts and multiple medical issues.  -Continue CIR  Repeat head CT ordered to r/o hemorrhagic conversion given MS changes (increased confusion) and AMS with witnessed short seizure, generalized.  Also starting po Keppra 2.  BLE DVTs/Anticoagulation:  Pharmaceutical: Other (comment)-on Eliquis 3. Pain Management: tylenol prn.  4. Mood: LCSW to follow for evaluation and support.  5. Neuropsych: This patient is not capable of making decisions on her own behalf. 6. Skin/Wound Care: Air mattress due to sacral breakdown.   -nutrition/local skin care to chest/sacrum 7. Fluids/Electrolytes/Nutrition: Off TNA. Continue dysphagia 3 thin diet.   BMP within acceptable range on 11/16 8. Empyema: Continue Levaquin for 3 weeks--end date 11/21.   -local care to CT site, Needs IS for atelectasis 9. Malnutrition/Heavy alcohol use: Poor intake with 40 lbs weight loss.   -B12 and folate levels normal 10. Diarrhea: Added probiotic and fiber. Discontinued Ensure and added prostat.    -rectal tube out  -stool still loose but becoming more formed. Likely abx effect  -no indications that it's infectious although WBC sl elevated (11.1 on 11/15)  -added imodium 11. Hypokalemia: Likely due to nutritional deficits as well as diarrhea.   -Potassium supplement  Repeat labs ordered  Potassium 3.5 on 11/16  Continue to monitor 12. Leucocytosis with neutropenia: Resolved  Continue to monitor.  13.  Acute blood loss anemia  Hb 9.0 on 11/16  Cont to monitor   14.  Probable post CVA seizure, will ask neuro to f/u on STAT CT head LOS: 5 days A FACE TO FACE EVALUATION WAS PERFORMED  Charlett Blake 03/22/2018, 7:57 AM

## 2018-03-22 NOTE — Progress Notes (Signed)
Awake ALL night. Confused. Yelling out for family. Voided 600cc's in female urinal this AM. Small incontinent BM. Maria Avila A

## 2018-03-22 NOTE — Significant Event (Signed)
Hypoglycemic Event  CBG: 60  Treatment: 15 GM carbohydrate snack  Symptoms: None  Follow-up CBG: [id number] CBG Result:82  Possible Reasons for Event: Unknown  Comments/MD notified:Treated per protocol.    Maria Avila A

## 2018-03-22 NOTE — Progress Notes (Signed)
EEG Completed; Results Pending  

## 2018-03-22 NOTE — Progress Notes (Signed)
Occupational Therapy Session Note  Patient Details  Name: Maria Avila MRN: [id number] Date of Birth: [birth year]  Today's Date: 03/22/2018 OT Individual Time: 6256-3893 OT Individual Time Calculation (min): 40 min    Short Term Goals: Week 1:  OT Short Term Goal 1 (Week 1): Pt will complete bed mobility with max assist of one caregiver to decrease burden of care with bathing and dressing OT Short Term Goal 2 (Week 1): Pt will complete bathing with max assist  OT Short Term Goal 3 (Week 1): Pt will complete bed > w/c transfer to prepare for toilet transfers with max assist of one caregiver OT Short Term Goal 4 (Week 1): Pt will don pants with max assist  Skilled Therapeutic Interventions/Progress Updates:    Treatment session with focus on bed mobility and sit > stand to decrease burden of care with LB dressing and increase standing posture/balance.  Pt received upright in bed eating breakfast.  Pt completed breakfast with intermittent use of utensil and self-feeding with hands.  Completed LB dressing with total assist with pt demonstrating mild improvements in bed mobility, however continues to require max multimodal cues for initiation and sequencing of rolling.  Mod assist sidelying to sitting at EOB. Transferred bed > w/c via Stedy with +2 for safety.  Engaged in blocked practice sit > stand at [facility] with +2 for safety, pt requiring min-max assist for anterior weight shift.  Utilized [employer]ologist, engineering for [employer] as pt tends to demonstrate posterior lean in standing and during sit > stand.  Engaged in reaching activity in standing in [place] to provide support to BLE while focusing on upright standing posture.  Incorporated reaching in to standing activity to further facilitate upright standing posture.  Session terminated early due to arrival of transfer to take pt to CT.  Pt returned to supine in bed via Stedy.  Pt missed 20 mins due to off unit for CT scan.  Therapy Documentation Precautions:   Precautions Precautions: Fall Precaution Comments: apraxic, somewhat impulsive Restrictions Weight Bearing Restrictions: No General: General OT Amount of Missed Time: 20 Minutes PT Missed Treatment Reason: (CT/EEG) Vital Signs: Therapy Vitals Pulse Rate: 92 Resp: 17 BP: (!) 134/95 Patient Position (if appropriate): Lying Oxygen Therapy SpO2: 100 % O2 Device: Room Air Pain: Pain Assessment Pain Scale: 0-10 Pain Score: 0-No pain Faces Pain Scale: No hurt   Therapy/Group: Individual Therapy  Simonne Come 03/22/2018, 11:42 AM

## 2018-03-22 NOTE — Progress Notes (Signed)
Speech Language Pathology Daily Session Note  Patient Details  Name: Maria Avila MRN: [id number] Date of Birth: 06/21/1950  Today's Date: 03/22/2018 SLP Individual Time: 1300-1345 SLP Individual Time Calculation (min): 45 min  Short Term Goals: Week 1: SLP Short Term Goal 1 (Week 1): Pt will utilize compensatory memory aids to recall orientation information with Min A cues.  SLP Short Term Goal 2 (Week 1): Pt will sustain attention to basic task for ~ 5 minutes with Mod A cues.  SLP Short Term Goal 3 (Week 1): Pt will complete basic problem solving tasks with Min A cues.  SLP Short Term Goal 4 (Week 1): Pt will demonstrate intellectual awareness by listing 2 physical and 2 cognitive deficits related to acute illness with Mod A cues.  SLP Short Term Goal 5 (Week 1): Pt will consume current diet without overt s/s of dysphagia/aspiration with supervision cues for use of compensatory swallow strategies.  SLP Short Term Goal 6 (Week 1): Pt will consume trials of regular diet textures with minimal s/s of dysphagia/aspiration with Min A cues for use of compensatory swallow strategies.   Skilled Therapeutic Interventions:Skilled ST services focused on swallow and cognitive skills. SLP facilitated PO consumption of dys 3 lunch tray, (SLP ordered regular however came up as dys 3), pt required max A verbal cues for basic problem solving with no overt s/s aspiartion. Pt demonstrated increase language of confusion and reduced focus attention, requiring max A verbal cues to keep eyes open towards end of session. Pt would request items and then refuse them, example asking to "check teeth", SLP presented mirror, however pt unable to demonstrate focused attention and asked "move that way." SLP facilitated orientation of place, time and situation pt required mod A verbal cues. SLP facilitated basic problem solving sorting shapes by color in a field of 4, pt required max-mod A verbal cues. Pt was left in room with  husband and bed alarm set. Recommend to continue skilled ST services.       Pain Pain Assessment Pain Score: 0-No pain  Therapy/Group: Individual Therapy  Revan Gendron  [facility] 03/22/2018, 2:44 PM

## 2018-03-22 NOTE — Procedures (Signed)
ELECTROENCEPHALOGRAM REPORT   Patient: Maria Avila       Room #: [id number] EEG No. ID: [id number] Age: 67 y.o.        Sex: female Referring Physician: Kirsteins Report Date:  03/22/2018        Interpreting Physician: Alexis Goodell  History: Maria Avila is an 67 y.o. female with septic shock and right sided weakness  Medications:  Eliquis, Santyl, Pepcid, Floranex, Keppra, MVI, Zosyn, Fibercon, K-dur, Seroquel, Thiamine  Conditions of Recording:  This is a 21 channel routine scalp EEG performed with bipolar and monopolar montages arranged in accordance to the international 10/20 system of electrode placement. One channel was dedicated to EKG recording.  The patient is in the poorly cooperative state.  Description:  Artifact is prominent during the recording often obscuring the background rhythm. When able to be visualized the background is slow and poorly organized. On these rare occasions the background consists of a low voltage mixture of delta and theta activity.   No epileptiform activity is noted.  Hyperventilation and intermittent photic stimulation were not performed.   IMPRESSION: This is an abnormal EEG secondary to posterior background slowing.  This finding may be seen with a diffuse gray matter disturbance that is etiologically nonspecific, but may include a dementia, among other possibilities.   EEG is suboptimal due to the predominance of muscle and movement artifact.     Alexis Goodell, MD Neurology [phone] 03/22/2018, 1:23 PM

## 2018-03-22 NOTE — Progress Notes (Signed)
PRN imodium and trazodone 50mg  given at 2053. Patient has been restless and confused all night, unable to reorient. Several Attempts OOB, without assistance. Telesitter in use for safety. Incontinent of stool x 2, prn imodium given. At 2300, continent void on bedpan, bladder scan=57cc's. At 0200, continent void on bedpan, bladder scan=36cc's. At [place], [place] void using female urinal, bladder scan=146. Dressing changed to sacrum, 85-90% slough tissue. Husband reports patient more confused over past  Couple of days. He also reports, patient will not wear PRAFO boots. Bilateral heels elevated off bed with pillows. Difficult to keep patient turned onto side, she readjusts back on back. Patrici Ranks A

## 2018-03-22 NOTE — Progress Notes (Signed)
Occupational Therapy Note  Patient Details  Name: Maria Avila MRN: [id number] Date of Birth: 1951-03-22  Today's Date: 03/22/2018 OT Missed Time: 46 Minutes Missed Time Reason: Patient unwilling/refused to participate without medical reason  Pt missed 30 mins scheduled OT treatment session.  Pt agitated and fidgeting with shirt and blankets, speaking incoherently.  Pt stating "not right now" when encouraged to participate in treatment session.  PA and nursing staff informed of pt status and refusal.   Izic Stfort, [facility] 03/22/2018, 3:46 PM

## 2018-03-22 NOTE — Progress Notes (Signed)
Physical Therapy Note  Patient Details  Name: Maria Avila MRN: [id number] Date of Birth: [birth year] Today's Date: 03/22/2018    Pt missed 60 minutes of skilled PT treatment 2/2 nursing care then being off floor for EEG.    [name] 03/22/2018, 11:35 AM

## 2018-03-23 ENCOUNTER — Inpatient Hospital Stay (HOSPITAL_COMMUNITY): Payer: Self-pay | Admitting: Physical Therapy

## 2018-03-23 ENCOUNTER — Ambulatory Visit (HOSPITAL_COMMUNITY): Payer: Self-pay

## 2018-03-23 ENCOUNTER — Inpatient Hospital Stay (HOSPITAL_COMMUNITY): Payer: Self-pay | Admitting: *Deleted

## 2018-03-23 ENCOUNTER — Inpatient Hospital Stay (HOSPITAL_COMMUNITY): Payer: Self-pay | Admitting: Occupational Therapy

## 2018-03-23 LAB — GLUCOSE, CAPILLARY: GLUCOSE-CAPILLARY: 78 mg/dL (ref 70–99)

## 2018-03-23 MED ORDER — LEVETIRACETAM 100 MG/ML PO SOLN
500.0000 mg | Freq: Two times a day (BID) | ORAL | Status: DC
  Administered 2018-03-23 – 2018-04-09 (×35): 500 mg via ORAL
  Filled 2018-03-23 (×36): qty 5

## 2018-03-23 NOTE — Progress Notes (Signed)
Physical Therapy Session Note  Patient Details  Name: Maria Avila MRN: [id number] Date of Birth: 1951-01-09  Today's Date: 03/23/2018 PT Individual Time: 1032-1101 and 2992-4268 PT Individual Time Calculation (min): 29 min and 44 min  Short Term Goals: Week 1:  PT Short Term Goal 1 (Week 1): pt will move supine>< sit with min assist PT Short Term Goal 2 (Week 1): pt will transfer bed>< w/c with assistance of 1 person PT Short Term Goal 3 (Week 1): pt will propel w/c x 50' with min assist PT Short Term Goal 4 (Week 1): pt will initiate gait with assistance of 2 persons  Skilled Therapeutic Interventions/Progress Updates:  Treatment 1: Pt received in bed with husband present for session. No c/o pain reported. Pt performed rolling L<>R with max cuing for LE/UE placement and min assist for rolling to allow therapist to don pants and socks total assist. Pt transferred from R sidelying>sitting EOB with max assist as pt with difficulty motor planning and ability to follow instructions to move LE to EOB. Pt able to maintain static sitting balance with close supervision. Pt completes squat pivot transfer bed>w/c on L with max assist 2/2 pt's decreased ability to initiate movement and motor plan. Pt requires max assist for anterior weight shifting and total assist to scoot back in w/c seat. Provided pt with new cushion (partial roho cushion) to prevent further skin breakdown while sitting in w/c. Transported pt to gym via w/c dependent assist. Pt transferred sit>stand with +2 max assist and pt ambulates 5 ft with 3 muskateers + w/c follow with pt requiring max cuing for sequencing steps, assistance for advancing LLE, blocking at BLE to prevent buckling during stance phase with pt demonstrating significant external rotation LLE (pt's husband reports this is not pre-existing), and LLE ataxia. Back in room therapist assisted pt with donning clean shirt total assist for time management. Pt left sitting in w/c in  room with chair alarm donned, husband present to supervise.   Treatment 2: Pt received in bed & agreeable to tx. No c/o or behaviors demonstrating pain during session. Pt with tremors throughout body and frequently partially closing eyes - RN & PA made aware & assessed pt and clears pt for activity. Pt rolls to R with min assist with cuing for LE/UE placement. Pt transfers sidelying>sitting EOB with max assist and completes squat pivot transfers with +2 assist for safety with therapist placing hands on armrest/bed rails. Transported pt to gym and onto mat table. Pt transferred sit>stand with +2 assist x 2 trials with BLE externally rotating with therapists blocking BLE and encouraging weight bearing through BLE. Pt's BP sitting = 104/58 mmHg (RUE), HR = 94 bpm, standing BP = 103/80 mmHg (RUE), HR = 119 bpm. Pt engaged in dynamic sitting task EOM with either no UE or LUE support and close supervision while tossing/catching ball then kicking ball to focus on functional task for strengthening and BLE coordination & NMR. Pt returned to w/c and utilized dynavision from w/c level with mod cuing for sustained attention to task & pt electing to use LUE only to reach & press lights; pt appears to have impaired depth perception when pressing lights to R of midline. At end of session pt returned to bed via squat pivot max assist +1 and sit>supine with max assist. Pt left in bed with alarm set & husband present to supervise.  Therapy Documentation Precautions:  Precautions Precautions: Fall Precaution Comments: apraxic, somewhat impulsive Restrictions Weight Bearing Restrictions: No  Therapy/Group: Individual Therapy  [name] 03/23/2018, 4:19 PM

## 2018-03-23 NOTE — Progress Notes (Signed)
Speech Language Pathology Daily Session Note  Patient Details  Name: Maria Avila MRN: [id number] Date of Birth: [birth year]  Today's Date: 03/23/2018 SLP Individual Time: 0802-0900 SLP Individual Time Calculation (min): 58 min  Short Term Goals: Week 1: SLP Short Term Goal 1 (Week 1): Pt will utilize compensatory memory aids to recall orientation information with Min A cues.  SLP Short Term Goal 2 (Week 1): Pt will sustain attention to basic task for ~ 5 minutes with Mod A cues.  SLP Short Term Goal 3 (Week 1): Pt will complete basic problem solving tasks with Min A cues.  SLP Short Term Goal 4 (Week 1): Pt will demonstrate intellectual awareness by listing 2 physical and 2 cognitive deficits related to acute illness with Mod A cues.  SLP Short Term Goal 5 (Week 1): Pt will consume current diet without overt s/s of dysphagia/aspiration with supervision cues for use of compensatory swallow strategies.  SLP Short Term Goal 6 (Week 1): Pt will consume trials of regular diet textures with minimal s/s of dysphagia/aspiration with Min A cues for use of compensatory swallow strategies.   Skilled Therapeutic Interventions:Skilled ST services focused on swallow and cognitive skills. SLP facilitated PO consumption of breakfast tray dys 3 and thin, however items presented as dys 2, pt required min A verbal cues for problem solving during self feeding and supervision A verbal cues for swallow strategies. Pt demonstrated improved cogntive state compared to yesterdays session. SLP facilitated orientation of time, place and situation, pt required mod A verbal cues fade to min A verbal cues. Pt demonstrated recall of yesterdays events with mod-min a verbal cues. SLP facilitated basic problem solving counting change pt required max A verbal cues, further impacted by reduced sustained attention and recall deficits. SLP facilitated basic problem solving utilizing simple 3 step sequence cards pt required max A verbal  cues for verbal sequencing and total A for functional sequencing given return demonstration. Pt demonstrated ability to direct care with mod A verbal cues. Pt was left in room with call bell within reach and bed alarm set. SLP reccomends to continue skilled services.      Pain Pain Assessment Pain Score: 0-No pain  Therapy/Group: Individual Therapy  Ciella Obi  [facility] 03/23/2018, 2:07 PM

## 2018-03-23 NOTE — Progress Notes (Signed)
Physical Therapy Session Note  Patient Details  Name: Maria Avila MRN: [id number] Date of Birth: 19-Mar-1951  Today's Date: 03/23/2018 PT Individual Time: 0623-7628 PT Individual Time Calculation (min): 39 min   Short Term Goals: Week 1:  PT Short Term Goal 1 (Week 1): pt will move supine>< sit with min assist PT Short Term Goal 2 (Week 1): pt will transfer bed>< w/c with assistance of 1 person PT Short Term Goal 3 (Week 1): pt will propel w/c x 50' with min assist PT Short Term Goal 4 (Week 1): pt will initiate gait with assistance of 2 persons  Skilled Therapeutic Interventions/Progress Updates:    pt awake upon PT arrival, initiates bed mobility and transfers this session.  Pt able to perform supine to sit with mod A, stand pivot transfers throughout session with max A for wt shifts, motor planning and LE positioning.  nustep x 4 minutes with mod/max cues for attention to task.  Pt provided with tilt in space w/c with Roho cushion per PA recommendation. Pt left in tilt in space w/c with alarm on, needs at hand, husband present.  Therapy Documentation Precautions:  Precautions Precautions: Fall Precaution Comments: apraxic, somewhat impulsive Restrictions Weight Bearing Restrictions: No Pain: Pain Assessment Pain Score: 0-No pain    Therapy/Group: Individual Therapy  Maria Avila 03/23/2018, 4:24 PM

## 2018-03-23 NOTE — Progress Notes (Signed)
[place] PHYSICAL MEDICINE & REHABILITATION PROGRESS NOTE   Subjective/Complaints:   No further seizure activity noted, appreciate Neuro input ROS: Unreliable due to cognition  Objective:   Ct Head Wo Contrast  Result Date: 03/22/2018 CLINICAL DATA:  67 year old female with altered mental status. Short generalized seizure. Question hemorrhagic conversion of infarcts. Subsequent encounter. EXAM: CT HEAD WITHOUT CONTRAST TECHNIQUE: Contiguous axial images were obtained from the base of the skull through the vertex without intravenous contrast. COMPARISON:  03/20/2018 head CT and 03/10/2018 brain MR. FINDINGS: Brain: Left parietal lobe subacute infarct with slightly dense gyri which may represent laminar necrosis appear similar to the recent head CT. No intracranial hemorrhage. Remote MR detected subacute right paracentral pontine infarct and posterior right temporal lobe infarct not well delineated by CT. Chronic microvascular changes. No intracranial mass lesion noted on this unenhanced exam. Global atrophy. Vascular: Vascular calcifications Skull: No acute abnormality.  Hyperostosis frontalis interna. Sinuses/Orbits: No acute orbital abnormality. Visualized paranasal sinuses are clear. Other: Mastoid air cells and middle ear cavities are clear. IMPRESSION: 1. Left parietal lobe subacute infarct with slightly dense gyri, which may represent laminar necrosis, appears similar to the recent head CT. 2. No intracranial hemorrhage. 3. Remote MR detected subacute right paracentral pontine infarct and posterior right temporal lobe infarct not well delineated by CT. 4. Chronic microvascular changes. 5. Global atrophy. Electronically Signed   By: Genia Del M.D.   On: 03/22/2018 10:55   Recent Labs    03/20/18 1826  WBC 8.6  HGB 9.0*  HCT 27.5*  PLT 308   Recent Labs    03/20/18 1826  NA 139  K 3.5  CL 112*  CO2 23  GLUCOSE 88  BUN 10  CREATININE 0.71  CALCIUM 8.2*    Intake/Output  Summary (Last 24 hours) at 03/23/2018 0710 Last data filed at 03/23/2018 0525 Gross per 24 hour  Intake 310 ml  Output 1325 ml  Net -1015 ml     Physical Exam: Vital Signs Blood pressure 102/89, pulse 85, temperature 98.5 F (36.9 C), temperature source Oral, resp. rate 18, weight 71.2 kg, SpO2 100 %. Constitutional: No distress . Vital signs reviewed. HENT: Normocephalic.  Atraumatic. Eyes: EOMI. No discharge. Cardiovascular: RRR.  No JVD. Respiratory: crackles at Left base no resp distress, no wheezes GI: BS +. Non-distended. Musculoskeletal: She exhibits bilateral lower extremity edema Neurological: She is alert and oriented x1.  Delayed processing.  Motor: 4/5 bue and ble  Skin: Sacral wound dressed. Left CT site sutures removed  Psychiatric: Restless.  Confused.  Assessment/Plan: 1. 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  Physiatrist is providing close team supervision and 24 hour management of active medical problems listed below.  Physiatrist and rehab team continue to assess barriers to discharge/monitor patient progress toward functional and medical goals  Care Tool:  Bathing              Bathing assist Assist Level: 2 Helpers(per reports)     Upper Body Dressing/Undressing Upper body dressing   What is the patient wearing?: Pull over shirt    Upper body assist Assist Level: Moderate Assistance - Patient 50 - 74%    Lower Body Dressing/Undressing Lower body dressing      What is the patient wearing?: Pants, Incontinence brief     Lower body assist Assist for lower body dressing: Total Assistance - Patient < 25%  Toileting Toileting    Toileting assist Assist for toileting: Total Assistance - Patient < 25%     Transfers Chair/bed transfer  Transfers assist     Chair/bed transfer assist level: 2 Helpers      Locomotion Ambulation   Ambulation assist   Ambulation activity did not occur: Safety/medical concerns(apraxia)  Assist level: (error)       Walk 10 feet activity   Assist           Walk 50 feet activity   Assist           Walk 150 feet activity   Assist           Walk 10 feet on uneven surface  activity   Assist           Wheelchair     Assist Will patient use wheelchair at discharge?: Yes Type of Wheelchair: Manual    Wheelchair assist level: Maximal Assistance - Patient 25 - 49% Max wheelchair distance: 15    Wheelchair 50 feet with 2 turns activity    Assist    Wheelchair 50 feet with 2 turns activity did not occur: Safety/medical concerns(fatigue)   Assist Level: Minimal Assistance - Patient > 75%   Wheelchair 150 feet activity     Assist Wheelchair 150 feet activity did not occur: Safety/medical concerns(fatigue)         Medical Problem List and Plan: 1.  Limitations in mobility, self-care, endurance secondary to L>R infarcts and multiple medical issues.  -Continue CIR  Repeat head CT no hemorrhagic conversion  AMS with witnessed short seizure, EEG shows generalized slowing.  Also starting po Keppra AMS hx  2.  BLE DVTs/Anticoagulation: Pharmaceutical: Other (comment)-on Eliquis 3. Pain Management: tylenol prn.  4. Mood: LCSW to follow for evaluation and support.  5. Neuropsych: This patient is not capable of making decisions on her own behalf. 6. Skin/Wound Care: Air mattress due to sacral breakdown.   -nutrition/local skin care to chest/sacrum 7. Fluids/Electrolytes/Nutrition:  Continue dysphagia 3 thin diet. - may d/c CBG Off TNA.  BMP within acceptable range on 11/16 8. Empyema: Continue Levaquin for 3 weeks--end date 11/21.   -local care to CT site, Needs IS for atelectasis 9. Malnutrition/Heavy alcohol use: Poor intake with 40 lbs weight loss.   -B12 and folate levels normal 10. Diarrhea: Added  probiotic and fiber. Discontinued Ensure and added prostat.    -rectal tube out  -stool still loose but becoming more formed. Likely abx effect  -no indications that it's infectious although WBC sl elevated (11.1 on 11/15)  -added imodium 11. Hypokalemia: Likely due to nutritional deficits as well as diarrhea.   -Potassium supplement  Repeat labs ordered  Potassium 3.5 on 11/16  Continue to monitor 12. Leucocytosis with neutropenia: Resolved  Continue to monitor.  13.  Acute blood loss anemia  Hb 9.0 on 11/16  Cont to monitor   14.  Probable post CVA seizure,Keppra 15.  Urinary retention, ICP LOS: 6 days A FACE TO FACE EVALUATION WAS PERFORMED  Charlett Blake 03/23/2018, 7:10 AM

## 2018-03-23 NOTE — Progress Notes (Signed)
Occupational Therapy Session Note  Patient Details  Name: Maria Avila MRN: [id number] Date of Birth: 1950-09-16  Today's Date: 03/23/2018 OT Individual Time: 7354-3014 OT Individual Time Calculation (min): 40 min    Short Term Goals: Week 1:  OT Short Term Goal 1 (Week 1): Pt will complete bed mobility with max assist of one caregiver to decrease burden of care with bathing and dressing OT Short Term Goal 2 (Week 1): Pt will complete bathing with max assist  OT Short Term Goal 3 (Week 1): Pt will complete bed > w/c transfer to prepare for toilet transfers with max assist of one caregiver OT Short Term Goal 4 (Week 1): Pt will don pants with max assist  Skilled Therapeutic Interventions/Progress Updates:    Pt presents supine in bed, no signs or symptoms of pain during session, pt agreeable to therapy session. Pt with fluctuating levels of lethargy during session and nodding off easily throughout, though fairly easy to arouse to continue participation in session. Pt positioned in bed in chair position, participating in card matching activity. Pt requiring max cues but is able to successfully match 5/9 cards during task given increased time. Additional focus on UB endurance and facilitating increased use of RUE with use of light weight ball, pt rasing/lowering to reach for therapists hands given increased time and occasional AAROM to assist RUE. Pt also able to perform small ball toss x5 times to throw ball towards therapist, mostly use of LUE during task. Pt engaged in additional AAROM to RUE, bil LE exercise x10 reps throughout. Pt left supine in bed end of session with telesitter present, call bell in reach and needs met.   Therapy Documentation Precautions:  Precautions Precautions: Fall Precaution Comments: apraxic, somewhat impulsive Restrictions Weight Bearing Restrictions: No Pain: Pain Assessment Pain Score: 0-No pain (faces)   Therapy/Group: Individual Therapy  Raymondo Band 03/23/2018, 3:15 PM

## 2018-03-23 NOTE — Progress Notes (Signed)
Physical Therapy Wound Treatment Patient Details  Name: Maria Avila MRN: [id number] Date of Birth: 02/18/1951  Today's Date: 03/23/2018 PT Individual Time: 1245-8099 PT Individual Time Calculation (min): 35 min   Patient Active Problem List   Diagnosis Date Noted  . Acute blood loss anemia   . Impulsive   . Stroke due to embolism ([place]) 03/17/2018  . Hypokalemia   . ETOH abuse   . Dysphagia, post-stroke   . Benign essential HTN   . Acute deep vein thrombosis (DVT) of distal vein of both lower extremities (HCC)   . Sinus tachycardia   . Chest tube in place   . Esophageal perforation   . Perforation esophagus   . Sepsis with acute renal failure ([place])   . Acute deep vein thrombosis (DVT) of both peroneal veins   . Leukocytosis   . Sepsis due to Pseudomonas species ([place])   . Cerebral embolism with cerebral infarction 03/09/2018  . Malnutrition of moderate degree 03/08/2018  . Empyema ([place]) 03/04/2018  . Pressure injury of skin 03/04/2018  . Acute respiratory failure with hypercapnia ([place]) 03/04/2018  . Diarrhea 02/26/2018  . Pancytopenia ([place]) 02/19/2018  . Hypotension 02/19/2018  . Severe protein-calorie malnutrition ([place]) 02/17/2018  . Colitis 02/16/2018  . Weakness 02/16/2018  . Nausea vomiting and diarrhea 02/16/2018  . Diabetes ([place]) 02/16/2018  . Essential hypertension 02/16/2018  . Anemia 02/16/2018   Past Medical History:  Diagnosis Date  . Colitis   . Diabetes mellitus without complication ([place])   . Hypertension   . Malnutrition ([place])    Past Surgical History:  Procedure Laterality Date  . BIOPSY  02/26/2018   Procedure: BIOPSY;  Surgeon: Wilford Corner, MD;  Location: [facility];  Service: Endoscopy;;  . COLONOSCOPY WITH PROPOFOL N/A 02/26/2018   Procedure: COLONOSCOPY WITH PROPOFOL;  Surgeon: Wilford Corner, MD;  Location: [facility];  Service: Endoscopy;  Laterality: N/A;  . ESOPHAGOGASTRODUODENOSCOPY (EGD) WITH PROPOFOL N/A 02/26/2018   Procedure: ESOPHAGOGASTRODUODENOSCOPY (EGD) WITH PROPOFOL;  Surgeon: Wilford Corner, MD;  Location: [facility];  Service: Endoscopy;  Laterality: N/A;  . NO PAST SURGERIES     UNCERTAIN OF NAME OF SURGERY    Subjective  Subjective: Pt denied any pain Patient and Family Stated Goals: Pt not stated Prior Treatments: Dressing changes  Pain Score:    Objective  Cognition: Impaired:  Communication: Impaired:  Mobility: Impaired:  ROM: Impaired:  Sensation: diminished  Protective sensation (10g): not tested Signs of venous insufficiency:N/A Signs of arterial insufficiency: N/A  Wound Assessment  Pressure Injury 03/22/18 Unstageable - Full thickness tissue loss in which the base of the ulcer is covered by slough (yellow, tan, gray, green or brown) and/or eschar (tan, brown or black) in the wound bed. Patient had MASD POA, patient continues to be  (Active)  Wound Image   03/23/2018  9:12 AM  Dressing Type Gauze (Comment);Foam;Moist to [employer] (skin prep) 03/23/2018  9:12 AM  Dressing Changed;Clean;Dry;Intact 03/23/2018  9:12 AM  Dressing Change Frequency Daily 03/23/2018  9:12 AM  State of Healing Eschar 03/23/2018  9:12 AM  Site / Wound Assessment Pink;Red;Yellow 03/23/2018  9:12 AM  % Wound base Red or Granulating 5% 03/23/2018  9:12 AM  % Wound base Yellow/Fibrinous Exudate 95% 03/23/2018  9:12 AM  % Wound base Black/Eschar 0% 03/23/2018  9:12 AM  % Wound base Other/Granulation Tissue (Comment) 0% 03/23/2018  9:12 AM  Peri-wound Assessment Excoriated;Maceration 03/23/2018  9:12 AM  Wound Length (cm) 4.5 cm 03/23/2018  9:12 AM  Wound Width (cm) 9 cm 03/23/2018  9:12 AM  Wound Depth (cm) 0.1 cm 03/23/2018  9:12 AM  Wound Surface Area (cm^2) 40.5 cm^2 03/23/2018  9:12 AM  Wound Volume (cm^3) 4.05 cm^3 03/23/2018  9:12 AM  Margins Unattached edges (unapproximated) 03/23/2018  9:12 AM  Drainage Amount Minimal 03/23/2018  9:12 AM  Drainage Description Serous 03/23/2018   9:12 AM  Treatment Debridement (Selective);Hydrotherapy (Pulse lavage);Packing (Saline gauze) 03/23/2018  9:12 AM   Santyl applied to wound bed prior to applying dressing.    Hydrotherapy Pulsed lavage therapy - wound location: sacrum Pulsed Lavage with Suction (psi): 8 psi(8-12) Pulsed Lavage with Suction - Normal Saline Used: 1000 mL Pulsed Lavage Tip: Tip with splash shield Selective Debridement Selective Debridement - Location: sacrum Selective Debridement - Tools Used: Forceps;Scalpel Selective Debridement - Tissue Removed: yellow necrotic tissue   Wound Assessment and Plan  Wound Therapy - Assess/Plan/Recommendations Wound Therapy - Clinical Statement: Pt presents to hydrotherapy with unstageable sacral wound. Pt can benefit from hydrotherapy to assist with removal of necrotic tissue and to reduce bioburden Wound Therapy - Functional Problem List: decr mobility and sitting tolerance Factors Delaying/Impairing Wound Healing: Incontinence;Immobility;Multiple medical problems;Polypharmacy;Diabetes Mellitus Hydrotherapy Plan: Debridement;Dressing change;Patient/family education;Pulsatile lavage with suction Wound Therapy - Frequency: 6X / week Wound Therapy - Follow Up Recommendations: Home health RN Wound Plan: See above  Wound Therapy Goals- Improve the function of patient's integumentary system by progressing the wound(s) through the phases of wound healing (inflammation - proliferation - remodeling) by: Decrease Necrotic Tissue to: 80 Decrease Necrotic Tissue - Progress: Goal set today Increase Granulation Tissue to: 20 Increase Granulation Tissue - Progress: Goal set today  Goals will be updated until maximal potential achieved or discharge criteria met.  Discharge criteria: when goals achieved, discharge from hospital, MD decision/surgical intervention, no progress towards goals, refusal/missing three consecutive treatments without notification or medical reason.  Shary Decamp  [facility] 03/23/2018, 10:06 AM

## 2018-03-23 NOTE — Progress Notes (Signed)
Recreational Therapy Session Note  Patient Details  Name: Maria Avila MRN: [id number] Date of Birth: 07/23/1950 Today's Date: 03/23/2018  Order received and chart reviewed.  Met with pt and husband briefly today & pt full TR eval deferred as she is not yet appropriate for TR services.  Will continue to monitor through team for future participation.  Jenica Costilow 03/23/2018, 3:29 PM

## 2018-03-23 NOTE — Plan of Care (Signed)
  Problem: Consults Goal: [facility] STROKE PATIENT EDUCATION Description See Patient Education module for education specifics  03/23/2018 1027 by Cataldo Cosgriff, Renato Gails, RN Outcome: Not Progressing 03/23/2018 1027 by Hillery Jacks, RN Outcome: Not Met (add Reason) Goal: Nutrition Consult-if indicated 03/23/2018 1027 by Zaheer Wageman, Renato Gails, RN Outcome: Not Progressing 03/23/2018 1027 by Hillery Jacks, RN Outcome: Not Met (add Reason)   Problem: RH BOWEL ELIMINATION Goal: RH STG MANAGE BOWEL WITH ASSISTANCE Description STG Manage Bowel with mod Assistance.  03/23/2018 1027 by Hillery Jacks, RN Outcome: Not Progressing 03/23/2018 1027 by Hillery Jacks, RN Outcome: Not Met (add Reason)   Problem: RH BLADDER ELIMINATION Goal: RH STG MANAGE BLADDER WITH ASSISTANCE Description STG Manage Bladder With mod Assistance  03/23/2018 1027 by Hillery Jacks, RN Outcome: Not Progressing 03/23/2018 1027 by Hillery Jacks, RN Outcome: Not Met (add Reason)   Problem: RH SKIN INTEGRITY Goal: RH STG SKIN FREE OF INFECTION/BREAKDOWN Description Patients skin will remain free from further infection or breakdown during rehab stay with mod assist.  03/23/2018 1027 by Hillery Jacks, RN Outcome: Not Progressing 03/23/2018 1027 by Hillery Jacks, RN Outcome: Not Met (add Reason) Goal: RH STG MAINTAIN SKIN INTEGRITY WITH ASSISTANCE Description STG Maintain Skin Integrity With mod Assistance.  03/23/2018 1027 by Hillery Jacks, RN Outcome: Not Progressing 03/23/2018 1027 by Hillery Jacks, RN Outcome: Not Met (add Reason) Goal: RH STG ABLE TO PERFORM INCISION/WOUND CARE W/ASSISTANCE Description STG Able To Perform Incision/Wound Care With total Assistance from caregiver.  03/23/2018 1027 by Hillery Jacks, RN Outcome: Not Progressing 03/23/2018 1027 by Hillery Jacks, RN Outcome: Not Met (add Reason)   Problem: RH SAFETY Goal: RH STG ADHERE TO SAFETY PRECAUTIONS  W/ASSISTANCE/DEVICE Description STG Adhere to Safety Precautions With min Assistance/Device.  03/23/2018 1027 by Hillery Jacks, RN Outcome: Not Progressing 03/23/2018 1027 by Hillery Jacks, RN Outcome: Not Met (add Reason)   Problem: RH KNOWLEDGE DEFICIT Goal: RH STG INCREASE KNOWLEDGE OF HYPERTENSION Description Min assist.  03/23/2018 1027 by Hillery Jacks, RN Outcome: Not Progressing 03/23/2018 1027 by Hillery Jacks, RN Outcome: Not Met (add Reason) Goal: RH STG INCREASE KNOWLEDGE OF DYSPHAGIA/FLUID INTAKE Description Min assist  03/23/2018 1027 by Hillery Jacks, RN Outcome: Not Progressing 03/23/2018 1027 by Hillery Jacks, RN Outcome: Not Met (add Reason) Goal: RH STG INCREASE KNOWLEGDE OF HYPERLIPIDEMIA Description Min assist  03/23/2018 1027 by Hillery Jacks, RN Outcome: Not Progressing 03/23/2018 1027 by Hillery Jacks, RN Outcome: Not Met (add Reason) Goal: RH STG INCREASE KNOWLEDGE OF STROKE PROPHYLAXIS Description Min assist  03/23/2018 1027 by Hillery Jacks, RN Outcome: Not Progressing 03/23/2018 1027 by Hillery Jacks, RN Outcome: Not Met (add Reason)

## 2018-03-24 ENCOUNTER — Inpatient Hospital Stay (HOSPITAL_COMMUNITY): Payer: Self-pay | Admitting: Occupational Therapy

## 2018-03-24 ENCOUNTER — Inpatient Hospital Stay (HOSPITAL_COMMUNITY): Payer: Self-pay

## 2018-03-24 ENCOUNTER — Inpatient Hospital Stay (HOSPITAL_COMMUNITY): Payer: Self-pay | Admitting: Physical Therapy

## 2018-03-24 DIAGNOSIS — I69319 Unspecified symptoms and signs involving cognitive functions following cerebral infarction: Secondary | ICD-10-CM

## 2018-03-24 DIAGNOSIS — I6310 Cerebral infarction due to embolism of unspecified precerebral artery: Secondary | ICD-10-CM

## 2018-03-24 MED ORDER — QUETIAPINE FUMARATE 50 MG PO TABS
50.0000 mg | ORAL_TABLET | Freq: Every day | ORAL | Status: DC
  Administered 2018-03-24 – 2018-04-01 (×9): 50 mg via ORAL
  Filled 2018-03-24 (×9): qty 1

## 2018-03-24 MED ORDER — LOPERAMIDE HCL 2 MG PO CAPS
4.0000 mg | ORAL_CAPSULE | Freq: Two times a day (BID) | ORAL | Status: DC
  Administered 2018-03-24 – 2018-04-09 (×31): 4 mg via ORAL
  Filled 2018-03-24 (×32): qty 2

## 2018-03-24 MED ORDER — TAMSULOSIN HCL 0.4 MG PO CAPS
0.4000 mg | ORAL_CAPSULE | Freq: Every day | ORAL | Status: DC
  Administered 2018-03-24 – 2018-04-08 (×15): 0.4 mg via ORAL
  Filled 2018-03-24 (×16): qty 1

## 2018-03-24 NOTE — Progress Notes (Signed)
Social Work Patient ID: Maria Avila, female   DOB: [birth year], 67 y.o.   MRN: [id number] Met with pt and husband to discuss team conference goals min-mod level of assist and target discharge 12/6. Husband voiced he needs her to be able to stand and pivot so he will be able to transfer her. Aware MD is working on sleep and medications to be less lethargic. She voiced her wound is very painful and she is not able to sit up much due to the pain it causes. Husband is here daily and will try his best. Will continue to work on discharge plans.

## 2018-03-24 NOTE — Progress Notes (Signed)
[facility] PHYSICAL MEDICINE & REHABILITATION PROGRESS NOTE   Subjective/Complaints:   No further seizure activity noted, patient with poor p.o. intake, remains lethargic at times.  Appreciate wound care services ROS: Unreliable due to cognition  Objective:   Ct Head Wo Contrast  Result Date: 03/22/2018 CLINICAL DATA:  67 year old female with altered mental status. Short generalized seizure. Question hemorrhagic conversion of infarcts. Subsequent encounter. EXAM: CT HEAD WITHOUT CONTRAST TECHNIQUE: Contiguous axial images were obtained from the base of the skull through the vertex without intravenous contrast. COMPARISON:  03/20/2018 head CT and 03/10/2018 brain MR. FINDINGS: Brain: Left parietal lobe subacute infarct with slightly dense gyri which may represent laminar necrosis appear similar to the recent head CT. No intracranial hemorrhage. Remote MR detected subacute right paracentral pontine infarct and posterior right temporal lobe infarct not well delineated by CT. Chronic microvascular changes. No intracranial mass lesion noted on this unenhanced exam. Global atrophy. Vascular: Vascular calcifications Skull: No acute abnormality.  Hyperostosis frontalis interna. Sinuses/Orbits: No acute orbital abnormality. Visualized paranasal sinuses are clear. Other: Mastoid air cells and middle ear cavities are clear. IMPRESSION: 1. Left parietal lobe subacute infarct with slightly dense gyri, which may represent laminar necrosis, appears similar to the recent head CT. 2. No intracranial hemorrhage. 3. Remote MR detected subacute right paracentral pontine infarct and posterior right temporal lobe infarct not well delineated by CT. 4. Chronic microvascular changes. 5. Global atrophy. Electronically Signed   By: Genia Del M.D.   On: 03/22/2018 10:55   No results for input(s): WBC, HGB, HCT, PLT in the last 72 hours. No results for input(s): NA, K, CL, CO2, GLUCOSE, BUN, CREATININE, CALCIUM in the last  72 hours.  Intake/Output Summary (Last 24 hours) at 03/24/2018 0931 Last data filed at 03/24/2018 0900 Gross per 24 hour  Intake 671.2 ml  Output 1350 ml  Net -678.8 ml     Physical Exam: Vital Signs Blood pressure 120/83, pulse 92, temperature 98 F (36.7 C), resp. rate 18, weight 71.2 kg, SpO2 100 %. Constitutional: No distress . Vital signs reviewed. HENT: Normocephalic.  Atraumatic. Eyes: EOMI. No discharge. Cardiovascular: RRR.  No JVD. Respiratory: crackles at Left base no resp distress, no wheezes GI: BS +. Non-distended. Musculoskeletal: She exhibits bilateral lower extremity edema Neurological: She is alert and oriented x1.  Delayed processing.  Motor: 4/5 bue and ble  Skin: Sacral wound dressed. Left CT site sutures removed  Psychiatric: Restless.  Confused.  Assessment/Plan: 1. 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  Physiatrist is providing close team supervision and 24 hour management of active medical problems listed below.  Physiatrist and rehab team continue to assess barriers to discharge/monitor patient progress toward functional and medical goals  Care Tool:  Bathing              Bathing assist Assist Level: 2 Helpers(per reports)     Upper Body Dressing/Undressing Upper body dressing   What is the patient wearing?: Pull over shirt    Upper body assist Assist Level: Moderate Assistance - Patient 50 - 74%    Lower Body Dressing/Undressing Lower body dressing      What is the patient wearing?: Pants, Incontinence brief     Lower body assist Assist for lower body dressing: Total Assistance - Patient < 25%     Toileting Toileting    Toileting assist Assist for toileting: 2 Helpers  Transfers Chair/bed transfer  Transfers assist     Chair/bed transfer assist level: Maximal Assistance - Patient 25 - 49%      Locomotion Ambulation   Ambulation assist   Ambulation activity did not occur: Safety/medical concerns(apraxia)  Assist level: (3 muskateers + w/c follow)   Max distance: 5 ft    Walk 10 feet activity   Assist           Walk 50 feet activity   Assist           Walk 150 feet activity   Assist           Walk 10 feet on uneven surface  activity   Assist           Wheelchair     Assist Will patient use wheelchair at discharge?: Yes Type of Wheelchair: Manual    Wheelchair assist level: Maximal Assistance - Patient 25 - 49% Max wheelchair distance: 15    Wheelchair 50 feet with 2 turns activity    Assist    Wheelchair 50 feet with 2 turns activity did not occur: Safety/medical concerns(fatigue)   Assist Level: Minimal Assistance - Patient > 75%   Wheelchair 150 feet activity     Assist Wheelchair 150 feet activity did not occur: Safety/medical concerns(fatigue)         Medical Problem List and Plan: 1.  Limitations in mobility, self-care, endurance secondary to L>R infarcts and multiple medical issues.  -Continue CIR  Repeat head CT no hemorrhagic conversion  AMS with witnessed short seizure, EEG shows generalized slowing.  Also starting po Keppra monitoring response to new medication this point it does not appear that this is causing additional sedation Team conference today please see physician documentation under team conference tab, met with team face-to-face to discuss problems,progress, and goals. Formulized individual treatment plan based on medical history, underlying problem and comorbidities.  2.  BLE DVTs/Anticoagulation: Pharmaceutical: Other (comment)-on Eliquis 3. Pain Management: tylenol prn.  4. Mood: LCSW to follow for evaluation and support.  5. Neuropsych: This patient is not capable of making decisions on her own behalf. 6. Skin/Wound Care: Air mattress due to sacral breakdown.   -nutrition/local skin  care to chest/sacrum 7. Fluids/Electrolytes/Nutrition:  Continue dysphagia 3 thin diet. - may d/c CBG Off TNA.  BMP within acceptable range on 11/16 8. Empyema: Continue Levaquin for 3 weeks--end date 11/21.   -local care to CT site, Needs IS for atelectasis 9. Malnutrition/Heavy alcohol use: Poor intake with 40 lbs weight loss.   -B12 and folate levels normal 10. Diarrhea: Added probiotic and fiber. Discontinued Ensure and added prostat.    -rectal tube out  -stool still loose but becoming more formed. Likely abx effect  -no indications that it's infectious although WBC sl elevated (11.1 on 11/15)  -added imodium 11. Hypokalemia: Likely due to nutritional deficits as well as diarrhea.   -Potassium supplement  Repeat labs ordered  Potassium 3.5 on 11/16  Recheck on 1122 12. Leucocytosis with neutropenia: Resolved  Continue to monitor.  13.  Acute blood loss anemia  Hb 9.0 on 11/16 we will recheck 1122  Cont to monitor   14.  Probable post CVA seizure,Keppra, if patient becomes more lethargic we can reduce the dose at 250 mg twice a day 15.  Urinary retention, ICP, add Flomax LOS: 7 days A FACE TO FACE EVALUATION WAS PERFORMED  Charlett Blake 03/24/2018, 9:31 AM

## 2018-03-24 NOTE — Progress Notes (Signed)
Occupational Therapy Session Note  Patient Details  Name: Maria Avila MRN: [id number] Date of Birth: Dec 08, 1950  Today's Date: 03/24/2018 OT Individual Time: 1000-1030 OT Individual Time Calculation (min): 30 min    Short Term Goals: Week 1:  OT Short Term Goal 1 (Week 1): Pt will complete bed mobility with max assist of one caregiver to decrease burden of care with bathing and dressing OT Short Term Goal 2 (Week 1): Pt will complete bathing with max assist  OT Short Term Goal 3 (Week 1): Pt will complete bed > w/c transfer to prepare for toilet transfers with max assist of one caregiver OT Short Term Goal 4 (Week 1): Pt will don pants with max assist  Skilled Therapeutic Interventions/Progress Updates:    Patient supine in bed upon arrival, alert and pleasant,  Completed lower body bathing and dressing bed level, patient able to reach to both feet and assist with washing - overall max A,   She had loose stool t/o session requiring assistance to clean up x2 and change brief - bottom is sore abd bleeding (nursing notified)   Patient mod/max A for LB dressing in supine position, rolling side to side with min A.    SSP to/from supine with mod A.   Able to sit edge of bed with min A for Upper body dressing with mod A. Returned to supine position in bed, alarm set   Therapy Documentation Precautions:  Precautions Precautions: Fall Precaution Comments: apraxic, somewhat impulsive Restrictions Weight Bearing Restrictions: No General:   Vital Signs:   Pain: Pain Assessment Pain Scale: 0-10 Pain Score: 2  Pain Location: Buttocks Pain Intervention(s): RN made aware;Repositioned   Therapy/Group: Individual Therapy  Carlos Levering 03/24/2018, 12:23 PM

## 2018-03-24 NOTE — Progress Notes (Signed)
Physical Therapy Session Note  Patient Details  Name: Maria Avila MRN: [id number] Date of Birth: 1950/08/25  Today's Date: 03/24/2018 PT Individual Time: 9735-3299 PT Individual Time Calculation (min): 53 min   Short Term Goals: Week 1:  PT Short Term Goal 1 (Week 1): pt will move supine>< sit with min assist PT Short Term Goal 2 (Week 1): pt will transfer bed>< w/c with assistance of 1 person PT Short Term Goal 3 (Week 1): pt will propel w/c x 50' with min assist PT Short Term Goal 4 (Week 1): pt will initiate gait with assistance of 2 persons  Skilled Therapeutic Interventions/Progress Updates:  Pt received in care of NT, and assisted to w/c. Once in w/c therapist educated pt's husband [name]) on need for pressure relief when sitting in w/c; educated him to reposition TIS w/c every 30 minutes with [name] reported & demonstrated understanding of operating w/c, but declining boosting schedule & timer therapist offered him. Transported pt to gym via w/c dependent assist for time management. Pt completes squat pivot w/c>mat table on R with max assist but improved ability to participate in movement. Pt transferred to standing in stedy and reached for cups outside of BOS with pt preferring to use LUE vs RUE and with max assist to maintain standing as pt frequently lost balance posteriorly and returned to sitting on stedy seat. Pt also engaged in reaching for cups while sitting on stedy seat with min assist for balance. Transferred sit>supine on mat table with mod assist and provided total assist for rolling supine<>prone. Attempted to have pt push up on elbows but pt unable despite max cuing and assist. Back in supine pt performed BLE bridging with therapist providing stability at BLE and cuing for repetitions with pt able to activate hip extensors but unable to clear buttocks from mat. While sitting EOM pt engaged in reaching forward to obtain cups from floor with min assist for balance and  assistance with maintaining B knees flexed as pt occasionally extends them, reducing her BOS. Task focused on anterior weight shifting, core/trunk strengthening as pt uprighted herself, and dynamic balance. In dayroom pt utilized cybex kinetron from w/c level with task focusing on alternating movements, BLE strengthening & NMR with pt requiring manual facilitation to complete task. At end of session pt left sitting in TIS w/c with chair alarm donned & family present in room.  Educated [name] on pt's anticipated level of care at d/c.  No c/o pain reported during session.  Therapy Documentation Precautions:  Precautions Precautions: Fall Precaution Comments: apraxic, somewhat impulsive Restrictions Weight Bearing Restrictions: No    Therapy/Group: Individual Therapy  [name] 03/24/2018, 2:40 PM

## 2018-03-24 NOTE — Progress Notes (Signed)
Occupational Therapy Session Note  Patient Details  Name: Maria Avila MRN: [id number] Date of Birth: April 17, 1951  Today's Date: 03/24/2018 OT Individual Time: 1445-1530 OT Individual Time Calculation (min): 45 min    Short Term Goals: Week 1:  OT Short Term Goal 1 (Week 1): Pt will complete bed mobility with max assist of one caregiver to decrease burden of care with bathing and dressing OT Short Term Goal 2 (Week 1): Pt will complete bathing with max assist  OT Short Term Goal 3 (Week 1): Pt will complete bed > w/c transfer to prepare for toilet transfers with max assist of one caregiver OT Short Term Goal 4 (Week 1): Pt will don pants with max assist  Skilled Therapeutic Interventions/Progress Updates:    Treatment session with focus on increased use of RUE, attention to task, and following one step commands.  Pt received upright in TIS w/c with sister present in room.  Pt initially resistant to therapy session but after encouragement from sister and discussion of leisure activities, pt agreeable.  Engaged in word search activity with focus on increased use of RUE.  Per discussion with PT and observation, noted that pt utilizes LUE majority of time - therefore increased attention on functional use of dominant RUE.  Pt required increased time to locate 3 words on large print word search without cues, then requiring max cues to locate 2 additional words.  Pt demonstrated difficulty maintaining grasp on pen - may benefit from built up handle or thicker pen.  Pt would also continue to circle 1-2 letters beyond the end of the word, despite cues to terminate on end of word.  Returned to room and transferred back to bed with use of Stedy.  Pt able to complete sit > stand with mod assist in [place] and 2nd person present for safety.  Left supine in bed with all needs in reach and husband arriving at end of session.  Therapy Documentation Precautions:  Precautions Precautions: Fall Precaution  Comments: apraxic, somewhat impulsive Restrictions Weight Bearing Restrictions: No Pain: Pain Assessment Pain Scale: 0-10 Pain Score: 2  Pain Location: Buttocks Pain Intervention(s): RN made aware;Repositioned   Therapy/Group: Individual Therapy  Simonne Come 03/24/2018, 3:53 PM

## 2018-03-24 NOTE — Progress Notes (Signed)
Physical Therapy Wound Treatment Patient Details  Name: Maria Avila MRN: [id number] Date of Birth: [birth year]  Today's Date: 03/24/2018 PT Individual Time: 0920-0953 PT Individual Time Calculation (min): 33 min   Subjective  Subjective: Pt denied any pain Patient and Family Stated Goals: Pt not stated Prior Treatments: Dressing changes  Pain Score: Pain Score: 0-No pain  Wound Assessment  Pressure Injury 03/22/18 Unstageable - Full thickness tissue loss in which the base of the ulcer is covered by slough (yellow, tan, gray, green or brown) and/or eschar (tan, brown or black) in the wound bed. Patient had MASD POA, patient continues to be  (Active)  Dressing Type Gauze (Comment);Foam;Moist to [employer] (skin prep) 03/24/2018  9:20 AM  Dressing Changed;Clean;Dry;Intact 03/24/2018  9:20 AM  Dressing Change Frequency Daily 03/24/2018  9:20 AM  State of Healing Eschar 03/24/2018  9:20 AM  Site / Wound Assessment Pink;Red;Yellow 03/24/2018  9:20 AM  % Wound base Red or Granulating 5% 03/24/2018  9:20 AM  % Wound base Yellow/Fibrinous Exudate 95% 03/24/2018  9:20 AM  % Wound base Black/Eschar 0% 03/24/2018  9:20 AM  % Wound base Other/Granulation Tissue (Comment) 0% 03/24/2018  9:20 AM  Peri-wound Assessment Excoriated;Maceration 03/24/2018  9:20 AM  Wound Length (cm) 4.5 cm 03/23/2018  9:12 AM  Wound Width (cm) 9 cm 03/23/2018  9:12 AM  Wound Depth (cm) 0.1 cm 03/23/2018  9:12 AM  Wound Surface Area (cm^2) 40.5 cm^2 03/23/2018  9:12 AM  Wound Volume (cm^3) 4.05 cm^3 03/23/2018  9:12 AM  Margins Unattached edges (unapproximated) 03/24/2018  9:20 AM  Drainage Amount Minimal 03/24/2018  9:20 AM  Drainage Description Serous 03/24/2018  9:20 AM  Treatment Debridement (Selective);Hydrotherapy (Pulse lavage);Packing (Saline gauze) 03/24/2018  9:20 AM   Santyl applied to wound bed prior to applying dressing.    Hydrotherapy Pulsed lavage therapy - wound location: sacrum Pulsed  Lavage with Suction (psi): 8 psi(8-12) Pulsed Lavage with Suction - Normal Saline Used: 1000 mL Pulsed Lavage Tip: Tip with splash shield Selective Debridement Selective Debridement - Location: sacrum Selective Debridement - Tools Used: Forceps;Scalpel Selective Debridement - Tissue Removed: yellow necrotic tissue   Wound Assessment and Plan  Wound Therapy - Assess/Plan/Recommendations Wound Therapy - Clinical Statement: Progressing with removal of necrotic tissue. Depth and undermining now present in wound.  Wound Therapy - Functional Problem List: decr mobility and sitting tolerance Factors Delaying/Impairing Wound Healing: Incontinence;Immobility;Multiple medical problems;Polypharmacy;Diabetes Mellitus Hydrotherapy Plan: Debridement;Dressing change;Patient/family education;Pulsatile lavage with suction Wound Therapy - Frequency: 6X / week Wound Therapy - Follow Up Recommendations: Home health RN Wound Plan: See above  Wound Therapy Goals- Improve the function of patient's integumentary system by progressing the wound(s) through the phases of wound healing (inflammation - proliferation - remodeling) by: Decrease Necrotic Tissue to: 80 Decrease Necrotic Tissue - Progress: Progressing toward goal Increase Granulation Tissue to: 20 Increase Granulation Tissue - Progress: Progressing toward goal  Goals will be updated until maximal potential achieved or discharge criteria met.  Discharge criteria: when goals achieved, discharge from hospital, MD decision/surgical intervention, no progress towards goals, refusal/missing three consecutive treatments without notification or medical reason.  GP     Shary Decamp [place] 03/24/2018, 10:40 AM  [place] Pager [phone] Office [phone]

## 2018-03-24 NOTE — Patient Care Conference (Signed)
Inpatient RehabilitationTeam Conference and Plan of Care Update Date: 03/24/2018   Time: 10:45 AM    Patient Name: Maria Avila      Medical Record Number: [id number]  Date of Birth: 21-Nov-1950 Sex: Female         Room/Bed: 4W14C/4W14C-01 Payor Info: Payor: [employer] MEDICARE / Plan: [facility] MEDICARE / Product Type: *No Product type* /    Admitting Diagnosis: L CVA  Admit Date/Time:  03/17/2018  2:25 PM Admission Comments: No comment available   Primary Diagnosis:  <principal problem not specified> Principal Problem: <principal problem not specified>  Patient Active Problem List   Diagnosis Date Noted  . Acute blood loss anemia   . Impulsive   . Stroke due to embolism ([place]) 03/17/2018  . Hypokalemia   . ETOH abuse   . Dysphagia, post-stroke   . Benign essential HTN   . Acute deep vein thrombosis (DVT) of distal vein of both lower extremities (HCC)   . Sinus tachycardia   . Chest tube in place   . Esophageal perforation   . Perforation esophagus   . Sepsis with acute renal failure ([place])   . Acute deep vein thrombosis (DVT) of both peroneal veins   . Leukocytosis   . Sepsis due to Pseudomonas species ([place])   . Cerebral embolism with cerebral infarction 03/09/2018  . Malnutrition of moderate degree 03/08/2018  . Empyema ([place]) 03/04/2018  . Pressure injury of skin 03/04/2018  . Acute respiratory failure with hypercapnia ([place]) 03/04/2018  . Diarrhea 02/26/2018  . Pancytopenia ([place]) 02/19/2018  . Hypotension 02/19/2018  . Severe protein-calorie malnutrition ([place]) 02/17/2018  . Colitis 02/16/2018  . Weakness 02/16/2018  . Nausea vomiting and diarrhea 02/16/2018  . Diabetes ([place]) 02/16/2018  . Essential hypertension 02/16/2018  . Anemia 02/16/2018    Expected Discharge Date: Expected Discharge Date: 04/09/18  Team Members Present: Physician leading conference: Dr. Alysia Penna Social Worker Present: Ovidio Kin, LCSW Nurse Present: Benjie Karvonen, RN PT  Present: Lavone Nian, PT OT Present: Simonne Come, OT SLP Present: Charolett Bumpers, SLP PPS Coordinator present : Daiva Nakayama, RN, CRRN     Current Status/Progress Goal Weekly Team Focus  Medical   Stage III decubitus ulcer on the buttocks now with Santyl collagenase as well as hydrotherapy, new onset seizure now on Keppra  Increased level of alertness and participation with therapy improvement ability to reduce skin issues  Medication management of seizure disorder, wound care   Bowel/Bladder   Incontinent of bowel; I&O cath q6H PRN for no void; LBM 03/24/18  Continent of bowel and bladder with mod assist  Assess bowel and bladder needs q shift and PRN   Swallow/Nutrition/ Hydration   Supervision A on dys 3 and thin   Mod I  Regular trial tray   ADL's   mod assist bed mobility, total +2 for bathing and dressing, using lift for transfers  Min-mod assist   ADL retraining, dynamic sitting balance, sit > stand, initiation, sequencing   Mobility   min assist rolling, max assist sidelying>sitting, max assist squat pivot, 3 muskateers + w/c follow for gait x 5 ft  currently supervision overall, may have to downgrade depending on progress  transfers, balance, gait, bed mobility, NMR, activity tolerance, pt education   Communication             Safety/Cognition/ Behavioral Observations  Min A orientation, Mod-Max A   Min A, Mod I orientation   sustained attention, basic problem solving, recall, intellectual awareness and  orientation   Pain   No c/o pain  Pain </= 2/10  Assess pain q shift and PRN and medicate as necessary   Skin   MASD to perineum/groin, skin tears to vagina inner buttocks, treated with Gerhardts; stage 3 to sacrum, treated with hydrotherapy, santyl, and wet to dry dressing covered with foam   Prevent further breakdown and infection  Assess skin q shift and PRN and address any areas of concern      *See Care Plan and progress notes for long and short-term goals.      Barriers to Discharge  Current Status/Progress Possible Resolutions Date Resolved   Physician    Wound Care;Medical stability     Progress is slow  Continue rehab see above      Nursing                  PT  Home environment access/layout  4 steps to enter home without rails, unsure if family can provide necessary cognitive/physical 24 hr assist at d/c              OT                  SLP                SW                Discharge Planning/Teaching Needs:  Husband here daily and wants to take her home as long as he can manage her care. Pt lethargic and difficulty participating in therapies      Team Discussion:  Goals min-mod level of care, goals downgraded from evaluations. Issues with lethargy and confusion-MD aware and working on sleeping better with medications and sleep schedule. IV fluids at night due to not drinking enough. Seizure Monday started on keppra. Hydrotherapy for sacrum wound. Trial of regular diet if more alert today. Husband here daily to provide support.  Revisions to Treatment Plan:  DC 12/6    Continued Need for Acute Rehabilitation Level of Care: The patient requires daily medical management by a physician with specialized training in physical medicine and rehabilitation for the following conditions: Daily direction of a multidisciplinary physical rehabilitation program to ensure safe treatment while eliciting the highest outcome that is of practical value to the patient.: Yes Daily medical management of patient stability for increased activity during participation in an intensive rehabilitation regime.: Yes Daily analysis of laboratory values and/or radiology reports with any subsequent need for medication adjustment of medical intervention for : Neurological problems;Mood/behavior problems;Wound care problems;Nutritional problems   I attest that I was present, lead the team conference, and concur with the assessment and plan of the team.   Elease Hashimoto 03/24/2018, 12:55 PM

## 2018-03-24 NOTE — Progress Notes (Addendum)
Speech Language Pathology Daily Session Note  Patient Details  Name: Danijah E Martinique MRN: [id number] Date of Birth: 11-14-1950  Today's Date: 03/24/2018 SLP Individual Time: 1100-1158 SLP Individual Time Calculation (min): 58 min  Short Term Goals: Week 1: SLP Short Term Goal 1 (Week 1): Pt will utilize compensatory memory aids to recall orientation information with Min A cues.  SLP Short Term Goal 2 (Week 1): Pt will sustain attention to basic task for ~ 5 minutes with Mod A cues.  SLP Short Term Goal 3 (Week 1): Pt will complete basic problem solving tasks with Min A cues.  SLP Short Term Goal 4 (Week 1): Pt will demonstrate intellectual awareness by listing 2 physical and 2 cognitive deficits related to acute illness with Mod A cues.  SLP Short Term Goal 5 (Week 1): Pt will consume current diet without overt s/s of dysphagia/aspiration with supervision cues for use of compensatory swallow strategies.  SLP Short Term Goal 6 (Week 1): Pt will consume trials of regular diet textures with minimal s/s of dysphagia/aspiration with Min A cues for use of compensatory swallow strategies.   Skilled Therapeutic Interventions:Skilled ST services focused on swallow and cognitive skills. SLP facilitated basic problem solving sorting colored PEG given two colors Max A fade to Mod A. Pt required mod A verbal cues in 4-5 minute intervals. SLP facilitated intellectual awareness skills, pt demonstrated ability to list 1 cognitive and 1 physical deficits and required mod A verbal cues to list a second in each category. SLP facilitated trial tray of regular textured foods, pt demonstrated prolonged mastication with appropriate oral clearance, however pt complained of irritation with dentures. Pt and Pt's husband confirmed that pt normally takes dentures in and out, and does not like to wear adhesive cream. Pt refused application of adhesive cream. SLP recommends another trial tray in future session, since only one item  was regular. Pt required mod A verbal cues for problem solving during self-feeding. SLP facilitated orientation of time, place and situation requiring min A verbal cues for external cues. Pt was left in room with call bell within reach and bed alarm set. SLP reccomends to continue skilled services.     Pain Pain Assessment Pain Scale: 0-10 Pain Score: 0-No pain  Therapy/Group: Individual Therapy  Breindy Meadow  [facility] 03/24/2018, 12:03 PM

## 2018-03-25 ENCOUNTER — Inpatient Hospital Stay (HOSPITAL_COMMUNITY): Payer: Self-pay | Admitting: Occupational Therapy

## 2018-03-25 ENCOUNTER — Inpatient Hospital Stay (HOSPITAL_COMMUNITY): Payer: Self-pay | Admitting: Speech Pathology

## 2018-03-25 ENCOUNTER — Inpatient Hospital Stay (HOSPITAL_COMMUNITY): Payer: Self-pay | Admitting: Physical Therapy

## 2018-03-25 MED ORDER — BETHANECHOL CHLORIDE 10 MG PO TABS
5.0000 mg | ORAL_TABLET | Freq: Three times a day (TID) | ORAL | Status: DC
  Administered 2018-03-25 (×3): 5 mg via ORAL
  Filled 2018-03-25 (×3): qty 1

## 2018-03-25 MED ORDER — CALCIUM POLYCARBOPHIL 625 MG PO TABS
1250.0000 mg | ORAL_TABLET | Freq: Two times a day (BID) | ORAL | Status: DC
  Administered 2018-03-25 – 2018-04-09 (×29): 1250 mg via ORAL
  Filled 2018-03-25 (×31): qty 2

## 2018-03-25 NOTE — Progress Notes (Signed)
Physical Therapy Session Note  Patient Details  Name: Maria Avila MRN: [id number] Date of Birth: [birth year]  Today's Date: 03/25/2018 PT Individual Time: 1445-1530 PT Individual Time Calculation (min): 45 min   Short Term Goals: Week 1:  PT Short Term Goal 1 (Week 1): pt will move supine>< sit with min assist PT Short Term Goal 2 (Week 1): pt will transfer bed>< w/c with assistance of 1 person PT Short Term Goal 3 (Week 1): pt will propel w/c x 50' with min assist PT Short Term Goal 4 (Week 1): pt will initiate gait with assistance of 2 persons  Skilled Therapeutic Interventions/Progress Updates:    Patient received up in [facility] just finishing session with OT, pleasant and willing to participate in therapy. Transported to PT gym with totalA in [facility], then focused session on standing activities within the frame of the stedy. She continues to require MaxAx2 for sit to stand from height of WC, but was able to perform multiple sit to stands from elevated height of stedy with fluctuating levels of assist (Min-MaxA x1) today. She demonstrates very poor balance when standing and required Min-ModA to maintain balance due to posterior and intermittent lateral leans, also has difficulty with frequent knee buckling even with support from stedy. Worked on cognitive training with building puzzles with PVC pipes in standing requiring MaxA for correct completion of task and task also simplified to one piece at a time as patient became overwhelmed when faced with multiple puzzle pieces. Also worked on reaching for cups with alternating UEs, able to reach well with L UE but requires ModA for effective reaching with minimal compensation with R UE. She was fatigued at EOS and was returned to her room with totalA in [facility], left up in chair with her husband present and reviewed pressure relief schedule and use of TIS chair with husband, who voices agreement to let nursing know when he leaves so pressure relief schedule can  continue without interruption. All other needs met this afternoon.   Therapy Documentation Precautions:  Precautions Precautions: Fall Precaution Comments: apraxic, somewhat impulsive Restrictions Weight Bearing Restrictions: No General:   Vital Signs:  Pain: Pain Assessment Pain Scale: 0-10 Pain Score: 0-No pain    Therapy/Group: Individual Therapy  Deniece Ree PT, DPT, CBIS  Supplemental Physical Therapist [facility]    Pager [phone] Acute Rehab Office [phone]   03/25/2018, 3:43 PM

## 2018-03-25 NOTE — Progress Notes (Signed)
[place] PHYSICAL MEDICINE & REHABILITATION PROGRESS NOTE   Subjective/Complaints:   Pt oriented to hospital but not to CVA or time (October)  ROS: Unreliable due to cognition, no resp distress  Objective:   No results found. No results for input(s): WBC, HGB, HCT, PLT in the last 72 hours. No results for input(s): NA, K, CL, CO2, GLUCOSE, BUN, CREATININE, CALCIUM in the last 72 hours.  Intake/Output Summary (Last 24 hours) at 03/25/2018 0803 Last data filed at 03/25/2018 0033 Gross per 24 hour  Intake 634.82 ml  Output 800 ml  Net -165.18 ml     Physical Exam: Vital Signs Blood pressure 104/72, pulse 96, temperature (!) 97.5 F (36.4 C), resp. rate 15, weight 71.2 kg, SpO2 100 %. Constitutional: No distress . Vital signs reviewed. HENT: Normocephalic.  Atraumatic. Eyes: EOMI. No discharge. Cardiovascular: RRR.  No JVD. Respiratory: crackles at Left base no resp distress, no wheezes GI: BS +. Non-distended. Musculoskeletal: She exhibits bilateral lower extremity edema Neurological: She is alert and oriented x1.  Delayed processing.  Motor: 4/5 bue and ble  Skin: Sacral wound dressed. Left CT site sutures removed  Psychiatric: Restless.  Confused.  Assessment/Plan: 1. 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  Physiatrist is providing close team supervision and 24 hour management of active medical problems listed below.  Physiatrist and rehab team continue to assess barriers to discharge/monitor patient progress toward functional and medical goals  Care Tool:  Bathing              Bathing assist Assist Level: 2 Helpers(per reports)     Upper Body Dressing/Undressing Upper body dressing   What is the patient wearing?: Pull over shirt    Upper body assist Assist Level: Moderate Assistance - Patient 50 - 74%    Lower Body  Dressing/Undressing Lower body dressing      What is the patient wearing?: Pants, Incontinence brief     Lower body assist Assist for lower body dressing: Maximal Assistance - Patient 25 - 49%     Toileting Toileting    Toileting assist Assist for toileting: 2 Helpers     Transfers Chair/bed transfer  Transfers assist     Chair/bed transfer assist level: Maximal Assistance - Patient 25 - 49%     Locomotion Ambulation   Ambulation assist   Ambulation activity did not occur: Safety/medical concerns(apraxia)  Assist level: (3 muskateers + w/c follow)   Max distance: 5 ft    Walk 10 feet activity   Assist           Walk 50 feet activity   Assist           Walk 150 feet activity   Assist           Walk 10 feet on uneven surface  activity   Assist           Wheelchair     Assist Will patient use wheelchair at discharge?: Yes Type of Wheelchair: Manual    Wheelchair assist level: Maximal Assistance - Patient 25 - 49% Max wheelchair distance: 15    Wheelchair 50 feet with 2 turns activity    Assist    Wheelchair 50 feet with 2 turns activity did not occur: Safety/medical concerns(fatigue)   Assist Level: Minimal Assistance - Patient > 75%   Wheelchair 150 feet activity     Assist Wheelchair 150 feet activity  did not occur: Safety/medical concerns(fatigue)         Medical Problem List and Plan: 1.  Limitations in mobility, self-care, endurance secondary to L>R infarcts and multiple medical issues.  -Continue CIR Tent D/C 12/6 min/mod A level  Repeat head CT no hemorrhagic conversion  AMS with witnessed short seizure, EEG shows generalized slowing.  Also starting po Keppra monitoring response to new medication this point it does not appear that this is causing additional sedation   2.  BLE DVTs/Anticoagulation: Pharmaceutical: Other (comment)-on Eliquis 3. Pain Management: tylenol prn.  4. Mood: LCSW to follow  for evaluation and support.  5. Neuropsych: This patient is not capable of making decisions on her own behalf. 6. Skin/Wound Care: Air mattress due to sacral breakdown.   -nutrition/local skin care to chest/sacrum 7. Fluids/Electrolytes/Nutrition:  Continue dysphagia 3 thin diet. - may d/c CBG Off TNA. I~628ml po  BMP within acceptable range on 11/16-  8. Empyema: Continue IV Zosyn end date 11/21. (Levaquin stopped due to concerns of lowering seizure threshold)  -local care to CT site, Needs IS for atelectasis 9. Malnutrition/Heavy alcohol use: Poor intake with 40 lbs weight loss.   -B12 and folate levels normal 10. Diarrhea: Added probiotic and fiber. Discontinued Ensure and added prostat.    -rectal tube out  -stool still loose but becoming more formed. Likely abx effect  -no indications that it's infectious although WBC sl elevated (11.1 on 11/15)  -added imodium 11. Hypokalemia: Likely due to nutritional deficits as well as diarrhea.   -Potassium supplement  Repeat labs ordered  Potassium 3.5 on 11/16  Recheck on 11/22 12. Leucocytosis with neutropenia: Resolved  Continue to monitor.  13.  Acute blood loss anemia  Hb 9.0 on 11/16 we will recheck 11/22  Cont to monitor   14.  Probable post CVA seizure,Keppra, if patient becomes more lethargic we can reduce the dose at 250 mg twice a day 15.  Urinary retention,still requiring  ICP, on  Flomax, add urecholine LOS: 8 days A FACE TO FACE EVALUATION WAS PERFORMED  Charlett Blake 03/25/2018, 8:03 AM

## 2018-03-25 NOTE — Progress Notes (Signed)
Occupational Therapy Weekly Progress Note  Patient Details  Name: Maria Avila MRN: [id number] Date of Birth: Apr 16, 1951  Beginning of progress report period: March 18, 2018 End of progress report period: March 25, 2018  Today's Date: 03/25/2018 OT Individual Time: 8299-3716 OT Individual Time Calculation (min): 55 min    Patient has met 4 of 4 short term goals.  Pt is making steady progress towards goals.  Pt is demonstrating increased arousal and participation in therapy sessions.  Pt has demonstrated improvements from requiring +2 assistance with bed mobility, self-care tasks, and transfers via lift to requiring mod assist for bed mobility, max assist LB dressing at bed level, and squat pivot transfers max assist of 1 caregiver.  Pt is demonstrating increased ability to follow one step commands, however still requires increased time for processing.  Pt demonstrates motor apraxia and benefits from increased time to allow for initiation and tactile cues to facilitate increased positioning to allow for increased initiation during self-care tasks and mobility.  Pt continues to demonstrate decreased functional use of RUE, requiring manual facilitation when reaching for items and to incorporate into functional tasks.    Patient continues to demonstrate the following deficits: muscle weakness, decreased cardiorespiratoy endurance, impaired timing and sequencing, unbalanced muscle activation, motor apraxia, decreased coordination and decreased motor planning, decreased attention, decreased awareness, decreased problem solving, decreased safety awareness and decreased memory and decreased sitting balance, decreased standing balance, hemiplegia and decreased balance strategies and therefore will continue to benefit from skilled OT intervention to enhance overall performance with BADL and Reduce care partner burden.  Patient progressing toward long term goals..  Continue plan of care.  OT Short  Term Goals Week 1:  OT Short Term Goal 1 (Week 1): Pt will complete bed mobility with max assist of one caregiver to decrease burden of care with bathing and dressing OT Short Term Goal 1 - Progress (Week 1): Met OT Short Term Goal 2 (Week 1): Pt will complete bathing with max assist  OT Short Term Goal 2 - Progress (Week 1): Met OT Short Term Goal 3 (Week 1): Pt will complete bed > w/c transfer to prepare for toilet transfers with max assist of one caregiver OT Short Term Goal 3 - Progress (Week 1): Met OT Short Term Goal 4 (Week 1): Pt will don pants with max assist OT Short Term Goal 4 - Progress (Week 1): Met Week 2:  OT Short Term Goal 1 (Week 2): Pt will don pants with mod assist at sit > stand level OT Short Term Goal 2 (Week 2): Pt will complete bathing with mod assist at sit > stand level OT Short Term Goal 3 (Week 2): Pt will complete toilet transfer with max assist   Skilled Therapeutic Interventions/Progress Updates:    Treatment session with focus on self-care retraining, functional mobility, and increased use of dominant RUE.  Pt received supine in bed agreeable to dressing task.  Therapist assisted pt with LB dressing by assisting pt in crossing legs to obtain figure 4 position to increase success with threading pant legs.  Attempted bridging to allow pt to pull pants over hips, however due to BLE weakness pt unsuccessful with bridging but able to assume sidelying position with min-mod assist to allow therapist to pull pants over hips.  Pt completed sidelying to sitting at EOB with mod assist.  Complete squat pivot transfer to w/c with max assist, requiring max multimodal cues for anterior weight shift when transferring to Rt.  Engaged  in reaching activity in sitting on edge of mat with focus on following pattern given verbally, sitting balance, and use of RUE.  Max cues to replicate pattern with pt able to identify errors with min cues but required max cues to correct errors.  Hand over  hand assist and tactile cues provided to Rt elbow to minimize effects of gravity and allow pt increased control and ROM when reaching to obtain items.  Completed squat pivot transfer to Lt with max assist and improved sequencing and weight shift when transferring to Lt.  Pt remained in TIS w/c tilted back to allow pressure relief on buttocks with husband present to provide supervision.  Therapy Documentation Precautions:  Precautions Precautions: Fall Precaution Comments: apraxic, somewhat impulsive Restrictions Weight Bearing Restrictions: No Pain: Pain Assessment Pain Scale: 0-10 Pain Score: 0-No pain   Therapy/Group: Individual Therapy  Simonne Come 03/25/2018, 11:05 AM

## 2018-03-25 NOTE — Progress Notes (Signed)
Occupational Therapy Session Note  Patient Details  Name: Maria Avila MRN: [id number] Date of Birth: [birth year]  Today's Date: 03/25/2018 OT Individual Time: 1400-1445 OT Individual Time Calculation (min): 45 min    Short Term Goals: Week 2:  OT Short Term Goal 1 (Week 2): Pt will don pants with mod assist at sit > stand level OT Short Term Goal 2 (Week 2): Pt will complete bathing with mod assist at sit > stand level OT Short Term Goal 3 (Week 2): Pt will complete toilet transfer with max assist   Skilled Therapeutic Interventions/Progress Updates:    Pt completed supine to sit EOB with mod assist and max demonstrational cueing to sequence steps secondary to apraxia.  Once on the EOB, therapist and tech utilized the [facility] for pt complete sit to stand and standing in order to get cleaned up for bowel incontinence.  She was able to complete sit to stand transition from the bed with mod assist in the [place], and maintain standing with overall min assist.  Total assist +2 (p 20%) for cleaning up peri area, with pt having continuous stools once standing.  Used the [facility] for transfer to the [facility] where she completed BM.  Total +2 (pt 20%) for standing in the Stedy once again to pull brief over hips.  Max assist for donning new pants over feet with total +2 to stand and pull them up over her hips as well.  Finished session with transfer back to the bed with the [facility] and stand pivot transfer to the wheelchair on the right from the bed.  Total assist for stand pivot secondary to decreased forward weight shift and increased bilateral knee buckling.  Pt left with PT to continue therapy at end of OT session.    Therapy Documentation Precautions:  Precautions Precautions: Fall Precaution Comments: apraxic, somewhat impulsive Restrictions Weight Bearing Restrictions: No  Pain: Pain Assessment Pain Scale: 0-10 Pain Score: 0-No pain ADL: Therapy/Group: Individual Therapy  Saylor Sheckler  OTR/L 03/25/2018, 4:25 PM

## 2018-03-25 NOTE — Progress Notes (Signed)
Physical Therapy Wound Treatment Patient Details  Name: Nykayla E Martinique MRN: [id number] Date of Birth: 16-Sep-1950  Today's Date: 03/25/2018 PT Individual Time: 0920-0954 PT Individual Time Calculation (min): 34 min   Subjective  Subjective: Pt initially denied any pain. Pt offerred tyelenol by nursing and she accepted. Patient and Family Stated Goals: Pt not stated Prior Treatments: Dressing changes  Pain Score: Pt denied pain before, during, and after treatment. Pt did ask for pain meds after being in chair prior to our arrival. Nursing gave meds.   Wound Assessment  Pressure Injury 03/22/18 Unstageable - Full thickness tissue loss in which the base of the ulcer is covered by slough (yellow, tan, gray, green or brown) and/or eschar (tan, brown or black) in the wound bed. Patient had MASD POA, patient continues to be  (Active)  Dressing Type Gauze (Comment);Foam;Moist to [employer] (skin prep) 03/25/2018  9:00 AM  Dressing Changed;Clean;Dry;Intact 03/25/2018  9:00 AM  Dressing Change Frequency Daily 03/25/2018  9:00 AM  State of Healing Eschar 03/25/2018  9:00 AM  Site / Wound Assessment Pink;Red;Yellow 03/25/2018  9:00 AM  % Wound base Red or Granulating 10% 03/25/2018  9:00 AM  % Wound base Yellow/Fibrinous Exudate 90% 03/25/2018  9:00 AM  % Wound base Black/Eschar 0% 03/25/2018  9:00 AM  % Wound base Other/Granulation Tissue (Comment) 0% 03/25/2018  9:00 AM  Peri-wound Assessment Excoriated;Maceration 03/25/2018  9:00 AM  Wound Length (cm) 4.5 cm 03/23/2018  9:12 AM  Wound Width (cm) 9 cm 03/23/2018  9:12 AM  Wound Depth (cm) 0.1 cm 03/23/2018  9:12 AM  Wound Surface Area (cm^2) 40.5 cm^2 03/23/2018  9:12 AM  Wound Volume (cm^3) 4.05 cm^3 03/23/2018  9:12 AM  Margins Unattached edges (unapproximated) 03/25/2018  9:00 AM  Drainage Amount Minimal 03/25/2018  9:00 AM  Drainage Description Serous 03/25/2018  9:00 AM  Treatment Debridement (Selective);Hydrotherapy (Pulse  lavage);Packing (Saline gauze) 03/25/2018  9:00 AM   Santyl applied to wound bed prior to applying dressing.    Hydrotherapy Pulsed lavage therapy - wound location: sacrum Pulsed Lavage with Suction (psi): 8 psi(8-12) Pulsed Lavage with Suction - Normal Saline Used: 1000 mL Pulsed Lavage Tip: Tip with splash shield Selective Debridement Selective Debridement - Location: sacrum Selective Debridement - Tools Used: Forceps;Scalpel Selective Debridement - Tissue Removed: yellow necrotic tissue   Wound Assessment and Plan  Wound Therapy - Assess/Plan/Recommendations Wound Therapy - Clinical Statement: Progressing with removal of necrotic tissue. Much of the remaining necrotic tissue adhered to viable tissue making selective debridement difficult.  Wound Therapy - Functional Problem List: decr mobility and sitting tolerance Factors Delaying/Impairing Wound Healing: Incontinence;Immobility;Multiple medical problems;Polypharmacy;Diabetes Mellitus Hydrotherapy Plan: Debridement;Dressing change;Patient/family education;Pulsatile lavage with suction Wound Therapy - Frequency: 6X / week Wound Therapy - Follow Up Recommendations: Home health RN Wound Plan: See above  Wound Therapy Goals- Improve the function of patient's integumentary system by progressing the wound(s) through the phases of wound healing (inflammation - proliferation - remodeling) by: Decrease Necrotic Tissue to: 80 Decrease Necrotic Tissue - Progress: Progressing toward goal Increase Granulation Tissue to: 20 Increase Granulation Tissue - Progress: Progressing toward goal  Goals will be updated until maximal potential achieved or discharge criteria met.  Discharge criteria: when goals achieved, discharge from hospital, MD decision/surgical intervention, no progress towards goals, refusal/missing three consecutive treatments without notification or medical reason.  GP     Shary Decamp [place] 03/25/2018, 10:16 AM Long Pager [phone] Office [phone]

## 2018-03-25 NOTE — Progress Notes (Signed)
Speech Language Pathology Weekly Progress and Session Note  Patient Details  Name: Maria Avila MRN: [id number] Date of Birth: 16-Nov-1950  Beginning of progress report period: March 18, 2018 End of progress report period: March 25, 2018  Today's Date: 03/25/2018 SLP Individual Time: 0730-0830 SLP Individual Time Calculation (min): 60 min  Short Term Goals: Week 1: SLP Short Term Goal 1 (Week 1): Pt will utilize compensatory memory aids to recall orientation information with Min A cues.  SLP Short Term Goal 1 - Progress (Week 1): Not met SLP Short Term Goal 2 (Week 1): Pt will sustain attention to basic task for ~ 5 minutes with Mod A cues.  SLP Short Term Goal 2 - Progress (Week 1): Met SLP Short Term Goal 3 (Week 1): Pt will complete basic problem solving tasks with Min A cues.  SLP Short Term Goal 3 - Progress (Week 1): Not met SLP Short Term Goal 4 (Week 1): Pt will demonstrate intellectual awareness by listing 2 physical and 2 cognitive deficits related to acute illness with Mod A cues.  SLP Short Term Goal 4 - Progress (Week 1): Not met SLP Short Term Goal 5 (Week 1): Pt will consume current diet without overt s/s of dysphagia/aspiration with supervision cues for use of compensatory swallow strategies.  SLP Short Term Goal 5 - Progress (Week 1): Met SLP Short Term Goal 6 (Week 1): Pt will consume trials of regular diet textures with minimal s/s of dysphagia/aspiration with Min A cues for use of compensatory swallow strategies.  SLP Short Term Goal 6 - Progress (Week 1): Not met    New Short Term Goals: Week 2: SLP Short Term Goal 1 (Week 2): Pt will utilize compensatory memory aids to recall orientation information with Mod A cues.  SLP Short Term Goal 2 (Week 2): Pt will sustain attention to basic task for ~ 15 minutes with Mod A cues.  SLP Short Term Goal 3 (Week 2): Pt will complete basic problem solving tasks with Mod A cues.  SLP Short Term Goal 4 (Week 2): Pt will  demonstrate intellectual awareness by listing 2 physical and 2 cognitive deficits related to acute illness with Mod A cues.   Weekly Progress Updates: Pt has made slow progress this reporting period and has a result she has met 2 out of 6 STGs. Pt has made progress in maintaining alertness and in consuming dysphagia 3 diet. Pt continues to demonstrate prolonged mastication of regular diet textures and has voiced request to remain on softer solids. Therefore LTG targeting regular diet textures has been discontinued. Pt's mentation has fluctuated significantly over this reporting period and as a result, many of her STG and LTGs were downgraded. Pt continues to require overall Max A for orientation, recall of general information, basic problem solving and awareness. Skilled ST continues to be required to target these deficits. Anticipate that pt will require Mod A support at discharge with follow up [place].      Intensity: Minumum of 1-2 x/day, 30 to 90 minutes Frequency: 3 to 5 out of 7 days Duration/Length of Stay: 12/6 Treatment/Interventions:     Daily Session  Skilled Therapeutic Interventions:   Skilled treatment session focused on dysphagia and cognition goals. SLP received pt upright in wheelchair, poorly positioned. Pt didn't appear able to maintain postural support. SLP facilitated by providing Max A cues to reposition for better comfort. Pt consuming dysphagia 3 meal with mildly prolonged mastication and complete oral clearing of soft solids likely d/t  decreased attention/mentation. Pt states that she prefers dysphagia 3 and therefore goals for regular diet trials have been discontinued. Pt with overall confusion and required Max A cues to read calendar. Pt with request to use bathroom, Max A multimodal cues required for use of Stedy with plus 2 needed to transfer to recliner after use of toilet. Orientation to day and activites within day given but unable to decrease pt's level of confusion.  Pt left upright in recliner, pillows placed for increased postural control and lap alarm on with all needs within reach. Continue per current plan of care.       General    Pain Pain Assessment Pain Scale: 0-10 Pain Score: 0-No pain Pain Type: Acute pain Pain Location: Buttocks Pain Orientation: Right;Left;Mid Pain Descriptors / Indicators: Burning;Aching;Discomfort Pain Frequency: Intermittent Pain Onset: Gradual Patients Stated Pain Goal: 0 Pain Intervention(s): Medication (See eMAR) Multiple Pain Sites: No  Therapy/Group: Individual Therapy  Celedonio Sortino 03/25/2018, 12:45 PM

## 2018-03-25 NOTE — Progress Notes (Signed)
Nutrition Follow-up  DOCUMENTATION CODES:   Non-severe (moderate) malnutrition in context of chronic illness  INTERVENTION:   -MVI with minerals daily -Continue 30 ml Prostat BID, each supplement provides 100 kcals and 15 grams protein -Continue Lactaid TID with meals -Magic Cup TID with meals, each supplement provides 290 kcals and 9 grams protein  NUTRITION DIAGNOSIS:   Moderate Malnutrition related to chronic illness(chronic colitis) as evidenced by mild muscle depletion, moderate muscle depletion, mild fat depletion, moderate fat depletion.  Ongoing  GOAL:   Patient will meet greater than or equal to 90% of their needs  Progressing  MONITOR:   PO intake, Supplement acceptance, Diet advancement, I & O's, Weight trends, Labs, Skin  REASON FOR ASSESSMENT:   Malnutrition Screening Tool, Consult Calorie Count  ASSESSMENT:   67 year old female with PMH significant for T2DM, HTN, heavy alcohol use, lymphocytosis/leucopenia, and colitis with 6 month history of N/V/D and anorexia with 40 lb weight loss and abdominal pain. Pt was admitted on 02/16/18 with negative extensive work-up and was discharged to SNF for rehab. Pt was readmitted on 03/04/18 with AMS, hypoxia, and tachycardia due to sepsis with near complete opacification of left chest due to empyema. Pt was intubated and left chest tube placed with drainage of purulent material. Pt started on IV antibiotics. Pt extubated 11/4. Gastrografin swallow negative for perforation, empyema felt likely due to aspiration PNA. Brain MRI reviewed showing mulitple CVAs. Chest tube treated with tPA for thrombolysis and to help with reexpansion of lung and discontinued on 11/9 as non-functional. Pt received TPN during previous admission (11/2-11/9).  11/18- per [place] note, pt with unstageable pressure injury to sacrum 11/20- hydrotherapy initiated  Spoke with pt and husband at bedside; both were in good spirits and pleased with progress in  rehab. Pt and husband remembered this RD from inpatient hospitalization; pt appeared physically better from RD recollection.   Pt reports appetite is improving; observed lunch trayp pt consumed 100% of lactaid milk, 100% of chicken, and a few bites of broccoli. Documented meal completion 25-60%.  Pt and husband report pt is taking Prostat supplement well and expressed understanding of importance of taking Prostat supplement. Per pt husband, pt was consuming Ensure in the past, however, this was discontinued due to "it made her stomach hurt". Pt tolerates both lactaid and ice cream well; pt is interested in trying [employer] supplement. Reinforced importance of good meal and supplement intake to promote healing.   Labs reviewed. CBGS WDL.   Diet Order:   Diet Order            DIET DYS 3 Room service appropriate? Yes; Fluid consistency: Thin  Diet effective now              EDUCATION NEEDS:   Education needs have been addressed  Skin:  Skin Assessment: Skin Integrity Issues: Skin Integrity Issues:: Incisions, Stage III, Unstageable Stage II: n/a Stage III: rt/lt ischial tuberosity Unstageable: sacrum Incisions: open lt flank, perineum Other: n/a  Last BM:  03/25/18  Height:   Ht Readings from Last 1 Encounters:  03/10/18 5\' 2"  (1.575 m)    Weight:   Wt Readings from Last 1 Encounters:  03/23/18 71.2 kg    Ideal Body Weight:  50 kg  BMI:  Body mass index is 28.71 kg/m.  Estimated Nutritional Needs:   Kcal:  1900-2100  Protein:  105-120 grams  Fluid:  > 1.9 L    Jaziah Kwasnik A. Jimmye Norman, RD, LDN, CDE Pager: [phone] After hours  Pager: [phone]

## 2018-03-26 ENCOUNTER — Inpatient Hospital Stay (HOSPITAL_COMMUNITY): Payer: Self-pay | Admitting: Speech Pathology

## 2018-03-26 ENCOUNTER — Inpatient Hospital Stay (HOSPITAL_COMMUNITY): Payer: Self-pay | Admitting: Physical Therapy

## 2018-03-26 ENCOUNTER — Inpatient Hospital Stay (HOSPITAL_COMMUNITY): Payer: Self-pay | Admitting: Occupational Therapy

## 2018-03-26 LAB — CBC
HEMATOCRIT: 25.9 % — AB (ref 36.0–46.0)
Hemoglobin: 7.9 g/dL — ABNORMAL LOW (ref 12.0–15.0)
MCH: 30.5 pg (ref 26.0–34.0)
MCHC: 30.5 g/dL (ref 30.0–36.0)
MCV: 100 fL (ref 80.0–100.0)
PLATELETS: 219 10*3/uL (ref 150–400)
RBC: 2.59 MIL/uL — ABNORMAL LOW (ref 3.87–5.11)
RDW: 17.8 % — AB (ref 11.5–15.5)
WBC: 7.2 10*3/uL (ref 4.0–10.5)
nRBC: 0 % (ref 0.0–0.2)

## 2018-03-26 LAB — BASIC METABOLIC PANEL
Anion gap: 4 — ABNORMAL LOW (ref 5–15)
BUN: 17 mg/dL (ref 8–23)
CALCIUM: 8.5 mg/dL — AB (ref 8.9–10.3)
CO2: 24 mmol/L (ref 22–32)
CREATININE: 0.74 mg/dL (ref 0.44–1.00)
Chloride: 113 mmol/L — ABNORMAL HIGH (ref 98–111)
Glucose, Bld: 79 mg/dL (ref 70–99)
Potassium: 3.9 mmol/L (ref 3.5–5.1)
SODIUM: 141 mmol/L (ref 135–145)

## 2018-03-26 MED ORDER — BETHANECHOL CHLORIDE 10 MG PO TABS
10.0000 mg | ORAL_TABLET | Freq: Three times a day (TID) | ORAL | Status: DC
  Administered 2018-03-26 – 2018-04-04 (×29): 10 mg via ORAL
  Filled 2018-03-26 (×30): qty 1

## 2018-03-26 NOTE — Consult Note (Addendum)
WOC follow-up: A member of the [facility] team, Melody Farnsworth, discussed plan of care with physicial therapy via phone call.  Agreed to decrease hydrotherapy to 3 times a week instead of daily.  Refer to PT notes for assessment and percentages.  Pt will continue to receive Santyl dressing changes for enzymatic debridement daily to assist with removal of nonviable tissue, and WOC team will continue to assess wound appearance weekly.  Julien Girt MSN, RN, [place], [place], [place]

## 2018-03-26 NOTE — Progress Notes (Addendum)
Physical Therapy Weekly Progress Note  Patient Details  Name: Maria Avila MRN: [id number] Date of Birth: November 25, 1950  Beginning of progress report period: March 18, 2018 End of progress report period: March 26, 2018  Today's Date: 03/26/2018 PT Individual Time: 1105-1202 PT Individual Time Calculation (min): 57 min   Patient has met 2 of 4 short term goals.  Pt is making fair progress, as she currently can complete squat pivot transfers with +1-2 assist. Pt has engaged in activities focusing on motor planning and NMR, as well as sitting and standing balance and tolerance.   Goals are to be downgraded to min/mod assist transfers and w/c level for mobility upon d/c with therapist educating pt's husband on this.   Pt would benefit from continued skilled PT treatment to focus on bed mobility, transfers, sitting/standing balance, progress gait as able, w/c mobility, and for ongoing pt/family education.  Pt has been provided a TIS w/c with roho cushion to prevent further skin breakdown while pt sits in w/c. Depending upon wound healing, pt may need specialty cushion and/or w/c upon d/c. Pt's husband has been educated on boosting pt in TIS w/c when pt is up.  Patient continues to demonstrate the following deficits muscle weakness, decreased cardiorespiratoy endurance, ataxia, decreased coordination and decreased motor planning, decreased attention, decreased awareness, decreased problem solving, decreased safety awareness, decreased memory and delayed processing, and decreased sitting balance, decreased standing balance, decreased postural control, hemiplegia and decreased balance strategies and therefore will continue to benefit from skilled PT intervention to increase functional independence with mobility.  Patient progressing toward long term goals..  Plan of care revisions: goals downgraded to min/mod assist overall for transfers, min assist w/c propulsion, gait with therapy only at this  point.  PT Short Term Goals Week 2:  PT Short Term Goal 1 (Week 2): Pt will consistently complete bed<>w/c transfers with max assist +1. PT Short Term Goal 2 (Week 2): Pt will ambulate 25 ft with LRAD +2 assist. PT Short Term Goal 3 (Week 2): Pt will complete sit<>supine with min assist. PT Short Term Goal 4 (Week 2): Pt will propel w/c 50 ft with mod assist.  Skilled Therapeutic Interventions/Progress Updates:  Pt received in bed reporting need to use restroom. Pt performs rolling L<>R with min assist with max multimodal cuing for LE/UE placement on bed rails to assist with rolling, and mod assist for supine>sit. Pt transferred to standing in stedy with +2 assist and transported into bathroom. Pt with incontinent BM in bed and continent BM on toilet, as well as continent void. Pt able to maintain static sitting balance with SBA. Therapist provides total assist for donning shirt and peri hygiene. Pt assisted back to bed with same method to allow therapist and RN to perform peri hygiene and changing dressing on buttocks. Therapist dons pants total assist bed level. Educated pt's husband [name]) on need to be able to provide peri care, dressing, and toileting once pt discharges home, but also introduced potential idea of SNF if family is unable to provide necessary level of care. Therapist provided [name] with home measurement sheet and handout regarding how to build a ramp, and educated him on pt's need and benefits of installing a ramp but [name] seems unsure if this will be possible. Pt assisted to w/c via stedy lift and transported to gym via w/c dependent assist. Pt transfers w/c<>mat table with mod assist via squat pivot with multimodal cuing and assist for hand placement with pt able to assist  with pivoting portion and slightly improved motor planning on this date. Pt transferred sit<>stand with +2 assist via three muskateers and attempted to perform marching in place and taking step but pt unable to  bear weight and coordinate steps, with BLE significantly externally rotating, on this date. At end of session pt left sitting reclined in w/c with chair alarm donned & husband present to supervise.   No c/o pain reported during session.   Therapy Documentation Precautions:  Precautions Precautions: Fall Precaution Comments: apraxic, somewhat impulsive Restrictions Weight Bearing Restrictions: No   Therapy/Group: Individual Therapy  [name] 03/26/2018, 12:16 PM

## 2018-03-26 NOTE — Progress Notes (Signed)
Physical Therapy Wound Treatment Patient Details  Name: Maria Avila MRN: [id number] Date of Birth: 03-24-1951  Today's Date: 03/26/2018 PT Individual Time: 0913-0936 PT Individual Time Calculation (min): 23 min   Subjective  Subjective: "Not right now," pt stated when asked if she was having any pain. Patient and Family Stated Goals: Pt not stated Prior Treatments: Dressing changes  Pain Score:    Wound Assessment  Pressure Injury 03/22/18 Unstageable - Full thickness tissue loss in which the base of the ulcer is covered by slough (yellow, tan, gray, green or brown) and/or eschar (tan, brown or black) in the wound bed. Patient had MASD POA, patient continues to be  (Active)  Dressing Type Gauze (Comment);Foam;Moist to [employer] (skin prep) 03/26/2018  9:13 AM  Dressing Changed;Clean;Dry;Intact 03/26/2018  9:13 AM  Dressing Change Frequency Daily 03/26/2018  9:13 AM  State of Healing Eschar 03/26/2018  9:13 AM  Site / Wound Assessment Pink;Red;Yellow 03/26/2018  9:13 AM  % Wound base Red or Granulating 40% 03/26/2018  9:13 AM  % Wound base Yellow/Fibrinous Exudate 60% 03/26/2018  9:13 AM  % Wound base Black/Eschar 0% 03/26/2018  9:13 AM  % Wound base Other/Granulation Tissue (Comment) 0% 03/26/2018  9:13 AM  Peri-wound Assessment Excoriated;Maceration 03/26/2018  9:13 AM  Wound Length (cm) 4.5 cm 03/23/2018  9:12 AM  Wound Width (cm) 9 cm 03/23/2018  9:12 AM  Wound Depth (cm) 0.1 cm 03/23/2018  9:12 AM  Wound Surface Area (cm^2) 40.5 cm^2 03/23/2018  9:12 AM  Wound Volume (cm^3) 4.05 cm^3 03/23/2018  9:12 AM  Margins Unattached edges (unapproximated) 03/26/2018  9:13 AM  Drainage Amount Minimal 03/26/2018  9:13 AM  Drainage Description Serous 03/26/2018  9:13 AM  Treatment Debridement (Selective);Hydrotherapy (Pulse lavage);Packing (Saline gauze) 03/26/2018  9:13 AM   Santyl applied to wound bed prior to applying dressing.    Hydrotherapy Pulsed lavage therapy - wound  location: sacrum Pulsed Lavage with Suction (psi): 8 psi(8-12) Pulsed Lavage with Suction - Normal Saline Used: 1000 mL Pulsed Lavage Tip: Tip with splash shield Selective Debridement Selective Debridement - Location: sacrum Selective Debridement - Tools Used: Forceps;Scissors Selective Debridement - Tissue Removed: yellow necrotic tissue   Wound Assessment and Plan  Wound Therapy - Assess/Plan/Recommendations Wound Therapy - Clinical Statement: Progressing with removal of necrotic tissue. Much of the remaining necrotic tissue adhered to viable tissue making selective debridement difficult. Recommend reducing hydrotherapy to 3x/wk with nursing to perform dressing changes at other times.  Wound Therapy - Functional Problem List: decr mobility and sitting tolerance Factors Delaying/Impairing Wound Healing: Incontinence;Immobility;Multiple medical problems;Polypharmacy;Diabetes Mellitus Hydrotherapy Plan: Debridement;Dressing change;Patient/family education;Pulsatile lavage with suction Wound Therapy - Frequency: 3X / week(recommending) Wound Therapy - Follow Up Recommendations: Home health RN Wound Plan: See above  Wound Therapy Goals- Improve the function of patient's integumentary system by progressing the wound(s) through the phases of wound healing (inflammation - proliferation - remodeling) by: Decrease Necrotic Tissue to: 40 Decrease Necrotic Tissue - Progress: Goal set today Increase Granulation Tissue to: 60 Increase Granulation Tissue - Progress: Goal set today  Goals will be updated until maximal potential achieved or discharge criteria met.  Discharge criteria: when goals achieved, discharge from hospital, MD decision/surgical intervention, no progress towards goals, refusal/missing three consecutive treatments without notification or medical reason.  GP     Shary Decamp [place] 03/26/2018, 10:12 AM [place] Pager [phone] Office  [phone]

## 2018-03-26 NOTE — Progress Notes (Signed)
Speech Language Pathology Daily Session Note  Patient Details  Name: Maria Avila MRN: [id number] Date of Birth: 1950/06/24  Today's Date: 03/26/2018   Skilled treatment session #1 SLP Individual Time: 0815-0900 SLP Individual Time Calculation (min): 45 min   Skilled treatment session #2 SLP Individual Time: 1000-1045 SLP Individual Time Calculation (min): 45 min  Short Term Goals: Week 2: SLP Short Term Goal 1 (Week 2): Pt will utilize compensatory memory aids to recall orientation information with Mod A cues.  SLP Short Term Goal 2 (Week 2): Pt will sustain attention to basic task for ~ 15 minutes with Mod A cues.  SLP Short Term Goal 3 (Week 2): Pt will complete basic problem solving tasks with Mod A cues.  SLP Short Term Goal 4 (Week 2): Pt will demonstrate intellectual awareness by listing 2 physical and 2 cognitive deficits related to acute illness with Mod A cues.   Skilled Therapeutic Interventions:  Skilled treatment session #1 focused on cognition goals. SLP received pt in bed finishing breakfast tray. SLP facilitiated session by providing Max A cues to utilize calendar for re-orientation to month, day, date and year, Despite Max A review, pt was not able to repeat information. SLP further facilitated session by providing Max A to Mod A for sustained attention to basic task of placing numbers into calendar slots. Specifically, pt required cues to maintain task until completion.   Skilled treatment session #2 focused on cognition goals. SLP recieved pt in bed with increased fatigue. SLP facilitated alertness by repositioning pt. Pt required Mod A cues to maintain alertness throughout session. SLP further facilitated session by providing Mod A to sort blocks into color categories. SLP left pt semi-reclined in bed wtih all needs within reach.         Pain Pain Assessment Pain Scale: 0-10 Pain Score: 0-No pain  Therapy/Group: Individual Therapy  Aleeha Boline 03/26/2018,  10:20 AM

## 2018-03-26 NOTE — Progress Notes (Signed)
Occupational Therapy Session Note  Patient Details  Name: Maria Avila MRN: [id number] Date of Birth: [birth year]  Today's Date: 03/26/2018 OT Individual Time: 8527-7824 OT Individual Time Calculation (min): 58 min    Short Term Goals: Week 2:  OT Short Term Goal 1 (Week 2): Pt will don pants with mod assist at sit > stand level OT Short Term Goal 2 (Week 2): Pt will complete bathing with mod assist at sit > stand level OT Short Term Goal 3 (Week 2): Pt will complete toilet transfer with max assist   Skilled Therapeutic Interventions/Progress Updates:    Treatment session with focus on functional mobility with anterior weight shift and sit > stand.  Pt received supine in bed willing to participate in therapy session.  Completed bed mobility with mod assist and squat pivot transfer to Rt with max assist and max multimodal cues for anterior weight shift and sequencing of transfer.  Pt required +2 squat pivot to therapy mat due to increased distractions and fatigue.  Engaged in anterior weight shift with reaching for objects on mirror to provide additional visual feedback for sitting posture and balance as pt tends to lean backwards.  Progressed to reaching towards floor at knee distance to further facilitate anterior weight shift as needed to complete sit > stand and progress towards LB dressing seated on EOB.  Pt even demonstrating ability to reach for items on Rt with RUE when reaching forward.  Engaged in sit > stand with blocking at BLE to maintain positioning on floor while facilitating anterior weight shift, +2 for sit > stand with 2nd person assisting in blocking BLE.  Pt completed sit > stand x5, incorporated reaching for item on mirror to further facilitate upright standing.  Squat pivot to Lt mod assist.  Pt left reclined in TIS with seat belt alarm on and husband present for supervision.  Therapy Documentation Precautions:  Precautions Precautions: Fall Precaution Comments: apraxic,  somewhat impulsive Restrictions Weight Bearing Restrictions: No Pain:  Pt with no c/o pain   Therapy/Group: Individual Therapy  Simonne Come 03/26/2018, 2:39 PM

## 2018-03-26 NOTE — Progress Notes (Signed)
[facility] PHYSICAL MEDICINE & REHABILITATION PROGRESS NOTE   Subjective/Complaints:  Eating breakfast, intake improving   ROS: Unreliable due to cognition, no resp distress  Objective:   No results found. Recent Labs    03/26/18 0424  WBC 7.2  HGB 7.9*  HCT 25.9*  PLT 219   Recent Labs    03/26/18 0424  NA 141  K 3.9  CL 113*  CO2 24  GLUCOSE 79  BUN 17  CREATININE 0.74  CALCIUM 8.5*    Intake/Output Summary (Last 24 hours) at 03/26/2018 0824 Last data filed at 03/26/2018 0550 Gross per 24 hour  Intake 357.75 ml  Output 1650 ml  Net -1292.25 ml     Physical Exam: Vital Signs Blood pressure 107/75, pulse (!) 101, temperature 97.6 F (36.4 C), temperature source Oral, resp. rate 18, weight 51.6 kg, SpO2 100 %. Constitutional: No distress . Vital signs reviewed. HENT: Normocephalic.  Atraumatic. Eyes: EOMI. No discharge. Cardiovascular: RRR.  No JVD. Respiratory: crackles at Left base no resp distress, no wheezes GI: BS +. Non-distended. Musculoskeletal: She exhibits bilateral lower extremity edema Neurological: She is alert and oriented x1.  Delayed processing.  Motor: 4/5 bue and ble  Skin: Sacral wound dressed. Left CT site sutures removed  Psychiatric: Restless.  Confused.  Assessment/Plan: 1. 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  Physiatrist is providing close team supervision and 24 hour management of active medical problems listed below.  Physiatrist and rehab team continue to assess barriers to discharge/monitor patient progress toward functional and medical goals  Care Tool:  Bathing              Bathing assist Assist Level: 2 Helpers(per reports)     Upper Body Dressing/Undressing Upper body dressing   What is the patient wearing?: Pull over shirt    Upper body assist Assist Level: Moderate Assistance -  Patient 50 - 74%    Lower Body Dressing/Undressing Lower body dressing      What is the patient wearing?: Pants     Lower body assist Assist for lower body dressing: Maximal Assistance - Patient 25 - 49%     Toileting Toileting    Toileting assist Assist for toileting: 2 Helpers     Transfers Chair/bed transfer  Transfers assist     Chair/bed transfer assist level: Total Assistance - Patient < 25%(stand pivot)     Locomotion Ambulation   Ambulation assist   Ambulation activity did not occur: Safety/medical concerns(apraxia)  Assist level: (3 muskateers + w/c follow)   Max distance: 5 ft    Walk 10 feet activity   Assist           Walk 50 feet activity   Assist           Walk 150 feet activity   Assist           Walk 10 feet on uneven surface  activity   Assist           Wheelchair     Assist Will patient use wheelchair at discharge?: Yes Type of Wheelchair: Manual    Wheelchair assist level: Maximal Assistance - Patient 25 - 49% Max wheelchair distance: 15    Wheelchair 50 feet with 2 turns activity    Assist    Wheelchair 50 feet with 2 turns activity did not occur: Safety/medical concerns(fatigue)   Assist Level: Minimal Assistance -  Patient > 75%   Wheelchair 150 feet activity     Assist Wheelchair 150 feet activity did not occur: Safety/medical concerns(fatigue)         Medical Problem List and Plan: 1.  Limitations in mobility, self-care, endurance secondary to L>R infarcts and multiple medical issues.  -Continue CIR Tent D/C 12/6 min/mod A level  Repeat head CT no hemorrhagic conversion  AMS with witnessed short seizure, EEG shows generalized slowing.  Also starting po Keppra monitoring response to new medication this point it does not appear that this is causing additional sedation   2.  BLE DVTs/Anticoagulation: Pharmaceutical: Other (comment)-on Eliquis 3. Pain Management: tylenol prn.  4.  Mood: LCSW to follow for evaluation and support.  5. Neuropsych: This patient is not capable of making decisions on her own behalf. 6. Skin/Wound Care: Air mattress due to sacral breakdown.   -nutrition/local skin care to chest/sacrum 7. Fluids/Electrolytes/Nutrition:  Continue dysphagia 3 thin diet. - may d/c CBG Off TNA. I~660ml po  BMP within acceptable range on 11/16-  8. Empyema: Continue IV Zosyn end date 11/21. (Levaquin stopped due to concerns of lowering seizure threshold)  -local care to CT site, Needs IS for atelectasis 9. Malnutrition/Heavy alcohol use: Poor intake with 40 lbs weight loss.   -B12 and folate levels normal 10. Diarrhea: Added probiotic and fiber. Discontinued Ensure and added prostat.  Stools still mushy with incont  Overall improved, ? GI blood loss check stool OB  -added imodium 11. Hypokalemia: Likely due to nutritional deficits as well as diarrhea.   -Potassium supplement  Repeat labs ordered  Potassium 3.5 on 11/16  Recheck on 11/22, normal 12. Leucocytosis with neutropenia: Resolved  Continue to monitor.  13.  Acute blood loss anemia  Hb 9.0 on 11/16 , 7.9 on recheck 11/22- if orthostatic will transfuse, recheck CBC on 11/24  Cont to monitor   14.  Probable post CVA seizure,Keppra, if patient becomes more lethargic we can reduce the dose at 250 mg twice a day 15.  Urinary retention,still requiring  ICP, on  Flomax, increase  urecholine LOS: 9 days A FACE TO FACE EVALUATION WAS PERFORMED  Charlett Blake 03/26/2018, 8:24 AM

## 2018-03-27 ENCOUNTER — Inpatient Hospital Stay (HOSPITAL_COMMUNITY): Payer: Self-pay | Admitting: Occupational Therapy

## 2018-03-27 ENCOUNTER — Inpatient Hospital Stay (HOSPITAL_COMMUNITY): Payer: Self-pay | Admitting: Physical Therapy

## 2018-03-27 DIAGNOSIS — I63413 Cerebral infarction due to embolism of bilateral middle cerebral arteries: Secondary | ICD-10-CM

## 2018-03-27 NOTE — Plan of Care (Signed)
  Problem: RH SAFETY Goal: RH STG ADHERE TO SAFETY PRECAUTIONS W/ASSISTANCE/DEVICE Description STG Adhere to Safety Precautions With min Assistance/Device.  Outcome: Progressing   Problem: Consults Goal: RH STROKE PATIENT EDUCATION Description See Patient Education module for education specifics  Outcome: Not Progressing Goal: Nutrition Consult-if indicated Outcome: Not Progressing   Problem: RH BOWEL ELIMINATION Goal: RH STG MANAGE BOWEL WITH ASSISTANCE Description STG Manage Bowel with mod Assistance.  Outcome: Not Progressing   Problem: RH BLADDER ELIMINATION Goal: RH STG MANAGE BLADDER WITH ASSISTANCE Description STG Manage Bladder With mod Assistance  Outcome: Not Progressing   Problem: RH SKIN INTEGRITY Goal: RH STG SKIN FREE OF INFECTION/BREAKDOWN Description Patients skin will remain free from further infection or breakdown during rehab stay with mod assist.  Outcome: Not Progressing Goal: RH STG MAINTAIN SKIN INTEGRITY WITH ASSISTANCE Description STG Maintain Skin Integrity With mod Assistance.  Outcome: Not Progressing Goal: RH STG ABLE TO PERFORM INCISION/WOUND CARE W/ASSISTANCE Description STG Able To Perform Incision/Wound Care With total Assistance from caregiver.  Outcome: Not Progressing   Problem: RH KNOWLEDGE DEFICIT Goal: RH STG INCREASE KNOWLEDGE OF HYPERTENSION Description Min assist.  Outcome: Not Progressing Goal: RH STG INCREASE KNOWLEDGE OF DYSPHAGIA/FLUID INTAKE Description Min assist  Outcome: Not Progressing Goal: RH STG INCREASE KNOWLEGDE OF HYPERLIPIDEMIA Description Min assist  Outcome: Not Progressing Goal: RH STG INCREASE KNOWLEDGE OF STROKE PROPHYLAXIS Description Min assist  Outcome: Not Progressing

## 2018-03-27 NOTE — Progress Notes (Addendum)
Physical Therapy Session Note  Patient Details  Name: Maria Avila MRN: [id number] Date of Birth: [birth year]  Today's Date: 03/27/2018 PT Individual Time: 0835-0900 PT Individual Time Calculation (min): 25 min   Short Term Goals: Week 2:  PT Short Term Goal 1 (Week 2): Pt will consistently complete bed<>w/c transfers with max assist +1. PT Short Term Goal 2 (Week 2): Pt will ambulate 25 ft with LRAD +2 assist. PT Short Term Goal 3 (Week 2): Pt will complete sit<>supine with min assist. PT Short Term Goal 4 (Week 2): Pt will propel w/c 50 ft with mod assist.  Skilled Therapeutic Interventions/Progress Updates:  Pt received in bed & agreeable to tx. No c/o pain reported. Pt's daughter Levada Dy) arrived during session & observed. Pt performed rolling L<>R with min assist for LE placement and occasional cuing to use rails; therapist donned brief total assist. Pt transferred sidelying>sitting and sit>supine with mod assist to upright trunk then transfer LE to bed respectively. Pt transferred sit>stand in stedy lift with mod assist with BUE pulling up on stedy rail. Pt able to sit EOB and in stedy lift with SBA. Pt transported to [facility] gym via [place] lift and engaged in sit<>stand transfers, standing tolerance, standing balance and dynamic sitting balance with pt requiring min<>mod assist for balance. Pt engages in dynavision task with encouragement and assistance to use RUE to press lights 2/2 impaired coordination with pt maintaining R elbow flexion and pt alternating with use of LUE. Pt always maintains 1 UE support on stedy lift. At end of session pt left in bed with 4 rails up (air mattress), call bell in reach, bed alarm set & daughter in room.   Therapy Documentation Precautions:  Precautions Precautions: Fall Precaution Comments: apraxic, somewhat impulsive Restrictions Weight Bearing Restrictions: No    Therapy/Group: Individual Therapy  [name] 03/27/2018, 9:21 AM

## 2018-03-27 NOTE — Progress Notes (Signed)
Occupational Therapy Session Note  Patient Details  Name: Maria Avila MRN: [id number] Date of Birth: 02/16/1951  Today's Date: 03/27/2018 OT Individual Time: 1045-1115 OT Individual Time Calculation (min): 30 min    Short Term Goals: Week 1:  OT Short Term Goal 1 (Week 1): Pt will complete bed mobility with max assist of one caregiver to decrease burden of care with bathing and dressing OT Short Term Goal 1 - Progress (Week 1): Met OT Short Term Goal 2 (Week 1): Pt will complete bathing with max assist  OT Short Term Goal 2 - Progress (Week 1): Met OT Short Term Goal 3 (Week 1): Pt will complete bed > w/c transfer to prepare for toilet transfers with max assist of one caregiver OT Short Term Goal 3 - Progress (Week 1): Met OT Short Term Goal 4 (Week 1): Pt will don pants with max assist OT Short Term Goal 4 - Progress (Week 1): Met  Skilled Therapeutic Interventions/Progress Updates: patient in and out of confusion and disorientation this session.   Otherwise participation as follows:   Bed mobility= max assist and extra time to process and plan.  Supine to edge of bed= max A and extra time.    EOB static sitting balance is CGA and S.    EOB dynamic sitting balance CGA to min Assist.    Donned pants with max A and sit to stand with max A.    standin balance activities and standing to supine in bed= Max assist and max cues and extra time to process the task.     Therapy Documentation Precautions:  Precautions Precautions: Fall Precaution Comments: apraxic, somewhat impulsive Restrictions Weight Bearing Restrictions: No   Pain: Pain Assessment Pain Scale: 0-10 Pain Score: 0-No pain    Therapy/Group: Individual Therapy  ,  Yeary 03/27/2018, 12:46 PM 

## 2018-03-27 NOTE — Progress Notes (Signed)
Maria Avila is a 67 y.o. female [birth year] [id number]  Subjective: Patient supine in bed without complaint or concern.  Daughter at bedside.  Neither with questions.  Objective: Vital signs in last 24 hours: Temp:  [98 F (36.7 C)-98.2 F (36.8 C)] 98.2 F (36.8 C) (11/23 0441) Pulse Rate:  [97-109] 105 (11/23 0554) Resp:  [18] 18 (11/23 0554) BP: (96-111)/(53-79) 110/75 (11/23 0554) SpO2:  [100 %] 100 % (11/23 0554) Weight:  [51.9 kg] 51.9 kg (11/23 0500) Weight change: 0.363 kg Last BM Date: 03/27/18  Intake/Output from previous day: 11/22 0701 - 11/23 0700 In: 340 [P.O.:340] Out: 800 [Urine:800]  Physical Exam General: No apparent distress   Dtr at side Lungs: Normal effort. Lungs clear to auscultation, no crackles or wheezes. Cardiovascular: Regular rate and rhythm, no edema Neurological: No new neurological deficits   Lab Results: BMET    Component Value Date/Time   NA 141 03/26/2018 0424   K 3.9 03/26/2018 0424   CL 113 (H) 03/26/2018 0424   CO2 24 03/26/2018 0424   GLUCOSE 79 03/26/2018 0424   BUN 17 03/26/2018 0424   CREATININE 0.74 03/26/2018 0424   CREATININE 0.78 07/21/2017 1131   CALCIUM 8.5 (L) 03/26/2018 0424   GFRNONAA >60 03/26/2018 0424   GFRNONAA >60 07/21/2017 1131   GFRAA >60 03/26/2018 0424   GFRAA >60 07/21/2017 1131   CBC    Component Value Date/Time   WBC 7.2 03/26/2018 0424   RBC 2.59 (L) 03/26/2018 0424   HGB 7.9 (L) 03/26/2018 0424   HGB 11.3 (L) 07/21/2017 1131   HCT 25.9 (L) 03/26/2018 0424   HCT 23.7 (L) 02/17/2018 0205   PLT 219 03/26/2018 0424   PLT 186 07/21/2017 1131   MCV 100.0 03/26/2018 0424   MCH 30.5 03/26/2018 0424   MCHC 30.5 03/26/2018 0424   RDW 17.8 (H) 03/26/2018 0424   LYMPHSABS 3.1 03/18/2018 1210   MONOABS 1.0 03/18/2018 1210   EOSABS 0.0 03/18/2018 1210   BASOSABS 0.0 03/18/2018 1210   CBG's (last 3):  No results for input(s): GLUCAP in the last 72 hours. LFT's Lab Results  Component Value Date    ALT 47 (H) 03/18/2018   AST 38 03/18/2018   ALKPHOS 142 (H) 03/18/2018   BILITOT 0.5 03/18/2018    Studies/Results: No results found.  Medications:  I have reviewed the patient's current medications. Scheduled Medications: . apixaban  5 mg Oral BID  . bethanechol  10 mg Oral TID  . collagenase   Topical Daily  . famotidine  10 mg Oral BID  . feeding supplement (PRO-STAT SUGAR FREE 64)  30 mL Oral BID  . Gerhardt's butt cream   Topical Daily  . lactobacillus  1 g Oral TID WC  . levETIRAcetam  500 mg Oral BID  . loperamide  4 mg Oral BID  . multivitamin with minerals  1 tablet Oral Daily  . polycarbophil  1,250 mg Oral BID  . potassium chloride  20 mEq Oral BID  . QUEtiapine  50 mg Oral QHS  . tamsulosin  0.4 mg Oral QPC supper  . thiamine  100 mg Oral Daily   PRN Medications: acetaminophen, bisacodyl, diphenhydrAMINE, guaiFENesin-dextromethorphan, lidocaine, ondansetron, sodium chloride flush, sodium phosphate  Assessment/Plan: Active Problems:   Cerebral embolism with cerebral infarction   Stroke due to embolism (HCC)   Acute blood loss anemia   Impulsive  1.  Bi-cerebral left greater than right infarct and multiple comorbidities.  Subsequent gait disorder and deconditioning.  Continue inpatient rehab therapies for physical therapy and Occupational Therapy as ordered and supportive care for strengthening. 2. seizures.  Initiation of Keppra during this hospitalization related to above (post CVA). 3. Skin care.  Continue you ongoing attention and monitoring for sacral area.  Continue out of bed as planned with therapy. 4.  Prior empyema completing antibiotics on 1121.  Local care to site of prior chest tube and encourage incentive spirometry as ordered. 5.  hypokalemia.  Related to ongoing GI loss.  Continue replenishment and recheck as needed, last lab having been normal. 6.  Acute on chronic blood loss anemia Next recheck hemoglobin scheduled for this week with plans for  transfusion if symptomatic or orthostatic.  No clinical evidence of ongoing loss. 7.  Persisting urinary retention.  On Flomax and Urecholine.  Continue monitoring and I/O cath as needed   Length of stay, days: 10   A. Asa Lente, MD 03/27/2018, 11:49 AM

## 2018-03-28 ENCOUNTER — Inpatient Hospital Stay (HOSPITAL_COMMUNITY): Payer: Self-pay | Admitting: Physical Therapy

## 2018-03-28 NOTE — Plan of Care (Signed)
  Problem: Consults Goal: RH STROKE PATIENT EDUCATION Description See Patient Education module for education specifics  Outcome: Progressing Goal: Nutrition Consult-if indicated Outcome: Progressing   Problem: RH BOWEL ELIMINATION Goal: RH STG MANAGE BOWEL WITH ASSISTANCE Description STG Manage Bowel with mod Assistance.  Outcome: Progressing   Problem: RH BLADDER ELIMINATION Goal: RH STG MANAGE BLADDER WITH ASSISTANCE Description STG Manage Bladder With mod Assistance  Outcome: Progressing   Problem: RH SKIN INTEGRITY Goal: RH STG SKIN FREE OF INFECTION/BREAKDOWN Description Patients skin will remain free from further infection or breakdown during rehab stay with mod assist.  Outcome: Progressing Goal: RH STG MAINTAIN SKIN INTEGRITY WITH ASSISTANCE Description STG Maintain Skin Integrity With mod Assistance.  Outcome: Progressing Goal: RH STG ABLE TO PERFORM INCISION/WOUND CARE W/ASSISTANCE Description STG Able To Perform Incision/Wound Care With total Assistance from caregiver.  Outcome: Progressing   Problem: RH SAFETY Goal: RH STG ADHERE TO SAFETY PRECAUTIONS W/ASSISTANCE/DEVICE Description STG Adhere to Safety Precautions With min Assistance/Device.  Outcome: Progressing   Problem: RH KNOWLEDGE DEFICIT Goal: RH STG INCREASE KNOWLEDGE OF HYPERTENSION Description Min assist.  Outcome: Progressing Goal: RH STG INCREASE KNOWLEDGE OF DYSPHAGIA/FLUID INTAKE Description Min assist  Outcome: Progressing Goal: RH STG INCREASE KNOWLEGDE OF HYPERLIPIDEMIA Description Min assist  Outcome: Progressing Goal: RH STG INCREASE KNOWLEDGE OF STROKE PROPHYLAXIS Description Min assist  Outcome: Progressing

## 2018-03-28 NOTE — Progress Notes (Signed)
Maria Avila is a 67 y.o. female 05-23-1950 [id number]  Subjective: Patient sitting upright in bed with spouse (?) and son in room.  Spouse indicates concern about ongoing skin care regarding sacrum and fabrics on bed or type of disposable underwear being used  Objective: Vital signs in last 24 hours: Temp:  [97.8 F (36.6 C)-98.2 F (36.8 C)] 97.8 F (36.6 C) (11/24 0405) Pulse Rate:  [92-105] 92 (11/24 0407) Resp:  [16-18] 16 (11/24 0405) BP: (96-124)/(65-83) 117/75 (11/24 0407) SpO2:  [95 %-100 %] 99 % (11/24 0407) Weight:  [52 kg] 52 kg (11/24 0500) Weight change: 0.063 kg Last BM Date: 03/28/18  Intake/Output from previous day: 11/23 0701 - 11/24 0700 In: 30 [P.O.:560; I.V.:10] Out: 1350 [Urine:1350]  Physical Exam General: No apparent distress   family at bedside Lungs: Normal effort. Lungs clear to auscultation, no crackles or wheezes. Cardiovascular: Regular rate and rhythm, no edema Neurological: No new neurological deficits   Lab Results: BMET    Component Value Date/Time   NA 141 03/26/2018 0424   K 3.9 03/26/2018 0424   CL 113 (H) 03/26/2018 0424   CO2 24 03/26/2018 0424   GLUCOSE 79 03/26/2018 0424   BUN 17 03/26/2018 0424   CREATININE 0.74 03/26/2018 0424   CREATININE 0.78 07/21/2017 1131   CALCIUM 8.5 (L) 03/26/2018 0424   GFRNONAA >60 03/26/2018 0424   GFRNONAA >60 07/21/2017 1131   GFRAA >60 03/26/2018 0424   GFRAA >60 07/21/2017 1131   CBC    Component Value Date/Time   WBC 7.2 03/26/2018 0424   RBC 2.59 (L) 03/26/2018 0424   HGB 7.9 (L) 03/26/2018 0424   HGB 11.3 (L) 07/21/2017 1131   HCT 25.9 (L) 03/26/2018 0424   HCT 23.7 (L) 02/17/2018 0205   PLT 219 03/26/2018 0424   PLT 186 07/21/2017 1131   MCV 100.0 03/26/2018 0424   MCH 30.5 03/26/2018 0424   MCHC 30.5 03/26/2018 0424   RDW 17.8 (H) 03/26/2018 0424   LYMPHSABS 3.1 03/18/2018 1210   MONOABS 1.0 03/18/2018 1210   EOSABS 0.0 03/18/2018 1210   BASOSABS 0.0 03/18/2018 1210    CBG's (last 3):  No results for input(s): GLUCAP in the last 72 hours. LFT's Lab Results  Component Value Date   ALT 47 (H) 03/18/2018   AST 38 03/18/2018   ALKPHOS 142 (H) 03/18/2018   BILITOT 0.5 03/18/2018    Studies/Results: No results found.  Medications:  I have reviewed the patient's current medications. Scheduled Medications: . apixaban  5 mg Oral BID  . bethanechol  10 mg Oral TID  . collagenase   Topical Daily  . famotidine  10 mg Oral BID  . feeding supplement (PRO-STAT SUGAR FREE 64)  30 mL Oral BID  . Gerhardt's butt cream   Topical Daily  . lactobacillus  1 g Oral TID WC  . levETIRAcetam  500 mg Oral BID  . loperamide  4 mg Oral BID  . multivitamin with minerals  1 tablet Oral Daily  . polycarbophil  1,250 mg Oral BID  . potassium chloride  20 mEq Oral BID  . QUEtiapine  50 mg Oral QHS  . tamsulosin  0.4 mg Oral QPC supper  . thiamine  100 mg Oral Daily   PRN Medications: acetaminophen, bisacodyl, diphenhydrAMINE, guaiFENesin-dextromethorphan, lidocaine, ondansetron, sodium chloride flush, sodium phosphate  Assessment/Plan: Active Problems:   Cerebral embolism with cerebral infarction   Stroke due to embolism (HCC)   Acute blood loss anemia  Impulsive  1.  Bi-cerebral left greater than right infarct and multiple comorbidities.  Subsequent gait disorder and deconditioning.  Continue inpatient rehab therapies for physical therapy and Occupational Therapy as ordered and supportive care for strengthening. 2. seizures.  Initiation of Keppra during this hospitalization related to above (post CVA). 3. Skin care.  Continue you ongoing attention and monitoring for sacral area.  Continue out of bed as planned with therapy. 4.  Prior empyema completing antibiotics on 1121.  Local care to site of prior chest tube and encourage incentive spirometry as ordered. 5.  hypokalemia.  Related to ongoing GI loss.  Continue replenishment and recheck as needed, last lab  having been normal. 6.  Acute on chronic blood loss anemia. Next recheck hemoglobin scheduled for this week with plans for transfusion if symptomatic or orthostatic.  No clinical evidence of ongoing loss. 7.  Persisting urinary retention.  On Flomax and Urecholine.  Continue monitoring and I/O cath as needed   Length of stay, days: 11  Carliss Porcaro A. Asa Lente, MD 03/28/2018, 10:08 AM

## 2018-03-28 NOTE — Progress Notes (Signed)
Physical Therapy Session Note  Patient Details  Name: Maria Avila MRN: [id number] Date of Birth: 1950-10-02  Today's Date: 03/28/2018 PT Individual Time: 1112-1152 PT Individual Time Calculation (min): 40 min   Short Term Goals: Week 2:  PT Short Term Goal 1 (Week 2): Pt will consistently complete bed<>w/c transfers with max assist +1. PT Short Term Goal 2 (Week 2): Pt will ambulate 25 ft with LRAD +2 assist. PT Short Term Goal 3 (Week 2): Pt will complete sit<>supine with min assist. PT Short Term Goal 4 (Week 2): Pt will propel w/c 50 ft with mod assist.  Skilled Therapeutic Interventions/Progress Updates:    pt in bed agreeable to therapy.  Pt performs sit <> stand to don pants with mod A to stand, total A for clothing management.  Stand pivot transfer to w/c with mod A.  Standing wt shifts during dance group for LE and trunk strengthening with mod A to stand, mod/max A for wt shifts in standing.  Pt then with incontinence of bowel.  Pt performed sit <> stand in stedy multiple attempts for hygiene, toileting and clothing management with pt able to stand 2-3 minutes before fatigue. Pt left with nursing present for dressing changes.  Therapy Documentation Precautions:  Precautions Precautions: Fall Precaution Comments: apraxic, somewhat impulsive Restrictions Weight Bearing Restrictions: No Pain:  no c/o pain   Therapy/Group: Individual Therapy  DONAWERTH,KAREN 03/28/2018, 1:48 PM

## 2018-03-29 ENCOUNTER — Inpatient Hospital Stay (HOSPITAL_COMMUNITY): Payer: Self-pay | Admitting: Physical Therapy

## 2018-03-29 ENCOUNTER — Inpatient Hospital Stay (HOSPITAL_COMMUNITY): Payer: Self-pay

## 2018-03-29 ENCOUNTER — Inpatient Hospital Stay (HOSPITAL_COMMUNITY): Payer: Self-pay | Admitting: Occupational Therapy

## 2018-03-29 LAB — CBC
HCT: 26.9 % — ABNORMAL LOW (ref 36.0–46.0)
HEMOGLOBIN: 8.3 g/dL — AB (ref 12.0–15.0)
MCH: 31.2 pg (ref 26.0–34.0)
MCHC: 30.9 g/dL (ref 30.0–36.0)
MCV: 101.1 fL — ABNORMAL HIGH (ref 80.0–100.0)
Platelets: 224 10*3/uL (ref 150–400)
RBC: 2.66 MIL/uL — ABNORMAL LOW (ref 3.87–5.11)
RDW: 17.2 % — AB (ref 11.5–15.5)
WBC: 9.3 10*3/uL (ref 4.0–10.5)
nRBC: 0 % (ref 0.0–0.2)

## 2018-03-29 NOTE — Consult Note (Signed)
[place] Nurse wound follow up Patient receiving care in [facility] 4W14.  Hydrotherapy to the sacral wound being performed MWF by PT.  Wound is cleaning up nicely.  PT is in agreement to continue this therapy as currently ordered.  Patient tolerating well.  Please see PT notes from today for wound specifics. Val Riles, RN, MSN, CWOCN, CNS-BC, pager [phone]

## 2018-03-29 NOTE — Progress Notes (Signed)
Occupational Therapy Session Note  Patient Details  Name: Maria Avila MRN: [id number] Date of Birth: November 17, 1950  Today's Date: 03/29/2018 OT Individual Time: 3005-1102 OT Individual Time Calculation (min): 54 min    Short Term Goals: Week 2:  OT Short Term Goal 1 (Week 2): Pt will don pants with mod assist at sit > stand level OT Short Term Goal 2 (Week 2): Pt will complete bathing with mod assist at sit > stand level OT Short Term Goal 3 (Week 2): Pt will complete toilet transfer with max assist   Skilled Therapeutic Interventions/Progress Updates:    Treatment session with focus on bed mobility, sitting balance, and functional transfers.  Pt received upright in bed finishing breakfast.  Pt utilized RUE as gross assist during self-feeding.  Deferred LB bathing/dressing as pt scheduled for hydrotherapy immediately following this therapy session.  Engaged in bed mobility with min-mod assist for rolling with focus on increased initiation and sequencing steps of rolling.  Sidelying to sitting at EOB with mod assist.  Constant min assist for sitting balance at EOB due to air mattress with inability to get feet flat on floor (whether inflated or deflated).  Pt donned shirt this session seated EOB with min assist.  Completed squat pivot transfer to Lt with max assist +2 due to pt demonstrating decreased ability to weight shift and initiate transfer this session.  Pt noted to be incontinent of bowel, therefore engaged in hygiene at sit > stand level in [place] with focus on anterior weight shift and upright standing balance while 2nd person completed hygiene.    Returned to supine at end of session, again with focus on bed mobility and recall of technique from beginning of session.  Pt left in care of nurse tech to complete I/O cath.  Therapy Documentation Precautions:  Precautions Precautions: Fall Precaution Comments: apraxic, somewhat impulsive Restrictions Weight Bearing Restrictions:  No Pain:   Pt with no c/o pain  Therapy/Group: Individual Therapy  Simonne Come 03/29/2018, 8:59 AM

## 2018-03-29 NOTE — Progress Notes (Signed)
Physical Therapy Wound Treatment Patient Details  Name: Dunya E Martinique MRN: [id number] Date of Birth: [birth year]  Today's Date: 03/29/2018 PT Individual Time: 0921-0955 PT Individual Time Calculation (min): 34 min   Subjective  Subjective: Pt reports she is doing okay Patient and Family Stated Goals: Pt not stated Prior Treatments: Dressing changes  Pain Score: Pt denied pain    Wound Assessment  Pressure Injury 03/22/18 Unstageable - Full thickness tissue loss in which the base of the ulcer is covered by slough (yellow, tan, gray, green or brown) and/or eschar (tan, brown or black) in the wound bed. Patient had MASD POA, patient continues to be  (Active)  Wound Image   03/29/2018  9:00 AM  Dressing Type Gauze (Comment);Foam;Moist to [employer] (skin prep) 03/29/2018  9:00 AM  Dressing Changed;Clean;Dry;Intact 03/29/2018  9:00 AM  Dressing Change Frequency Daily 03/29/2018  9:00 AM  State of Healing Eschar 03/29/2018  9:00 AM  Site / Wound Assessment Pink;Red;Yellow 03/29/2018  9:00 AM  % Wound base Red or Granulating 40% 03/29/2018  9:00 AM  % Wound base Yellow/Fibrinous Exudate 60% 03/29/2018  9:00 AM  % Wound base Black/Eschar 0% 03/29/2018  9:00 AM  % Wound base Other/Granulation Tissue (Comment) 0% 03/29/2018  9:00 AM  Peri-wound Assessment Excoriated;Maceration 03/29/2018  9:00 AM  Wound Length (cm) 5.5 cm 03/29/2018  9:00 AM  Wound Width (cm) 8 cm 03/29/2018  9:00 AM  Wound Depth (cm) 1.5 cm 03/29/2018  9:00 AM  Wound Surface Area (cm^2) 44 cm^2 03/29/2018  9:00 AM  Wound Volume (cm^3) 66 cm^3 03/29/2018  9:00 AM  Undermining (cm) 1 to 1.5 cm from 7 o'clock to 2 o'clock  03/29/2018  9:00 AM  Margins Unattached edges (unapproximated) 03/29/2018  9:00 AM  Drainage Amount Minimal 03/29/2018  9:00 AM  Drainage Description Serous 03/29/2018  9:00 AM  Treatment Debridement (Selective);Hydrotherapy (Pulse lavage);Packing (Saline gauze) 03/29/2018  9:00 AM   Santyl applied  to wound bed prior to applying dressing.    Hydrotherapy Pulsed lavage therapy - wound location: sacrum Pulsed Lavage with Suction (psi): 8 psi(8-12) Pulsed Lavage with Suction - Normal Saline Used: 1000 mL Pulsed Lavage Tip: Tip with splash shield Selective Debridement Selective Debridement - Location: sacrum Selective Debridement - Tools Used: Forceps;Scissors Selective Debridement - Tissue Removed: yellow necrotic tissue   Wound Assessment and Plan  Wound Therapy - Assess/Plan/Recommendations Wound Therapy - Clinical Statement: Continuing to progress with removal of necrotic tissue. Performing hydrotherapy 3x/wk now.  Wound Therapy - Functional Problem List: decr mobility and sitting tolerance Factors Delaying/Impairing Wound Healing: Incontinence;Immobility;Multiple medical problems;Polypharmacy;Diabetes Mellitus Hydrotherapy Plan: Debridement;Dressing change;Patient/family education;Pulsatile lavage with suction Wound Therapy - Frequency: 3X / week Wound Therapy - Follow Up Recommendations: Home health RN Wound Plan: See above  Wound Therapy Goals- Improve the function of patient's integumentary system by progressing the wound(s) through the phases of wound healing (inflammation - proliferation - remodeling) by: Decrease Necrotic Tissue to: 40 Decrease Necrotic Tissue - Progress: Progressing toward goal Increase Granulation Tissue to: 60 Increase Granulation Tissue - Progress: Progressing toward goal  Goals will be updated until maximal potential achieved or discharge criteria met.  Discharge criteria: when goals achieved, discharge from hospital, MD decision/surgical intervention, no progress towards goals, refusal/missing three consecutive treatments without notification or medical reason.  GP     Shary Decamp Maycok 03/29/2018, 10:15 AM Margarette Vannatter [place] Pager [phone] Office [phone]

## 2018-03-29 NOTE — Progress Notes (Signed)
[place] PHYSICAL MEDICINE & REHABILITATION PROGRESS NOTE   Subjective/Complaints:  Felt better oriented to person and place not time, no pain in buttocks  ROS: Unreliable due to cognition, no resp distress  Objective:   No results found. Recent Labs    03/29/18 0409  WBC 9.3  HGB 8.3*  HCT 26.9*  PLT 224   No results for input(s): NA, K, CL, CO2, GLUCOSE, BUN, CREATININE, CALCIUM in the last 72 hours.  Intake/Output Summary (Last 24 hours) at 03/29/2018 0819 Last data filed at 03/29/2018 0132 Gross per 24 hour  Intake 660 ml  Output 600 ml  Net 60 ml     Physical Exam: Vital Signs Blood pressure 120/80, pulse 100, temperature 98.2 F (36.8 C), temperature source Oral, resp. rate 16, weight 52 kg, SpO2 100 %. Constitutional: No distress . Vital signs reviewed. HENT: Normocephalic.  Atraumatic. Eyes: EOMI. No discharge. Cardiovascular: RRR.  No JVD. Respiratory: crackles at Left base no resp distress, no wheezes GI: BS +. Non-distended. Musculoskeletal: She exhibits bilateral lower extremity edema Neurological: She is alert and oriented x1.  Delayed processing.  Motor: 4/5 bue and ble  Skin: Sacral wound dressed. Left CT site sutures removed  Psychiatric: Restless.  Confused.  Assessment/Plan: 1. 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  Physiatrist is providing close team supervision and 24 hour management of active medical problems listed below.  Physiatrist and rehab team continue to assess barriers to discharge/monitor patient progress toward functional and medical goals  Care Tool:  Bathing              Bathing assist Assist Level: 2 Helpers(per reports)     Upper Body Dressing/Undressing Upper body dressing   What is the patient wearing?: Pull over shirt    Upper body assist Assist Level: Moderate Assistance - Patient 50  - 74%    Lower Body Dressing/Undressing Lower body dressing      What is the patient wearing?: Pants     Lower body assist Assist for lower body dressing: Maximal Assistance - Patient 25 - 49%     Toileting Toileting    Toileting assist Assist for toileting: 2 Helpers     Transfers Chair/bed transfer  Transfers assist     Chair/bed transfer assist level: Maximal Assistance - Patient 25 - 49%     Locomotion Ambulation   Ambulation assist   Ambulation activity did not occur: Safety/medical concerns(apraxia)  Assist level: (3 muskateers + w/c follow)   Max distance: 5 ft    Walk 10 feet activity   Assist           Walk 50 feet activity   Assist           Walk 150 feet activity   Assist           Walk 10 feet on uneven surface  activity   Assist           Wheelchair     Assist Will patient use wheelchair at discharge?: Yes Type of Wheelchair: Manual    Wheelchair assist level: Maximal Assistance - Patient 25 - 49% Max wheelchair distance: 15    Wheelchair 50 feet with 2 turns activity    Assist    Wheelchair 50 feet with 2 turns activity did not occur: Safety/medical concerns(fatigue)   Assist Level: Minimal Assistance - Patient > 75%   Wheelchair 150 feet activity  Assist Wheelchair 150 feet activity did not occur: Safety/medical concerns(fatigue)         Medical Problem List and Plan: 1.  Limitations in mobility, self-care, endurance secondary to L>R infarcts and multiple medical issues.  -Continue CIR Tent D/C 12/6 min/mod A level  Repeat head CT no hemorrhagic conversion  AMS with witnessed short seizure, EEG shows generalized slowing.  Also starting po Keppra monitoring response to new medication this point it does not appear that this is causing additional sedation   2.  BLE DVTs/Anticoagulation: Pharmaceutical: Other (comment)-on Eliquis 3. Pain Management: tylenol prn.  4. Mood: LCSW to  follow for evaluation and support.  5. Neuropsych: This patient is not capable of making decisions on her own behalf. 6. Skin/Wound Care: Air mattress due to sacral breakdown.   -nutrition/local skin care to chest/sacrum 7. Fluids/Electrolytes/Nutrition:  Continue dysphagia 3 thin diet. - may d/c CBG Off TNA. I~670ml po  BMP within acceptable range on 11/16-  8. Empyema: Continue IV Zosyn end date 11/21. (Levaquin stopped due to concerns of lowering seizure threshold)  -local care to CT site, Needs IS for atelectasis 9. Malnutrition/Heavy alcohol use: Poor intake with 40 lbs weight loss.   -B12 and folate levels normal 10. Diarrhea: Added probiotic and fiber. Discontinued Ensure and added prostat.  Stools still mushy with incont  Overall improved, ? GI blood loss, one stool + and one stool neg  -added imodium 11. Hypokalemia: Likely due to nutritional deficits as well as diarrhea.   -Potassium supplement  Repeat labs ordered  Potassium 3.5 on 11/16  Recheck on 11/22, normal 12. Leucocytosis with neutropenia: Resolved  Continue to monitor.  13.  Acute blood loss anemia  Hb 9.0 on 11/16 , 7.9 on recheck 11/22- 8.3 today, no need for transfusion Cont to monitor   14.  Probable post CVA seizure,Keppra, if patient becomes more lethargic we can reduce the dose at 250 mg twice a day 15.  Urinary retention,still requiring  ICP, on  Flomax, increase  Urecholine, Mix of incont void and cathing LOS: 12 days A FACE TO FACE EVALUATION WAS PERFORMED  Charlett Blake 03/29/2018, 8:19 AM

## 2018-03-29 NOTE — Progress Notes (Addendum)
Physical Therapy Session Note  Patient Details  Name: Maria Avila MRN: [id number] Date of Birth: 19-Sep-1950  Today's Date: 03/29/2018 PT Individual Time: 1478-2956 PT Individual Time Calculation (min): 54 min   Short Term Goals: Week 2:  PT Short Term Goal 1 (Week 2): Pt will consistently complete bed<>w/c transfers with max assist +1. PT Short Term Goal 2 (Week 2): Pt will ambulate 25 ft with LRAD +2 assist. PT Short Term Goal 3 (Week 2): Pt will complete sit<>supine with min assist. PT Short Term Goal 4 (Week 2): Pt will propel w/c 50 ft with mod assist.  Skilled Therapeutic Interventions/Progress Updates:  Pt received in bed with husband present but not attending session. No c/o pain reported during session. Pt transfers supine>sitting EOB with min assist with therapist initiating moving BLE to EOB and cuing pt to utilize bed rails to assist trunk to upright sitting. Pt transferred to w/c via stedy lift with +2 assist and transported pt to gym via w/c dependent assist for time management. Donned lite gait sling with pt standing at rail with max assist +1 and pt with BLE knee extension despite therapist's best attempts at approximating knees to facilitation more flexion. In lite gait, assisted pt with ambulating in the hallway with +2 assist and 3rd person to operate lite gait. Pt demonstrates significant knee extension with inability to correct and inability to maintain BLE underneath her for good BOS therefore lite gait lowered to facilitate pt supporting self with BLE more but pt did not. Therapists attempt to maintain B knee flexion and blocking to prevent knee instability and extension to allow pt to take steps but pt unable to coordinate steps and with BLE externally rotated throughout. Encouraged pt to take extremely small steps to prevent LE extension but this was unsuccessful. Pt returned to w/c and then to standing in stedy lift. Pt completed sit<>stand transfers in stedy lift with 1UE  support (pt unable without BUE support) with min/mod assist from therapist with max verbal cuing for B knee flexion before stand>sit with pt able to return demonstrate on initial trials but then unable to flex knees and unaware she wasn't doing so. Returned pt to room and assisted pt with squat pivot w/c>bed on R with pt able to initiate movement but during pivot portion BLE extended from underneath pt and pt required +2 assist to prevent fall and return safely to bed. Therapist assisted pt to bed, repositioning her with +1-2 max assist. Therapist educated pt's husband on pt's CLOF, continued recommendation of 24 hr extensive assist upon d/c, need for ramp installation, request he complete and return home measurement sheet ASAP and pt's potential need for specialty w/c and cushion and possibly a hoyer lift to increase his ease of caring for pt. Pt's husband and any other caregivers will require extensive hands on training prior to pt's d/c. At end of session pt left in bed with alarm set & husband present to supervise.   Therapy Documentation Precautions:  Precautions Precautions: Fall Precaution Comments: apraxic, somewhat impulsive Restrictions Weight Bearing Restrictions: No    Therapy/Group: Individual Therapy  [name] 03/29/2018, 4:13 PM

## 2018-03-29 NOTE — Progress Notes (Signed)
Speech Language Pathology Daily Session Note  Patient Details  Name: Maurine E Martinique MRN: [id number] Date of Birth: [birth year]  Today's Date: 03/29/2018 SLP Individual Time: 1401-1455 SLP Individual Time Calculation (min): 54 min  Short Term Goals: Week 2: SLP Short Term Goal 1 (Week 2): Pt will utilize compensatory memory aids to recall orientation information with Mod A cues.  SLP Short Term Goal 2 (Week 2): Pt will sustain attention to basic task for ~ 15 minutes with Mod A cues.  SLP Short Term Goal 3 (Week 2): Pt will complete basic problem solving tasks with Mod A cues.  SLP Short Term Goal 4 (Week 2): Pt will demonstrate intellectual awareness by listing 2 physical and 2 cognitive deficits related to acute illness with Mod A cues.   Skilled Therapeutic Interventions: Skilled ST services focused on cognitive skills. SLP facilitated orientation of time, place and siutaion throughout session pt required mod-min A verbal cues. SLP facilitated basic problem solving skills utilizing sequencing numbers in sets of 10 (1-10, 10-20, 20-30) with mod-min A verbal cues, however required max-mod A verbal cues to sequence set of 15 and max A verbals 1-30. Pt required mod verbal cues for sustained attention to functional task. Pt was left with call bell within reach and bed alarm set. SLP recommends to continue skilled ST services.      Pain Pain Assessment Pain Scale: 0-10 Pain Score: 3   Therapy/Group: Individual Therapy  Nikkia Devoss  [facility] 03/29/2018, 12:37 PM

## 2018-03-30 ENCOUNTER — Inpatient Hospital Stay (HOSPITAL_COMMUNITY): Payer: Self-pay | Admitting: Occupational Therapy

## 2018-03-30 ENCOUNTER — Inpatient Hospital Stay (HOSPITAL_COMMUNITY): Payer: Self-pay

## 2018-03-30 ENCOUNTER — Inpatient Hospital Stay (HOSPITAL_COMMUNITY): Payer: Self-pay | Admitting: Physical Therapy

## 2018-03-30 NOTE — Progress Notes (Signed)
Social Work Patient ID: Maria Avila, female   DOB: 10/28/1950, 67 y.o.   MRN: [id number] Insurance update faxed to [facility] Nestor Ramp will await response regarding approval for continued stay.

## 2018-03-30 NOTE — Plan of Care (Signed)
  Problem: Consults Goal: RH STROKE PATIENT EDUCATION Description See Patient Education module for education specifics  Outcome: Progressing Goal: Nutrition Consult-if indicated Outcome: Progressing   Problem: RH BOWEL ELIMINATION Goal: RH STG MANAGE BOWEL WITH ASSISTANCE Description STG Manage Bowel with mod Assistance.  Outcome: Progressing   Problem: RH BLADDER ELIMINATION Goal: RH STG MANAGE BLADDER WITH ASSISTANCE Description STG Manage Bladder With mod Assistance  Outcome: Progressing   Problem: RH SKIN INTEGRITY Goal: RH STG SKIN FREE OF INFECTION/BREAKDOWN Description Patients skin will remain free from further infection or breakdown during rehab stay with mod assist.  Outcome: Progressing Goal: RH STG MAINTAIN SKIN INTEGRITY WITH ASSISTANCE Description STG Maintain Skin Integrity With mod Assistance.  Outcome: Progressing Goal: RH STG ABLE TO PERFORM INCISION/WOUND CARE W/ASSISTANCE Description STG Able To Perform Incision/Wound Care With total Assistance from caregiver.  Outcome: Progressing   Problem: RH SAFETY Goal: RH STG ADHERE TO SAFETY PRECAUTIONS W/ASSISTANCE/DEVICE Description STG Adhere to Safety Precautions With min Assistance/Device.  Outcome: Progressing   Problem: RH KNOWLEDGE DEFICIT Goal: RH STG INCREASE KNOWLEDGE OF HYPERTENSION Description Min assist.  Outcome: Progressing Goal: RH STG INCREASE KNOWLEDGE OF DYSPHAGIA/FLUID INTAKE Description Min assist  Outcome: Progressing Goal: RH STG INCREASE KNOWLEGDE OF HYPERLIPIDEMIA Description Min assist  Outcome: Progressing Goal: RH STG INCREASE KNOWLEDGE OF STROKE PROPHYLAXIS Description Min assist  Outcome: Progressing

## 2018-03-30 NOTE — Progress Notes (Deleted)
Social Work Patient ID: Maria Avila, female   DOB: 1950-12-10, 67 y.o.   MRN: [id number] Met with pt who has concerns regarding discharge tomorrow. She is having more pain and is aware of her MRI order by MD. Team feels has reached her goals but nursing is still not ambulating to the bathroom. Will touch base with all today to make sure ready for discharge tomorrow.

## 2018-03-30 NOTE — Progress Notes (Signed)
Speech Language Pathology Daily Session Note  Patient Details  Name: Maria Avila MRN: [id number] Date of Birth: 08-21-1950  Today's Date: 03/30/2018 SLP Individual Time: 0802-0900 SLP Individual Time Calculation (min): 58 min  Short Term Goals: Week 2: SLP Short Term Goal 1 (Week 2): Pt will utilize compensatory memory aids to recall orientation information with Mod A cues.  SLP Short Term Goal 2 (Week 2): Pt will sustain attention to basic task for ~ 15 minutes with Mod A cues.  SLP Short Term Goal 3 (Week 2): Pt will complete basic problem solving tasks with Mod A cues.  SLP Short Term Goal 4 (Week 2): Pt will demonstrate intellectual awareness by listing 2 physical and 2 cognitive deficits related to acute illness with Mod A cues.   Skilled Therapeutic Interventions:Skilled ST services focused on cognitive skills. SLP facilitated basic problem solving accessing breakfast tray, pt required mod-min A verbal cues. SLP facilitated orientation of situation, place and time, pt required mod A verbal cues. SLP created larger print visual aids for days of the week and year which is often missed, pt required min A verbal cues to utilizing large print aids. SLP facilitated identification of deficits pt required mod A verbal cues for 2 physical and 2 cognitive deficits. SLP facilitated basic problem solving skill utilizing 3 step sequence cards pt required max A verbal cues. Pt required mod A verbal cues for sustained attention in 15 minute intervals. Pt was left in room with call bell within reach and bed alarm set. SLP reccomends to continue skilled services.     Pain    Therapy/Group: Individual Therapy  Shayan Bramhall  [facility] 03/30/2018, 7:58 AM

## 2018-03-30 NOTE — Progress Notes (Signed)
Nutrition Follow-up  DOCUMENTATION CODES:   Non-severe (moderate) malnutrition in context of chronic illness  INTERVENTION:   - Continue MVI with minerals daily  - Continue Pro-stat 30 ml BID, each supplement provides 100 kcal and 15 grams of protein  - Lactaid milk daily with breakfast meal  - Continue Magic cup TID with meals, each supplement provides 290 kcal and 9 grams of protein  NUTRITION DIAGNOSIS:   Moderate Malnutrition related to chronic illness (chronic colitis) as evidenced by mild muscle depletion, moderate muscle depletion, mild fat depletion, moderate fat depletion.  Ongoing  GOAL:   Patient will meet greater than or equal to 90% of their needs  Progressing  MONITOR:   PO intake, Supplement acceptance, Diet advancement, I & O's, Weight trends, Labs, Skin  REASON FOR ASSESSMENT:   Malnutrition Screening Tool, Consult Calorie Count  ASSESSMENT:   67 year old female with PMH significant for T2DM, HTN, heavy alcohol use, lymphocytosis/leucopenia, and colitis with 6 month history of N/V/D and anorexia with 40 lb weight loss and abdominal pain. Pt was admitted on 02/16/18 with negative extensive work-up and was discharged to SNF for rehab. Pt was readmitted on 03/04/18 with AMS, hypoxia, and tachycardia due to sepsis with near complete opacification of left chest due to empyema. Pt was intubated and left chest tube placed with drainage of purulent material. Pt started on IV antibiotics. Pt extubated 11/4. Gastrografin swallow negative for perforation, empyema felt likely due to aspiration PNA. Brain MRI reviewed showing mulitple CVAs. Chest tube treated with tPA for thrombolysis and to help with reexpansion of lung and discontinued on 11/9 as non-functional. Pt received TPN during previous admission (11/2-11/9).  11/18 - per [facility] note, pt with unstageable pressure injury to sacrum 11/20 - hydrotherapy initiated (MWF)  Per review of weights in chart, weight has  been stable since 11/22. Suspect weight of 71.2 kg entered on admission was entered in error or due to scale error.  Spoke with pt, husband, and additional family member at bedside. Pt in good spirits and family states that they are pleased with her progress.  Pt states that she enjoys [employer] and is eating them with meals. Pt's husband states that pt is taking the Pro-stat.  Pt asks if Lactaid milk needs to come TID or whether RD could order it daily with breakfast. Will change order to daily with breakfast as pt's PO intake has increased.  Pt finishing Magic Cup at time of RD visit. Noted ~75% completed lunch meal tray at bedside.  Meal Completion: 75-100% x last 8 meals  Medications reviewed and include: Pepcid, Pro-stat BID, lactobacillus, Imodium, MVI with minerals daily, Fibercon, K-dur 20 mEq BID, thiamine daily  Labs reviewed.  Diet Order:   Diet Order            DIET DYS 3 Room service appropriate? Yes; Fluid consistency: Thin  Diet effective now              EDUCATION NEEDS:   Education needs have been addressed  Skin:  Skin Assessment: Skin Integrity Issues: Stage III: ischial tuberosity Unstageable: sacrum Incisions: open left flank, perineum  Last BM:  11/26 (small type 6)  Height:   Ht Readings from Last 1 Encounters:  03/10/18 5\' 2"  (1.575 m)    Weight:   Wt Readings from Last 1 Encounters:  03/29/18 52 kg    Ideal Body Weight:  50 kg  BMI:  Body mass index is 20.97 kg/m.  Estimated Nutritional Needs:  Kcal:  1900-2100  Protein:  105-120 grams  Fluid:  > 1.9 L    Gaynell Face, MS, RD, LDN Inpatient Clinical Dietitian Pager: [phone] Weekend/After Hours: [phone]

## 2018-03-30 NOTE — Plan of Care (Signed)
  Problem: Consults Goal: [facility] STROKE PATIENT EDUCATION Description See Patient Education module for education specifics  03/30/2018 1623 by Adria Devon, LPN Outcome: Progressing 03/30/2018 1620 by Ellison Carwin A, LPN Outcome: Progressing Goal: Nutrition Consult-if indicated 03/30/2018 1623 by Adria Devon, LPN Outcome: Progressing 03/30/2018 1620 by Ellison Carwin A, LPN Outcome: Progressing   Problem: RH BOWEL ELIMINATION Goal: RH STG MANAGE BOWEL WITH ASSISTANCE Description STG Manage Bowel with mod Assistance.  03/30/2018 1623 by Adria Devon, LPN Outcome: Progressing 03/30/2018 1620 by Ellison Carwin A, LPN Outcome: Progressing   Problem: RH BLADDER ELIMINATION Goal: RH STG MANAGE BLADDER WITH ASSISTANCE Description STG Manage Bladder With mod Assistance  03/30/2018 1623 by Ellison Carwin A, LPN Outcome: Progressing 03/30/2018 1620 by Ellison Carwin A, LPN Outcome: Progressing   Problem: RH SKIN INTEGRITY Goal: RH STG SKIN FREE OF INFECTION/BREAKDOWN Description Patients skin will remain free from further infection or breakdown during rehab stay with mod assist.  03/30/2018 1623 by Adria Devon, LPN Outcome: Progressing 03/30/2018 1620 by Adria Devon, LPN Outcome: Progressing Goal: RH STG MAINTAIN SKIN INTEGRITY WITH ASSISTANCE Description STG Maintain Skin Integrity With mod Assistance.  03/30/2018 1623 by Adria Devon, LPN Outcome: Progressing 03/30/2018 1620 by Adria Devon, LPN Outcome: Progressing Goal: RH STG ABLE TO PERFORM INCISION/WOUND CARE W/ASSISTANCE Description STG Able To Perform Incision/Wound Care With total Assistance from caregiver.  03/30/2018 1623 by Adria Devon, LPN Outcome: Progressing 03/30/2018 1620 by Ellison Carwin A, LPN Outcome: Progressing   Problem: RH SAFETY Goal: RH STG ADHERE TO SAFETY PRECAUTIONS W/ASSISTANCE/DEVICE Description STG Adhere to Safety  Precautions With min Assistance/Device.  03/30/2018 1623 by Adria Devon, LPN Outcome: Progressing 03/30/2018 1620 by Ellison Carwin A, LPN Outcome: Progressing   Problem: RH KNOWLEDGE DEFICIT Goal: RH STG INCREASE KNOWLEDGE OF HYPERTENSION Description Min assist.  03/30/2018 1623 by Adria Devon, LPN Outcome: Progressing 03/30/2018 1620 by Ellison Carwin A, LPN Outcome: Progressing Goal: RH STG INCREASE KNOWLEDGE OF DYSPHAGIA/FLUID INTAKE Description Min assist  03/30/2018 1623 by Adria Devon, LPN Outcome: Progressing 03/30/2018 1620 by Ellison Carwin A, LPN Outcome: Progressing Goal: RH STG INCREASE KNOWLEGDE OF HYPERLIPIDEMIA Description Min assist  03/30/2018 1623 by Adria Devon, LPN Outcome: Progressing 03/30/2018 1620 by Adria Devon, LPN Outcome: Progressing Goal: RH STG INCREASE KNOWLEDGE OF STROKE PROPHYLAXIS Description Min assist  03/30/2018 1623 by Adria Devon, LPN Outcome: Progressing 03/30/2018 1620 by Adria Devon, LPN Outcome: Progressing

## 2018-03-30 NOTE — Progress Notes (Signed)
Occupational Therapy Session Note  Patient Details  Name: Maria Avila MRN: [id number] Date of Birth: 1950-10-10  Today's Date: 03/30/2018 OT Individual Time: 7829-5621 and 1130-1200 OT Individual Time Calculation (min): 43 min and 30 min   Short Term Goals: Week 2:  OT Short Term Goal 1 (Week 2): Pt will don pants with mod assist at sit > stand level OT Short Term Goal 2 (Week 2): Pt will complete bathing with mod assist at sit > stand level OT Short Term Goal 3 (Week 2): Pt will complete toilet transfer with max assist   Skilled Therapeutic Interventions/Progress Updates:    1) Treatment session with focus on LB dressing at bed level and sit > stand.  Pt received supine in bed not wearing pants.  Engaged in bed mobility with mod cues for sequencing of rolling, while donning incontinence brief pt incontinent of bowel.  Engaged in hygiene while rolling and applied clean brief.  Pt able to thread LLE through pant leg with setup to obtain figure 4 position in bed, pt required assistance to thread RLE and then engaged in bridging to attempt to pull pants over hips.  Therapist provided physical assistance to maintain BLE positioning while pt engaged in bridging and then assisted with pulling pants over hips while rolling to complete dressing task.  Pt completed sidelying to sitting with min assist.  Squat pivot transfer to w/c with max assist +2 due to decreased anterior weight shift and BLE extensor tone.  Engaged in sit > stand with focus on anterior weight shift and use of mirror for visual feedback for upright standing balance.  Engaged in reaching while standing to challenge standing balance and increase upright posture.  Pt required mod assist for standing balance and +2 for safety due to tendency to lean posterior and BLE weakness.  Returned to room and left upright in w/c with seat belt alarm on and all needs in reach.  2) Treatment session with focus on functional use of RUE and discussion  with pt's husband about home setup.  Pt eating lunch upon arrival.  Engaged in session with focus on increased use of dominant RUE during self-feeding.  Pt able to utilize utensil and bring food items to mouth without issue.  Pt did require additional assistance when reaching with RUE to obtain drink or roll that were farther away.  Therapist provided physical assistance at elbow to facilitate reaching.  Pt's husband present and provided home measurements to therapist.  Engaged in discussion about home setup with pt's bathroom doorways being only 23 7/8", discussed recommendation for hospital bed and drop arm BSC to access toileting needs whether pt can access bathroom or not.  Discussed decreasing burden of care with bathing at bed level and use of hospital bed functions for caregiver ease.  Pt's husband reports understanding.  Pt's husband will require extensive hands on education prior to d/c home.  Therapy Documentation Precautions:  Precautions Precautions: Fall Precaution Comments: apraxic, somewhat impulsive Restrictions Weight Bearing Restrictions: No Pain: Pain Assessment Pain Scale: 0-10 Pain Score: 0-No pain Faces Pain Scale: No hurt   Therapy/Group: Individual Therapy  Simonne Come 03/30/2018, 12:24 PM

## 2018-03-30 NOTE — Progress Notes (Addendum)
Physical Therapy Session Note  Patient Details  Name: Maria Avila MRN: [id number] Date of Birth: Feb 12, 1951  Today's Date: 03/30/2018 PT Individual Time: 1303-1400 PT Individual Time Calculation (min): 57 min   Short Term Goals: Week 2:  PT Short Term Goal 1 (Week 2): Pt will consistently complete bed<>w/c transfers with max assist +1. PT Short Term Goal 2 (Week 2): Pt will ambulate 25 ft with LRAD +2 assist. PT Short Term Goal 3 (Week 2): Pt will complete sit<>supine with min assist. PT Short Term Goal 4 (Week 2): Pt will propel w/c 50 ft with mod assist.  Skilled Therapeutic Interventions/Progress Updates:  Pt received in bathroom with nursing staff assisting her with peri hygiene and toilet transfers. During this time, therapist educated pt's husband [name]) on need to have ramp installed by anticipated d/c date, as well as ongoing education regarding DME (hospital bed, TIS w/c with specialty cushion, and hoyer lift) as well as education regarding pt's w/c not fitting into bathroom and need for [facility]. Discussed f/u therapy in SNF vs HHPT. Also educated [name] on pt's need to be repositioned in bed every 2 hours to prevent further skin breakdown upon d/c. [name] continues to report that he will take pt home upon d/c. Transported pt to/from gym via w/c dependent assist for time management. At high/low table pt transferred sit<>stand with max assist with therapist assisting with maintaining B knee flexion and blocking to prevent instability and movement. Pt also requires MAX cuing for 1UE placement on w/c armrest for sit>stand. On one occasion pt without anterior weight shifting and powering up with BLE, instead sliding to edge of seat requiring +2 total assist to safely scoot back in w/c seat. While standing pt engaged in moving objects L<>R on high low table maintaining 1UE support and requiring MAX assist for standing balance 2/2 L/posterior lean. Pt reports need to use restroom and is returned  to room & transferred onto toilet with stedy lift. Pt with incontinent BM & therapist managing all clothing & brief dependent assist. Therapist attempted peri hygiene but blood noted & charge nurse Neoma Laming) made aware. At end of session pt left sitting on toilet with husband present & Neoma Laming aware, about to enter room.   Therapy Documentation Precautions:  Precautions Precautions: Fall Precaution Comments: apraxic, somewhat impulsive Restrictions Weight Bearing Restrictions: No      Therapy/Group: Individual Therapy  [name] 03/30/2018, 2:08 PM

## 2018-03-30 NOTE — Progress Notes (Signed)
[place] PHYSICAL MEDICINE & REHABILITATION PROGRESS NOTE   Subjective/Complaints:  Pt denies buttocks pain   ROS: Unreliable due to cognition, no resp distress  Objective:   No results found. Recent Labs    03/29/18 0409  WBC 9.3  HGB 8.3*  HCT 26.9*  PLT 224   No results for input(s): NA, K, CL, CO2, GLUCOSE, BUN, CREATININE, CALCIUM in the last 72 hours.  Intake/Output Summary (Last 24 hours) at 03/30/2018 0705 Last data filed at 03/30/2018 0200 Gross per 24 hour  Intake 60 ml  Output 950 ml  Net -890 ml     Physical Exam: Vital Signs Blood pressure 125/84, pulse 99, temperature 97.7 F (36.5 C), temperature source Oral, resp. rate 18, weight 52 kg, SpO2 94 %. Constitutional: No distress . Vital signs reviewed. HENT: Normocephalic.  Atraumatic. Eyes: EOMI. No discharge. Cardiovascular: RRR.  No JVD. Respiratory: crackles at Left base no resp distress, no wheezes GI: BS +. Non-distended. Musculoskeletal: She exhibits bilateral lower extremity edema Neurological: She is alert and oriented x1.  Delayed processing.  Motor: 4/5 bue and ble  Skin: Sacral wound dressed. Left CT site sutures removed  Psychiatric: Restless.  Confused.  Assessment/Plan: 1. 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  Physiatrist is providing close team supervision and 24 hour management of active medical problems listed below.  Physiatrist and rehab team continue to assess barriers to discharge/monitor patient progress toward functional and medical goals  Care Tool:  Bathing              Bathing assist Assist Level: 2 Helpers(per reports)     Upper Body Dressing/Undressing Upper body dressing   What is the patient wearing?: Pull over shirt    Upper body assist Assist Level: Minimal Assistance - Patient > 75%    Lower Body Dressing/Undressing Lower  body dressing      What is the patient wearing?: Pants     Lower body assist Assist for lower body dressing: Maximal Assistance - Patient 25 - 49%     Toileting Toileting    Toileting assist Assist for toileting: 2 Helpers     Transfers Chair/bed transfer  Transfers assist     Chair/bed transfer assist level: Dependent - mechanical lift(or +2 assist for squat pivot)     Locomotion Ambulation   Ambulation assist   Ambulation activity did not occur: Safety/medical concerns(apraxia)  Assist level: 2 helpers(2 person assist + 3rd person to operate lite gait) Assistive device: Lite Gait Max distance: 20 ft    Walk 10 feet activity   Assist     Assist level: 2 helpers Assistive device: Lite Gait   Walk 50 feet activity   Assist           Walk 150 feet activity   Assist           Walk 10 feet on uneven surface  activity   Assist           Wheelchair     Assist Will patient use wheelchair at discharge?: Yes Type of Wheelchair: Manual    Wheelchair assist level: Maximal Assistance - Patient 25 - 49% Max wheelchair distance: 15    Wheelchair 50 feet with 2 turns activity    Assist    Wheelchair 50 feet with 2 turns activity did not occur: Safety/medical concerns(fatigue)   Assist Level: Minimal Assistance - Patient > 75%  Wheelchair 150 feet activity     Assist Wheelchair 150 feet activity did not occur: Safety/medical concerns(fatigue)         Medical Problem List and Plan: 1.  Limitations in mobility, self-care, endurance secondary to L>R infarcts and multiple medical issues.  -Continue CIR Tent D/C 12/6 min/mod A level, team conf in am  Repeat head CT no hemorrhagic conversion  AMS with witnessed short seizure, EEG shows generalized slowing.  Also starting po Keppra monitoring response to new medication this point it does not appear that this is causing additional sedation   2.  BLE DVTs/Anticoagulation:  Pharmaceutical: Other (comment)-on Eliquis 3. Pain Management: tylenol prn.  4. Mood: LCSW to follow for evaluation and support.  5. Neuropsych: This patient is not capable of making decisions on her own behalf. 6. Skin/Wound Care: Air mattress due to sacral breakdown.   -nutrition/local skin care to chest/sacrum 7. Fluids/Electrolytes/Nutrition:  Continue dysphagia 3 thin diet. - may d/c CBG Off TNA. I~668ml po  BMP within acceptable range on 11/16-  8. Empyema:off abx afebrile, no SOB 9. Malnutrition/Heavy alcohol use: Poor intake with 40 lbs weight loss.   -B12 and folate levels normal 10. Diarrhea: Added probiotic and fiber. Discontinued Ensure and added prostat.  Stools still mushy with incont  Overall improved, ? GI blood loss, one stool + and one stool neg  -added imodium 11. Hypokalemia: Likely due to nutritional deficits as well as diarrhea.   -Potassium supplement  Repeat labs ordered  Potassium 3.5 on 11/16  Recheck on 11/22, normal 12. Leucocytosis with neutropenia: Resolved  Continue to monitor.  13.  Acute blood loss anemia  Hb 9.0 on 11/16 , 7.9 on recheck 11/22- 8.3 11/25, no need for transfusion Cont to monitor   14.  Probable post CVA seizure,Keppra, if patient becomes more lethargic we can reduce the dose at 250 mg twice a day 15.  Urinary retention,still requiring  ICP, on  Flomax, increase  Urecholine, Mix of incont void and cathing LOS: 13 days A FACE TO FACE EVALUATION WAS PERFORMED  Maria Avila 03/30/2018, 7:05 AM

## 2018-03-31 ENCOUNTER — Inpatient Hospital Stay (HOSPITAL_COMMUNITY): Payer: Self-pay | Admitting: Speech Pathology

## 2018-03-31 ENCOUNTER — Inpatient Hospital Stay (HOSPITAL_COMMUNITY): Payer: Self-pay | Admitting: Physical Therapy

## 2018-03-31 ENCOUNTER — Encounter (HOSPITAL_COMMUNITY): Payer: Self-pay | Admitting: Psychology

## 2018-03-31 ENCOUNTER — Inpatient Hospital Stay (HOSPITAL_COMMUNITY): Payer: Self-pay | Admitting: Occupational Therapy

## 2018-03-31 DIAGNOSIS — I6340 Cerebral infarction due to embolism of unspecified cerebral artery: Secondary | ICD-10-CM

## 2018-03-31 NOTE — Progress Notes (Signed)
Physical Therapy Session Note  Patient Details  Name: Maria Avila MRN: [id number] Date of Birth: [birth year]  Today's Date: 03/31/2018 PT Individual Time: 4098-1191 PT Individual Time Calculation (min): 64 min   Short Term Goals: Week 2:  PT Short Term Goal 1 (Week 2): Pt will consistently complete bed<>w/c transfers with max assist +1. PT Short Term Goal 2 (Week 2): Pt will ambulate 25 ft with LRAD +2 assist. PT Short Term Goal 3 (Week 2): Pt will complete sit<>supine with min assist. PT Short Term Goal 4 (Week 2): Pt will propel w/c 50 ft with mod assist.  Skilled Therapeutic Interventions/Progress Updates:    Patient received up in [facility] finishing breakfast, pleasant and willing to work with therapy. Transported her to dynavision with totalA in [facility] for time and energy conservation, then worked on seated cross midline and lateral reaching activities with [place] for R UE due to gross UE weakness, Mod cues for forward lean during activity. Also worked on standing activities in [place] for safety, Mod cues for hand placement and forward translation of head for sit to stand transfers, able to perform sit to stand with as little as MinA and as much as [place] today. Performed reaching/tossing activities in stedy with [place] for R UE and Mod-MaxA with Mod cues to maintain upright posture with hip extension as she tends to drift back into flexed position. Able to gait train approximately 22f with 3 muskateers technique and close WC follow, noted LEs extending out from under her requiring MaxA from therapists to maintain upright and for LE progression forward. Finished session with cognitive problem solving activities in unsupported sitting requiring Mod-Max cues for correct puzzle solving, then assisted wound care therapist in transferring her back to bed with MinAx2 in stedy. She was left in bed with wound care therapists attending, all other needs met this morning.   Therapy Documentation Precautions:   Precautions Precautions: Fall Precaution Comments: apraxic, somewhat impulsive Restrictions Weight Bearing Restrictions: No General:   Vital Signs:   Pain: Pain Assessment Pain Scale: 0-10 Pain Score: 0-No pain    Therapy/Group: Individual Therapy  KDeniece ReePT, DPT, CBIS  Supplemental Physical Therapist [facility]   Pager [phone] Rehab Office [phone]  03/31/2018, 9:19 AM

## 2018-03-31 NOTE — Progress Notes (Signed)
Social Work Patient ID: Maria Avila, female   DOB: [birth year], 67 y.o.   MRN: [id number] Met with pt and husband to discuss team conference and the amount of care pt will require at discharge. Both want to work on her going home and not to a NH. Made husband aware of his need to be here next week daily and to do hands on care. He is working on a ramp for the house and will begin learning her care and dressing changes. He will plan to be here daily next week and work with therapies and nursing on pt's care needs.

## 2018-03-31 NOTE — Patient Care Conference (Signed)
Inpatient RehabilitationTeam Conference and Plan of Care Update Date: 03/31/2018   Time: 10:30 AM    Patient Name: Maria Avila      Medical Record Number: [id number]  Date of Birth: 01/18/1951 Sex: Female         Room/Bed: 4W14C/4W14C-01 Payor Info: Payor: [employer] MEDICARE / Plan: [facility] MEDICARE / Product Type: *No Product type* /    Admitting Diagnosis: L CVA  Admit Date/Time:  03/17/2018  2:25 PM Admission Comments: No comment available   Primary Diagnosis:  <principal problem not specified> Principal Problem: <principal problem not specified>  Patient Active Problem List   Diagnosis Date Noted  . Acute blood loss anemia   . Impulsive   . Stroke due to embolism ([place]) 03/17/2018  . Hypokalemia   . ETOH abuse   . Dysphagia, post-stroke   . Benign essential HTN   . Acute deep vein thrombosis (DVT) of distal vein of both lower extremities (HCC)   . Sinus tachycardia   . Chest tube in place   . Esophageal perforation   . Perforation esophagus   . Sepsis with acute renal failure ([place])   . Acute deep vein thrombosis (DVT) of both peroneal veins   . Leukocytosis   . Sepsis due to Pseudomonas species ([place])   . Cerebral embolism with cerebral infarction 03/09/2018  . Malnutrition of moderate degree 03/08/2018  . Empyema ([place]) 03/04/2018  . Pressure injury of skin 03/04/2018  . Acute respiratory failure with hypercapnia ([place]) 03/04/2018  . Diarrhea 02/26/2018  . Pancytopenia ([place]) 02/19/2018  . Hypotension 02/19/2018  . Severe protein-calorie malnutrition ([place]) 02/17/2018  . Colitis 02/16/2018  . Weakness 02/16/2018  . Nausea vomiting and diarrhea 02/16/2018  . Diabetes ([place]) 02/16/2018  . Essential hypertension 02/16/2018  . Anemia 02/16/2018    Expected Discharge Date: Expected Discharge Date: 04/09/18  Team Members Present: Physician leading conference: Dr. Alysia Penna Social Worker Present: Ovidio Kin, LCSW Nurse Present: Heather Roberts, RN PT  Present: Lavone Nian, PT OT Present: Simonne Come, OT SLP Present: Weston Anna, SLP PPS Coordinator present : Daiva Nakayama, RN, CRRN     Current Status/Progress Goal Weekly Team Focus  Medical   cont with aggressive wound care to sacral decubitus, no pain , no further seizures  reduce fall risk, heal sacral wound  cont wound care, increase endurance   Bowel/Bladder   Incontinent of B&B; LBM 03/31/18; I &O cath q4-6 hr if no void  Continent of B&B  Assess Bladder and Bowel  q shift and prn   Swallow/Nutrition/ Hydration   Dys. 3 textures with thin liquids, supervision   Mod I  Increase use of swallowing strategies    ADL's   mod assist bed mobility, mod-max assist bathing and dressing at bed level, max to +2 assist for squat pivot transfers  Min-mod assist, need to downgrade to mod assist overall  ADL retraining, sit > stand, standing tolerance, transfers, pt/family education   Mobility   min assist rolling, mod assist supine>sit, +2 squat pivot, attempting gait with lite gait  min<>mod assist overall for bed mobility & transfers, recommending transfer and w/c level upon d/c, min assist w/c mobility, max assist gait with PT only  transfers, balance, gait, standing tolerance, NMR, bed mobility, pt/family education & d/c planning   Communication             Safety/Cognition/ Behavioral Observations  Mod-Max A  Mod A   sustained attention, basic problem solving, recall, awareness and orientation  Pain   C/o generalized pain  Pain < or =2  Assess pain q shift and prn and provide medications as needed   Skin   MASD to perineum and groin treated with pericare and Gerhardts creme; stg 3 sacrum wound treatments santy and wet to dry dressing  Prevent further breakdown and infection; promote treatments for healthy wound healing  Assess skin q shift and as needed and provided treatment and educate patient av      *See Care Plan and progress notes for long and short-term goals.      Barriers to Discharge  Current Status/Progress Possible Resolutions Date Resolved   Physician    Medical stability;Wound Care     cognition limiting progress  cont rehab, caregiver training vs SNF      Nursing                  PT  Inaccessible home environment;Decreased caregiver support;Home environment access/layout;Lack of/limited family support;Incontinence  4 steps to enter home without rails with therapist recommending ramp, caregivers will require extensive hands on training prior to d/c, may need hoyer lift & specialty w/c upon d/c              OT                  SLP                SW                Discharge Planning/Teaching Needs:  Husband will need to begin family training to see if it is feasible for pt to go home from here. She will be much care for husband. Pt making slow progress in therapeis goals being downgraded.      Team Discussion:  Making very slow progress in therapies, due to cognition, can't bear weight through her legs. Downgraded goals to mos assist level. Hydrotherapy every other day-wound looks better. Attention/problem solving along with recall issues. Pain is managed now. Orientation waxes and wanes. Husband will need extensive family education prior to discharge home. Husband does not want to have go to a NH this time.  Revisions to Treatment Plan:  Downgraded goals to mod level and target DC 12/6    Continued Need for Acute Rehabilitation Level of Care: The patient requires daily medical management by a physician with specialized training in physical medicine and rehabilitation for the following conditions: Daily direction of a multidisciplinary physical rehabilitation program to ensure safe treatment while eliciting the highest outcome that is of practical value to the patient.: Yes Daily medical management of patient stability for increased activity during participation in an intensive rehabilitation regime.: Yes Daily analysis of laboratory values  and/or radiology reports with any subsequent need for medication adjustment of medical intervention for : Neurological problems;Mood/behavior problems   I attest that I was present, lead the team conference, and concur with the assessment and plan of the team.   Elease Hashimoto 03/31/2018, 11:26 AM

## 2018-03-31 NOTE — Consult Note (Signed)
Neuropsychological Consultation   Patient:   Maria Avila   DOB:   14-Dec-1950  MR Number:  [id number]  Location:  [facility] [id number] [place] [address] Dept: [place]: [phone]           Date of Service:   03/31/2018  Start Time:   11 AM End Time:   12 PM  Provider/Observer:  Ilean Skill, Psy.D.       Clinical Neuropsychologist       Billing Code/Service: [phone] 4 Units  Chief Complaint:    Maria Avila is a 67 year old female with history of diabetes, HTN, past history of heavy alcohol use, lymphocytosis/leucopenia, colitis with history of weight loss and abdominal pain.  Admitted on 02/16/2018 with negative workup and discharged to SNF for rehab.  Readmitted on 03/04/2018 with mental status changes, hypoxia and tachycardia due to sepsis with near complete opacification of left chest due to empyema.  Intubated and left chest tube placed.  IV antibotics.  MRI showed multiple CVAs.  Bilateral multiple CVA with greatest involvement in left parietal lobe.  Patient has improved with mental status but continues to have motor deficits and sensory deficits mostly in legs and feet.    Reason for Service:  HPI:  Maria Avila is a 67 year old female with history of T2DM, HTN, history of heavy alcohol use,lymphocytosis/leucopenia, colitis with 6 month year history of N/V/diarrhea/anorexia with 40 lbs weigh loss and abdominal pain who was admitted on 02/16/18 wwith negative extensive work up and was discharged to SNF for rehab. History taken from chart review, patient and family. She was readmitted on 03/04/18 with mental status changes, hypoxia and tachycardia due to sepsis with near complete opacification of left chest due to empyema. She was intubated, left chest tube placed with drainage of purulent material and was started on IV antibiotics. Dr. Roxan Hockey recommended gastrografin swallow  which was negative for perforation and he felt that empyema likely due to aspiration PNA. Brain MRI reviewed, showing mulitple CVAs. Per report, bilateral multiple CVA, greatest in left parietal lobe. Chest tube treated with tPA for thrombolysis and to help with reexpansion of lung and discontinued on 11/9 as non functional. . Culture positive for E coli, enterococcus and pseudomonas aeruginosa and antibiotics narrowed to Levaquin with stop date 11/21.  Current Status:  Patient had some mental status/cognitive issues today but was able to follow questions and answer appropriately.  She was fatigued during interaction but denies significant depression or anxiety.  Husband was present helping patient.     Behavioral Observation: Maria Avila  presents as a 67 y.o.-year-old Right African American Female who appeared her stated age. her dress was Appropriate and she was Well Groomed and her manners were Appropriate to the situation.  her participation was indicative of Appropriate and Redirectable behaviors.  There were any physical disabilities noted.  she displayed an appropriate level of cooperation and motivation.     Interactions:    Active Appropriate and Redirectable  Attention:   abnormal and attention span appeared shorter than expected for age  Memory:   abnormal; remote memory intact, recent memory impaired  Visuo-spatial:  not examined  Speech (Volume):  low  Speech:   normal; normal  Thought Process:  Coherent and Relevant  Though Content:  WNL; not suicidal and not homicidal  Orientation:   person, place and situation  Judgment:   Fair  Planning:  Fair  Affect:    Lethargic  Mood:    Dysphoric  Insight:   Fair  Intelligence:   normal  Medical History:   Past Medical History:  Diagnosis Date  . Colitis   . Diabetes mellitus without complication ([place])   . Hypertension   . Malnutrition ([place])     Psychiatric History:  No prior psychiatric history  Family  Med/Psych History:  Family History  Problem Relation Age of Onset  . Alzheimer's disease Mother   . Colon cancer Father     Impression/DX:  Maria Avila is a 67 year old female with history of diabetes, HTN, past history of heavy alcohol use, lymphocytosis/leucopenia, colitis with history of weight loss and abdominal pain.  Admitted on 02/16/2018 with negative workup and discharged to SNF for rehab.  Readmitted on 03/04/2018 with mental status changes, hypoxia and tachycardia due to sepsis with near complete opacification of left chest due to empyema.  Intubated and left chest tube placed.  IV antibotics.  MRI showed multiple CVAs.  Bilateral multiple CVA with greatest involvement in left parietal lobe.  Patient has improved with mental status but continues to have motor deficits and sensory deficits mostly in legs and feet.     Disposition/Plan:  Worked on coping with current physical and cognitive deficits and extended hospital stay.  Patient denied significant depressive features that were interfering with therapeutic efforts.            Electronically Signed   _______________________ Ilean Skill, Psy.D.

## 2018-03-31 NOTE — Progress Notes (Signed)
Physical Therapy Wound Treatment Patient Details  Name: Kaylyn E Martinique MRN: [id number] Date of Birth: [birth year]  Today's Date: 03/31/2018 PT Individual Time: 0915-0944 PT Individual Time Calculation (min): 29 min   Subjective  Subjective: Pt denies any pain Patient and Family Stated Goals: Pt not stated Prior Treatments: Dressing changes  Pain Score: Pain Score: 0-No pain  Wound Assessment  Pressure Injury 03/22/18 Unstageable - Full thickness tissue loss in which the base of the ulcer is covered by slough (yellow, tan, gray, green or brown) and/or eschar (tan, brown or black) in the wound bed. Patient had MASD POA, patient continues to be  (Active)  Dressing Type Gauze (Comment);Foam;Moist to [employer] (skin prep) 03/31/2018  9:59 AM  Dressing Changed;Clean;Dry;Intact 03/31/2018  9:59 AM  Dressing Change Frequency Daily 03/31/2018  9:59 AM  State of Healing Eschar 03/31/2018  9:59 AM  Site / Wound Assessment Pale;Pink;Red;Yellow 03/31/2018  9:59 AM  % Wound base Red or Granulating 50% 03/31/2018  9:59 AM  % Wound base Yellow/Fibrinous Exudate 50% 03/31/2018  9:59 AM  % Wound base Black/Eschar 0% 03/31/2018  9:59 AM  % Wound base Other/Granulation Tissue (Comment) 0% 03/31/2018  9:59 AM  Peri-wound Assessment Intact 03/31/2018  9:59 AM  Wound Length (cm) 5.5 cm 03/29/2018  9:00 AM  Wound Width (cm) 8 cm 03/29/2018  9:00 AM  Wound Depth (cm) 1.5 cm 03/29/2018  9:00 AM  Wound Surface Area (cm^2) 44 cm^2 03/29/2018  9:00 AM  Wound Volume (cm^3) 66 cm^3 03/29/2018  9:00 AM  Undermining (cm) 1 to 1.5 cm from 7 o'clock to 2 o'clock  03/29/2018  9:00 AM  Margins Unattached edges (unapproximated) 03/31/2018  9:59 AM  Drainage Amount Minimal 03/31/2018  9:59 AM  Drainage Description Serous 03/31/2018  9:59 AM  Treatment Debridement (Selective);Hydrotherapy (Pulse lavage);Packing (Saline gauze) 03/31/2018  9:59 AM   Santyl applied to wound bed prior to applying dressing.     Hydrotherapy Pulsed lavage therapy - wound location: sacrum Pulsed Lavage with Suction (psi): 8 psi(8-12) Pulsed Lavage with Suction - Normal Saline Used: 1000 mL Pulsed Lavage Tip: Tip with splash shield Selective Debridement Selective Debridement - Location: sacrum Selective Debridement - Tools Used: Forceps;Scissors Selective Debridement - Tissue Removed: yellow necrotic tissue   Wound Assessment and Plan  Wound Therapy - Assess/Plan/Recommendations Wound Therapy - Clinical Statement: Continuing to progress with removal of necrotic tissue. Performing hydrotherapy 3x/wk now.  Wound Therapy - Functional Problem List: decr mobility and sitting tolerance Factors Delaying/Impairing Wound Healing: Incontinence;Immobility;Multiple medical problems;Polypharmacy;Diabetes Mellitus Hydrotherapy Plan: Debridement;Dressing change;Patient/family education;Pulsatile lavage with suction Wound Therapy - Frequency: 3X / week Wound Therapy - Follow Up Recommendations: Home health RN Wound Plan: See above  Wound Therapy Goals- Improve the function of patient's integumentary system by progressing the wound(s) through the phases of wound healing (inflammation - proliferation - remodeling) by: Decrease Necrotic Tissue to: 40 Decrease Necrotic Tissue - Progress: Progressing toward goal Increase Granulation Tissue to: 60 Increase Granulation Tissue - Progress: Progressing toward goal  Goals will be updated until maximal potential achieved or discharge criteria met.  Discharge criteria: when goals achieved, discharge from hospital, MD decision/surgical intervention, no progress towards goals, refusal/missing three consecutive treatments without notification or medical reason.  GP     Shary Decamp [place] 03/31/2018, 10:02 AM [place] Pager [phone] Office [phone]

## 2018-03-31 NOTE — Progress Notes (Signed)
[facility] PHYSICAL MEDICINE & REHABILITATION PROGRESS NOTE   Subjective/Complaints: Discussed sacral decubitus with hydrotherapy PT, increased granulation tissues No new issues per RN, remains incont  ROS: Unreliable due to cognition, no resp distress  Objective:   No results found. Recent Labs    03/29/18 0409  WBC 9.3  HGB 8.3*  HCT 26.9*  PLT 224   No results for input(s): NA, K, CL, CO2, GLUCOSE, BUN, CREATININE, CALCIUM in the last 72 hours.  Intake/Output Summary (Last 24 hours) at 03/31/2018 0754 Last data filed at 03/31/2018 0515 Gross per 24 hour  Intake 490 ml  Output 725 ml  Net -235 ml     Physical Exam: Vital Signs Blood pressure 127/79, pulse 94, temperature 97.6 F (36.4 C), temperature source Oral, resp. rate 18, weight 52 kg, SpO2 98 %. Constitutional: No distress . Vital signs reviewed. HENT: Normocephalic.  Atraumatic. Eyes: EOMI. No discharge. Cardiovascular: RRR.  No JVD. Respiratory: crackles at Left base no resp distress, no wheezes GI: BS +. Non-distended. Musculoskeletal: She exhibits bilateral lower extremity edema Neurological: She is alert and oriented x1.  Delayed processing.  Motor: 4/5 bue and ble  Skin: Sacral wound dressed.  Psychiatric: Restless.  Confused.  Assessment/Plan: 1. 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  Physiatrist is providing close team supervision and 24 hour management of active medical problems listed below.  Physiatrist and rehab team continue to assess barriers to discharge/monitor patient progress toward functional and medical goals  Care Tool:  Bathing              Bathing assist Assist Level: 2 Helpers(per reports)     Upper Body Dressing/Undressing Upper body dressing   What is the patient wearing?: Pull over shirt    Upper body assist Assist Level: Minimal  Assistance - Patient > 75%    Lower Body Dressing/Undressing Lower body dressing      What is the patient wearing?: Pants     Lower body assist Assist for lower body dressing: Maximal Assistance - Patient 25 - 49%     Toileting Toileting    Toileting assist Assist for toileting: 2 Helpers     Transfers Chair/bed transfer  Transfers assist     Chair/bed transfer assist level: Dependent - mechanical lift(or +2 assist for squat pivot)     Locomotion Ambulation   Ambulation assist   Ambulation activity did not occur: Safety/medical concerns(apraxia)  Assist level: 2 helpers(2 person assist + 3rd person to operate lite gait) Assistive device: Lite Gait Max distance: 20 ft    Walk 10 feet activity   Assist     Assist level: 2 helpers Assistive device: Lite Gait   Walk 50 feet activity   Assist           Walk 150 feet activity   Assist           Walk 10 feet on uneven surface  activity   Assist           Wheelchair     Assist Will patient use wheelchair at discharge?: Yes Type of Wheelchair: Manual    Wheelchair assist level: Maximal Assistance - Patient 25 - 49% Max wheelchair distance: 15    Wheelchair 50 feet with 2 turns activity    Assist    Wheelchair 50 feet with 2 turns activity did not occur: Safety/medical concerns(fatigue)   Assist Level: Minimal Assistance -  Patient > 75%   Wheelchair 150 feet activity     Assist Wheelchair 150 feet activity did not occur: Safety/medical concerns(fatigue)         Medical Problem List and Plan: 1.  Limitations in mobility, self-care, endurance secondary to L>R infarcts and multiple medical issues.  -Continue CIR Team conference today please see physician documentation under team conference tab, met with team face-to-face to discuss problems,progress, and goals. Formulized individual treatment plan based on medical history, underlying problem and comorbidities.  2.   BLE DVTs/Anticoagulation: Pharmaceutical: Other (comment)-on Eliquis 3. Pain Management: tylenol prn.  4. Mood: LCSW to follow for evaluation and support.  5. Neuropsych: This patient is not capable of making decisions on her own behalf. 6. Skin/Wound Care: Air mattress due to sacral breakdown.   -hydrotherapy per PT 7. Fluids/Electrolytes/Nutrition:  Continue dysphagia 3 thin diet. - may d/c CBG Off TNA. I~464m po  BMP within acceptable range on 11/16-  8. Empyema:off abx afebrile, no SOB 9. Malnutrition/Heavy alcohol use: Poor intake with 40 lbs weight loss.   -B12 and folate levels normal 10. Diarrhea: Added probiotic and fiber. Discontinued Ensure and added prostat.  Stools still mushy with incont  Overall improved, ? GI blood loss, one stool + and one stool neg  -added imodium 11. Hypokalemia: Recheck on 11/22, normal  13.  Acute blood loss anemia  Hb 9.0 on 11/16 , 7.9 on recheck 11/22- 8.3 11/25, no need for transfusion Cont to monitor   14.  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  Urinary retention,still requiring  ICP, on  Flomax, increase  Urecholine, Mix of incont void and cathing LOS: 14 days A FACE TO FACE EVALUATION WAS PERFORMED  ACharlett Blake11/27/2019, 7:54 AM

## 2018-03-31 NOTE — Plan of Care (Signed)
  Problem: Consults Goal: RH STROKE PATIENT EDUCATION Description See Patient Education module for education specifics  Outcome: Progressing Goal: Nutrition Consult-if indicated Outcome: Progressing   Problem: RH BOWEL ELIMINATION Goal: RH STG MANAGE BOWEL WITH ASSISTANCE Description STG Manage Bowel with mod Assistance.  Outcome: Progressing   Problem: RH BLADDER ELIMINATION Goal: RH STG MANAGE BLADDER WITH ASSISTANCE Description STG Manage Bladder With mod Assistance  Outcome: Progressing   Problem: RH SKIN INTEGRITY Goal: RH STG SKIN FREE OF INFECTION/BREAKDOWN Description Patients skin will remain free from further infection or breakdown during rehab stay with mod assist.  Outcome: Progressing Goal: RH STG MAINTAIN SKIN INTEGRITY WITH ASSISTANCE Description STG Maintain Skin Integrity With mod Assistance.  Outcome: Progressing Goal: RH STG ABLE TO PERFORM INCISION/WOUND CARE W/ASSISTANCE Description STG Able To Perform Incision/Wound Care With total Assistance from caregiver.  Outcome: Progressing   Problem: RH SAFETY Goal: RH STG ADHERE TO SAFETY PRECAUTIONS W/ASSISTANCE/DEVICE Description STG Adhere to Safety Precautions With min Assistance/Device.  Outcome: Progressing   Problem: RH KNOWLEDGE DEFICIT Goal: RH STG INCREASE KNOWLEDGE OF HYPERTENSION Description Min assist.  Outcome: Progressing Goal: RH STG INCREASE KNOWLEDGE OF DYSPHAGIA/FLUID INTAKE Description Min assist  Outcome: Progressing Goal: RH STG INCREASE KNOWLEGDE OF HYPERLIPIDEMIA Description Min assist  Outcome: Progressing Goal: RH STG INCREASE KNOWLEDGE OF STROKE PROPHYLAXIS Description Min assist  Outcome: Progressing

## 2018-03-31 NOTE — Progress Notes (Signed)
Occupational Therapy Session Note  Patient Details  Name: Maria Avila MRN: [id number] Date of Birth: 25-Oct-1950  Today's Date: 03/31/2018 OT Individual Time: 1410-3013 OT Individual Time Calculation (min): 58 min    Short Term Goals: Week 2:  OT Short Term Goal 1 (Week 2): Pt will don pants with mod assist at sit > stand level OT Short Term Goal 2 (Week 2): Pt will complete bathing with mod assist at sit > stand level OT Short Term Goal 3 (Week 2): Pt will complete toilet transfer with max assist   Skilled Therapeutic Interventions/Progress Updates:    Treatment session with focus on anterior weight shift and trunk control when shifting/reaching to Rt.  Pt received supine in bed, completed bed mobility with min assist and mod cues for sequencing.  Completed squat pivot transfer with pt completing lateral scoots with min guard and then +2 for safety during squat pivot to w/c and then to therapy mat.  Engaged in reaching activity with focus on anterior weight shift as needed for sit > stand and squat pivot transfers.  Increased focus on lateral weight shifting to allow reaching to Rt as pt continues to demonstrate posterior lean and weight shift left due to decreased ability/fearfulness when weight shifting Rt.  Placed wedge under Lt hip to further facilitate weight shift and therapist provided manual facilitation at hips and torso to further facilitate weight shift to Rt.  Therapist placed wedge under w/c cushion on Lt to improve sitting posture in w/c.  Educated pt and pt's husband about wedge for improved sitting posture.  Pt left in TIS w/c with seat belt alarm on and husband present for supervision.  Therapy Documentation Precautions:  Precautions Precautions: Fall Precaution Comments: apraxic, somewhat impulsive Restrictions Weight Bearing Restrictions: No General:   Vital Signs: Therapy Vitals Pulse Rate: 93 Resp: 14 BP: 97/63 Patient Position (if appropriate): Lying Oxygen  Therapy SpO2: 100 % O2 Device: Room Air Pain:  Pt with no c/o pain   Therapy/Group: Individual Therapy  Simonne Come 03/31/2018, 3:43 PM

## 2018-03-31 NOTE — Progress Notes (Signed)
Speech Language Pathology Daily Session Note  Patient Details  Name: Maria Avila MRN: [id number] Date of Birth: 1950-11-29  Today's Date: 03/31/2018 SLP Individual Time: 1415-1500 SLP Individual Time Calculation (min): 45 min  Short Term Goals: Week 2: SLP Short Term Goal 1 (Week 2): Pt will utilize compensatory memory aids to recall orientation information with Mod A cues.  SLP Short Term Goal 2 (Week 2): Pt will sustain attention to basic task for ~ 15 minutes with Mod A cues.  SLP Short Term Goal 3 (Week 2): Pt will complete basic problem solving tasks with Mod A cues.  SLP Short Term Goal 4 (Week 2): Pt will demonstrate intellectual awareness by listing 2 physical and 2 cognitive deficits related to acute illness with Mod A cues.   Skilled Therapeutic Interventions: Skilled treatment session focused on cognitive goals. SLP facilitated session by providing Mod A verbal cues for basic problem solving during a calendar making task, especially in regards to attention to RUE. Patient also sequenced 4 step picture sequencing cards with 50-75% accuracy with extra time and Mod A verbal cues. Patient requested to use the bathroom and transferred to the commode via the [place] with Max A verbal cues for problem solving and sequencing with task. Patient transferred back to bed at end of session and left with alarm on and husband present. Continue with current plan of care.      Pain No/Denies Pain   Therapy/Group: Individual Therapy  Sarajean Dessert, [place] 03/31/2018, 3:14 PM

## 2018-04-01 NOTE — Progress Notes (Signed)
[place] PHYSICAL MEDICINE & REHABILITATION PROGRESS NOTE   Subjective/Complaints: Appreciate SW note and Neuropsych consult  ROS: Unreliable due to cognition, no resp distress  Objective:   No results found. No results for input(s): WBC, HGB, HCT, PLT in the last 72 hours. No results for input(s): NA, K, CL, CO2, GLUCOSE, BUN, CREATININE, CALCIUM in the last 72 hours.  Intake/Output Summary (Last 24 hours) at 04/01/2018 0936 Last data filed at 04/01/2018 0852 Gross per 24 hour  Intake 460 ml  Output 434 ml  Net 26 ml     Physical Exam: Vital Signs Blood pressure 111/74, pulse (!) 105, temperature 98.5 F (36.9 C), resp. rate 16, weight 52 kg, SpO2 98 %. Constitutional: No distress . Vital signs reviewed. HENT: Normocephalic.  Atraumatic. Eyes: EOMI. No discharge. Cardiovascular: RRR.  No JVD. Respiratory: crackles at Left base no resp distress, no wheezes GI: BS +. Non-distended. Musculoskeletal: She exhibits bilateral lower extremity edema Neurological: She is alert and oriented x1.  Delayed processing.  Motor: 4/5 bue and ble  Skin: Sacral wound dressed.  Psychiatric: Restless.  Confused.  Assessment/Plan: 1. 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  Physiatrist is providing close team supervision and 24 hour management of active medical problems listed below.  Physiatrist and rehab team continue to assess barriers to discharge/monitor patient progress toward functional and medical goals  Care Tool:  Bathing              Bathing assist Assist Level: 2 Helpers(per reports)     Upper Body Dressing/Undressing Upper body dressing   What is the patient wearing?: Pull over shirt    Upper body assist Assist Level: Minimal Assistance - Patient > 75%    Lower Body Dressing/Undressing Lower body dressing      What is the patient  wearing?: Pants     Lower body assist Assist for lower body dressing: Maximal Assistance - Patient 25 - 49%     Toileting Toileting    Toileting assist Assist for toileting: 2 Helpers     Transfers Chair/bed transfer  Transfers assist     Chair/bed transfer assist level: Dependent - mechanical lift     Locomotion Ambulation   Ambulation assist   Ambulation activity did not occur: Safety/medical concerns(apraxia)  Assist level: 2 helpers Assistive device: Other (comment)(3 muskateers ) Max distance: 32ft   Walk 10 feet activity   Assist     Assist level: 2 helpers Assistive device: Lite Gait   Walk 50 feet activity   Assist           Walk 150 feet activity   Assist           Walk 10 feet on uneven surface  activity   Assist           Wheelchair     Assist Will patient use wheelchair at discharge?: Yes Type of Wheelchair: Manual    Wheelchair assist level: Maximal Assistance - Patient 25 - 49% Max wheelchair distance: 15    Wheelchair 50 feet with 2 turns activity    Assist    Wheelchair 50 feet with 2 turns activity did not occur: Safety/medical concerns(fatigue)   Assist Level: Minimal Assistance - Patient > 75%   Wheelchair 150 feet activity     Assist Wheelchair 150 feet activity did not occur: Safety/medical concerns(fatigue)         Medical Problem  List and Plan: 1.  Limitations in mobility, self-care, endurance secondary to L>R infarcts and multiple medical issues.  -Continue CIR PT, OT, SLP- goal of return to home.  2.  BLE DVTs/Anticoagulation: Pharmaceutical: Other (comment)-on Eliquis 3. Pain Management: tylenol prn.  4. Mood: LCSW to follow for evaluation and support.  5. Neuropsych: This patient is not capable of making decisions on her own behalf. 6. Skin/Wound Care: Air mattress due to sacral breakdown.   -hydrotherapy per PT- resume in am , will not have this at home will cont sanyl  collagenase vs W to D sterile dressing at home 7. Fluids/Electrolytes/Nutrition:  Continue dysphagia 3 thin diet. - may d/c CBG Off TNA. I~437ml po  BMP within acceptable range on 11/16-  8. Empyema:off abx afebrile, no SOB 9. Malnutrition/Heavy alcohol use: Poor intake with 40 lbs weight loss.   -B12 and folate levels normal 10. Diarrhea: Added probiotic and fiber. Discontinued Ensure and added prostat.  Stools still mushy with incont  Overall improved, ? GI blood loss, one stool + and one stool neg  -added imodium 11. Hypokalemia: Recheck on 11/22, normal  13.  Acute blood loss anemia  Hb 9.0 on 11/16 , 7.9  11/22- 8.3 11/25, no need for transfusion Recheck in am   14.  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  Urinary retention,still requiring  ICP, on  Flomax, increase  Urecholine, Mix of incont void and cathing LOS: 15 days A FACE TO FACE EVALUATION WAS PERFORMED  Charlett Blake 04/01/2018, 9:36 AM

## 2018-04-02 ENCOUNTER — Inpatient Hospital Stay (HOSPITAL_COMMUNITY): Payer: Medicare Other

## 2018-04-02 ENCOUNTER — Inpatient Hospital Stay (HOSPITAL_COMMUNITY): Payer: Self-pay | Admitting: Occupational Therapy

## 2018-04-02 ENCOUNTER — Inpatient Hospital Stay (HOSPITAL_COMMUNITY): Payer: Self-pay

## 2018-04-02 ENCOUNTER — Inpatient Hospital Stay (HOSPITAL_COMMUNITY): Payer: Self-pay | Admitting: Speech Pathology

## 2018-04-02 ENCOUNTER — Inpatient Hospital Stay (HOSPITAL_COMMUNITY): Payer: Self-pay | Admitting: Physical Therapy

## 2018-04-02 LAB — URINALYSIS, ROUTINE W REFLEX MICROSCOPIC
BILIRUBIN URINE: NEGATIVE
GLUCOSE, UA: NEGATIVE mg/dL
HGB URINE DIPSTICK: NEGATIVE
KETONES UR: NEGATIVE mg/dL
Leukocytes, UA: NEGATIVE
Nitrite: NEGATIVE
PH: 6 (ref 5.0–8.0)
Protein, ur: NEGATIVE mg/dL
Specific Gravity, Urine: 1.02 (ref 1.005–1.030)

## 2018-04-02 LAB — CBC
HEMATOCRIT: 28.1 % — AB (ref 36.0–46.0)
Hemoglobin: 8.8 g/dL — ABNORMAL LOW (ref 12.0–15.0)
MCH: 31.1 pg (ref 26.0–34.0)
MCHC: 31.3 g/dL (ref 30.0–36.0)
MCV: 99.3 fL (ref 80.0–100.0)
Platelets: 329 10*3/uL (ref 150–400)
RBC: 2.83 MIL/uL — ABNORMAL LOW (ref 3.87–5.11)
RDW: 16.6 % — AB (ref 11.5–15.5)
WBC: 21 10*3/uL — AB (ref 4.0–10.5)
nRBC: 0 % (ref 0.0–0.2)

## 2018-04-02 MED ORDER — QUETIAPINE FUMARATE 25 MG PO TABS
25.0000 mg | ORAL_TABLET | Freq: Every day | ORAL | Status: DC
Start: 2018-04-02 — End: 2018-04-07
  Administered 2018-04-03 – 2018-04-06 (×4): 25 mg via ORAL
  Filled 2018-04-02 (×5): qty 1

## 2018-04-02 MED ORDER — TRAMADOL HCL 50 MG PO TABS
50.0000 mg | ORAL_TABLET | Freq: Once | ORAL | Status: AC
  Administered 2018-04-02: 50 mg via ORAL
  Filled 2018-04-02: qty 1

## 2018-04-02 NOTE — Progress Notes (Signed)
Pt lying in bed c/o pain, in bed on right side.  Husband asked for something for pain.  Informed next dose due at 1400. Husband washed dressing change to buttocks and said that he cant do that when he gets home. Called PA-c, recd new orders. OT also reports that pt not able to follow directions as she did this am. Will continue to monitor.

## 2018-04-02 NOTE — Progress Notes (Signed)
Speech Language Pathology Weekly Progress and Session Note  Patient Details  Name: Maria Avila MRN: [id number] Date of Birth: [birth year]  Beginning of progress report period: March 25, 2018 End of progress report period: April 01, 2018  Today's Date: 04/02/2018 SLP Individual Time: 1005-1030 SLP Individual Time Calculation (min): 25 min  Short Term Goals: Week 2: SLP Short Term Goal 1 (Week 2): Pt will utilize compensatory memory aids to recall orientation information with Mod A cues.  SLP Short Term Goal 1 - Progress (Week 2): Not met SLP Short Term Goal 2 (Week 2): Pt will sustain attention to basic task for ~ 15 minutes with Mod A cues.  SLP Short Term Goal 2 - Progress (Week 2): Met SLP Short Term Goal 3 (Week 2): Pt will complete basic problem solving tasks with Mod A cues.  SLP Short Term Goal 3 - Progress (Week 2): Progressing toward goal SLP Short Term Goal 4 (Week 2): Pt will demonstrate intellectual awareness by listing 2 physical and 2 cognitive deficits related to acute illness with Mod A cues.  SLP Short Term Goal 4 - Progress (Week 2): Not met    New Short Term Goals: Week 3: SLP Short Term Goal 1 (Week 3): Pt will complete basic problem solving tasks with Mod A cues.  SLP Short Term Goal 2 (Week 3): Pt will sustain attention to basic task for ~ 15 minutes with Min A cues.  SLP Short Term Goal 3 (Week 3): Pt will locate month and day of week on calendar with Mod A cues in 7 out of 10 opportunties.  SLP Short Term Goal 4 (Week 3): Pt will follow 1 step directions related to ADLs/transfers with Mod A cues in 5 out of 10 opportunities.   Weekly Progress Updates:   Pt has made slow progress this reporting period and as a result she is progressing towards her goals of overall Mod A. During structured cognitive tasks, pt is able to increase performance to Mod A. However during functional unstructured tasks (completing self-care or participating in transfers) pt requires  Max A cues. As such, STGs and LTGs were set to reflect more functional tasks. Pt continues to require extensive assistance following directions during transfers, locating orientation information and completing general ADLs. Pt will require extensive support at discharge and recommend SNF for continued therapy and assistance with ADLs. However understand pt's husband is willing to paritcipate in education and provide care at home. Will continue to target the above mentioned deficits and provide education on compensatory strategies within home environment.      Intensity: Minumum of 1-2 x/day, 30 to 90 minutes Frequency: 3 to 5 out of 7 days Duration/Length of Stay: 12/6 Treatment/Interventions: Cognitive remediation/compensation;Cueing hierarchy;Functional tasks;Patient/family education;Therapeutic Activities   Daily Session  Skilled Therapeutic Interventions: Skilled treatment session focused on cognition goals. SLP facilitated session by providing Max A to cognitively seqeunce putting on clothes. SLP also facilitated session by providing Mod A to sequence transfer via Stedy to wheelchair. Lastly, SLP facilitated session by providing Max A faded occasionally to Mod A overall cues to play simple card game (War). Pt was handed off to OT.      General    Pain Pain Assessment Pain Scale: 0-10 Pain Score: 0-No pain  Therapy/Group: Individual Therapy  Latise Dilley 04/02/2018, 10:35 AM

## 2018-04-02 NOTE — Progress Notes (Addendum)
[place] PHYSICAL MEDICINE & REHABILITATION PROGRESS NOTE   Subjective/Complaints:  No c/os, does not remember Thanksgiving holiday  ROS: Unreliable due to cognition, no resp distress  Objective:   No results found. No results for input(s): WBC, HGB, HCT, PLT in the last 72 hours. No results for input(s): NA, K, CL, CO2, GLUCOSE, BUN, CREATININE, CALCIUM in the last 72 hours.  Intake/Output Summary (Last 24 hours) at 04/02/2018 0908 Last data filed at 04/01/2018 1724 Gross per 24 hour  Intake 360 ml  Output 400 ml  Net -40 ml     Physical Exam: Vital Signs Blood pressure 116/80, pulse (!) 102, temperature 98.9 F (37.2 C), resp. rate 18, weight 52 kg, SpO2 99 %. Constitutional: No distress . Vital signs reviewed. HENT: Normocephalic.  Atraumatic. Eyes: EOMI. No discharge. Cardiovascular: RRR.  No JVD. Respiratory: crackles at Left base no resp distress, no wheezes GI: BS +. Non-distended. Musculoskeletal: She exhibits bilateral lower extremity edema Neurological: She is alert and oriented x1.  Delayed processing.  Motor: 4/5 bue and ble  Skin: Sacral wound dressed.  Psychiatric: Restless.  Confused.  Assessment/Plan: 1. 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  Physiatrist is providing close team supervision and 24 hour management of active medical problems listed below.  Physiatrist and rehab team continue to assess barriers to discharge/monitor patient progress toward functional and medical goals  Care Tool:  Bathing              Bathing assist Assist Level: 2 Helpers(per reports)     Upper Body Dressing/Undressing Upper body dressing   What is the patient wearing?: Pull over shirt    Upper body assist Assist Level: Minimal Assistance - Patient > 75%    Lower Body Dressing/Undressing Lower body dressing      What is the  patient wearing?: Pants     Lower body assist Assist for lower body dressing: Maximal Assistance - Patient 25 - 49%     Toileting Toileting    Toileting assist Assist for toileting: 2 Helpers     Transfers Chair/bed transfer  Transfers assist     Chair/bed transfer assist level: Dependent - mechanical lift     Locomotion Ambulation   Ambulation assist   Ambulation activity did not occur: Safety/medical concerns(apraxia)  Assist level: 2 helpers Assistive device: Other (comment)(3 muskateers ) Max distance: 52ft   Walk 10 feet activity   Assist     Assist level: 2 helpers Assistive device: Lite Gait   Walk 50 feet activity   Assist           Walk 150 feet activity   Assist           Walk 10 feet on uneven surface  activity   Assist           Wheelchair     Assist Will patient use wheelchair at discharge?: Yes Type of Wheelchair: Manual    Wheelchair assist level: Maximal Assistance - Patient 25 - 49% Max wheelchair distance: 15    Wheelchair 50 feet with 2 turns activity    Assist    Wheelchair 50 feet with 2 turns activity did not occur: Safety/medical concerns(fatigue)   Assist Level: Minimal Assistance - Patient > 75%   Wheelchair 150 feet activity     Assist Wheelchair 150 feet activity did not occur: Safety/medical concerns(fatigue)  Medical Problem List and Plan: 1.  Limitations in mobility, self-care, endurance secondary to L>R infarcts and multiple medical issues.  -Continue CIR PT, OT, SLP- goal of return to home next wk.  2.  BLE DVTs/Anticoagulation: Pharmaceutical: Other (comment)-on Eliquis 3. Pain Management: tylenol prn.  4. Mood: LCSW to follow for evaluation and support.  5. Neuropsych: This patient is not capable of making decisions on her own behalf. 6. Skin/Wound Care: Air mattress due to sacral breakdown.   -hydrotherapy per PT- resume today , will not have this at home will cont  sanyl collagenase vs W to D sterile dressing at home 7. Fluids/Electrolytes/Nutrition:  Continue dysphagia 3 thin diet. - may d/c CBG Off TNA. I~664ml po  BMP within acceptable range on 11/16-  8. Empyema:off abx afebrile, no SOB 9. Malnutrition/Heavy alcohol use: Poor intake with 40 lbs weight loss.   -B12 and folate levels normal 10. Diarrhea: Added probiotic and fiber. Discontinued Ensure and added prostat.  Stools still mushy with incont  Overall improved, ? GI blood loss, one stool + and one stool neg  -added imodium 11. Hypokalemia: Recheck on 11/22, normal  13.  Acute blood loss anemia  Hb 9.0 on 11/16 , 7.9  11/22- 8.3 11/25, no need for transfusion Recheck today   14.  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  Urinary retention,still requiring  ICP, on  Flomax, increase  Urecholine, bladder scan normal most voids are continent 16.  Agitation improved will reduce seroquel LOS: 16 days A FACE TO FACE EVALUATION WAS PERFORMED  Charlett Blake 04/02/2018, 9:08 AM

## 2018-04-02 NOTE — Progress Notes (Signed)
Occupational Therapy Session Note  Patient Details  Name: Maria Avila MRN: [id number] Date of Birth: [birth year]  Today's Date: 04/02/2018 OT Individual Time: 1030-1100 OT Individual Time Calculation (min): 30 min  and Today's Date: 04/02/2018 OT Co-Treatment Time: 0955-1005 OT Co-Treatment Time Calculation (min): 10 min   Short Term Goals: Week 2:  OT Short Term Goal 1 (Week 2): Pt will don pants with mod assist at sit > stand level OT Short Term Goal 2 (Week 2): Pt will complete bathing with mod assist at sit > stand level OT Short Term Goal 3 (Week 2): Pt will complete toilet transfer with max assist   Skilled Therapeutic Interventions/Progress Updates:    [phone] (Co-tx with SLP):  Pt seen for co-treat with SLP with focus on getting OOB to increase participation during SLP treatment session.  Engaged in [place] at bed level with focus on sequencing with LB dressing.  Therapist provided max multimodal cues for technique and for recall with positioning to increase independence with dressing and bed mobility, requiring mod assist for dressing task.  Utilized Stedy for transfer to w/c with improved anterior weight shift.  Pt left upright in w/c with SLP.  [phone]:  Treatment session with focus on functional use of RUE, trunk control, and transfers. Pt received in handoff from SLP.  Continued to engage in simple card activity that was initiated with SLP.  Therapist positioned on Rt to facilitate weight shift and reaching to Rt to complete task.  Mod cues to utilize RUE and max cues for recall of 1-2 step during card activity.  Pt began to complain of increased pain in buttocks, reporting need to toilet.  Returned to room and transferred to toilet with Stedy.  +2 for safety and for hygiene, with therapist positioned in front of pt to facilitate anterior weight shift during sit > stand and to maintain upright standing balance.  Pt returned to bed due to pain in buttocks and to allow RN  to change soiled dressing on buttocks.  Therapy Documentation Precautions:  Precautions Precautions: Fall Precaution Comments: apraxic, somewhat impulsive Restrictions Weight Bearing Restrictions: No Pain: Pt with no c/o pain during 1st session, c/o pain during 2nd session, RN notified.  Therapy/Group: Individual Therapy and Co-Treatment  Daichi Moris, [facility] 04/02/2018, 12:30 PM

## 2018-04-02 NOTE — Progress Notes (Signed)
Physical Therapy Session Note  Patient Details  Name: Maria Avila MRN: [id number] Date of Birth: 1950/08/29  Today's Date: 04/02/2018 PT Individual Time: 1345-1401 PT Individual Time Calculation (min): 16 min   Short Term Goals: Week 3:  PT Short Term Goal 1 (Week 3): STG = LTG due to anticipated d/c date.  Skilled Therapeutic Interventions/Progress Updates:    pt in bed in 10/10 pain but willing to work on rolling per OT request.  Pt performs rolling each direction x 5 each with initial min/mod A and min cuing, when fatigued pt requires max cuing and mod A for rolling both directions. Pt then performs scooting up in bed with mod A.  Pt left positioned on side to decrease pain, needs at hand, husband present.  Therapy Documentation Precautions:  Precautions Precautions: Fall Precaution Comments: apraxic, somewhat impulsive Restrictions Weight Bearing Restrictions: No General: PT Amount of Missed Time (min): 14 Minutes PT Missed Treatment Reason: Pain Pain: Pt c/o 10/10 buttocks pain, RN aware, attempted repositining   Therapy/Group: Individual Therapy  DONAWERTH,KAREN 04/02/2018, 2:07 PM

## 2018-04-02 NOTE — Progress Notes (Signed)
Physical Therapy Weekly Progress Note  Patient Details  Name: Maria Avila MRN: [id number] Date of Birth: 1950-10-04  Beginning of progress report period: March 26, 2018 End of progress report period: April 02, 2018  Today's Date: 04/02/2018  Patient has met 0 of 4 short term goals.  Pt is making slow progress towards all goals as she continues to be limited physically and by cognitive deficits. Gait has been attempted with 3 muskateers and lite gait but pt demonstrates significant impaired neuromuscular control of all extremities, inability to safely weight bear through BLE as they will frequently extend out from underneath her during standing/gait and squat pivot transfers. Pt continues to require mod assist for bed mobility and max assist for static standing at high/low table. Pt frequently pushes left to prevent weight bearing through R. Pt's husband Maria Avila) has not participated in hands on training yet, but has been educated on need for a ramp installation at the home prior to pt's d/c, as well as educated on DME recommendations and pt's need for extensive 24 hr hands on care. Ongoing therapy would be beneficial to continue to focus on transfers, bed mobility, and NMR as well as hands on caregiver training with use of hospital bed, manual hoyer lift, and w/c. Pt is also scheduled for a custom w/c fitting session this upcoming week as pt will require a specialty chair to prevent further skin breakdown on sacral wound and to promote healing.   Patient continues to demonstrate the following deficits muscle weakness, decreased cardiorespiratoy endurance, ataxia, decreased coordination and decreased motor planning, decreased initiation, decreased attention, decreased awareness, decreased problem solving, decreased safety awareness, decreased memory and delayed processing, and decreased sitting balance, decreased standing balance, decreased postural control, hemiplegia and decreased balance  strategies and therefore will continue to benefit from skilled PT intervention to increase functional independence with mobility.  Patient not progressing toward long term goals.  See goal revision..  Plan of care revisions: d/c w/c mobility & car transfer goals - recommending EMS transport home as pt unable to safely get in elevated car seat height, gait goal for PT only.  PT Short Term Goals Week 2:  PT Short Term Goal 1 (Week 2): Pt will consistently complete bed<>w/c transfers with max assist +1. PT Short Term Goal 1 - Progress (Week 2): Not met PT Short Term Goal 2 (Week 2): Pt will ambulate 25 ft with LRAD +2 assist. PT Short Term Goal 2 - Progress (Week 2): Not met PT Short Term Goal 3 (Week 2): Pt will complete sit<>supine with min assist. PT Short Term Goal 3 - Progress (Week 2): Not met PT Short Term Goal 4 (Week 2): Pt will propel w/c 50 ft with mod assist. PT Short Term Goal 4 - Progress (Week 2): Not met Week 3:  PT Short Term Goal 1 (Week 3): STG = LTG due to anticipated d/c date.  Therapy Documentation Precautions:  Precautions Precautions: Fall Precaution Comments: apraxic, somewhat impulsive Restrictions Weight Bearing Restrictions: No    [name] 04/02/2018, 8:00 AM

## 2018-04-02 NOTE — Progress Notes (Signed)
Physical Therapy Session Note  Patient Details  Name: Maria Avila MRN: [id number] Date of Birth: 1950/10/19  Today's Date: 04/02/2018 PT Individual Time: 6606-0045 PT Individual Time Calculation (min): 25 min  Missed time- 35 minutes (X-ray)  Short Term Goals: Week 3:  PT Short Term Goal 1 (Week 3): STG = LTG due to anticipated d/c date.  Skilled Therapeutic Interventions/Progress Updates:    Pt supine in bed upon PT arrival. Pt sleeping, awakens to verbal stimuli. Pt's husband present, therapist discusses d/c planning including the recommendation that the pt will need to be transported home via non-emergency ambulance. Pt's husband resistant to this idea and reports he has done it with a slideboard before. Pt performs rolling in both directions with max assist, verbal cues for techniques and placement to use bed rails. Pt grimaces but does not report any pain. When therapy tech says pt's names she looks fearful. Therapist dons pants, pt continues to grimace and appear anxious. Transport arrives to take pt to XRAY, therapist doffs pants total assist and reports to RN about pt's changes in behavior this session. Pt missed 35 minutes of skilled therapy tx secondary to being taken off unit for x-ray.     Therapy Documentation Precautions:  Precautions Precautions: Fall Precaution Comments: apraxic, somewhat impulsive Restrictions Weight Bearing Restrictions: No    Therapy/Group: Individual Therapy  Netta Corrigan, PT, DPT 04/02/2018, 8:02 AM

## 2018-04-02 NOTE — Progress Notes (Signed)
Physical Therapy Wound Treatment Patient Details  Name: Maria Avila MRN: [id number] Date of Birth: Sep 07, 1950  Today's Date: 04/02/2018 PT Individual Time: 1914-7829 PT Individual Time Calculation (min): 26 min   Subjective  Subjective: Pt reports pain with pericare after BM but no pain with wound care. Patient and Family Stated Goals: Pt not stated Prior Treatments: Dressing changes  Pain Score:  No pain with wound care. Pain with pericare after BM.   Wound Assessment  Pressure Injury 03/22/18 Unstageable - Full thickness tissue loss in which the base of the ulcer is covered by slough (yellow, tan, gray, green or brown) and/or eschar (tan, brown or black) in the wound bed. Patient had MASD POA, patient continues to be  (Active)  Dressing Type Gauze (Comment);Foam;Moist to [employer] (skin prep) 04/02/2018  9:48 AM  Dressing Changed;Clean;Dry;Intact 04/02/2018  9:48 AM  Dressing Change Frequency Daily 04/02/2018  9:48 AM  State of Healing Eschar 04/02/2018  9:48 AM  Site / Wound Assessment Pale;Pink;Yellow 04/02/2018  9:48 AM  % Wound base Red or Granulating 40% 04/02/2018  9:48 AM  % Wound base Yellow/Fibrinous Exudate 60% 04/02/2018  9:48 AM  % Wound base Black/Eschar 0% 04/02/2018  9:48 AM  % Wound base Other/Granulation Tissue (Comment) 0% 04/02/2018  9:48 AM  Peri-wound Assessment Maceration 04/02/2018  9:48 AM  Wound Length (cm) 5.5 cm 03/29/2018  9:00 AM  Wound Width (cm) 8 cm 03/29/2018  9:00 AM  Wound Depth (cm) 1.5 cm 03/29/2018  9:00 AM  Wound Surface Area (cm^2) 44 cm^2 03/29/2018  9:00 AM  Wound Volume (cm^3) 66 cm^3 03/29/2018  9:00 AM  Undermining (cm) 1 to 1.5 cm from 7 o'clock to 2 o'clock  03/29/2018  9:00 AM  Margins Unattached edges (unapproximated) 04/02/2018  9:48 AM  Drainage Amount Minimal 04/02/2018  9:48 AM  Drainage Description Serous 04/02/2018  9:48 AM  Treatment Debridement (Selective);Hydrotherapy (Pulse lavage);Packing (Saline gauze)  04/02/2018  9:48 AM   Santyl applied to wound bed prior to applying dressing.    Hydrotherapy Pulsed lavage therapy - wound location: sacrum Pulsed Lavage with Suction (psi): 8 psi(8-12) Pulsed Lavage with Suction - Normal Saline Used: 1000 mL Pulsed Lavage Tip: Tip with splash shield Selective Debridement Selective Debridement - Location: sacrum Selective Debridement - Tools Used: Forceps;Scissors Selective Debridement - Tissue Removed: very minimal yellow necrotic tissue   Wound Assessment and Plan  Wound Therapy - Assess/Plan/Recommendations Wound Therapy - Clinical Statement: Pt with very pale wound bed. Only able to remove miniscule amount of necrotic tissue with selective debridement. Believe hydrotherapy is reaching maximal benefit soon. Will see again Monday and consider dc of hydrotherapy. Wound Therapy - Functional Problem List: decr mobility and sitting tolerance Factors Delaying/Impairing Wound Healing: Incontinence;Immobility;Multiple medical problems;Polypharmacy;Diabetes Mellitus Hydrotherapy Plan: Debridement;Dressing change;Patient/family education;Pulsatile lavage with suction Wound Therapy - Frequency: 3X / week Wound Therapy - Follow Up Recommendations: Home health RN Wound Plan: See above  Wound Therapy Goals- Improve the function of patient's integumentary system by progressing the wound(s) through the phases of wound healing (inflammation - proliferation - remodeling) by: Decrease Necrotic Tissue to: 40 Decrease Necrotic Tissue - Progress: Not progressing Increase Granulation Tissue to: 60 Increase Granulation Tissue - Progress: Mot progressing  Goals will be updated until maximal potential achieved or discharge criteria met.  Discharge criteria: when goals achieved, discharge from hospital, MD decision/surgical intervention, no progress towards goals, refusal/missing three consecutive treatments without notification or medical reason.  GP     Shary Decamp  [place] 04/02/2018, 9:54 AM Long Pager [phone] Office [phone]

## 2018-04-02 NOTE — Progress Notes (Signed)
Occupational Therapy Weekly Progress Note  Patient Details  Name: Maria Avila MRN: [id number] Date of Birth: January 10, 1951   Beginning of progress report period: March 25, 2018 End of progress report period: April 02, 2018  Today's Date: 04/02/2018 OT Individual Time: 1300-1340 OT Individual Time Calculation (min): 40 min    Patient has met 0 of 3 short term goals.  Pt has made minimal progress towards goals as she continues to be limited by cognitive deficits and physically.  Pt requires anywhere from min assist to max assist with bed mobility due to decreased sequencing and recall of technique.  Pt continues to require max assist for LB dressing at bed level as pt requiring +2 assistance at sit > stand level due to BLE extending out from underneath her during sit > stand.  Recommending bed level ADLs for pt and caregiver safety.  Pt continues to demonstrate decreased weight shifting towards Rt in sitting and standing, attempted wedge to improve posture however this may have increased pain in buttocks - therefore removed.  Pt's husband has been present intermittently for therapy sessions, therefore have begun discussing recommendations regarding DME and bed level self-care tasks however have not begun hands on training.  Pt's husband will require extensive hands on training with focus on self-care tasks, bed mobility, and transfers.  Patient continues to demonstrate the following deficits: muscle weakness,decreased cardiorespiratoy endurance,impaired timing and sequencing, unbalanced muscle activation, motor apraxia, decreased coordination and decreased motor planning,decreased attention, decreased awareness, decreased problem solving, decreased safety awareness and decreased memoryand decreased sitting balance, decreased standing balance, hemiplegia and decreased balance strategies and therefore will continue to benefit from skilled OT intervention to enhance overall performance with BADL  and Reduce care partner burden.  Patient not progressing toward long term goals.  See goal revision..  Plan of care revisions: downgraded to mod assist bed level.  OT Short Term Goals Week 2:  OT Short Term Goal 1 (Week 2): Pt will don pants with mod assist at sit > stand level OT Short Term Goal 1 - Progress (Week 2): Not met OT Short Term Goal 2 (Week 2): Pt will complete bathing with mod assist at sit > stand level OT Short Term Goal 2 - Progress (Week 2): Not met OT Short Term Goal 3 (Week 2): Pt will complete toilet transfer with max assist  OT Short Term Goal 3 - Progress (Week 2): Not met Week 3:  OT Short Term Goal 1 (Week 3): Pt will don pants with mod assist at bed level 3 out of 4 attempts. OT Short Term Goal 2 (Week 3): Pt will recall sequencing of bed mobility with min cues to decrease burden of care. OT Short Term Goal 3 (Week 3): Pt will complete toilet transfer with max assist   Skilled Therapeutic Interventions/Progress Updates:    Treatment session with focus on LB dressing and bed mobility.  Pt received supine in bed reporting increased pain in buttocks by moaning during bed mobility.  RN notified therapist of pain in pt's buttocks this date and asking if it was related to small wedge placed during therapy session on [place].  Pt did received hydrotherapy this date and was not OOB yesterday - however only sat in w/c for 30-45 mins this AM so unsure if wedge creating increased pressure on buttocks.  However therapist removed wedge.  Pt did not get OOB this session due to pain.  Engaged in bed mobility as pt incontinent of bowel.  Pt required total  assist and multimodal cues for sequencing bed mobility to allow therapist to assist with hygiene (more assistance than even this AM).  Pt demonstrating increased difficulty with following one step directions.  Donned incontinence brief total assist and pants with pt able to thread LLE this session but still requiring total cues for sequencing  and recall.  RN present to provide pain meds and IV RN arrived to take blood, therefore pt remained in bed to allow nursing needs to be met.  Educated pt's husband on positioning and encouraged increased rolling and sidelying to allow for further weight shift off of buttocks.  Therapy Documentation Precautions:  Precautions Precautions: Fall Precaution Comments: apraxic, somewhat impulsive Restrictions Weight Bearing Restrictions: No Pain: Pain Assessment Pain Scale: 0-10 Pain Score: 10-Worst pain ever Pain Type: Acute pain Pain Location: Buttocks Pain Descriptors / Indicators: Aching;Grimacing;Guarding;Tender Pain Frequency: Constant Pain Onset: On-going Pain Intervention(s): Medication (See eMAR);Repositioned;Distraction;Rest;Other (Comment)(removed support from cushion )   Therapy/Group: Individual Therapy  Simonne Come 04/02/2018, 3:20 PM

## 2018-04-03 ENCOUNTER — Inpatient Hospital Stay (HOSPITAL_COMMUNITY): Payer: Self-pay | Admitting: Occupational Therapy

## 2018-04-03 ENCOUNTER — Inpatient Hospital Stay (HOSPITAL_COMMUNITY): Payer: Self-pay | Admitting: Speech Pathology

## 2018-04-03 ENCOUNTER — Inpatient Hospital Stay (HOSPITAL_COMMUNITY): Payer: Self-pay | Admitting: Physical Therapy

## 2018-04-03 LAB — CBC WITH DIFFERENTIAL/PLATELET
ABS IMMATURE GRANULOCYTES: 0.22 10*3/uL — AB (ref 0.00–0.07)
Basophils Absolute: 0 10*3/uL (ref 0.0–0.1)
Basophils Relative: 0 %
Eosinophils Absolute: 0.1 10*3/uL (ref 0.0–0.5)
Eosinophils Relative: 0 %
HCT: 24.9 % — ABNORMAL LOW (ref 36.0–46.0)
HEMOGLOBIN: 7.9 g/dL — AB (ref 12.0–15.0)
Immature Granulocytes: 1 %
Lymphocytes Relative: 20 %
Lymphs Abs: 4.7 10*3/uL — ABNORMAL HIGH (ref 0.7–4.0)
MCH: 31.3 pg (ref 26.0–34.0)
MCHC: 31.7 g/dL (ref 30.0–36.0)
MCV: 98.8 fL (ref 80.0–100.0)
MONO ABS: 1.5 10*3/uL — AB (ref 0.1–1.0)
Monocytes Relative: 6 %
NEUTROS ABS: 17.4 10*3/uL — AB (ref 1.7–7.7)
Neutrophils Relative %: 73 %
Platelets: 294 10*3/uL (ref 150–400)
RBC: 2.52 MIL/uL — AB (ref 3.87–5.11)
RDW: 16.7 % — ABNORMAL HIGH (ref 11.5–15.5)
WBC: 24 10*3/uL — AB (ref 4.0–10.5)
nRBC: 0 % (ref 0.0–0.2)

## 2018-04-03 NOTE — Progress Notes (Signed)
Occupational Therapy Session Note  Patient Details  Name: Maria Avila MRN: [id number] Date of Birth: [birth year]  Today's Date: 04/03/2018 OT Individual Time: 1115-1200 OT Individual Time Calculation (min): 45 min    Short Term Goals: Week 3:  OT Short Term Goal 1 (Week 3): Pt will don pants with mod assist at bed level 3 out of 4 attempts. OT Short Term Goal 2 (Week 3): Pt will recall sequencing of bed mobility with min cues to decrease burden of care. OT Short Term Goal 3 (Week 3): Pt will complete toilet transfer with max assist   Skilled Therapeutic Interventions/Progress Updates:    Treatment session with focus on self-care retraining, sit > stand, and weight shifting to decrease burden of care with self-care tasks.  Pt received upright in w/c expressing desire to get dressed.  Pt donned shirt with mod assist, demonstrating increased confusion when attempting to thread BUE through arm holes.  Pt able to thread RLE through pant leg and therapist assisted with initiating threading LLE with pt able to complete.  Engaged in sit > stand from w/c with therapist seated in arm chair in front to provide blocking at BLE, pt completed sit > stand with mod assist and pulled pants over hips while in standing with support from therapist at BLE and hips.  Engaged in anterior weight shifting and shifting to Rt to engage in reaching activity to further facilitate weight shift.  Encouraged pt to obtain bean bags by food category with pt requiring mod cues.  Pt demonstrating increased difficulty following 1-2 step commands this session, often requiring cues to stop task to redirect and provide command again.  Returned to room and back to bed via [place] as pt reporting pain in buttocks when sitting in w/c.  Pt left upright in bed with husband present to assist with lunch.  Therapy Documentation Precautions:  Precautions Precautions: Fall Precaution Comments: apraxic, somewhat impulsive Restrictions Weight  Bearing Restrictions: No Pain: Pt with c/o pain in buttocks with mobility, repositioned and notified RN.  Therapy/Group: Individual Therapy  Simonne Come 04/03/2018, 12:32 PM

## 2018-04-03 NOTE — Progress Notes (Signed)
Physical Therapy Session Note  Patient Details  Name: Maria Avila MRN: [id number] Date of Birth: Dec 21, 1950  Today's Date: 04/03/2018 PT Individual Time: 0900-0930 PT Individual Time Calculation (min): 30 min   Short Term Goals: Week 3:  PT Short Term Goal 1 (Week 3): STG = LTG due to anticipated d/c date.  Skilled Therapeutic Interventions/Progress Updates:    Pt received seated in bed, reports need to have a bowel movement. Upon inspection pt has already had incontinent bowel movement on chuck pad in bed. Rolling L/R with min A and max multimodal cueing for hand placement and weight shift for rolling transfer. Pt is dependent for pericare. Supine BLE therex x 10 reps with encouragement for pt to count to 10 to keep track of repetitions. Pt unable to count along with therapist but is able to recall she is supposed to be performing 10 reps. Pt left seated in bed with needs in reach, telesitter present, husband present at end of therapy session.  Therapy Documentation Precautions:  Precautions Precautions: Fall Precaution Comments: apraxic, somewhat impulsive Restrictions Weight Bearing Restrictions: No Pain: Pain Assessment Pain Scale: 0-10 Pain Score: 0-No pain    Therapy/Group: Individual Therapy  Excell Seltzer, PT, DPT  04/03/2018, 12:11 PM

## 2018-04-03 NOTE — Progress Notes (Signed)
Occupational Therapy Session Note  Patient Details  Name: Maria Avila MRN: [id number] Date of Birth: [birth year]  Today's Date: 04/03/2018 OT Individual Time: 4142-3953 OT Individual Time Calculation (min): 34 min   Skilled Therapeutic Interventions/Progress Updates:    Pt greeted in bed, reporting having stomachache but wanting to get OOB for tx. Mod A for supine<sit and scooting towards EOB. Mod A for sit<stand in [place] with 2nd helper present for safety. Once she transferred to [facility], pt reported need to void bowels. Transferred pt to elevated toilet with Stedy. When pt cued to lower clothing, she reached for OT's hands on her back, or her shirt. Due to presence of extensor tone at this time, OT completed clothing mgt for safety. Pt with continent B+B void on toilet with increased time. Spouse completed perihygiene while OT provided steady assist for balance. After a few minutes of standing, extensor tone increased and Mod A required for balance. Max A for donning clean LB garments and Mod A sit<stand from toilet. Once her clothing was elevated, pt returned to [place] with 1 helper. She washed hands with supervision. At end of tx pt was reclined in TIS with spouse present.   Therapy Documentation Precautions:  Precautions Precautions: Fall Precaution Comments: apraxic, somewhat impulsive Restrictions Weight Bearing Restrictions: No Pain: No s/s pain during tx  Pain Assessment Pain Scale: 0-10 Pain Score: Asleep Pain Type: Acute pain Pain Location: Abdomen Pain Descriptors / Indicators: Aching Pain Onset: Gradual Pain Intervention(s): Medication (See eMAR) ADL: ADL Eating: Set up, Minimal cueing Where Assessed-Eating: Wheelchair Upper Body Bathing: Moderate assistance Where Assessed-Upper Body Bathing: Sitting at sink, Wheelchair Lower Body Bathing: Dependent Where Assessed-Lower Body Bathing: Bed level Upper Body Dressing: Moderate assistance Where Assessed-Upper Body  Dressing: Wheelchair Lower Body Dressing: Dependent(+2) Where Assessed-Lower Body Dressing: Bed level Toilet Transfer: Dependent Toilet Transfer Method: Other (comment)(Stedy) Toilet Transfer Equipment: Other (comment)(Stedy)     Therapy/Group: Individual Therapy  Tondra Reierson A Chanel Mckesson 04/03/2018, 4:52 PM

## 2018-04-03 NOTE — Progress Notes (Signed)
Patient ID: Maria Avila, female   DOB: 1950-12-27, 67 y.o.   MRN: [id number]   Maria Avila is a 67 y.o. female who was admitted for CIR with bihemispheric infarctions left greater than right with functional deficits.  Past Medical History:  Diagnosis Date  . Colitis   . Diabetes mellitus without complication ([place])   . Hypertension   . Malnutrition ([place])      Subjective: No new complaints. No new problems.  Slept well last night  Objective: Vital signs in last 24 hours: Temp:  [98.3 F (36.8 C)-100.6 F (38.1 C)] 98.3 F (36.8 C) (11/30 0258) Pulse Rate:  [105-115] 105 (11/30 0258) Resp:  [18-20] 18 (11/30 0258) BP: (98-114)/(60-77) 114/77 (11/30 0258) SpO2:  [98 %-100 %] 98 % (11/30 0300) Weight change:  Last BM Date: 04/03/18  Intake/Output from previous day: 11/29 0701 - 11/30 0700 In: 160 [P.O.:160] Out: 900 [Urine:900]  Lab Results  Component Value Date   HGBA1C 5.1 03/10/2018     Patient Vitals for the past 24 hrs:  BP Temp Temp src Pulse Resp SpO2 Weight  04/03/18 0300 - - - - - 98 % -  04/03/18 0258 114/77 98.3 F (36.8 C) Oral (!) 105 18 100 % -  04/02/18 2130 98/60 (!) 100.6 F (38.1 C) - (!) 115 19 98 % -  04/02/18 1811 110/72 100.3 F (37.9 C) Oral (!) 111 20 99 % -     Physical Exam General: No apparent distress   HEENT: Unremarkable Lungs: Normal effort. Lungs clear to auscultation, no crackles or wheezes. Cardiovascular: Regular rate and rhythm, no edema.  Heart rate slightly elevated Abdomen: S/NT/ND; BS(+) Musculoskeletal:  unchanged Neurological: No new neurological deficits Extremities.  Trace edema Skin: clear   Mental state: Alert, oriented, cooperative    Lab Results: BMET    Component Value Date/Time   NA 141 03/26/2018 0424   K 3.9 03/26/2018 0424   CL 113 (H) 03/26/2018 0424   CO2 24 03/26/2018 0424   GLUCOSE 79 03/26/2018 0424   BUN 17 03/26/2018 0424   CREATININE 0.74 03/26/2018 0424   CREATININE 0.78 07/21/2017  1131   CALCIUM 8.5 (L) 03/26/2018 0424   GFRNONAA >60 03/26/2018 0424   GFRNONAA >60 07/21/2017 1131   GFRAA >60 03/26/2018 0424   GFRAA >60 07/21/2017 1131   CBC    Component Value Date/Time   WBC 24.0 (H) 04/03/2018 0412   RBC 2.52 (L) 04/03/2018 0412   HGB 7.9 (L) 04/03/2018 0412   HGB 11.3 (L) 07/21/2017 1131   HCT 24.9 (L) 04/03/2018 0412   HCT 23.7 (L) 02/17/2018 0205   PLT 294 04/03/2018 0412   PLT 186 07/21/2017 1131   MCV 98.8 04/03/2018 0412   MCH 31.3 04/03/2018 0412   MCHC 31.7 04/03/2018 0412   RDW 16.7 (H) 04/03/2018 0412   LYMPHSABS 4.7 (H) 04/03/2018 0412   MONOABS 1.5 (H) 04/03/2018 0412   EOSABS 0.1 04/03/2018 0412   BASOSABS 0.0 04/03/2018 0412    Studies/Results: Dg Chest 2 View  Result Date: 04/02/2018 CLINICAL DATA:  Leukocytosis. EXAM: CHEST - 2 VIEW COMPARISON:  03/14/2018 FINDINGS: Lungs are adequately inflated with subtle residual density over the lateral left midlung likely atelectasis. No evidence of effusion. Cardiomediastinal silhouette and remainder of the exam is unchanged. IMPRESSION: No active cardiopulmonary disease. Electronically Signed   By: Marin Olp M.D.   On: 04/02/2018 17:01    Medications: I have reviewed the patient's current medications.  Assessment/Plan:  Functional deficits secondary to bihemispheric infarcts.  Continue CIR Anemia.  Hemoglobin has dropped to 7.9 from 8.8 yesterday.  We will check stool for occult blood and follow-up with CBC in the morning History of sacral breakdown.  Continue air mattress and hydrotherapy History of empyema.  Presently off antibiotics History of hypokalemia    Length of stay, days: 17  Marletta Lor , MD 04/03/2018, 9:25 AM

## 2018-04-03 NOTE — Progress Notes (Signed)
Speech Language Pathology Daily Session Note  Patient Details  Name: Maria Avila MRN: [id number] Date of Birth: [birth year]  Today's Date: 04/03/2018 SLP Individual Time: 1030-1110 SLP Individual Time Calculation (min): 40 min  Short Term Goals: Week 3: SLP Short Term Goal 1 (Week 3): Pt will complete basic problem solving tasks with Mod A cues.  SLP Short Term Goal 2 (Week 3): Pt will sustain attention to basic task for ~ 15 minutes with Min A cues.  SLP Short Term Goal 3 (Week 3): Pt will locate month and day of week on calendar with Mod A cues in 7 out of 10 opportunties.  SLP Short Term Goal 4 (Week 3): Pt will follow 1 step directions related to ADLs/transfers with Mod A cues in 5 out of 10 opportunities.   Skilled Therapeutic Interventions: Skilled treatment session focused on cognitive goals. Upon arrival, patient requested to use the bathroom. SLP facilitated session by providing Max A verbal cues for problem solving and sequencing with transfer to the commode via the [facility]. Patient continent of bowel and able to stand with assistance for peri care and dressing change for extended period of time but voiced fatigue.  Although patient required cues for safety, she followed commands throughout task with 100% accuracy. During functional conversation, it appeared patient had increased difficulty with word-finding at the sentence level, suspect due to fatigue and required total A to recall events from previous therapy sessions. Patient left upright in wheelchair with husband present. Continue with current plan of care.      Pain No/Denies Pain   Therapy/Group: Individual Therapy  Yazir Koerber 04/03/2018, 12:52 PM

## 2018-04-04 ENCOUNTER — Inpatient Hospital Stay (HOSPITAL_COMMUNITY): Payer: Self-pay | Admitting: Physical Therapy

## 2018-04-04 LAB — CBC
HCT: 24 % — ABNORMAL LOW (ref 36.0–46.0)
Hemoglobin: 7.6 g/dL — ABNORMAL LOW (ref 12.0–15.0)
MCH: 31.1 pg (ref 26.0–34.0)
MCHC: 31.7 g/dL (ref 30.0–36.0)
MCV: 98.4 fL (ref 80.0–100.0)
Platelets: 288 10*3/uL (ref 150–400)
RBC: 2.44 MIL/uL — ABNORMAL LOW (ref 3.87–5.11)
RDW: 16.1 % — ABNORMAL HIGH (ref 11.5–15.5)
WBC: 18.2 10*3/uL — ABNORMAL HIGH (ref 4.0–10.5)
nRBC: 0 % (ref 0.0–0.2)

## 2018-04-04 LAB — OCCULT BLOOD X 1 CARD TO LAB, STOOL: Fecal Occult Bld: POSITIVE — AB

## 2018-04-04 NOTE — Plan of Care (Signed)
  Problem: Consults Goal: RH STROKE PATIENT EDUCATION Description See Patient Education module for education specifics  Outcome: Progressing Goal: Nutrition Consult-if indicated Outcome: Progressing   Problem: RH BOWEL ELIMINATION Goal: RH STG MANAGE BOWEL WITH ASSISTANCE Description STG Manage Bowel with mod Assistance.  Outcome: Progressing   Problem: RH SKIN INTEGRITY Goal: RH STG SKIN FREE OF INFECTION/BREAKDOWN Description Patients skin will remain free from further infection or breakdown during rehab stay with mod assist.  Outcome: Progressing Goal: RH STG MAINTAIN SKIN INTEGRITY WITH ASSISTANCE Description STG Maintain Skin Integrity With mod Assistance.  Outcome: Progressing Goal: RH STG ABLE TO PERFORM INCISION/WOUND CARE W/ASSISTANCE Description STG Able To Perform Incision/Wound Care With total Assistance from caregiver.  Outcome: Progressing   Problem: RH SAFETY Goal: RH STG ADHERE TO SAFETY PRECAUTIONS W/ASSISTANCE/DEVICE Description STG Adhere to Safety Precautions With min Assistance/Device.  Outcome: Progressing   Problem: RH KNOWLEDGE DEFICIT Goal: RH STG INCREASE KNOWLEDGE OF HYPERTENSION Description Min assist.  Outcome: Progressing Goal: RH STG INCREASE KNOWLEDGE OF DYSPHAGIA/FLUID INTAKE Description Min assist  Outcome: Progressing Goal: RH STG INCREASE KNOWLEGDE OF HYPERLIPIDEMIA Description Min assist  Outcome: Progressing Goal: RH STG INCREASE KNOWLEDGE OF STROKE PROPHYLAXIS Description Min assist  Outcome: Progressing   Problem: RH BLADDER ELIMINATION Goal: RH STG MANAGE BLADDER WITH ASSISTANCE Description STG Manage Bladder With mod Assistance  Outcome: Not Progressing

## 2018-04-04 NOTE — Progress Notes (Addendum)
Physical Therapy Session Note  Patient Details  Name: Maria Avila MRN: [id number] Date of Birth: [birth year]  Today's Date: 04/04/2018 PT Individual Time: 1400-1455 PT Individual Time Calculation (min): 55 min   Short Term Goals: Week 3:  PT Short Term Goal 1 (Week 3): STG = LTG due to anticipated d/c date.  Skilled Therapeutic Interventions/Progress Updates:   Pt in supine and agreeable to therapy, no c/o pain. Transferred to EOB w/ min assist and tactile cues for technique. Performed max assist stedy transfer to w/c to minimize sheering forces on sacrum w/ SB, +2 needed 2/2 decreased initiation and command following in first part of session. Worked on sit<>stands x3 in standing frame w/ max assist x1 and max verbal, manual, and tactile cues for initiation, technique, and for grading of movement. Needed step-by-step cues to perform transfer w/ increased time. Maintained static stance for up to 60 sec at a time w/o use of standing frame harness, min tactile and verbal cues to maintain upright posture and hip extension. Performed kinetron @ 30 cm/sec while seated in w/c to work on LE limb dissociation, muscle activation, and strength training. Min verbal and manual cues to initiate and to maintain neutral LE alignment. 1 min on kinetron x4 reps. Returned to room and practiced stand pivot transfer to EOB to assess for use to get to/from car upon d/c as pt's husband refusing non-emergent ambulance transport at this time. Min x1 physical assist needed to perform the transfer, however max verbal, tactile, and manual cues for initiation, technique, and safety. Ended session in supine, pt refused sidelying positioning for pressure relief. In care of NT and all needs met.   Therapy Documentation Precautions:  Precautions Precautions: Fall Precaution Comments: apraxic, somewhat impulsive Restrictions Weight Bearing Restrictions: No Pain: Pain Assessment Pain Scale: 0-10 Pain Score: (reports  better) Pain Type: Acute pain Pain Location: Abdomen Pain Descriptors / Indicators: Aching Pain Onset: On-going Pain Intervention(s): Medication (See eMAR)  Therapy/Group: Individual Therapy  Shenica Holzheimer Clent Demark 04/04/2018, 2:57 PM

## 2018-04-04 NOTE — Progress Notes (Signed)
Patient ID: Maria Avila, female   DOB: 08-06-1950, 67 y.o.   MRN: [id number]   Maria Avila is a 67 y.o. female who is admitted for CIR with functional deficits following a right hemispheric stroke left side affected greater than right  Past Medical History:  Diagnosis Date  . Colitis   . Diabetes mellitus without complication ([place])   . Hypertension   . Malnutrition ([place])      Subjective: No new complaints. No new problems. Slept well.  Complaining of some slight increased stool frequency  Objective: Vital signs in last 24 hours: Temp:  [98.2 F (36.8 C)-99.2 F (37.3 C)] 99.2 F (37.3 C) (12/01 0524) Pulse Rate:  [102-103] 102 (12/01 0524) Resp:  [14-18] 18 (12/01 0524) BP: (102-122)/(66-74) 108/72 (12/01 0524) SpO2:  [99 %-100 %] 99 % (12/01 0524) Weight change:  Last BM Date: 04/04/18  Intake/Output from previous day: 11/30 0701 - 12/01 0700 In: 480 [P.O.:480] Out: 600 [Urine:600]   Physical Exam General: No apparent distress   HEENT: Clear Lungs: Normal effort. Lungs clear to auscultation, no crackles or wheezes. Cardiovascular: Regular rate and rhythm, no edema.  Pulse rate 100 Abdomen: S/NT/ND; BS(+) Musculoskeletal:  unchanged Neurological: No new neurological deficits Extremities.  No significant edema Skin: clear   Mental state: Alert, oriented, cooperative    Lab Results: BMET    Component Value Date/Time   NA 141 03/26/2018 0424   K 3.9 03/26/2018 0424   CL 113 (H) 03/26/2018 0424   CO2 24 03/26/2018 0424   GLUCOSE 79 03/26/2018 0424   BUN 17 03/26/2018 0424   CREATININE 0.74 03/26/2018 0424   CREATININE 0.78 07/21/2017 1131   CALCIUM 8.5 (L) 03/26/2018 0424   GFRNONAA >60 03/26/2018 0424   GFRNONAA >60 07/21/2017 1131   GFRAA >60 03/26/2018 0424   GFRAA >60 07/21/2017 1131   CBC    Component Value Date/Time   WBC 18.2 (H) 04/04/2018 0351   RBC 2.44 (L) 04/04/2018 0351   HGB 7.6 (L) 04/04/2018 0351   HGB 11.3 (L) 07/21/2017 1131   HCT 24.0 (L) 04/04/2018 0351   HCT 23.7 (L) 02/17/2018 0205   PLT 288 04/04/2018 0351   PLT 186 07/21/2017 1131   MCV 98.4 04/04/2018 0351   MCH 31.1 04/04/2018 0351   MCHC 31.7 04/04/2018 0351   RDW 16.1 (H) 04/04/2018 0351   LYMPHSABS 4.7 (H) 04/03/2018 0412   MONOABS 1.5 (H) 04/03/2018 0412   EOSABS 0.1 04/03/2018 0412   BASOSABS 0.0 04/03/2018 0412    Studies/Results: Dg Chest 2 View  Result Date: 04/02/2018 CLINICAL DATA:  Leukocytosis. EXAM: CHEST - 2 VIEW COMPARISON:  03/14/2018 FINDINGS: Lungs are adequately inflated with subtle residual density over the lateral left midlung likely atelectasis. No evidence of effusion. Cardiomediastinal silhouette and remainder of the exam is unchanged. IMPRESSION: No active cardiopulmonary disease. Electronically Signed   By: Marin Olp M.D.   On: 04/02/2018 17:01    Medications: I have reviewed the patient's current medications.  Assessment/Plan:  Functional deficits following bihemispheric infarctions.  Continue CIR Anemia.  Hemoglobin continues to trend down and is 7.6 today.  We will continue to monitor for GI bleed and follow-up CBC in the morning History of empyema History of hypokalemia    Length of stay, days: 18  Marletta Lor , MD 04/04/2018, 9:27 AM

## 2018-04-05 ENCOUNTER — Inpatient Hospital Stay (HOSPITAL_COMMUNITY): Payer: Self-pay | Admitting: Occupational Therapy

## 2018-04-05 ENCOUNTER — Inpatient Hospital Stay (HOSPITAL_COMMUNITY): Payer: Self-pay

## 2018-04-05 LAB — CBC
HCT: 24.8 % — ABNORMAL LOW (ref 36.0–46.0)
Hemoglobin: 7.7 g/dL — ABNORMAL LOW (ref 12.0–15.0)
MCH: 30.8 pg (ref 26.0–34.0)
MCHC: 31 g/dL (ref 30.0–36.0)
MCV: 99.2 fL (ref 80.0–100.0)
Platelets: 301 10*3/uL (ref 150–400)
RBC: 2.5 MIL/uL — ABNORMAL LOW (ref 3.87–5.11)
RDW: 16 % — ABNORMAL HIGH (ref 11.5–15.5)
WBC: 10.7 10*3/uL — ABNORMAL HIGH (ref 4.0–10.5)
nRBC: 0 % (ref 0.0–0.2)

## 2018-04-05 LAB — BASIC METABOLIC PANEL
Anion gap: 6 (ref 5–15)
BUN: 19 mg/dL (ref 8–23)
CO2: 26 mmol/L (ref 22–32)
Calcium: 8.8 mg/dL — ABNORMAL LOW (ref 8.9–10.3)
Chloride: 111 mmol/L (ref 98–111)
Creatinine, Ser: 0.6 mg/dL (ref 0.44–1.00)
GFR calc non Af Amer: >60 mL/min (ref >60)
Glucose, Bld: 92 mg/dL (ref 70–99)
Potassium: 3.5 mmol/L (ref 3.5–5.1)
Sodium: 143 mmol/L (ref 135–145)

## 2018-04-05 LAB — URINE CULTURE

## 2018-04-05 LAB — OCCULT BLOOD X 1 CARD TO LAB, STOOL: Fecal Occult Bld: NEGATIVE

## 2018-04-05 MED ORDER — BETHANECHOL CHLORIDE 10 MG PO TABS
5.0000 mg | ORAL_TABLET | Freq: Three times a day (TID) | ORAL | Status: DC
  Administered 2018-04-05 – 2018-04-07 (×7): 5 mg via ORAL
  Filled 2018-04-05 (×7): qty 1

## 2018-04-05 NOTE — Progress Notes (Signed)
Occupational Therapy Session Note  Patient Details  Name: Maria Avila MRN: [id number] Date of Birth: 1950/07/08  Today's Date: 04/05/2018 OT Individual Time: 1100-1200 OT Individual Time Calculation (min): 60 min    Short Term Goals: Week 3:  OT Short Term Goal 1 (Week 3): Pt will don pants with mod assist at bed level 3 out of 4 attempts. OT Short Term Goal 2 (Week 3): Pt will recall sequencing of bed mobility with min cues to decrease burden of care. OT Short Term Goal 3 (Week 3): Pt will complete toilet transfer with max assist   Skilled Therapeutic Interventions/Progress Updates:    OT intervention with focus on bed mobility, sitting balance, sit<>stand, standing balance, and activity tolerance to increased independence with BADLs and decrease caregiver burden. Pt donned pants at bed level sitting in long sitting.  Pt assisted with pulling pants past knees and attempted donning socks. Pt required mod A for supine>sit EOB in preparation for standing with Stedy to pull pants over hips.  Pt assisted with pulling pants over hips while standing in [place].  Pt transitioned to gym and engaged in dynamic sitting activities reaching for bean bags in all planes-focus on hip flexion, trunk rotation.  Pt defaults to reliance on BUE for support when engaged truck during reaching tasks.  Pt with significant posterior pelvic tilt and unable to initiate transitioning to neutral.  Stretching provided with pt noting that the stretch "felt good." Pt stood X 2 three musketeers style with LLE going into extension and weight supported through RLE. Pt returned to TIS w/c and returned to room.  Pt remained in w/c with all needs within reach, belt alarm activated, and husband present.   Therapy Documentation Precautions:  Precautions Precautions: Fall Precaution Comments: apraxic, somewhat impulsive Restrictions Weight Bearing Restrictions: No  Pain: Pain Assessment Pain Score: 0-No pain   Therapy/Group:  Individual Therapy  Leroy Libman 04/05/2018, 12:06 PM

## 2018-04-05 NOTE — Progress Notes (Signed)
Physical Therapy Wound Treatment Patient Details  Name: Maria Avila MRN: [id number] Date of Birth: 1950/06/14  Today's Date: 04/05/2018 PT Individual Time: 4580-9983 PT Individual Time Calculation (min): 20 min   Subjective  Subjective: Pt reports, "Not too good," when asked how her appetite is. Patient and Family Stated Goals: Pt not stated Prior Treatments: Dressing changes  Pain Score:  Denies pain with dressing change.  Wound Assessment  Pressure Injury 03/22/18 Unstageable - Full thickness tissue loss in which the base of the ulcer is covered by slough (yellow, tan, gray, green or brown) and/or eschar (tan, brown or black) in the wound bed. Patient had MASD POA, patient continues to be  (Active)  Dressing Type Gauze (Comment);Foam;Moist to [employer] (skin prep) 04/05/2018  9:19 AM  Dressing Changed;Clean;Dry;Intact 04/05/2018  9:19 AM  Dressing Change Frequency Daily 04/05/2018  9:19 AM  State of Healing Eschar 04/05/2018  9:19 AM  Site / Wound Assessment Pale;Pink;Yellow 04/05/2018  9:19 AM  % Wound base Red or Granulating 50% 04/05/2018  9:19 AM  % Wound base Yellow/Fibrinous Exudate 50% 04/05/2018  9:19 AM  % Wound base Black/Eschar 0% 04/05/2018  9:19 AM  % Wound base Other/Granulation Tissue (Comment) 0% 04/05/2018  9:19 AM  Peri-wound Assessment Maceration 04/05/2018  9:19 AM  Wound Length (cm) 5.5 cm 03/29/2018  9:00 AM  Wound Width (cm) 8 cm 03/29/2018  9:00 AM  Wound Depth (cm) 1.5 cm 03/29/2018  9:00 AM  Wound Surface Area (cm^2) 44 cm^2 03/29/2018  9:00 AM  Wound Volume (cm^3) 66 cm^3 03/29/2018  9:00 AM  Undermining (cm) 1 to 1.5 cm from 7 o'clock to 2 o'clock  03/29/2018  9:00 AM  Margins Unattached edges (unapproximated) 04/05/2018  9:19 AM  Drainage Amount Minimal 04/05/2018  9:19 AM  Drainage Description Serous 04/05/2018  9:19 AM  Treatment Debridement (Selective);Hydrotherapy (Pulse lavage);Packing (Saline gauze) 04/05/2018  9:19 AM   Santyl applied to wound bed  prior to applying dressing.    Hydrotherapy Pulsed lavage therapy - wound location: sacrum Pulsed Lavage with Suction (psi): 8 psi(8-12) Pulsed Lavage with Suction - Normal Saline Used: 1000 mL Pulsed Lavage Tip: Tip with splash shield Selective Debridement Selective Debridement - Location: sacrum Selective Debridement - Tools Used: Forceps;Scissors Selective Debridement - Tissue Removed: very minimal yellow necrotic tissue   Wound Assessment and Plan  Wound Therapy - Assess/Plan/Recommendations Wound Therapy - Clinical Statement: Pt with very pale wound bed. Only able to remove miniscule amount of necrotic tissue with selective debridement. Believe hydrotherapy is reaching maximal benefit soon. Have scheduled to see with WOCN on Wednesday for likely dc of hydrotherapy and further recommendations for wound care. Suspect pt's nutritional status is contributing to slow progress with wound.  Wound Therapy - Functional Problem List: decr mobility and sitting tolerance Factors Delaying/Impairing Wound Healing: Incontinence;Immobility;Multiple medical problems;Polypharmacy;Diabetes Mellitus Hydrotherapy Plan: Debridement;Dressing change;Patient/family education;Pulsatile lavage with suction Wound Therapy - Frequency: 3X / week Wound Therapy - Follow Up Recommendations: Home health RN Wound Plan: See above  Wound Therapy Goals- Improve the function of patient's integumentary system by progressing the wound(s) through the phases of wound healing (inflammation - proliferation - remodeling) by: Decrease Necrotic Tissue to: 40 Decrease Necrotic Tissue - Progress: Progressing toward goal Increase Granulation Tissue to: 60 Increase Granulation Tissue - Progress: Progressing toward goal  Goals will be updated until maximal potential achieved or discharge criteria met.  Discharge criteria: when goals achieved, discharge from hospital, MD decision/surgical intervention, no progress towards goals,  refusal/missing  three consecutive treatments without notification or medical reason.  GP     Shary Decamp [facility] 04/05/2018, 9:49 AM [place] Pager [phone] Office [phone]

## 2018-04-05 NOTE — Progress Notes (Signed)
Speech Language Pathology Daily Session Note  Patient Details  Name: Maria Avila MRN: [id number] Date of Birth: 30-Jul-1950  Today's Date: 04/05/2018 SLP Individual Time: 0801-0858 SLP Individual Time Calculation (min): 57 min  Short Term Goals: Week 3: SLP Short Term Goal 1 (Week 3): Pt will complete basic problem solving tasks with Mod A cues.  SLP Short Term Goal 2 (Week 3): Pt will sustain attention to basic task for ~ 15 minutes with Min A cues.  SLP Short Term Goal 3 (Week 3): Pt will locate month and day of week on calendar with Mod A cues in 7 out of 10 opportunties.  SLP Short Term Goal 4 (Week 3): Pt will follow 1 step directions related to ADLs/transfers with Mod A cues in 5 out of 10 opportunities.   Skilled Therapeutic Interventions:Skilled ST services focused on cognitive skills. SLP facilitated orientation of place and time, pt required initially required max A verbal cues for use of external aids fading to mod A verbal cues by the end of the session. Pt demonstrated auditory comprehension difficulties reponding with numbers when asked for the month.  SLP facilitated basic problem solving skills sorting objects by colors (red, blue, green and yellow) required mod A verbal cues, identifying if an object was a letter verse number min A verbal cues and sort objects by category (letter/number) max A verbal cues. Pt demonstrated increase in comprehension of orientation responding with requested number/word (year/moth) following problem solving activity of sorting letters and numbers. Pt required min A verbal cues for sustained attention in 10 minute intervals. Pt was left in room with call bell within reach and bed alarm set. SLP reccomends to continue skilled services.     Pain Pain Assessment Pain Score: 0-No pain  Therapy/Group: Individual Therapy  Shawntay Prest  [facility] 04/05/2018, 10:44 AM

## 2018-04-05 NOTE — Progress Notes (Signed)
Patient complained of stomach pain with a need to have a bowel movement at 0345. Patient was placed on stedy and transferred to the toilet by RN and NT. At 0350, NT reported to RN that the patient "leaned to the left and started having tremors to the arms and hands as if she were having a seizure". Vitals were taken by the RN at 0348 while patient was sitting on toilet. Vitals are as follows: Temp: 98.3 F Temp Source: Oral BP: 119/70 BP Location: Left Arm BP Method: Automatic Patient Position: Sitting  Pulse Rate: 130 Resp: 24 SpO2: 99 O2 Device: Room Air  Patient was transferred to bed and vitals were taken again by the NT at 0404. Vitals are as follows: Temp: 98.1 F Temp Source: Oral BP: 108/73 BP Location: Right Wrist BP Method: Automatic Patient Position: Lying Pulse Rate: 102 Resp: 20 SpO2: 100 O2 Device: Room Air   Patient states that she feels weak. Patient is asymptomatic. RN will continue to monitor.  Eliseo Squires 04/05/2018 [phone]

## 2018-04-05 NOTE — Progress Notes (Signed)
Physical Therapy Session Note  Patient Details  Name: Maria Avila MRN: [id number] Date of Birth: [birth year]  Today's Date: 04/05/2018 PT Individual Time: 1330-1430 PT Individual Time Calculation (min): 60 min   Short Term Goals: Week 3:  PT Short Term Goal 1 (Week 3): STG = LTG due to anticipated d/c date.  Skilled Therapeutic Interventions/Progress Updates:    Pt seated in TIS w/c upon PT arrival, agreeable to therapy tx and denies pain at rest. Pt's husband present for familiy education the entire session. Pt's husband transported pt throughout unit this session dependent in TIS w/c. Therapist educated husband on w/c parts including how to move leg and arm rests, review tilting schedule. Therapist educated pt and husband on slideboard transfer only to be used when necessary and to limit this type on transfer at home secondary to LE tone and Sacral skin wounds. Pt performed slideboard transfer from w/c<>mat with mod assist from therapist and then x 1 transfer with assist from husband, min cueing from therapist for techniques. PT instructed husband in manual hoyer lift transfer, therapist and husband together performed hoyer transfer from w/c<>mat, dependent. Therapist instructed husband how to don/doff sling appropriately and how to use hoyer lift. Pt's husband continues to decline non-emergency ambulance transport home, he reports she will be going in toyota rav4 (27.5 inch). Therapist performed stand pivot transfer from w/c<>car with max assist. slideboard created to steep of an angle for safe car transfer. Therapist recommended that if the pt goes home in a car they will need +2 people to perform transfer, therapist emphasized that this is not the safest method compared to transport home. Pt transported back to room and left seated with needs in reach, chair alarm set.   Therapy Documentation Precautions:  Precautions Precautions: Fall Precaution Comments: apraxic, somewhat  impulsive Restrictions Weight Bearing Restrictions: No   Therapy/Group: Individual Therapy  Netta Corrigan , PT, DPT 04/05/2018, 7:51 AM

## 2018-04-05 NOTE — Progress Notes (Signed)
Occupational Therapy Session Note  Patient Details  Name: Maria Avila MRN: [id number] Date of Birth: 01-13-1951  Today's Date: 04/05/2018 OT Individual Time: 1435-1500 OT Individual Time Calculation (min): 25 min    Short Term Goals: Week 3:  OT Short Term Goal 1 (Week 3): Pt will don pants with mod assist at bed level 3 out of 4 attempts. OT Short Term Goal 2 (Week 3): Pt will recall sequencing of bed mobility with min cues to decrease burden of care. OT Short Term Goal 3 (Week 3): Pt will complete toilet transfer with max assist   Skilled Therapeutic Interventions/Progress Updates:    Pt seen for OT session focusing on caregiver education and functional transfers. Pt sitting in tilt-in-space w/c upon arrival, husband present for family education. Pt desiring to return to bed.  Therapist assisted with sliding board transfer back to bed, extensive cuing and education provided throughout session to husband for technique and method of sliding board transfer. Pt able to assist with transfer, however, difficulty functionally assisting 2/2 apraxia and motor planning deficits. Education provided to husband regarding apraxia and functional implications.  From supine position, pt's husband assisted with clothing management, brief change and hygiene. All completed with total A, pt rolling with min-mod A with max cuing during task. Education provided to husband for importance of maintaining proper body mechanics, hospital bed functions, etc. Pt left in supine at end of session with bed rails up, husband present and telesitter activated.   Therapy Documentation Precautions:  Precautions Precautions: Fall Precaution Comments: apraxic, somewhat impulsive Restrictions Weight Bearing Restrictions: No Pain:   No/denies pain   Therapy/Group: Individual Therapy  Alaura Schippers L 04/05/2018, 3:17 PM

## 2018-04-05 NOTE — Progress Notes (Signed)
[facility] PHYSICAL MEDICINE & REHABILITATION PROGRESS NOTE   Subjective/Complaints:  WBCs down, afebrile, Hgb stable, denies abd pain, some pain when she defecates "skin raw"  ROSNo CP, SOB, N/V/D, eating breakfast  Objective:   No results found. Recent Labs    04/04/18 0351 04/05/18 0429  WBC 18.2* 10.7*  HGB 7.6* 7.7*  HCT 24.0* 24.8*  PLT 288 301   Recent Labs    04/05/18 0429  NA 143  K 3.5  CL 111  CO2 26  GLUCOSE 92  BUN 19  CREATININE 0.60  CALCIUM 8.8*    Intake/Output Summary (Last 24 hours) at 04/05/2018 0721 Last data filed at 04/05/2018 0554 Gross per 24 hour  Intake 680 ml  Output 775 ml  Net -95 ml     Physical Exam: Vital Signs Blood pressure 108/73, pulse (!) 102, temperature 98.1 F (36.7 C), temperature source Oral, resp. rate 20, weight 52 kg, SpO2 100 %. Constitutional: No distress . Vital signs reviewed. HENT: Normocephalic.  Atraumatic. Eyes: EOMI. No discharge. Cardiovascular: RRR.  No JVD. Respiratory: crackles at Left base no resp distress, no wheezes GI: BS +. Non-distended. Musculoskeletal: She exhibits bilateral lower extremity edema Neurological: She is alert and oriented x1.  Delayed processing.  Motor: 4/5 bue and ble  Skin: Sacral wound dressed.  Psychiatric: Restless.  Confused.  Assessment/Plan: 1. 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  Physiatrist is providing close team supervision and 24 hour management of active medical problems listed below.  Physiatrist and rehab team continue to assess barriers to discharge/monitor patient progress toward functional and medical goals  Care Tool:  Bathing              Bathing assist Assist Level: 2 Helpers(per reports)     Upper Body Dressing/Undressing Upper body dressing   What is the patient wearing?: Pull over shirt    Upper body assist  Assist Level: Moderate Assistance - Patient 50 - 74%    Lower Body Dressing/Undressing Lower body dressing      What is the patient wearing?: Pants     Lower body assist Assist for lower body dressing: Moderate Assistance - Patient 50 - 74%     Toileting Toileting    Toileting assist Assist for toileting: 2 Helpers     Transfers Chair/bed transfer  Transfers assist     Chair/bed transfer assist level: Dependent - mechanical lift     Locomotion Ambulation   Ambulation assist   Ambulation activity did not occur: Safety/medical concerns(apraxia)  Assist level: 2 helpers Assistive device: Other (comment)(3 muskateers ) Max distance: 64ft   Walk 10 feet activity   Assist     Assist level: 2 helpers Assistive device: Lite Gait   Walk 50 feet activity   Assist           Walk 150 feet activity   Assist           Walk 10 feet on uneven surface  activity   Assist           Wheelchair     Assist Will patient use wheelchair at discharge?: Yes Type of Wheelchair: Manual    Wheelchair assist level: Maximal Assistance - Patient 25 - 49% Max wheelchair distance: 15    Wheelchair 50 feet with 2 turns activity    Assist    Wheelchair 50 feet with 2 turns activity did not occur:  Safety/medical concerns(fatigue)   Assist Level: Minimal Assistance - Patient > 75%   Wheelchair 150 feet activity     Assist Wheelchair 150 feet activity did not occur: Safety/medical concerns(fatigue)         Medical Problem List and Plan: 1.  Limitations in mobility, self-care, endurance secondary to L>R infarcts and multiple medical issues.  -Continue CIR PT, OT, SLP- goal of return to home next wk.  2.  BLE DVTs/Anticoagulation: Pharmaceutical: Other (comment)-on Eliquis 3. Pain Management: tylenol prn.  4. Mood: LCSW to follow for evaluation and support.  5. Neuropsych: This patient is not capable of making decisions on her own behalf. 6.  Skin/Wound Care: Air mattress due to sacral breakdown.   -hydrotherapy per PT- resume today , will not have this at home will cont sanyl collagenase vs W to D sterile dressing at home 7. Fluids/Electrolytes/Nutrition:  Continue dysphagia 3 thin diet. - may d/c CBG Off TNA. I~622ml po  BMP within acceptable range on 11/16-  8. Empyema:off abx afebrile, no SOB 9. Malnutrition/Heavy alcohol use: Poor intake with 40 lbs weight loss.   -B12 and folate levels normal 10. Diarrhea: Added probiotic and fiber. Discontinued Ensure and added prostat.  Stools still mushy with incont  Overall improved, ? GI blood loss, one stool + and one stool neg  -added imodium Reviewed chart has had workup with GI for abd pain, see MD at [facility] , CT abd showed non specific enteral wall thickening 11. Hypokalemia: Recheck on 11/22, normal  13.  Acute blood loss anemia  Hb 9.0 on 11/16 , 7.9  11/22- 8.3 11/25, no need for transfusion Recheck today stable   14.  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  Urinary retention,still requiring  ICP, on  Flomax, increase  Urecholine, no further caths, reduce urecholine and monitor for retention 16.  Agitation improved will reduce seroquel LOS: 19 days A FACE TO FACE EVALUATION WAS PERFORMED  Charlett Blake 04/05/2018, 7:21 AM

## 2018-04-06 ENCOUNTER — Inpatient Hospital Stay (HOSPITAL_COMMUNITY): Payer: Self-pay | Admitting: *Deleted

## 2018-04-06 ENCOUNTER — Inpatient Hospital Stay (HOSPITAL_COMMUNITY): Payer: Self-pay

## 2018-04-06 ENCOUNTER — Inpatient Hospital Stay (HOSPITAL_COMMUNITY): Payer: Self-pay | Admitting: Occupational Therapy

## 2018-04-06 LAB — OCCULT BLOOD X 1 CARD TO LAB, STOOL: Fecal Occult Bld: NEGATIVE

## 2018-04-06 NOTE — Progress Notes (Signed)
Nutrition Follow-up  DOCUMENTATION CODES:   Non-severe (moderate) malnutrition in context of chronic illness  INTERVENTION:   - Continue MVI with minerals daily  - Continue Pro-stat 30 ml BID, each supplement provides 100 kcal and 15 grams of protein  - Lactaid milk daily with breakfast meal  - Magic cup BID with meals, each supplement provides 290 kcal and 9 grams of protein  - Provided education regarding high-protein food options to aid in wound healing after d/c  NUTRITION DIAGNOSIS:   Moderate Malnutrition related to chronic illness (chronic colitis) as evidenced by mild muscle depletion, moderate muscle depletion, mild fat depletion, moderate fat depletion.  Ongoing  GOAL:   Patient will meet greater than or equal to 90% of their needs  Progressing  MONITOR:   PO intake, Supplement acceptance, Diet advancement, I & O's, Weight trends, Labs, Skin  REASON FOR ASSESSMENT:   Malnutrition Screening Tool, Consult Calorie Count  ASSESSMENT:   67 year old female with PMH significant for T2DM, HTN, heavy alcohol use, lymphocytosis/leucopenia, and colitis with 6 month history of N/V/D and anorexia with 40 lb weight loss and abdominal pain. Pt was admitted on 02/16/18 with negative extensive work-up and was discharged to SNF for rehab. Pt was readmitted on 03/04/18 with AMS, hypoxia, and tachycardia due to sepsis with near complete opacification of left chest due to empyema. Pt was intubated and left chest tube placed with drainage of purulent material. Pt started on IV antibiotics. Pt extubated 11/4. Gastrografin swallow negative for perforation, empyema felt likely due to aspiration PNA. Brain MRI reviewed showing mulitple CVAs. Chest tube treated with tPA for thrombolysis and to help with reexpansion of lung and discontinued on 11/9 as non-functional. Pt received TPN during previous admission (11/2-11/9).  11/18 - per [facility] note, pt with unstageable pressure injury to  sacrum 11/20 - hydrotherapy initiated (MWF)  Weight stable since 11/22, up 1 lb.  Spoke with pt and husband at bedside. Pt eating lunch at time of visit. Pt reports that her appetite is "so-so." Pt's husband states that pt is eating well but did not have much lunch yesterday due to grilled cheese being hard.  Pt states that she is eating at least 1 Magic Cup daily but is worried that they are giving her diarrhea. Will decrease order from TID with meals to BID with meals.  Pt's husband shares that he will be grocery shopping and cooking for pt once she is d/c. Discussed high-protein food options to promote wound healing.  Meal Completion: 50-100% x last 8 meals  Medications reviewed and include: Pepcid, Pro-stat 30 ml BID, lactobacillus TID, Imodium BID, MVI with minerals daily, Fibercon, K-dur 20 mEq BID, thiamine  Labs reviewed: hemoglobin 7.7 (L)   Diet Order:   Diet Order            DIET DYS 3 Room service appropriate? Yes; Fluid consistency: Thin  Diet effective now              EDUCATION NEEDS:   Education needs have been addressed  Skin:  Skin Assessment: Skin Integrity Issues: Stage III: right and left ischial tuberosity Unstageable: sacrum Incisions: open left flank, perineum  Last BM:  12/3 (medium type 6)  Height:   Ht Readings from Last 1 Encounters:  03/10/18 5\' 2"  (1.575 m)    Weight:   Wt Readings from Last 1 Encounters:  02/16/18 48.7 kg    Ideal Body Weight:  50 kg  BMI:  Body mass index is 20.97 kg/m.  Estimated Nutritional Needs:   Kcal:  1900-2100  Protein:  105-120 grams  Fluid:  > 1.9 L    Gaynell Face, MS, RD, LDN Inpatient Clinical Dietitian Pager: [phone] Weekend/After Hours: [phone]

## 2018-04-06 NOTE — Progress Notes (Signed)
Speech Language Pathology Daily Session Note  Patient Details  Name: Maria Avila MRN: [id number] Date of Birth: 1951-02-03  Today's Date: 04/06/2018 SLP Individual Time: 0803-0900 SLP Individual Time Calculation (min): 57 min  Short Term Goals: Week 3: SLP Short Term Goal 1 (Week 3): Pt will complete basic problem solving tasks with Mod A cues.  SLP Short Term Goal 2 (Week 3): Pt will sustain attention to basic task for ~ 15 minutes with Min A cues.  SLP Short Term Goal 3 (Week 3): Pt will locate month and day of week on calendar with Mod A cues in 7 out of 10 opportunties.  SLP Short Term Goal 4 (Week 3): Pt will follow 1 step directions related to ADLs/transfers with Mod A cues in 5 out of 10 opportunities.   Skilled Therapeutic Interventions: Skilled ST services focused on cognitive skills. SLP facilitated orientation to situation, place and time, pt required min A verbal cues for external aids given multiple trials. SLP facilitated basic problem solving skills utilizing 3 step sequence picture cards requiring mod-min A verbal cues and identifying 3 differences among 3 cards, pt required mod A verbal cues.Pt required min A verbal cues for sustained attention in 15 minute intervals. Pt demonstrated slight increase in cognitive ability compared to pervious session. Pt was left with bed alarm set and call bell within reach. Recommend to continue skilled ST services.     Pain Pain Assessment Pain Scale: 0-10 Pain Score: 0-No pain  Therapy/Group: Individual Therapy  Paulo Keimig  [place] 04/06/2018, 12:41 PM

## 2018-04-06 NOTE — Progress Notes (Signed)
[facility] PHYSICAL MEDICINE & REHABILITATION PROGRESS NOTE   Subjective/Complaints:  Pt states she was cathed yesterday, has has low vol caths at times, orders for caths if PVR >350, has been cathed for vol of 160-247ml  ROSNo CP, SOB, N/V/D, eating breakfast  Objective:   No results found. Recent Labs    04/04/18 0351 04/05/18 0429  WBC 18.2* 10.7*  HGB 7.6* 7.7*  HCT 24.0* 24.8*  PLT 288 301   Recent Labs    04/05/18 0429  NA 143  K 3.5  CL 111  CO2 26  GLUCOSE 92  BUN 19  CREATININE 0.60  CALCIUM 8.8*    Intake/Output Summary (Last 24 hours) at 04/06/2018 0746 Last data filed at 04/06/2018 0100 Gross per 24 hour  Intake -  Output 400 ml  Net -400 ml     Physical Exam: Vital Signs Blood pressure 112/82, pulse 92, temperature 98.1 F (36.7 C), resp. rate 17, weight 52 kg, SpO2 100 %. Constitutional: No distress . Vital signs reviewed. HENT: Normocephalic.  Atraumatic. Eyes: EOMI. No discharge. Cardiovascular: RRR.  No JVD. Respiratory: crackles at Left base no resp distress, no wheezes GI: BS +. Non-distended. Musculoskeletal: She exhibits bilateral lower extremity edema Neurological: She is alert and oriented x1.  Delayed processing.  Motor: 4/5 bue and ble  Skin: Sacral wound dressed.  Psychiatric: Restless.  Confused.  Assessment/Plan: 1. 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  Physiatrist is providing close team supervision and 24 hour management of active medical problems listed below.  Physiatrist and rehab team continue to assess barriers to discharge/monitor patient progress toward functional and medical goals  Care Tool:  Bathing              Bathing assist Assist Level: 2 Helpers(per reports)     Upper Body Dressing/Undressing Upper body dressing   What is the patient wearing?: Pull over shirt    Upper  body assist Assist Level: Moderate Assistance - Patient 50 - 74%    Lower Body Dressing/Undressing Lower body dressing      What is the patient wearing?: Pants     Lower body assist Assist for lower body dressing: Maximal Assistance - Patient 25 - 49%     Toileting Toileting    Toileting assist Assist for toileting: 2 Helpers     Transfers Chair/bed transfer  Transfers assist     Chair/bed transfer assist level: 2 Helpers     Locomotion Ambulation   Ambulation assist   Ambulation activity did not occur: Safety/medical concerns(apraxia)  Assist level: 2 helpers Assistive device: Other (comment)(3 muskateers ) Max distance: 41ft   Walk 10 feet activity   Assist     Assist level: 2 helpers Assistive device: Lite Gait   Walk 50 feet activity   Assist           Walk 150 feet activity   Assist           Walk 10 feet on uneven surface  activity   Assist           Wheelchair     Assist Will patient use wheelchair at discharge?: Yes Type of Wheelchair: Manual    Wheelchair assist level: Maximal Assistance - Patient 25 - 49% Max wheelchair distance: 15    Wheelchair 50 feet with 2 turns activity    Assist    Wheelchair 50 feet with 2 turns  activity did not occur: Safety/medical concerns(fatigue)   Assist Level: Minimal Assistance - Patient > 75%   Wheelchair 150 feet activity     Assist Wheelchair 150 feet activity did not occur: Safety/medical concerns(fatigue)         Medical Problem List and Plan: 1.  Limitations in mobility, self-care, endurance secondary to L>R infarcts and multiple medical issues.  -Continue CIR PT, OT, SLP-team conf in am  2.  BLE DVTs/Anticoagulation: Pharmaceutical: Other (comment)-on Eliquis 3. Pain Management: tylenol prn.  4. Mood: LCSW to follow for evaluation and support.  5. Neuropsych: This patient is not capable of making decisions on her own behalf. 6. Skin/Wound Care: Air  mattress due to sacral breakdown.   -hydrotherapy per PT- resume today , will not have this at home will cont sanyl collagenase vs W to D sterile dressing at home 7. Fluids/Electrolytes/Nutrition:  Continue dysphagia 3 thin diet. - may d/c CBG Off TNA. I~652ml po  BMP within acceptable range on 11/16-  8. Empyema:off abx afebrile, no SOB 9. Malnutrition/Heavy alcohol use: Poor intake with 40 lbs weight loss.   -B12 and folate levels normal 10. Diarrhea: Added probiotic and fiber. Discontinued Ensure and added prostat.  Stools still mushy with incont  Overall improved, ? GI blood loss, one stool + and one stool neg  -added imodium Reviewed chart has had workup with GI for abd pain, see MD at [facility] , CT abd showed non specific enteral wall thickening 11. Hypokalemia: Recheck on 11/22, normal  13.  Acute blood loss anemia  Hb 9.0 on 11/16 , 7.9  11/22- 8.3 11/25, no need for transfusion Recheck today stable   14.  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  Urinary retention,still requiring  ICP, on  Flomax, increase  Urecholine, no further caths, reduce urecholine and monitor for retention 16.  Agitation improved will reduce seroquel LOS: 20 days A FACE TO FACE EVALUATION WAS PERFORMED  Charlett Blake 04/06/2018, 7:46 AM

## 2018-04-06 NOTE — Progress Notes (Signed)
Physical Therapy Session Note  Patient Details  Name: Maria Avila MRN: [id number] Date of Birth: May 13, 1950  Today's Date: 04/06/2018 PT Individual Time: 0903-1000 PT Individual Time Calculation (min): 57 min   Short Term Goals: Week 3:  PT Short Term Goal 1 (Week 3): STG = LTG due to anticipated d/c date.  Skilled Therapeutic Interventions/Progress Updates: Pt presented in bed completing breakfast and agreeable to therapy. Pt denies pain at present. PTA donned brief, TED hose, and socks total A with pt performing rolling L/R with minA at hips to complete roll. Pt was able to appropriately follow commands reach for bed rail and lift leg when requested. Pt noted to not have incontinence prior to therapy. Pt performed supine to sit with HOB raised to approx 60 degrees with minA. Pt required verbal cues for hand placement reaching for bed rail. Performed Stedy transfer to [place] chair with pt able to perform STS from [facility] with CGA. Pt transported to day room with pt participating in standing frame for standing tolerance and BLE strengthening. First bout pt noted to have increased L lateral lean and forward flexed posture. Pt required moderate tactile cues for correction to midline. PTA loosened strap and pt was able to maintain midline posture without assistance. After seated rest, performed second bout of standing with pt participating in game of Connect 4. Pt was able to maintain midline while playing game. Pt was able to follow task of taking turns however required moderate cues when asked to locate 4 in a row. Pt also able to perform mini-squats x 8 with knees bending to approx 50 degrees.  Pt's brief checked for incontinence prior to sitting and noted to be clear. Pt return to TIS and pt returned to room. Pt remained in TIS with belt alarm on, call bell within reach and NT notified as husband not present.         Therapy Documentation Precautions:  Precautions Precautions: Fall Precaution  Comments: apraxic, somewhat impulsive Restrictions Weight Bearing Restrictions: No General:   Vital Signs:     Therapy/Group: Individual Therapy  Tayleigh Wetherell  Orma Cheetham, PTA  04/06/2018, 10:40 AM

## 2018-04-06 NOTE — Progress Notes (Signed)
Occupational Therapy Session Note  Patient Details  Name: Maria Avila MRN: [id number] Date of Birth: 06/29/1950  Today's Date: 04/06/2018 OT Individual Time: 8101-7510 OT Individual Time Calculation (min): 70 min    Short Term Goals: Week 3:  OT Short Term Goal 1 (Week 3): Pt will don pants with mod assist at bed level 3 out of 4 attempts. OT Short Term Goal 2 (Week 3): Pt will recall sequencing of bed mobility with min cues to decrease burden of care. OT Short Term Goal 3 (Week 3): Pt will complete toilet transfer with max assist   Skilled Therapeutic Interventions/Progress Updates:    Treatment session with focus on bed mobility and education on transfers with hoyer lift.  Pt received supine in bed willing to engage in therapy session.  Pt's husband present for education.  Pt's husband able to assist with rolling pt to place sling for hoyer use.  Therapist provided mod cues for proper positioning as husband attempted to sit pt up to position sling, educated on proper placement of sling and completing by rolling.  Reiterated importance of crossing leg portion of the sling.  Educated on tilting w/c backwards to allow for improved positioning of pt when transferring to w/c.  Completed squat pivot transfers w/c > therapy mat with pt demonstrating increased weight shift, but still requiring max multimodal cues for weight shift and max assist for transfer.  Engaged in sit > stand x3 from edge of mat with focus on anterior weight shift and increased positioning of BLE.  Stand pivot transfer back to w/c mod assist with improved weight shift for sit > stand and then pivot vs squat pivot from a motor planning stand point.  Pt returned to bed via hoyer lift with pt's husband applying sling with only min cues and then able to complete transfer to bed with min cues from therapist.  Therapy Documentation Precautions:  Precautions Precautions: Fall Precaution Comments: apraxic, somewhat  impulsive Restrictions Weight Bearing Restrictions: No General:   Vital Signs: Therapy Vitals Temp: 98.9 F (37.2 C) Temp Source: Oral Pulse Rate: (!) 101 Resp: 17 BP: 103/65 Patient Position (if appropriate): Lying Oxygen Therapy O2 Device: Room Air Pain:  Pt with c/o pain in buttocks when seated on therapy mat.  Repositioned.    Therapy/Group: Individual Therapy  Simonne Come 04/06/2018, 6:58 PM

## 2018-04-07 ENCOUNTER — Inpatient Hospital Stay (HOSPITAL_COMMUNITY): Payer: Self-pay

## 2018-04-07 ENCOUNTER — Inpatient Hospital Stay (HOSPITAL_COMMUNITY): Payer: Self-pay | Admitting: Occupational Therapy

## 2018-04-07 ENCOUNTER — Inpatient Hospital Stay (HOSPITAL_COMMUNITY): Payer: Self-pay | Admitting: Physical Therapy

## 2018-04-07 LAB — CULTURE, BLOOD (ROUTINE X 2)
CULTURE: NO GROWTH
Culture: NO GROWTH
SPECIAL REQUESTS: ADEQUATE
Special Requests: ADEQUATE

## 2018-04-07 MED ORDER — ZINC SULFATE 220 (50 ZN) MG PO CAPS
220.0000 mg | ORAL_CAPSULE | Freq: Every day | ORAL | Status: DC
  Administered 2018-04-07 – 2018-04-09 (×3): 220 mg via ORAL
  Filled 2018-04-07 (×3): qty 1

## 2018-04-07 MED ORDER — TRAMADOL HCL 50 MG PO TABS
50.0000 mg | ORAL_TABLET | Freq: Once | ORAL | Status: AC
  Administered 2018-04-07: 50 mg via ORAL
  Filled 2018-04-07: qty 1

## 2018-04-07 MED ORDER — QUETIAPINE FUMARATE 25 MG PO TABS
12.5000 mg | ORAL_TABLET | Freq: Every day | ORAL | Status: DC
  Administered 2018-04-07: 12.5 mg via ORAL
  Filled 2018-04-07: qty 1

## 2018-04-07 MED ORDER — BETHANECHOL CHLORIDE 10 MG PO TABS
10.0000 mg | ORAL_TABLET | Freq: Three times a day (TID) | ORAL | Status: DC
  Administered 2018-04-07 – 2018-04-09 (×6): 10 mg via ORAL
  Filled 2018-04-07 (×6): qty 1

## 2018-04-07 MED ORDER — VITAMIN C 500 MG PO TABS
500.0000 mg | ORAL_TABLET | Freq: Every day | ORAL | Status: DC
  Administered 2018-04-07 – 2018-04-09 (×3): 500 mg via ORAL
  Filled 2018-04-07 (×3): qty 1

## 2018-04-07 NOTE — Progress Notes (Signed)
Social Work Patient ID: Maria Avila, female   DOB: [birth year], 67 y.o.   MRN: [id number] Met with pt and husband to discuss team conference and progress toward her goals. Main focus this week has been family training with husband. Husband feels it is going well and he feels prepared to take her home. He is concerned about her sacral wound and the dressing change it will need. Discussed the home health RN will be coming out also but will train husband to do the dressing change. Husband is willing to have pt go home via ambulance due to not able to get in and out of a car. Will also have apply for [facility] for appointments. Have ordered DME and they should be contacting husband to set up delivery. Work toward discharge Friday.

## 2018-04-07 NOTE — Progress Notes (Signed)
Occupational Therapy Session Note  Patient Details  Name: Maria Avila MRN: [id number] Date of Birth: 1951/01/17  Today's Date: 04/07/2018 OT Individual Time: 5631-4970 OT Individual Time Calculation (min): 42 min  and Today's Date: 04/07/2018 OT Missed Time: 18 Minutes Missed Time Reason: Patient fatigue;Pain   Short Term Goals: Week 3:  OT Short Term Goal 1 (Week 3): Pt will don pants with mod assist at bed level 3 out of 4 attempts. OT Short Term Goal 2 (Week 3): Pt will recall sequencing of bed mobility with min cues to decrease burden of care. OT Short Term Goal 3 (Week 3): Pt will complete toilet transfer with max assist   Skilled Therapeutic Interventions/Progress Updates:    Treatment session with focus on hands on family education with pt and pt's husband.  Pt received in TIS w/c reclined back.  Pt requesting to return to bed and not wishing to engage in any therapeutic activity prior to returning to bed.  Therapist encouraged pt's husband to complete entire transfer back to bed with use of hoyer.  Pt's husband required 20 mins to complete transfer from beginning to end, as he was very thorough and checked the sling twice before lifting pt with hoyer lift.  No cues provided from therapist during setup or transfer.  Therapist provided min cues for positioning of pt while engaging in rolling to remove sling that would carry over to bathing and dressing at bed level.  Engaged in discussion about use of hospital bed features to decrease burden of care on caregiver.  Also discussed transfers to drop arm BSC via use of slide board or hoyer depending on pt level of pain.  Pt's husband has not had adequate training with slide board at this time, but has demonstrated competence with use of hoyer lift.  Pt is frequently incontinent, therefore hygiene will most likely be completed at bed level.  Pt declining any further therapeutic intervention at this time due to pain.  Pt's husband with no further  questions at this time.  Therapy Documentation Precautions:  Precautions Precautions: Fall Precaution Comments: apraxic, somewhat impulsive Restrictions Weight Bearing Restrictions: No General: General OT Amount of Missed Time: 18 Minutes Pain: Pain Assessment Pain Scale: 0-10 Pain Score: 8  Pain Type: Acute pain Pain Location: Buttocks Pain Intervention(s): Premedicated and awaiting additional pain meds    Therapy/Group: Individual Therapy  Simonne Come 04/07/2018, 1:56 PM

## 2018-04-07 NOTE — Progress Notes (Signed)
Physical Therapy Hydrotherapy Discharge Note  Patient Details  Name: Maria Avila MRN: [id number] Date of Birth: Aug 08, 1950 Today's Date: 04/07/2018  Pt seen in conjunction with [facility] RN to assess further need for hydrotherapy. RN agreed that wound had progressed to a point where hydrotherapy is no longer needed. Will sign off at this time. If needs change, please reconsult.     Thelma Comp 04/07/2018, 10:56 AM   Rolinda Roan, PT, DPT Acute Rehabilitation Services Pager: [phone] Office: [phone]

## 2018-04-07 NOTE — Progress Notes (Addendum)
Physical Therapy Discharge Summary  Patient Details  Name: Maria Avila MRN: [id number] Date of Birth: 08/07/1950  Today's Date: 04/09/2018    Patient has met 5 of 7 long term goals due to improved activity tolerance, improved balance and improved postural control.  Patient to discharge at a wheelchair level dependent manual hoyer lift transfers.   Patient's husband ([name]) has been present and participated in hands on caregiver training with bed mobility and manual hoyer lift transfers. [name] has been educated on need for ramp installation and DME recommendations as noted below.  Reasons goals not met: Pt did not meet gait goal as she requires lite gait or +2-3 assist to safely attempt gait and focus has shifted to caregiver training for bed mobility and manual hoyer lift transfers to the TIS w/c. Pt did not meet memory goal 2/2 impaired cognition/memory.  Recommendation:  Patient will benefit from ongoing skilled PT services in home health setting to continue to advance safe functional mobility, address ongoing impairments in bed mobility, transfers, sitting and standing balance, NMR, and minimize fall risk.  Equipment: TIS w/c, hospital bed, manual hoyer lift  Reasons for discharge: lack of progress toward goals and discharge from hospital  Patient/family agrees with progress made and goals achieved: Yes  PT Discharge Precautions/Restrictions Precautions Precautions: Fall Precaution Comments: ataxic Restrictions Weight Bearing Restrictions: No   Cognition Overall Cognitive Status: Impaired/Different from baseline Sustained Attention: Impaired Memory: Impaired Awareness: Impaired Awareness Impairment: Intellectual impairment;Emergent impairment;Anticipatory impairment Problem Solving: Impaired Problem Solving Impairment: Functional complex;Functional basic Safety/Judgment: Impaired   Sensation Coordination Gross Motor Movements are Fluid and Coordinated: No Fine Motor  Movements are Fluid and Coordinated: No  Motor  Motor Motor: Motor apraxia;Hemiplegia;Abnormal postural alignment and control, ataxia Motor - Discharge Observations: generalized deconditioning   Mobility Bed mobility (rolling L<>R, supine>sit) with min<>mod assist and hospital bed features. Transfers Transfers: Sit to Stand;Squat Pivot Transfers Sit to Stand: (min assist +2 with stedy lift) Squat Pivot Transfers: Moderate Assistance - Patient 50-74%(pt with instances of B knees extending out from under her)  Locomotion  Pt has attempted gait with lite gait and +2-3 with 3 muskateers but with limited ability to maintain weight bearing through BLE and support self with use of Lite Gait, and requires significant assistance for BLE placement for each step, and blocking at knees to prevent A/P instability.  Pt unable to sufficiently propel w/c independently -- recommending use of TIS w/c.  Trunk/Postural Assessment  Lumbar Assessment Lumbar Assessment: Exceptions to WFL(posterior pelvic tilt) Postural Control Postural Control: Deficits on evaluation Righting Reactions: impaired Protective Responses: impaired   Balance Balance Balance Assessed: Yes Static Sitting Balance Static Sitting - Level of Assistance: 5: Stand by assistance Dynamic Sitting Balance Dynamic Sitting - Level of Assistance: 4: Min assist during slide board transfer  Extremity Assessment  Per OT report:  RUE Assessment RUE Assessment: Exceptions to WFL Active Range of Motion (AROM) Comments: shoulder flexion limited to 60* with use of abduction to achieve even 60*, elbow and wrist WFL General Strength Comments: bicep/tricep 4/5, loose gross grasp LUE Assessment LUE Assessment: Within Functional Limits Active Range of Motion (AROM) Comments: utilizes abduction to achieve shoulder flexion, elbow WFL General Strength Comments: grossly 4/5, Lt grasp > Rt  BLE ROM WFL but difficult to assist strength 2/2 impaired  cognition & motor apraxia. Pt able to weight bear in standing without BLE buckling for brief periods of time.     M  04/09/2018, 7:43 AM   

## 2018-04-07 NOTE — Patient Care Conference (Signed)
Inpatient RehabilitationTeam Conference and Plan of Care Update Date: 04/07/2018   Time: 10:40 AM    Patient Name: Maria Avila      Medical Record Number: [id number]  Date of Birth: 06-26-1950 Sex: Female         Room/Bed: 4W14C/4W14C-01 Payor Info: Payor: [employer] MEDICARE / Plan: [facility] MEDICARE / Product Type: *No Product type* /    Admitting Diagnosis: L CVA  Admit Date/Time:  03/17/2018  2:25 PM Admission Comments: No comment available   Primary Diagnosis:  <principal problem not specified> Principal Problem: <principal problem not specified>  Patient Active Problem List   Diagnosis Date Noted  . Acute blood loss anemia   . Impulsive   . Stroke due to embolism ([place]) 03/17/2018  . Hypokalemia   . ETOH abuse   . Dysphagia, post-stroke   . Benign essential HTN   . Acute deep vein thrombosis (DVT) of distal vein of both lower extremities (HCC)   . Sinus tachycardia   . Chest tube in place   . Esophageal perforation   . Perforation esophagus   . Sepsis with acute renal failure ([place])   . Acute deep vein thrombosis (DVT) of both peroneal veins   . Leukocytosis   . Sepsis due to Pseudomonas species ([place])   . Cerebral embolism with cerebral infarction 03/09/2018  . Malnutrition of moderate degree 03/08/2018  . Empyema ([place]) 03/04/2018  . Pressure injury of skin 03/04/2018  . Acute respiratory failure with hypercapnia ([place]) 03/04/2018  . Diarrhea 02/26/2018  . Pancytopenia ([place]) 02/19/2018  . Hypotension 02/19/2018  . Severe protein-calorie malnutrition ([place]) 02/17/2018  . Colitis 02/16/2018  . Weakness 02/16/2018  . Nausea vomiting and diarrhea 02/16/2018  . Diabetes ([place]) 02/16/2018  . Essential hypertension 02/16/2018  . Anemia 02/16/2018    Expected Discharge Date: Expected Discharge Date: 04/09/18  Team Members Present: Physician leading conference: Dr. Alysia Penna Social Worker Present: Ovidio Kin, LCSW Nurse Present: Leonette Nutting, RN PT  Present: Lavone Nian, PT OT Present: Simonne Come, OT SLP Present: Charolett Bumpers, SLP PPS Coordinator present : Gunnar Fusi;Daiva Nakayama, RN, CRRN     Current Status/Progress Goal Weekly Team Focus  Medical   Finished hydrotherapy, santyl collagenas     caregiver training   Bowel/Bladder   Incontinent of Bowel and Bladder; LBM12/07/2017  Continent of Bowel and Bladder  Toilet prn and bladder scan and I/O q 4-6 hrs if > 361m   Swallow/Nutrition/ Hydration   dys 3 and thin, Mod I - set up intermittent supervision  Mod I - goal met      ADL's   mod assist bed mobility, mod-max assist bathing and dressing at bed level, max to +2 squat pivot transfers.  Have begun hands on training with husband with bed mobility and use of hoyer lift for transfers  mod assist   ADL retraining, transfers, pt/family education   Mobility   focusing on caregiver training and manual hoyer lift transfers bed<>TIS, appointment for specialty w/c on Thursday, min assist rolling in bed  min assist sitting balance & bed mobility, max assist bed<>w/c, max assist gait with PT only  caregiver training & d/c planning, bed mobility, manual hoyer lift transfers, specialty w/c consult   Communication             Safety/Cognition/ Behavioral Observations  Mod A, Mod-Min A orientation  Mod A   orientation, sustained, basic problem solving, intellectual awareness    Pain   Pain relieved with tylenol  Pain <  or = 2  Assess pain and provide prn medicate as needed   Skin   MASD to groin and peri area; unstagable wound to buttock tx with hydrotherapy MWF and santyl and saline moist gauze T, TH, Sat, Sun   To remain free of further breakdown and promote healthy tissue healing of present wounds  assess skin q shift and prn change dressings prn educate pt and family to treatment      *See Care Plan and progress notes for long and short-term goals.     Barriers to Discharge  Current Status/Progress Possible Resolutions Date  Resolved   Physician    Decreased caregiver support;Inaccessible home environment;Medical stability     slow progress cognitively  Cont rehab d/c training      Nursing                  PT  Inaccessible home environment;Decreased caregiver support;Home environment access/layout;Lack of/limited family support;Incontinence;Other (comments)  steps to enter home and unsure if ramp will be installed prior to d/c, pt with sacral wound, family will have to provide extensive 24 hr assist              OT                  SLP                SW                Discharge Planning/Teaching Needs:  Husband learning pt's care. Question if he can do the dressing on her sacrum. Goals have been downgraded and husband still wants to take pt home and not pursue NH      Team Discussion:  Focus this week is on family education with husband. Learning hoyer lift and transfers. Nursing teaching dressing change for sacrum-husband is not sure he can do this. Husband set on taking her home and aware of her care needs. Hydrotherapy to DC today due to healing of sacral wound.  Revisions to Treatment Plan:  DC 12/6    Continued Need for Acute Rehabilitation Level of Care: The patient requires daily medical management by a physician with specialized training in physical medicine and rehabilitation for the following conditions: Daily direction of a multidisciplinary physical rehabilitation program to ensure safe treatment while eliciting the highest outcome that is of practical value to the patient.: Yes Daily medical management of patient stability for increased activity during participation in an intensive rehabilitation regime.: Yes Daily analysis of laboratory values and/or radiology reports with any subsequent need for medication adjustment of medical intervention for : Neurological problems;Wound care problems   I attest that I was present, lead the team conference, and concur with the assessment and plan of the  team.   Elease Hashimoto 04/07/2018, 12:58 PM

## 2018-04-07 NOTE — Progress Notes (Addendum)
Physical Therapy Session Note  Patient Details  Name: Maria Avila MRN: [id number] Date of Birth: [birth year]  Today's Date: 04/07/2018 PT Individual Time: 1112-1205 PT Individual Time Calculation (min): 53 min   Short Term Goals: Week 3:  PT Short Term Goal 1 (Week 3): STG = LTG due to anticipated d/c date.  Skilled Therapeutic Interventions/Progress Updates:  Pt received in bed with husband [name] present for caregiver training. Educated [name] on recommendation of non emergent EMS transport home and use of SCAT to attend appointments, and discussed need to transport TIS w/c with [name] agreeable to all education & transport home. Also educated him on DME recommendations of hospital bed, TIS w/c & appointment tomorrow (requested he attend), and manual hoyer lift. [name] donned pt's brief, pants, non-skid socks, and shirt with total assist for all except max assist for shirt. Therapist assisted with donning LLE ted hose while [name] completed R with extra time. Therapist educated [name] on how to place hoyer sling with pt rolling L<>R in bed and [name] transferred pt bed>TIS w/c with min cuing for managing lift legs and brakes. Pt with behaviors & c/o significant pain in buttocks and meds requested. [name] would benefit from continued practice with hoyer lift transfers to progress to doing them without any cuing from therapist but as of right now he does a sufficient job with task. At end of session pt left sitting in TIS w/c with chair alarm donned & set up with meal tray. Therapist set timer and educated [name] on need to reposition w/c in 30 minutes.   Addendum: Educated [name] on need for ramp installation with him reporting one is not installed yet but he can use his lawnmower ramps to get pt in/out of house in w/c - therapist educated him on need for non emergent EMS transport home.   Therapy Documentation Precautions:  Precautions Precautions: Fall Precaution Comments: apraxic, somewhat  impulsive Restrictions Weight Bearing Restrictions: No    Therapy/Group: Individual Therapy  [name] 04/07/2018, 12:48 PM

## 2018-04-07 NOTE — Consult Note (Signed)
[facility] Nurse wound follow up Wound type:unstageable pressure ulcer Measurement: 4cm x 7cm x 2cm deep with 2cm undermining from 9-2 o'clock Wound bed: 100% white thin layer of slough  Drainage (amount, consistency, odor) scant tan exudate Periwound:MASD Dressing procedure/placement/frequency: I met with PT as they have reported that they believe Hydrotherapy has reached a point where there is no more benefit to it. I will order Santyl daily to continue without hydrotherapy. PA would like for [facility] team to assess Friday to make sure wound is not digressing as pt will be discharged prior to our weekly visit next week. Nutritional level is poor, recommend supplements or nutritional consult for optimum wound healing, please order if you agree. [place] team will see again Friday to assess progress per Algis Liming, PA.  Fara Olden, RN-C, WTA-C, [place] Wound Treatment Associate Ostomy Care Associate

## 2018-04-07 NOTE — Progress Notes (Signed)
Wound examined with WOC.  She has had issues with poor po intake X months and intakes limited to 50%.  She is on prostat twice a day--had diarrhea with Ensure. Will add vitamin C and Zinc to help promote healing.

## 2018-04-07 NOTE — Progress Notes (Signed)
[name] PHYSICAL MEDICINE & REHABILITATION PROGRESS NOTE   Subjective/Complaints:  Oriented to person and place and situation, discussed with SLP  ROSNo CP, SOB, N/V/D, eating breakfast  Objective:   No results found. Recent Labs    04/05/18 0429  WBC 10.7*  HGB 7.7*  HCT 24.8*  PLT 301   Recent Labs    04/05/18 0429  NA 143  K 3.5  CL 111  CO2 26  GLUCOSE 92  BUN 19  CREATININE 0.60  CALCIUM 8.8*    Intake/Output Summary (Last 24 hours) at 04/07/2018 0807 Last data filed at 04/07/2018 0518 Gross per 24 hour  Intake 360 ml  Output 500 ml  Net -140 ml     Physical Exam: Vital Signs Blood pressure 101/79, pulse (!) 108, temperature 98.4 F (36.9 C), temperature source Oral, resp. rate 15, weight 52 kg, SpO2 100 %. Constitutional: No distress . Vital signs reviewed. HENT: Normocephalic.  Atraumatic. Eyes: EOMI. No discharge. Cardiovascular: RRR.  No JVD. Respiratory: crackles at Left base no resp distress, no wheezes GI: BS +. Non-distended. Musculoskeletal: She exhibits bilateral lower extremity edema Neurological: She is alert and oriented x1.  Delayed processing.  Motor: 4/5 bue and ble  Skin: Sacral wound dressed.  Psychiatric: Restless.  Confused.  Assessment/Plan: 1. 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  Physiatrist is providing close team supervision and 24 hour management of active medical problems listed below.  Physiatrist and rehab team continue to assess barriers to discharge/monitor patient progress toward functional and medical goals  Care Tool:  Bathing              Bathing assist Assist Level: 2 Helpers(per reports)     Upper Body Dressing/Undressing Upper body dressing   What is the patient wearing?: Pull over shirt    Upper body assist Assist Level: Moderate Assistance - Patient 50 - 74%     Lower Body Dressing/Undressing Lower body dressing      What is the patient wearing?: Pants     Lower body assist Assist for lower body dressing: Maximal Assistance - Patient 25 - 49%     Toileting Toileting    Toileting assist Assist for toileting: 2 Helpers     Transfers Chair/bed transfer  Transfers assist     Chair/bed transfer assist level: 2 Helpers     Locomotion Ambulation   Ambulation assist   Ambulation activity did not occur: Safety/medical concerns(apraxia)  Assist level: 2 helpers Assistive device: Other (comment)(3 muskateers ) Max distance: 19f   Walk 10 feet activity   Assist     Assist level: 2 helpers Assistive device: Lite Gait   Walk 50 feet activity   Assist           Walk 150 feet activity   Assist           Walk 10 feet on uneven surface  activity   Assist           Wheelchair     Assist Will patient use wheelchair at discharge?: Yes Type of Wheelchair: Manual    Wheelchair assist level: Maximal Assistance - Patient 25 - 49% Max wheelchair distance: 15    Wheelchair 50 feet with 2 turns activity    Assist    Wheelchair 50 feet with 2 turns activity did not occur: Safety/medical concerns(fatigue)   Assist Level: Minimal Assistance - Patient > 75%  Wheelchair 150 feet activity     Assist Wheelchair 150 feet activity did not occur: Safety/medical concerns(fatigue)         Medical Problem List and Plan: 1.  Limitations in mobility, self-care, endurance secondary to L>R infarcts and multiple medical issues.  -Continue CIR PT, OT, SLP-Team conference today please see physician documentation under team conference tab, met with team face-to-face to discuss problems,progress, and goals. Formulized individual treatment plan based on medical history, underlying problem and comorbidities.  2.  BLE DVTs/Anticoagulation: Pharmaceutical: Other (comment)-on Eliquis 3. Pain Management: tylenol  prn.  4. Mood: LCSW to follow for evaluation and support.  5. Neuropsych: This patient is not capable of making decisions on her own behalf. 6. Skin/Wound Care: Air mattress due to sacral breakdown.   -hydrotherapy per PT- resume today , will not have this at home will cont sanyl collagenase vs W to D sterile dressing at home 7. Fluids/Electrolytes/Nutrition:  Continue dysphagia 3 thin diet. - may d/c CBG Off TNA. I~682m po  BMP within acceptable range on 11/16-  8. Empyema:off abx afebrile, no SOB 9. Malnutrition/Heavy alcohol use: Poor intake with 40 lbs weight loss.   -B12 and folate levels normal 10. Diarrhea: Added probiotic and fiber. Discontinued Ensure and added prostat.  Stools still mushy with incont  Overall improved, ? GI blood loss, one stool + and one stool neg, Hgb stable  -added imodium Reviewed chart has [facility]d [facility] [place] abd showed non specific enteral wall thickening 11. Hypokalemia: Recheck on 11/22, normal  13.  Acute blood loss anemia  Hb 9.0 on 11/16 , 7.9  11/22- 8.3 11/25, no need for transfusion Recheck 12/2 stable   14.  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  Urinary retention,still requiring  ICP, on  Flomax, increase  Urecholine, 1 high vol cath, increase  urecholine and monitor for retention         16.  Agitation improved will reduce seroquelto 12.537mLOS: 21 days A FACE TO FACE EVALUATION WAS PERFORMED  AnCharlett Blake2/08/2017, 8:07 AM

## 2018-04-07 NOTE — Progress Notes (Signed)
Speech Language Pathology Daily Session Note  Patient Details  Name: Maria Avila MRN: [id number] Date of Birth: [birth year]  Today's Date: 04/07/2018 SLP Individual Time: 0802-0900 SLP Individual Time Calculation (min): 58 min  Short Term Goals: Week 3: SLP Short Term Goal 1 (Week 3): Pt will complete basic problem solving tasks with Mod A cues.  SLP Short Term Goal 2 (Week 3): Pt will sustain attention to basic task for ~ 15 minutes with Min A cues.  SLP Short Term Goal 3 (Week 3): Pt will locate month and day of week on calendar with Mod A cues in 7 out of 10 opportunties.  SLP Short Term Goal 4 (Week 3): Pt will follow 1 step directions related to ADLs/transfers with Mod A cues in 5 out of 10 opportunities.   Skilled Therapeutic Interventions:Skilled ST services focused on cognitive skills. SLP facilitated orientation of situation, place and time, pt required min A verbal cues for use of external aids. SLP facilitated basic problem solving skills utilizing simple PEG design pt required mod-min A verbal cues with initial assistance of planning, pt required min A verbal cues for sustained attention to task in 15 minute intervals. Pt was left in room with call bell within reach and bed alarm set. SLP reccomends to continue skilled services.     Pain Pain Assessment Pain Scale: 0-10 Pain Score: 0-No pain Pain Type: Acute pain Pain Location: Abdomen Pain Orientation: Right;Left;Mid Pain Descriptors / Indicators: Aching Pain Frequency: Constant Pain Onset: Progressive Patients Stated Pain Goal: 0 Pain Intervention(s): Medication (See eMAR)  Therapy/Group: Individual Therapy  Vernel Donlan  [facility] 04/07/2018, 3:52 PM

## 2018-04-08 ENCOUNTER — Inpatient Hospital Stay (HOSPITAL_COMMUNITY): Payer: Self-pay

## 2018-04-08 ENCOUNTER — Inpatient Hospital Stay (HOSPITAL_COMMUNITY): Payer: Self-pay | Admitting: Physical Therapy

## 2018-04-08 ENCOUNTER — Inpatient Hospital Stay (HOSPITAL_COMMUNITY): Payer: Self-pay | Admitting: Occupational Therapy

## 2018-04-08 ENCOUNTER — Inpatient Hospital Stay (HOSPITAL_COMMUNITY): Payer: Self-pay | Admitting: Speech Pathology

## 2018-04-08 NOTE — Progress Notes (Signed)
Speech Language Pathology Discharge Summary  Patient Details  Name: Maria Avila MRN: [id number] Date of Birth: 12-25-1950  Today's Date: 04/08/2018 SLP Individual Time: 1100-1200 SLP Individual Time Calculation (min): 60 min   Skilled Therapeutic Interventions:  Skilled treatment session focused on completing education with pt and her husband for safe discharge plan. SLP reviewed pt's cognitive abilities and areas of deficits as well as ways to facilitate activities at home safely. Pt's husband voiced understanding. SLP also reviewed current diet, rationale for diet recommendations as well as items that would be safe to consume at home. All questions answered to pt and husband's satisfaction.      Patient has met 7 of 7 long term goals.  Patient to discharge at overall Mod level.    Clinical Impression/Discharge Summary:   Pt has made slower than expected progress in skilled ST and as a result she is discharging at Mod A cognitive level. Pt continues to experience overall confusion and difficulty understanding tasks within her day. Encouraged husband and pt to create a routine at home and to perform tasks within ADLs the same way each time. Pt would benefit from [place] to target memory strategies within home, increased problem solving within ADLs at home to increase pt's functional independence.   Care Partner:  Caregiver Able to Provide Assistance: Yes  Type of Caregiver Assistance: Physical;Cognitive  Recommendation:  Home Health SLP;24 hour supervision/assistance  Rationale for SLP Follow Up: Maximize functional communication;Maximize cognitive function and independence;Maximize swallowing safety;Reduce caregiver burden   Equipment:     Reasons for discharge: Treatment goals met;Discharged from hospital   Patient/Family Agrees with Progress Made and Goals Achieved: Yes    [name] 04/08/2018, 12:02 PM

## 2018-04-08 NOTE — Progress Notes (Signed)
[facility] PHYSICAL MEDICINE & REHABILITATION PROGRESS NOTE   Subjective/Complaints:  Denies buttocks pain  ROSNo CP, SOB, N/V/D, eating breakfast  Objective:   No results found. No results for input(s): WBC, HGB, HCT, PLT in the last 72 hours. No results for input(s): NA, K, CL, CO2, GLUCOSE, BUN, CREATININE, CALCIUM in the last 72 hours.  Intake/Output Summary (Last 24 hours) at 04/08/2018 0737 Last data filed at 04/08/2018 3532 Gross per 24 hour  Intake 700 ml  Output 900 ml  Net -200 ml     Physical Exam: Vital Signs Blood pressure 106/70, pulse 96, temperature 98 F (36.7 C), temperature source Oral, resp. rate 19, weight 52 kg, SpO2 98 %. Constitutional: No distress . Vital signs reviewed. HENT: Normocephalic.  Atraumatic. Eyes: EOMI. No discharge. Cardiovascular: RRR.  No JVD. Respiratory: crackles at Left base no resp distress, no wheezes GI: BS +. Non-distended. Musculoskeletal: She exhibits bilateral lower extremity edema Neurological: She is alert and oriented x1.  Delayed processing.  Motor: 4/5 bue and ble  Skin: Sacral wound dressed.  Psychiatric: Restless.  Confused.  Assessment/Plan: 1. 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  Physiatrist is providing close team supervision and 24 hour management of active medical problems listed below.  Physiatrist and rehab team continue to assess barriers to discharge/monitor patient progress toward functional and medical goals  Care Tool:  Bathing              Bathing assist Assist Level: 2 Helpers(per reports)     Upper Body Dressing/Undressing Upper body dressing   What is the patient wearing?: Pull over shirt    Upper body assist Assist Level: Moderate Assistance - Patient 50 - 74%    Lower Body Dressing/Undressing Lower body dressing      What is the patient wearing?:  Pants     Lower body assist Assist for lower body dressing: Maximal Assistance - Patient 25 - 49%     Toileting Toileting    Toileting assist Assist for toileting: 2 Helpers     Transfers Chair/bed transfer  Transfers assist     Chair/bed transfer assist level: Dependent - mechanical lift     Locomotion Ambulation   Ambulation assist   Ambulation activity did not occur: Safety/medical concerns(apraxia)  Assist level: 2 helpers Assistive device: Other (comment)(3 muskateers ) Max distance: 51ft   Walk 10 feet activity   Assist     Assist level: 2 helpers Assistive device: Lite Gait   Walk 50 feet activity   Assist           Walk 150 feet activity   Assist           Walk 10 feet on uneven surface  activity   Assist           Wheelchair     Assist Will patient use wheelchair at discharge?: Yes Type of Wheelchair: Manual    Wheelchair assist level: Maximal Assistance - Patient 25 - 49% Max wheelchair distance: 15    Wheelchair 50 feet with 2 turns activity    Assist    Wheelchair 50 feet with 2 turns activity did not occur: Safety/medical concerns(fatigue)   Assist Level: Minimal Assistance - Patient > 75%   Wheelchair 150 feet activity     Assist Wheelchair 150 feet activity did not occur: Safety/medical concerns(fatigue)         Medical Problem List  and Plan: 1.  Limitations in mobility, self-care, endurance secondary to L>R infarcts and multiple medical issues.  -Continue CIR PT, OT, SLP-Plan D/C on Friday 2.  BLE DVTs/Anticoagulation: Pharmaceutical: Other (comment)-on Eliquis 3. Pain Management: tylenol prn.  4. Mood: LCSW to follow for evaluation and support.  5. Neuropsych: This patient is not capable of making decisions on her own behalf. 6. Skin/Wound Care: Air mattress due to sacral breakdown.   -hydrotherapy per PT- resume today , will not have this at home will cont sanyl collagenase vs W to D  sterile dressing at home 7. Fluids/Electrolytes/Nutrition:  Continue dysphagia 3 thin diet. - may d/c CBG Off TNA. I~659ml po  BMP within acceptable range on 11/16-  8. Empyema:off abx afebrile, no SOB 9. Malnutrition/Heavy alcohol use: Poor intake with 40 lbs weight loss.   -B12 and folate levels normal 10. Diarrhea: Added probiotic and fiber. Discontinued Ensure and added prostat.  Stools still mushy with incont  Overall improved, ? GI blood loss, one stool + and one stool neg, Hgb stable  -added imodium Reviewed chart has had workup with GI for abd pain, see MD at [facility] , CT abd showed non specific enteral wall thickening 11. Hypokalemia: Recheck on 11/22, normal  13.  Acute blood loss anemia  Hb 9.0 on 11/16 , 7.9  11/22- 8.3 11/25, no need for transfusion Recheck 12/2 stable   14.  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  Urinary retention,still requiring  ICP, on  Flomax, increase  Urecholine, 1 high vol cath, increase  urecholine and monitor for retention         16.  Agitation improved will D/C seroquel  LOS: 22 days A FACE TO FACE EVALUATION WAS PERFORMED  Charlett Blake 04/08/2018, 7:37 AM

## 2018-04-08 NOTE — Progress Notes (Signed)
Physical Therapy Session Note  Patient Details  Name: Maria Avila MRN: [id number] Date of Birth: 1951-04-07  Today's Date: 04/08/2018 PT Individual Time: 1300-1415 PT Individual Time Calculation (min): 75 min   Short Term Goals: Week 3:  PT Short Term Goal 1 (Week 3): STG = LTG due to anticipated d/c date.  Skilled Therapeutic Interventions/Progress Updates: Pt received seated in bed, denies pain and agreeable to treatment. Reports urgency to void. Supine>sit minA with HOB elevated and bedrails. Sit >stand in stedy with minA +2 of therapist and husband. Transferred on/off toilet dependently in stedy; standbyA standing balance while pt's husband managed clothing and performed hygiene. Discussed home toileting program; reports plan to use hoyer to transfer to/from [facility], and will need to learn I&O catheter as pt not voiding urine independently. Transported to gym [place]. Pt and husband participated in w/c evaluation with Felton Clinton from [place] care. Performed lateral scoot transfer with transfer board to mat with minA towards R side. Sit <>supine on mat minA. Returned to w/c min/modA squat pivot to L. Remained semireclined in w/c at end of session, chair lap alarm intact, telesitter intact and all needs in reach.      Therapy Documentation Precautions:  Precautions Precautions: Fall Precaution Comments: ataxic Restrictions Weight Bearing Restrictions: No   Therapy/Group: Individual Therapy  Corliss Skains 04/08/2018, 3:55 PM

## 2018-04-08 NOTE — Progress Notes (Signed)
Occupational Therapy Discharge Summary  Patient Details  Name: Maria Avila MRN: [id number] Date of Birth: 02/25/1951  Patient has met 7 of 10 long term goals due to improved activity tolerance, improved balance, ability to compensate for deficits and improved awareness.  Patient to discharge at overall Mod Assist level.  Patient's care partner is independent to provide the necessary physical and cognitive assistance at discharge.  Extensive education completed with pt and pt's husband regarding self-care tasks to be completed at bed level and use of hoyer lift for transfers.  Pt's husband has demonstrated ability to provide the necessary assistance and complete transfers safely with use of hoyer lift.  Reasons goals not met: Pt requires use of hoyer lift for toilet transfers and is currently incontinent, therefore pt's husband is completing hygiene at bed level.    Recommendation:  Patient will benefit from ongoing skilled OT services in home health setting to continue to advance functional skills in the area of BADL and Reduce care partner burden.  Equipment: wide drop arm BSC, hospital bed, hoyer lift  Reasons for discharge: treatment goals met and discharge from hospital  Patient/family agrees with progress made and goals achieved: Yes  OT Discharge Precautions/Restrictions  Precautions Precautions: Fall Precaution Comments: ataxic Pain Pain Assessment Pain Scale: 0-10 Pain Score: 0-No pain Pain Type: Acute pain Pain Location: Abdomen Pain Orientation: Mid;Left;Right Pain Radiating Towards: (Buttocks) Pain Descriptors / Indicators: Aching Pain Frequency: Constant Pain Onset: On-going Patients Stated Pain Goal: 0 Pain Intervention(s): Medication (See eMAR) ADL ADL Eating: Set up Where Assessed-Eating: Wheelchair Grooming: Minimal assistance Where Assessed-Grooming: Bed level Upper Body Bathing: Setup Where Assessed-Upper Body Bathing: Bed level Lower Body Bathing:  Moderate assistance Where Assessed-Lower Body Bathing: Bed level Upper Body Dressing: Minimal assistance Where Assessed-Upper Body Dressing: Bed level Lower Body Dressing: Moderate assistance Where Assessed-Lower Body Dressing: Bed level Toileting: Dependent Where Assessed-Toileting: Bed level Toilet Transfer: Dependent Toilet Transfer Method: Charlaine Dalton or [employer]) Science writer: (Adult nurse) Vision Baseline Vision/History: Wears glasses Wears Glasses: At all times Patient Visual Report: No change from baseline Vision Assessment?: Vision impaired- to be further tested in functional context Cognition Overall Cognitive Status: Impaired/Different from baseline Arousal/Alertness: Awake/alert Orientation Level: Oriented to person;Oriented to place;Disoriented to time;Disoriented to situation Attention: Selective Sustained Attention: Appears intact Selective Attention: Impaired Selective Attention Impairment: Verbal basic;Functional basic Memory: Impaired Memory Impairment: Storage deficit;Retrieval deficit;Decreased recall of new information;Decreased short term memory Decreased Short Term Memory: Verbal basic;Functional basic Awareness: Impaired Awareness Impairment: Emergent impairment;Anticipatory impairment Problem Solving: Impaired Problem Solving Impairment: Verbal basic;Functional complex Executive Function: (all impaired d/t lower level deficits) Behaviors: Impulsive;Perseveration Safety/Judgment: Impaired Sensation Sensation Light Touch: Appears Intact Proprioception: Appears Intact Coordination Gross Motor Movements are Fluid and Coordinated: No Fine Motor Movements are Fluid and Coordinated: No Extremity/Trunk Assessment RUE Assessment RUE Assessment: Exceptions to [facility] Active Range of Motion (AROM) Comments: shoulder flexion limited to 60* with use of abduction to achieve even 60*, elbow and wrist [facility] General Strength Comments: bicep/tricep 4/5, loose  gross grasp LUE Assessment LUE Assessment: Within Functional Limits Active Range of Motion (AROM) Comments: utilizes abduction to achieve shoulder flexion, elbow [facility] General Strength Comments: grossly 4/5, Lt grasp > Rt   Tiajuana Leppanen 04/08/2018, 12:29 PM

## 2018-04-08 NOTE — Progress Notes (Signed)
Occupational Therapy Session Note  Patient Details  Name: Maria Avila MRN: [id number] Date of Birth: 02/13/1951  Today's Date: 04/08/2018 OT Individual Time: 1000-1100 OT Individual Time Calculation (min): 60 min    Short Term Goals: Week 3:  OT Short Term Goal 1 (Week 3): Pt will don pants with mod assist at bed level 3 out of 4 attempts. OT Short Term Goal 2 (Week 3): Pt will recall sequencing of bed mobility with min cues to decrease burden of care. OT Short Term Goal 3 (Week 3): Pt will complete toilet transfer with max assist   Skilled Therapeutic Interventions/Progress Updates:    Completed ADL retraining and family education with focus on bathing and dressing at bed level.  Therapist reiterated importance of pt actively participating in self-care tasks as she has demonstrated ability to thread BLE pant legs in figure 4 position in bed.  Pt's husband completed perineal hygiene at bed level and provided pt with mod-max cues for sequencing of rolling during LB bathing and dressing.  Pt able to thread BLE pant legs with assist to obtain and maintain figure 4 position.  Pt able to don socks in same position.  Pt's husband set up sling and hoyer lift and transferred pt to TIS w/c with only min cues to cross leg straps when hooking to hoyer lift.  Pt left in TIS to complete grooming tasks with assistance from husband.  Pt's husband is demonstrating good sequencing and safety awareness during mobility, especially with use of hoyer lift.    Therapy Documentation Precautions:  Precautions Precautions: Fall Precaution Comments: ataxic Restrictions Weight Bearing Restrictions: No Pain: Pain Assessment Pain Scale: 0-10 Pain Score: 0-No pain Pain Type: Acute pain Pain Location: Abdomen Pain Orientation: Mid;Left;Right Pain Radiating Towards: (Buttocks) Pain Descriptors / Indicators: Aching Pain Frequency: Constant Pain Onset: On-going Patients Stated Pain Goal: 0 Pain  Intervention(s): Medication (See eMAR) ADL: ADL Eating: Set up Where Assessed-Eating: Wheelchair Grooming: Minimal assistance Where Assessed-Grooming: Bed level Upper Body Bathing: Setup Where Assessed-Upper Body Bathing: Bed level Lower Body Bathing: Moderate assistance Where Assessed-Lower Body Bathing: Bed level Upper Body Dressing: Minimal assistance Where Assessed-Upper Body Dressing: Bed level Lower Body Dressing: Moderate assistance Where Assessed-Lower Body Dressing: Bed level    Therapy/Group: Individual Therapy  Simonne Come 04/08/2018, 12:18 PM

## 2018-04-09 MED ORDER — POTASSIUM CHLORIDE CRYS ER 20 MEQ PO TBCR: 20.0000 meq | EXTENDED_RELEASE_TABLET | Freq: Two times a day (BID) | ORAL | 0 refills | Status: DC

## 2018-04-09 MED ORDER — ZINC SULFATE 220 (50 ZN) MG PO CAPS: 220.0000 mg | ORAL_CAPSULE | Freq: Every day | ORAL | 0 refills | Status: DC

## 2018-04-09 MED ORDER — PRO-STAT SUGAR FREE PO LIQD: 30.0000 mL | Freq: Two times a day (BID) | ORAL | 0 refills | Status: DC

## 2018-04-09 MED ORDER — THIAMINE HCL 100 MG PO TABS: 100.0000 mg | ORAL_TABLET | Freq: Every day | ORAL | 0 refills | Status: DC

## 2018-04-09 MED ORDER — ACETAMINOPHEN 325 MG PO TABS: 325.0000 mg | ORAL_TABLET | ORAL | Status: DC | PRN

## 2018-04-09 MED ORDER — BETHANECHOL CHLORIDE 10 MG PO TABS: 10.0000 mg | ORAL_TABLET | Freq: Three times a day (TID) | ORAL | 0 refills | Status: DC

## 2018-04-09 MED ORDER — APIXABAN 5 MG PO TABS: 5.0000 mg | ORAL_TABLET | Freq: Two times a day (BID) | ORAL | 0 refills | Status: DC

## 2018-04-09 MED ORDER — ASCORBIC ACID 500 MG PO TABS: 500.0000 mg | ORAL_TABLET | Freq: Two times a day (BID) | ORAL | 0 refills | Status: DC

## 2018-04-09 MED ORDER — FAMOTIDINE 10 MG PO TABS: 10.0000 mg | ORAL_TABLET | Freq: Two times a day (BID) | ORAL | 0 refills | Status: DC

## 2018-04-09 MED ORDER — COLLAGENASE 250 UNIT/GM EX OINT: TOPICAL_OINTMENT | Freq: Every day | CUTANEOUS | 4 refills | Status: DC

## 2018-04-09 MED ORDER — CALCIUM POLYCARBOPHIL 625 MG PO TABS: 1250.0000 mg | ORAL_TABLET | Freq: Two times a day (BID) | ORAL | 0 refills | Status: DC

## 2018-04-09 MED ORDER — LEVETIRACETAM 500 MG PO TABS: 500.0000 mg | ORAL_TABLET | Freq: Two times a day (BID) | ORAL | 0 refills | Status: DC

## 2018-04-09 MED ORDER — LACTINEX PO CHEW: 1.0000 | CHEWABLE_TABLET | Freq: Three times a day (TID) | ORAL | 1 refills | Status: DC

## 2018-04-09 MED ORDER — TAMSULOSIN HCL 0.4 MG PO CAPS: 0.4000 mg | ORAL_CAPSULE | Freq: Every day | ORAL | 0 refills | Status: DC

## 2018-04-09 MED ORDER — GERHARDT'S BUTT CREAM: 1.0000 "application " | TOPICAL_CREAM | Freq: Every day | CUTANEOUS | 0 refills | Status: DC

## 2018-04-09 MED ORDER — LOPERAMIDE HCL 2 MG PO CAPS: 4.0000 mg | ORAL_CAPSULE | Freq: Two times a day (BID) | ORAL | 0 refills | Status: DC

## 2018-04-09 NOTE — Consult Note (Addendum)
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 Appearance and measurements are unchanged since previous progress note. Mod amt tan drainage, no odor. Requested to assess once more prior to discharge. Pt should continue use of Santyl daily to provide enzymatic debridement of nonviable tissue. Recommend home health for continued dressing change assistance and pt could benefit from follow-up at the outpatient wound care center after discharge; please order if desired. Please re-consult if further assistance is needed.  Thank-you,  Julien Girt MSN, [place], [place], [place], [place]

## 2018-04-09 NOTE — Progress Notes (Signed)
Social Work Patient ID: Maria Avila, female   DOB: Jan 26, 1951, 67 y.o.   MRN: [id number] Husband awaiting equipment delivery at home at 10:00. Then will come in and go over discharge instructions with PA. Pt made aware and will await husband's arrival.

## 2018-04-09 NOTE — Progress Notes (Signed)
Social Work  Discharge Note  The overall goal for the admission was met for:   Discharge location: Yes-HOME WITH HUSBAND WHO CAN PROVIDE 24 HR PHYSICAL CARE  Length of Stay: Yes-23 DAYS  Discharge activity level: Yes-MOD LEVEL OF ASSIST  Home/community participation: Yes  Services provided included: MD, RD, PT, OT, SLP, RN, CM, Pharmacy, Neuropsych and SW  Financial Services: Private Insurance: [facility]  Follow-up services arranged: Home Health: [facility]-PT,OT,SP,RN,AIDE, DME: [place] and Patient/Family request agency HH: HAD BEFORE, DME: P[facility]  Comments (or additional information):HUSBAND HAS BEEN IN [place], HE FEELS COMFORTABLE WITH HER PHYSICAL CARE. HE DOE SHAVE CONCERNS REGARDING HER DAILY DRESSING CHANGE ON HER BOTTOM, HE NEEDS MORE EDUCATION VIA HH-RN AND TO DO HANDS ON. COMPLETED [facility] APPLICATION FOR PT AND HAVE FAXED IN WILL CONTACT HUSBAND TO SET UP INTERVIEW  Patient/Family verbalized understanding of follow-up arrangements: Yes  Individual responsible for coordination of the follow-up plan: [name]-HUSBAND  Confirmed correct DME delivered: Elease Hashimoto 04/09/2018    Elease Hashimoto

## 2018-04-09 NOTE — Progress Notes (Signed)
[place] PHYSICAL MEDICINE & REHABILITATION PROGRESS NOTE   Subjective/Complaints:  No issues overnite, remains confused , discussed low vol caths with RN, poor po intake  ROSNo CP, SOB, N/V/D, eating breakfast  Objective:   No results found. No results for input(s): WBC, HGB, HCT, PLT in the last 72 hours. No results for input(s): NA, K, CL, CO2, GLUCOSE, BUN, CREATININE, CALCIUM in the last 72 hours.  Intake/Output Summary (Last 24 hours) at 04/09/2018 0734 Last data filed at 04/09/2018 0512 Gross per 24 hour  Intake 480 ml  Output 438 ml  Net 42 ml     Physical Exam: Vital Signs Blood pressure 129/87, pulse 100, temperature 98 F (36.7 C), temperature source Oral, resp. rate 18, weight 52 kg, SpO2 100 %. Constitutional: No distress . Vital signs reviewed. HENT: Normocephalic.  Atraumatic. Eyes: EOMI. No discharge. Cardiovascular: RRR.  No JVD. Respiratory: crackles at Left base no resp distress, no wheezes GI: BS +. Non-distended. Musculoskeletal: She exhibits bilateral lower extremity edema Neurological: She is alert and oriented x1.  Delayed processing.  Motor: 4/5 bue and ble  Skin: Sacral wound dressed.  Psychiatric: Restless.  Confused.  Assessment/Plan: 1. 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  Stable for D/C today F/u PCP in 3-4 weeks F/u PM&R 2 weeks See D/C summary See D/C instructions Care Tool:  Bathing    Body parts bathed by patient: Right arm, Left arm, Chest, Abdomen, Face   Body parts bathed by helper: Front perineal area, Buttocks, Right upper leg, Left upper leg, Right lower leg, Left lower leg     Bathing assist Assist Level: Moderate Assistance - Patient 50 - 74%     Upper Body Dressing/Undressing Upper body dressing   What is the patient wearing?: Pull over shirt    Upper body assist Assist Level: Minimal Assistance - Patient > 75%    Lower Body Dressing/Undressing Lower body dressing      What is the  patient wearing?: Pants     Lower body assist Assist for lower body dressing: Moderate Assistance - Patient 50 - 74%     Toileting Toileting    Toileting assist Assist for toileting: Dependent - Patient 0%     Transfers Chair/bed transfer  Transfers assist     Chair/bed transfer assist level: Minimal Assistance - Patient > 75%     Locomotion Ambulation   Ambulation assist   Ambulation activity did not occur: Safety/medical concerns(apraxia)  Assist level: 2 helpers Assistive device: Other (comment)(3 muskateers ) Max distance: 14ft   Walk 10 feet activity   Assist     Assist level: 2 helpers Assistive device: Lite Gait   Walk 50 feet activity   Assist           Walk 150 feet activity   Assist           Walk 10 feet on uneven surface  activity   Assist           Wheelchair     Assist Will patient use wheelchair at discharge?: Yes Type of Wheelchair: Manual    Wheelchair assist level: Maximal Assistance - Patient 25 - 49% Max wheelchair distance: 15    Wheelchair 50 feet with 2 turns activity    Assist    Wheelchair 50 feet with 2 turns activity did not occur: Safety/medical concerns(fatigue)   Assist Level: Minimal Assistance - Patient > 75%   Wheelchair 150 feet activity     Assist Wheelchair 150 feet  activity did not occur: Safety/medical concerns(fatigue)         Medical Problem List and Plan: 1.  Limitations in mobility, self-care, endurance secondary to L>R infarcts and multiple medical issues.  -Continue CIR PT, OT, SLP-Plan D/C today2.  BLE DVTs/Anticoagulation: Pharmaceutical: Other (comment)-on Eliquis 3. Pain Management: tylenol prn.  4. Mood: LCSW to follow for evaluation and support.  5. Neuropsych: This patient is not capable of making decisions on her own behalf. 6. Skin/Wound Care: Air mattress due to sacral breakdown.   -hydrotherapy per PT- resume today , will not have this at home will cont  sanyl collagenase vs W to D sterile dressing at home 7. Fluids/Electrolytes/Nutrition:  Continue dysphagia 3 thin diet. -8. Empyema:off abx afebrile, no SOB 9. Malnutrition/Heavy alcohol use: Poor intake with 40 lbs weight loss.   -B12 and folate levels normal 10. Diarrhea: Added probiotic and fiber. Discontinued Ensure and added prostat.  Stools still mushy with incont  Overall improved, ? GI blood loss, one stool + and one stool neg, Hgb stable  -added imodium Reviewed chart has had workup with GI for abd pain, see MD at [facility] GI , CT abd showed non specific enteral wall thickening 11. Hypokalemia: Recheck on 11/22, normal  13.  Acute blood loss anemia  Hb 9.0 on 11/16 , 7.9  11/22- 8.3 11/25, no need for transfusion Recheck 12/2 stable   14.  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  Urinary retention,still requiring  ICP, on  Flomax, increase  Urecholine, 1 high vol cath, has had low volume cath given no void in 8 hr but this is likely intake related        16.  Agitation improved off seroquel  LOS: 23 days A FACE TO [place] E Kirsteins 04/09/2018, 7:34 AM

## 2018-04-09 NOTE — Progress Notes (Signed)
Patient and husband given discharge instructions and paperwork by Jeannene Patella, PA. Patient and husband voiced understanding. Patient discharged via [facility] with all her personal belongings.  Nicholes Rough, LPN

## 2018-04-09 NOTE — Discharge Summary (Addendum)
Physician Discharge Summary  Patient ID: Maria Avila MRN: [id number] DOB/AGE: 1951-04-08 67 y.o.  Admit date: 03/17/2018 Discharge date: 04/09/2018  Discharge Diagnoses:  Principal Problem:   Cerebral embolism with cerebral infarction Active Problems:   Anemia   Severe protein-calorie malnutrition (HCC)   Diarrhea   Benign essential HTN   Acute deep vein thrombosis (DVT) of distal vein of both lower extremities (HCC)   Acute blood loss anemia   Impulsive   Seizure prophylaxis--probable seizure   Sacral decubitus ulcer, stage IV ([place])   Discharged Condition: stable   Significant Diagnostic Studies: Dg Chest 2 View  Result Date: 04/02/2018 CLINICAL DATA:  Leukocytosis. EXAM: CHEST - 2 VIEW COMPARISON:  03/14/2018 FINDINGS: Lungs are adequately inflated with subtle residual density over the lateral left midlung likely atelectasis. No evidence of effusion. Cardiomediastinal silhouette and remainder of the exam is unchanged. IMPRESSION: No active cardiopulmonary disease. Electronically Signed   By: Marin Olp M.D.   On: 04/02/2018 17:01   Ct Head Wo Contrast  Result Date: 03/22/2018 CLINICAL DATA:  67 year old female with altered mental status. Short generalized seizure. Question hemorrhagic conversion of infarcts. Subsequent encounter. EXAM: CT HEAD WITHOUT CONTRAST TECHNIQUE: Contiguous axial images were obtained from the base of the skull through the vertex without intravenous contrast. COMPARISON:  03/20/2018 head CT and 03/10/2018 brain MR. FINDINGS: Brain: Left parietal lobe subacute infarct with slightly dense gyri which may represent laminar necrosis appear similar to the recent head CT. No intracranial hemorrhage. Remote MR detected subacute right paracentral pontine infarct and posterior right temporal lobe infarct not well delineated by CT. Chronic microvascular changes. No intracranial mass lesion noted on this unenhanced exam. Global atrophy. Vascular: Vascular  calcifications Skull: No acute abnormality.  Hyperostosis frontalis interna. Sinuses/Orbits: No acute orbital abnormality. Visualized paranasal sinuses are clear. Other: Mastoid air cells and middle ear cavities are clear. IMPRESSION: 1. Left parietal lobe subacute infarct with slightly dense gyri, which may represent laminar necrosis, appears similar to the recent head CT. 2. No intracranial hemorrhage. 3. Remote MR detected subacute right paracentral pontine infarct and posterior right temporal lobe infarct not well delineated by CT. 4. Chronic microvascular changes. 5. Global atrophy. Electronically Signed   By: Genia Del M.D.   On: 03/22/2018 10:55     Labs:  Basic Metabolic Panel: BMP Latest Ref Rng & Units 04/05/2018 03/26/2018 03/20/2018  Glucose 70 - 99 mg/dL 92 79 88  BUN 8 - 23 mg/dL 19 17 10   Creatinine 0.44 - 1.00 mg/dL 0.60 0.74 0.71  Sodium 135 - 145 mmol/L 143 141 139  Potassium 3.5 - 5.1 mmol/L 3.5 3.9 3.5  Chloride 98 - 111 mmol/L 111 113(H) 112(H)  CO2 22 - 32 mmol/L 26 24 23   Calcium 8.9 - 10.3 mg/dL 8.8(L) 8.5(L) 8.2(L)    CBC: CBC Latest Ref Rng & Units 04/05/2018 04/04/2018 04/03/2018  WBC 4.0 - 10.5 K/uL 10.7(H) 18.2(H) 24.0(H)  Hemoglobin 12.0 - 15.0 g/dL 7.7(L) 7.6(L) 7.9(L)  Hematocrit 36.0 - 46.0 % 24.8(L) 24.0(L) 24.9(L)  Platelets 150 - 400 K/uL 301 288 294    CBG: No results for input(s): GLUCAP in the last 168 hours.  Brief HPI:   Maria Avila is a 67 year old female with history of T2DM, HTN, heavy alcohol use, lymphocytosis/leocupenia, problems with colitis,N/V/D with 40 lbs weight loss and negative work up on 02/16/18 admission.  She was discharged to SNF for rehab but readmitted on 03/04/2018 with mental status changes, hypoxia and tachycardia due to  sepsis.  She was found to have near complete opacification of the left chest due to empyema.  She was intubated and left chest tube placed with drainage of purulent material.  Dr. Roxan Hockey recommended  Gastrografin swallow which was negative for perforation.  He felt that empyema was likely due to aspiration pneumonia and cultures positive for E. coli, enterococcus and pseudomonas aeruginosa.    Post extubation on 11 5 she was found to be lethargic with right-sided weakness, left gaze preference and mumbling speech.  CT perfusion done revealed left parietal lobe subacute infarct.  CTA head/neck was negative for large vessel occlusion.  Bilateral lower extremity Dopplers were positive for acute DVT in right posterior tibial vein and left peroneal veins.  She was started on IV heparin for treatment and transition to Xarelto.  Dr. Rigoberto Noel felt the stroke was embolic due to unknown source-/endocarditis versus hypercoagulable state due to sepsis. IV antibiotics narrowed to Levaquin at discharge.  Patient with visuospatial deficits, global weakness and moderate cognitive linguistic deficits affecting mobility and ADLs.  CIR was recommended for follow-up therapy.    Hospital Course: [name] was admitted to rehab 03/17/2018 for inpatient therapies to consist of PT, ST and OT at least three hours five days a week. Past admission physiatrist, therapy team and rehab RN have worked together to provide customized collaborative inpatient rehab.  Per recommendation she was transitioned to Levaquin for treatment of her empyema.  She did develop seizure type activity on 11/18 and the EEG done was negative for seizures.  Case discussed with neurology who felt that seizure threshold was decreased due to Levaquin and antibiotics  changed to Zosyn to complete her antibiotic regimen. He recommended starting Keppra as patient has risk for seizures due to her stroke. She completed 3-week course course on 11/21 but continues to have issues with diarrhea therefore Lomotil as well as fiber was added for bulking.    Foley was discontinued past admission and she was started on bladder bladder training.  She required in and out caths  due to urinary retention initially.  She was started on flomax and  low-dose Urecholine with resolution of retention.  She continues to be incontinent of bowel and bladder.   Her p.o. intake continued to be variable and nutritional supplements as well as vitamins were added to help with low calorie malnutrition as well as wound healing.  Air mattress overlay was ordered to help with pressure relief measures and patient and family has been educated on boosting every 20 minutes dependent chair. WOC was consulted for input and recommended hydrotherapy to help clear eschar.  Hydrotherapy was discontinued on 12 4 and his wound had progressed to a point where it was no longer needed.  Santyl with damp to dry dressing has been ongoing and patient has been referred to wound clinic for further care post discharge.  Potassium supplements were added due to hypokalemia felt to be due to poor nutritional intake as well as diarrhea .  Follow-up labs shows potassium levels within normal limits.   Acute blood loss anemia has been monitored and 1 out of 4 stools was heme positive.  She will need repeat CBC in 1 to 2 weeks to monitor for stability. Serial CBCs showed rise in WBC to 21 on 1129.  Chest x-ray was repeated and was negative for acute process.  Urine culture did grow out VRE but this was felt to be colonization and patient was monitored closely. She has been afebrile and  without  signs or symptoms of UTI. Follow up CBC showed is WBC normalizing.  Lethargy has resolved and she has been showing improvement in activity tolerance.  She does continue to be limited by cognitive deficits as well as debility requiring frequent rest breaks.  SNF was recommended extensive assistance needed however husband has declined this.  He was educated regarding all aspects of care, mobility and safety.  She currently requires mod assist and will continue to receive further follow-up HHPT, OT, speech therapy and RN by [place] after  discharge.     Rehab course: During patient's stay in rehab weekly team conferences were held to monitor patient's progress, set goals and discuss barriers to discharge. At admission, patient required close to total assist with basic ADL tasks and bed mobility. She exhibited mild oral phase dysphagia as well as cognitive deficits affecting basic problem-solving and ADLs. She  has had improvement in activity tolerance, balance, postural control as well as ability to compensate for deficits.  She requires mod assist with basic ADL tasks. She requires min to mod assist for bed mobility.  Min assist +2 with steady for transfers.  She is able to perform sliding board transfers with min assist.  She requires moderate assist for basic cognitive task due to ongoing issues with confusion and difficulty with processing.  Extensive education was completed with patient and husband regarding need for bed level ADLs as well as use of Hoyer lift for transfers.  Disposition:  Home  Diet: Regular  Special Instructions: 1. Wound Care:  Cleanse wound bed with saline. Then apply layer of santyl, cover with damp to dry dressing and tape.  2.  Need to attempt side-lying in bed.  Boost every 20 to 30 minutes when in chair. 3.  Offer supplements multiple times during the day to help with protein needs 4. Needs CBC/BMET repeated in 1-2 weeks to monitor renal status, potassium as well as H/H.    Discharge Instructions    Ambulatory referral to Physical Medicine Rehab   Complete by:  As directed    Ambulatory referral to Wound Clinic   Complete by:  As directed    Has stage 4 sacral wound.     Allergies as of 04/09/2018      Reactions   Crestor [rosuvastatin Calcium] Swelling, Other (See Comments)   Swelling of legs and muscle weakness   Zocor [simvastatin] Swelling, Other (See Comments)   Swelling of legs and muscle weakness      Medication List    STOP taking these medications   feeding supplement (ENSURE  ENLIVE) Liqd   Fish Oil 1000 MG Caps   levofloxacin 750 MG tablet Commonly known as:  LEVAQUIN   ondansetron 4 MG disintegrating tablet Commonly known as:  ZOFRAN-ODT   Pancrelipase (Lip-Prot-Amyl) 24000-76000 units Cpep   pantoprazole 40 MG tablet Commonly known as:  PROTONIX   pravastatin 40 MG tablet Commonly known as:  PRAVACHOL   promethazine 12.5 MG tablet Commonly known as:  PHENERGAN     TAKE these medications   acetaminophen 325 MG tablet Commonly known as:  TYLENOL Take 1-2 tablets (325-650 mg total) by mouth every 4 (four) hours as needed for mild pain.   apixaban 5 MG Tabs tablet Commonly known as:  ELIQUIS Take 1 tablet (5 mg total) by mouth 2 (two) times daily. What changed:    how much to take  how to take this  when to take this  additional instructions  ascorbic acid 500 MG tablet Commonly known as:  VITAMIN C Take 1 tablet (500 mg total) by mouth 2 (two) times daily.   bethanechol 10 MG tablet Commonly known as:  URECHOLINE Take 1 tablet (10 mg total) by mouth 3 (three) times daily.   bisacodyl 10 MG suppository Commonly known as:  DULCOLAX Place 1 suppository (10 mg total) rectally daily as needed for moderate constipation or severe constipation.   collagenase ointment Commonly known as:  SANTYL Apply topically daily. To ulcer then cover with damp to dry dressing. What changed:  additional instructions   famotidine 10 MG tablet Commonly known as:  PEPCID Take 1 tablet (10 mg total) by mouth 2 (two) times daily.   feeding supplement (PRO-STAT SUGAR FREE 64) Liqd Take 30 mLs by mouth 2 (two) times daily.   Gerhardt's butt cream Crea Apply 1 application topically daily. Notes to patient:  Can use Desitin or vitamin D    lactobacillus acidophilus & bulgar chewable tablet Chew 1 tablet by mouth 3 (three) times daily with meals.   levETIRAcetam 500 MG tablet Commonly known as:  KEPPRA Take 1 tablet (500 mg total) by mouth 2 (two)  times daily.   loperamide 2 MG capsule Commonly known as:  IMODIUM Take 2 capsules (4 mg total) by mouth 2 (two) times daily.   multivitamin capsule Take 1 capsule by mouth daily.   polycarbophil 625 MG tablet Commonly known as:  FIBERCON Take 2 tablets (1,250 mg total) by mouth 2 (two) times daily.   potassium chloride SA 20 MEQ tablet Commonly known as:  K-DUR,KLOR-CON Take 1 tablet (20 mEq total) by mouth 2 (two) times daily.   tamsulosin 0.4 MG Caps capsule Commonly known as:  FLOMAX Take 1 capsule (0.4 mg total) by mouth daily after supper.   thiamine 100 MG tablet Take 1 tablet (100 mg total) by mouth daily.   Vitamin D 50 MCG (2000 UT) Caps Take 2,000 Units by mouth daily.   zinc sulfate 220 (50 Zn) MG capsule Take 1 capsule (220 mg total) by mouth daily.      Follow-up Information    [name], MD Follow up on 04/21/2018.   Specialty:  Internal Medicine Why:  Appointment @ 2:00 PM Contact information: [address] [place] [address] [phone]        Charlett Blake, MD Follow up.   Specialty:  Physical Medicine and Rehabilitation Why:  office will call you with follow up appointment Contact information: [place] [id number] [place]              Follow up on 04/20/2018.   Why:  appointment at 12:30 pm/be there at 12;15 pm for  Contact information: [place]. [place] [phone]          Signed: Bary Leriche 04/12/2018, 11:35 AM

## 2018-04-11 DIAGNOSIS — Z7901 Long term (current) use of anticoagulants: Secondary | ICD-10-CM | POA: Diagnosis not present

## 2018-04-11 DIAGNOSIS — I69391 Dysphagia following cerebral infarction: Secondary | ICD-10-CM | POA: Diagnosis not present

## 2018-04-11 DIAGNOSIS — Z4800 Encounter for change or removal of nonsurgical wound dressing: Secondary | ICD-10-CM | POA: Diagnosis not present

## 2018-04-11 DIAGNOSIS — I69398 Other sequelae of cerebral infarction: Secondary | ICD-10-CM | POA: Diagnosis not present

## 2018-04-11 DIAGNOSIS — K529 Noninfective gastroenteritis and colitis, unspecified: Secondary | ICD-10-CM | POA: Diagnosis not present

## 2018-04-11 DIAGNOSIS — I82403 Acute embolism and thrombosis of unspecified deep veins of lower extremity, bilateral: Secondary | ICD-10-CM | POA: Diagnosis not present

## 2018-04-11 DIAGNOSIS — D72820 Lymphocytosis (symptomatic): Secondary | ICD-10-CM | POA: Diagnosis not present

## 2018-04-11 DIAGNOSIS — I10 Essential (primary) hypertension: Secondary | ICD-10-CM | POA: Diagnosis not present

## 2018-04-11 DIAGNOSIS — Z87891 Personal history of nicotine dependence: Secondary | ICD-10-CM | POA: Diagnosis not present

## 2018-04-11 DIAGNOSIS — R1310 Dysphagia, unspecified: Secondary | ICD-10-CM | POA: Diagnosis not present

## 2018-04-11 DIAGNOSIS — R413 Other amnesia: Secondary | ICD-10-CM | POA: Diagnosis not present

## 2018-04-11 DIAGNOSIS — E119 Type 2 diabetes mellitus without complications: Secondary | ICD-10-CM | POA: Diagnosis not present

## 2018-04-11 DIAGNOSIS — I69319 Unspecified symptoms and signs involving cognitive functions following cerebral infarction: Secondary | ICD-10-CM | POA: Diagnosis not present

## 2018-04-11 DIAGNOSIS — Z7409 Other reduced mobility: Secondary | ICD-10-CM | POA: Diagnosis not present

## 2018-04-11 DIAGNOSIS — R569 Unspecified convulsions: Secondary | ICD-10-CM | POA: Diagnosis not present

## 2018-04-11 DIAGNOSIS — L89153 Pressure ulcer of sacral region, stage 3: Secondary | ICD-10-CM | POA: Diagnosis not present

## 2018-04-11 DIAGNOSIS — D62 Acute posthemorrhagic anemia: Secondary | ICD-10-CM | POA: Diagnosis not present

## 2018-04-11 DIAGNOSIS — S41102D Unspecified open wound of left upper arm, subsequent encounter: Secondary | ICD-10-CM | POA: Diagnosis not present

## 2018-04-11 DIAGNOSIS — D72819 Decreased white blood cell count, unspecified: Secondary | ICD-10-CM | POA: Diagnosis not present

## 2018-04-11 DIAGNOSIS — E46 Unspecified protein-calorie malnutrition: Secondary | ICD-10-CM | POA: Diagnosis not present

## 2018-04-12 DIAGNOSIS — D72820 Lymphocytosis (symptomatic): Secondary | ICD-10-CM | POA: Diagnosis not present

## 2018-04-12 DIAGNOSIS — R413 Other amnesia: Secondary | ICD-10-CM | POA: Diagnosis not present

## 2018-04-12 DIAGNOSIS — L89154 Pressure ulcer of sacral region, stage 4: Secondary | ICD-10-CM

## 2018-04-12 DIAGNOSIS — D62 Acute posthemorrhagic anemia: Secondary | ICD-10-CM | POA: Diagnosis not present

## 2018-04-12 DIAGNOSIS — E119 Type 2 diabetes mellitus without complications: Secondary | ICD-10-CM | POA: Diagnosis not present

## 2018-04-12 DIAGNOSIS — E46 Unspecified protein-calorie malnutrition: Secondary | ICD-10-CM | POA: Diagnosis not present

## 2018-04-12 DIAGNOSIS — L89153 Pressure ulcer of sacral region, stage 3: Secondary | ICD-10-CM | POA: Diagnosis not present

## 2018-04-12 DIAGNOSIS — Z4800 Encounter for change or removal of nonsurgical wound dressing: Secondary | ICD-10-CM | POA: Diagnosis not present

## 2018-04-12 DIAGNOSIS — D72819 Decreased white blood cell count, unspecified: Secondary | ICD-10-CM | POA: Diagnosis not present

## 2018-04-12 DIAGNOSIS — I10 Essential (primary) hypertension: Secondary | ICD-10-CM | POA: Diagnosis not present

## 2018-04-12 DIAGNOSIS — I69398 Other sequelae of cerebral infarction: Secondary | ICD-10-CM | POA: Diagnosis not present

## 2018-04-12 DIAGNOSIS — K529 Noninfective gastroenteritis and colitis, unspecified: Secondary | ICD-10-CM | POA: Diagnosis not present

## 2018-04-12 DIAGNOSIS — R569 Unspecified convulsions: Secondary | ICD-10-CM | POA: Diagnosis not present

## 2018-04-12 DIAGNOSIS — Z87891 Personal history of nicotine dependence: Secondary | ICD-10-CM | POA: Diagnosis not present

## 2018-04-12 DIAGNOSIS — S41102D Unspecified open wound of left upper arm, subsequent encounter: Secondary | ICD-10-CM | POA: Diagnosis not present

## 2018-04-12 DIAGNOSIS — R1310 Dysphagia, unspecified: Secondary | ICD-10-CM | POA: Diagnosis not present

## 2018-04-12 DIAGNOSIS — I69319 Unspecified symptoms and signs involving cognitive functions following cerebral infarction: Secondary | ICD-10-CM | POA: Diagnosis not present

## 2018-04-12 DIAGNOSIS — I82403 Acute embolism and thrombosis of unspecified deep veins of lower extremity, bilateral: Secondary | ICD-10-CM | POA: Diagnosis not present

## 2018-04-12 DIAGNOSIS — Z298 Encounter for other specified prophylactic measures: Secondary | ICD-10-CM

## 2018-04-12 DIAGNOSIS — Z7901 Long term (current) use of anticoagulants: Secondary | ICD-10-CM | POA: Diagnosis not present

## 2018-04-12 DIAGNOSIS — Z7409 Other reduced mobility: Secondary | ICD-10-CM | POA: Diagnosis not present

## 2018-04-12 DIAGNOSIS — I69391 Dysphagia following cerebral infarction: Secondary | ICD-10-CM | POA: Diagnosis not present

## 2018-04-13 ENCOUNTER — Telehealth: Payer: Self-pay

## 2018-04-13 DIAGNOSIS — I69398 Other sequelae of cerebral infarction: Secondary | ICD-10-CM | POA: Diagnosis not present

## 2018-04-13 DIAGNOSIS — Z4800 Encounter for change or removal of nonsurgical wound dressing: Secondary | ICD-10-CM | POA: Diagnosis not present

## 2018-04-13 DIAGNOSIS — I69391 Dysphagia following cerebral infarction: Secondary | ICD-10-CM | POA: Diagnosis not present

## 2018-04-13 DIAGNOSIS — D72820 Lymphocytosis (symptomatic): Secondary | ICD-10-CM | POA: Diagnosis not present

## 2018-04-13 DIAGNOSIS — R569 Unspecified convulsions: Secondary | ICD-10-CM | POA: Diagnosis not present

## 2018-04-13 DIAGNOSIS — S41102D Unspecified open wound of left upper arm, subsequent encounter: Secondary | ICD-10-CM | POA: Diagnosis not present

## 2018-04-13 DIAGNOSIS — D72819 Decreased white blood cell count, unspecified: Secondary | ICD-10-CM | POA: Diagnosis not present

## 2018-04-13 DIAGNOSIS — K529 Noninfective gastroenteritis and colitis, unspecified: Secondary | ICD-10-CM | POA: Diagnosis not present

## 2018-04-13 DIAGNOSIS — Z7901 Long term (current) use of anticoagulants: Secondary | ICD-10-CM | POA: Diagnosis not present

## 2018-04-13 DIAGNOSIS — I69319 Unspecified symptoms and signs involving cognitive functions following cerebral infarction: Secondary | ICD-10-CM | POA: Diagnosis not present

## 2018-04-13 DIAGNOSIS — R1310 Dysphagia, unspecified: Secondary | ICD-10-CM | POA: Diagnosis not present

## 2018-04-13 DIAGNOSIS — R413 Other amnesia: Secondary | ICD-10-CM | POA: Diagnosis not present

## 2018-04-13 DIAGNOSIS — E46 Unspecified protein-calorie malnutrition: Secondary | ICD-10-CM | POA: Diagnosis not present

## 2018-04-13 DIAGNOSIS — Z87891 Personal history of nicotine dependence: Secondary | ICD-10-CM | POA: Diagnosis not present

## 2018-04-13 DIAGNOSIS — I82403 Acute embolism and thrombosis of unspecified deep veins of lower extremity, bilateral: Secondary | ICD-10-CM | POA: Diagnosis not present

## 2018-04-13 DIAGNOSIS — E119 Type 2 diabetes mellitus without complications: Secondary | ICD-10-CM | POA: Diagnosis not present

## 2018-04-13 DIAGNOSIS — D62 Acute posthemorrhagic anemia: Secondary | ICD-10-CM | POA: Diagnosis not present

## 2018-04-13 DIAGNOSIS — L89153 Pressure ulcer of sacral region, stage 3: Secondary | ICD-10-CM | POA: Diagnosis not present

## 2018-04-13 DIAGNOSIS — Z7409 Other reduced mobility: Secondary | ICD-10-CM | POA: Diagnosis not present

## 2018-04-13 DIAGNOSIS — I10 Essential (primary) hypertension: Secondary | ICD-10-CM | POA: Diagnosis not present

## 2018-04-13 NOTE — Telephone Encounter (Signed)
Ebony Hail, SP from [facility] called requesting verbal orders 1wk4 and to verify if pt is Dr. Letta Pate or Dr. Naaman Plummer. Tried to call Ebony Hail back and mailbox is full

## 2018-04-14 ENCOUNTER — Telehealth: Payer: Self-pay

## 2018-04-14 DIAGNOSIS — I69398 Other sequelae of cerebral infarction: Secondary | ICD-10-CM | POA: Diagnosis not present

## 2018-04-14 DIAGNOSIS — Z87891 Personal history of nicotine dependence: Secondary | ICD-10-CM | POA: Diagnosis not present

## 2018-04-14 DIAGNOSIS — E119 Type 2 diabetes mellitus without complications: Secondary | ICD-10-CM | POA: Diagnosis not present

## 2018-04-14 DIAGNOSIS — I10 Essential (primary) hypertension: Secondary | ICD-10-CM | POA: Diagnosis not present

## 2018-04-14 DIAGNOSIS — R413 Other amnesia: Secondary | ICD-10-CM | POA: Diagnosis not present

## 2018-04-14 DIAGNOSIS — D62 Acute posthemorrhagic anemia: Secondary | ICD-10-CM | POA: Diagnosis not present

## 2018-04-14 DIAGNOSIS — L89153 Pressure ulcer of sacral region, stage 3: Secondary | ICD-10-CM | POA: Diagnosis not present

## 2018-04-14 DIAGNOSIS — S41102D Unspecified open wound of left upper arm, subsequent encounter: Secondary | ICD-10-CM | POA: Diagnosis not present

## 2018-04-14 DIAGNOSIS — K529 Noninfective gastroenteritis and colitis, unspecified: Secondary | ICD-10-CM | POA: Diagnosis not present

## 2018-04-14 DIAGNOSIS — R1310 Dysphagia, unspecified: Secondary | ICD-10-CM | POA: Diagnosis not present

## 2018-04-14 DIAGNOSIS — I69319 Unspecified symptoms and signs involving cognitive functions following cerebral infarction: Secondary | ICD-10-CM | POA: Diagnosis not present

## 2018-04-14 DIAGNOSIS — D72819 Decreased white blood cell count, unspecified: Secondary | ICD-10-CM | POA: Diagnosis not present

## 2018-04-14 DIAGNOSIS — I69391 Dysphagia following cerebral infarction: Secondary | ICD-10-CM | POA: Diagnosis not present

## 2018-04-14 DIAGNOSIS — Z4800 Encounter for change or removal of nonsurgical wound dressing: Secondary | ICD-10-CM | POA: Diagnosis not present

## 2018-04-14 DIAGNOSIS — E46 Unspecified protein-calorie malnutrition: Secondary | ICD-10-CM | POA: Diagnosis not present

## 2018-04-14 DIAGNOSIS — R569 Unspecified convulsions: Secondary | ICD-10-CM | POA: Diagnosis not present

## 2018-04-14 DIAGNOSIS — Z7409 Other reduced mobility: Secondary | ICD-10-CM | POA: Diagnosis not present

## 2018-04-14 DIAGNOSIS — Z7901 Long term (current) use of anticoagulants: Secondary | ICD-10-CM | POA: Diagnosis not present

## 2018-04-14 DIAGNOSIS — D72820 Lymphocytosis (symptomatic): Secondary | ICD-10-CM | POA: Diagnosis not present

## 2018-04-14 DIAGNOSIS — I82403 Acute embolism and thrombosis of unspecified deep veins of lower extremity, bilateral: Secondary | ICD-10-CM | POA: Diagnosis not present

## 2018-04-14 NOTE — Telephone Encounter (Signed)
Allison notified. 

## 2018-04-14 NOTE — Telephone Encounter (Signed)
Wells Guiles, RN from [facility] called requesting verbal orders for PT 2wk3. Approved and given orders per discharge summary.

## 2018-04-15 DIAGNOSIS — I82403 Acute embolism and thrombosis of unspecified deep veins of lower extremity, bilateral: Secondary | ICD-10-CM | POA: Diagnosis not present

## 2018-04-15 DIAGNOSIS — E46 Unspecified protein-calorie malnutrition: Secondary | ICD-10-CM | POA: Diagnosis not present

## 2018-04-15 DIAGNOSIS — D72820 Lymphocytosis (symptomatic): Secondary | ICD-10-CM | POA: Diagnosis not present

## 2018-04-15 DIAGNOSIS — Z4800 Encounter for change or removal of nonsurgical wound dressing: Secondary | ICD-10-CM | POA: Diagnosis not present

## 2018-04-15 DIAGNOSIS — I69319 Unspecified symptoms and signs involving cognitive functions following cerebral infarction: Secondary | ICD-10-CM | POA: Diagnosis not present

## 2018-04-15 DIAGNOSIS — Z87891 Personal history of nicotine dependence: Secondary | ICD-10-CM | POA: Diagnosis not present

## 2018-04-15 DIAGNOSIS — I69391 Dysphagia following cerebral infarction: Secondary | ICD-10-CM | POA: Diagnosis not present

## 2018-04-15 DIAGNOSIS — R413 Other amnesia: Secondary | ICD-10-CM | POA: Diagnosis not present

## 2018-04-15 DIAGNOSIS — Z7409 Other reduced mobility: Secondary | ICD-10-CM | POA: Diagnosis not present

## 2018-04-15 DIAGNOSIS — L89153 Pressure ulcer of sacral region, stage 3: Secondary | ICD-10-CM | POA: Diagnosis not present

## 2018-04-15 DIAGNOSIS — S41102D Unspecified open wound of left upper arm, subsequent encounter: Secondary | ICD-10-CM | POA: Diagnosis not present

## 2018-04-15 DIAGNOSIS — D62 Acute posthemorrhagic anemia: Secondary | ICD-10-CM | POA: Diagnosis not present

## 2018-04-15 DIAGNOSIS — I10 Essential (primary) hypertension: Secondary | ICD-10-CM | POA: Diagnosis not present

## 2018-04-15 DIAGNOSIS — R1310 Dysphagia, unspecified: Secondary | ICD-10-CM | POA: Diagnosis not present

## 2018-04-15 DIAGNOSIS — D72819 Decreased white blood cell count, unspecified: Secondary | ICD-10-CM | POA: Diagnosis not present

## 2018-04-15 DIAGNOSIS — R569 Unspecified convulsions: Secondary | ICD-10-CM | POA: Diagnosis not present

## 2018-04-15 DIAGNOSIS — I69398 Other sequelae of cerebral infarction: Secondary | ICD-10-CM | POA: Diagnosis not present

## 2018-04-15 DIAGNOSIS — K529 Noninfective gastroenteritis and colitis, unspecified: Secondary | ICD-10-CM | POA: Diagnosis not present

## 2018-04-15 DIAGNOSIS — E119 Type 2 diabetes mellitus without complications: Secondary | ICD-10-CM | POA: Diagnosis not present

## 2018-04-15 DIAGNOSIS — I639 Cerebral infarction, unspecified: Secondary | ICD-10-CM | POA: Diagnosis not present

## 2018-04-15 DIAGNOSIS — Z7901 Long term (current) use of anticoagulants: Secondary | ICD-10-CM | POA: Diagnosis not present

## 2018-04-15 NOTE — Discharge Instructions (Signed)
Inpatient Rehab Discharge Instructions  Maria Avila Discharge date and time: 04/09/18   Activities/Precautions/ Functional Status: Activity: no lifting, driving, or strenuous exercise for till cleared by MD Diet: regular diet Wound Care:  Cleanse wound bed with saline. Then apply layer of santyl, cover with damp to dry dressing and tape.    Functional status:  ___ No restrictions     ___ Walk up steps independently _X__ 24/7 supervision/assistance   ___ Walk up steps with assistance ___ Intermittent supervision/assistance  ___ Bathe/dress independently ___ Walk with walker     ___ Bathe/dress with assistance ___ Walk Independently    ___ Shower independently ___ Walk with assistance    ___ Shower with assistance _X__ No alcohol     ___ Return to work/school ________   Special Instructions: 1. Needs to boost every 20-30 minutes when in chair. Try to side lie when in bed. 2. Offer supplements during the day to help with protein needs.    GENERAL COMMUNITY RESOURCES FOR PATIENT/FAMILY: Support Groups:[facility] SUPPORT GROUP THE SECOND Thursday @ 6:00-7:00 PM ON THE REHAB UNIT QUESTIONS CALL AMY [phone]  My questions have been answered and I understand these instructions. I will adhere to these goals and the provided educational materials after my discharge from the hospital.  Patient/Caregiver Signature _______________________________ Date __________  Clinician Signature _______________________________________ Date __________  Please bring this form and your medication list with you to all your follow-up doctor's appointments.    Information on my medicine - ELIQUIS (apixaban)  This medication education was reviewed with me or my healthcare representative as part of my discharge preparation.  The pharmacist that spoke with me during my hospital stay was:  Onnie Boer, RPH-CPP  Why was Eliquis prescribed for you? Eliquis was prescribed to treat blood clots that may have  been found in the veins of your legs (deep vein thrombosis) or in your lungs (pulmonary embolism) and to reduce the risk of them occurring again.  What do You need to know about Eliquis ? The starting dose is 10 mg (two 5 mg tablets) taken TWICE daily for the FIRST SEVEN (7) DAYS, then on (enter date)  03/20/18  the dose is reduced to ONE 5 mg tablet taken TWICE daily.  Eliquis may be taken with or without food.   Try to take the dose about the same time in the morning and in the evening. If you have difficulty swallowing the tablet whole please discuss with your pharmacist how to take the medication safely.  Take Eliquis exactly as prescribed and DO NOT stop taking Eliquis without talking to the doctor who prescribed the medication.  Stopping may increase your risk of developing a new blood clot.  Refill your prescription before you run out.  After discharge, you should have regular check-up appointments with your healthcare provider that is prescribing your Eliquis.    What do you do if you miss a dose? If a dose of ELIQUIS is not taken at the scheduled time, take it as soon as possible on the same day and twice-daily administration should be resumed. The dose should not be doubled to make up for a missed dose.  Important Safety Information A possible side effect of Eliquis is bleeding. You should call your healthcare provider right away if you experience any of the following: ? Bleeding from an injury or your nose that does not stop. ? Unusual colored urine (red or dark brown) or unusual colored stools (red or black). ? Unusual bruising  for unknown reasons. ? A serious fall or if you hit your head (even if there is no bleeding).  Some medicines may interact with Eliquis and might increase your risk of bleeding or clotting while on Eliquis. To help avoid this, consult your healthcare provider or pharmacist prior to using any new prescription or non-prescription medications, including  herbals, vitamins, non-steroidal anti-inflammatory drugs (NSAIDs) and supplements.  This website has more information on Eliquis (apixaban): http://www.eliquis.com/eliquis/home   COMMUNITY REFERRALS UPON DISCHARGE:    Home Health:   PT, OT, SP, RN, [place]   Date of last service:04/09/2018  Medical Equipment/Items Ordered:HOSPITAL BED-LOW AIR LOSS MATTRESS, SPECIALIZED TILT AND SPACE WHEELCHAIR, WIDE DROP-ARM BEDSIDE COMMODE, HOYER LIFT  Agency/Supplier:[facility]   [phone] OTHER: SCAT APPLICATION COMPLETED WILL CONTACT TO SET UP INTERVIEW-[phone]

## 2018-04-16 ENCOUNTER — Telehealth: Payer: Self-pay | Admitting: *Deleted

## 2018-04-16 DIAGNOSIS — E46 Unspecified protein-calorie malnutrition: Secondary | ICD-10-CM | POA: Diagnosis not present

## 2018-04-16 DIAGNOSIS — Z7409 Other reduced mobility: Secondary | ICD-10-CM | POA: Diagnosis not present

## 2018-04-16 DIAGNOSIS — R1310 Dysphagia, unspecified: Secondary | ICD-10-CM | POA: Diagnosis not present

## 2018-04-16 DIAGNOSIS — D62 Acute posthemorrhagic anemia: Secondary | ICD-10-CM | POA: Diagnosis not present

## 2018-04-16 DIAGNOSIS — I69398 Other sequelae of cerebral infarction: Secondary | ICD-10-CM | POA: Diagnosis not present

## 2018-04-16 DIAGNOSIS — Z87891 Personal history of nicotine dependence: Secondary | ICD-10-CM | POA: Diagnosis not present

## 2018-04-16 DIAGNOSIS — Z4800 Encounter for change or removal of nonsurgical wound dressing: Secondary | ICD-10-CM | POA: Diagnosis not present

## 2018-04-16 DIAGNOSIS — S41102D Unspecified open wound of left upper arm, subsequent encounter: Secondary | ICD-10-CM | POA: Diagnosis not present

## 2018-04-16 DIAGNOSIS — I69391 Dysphagia following cerebral infarction: Secondary | ICD-10-CM | POA: Diagnosis not present

## 2018-04-16 DIAGNOSIS — K529 Noninfective gastroenteritis and colitis, unspecified: Secondary | ICD-10-CM | POA: Diagnosis not present

## 2018-04-16 DIAGNOSIS — Z7901 Long term (current) use of anticoagulants: Secondary | ICD-10-CM | POA: Diagnosis not present

## 2018-04-16 DIAGNOSIS — D72819 Decreased white blood cell count, unspecified: Secondary | ICD-10-CM | POA: Diagnosis not present

## 2018-04-16 DIAGNOSIS — I69319 Unspecified symptoms and signs involving cognitive functions following cerebral infarction: Secondary | ICD-10-CM | POA: Diagnosis not present

## 2018-04-16 DIAGNOSIS — L89153 Pressure ulcer of sacral region, stage 3: Secondary | ICD-10-CM | POA: Diagnosis not present

## 2018-04-16 DIAGNOSIS — R569 Unspecified convulsions: Secondary | ICD-10-CM | POA: Diagnosis not present

## 2018-04-16 DIAGNOSIS — I82403 Acute embolism and thrombosis of unspecified deep veins of lower extremity, bilateral: Secondary | ICD-10-CM | POA: Diagnosis not present

## 2018-04-16 DIAGNOSIS — I10 Essential (primary) hypertension: Secondary | ICD-10-CM | POA: Diagnosis not present

## 2018-04-16 DIAGNOSIS — D72820 Lymphocytosis (symptomatic): Secondary | ICD-10-CM | POA: Diagnosis not present

## 2018-04-16 DIAGNOSIS — E119 Type 2 diabetes mellitus without complications: Secondary | ICD-10-CM | POA: Diagnosis not present

## 2018-04-16 DIAGNOSIS — R413 Other amnesia: Secondary | ICD-10-CM | POA: Diagnosis not present

## 2018-04-16 NOTE — Telephone Encounter (Signed)
[name] PT [facility] called for POC for 1wk1, 2wk 3.  Approval given.

## 2018-04-18 DIAGNOSIS — L89154 Pressure ulcer of sacral region, stage 4: Secondary | ICD-10-CM | POA: Diagnosis not present

## 2018-04-19 DIAGNOSIS — R1310 Dysphagia, unspecified: Secondary | ICD-10-CM | POA: Diagnosis not present

## 2018-04-19 DIAGNOSIS — Z4800 Encounter for change or removal of nonsurgical wound dressing: Secondary | ICD-10-CM | POA: Diagnosis not present

## 2018-04-19 DIAGNOSIS — R413 Other amnesia: Secondary | ICD-10-CM | POA: Diagnosis not present

## 2018-04-19 DIAGNOSIS — I69398 Other sequelae of cerebral infarction: Secondary | ICD-10-CM | POA: Diagnosis not present

## 2018-04-19 DIAGNOSIS — I69319 Unspecified symptoms and signs involving cognitive functions following cerebral infarction: Secondary | ICD-10-CM | POA: Diagnosis not present

## 2018-04-19 DIAGNOSIS — D62 Acute posthemorrhagic anemia: Secondary | ICD-10-CM | POA: Diagnosis not present

## 2018-04-19 DIAGNOSIS — D72819 Decreased white blood cell count, unspecified: Secondary | ICD-10-CM | POA: Diagnosis not present

## 2018-04-19 DIAGNOSIS — R569 Unspecified convulsions: Secondary | ICD-10-CM | POA: Diagnosis not present

## 2018-04-19 DIAGNOSIS — Z87891 Personal history of nicotine dependence: Secondary | ICD-10-CM | POA: Diagnosis not present

## 2018-04-19 DIAGNOSIS — I10 Essential (primary) hypertension: Secondary | ICD-10-CM | POA: Diagnosis not present

## 2018-04-19 DIAGNOSIS — Z7409 Other reduced mobility: Secondary | ICD-10-CM | POA: Diagnosis not present

## 2018-04-19 DIAGNOSIS — K529 Noninfective gastroenteritis and colitis, unspecified: Secondary | ICD-10-CM | POA: Diagnosis not present

## 2018-04-19 DIAGNOSIS — D72820 Lymphocytosis (symptomatic): Secondary | ICD-10-CM | POA: Diagnosis not present

## 2018-04-19 DIAGNOSIS — I82403 Acute embolism and thrombosis of unspecified deep veins of lower extremity, bilateral: Secondary | ICD-10-CM | POA: Diagnosis not present

## 2018-04-19 DIAGNOSIS — S41102D Unspecified open wound of left upper arm, subsequent encounter: Secondary | ICD-10-CM | POA: Diagnosis not present

## 2018-04-19 DIAGNOSIS — L89153 Pressure ulcer of sacral region, stage 3: Secondary | ICD-10-CM | POA: Diagnosis not present

## 2018-04-19 DIAGNOSIS — E46 Unspecified protein-calorie malnutrition: Secondary | ICD-10-CM | POA: Diagnosis not present

## 2018-04-19 DIAGNOSIS — Z7901 Long term (current) use of anticoagulants: Secondary | ICD-10-CM | POA: Diagnosis not present

## 2018-04-19 DIAGNOSIS — I69391 Dysphagia following cerebral infarction: Secondary | ICD-10-CM | POA: Diagnosis not present

## 2018-04-19 DIAGNOSIS — E119 Type 2 diabetes mellitus without complications: Secondary | ICD-10-CM | POA: Diagnosis not present

## 2018-04-20 ENCOUNTER — Encounter (HOSPITAL_BASED_OUTPATIENT_CLINIC_OR_DEPARTMENT_OTHER): Payer: Medicare Other | Attending: Internal Medicine

## 2018-04-20 DIAGNOSIS — I10 Essential (primary) hypertension: Secondary | ICD-10-CM | POA: Insufficient documentation

## 2018-04-20 DIAGNOSIS — L89154 Pressure ulcer of sacral region, stage 4: Secondary | ICD-10-CM | POA: Diagnosis not present

## 2018-04-20 DIAGNOSIS — E1136 Type 2 diabetes mellitus with diabetic cataract: Secondary | ICD-10-CM | POA: Diagnosis not present

## 2018-04-20 DIAGNOSIS — Z86718 Personal history of other venous thrombosis and embolism: Secondary | ICD-10-CM | POA: Insufficient documentation

## 2018-04-20 DIAGNOSIS — Z87891 Personal history of nicotine dependence: Secondary | ICD-10-CM | POA: Diagnosis not present

## 2018-04-20 DIAGNOSIS — I69354 Hemiplegia and hemiparesis following cerebral infarction affecting left non-dominant side: Secondary | ICD-10-CM | POA: Diagnosis not present

## 2018-04-22 DIAGNOSIS — I10 Essential (primary) hypertension: Secondary | ICD-10-CM | POA: Diagnosis not present

## 2018-04-22 DIAGNOSIS — R1310 Dysphagia, unspecified: Secondary | ICD-10-CM | POA: Diagnosis not present

## 2018-04-22 DIAGNOSIS — I82403 Acute embolism and thrombosis of unspecified deep veins of lower extremity, bilateral: Secondary | ICD-10-CM | POA: Diagnosis not present

## 2018-04-22 DIAGNOSIS — R569 Unspecified convulsions: Secondary | ICD-10-CM | POA: Diagnosis not present

## 2018-04-22 DIAGNOSIS — Z7901 Long term (current) use of anticoagulants: Secondary | ICD-10-CM | POA: Diagnosis not present

## 2018-04-22 DIAGNOSIS — I69319 Unspecified symptoms and signs involving cognitive functions following cerebral infarction: Secondary | ICD-10-CM | POA: Diagnosis not present

## 2018-04-22 DIAGNOSIS — Z7409 Other reduced mobility: Secondary | ICD-10-CM | POA: Diagnosis not present

## 2018-04-22 DIAGNOSIS — D72819 Decreased white blood cell count, unspecified: Secondary | ICD-10-CM | POA: Diagnosis not present

## 2018-04-22 DIAGNOSIS — K529 Noninfective gastroenteritis and colitis, unspecified: Secondary | ICD-10-CM | POA: Diagnosis not present

## 2018-04-22 DIAGNOSIS — D62 Acute posthemorrhagic anemia: Secondary | ICD-10-CM | POA: Diagnosis not present

## 2018-04-22 DIAGNOSIS — I69391 Dysphagia following cerebral infarction: Secondary | ICD-10-CM | POA: Diagnosis not present

## 2018-04-22 DIAGNOSIS — E119 Type 2 diabetes mellitus without complications: Secondary | ICD-10-CM | POA: Diagnosis not present

## 2018-04-22 DIAGNOSIS — I69398 Other sequelae of cerebral infarction: Secondary | ICD-10-CM | POA: Diagnosis not present

## 2018-04-22 DIAGNOSIS — Z4800 Encounter for change or removal of nonsurgical wound dressing: Secondary | ICD-10-CM | POA: Diagnosis not present

## 2018-04-22 DIAGNOSIS — L89153 Pressure ulcer of sacral region, stage 3: Secondary | ICD-10-CM | POA: Diagnosis not present

## 2018-04-22 DIAGNOSIS — E46 Unspecified protein-calorie malnutrition: Secondary | ICD-10-CM | POA: Diagnosis not present

## 2018-04-22 DIAGNOSIS — S41102D Unspecified open wound of left upper arm, subsequent encounter: Secondary | ICD-10-CM | POA: Diagnosis not present

## 2018-04-22 DIAGNOSIS — D72820 Lymphocytosis (symptomatic): Secondary | ICD-10-CM | POA: Diagnosis not present

## 2018-04-22 DIAGNOSIS — R413 Other amnesia: Secondary | ICD-10-CM | POA: Diagnosis not present

## 2018-04-22 DIAGNOSIS — Z87891 Personal history of nicotine dependence: Secondary | ICD-10-CM | POA: Diagnosis not present

## 2018-04-23 DIAGNOSIS — I69319 Unspecified symptoms and signs involving cognitive functions following cerebral infarction: Secondary | ICD-10-CM | POA: Diagnosis not present

## 2018-04-23 DIAGNOSIS — E119 Type 2 diabetes mellitus without complications: Secondary | ICD-10-CM | POA: Diagnosis not present

## 2018-04-23 DIAGNOSIS — Z7901 Long term (current) use of anticoagulants: Secondary | ICD-10-CM | POA: Diagnosis not present

## 2018-04-23 DIAGNOSIS — R569 Unspecified convulsions: Secondary | ICD-10-CM | POA: Diagnosis not present

## 2018-04-23 DIAGNOSIS — Z7409 Other reduced mobility: Secondary | ICD-10-CM | POA: Diagnosis not present

## 2018-04-23 DIAGNOSIS — Z4800 Encounter for change or removal of nonsurgical wound dressing: Secondary | ICD-10-CM | POA: Diagnosis not present

## 2018-04-23 DIAGNOSIS — S41102D Unspecified open wound of left upper arm, subsequent encounter: Secondary | ICD-10-CM | POA: Diagnosis not present

## 2018-04-23 DIAGNOSIS — D72819 Decreased white blood cell count, unspecified: Secondary | ICD-10-CM | POA: Diagnosis not present

## 2018-04-23 DIAGNOSIS — K529 Noninfective gastroenteritis and colitis, unspecified: Secondary | ICD-10-CM | POA: Diagnosis not present

## 2018-04-23 DIAGNOSIS — I82403 Acute embolism and thrombosis of unspecified deep veins of lower extremity, bilateral: Secondary | ICD-10-CM | POA: Diagnosis not present

## 2018-04-23 DIAGNOSIS — I69391 Dysphagia following cerebral infarction: Secondary | ICD-10-CM | POA: Diagnosis not present

## 2018-04-23 DIAGNOSIS — R413 Other amnesia: Secondary | ICD-10-CM | POA: Diagnosis not present

## 2018-04-23 DIAGNOSIS — I69398 Other sequelae of cerebral infarction: Secondary | ICD-10-CM | POA: Diagnosis not present

## 2018-04-23 DIAGNOSIS — Z87891 Personal history of nicotine dependence: Secondary | ICD-10-CM | POA: Diagnosis not present

## 2018-04-23 DIAGNOSIS — I10 Essential (primary) hypertension: Secondary | ICD-10-CM | POA: Diagnosis not present

## 2018-04-23 DIAGNOSIS — R1310 Dysphagia, unspecified: Secondary | ICD-10-CM | POA: Diagnosis not present

## 2018-04-23 DIAGNOSIS — D62 Acute posthemorrhagic anemia: Secondary | ICD-10-CM | POA: Diagnosis not present

## 2018-04-23 DIAGNOSIS — E46 Unspecified protein-calorie malnutrition: Secondary | ICD-10-CM | POA: Diagnosis not present

## 2018-04-23 DIAGNOSIS — D72820 Lymphocytosis (symptomatic): Secondary | ICD-10-CM | POA: Diagnosis not present

## 2018-04-23 DIAGNOSIS — L89153 Pressure ulcer of sacral region, stage 3: Secondary | ICD-10-CM | POA: Diagnosis not present

## 2018-04-26 DIAGNOSIS — D72820 Lymphocytosis (symptomatic): Secondary | ICD-10-CM | POA: Diagnosis not present

## 2018-04-26 DIAGNOSIS — D62 Acute posthemorrhagic anemia: Secondary | ICD-10-CM | POA: Diagnosis not present

## 2018-04-26 DIAGNOSIS — Z7409 Other reduced mobility: Secondary | ICD-10-CM | POA: Diagnosis not present

## 2018-04-26 DIAGNOSIS — I82403 Acute embolism and thrombosis of unspecified deep veins of lower extremity, bilateral: Secondary | ICD-10-CM | POA: Diagnosis not present

## 2018-04-26 DIAGNOSIS — D72819 Decreased white blood cell count, unspecified: Secondary | ICD-10-CM | POA: Diagnosis not present

## 2018-04-26 DIAGNOSIS — R1310 Dysphagia, unspecified: Secondary | ICD-10-CM | POA: Diagnosis not present

## 2018-04-26 DIAGNOSIS — I69398 Other sequelae of cerebral infarction: Secondary | ICD-10-CM | POA: Diagnosis not present

## 2018-04-26 DIAGNOSIS — Z7901 Long term (current) use of anticoagulants: Secondary | ICD-10-CM | POA: Diagnosis not present

## 2018-04-26 DIAGNOSIS — S41102D Unspecified open wound of left upper arm, subsequent encounter: Secondary | ICD-10-CM | POA: Diagnosis not present

## 2018-04-26 DIAGNOSIS — E119 Type 2 diabetes mellitus without complications: Secondary | ICD-10-CM | POA: Diagnosis not present

## 2018-04-26 DIAGNOSIS — I69391 Dysphagia following cerebral infarction: Secondary | ICD-10-CM | POA: Diagnosis not present

## 2018-04-26 DIAGNOSIS — K529 Noninfective gastroenteritis and colitis, unspecified: Secondary | ICD-10-CM | POA: Diagnosis not present

## 2018-04-26 DIAGNOSIS — Z87891 Personal history of nicotine dependence: Secondary | ICD-10-CM | POA: Diagnosis not present

## 2018-04-26 DIAGNOSIS — I69319 Unspecified symptoms and signs involving cognitive functions following cerebral infarction: Secondary | ICD-10-CM | POA: Diagnosis not present

## 2018-04-26 DIAGNOSIS — Z4800 Encounter for change or removal of nonsurgical wound dressing: Secondary | ICD-10-CM | POA: Diagnosis not present

## 2018-04-26 DIAGNOSIS — L89153 Pressure ulcer of sacral region, stage 3: Secondary | ICD-10-CM | POA: Diagnosis not present

## 2018-04-26 DIAGNOSIS — I10 Essential (primary) hypertension: Secondary | ICD-10-CM | POA: Diagnosis not present

## 2018-04-26 DIAGNOSIS — E46 Unspecified protein-calorie malnutrition: Secondary | ICD-10-CM | POA: Diagnosis not present

## 2018-04-26 DIAGNOSIS — R413 Other amnesia: Secondary | ICD-10-CM | POA: Diagnosis not present

## 2018-04-26 DIAGNOSIS — R569 Unspecified convulsions: Secondary | ICD-10-CM | POA: Diagnosis not present

## 2018-04-27 DIAGNOSIS — R569 Unspecified convulsions: Secondary | ICD-10-CM | POA: Diagnosis not present

## 2018-04-27 DIAGNOSIS — I69391 Dysphagia following cerebral infarction: Secondary | ICD-10-CM | POA: Diagnosis not present

## 2018-04-27 DIAGNOSIS — D72820 Lymphocytosis (symptomatic): Secondary | ICD-10-CM | POA: Diagnosis not present

## 2018-04-27 DIAGNOSIS — I69319 Unspecified symptoms and signs involving cognitive functions following cerebral infarction: Secondary | ICD-10-CM | POA: Diagnosis not present

## 2018-04-27 DIAGNOSIS — D72819 Decreased white blood cell count, unspecified: Secondary | ICD-10-CM | POA: Diagnosis not present

## 2018-04-27 DIAGNOSIS — E119 Type 2 diabetes mellitus without complications: Secondary | ICD-10-CM | POA: Diagnosis not present

## 2018-04-27 DIAGNOSIS — S41102D Unspecified open wound of left upper arm, subsequent encounter: Secondary | ICD-10-CM | POA: Diagnosis not present

## 2018-04-27 DIAGNOSIS — K529 Noninfective gastroenteritis and colitis, unspecified: Secondary | ICD-10-CM | POA: Diagnosis not present

## 2018-04-27 DIAGNOSIS — I69398 Other sequelae of cerebral infarction: Secondary | ICD-10-CM | POA: Diagnosis not present

## 2018-04-27 DIAGNOSIS — Z7901 Long term (current) use of anticoagulants: Secondary | ICD-10-CM | POA: Diagnosis not present

## 2018-04-27 DIAGNOSIS — L89153 Pressure ulcer of sacral region, stage 3: Secondary | ICD-10-CM | POA: Diagnosis not present

## 2018-04-27 DIAGNOSIS — D62 Acute posthemorrhagic anemia: Secondary | ICD-10-CM | POA: Diagnosis not present

## 2018-04-27 DIAGNOSIS — Z87891 Personal history of nicotine dependence: Secondary | ICD-10-CM | POA: Diagnosis not present

## 2018-04-27 DIAGNOSIS — I10 Essential (primary) hypertension: Secondary | ICD-10-CM | POA: Diagnosis not present

## 2018-04-27 DIAGNOSIS — Z7409 Other reduced mobility: Secondary | ICD-10-CM | POA: Diagnosis not present

## 2018-04-27 DIAGNOSIS — I82403 Acute embolism and thrombosis of unspecified deep veins of lower extremity, bilateral: Secondary | ICD-10-CM | POA: Diagnosis not present

## 2018-04-27 DIAGNOSIS — R1310 Dysphagia, unspecified: Secondary | ICD-10-CM | POA: Diagnosis not present

## 2018-04-27 DIAGNOSIS — R413 Other amnesia: Secondary | ICD-10-CM | POA: Diagnosis not present

## 2018-04-27 DIAGNOSIS — E46 Unspecified protein-calorie malnutrition: Secondary | ICD-10-CM | POA: Diagnosis not present

## 2018-04-27 DIAGNOSIS — Z4800 Encounter for change or removal of nonsurgical wound dressing: Secondary | ICD-10-CM | POA: Diagnosis not present

## 2018-04-29 DIAGNOSIS — I639 Cerebral infarction, unspecified: Secondary | ICD-10-CM | POA: Diagnosis not present

## 2018-04-30 ENCOUNTER — Telehealth: Payer: Self-pay

## 2018-04-30 DIAGNOSIS — D72820 Lymphocytosis (symptomatic): Secondary | ICD-10-CM | POA: Diagnosis not present

## 2018-04-30 DIAGNOSIS — I69391 Dysphagia following cerebral infarction: Secondary | ICD-10-CM | POA: Diagnosis not present

## 2018-04-30 DIAGNOSIS — Z7901 Long term (current) use of anticoagulants: Secondary | ICD-10-CM | POA: Diagnosis not present

## 2018-04-30 DIAGNOSIS — I82403 Acute embolism and thrombosis of unspecified deep veins of lower extremity, bilateral: Secondary | ICD-10-CM | POA: Diagnosis not present

## 2018-04-30 DIAGNOSIS — L89153 Pressure ulcer of sacral region, stage 3: Secondary | ICD-10-CM | POA: Diagnosis not present

## 2018-04-30 DIAGNOSIS — D72819 Decreased white blood cell count, unspecified: Secondary | ICD-10-CM | POA: Diagnosis not present

## 2018-04-30 DIAGNOSIS — I69398 Other sequelae of cerebral infarction: Secondary | ICD-10-CM | POA: Diagnosis not present

## 2018-04-30 DIAGNOSIS — K529 Noninfective gastroenteritis and colitis, unspecified: Secondary | ICD-10-CM | POA: Diagnosis not present

## 2018-04-30 DIAGNOSIS — I10 Essential (primary) hypertension: Secondary | ICD-10-CM | POA: Diagnosis not present

## 2018-04-30 DIAGNOSIS — R413 Other amnesia: Secondary | ICD-10-CM | POA: Diagnosis not present

## 2018-04-30 DIAGNOSIS — I69319 Unspecified symptoms and signs involving cognitive functions following cerebral infarction: Secondary | ICD-10-CM | POA: Diagnosis not present

## 2018-04-30 DIAGNOSIS — Z87891 Personal history of nicotine dependence: Secondary | ICD-10-CM | POA: Diagnosis not present

## 2018-04-30 DIAGNOSIS — Z7409 Other reduced mobility: Secondary | ICD-10-CM | POA: Diagnosis not present

## 2018-04-30 DIAGNOSIS — R569 Unspecified convulsions: Secondary | ICD-10-CM | POA: Diagnosis not present

## 2018-04-30 DIAGNOSIS — Z4800 Encounter for change or removal of nonsurgical wound dressing: Secondary | ICD-10-CM | POA: Diagnosis not present

## 2018-04-30 DIAGNOSIS — R2689 Other abnormalities of gait and mobility: Secondary | ICD-10-CM | POA: Diagnosis not present

## 2018-04-30 DIAGNOSIS — R1310 Dysphagia, unspecified: Secondary | ICD-10-CM | POA: Diagnosis not present

## 2018-04-30 DIAGNOSIS — E46 Unspecified protein-calorie malnutrition: Secondary | ICD-10-CM | POA: Diagnosis not present

## 2018-04-30 DIAGNOSIS — S41102D Unspecified open wound of left upper arm, subsequent encounter: Secondary | ICD-10-CM | POA: Diagnosis not present

## 2018-04-30 DIAGNOSIS — D62 Acute posthemorrhagic anemia: Secondary | ICD-10-CM | POA: Diagnosis not present

## 2018-04-30 DIAGNOSIS — E119 Type 2 diabetes mellitus without complications: Secondary | ICD-10-CM | POA: Diagnosis not present

## 2018-04-30 NOTE — Telephone Encounter (Signed)
Bethany, PTA from [facility] called stating pt husband has cancelled two HHPT appts. They are working on rescheduling

## 2018-05-03 ENCOUNTER — Telehealth: Payer: Self-pay

## 2018-05-03 DIAGNOSIS — Z4800 Encounter for change or removal of nonsurgical wound dressing: Secondary | ICD-10-CM | POA: Diagnosis not present

## 2018-05-03 DIAGNOSIS — I82403 Acute embolism and thrombosis of unspecified deep veins of lower extremity, bilateral: Secondary | ICD-10-CM | POA: Diagnosis not present

## 2018-05-03 DIAGNOSIS — I10 Essential (primary) hypertension: Secondary | ICD-10-CM | POA: Diagnosis not present

## 2018-05-03 DIAGNOSIS — I69319 Unspecified symptoms and signs involving cognitive functions following cerebral infarction: Secondary | ICD-10-CM | POA: Diagnosis not present

## 2018-05-03 DIAGNOSIS — S41102D Unspecified open wound of left upper arm, subsequent encounter: Secondary | ICD-10-CM | POA: Diagnosis not present

## 2018-05-03 DIAGNOSIS — E119 Type 2 diabetes mellitus without complications: Secondary | ICD-10-CM | POA: Diagnosis not present

## 2018-05-03 DIAGNOSIS — D72819 Decreased white blood cell count, unspecified: Secondary | ICD-10-CM | POA: Diagnosis not present

## 2018-05-03 DIAGNOSIS — I69391 Dysphagia following cerebral infarction: Secondary | ICD-10-CM | POA: Diagnosis not present

## 2018-05-03 DIAGNOSIS — Z7409 Other reduced mobility: Secondary | ICD-10-CM | POA: Diagnosis not present

## 2018-05-03 DIAGNOSIS — L89153 Pressure ulcer of sacral region, stage 3: Secondary | ICD-10-CM | POA: Diagnosis not present

## 2018-05-03 DIAGNOSIS — R569 Unspecified convulsions: Secondary | ICD-10-CM | POA: Diagnosis not present

## 2018-05-03 DIAGNOSIS — R1310 Dysphagia, unspecified: Secondary | ICD-10-CM | POA: Diagnosis not present

## 2018-05-03 DIAGNOSIS — D72820 Lymphocytosis (symptomatic): Secondary | ICD-10-CM | POA: Diagnosis not present

## 2018-05-03 DIAGNOSIS — D62 Acute posthemorrhagic anemia: Secondary | ICD-10-CM | POA: Diagnosis not present

## 2018-05-03 DIAGNOSIS — R413 Other amnesia: Secondary | ICD-10-CM | POA: Diagnosis not present

## 2018-05-03 DIAGNOSIS — I69398 Other sequelae of cerebral infarction: Secondary | ICD-10-CM | POA: Diagnosis not present

## 2018-05-03 DIAGNOSIS — Z7901 Long term (current) use of anticoagulants: Secondary | ICD-10-CM | POA: Diagnosis not present

## 2018-05-03 DIAGNOSIS — Z87891 Personal history of nicotine dependence: Secondary | ICD-10-CM | POA: Diagnosis not present

## 2018-05-03 DIAGNOSIS — E46 Unspecified protein-calorie malnutrition: Secondary | ICD-10-CM | POA: Diagnosis not present

## 2018-05-03 DIAGNOSIS — K529 Noninfective gastroenteritis and colitis, unspecified: Secondary | ICD-10-CM | POA: Diagnosis not present

## 2018-05-03 NOTE — Telephone Encounter (Signed)
Merry Proud PT and [place] both from [facility], called requesting verbal orders for this patient.  Ebony Hail requested 1xwk X 2wks for cognition training Merry Proud PT requested 1 additional visit due to a cancelled weekend appointment.  Both sets of verbal orders approved and jeff and allison notified.

## 2018-05-04 DIAGNOSIS — S41102D Unspecified open wound of left upper arm, subsequent encounter: Secondary | ICD-10-CM | POA: Diagnosis not present

## 2018-05-04 DIAGNOSIS — D62 Acute posthemorrhagic anemia: Secondary | ICD-10-CM | POA: Diagnosis not present

## 2018-05-04 DIAGNOSIS — I10 Essential (primary) hypertension: Secondary | ICD-10-CM | POA: Diagnosis not present

## 2018-05-04 DIAGNOSIS — D72819 Decreased white blood cell count, unspecified: Secondary | ICD-10-CM | POA: Diagnosis not present

## 2018-05-04 DIAGNOSIS — Z7901 Long term (current) use of anticoagulants: Secondary | ICD-10-CM | POA: Diagnosis not present

## 2018-05-04 DIAGNOSIS — I69398 Other sequelae of cerebral infarction: Secondary | ICD-10-CM | POA: Diagnosis not present

## 2018-05-04 DIAGNOSIS — L89153 Pressure ulcer of sacral region, stage 3: Secondary | ICD-10-CM | POA: Diagnosis not present

## 2018-05-04 DIAGNOSIS — D72820 Lymphocytosis (symptomatic): Secondary | ICD-10-CM | POA: Diagnosis not present

## 2018-05-04 DIAGNOSIS — R413 Other amnesia: Secondary | ICD-10-CM | POA: Diagnosis not present

## 2018-05-04 DIAGNOSIS — R569 Unspecified convulsions: Secondary | ICD-10-CM | POA: Diagnosis not present

## 2018-05-04 DIAGNOSIS — R1310 Dysphagia, unspecified: Secondary | ICD-10-CM | POA: Diagnosis not present

## 2018-05-04 DIAGNOSIS — Z87891 Personal history of nicotine dependence: Secondary | ICD-10-CM | POA: Diagnosis not present

## 2018-05-04 DIAGNOSIS — Z4800 Encounter for change or removal of nonsurgical wound dressing: Secondary | ICD-10-CM | POA: Diagnosis not present

## 2018-05-04 DIAGNOSIS — Z7409 Other reduced mobility: Secondary | ICD-10-CM | POA: Diagnosis not present

## 2018-05-04 DIAGNOSIS — E46 Unspecified protein-calorie malnutrition: Secondary | ICD-10-CM | POA: Diagnosis not present

## 2018-05-04 DIAGNOSIS — K529 Noninfective gastroenteritis and colitis, unspecified: Secondary | ICD-10-CM | POA: Diagnosis not present

## 2018-05-04 DIAGNOSIS — I69391 Dysphagia following cerebral infarction: Secondary | ICD-10-CM | POA: Diagnosis not present

## 2018-05-04 DIAGNOSIS — I69319 Unspecified symptoms and signs involving cognitive functions following cerebral infarction: Secondary | ICD-10-CM | POA: Diagnosis not present

## 2018-05-04 DIAGNOSIS — I82403 Acute embolism and thrombosis of unspecified deep veins of lower extremity, bilateral: Secondary | ICD-10-CM | POA: Diagnosis not present

## 2018-05-04 DIAGNOSIS — E119 Type 2 diabetes mellitus without complications: Secondary | ICD-10-CM | POA: Diagnosis not present

## 2018-05-06 ENCOUNTER — Telehealth: Payer: Self-pay

## 2018-05-06 DIAGNOSIS — R1310 Dysphagia, unspecified: Secondary | ICD-10-CM | POA: Diagnosis not present

## 2018-05-06 DIAGNOSIS — Z4800 Encounter for change or removal of nonsurgical wound dressing: Secondary | ICD-10-CM | POA: Diagnosis not present

## 2018-05-06 DIAGNOSIS — I10 Essential (primary) hypertension: Secondary | ICD-10-CM | POA: Diagnosis not present

## 2018-05-06 DIAGNOSIS — R413 Other amnesia: Secondary | ICD-10-CM | POA: Diagnosis not present

## 2018-05-06 DIAGNOSIS — D72819 Decreased white blood cell count, unspecified: Secondary | ICD-10-CM | POA: Diagnosis not present

## 2018-05-06 DIAGNOSIS — R569 Unspecified convulsions: Secondary | ICD-10-CM | POA: Diagnosis not present

## 2018-05-06 DIAGNOSIS — L89153 Pressure ulcer of sacral region, stage 3: Secondary | ICD-10-CM | POA: Diagnosis not present

## 2018-05-06 DIAGNOSIS — Z7409 Other reduced mobility: Secondary | ICD-10-CM | POA: Diagnosis not present

## 2018-05-06 DIAGNOSIS — I82403 Acute embolism and thrombosis of unspecified deep veins of lower extremity, bilateral: Secondary | ICD-10-CM | POA: Diagnosis not present

## 2018-05-06 DIAGNOSIS — I69391 Dysphagia following cerebral infarction: Secondary | ICD-10-CM | POA: Diagnosis not present

## 2018-05-06 DIAGNOSIS — Z7901 Long term (current) use of anticoagulants: Secondary | ICD-10-CM | POA: Diagnosis not present

## 2018-05-06 DIAGNOSIS — D72820 Lymphocytosis (symptomatic): Secondary | ICD-10-CM | POA: Diagnosis not present

## 2018-05-06 DIAGNOSIS — D62 Acute posthemorrhagic anemia: Secondary | ICD-10-CM | POA: Diagnosis not present

## 2018-05-06 DIAGNOSIS — S41102D Unspecified open wound of left upper arm, subsequent encounter: Secondary | ICD-10-CM | POA: Diagnosis not present

## 2018-05-06 DIAGNOSIS — E119 Type 2 diabetes mellitus without complications: Secondary | ICD-10-CM | POA: Diagnosis not present

## 2018-05-06 DIAGNOSIS — I69319 Unspecified symptoms and signs involving cognitive functions following cerebral infarction: Secondary | ICD-10-CM | POA: Diagnosis not present

## 2018-05-06 DIAGNOSIS — Z87891 Personal history of nicotine dependence: Secondary | ICD-10-CM | POA: Diagnosis not present

## 2018-05-06 DIAGNOSIS — I69398 Other sequelae of cerebral infarction: Secondary | ICD-10-CM | POA: Diagnosis not present

## 2018-05-06 DIAGNOSIS — E46 Unspecified protein-calorie malnutrition: Secondary | ICD-10-CM | POA: Diagnosis not present

## 2018-05-06 DIAGNOSIS — K529 Noninfective gastroenteritis and colitis, unspecified: Secondary | ICD-10-CM | POA: Diagnosis not present

## 2018-05-06 NOTE — Telephone Encounter (Signed)
Henderson Newcomer OT [facility] called requesting verbal orders for 2xwk X 1wk followed by 1xwk X 3wks.  Called her back and approved verbal orders  In addition, was informed by susan that this patient has cut back on prescription of imodium 2mg  from 2 caps TID  to 1 or 2 caps daily due to constipation issues.

## 2018-05-07 ENCOUNTER — Encounter (HOSPITAL_BASED_OUTPATIENT_CLINIC_OR_DEPARTMENT_OTHER): Payer: Medicare Other | Attending: Internal Medicine

## 2018-05-07 DIAGNOSIS — Z86718 Personal history of other venous thrombosis and embolism: Secondary | ICD-10-CM | POA: Insufficient documentation

## 2018-05-07 DIAGNOSIS — E43 Unspecified severe protein-calorie malnutrition: Secondary | ICD-10-CM | POA: Diagnosis not present

## 2018-05-07 DIAGNOSIS — I10 Essential (primary) hypertension: Secondary | ICD-10-CM | POA: Diagnosis not present

## 2018-05-07 DIAGNOSIS — L89154 Pressure ulcer of sacral region, stage 4: Secondary | ICD-10-CM | POA: Diagnosis not present

## 2018-05-07 DIAGNOSIS — E119 Type 2 diabetes mellitus without complications: Secondary | ICD-10-CM | POA: Diagnosis not present

## 2018-05-10 DIAGNOSIS — D62 Acute posthemorrhagic anemia: Secondary | ICD-10-CM | POA: Diagnosis not present

## 2018-05-10 DIAGNOSIS — E46 Unspecified protein-calorie malnutrition: Secondary | ICD-10-CM | POA: Diagnosis not present

## 2018-05-10 DIAGNOSIS — S41102D Unspecified open wound of left upper arm, subsequent encounter: Secondary | ICD-10-CM | POA: Diagnosis not present

## 2018-05-10 DIAGNOSIS — I82403 Acute embolism and thrombosis of unspecified deep veins of lower extremity, bilateral: Secondary | ICD-10-CM | POA: Diagnosis not present

## 2018-05-10 DIAGNOSIS — I69391 Dysphagia following cerebral infarction: Secondary | ICD-10-CM | POA: Diagnosis not present

## 2018-05-10 DIAGNOSIS — I10 Essential (primary) hypertension: Secondary | ICD-10-CM | POA: Diagnosis not present

## 2018-05-10 DIAGNOSIS — Z87891 Personal history of nicotine dependence: Secondary | ICD-10-CM | POA: Diagnosis not present

## 2018-05-10 DIAGNOSIS — I69319 Unspecified symptoms and signs involving cognitive functions following cerebral infarction: Secondary | ICD-10-CM | POA: Diagnosis not present

## 2018-05-10 DIAGNOSIS — R1310 Dysphagia, unspecified: Secondary | ICD-10-CM | POA: Diagnosis not present

## 2018-05-10 DIAGNOSIS — Z4800 Encounter for change or removal of nonsurgical wound dressing: Secondary | ICD-10-CM | POA: Diagnosis not present

## 2018-05-10 DIAGNOSIS — R569 Unspecified convulsions: Secondary | ICD-10-CM | POA: Diagnosis not present

## 2018-05-10 DIAGNOSIS — I69398 Other sequelae of cerebral infarction: Secondary | ICD-10-CM | POA: Diagnosis not present

## 2018-05-10 DIAGNOSIS — R413 Other amnesia: Secondary | ICD-10-CM | POA: Diagnosis not present

## 2018-05-10 DIAGNOSIS — I639 Cerebral infarction, unspecified: Secondary | ICD-10-CM | POA: Diagnosis not present

## 2018-05-10 DIAGNOSIS — D72820 Lymphocytosis (symptomatic): Secondary | ICD-10-CM | POA: Diagnosis not present

## 2018-05-10 DIAGNOSIS — Z7901 Long term (current) use of anticoagulants: Secondary | ICD-10-CM | POA: Diagnosis not present

## 2018-05-10 DIAGNOSIS — Z7409 Other reduced mobility: Secondary | ICD-10-CM | POA: Diagnosis not present

## 2018-05-10 DIAGNOSIS — D72819 Decreased white blood cell count, unspecified: Secondary | ICD-10-CM | POA: Diagnosis not present

## 2018-05-10 DIAGNOSIS — K529 Noninfective gastroenteritis and colitis, unspecified: Secondary | ICD-10-CM | POA: Diagnosis not present

## 2018-05-10 DIAGNOSIS — L89153 Pressure ulcer of sacral region, stage 3: Secondary | ICD-10-CM | POA: Diagnosis not present

## 2018-05-10 DIAGNOSIS — E119 Type 2 diabetes mellitus without complications: Secondary | ICD-10-CM | POA: Diagnosis not present

## 2018-05-11 DIAGNOSIS — E46 Unspecified protein-calorie malnutrition: Secondary | ICD-10-CM | POA: Diagnosis not present

## 2018-05-11 DIAGNOSIS — Z7409 Other reduced mobility: Secondary | ICD-10-CM | POA: Diagnosis not present

## 2018-05-11 DIAGNOSIS — Z7901 Long term (current) use of anticoagulants: Secondary | ICD-10-CM | POA: Diagnosis not present

## 2018-05-11 DIAGNOSIS — S41102D Unspecified open wound of left upper arm, subsequent encounter: Secondary | ICD-10-CM | POA: Diagnosis not present

## 2018-05-11 DIAGNOSIS — Z4800 Encounter for change or removal of nonsurgical wound dressing: Secondary | ICD-10-CM | POA: Diagnosis not present

## 2018-05-11 DIAGNOSIS — Z87891 Personal history of nicotine dependence: Secondary | ICD-10-CM | POA: Diagnosis not present

## 2018-05-11 DIAGNOSIS — I69319 Unspecified symptoms and signs involving cognitive functions following cerebral infarction: Secondary | ICD-10-CM | POA: Diagnosis not present

## 2018-05-11 DIAGNOSIS — R1310 Dysphagia, unspecified: Secondary | ICD-10-CM | POA: Diagnosis not present

## 2018-05-11 DIAGNOSIS — D72819 Decreased white blood cell count, unspecified: Secondary | ICD-10-CM | POA: Diagnosis not present

## 2018-05-11 DIAGNOSIS — I69391 Dysphagia following cerebral infarction: Secondary | ICD-10-CM | POA: Diagnosis not present

## 2018-05-11 DIAGNOSIS — I82403 Acute embolism and thrombosis of unspecified deep veins of lower extremity, bilateral: Secondary | ICD-10-CM | POA: Diagnosis not present

## 2018-05-11 DIAGNOSIS — R413 Other amnesia: Secondary | ICD-10-CM | POA: Diagnosis not present

## 2018-05-11 DIAGNOSIS — K529 Noninfective gastroenteritis and colitis, unspecified: Secondary | ICD-10-CM | POA: Diagnosis not present

## 2018-05-11 DIAGNOSIS — D62 Acute posthemorrhagic anemia: Secondary | ICD-10-CM | POA: Diagnosis not present

## 2018-05-11 DIAGNOSIS — R569 Unspecified convulsions: Secondary | ICD-10-CM | POA: Diagnosis not present

## 2018-05-11 DIAGNOSIS — E119 Type 2 diabetes mellitus without complications: Secondary | ICD-10-CM | POA: Diagnosis not present

## 2018-05-11 DIAGNOSIS — I69398 Other sequelae of cerebral infarction: Secondary | ICD-10-CM | POA: Diagnosis not present

## 2018-05-11 DIAGNOSIS — L89153 Pressure ulcer of sacral region, stage 3: Secondary | ICD-10-CM | POA: Diagnosis not present

## 2018-05-11 DIAGNOSIS — D72820 Lymphocytosis (symptomatic): Secondary | ICD-10-CM | POA: Diagnosis not present

## 2018-05-11 DIAGNOSIS — I10 Essential (primary) hypertension: Secondary | ICD-10-CM | POA: Diagnosis not present

## 2018-05-13 ENCOUNTER — Encounter: Payer: Self-pay | Admitting: Registered Nurse

## 2018-05-13 ENCOUNTER — Telehealth: Payer: Self-pay | Admitting: *Deleted

## 2018-05-13 ENCOUNTER — Inpatient Hospital Stay: Payer: Self-pay | Admitting: Physical Medicine & Rehabilitation

## 2018-05-13 ENCOUNTER — Encounter: Payer: Medicare Other | Attending: Physical Medicine & Rehabilitation | Admitting: Registered Nurse

## 2018-05-13 ENCOUNTER — Inpatient Hospital Stay (HOSPITAL_COMMUNITY): Payer: Self-pay | Admitting: Physical Therapy

## 2018-05-13 ENCOUNTER — Other Ambulatory Visit: Payer: Self-pay

## 2018-05-13 VITALS — BP 134/89 | HR 103 | Ht 62.0 in | Wt 103.0 lb

## 2018-05-13 DIAGNOSIS — I10 Essential (primary) hypertension: Secondary | ICD-10-CM | POA: Insufficient documentation

## 2018-05-13 DIAGNOSIS — L89154 Pressure ulcer of sacral region, stage 4: Secondary | ICD-10-CM

## 2018-05-13 DIAGNOSIS — D62 Acute posthemorrhagic anemia: Secondary | ICD-10-CM | POA: Diagnosis not present

## 2018-05-13 DIAGNOSIS — I63412 Cerebral infarction due to embolism of left middle cerebral artery: Secondary | ICD-10-CM | POA: Insufficient documentation

## 2018-05-13 DIAGNOSIS — I824Z3 Acute embolism and thrombosis of unspecified deep veins of distal lower extremity, bilateral: Secondary | ICD-10-CM | POA: Insufficient documentation

## 2018-05-13 DIAGNOSIS — Z298 Encounter for other specified prophylactic measures: Secondary | ICD-10-CM | POA: Diagnosis not present

## 2018-05-13 DIAGNOSIS — E43 Unspecified severe protein-calorie malnutrition: Secondary | ICD-10-CM | POA: Diagnosis not present

## 2018-05-13 DIAGNOSIS — E876 Hypokalemia: Secondary | ICD-10-CM | POA: Insufficient documentation

## 2018-05-13 MED ORDER — CALCIUM POLYCARBOPHIL 625 MG PO TABS: 1250.0000 mg | ORAL_TABLET | Freq: Two times a day (BID) | ORAL | 0 refills | Status: DC

## 2018-05-13 MED ORDER — TAMSULOSIN HCL 0.4 MG PO CAPS: 0.4000 mg | ORAL_CAPSULE | Freq: Every day | ORAL | 0 refills | Status: DC

## 2018-05-13 MED ORDER — APIXABAN 5 MG PO TABS: 5.0000 mg | ORAL_TABLET | Freq: Two times a day (BID) | ORAL | 0 refills | Status: DC

## 2018-05-13 MED ORDER — FAMOTIDINE 10 MG PO TABS: 10.0000 mg | ORAL_TABLET | Freq: Two times a day (BID) | ORAL | 0 refills | Status: DC

## 2018-05-13 MED ORDER — LACTINEX PO CHEW: 1.0000 | CHEWABLE_TABLET | Freq: Three times a day (TID) | ORAL | 1 refills | Status: DC

## 2018-05-13 MED ORDER — LEVETIRACETAM 500 MG PO TABS: 500.0000 mg | ORAL_TABLET | Freq: Two times a day (BID) | ORAL | 0 refills | Status: DC

## 2018-05-13 MED ORDER — ZINC SULFATE 220 (50 ZN) MG PO CAPS: 220.0000 mg | ORAL_CAPSULE | Freq: Every day | ORAL | 0 refills | Status: DC

## 2018-05-13 MED ORDER — BETHANECHOL CHLORIDE 10 MG PO TABS: 10.0000 mg | ORAL_TABLET | Freq: Three times a day (TID) | ORAL | 0 refills | Status: DC

## 2018-05-13 NOTE — Telephone Encounter (Signed)
Cecille Rubin from [facility] returned call from [place].

## 2018-05-13 NOTE — Progress Notes (Signed)
Subjective:    Patient ID: Maria Avila, female    DOB: 11-26-1950, 68 y.o.   MRN: [id number]  HPI: Maria Avila is a 67 y.o. female who is here for Hospital Follow up appointment and Face to Face wheelchair evaluation. Maria Avila was readmitted to [facility] on 03/04/2018 with mental status changes, hypoxia and tachycardia due to sepsis.  A CT Chest was Performed:  IMPRESSION: Large complex left pleural effusion with pleural gas and extensive loculations. This could reflect bronchopleural fistula or empyema. Collapse of much of the left lung with only a small amount of aerated left upper lobe. Nodular ground-glass airspace opacities centrally in the right upper lobe and in the superior segment of the right lower lobe, likely infectious/inflammatory. Coronary artery disease, aortic atherosclerosis.  On 03/09/2018 she was found to be lethargic with right sided weakness, left gaze and mumbling speech. CT of The Head:  IMPRESSION: Acute/subacute infarction in the left parietal lobe consistent with MCA branch vessel infarction. No evidence of hemorrhage or mass Effect CT Cerebral Perfusion with contrast:  .IMPRESSION: 1. Patent carotid and vertebral arteries. No dissection, aneurysm, or hemodynamically significant stenosis utilizing NASCET criteria. 2. Patent anterior and posterior intracranial circulation. No large vessel occlusion, aneurysm, or significant stenosis. 3. Left parietal lobe subacute infarction visible on noncontrast CT with pseudonormalized perfusion. 4. No perfusion anomaly to suggest interval acute infarct.  Per Discharge summary Dr. Rigoberto Noel felt the stroke was embolic due to unknown source/ endocarditis versus hypercoagulable state due to sepsis.   Also bilateral lower extremities dopplers were positive for acute DVT in right posterior tibial vein and left peroneal veins. She was started on IV heparin aand transition to Xarelto.   [name] was  admitted to inpatient rehabilitation on 03/17/2018 and discharge on 04/09/2018. She is here today for hospital follow up of her  Cerebral Infarction due to embolism of left middle cerebral artery, hypertension, sacral decubitus stage IV, seizure prophylaxis/ probable seizure, acute blood loss anemia, acute DVT of distal vein of both lower extremities, hypokalemia and sever protein- calorie malnutrition. She has been home with home health therapies with [place]. She denies pain, but reports left arm numbness at time. At this time no reports of numbness. She rated her pain 0.  She reports she has a fair appetite, she is compliant with her feeding supplement, she states. She is walking with a walker with the physical therapist. She arrived to office in a wheelchair.    Maria Avila is a 68 y.o. female who is here for Face to Face for a Tilt N- Space Manual wheelchair. I have reviewed the the physical therapist wheelchair evaluation form and concur with their assessment and evaluation. She is unable to propel manual wheelchair due to cognitive deficits, apraxia, and decreased attention. She has trialed the tilt N space wheelchair while in inpatient rehabilitation, husband has demonstrated comfort with equipment and utilizing tilt feature for pressure relief due to sacral decubitus ulcer stage 4, and decreased attention initiation and mobility on behalf of the patient to perform independently.  The Tlt n space wheelchair will help with her mobility in the home, also improving her independence with activities of daily living such as bathing, dressing and toileting.     Pain Inventory Average Pain 0 Pain Right Now 0 My pain is no pain  In the last 24 hours, has pain interfered with the following? General activity 0 Relation with others 0 Enjoyment of life  0 What TIME of day is your pain at its worst? no pain Sleep (in general) NA  Pain is worse with: no pain Pain improves with: no  pain Relief from Meds: no pain  Mobility walk with assistance ability to climb steps?  no do you drive?  no use a wheelchair needs help with transfers  Function disabled: date disabled . retired I need assistance with the following:  dressing, bathing, toileting, meal prep, household duties and shopping  Neuro/Psych weakness trouble walking  Prior Studies Any changes since last visit?  no  Physicians involved in your care Any changes since last visit?  no Primary care Dr Shelia Media   Family History  Problem Relation Age of Onset  . Alzheimer's disease Mother   . Colon cancer Father    Social History   Socioeconomic History  . Marital status: Married    Spouse name: Not on file  . Number of children: Not on file  . Years of education: Not on file  . Highest education level: Not on file  Occupational History  . Not on file  Social Needs  . Financial resource strain: Not on file  . Food insecurity:    Worry: Not on file    Inability: Not on file  . Transportation needs:    Medical: Not on file    Non-medical: Not on file  Tobacco Use  . Smoking status: Former Research scientist (life sciences)  . Smokeless tobacco: Never Used  Substance and Sexual Activity  . Alcohol use: Not Currently  . Drug use: Not Currently  . Sexual activity: Not on file  Lifestyle  . Physical activity:    Days per week: Not on file    Minutes per session: Not on file  . Stress: Not on file  Relationships  . Social connections:    Talks on phone: Not on file    Gets together: Not on file    Attends religious service: Not on file    Active member of club or organization: Not on file    Attends meetings of clubs or organizations: Not on file    Relationship status: Not on file  Other Topics Concern  . Not on file  Social History Narrative  . Not on file   Past Surgical History:  Procedure Laterality Date  . BIOPSY  02/26/2018   Procedure: BIOPSY;  Surgeon: Wilford Corner, MD;  Location: [place];   Service: Endoscopy;;  . COLONOSCOPY WITH PROPOFOL N/A 02/26/2018   Procedure: COLONOSCOPY WITH PROPOFOL;  Surgeon: Wilford Corner, MD;  Location: [place];  Service: Endoscopy;  Laterality: N/A;  . ESOPHAGOGASTRODUODENOSCOPY (EGD) WITH PROPOFOL N/A 02/26/2018   Procedure: ESOPHAGOGASTRODUODENOSCOPY (EGD) WITH PROPOFOL;  Surgeon: Wilford Corner, MD;  Location: [place];  Service: Endoscopy;  Laterality: N/A;  . NO PAST SURGERIES     UNCERTAIN OF NAME OF SURGERY   Past Medical History:  Diagnosis Date  . Colitis   . Diabetes mellitus without complication ([place])   . Hypertension   . Malnutrition ([place])    BP 134/89   Pulse (!) 103   Ht 5\' 2"  (1.575 m) Comment: reported  Wt 103 lb (46.7 kg)   SpO2 98%   BMI 18.84 kg/m   Opioid Risk Score:   Fall Risk Score:  `1  Depression screen PHQ 2/9  Depression screen PHQ 2/9 05/13/2018  Decreased Interest 0  Down, Depressed, Hopeless 0  PHQ - 2 Score 0  Altered sleeping 0  Tired, decreased energy 0  Change  in appetite 0  Feeling bad or failure about yourself  0  Trouble concentrating 0  Moving slowly or fidgety/restless 0  Suicidal thoughts 0  PHQ-9 Score 0   Review of Systems  Constitutional: Negative.   HENT: Negative.   Eyes: Negative.   Respiratory: Negative.   Cardiovascular: Negative.   Gastrointestinal: Negative.   Endocrine: Negative.   Genitourinary: Negative.   Musculoskeletal: Positive for gait problem.  Skin: Positive for wound.  Allergic/Immunologic: Negative.   Neurological: Positive for weakness.  Hematological: Bruises/bleeds easily.       Eliquis  Psychiatric/Behavioral: Negative.   All other systems reviewed and are negative.      Objective:   Physical Exam Vitals signs and nursing note reviewed.  Constitutional:      Appearance: Normal appearance.  Neck:     Musculoskeletal: Normal range of motion and neck supple.  Cardiovascular:     Rate and Rhythm: Normal rate and regular rhythm.       Pulses: Normal pulses.     Heart sounds: Normal heart sounds.  Pulmonary:     Effort: Pulmonary effort is normal.     Breath sounds: Normal breath sounds.  Musculoskeletal:     Comments: Normal Muscle Bulk and Muscle Testing Reveals:  Upper Extremities: Right: Decreased ROM 45 Degrees and Muscle Strength 4/5 and Left: Full ROM and Muscle Strength 5/5  Lower Extremities: Full ROM and Muscle Strength 5/5 Arrived in wheelchair    Skin:    General: Skin is warm and dry.  Neurological:     Mental Status: She is alert and oriented to person, place, and time.  Psychiatric:        Mood and Affect: Mood normal.        Behavior: Behavior normal.           Assessment & Plan:  1. Cerebral Infarction due to embolism of left middle cerebral artery: Continue Home Health Therapies with [facility]. She has a F/U appointment with Neurology.  2. Hypertension: Controlled. Continue to Monitor. PCP Following. 3.Acute DVT of bilateral Lower Extremities: Continue Xarelto. PCP Following.  4. Acute Blood Loss Anemia: RX: CBC Today. PCP Following.  5. Seizure Prophylaxis: Continue Keppra: Neurology Following.  6.Severe Protein calorie Malnutrition: Continue Feeding Supplement.  7. Hypokalemia: RX: BMP: PCP Following. 8. Sacral Decubitus Ulcer Stage 4: [facility] Following.   40 minutes of face to face patient care time was spent during this visit. All questions was encouraged and answered.   F/U in 4-6 weeks with Dr Letta Pate.

## 2018-05-14 ENCOUNTER — Telehealth: Payer: Self-pay | Admitting: Registered Nurse

## 2018-05-14 ENCOUNTER — Telehealth: Payer: Self-pay | Admitting: *Deleted

## 2018-05-14 LAB — BASIC METABOLIC PANEL
BUN/Creatinine Ratio: 22 (ref 12–28)
BUN: 13 mg/dL (ref 8–27)
CO2: 27 mmol/L (ref 20–29)
Calcium: 10.2 mg/dL (ref 8.7–10.3)
Chloride: 104 mmol/L (ref 96–106)
Creatinine, Ser: 0.58 mg/dL (ref 0.57–1.00)
GFR calc Af Amer: 110 mL/min/{1.73_m2} (ref >59)
GFR calc non Af Amer: 95 mL/min/{1.73_m2} (ref >59)
Glucose: 95 mg/dL (ref 65–99)
Potassium: 4.2 mmol/L (ref 3.5–5.2)
Sodium: 144 mmol/L (ref 134–144)

## 2018-05-14 LAB — CBC
Hematocrit: 36.4 % (ref 34.0–46.6)
Hemoglobin: 12.2 g/dL (ref 11.1–15.9)
MCH: 31 pg (ref 26.6–33.0)
MCHC: 33.5 g/dL (ref 31.5–35.7)
MCV: 93 fL (ref 79–97)
Platelets: 425 10*3/uL (ref 150–450)
RBC: 3.93 x10E6/uL (ref 3.77–5.28)
RDW: 13 % (ref 11.7–15.4)
WBC: 8.7 10*3/uL (ref 3.4–10.8)

## 2018-05-14 NOTE — Telephone Encounter (Signed)
Lori with [facility] called to let us know that they received orders from Dr ?Darrel Hoover? To resume wound vac From [facility] and they will be faxing orders to be signed here for POC with request permission to get orders from [facility] as well.

## 2018-05-14 NOTE — Telephone Encounter (Signed)
Returned [name] call, no blood work  was drawn on Maria Avila

## 2018-05-14 NOTE — Telephone Encounter (Signed)
ok 

## 2018-05-14 NOTE — Telephone Encounter (Signed)
Placed a call to Mrs. Martinique regarding lab results, line busy. Will try again

## 2018-05-17 DIAGNOSIS — I69319 Unspecified symptoms and signs involving cognitive functions following cerebral infarction: Secondary | ICD-10-CM | POA: Diagnosis not present

## 2018-05-17 DIAGNOSIS — K529 Noninfective gastroenteritis and colitis, unspecified: Secondary | ICD-10-CM | POA: Diagnosis not present

## 2018-05-17 DIAGNOSIS — Z87891 Personal history of nicotine dependence: Secondary | ICD-10-CM | POA: Diagnosis not present

## 2018-05-17 DIAGNOSIS — R569 Unspecified convulsions: Secondary | ICD-10-CM | POA: Diagnosis not present

## 2018-05-17 DIAGNOSIS — R1310 Dysphagia, unspecified: Secondary | ICD-10-CM | POA: Diagnosis not present

## 2018-05-17 DIAGNOSIS — S41102D Unspecified open wound of left upper arm, subsequent encounter: Secondary | ICD-10-CM | POA: Diagnosis not present

## 2018-05-17 DIAGNOSIS — I82403 Acute embolism and thrombosis of unspecified deep veins of lower extremity, bilateral: Secondary | ICD-10-CM | POA: Diagnosis not present

## 2018-05-17 DIAGNOSIS — R413 Other amnesia: Secondary | ICD-10-CM | POA: Diagnosis not present

## 2018-05-17 DIAGNOSIS — D72819 Decreased white blood cell count, unspecified: Secondary | ICD-10-CM | POA: Diagnosis not present

## 2018-05-17 DIAGNOSIS — E46 Unspecified protein-calorie malnutrition: Secondary | ICD-10-CM | POA: Diagnosis not present

## 2018-05-17 DIAGNOSIS — L89153 Pressure ulcer of sacral region, stage 3: Secondary | ICD-10-CM | POA: Diagnosis not present

## 2018-05-17 DIAGNOSIS — I69391 Dysphagia following cerebral infarction: Secondary | ICD-10-CM | POA: Diagnosis not present

## 2018-05-17 DIAGNOSIS — I10 Essential (primary) hypertension: Secondary | ICD-10-CM | POA: Diagnosis not present

## 2018-05-17 DIAGNOSIS — Z4800 Encounter for change or removal of nonsurgical wound dressing: Secondary | ICD-10-CM | POA: Diagnosis not present

## 2018-05-17 DIAGNOSIS — I69398 Other sequelae of cerebral infarction: Secondary | ICD-10-CM | POA: Diagnosis not present

## 2018-05-17 DIAGNOSIS — E119 Type 2 diabetes mellitus without complications: Secondary | ICD-10-CM | POA: Diagnosis not present

## 2018-05-17 DIAGNOSIS — D72820 Lymphocytosis (symptomatic): Secondary | ICD-10-CM | POA: Diagnosis not present

## 2018-05-17 DIAGNOSIS — D62 Acute posthemorrhagic anemia: Secondary | ICD-10-CM | POA: Diagnosis not present

## 2018-05-17 DIAGNOSIS — Z7901 Long term (current) use of anticoagulants: Secondary | ICD-10-CM | POA: Diagnosis not present

## 2018-05-17 DIAGNOSIS — Z7409 Other reduced mobility: Secondary | ICD-10-CM | POA: Diagnosis not present

## 2018-05-18 ENCOUNTER — Telehealth: Payer: Self-pay | Admitting: *Deleted

## 2018-05-18 DIAGNOSIS — L89153 Pressure ulcer of sacral region, stage 3: Secondary | ICD-10-CM | POA: Diagnosis not present

## 2018-05-18 DIAGNOSIS — D72819 Decreased white blood cell count, unspecified: Secondary | ICD-10-CM | POA: Diagnosis not present

## 2018-05-18 DIAGNOSIS — I82403 Acute embolism and thrombosis of unspecified deep veins of lower extremity, bilateral: Secondary | ICD-10-CM | POA: Diagnosis not present

## 2018-05-18 DIAGNOSIS — Z7409 Other reduced mobility: Secondary | ICD-10-CM | POA: Diagnosis not present

## 2018-05-18 DIAGNOSIS — I10 Essential (primary) hypertension: Secondary | ICD-10-CM | POA: Diagnosis not present

## 2018-05-18 DIAGNOSIS — Z4800 Encounter for change or removal of nonsurgical wound dressing: Secondary | ICD-10-CM | POA: Diagnosis not present

## 2018-05-18 DIAGNOSIS — K529 Noninfective gastroenteritis and colitis, unspecified: Secondary | ICD-10-CM | POA: Diagnosis not present

## 2018-05-18 DIAGNOSIS — I69319 Unspecified symptoms and signs involving cognitive functions following cerebral infarction: Secondary | ICD-10-CM | POA: Diagnosis not present

## 2018-05-18 DIAGNOSIS — R413 Other amnesia: Secondary | ICD-10-CM | POA: Diagnosis not present

## 2018-05-18 DIAGNOSIS — Z7901 Long term (current) use of anticoagulants: Secondary | ICD-10-CM | POA: Diagnosis not present

## 2018-05-18 DIAGNOSIS — Z87891 Personal history of nicotine dependence: Secondary | ICD-10-CM | POA: Diagnosis not present

## 2018-05-18 DIAGNOSIS — S41102D Unspecified open wound of left upper arm, subsequent encounter: Secondary | ICD-10-CM | POA: Diagnosis not present

## 2018-05-18 DIAGNOSIS — I69391 Dysphagia following cerebral infarction: Secondary | ICD-10-CM | POA: Diagnosis not present

## 2018-05-18 DIAGNOSIS — I69398 Other sequelae of cerebral infarction: Secondary | ICD-10-CM | POA: Diagnosis not present

## 2018-05-18 DIAGNOSIS — R569 Unspecified convulsions: Secondary | ICD-10-CM | POA: Diagnosis not present

## 2018-05-18 DIAGNOSIS — E46 Unspecified protein-calorie malnutrition: Secondary | ICD-10-CM | POA: Diagnosis not present

## 2018-05-18 DIAGNOSIS — R1310 Dysphagia, unspecified: Secondary | ICD-10-CM | POA: Diagnosis not present

## 2018-05-18 DIAGNOSIS — E119 Type 2 diabetes mellitus without complications: Secondary | ICD-10-CM | POA: Diagnosis not present

## 2018-05-18 DIAGNOSIS — D62 Acute posthemorrhagic anemia: Secondary | ICD-10-CM | POA: Diagnosis not present

## 2018-05-18 DIAGNOSIS — D72820 Lymphocytosis (symptomatic): Secondary | ICD-10-CM | POA: Diagnosis not present

## 2018-05-18 NOTE — Telephone Encounter (Signed)
Alonna Minium, ST, [place] left a message asking for verbal orders to continue HHST one more visit. Medical record reviewed. Social work note reviewed.  Verbal orders given per office protocol

## 2018-05-19 DIAGNOSIS — D649 Anemia, unspecified: Secondary | ICD-10-CM | POA: Diagnosis not present

## 2018-05-19 DIAGNOSIS — E876 Hypokalemia: Secondary | ICD-10-CM | POA: Diagnosis not present

## 2018-05-19 DIAGNOSIS — J189 Pneumonia, unspecified organism: Secondary | ICD-10-CM | POA: Diagnosis not present

## 2018-05-19 DIAGNOSIS — E119 Type 2 diabetes mellitus without complications: Secondary | ICD-10-CM | POA: Diagnosis not present

## 2018-05-19 DIAGNOSIS — Z7901 Long term (current) use of anticoagulants: Secondary | ICD-10-CM | POA: Diagnosis not present

## 2018-05-19 DIAGNOSIS — I639 Cerebral infarction, unspecified: Secondary | ICD-10-CM | POA: Diagnosis not present

## 2018-05-20 DIAGNOSIS — L89154 Pressure ulcer of sacral region, stage 4: Secondary | ICD-10-CM | POA: Diagnosis not present

## 2018-05-20 NOTE — Telephone Encounter (Signed)
Placed a call to Ms. Martinique, no answer. Left message to return the call.

## 2018-05-21 DIAGNOSIS — L89154 Pressure ulcer of sacral region, stage 4: Secondary | ICD-10-CM | POA: Diagnosis not present

## 2018-05-21 DIAGNOSIS — E119 Type 2 diabetes mellitus without complications: Secondary | ICD-10-CM | POA: Diagnosis not present

## 2018-05-21 DIAGNOSIS — I10 Essential (primary) hypertension: Secondary | ICD-10-CM | POA: Diagnosis not present

## 2018-05-21 DIAGNOSIS — E43 Unspecified severe protein-calorie malnutrition: Secondary | ICD-10-CM | POA: Diagnosis not present

## 2018-05-21 DIAGNOSIS — Z86718 Personal history of other venous thrombosis and embolism: Secondary | ICD-10-CM | POA: Diagnosis not present

## 2018-05-24 DIAGNOSIS — E46 Unspecified protein-calorie malnutrition: Secondary | ICD-10-CM | POA: Diagnosis not present

## 2018-05-24 DIAGNOSIS — D72819 Decreased white blood cell count, unspecified: Secondary | ICD-10-CM | POA: Diagnosis not present

## 2018-05-24 DIAGNOSIS — I69398 Other sequelae of cerebral infarction: Secondary | ICD-10-CM | POA: Diagnosis not present

## 2018-05-24 DIAGNOSIS — I82403 Acute embolism and thrombosis of unspecified deep veins of lower extremity, bilateral: Secondary | ICD-10-CM | POA: Diagnosis not present

## 2018-05-24 DIAGNOSIS — Z87891 Personal history of nicotine dependence: Secondary | ICD-10-CM | POA: Diagnosis not present

## 2018-05-24 DIAGNOSIS — I10 Essential (primary) hypertension: Secondary | ICD-10-CM | POA: Diagnosis not present

## 2018-05-24 DIAGNOSIS — R569 Unspecified convulsions: Secondary | ICD-10-CM | POA: Diagnosis not present

## 2018-05-24 DIAGNOSIS — Z7901 Long term (current) use of anticoagulants: Secondary | ICD-10-CM | POA: Diagnosis not present

## 2018-05-24 DIAGNOSIS — K529 Noninfective gastroenteritis and colitis, unspecified: Secondary | ICD-10-CM | POA: Diagnosis not present

## 2018-05-24 DIAGNOSIS — Z7409 Other reduced mobility: Secondary | ICD-10-CM | POA: Diagnosis not present

## 2018-05-24 DIAGNOSIS — R413 Other amnesia: Secondary | ICD-10-CM | POA: Diagnosis not present

## 2018-05-24 DIAGNOSIS — I69391 Dysphagia following cerebral infarction: Secondary | ICD-10-CM | POA: Diagnosis not present

## 2018-05-24 DIAGNOSIS — Z4800 Encounter for change or removal of nonsurgical wound dressing: Secondary | ICD-10-CM | POA: Diagnosis not present

## 2018-05-24 DIAGNOSIS — D62 Acute posthemorrhagic anemia: Secondary | ICD-10-CM | POA: Diagnosis not present

## 2018-05-24 DIAGNOSIS — S41102D Unspecified open wound of left upper arm, subsequent encounter: Secondary | ICD-10-CM | POA: Diagnosis not present

## 2018-05-24 DIAGNOSIS — D72820 Lymphocytosis (symptomatic): Secondary | ICD-10-CM | POA: Diagnosis not present

## 2018-05-24 DIAGNOSIS — E119 Type 2 diabetes mellitus without complications: Secondary | ICD-10-CM | POA: Diagnosis not present

## 2018-05-24 DIAGNOSIS — R1310 Dysphagia, unspecified: Secondary | ICD-10-CM | POA: Diagnosis not present

## 2018-05-24 DIAGNOSIS — I69319 Unspecified symptoms and signs involving cognitive functions following cerebral infarction: Secondary | ICD-10-CM | POA: Diagnosis not present

## 2018-05-24 DIAGNOSIS — L89153 Pressure ulcer of sacral region, stage 3: Secondary | ICD-10-CM | POA: Diagnosis not present

## 2018-05-25 ENCOUNTER — Other Ambulatory Visit (HOSPITAL_COMMUNITY): Payer: Self-pay | Admitting: Physical Medicine and Rehabilitation

## 2018-05-25 DIAGNOSIS — Z87891 Personal history of nicotine dependence: Secondary | ICD-10-CM | POA: Diagnosis not present

## 2018-05-25 DIAGNOSIS — D72819 Decreased white blood cell count, unspecified: Secondary | ICD-10-CM

## 2018-05-25 DIAGNOSIS — Z7409 Other reduced mobility: Secondary | ICD-10-CM

## 2018-05-25 DIAGNOSIS — D72820 Lymphocytosis (symptomatic): Secondary | ICD-10-CM

## 2018-05-25 DIAGNOSIS — I82403 Acute embolism and thrombosis of unspecified deep veins of lower extremity, bilateral: Secondary | ICD-10-CM

## 2018-05-25 DIAGNOSIS — Z4800 Encounter for change or removal of nonsurgical wound dressing: Secondary | ICD-10-CM | POA: Diagnosis not present

## 2018-05-25 DIAGNOSIS — K529 Noninfective gastroenteritis and colitis, unspecified: Secondary | ICD-10-CM

## 2018-05-25 DIAGNOSIS — E119 Type 2 diabetes mellitus without complications: Secondary | ICD-10-CM | POA: Diagnosis not present

## 2018-05-25 DIAGNOSIS — E46 Unspecified protein-calorie malnutrition: Secondary | ICD-10-CM | POA: Diagnosis not present

## 2018-05-25 DIAGNOSIS — S41102D Unspecified open wound of left upper arm, subsequent encounter: Secondary | ICD-10-CM | POA: Diagnosis not present

## 2018-05-25 DIAGNOSIS — I69398 Other sequelae of cerebral infarction: Secondary | ICD-10-CM | POA: Diagnosis not present

## 2018-05-25 DIAGNOSIS — R569 Unspecified convulsions: Secondary | ICD-10-CM

## 2018-05-25 DIAGNOSIS — I69319 Unspecified symptoms and signs involving cognitive functions following cerebral infarction: Secondary | ICD-10-CM | POA: Diagnosis not present

## 2018-05-25 DIAGNOSIS — D62 Acute posthemorrhagic anemia: Secondary | ICD-10-CM | POA: Diagnosis not present

## 2018-05-25 DIAGNOSIS — Z7901 Long term (current) use of anticoagulants: Secondary | ICD-10-CM | POA: Diagnosis not present

## 2018-05-25 DIAGNOSIS — I10 Essential (primary) hypertension: Secondary | ICD-10-CM | POA: Diagnosis not present

## 2018-05-25 DIAGNOSIS — R1310 Dysphagia, unspecified: Secondary | ICD-10-CM | POA: Diagnosis not present

## 2018-05-25 DIAGNOSIS — F1099 Alcohol use, unspecified with unspecified alcohol-induced disorder: Secondary | ICD-10-CM

## 2018-05-25 DIAGNOSIS — L89153 Pressure ulcer of sacral region, stage 3: Secondary | ICD-10-CM | POA: Diagnosis not present

## 2018-05-25 DIAGNOSIS — R413 Other amnesia: Secondary | ICD-10-CM

## 2018-05-25 DIAGNOSIS — I69391 Dysphagia following cerebral infarction: Secondary | ICD-10-CM | POA: Diagnosis not present

## 2018-05-25 NOTE — Telephone Encounter (Signed)
Not an Rx, I am not sure why she is taking

## 2018-05-26 DIAGNOSIS — T8189XA Other complications of procedures, not elsewhere classified, initial encounter: Secondary | ICD-10-CM | POA: Diagnosis not present

## 2018-05-27 ENCOUNTER — Other Ambulatory Visit: Payer: Self-pay

## 2018-05-27 DIAGNOSIS — I82403 Acute embolism and thrombosis of unspecified deep veins of lower extremity, bilateral: Secondary | ICD-10-CM | POA: Diagnosis not present

## 2018-05-27 DIAGNOSIS — E119 Type 2 diabetes mellitus without complications: Secondary | ICD-10-CM | POA: Diagnosis not present

## 2018-05-27 DIAGNOSIS — R569 Unspecified convulsions: Secondary | ICD-10-CM | POA: Diagnosis not present

## 2018-05-27 DIAGNOSIS — D72819 Decreased white blood cell count, unspecified: Secondary | ICD-10-CM | POA: Diagnosis not present

## 2018-05-27 DIAGNOSIS — I69398 Other sequelae of cerebral infarction: Secondary | ICD-10-CM | POA: Diagnosis not present

## 2018-05-27 DIAGNOSIS — K529 Noninfective gastroenteritis and colitis, unspecified: Secondary | ICD-10-CM | POA: Diagnosis not present

## 2018-05-27 DIAGNOSIS — Z87891 Personal history of nicotine dependence: Secondary | ICD-10-CM | POA: Diagnosis not present

## 2018-05-27 DIAGNOSIS — Z7409 Other reduced mobility: Secondary | ICD-10-CM | POA: Diagnosis not present

## 2018-05-27 DIAGNOSIS — I69319 Unspecified symptoms and signs involving cognitive functions following cerebral infarction: Secondary | ICD-10-CM | POA: Diagnosis not present

## 2018-05-27 DIAGNOSIS — S41102D Unspecified open wound of left upper arm, subsequent encounter: Secondary | ICD-10-CM | POA: Diagnosis not present

## 2018-05-27 DIAGNOSIS — D62 Acute posthemorrhagic anemia: Secondary | ICD-10-CM | POA: Diagnosis not present

## 2018-05-27 DIAGNOSIS — I10 Essential (primary) hypertension: Secondary | ICD-10-CM | POA: Diagnosis not present

## 2018-05-27 DIAGNOSIS — Z7901 Long term (current) use of anticoagulants: Secondary | ICD-10-CM | POA: Diagnosis not present

## 2018-05-27 DIAGNOSIS — I69391 Dysphagia following cerebral infarction: Secondary | ICD-10-CM | POA: Diagnosis not present

## 2018-05-27 DIAGNOSIS — R1310 Dysphagia, unspecified: Secondary | ICD-10-CM | POA: Diagnosis not present

## 2018-05-27 DIAGNOSIS — Z4800 Encounter for change or removal of nonsurgical wound dressing: Secondary | ICD-10-CM | POA: Diagnosis not present

## 2018-05-27 DIAGNOSIS — R413 Other amnesia: Secondary | ICD-10-CM | POA: Diagnosis not present

## 2018-05-27 DIAGNOSIS — D72820 Lymphocytosis (symptomatic): Secondary | ICD-10-CM | POA: Diagnosis not present

## 2018-05-27 DIAGNOSIS — L89153 Pressure ulcer of sacral region, stage 3: Secondary | ICD-10-CM | POA: Diagnosis not present

## 2018-05-27 DIAGNOSIS — E46 Unspecified protein-calorie malnutrition: Secondary | ICD-10-CM | POA: Diagnosis not present

## 2018-05-27 NOTE — Patient Outreach (Signed)
[facility]) Care Management  05/27/2018  Maria Avila 07/22/1950 [id number]   Medication Adherence call to Mrs. Javonda Avila  spoke with patient's husband Lennette Bihari) he said he is not sure if patient is to be on Pravastatin 40 mg and Lisinopril 10 mg Mr.kevin went thru all her bottler an did not have this two medications .[facility] said last fill was on 01/2019 and patient has not fill the prescription since then ,left a message at doctor office to call me back or to call the patient to let then know if there are suppose to be on both medications. [name] is showing past due under [facility].    [facility] Management Direct Dial [phone]  Fax [phone] [email or web address]

## 2018-05-28 DIAGNOSIS — K529 Noninfective gastroenteritis and colitis, unspecified: Secondary | ICD-10-CM | POA: Diagnosis not present

## 2018-05-28 DIAGNOSIS — S41102D Unspecified open wound of left upper arm, subsequent encounter: Secondary | ICD-10-CM | POA: Diagnosis not present

## 2018-05-28 DIAGNOSIS — I82403 Acute embolism and thrombosis of unspecified deep veins of lower extremity, bilateral: Secondary | ICD-10-CM | POA: Diagnosis not present

## 2018-05-28 DIAGNOSIS — I10 Essential (primary) hypertension: Secondary | ICD-10-CM | POA: Diagnosis not present

## 2018-05-28 DIAGNOSIS — R1310 Dysphagia, unspecified: Secondary | ICD-10-CM | POA: Diagnosis not present

## 2018-05-28 DIAGNOSIS — Z87891 Personal history of nicotine dependence: Secondary | ICD-10-CM | POA: Diagnosis not present

## 2018-05-28 DIAGNOSIS — R413 Other amnesia: Secondary | ICD-10-CM | POA: Diagnosis not present

## 2018-05-28 DIAGNOSIS — E46 Unspecified protein-calorie malnutrition: Secondary | ICD-10-CM | POA: Diagnosis not present

## 2018-05-28 DIAGNOSIS — Z7901 Long term (current) use of anticoagulants: Secondary | ICD-10-CM | POA: Diagnosis not present

## 2018-05-28 DIAGNOSIS — D62 Acute posthemorrhagic anemia: Secondary | ICD-10-CM | POA: Diagnosis not present

## 2018-05-28 DIAGNOSIS — I69319 Unspecified symptoms and signs involving cognitive functions following cerebral infarction: Secondary | ICD-10-CM | POA: Diagnosis not present

## 2018-05-28 DIAGNOSIS — I69398 Other sequelae of cerebral infarction: Secondary | ICD-10-CM | POA: Diagnosis not present

## 2018-05-28 DIAGNOSIS — E119 Type 2 diabetes mellitus without complications: Secondary | ICD-10-CM | POA: Diagnosis not present

## 2018-05-28 DIAGNOSIS — D72819 Decreased white blood cell count, unspecified: Secondary | ICD-10-CM | POA: Diagnosis not present

## 2018-05-28 DIAGNOSIS — R569 Unspecified convulsions: Secondary | ICD-10-CM | POA: Diagnosis not present

## 2018-05-28 DIAGNOSIS — I69391 Dysphagia following cerebral infarction: Secondary | ICD-10-CM | POA: Diagnosis not present

## 2018-05-28 DIAGNOSIS — D72820 Lymphocytosis (symptomatic): Secondary | ICD-10-CM | POA: Diagnosis not present

## 2018-05-28 DIAGNOSIS — Z7409 Other reduced mobility: Secondary | ICD-10-CM | POA: Diagnosis not present

## 2018-05-28 DIAGNOSIS — Z4800 Encounter for change or removal of nonsurgical wound dressing: Secondary | ICD-10-CM | POA: Diagnosis not present

## 2018-05-28 DIAGNOSIS — L89153 Pressure ulcer of sacral region, stage 3: Secondary | ICD-10-CM | POA: Diagnosis not present

## 2018-06-01 DIAGNOSIS — T8189XA Other complications of procedures, not elsewhere classified, initial encounter: Secondary | ICD-10-CM | POA: Diagnosis not present

## 2018-06-01 DIAGNOSIS — I69398 Other sequelae of cerebral infarction: Secondary | ICD-10-CM | POA: Diagnosis not present

## 2018-06-01 DIAGNOSIS — R569 Unspecified convulsions: Secondary | ICD-10-CM | POA: Diagnosis not present

## 2018-06-01 DIAGNOSIS — Z7409 Other reduced mobility: Secondary | ICD-10-CM | POA: Diagnosis not present

## 2018-06-01 DIAGNOSIS — E119 Type 2 diabetes mellitus without complications: Secondary | ICD-10-CM | POA: Diagnosis not present

## 2018-06-01 DIAGNOSIS — I69319 Unspecified symptoms and signs involving cognitive functions following cerebral infarction: Secondary | ICD-10-CM | POA: Diagnosis not present

## 2018-06-01 DIAGNOSIS — D72820 Lymphocytosis (symptomatic): Secondary | ICD-10-CM | POA: Diagnosis not present

## 2018-06-01 DIAGNOSIS — I82403 Acute embolism and thrombosis of unspecified deep veins of lower extremity, bilateral: Secondary | ICD-10-CM | POA: Diagnosis not present

## 2018-06-01 DIAGNOSIS — K529 Noninfective gastroenteritis and colitis, unspecified: Secondary | ICD-10-CM | POA: Diagnosis not present

## 2018-06-01 DIAGNOSIS — D62 Acute posthemorrhagic anemia: Secondary | ICD-10-CM | POA: Diagnosis not present

## 2018-06-01 DIAGNOSIS — R1310 Dysphagia, unspecified: Secondary | ICD-10-CM | POA: Diagnosis not present

## 2018-06-01 DIAGNOSIS — Z4800 Encounter for change or removal of nonsurgical wound dressing: Secondary | ICD-10-CM | POA: Diagnosis not present

## 2018-06-01 DIAGNOSIS — I69391 Dysphagia following cerebral infarction: Secondary | ICD-10-CM | POA: Diagnosis not present

## 2018-06-01 DIAGNOSIS — D72819 Decreased white blood cell count, unspecified: Secondary | ICD-10-CM | POA: Diagnosis not present

## 2018-06-01 DIAGNOSIS — Z7901 Long term (current) use of anticoagulants: Secondary | ICD-10-CM | POA: Diagnosis not present

## 2018-06-01 DIAGNOSIS — R413 Other amnesia: Secondary | ICD-10-CM | POA: Diagnosis not present

## 2018-06-01 DIAGNOSIS — S41102D Unspecified open wound of left upper arm, subsequent encounter: Secondary | ICD-10-CM | POA: Diagnosis not present

## 2018-06-01 DIAGNOSIS — L89153 Pressure ulcer of sacral region, stage 3: Secondary | ICD-10-CM | POA: Diagnosis not present

## 2018-06-01 DIAGNOSIS — I639 Cerebral infarction, unspecified: Secondary | ICD-10-CM | POA: Diagnosis not present

## 2018-06-01 DIAGNOSIS — Z87891 Personal history of nicotine dependence: Secondary | ICD-10-CM | POA: Diagnosis not present

## 2018-06-01 DIAGNOSIS — L89154 Pressure ulcer of sacral region, stage 4: Secondary | ICD-10-CM | POA: Diagnosis not present

## 2018-06-01 DIAGNOSIS — E46 Unspecified protein-calorie malnutrition: Secondary | ICD-10-CM | POA: Diagnosis not present

## 2018-06-01 DIAGNOSIS — I10 Essential (primary) hypertension: Secondary | ICD-10-CM | POA: Diagnosis not present

## 2018-06-02 DIAGNOSIS — Z7409 Other reduced mobility: Secondary | ICD-10-CM | POA: Diagnosis not present

## 2018-06-02 DIAGNOSIS — Z87891 Personal history of nicotine dependence: Secondary | ICD-10-CM | POA: Diagnosis not present

## 2018-06-02 DIAGNOSIS — I10 Essential (primary) hypertension: Secondary | ICD-10-CM | POA: Diagnosis not present

## 2018-06-02 DIAGNOSIS — R569 Unspecified convulsions: Secondary | ICD-10-CM | POA: Diagnosis not present

## 2018-06-02 DIAGNOSIS — R1310 Dysphagia, unspecified: Secondary | ICD-10-CM | POA: Diagnosis not present

## 2018-06-02 DIAGNOSIS — K529 Noninfective gastroenteritis and colitis, unspecified: Secondary | ICD-10-CM | POA: Diagnosis not present

## 2018-06-02 DIAGNOSIS — E119 Type 2 diabetes mellitus without complications: Secondary | ICD-10-CM | POA: Diagnosis not present

## 2018-06-02 DIAGNOSIS — D62 Acute posthemorrhagic anemia: Secondary | ICD-10-CM | POA: Diagnosis not present

## 2018-06-02 DIAGNOSIS — I82403 Acute embolism and thrombosis of unspecified deep veins of lower extremity, bilateral: Secondary | ICD-10-CM | POA: Diagnosis not present

## 2018-06-02 DIAGNOSIS — R413 Other amnesia: Secondary | ICD-10-CM | POA: Diagnosis not present

## 2018-06-02 DIAGNOSIS — S41102D Unspecified open wound of left upper arm, subsequent encounter: Secondary | ICD-10-CM | POA: Diagnosis not present

## 2018-06-02 DIAGNOSIS — I69391 Dysphagia following cerebral infarction: Secondary | ICD-10-CM | POA: Diagnosis not present

## 2018-06-02 DIAGNOSIS — L89153 Pressure ulcer of sacral region, stage 3: Secondary | ICD-10-CM | POA: Diagnosis not present

## 2018-06-02 DIAGNOSIS — E46 Unspecified protein-calorie malnutrition: Secondary | ICD-10-CM | POA: Diagnosis not present

## 2018-06-02 DIAGNOSIS — Z4800 Encounter for change or removal of nonsurgical wound dressing: Secondary | ICD-10-CM | POA: Diagnosis not present

## 2018-06-02 DIAGNOSIS — I69398 Other sequelae of cerebral infarction: Secondary | ICD-10-CM | POA: Diagnosis not present

## 2018-06-02 DIAGNOSIS — I69319 Unspecified symptoms and signs involving cognitive functions following cerebral infarction: Secondary | ICD-10-CM | POA: Diagnosis not present

## 2018-06-02 DIAGNOSIS — D72820 Lymphocytosis (symptomatic): Secondary | ICD-10-CM | POA: Diagnosis not present

## 2018-06-02 DIAGNOSIS — D72819 Decreased white blood cell count, unspecified: Secondary | ICD-10-CM | POA: Diagnosis not present

## 2018-06-02 DIAGNOSIS — Z7901 Long term (current) use of anticoagulants: Secondary | ICD-10-CM | POA: Diagnosis not present

## 2018-06-03 DIAGNOSIS — D72820 Lymphocytosis (symptomatic): Secondary | ICD-10-CM | POA: Diagnosis not present

## 2018-06-03 DIAGNOSIS — E119 Type 2 diabetes mellitus without complications: Secondary | ICD-10-CM | POA: Diagnosis not present

## 2018-06-03 DIAGNOSIS — I69391 Dysphagia following cerebral infarction: Secondary | ICD-10-CM | POA: Diagnosis not present

## 2018-06-03 DIAGNOSIS — I82403 Acute embolism and thrombosis of unspecified deep veins of lower extremity, bilateral: Secondary | ICD-10-CM | POA: Diagnosis not present

## 2018-06-03 DIAGNOSIS — Z87891 Personal history of nicotine dependence: Secondary | ICD-10-CM | POA: Diagnosis not present

## 2018-06-03 DIAGNOSIS — I69398 Other sequelae of cerebral infarction: Secondary | ICD-10-CM | POA: Diagnosis not present

## 2018-06-03 DIAGNOSIS — I69319 Unspecified symptoms and signs involving cognitive functions following cerebral infarction: Secondary | ICD-10-CM | POA: Diagnosis not present

## 2018-06-03 DIAGNOSIS — I10 Essential (primary) hypertension: Secondary | ICD-10-CM | POA: Diagnosis not present

## 2018-06-03 DIAGNOSIS — E46 Unspecified protein-calorie malnutrition: Secondary | ICD-10-CM | POA: Diagnosis not present

## 2018-06-03 DIAGNOSIS — Z4800 Encounter for change or removal of nonsurgical wound dressing: Secondary | ICD-10-CM | POA: Diagnosis not present

## 2018-06-03 DIAGNOSIS — S41102D Unspecified open wound of left upper arm, subsequent encounter: Secondary | ICD-10-CM | POA: Diagnosis not present

## 2018-06-03 DIAGNOSIS — Z7409 Other reduced mobility: Secondary | ICD-10-CM | POA: Diagnosis not present

## 2018-06-03 DIAGNOSIS — D72819 Decreased white blood cell count, unspecified: Secondary | ICD-10-CM | POA: Diagnosis not present

## 2018-06-03 DIAGNOSIS — R413 Other amnesia: Secondary | ICD-10-CM | POA: Diagnosis not present

## 2018-06-03 DIAGNOSIS — K529 Noninfective gastroenteritis and colitis, unspecified: Secondary | ICD-10-CM | POA: Diagnosis not present

## 2018-06-03 DIAGNOSIS — D62 Acute posthemorrhagic anemia: Secondary | ICD-10-CM | POA: Diagnosis not present

## 2018-06-03 DIAGNOSIS — L89153 Pressure ulcer of sacral region, stage 3: Secondary | ICD-10-CM | POA: Diagnosis not present

## 2018-06-03 DIAGNOSIS — R569 Unspecified convulsions: Secondary | ICD-10-CM | POA: Diagnosis not present

## 2018-06-03 DIAGNOSIS — R1310 Dysphagia, unspecified: Secondary | ICD-10-CM | POA: Diagnosis not present

## 2018-06-03 DIAGNOSIS — Z7901 Long term (current) use of anticoagulants: Secondary | ICD-10-CM | POA: Diagnosis not present

## 2018-06-04 DIAGNOSIS — E43 Unspecified severe protein-calorie malnutrition: Secondary | ICD-10-CM | POA: Diagnosis not present

## 2018-06-04 DIAGNOSIS — E119 Type 2 diabetes mellitus without complications: Secondary | ICD-10-CM | POA: Diagnosis not present

## 2018-06-04 DIAGNOSIS — I10 Essential (primary) hypertension: Secondary | ICD-10-CM | POA: Diagnosis not present

## 2018-06-04 DIAGNOSIS — Z86718 Personal history of other venous thrombosis and embolism: Secondary | ICD-10-CM | POA: Diagnosis not present

## 2018-06-04 DIAGNOSIS — L89154 Pressure ulcer of sacral region, stage 4: Secondary | ICD-10-CM | POA: Diagnosis not present

## 2018-06-07 DIAGNOSIS — I69391 Dysphagia following cerebral infarction: Secondary | ICD-10-CM | POA: Diagnosis not present

## 2018-06-07 DIAGNOSIS — D62 Acute posthemorrhagic anemia: Secondary | ICD-10-CM | POA: Diagnosis not present

## 2018-06-07 DIAGNOSIS — K529 Noninfective gastroenteritis and colitis, unspecified: Secondary | ICD-10-CM | POA: Diagnosis not present

## 2018-06-07 DIAGNOSIS — T8189XA Other complications of procedures, not elsewhere classified, initial encounter: Secondary | ICD-10-CM | POA: Diagnosis not present

## 2018-06-07 DIAGNOSIS — R1310 Dysphagia, unspecified: Secondary | ICD-10-CM | POA: Diagnosis not present

## 2018-06-07 DIAGNOSIS — L89153 Pressure ulcer of sacral region, stage 3: Secondary | ICD-10-CM | POA: Diagnosis not present

## 2018-06-07 DIAGNOSIS — Z7409 Other reduced mobility: Secondary | ICD-10-CM | POA: Diagnosis not present

## 2018-06-07 DIAGNOSIS — I82403 Acute embolism and thrombosis of unspecified deep veins of lower extremity, bilateral: Secondary | ICD-10-CM | POA: Diagnosis not present

## 2018-06-07 DIAGNOSIS — I69398 Other sequelae of cerebral infarction: Secondary | ICD-10-CM | POA: Diagnosis not present

## 2018-06-07 DIAGNOSIS — R569 Unspecified convulsions: Secondary | ICD-10-CM | POA: Diagnosis not present

## 2018-06-07 DIAGNOSIS — Z87891 Personal history of nicotine dependence: Secondary | ICD-10-CM | POA: Diagnosis not present

## 2018-06-07 DIAGNOSIS — E46 Unspecified protein-calorie malnutrition: Secondary | ICD-10-CM | POA: Diagnosis not present

## 2018-06-07 DIAGNOSIS — E119 Type 2 diabetes mellitus without complications: Secondary | ICD-10-CM | POA: Diagnosis not present

## 2018-06-07 DIAGNOSIS — D72819 Decreased white blood cell count, unspecified: Secondary | ICD-10-CM | POA: Diagnosis not present

## 2018-06-07 DIAGNOSIS — I10 Essential (primary) hypertension: Secondary | ICD-10-CM | POA: Diagnosis not present

## 2018-06-07 DIAGNOSIS — Z4800 Encounter for change or removal of nonsurgical wound dressing: Secondary | ICD-10-CM | POA: Diagnosis not present

## 2018-06-07 DIAGNOSIS — S41102D Unspecified open wound of left upper arm, subsequent encounter: Secondary | ICD-10-CM | POA: Diagnosis not present

## 2018-06-07 DIAGNOSIS — R413 Other amnesia: Secondary | ICD-10-CM | POA: Diagnosis not present

## 2018-06-07 DIAGNOSIS — Z7901 Long term (current) use of anticoagulants: Secondary | ICD-10-CM | POA: Diagnosis not present

## 2018-06-07 DIAGNOSIS — D72820 Lymphocytosis (symptomatic): Secondary | ICD-10-CM | POA: Diagnosis not present

## 2018-06-07 DIAGNOSIS — I69319 Unspecified symptoms and signs involving cognitive functions following cerebral infarction: Secondary | ICD-10-CM | POA: Diagnosis not present

## 2018-06-09 ENCOUNTER — Other Ambulatory Visit: Payer: Self-pay | Admitting: Registered Nurse

## 2018-06-09 DIAGNOSIS — I10 Essential (primary) hypertension: Secondary | ICD-10-CM | POA: Diagnosis not present

## 2018-06-09 DIAGNOSIS — D72819 Decreased white blood cell count, unspecified: Secondary | ICD-10-CM | POA: Diagnosis not present

## 2018-06-09 DIAGNOSIS — K529 Noninfective gastroenteritis and colitis, unspecified: Secondary | ICD-10-CM | POA: Diagnosis not present

## 2018-06-09 DIAGNOSIS — I69391 Dysphagia following cerebral infarction: Secondary | ICD-10-CM | POA: Diagnosis not present

## 2018-06-09 DIAGNOSIS — D62 Acute posthemorrhagic anemia: Secondary | ICD-10-CM | POA: Diagnosis not present

## 2018-06-09 DIAGNOSIS — R1310 Dysphagia, unspecified: Secondary | ICD-10-CM | POA: Diagnosis not present

## 2018-06-09 DIAGNOSIS — L89153 Pressure ulcer of sacral region, stage 3: Secondary | ICD-10-CM | POA: Diagnosis not present

## 2018-06-09 DIAGNOSIS — I69319 Unspecified symptoms and signs involving cognitive functions following cerebral infarction: Secondary | ICD-10-CM | POA: Diagnosis not present

## 2018-06-09 DIAGNOSIS — I69398 Other sequelae of cerebral infarction: Secondary | ICD-10-CM | POA: Diagnosis not present

## 2018-06-09 DIAGNOSIS — R413 Other amnesia: Secondary | ICD-10-CM | POA: Diagnosis not present

## 2018-06-09 DIAGNOSIS — E46 Unspecified protein-calorie malnutrition: Secondary | ICD-10-CM | POA: Diagnosis not present

## 2018-06-09 DIAGNOSIS — S41102D Unspecified open wound of left upper arm, subsequent encounter: Secondary | ICD-10-CM | POA: Diagnosis not present

## 2018-06-09 DIAGNOSIS — E119 Type 2 diabetes mellitus without complications: Secondary | ICD-10-CM | POA: Diagnosis not present

## 2018-06-09 DIAGNOSIS — Z7409 Other reduced mobility: Secondary | ICD-10-CM | POA: Diagnosis not present

## 2018-06-09 DIAGNOSIS — D72820 Lymphocytosis (symptomatic): Secondary | ICD-10-CM | POA: Diagnosis not present

## 2018-06-09 DIAGNOSIS — Z87891 Personal history of nicotine dependence: Secondary | ICD-10-CM | POA: Diagnosis not present

## 2018-06-09 DIAGNOSIS — I82403 Acute embolism and thrombosis of unspecified deep veins of lower extremity, bilateral: Secondary | ICD-10-CM | POA: Diagnosis not present

## 2018-06-09 DIAGNOSIS — R569 Unspecified convulsions: Secondary | ICD-10-CM | POA: Diagnosis not present

## 2018-06-09 DIAGNOSIS — Z7901 Long term (current) use of anticoagulants: Secondary | ICD-10-CM | POA: Diagnosis not present

## 2018-06-09 DIAGNOSIS — Z4800 Encounter for change or removal of nonsurgical wound dressing: Secondary | ICD-10-CM | POA: Diagnosis not present

## 2018-06-10 ENCOUNTER — Encounter: Payer: Medicare Other | Attending: Physical Medicine & Rehabilitation

## 2018-06-10 ENCOUNTER — Ambulatory Visit: Payer: Medicare Other | Admitting: Physical Medicine & Rehabilitation

## 2018-06-10 ENCOUNTER — Encounter: Payer: Self-pay | Admitting: Physical Medicine & Rehabilitation

## 2018-06-10 VITALS — BP 135/93 | HR 111 | Resp 14

## 2018-06-10 DIAGNOSIS — I824Z3 Acute embolism and thrombosis of unspecified deep veins of distal lower extremity, bilateral: Secondary | ICD-10-CM | POA: Insufficient documentation

## 2018-06-10 DIAGNOSIS — E43 Unspecified severe protein-calorie malnutrition: Secondary | ICD-10-CM | POA: Diagnosis not present

## 2018-06-10 DIAGNOSIS — D62 Acute posthemorrhagic anemia: Secondary | ICD-10-CM | POA: Diagnosis not present

## 2018-06-10 DIAGNOSIS — I10 Essential (primary) hypertension: Secondary | ICD-10-CM | POA: Insufficient documentation

## 2018-06-10 DIAGNOSIS — I639 Cerebral infarction, unspecified: Secondary | ICD-10-CM | POA: Diagnosis not present

## 2018-06-10 DIAGNOSIS — L89154 Pressure ulcer of sacral region, stage 4: Secondary | ICD-10-CM | POA: Diagnosis not present

## 2018-06-10 DIAGNOSIS — I63412 Cerebral infarction due to embolism of left middle cerebral artery: Secondary | ICD-10-CM | POA: Insufficient documentation

## 2018-06-10 DIAGNOSIS — E876 Hypokalemia: Secondary | ICD-10-CM | POA: Insufficient documentation

## 2018-06-10 DIAGNOSIS — Z298 Encounter for other specified prophylactic measures: Secondary | ICD-10-CM | POA: Diagnosis not present

## 2018-06-10 DIAGNOSIS — I69351 Hemiplegia and hemiparesis following cerebral infarction affecting right dominant side: Secondary | ICD-10-CM | POA: Diagnosis not present

## 2018-06-10 NOTE — Progress Notes (Signed)
Subjective:    Patient ID: Maria Avila, female    DOB: Sep 14, 1950, 68 y.o.   MRN: [id number] She requires mod assist with basic ADL tasks. She requires min to mod assist for bed mobility. Min assist +2 with steady for transfers. She is able to perform sliding board transfers with min assist. She requires moderate assist for basic cognitive task due to ongoing issues with confusion and difficulty with processing. Extensive education was completed with patient and husband regarding need for bed level ADLs as well as use of Hoyer lift for transfers  Post extubation on 11 5 she was found to be lethargic with right-sided weakness, left gaze preference and mumbling speech.  CT perfusion done revealed left parietal lobe subacute infarct.  CTA head/neck was negative for large vessel occlusion.  Bilateral lower extremity Dopplers were positive for acute DVT in right posterior tibial vein and left peroneal veins.  She was started on IV heparin for treatment and transition to Xarelto.  Dr. Rigoberto Noel felt the stroke was embolic due to unknown source-/endocarditis versus hypercoagulable state due to sepsis. IV antibiotics narrowed to Levaquin at discharge.  Patient with visuospatial deficits, global weakness and moderate cognitive linguistic deficits affecting mobility and ADLs.    68 year old female with history of T2DM, HTN, heavy alcohol use, lymphocytosis/leocupenia, problems with colitis,N/V/D with 40 lbs weight loss and negative work up on 02/16/18 admission.  She was discharged to SNF for rehab but readmitted on 03/04/2018 with mental status changes, hypoxia and tachycardia due to sepsis.  She was found to have near complete opacification of the left chest due to empyema.  She was intubated and left chest tube placed with drainage of purulent material.  Dr. Roxan Hockey recommended Gastrografin swallow which was negative for perforation.  He felt that empyema was likely due to aspiration pneumonia and cultures positive  for E. coli, enterococcus and pseudomonas aeruginosa   HPI   Patient lives with her husband.  He is assisting the patient with ADLs and mobility.  Patient denies any new issues other than continued problems with wound care  Ambulates with gait belt and walker, no falls  Needs assist with dressing and bathing  Sees PCP in March  Sees Dr Leonie Man next week  Has wound vac for sacral wound, home health therapy is on hold due to wound VAC on the sacrum because of fears of dislodging.     Pain Inventory Average Pain 7 Pain Right Now 7 My pain is sharp  In the last 24 hours, has pain interfered with the following? General activity 0 Relation with others 0 Enjoyment of life 0 What TIME of day is your pain at its worst? morning, evening, night Sleep (in general) Fair  Pain is worse with: n/a Pain improves with: n/a Relief from Meds: 5  Mobility walk with assistance how many minutes can you walk? 10 ability to climb steps?  no do you drive?  no use a wheelchair needs help with transfers  Function retired I need assistance with the following:  dressing, bathing and toileting  Neuro/Psych numbness  Prior Studies Any changes since last visit?  no  Physicians involved in your care Any changes since last visit?  no   Family History  Problem Relation Age of Onset  . Alzheimer's disease Mother   . Colon cancer Father    Social History   Socioeconomic History  . Marital status: Married    Spouse name: Not on file  . Number of  children: Not on file  . Years of education: Not on file  . Highest education level: Not on file  Occupational History  . Not on file  Social Needs  . Financial resource strain: Not on file  . Food insecurity:    Worry: Not on file    Inability: Not on file  . Transportation needs:    Medical: Not on file    Non-medical: Not on file  Tobacco Use  . Smoking status: Former Research scientist (life sciences)  . Smokeless tobacco: Never Used  Substance and Sexual  Activity  . Alcohol use: Not Currently  . Drug use: Not Currently  . Sexual activity: Not on file  Lifestyle  . Physical activity:    Days per week: Not on file    Minutes per session: Not on file  . Stress: Not on file  Relationships  . Social connections:    Talks on phone: Not on file    Gets together: Not on file    Attends religious service: Not on file    Active member of club or organization: Not on file    Attends meetings of clubs or organizations: Not on file    Relationship status: Not on file  Other Topics Concern  . Not on file  Social History Narrative  . Not on file   Past Surgical History:  Procedure Laterality Date  . BIOPSY  02/26/2018   Procedure: BIOPSY;  Surgeon: Wilford Corner, MD;  Location: [place];  Service: Endoscopy;;  . COLONOSCOPY WITH PROPOFOL N/A 02/26/2018   Procedure: COLONOSCOPY WITH PROPOFOL;  Surgeon: Wilford Corner, MD;  Location: [place];  Service: Endoscopy;  Laterality: N/A;  . ESOPHAGOGASTRODUODENOSCOPY (EGD) WITH PROPOFOL N/A 02/26/2018   Procedure: ESOPHAGOGASTRODUODENOSCOPY (EGD) WITH PROPOFOL;  Surgeon: Wilford Corner, MD;  Location: [facility];  Service: Endoscopy;  Laterality: N/A;  . NO PAST SURGERIES     UNCERTAIN OF NAME OF SURGERY   Past Medical History:  Diagnosis Date  . Colitis   . Diabetes mellitus without complication ([place])   . Hypertension   . Malnutrition ([place])    BP (!) 135/93   Pulse (!) 111   Resp 14   SpO2 97%   Opioid Risk Score:   Fall Risk Score:  `1  Depression screen PHQ 2/9  Depression screen PHQ 2/9 05/13/2018  Decreased Interest 0  Down, Depressed, Hopeless 0  PHQ - 2 Score 0  Altered sleeping 0  Tired, decreased energy 0  Change in appetite 0  Feeling bad or failure about yourself  0  Trouble concentrating 0  Moving slowly or fidgety/restless 0  Suicidal thoughts 0  PHQ-9 Score 0    Review of Systems  Constitutional: Negative.   HENT: Negative.   Eyes: Negative.    Respiratory: Negative.   Cardiovascular: Negative.   Gastrointestinal: Negative.   Endocrine: Negative.   Genitourinary: Negative.   Musculoskeletal: Positive for gait problem.  Skin: Positive for wound.  Allergic/Immunologic: Negative.   Hematological: Negative.   Psychiatric/Behavioral: Negative.   All other systems reviewed and are negative.      Objective:   Physical Exam Vitals signs and nursing note reviewed.  Constitutional:      Appearance: Normal appearance.  HENT:     Head: Normocephalic and atraumatic.  Eyes:     Conjunctiva/sclera: Conjunctivae normal.     Pupils: Pupils are equal, round, and reactive to light.     Comments: Partial inter-nuclear ophthalmoplegia affecting right pupil with inability to track to the  nasal field Visual fields are intact confrontation testing  Cardiovascular:     Rate and Rhythm: Normal rate and regular rhythm.     Heart sounds: Normal heart sounds. No murmur.  Pulmonary:     Effort: Pulmonary effort is normal. No respiratory distress.     Breath sounds: Normal breath sounds. No stridor.  Abdominal:     General: Abdomen is flat. Bowel sounds are normal. There is no distension.     Palpations: Abdomen is soft.  Skin:    General: Skin is warm and dry.  Neurological:     Mental Status: She is alert.     Comments: Motor strength is 4/5 in the right deltoid bicep tricep grip hip flexor knee extensor ankle dorsiflexor 5/5 in the left deltoid bicep tricep grip hip flexor knee extensor ankle dorsiflexor Sensation is reported as equal to light touch bilateral upper limbs however and the right lower limb there is reduced sensation at the ankle compared to the left lower limb Intact finger to thumb opposition in bilateral upper extremities no evidence of dysdiadochokinesis with rapid alternating supination pronation forearms Cerebellar testing shows no evidence of dysmetria finger-nose-finger Gait was not tested patient did not bring her  walker  Psychiatric:        Mood and Affect: Affect is blunt.        Speech: Speech is delayed.     Comments: No evidence of dysarthria or aphasia           Assessment & Plan:  #1.  Multiple infarcts well intubated for empyema during her hospitalization.  The largest infarct was left parietal.  She has residual right hemiparesis.  No signs of aphasia.  She does have decreased processing speed She would benefit from additional home health therapy unfortunately because of her wound VAC it is now on hold. I would anticipate they will be able to start with her again in the next several weeks.  She has a follow-up with wound center next week. I can see her back in 8 weeks  She will follow-up with neurology and the wound center next week Patient will follow-up with PCP in March

## 2018-06-11 DIAGNOSIS — L89153 Pressure ulcer of sacral region, stage 3: Secondary | ICD-10-CM | POA: Diagnosis not present

## 2018-06-11 DIAGNOSIS — Z4800 Encounter for change or removal of nonsurgical wound dressing: Secondary | ICD-10-CM | POA: Diagnosis not present

## 2018-06-11 DIAGNOSIS — R1310 Dysphagia, unspecified: Secondary | ICD-10-CM | POA: Diagnosis not present

## 2018-06-11 DIAGNOSIS — K529 Noninfective gastroenteritis and colitis, unspecified: Secondary | ICD-10-CM | POA: Diagnosis not present

## 2018-06-11 DIAGNOSIS — R569 Unspecified convulsions: Secondary | ICD-10-CM | POA: Diagnosis not present

## 2018-06-11 DIAGNOSIS — I69319 Unspecified symptoms and signs involving cognitive functions following cerebral infarction: Secondary | ICD-10-CM | POA: Diagnosis not present

## 2018-06-11 DIAGNOSIS — D72819 Decreased white blood cell count, unspecified: Secondary | ICD-10-CM | POA: Diagnosis not present

## 2018-06-11 DIAGNOSIS — D72820 Lymphocytosis (symptomatic): Secondary | ICD-10-CM | POA: Diagnosis not present

## 2018-06-11 DIAGNOSIS — I82403 Acute embolism and thrombosis of unspecified deep veins of lower extremity, bilateral: Secondary | ICD-10-CM | POA: Diagnosis not present

## 2018-06-11 DIAGNOSIS — Z7409 Other reduced mobility: Secondary | ICD-10-CM | POA: Diagnosis not present

## 2018-06-11 DIAGNOSIS — Z87891 Personal history of nicotine dependence: Secondary | ICD-10-CM | POA: Diagnosis not present

## 2018-06-11 DIAGNOSIS — I10 Essential (primary) hypertension: Secondary | ICD-10-CM | POA: Diagnosis not present

## 2018-06-11 DIAGNOSIS — E46 Unspecified protein-calorie malnutrition: Secondary | ICD-10-CM | POA: Diagnosis not present

## 2018-06-11 DIAGNOSIS — D62 Acute posthemorrhagic anemia: Secondary | ICD-10-CM | POA: Diagnosis not present

## 2018-06-11 DIAGNOSIS — I69391 Dysphagia following cerebral infarction: Secondary | ICD-10-CM | POA: Diagnosis not present

## 2018-06-11 DIAGNOSIS — I69398 Other sequelae of cerebral infarction: Secondary | ICD-10-CM | POA: Diagnosis not present

## 2018-06-11 DIAGNOSIS — E119 Type 2 diabetes mellitus without complications: Secondary | ICD-10-CM | POA: Diagnosis not present

## 2018-06-11 DIAGNOSIS — Z7901 Long term (current) use of anticoagulants: Secondary | ICD-10-CM | POA: Diagnosis not present

## 2018-06-11 DIAGNOSIS — R413 Other amnesia: Secondary | ICD-10-CM | POA: Diagnosis not present

## 2018-06-11 DIAGNOSIS — S41102D Unspecified open wound of left upper arm, subsequent encounter: Secondary | ICD-10-CM | POA: Diagnosis not present

## 2018-06-12 ENCOUNTER — Other Ambulatory Visit: Payer: Self-pay | Admitting: Registered Nurse

## 2018-06-14 DIAGNOSIS — I69319 Unspecified symptoms and signs involving cognitive functions following cerebral infarction: Secondary | ICD-10-CM | POA: Diagnosis not present

## 2018-06-14 DIAGNOSIS — S41102D Unspecified open wound of left upper arm, subsequent encounter: Secondary | ICD-10-CM | POA: Diagnosis not present

## 2018-06-14 DIAGNOSIS — R1310 Dysphagia, unspecified: Secondary | ICD-10-CM | POA: Diagnosis not present

## 2018-06-14 DIAGNOSIS — I10 Essential (primary) hypertension: Secondary | ICD-10-CM | POA: Diagnosis not present

## 2018-06-14 DIAGNOSIS — E119 Type 2 diabetes mellitus without complications: Secondary | ICD-10-CM | POA: Diagnosis not present

## 2018-06-14 DIAGNOSIS — E46 Unspecified protein-calorie malnutrition: Secondary | ICD-10-CM | POA: Diagnosis not present

## 2018-06-14 DIAGNOSIS — Z4800 Encounter for change or removal of nonsurgical wound dressing: Secondary | ICD-10-CM | POA: Diagnosis not present

## 2018-06-14 DIAGNOSIS — D62 Acute posthemorrhagic anemia: Secondary | ICD-10-CM | POA: Diagnosis not present

## 2018-06-14 DIAGNOSIS — R569 Unspecified convulsions: Secondary | ICD-10-CM | POA: Diagnosis not present

## 2018-06-14 DIAGNOSIS — I82403 Acute embolism and thrombosis of unspecified deep veins of lower extremity, bilateral: Secondary | ICD-10-CM | POA: Diagnosis not present

## 2018-06-14 DIAGNOSIS — T8189XA Other complications of procedures, not elsewhere classified, initial encounter: Secondary | ICD-10-CM | POA: Diagnosis not present

## 2018-06-14 DIAGNOSIS — D72819 Decreased white blood cell count, unspecified: Secondary | ICD-10-CM | POA: Diagnosis not present

## 2018-06-14 DIAGNOSIS — K529 Noninfective gastroenteritis and colitis, unspecified: Secondary | ICD-10-CM | POA: Diagnosis not present

## 2018-06-14 DIAGNOSIS — I69391 Dysphagia following cerebral infarction: Secondary | ICD-10-CM | POA: Diagnosis not present

## 2018-06-14 DIAGNOSIS — D72820 Lymphocytosis (symptomatic): Secondary | ICD-10-CM | POA: Diagnosis not present

## 2018-06-14 DIAGNOSIS — L89153 Pressure ulcer of sacral region, stage 3: Secondary | ICD-10-CM | POA: Diagnosis not present

## 2018-06-14 DIAGNOSIS — I69398 Other sequelae of cerebral infarction: Secondary | ICD-10-CM | POA: Diagnosis not present

## 2018-06-14 DIAGNOSIS — R413 Other amnesia: Secondary | ICD-10-CM | POA: Diagnosis not present

## 2018-06-14 DIAGNOSIS — Z7409 Other reduced mobility: Secondary | ICD-10-CM | POA: Diagnosis not present

## 2018-06-14 DIAGNOSIS — Z7901 Long term (current) use of anticoagulants: Secondary | ICD-10-CM | POA: Diagnosis not present

## 2018-06-14 DIAGNOSIS — Z87891 Personal history of nicotine dependence: Secondary | ICD-10-CM | POA: Diagnosis not present

## 2018-06-15 ENCOUNTER — Encounter: Payer: Self-pay | Admitting: Neurology

## 2018-06-15 ENCOUNTER — Ambulatory Visit (INDEPENDENT_AMBULATORY_CARE_PROVIDER_SITE_OTHER): Payer: Medicare Other | Admitting: Neurology

## 2018-06-15 VITALS — BP 131/89 | HR 94 | Ht 62.0 in | Wt 108.0 lb

## 2018-06-15 DIAGNOSIS — I639 Cerebral infarction, unspecified: Secondary | ICD-10-CM | POA: Diagnosis not present

## 2018-06-15 DIAGNOSIS — R413 Other amnesia: Secondary | ICD-10-CM | POA: Diagnosis not present

## 2018-06-15 DIAGNOSIS — I69391 Dysphagia following cerebral infarction: Secondary | ICD-10-CM | POA: Diagnosis not present

## 2018-06-15 DIAGNOSIS — R269 Unspecified abnormalities of gait and mobility: Secondary | ICD-10-CM

## 2018-06-15 NOTE — Patient Instructions (Signed)
I had a long d/w patient  And her husband about his recent multiple embolic strokes, risk for recurrent stroke/TIAs, personally independently reviewed imaging studies and stroke evaluation results and answered questions.Continue Eliquis (apixaban) daily  for secondary stroke prevention given h/o recent DVT  X 3 more months and then change to aspirin and maintain strict control of hypertension with blood pressure goal below 130/90, diabetes with hemoglobin A1c goal below 6.5% and lipids with LDL cholesterol goal below 70 mg/dL. I also advised the patient to eat a healthy diet with plenty of whole grains, cereals, fruits and vegetables, exercise regularly and maintain ideal body weight.  He was encouraged to use a walker for ambulation and safety.  I recommend outpatient physical and occupational therapy.  Followup in the future with my nurse practitioner Janett Billow in 3 months or call earlier if necessary  Stroke Prevention Some medical conditions and behaviors are associated with a higher chance of having a stroke. You can help prevent a stroke by making nutrition, lifestyle, and other changes, including managing any medical conditions you may have. What nutrition changes can be made?   Eat healthy foods. You can do this by: ? Choosing foods high in fiber, such as fresh fruits and vegetables and whole grains. ? Eating at least 5 or more servings of fruits and vegetables a day. Try to fill half of your plate at each meal with fruits and vegetables. ? Choosing lean protein foods, such as lean cuts of meat, poultry without skin, fish, tofu, beans, and nuts. ? Eating low-fat dairy products. ? Avoiding foods that are high in salt (sodium). This can help lower blood pressure. ? Avoiding foods that have saturated fat, trans fat, and cholesterol. This can help prevent high cholesterol. ? Avoiding processed and premade foods.  Follow your health care provider's specific guidelines for losing weight, controlling  high blood pressure (hypertension), lowering high cholesterol, and managing diabetes. These may include: ? Reducing your daily calorie intake. ? Limiting your daily sodium intake to 1,500 milligrams (mg). ? Using only healthy fats for cooking, such as olive oil, canola oil, or sunflower oil. ? Counting your daily carbohydrate intake. What lifestyle changes can be made?  Maintain a healthy weight. Talk to your health care provider about your ideal weight.  Get at least 30 minutes of moderate physical activity at least 5 days a week. Moderate activity includes brisk walking, biking, and swimming.  Do not use any products that contain nicotine or tobacco, such as cigarettes and e-cigarettes. If you need help quitting, ask your health care provider. It may also be helpful to avoid exposure to secondhand smoke.  Limit alcohol intake to no more than 1 drink a day for nonpregnant women and 2 drinks a day for men. One drink equals 12 oz of beer, 5 oz of wine, or 1 oz of hard liquor.  Stop any illegal drug use.  Avoid taking birth control pills. Talk to your health care provider about the risks of taking birth control pills if: ? You are over 44 years old. ? You smoke. ? You get migraines. ? You have ever had a blood clot. What other changes can be made?  Manage your cholesterol levels. ? Eating a healthy diet is important for preventing high cholesterol. If cholesterol cannot be managed through diet alone, you may also need to take medicines. ? Take any prescribed medicines to control your cholesterol as told by your health care provider.  Manage your diabetes. ? Eating a  healthy diet and exercising regularly are important parts of managing your blood sugar. If your blood sugar cannot be managed through diet and exercise, you may need to take medicines. ? Take any prescribed medicines to control your diabetes as told by your health care provider.  Control your hypertension. ? To reduce your  risk of stroke, try to keep your blood pressure below 130/80. ? Eating a healthy diet and exercising regularly are an important part of controlling your blood pressure. If your blood pressure cannot be managed through diet and exercise, you may need to take medicines. ? Take any prescribed medicines to control hypertension as told by your health care provider. ? Ask your health care provider if you should monitor your blood pressure at home. ? Have your blood pressure checked every year, even if your blood pressure is normal. Blood pressure increases with age and some medical conditions.  Get evaluated for sleep disorders (sleep apnea). Talk to your health care provider about getting a sleep evaluation if you snore a lot or have excessive sleepiness.  Take over-the-counter and prescription medicines only as told by your health care provider. Aspirin or blood thinners (antiplatelets or anticoagulants) may be recommended to reduce your risk of forming blood clots that can lead to stroke.  Make sure that any other medical conditions you have, such as atrial fibrillation or atherosclerosis, are managed. What are the warning signs of a stroke? The warning signs of a stroke can be easily remembered as BEFAST.  B is for balance. Signs include: ? Dizziness. ? Loss of balance or coordination. ? Sudden trouble walking.  E is for eyes. Signs include: ? A sudden change in vision. ? Trouble seeing.  F is for face. Signs include: ? Sudden weakness or numbness of the face. ? The face or eyelid drooping to one side.  A is for arms. Signs include: ? Sudden weakness or numbness of the arm, usually on one side of the body.  S is for speech. Signs include: ? Trouble speaking (aphasia). ? Trouble understanding.  T is for time. ? These symptoms may represent a serious problem that is an emergency. Do not wait to see if the symptoms will go away. Get medical help right away. Call your local emergency  services (911 in the [place] Do not drive yourself to the hospital.  Other signs of stroke may include: ? A sudden, severe headache with no known cause. ? Nausea or vomiting. ? Seizure. Where to find more information For more information, visit:  American Stroke Association: www.strokeassociation.org  National Stroke Association: www.stroke.org Summary  You can prevent a stroke by eating healthy, exercising, not smoking, limiting alcohol intake, and managing any medical conditions you may have.  Do not use any products that contain nicotine or tobacco, such as cigarettes and e-cigarettes. If you need help quitting, ask your health care provider. It may also be helpful to avoid exposure to secondhand smoke.  Remember BEFAST for warning signs of stroke. Get help right away if you or a loved one has any of these signs. This information is not intended to replace advice given to you by your health care provider. Make sure you discuss any questions you have with your health care provider. Document Released: 05/29/2004 Document Revised: 05/27/2016 Document Reviewed: 05/27/2016 Elsevier Interactive Patient Education  2019 [employer].

## 2018-06-15 NOTE — Progress Notes (Signed)
[facility] [address] [place]. [place] [address] [phone]       OFFICE CONSULT NOTE  [name] Date of Birth:  1950/08/13 Medical Record Number:  [id number]   Referring MD: Rosalin Hawking Reason for Referral: Strokes HPI: Maria Avila is a 68 year old African-American lady seen today for initial office consultation visit.  She is accompanied by her husband.  History is obtained from them and review of electronic medical records.  I personally reviewed imaging films.  Maria Avila has a past medical history of malnutrition, hypertension, diabetes, colitis she apparently presented in septic shock from skilled nursing facility with respiratory failure and chest CT showing free air and fluid.  She had recently undergone endoscopy and had concern for esophageal rupture which may have caused empyema.  Initial CT scan of the head on admission did not show an acute stroke.  She was subsequently extubated and noticed as not moving the right side and having left gaze preference.  NIH stroke scale was 20. Repeat CT scan showed subacute infarct in left parietal lobe.  Neurology was consulted and subsequently MRI scan of the brain was obtained which showed small left parietal, left MCA/ACA watershed, right parietal and pontine punctate infarcts which were felt to be of embolic etiology.  CT angiogram of the brain and neck did not show any significant large vessel stenosis or occlusion.  Interestingly the patient previously had an MRI scan of the brain on 02/16/2018 which did not show an infarct but follow-up MRI on 03/10/2018 showed above-mentioned infarcts.  Lower extremity venous Dopplers showed bilateral deep vein thrombosis.  Transcranial Doppler bubble study was negative for PFO.  TEE could not be done because of fear of questionable esophageal perforation.  2D echo showed normal ejection fraction.  LDL cholesterol was 62 mg percent and hemoglobin A1c was 5.1.  Patient was initially  started on IV heparin for few days and subsequently switched to Eliquis.  She was transferred for rehab.  Patient has made gradual recovery.  She is currently living at home with her husband.  She is just finished home physical and occupational therapy.  She still needs 1 person assist to get up and is able to walk with a walker with her husband holding onto her.  The family were recommended outpatient therapy but they are thinking about it because of cost of co-pay.  The patient states her speech is much better.  Her mind is also improving though occasionally she gets confused.  She has obtained good improvement in the strength on the left side though she still has mild weakness in her legs.  She is tolerating Eliquis well without bleeding or bruising.  She remains on Keppra which is tolerating well.  She had 2 EEGs on the hospitalization both of which had done shown generalized slowing but she was started on Keppra for questionable seizures.  She has not had any further breakthrough seizures  ROS:   14 system review of systems is positive for cough, headache, restless legs, runny nose, gait difficulty, weakness, itching and all systems negative  PMH:  Past Medical History:  Diagnosis Date  . Colitis   . Diabetes mellitus without complication ([place])   . Hypertension   . Malnutrition ([place])     Social History:  Social History   Socioeconomic History  . Marital status: Married    Spouse name: Not on file  . Number of children: Not on file  . Years of education: Not on file  .  Highest education level: Not on file  Occupational History  . Not on file  Social Needs  . Financial resource strain: Not on file  . Food insecurity:    Worry: Not on file    Inability: Not on file  . Transportation needs:    Medical: Not on file    Non-medical: Not on file  Tobacco Use  . Smoking status: Former Research scientist (life sciences)  . Smokeless tobacco: Never Used  Substance and Sexual Activity  . Alcohol use: Not Currently   . Drug use: Not Currently  . Sexual activity: Not on file  Lifestyle  . Physical activity:    Days per week: Not on file    Minutes per session: Not on file  . Stress: Not on file  Relationships  . Social connections:    Talks on phone: Not on file    Gets together: Not on file    Attends religious service: Not on file    Active member of club or organization: Not on file    Attends meetings of clubs or organizations: Not on file    Relationship status: Not on file  . Intimate partner violence:    Fear of current or ex partner: Not on file    Emotionally abused: Not on file    Physically abused: Not on file    Forced sexual activity: Not on file  Other Topics Concern  . Not on file  Social History Narrative  . Not on file    Medications:   Current Outpatient Medications on File Prior to Visit  Medication Sig Dispense Refill  . acetaminophen (TYLENOL) 325 MG tablet Take 1-2 tablets (325-650 mg total) by mouth every 4 (four) hours as needed for mild pain.    Marland Kitchen apixaban (ELIQUIS) 5 MG TABS tablet Take 1 tablet (5 mg total) by mouth 2 (two) times daily. 60 tablet 0  . bethanechol (URECHOLINE) 10 MG tablet Take 1 tablet (10 mg total) by mouth 3 (three) times daily. 90 tablet 0  . collagenase (SANTYL) ointment Apply topically daily. To ulcer then cover with damp to dry dressing. 90 g 4  . [facility] VITAMIN C 500 MG tablet TAKE 1 TABLET BY MOUTH TWICE A DAY 100 tablet 0  . Hydrocortisone (GERHARDT'S BUTT CREAM) CREA Apply 1 application topically daily. 1 each 0  . lactobacillus acidophilus & bulgar (LACTINEX) chewable tablet Chew 1 tablet by mouth 3 (three) times daily with meals. 90 tablet 1  . levETIRAcetam (KEPPRA) 500 MG tablet Take 1 tablet (500 mg total) by mouth 2 (two) times daily. 60 tablet 0  . Multiple Vitamin (MULTIVITAMIN) capsule Take 1 capsule by mouth daily.    . polycarbophil (FIBERCON) 625 MG tablet Take 2 tablets (1,250 mg total) by mouth 2 (two) times daily. 120 tablet  0  . tamsulosin (FLOMAX) 0.4 MG CAPS capsule Take 1 capsule (0.4 mg total) by mouth daily after supper. 30 capsule 0  . thiamine (VITAMIN B-1) 100 MG tablet TAKE 1 TABLET BY MOUTH EVERY DAY 30 tablet 0   No current facility-administered medications on file prior to visit.     Allergies:   Allergies  Allergen Reactions  . Crestor [Rosuvastatin Calcium] Swelling and Other (See Comments)    Swelling of legs and muscle weakness  . Zocor [Simvastatin] Swelling and Other (See Comments)    Swelling of legs and muscle weakness    Physical Exam General: Frail malnourished looking middle-aged African-American lady seated, in no evident distress Head: head normocephalic  and atraumatic.   Neck: supple with no carotid or supraclavicular bruits Cardiovascular: regular rate and rhythm, no murmurs Musculoskeletal: no deformity Skin:  no rash/petichiae she has a wound VAC on the left posteriorly Vascular:  Normal pulses all extremities  Neurologic Exam Mental Status: Awake and fully alert. Oriented to place and time. Recent and remote memory intact. Attention span, concentration and fund of knowledge appropriate. Mood and affect appropriate.  Cranial Nerves: Fundoscopic exam reveals sharp disc margins. Pupils equal, briskly reactive to light. Extraocular movements full without nystagmus. Visual fields full to confrontation. Hearing intact. Facial sensation intact.  Mild left lower facial weakness, tongue, palate moves normally and symmetrically.  Motor: Normal bulk and tone. Normal strength in all tested extremity muscles.  Mild left grip weakness.  Diminished fine finger movements on the left.  Orbits right over left upper extremity.  Mild weakness of bilateral hip flexors and ankle dorsiflexors. Sensory.: intact to touch , pinprick , position and vibratory sensation.  Coordination: Rapid alternating movements normal in all extremities. Finger-to-nose and heel-to-shin performed accurately bilat      Stance is stooped.  Great difficulty in getting out of chair requires 2 person assist.  Gait demonstrates stooped posture and and ataxia mild proximal and distal weakness in both legs requiring 2 person assist  Reflexes: 2+ brisk and symmetric. Toes downgoing.   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  She has residual gait difficulties.     PLAN: I had a long d/w patient  And her husband about his recent multiple embolic strokes, risk for recurrent stroke/TIAs, personally independently reviewed imaging studies and stroke evaluation results and answered questions.Continue Eliquis (apixaban) daily  for secondary stroke prevention given h/o recent DVT  X 3 more months and then change to aspirin and maintain strict control of hypertension with blood pressure goal below 130/90, diabetes with hemoglobin A1c goal below 6.5% and lipids with LDL cholesterol goal below 70 mg/dL. I also advised the patient to eat a healthy diet with plenty of whole grains, cereals, fruits and vegetables, exercise regularly and maintain ideal body weight.  He was encouraged to use a walker for ambulation and safety.  I recommend outpatient physical and occupational therapy.  Greater than 50% time during this 45-minute consultation visit was spent on counseling and coordination of care about her multiple embolic strokes and answering questions followup in the future with my nurse practitioner Janett Billow in 3 months or call earlier if necessary Antony Contras, MD  [facility] [address] [place], [place] [address]  Phone [phone] Fax [phone] Note: This document was prepared with digital dictation and possible smart phrase technology. Any transcriptional errors that result from this process are unintentional.

## 2018-06-16 DIAGNOSIS — L89153 Pressure ulcer of sacral region, stage 3: Secondary | ICD-10-CM | POA: Diagnosis not present

## 2018-06-16 DIAGNOSIS — Z4800 Encounter for change or removal of nonsurgical wound dressing: Secondary | ICD-10-CM | POA: Diagnosis not present

## 2018-06-16 DIAGNOSIS — Z87891 Personal history of nicotine dependence: Secondary | ICD-10-CM | POA: Diagnosis not present

## 2018-06-16 DIAGNOSIS — Z7409 Other reduced mobility: Secondary | ICD-10-CM | POA: Diagnosis not present

## 2018-06-16 DIAGNOSIS — E46 Unspecified protein-calorie malnutrition: Secondary | ICD-10-CM | POA: Diagnosis not present

## 2018-06-16 DIAGNOSIS — E119 Type 2 diabetes mellitus without complications: Secondary | ICD-10-CM | POA: Diagnosis not present

## 2018-06-16 DIAGNOSIS — I82403 Acute embolism and thrombosis of unspecified deep veins of lower extremity, bilateral: Secondary | ICD-10-CM | POA: Diagnosis not present

## 2018-06-16 DIAGNOSIS — S41102D Unspecified open wound of left upper arm, subsequent encounter: Secondary | ICD-10-CM | POA: Diagnosis not present

## 2018-06-16 DIAGNOSIS — K529 Noninfective gastroenteritis and colitis, unspecified: Secondary | ICD-10-CM | POA: Diagnosis not present

## 2018-06-16 DIAGNOSIS — I69391 Dysphagia following cerebral infarction: Secondary | ICD-10-CM | POA: Diagnosis not present

## 2018-06-16 DIAGNOSIS — R1310 Dysphagia, unspecified: Secondary | ICD-10-CM | POA: Diagnosis not present

## 2018-06-16 DIAGNOSIS — D62 Acute posthemorrhagic anemia: Secondary | ICD-10-CM | POA: Diagnosis not present

## 2018-06-16 DIAGNOSIS — R569 Unspecified convulsions: Secondary | ICD-10-CM | POA: Diagnosis not present

## 2018-06-16 DIAGNOSIS — I69319 Unspecified symptoms and signs involving cognitive functions following cerebral infarction: Secondary | ICD-10-CM | POA: Diagnosis not present

## 2018-06-16 DIAGNOSIS — D72820 Lymphocytosis (symptomatic): Secondary | ICD-10-CM | POA: Diagnosis not present

## 2018-06-16 DIAGNOSIS — I69398 Other sequelae of cerebral infarction: Secondary | ICD-10-CM | POA: Diagnosis not present

## 2018-06-16 DIAGNOSIS — I10 Essential (primary) hypertension: Secondary | ICD-10-CM | POA: Diagnosis not present

## 2018-06-16 DIAGNOSIS — D72819 Decreased white blood cell count, unspecified: Secondary | ICD-10-CM | POA: Diagnosis not present

## 2018-06-16 DIAGNOSIS — Z7901 Long term (current) use of anticoagulants: Secondary | ICD-10-CM | POA: Diagnosis not present

## 2018-06-16 DIAGNOSIS — R413 Other amnesia: Secondary | ICD-10-CM | POA: Diagnosis not present

## 2018-06-18 ENCOUNTER — Encounter (HOSPITAL_BASED_OUTPATIENT_CLINIC_OR_DEPARTMENT_OTHER): Payer: Medicare Other | Attending: Internal Medicine

## 2018-06-18 DIAGNOSIS — I10 Essential (primary) hypertension: Secondary | ICD-10-CM | POA: Insufficient documentation

## 2018-06-18 DIAGNOSIS — Z86718 Personal history of other venous thrombosis and embolism: Secondary | ICD-10-CM | POA: Diagnosis not present

## 2018-06-18 DIAGNOSIS — L89154 Pressure ulcer of sacral region, stage 4: Secondary | ICD-10-CM | POA: Diagnosis not present

## 2018-06-18 DIAGNOSIS — E119 Type 2 diabetes mellitus without complications: Secondary | ICD-10-CM | POA: Diagnosis not present

## 2018-06-19 DIAGNOSIS — L89154 Pressure ulcer of sacral region, stage 4: Secondary | ICD-10-CM | POA: Diagnosis not present

## 2018-06-21 DIAGNOSIS — E119 Type 2 diabetes mellitus without complications: Secondary | ICD-10-CM | POA: Diagnosis not present

## 2018-06-21 DIAGNOSIS — R413 Other amnesia: Secondary | ICD-10-CM | POA: Diagnosis not present

## 2018-06-21 DIAGNOSIS — D72820 Lymphocytosis (symptomatic): Secondary | ICD-10-CM | POA: Diagnosis not present

## 2018-06-21 DIAGNOSIS — R569 Unspecified convulsions: Secondary | ICD-10-CM | POA: Diagnosis not present

## 2018-06-21 DIAGNOSIS — Z7901 Long term (current) use of anticoagulants: Secondary | ICD-10-CM | POA: Diagnosis not present

## 2018-06-21 DIAGNOSIS — D62 Acute posthemorrhagic anemia: Secondary | ICD-10-CM | POA: Diagnosis not present

## 2018-06-21 DIAGNOSIS — R1310 Dysphagia, unspecified: Secondary | ICD-10-CM | POA: Diagnosis not present

## 2018-06-21 DIAGNOSIS — I82403 Acute embolism and thrombosis of unspecified deep veins of lower extremity, bilateral: Secondary | ICD-10-CM | POA: Diagnosis not present

## 2018-06-21 DIAGNOSIS — Z7409 Other reduced mobility: Secondary | ICD-10-CM | POA: Diagnosis not present

## 2018-06-21 DIAGNOSIS — L89153 Pressure ulcer of sacral region, stage 3: Secondary | ICD-10-CM | POA: Diagnosis not present

## 2018-06-21 DIAGNOSIS — D72819 Decreased white blood cell count, unspecified: Secondary | ICD-10-CM | POA: Diagnosis not present

## 2018-06-21 DIAGNOSIS — Z87891 Personal history of nicotine dependence: Secondary | ICD-10-CM | POA: Diagnosis not present

## 2018-06-21 DIAGNOSIS — Z4800 Encounter for change or removal of nonsurgical wound dressing: Secondary | ICD-10-CM | POA: Diagnosis not present

## 2018-06-21 DIAGNOSIS — K529 Noninfective gastroenteritis and colitis, unspecified: Secondary | ICD-10-CM | POA: Diagnosis not present

## 2018-06-21 DIAGNOSIS — I69398 Other sequelae of cerebral infarction: Secondary | ICD-10-CM | POA: Diagnosis not present

## 2018-06-21 DIAGNOSIS — I10 Essential (primary) hypertension: Secondary | ICD-10-CM | POA: Diagnosis not present

## 2018-06-21 DIAGNOSIS — I69319 Unspecified symptoms and signs involving cognitive functions following cerebral infarction: Secondary | ICD-10-CM | POA: Diagnosis not present

## 2018-06-21 DIAGNOSIS — I69391 Dysphagia following cerebral infarction: Secondary | ICD-10-CM | POA: Diagnosis not present

## 2018-06-21 DIAGNOSIS — E46 Unspecified protein-calorie malnutrition: Secondary | ICD-10-CM | POA: Diagnosis not present

## 2018-06-21 DIAGNOSIS — S41102D Unspecified open wound of left upper arm, subsequent encounter: Secondary | ICD-10-CM | POA: Diagnosis not present

## 2018-06-23 DIAGNOSIS — E46 Unspecified protein-calorie malnutrition: Secondary | ICD-10-CM | POA: Diagnosis not present

## 2018-06-23 DIAGNOSIS — K529 Noninfective gastroenteritis and colitis, unspecified: Secondary | ICD-10-CM | POA: Diagnosis not present

## 2018-06-23 DIAGNOSIS — R413 Other amnesia: Secondary | ICD-10-CM | POA: Diagnosis not present

## 2018-06-23 DIAGNOSIS — L89153 Pressure ulcer of sacral region, stage 3: Secondary | ICD-10-CM | POA: Diagnosis not present

## 2018-06-23 DIAGNOSIS — S41102D Unspecified open wound of left upper arm, subsequent encounter: Secondary | ICD-10-CM | POA: Diagnosis not present

## 2018-06-23 DIAGNOSIS — Z7689 Persons encountering health services in other specified circumstances: Secondary | ICD-10-CM | POA: Diagnosis not present

## 2018-06-23 DIAGNOSIS — R569 Unspecified convulsions: Secondary | ICD-10-CM | POA: Diagnosis not present

## 2018-06-23 DIAGNOSIS — Z7901 Long term (current) use of anticoagulants: Secondary | ICD-10-CM | POA: Diagnosis not present

## 2018-06-23 DIAGNOSIS — I69319 Unspecified symptoms and signs involving cognitive functions following cerebral infarction: Secondary | ICD-10-CM | POA: Diagnosis not present

## 2018-06-23 DIAGNOSIS — D62 Acute posthemorrhagic anemia: Secondary | ICD-10-CM | POA: Diagnosis not present

## 2018-06-23 DIAGNOSIS — Z7409 Other reduced mobility: Secondary | ICD-10-CM | POA: Diagnosis not present

## 2018-06-23 DIAGNOSIS — Z4800 Encounter for change or removal of nonsurgical wound dressing: Secondary | ICD-10-CM | POA: Diagnosis not present

## 2018-06-23 DIAGNOSIS — Z87891 Personal history of nicotine dependence: Secondary | ICD-10-CM | POA: Diagnosis not present

## 2018-06-23 DIAGNOSIS — I69391 Dysphagia following cerebral infarction: Secondary | ICD-10-CM | POA: Diagnosis not present

## 2018-06-23 DIAGNOSIS — D72820 Lymphocytosis (symptomatic): Secondary | ICD-10-CM | POA: Diagnosis not present

## 2018-06-23 DIAGNOSIS — I82403 Acute embolism and thrombosis of unspecified deep veins of lower extremity, bilateral: Secondary | ICD-10-CM | POA: Diagnosis not present

## 2018-06-23 DIAGNOSIS — I10 Essential (primary) hypertension: Secondary | ICD-10-CM | POA: Diagnosis not present

## 2018-06-23 DIAGNOSIS — D72819 Decreased white blood cell count, unspecified: Secondary | ICD-10-CM | POA: Diagnosis not present

## 2018-06-23 DIAGNOSIS — E119 Type 2 diabetes mellitus without complications: Secondary | ICD-10-CM | POA: Diagnosis not present

## 2018-06-23 DIAGNOSIS — R1310 Dysphagia, unspecified: Secondary | ICD-10-CM | POA: Diagnosis not present

## 2018-06-23 DIAGNOSIS — I69398 Other sequelae of cerebral infarction: Secondary | ICD-10-CM | POA: Diagnosis not present

## 2018-06-25 DIAGNOSIS — Z7409 Other reduced mobility: Secondary | ICD-10-CM | POA: Diagnosis not present

## 2018-06-25 DIAGNOSIS — I69319 Unspecified symptoms and signs involving cognitive functions following cerebral infarction: Secondary | ICD-10-CM | POA: Diagnosis not present

## 2018-06-25 DIAGNOSIS — I82403 Acute embolism and thrombosis of unspecified deep veins of lower extremity, bilateral: Secondary | ICD-10-CM | POA: Diagnosis not present

## 2018-06-25 DIAGNOSIS — R1310 Dysphagia, unspecified: Secondary | ICD-10-CM | POA: Diagnosis not present

## 2018-06-25 DIAGNOSIS — R569 Unspecified convulsions: Secondary | ICD-10-CM | POA: Diagnosis not present

## 2018-06-25 DIAGNOSIS — K529 Noninfective gastroenteritis and colitis, unspecified: Secondary | ICD-10-CM | POA: Diagnosis not present

## 2018-06-25 DIAGNOSIS — D72820 Lymphocytosis (symptomatic): Secondary | ICD-10-CM | POA: Diagnosis not present

## 2018-06-25 DIAGNOSIS — R413 Other amnesia: Secondary | ICD-10-CM | POA: Diagnosis not present

## 2018-06-25 DIAGNOSIS — I69391 Dysphagia following cerebral infarction: Secondary | ICD-10-CM | POA: Diagnosis not present

## 2018-06-25 DIAGNOSIS — D72819 Decreased white blood cell count, unspecified: Secondary | ICD-10-CM | POA: Diagnosis not present

## 2018-06-25 DIAGNOSIS — I10 Essential (primary) hypertension: Secondary | ICD-10-CM | POA: Diagnosis not present

## 2018-06-25 DIAGNOSIS — Z87891 Personal history of nicotine dependence: Secondary | ICD-10-CM | POA: Diagnosis not present

## 2018-06-25 DIAGNOSIS — Z4800 Encounter for change or removal of nonsurgical wound dressing: Secondary | ICD-10-CM | POA: Diagnosis not present

## 2018-06-25 DIAGNOSIS — D62 Acute posthemorrhagic anemia: Secondary | ICD-10-CM | POA: Diagnosis not present

## 2018-06-25 DIAGNOSIS — E119 Type 2 diabetes mellitus without complications: Secondary | ICD-10-CM | POA: Diagnosis not present

## 2018-06-25 DIAGNOSIS — I69398 Other sequelae of cerebral infarction: Secondary | ICD-10-CM | POA: Diagnosis not present

## 2018-06-25 DIAGNOSIS — Z7901 Long term (current) use of anticoagulants: Secondary | ICD-10-CM | POA: Diagnosis not present

## 2018-06-25 DIAGNOSIS — E46 Unspecified protein-calorie malnutrition: Secondary | ICD-10-CM | POA: Diagnosis not present

## 2018-06-25 DIAGNOSIS — S41102D Unspecified open wound of left upper arm, subsequent encounter: Secondary | ICD-10-CM | POA: Diagnosis not present

## 2018-06-25 DIAGNOSIS — L89153 Pressure ulcer of sacral region, stage 3: Secondary | ICD-10-CM | POA: Diagnosis not present

## 2018-06-28 DIAGNOSIS — Z7409 Other reduced mobility: Secondary | ICD-10-CM | POA: Diagnosis not present

## 2018-06-28 DIAGNOSIS — I10 Essential (primary) hypertension: Secondary | ICD-10-CM | POA: Diagnosis not present

## 2018-06-28 DIAGNOSIS — E46 Unspecified protein-calorie malnutrition: Secondary | ICD-10-CM | POA: Diagnosis not present

## 2018-06-28 DIAGNOSIS — R413 Other amnesia: Secondary | ICD-10-CM | POA: Diagnosis not present

## 2018-06-28 DIAGNOSIS — R1310 Dysphagia, unspecified: Secondary | ICD-10-CM | POA: Diagnosis not present

## 2018-06-28 DIAGNOSIS — S41102D Unspecified open wound of left upper arm, subsequent encounter: Secondary | ICD-10-CM | POA: Diagnosis not present

## 2018-06-28 DIAGNOSIS — Z7901 Long term (current) use of anticoagulants: Secondary | ICD-10-CM | POA: Diagnosis not present

## 2018-06-28 DIAGNOSIS — I69398 Other sequelae of cerebral infarction: Secondary | ICD-10-CM | POA: Diagnosis not present

## 2018-06-28 DIAGNOSIS — Z4800 Encounter for change or removal of nonsurgical wound dressing: Secondary | ICD-10-CM | POA: Diagnosis not present

## 2018-06-28 DIAGNOSIS — Z87891 Personal history of nicotine dependence: Secondary | ICD-10-CM | POA: Diagnosis not present

## 2018-06-28 DIAGNOSIS — R569 Unspecified convulsions: Secondary | ICD-10-CM | POA: Diagnosis not present

## 2018-06-28 DIAGNOSIS — E119 Type 2 diabetes mellitus without complications: Secondary | ICD-10-CM | POA: Diagnosis not present

## 2018-06-28 DIAGNOSIS — I82403 Acute embolism and thrombosis of unspecified deep veins of lower extremity, bilateral: Secondary | ICD-10-CM | POA: Diagnosis not present

## 2018-06-28 DIAGNOSIS — I69391 Dysphagia following cerebral infarction: Secondary | ICD-10-CM | POA: Diagnosis not present

## 2018-06-28 DIAGNOSIS — I69319 Unspecified symptoms and signs involving cognitive functions following cerebral infarction: Secondary | ICD-10-CM | POA: Diagnosis not present

## 2018-06-28 DIAGNOSIS — D62 Acute posthemorrhagic anemia: Secondary | ICD-10-CM | POA: Diagnosis not present

## 2018-06-28 DIAGNOSIS — L89153 Pressure ulcer of sacral region, stage 3: Secondary | ICD-10-CM | POA: Diagnosis not present

## 2018-06-28 DIAGNOSIS — D72820 Lymphocytosis (symptomatic): Secondary | ICD-10-CM | POA: Diagnosis not present

## 2018-06-28 DIAGNOSIS — K529 Noninfective gastroenteritis and colitis, unspecified: Secondary | ICD-10-CM | POA: Diagnosis not present

## 2018-06-28 DIAGNOSIS — D72819 Decreased white blood cell count, unspecified: Secondary | ICD-10-CM | POA: Diagnosis not present

## 2018-06-30 ENCOUNTER — Other Ambulatory Visit: Payer: Self-pay | Admitting: Registered Nurse

## 2018-06-30 DIAGNOSIS — Z87891 Personal history of nicotine dependence: Secondary | ICD-10-CM | POA: Diagnosis not present

## 2018-06-30 DIAGNOSIS — E119 Type 2 diabetes mellitus without complications: Secondary | ICD-10-CM | POA: Diagnosis not present

## 2018-06-30 DIAGNOSIS — D62 Acute posthemorrhagic anemia: Secondary | ICD-10-CM | POA: Diagnosis not present

## 2018-06-30 DIAGNOSIS — I639 Cerebral infarction, unspecified: Secondary | ICD-10-CM | POA: Diagnosis not present

## 2018-06-30 DIAGNOSIS — R413 Other amnesia: Secondary | ICD-10-CM | POA: Diagnosis not present

## 2018-06-30 DIAGNOSIS — R1310 Dysphagia, unspecified: Secondary | ICD-10-CM | POA: Diagnosis not present

## 2018-06-30 DIAGNOSIS — R569 Unspecified convulsions: Secondary | ICD-10-CM | POA: Diagnosis not present

## 2018-06-30 DIAGNOSIS — D72820 Lymphocytosis (symptomatic): Secondary | ICD-10-CM | POA: Diagnosis not present

## 2018-06-30 DIAGNOSIS — Z7901 Long term (current) use of anticoagulants: Secondary | ICD-10-CM | POA: Diagnosis not present

## 2018-06-30 DIAGNOSIS — L89153 Pressure ulcer of sacral region, stage 3: Secondary | ICD-10-CM | POA: Diagnosis not present

## 2018-06-30 DIAGNOSIS — Z4800 Encounter for change or removal of nonsurgical wound dressing: Secondary | ICD-10-CM | POA: Diagnosis not present

## 2018-06-30 DIAGNOSIS — I69398 Other sequelae of cerebral infarction: Secondary | ICD-10-CM | POA: Diagnosis not present

## 2018-06-30 DIAGNOSIS — K529 Noninfective gastroenteritis and colitis, unspecified: Secondary | ICD-10-CM | POA: Diagnosis not present

## 2018-06-30 DIAGNOSIS — I10 Essential (primary) hypertension: Secondary | ICD-10-CM | POA: Diagnosis not present

## 2018-06-30 DIAGNOSIS — Z7409 Other reduced mobility: Secondary | ICD-10-CM | POA: Diagnosis not present

## 2018-06-30 DIAGNOSIS — I82403 Acute embolism and thrombosis of unspecified deep veins of lower extremity, bilateral: Secondary | ICD-10-CM | POA: Diagnosis not present

## 2018-06-30 DIAGNOSIS — E46 Unspecified protein-calorie malnutrition: Secondary | ICD-10-CM | POA: Diagnosis not present

## 2018-06-30 DIAGNOSIS — L89154 Pressure ulcer of sacral region, stage 4: Secondary | ICD-10-CM | POA: Diagnosis not present

## 2018-06-30 DIAGNOSIS — S41102D Unspecified open wound of left upper arm, subsequent encounter: Secondary | ICD-10-CM | POA: Diagnosis not present

## 2018-06-30 DIAGNOSIS — I69319 Unspecified symptoms and signs involving cognitive functions following cerebral infarction: Secondary | ICD-10-CM | POA: Diagnosis not present

## 2018-06-30 DIAGNOSIS — I69391 Dysphagia following cerebral infarction: Secondary | ICD-10-CM | POA: Diagnosis not present

## 2018-06-30 DIAGNOSIS — T8189XA Other complications of procedures, not elsewhere classified, initial encounter: Secondary | ICD-10-CM | POA: Diagnosis not present

## 2018-06-30 DIAGNOSIS — D72819 Decreased white blood cell count, unspecified: Secondary | ICD-10-CM | POA: Diagnosis not present

## 2018-07-02 DIAGNOSIS — Z86718 Personal history of other venous thrombosis and embolism: Secondary | ICD-10-CM | POA: Diagnosis not present

## 2018-07-02 DIAGNOSIS — I10 Essential (primary) hypertension: Secondary | ICD-10-CM | POA: Diagnosis not present

## 2018-07-02 DIAGNOSIS — E119 Type 2 diabetes mellitus without complications: Secondary | ICD-10-CM | POA: Diagnosis not present

## 2018-07-02 DIAGNOSIS — I639 Cerebral infarction, unspecified: Secondary | ICD-10-CM | POA: Diagnosis not present

## 2018-07-02 DIAGNOSIS — L89154 Pressure ulcer of sacral region, stage 4: Secondary | ICD-10-CM | POA: Diagnosis not present

## 2018-07-02 DIAGNOSIS — T8189XA Other complications of procedures, not elsewhere classified, initial encounter: Secondary | ICD-10-CM | POA: Diagnosis not present

## 2018-07-05 DIAGNOSIS — L89153 Pressure ulcer of sacral region, stage 3: Secondary | ICD-10-CM | POA: Diagnosis not present

## 2018-07-05 DIAGNOSIS — R569 Unspecified convulsions: Secondary | ICD-10-CM | POA: Diagnosis not present

## 2018-07-05 DIAGNOSIS — D72820 Lymphocytosis (symptomatic): Secondary | ICD-10-CM | POA: Diagnosis not present

## 2018-07-05 DIAGNOSIS — E119 Type 2 diabetes mellitus without complications: Secondary | ICD-10-CM | POA: Diagnosis not present

## 2018-07-05 DIAGNOSIS — I69398 Other sequelae of cerebral infarction: Secondary | ICD-10-CM | POA: Diagnosis not present

## 2018-07-05 DIAGNOSIS — D62 Acute posthemorrhagic anemia: Secondary | ICD-10-CM | POA: Diagnosis not present

## 2018-07-05 DIAGNOSIS — I69391 Dysphagia following cerebral infarction: Secondary | ICD-10-CM | POA: Diagnosis not present

## 2018-07-05 DIAGNOSIS — Z7409 Other reduced mobility: Secondary | ICD-10-CM | POA: Diagnosis not present

## 2018-07-05 DIAGNOSIS — I10 Essential (primary) hypertension: Secondary | ICD-10-CM | POA: Diagnosis not present

## 2018-07-05 DIAGNOSIS — Z7901 Long term (current) use of anticoagulants: Secondary | ICD-10-CM | POA: Diagnosis not present

## 2018-07-05 DIAGNOSIS — K529 Noninfective gastroenteritis and colitis, unspecified: Secondary | ICD-10-CM | POA: Diagnosis not present

## 2018-07-05 DIAGNOSIS — E46 Unspecified protein-calorie malnutrition: Secondary | ICD-10-CM | POA: Diagnosis not present

## 2018-07-05 DIAGNOSIS — Z4800 Encounter for change or removal of nonsurgical wound dressing: Secondary | ICD-10-CM | POA: Diagnosis not present

## 2018-07-05 DIAGNOSIS — I69319 Unspecified symptoms and signs involving cognitive functions following cerebral infarction: Secondary | ICD-10-CM | POA: Diagnosis not present

## 2018-07-05 DIAGNOSIS — R1310 Dysphagia, unspecified: Secondary | ICD-10-CM | POA: Diagnosis not present

## 2018-07-05 DIAGNOSIS — D72819 Decreased white blood cell count, unspecified: Secondary | ICD-10-CM | POA: Diagnosis not present

## 2018-07-05 DIAGNOSIS — R413 Other amnesia: Secondary | ICD-10-CM | POA: Diagnosis not present

## 2018-07-05 DIAGNOSIS — Z87891 Personal history of nicotine dependence: Secondary | ICD-10-CM | POA: Diagnosis not present

## 2018-07-05 DIAGNOSIS — I82403 Acute embolism and thrombosis of unspecified deep veins of lower extremity, bilateral: Secondary | ICD-10-CM | POA: Diagnosis not present

## 2018-07-05 DIAGNOSIS — S41102D Unspecified open wound of left upper arm, subsequent encounter: Secondary | ICD-10-CM | POA: Diagnosis not present

## 2018-07-06 ENCOUNTER — Other Ambulatory Visit: Payer: Self-pay | Admitting: Registered Nurse

## 2018-07-07 DIAGNOSIS — Z7409 Other reduced mobility: Secondary | ICD-10-CM | POA: Diagnosis not present

## 2018-07-07 DIAGNOSIS — I69319 Unspecified symptoms and signs involving cognitive functions following cerebral infarction: Secondary | ICD-10-CM | POA: Diagnosis not present

## 2018-07-07 DIAGNOSIS — R413 Other amnesia: Secondary | ICD-10-CM | POA: Diagnosis not present

## 2018-07-07 DIAGNOSIS — E119 Type 2 diabetes mellitus without complications: Secondary | ICD-10-CM | POA: Diagnosis not present

## 2018-07-07 DIAGNOSIS — K529 Noninfective gastroenteritis and colitis, unspecified: Secondary | ICD-10-CM | POA: Diagnosis not present

## 2018-07-07 DIAGNOSIS — I69391 Dysphagia following cerebral infarction: Secondary | ICD-10-CM | POA: Diagnosis not present

## 2018-07-07 DIAGNOSIS — R1310 Dysphagia, unspecified: Secondary | ICD-10-CM | POA: Diagnosis not present

## 2018-07-07 DIAGNOSIS — E46 Unspecified protein-calorie malnutrition: Secondary | ICD-10-CM | POA: Diagnosis not present

## 2018-07-07 DIAGNOSIS — I82403 Acute embolism and thrombosis of unspecified deep veins of lower extremity, bilateral: Secondary | ICD-10-CM | POA: Diagnosis not present

## 2018-07-07 DIAGNOSIS — D72819 Decreased white blood cell count, unspecified: Secondary | ICD-10-CM | POA: Diagnosis not present

## 2018-07-07 DIAGNOSIS — Z87891 Personal history of nicotine dependence: Secondary | ICD-10-CM | POA: Diagnosis not present

## 2018-07-07 DIAGNOSIS — Z4800 Encounter for change or removal of nonsurgical wound dressing: Secondary | ICD-10-CM | POA: Diagnosis not present

## 2018-07-07 DIAGNOSIS — Z7901 Long term (current) use of anticoagulants: Secondary | ICD-10-CM | POA: Diagnosis not present

## 2018-07-07 DIAGNOSIS — L89153 Pressure ulcer of sacral region, stage 3: Secondary | ICD-10-CM | POA: Diagnosis not present

## 2018-07-07 DIAGNOSIS — S41102D Unspecified open wound of left upper arm, subsequent encounter: Secondary | ICD-10-CM | POA: Diagnosis not present

## 2018-07-07 DIAGNOSIS — R569 Unspecified convulsions: Secondary | ICD-10-CM | POA: Diagnosis not present

## 2018-07-07 DIAGNOSIS — D72820 Lymphocytosis (symptomatic): Secondary | ICD-10-CM | POA: Diagnosis not present

## 2018-07-07 DIAGNOSIS — D62 Acute posthemorrhagic anemia: Secondary | ICD-10-CM | POA: Diagnosis not present

## 2018-07-07 DIAGNOSIS — I69398 Other sequelae of cerebral infarction: Secondary | ICD-10-CM | POA: Diagnosis not present

## 2018-07-07 DIAGNOSIS — I10 Essential (primary) hypertension: Secondary | ICD-10-CM | POA: Diagnosis not present

## 2018-07-09 DIAGNOSIS — I69319 Unspecified symptoms and signs involving cognitive functions following cerebral infarction: Secondary | ICD-10-CM | POA: Diagnosis not present

## 2018-07-09 DIAGNOSIS — I69398 Other sequelae of cerebral infarction: Secondary | ICD-10-CM | POA: Diagnosis not present

## 2018-07-09 DIAGNOSIS — I10 Essential (primary) hypertension: Secondary | ICD-10-CM | POA: Diagnosis not present

## 2018-07-09 DIAGNOSIS — Z7901 Long term (current) use of anticoagulants: Secondary | ICD-10-CM | POA: Diagnosis not present

## 2018-07-09 DIAGNOSIS — E119 Type 2 diabetes mellitus without complications: Secondary | ICD-10-CM | POA: Diagnosis not present

## 2018-07-09 DIAGNOSIS — R569 Unspecified convulsions: Secondary | ICD-10-CM | POA: Diagnosis not present

## 2018-07-09 DIAGNOSIS — Z7409 Other reduced mobility: Secondary | ICD-10-CM | POA: Diagnosis not present

## 2018-07-09 DIAGNOSIS — L89153 Pressure ulcer of sacral region, stage 3: Secondary | ICD-10-CM | POA: Diagnosis not present

## 2018-07-09 DIAGNOSIS — Z4800 Encounter for change or removal of nonsurgical wound dressing: Secondary | ICD-10-CM | POA: Diagnosis not present

## 2018-07-09 DIAGNOSIS — I639 Cerebral infarction, unspecified: Secondary | ICD-10-CM | POA: Diagnosis not present

## 2018-07-09 DIAGNOSIS — S41102D Unspecified open wound of left upper arm, subsequent encounter: Secondary | ICD-10-CM | POA: Diagnosis not present

## 2018-07-09 DIAGNOSIS — E46 Unspecified protein-calorie malnutrition: Secondary | ICD-10-CM | POA: Diagnosis not present

## 2018-07-09 DIAGNOSIS — K529 Noninfective gastroenteritis and colitis, unspecified: Secondary | ICD-10-CM | POA: Diagnosis not present

## 2018-07-09 DIAGNOSIS — D72820 Lymphocytosis (symptomatic): Secondary | ICD-10-CM | POA: Diagnosis not present

## 2018-07-09 DIAGNOSIS — R413 Other amnesia: Secondary | ICD-10-CM | POA: Diagnosis not present

## 2018-07-09 DIAGNOSIS — I82403 Acute embolism and thrombosis of unspecified deep veins of lower extremity, bilateral: Secondary | ICD-10-CM | POA: Diagnosis not present

## 2018-07-09 DIAGNOSIS — R1310 Dysphagia, unspecified: Secondary | ICD-10-CM | POA: Diagnosis not present

## 2018-07-09 DIAGNOSIS — D62 Acute posthemorrhagic anemia: Secondary | ICD-10-CM | POA: Diagnosis not present

## 2018-07-09 DIAGNOSIS — Z87891 Personal history of nicotine dependence: Secondary | ICD-10-CM | POA: Diagnosis not present

## 2018-07-09 DIAGNOSIS — I69391 Dysphagia following cerebral infarction: Secondary | ICD-10-CM | POA: Diagnosis not present

## 2018-07-09 DIAGNOSIS — D72819 Decreased white blood cell count, unspecified: Secondary | ICD-10-CM | POA: Diagnosis not present

## 2018-07-12 DIAGNOSIS — E119 Type 2 diabetes mellitus without complications: Secondary | ICD-10-CM | POA: Diagnosis not present

## 2018-07-12 DIAGNOSIS — I69319 Unspecified symptoms and signs involving cognitive functions following cerebral infarction: Secondary | ICD-10-CM | POA: Diagnosis not present

## 2018-07-12 DIAGNOSIS — D72819 Decreased white blood cell count, unspecified: Secondary | ICD-10-CM | POA: Diagnosis not present

## 2018-07-12 DIAGNOSIS — R413 Other amnesia: Secondary | ICD-10-CM | POA: Diagnosis not present

## 2018-07-12 DIAGNOSIS — E46 Unspecified protein-calorie malnutrition: Secondary | ICD-10-CM | POA: Diagnosis not present

## 2018-07-12 DIAGNOSIS — S41102D Unspecified open wound of left upper arm, subsequent encounter: Secondary | ICD-10-CM | POA: Diagnosis not present

## 2018-07-12 DIAGNOSIS — Z7901 Long term (current) use of anticoagulants: Secondary | ICD-10-CM | POA: Diagnosis not present

## 2018-07-12 DIAGNOSIS — I69398 Other sequelae of cerebral infarction: Secondary | ICD-10-CM | POA: Diagnosis not present

## 2018-07-12 DIAGNOSIS — I82403 Acute embolism and thrombosis of unspecified deep veins of lower extremity, bilateral: Secondary | ICD-10-CM | POA: Diagnosis not present

## 2018-07-12 DIAGNOSIS — D72820 Lymphocytosis (symptomatic): Secondary | ICD-10-CM | POA: Diagnosis not present

## 2018-07-12 DIAGNOSIS — I69391 Dysphagia following cerebral infarction: Secondary | ICD-10-CM | POA: Diagnosis not present

## 2018-07-12 DIAGNOSIS — Z87891 Personal history of nicotine dependence: Secondary | ICD-10-CM | POA: Diagnosis not present

## 2018-07-12 DIAGNOSIS — R569 Unspecified convulsions: Secondary | ICD-10-CM | POA: Diagnosis not present

## 2018-07-12 DIAGNOSIS — I10 Essential (primary) hypertension: Secondary | ICD-10-CM | POA: Diagnosis not present

## 2018-07-12 DIAGNOSIS — D62 Acute posthemorrhagic anemia: Secondary | ICD-10-CM | POA: Diagnosis not present

## 2018-07-12 DIAGNOSIS — Z4800 Encounter for change or removal of nonsurgical wound dressing: Secondary | ICD-10-CM | POA: Diagnosis not present

## 2018-07-12 DIAGNOSIS — K529 Noninfective gastroenteritis and colitis, unspecified: Secondary | ICD-10-CM | POA: Diagnosis not present

## 2018-07-12 DIAGNOSIS — Z7409 Other reduced mobility: Secondary | ICD-10-CM | POA: Diagnosis not present

## 2018-07-12 DIAGNOSIS — L89153 Pressure ulcer of sacral region, stage 3: Secondary | ICD-10-CM | POA: Diagnosis not present

## 2018-07-12 DIAGNOSIS — R1310 Dysphagia, unspecified: Secondary | ICD-10-CM | POA: Diagnosis not present

## 2018-07-14 DIAGNOSIS — I10 Essential (primary) hypertension: Secondary | ICD-10-CM | POA: Diagnosis not present

## 2018-07-14 DIAGNOSIS — I82403 Acute embolism and thrombosis of unspecified deep veins of lower extremity, bilateral: Secondary | ICD-10-CM | POA: Diagnosis not present

## 2018-07-14 DIAGNOSIS — R569 Unspecified convulsions: Secondary | ICD-10-CM | POA: Diagnosis not present

## 2018-07-14 DIAGNOSIS — K529 Noninfective gastroenteritis and colitis, unspecified: Secondary | ICD-10-CM | POA: Diagnosis not present

## 2018-07-14 DIAGNOSIS — Z4800 Encounter for change or removal of nonsurgical wound dressing: Secondary | ICD-10-CM | POA: Diagnosis not present

## 2018-07-14 DIAGNOSIS — E46 Unspecified protein-calorie malnutrition: Secondary | ICD-10-CM | POA: Diagnosis not present

## 2018-07-14 DIAGNOSIS — D72819 Decreased white blood cell count, unspecified: Secondary | ICD-10-CM | POA: Diagnosis not present

## 2018-07-14 DIAGNOSIS — I69398 Other sequelae of cerebral infarction: Secondary | ICD-10-CM | POA: Diagnosis not present

## 2018-07-14 DIAGNOSIS — Z7409 Other reduced mobility: Secondary | ICD-10-CM | POA: Diagnosis not present

## 2018-07-14 DIAGNOSIS — Z87891 Personal history of nicotine dependence: Secondary | ICD-10-CM | POA: Diagnosis not present

## 2018-07-14 DIAGNOSIS — R1310 Dysphagia, unspecified: Secondary | ICD-10-CM | POA: Diagnosis not present

## 2018-07-14 DIAGNOSIS — E119 Type 2 diabetes mellitus without complications: Secondary | ICD-10-CM | POA: Diagnosis not present

## 2018-07-14 DIAGNOSIS — R413 Other amnesia: Secondary | ICD-10-CM | POA: Diagnosis not present

## 2018-07-14 DIAGNOSIS — S41102D Unspecified open wound of left upper arm, subsequent encounter: Secondary | ICD-10-CM | POA: Diagnosis not present

## 2018-07-14 DIAGNOSIS — L89153 Pressure ulcer of sacral region, stage 3: Secondary | ICD-10-CM | POA: Diagnosis not present

## 2018-07-14 DIAGNOSIS — Z7901 Long term (current) use of anticoagulants: Secondary | ICD-10-CM | POA: Diagnosis not present

## 2018-07-14 DIAGNOSIS — D72820 Lymphocytosis (symptomatic): Secondary | ICD-10-CM | POA: Diagnosis not present

## 2018-07-14 DIAGNOSIS — I69391 Dysphagia following cerebral infarction: Secondary | ICD-10-CM | POA: Diagnosis not present

## 2018-07-14 DIAGNOSIS — I69319 Unspecified symptoms and signs involving cognitive functions following cerebral infarction: Secondary | ICD-10-CM | POA: Diagnosis not present

## 2018-07-14 DIAGNOSIS — D62 Acute posthemorrhagic anemia: Secondary | ICD-10-CM | POA: Diagnosis not present

## 2018-07-16 ENCOUNTER — Encounter (HOSPITAL_BASED_OUTPATIENT_CLINIC_OR_DEPARTMENT_OTHER): Payer: Medicare Other | Attending: Internal Medicine

## 2018-07-16 DIAGNOSIS — E43 Unspecified severe protein-calorie malnutrition: Secondary | ICD-10-CM | POA: Diagnosis not present

## 2018-07-16 DIAGNOSIS — E119 Type 2 diabetes mellitus without complications: Secondary | ICD-10-CM | POA: Insufficient documentation

## 2018-07-16 DIAGNOSIS — Z86718 Personal history of other venous thrombosis and embolism: Secondary | ICD-10-CM | POA: Insufficient documentation

## 2018-07-16 DIAGNOSIS — L89154 Pressure ulcer of sacral region, stage 4: Secondary | ICD-10-CM | POA: Insufficient documentation

## 2018-07-16 DIAGNOSIS — I10 Essential (primary) hypertension: Secondary | ICD-10-CM | POA: Diagnosis not present

## 2018-07-19 DIAGNOSIS — D72819 Decreased white blood cell count, unspecified: Secondary | ICD-10-CM | POA: Diagnosis not present

## 2018-07-19 DIAGNOSIS — Z87891 Personal history of nicotine dependence: Secondary | ICD-10-CM | POA: Diagnosis not present

## 2018-07-19 DIAGNOSIS — I82403 Acute embolism and thrombosis of unspecified deep veins of lower extremity, bilateral: Secondary | ICD-10-CM | POA: Diagnosis not present

## 2018-07-19 DIAGNOSIS — R1310 Dysphagia, unspecified: Secondary | ICD-10-CM | POA: Diagnosis not present

## 2018-07-19 DIAGNOSIS — E46 Unspecified protein-calorie malnutrition: Secondary | ICD-10-CM | POA: Diagnosis not present

## 2018-07-19 DIAGNOSIS — D62 Acute posthemorrhagic anemia: Secondary | ICD-10-CM | POA: Diagnosis not present

## 2018-07-19 DIAGNOSIS — I69398 Other sequelae of cerebral infarction: Secondary | ICD-10-CM | POA: Diagnosis not present

## 2018-07-19 DIAGNOSIS — R413 Other amnesia: Secondary | ICD-10-CM | POA: Diagnosis not present

## 2018-07-19 DIAGNOSIS — I69391 Dysphagia following cerebral infarction: Secondary | ICD-10-CM | POA: Diagnosis not present

## 2018-07-19 DIAGNOSIS — I69319 Unspecified symptoms and signs involving cognitive functions following cerebral infarction: Secondary | ICD-10-CM | POA: Diagnosis not present

## 2018-07-19 DIAGNOSIS — I10 Essential (primary) hypertension: Secondary | ICD-10-CM | POA: Diagnosis not present

## 2018-07-19 DIAGNOSIS — S41102D Unspecified open wound of left upper arm, subsequent encounter: Secondary | ICD-10-CM | POA: Diagnosis not present

## 2018-07-19 DIAGNOSIS — E119 Type 2 diabetes mellitus without complications: Secondary | ICD-10-CM | POA: Diagnosis not present

## 2018-07-19 DIAGNOSIS — Z7409 Other reduced mobility: Secondary | ICD-10-CM | POA: Diagnosis not present

## 2018-07-19 DIAGNOSIS — D72820 Lymphocytosis (symptomatic): Secondary | ICD-10-CM | POA: Diagnosis not present

## 2018-07-19 DIAGNOSIS — Z4800 Encounter for change or removal of nonsurgical wound dressing: Secondary | ICD-10-CM | POA: Diagnosis not present

## 2018-07-19 DIAGNOSIS — R569 Unspecified convulsions: Secondary | ICD-10-CM | POA: Diagnosis not present

## 2018-07-19 DIAGNOSIS — L89153 Pressure ulcer of sacral region, stage 3: Secondary | ICD-10-CM | POA: Diagnosis not present

## 2018-07-19 DIAGNOSIS — Z7901 Long term (current) use of anticoagulants: Secondary | ICD-10-CM | POA: Diagnosis not present

## 2018-07-19 DIAGNOSIS — K529 Noninfective gastroenteritis and colitis, unspecified: Secondary | ICD-10-CM | POA: Diagnosis not present

## 2018-07-20 DIAGNOSIS — R1010 Upper abdominal pain, unspecified: Secondary | ICD-10-CM | POA: Diagnosis not present

## 2018-07-20 DIAGNOSIS — E7800 Pure hypercholesterolemia, unspecified: Secondary | ICD-10-CM | POA: Diagnosis not present

## 2018-07-20 DIAGNOSIS — I10 Essential (primary) hypertension: Secondary | ICD-10-CM | POA: Diagnosis not present

## 2018-07-20 DIAGNOSIS — Z0001 Encounter for general adult medical examination with abnormal findings: Secondary | ICD-10-CM | POA: Diagnosis not present

## 2018-07-20 DIAGNOSIS — Z8673 Personal history of transient ischemic attack (TIA), and cerebral infarction without residual deficits: Secondary | ICD-10-CM | POA: Diagnosis not present

## 2018-07-21 DIAGNOSIS — R413 Other amnesia: Secondary | ICD-10-CM | POA: Diagnosis not present

## 2018-07-21 DIAGNOSIS — K529 Noninfective gastroenteritis and colitis, unspecified: Secondary | ICD-10-CM | POA: Diagnosis not present

## 2018-07-21 DIAGNOSIS — S41102D Unspecified open wound of left upper arm, subsequent encounter: Secondary | ICD-10-CM | POA: Diagnosis not present

## 2018-07-21 DIAGNOSIS — D72820 Lymphocytosis (symptomatic): Secondary | ICD-10-CM | POA: Diagnosis not present

## 2018-07-21 DIAGNOSIS — E46 Unspecified protein-calorie malnutrition: Secondary | ICD-10-CM | POA: Diagnosis not present

## 2018-07-21 DIAGNOSIS — I82403 Acute embolism and thrombosis of unspecified deep veins of lower extremity, bilateral: Secondary | ICD-10-CM | POA: Diagnosis not present

## 2018-07-21 DIAGNOSIS — I69398 Other sequelae of cerebral infarction: Secondary | ICD-10-CM | POA: Diagnosis not present

## 2018-07-21 DIAGNOSIS — R569 Unspecified convulsions: Secondary | ICD-10-CM | POA: Diagnosis not present

## 2018-07-21 DIAGNOSIS — L89153 Pressure ulcer of sacral region, stage 3: Secondary | ICD-10-CM | POA: Diagnosis not present

## 2018-07-21 DIAGNOSIS — Z87891 Personal history of nicotine dependence: Secondary | ICD-10-CM | POA: Diagnosis not present

## 2018-07-21 DIAGNOSIS — Z7901 Long term (current) use of anticoagulants: Secondary | ICD-10-CM | POA: Diagnosis not present

## 2018-07-21 DIAGNOSIS — D72819 Decreased white blood cell count, unspecified: Secondary | ICD-10-CM | POA: Diagnosis not present

## 2018-07-21 DIAGNOSIS — E119 Type 2 diabetes mellitus without complications: Secondary | ICD-10-CM | POA: Diagnosis not present

## 2018-07-21 DIAGNOSIS — R1310 Dysphagia, unspecified: Secondary | ICD-10-CM | POA: Diagnosis not present

## 2018-07-21 DIAGNOSIS — I10 Essential (primary) hypertension: Secondary | ICD-10-CM | POA: Diagnosis not present

## 2018-07-21 DIAGNOSIS — Z7409 Other reduced mobility: Secondary | ICD-10-CM | POA: Diagnosis not present

## 2018-07-21 DIAGNOSIS — Z4800 Encounter for change or removal of nonsurgical wound dressing: Secondary | ICD-10-CM | POA: Diagnosis not present

## 2018-07-21 DIAGNOSIS — I69391 Dysphagia following cerebral infarction: Secondary | ICD-10-CM | POA: Diagnosis not present

## 2018-07-21 DIAGNOSIS — I69319 Unspecified symptoms and signs involving cognitive functions following cerebral infarction: Secondary | ICD-10-CM | POA: Diagnosis not present

## 2018-07-21 DIAGNOSIS — D62 Acute posthemorrhagic anemia: Secondary | ICD-10-CM | POA: Diagnosis not present

## 2018-07-22 DIAGNOSIS — R1010 Upper abdominal pain, unspecified: Secondary | ICD-10-CM | POA: Diagnosis not present

## 2018-07-22 DIAGNOSIS — K8020 Calculus of gallbladder without cholecystitis without obstruction: Secondary | ICD-10-CM | POA: Diagnosis not present

## 2018-07-23 DIAGNOSIS — R1010 Upper abdominal pain, unspecified: Secondary | ICD-10-CM | POA: Diagnosis not present

## 2018-07-23 DIAGNOSIS — K2970 Gastritis, unspecified, without bleeding: Secondary | ICD-10-CM | POA: Diagnosis not present

## 2018-07-24 ENCOUNTER — Other Ambulatory Visit: Payer: Self-pay | Admitting: Registered Nurse

## 2018-07-26 DIAGNOSIS — D62 Acute posthemorrhagic anemia: Secondary | ICD-10-CM | POA: Diagnosis not present

## 2018-07-26 DIAGNOSIS — S41102D Unspecified open wound of left upper arm, subsequent encounter: Secondary | ICD-10-CM | POA: Diagnosis not present

## 2018-07-26 DIAGNOSIS — D72819 Decreased white blood cell count, unspecified: Secondary | ICD-10-CM | POA: Diagnosis not present

## 2018-07-26 DIAGNOSIS — I69398 Other sequelae of cerebral infarction: Secondary | ICD-10-CM | POA: Diagnosis not present

## 2018-07-26 DIAGNOSIS — I69391 Dysphagia following cerebral infarction: Secondary | ICD-10-CM | POA: Diagnosis not present

## 2018-07-26 DIAGNOSIS — E119 Type 2 diabetes mellitus without complications: Secondary | ICD-10-CM | POA: Diagnosis not present

## 2018-07-26 DIAGNOSIS — I82403 Acute embolism and thrombosis of unspecified deep veins of lower extremity, bilateral: Secondary | ICD-10-CM | POA: Diagnosis not present

## 2018-07-26 DIAGNOSIS — Z87891 Personal history of nicotine dependence: Secondary | ICD-10-CM | POA: Diagnosis not present

## 2018-07-26 DIAGNOSIS — R413 Other amnesia: Secondary | ICD-10-CM | POA: Diagnosis not present

## 2018-07-26 DIAGNOSIS — I69319 Unspecified symptoms and signs involving cognitive functions following cerebral infarction: Secondary | ICD-10-CM | POA: Diagnosis not present

## 2018-07-26 DIAGNOSIS — I10 Essential (primary) hypertension: Secondary | ICD-10-CM | POA: Diagnosis not present

## 2018-07-26 DIAGNOSIS — R569 Unspecified convulsions: Secondary | ICD-10-CM | POA: Diagnosis not present

## 2018-07-26 DIAGNOSIS — Z7901 Long term (current) use of anticoagulants: Secondary | ICD-10-CM | POA: Diagnosis not present

## 2018-07-26 DIAGNOSIS — Z7409 Other reduced mobility: Secondary | ICD-10-CM | POA: Diagnosis not present

## 2018-07-26 DIAGNOSIS — Z4800 Encounter for change or removal of nonsurgical wound dressing: Secondary | ICD-10-CM | POA: Diagnosis not present

## 2018-07-26 DIAGNOSIS — K529 Noninfective gastroenteritis and colitis, unspecified: Secondary | ICD-10-CM | POA: Diagnosis not present

## 2018-07-26 DIAGNOSIS — D72820 Lymphocytosis (symptomatic): Secondary | ICD-10-CM | POA: Diagnosis not present

## 2018-07-26 DIAGNOSIS — R1310 Dysphagia, unspecified: Secondary | ICD-10-CM | POA: Diagnosis not present

## 2018-07-26 DIAGNOSIS — E46 Unspecified protein-calorie malnutrition: Secondary | ICD-10-CM | POA: Diagnosis not present

## 2018-07-26 DIAGNOSIS — L89153 Pressure ulcer of sacral region, stage 3: Secondary | ICD-10-CM | POA: Diagnosis not present

## 2018-07-27 ENCOUNTER — Other Ambulatory Visit: Payer: Self-pay | Admitting: Registered Nurse

## 2018-07-27 DIAGNOSIS — I69319 Unspecified symptoms and signs involving cognitive functions following cerebral infarction: Secondary | ICD-10-CM | POA: Diagnosis not present

## 2018-07-27 DIAGNOSIS — Z4800 Encounter for change or removal of nonsurgical wound dressing: Secondary | ICD-10-CM | POA: Diagnosis not present

## 2018-07-27 DIAGNOSIS — K529 Noninfective gastroenteritis and colitis, unspecified: Secondary | ICD-10-CM | POA: Diagnosis not present

## 2018-07-27 DIAGNOSIS — R569 Unspecified convulsions: Secondary | ICD-10-CM | POA: Diagnosis not present

## 2018-07-27 DIAGNOSIS — R1310 Dysphagia, unspecified: Secondary | ICD-10-CM | POA: Diagnosis not present

## 2018-07-27 DIAGNOSIS — D72819 Decreased white blood cell count, unspecified: Secondary | ICD-10-CM | POA: Diagnosis not present

## 2018-07-27 DIAGNOSIS — E46 Unspecified protein-calorie malnutrition: Secondary | ICD-10-CM | POA: Diagnosis not present

## 2018-07-27 DIAGNOSIS — I82403 Acute embolism and thrombosis of unspecified deep veins of lower extremity, bilateral: Secondary | ICD-10-CM | POA: Diagnosis not present

## 2018-07-27 DIAGNOSIS — I69398 Other sequelae of cerebral infarction: Secondary | ICD-10-CM | POA: Diagnosis not present

## 2018-07-27 DIAGNOSIS — I10 Essential (primary) hypertension: Secondary | ICD-10-CM | POA: Diagnosis not present

## 2018-07-27 DIAGNOSIS — I69391 Dysphagia following cerebral infarction: Secondary | ICD-10-CM | POA: Diagnosis not present

## 2018-07-27 DIAGNOSIS — E119 Type 2 diabetes mellitus without complications: Secondary | ICD-10-CM | POA: Diagnosis not present

## 2018-07-27 DIAGNOSIS — L89153 Pressure ulcer of sacral region, stage 3: Secondary | ICD-10-CM | POA: Diagnosis not present

## 2018-07-27 DIAGNOSIS — D72820 Lymphocytosis (symptomatic): Secondary | ICD-10-CM | POA: Diagnosis not present

## 2018-07-27 DIAGNOSIS — R413 Other amnesia: Secondary | ICD-10-CM | POA: Diagnosis not present

## 2018-07-27 DIAGNOSIS — D62 Acute posthemorrhagic anemia: Secondary | ICD-10-CM | POA: Diagnosis not present

## 2018-07-27 DIAGNOSIS — Z7901 Long term (current) use of anticoagulants: Secondary | ICD-10-CM | POA: Diagnosis not present

## 2018-07-27 DIAGNOSIS — Z7409 Other reduced mobility: Secondary | ICD-10-CM | POA: Diagnosis not present

## 2018-07-27 DIAGNOSIS — Z87891 Personal history of nicotine dependence: Secondary | ICD-10-CM | POA: Diagnosis not present

## 2018-07-27 DIAGNOSIS — S41102D Unspecified open wound of left upper arm, subsequent encounter: Secondary | ICD-10-CM | POA: Diagnosis not present

## 2018-07-29 ENCOUNTER — Telehealth: Payer: Self-pay | Admitting: *Deleted

## 2018-07-29 DIAGNOSIS — I69398 Other sequelae of cerebral infarction: Secondary | ICD-10-CM | POA: Diagnosis not present

## 2018-07-29 DIAGNOSIS — L89153 Pressure ulcer of sacral region, stage 3: Secondary | ICD-10-CM | POA: Diagnosis not present

## 2018-07-29 DIAGNOSIS — I69391 Dysphagia following cerebral infarction: Secondary | ICD-10-CM | POA: Diagnosis not present

## 2018-07-29 DIAGNOSIS — D72819 Decreased white blood cell count, unspecified: Secondary | ICD-10-CM | POA: Diagnosis not present

## 2018-07-29 DIAGNOSIS — E119 Type 2 diabetes mellitus without complications: Secondary | ICD-10-CM | POA: Diagnosis not present

## 2018-07-29 DIAGNOSIS — Z4800 Encounter for change or removal of nonsurgical wound dressing: Secondary | ICD-10-CM | POA: Diagnosis not present

## 2018-07-29 DIAGNOSIS — K529 Noninfective gastroenteritis and colitis, unspecified: Secondary | ICD-10-CM | POA: Diagnosis not present

## 2018-07-29 DIAGNOSIS — R413 Other amnesia: Secondary | ICD-10-CM | POA: Diagnosis not present

## 2018-07-29 DIAGNOSIS — I82403 Acute embolism and thrombosis of unspecified deep veins of lower extremity, bilateral: Secondary | ICD-10-CM | POA: Diagnosis not present

## 2018-07-29 DIAGNOSIS — Z7409 Other reduced mobility: Secondary | ICD-10-CM | POA: Diagnosis not present

## 2018-07-29 DIAGNOSIS — I10 Essential (primary) hypertension: Secondary | ICD-10-CM | POA: Diagnosis not present

## 2018-07-29 DIAGNOSIS — S41102D Unspecified open wound of left upper arm, subsequent encounter: Secondary | ICD-10-CM | POA: Diagnosis not present

## 2018-07-29 DIAGNOSIS — R1310 Dysphagia, unspecified: Secondary | ICD-10-CM | POA: Diagnosis not present

## 2018-07-29 DIAGNOSIS — D62 Acute posthemorrhagic anemia: Secondary | ICD-10-CM | POA: Diagnosis not present

## 2018-07-29 DIAGNOSIS — E46 Unspecified protein-calorie malnutrition: Secondary | ICD-10-CM | POA: Diagnosis not present

## 2018-07-29 DIAGNOSIS — R569 Unspecified convulsions: Secondary | ICD-10-CM | POA: Diagnosis not present

## 2018-07-29 DIAGNOSIS — Z87891 Personal history of nicotine dependence: Secondary | ICD-10-CM | POA: Diagnosis not present

## 2018-07-29 DIAGNOSIS — Z7901 Long term (current) use of anticoagulants: Secondary | ICD-10-CM | POA: Diagnosis not present

## 2018-07-29 DIAGNOSIS — I69319 Unspecified symptoms and signs involving cognitive functions following cerebral infarction: Secondary | ICD-10-CM | POA: Diagnosis not present

## 2018-07-29 DIAGNOSIS — D72820 Lymphocytosis (symptomatic): Secondary | ICD-10-CM | POA: Diagnosis not present

## 2018-07-29 NOTE — Telephone Encounter (Signed)
[facility] called to report patient cancelled her PT this week due to other appointments and family coming over the weekend.

## 2018-07-30 DIAGNOSIS — E119 Type 2 diabetes mellitus without complications: Secondary | ICD-10-CM | POA: Diagnosis not present

## 2018-07-30 DIAGNOSIS — Z86718 Personal history of other venous thrombosis and embolism: Secondary | ICD-10-CM | POA: Diagnosis not present

## 2018-07-30 DIAGNOSIS — L89154 Pressure ulcer of sacral region, stage 4: Secondary | ICD-10-CM | POA: Diagnosis not present

## 2018-07-30 DIAGNOSIS — E43 Unspecified severe protein-calorie malnutrition: Secondary | ICD-10-CM | POA: Diagnosis not present

## 2018-07-30 DIAGNOSIS — I10 Essential (primary) hypertension: Secondary | ICD-10-CM | POA: Diagnosis not present

## 2018-07-31 ENCOUNTER — Other Ambulatory Visit: Payer: Self-pay | Admitting: Registered Nurse

## 2018-07-31 DIAGNOSIS — T8189XA Other complications of procedures, not elsewhere classified, initial encounter: Secondary | ICD-10-CM | POA: Diagnosis not present

## 2018-07-31 DIAGNOSIS — L89154 Pressure ulcer of sacral region, stage 4: Secondary | ICD-10-CM | POA: Diagnosis not present

## 2018-07-31 DIAGNOSIS — I639 Cerebral infarction, unspecified: Secondary | ICD-10-CM | POA: Diagnosis not present

## 2018-08-03 DIAGNOSIS — K529 Noninfective gastroenteritis and colitis, unspecified: Secondary | ICD-10-CM | POA: Diagnosis not present

## 2018-08-03 DIAGNOSIS — I82403 Acute embolism and thrombosis of unspecified deep veins of lower extremity, bilateral: Secondary | ICD-10-CM | POA: Diagnosis not present

## 2018-08-03 DIAGNOSIS — Z87891 Personal history of nicotine dependence: Secondary | ICD-10-CM | POA: Diagnosis not present

## 2018-08-03 DIAGNOSIS — D72819 Decreased white blood cell count, unspecified: Secondary | ICD-10-CM | POA: Diagnosis not present

## 2018-08-03 DIAGNOSIS — E119 Type 2 diabetes mellitus without complications: Secondary | ICD-10-CM | POA: Diagnosis not present

## 2018-08-03 DIAGNOSIS — Z7901 Long term (current) use of anticoagulants: Secondary | ICD-10-CM | POA: Diagnosis not present

## 2018-08-03 DIAGNOSIS — R413 Other amnesia: Secondary | ICD-10-CM | POA: Diagnosis not present

## 2018-08-03 DIAGNOSIS — E46 Unspecified protein-calorie malnutrition: Secondary | ICD-10-CM | POA: Diagnosis not present

## 2018-08-03 DIAGNOSIS — R1310 Dysphagia, unspecified: Secondary | ICD-10-CM | POA: Diagnosis not present

## 2018-08-03 DIAGNOSIS — I69319 Unspecified symptoms and signs involving cognitive functions following cerebral infarction: Secondary | ICD-10-CM | POA: Diagnosis not present

## 2018-08-03 DIAGNOSIS — I10 Essential (primary) hypertension: Secondary | ICD-10-CM | POA: Diagnosis not present

## 2018-08-03 DIAGNOSIS — R569 Unspecified convulsions: Secondary | ICD-10-CM | POA: Diagnosis not present

## 2018-08-03 DIAGNOSIS — D72820 Lymphocytosis (symptomatic): Secondary | ICD-10-CM | POA: Diagnosis not present

## 2018-08-03 DIAGNOSIS — Z7409 Other reduced mobility: Secondary | ICD-10-CM | POA: Diagnosis not present

## 2018-08-03 DIAGNOSIS — Z4800 Encounter for change or removal of nonsurgical wound dressing: Secondary | ICD-10-CM | POA: Diagnosis not present

## 2018-08-03 DIAGNOSIS — S41102D Unspecified open wound of left upper arm, subsequent encounter: Secondary | ICD-10-CM | POA: Diagnosis not present

## 2018-08-03 DIAGNOSIS — I69398 Other sequelae of cerebral infarction: Secondary | ICD-10-CM | POA: Diagnosis not present

## 2018-08-03 DIAGNOSIS — I69391 Dysphagia following cerebral infarction: Secondary | ICD-10-CM | POA: Diagnosis not present

## 2018-08-03 DIAGNOSIS — D62 Acute posthemorrhagic anemia: Secondary | ICD-10-CM | POA: Diagnosis not present

## 2018-08-03 DIAGNOSIS — L89153 Pressure ulcer of sacral region, stage 3: Secondary | ICD-10-CM | POA: Diagnosis not present

## 2018-08-04 ENCOUNTER — Telehealth: Payer: Self-pay | Admitting: *Deleted

## 2018-08-04 DIAGNOSIS — Z4800 Encounter for change or removal of nonsurgical wound dressing: Secondary | ICD-10-CM | POA: Diagnosis not present

## 2018-08-04 DIAGNOSIS — D72820 Lymphocytosis (symptomatic): Secondary | ICD-10-CM | POA: Diagnosis not present

## 2018-08-04 DIAGNOSIS — S41102D Unspecified open wound of left upper arm, subsequent encounter: Secondary | ICD-10-CM | POA: Diagnosis not present

## 2018-08-04 DIAGNOSIS — L89153 Pressure ulcer of sacral region, stage 3: Secondary | ICD-10-CM | POA: Diagnosis not present

## 2018-08-04 DIAGNOSIS — I82403 Acute embolism and thrombosis of unspecified deep veins of lower extremity, bilateral: Secondary | ICD-10-CM | POA: Diagnosis not present

## 2018-08-04 DIAGNOSIS — I69398 Other sequelae of cerebral infarction: Secondary | ICD-10-CM | POA: Diagnosis not present

## 2018-08-04 DIAGNOSIS — R413 Other amnesia: Secondary | ICD-10-CM | POA: Diagnosis not present

## 2018-08-04 DIAGNOSIS — Z7901 Long term (current) use of anticoagulants: Secondary | ICD-10-CM | POA: Diagnosis not present

## 2018-08-04 DIAGNOSIS — D62 Acute posthemorrhagic anemia: Secondary | ICD-10-CM | POA: Diagnosis not present

## 2018-08-04 DIAGNOSIS — I10 Essential (primary) hypertension: Secondary | ICD-10-CM | POA: Diagnosis not present

## 2018-08-04 DIAGNOSIS — Z87891 Personal history of nicotine dependence: Secondary | ICD-10-CM | POA: Diagnosis not present

## 2018-08-04 DIAGNOSIS — I69319 Unspecified symptoms and signs involving cognitive functions following cerebral infarction: Secondary | ICD-10-CM | POA: Diagnosis not present

## 2018-08-04 DIAGNOSIS — E119 Type 2 diabetes mellitus without complications: Secondary | ICD-10-CM | POA: Diagnosis not present

## 2018-08-04 DIAGNOSIS — Z7409 Other reduced mobility: Secondary | ICD-10-CM | POA: Diagnosis not present

## 2018-08-04 DIAGNOSIS — R569 Unspecified convulsions: Secondary | ICD-10-CM | POA: Diagnosis not present

## 2018-08-04 DIAGNOSIS — I69391 Dysphagia following cerebral infarction: Secondary | ICD-10-CM | POA: Diagnosis not present

## 2018-08-04 DIAGNOSIS — R1310 Dysphagia, unspecified: Secondary | ICD-10-CM | POA: Diagnosis not present

## 2018-08-04 DIAGNOSIS — E46 Unspecified protein-calorie malnutrition: Secondary | ICD-10-CM | POA: Diagnosis not present

## 2018-08-04 DIAGNOSIS — K529 Noninfective gastroenteritis and colitis, unspecified: Secondary | ICD-10-CM | POA: Diagnosis not present

## 2018-08-04 DIAGNOSIS — D72819 Decreased white blood cell count, unspecified: Secondary | ICD-10-CM | POA: Diagnosis not present

## 2018-08-04 NOTE — Telephone Encounter (Signed)
Patient cancelled 2nd PT visit this week.  She wants to do only one time per week.  FYI.

## 2018-08-05 ENCOUNTER — Ambulatory Visit: Payer: Self-pay | Admitting: Physical Medicine & Rehabilitation

## 2018-08-06 ENCOUNTER — Telehealth: Payer: Self-pay

## 2018-08-06 DIAGNOSIS — D72819 Decreased white blood cell count, unspecified: Secondary | ICD-10-CM | POA: Diagnosis not present

## 2018-08-06 DIAGNOSIS — E119 Type 2 diabetes mellitus without complications: Secondary | ICD-10-CM | POA: Diagnosis not present

## 2018-08-06 DIAGNOSIS — Z7901 Long term (current) use of anticoagulants: Secondary | ICD-10-CM | POA: Diagnosis not present

## 2018-08-06 DIAGNOSIS — L89153 Pressure ulcer of sacral region, stage 3: Secondary | ICD-10-CM | POA: Diagnosis not present

## 2018-08-06 DIAGNOSIS — S41102D Unspecified open wound of left upper arm, subsequent encounter: Secondary | ICD-10-CM | POA: Diagnosis not present

## 2018-08-06 DIAGNOSIS — R1310 Dysphagia, unspecified: Secondary | ICD-10-CM | POA: Diagnosis not present

## 2018-08-06 DIAGNOSIS — D72820 Lymphocytosis (symptomatic): Secondary | ICD-10-CM | POA: Diagnosis not present

## 2018-08-06 DIAGNOSIS — Z4800 Encounter for change or removal of nonsurgical wound dressing: Secondary | ICD-10-CM | POA: Diagnosis not present

## 2018-08-06 DIAGNOSIS — I69319 Unspecified symptoms and signs involving cognitive functions following cerebral infarction: Secondary | ICD-10-CM | POA: Diagnosis not present

## 2018-08-06 DIAGNOSIS — D62 Acute posthemorrhagic anemia: Secondary | ICD-10-CM | POA: Diagnosis not present

## 2018-08-06 DIAGNOSIS — I10 Essential (primary) hypertension: Secondary | ICD-10-CM | POA: Diagnosis not present

## 2018-08-06 DIAGNOSIS — Z7409 Other reduced mobility: Secondary | ICD-10-CM | POA: Diagnosis not present

## 2018-08-06 DIAGNOSIS — Z87891 Personal history of nicotine dependence: Secondary | ICD-10-CM | POA: Diagnosis not present

## 2018-08-06 DIAGNOSIS — I69398 Other sequelae of cerebral infarction: Secondary | ICD-10-CM | POA: Diagnosis not present

## 2018-08-06 DIAGNOSIS — K529 Noninfective gastroenteritis and colitis, unspecified: Secondary | ICD-10-CM | POA: Diagnosis not present

## 2018-08-06 DIAGNOSIS — E46 Unspecified protein-calorie malnutrition: Secondary | ICD-10-CM | POA: Diagnosis not present

## 2018-08-06 DIAGNOSIS — I82403 Acute embolism and thrombosis of unspecified deep veins of lower extremity, bilateral: Secondary | ICD-10-CM | POA: Diagnosis not present

## 2018-08-06 DIAGNOSIS — I69391 Dysphagia following cerebral infarction: Secondary | ICD-10-CM | POA: Diagnosis not present

## 2018-08-06 DIAGNOSIS — R569 Unspecified convulsions: Secondary | ICD-10-CM | POA: Diagnosis not present

## 2018-08-06 DIAGNOSIS — R413 Other amnesia: Secondary | ICD-10-CM | POA: Diagnosis not present

## 2018-08-06 NOTE — Telephone Encounter (Signed)
FYI-Patient husband mentioned to Zorita Pang while he was on the phone with her about patient appt on Monday that the patient only had enough Eliquis to get her to Sunday and she takes 2 a day. In reviewing patient chart by me and Zella Ball patient saw Dr. Leonie Man on 06/15/2018 and he advised pt to continue Eliquis daily. Dr. Leonie Man is closed but a message was left with after hours to verify how many patient is suppose to take a day.  I called patient and spoke to husband. Husband informed me that patient Eliquis 5mg  one in the am and one in the pm #60 last filled at [facility] on 07/05/2018 prescriber E. Marcello Moores, last prescribed 05/13/2018 with no refills is in the chart. Spoke to pharmacist at [facility] and pharmacist informed me that patient filled Eliquis in Jan and Feb by Dr. [name] which is patient PCP. And filled 07/05/2018 prescribed by E. Marcello Moores which was a hard copy from 05/13/2018. Per Zella Ball it is okay to give pharmacist verbal Eliquis 5 mg one in the am and one in the pm #60 no refills. Verbal given to pharmacy.

## 2018-08-09 ENCOUNTER — Ambulatory Visit (HOSPITAL_BASED_OUTPATIENT_CLINIC_OR_DEPARTMENT_OTHER): Payer: Medicare Other | Admitting: Physical Medicine & Rehabilitation

## 2018-08-09 ENCOUNTER — Encounter: Payer: Medicare Other | Attending: Physical Medicine & Rehabilitation

## 2018-08-09 ENCOUNTER — Encounter: Payer: Self-pay | Admitting: Physical Medicine & Rehabilitation

## 2018-08-09 ENCOUNTER — Other Ambulatory Visit: Payer: Self-pay

## 2018-08-09 ENCOUNTER — Ambulatory Visit: Payer: Medicare Other | Admitting: Physical Medicine & Rehabilitation

## 2018-08-09 VITALS — BP 136/82 | HR 88 | Ht 62.0 in | Wt 120.0 lb

## 2018-08-09 DIAGNOSIS — I63412 Cerebral infarction due to embolism of left middle cerebral artery: Secondary | ICD-10-CM | POA: Insufficient documentation

## 2018-08-09 DIAGNOSIS — I69351 Hemiplegia and hemiparesis following cerebral infarction affecting right dominant side: Secondary | ICD-10-CM

## 2018-08-09 DIAGNOSIS — D62 Acute posthemorrhagic anemia: Secondary | ICD-10-CM | POA: Insufficient documentation

## 2018-08-09 DIAGNOSIS — I10 Essential (primary) hypertension: Secondary | ICD-10-CM | POA: Insufficient documentation

## 2018-08-09 DIAGNOSIS — E43 Unspecified severe protein-calorie malnutrition: Secondary | ICD-10-CM | POA: Insufficient documentation

## 2018-08-09 DIAGNOSIS — E876 Hypokalemia: Secondary | ICD-10-CM | POA: Insufficient documentation

## 2018-08-09 DIAGNOSIS — I824Z3 Acute embolism and thrombosis of unspecified deep veins of distal lower extremity, bilateral: Secondary | ICD-10-CM | POA: Insufficient documentation

## 2018-08-09 DIAGNOSIS — I639 Cerebral infarction, unspecified: Secondary | ICD-10-CM | POA: Diagnosis not present

## 2018-08-09 DIAGNOSIS — Z298 Encounter for other specified prophylactic measures: Secondary | ICD-10-CM | POA: Insufficient documentation

## 2018-08-09 DIAGNOSIS — L89154 Pressure ulcer of sacral region, stage 4: Secondary | ICD-10-CM | POA: Insufficient documentation

## 2018-08-09 NOTE — Progress Notes (Signed)
Subjective:    Patient ID: Maria Avila, female    DOB: 04-Aug-1950, 68 y.o.   MRN: [id number]   68 year old female with history of T2DM, HTN, heavy alcohol use, lymphocytosis/leocupenia, problems with colitis,N/V/D with 40 lbs weight loss and negative work up on 02/16/18 admission.  She was discharged to SNF for rehab but readmitted on 03/04/2018 with mental status changes, hypoxia and tachycardia due to sepsis.  She was found to have near complete opacification of the left chest due to empyema.  She was intubated and left chest tube placed with drainage of purulent material.  Dr. Roxan Hockey recommended Gastrografin swallow which was negative for perforation.  He felt that empyema was likely due to aspiration pneumonia and cultures positive for E. coli, enterococcus and pseudomonas aeruginosa  The patient also suffered a left parietal infarct during that acute care hospitalization. She was at rehab at [facility] from 03/17/2018 04/09/2018 Her last office visit with PMNR was 06/10/2018  HPI W/u with Dr Shelia Media re abd pain, 4/10 severity Still needs assist for bathing ( washing back and shower transfers) Using walker to ambulate with supervision assistance No falls at home We discussed her Eliquis.  This was started in the acute care hospital by neurology.  Upon discharge from rehab she did receive prescription from the rehab PA in the hospital.  We discussed that this needs to be prescribed on a ongoing basis by her PCP or cardiology, we did refill it x1 more month on 08/06/2018 She remains on Keppra per neurology, EEG did not show any clear-cut epileptiform activity during her hospitalization.  VIRTUAL VISIT- patient is at home  Pain Inventory Average Pain 4 Pain Right Now 4 My pain is aching  In the last 24 hours, has pain interfered with the following? General activity 0 Relation with others 0 Enjoyment of life 0 What TIME of day is your pain at its worst? night Sleep (in general) Fair  Pain is worse with: bending Pain improves with: rest and medication Relief from Meds: 2  Mobility use a walker ability to climb steps?  no do you drive?  no use a wheelchair needs help with transfers  Function I need assistance with the following:  dressing, bathing, meal prep, household duties and shopping  Neuro/Psych trouble walking  Prior Studies Any changes since last visit?  no  Physicians involved in your care Any changes since last visit?  no   Family History  Problem Relation Age of Onset  . Alzheimer's disease Mother   . Colon cancer Father    Social History   Socioeconomic History  . Marital status: Married    Spouse name: Not on file  . Number of children: Not on file  . Years of education: Not on file  . Highest education level: Not on file  Occupational History  . Not on file  Social Needs  . Financial resource strain: Not on file  . Food insecurity:    Worry: Not on file    Inability: Not on file  . Transportation needs:    Medical: Not on file    Non-medical: Not on file  Tobacco Use  . Smoking status: Former Research scientist (life sciences)  . Smokeless tobacco: Never Used  Substance and Sexual Activity  . Alcohol use: Not Currently  . Drug use: Not Currently  . Sexual activity: Not on file  Lifestyle  . Physical activity:    Days per week: Not on file    Minutes per session:  Not on file  . Stress: Not on file  Relationships  . Social connections:    Talks on phone: Not on file    Gets together: Not on file    Attends religious service: Not on file    Active member of club or organization: Not on file    Attends meetings of clubs or organizations: Not on file    Relationship status: Not on file  Other Topics Concern  . Not on file  Social History Narrative  . Not on file   Past Surgical History:  Procedure Laterality Date  . BIOPSY  02/26/2018   Procedure: BIOPSY;  Surgeon: Wilford Corner, MD;  Location: [facility];  Service: Endoscopy;;  .  COLONOSCOPY WITH PROPOFOL N/A 02/26/2018   Procedure: COLONOSCOPY WITH PROPOFOL;  Surgeon: Wilford Corner, MD;  Location: [facility];  Service: Endoscopy;  Laterality: N/A;  . ESOPHAGOGASTRODUODENOSCOPY (EGD) WITH PROPOFOL N/A 02/26/2018   Procedure: ESOPHAGOGASTRODUODENOSCOPY (EGD) WITH PROPOFOL;  Surgeon: Wilford Corner, MD;  Location: [facility];  Service: Endoscopy;  Laterality: N/A;  . NO PAST SURGERIES     UNCERTAIN OF NAME OF SURGERY   Past Medical History:  Diagnosis Date  . Colitis   . Diabetes mellitus without complication ([place])   . Hypertension   . Malnutrition ([place])    BP 136/82 Comment: self check- reported  Pulse 88 Comment: self check- reported  Ht 5\' 2"  (1.575 m) Comment: self check- reported  Wt 120 lb (54.4 kg) Comment: self check- reported  SpO2 98% Comment: self check- reported  BMI 21.95 kg/m   Opioid Risk Score:   Fall Risk Score:  `1  Depression screen PHQ 2/9  Depression screen [facility] 2/9 08/09/2018 05/13/2018  Decreased Interest 0 0  Down, Depressed, Hopeless 0 0  PHQ - 2 Score 0 0  Altered sleeping - 0  Tired, decreased energy - 0  Change in appetite - 0  Feeling bad or failure about yourself  - 0  Trouble concentrating - 0  Moving slowly or fidgety/restless - 0  Suicidal thoughts - 0  PHQ-9 Score - 0    Review of Systems  Constitutional: Negative.   HENT: Negative.   Eyes: Negative.   Respiratory: Negative.   Cardiovascular: Negative.   Gastrointestinal: Negative.   Endocrine: Negative.   Genitourinary: Negative.   Musculoskeletal: Positive for arthralgias and gait problem.  Skin: Negative.   Allergic/Immunologic: Negative.   Hematological: Negative.   Psychiatric/Behavioral: Negative.   All other systems reviewed and are negative.      Objective:   Physical Exam  Deferred secondary to peripheral visit Her speech is with mild dysarthria      Assessment & Plan:   1.  History of bilateral embolic infarcts with the  largest infarct in the left parietal area resulting in right hemiparesis.  She still has problems with balance.  Continues with home health PT OT She has not had any falls She has not had any medication questions other than the Eliquis which she should continue as per neurology.  Her primary care can certainly take over this prescription F/u in 6 wks PMNR clinic

## 2018-08-09 NOTE — Telephone Encounter (Signed)
Please notify pt that we are not her PCP and that all subsequent med orders need to be obtain through Dr Shelia Media

## 2018-08-09 NOTE — Telephone Encounter (Signed)
Notified family, they said they had already done that

## 2018-08-11 ENCOUNTER — Telehealth: Payer: Self-pay

## 2018-08-11 NOTE — Telephone Encounter (Signed)
[name], OT from [facility] called requesting verbal orders for to recert HHOT 1wk6 and HHPT 2wk4, 1wk4. Is this okay?

## 2018-08-12 DIAGNOSIS — Z87891 Personal history of nicotine dependence: Secondary | ICD-10-CM | POA: Diagnosis not present

## 2018-08-12 DIAGNOSIS — I69398 Other sequelae of cerebral infarction: Secondary | ICD-10-CM | POA: Diagnosis not present

## 2018-08-12 DIAGNOSIS — I69391 Dysphagia following cerebral infarction: Secondary | ICD-10-CM | POA: Diagnosis not present

## 2018-08-12 DIAGNOSIS — L89153 Pressure ulcer of sacral region, stage 3: Secondary | ICD-10-CM | POA: Diagnosis not present

## 2018-08-12 DIAGNOSIS — R1310 Dysphagia, unspecified: Secondary | ICD-10-CM | POA: Diagnosis not present

## 2018-08-12 DIAGNOSIS — R413 Other amnesia: Secondary | ICD-10-CM | POA: Diagnosis not present

## 2018-08-12 DIAGNOSIS — S41102D Unspecified open wound of left upper arm, subsequent encounter: Secondary | ICD-10-CM | POA: Diagnosis not present

## 2018-08-12 DIAGNOSIS — I82403 Acute embolism and thrombosis of unspecified deep veins of lower extremity, bilateral: Secondary | ICD-10-CM | POA: Diagnosis not present

## 2018-08-12 DIAGNOSIS — R569 Unspecified convulsions: Secondary | ICD-10-CM | POA: Diagnosis not present

## 2018-08-12 DIAGNOSIS — Z4800 Encounter for change or removal of nonsurgical wound dressing: Secondary | ICD-10-CM | POA: Diagnosis not present

## 2018-08-12 DIAGNOSIS — D72819 Decreased white blood cell count, unspecified: Secondary | ICD-10-CM | POA: Diagnosis not present

## 2018-08-12 DIAGNOSIS — D62 Acute posthemorrhagic anemia: Secondary | ICD-10-CM | POA: Diagnosis not present

## 2018-08-12 DIAGNOSIS — I69319 Unspecified symptoms and signs involving cognitive functions following cerebral infarction: Secondary | ICD-10-CM | POA: Diagnosis not present

## 2018-08-12 DIAGNOSIS — E119 Type 2 diabetes mellitus without complications: Secondary | ICD-10-CM | POA: Diagnosis not present

## 2018-08-12 DIAGNOSIS — I10 Essential (primary) hypertension: Secondary | ICD-10-CM | POA: Diagnosis not present

## 2018-08-12 DIAGNOSIS — Z7901 Long term (current) use of anticoagulants: Secondary | ICD-10-CM | POA: Diagnosis not present

## 2018-08-12 DIAGNOSIS — E46 Unspecified protein-calorie malnutrition: Secondary | ICD-10-CM | POA: Diagnosis not present

## 2018-08-12 DIAGNOSIS — K529 Noninfective gastroenteritis and colitis, unspecified: Secondary | ICD-10-CM | POA: Diagnosis not present

## 2018-08-12 DIAGNOSIS — D72820 Lymphocytosis (symptomatic): Secondary | ICD-10-CM | POA: Diagnosis not present

## 2018-08-12 DIAGNOSIS — Z7409 Other reduced mobility: Secondary | ICD-10-CM | POA: Diagnosis not present

## 2018-08-12 NOTE — Telephone Encounter (Signed)
Maria Avila has been notified

## 2018-08-12 NOTE — Telephone Encounter (Signed)
OK 

## 2018-08-13 DIAGNOSIS — Z7409 Other reduced mobility: Secondary | ICD-10-CM | POA: Diagnosis not present

## 2018-08-13 DIAGNOSIS — Z87891 Personal history of nicotine dependence: Secondary | ICD-10-CM | POA: Diagnosis not present

## 2018-08-13 DIAGNOSIS — D62 Acute posthemorrhagic anemia: Secondary | ICD-10-CM | POA: Diagnosis not present

## 2018-08-13 DIAGNOSIS — I10 Essential (primary) hypertension: Secondary | ICD-10-CM | POA: Diagnosis not present

## 2018-08-13 DIAGNOSIS — R413 Other amnesia: Secondary | ICD-10-CM | POA: Diagnosis not present

## 2018-08-13 DIAGNOSIS — L89153 Pressure ulcer of sacral region, stage 3: Secondary | ICD-10-CM | POA: Diagnosis not present

## 2018-08-13 DIAGNOSIS — Z7901 Long term (current) use of anticoagulants: Secondary | ICD-10-CM | POA: Diagnosis not present

## 2018-08-13 DIAGNOSIS — I69391 Dysphagia following cerebral infarction: Secondary | ICD-10-CM | POA: Diagnosis not present

## 2018-08-13 DIAGNOSIS — R1310 Dysphagia, unspecified: Secondary | ICD-10-CM | POA: Diagnosis not present

## 2018-08-13 DIAGNOSIS — E46 Unspecified protein-calorie malnutrition: Secondary | ICD-10-CM | POA: Diagnosis not present

## 2018-08-13 DIAGNOSIS — I69398 Other sequelae of cerebral infarction: Secondary | ICD-10-CM | POA: Diagnosis not present

## 2018-08-13 DIAGNOSIS — I82403 Acute embolism and thrombosis of unspecified deep veins of lower extremity, bilateral: Secondary | ICD-10-CM | POA: Diagnosis not present

## 2018-08-13 DIAGNOSIS — K529 Noninfective gastroenteritis and colitis, unspecified: Secondary | ICD-10-CM | POA: Diagnosis not present

## 2018-08-13 DIAGNOSIS — D72819 Decreased white blood cell count, unspecified: Secondary | ICD-10-CM | POA: Diagnosis not present

## 2018-08-13 DIAGNOSIS — D72820 Lymphocytosis (symptomatic): Secondary | ICD-10-CM | POA: Diagnosis not present

## 2018-08-13 DIAGNOSIS — Z4800 Encounter for change or removal of nonsurgical wound dressing: Secondary | ICD-10-CM | POA: Diagnosis not present

## 2018-08-13 DIAGNOSIS — I69319 Unspecified symptoms and signs involving cognitive functions following cerebral infarction: Secondary | ICD-10-CM | POA: Diagnosis not present

## 2018-08-13 DIAGNOSIS — E119 Type 2 diabetes mellitus without complications: Secondary | ICD-10-CM | POA: Diagnosis not present

## 2018-08-13 DIAGNOSIS — S41102D Unspecified open wound of left upper arm, subsequent encounter: Secondary | ICD-10-CM | POA: Diagnosis not present

## 2018-08-13 DIAGNOSIS — R569 Unspecified convulsions: Secondary | ICD-10-CM | POA: Diagnosis not present

## 2018-08-17 DIAGNOSIS — K529 Noninfective gastroenteritis and colitis, unspecified: Secondary | ICD-10-CM | POA: Diagnosis not present

## 2018-08-17 DIAGNOSIS — R569 Unspecified convulsions: Secondary | ICD-10-CM | POA: Diagnosis not present

## 2018-08-17 DIAGNOSIS — I69398 Other sequelae of cerebral infarction: Secondary | ICD-10-CM | POA: Diagnosis not present

## 2018-08-17 DIAGNOSIS — I69319 Unspecified symptoms and signs involving cognitive functions following cerebral infarction: Secondary | ICD-10-CM | POA: Diagnosis not present

## 2018-08-17 DIAGNOSIS — Z4800 Encounter for change or removal of nonsurgical wound dressing: Secondary | ICD-10-CM | POA: Diagnosis not present

## 2018-08-17 DIAGNOSIS — D72820 Lymphocytosis (symptomatic): Secondary | ICD-10-CM | POA: Diagnosis not present

## 2018-08-17 DIAGNOSIS — I82403 Acute embolism and thrombosis of unspecified deep veins of lower extremity, bilateral: Secondary | ICD-10-CM | POA: Diagnosis not present

## 2018-08-17 DIAGNOSIS — Z87891 Personal history of nicotine dependence: Secondary | ICD-10-CM | POA: Diagnosis not present

## 2018-08-17 DIAGNOSIS — R413 Other amnesia: Secondary | ICD-10-CM | POA: Diagnosis not present

## 2018-08-17 DIAGNOSIS — Z7901 Long term (current) use of anticoagulants: Secondary | ICD-10-CM | POA: Diagnosis not present

## 2018-08-17 DIAGNOSIS — E46 Unspecified protein-calorie malnutrition: Secondary | ICD-10-CM | POA: Diagnosis not present

## 2018-08-17 DIAGNOSIS — S41102D Unspecified open wound of left upper arm, subsequent encounter: Secondary | ICD-10-CM | POA: Diagnosis not present

## 2018-08-17 DIAGNOSIS — D72819 Decreased white blood cell count, unspecified: Secondary | ICD-10-CM | POA: Diagnosis not present

## 2018-08-17 DIAGNOSIS — I69391 Dysphagia following cerebral infarction: Secondary | ICD-10-CM | POA: Diagnosis not present

## 2018-08-17 DIAGNOSIS — I10 Essential (primary) hypertension: Secondary | ICD-10-CM | POA: Diagnosis not present

## 2018-08-17 DIAGNOSIS — L89153 Pressure ulcer of sacral region, stage 3: Secondary | ICD-10-CM | POA: Diagnosis not present

## 2018-08-17 DIAGNOSIS — R1310 Dysphagia, unspecified: Secondary | ICD-10-CM | POA: Diagnosis not present

## 2018-08-17 DIAGNOSIS — D62 Acute posthemorrhagic anemia: Secondary | ICD-10-CM | POA: Diagnosis not present

## 2018-08-17 DIAGNOSIS — Z7409 Other reduced mobility: Secondary | ICD-10-CM | POA: Diagnosis not present

## 2018-08-17 DIAGNOSIS — E119 Type 2 diabetes mellitus without complications: Secondary | ICD-10-CM | POA: Diagnosis not present

## 2018-08-18 DIAGNOSIS — I82403 Acute embolism and thrombosis of unspecified deep veins of lower extremity, bilateral: Secondary | ICD-10-CM | POA: Diagnosis not present

## 2018-08-18 DIAGNOSIS — Z7409 Other reduced mobility: Secondary | ICD-10-CM | POA: Diagnosis not present

## 2018-08-18 DIAGNOSIS — Z7901 Long term (current) use of anticoagulants: Secondary | ICD-10-CM | POA: Diagnosis not present

## 2018-08-18 DIAGNOSIS — I69319 Unspecified symptoms and signs involving cognitive functions following cerebral infarction: Secondary | ICD-10-CM | POA: Diagnosis not present

## 2018-08-18 DIAGNOSIS — K529 Noninfective gastroenteritis and colitis, unspecified: Secondary | ICD-10-CM | POA: Diagnosis not present

## 2018-08-18 DIAGNOSIS — E46 Unspecified protein-calorie malnutrition: Secondary | ICD-10-CM | POA: Diagnosis not present

## 2018-08-18 DIAGNOSIS — E119 Type 2 diabetes mellitus without complications: Secondary | ICD-10-CM | POA: Diagnosis not present

## 2018-08-18 DIAGNOSIS — R413 Other amnesia: Secondary | ICD-10-CM | POA: Diagnosis not present

## 2018-08-18 DIAGNOSIS — I69398 Other sequelae of cerebral infarction: Secondary | ICD-10-CM | POA: Diagnosis not present

## 2018-08-18 DIAGNOSIS — R1310 Dysphagia, unspecified: Secondary | ICD-10-CM | POA: Diagnosis not present

## 2018-08-18 DIAGNOSIS — I10 Essential (primary) hypertension: Secondary | ICD-10-CM | POA: Diagnosis not present

## 2018-08-18 DIAGNOSIS — Z4800 Encounter for change or removal of nonsurgical wound dressing: Secondary | ICD-10-CM | POA: Diagnosis not present

## 2018-08-18 DIAGNOSIS — Z87891 Personal history of nicotine dependence: Secondary | ICD-10-CM | POA: Diagnosis not present

## 2018-08-18 DIAGNOSIS — R569 Unspecified convulsions: Secondary | ICD-10-CM | POA: Diagnosis not present

## 2018-08-18 DIAGNOSIS — L89153 Pressure ulcer of sacral region, stage 3: Secondary | ICD-10-CM | POA: Diagnosis not present

## 2018-08-18 DIAGNOSIS — I69391 Dysphagia following cerebral infarction: Secondary | ICD-10-CM | POA: Diagnosis not present

## 2018-08-18 DIAGNOSIS — S41102D Unspecified open wound of left upper arm, subsequent encounter: Secondary | ICD-10-CM | POA: Diagnosis not present

## 2018-08-18 DIAGNOSIS — D72820 Lymphocytosis (symptomatic): Secondary | ICD-10-CM | POA: Diagnosis not present

## 2018-08-18 DIAGNOSIS — D72819 Decreased white blood cell count, unspecified: Secondary | ICD-10-CM | POA: Diagnosis not present

## 2018-08-18 DIAGNOSIS — D62 Acute posthemorrhagic anemia: Secondary | ICD-10-CM | POA: Diagnosis not present

## 2018-08-19 DIAGNOSIS — E46 Unspecified protein-calorie malnutrition: Secondary | ICD-10-CM | POA: Diagnosis not present

## 2018-08-19 DIAGNOSIS — D72820 Lymphocytosis (symptomatic): Secondary | ICD-10-CM | POA: Diagnosis not present

## 2018-08-19 DIAGNOSIS — I82403 Acute embolism and thrombosis of unspecified deep veins of lower extremity, bilateral: Secondary | ICD-10-CM | POA: Diagnosis not present

## 2018-08-19 DIAGNOSIS — Z7901 Long term (current) use of anticoagulants: Secondary | ICD-10-CM | POA: Diagnosis not present

## 2018-08-19 DIAGNOSIS — R569 Unspecified convulsions: Secondary | ICD-10-CM | POA: Diagnosis not present

## 2018-08-19 DIAGNOSIS — I69391 Dysphagia following cerebral infarction: Secondary | ICD-10-CM | POA: Diagnosis not present

## 2018-08-19 DIAGNOSIS — I69319 Unspecified symptoms and signs involving cognitive functions following cerebral infarction: Secondary | ICD-10-CM | POA: Diagnosis not present

## 2018-08-19 DIAGNOSIS — R1310 Dysphagia, unspecified: Secondary | ICD-10-CM | POA: Diagnosis not present

## 2018-08-19 DIAGNOSIS — D72819 Decreased white blood cell count, unspecified: Secondary | ICD-10-CM | POA: Diagnosis not present

## 2018-08-19 DIAGNOSIS — I10 Essential (primary) hypertension: Secondary | ICD-10-CM | POA: Diagnosis not present

## 2018-08-19 DIAGNOSIS — D62 Acute posthemorrhagic anemia: Secondary | ICD-10-CM | POA: Diagnosis not present

## 2018-08-19 DIAGNOSIS — Z87891 Personal history of nicotine dependence: Secondary | ICD-10-CM | POA: Diagnosis not present

## 2018-08-19 DIAGNOSIS — E119 Type 2 diabetes mellitus without complications: Secondary | ICD-10-CM | POA: Diagnosis not present

## 2018-08-19 DIAGNOSIS — S41102D Unspecified open wound of left upper arm, subsequent encounter: Secondary | ICD-10-CM | POA: Diagnosis not present

## 2018-08-19 DIAGNOSIS — Z7409 Other reduced mobility: Secondary | ICD-10-CM | POA: Diagnosis not present

## 2018-08-19 DIAGNOSIS — L89153 Pressure ulcer of sacral region, stage 3: Secondary | ICD-10-CM | POA: Diagnosis not present

## 2018-08-19 DIAGNOSIS — K529 Noninfective gastroenteritis and colitis, unspecified: Secondary | ICD-10-CM | POA: Diagnosis not present

## 2018-08-19 DIAGNOSIS — Z4800 Encounter for change or removal of nonsurgical wound dressing: Secondary | ICD-10-CM | POA: Diagnosis not present

## 2018-08-19 DIAGNOSIS — R413 Other amnesia: Secondary | ICD-10-CM | POA: Diagnosis not present

## 2018-08-19 DIAGNOSIS — I69398 Other sequelae of cerebral infarction: Secondary | ICD-10-CM | POA: Diagnosis not present

## 2018-08-20 DIAGNOSIS — K529 Noninfective gastroenteritis and colitis, unspecified: Secondary | ICD-10-CM | POA: Diagnosis not present

## 2018-08-20 DIAGNOSIS — R1084 Generalized abdominal pain: Secondary | ICD-10-CM | POA: Diagnosis not present

## 2018-08-20 DIAGNOSIS — K2950 Unspecified chronic gastritis without bleeding: Secondary | ICD-10-CM | POA: Diagnosis not present

## 2018-08-24 ENCOUNTER — Other Ambulatory Visit: Payer: Self-pay | Admitting: Neurology

## 2018-08-24 DIAGNOSIS — D72820 Lymphocytosis (symptomatic): Secondary | ICD-10-CM | POA: Diagnosis not present

## 2018-08-24 DIAGNOSIS — Z4800 Encounter for change or removal of nonsurgical wound dressing: Secondary | ICD-10-CM | POA: Diagnosis not present

## 2018-08-24 DIAGNOSIS — L89153 Pressure ulcer of sacral region, stage 3: Secondary | ICD-10-CM | POA: Diagnosis not present

## 2018-08-24 DIAGNOSIS — K529 Noninfective gastroenteritis and colitis, unspecified: Secondary | ICD-10-CM | POA: Diagnosis not present

## 2018-08-24 DIAGNOSIS — E119 Type 2 diabetes mellitus without complications: Secondary | ICD-10-CM | POA: Diagnosis not present

## 2018-08-24 DIAGNOSIS — D72819 Decreased white blood cell count, unspecified: Secondary | ICD-10-CM | POA: Diagnosis not present

## 2018-08-24 DIAGNOSIS — Z87891 Personal history of nicotine dependence: Secondary | ICD-10-CM | POA: Diagnosis not present

## 2018-08-24 DIAGNOSIS — R2689 Other abnormalities of gait and mobility: Secondary | ICD-10-CM | POA: Diagnosis not present

## 2018-08-24 DIAGNOSIS — R1310 Dysphagia, unspecified: Secondary | ICD-10-CM | POA: Diagnosis not present

## 2018-08-24 DIAGNOSIS — D62 Acute posthemorrhagic anemia: Secondary | ICD-10-CM | POA: Diagnosis not present

## 2018-08-24 DIAGNOSIS — I10 Essential (primary) hypertension: Secondary | ICD-10-CM | POA: Diagnosis not present

## 2018-08-24 DIAGNOSIS — R569 Unspecified convulsions: Secondary | ICD-10-CM | POA: Diagnosis not present

## 2018-08-24 DIAGNOSIS — Z7409 Other reduced mobility: Secondary | ICD-10-CM | POA: Diagnosis not present

## 2018-08-24 DIAGNOSIS — I69319 Unspecified symptoms and signs involving cognitive functions following cerebral infarction: Secondary | ICD-10-CM | POA: Diagnosis not present

## 2018-08-24 DIAGNOSIS — I69398 Other sequelae of cerebral infarction: Secondary | ICD-10-CM | POA: Diagnosis not present

## 2018-08-24 DIAGNOSIS — I69391 Dysphagia following cerebral infarction: Secondary | ICD-10-CM | POA: Diagnosis not present

## 2018-08-24 DIAGNOSIS — Z7901 Long term (current) use of anticoagulants: Secondary | ICD-10-CM | POA: Diagnosis not present

## 2018-08-24 DIAGNOSIS — S41102D Unspecified open wound of left upper arm, subsequent encounter: Secondary | ICD-10-CM | POA: Diagnosis not present

## 2018-08-24 DIAGNOSIS — E46 Unspecified protein-calorie malnutrition: Secondary | ICD-10-CM | POA: Diagnosis not present

## 2018-08-24 DIAGNOSIS — R413 Other amnesia: Secondary | ICD-10-CM | POA: Diagnosis not present

## 2018-08-24 DIAGNOSIS — I82403 Acute embolism and thrombosis of unspecified deep veins of lower extremity, bilateral: Secondary | ICD-10-CM | POA: Diagnosis not present

## 2018-08-26 DIAGNOSIS — D72820 Lymphocytosis (symptomatic): Secondary | ICD-10-CM | POA: Diagnosis not present

## 2018-08-26 DIAGNOSIS — I82403 Acute embolism and thrombosis of unspecified deep veins of lower extremity, bilateral: Secondary | ICD-10-CM | POA: Diagnosis not present

## 2018-08-26 DIAGNOSIS — L89153 Pressure ulcer of sacral region, stage 3: Secondary | ICD-10-CM | POA: Diagnosis not present

## 2018-08-26 DIAGNOSIS — R2689 Other abnormalities of gait and mobility: Secondary | ICD-10-CM | POA: Diagnosis not present

## 2018-08-26 DIAGNOSIS — Z87891 Personal history of nicotine dependence: Secondary | ICD-10-CM | POA: Diagnosis not present

## 2018-08-26 DIAGNOSIS — D62 Acute posthemorrhagic anemia: Secondary | ICD-10-CM | POA: Diagnosis not present

## 2018-08-26 DIAGNOSIS — E119 Type 2 diabetes mellitus without complications: Secondary | ICD-10-CM

## 2018-08-26 DIAGNOSIS — D72819 Decreased white blood cell count, unspecified: Secondary | ICD-10-CM | POA: Diagnosis not present

## 2018-08-26 DIAGNOSIS — K529 Noninfective gastroenteritis and colitis, unspecified: Secondary | ICD-10-CM | POA: Diagnosis not present

## 2018-08-26 DIAGNOSIS — R569 Unspecified convulsions: Secondary | ICD-10-CM | POA: Diagnosis not present

## 2018-08-26 DIAGNOSIS — E46 Unspecified protein-calorie malnutrition: Secondary | ICD-10-CM | POA: Diagnosis not present

## 2018-08-26 DIAGNOSIS — I69391 Dysphagia following cerebral infarction: Secondary | ICD-10-CM | POA: Diagnosis not present

## 2018-08-26 DIAGNOSIS — I69319 Unspecified symptoms and signs involving cognitive functions following cerebral infarction: Secondary | ICD-10-CM | POA: Diagnosis not present

## 2018-08-26 DIAGNOSIS — Z7409 Other reduced mobility: Secondary | ICD-10-CM

## 2018-08-26 DIAGNOSIS — R413 Other amnesia: Secondary | ICD-10-CM

## 2018-08-26 DIAGNOSIS — F1099 Alcohol use, unspecified with unspecified alcohol-induced disorder: Secondary | ICD-10-CM

## 2018-08-26 DIAGNOSIS — R1310 Dysphagia, unspecified: Secondary | ICD-10-CM | POA: Diagnosis not present

## 2018-08-26 DIAGNOSIS — I69398 Other sequelae of cerebral infarction: Secondary | ICD-10-CM | POA: Diagnosis not present

## 2018-08-26 DIAGNOSIS — S41102D Unspecified open wound of left upper arm, subsequent encounter: Secondary | ICD-10-CM

## 2018-08-26 DIAGNOSIS — Z7901 Long term (current) use of anticoagulants: Secondary | ICD-10-CM

## 2018-08-26 DIAGNOSIS — Z4800 Encounter for change or removal of nonsurgical wound dressing: Secondary | ICD-10-CM | POA: Diagnosis not present

## 2018-08-26 DIAGNOSIS — I10 Essential (primary) hypertension: Secondary | ICD-10-CM | POA: Diagnosis not present

## 2018-08-26 DIAGNOSIS — I110 Hypertensive heart disease with heart failure: Secondary | ICD-10-CM

## 2018-08-27 DIAGNOSIS — I10 Essential (primary) hypertension: Secondary | ICD-10-CM | POA: Diagnosis not present

## 2018-08-27 DIAGNOSIS — Z7901 Long term (current) use of anticoagulants: Secondary | ICD-10-CM | POA: Diagnosis not present

## 2018-08-27 DIAGNOSIS — D62 Acute posthemorrhagic anemia: Secondary | ICD-10-CM | POA: Diagnosis not present

## 2018-08-27 DIAGNOSIS — R1310 Dysphagia, unspecified: Secondary | ICD-10-CM | POA: Diagnosis not present

## 2018-08-27 DIAGNOSIS — Z4800 Encounter for change or removal of nonsurgical wound dressing: Secondary | ICD-10-CM | POA: Diagnosis not present

## 2018-08-27 DIAGNOSIS — D72820 Lymphocytosis (symptomatic): Secondary | ICD-10-CM | POA: Diagnosis not present

## 2018-08-27 DIAGNOSIS — R2689 Other abnormalities of gait and mobility: Secondary | ICD-10-CM | POA: Diagnosis not present

## 2018-08-27 DIAGNOSIS — R569 Unspecified convulsions: Secondary | ICD-10-CM | POA: Diagnosis not present

## 2018-08-27 DIAGNOSIS — S41102D Unspecified open wound of left upper arm, subsequent encounter: Secondary | ICD-10-CM | POA: Diagnosis not present

## 2018-08-27 DIAGNOSIS — Z87891 Personal history of nicotine dependence: Secondary | ICD-10-CM | POA: Diagnosis not present

## 2018-08-27 DIAGNOSIS — D72819 Decreased white blood cell count, unspecified: Secondary | ICD-10-CM | POA: Diagnosis not present

## 2018-08-27 DIAGNOSIS — K529 Noninfective gastroenteritis and colitis, unspecified: Secondary | ICD-10-CM | POA: Diagnosis not present

## 2018-08-27 DIAGNOSIS — E46 Unspecified protein-calorie malnutrition: Secondary | ICD-10-CM | POA: Diagnosis not present

## 2018-08-27 DIAGNOSIS — I69398 Other sequelae of cerebral infarction: Secondary | ICD-10-CM | POA: Diagnosis not present

## 2018-08-27 DIAGNOSIS — R413 Other amnesia: Secondary | ICD-10-CM | POA: Diagnosis not present

## 2018-08-27 DIAGNOSIS — Z7409 Other reduced mobility: Secondary | ICD-10-CM | POA: Diagnosis not present

## 2018-08-27 DIAGNOSIS — I69319 Unspecified symptoms and signs involving cognitive functions following cerebral infarction: Secondary | ICD-10-CM | POA: Diagnosis not present

## 2018-08-27 DIAGNOSIS — E119 Type 2 diabetes mellitus without complications: Secondary | ICD-10-CM | POA: Diagnosis not present

## 2018-08-27 DIAGNOSIS — I69391 Dysphagia following cerebral infarction: Secondary | ICD-10-CM | POA: Diagnosis not present

## 2018-08-27 DIAGNOSIS — L89153 Pressure ulcer of sacral region, stage 3: Secondary | ICD-10-CM | POA: Diagnosis not present

## 2018-08-27 DIAGNOSIS — I82403 Acute embolism and thrombosis of unspecified deep veins of lower extremity, bilateral: Secondary | ICD-10-CM | POA: Diagnosis not present

## 2018-08-30 DIAGNOSIS — M7522 Bicipital tendinitis, left shoulder: Secondary | ICD-10-CM | POA: Diagnosis not present

## 2018-08-31 ENCOUNTER — Emergency Department (HOSPITAL_COMMUNITY)
Admission: EM | Admit: 2018-08-31 | Discharge: 2018-08-31 | Disposition: A | Discharge status: Home / Self Care | Payer: Medicare Other | Attending: Emergency Medicine | Admitting: Emergency Medicine

## 2018-08-31 ENCOUNTER — Encounter (HOSPITAL_COMMUNITY): Payer: Self-pay | Admitting: Emergency Medicine

## 2018-08-31 ENCOUNTER — Other Ambulatory Visit: Payer: Self-pay

## 2018-08-31 ENCOUNTER — Emergency Department (HOSPITAL_COMMUNITY): Payer: Medicare Other

## 2018-08-31 DIAGNOSIS — I10 Essential (primary) hypertension: Secondary | ICD-10-CM | POA: Insufficient documentation

## 2018-08-31 DIAGNOSIS — T8189XA Other complications of procedures, not elsewhere classified, initial encounter: Secondary | ICD-10-CM | POA: Diagnosis not present

## 2018-08-31 DIAGNOSIS — Z8673 Personal history of transient ischemic attack (TIA), and cerebral infarction without residual deficits: Secondary | ICD-10-CM | POA: Diagnosis not present

## 2018-08-31 DIAGNOSIS — Z7901 Long term (current) use of anticoagulants: Secondary | ICD-10-CM | POA: Insufficient documentation

## 2018-08-31 DIAGNOSIS — Z87891 Personal history of nicotine dependence: Secondary | ICD-10-CM | POA: Diagnosis not present

## 2018-08-31 DIAGNOSIS — L89154 Pressure ulcer of sacral region, stage 4: Secondary | ICD-10-CM | POA: Diagnosis not present

## 2018-08-31 DIAGNOSIS — M25512 Pain in left shoulder: Secondary | ICD-10-CM | POA: Diagnosis not present

## 2018-08-31 DIAGNOSIS — Z79899 Other long term (current) drug therapy: Secondary | ICD-10-CM | POA: Insufficient documentation

## 2018-08-31 DIAGNOSIS — I639 Cerebral infarction, unspecified: Secondary | ICD-10-CM | POA: Diagnosis not present

## 2018-08-31 DIAGNOSIS — M7582 Other shoulder lesions, left shoulder: Secondary | ICD-10-CM | POA: Diagnosis not present

## 2018-08-31 DIAGNOSIS — E119 Type 2 diabetes mellitus without complications: Secondary | ICD-10-CM | POA: Diagnosis not present

## 2018-08-31 DIAGNOSIS — M778 Other enthesopathies, not elsewhere classified: Secondary | ICD-10-CM

## 2018-08-31 DIAGNOSIS — M7522 Bicipital tendinitis, left shoulder: Secondary | ICD-10-CM | POA: Diagnosis not present

## 2018-08-31 HISTORY — DX: Cerebral infarction, unspecified: I63.9

## 2018-08-31 MED ORDER — ACETAMINOPHEN 500 MG PO TABS
1000.0000 mg | ORAL_TABLET | Freq: Once | ORAL | Status: AC
  Administered 2018-08-31: 1000 mg via ORAL
  Filled 2018-08-31: qty 2

## 2018-08-31 MED ORDER — LIDOCAINE 5 % EX PTCH: 1.0000 | MEDICATED_PATCH | CUTANEOUS | 0 refills | Status: DC

## 2018-08-31 MED ORDER — IBUPROFEN 800 MG PO TABS
800.0000 mg | ORAL_TABLET | Freq: Once | ORAL | Status: AC
  Administered 2018-08-31: 800 mg via ORAL
  Filled 2018-08-31: qty 1

## 2018-08-31 MED ORDER — LIDOCAINE 5 % EX PTCH
1.0000 | MEDICATED_PATCH | CUTANEOUS | Status: DC
  Administered 2018-08-31: 1 via TRANSDERMAL
  Filled 2018-08-31: qty 1

## 2018-08-31 NOTE — ED Notes (Signed)
Patient verbalizes understanding of discharge instructions. Opportunity for questioning and answers were provided. Armband removed by staff, pt discharged from ED by wheelchair to husband

## 2018-08-31 NOTE — ED Notes (Signed)
Husband, calvin, would like a callwhen wife is up for discharge at [phone]

## 2018-08-31 NOTE — ED Triage Notes (Signed)
Pt to ED with c/o pain in left ear radiating down to left elbow.  Onset yesterday, worse today.   Pt st's left arm feels stiff and left hand swollen.  Pt denies any injury

## 2018-08-31 NOTE — ED Provider Notes (Signed)
[facility] EMERGENCY DEPARTMENT Provider Note   CSN: [id number] Arrival date & time:       History   Chief Complaint Chief Complaint  Patient presents with  . Neck Pain    HPI Maria Avila is a 68 y.o. female.     The history is provided by the patient.  Shoulder Pain  Location:  Shoulder Shoulder location:  L shoulder Injury: no (but is in therapy )   Pain details:    Quality:  Aching   Radiates to:  L elbow   Severity:  Severe   Onset quality:  Gradual   Duration:  3 days   Timing:  Constant   Progression:  Worsening Dislocation: no   Foreign body present:  No foreign bodies Tetanus status:  Up to date Prior injury to area:  No Relieved by:  Nothing Worsened by:  Movement and stretching area Ineffective treatments:  None tried Associated symptoms: decreased range of motion and stiffness   Associated symptoms: no back pain, no fatigue, no fever, no muscle weakness, no neck pain, no numbness, no swelling and no tingling   Risk factors: no concern for non-accidental trauma   Patient with complex PMH presents with 3-4 days of decreased ROM and pain in the L shoulder.  No CP no SOB, no neck pain.  No weakness nor numbness.  No f/c/r.  No cough.  No HA.  No trauma but is in physical therapy.    Past Medical History:  Diagnosis Date  . Colitis   . Diabetes mellitus without complication ([place])   . Hypertension   . Malnutrition ([place])   . Stroke [facility])     Patient Active Problem List   Diagnosis Date Noted  . Seizure prophylaxis--probable seizure 04/12/2018  . Sacral decubitus ulcer, stage IV ([place]) 04/12/2018  . Acute blood loss anemia   . Impulsive   . Hypokalemia   . ETOH abuse   . Dysphagia, post-stroke   . Benign essential HTN   . Acute deep vein thrombosis (DVT) of distal vein of both lower extremities ([facility])   . Sinus tachycardia   . Esophageal perforation   . Perforation esophagus   . Sepsis with acute renal failure ([place])   . Acute  deep vein thrombosis (DVT) of both peroneal veins   . Leukocytosis   . Sepsis due to Pseudomonas species ([place])   . Cerebral embolism with cerebral infarction 03/09/2018  . Malnutrition of moderate degree 03/08/2018  . Empyema ([place]) 03/04/2018  . Pressure injury of skin 03/04/2018  . Acute respiratory failure with hypercapnia ([place]) 03/04/2018  . Diarrhea 02/26/2018  . Pancytopenia ([place]) 02/19/2018  . Hypotension 02/19/2018  . Severe protein-calorie malnutrition ([place]) 02/17/2018  . Colitis 02/16/2018  . Weakness 02/16/2018  . Nausea vomiting and diarrhea 02/16/2018  . Diabetes ([place]) 02/16/2018  . Essential hypertension 02/16/2018  . Anemia 02/16/2018    Past Surgical History:  Procedure Laterality Date  . BIOPSY  02/26/2018   Procedure: BIOPSY;  Surgeon: Wilford Corner, MD;  Location: [facility];  Service: Endoscopy;;  . COLONOSCOPY WITH PROPOFOL N/A 02/26/2018   Procedure: COLONOSCOPY WITH PROPOFOL;  Surgeon: Wilford Corner, MD;  Location: [facility];  Service: Endoscopy;  Laterality: N/A;  . ESOPHAGOGASTRODUODENOSCOPY (EGD) WITH PROPOFOL N/A 02/26/2018   Procedure: ESOPHAGOGASTRODUODENOSCOPY (EGD) WITH PROPOFOL;  Surgeon: Wilford Corner, MD;  Location: [facility];  Service: Endoscopy;  Laterality: N/A;  . NO PAST SURGERIES     UNCERTAIN OF NAME OF SURGERY  OB History   No obstetric history on file.      Home Medications    Prior to Admission medications   Medication Sig Start Date End Date Taking? Authorizing Provider  acetaminophen (TYLENOL) 325 MG tablet Take 1-2 tablets (325-650 mg total) by mouth every 4 (four) hours as needed for mild pain. 04/09/18   Love, Ivan Anchors, PA-C  apixaban (ELIQUIS) 5 MG TABS tablet Take 1 tablet (5 mg total) by mouth 2 (two) times daily. 05/13/18   Bayard Hugger, NP  bethanechol (URECHOLINE) 10 MG tablet Take 1 tablet (10 mg total) by mouth 3 (three) times daily. 05/13/18   Bayard Hugger, NP  collagenase (SANTYL) ointment  Apply topically daily. To ulcer then cover with damp to dry dressing. 04/09/18   Love, Ivan Anchors, PA-C  [employer] VITAMIN C 500 MG tablet TAKE 1 TABLET BY MOUTH TWICE A DAY 05/26/18   Bayard Hugger, NP  Hydrocortisone (GERHARDT'S BUTT CREAM) CREA Apply 1 application topically daily. 04/10/18   Love, Ivan Anchors, PA-C  lactobacillus acidophilus & bulgar (LACTINEX) chewable tablet Chew 1 tablet by mouth 3 (three) times daily with meals. 05/13/18   Bayard Hugger, NP  levETIRAcetam (KEPPRA) 500 MG tablet TAKE 1 TABLET BY MOUTH TWICE A DAY 08/24/18   Garvin Fila, MD  Multiple Vitamin (MULTIVITAMIN) capsule Take 1 capsule by mouth daily. 07/21/17   [provider]  pantoprazole (PROTONIX) 40 MG tablet Take 40 mg by mouth 2 (two) times daily. 07/28/18   [provider]  polycarbophil (FIBERCON) 625 MG tablet Take 2 tablets (1,250 mg total) by mouth 2 (two) times daily. 05/13/18   Bayard Hugger, NP  tamsulosin (FLOMAX) 0.4 MG CAPS capsule Take 1 capsule (0.4 mg total) by mouth daily after supper. 05/13/18   Bayard Hugger, NP  thiamine (VITAMIN B-1) 100 MG tablet TAKE 1 TABLET BY MOUTH EVERY DAY 05/25/18   Kirsteins, Luanna Salk, MD    Family History Family History  Problem Relation Age of Onset  . Alzheimer's disease Mother   . Colon cancer Father     Social History Social History   Tobacco Use  . Smoking status: Former Research scientist (life sciences)  . Smokeless tobacco: Never Used  Substance Use Topics  . Alcohol use: Not Currently  . Drug use: Not Currently     Allergies   Crestor [rosuvastatin calcium] and Zocor [simvastatin]   Review of Systems Review of Systems  Constitutional: Negative for fatigue and fever.  HENT: Negative for mouth sores and sore throat.   Respiratory: Negative for cough, chest tightness and shortness of breath.   Cardiovascular: Negative for palpitations and leg swelling.  Gastrointestinal: Negative for abdominal pain.  Musculoskeletal: Positive for arthralgias and  stiffness. Negative for back pain, joint swelling, neck pain and neck stiffness.  Neurological: Negative for weakness and numbness.  All other systems reviewed and are negative.    Physical Exam Updated Vital Signs BP (!) 155/89   Pulse 82   Temp 98.6 F (37 C) (Oral)   Resp 20   Ht 5\' 2"  (1.575 m)   Wt 54.9 kg   SpO2 100%   BMI 22.13 kg/m   Physical Exam Vitals signs and nursing note reviewed.  Constitutional:      General: She is not in acute distress.    Appearance: She is not toxic-appearing.  HENT:     Head: Normocephalic and atraumatic.     Nose: Nose normal.  Eyes:  Conjunctiva/sclera: Conjunctivae normal.     Pupils: Pupils are equal, round, and reactive to light.  Neck:     Musculoskeletal: Normal range of motion and neck supple. No neck rigidity or muscular tenderness.     Vascular: No carotid bruit.  Cardiovascular:     Rate and Rhythm: Normal rate and regular rhythm.     Pulses: Normal pulses.     Heart sounds: Normal heart sounds.  Pulmonary:     Effort: Pulmonary effort is normal.     Breath sounds: Normal breath sounds. No wheezing or rales.  Abdominal:     General: Abdomen is flat. Bowel sounds are normal.     Hernia: No hernia is present.  Musculoskeletal:     Left shoulder: She exhibits decreased range of motion. She exhibits no tenderness, no bony tenderness, no swelling, no effusion, no crepitus, no deformity, no laceration, no spasm, normal pulse and normal strength.     Left elbow: Normal. She exhibits normal range of motion, no swelling, no effusion, no deformity and no laceration. No tenderness found. No radial head, no medial epicondyle, no lateral epicondyle and no olecranon process tenderness noted.     Left wrist: Normal. She exhibits normal range of motion, no tenderness, no bony tenderness, no swelling, no effusion, no crepitus, no deformity and no laceration.     Left upper arm: Normal. She exhibits no tenderness, no bony tenderness, no  swelling, no edema, no deformity and no laceration.     Left forearm: She exhibits no tenderness, no bony tenderness, no swelling, no edema, no deformity and no laceration.     Left hand: Normal. She exhibits normal capillary refill. Normal sensation noted. Normal strength noted.     Comments: 3+ radial pulse LUE.  BUE are the same in temperature and appearance. No pain with wrist motion or pronation or supination.  No pain with flexion or extension of the elbow.  Both heads of the biceps are intact. There is  pain with passive and active motion of the left shoulder.  No winging of the left scapula  Lymphadenopathy:     Cervical: No cervical adenopathy.  Skin:    General: Skin is warm and dry.     Capillary Refill: Capillary refill takes less than 2 seconds.     Findings: No erythema.  Neurological:     General: No focal deficit present.     Mental Status: She is alert.     Deep Tendon Reflexes: Reflexes normal.  Psychiatric:        Mood and Affect: Mood normal.        Behavior: Behavior normal.      ED Treatments / Results  Labs (all labs ordered are listed, but only abnormal results are displayed) Labs Reviewed - No data to display  EKG None  Radiology No results found.  Procedures Procedures (including critical care time)  Medications Ordered in ED Medications  acetaminophen (TYLENOL) tablet 1,000 mg (has no administration in time range)  ibuprofen (ADVIL) tablet 800 mg (has no administration in time range)  lidocaine (LIDODERM) 5 % 1 patch (has no administration in time range)      Saw and appreciate nurses note but patient denies neck and ear pain.  Pain is clearly in the shoulder.  She will need to follow up with orthopedics and continue range of motion exercises. Symptoms consistent with tendinitis.  Patient was told to continue ROM exercises so she does not develop a frozen shoulder.  Final Clinical Impressions(s) / ED Diagnoses   Return for intractable cough,  coughing up blood,fevers >100.4 unrelieved by medication, shortness of breath, intractable vomiting, chest pain, shortness of breath, weakness,numbness, changes in speech, facial asymmetry,abdominal pain, passing out,Inability to tolerate liquids or food, cough, altered mental status or any concerns. No signs of systemic illness or infection. The patient is nontoxic-appearing on exam and vital signs are within normal limits.   I have reviewed the triage vital signs and the nursing notes. Pertinent labs &imaging results that were available during my care of the patient were reviewed by me and considered in my medical decision making (see chart for details).  After history, exam, and medical workup I feel the patient has been appropriately medically screened and is safe for discharge home. Pertinent diagnoses were discussed with the patient. Patient was given return precautions.   Liyana Suniga, MD 08/31/18 [phone]

## 2018-09-02 ENCOUNTER — Encounter: Payer: Self-pay | Admitting: *Deleted

## 2018-09-02 ENCOUNTER — Other Ambulatory Visit: Payer: Self-pay | Admitting: *Deleted

## 2018-09-02 DIAGNOSIS — D72820 Lymphocytosis (symptomatic): Secondary | ICD-10-CM | POA: Diagnosis not present

## 2018-09-02 DIAGNOSIS — I69319 Unspecified symptoms and signs involving cognitive functions following cerebral infarction: Secondary | ICD-10-CM | POA: Diagnosis not present

## 2018-09-02 DIAGNOSIS — S41102D Unspecified open wound of left upper arm, subsequent encounter: Secondary | ICD-10-CM | POA: Diagnosis not present

## 2018-09-02 DIAGNOSIS — L89153 Pressure ulcer of sacral region, stage 3: Secondary | ICD-10-CM | POA: Diagnosis not present

## 2018-09-02 DIAGNOSIS — R569 Unspecified convulsions: Secondary | ICD-10-CM | POA: Diagnosis not present

## 2018-09-02 DIAGNOSIS — E119 Type 2 diabetes mellitus without complications: Secondary | ICD-10-CM | POA: Diagnosis not present

## 2018-09-02 DIAGNOSIS — I69398 Other sequelae of cerebral infarction: Secondary | ICD-10-CM | POA: Diagnosis not present

## 2018-09-02 DIAGNOSIS — D62 Acute posthemorrhagic anemia: Secondary | ICD-10-CM | POA: Diagnosis not present

## 2018-09-02 DIAGNOSIS — I69391 Dysphagia following cerebral infarction: Secondary | ICD-10-CM | POA: Diagnosis not present

## 2018-09-02 DIAGNOSIS — D72819 Decreased white blood cell count, unspecified: Secondary | ICD-10-CM | POA: Diagnosis not present

## 2018-09-02 DIAGNOSIS — E46 Unspecified protein-calorie malnutrition: Secondary | ICD-10-CM | POA: Diagnosis not present

## 2018-09-02 DIAGNOSIS — R413 Other amnesia: Secondary | ICD-10-CM | POA: Diagnosis not present

## 2018-09-02 DIAGNOSIS — Z4800 Encounter for change or removal of nonsurgical wound dressing: Secondary | ICD-10-CM | POA: Diagnosis not present

## 2018-09-02 DIAGNOSIS — I82403 Acute embolism and thrombosis of unspecified deep veins of lower extremity, bilateral: Secondary | ICD-10-CM | POA: Diagnosis not present

## 2018-09-02 DIAGNOSIS — Z87891 Personal history of nicotine dependence: Secondary | ICD-10-CM | POA: Diagnosis not present

## 2018-09-02 DIAGNOSIS — Z7409 Other reduced mobility: Secondary | ICD-10-CM | POA: Diagnosis not present

## 2018-09-02 DIAGNOSIS — K529 Noninfective gastroenteritis and colitis, unspecified: Secondary | ICD-10-CM | POA: Diagnosis not present

## 2018-09-02 DIAGNOSIS — Z7901 Long term (current) use of anticoagulants: Secondary | ICD-10-CM | POA: Diagnosis not present

## 2018-09-02 DIAGNOSIS — R2689 Other abnormalities of gait and mobility: Secondary | ICD-10-CM | POA: Diagnosis not present

## 2018-09-02 DIAGNOSIS — I10 Essential (primary) hypertension: Secondary | ICD-10-CM | POA: Diagnosis not present

## 2018-09-02 DIAGNOSIS — R1310 Dysphagia, unspecified: Secondary | ICD-10-CM | POA: Diagnosis not present

## 2018-09-02 NOTE — Patient Outreach (Addendum)
[facility]) Care Management  09/02/2018  Maria Avila 27-Dec-1950 [id number]   Subjective: Telephone call to patient's home / mobile number, spoke with patient, and HIPAA verified.  Discussed [facility] [employer] Management referral follow up, patient voiced understanding, and is in agreement to follow up.   Patient states she is doing well, currently finishing up with home physical therapy with [facility], and gave verbal consent to speak with husband [name] Avila regarding healthcare needs as needed.  Patient's husband voices understanding of patient's medical diagnosis and treatment plan.  Husband states patient is able to manage some self care and has assistance as needed.   Spoke with patient and husband throughout assessment completion.  Patient and husband states patient is needing assistance with Eliquis cost, only has 5 days of medication left, both  agree to patient referral to [facility] for medication assistance.  Patient states she does not have any education material, transition of care, care coordination, disease management, disease monitoring, transportation, or community resource needs at this time. States she is very appreciative of the follow up and is in agreement to receive [facility] Management services.     Objective:  Per KPN (Knowledge Performance Now, point of care tool) and chart review, patient had ED visit on 08/31/2018 for neck pain, tendonitis, shoulder pain.   Patient also has a history of diabetes, hypertension, colitis, malnutrition, stroke, DVT, Perforation esophagus, Sepsis due to Pseudomonas species, and Sacral decubitus ulcer, stage IV.        Assessment:  Received [employer] Management Referral on 09/02/2018.  Referral reason: Medication assistance for Eliquis, patient currently has a 7 days left referral source given permission to speak with patient's husband Maria Avila).   Referral source: [facility].   Screening follow up completed and will refer patient to [facility] for Eliquis medication assistance.      Plan: RNCM will refer patient to [facility] for Eliquis medication assistance.      Havier Deeb H. Annia Friendly, BSN, [place] Management [facility] Telephonic CM Phone: [phone] Fax: [phone]

## 2018-09-03 ENCOUNTER — Other Ambulatory Visit: Payer: Self-pay | Admitting: Pharmacist

## 2018-09-03 ENCOUNTER — Telehealth: Payer: Self-pay

## 2018-09-03 DIAGNOSIS — Z7409 Other reduced mobility: Secondary | ICD-10-CM | POA: Diagnosis not present

## 2018-09-03 DIAGNOSIS — I82403 Acute embolism and thrombosis of unspecified deep veins of lower extremity, bilateral: Secondary | ICD-10-CM | POA: Diagnosis not present

## 2018-09-03 DIAGNOSIS — E119 Type 2 diabetes mellitus without complications: Secondary | ICD-10-CM | POA: Diagnosis not present

## 2018-09-03 DIAGNOSIS — S41102D Unspecified open wound of left upper arm, subsequent encounter: Secondary | ICD-10-CM | POA: Diagnosis not present

## 2018-09-03 DIAGNOSIS — Z4800 Encounter for change or removal of nonsurgical wound dressing: Secondary | ICD-10-CM | POA: Diagnosis not present

## 2018-09-03 DIAGNOSIS — D62 Acute posthemorrhagic anemia: Secondary | ICD-10-CM | POA: Diagnosis not present

## 2018-09-03 DIAGNOSIS — R1310 Dysphagia, unspecified: Secondary | ICD-10-CM | POA: Diagnosis not present

## 2018-09-03 DIAGNOSIS — R413 Other amnesia: Secondary | ICD-10-CM | POA: Diagnosis not present

## 2018-09-03 DIAGNOSIS — R569 Unspecified convulsions: Secondary | ICD-10-CM | POA: Diagnosis not present

## 2018-09-03 DIAGNOSIS — I10 Essential (primary) hypertension: Secondary | ICD-10-CM | POA: Diagnosis not present

## 2018-09-03 DIAGNOSIS — Z7901 Long term (current) use of anticoagulants: Secondary | ICD-10-CM | POA: Diagnosis not present

## 2018-09-03 DIAGNOSIS — R2689 Other abnormalities of gait and mobility: Secondary | ICD-10-CM | POA: Diagnosis not present

## 2018-09-03 DIAGNOSIS — D72819 Decreased white blood cell count, unspecified: Secondary | ICD-10-CM | POA: Diagnosis not present

## 2018-09-03 DIAGNOSIS — I69391 Dysphagia following cerebral infarction: Secondary | ICD-10-CM | POA: Diagnosis not present

## 2018-09-03 DIAGNOSIS — D72820 Lymphocytosis (symptomatic): Secondary | ICD-10-CM | POA: Diagnosis not present

## 2018-09-03 DIAGNOSIS — E46 Unspecified protein-calorie malnutrition: Secondary | ICD-10-CM | POA: Diagnosis not present

## 2018-09-03 DIAGNOSIS — Z87891 Personal history of nicotine dependence: Secondary | ICD-10-CM | POA: Diagnosis not present

## 2018-09-03 DIAGNOSIS — I69398 Other sequelae of cerebral infarction: Secondary | ICD-10-CM | POA: Diagnosis not present

## 2018-09-03 DIAGNOSIS — I69319 Unspecified symptoms and signs involving cognitive functions following cerebral infarction: Secondary | ICD-10-CM | POA: Diagnosis not present

## 2018-09-03 DIAGNOSIS — L89153 Pressure ulcer of sacral region, stage 3: Secondary | ICD-10-CM | POA: Diagnosis not present

## 2018-09-03 DIAGNOSIS — K529 Noninfective gastroenteritis and colitis, unspecified: Secondary | ICD-10-CM | POA: Diagnosis not present

## 2018-09-03 NOTE — Telephone Encounter (Signed)
Henderson Newcomer OT [facility] called requesting verbal orders for the patient to use Moist heat for 15 min then alternate to using cold on her shoulder after therapy sessions due to increased pain on the areas. Called her back and approved verbal orders.

## 2018-09-03 NOTE — Patient Outreach (Signed)
[name]) Care Management  [employer]   09/03/2018  Maria Avila [birth year] [id number]  Reason for referral: Medication Assistance  Referral source: [place] Utilization Management Current insurance: [facility]  PMHx includes but not limited to:  T2DM, HTN, DVT on chronic anticoagulation, colitis, anemia, stroke, esophageal perforation  Outreach:  Successful telephone call with [name].  HIPAA identifiers verified.   Subjective:  Patient reports she is currently taking Eliquis and pays $47 / month.  She would like to know if she is eligible for any patient assistance programs.  Patient denies a medication review.    Objective: Lab Results  Component Value Date   CREATININE 0.58 05/13/2018   CREATININE 0.60 04/05/2018   CREATININE 0.74 03/26/2018    Lab Results  Component Value Date   HGBA1C 5.1 03/10/2018    Lipid Panel     Component Value Date/Time   CHOL 93 03/10/2018 0451   TRIG 81 03/10/2018 0451   HDL 15 (L) 03/10/2018 0451   CHOLHDL 6.2 03/10/2018 0451   VLDL 16 03/10/2018 0451   LDLCALC 62 03/10/2018 0451    BP Readings from Last 3 Encounters:  08/31/18 (!) 149/77  08/09/18 136/82  06/15/18 131/89    Allergies  Allergen Reactions  . Crestor [Rosuvastatin Calcium] Swelling and Other (See Comments)    Swelling of legs and muscle weakness  . Zocor [Simvastatin] Swelling and Other (See Comments)    Swelling of legs and muscle weakness    Medications Reviewed Today    Reviewed by Veatrice Kells, MD (Physician) on 08/31/18 at 0146  Med List Status: <None>  Medication Order Taking? Sig Documenting Provider Last Dose Status Informant  acetaminophen (TYLENOL) 325 MG tablet [id number]  Take 1-2 tablets (325-650 mg total) by mouth every 4 (four) hours as needed for mild pain. Bary Leriche, PA-C  Active   apixaban (ELIQUIS) 5 MG TABS tablet [id number]  Take 1 tablet (5 mg total) by mouth 2 (two) times daily.  Bayard Hugger, NP  Active   bethanechol (URECHOLINE) 10 MG tablet [id number]  Take 1 tablet (10 mg total) by mouth 3 (three) times daily. Bayard Hugger, NP  Active   collagenase Annitta Needs) ointment [id number]  Apply topically daily. To ulcer then cover with damp to dry dressing. Bary Leriche, PA-C  Active   CVS VITAMIN C 500 MG tablet [id number]  TAKE 1 TABLET BY MOUTH TWICE A DAY Bayard Hugger, NP  Active   Hydrocortisone (GERHARDT'S BUTT CREAM) CREA [id number]  Apply 1 application topically daily. Love, Pamela S, PA-C  Active   lactobacillus acidophilus & bulgar (LACTINEX) chewable tablet [id number]  Chew 1 tablet by mouth 3 (three) times daily with meals. Bayard Hugger, NP  Active   levETIRAcetam (KEPPRA) 500 MG tablet [id number]  TAKE 1 TABLET BY MOUTH TWICE A DAY Garvin Fila, MD  Active   Multiple Vitamin (MULTIVITAMIN) capsule [id number]  Take 1 capsule by mouth daily. [provider]  Active Nursing Home Medication Administration Guide (MAG)  pantoprazole (PROTONIX) 40 MG tablet [id number]  Take 40 mg by mouth 2 (two) times daily. [provider]  Active   polycarbophil (FIBERCON) 625 MG tablet [id number]  Take 2 tablets (1,250 mg total) by mouth 2 (two) times daily. Bayard Hugger, NP  Active   tamsulosin (FLOMAX) 0.4 MG CAPS capsule [id number]  Take 1 capsule (0.4 mg total) by mouth daily after supper. Bayard Hugger, NP  Active   thiamine (VITAMIN B-1) 100 MG tablet [id number]  TAKE 1 TABLET BY MOUTH EVERY DAY Kirsteins, Luanna Salk, MD  Active           Medication Assistance Findings:  Medication assistance needs identified: Apixaban  Extra Help:  Not eligible for Extra Help Low Income Subsidy based on reported income and assets  Patient Assistance Programs: Eliquis made by BMS o Income requirement met: Borderline household income per patient report o Out-of-pocket prescription expenditure met:   No (3% household income)) - Patient has not met application  requirements to apply for this patient assistance program at this time. Reviewed program requirements with patient who voiced understanding.  Patient denies any other medication questions or concerns.   Additional medication assistance options reviewed with patient as warranted:  Insurance OTC catalogue  Plan: . Will close [facility] pharmacy case as no further medication needs identified at this time.  Am happy to assist in the future as needed.     Ralene Bathe, PharmD, [place] [phone]

## 2018-09-07 DIAGNOSIS — E119 Type 2 diabetes mellitus without complications: Secondary | ICD-10-CM | POA: Diagnosis not present

## 2018-09-07 DIAGNOSIS — D72820 Lymphocytosis (symptomatic): Secondary | ICD-10-CM | POA: Diagnosis not present

## 2018-09-07 DIAGNOSIS — Z4800 Encounter for change or removal of nonsurgical wound dressing: Secondary | ICD-10-CM | POA: Diagnosis not present

## 2018-09-07 DIAGNOSIS — I69319 Unspecified symptoms and signs involving cognitive functions following cerebral infarction: Secondary | ICD-10-CM | POA: Diagnosis not present

## 2018-09-07 DIAGNOSIS — D72819 Decreased white blood cell count, unspecified: Secondary | ICD-10-CM | POA: Diagnosis not present

## 2018-09-07 DIAGNOSIS — D62 Acute posthemorrhagic anemia: Secondary | ICD-10-CM | POA: Diagnosis not present

## 2018-09-07 DIAGNOSIS — K529 Noninfective gastroenteritis and colitis, unspecified: Secondary | ICD-10-CM | POA: Diagnosis not present

## 2018-09-07 DIAGNOSIS — R413 Other amnesia: Secondary | ICD-10-CM | POA: Diagnosis not present

## 2018-09-07 DIAGNOSIS — I10 Essential (primary) hypertension: Secondary | ICD-10-CM | POA: Diagnosis not present

## 2018-09-07 DIAGNOSIS — I69398 Other sequelae of cerebral infarction: Secondary | ICD-10-CM | POA: Diagnosis not present

## 2018-09-07 DIAGNOSIS — Z87891 Personal history of nicotine dependence: Secondary | ICD-10-CM | POA: Diagnosis not present

## 2018-09-07 DIAGNOSIS — E46 Unspecified protein-calorie malnutrition: Secondary | ICD-10-CM | POA: Diagnosis not present

## 2018-09-07 DIAGNOSIS — Z7901 Long term (current) use of anticoagulants: Secondary | ICD-10-CM | POA: Diagnosis not present

## 2018-09-07 DIAGNOSIS — I69391 Dysphagia following cerebral infarction: Secondary | ICD-10-CM | POA: Diagnosis not present

## 2018-09-07 DIAGNOSIS — R1310 Dysphagia, unspecified: Secondary | ICD-10-CM | POA: Diagnosis not present

## 2018-09-07 DIAGNOSIS — R2689 Other abnormalities of gait and mobility: Secondary | ICD-10-CM | POA: Diagnosis not present

## 2018-09-07 DIAGNOSIS — Z7409 Other reduced mobility: Secondary | ICD-10-CM | POA: Diagnosis not present

## 2018-09-07 DIAGNOSIS — I82403 Acute embolism and thrombosis of unspecified deep veins of lower extremity, bilateral: Secondary | ICD-10-CM | POA: Diagnosis not present

## 2018-09-07 DIAGNOSIS — L89153 Pressure ulcer of sacral region, stage 3: Secondary | ICD-10-CM | POA: Diagnosis not present

## 2018-09-07 DIAGNOSIS — S41102D Unspecified open wound of left upper arm, subsequent encounter: Secondary | ICD-10-CM | POA: Diagnosis not present

## 2018-09-07 DIAGNOSIS — R569 Unspecified convulsions: Secondary | ICD-10-CM | POA: Diagnosis not present

## 2018-09-08 DIAGNOSIS — I69319 Unspecified symptoms and signs involving cognitive functions following cerebral infarction: Secondary | ICD-10-CM | POA: Diagnosis not present

## 2018-09-08 DIAGNOSIS — Z7901 Long term (current) use of anticoagulants: Secondary | ICD-10-CM | POA: Diagnosis not present

## 2018-09-08 DIAGNOSIS — E46 Unspecified protein-calorie malnutrition: Secondary | ICD-10-CM | POA: Diagnosis not present

## 2018-09-08 DIAGNOSIS — E119 Type 2 diabetes mellitus without complications: Secondary | ICD-10-CM | POA: Diagnosis not present

## 2018-09-08 DIAGNOSIS — R569 Unspecified convulsions: Secondary | ICD-10-CM | POA: Diagnosis not present

## 2018-09-08 DIAGNOSIS — K529 Noninfective gastroenteritis and colitis, unspecified: Secondary | ICD-10-CM | POA: Diagnosis not present

## 2018-09-08 DIAGNOSIS — I639 Cerebral infarction, unspecified: Secondary | ICD-10-CM | POA: Diagnosis not present

## 2018-09-08 DIAGNOSIS — I69398 Other sequelae of cerebral infarction: Secondary | ICD-10-CM | POA: Diagnosis not present

## 2018-09-08 DIAGNOSIS — D62 Acute posthemorrhagic anemia: Secondary | ICD-10-CM | POA: Diagnosis not present

## 2018-09-08 DIAGNOSIS — Z87891 Personal history of nicotine dependence: Secondary | ICD-10-CM | POA: Diagnosis not present

## 2018-09-08 DIAGNOSIS — S41102D Unspecified open wound of left upper arm, subsequent encounter: Secondary | ICD-10-CM | POA: Diagnosis not present

## 2018-09-08 DIAGNOSIS — R1310 Dysphagia, unspecified: Secondary | ICD-10-CM | POA: Diagnosis not present

## 2018-09-08 DIAGNOSIS — I82403 Acute embolism and thrombosis of unspecified deep veins of lower extremity, bilateral: Secondary | ICD-10-CM | POA: Diagnosis not present

## 2018-09-08 DIAGNOSIS — R413 Other amnesia: Secondary | ICD-10-CM | POA: Diagnosis not present

## 2018-09-08 DIAGNOSIS — D72820 Lymphocytosis (symptomatic): Secondary | ICD-10-CM | POA: Diagnosis not present

## 2018-09-08 DIAGNOSIS — D72819 Decreased white blood cell count, unspecified: Secondary | ICD-10-CM | POA: Diagnosis not present

## 2018-09-08 DIAGNOSIS — I10 Essential (primary) hypertension: Secondary | ICD-10-CM | POA: Diagnosis not present

## 2018-09-08 DIAGNOSIS — Z4800 Encounter for change or removal of nonsurgical wound dressing: Secondary | ICD-10-CM | POA: Diagnosis not present

## 2018-09-08 DIAGNOSIS — L89153 Pressure ulcer of sacral region, stage 3: Secondary | ICD-10-CM | POA: Diagnosis not present

## 2018-09-08 DIAGNOSIS — Z7409 Other reduced mobility: Secondary | ICD-10-CM | POA: Diagnosis not present

## 2018-09-08 DIAGNOSIS — I69391 Dysphagia following cerebral infarction: Secondary | ICD-10-CM | POA: Diagnosis not present

## 2018-09-08 DIAGNOSIS — R2689 Other abnormalities of gait and mobility: Secondary | ICD-10-CM | POA: Diagnosis not present

## 2018-09-14 DIAGNOSIS — I82403 Acute embolism and thrombosis of unspecified deep veins of lower extremity, bilateral: Secondary | ICD-10-CM | POA: Diagnosis not present

## 2018-09-14 DIAGNOSIS — R569 Unspecified convulsions: Secondary | ICD-10-CM | POA: Diagnosis not present

## 2018-09-14 DIAGNOSIS — I69391 Dysphagia following cerebral infarction: Secondary | ICD-10-CM | POA: Diagnosis not present

## 2018-09-14 DIAGNOSIS — D62 Acute posthemorrhagic anemia: Secondary | ICD-10-CM | POA: Diagnosis not present

## 2018-09-14 DIAGNOSIS — K529 Noninfective gastroenteritis and colitis, unspecified: Secondary | ICD-10-CM | POA: Diagnosis not present

## 2018-09-14 DIAGNOSIS — L89153 Pressure ulcer of sacral region, stage 3: Secondary | ICD-10-CM | POA: Diagnosis not present

## 2018-09-14 DIAGNOSIS — R1310 Dysphagia, unspecified: Secondary | ICD-10-CM | POA: Diagnosis not present

## 2018-09-14 DIAGNOSIS — D72820 Lymphocytosis (symptomatic): Secondary | ICD-10-CM | POA: Diagnosis not present

## 2018-09-14 DIAGNOSIS — Z4800 Encounter for change or removal of nonsurgical wound dressing: Secondary | ICD-10-CM | POA: Diagnosis not present

## 2018-09-14 DIAGNOSIS — S41102D Unspecified open wound of left upper arm, subsequent encounter: Secondary | ICD-10-CM | POA: Diagnosis not present

## 2018-09-14 DIAGNOSIS — I639 Cerebral infarction, unspecified: Secondary | ICD-10-CM | POA: Diagnosis not present

## 2018-09-14 DIAGNOSIS — E46 Unspecified protein-calorie malnutrition: Secondary | ICD-10-CM | POA: Diagnosis not present

## 2018-09-14 DIAGNOSIS — I69319 Unspecified symptoms and signs involving cognitive functions following cerebral infarction: Secondary | ICD-10-CM | POA: Diagnosis not present

## 2018-09-14 DIAGNOSIS — I69398 Other sequelae of cerebral infarction: Secondary | ICD-10-CM | POA: Diagnosis not present

## 2018-09-14 DIAGNOSIS — L89154 Pressure ulcer of sacral region, stage 4: Secondary | ICD-10-CM | POA: Diagnosis not present

## 2018-09-14 DIAGNOSIS — R2689 Other abnormalities of gait and mobility: Secondary | ICD-10-CM | POA: Diagnosis not present

## 2018-09-14 DIAGNOSIS — Z7409 Other reduced mobility: Secondary | ICD-10-CM | POA: Diagnosis not present

## 2018-09-14 DIAGNOSIS — E119 Type 2 diabetes mellitus without complications: Secondary | ICD-10-CM | POA: Diagnosis not present

## 2018-09-14 DIAGNOSIS — Z7901 Long term (current) use of anticoagulants: Secondary | ICD-10-CM | POA: Diagnosis not present

## 2018-09-14 DIAGNOSIS — Z87891 Personal history of nicotine dependence: Secondary | ICD-10-CM | POA: Diagnosis not present

## 2018-09-14 DIAGNOSIS — I10 Essential (primary) hypertension: Secondary | ICD-10-CM | POA: Diagnosis not present

## 2018-09-14 DIAGNOSIS — R413 Other amnesia: Secondary | ICD-10-CM | POA: Diagnosis not present

## 2018-09-14 DIAGNOSIS — D72819 Decreased white blood cell count, unspecified: Secondary | ICD-10-CM | POA: Diagnosis not present

## 2018-09-22 ENCOUNTER — Telehealth: Payer: Self-pay

## 2018-09-22 DIAGNOSIS — R600 Localized edema: Secondary | ICD-10-CM | POA: Diagnosis not present

## 2018-09-22 NOTE — Telephone Encounter (Signed)
Maria Avila OT [facility] called requesting verbal orders for a 3 visit extension of 1xwk for 3wks.  Called her back and approved verbal orders.

## 2018-09-23 DIAGNOSIS — Z87891 Personal history of nicotine dependence: Secondary | ICD-10-CM | POA: Diagnosis not present

## 2018-09-23 DIAGNOSIS — E119 Type 2 diabetes mellitus without complications: Secondary | ICD-10-CM | POA: Diagnosis not present

## 2018-09-23 DIAGNOSIS — K529 Noninfective gastroenteritis and colitis, unspecified: Secondary | ICD-10-CM | POA: Diagnosis not present

## 2018-09-23 DIAGNOSIS — D62 Acute posthemorrhagic anemia: Secondary | ICD-10-CM | POA: Diagnosis not present

## 2018-09-23 DIAGNOSIS — I69398 Other sequelae of cerebral infarction: Secondary | ICD-10-CM | POA: Diagnosis not present

## 2018-09-23 DIAGNOSIS — R413 Other amnesia: Secondary | ICD-10-CM | POA: Diagnosis not present

## 2018-09-23 DIAGNOSIS — R569 Unspecified convulsions: Secondary | ICD-10-CM | POA: Diagnosis not present

## 2018-09-23 DIAGNOSIS — Z7409 Other reduced mobility: Secondary | ICD-10-CM | POA: Diagnosis not present

## 2018-09-23 DIAGNOSIS — I69319 Unspecified symptoms and signs involving cognitive functions following cerebral infarction: Secondary | ICD-10-CM | POA: Diagnosis not present

## 2018-09-23 DIAGNOSIS — Z7901 Long term (current) use of anticoagulants: Secondary | ICD-10-CM | POA: Diagnosis not present

## 2018-09-23 DIAGNOSIS — L89153 Pressure ulcer of sacral region, stage 3: Secondary | ICD-10-CM | POA: Diagnosis not present

## 2018-09-23 DIAGNOSIS — I82403 Acute embolism and thrombosis of unspecified deep veins of lower extremity, bilateral: Secondary | ICD-10-CM | POA: Diagnosis not present

## 2018-09-23 DIAGNOSIS — Z4800 Encounter for change or removal of nonsurgical wound dressing: Secondary | ICD-10-CM | POA: Diagnosis not present

## 2018-09-23 DIAGNOSIS — S41102D Unspecified open wound of left upper arm, subsequent encounter: Secondary | ICD-10-CM | POA: Diagnosis not present

## 2018-09-23 DIAGNOSIS — R2689 Other abnormalities of gait and mobility: Secondary | ICD-10-CM | POA: Diagnosis not present

## 2018-09-23 DIAGNOSIS — D72819 Decreased white blood cell count, unspecified: Secondary | ICD-10-CM | POA: Diagnosis not present

## 2018-09-23 DIAGNOSIS — I69391 Dysphagia following cerebral infarction: Secondary | ICD-10-CM | POA: Diagnosis not present

## 2018-09-23 DIAGNOSIS — R1310 Dysphagia, unspecified: Secondary | ICD-10-CM | POA: Diagnosis not present

## 2018-09-23 DIAGNOSIS — D72820 Lymphocytosis (symptomatic): Secondary | ICD-10-CM | POA: Diagnosis not present

## 2018-09-23 DIAGNOSIS — I10 Essential (primary) hypertension: Secondary | ICD-10-CM | POA: Diagnosis not present

## 2018-09-23 DIAGNOSIS — E46 Unspecified protein-calorie malnutrition: Secondary | ICD-10-CM | POA: Diagnosis not present

## 2018-09-24 ENCOUNTER — Ambulatory Visit: Payer: Medicare Other | Admitting: Adult Health

## 2018-09-24 DIAGNOSIS — D62 Acute posthemorrhagic anemia: Secondary | ICD-10-CM | POA: Diagnosis not present

## 2018-09-24 DIAGNOSIS — Z4800 Encounter for change or removal of nonsurgical wound dressing: Secondary | ICD-10-CM | POA: Diagnosis not present

## 2018-09-24 DIAGNOSIS — E119 Type 2 diabetes mellitus without complications: Secondary | ICD-10-CM | POA: Diagnosis not present

## 2018-09-24 DIAGNOSIS — E46 Unspecified protein-calorie malnutrition: Secondary | ICD-10-CM | POA: Diagnosis not present

## 2018-09-24 DIAGNOSIS — D72819 Decreased white blood cell count, unspecified: Secondary | ICD-10-CM | POA: Diagnosis not present

## 2018-09-24 DIAGNOSIS — I69391 Dysphagia following cerebral infarction: Secondary | ICD-10-CM | POA: Diagnosis not present

## 2018-09-24 DIAGNOSIS — R569 Unspecified convulsions: Secondary | ICD-10-CM | POA: Diagnosis not present

## 2018-09-24 DIAGNOSIS — I82403 Acute embolism and thrombosis of unspecified deep veins of lower extremity, bilateral: Secondary | ICD-10-CM | POA: Diagnosis not present

## 2018-09-24 DIAGNOSIS — Z7901 Long term (current) use of anticoagulants: Secondary | ICD-10-CM | POA: Diagnosis not present

## 2018-09-24 DIAGNOSIS — Z87891 Personal history of nicotine dependence: Secondary | ICD-10-CM | POA: Diagnosis not present

## 2018-09-24 DIAGNOSIS — I69398 Other sequelae of cerebral infarction: Secondary | ICD-10-CM | POA: Diagnosis not present

## 2018-09-24 DIAGNOSIS — K529 Noninfective gastroenteritis and colitis, unspecified: Secondary | ICD-10-CM | POA: Diagnosis not present

## 2018-09-24 DIAGNOSIS — S41102D Unspecified open wound of left upper arm, subsequent encounter: Secondary | ICD-10-CM | POA: Diagnosis not present

## 2018-09-24 DIAGNOSIS — R2689 Other abnormalities of gait and mobility: Secondary | ICD-10-CM | POA: Diagnosis not present

## 2018-09-24 DIAGNOSIS — D72820 Lymphocytosis (symptomatic): Secondary | ICD-10-CM | POA: Diagnosis not present

## 2018-09-24 DIAGNOSIS — I10 Essential (primary) hypertension: Secondary | ICD-10-CM | POA: Diagnosis not present

## 2018-09-24 DIAGNOSIS — R413 Other amnesia: Secondary | ICD-10-CM | POA: Diagnosis not present

## 2018-09-24 DIAGNOSIS — Z7409 Other reduced mobility: Secondary | ICD-10-CM | POA: Diagnosis not present

## 2018-09-24 DIAGNOSIS — R1310 Dysphagia, unspecified: Secondary | ICD-10-CM | POA: Diagnosis not present

## 2018-09-24 DIAGNOSIS — I69319 Unspecified symptoms and signs involving cognitive functions following cerebral infarction: Secondary | ICD-10-CM | POA: Diagnosis not present

## 2018-09-24 DIAGNOSIS — L89153 Pressure ulcer of sacral region, stage 3: Secondary | ICD-10-CM | POA: Diagnosis not present

## 2018-09-28 ENCOUNTER — Encounter: Payer: Medicare Other | Admitting: Physical Medicine & Rehabilitation

## 2018-09-28 DIAGNOSIS — E46 Unspecified protein-calorie malnutrition: Secondary | ICD-10-CM | POA: Diagnosis not present

## 2018-09-28 DIAGNOSIS — I10 Essential (primary) hypertension: Secondary | ICD-10-CM | POA: Diagnosis not present

## 2018-09-28 DIAGNOSIS — R569 Unspecified convulsions: Secondary | ICD-10-CM | POA: Diagnosis not present

## 2018-09-28 DIAGNOSIS — R2689 Other abnormalities of gait and mobility: Secondary | ICD-10-CM | POA: Diagnosis not present

## 2018-09-28 DIAGNOSIS — D72820 Lymphocytosis (symptomatic): Secondary | ICD-10-CM | POA: Diagnosis not present

## 2018-09-28 DIAGNOSIS — D72819 Decreased white blood cell count, unspecified: Secondary | ICD-10-CM | POA: Diagnosis not present

## 2018-09-28 DIAGNOSIS — I69319 Unspecified symptoms and signs involving cognitive functions following cerebral infarction: Secondary | ICD-10-CM | POA: Diagnosis not present

## 2018-09-28 DIAGNOSIS — I69391 Dysphagia following cerebral infarction: Secondary | ICD-10-CM | POA: Diagnosis not present

## 2018-09-28 DIAGNOSIS — Z4800 Encounter for change or removal of nonsurgical wound dressing: Secondary | ICD-10-CM | POA: Diagnosis not present

## 2018-09-28 DIAGNOSIS — K529 Noninfective gastroenteritis and colitis, unspecified: Secondary | ICD-10-CM | POA: Diagnosis not present

## 2018-09-28 DIAGNOSIS — S41102D Unspecified open wound of left upper arm, subsequent encounter: Secondary | ICD-10-CM | POA: Diagnosis not present

## 2018-09-28 DIAGNOSIS — R413 Other amnesia: Secondary | ICD-10-CM | POA: Diagnosis not present

## 2018-09-28 DIAGNOSIS — E119 Type 2 diabetes mellitus without complications: Secondary | ICD-10-CM | POA: Diagnosis not present

## 2018-09-28 DIAGNOSIS — R1310 Dysphagia, unspecified: Secondary | ICD-10-CM | POA: Diagnosis not present

## 2018-09-28 DIAGNOSIS — Z87891 Personal history of nicotine dependence: Secondary | ICD-10-CM | POA: Diagnosis not present

## 2018-09-28 DIAGNOSIS — I69398 Other sequelae of cerebral infarction: Secondary | ICD-10-CM | POA: Diagnosis not present

## 2018-09-28 DIAGNOSIS — L89153 Pressure ulcer of sacral region, stage 3: Secondary | ICD-10-CM | POA: Diagnosis not present

## 2018-09-28 DIAGNOSIS — D62 Acute posthemorrhagic anemia: Secondary | ICD-10-CM | POA: Diagnosis not present

## 2018-09-28 DIAGNOSIS — Z7901 Long term (current) use of anticoagulants: Secondary | ICD-10-CM | POA: Diagnosis not present

## 2018-09-28 DIAGNOSIS — I82403 Acute embolism and thrombosis of unspecified deep veins of lower extremity, bilateral: Secondary | ICD-10-CM | POA: Diagnosis not present

## 2018-09-28 DIAGNOSIS — Z7409 Other reduced mobility: Secondary | ICD-10-CM | POA: Diagnosis not present

## 2018-09-29 DIAGNOSIS — K529 Noninfective gastroenteritis and colitis, unspecified: Secondary | ICD-10-CM | POA: Diagnosis not present

## 2018-09-29 DIAGNOSIS — E46 Unspecified protein-calorie malnutrition: Secondary | ICD-10-CM | POA: Diagnosis not present

## 2018-09-29 DIAGNOSIS — Z7409 Other reduced mobility: Secondary | ICD-10-CM | POA: Diagnosis not present

## 2018-09-29 DIAGNOSIS — L89153 Pressure ulcer of sacral region, stage 3: Secondary | ICD-10-CM | POA: Diagnosis not present

## 2018-09-29 DIAGNOSIS — I82403 Acute embolism and thrombosis of unspecified deep veins of lower extremity, bilateral: Secondary | ICD-10-CM | POA: Diagnosis not present

## 2018-09-29 DIAGNOSIS — D72819 Decreased white blood cell count, unspecified: Secondary | ICD-10-CM | POA: Diagnosis not present

## 2018-09-29 DIAGNOSIS — S41102D Unspecified open wound of left upper arm, subsequent encounter: Secondary | ICD-10-CM | POA: Diagnosis not present

## 2018-09-29 DIAGNOSIS — I69319 Unspecified symptoms and signs involving cognitive functions following cerebral infarction: Secondary | ICD-10-CM | POA: Diagnosis not present

## 2018-09-29 DIAGNOSIS — I69398 Other sequelae of cerebral infarction: Secondary | ICD-10-CM | POA: Diagnosis not present

## 2018-09-29 DIAGNOSIS — Z4800 Encounter for change or removal of nonsurgical wound dressing: Secondary | ICD-10-CM | POA: Diagnosis not present

## 2018-09-29 DIAGNOSIS — Z7901 Long term (current) use of anticoagulants: Secondary | ICD-10-CM | POA: Diagnosis not present

## 2018-09-29 DIAGNOSIS — D72820 Lymphocytosis (symptomatic): Secondary | ICD-10-CM | POA: Diagnosis not present

## 2018-09-29 DIAGNOSIS — D62 Acute posthemorrhagic anemia: Secondary | ICD-10-CM | POA: Diagnosis not present

## 2018-09-29 DIAGNOSIS — E119 Type 2 diabetes mellitus without complications: Secondary | ICD-10-CM | POA: Diagnosis not present

## 2018-09-29 DIAGNOSIS — I69391 Dysphagia following cerebral infarction: Secondary | ICD-10-CM | POA: Diagnosis not present

## 2018-09-29 DIAGNOSIS — R2689 Other abnormalities of gait and mobility: Secondary | ICD-10-CM | POA: Diagnosis not present

## 2018-09-29 DIAGNOSIS — R1310 Dysphagia, unspecified: Secondary | ICD-10-CM | POA: Diagnosis not present

## 2018-09-29 DIAGNOSIS — I10 Essential (primary) hypertension: Secondary | ICD-10-CM | POA: Diagnosis not present

## 2018-09-29 DIAGNOSIS — R569 Unspecified convulsions: Secondary | ICD-10-CM | POA: Diagnosis not present

## 2018-09-29 DIAGNOSIS — Z87891 Personal history of nicotine dependence: Secondary | ICD-10-CM | POA: Diagnosis not present

## 2018-09-29 DIAGNOSIS — R413 Other amnesia: Secondary | ICD-10-CM | POA: Diagnosis not present

## 2018-09-30 DIAGNOSIS — T8189XA Other complications of procedures, not elsewhere classified, initial encounter: Secondary | ICD-10-CM | POA: Diagnosis not present

## 2018-09-30 DIAGNOSIS — I639 Cerebral infarction, unspecified: Secondary | ICD-10-CM | POA: Diagnosis not present

## 2018-09-30 DIAGNOSIS — L89154 Pressure ulcer of sacral region, stage 4: Secondary | ICD-10-CM | POA: Diagnosis not present

## 2018-10-05 ENCOUNTER — Telehealth: Payer: Self-pay | Admitting: *Deleted

## 2018-10-05 NOTE — Telephone Encounter (Signed)
Jenna PT called to get verbal ok to recert for PT 1wk4, 1q o wk4 and for OT 1wk5.  Approval given per Dr Letta Pate last note.

## 2018-10-06 DIAGNOSIS — I10 Essential (primary) hypertension: Secondary | ICD-10-CM | POA: Diagnosis not present

## 2018-10-06 DIAGNOSIS — D62 Acute posthemorrhagic anemia: Secondary | ICD-10-CM | POA: Diagnosis not present

## 2018-10-06 DIAGNOSIS — K529 Noninfective gastroenteritis and colitis, unspecified: Secondary | ICD-10-CM | POA: Diagnosis not present

## 2018-10-06 DIAGNOSIS — R413 Other amnesia: Secondary | ICD-10-CM | POA: Diagnosis not present

## 2018-10-06 DIAGNOSIS — S41102D Unspecified open wound of left upper arm, subsequent encounter: Secondary | ICD-10-CM | POA: Diagnosis not present

## 2018-10-06 DIAGNOSIS — I69391 Dysphagia following cerebral infarction: Secondary | ICD-10-CM | POA: Diagnosis not present

## 2018-10-06 DIAGNOSIS — Z7901 Long term (current) use of anticoagulants: Secondary | ICD-10-CM | POA: Diagnosis not present

## 2018-10-06 DIAGNOSIS — R1310 Dysphagia, unspecified: Secondary | ICD-10-CM | POA: Diagnosis not present

## 2018-10-06 DIAGNOSIS — I69319 Unspecified symptoms and signs involving cognitive functions following cerebral infarction: Secondary | ICD-10-CM | POA: Diagnosis not present

## 2018-10-06 DIAGNOSIS — R2689 Other abnormalities of gait and mobility: Secondary | ICD-10-CM | POA: Diagnosis not present

## 2018-10-06 DIAGNOSIS — Z87891 Personal history of nicotine dependence: Secondary | ICD-10-CM | POA: Diagnosis not present

## 2018-10-06 DIAGNOSIS — Z7409 Other reduced mobility: Secondary | ICD-10-CM | POA: Diagnosis not present

## 2018-10-06 DIAGNOSIS — D72819 Decreased white blood cell count, unspecified: Secondary | ICD-10-CM | POA: Diagnosis not present

## 2018-10-06 DIAGNOSIS — L89153 Pressure ulcer of sacral region, stage 3: Secondary | ICD-10-CM | POA: Diagnosis not present

## 2018-10-06 DIAGNOSIS — I69398 Other sequelae of cerebral infarction: Secondary | ICD-10-CM | POA: Diagnosis not present

## 2018-10-06 DIAGNOSIS — E46 Unspecified protein-calorie malnutrition: Secondary | ICD-10-CM | POA: Diagnosis not present

## 2018-10-06 DIAGNOSIS — E119 Type 2 diabetes mellitus without complications: Secondary | ICD-10-CM | POA: Diagnosis not present

## 2018-10-06 DIAGNOSIS — R569 Unspecified convulsions: Secondary | ICD-10-CM | POA: Diagnosis not present

## 2018-10-06 DIAGNOSIS — I82403 Acute embolism and thrombosis of unspecified deep veins of lower extremity, bilateral: Secondary | ICD-10-CM | POA: Diagnosis not present

## 2018-10-06 DIAGNOSIS — D72820 Lymphocytosis (symptomatic): Secondary | ICD-10-CM | POA: Diagnosis not present

## 2018-10-06 DIAGNOSIS — Z4800 Encounter for change or removal of nonsurgical wound dressing: Secondary | ICD-10-CM | POA: Diagnosis not present

## 2018-10-07 ENCOUNTER — Other Ambulatory Visit: Payer: Self-pay | Admitting: Registered Nurse

## 2018-10-08 ENCOUNTER — Other Ambulatory Visit: Payer: Self-pay

## 2018-10-08 ENCOUNTER — Encounter: Payer: Medicare Other | Attending: Physical Medicine & Rehabilitation | Admitting: Physical Medicine & Rehabilitation

## 2018-10-08 ENCOUNTER — Encounter: Payer: Self-pay | Admitting: Physical Medicine & Rehabilitation

## 2018-10-08 VITALS — BP 122/82 | HR 88 | Temp 97.7°F | Ht 62.0 in | Wt 128.0 lb

## 2018-10-08 DIAGNOSIS — I10 Essential (primary) hypertension: Secondary | ICD-10-CM | POA: Insufficient documentation

## 2018-10-08 DIAGNOSIS — I63412 Cerebral infarction due to embolism of left middle cerebral artery: Secondary | ICD-10-CM | POA: Diagnosis not present

## 2018-10-08 DIAGNOSIS — D62 Acute posthemorrhagic anemia: Secondary | ICD-10-CM | POA: Insufficient documentation

## 2018-10-08 DIAGNOSIS — Z298 Encounter for other specified prophylactic measures: Secondary | ICD-10-CM | POA: Insufficient documentation

## 2018-10-08 DIAGNOSIS — I824Z3 Acute embolism and thrombosis of unspecified deep veins of distal lower extremity, bilateral: Secondary | ICD-10-CM | POA: Insufficient documentation

## 2018-10-08 DIAGNOSIS — L89154 Pressure ulcer of sacral region, stage 4: Secondary | ICD-10-CM | POA: Insufficient documentation

## 2018-10-08 DIAGNOSIS — E876 Hypokalemia: Secondary | ICD-10-CM | POA: Insufficient documentation

## 2018-10-08 DIAGNOSIS — I69351 Hemiplegia and hemiparesis following cerebral infarction affecting right dominant side: Secondary | ICD-10-CM

## 2018-10-08 DIAGNOSIS — E43 Unspecified severe protein-calorie malnutrition: Secondary | ICD-10-CM | POA: Insufficient documentation

## 2018-10-08 NOTE — Progress Notes (Addendum)
Subjective:    Patient ID: Maria Avila, female    DOB: 1950/09/01, 68 y.o.   MRN: [id number] 68 year old female with history of T2DM, HTN, heavy alcohol use, lymphocytosis/leukopenia, problems with colitis,N/V/D with 40 lbs weight loss and negative work up on 10/15/19admission. She was discharged to SNF for rehab but readmitted on 03/04/2018 with mental status changes, hypoxia and tachycardia due to sepsis. She was found to have near complete opacification of the left chest due to empyema.She was intubated and left chest tube placed with drainage of purulent material. Dr. Roxan Hockey recommended Gastrografin swallow which was negative for perforation. He felt that empyema was likely due to aspiration pneumonia and cultures positive for E. coli, enterococcus and pseudomonas aeruginosa  The patient also suffered a left parietal infarct during that acute care hospitalization. She was at rehab at [facility] from 03/17/2018 04/09/2018 Her last office visit with PM&R was 06/10/2018 HPI Consent for phone visit   Cont with home health PT, OT Uses Walker and occ cane Does not use physical assist for walker Dressing and bathing requires assist with getting in/out of shower  Waiting on OP PT, OT  Now on fluid pills for ankle swelling Left hand hand sometimes feels cold and achy  Takes ES tylenol for pain , as needed basis Pain Inventory Average Pain 0 Pain Right Now 0 My pain is na  In the last 24 hours, has pain interfered with the following? General activity 0 Relation with others 0 Enjoyment of life 0 What TIME of day is your pain at its worst? na Sleep (in general) Good  Pain is worse with: na Pain improves with: na Relief from Meds: na  Mobility walk without assistance walk with assistance use a walker ability to climb steps?  yes do you drive?  no  Function retired I need assistance with the following:  dressing, meal prep, household duties and  shopping  Neuro/Psych weakness trouble walking  Prior Studies Any changes since last visit?  no  Physicians involved in your care Any changes since last visit?  no   Family History  Problem Relation Age of Onset   Alzheimer's disease Mother    Colon cancer Father    Social History   Socioeconomic History   Marital status: Married    Spouse name: Not on file   Number of children: Not on file   Years of education: Not on file   Highest education level: Not on file  Occupational History   Not on file  Social Needs   Financial resource strain: Not on file   Food insecurity:    Worry: Not on file    Inability: Not on file   Transportation needs:    Medical: Not on file    Non-medical: Not on file  Tobacco Use   Smoking status: Former Smoker   Smokeless tobacco: Never Used  Substance and Sexual Activity   Alcohol use: Not Currently   Drug use: Not Currently   Sexual activity: Not on file  Lifestyle   Physical activity:    Days per week: Not on file    Minutes per session: Not on file   Stress: Not on file  Relationships   Social connections:    Talks on phone: Not on file    Gets together: Not on file    Attends religious service: Not on file    Active member of club or organization: Not on file    Attends meetings of  clubs or organizations: Not on file    Relationship status: Not on file  Other Topics Concern   Not on file  Social History Narrative   Not on file   Past Surgical History:  Procedure Laterality Date   BIOPSY  02/26/2018   Procedure: BIOPSY;  Surgeon: Wilford Corner, MD;  Location: [facility];  Service: Endoscopy;;   COLONOSCOPY WITH PROPOFOL N/A 02/26/2018   Procedure: COLONOSCOPY WITH PROPOFOL;  Surgeon: Wilford Corner, MD;  Location: [facility];  Service: Endoscopy;  Laterality: N/A;   ESOPHAGOGASTRODUODENOSCOPY (EGD) WITH PROPOFOL N/A 02/26/2018   Procedure: ESOPHAGOGASTRODUODENOSCOPY (EGD) WITH PROPOFOL;   Surgeon: Wilford Corner, MD;  Location: [facility];  Service: Endoscopy;  Laterality: N/A;   NO PAST SURGERIES     UNCERTAIN OF NAME OF SURGERY   Past Medical History:  Diagnosis Date   Colitis    Diabetes mellitus without complication ([place])    Hypertension    Malnutrition ([place])    Stroke ([place])    There were no vitals taken for this visit.  Opioid Risk Score:   Fall Risk Score:  `1  Depression screen PHQ 2/9  Depression screen [facility] 2/9 09/02/2018 08/09/2018 05/13/2018  Decreased Interest 0 0 0  Down, Depressed, Hopeless 0 0 0  PHQ - 2 Score 0 0 0  Altered sleeping - - 0  Tired, decreased energy - - 0  Change in appetite - - 0  Feeling bad or failure about yourself  - - 0  Trouble concentrating - - 0  Moving slowly or fidgety/restless - - 0  Suicidal thoughts - - 0  PHQ-9 Score - - 0     Review of Systems  Constitutional: Negative.   HENT: Negative.   Eyes: Negative.   Respiratory: Negative.   Cardiovascular: Negative.   Gastrointestinal: Negative.   Endocrine: Negative.   Genitourinary: Negative.   Musculoskeletal: Positive for gait problem.  Skin: Negative.   Allergic/Immunologic: Negative.   Neurological: Positive for weakness.  Hematological: Negative.   Psychiatric/Behavioral: Negative.   All other systems reviewed and are negative.      Objective:   Physical Exam Neurological:     Mental Status: She is alert and oriented to person, place, and time. Mental status is at baseline.  Psychiatric:        Mood and Affect: Mood normal.    Speech without dysarthria or aphasia.   Remainder of exam is limited by phone visit     Assessment & Plan:  1.  Left parietal infarct she is doing well in terms of her stroke.  Still is not back to her baseline.  She has balance issues primarily some upper extremity functional issues as well  Plans to start OP PT and OT once spot is available Will do HHPT, OT until that time   Phone visit duration 10  minutes

## 2018-10-09 DIAGNOSIS — I639 Cerebral infarction, unspecified: Secondary | ICD-10-CM | POA: Diagnosis not present

## 2018-10-14 DIAGNOSIS — I69319 Unspecified symptoms and signs involving cognitive functions following cerebral infarction: Secondary | ICD-10-CM | POA: Diagnosis not present

## 2018-10-14 DIAGNOSIS — R569 Unspecified convulsions: Secondary | ICD-10-CM | POA: Diagnosis not present

## 2018-10-14 DIAGNOSIS — L89153 Pressure ulcer of sacral region, stage 3: Secondary | ICD-10-CM | POA: Diagnosis not present

## 2018-10-14 DIAGNOSIS — Z87891 Personal history of nicotine dependence: Secondary | ICD-10-CM | POA: Diagnosis not present

## 2018-10-14 DIAGNOSIS — E46 Unspecified protein-calorie malnutrition: Secondary | ICD-10-CM | POA: Diagnosis not present

## 2018-10-14 DIAGNOSIS — R413 Other amnesia: Secondary | ICD-10-CM | POA: Diagnosis not present

## 2018-10-14 DIAGNOSIS — I82403 Acute embolism and thrombosis of unspecified deep veins of lower extremity, bilateral: Secondary | ICD-10-CM | POA: Diagnosis not present

## 2018-10-14 DIAGNOSIS — R2689 Other abnormalities of gait and mobility: Secondary | ICD-10-CM | POA: Diagnosis not present

## 2018-10-14 DIAGNOSIS — S41102D Unspecified open wound of left upper arm, subsequent encounter: Secondary | ICD-10-CM | POA: Diagnosis not present

## 2018-10-14 DIAGNOSIS — D72820 Lymphocytosis (symptomatic): Secondary | ICD-10-CM | POA: Diagnosis not present

## 2018-10-14 DIAGNOSIS — E119 Type 2 diabetes mellitus without complications: Secondary | ICD-10-CM | POA: Diagnosis not present

## 2018-10-14 DIAGNOSIS — I69398 Other sequelae of cerebral infarction: Secondary | ICD-10-CM | POA: Diagnosis not present

## 2018-10-14 DIAGNOSIS — R1310 Dysphagia, unspecified: Secondary | ICD-10-CM | POA: Diagnosis not present

## 2018-10-14 DIAGNOSIS — I69391 Dysphagia following cerebral infarction: Secondary | ICD-10-CM | POA: Diagnosis not present

## 2018-10-14 DIAGNOSIS — Z7409 Other reduced mobility: Secondary | ICD-10-CM | POA: Diagnosis not present

## 2018-10-14 DIAGNOSIS — Z7901 Long term (current) use of anticoagulants: Secondary | ICD-10-CM | POA: Diagnosis not present

## 2018-10-14 DIAGNOSIS — I10 Essential (primary) hypertension: Secondary | ICD-10-CM | POA: Diagnosis not present

## 2018-10-14 DIAGNOSIS — Z4800 Encounter for change or removal of nonsurgical wound dressing: Secondary | ICD-10-CM | POA: Diagnosis not present

## 2018-10-14 DIAGNOSIS — D62 Acute posthemorrhagic anemia: Secondary | ICD-10-CM | POA: Diagnosis not present

## 2018-10-14 DIAGNOSIS — K529 Noninfective gastroenteritis and colitis, unspecified: Secondary | ICD-10-CM | POA: Diagnosis not present

## 2018-10-14 DIAGNOSIS — D72819 Decreased white blood cell count, unspecified: Secondary | ICD-10-CM | POA: Diagnosis not present

## 2018-10-18 ENCOUNTER — Telehealth: Payer: Self-pay | Admitting: *Deleted

## 2018-10-18 DIAGNOSIS — L89154 Pressure ulcer of sacral region, stage 4: Secondary | ICD-10-CM | POA: Diagnosis not present

## 2018-10-18 DIAGNOSIS — I639 Cerebral infarction, unspecified: Secondary | ICD-10-CM | POA: Diagnosis not present

## 2018-10-18 NOTE — Telephone Encounter (Signed)
[name] OT called for POC recert 1wk 6 until able  To go to outpt neuro rehab. Approval given.

## 2018-10-19 DIAGNOSIS — I82403 Acute embolism and thrombosis of unspecified deep veins of lower extremity, bilateral: Secondary | ICD-10-CM | POA: Diagnosis not present

## 2018-10-19 DIAGNOSIS — E119 Type 2 diabetes mellitus without complications: Secondary | ICD-10-CM | POA: Diagnosis not present

## 2018-10-19 DIAGNOSIS — Z7901 Long term (current) use of anticoagulants: Secondary | ICD-10-CM | POA: Diagnosis not present

## 2018-10-19 DIAGNOSIS — Z87891 Personal history of nicotine dependence: Secondary | ICD-10-CM | POA: Diagnosis not present

## 2018-10-19 DIAGNOSIS — D62 Acute posthemorrhagic anemia: Secondary | ICD-10-CM | POA: Diagnosis not present

## 2018-10-19 DIAGNOSIS — I10 Essential (primary) hypertension: Secondary | ICD-10-CM | POA: Diagnosis not present

## 2018-10-19 DIAGNOSIS — K529 Noninfective gastroenteritis and colitis, unspecified: Secondary | ICD-10-CM | POA: Diagnosis not present

## 2018-10-19 DIAGNOSIS — D72819 Decreased white blood cell count, unspecified: Secondary | ICD-10-CM | POA: Diagnosis not present

## 2018-10-19 DIAGNOSIS — E46 Unspecified protein-calorie malnutrition: Secondary | ICD-10-CM | POA: Diagnosis not present

## 2018-10-19 DIAGNOSIS — I69391 Dysphagia following cerebral infarction: Secondary | ICD-10-CM | POA: Diagnosis not present

## 2018-10-19 DIAGNOSIS — R2689 Other abnormalities of gait and mobility: Secondary | ICD-10-CM | POA: Diagnosis not present

## 2018-10-19 DIAGNOSIS — R413 Other amnesia: Secondary | ICD-10-CM | POA: Diagnosis not present

## 2018-10-19 DIAGNOSIS — Z7409 Other reduced mobility: Secondary | ICD-10-CM | POA: Diagnosis not present

## 2018-10-19 DIAGNOSIS — R1310 Dysphagia, unspecified: Secondary | ICD-10-CM | POA: Diagnosis not present

## 2018-10-19 DIAGNOSIS — I69398 Other sequelae of cerebral infarction: Secondary | ICD-10-CM | POA: Diagnosis not present

## 2018-10-19 DIAGNOSIS — D72820 Lymphocytosis (symptomatic): Secondary | ICD-10-CM | POA: Diagnosis not present

## 2018-10-19 DIAGNOSIS — R569 Unspecified convulsions: Secondary | ICD-10-CM | POA: Diagnosis not present

## 2018-10-20 DIAGNOSIS — R569 Unspecified convulsions: Secondary | ICD-10-CM | POA: Diagnosis not present

## 2018-10-20 DIAGNOSIS — R413 Other amnesia: Secondary | ICD-10-CM | POA: Diagnosis not present

## 2018-10-20 DIAGNOSIS — D62 Acute posthemorrhagic anemia: Secondary | ICD-10-CM | POA: Diagnosis not present

## 2018-10-20 DIAGNOSIS — E119 Type 2 diabetes mellitus without complications: Secondary | ICD-10-CM | POA: Diagnosis not present

## 2018-10-20 DIAGNOSIS — E46 Unspecified protein-calorie malnutrition: Secondary | ICD-10-CM | POA: Diagnosis not present

## 2018-10-20 DIAGNOSIS — D72819 Decreased white blood cell count, unspecified: Secondary | ICD-10-CM | POA: Diagnosis not present

## 2018-10-20 DIAGNOSIS — I10 Essential (primary) hypertension: Secondary | ICD-10-CM | POA: Diagnosis not present

## 2018-10-20 DIAGNOSIS — Z7409 Other reduced mobility: Secondary | ICD-10-CM | POA: Diagnosis not present

## 2018-10-20 DIAGNOSIS — I82403 Acute embolism and thrombosis of unspecified deep veins of lower extremity, bilateral: Secondary | ICD-10-CM | POA: Diagnosis not present

## 2018-10-20 DIAGNOSIS — K529 Noninfective gastroenteritis and colitis, unspecified: Secondary | ICD-10-CM | POA: Diagnosis not present

## 2018-10-20 DIAGNOSIS — Z87891 Personal history of nicotine dependence: Secondary | ICD-10-CM | POA: Diagnosis not present

## 2018-10-20 DIAGNOSIS — D72820 Lymphocytosis (symptomatic): Secondary | ICD-10-CM | POA: Diagnosis not present

## 2018-10-20 DIAGNOSIS — I69398 Other sequelae of cerebral infarction: Secondary | ICD-10-CM | POA: Diagnosis not present

## 2018-10-20 DIAGNOSIS — I69391 Dysphagia following cerebral infarction: Secondary | ICD-10-CM | POA: Diagnosis not present

## 2018-10-20 DIAGNOSIS — Z7901 Long term (current) use of anticoagulants: Secondary | ICD-10-CM | POA: Diagnosis not present

## 2018-10-20 DIAGNOSIS — R2689 Other abnormalities of gait and mobility: Secondary | ICD-10-CM | POA: Diagnosis not present

## 2018-10-20 DIAGNOSIS — R1310 Dysphagia, unspecified: Secondary | ICD-10-CM | POA: Diagnosis not present

## 2018-10-28 DIAGNOSIS — Z7409 Other reduced mobility: Secondary | ICD-10-CM | POA: Diagnosis not present

## 2018-10-28 DIAGNOSIS — I69391 Dysphagia following cerebral infarction: Secondary | ICD-10-CM | POA: Diagnosis not present

## 2018-10-28 DIAGNOSIS — R413 Other amnesia: Secondary | ICD-10-CM | POA: Diagnosis not present

## 2018-10-28 DIAGNOSIS — D72819 Decreased white blood cell count, unspecified: Secondary | ICD-10-CM | POA: Diagnosis not present

## 2018-10-28 DIAGNOSIS — Z87891 Personal history of nicotine dependence: Secondary | ICD-10-CM | POA: Diagnosis not present

## 2018-10-28 DIAGNOSIS — D72820 Lymphocytosis (symptomatic): Secondary | ICD-10-CM | POA: Diagnosis not present

## 2018-10-28 DIAGNOSIS — K529 Noninfective gastroenteritis and colitis, unspecified: Secondary | ICD-10-CM | POA: Diagnosis not present

## 2018-10-28 DIAGNOSIS — R569 Unspecified convulsions: Secondary | ICD-10-CM | POA: Diagnosis not present

## 2018-10-28 DIAGNOSIS — E46 Unspecified protein-calorie malnutrition: Secondary | ICD-10-CM | POA: Diagnosis not present

## 2018-10-28 DIAGNOSIS — I69398 Other sequelae of cerebral infarction: Secondary | ICD-10-CM | POA: Diagnosis not present

## 2018-10-28 DIAGNOSIS — I82403 Acute embolism and thrombosis of unspecified deep veins of lower extremity, bilateral: Secondary | ICD-10-CM | POA: Diagnosis not present

## 2018-10-28 DIAGNOSIS — E119 Type 2 diabetes mellitus without complications: Secondary | ICD-10-CM | POA: Diagnosis not present

## 2018-10-28 DIAGNOSIS — D62 Acute posthemorrhagic anemia: Secondary | ICD-10-CM | POA: Diagnosis not present

## 2018-10-28 DIAGNOSIS — R1310 Dysphagia, unspecified: Secondary | ICD-10-CM | POA: Diagnosis not present

## 2018-10-28 DIAGNOSIS — R2689 Other abnormalities of gait and mobility: Secondary | ICD-10-CM | POA: Diagnosis not present

## 2018-10-28 DIAGNOSIS — Z7901 Long term (current) use of anticoagulants: Secondary | ICD-10-CM | POA: Diagnosis not present

## 2018-10-28 DIAGNOSIS — I10 Essential (primary) hypertension: Secondary | ICD-10-CM | POA: Diagnosis not present

## 2018-10-31 DIAGNOSIS — I639 Cerebral infarction, unspecified: Secondary | ICD-10-CM | POA: Diagnosis not present

## 2018-10-31 DIAGNOSIS — T8189XA Other complications of procedures, not elsewhere classified, initial encounter: Secondary | ICD-10-CM | POA: Diagnosis not present

## 2018-10-31 DIAGNOSIS — L89154 Pressure ulcer of sacral region, stage 4: Secondary | ICD-10-CM | POA: Diagnosis not present

## 2018-11-02 DIAGNOSIS — Z7409 Other reduced mobility: Secondary | ICD-10-CM | POA: Diagnosis not present

## 2018-11-02 DIAGNOSIS — D62 Acute posthemorrhagic anemia: Secondary | ICD-10-CM | POA: Diagnosis not present

## 2018-11-02 DIAGNOSIS — I69391 Dysphagia following cerebral infarction: Secondary | ICD-10-CM | POA: Diagnosis not present

## 2018-11-02 DIAGNOSIS — I82403 Acute embolism and thrombosis of unspecified deep veins of lower extremity, bilateral: Secondary | ICD-10-CM | POA: Diagnosis not present

## 2018-11-02 DIAGNOSIS — E46 Unspecified protein-calorie malnutrition: Secondary | ICD-10-CM | POA: Diagnosis not present

## 2018-11-02 DIAGNOSIS — I10 Essential (primary) hypertension: Secondary | ICD-10-CM | POA: Diagnosis not present

## 2018-11-02 DIAGNOSIS — K529 Noninfective gastroenteritis and colitis, unspecified: Secondary | ICD-10-CM | POA: Diagnosis not present

## 2018-11-02 DIAGNOSIS — Z7901 Long term (current) use of anticoagulants: Secondary | ICD-10-CM | POA: Diagnosis not present

## 2018-11-02 DIAGNOSIS — D72820 Lymphocytosis (symptomatic): Secondary | ICD-10-CM | POA: Diagnosis not present

## 2018-11-02 DIAGNOSIS — R569 Unspecified convulsions: Secondary | ICD-10-CM | POA: Diagnosis not present

## 2018-11-02 DIAGNOSIS — I69398 Other sequelae of cerebral infarction: Secondary | ICD-10-CM | POA: Diagnosis not present

## 2018-11-02 DIAGNOSIS — D72819 Decreased white blood cell count, unspecified: Secondary | ICD-10-CM | POA: Diagnosis not present

## 2018-11-02 DIAGNOSIS — Z87891 Personal history of nicotine dependence: Secondary | ICD-10-CM | POA: Diagnosis not present

## 2018-11-02 DIAGNOSIS — R413 Other amnesia: Secondary | ICD-10-CM | POA: Diagnosis not present

## 2018-11-02 DIAGNOSIS — E119 Type 2 diabetes mellitus without complications: Secondary | ICD-10-CM | POA: Diagnosis not present

## 2018-11-02 DIAGNOSIS — R1310 Dysphagia, unspecified: Secondary | ICD-10-CM | POA: Diagnosis not present

## 2018-11-02 DIAGNOSIS — R2689 Other abnormalities of gait and mobility: Secondary | ICD-10-CM | POA: Diagnosis not present

## 2018-11-03 DIAGNOSIS — I69398 Other sequelae of cerebral infarction: Secondary | ICD-10-CM | POA: Diagnosis not present

## 2018-11-03 DIAGNOSIS — R2689 Other abnormalities of gait and mobility: Secondary | ICD-10-CM | POA: Diagnosis not present

## 2018-11-03 DIAGNOSIS — Z7409 Other reduced mobility: Secondary | ICD-10-CM | POA: Diagnosis not present

## 2018-11-03 DIAGNOSIS — Z87891 Personal history of nicotine dependence: Secondary | ICD-10-CM | POA: Diagnosis not present

## 2018-11-03 DIAGNOSIS — I82403 Acute embolism and thrombosis of unspecified deep veins of lower extremity, bilateral: Secondary | ICD-10-CM | POA: Diagnosis not present

## 2018-11-03 DIAGNOSIS — Z7901 Long term (current) use of anticoagulants: Secondary | ICD-10-CM | POA: Diagnosis not present

## 2018-11-03 DIAGNOSIS — E119 Type 2 diabetes mellitus without complications: Secondary | ICD-10-CM | POA: Diagnosis not present

## 2018-11-03 DIAGNOSIS — D72819 Decreased white blood cell count, unspecified: Secondary | ICD-10-CM | POA: Diagnosis not present

## 2018-11-03 DIAGNOSIS — R413 Other amnesia: Secondary | ICD-10-CM | POA: Diagnosis not present

## 2018-11-03 DIAGNOSIS — I10 Essential (primary) hypertension: Secondary | ICD-10-CM | POA: Diagnosis not present

## 2018-11-03 DIAGNOSIS — R569 Unspecified convulsions: Secondary | ICD-10-CM | POA: Diagnosis not present

## 2018-11-03 DIAGNOSIS — I69391 Dysphagia following cerebral infarction: Secondary | ICD-10-CM | POA: Diagnosis not present

## 2018-11-03 DIAGNOSIS — D72820 Lymphocytosis (symptomatic): Secondary | ICD-10-CM | POA: Diagnosis not present

## 2018-11-03 DIAGNOSIS — R1310 Dysphagia, unspecified: Secondary | ICD-10-CM | POA: Diagnosis not present

## 2018-11-03 DIAGNOSIS — D62 Acute posthemorrhagic anemia: Secondary | ICD-10-CM | POA: Diagnosis not present

## 2018-11-03 DIAGNOSIS — K529 Noninfective gastroenteritis and colitis, unspecified: Secondary | ICD-10-CM | POA: Diagnosis not present

## 2018-11-03 DIAGNOSIS — E46 Unspecified protein-calorie malnutrition: Secondary | ICD-10-CM | POA: Diagnosis not present

## 2018-11-08 DIAGNOSIS — D72819 Decreased white blood cell count, unspecified: Secondary | ICD-10-CM

## 2018-11-08 DIAGNOSIS — R413 Other amnesia: Secondary | ICD-10-CM

## 2018-11-08 DIAGNOSIS — Z87891 Personal history of nicotine dependence: Secondary | ICD-10-CM

## 2018-11-08 DIAGNOSIS — I10 Essential (primary) hypertension: Secondary | ICD-10-CM

## 2018-11-08 DIAGNOSIS — E46 Unspecified protein-calorie malnutrition: Secondary | ICD-10-CM

## 2018-11-08 DIAGNOSIS — I82403 Acute embolism and thrombosis of unspecified deep veins of lower extremity, bilateral: Secondary | ICD-10-CM

## 2018-11-08 DIAGNOSIS — I69398 Other sequelae of cerebral infarction: Secondary | ICD-10-CM | POA: Diagnosis not present

## 2018-11-08 DIAGNOSIS — Z7901 Long term (current) use of anticoagulants: Secondary | ICD-10-CM

## 2018-11-08 DIAGNOSIS — I639 Cerebral infarction, unspecified: Secondary | ICD-10-CM | POA: Diagnosis not present

## 2018-11-08 DIAGNOSIS — R1310 Dysphagia, unspecified: Secondary | ICD-10-CM

## 2018-11-08 DIAGNOSIS — E119 Type 2 diabetes mellitus without complications: Secondary | ICD-10-CM

## 2018-11-08 DIAGNOSIS — D72820 Lymphocytosis (symptomatic): Secondary | ICD-10-CM

## 2018-11-08 DIAGNOSIS — R569 Unspecified convulsions: Secondary | ICD-10-CM | POA: Diagnosis not present

## 2018-11-08 DIAGNOSIS — K529 Noninfective gastroenteritis and colitis, unspecified: Secondary | ICD-10-CM

## 2018-11-08 DIAGNOSIS — Z7409 Other reduced mobility: Secondary | ICD-10-CM | POA: Diagnosis not present

## 2018-11-08 DIAGNOSIS — F1099 Alcohol use, unspecified with unspecified alcohol-induced disorder: Secondary | ICD-10-CM

## 2018-11-08 DIAGNOSIS — I69391 Dysphagia following cerebral infarction: Secondary | ICD-10-CM | POA: Diagnosis not present

## 2018-11-08 DIAGNOSIS — D62 Acute posthemorrhagic anemia: Secondary | ICD-10-CM

## 2018-11-08 DIAGNOSIS — R2689 Other abnormalities of gait and mobility: Secondary | ICD-10-CM | POA: Diagnosis not present

## 2018-11-10 DIAGNOSIS — I82403 Acute embolism and thrombosis of unspecified deep veins of lower extremity, bilateral: Secondary | ICD-10-CM | POA: Diagnosis not present

## 2018-11-10 DIAGNOSIS — Z87891 Personal history of nicotine dependence: Secondary | ICD-10-CM | POA: Diagnosis not present

## 2018-11-10 DIAGNOSIS — E119 Type 2 diabetes mellitus without complications: Secondary | ICD-10-CM | POA: Diagnosis not present

## 2018-11-10 DIAGNOSIS — R569 Unspecified convulsions: Secondary | ICD-10-CM | POA: Diagnosis not present

## 2018-11-10 DIAGNOSIS — R1310 Dysphagia, unspecified: Secondary | ICD-10-CM | POA: Diagnosis not present

## 2018-11-10 DIAGNOSIS — K8020 Calculus of gallbladder without cholecystitis without obstruction: Secondary | ICD-10-CM | POA: Diagnosis not present

## 2018-11-10 DIAGNOSIS — K529 Noninfective gastroenteritis and colitis, unspecified: Secondary | ICD-10-CM | POA: Diagnosis not present

## 2018-11-10 DIAGNOSIS — D62 Acute posthemorrhagic anemia: Secondary | ICD-10-CM | POA: Diagnosis not present

## 2018-11-10 DIAGNOSIS — I69398 Other sequelae of cerebral infarction: Secondary | ICD-10-CM | POA: Diagnosis not present

## 2018-11-10 DIAGNOSIS — I10 Essential (primary) hypertension: Secondary | ICD-10-CM | POA: Diagnosis not present

## 2018-11-10 DIAGNOSIS — R413 Other amnesia: Secondary | ICD-10-CM | POA: Diagnosis not present

## 2018-11-10 DIAGNOSIS — K2970 Gastritis, unspecified, without bleeding: Secondary | ICD-10-CM | POA: Diagnosis not present

## 2018-11-10 DIAGNOSIS — D72819 Decreased white blood cell count, unspecified: Secondary | ICD-10-CM | POA: Diagnosis not present

## 2018-11-10 DIAGNOSIS — Z7901 Long term (current) use of anticoagulants: Secondary | ICD-10-CM | POA: Diagnosis not present

## 2018-11-10 DIAGNOSIS — D72820 Lymphocytosis (symptomatic): Secondary | ICD-10-CM | POA: Diagnosis not present

## 2018-11-10 DIAGNOSIS — E46 Unspecified protein-calorie malnutrition: Secondary | ICD-10-CM | POA: Diagnosis not present

## 2018-11-10 DIAGNOSIS — Z7409 Other reduced mobility: Secondary | ICD-10-CM | POA: Diagnosis not present

## 2018-11-10 DIAGNOSIS — R2689 Other abnormalities of gait and mobility: Secondary | ICD-10-CM | POA: Diagnosis not present

## 2018-11-10 DIAGNOSIS — I69391 Dysphagia following cerebral infarction: Secondary | ICD-10-CM | POA: Diagnosis not present

## 2018-11-16 DIAGNOSIS — E119 Type 2 diabetes mellitus without complications: Secondary | ICD-10-CM | POA: Diagnosis not present

## 2018-11-16 DIAGNOSIS — I69398 Other sequelae of cerebral infarction: Secondary | ICD-10-CM | POA: Diagnosis not present

## 2018-11-16 DIAGNOSIS — I82403 Acute embolism and thrombosis of unspecified deep veins of lower extremity, bilateral: Secondary | ICD-10-CM | POA: Diagnosis not present

## 2018-11-16 DIAGNOSIS — R569 Unspecified convulsions: Secondary | ICD-10-CM | POA: Diagnosis not present

## 2018-11-16 DIAGNOSIS — R413 Other amnesia: Secondary | ICD-10-CM | POA: Diagnosis not present

## 2018-11-16 DIAGNOSIS — E46 Unspecified protein-calorie malnutrition: Secondary | ICD-10-CM | POA: Diagnosis not present

## 2018-11-16 DIAGNOSIS — R1310 Dysphagia, unspecified: Secondary | ICD-10-CM | POA: Diagnosis not present

## 2018-11-16 DIAGNOSIS — K529 Noninfective gastroenteritis and colitis, unspecified: Secondary | ICD-10-CM | POA: Diagnosis not present

## 2018-11-16 DIAGNOSIS — D72820 Lymphocytosis (symptomatic): Secondary | ICD-10-CM | POA: Diagnosis not present

## 2018-11-16 DIAGNOSIS — D62 Acute posthemorrhagic anemia: Secondary | ICD-10-CM | POA: Diagnosis not present

## 2018-11-16 DIAGNOSIS — I69391 Dysphagia following cerebral infarction: Secondary | ICD-10-CM | POA: Diagnosis not present

## 2018-11-16 DIAGNOSIS — Z87891 Personal history of nicotine dependence: Secondary | ICD-10-CM | POA: Diagnosis not present

## 2018-11-16 DIAGNOSIS — Z7901 Long term (current) use of anticoagulants: Secondary | ICD-10-CM | POA: Diagnosis not present

## 2018-11-16 DIAGNOSIS — R2689 Other abnormalities of gait and mobility: Secondary | ICD-10-CM | POA: Diagnosis not present

## 2018-11-16 DIAGNOSIS — Z7409 Other reduced mobility: Secondary | ICD-10-CM | POA: Diagnosis not present

## 2018-11-16 DIAGNOSIS — I10 Essential (primary) hypertension: Secondary | ICD-10-CM | POA: Diagnosis not present

## 2018-11-16 DIAGNOSIS — D72819 Decreased white blood cell count, unspecified: Secondary | ICD-10-CM | POA: Diagnosis not present

## 2018-11-17 DIAGNOSIS — E46 Unspecified protein-calorie malnutrition: Secondary | ICD-10-CM | POA: Diagnosis not present

## 2018-11-17 DIAGNOSIS — R1310 Dysphagia, unspecified: Secondary | ICD-10-CM | POA: Diagnosis not present

## 2018-11-17 DIAGNOSIS — I639 Cerebral infarction, unspecified: Secondary | ICD-10-CM | POA: Diagnosis not present

## 2018-11-17 DIAGNOSIS — D62 Acute posthemorrhagic anemia: Secondary | ICD-10-CM | POA: Diagnosis not present

## 2018-11-17 DIAGNOSIS — R2689 Other abnormalities of gait and mobility: Secondary | ICD-10-CM | POA: Diagnosis not present

## 2018-11-17 DIAGNOSIS — I82403 Acute embolism and thrombosis of unspecified deep veins of lower extremity, bilateral: Secondary | ICD-10-CM | POA: Diagnosis not present

## 2018-11-17 DIAGNOSIS — K529 Noninfective gastroenteritis and colitis, unspecified: Secondary | ICD-10-CM | POA: Diagnosis not present

## 2018-11-17 DIAGNOSIS — Z87891 Personal history of nicotine dependence: Secondary | ICD-10-CM | POA: Diagnosis not present

## 2018-11-17 DIAGNOSIS — Z7901 Long term (current) use of anticoagulants: Secondary | ICD-10-CM | POA: Diagnosis not present

## 2018-11-17 DIAGNOSIS — D72820 Lymphocytosis (symptomatic): Secondary | ICD-10-CM | POA: Diagnosis not present

## 2018-11-17 DIAGNOSIS — R413 Other amnesia: Secondary | ICD-10-CM | POA: Diagnosis not present

## 2018-11-17 DIAGNOSIS — E119 Type 2 diabetes mellitus without complications: Secondary | ICD-10-CM | POA: Diagnosis not present

## 2018-11-17 DIAGNOSIS — R569 Unspecified convulsions: Secondary | ICD-10-CM | POA: Diagnosis not present

## 2018-11-17 DIAGNOSIS — I69398 Other sequelae of cerebral infarction: Secondary | ICD-10-CM | POA: Diagnosis not present

## 2018-11-17 DIAGNOSIS — I69391 Dysphagia following cerebral infarction: Secondary | ICD-10-CM | POA: Diagnosis not present

## 2018-11-17 DIAGNOSIS — L89154 Pressure ulcer of sacral region, stage 4: Secondary | ICD-10-CM | POA: Diagnosis not present

## 2018-11-17 DIAGNOSIS — I10 Essential (primary) hypertension: Secondary | ICD-10-CM | POA: Diagnosis not present

## 2018-11-17 DIAGNOSIS — Z7409 Other reduced mobility: Secondary | ICD-10-CM | POA: Diagnosis not present

## 2018-11-17 DIAGNOSIS — D72819 Decreased white blood cell count, unspecified: Secondary | ICD-10-CM | POA: Diagnosis not present

## 2018-11-23 DIAGNOSIS — D72819 Decreased white blood cell count, unspecified: Secondary | ICD-10-CM | POA: Diagnosis not present

## 2018-11-23 DIAGNOSIS — I10 Essential (primary) hypertension: Secondary | ICD-10-CM | POA: Diagnosis not present

## 2018-11-23 DIAGNOSIS — R569 Unspecified convulsions: Secondary | ICD-10-CM | POA: Diagnosis not present

## 2018-11-23 DIAGNOSIS — E119 Type 2 diabetes mellitus without complications: Secondary | ICD-10-CM | POA: Diagnosis not present

## 2018-11-23 DIAGNOSIS — R413 Other amnesia: Secondary | ICD-10-CM | POA: Diagnosis not present

## 2018-11-23 DIAGNOSIS — Z87891 Personal history of nicotine dependence: Secondary | ICD-10-CM | POA: Diagnosis not present

## 2018-11-23 DIAGNOSIS — R2689 Other abnormalities of gait and mobility: Secondary | ICD-10-CM | POA: Diagnosis not present

## 2018-11-23 DIAGNOSIS — I82403 Acute embolism and thrombosis of unspecified deep veins of lower extremity, bilateral: Secondary | ICD-10-CM | POA: Diagnosis not present

## 2018-11-23 DIAGNOSIS — I69391 Dysphagia following cerebral infarction: Secondary | ICD-10-CM | POA: Diagnosis not present

## 2018-11-23 DIAGNOSIS — D72820 Lymphocytosis (symptomatic): Secondary | ICD-10-CM | POA: Diagnosis not present

## 2018-11-23 DIAGNOSIS — E46 Unspecified protein-calorie malnutrition: Secondary | ICD-10-CM | POA: Diagnosis not present

## 2018-11-23 DIAGNOSIS — K529 Noninfective gastroenteritis and colitis, unspecified: Secondary | ICD-10-CM | POA: Diagnosis not present

## 2018-11-23 DIAGNOSIS — D62 Acute posthemorrhagic anemia: Secondary | ICD-10-CM | POA: Diagnosis not present

## 2018-11-23 DIAGNOSIS — Z7409 Other reduced mobility: Secondary | ICD-10-CM | POA: Diagnosis not present

## 2018-11-23 DIAGNOSIS — I69398 Other sequelae of cerebral infarction: Secondary | ICD-10-CM | POA: Diagnosis not present

## 2018-11-23 DIAGNOSIS — R1310 Dysphagia, unspecified: Secondary | ICD-10-CM | POA: Diagnosis not present

## 2018-11-23 DIAGNOSIS — Z7901 Long term (current) use of anticoagulants: Secondary | ICD-10-CM | POA: Diagnosis not present

## 2018-11-30 DIAGNOSIS — T8189XA Other complications of procedures, not elsewhere classified, initial encounter: Secondary | ICD-10-CM | POA: Diagnosis not present

## 2018-11-30 DIAGNOSIS — L89154 Pressure ulcer of sacral region, stage 4: Secondary | ICD-10-CM | POA: Diagnosis not present

## 2018-11-30 DIAGNOSIS — I639 Cerebral infarction, unspecified: Secondary | ICD-10-CM | POA: Diagnosis not present

## 2018-12-01 ENCOUNTER — Telehealth: Payer: Self-pay

## 2018-12-01 DIAGNOSIS — I69398 Other sequelae of cerebral infarction: Secondary | ICD-10-CM | POA: Diagnosis not present

## 2018-12-01 DIAGNOSIS — Z7901 Long term (current) use of anticoagulants: Secondary | ICD-10-CM | POA: Diagnosis not present

## 2018-12-01 DIAGNOSIS — I69351 Hemiplegia and hemiparesis following cerebral infarction affecting right dominant side: Secondary | ICD-10-CM

## 2018-12-01 DIAGNOSIS — Z7409 Other reduced mobility: Secondary | ICD-10-CM | POA: Diagnosis not present

## 2018-12-01 DIAGNOSIS — D62 Acute posthemorrhagic anemia: Secondary | ICD-10-CM | POA: Diagnosis not present

## 2018-12-01 DIAGNOSIS — E119 Type 2 diabetes mellitus without complications: Secondary | ICD-10-CM | POA: Diagnosis not present

## 2018-12-01 DIAGNOSIS — R1310 Dysphagia, unspecified: Secondary | ICD-10-CM | POA: Diagnosis not present

## 2018-12-01 DIAGNOSIS — K529 Noninfective gastroenteritis and colitis, unspecified: Secondary | ICD-10-CM | POA: Diagnosis not present

## 2018-12-01 DIAGNOSIS — E46 Unspecified protein-calorie malnutrition: Secondary | ICD-10-CM | POA: Diagnosis not present

## 2018-12-01 DIAGNOSIS — I10 Essential (primary) hypertension: Secondary | ICD-10-CM | POA: Diagnosis not present

## 2018-12-01 DIAGNOSIS — D72819 Decreased white blood cell count, unspecified: Secondary | ICD-10-CM | POA: Diagnosis not present

## 2018-12-01 DIAGNOSIS — I69391 Dysphagia following cerebral infarction: Secondary | ICD-10-CM | POA: Diagnosis not present

## 2018-12-01 DIAGNOSIS — R2689 Other abnormalities of gait and mobility: Secondary | ICD-10-CM | POA: Diagnosis not present

## 2018-12-01 DIAGNOSIS — R413 Other amnesia: Secondary | ICD-10-CM | POA: Diagnosis not present

## 2018-12-01 DIAGNOSIS — I82403 Acute embolism and thrombosis of unspecified deep veins of lower extremity, bilateral: Secondary | ICD-10-CM | POA: Diagnosis not present

## 2018-12-01 DIAGNOSIS — D72820 Lymphocytosis (symptomatic): Secondary | ICD-10-CM | POA: Diagnosis not present

## 2018-12-01 DIAGNOSIS — I63412 Cerebral infarction due to embolism of left middle cerebral artery: Secondary | ICD-10-CM

## 2018-12-01 DIAGNOSIS — Z87891 Personal history of nicotine dependence: Secondary | ICD-10-CM | POA: Diagnosis not present

## 2018-12-01 DIAGNOSIS — R569 Unspecified convulsions: Secondary | ICD-10-CM | POA: Diagnosis not present

## 2018-12-01 NOTE — Telephone Encounter (Signed)
Out patient referrals made and sent

## 2018-12-01 NOTE — Telephone Encounter (Signed)
May order outpt PT and OT 

## 2018-12-01 NOTE — Telephone Encounter (Signed)
[name] OT [facility] called stating that this patient is having there final visits with home health therapies and is needing a referral for OUT-patient PT and OT

## 2018-12-02 DIAGNOSIS — R413 Other amnesia: Secondary | ICD-10-CM | POA: Diagnosis not present

## 2018-12-02 DIAGNOSIS — K529 Noninfective gastroenteritis and colitis, unspecified: Secondary | ICD-10-CM | POA: Diagnosis not present

## 2018-12-02 DIAGNOSIS — R2689 Other abnormalities of gait and mobility: Secondary | ICD-10-CM | POA: Diagnosis not present

## 2018-12-02 DIAGNOSIS — E119 Type 2 diabetes mellitus without complications: Secondary | ICD-10-CM | POA: Diagnosis not present

## 2018-12-02 DIAGNOSIS — Z7901 Long term (current) use of anticoagulants: Secondary | ICD-10-CM | POA: Diagnosis not present

## 2018-12-02 DIAGNOSIS — I69398 Other sequelae of cerebral infarction: Secondary | ICD-10-CM | POA: Diagnosis not present

## 2018-12-02 DIAGNOSIS — Z7409 Other reduced mobility: Secondary | ICD-10-CM | POA: Diagnosis not present

## 2018-12-02 DIAGNOSIS — R1310 Dysphagia, unspecified: Secondary | ICD-10-CM | POA: Diagnosis not present

## 2018-12-02 DIAGNOSIS — Z87891 Personal history of nicotine dependence: Secondary | ICD-10-CM | POA: Diagnosis not present

## 2018-12-02 DIAGNOSIS — D62 Acute posthemorrhagic anemia: Secondary | ICD-10-CM | POA: Diagnosis not present

## 2018-12-02 DIAGNOSIS — R569 Unspecified convulsions: Secondary | ICD-10-CM | POA: Diagnosis not present

## 2018-12-02 DIAGNOSIS — I69391 Dysphagia following cerebral infarction: Secondary | ICD-10-CM | POA: Diagnosis not present

## 2018-12-02 DIAGNOSIS — I82403 Acute embolism and thrombosis of unspecified deep veins of lower extremity, bilateral: Secondary | ICD-10-CM | POA: Diagnosis not present

## 2018-12-02 DIAGNOSIS — I10 Essential (primary) hypertension: Secondary | ICD-10-CM | POA: Diagnosis not present

## 2018-12-02 DIAGNOSIS — D72820 Lymphocytosis (symptomatic): Secondary | ICD-10-CM | POA: Diagnosis not present

## 2018-12-02 DIAGNOSIS — E46 Unspecified protein-calorie malnutrition: Secondary | ICD-10-CM | POA: Diagnosis not present

## 2018-12-02 DIAGNOSIS — D72819 Decreased white blood cell count, unspecified: Secondary | ICD-10-CM | POA: Diagnosis not present

## 2018-12-03 ENCOUNTER — Other Ambulatory Visit: Payer: Self-pay | Admitting: Neurology

## 2018-12-06 ENCOUNTER — Other Ambulatory Visit: Payer: Self-pay

## 2018-12-06 MED ORDER — LEVETIRACETAM 500 MG PO TABS: 500.0000 mg | ORAL_TABLET | Freq: Two times a day (BID) | ORAL | 1 refills | Status: DC

## 2018-12-07 ENCOUNTER — Other Ambulatory Visit: Payer: Self-pay

## 2018-12-07 ENCOUNTER — Ambulatory Visit (INDEPENDENT_AMBULATORY_CARE_PROVIDER_SITE_OTHER): Payer: Medicare Other | Admitting: Adult Health

## 2018-12-07 ENCOUNTER — Encounter: Payer: Self-pay | Admitting: Adult Health

## 2018-12-07 VITALS — BP 137/83 | HR 58 | Temp 97.7°F | Ht 62.0 in | Wt 140.6 lb

## 2018-12-07 DIAGNOSIS — R269 Unspecified abnormalities of gait and mobility: Secondary | ICD-10-CM | POA: Diagnosis not present

## 2018-12-07 DIAGNOSIS — I639 Cerebral infarction, unspecified: Secondary | ICD-10-CM | POA: Diagnosis not present

## 2018-12-07 DIAGNOSIS — I10 Essential (primary) hypertension: Secondary | ICD-10-CM

## 2018-12-07 DIAGNOSIS — I82453 Acute embolism and thrombosis of peroneal vein, bilateral: Secondary | ICD-10-CM | POA: Diagnosis not present

## 2018-12-07 NOTE — Patient Instructions (Signed)
Continue Eliquis (apixaban) daily for secondary stroke prevention  We will repeat lower extremity ultrasound to assess for resolution of previously found clots and if resolved, you can discontinue Eliquis and start aspirin 81 mg daily for secondary stroke prevention  Contact neuro rehab office to start outpatient physical and occupational therapy [phone]  Continue Keppra for seizure prevention  Continue to follow up with PCP regarding cholesterol and blood pressure management   Continue to monitor blood pressure at home   Maintain strict control of hypertension with blood pressure goal below 130/90, diabetes with hemoglobin A1c goal below 6.5% and cholesterol with LDL cholesterol (bad cholesterol) goal below 70 mg/dL. I also advised the patient to eat a healthy diet with plenty of whole grains, cereals, fruits and vegetables, exercise regularly and maintain ideal body weight.  Followup in the future with me in 6 months or call earlier if needed       Thank you for coming to see [place] at [facility]. I hope we have been able to provide you high quality care today.  You may receive a patient satisfaction survey over the next few weeks. We would appreciate your feedback and comments so that we may continue to improve ourselves and the health of our patients.

## 2018-12-07 NOTE — Progress Notes (Signed)
[facility] [address] [place]. [place] [address] [phone]       OFFICE FOLLOW UP NOTE  Maria Avila Date of Birth:  Jan 13, 1951 Medical Record Number:  [id number]   Referring MD: Maria Avila Reason for Referral: Strokes  Chief Complaint  Patient presents with  . Follow-up    6 mon f/u. Husband present. Treatment room. Patient would like to discuss outpatient therapy.     HPI:   Interval history 12/07/2018: [name] is a 68 year old female who is being seen today for multiple embolic infarcts in 19/5093 with residual left hemiparesis and balance difficulty.  She is accompanied today by her husband.  She continues to experience balance difficulties and mild residual left hemiparesis but overall feels as though she has been recovering with improvement since prior visit.  She has recently completed home health PT/OT and plans on starting outpatient PT/OT.  She is awaiting at this time for appointments to be scheduled.  Continues to use RW without recent falls.  She has continued on Eliquis for prior finding of DVT despite recommendation of 56-month duration.  Denies bleeding or bruising with continuation of Eliquis.  She continues on Keppra which she is tolerating well without recurrent seizure activity.  Blood pressure 137/83. She was having monitored at home by therapy - typically 120s.  No further concerns at this time.  Denies new or worsening stroke/TIA symptoms.  Initial visit 06/15/2018  PS: [name] is a 68 year old African-American lady seen today for initial office consultation visit.  She is accompanied by her husband.  History is obtained from them and review of electronic medical records.  I personally reviewed imaging films.  [name] has a past medical history of malnutrition, hypertension, diabetes, colitis she apparently presented in septic shock from skilled nursing facility with respiratory failure and chest CT showing free air and fluid.  She  had recently undergone endoscopy and had concern for esophageal rupture which may have caused empyema.  Initial CT scan of the head on admission did not show an acute stroke.  She was subsequently extubated and noticed as not moving the right side and having left gaze preference.  NIH stroke scale was 20. Repeat CT scan showed subacute infarct in left parietal lobe.  Neurology was consulted and subsequently MRI scan of the brain was obtained which showed small left parietal, left MCA/ACA watershed, right parietal and pontine punctate infarcts which were felt to be of embolic etiology.  CT angiogram of the brain and neck did not show any significant large vessel stenosis or occlusion.  Interestingly the patient previously had an MRI scan of the brain on 02/16/2018 which did not show an infarct but follow-up MRI on 03/10/2018 showed above-mentioned infarcts.  Lower extremity venous Dopplers showed bilateral deep vein thrombosis.  Transcranial Doppler bubble study was negative for PFO.  TEE could not be done because of fear of questionable esophageal perforation.  2D echo showed normal ejection fraction.  LDL cholesterol was 62 mg percent and hemoglobin A1c was 5.1.  Patient was initially started on IV heparin for few days and subsequently switched to Eliquis.  She was transferred for rehab.  Patient has made gradual recovery.  She is currently living at home with her husband.  She is just finished home physical and occupational therapy.  She still needs 1 person assist to get up and is able to walk with a walker with her husband holding onto her.  The family were recommended outpatient therapy  but they are thinking about it because of cost of co-pay.  The patient states her speech is much better.  Her mind is also improving though occasionally she gets confused.  She has obtained good improvement in the strength on the left side though she still has mild weakness in her legs.  She is tolerating Eliquis well without  bleeding or bruising.  She remains on Keppra which is tolerating well.  She had 2 EEGs on the hospitalization both of which had done shown generalized slowing but she was started on Keppra for questionable seizures.  She has not had any further breakthrough seizures  ROS:   14 system review of systems is positive for weakness, balance difficulties and all systems negative  PMH:  Past Medical History:  Diagnosis Date  . Colitis   . Diabetes mellitus without complication ([place])   . Hypertension   . Malnutrition ([place])   . Stroke [facility])     Social History:  Social History   Socioeconomic History  . Marital status: Married    Spouse name: Not on file  . Number of children: Not on file  . Years of education: Not on file  . Highest education level: Not on file  Occupational History  . Not on file  Social Needs  . Financial resource strain: Not on file  . Food insecurity    Worry: Not on file    Inability: Not on file  . Transportation needs    Medical: Not on file    Non-medical: Not on file  Tobacco Use  . Smoking status: Former Research scientist (life sciences)  . Smokeless tobacco: Never Used  Substance and Sexual Activity  . Alcohol use: Not Currently  . Drug use: Not Currently  . Sexual activity: Not on file  Lifestyle  . Physical activity    Days per week: Not on file    Minutes per session: Not on file  . Stress: Not on file  Relationships  . Social Herbalist on phone: Not on file    Gets together: Not on file    Attends religious service: Not on file    Active member of club or organization: Not on file    Attends meetings of clubs or organizations: Not on file    Relationship status: Not on file  . Intimate partner violence    Fear of current or ex partner: Not on file    Emotionally abused: Not on file    Physically abused: Not on file    Forced sexual activity: Not on file  Other Topics Concern  . Not on file  Social History Narrative  . Not on file    Medications:    Current Outpatient Medications on File Prior to Visit  Medication Sig Dispense Refill  . acetaminophen (TYLENOL) 325 MG tablet Take 1-2 tablets (325-650 mg total) by mouth every 4 (four) hours as needed for mild pain.    Marland Kitchen apixaban (ELIQUIS) 5 MG TABS tablet Take 1 tablet (5 mg total) by mouth 2 (two) times daily. 60 tablet 0  . bethanechol (URECHOLINE) 10 MG tablet Take 1 tablet (10 mg total) by mouth 3 (three) times daily. 90 tablet 0  . collagenase (SANTYL) ointment Apply topically daily. To ulcer then cover with damp to dry dressing. 90 g 4  . CVS VITAMIN C 500 MG tablet TAKE 1 TABLET BY MOUTH TWICE A DAY 100 tablet 0  . hydrochlorothiazide (HYDRODIURIL) 12.5 MG tablet Take 12.5 mg by mouth  daily.    . Hydrocortisone (GERHARDT'S BUTT CREAM) CREA Apply 1 application topically daily. 1 each 0  . lactobacillus acidophilus & bulgar (LACTINEX) chewable tablet Chew 1 tablet by mouth 3 (three) times daily with meals. 90 tablet 1  . levETIRAcetam (KEPPRA) 500 MG tablet Take 1 tablet (500 mg total) by mouth 2 (two) times daily. 180 tablet 1  . lidocaine (LIDODERM) 5 % Place 1 patch onto the skin daily. Remove & Discard patch within 12 hours or as directed by MD 10 patch 0  . Multiple Vitamin (MULTIVITAMIN) capsule Take 1 capsule by mouth daily.    . pantoprazole (PROTONIX) 40 MG tablet Take 40 mg by mouth 2 (two) times daily.    . polycarbophil (FIBERCON) 625 MG tablet Take 2 tablets (1,250 mg total) by mouth 2 (two) times daily. 120 tablet 0  . tamsulosin (FLOMAX) 0.4 MG CAPS capsule Take 1 capsule (0.4 mg total) by mouth daily after supper. 30 capsule 0  . thiamine (VITAMIN B-1) 100 MG tablet TAKE 1 TABLET BY MOUTH EVERY DAY 30 tablet 0   No current facility-administered medications on file prior to visit.     Allergies:   Allergies  Allergen Reactions  . Crestor [Rosuvastatin Calcium] Swelling and Other (See Comments)    Swelling of legs and muscle weakness  . Zocor [Simvastatin] Swelling  and Other (See Comments)    Swelling of legs and muscle weakness    Physical Exam General: Frail middle-aged African-American lady seated, in no evident distress Head: head normocephalic and atraumatic.   Neck: supple with no carotid or supraclavicular bruits Cardiovascular: regular rate and rhythm, no murmurs Musculoskeletal: no deformity Skin:  no rash/petichiae  Vascular:  Normal pulses all extremities  Neurologic Exam Mental Status: Awake and fully alert. Oriented to place and time. Recent and remote memory intact. Attention span, concentration and fund of knowledge appropriate. Mood and affect appropriate.  Cranial Nerves: Pupils equal, briskly reactive to light. Extraocular movements full without nystagmus. Visual fields full to confrontation. Hearing intact. Facial sensation intact.  Mild left lower facial weakness, tongue, palate moves normally and symmetrically.  Motor: Normal bulk and tone. Mild left grip weakness.  Diminished fine finger movements on the left.  Orbits right over left upper extremity.  Mild weakness of hip flexors and ankle dorsiflexors L > R.  Sensory.: intact to touch , pinprick , position and vibratory sensation.  Coordination: Rapid alternating movements normal in all extremities. Finger-to-nose and heel-to-shin performed accurately bilat    Gait: Stance is slightly hunched.  Stands from seated position without assistance and mild difficulty.  Ambulates with rolling walker without difficulty Reflexes: 2+ brisk and symmetric. Toes downgoing.      ASSESSMENT: 68 year old African-American lady with bilateral embolic strokes in November 2019 of cryptogenic etiology in the setting of sepsis.  Evidence of bilateral lower extremity DVT during admission with initiation of Eliquis.  Residual mild left hemiparesis and gait difficulties.  She does endorse overall improvement since prior visit without any new or worsening stroke/TIA symptoms.     PLAN: -Continue  Eliquis 5 mg twice daily at this time but will reevaluate prior DVTs with lower extremity ultrasound and if resolved, recommend discontinuing Eliquis and start aspirin 81 mg daily for secondary stroke prevention -Continue to follow with PCP for HTN management -Continue Keppra 500 mg twice daily for seizure prophylaxis -Neuro rehab clinic number provided to patient and husband to call to schedule initial evaluation with outpatient PT/OT for ongoing deficits -maintain strict  control of hypertension with blood pressure goal below 130/90, diabetes with hemoglobin A1c goal below 6.5% and lipids with LDL cholesterol goal below 70 mg/dL.  Follow-up in 6 months or call earlier if needed  Greater than 50% time during this 25-minute visit was spent on counseling and coordination of care about her multiple embolic strokes and answering questions regarding residual deficits and ongoing management  Venancio Poisson, AGNP-BC  [facility] [address] [place], [place] [address]  Phone [phone] Fax [phone] Note: This document was prepared with digital dictation and possible smart phrase technology. Any transcriptional errors that result from this process are unintentional.

## 2018-12-09 DIAGNOSIS — I639 Cerebral infarction, unspecified: Secondary | ICD-10-CM | POA: Diagnosis not present

## 2018-12-13 NOTE — Progress Notes (Signed)
I agree with the above plan 

## 2018-12-16 ENCOUNTER — Telehealth: Payer: Self-pay | Admitting: *Deleted

## 2018-12-16 ENCOUNTER — Ambulatory Visit (HOSPITAL_COMMUNITY)
Admission: RE | Admit: 2018-12-16 | Discharge: 2018-12-16 | Disposition: A | Discharge status: Home / Self Care | Payer: Medicare Other | Source: Ambulatory Visit | Attending: Adult Health | Admitting: Adult Health

## 2018-12-16 ENCOUNTER — Other Ambulatory Visit: Payer: Self-pay

## 2018-12-16 DIAGNOSIS — I82453 Acute embolism and thrombosis of peroneal vein, bilateral: Secondary | ICD-10-CM | POA: Insufficient documentation

## 2018-12-16 NOTE — Telephone Encounter (Signed)
Noted! Thank you

## 2018-12-16 NOTE — Progress Notes (Signed)
LE venous duplex       has been completed. Preliminary results can be found under CV proc through chart review. June Leap, BS, RDMS, RVT     Called results to [facility]

## 2018-12-16 NOTE — Telephone Encounter (Signed)
Spoke to [place] from [facility] to f/u on calf DVT from a year ago on pt.  They just finished testing:    R Clear, L peroneal is chronic (no blood thinners needed, no travel restrictions).

## 2018-12-18 DIAGNOSIS — L89154 Pressure ulcer of sacral region, stage 4: Secondary | ICD-10-CM | POA: Diagnosis not present

## 2018-12-18 DIAGNOSIS — I639 Cerebral infarction, unspecified: Secondary | ICD-10-CM | POA: Diagnosis not present

## 2018-12-31 DIAGNOSIS — I639 Cerebral infarction, unspecified: Secondary | ICD-10-CM | POA: Diagnosis not present

## 2018-12-31 DIAGNOSIS — T8189XA Other complications of procedures, not elsewhere classified, initial encounter: Secondary | ICD-10-CM | POA: Diagnosis not present

## 2018-12-31 DIAGNOSIS — L89154 Pressure ulcer of sacral region, stage 4: Secondary | ICD-10-CM | POA: Diagnosis not present

## 2019-01-05 ENCOUNTER — Other Ambulatory Visit: Payer: Self-pay

## 2019-01-05 ENCOUNTER — Encounter: Payer: Self-pay | Admitting: Physical Therapy

## 2019-01-05 ENCOUNTER — Encounter: Payer: Self-pay | Admitting: Occupational Therapy

## 2019-01-05 ENCOUNTER — Ambulatory Visit: Payer: Medicare Other | Attending: Physical Medicine & Rehabilitation | Admitting: Occupational Therapy

## 2019-01-05 ENCOUNTER — Ambulatory Visit: Payer: Medicare Other | Admitting: Physical Therapy

## 2019-01-05 DIAGNOSIS — R2689 Other abnormalities of gait and mobility: Secondary | ICD-10-CM | POA: Insufficient documentation

## 2019-01-05 DIAGNOSIS — I69351 Hemiplegia and hemiparesis following cerebral infarction affecting right dominant side: Secondary | ICD-10-CM | POA: Insufficient documentation

## 2019-01-05 DIAGNOSIS — M25612 Stiffness of left shoulder, not elsewhere classified: Secondary | ICD-10-CM | POA: Insufficient documentation

## 2019-01-05 DIAGNOSIS — R278 Other lack of coordination: Secondary | ICD-10-CM | POA: Insufficient documentation

## 2019-01-05 DIAGNOSIS — M79662 Pain in left lower leg: Secondary | ICD-10-CM | POA: Diagnosis not present

## 2019-01-05 DIAGNOSIS — R2681 Unsteadiness on feet: Secondary | ICD-10-CM | POA: Diagnosis not present

## 2019-01-05 DIAGNOSIS — I69318 Other symptoms and signs involving cognitive functions following cerebral infarction: Secondary | ICD-10-CM

## 2019-01-05 DIAGNOSIS — M79661 Pain in right lower leg: Secondary | ICD-10-CM | POA: Insufficient documentation

## 2019-01-05 DIAGNOSIS — M25642 Stiffness of left hand, not elsewhere classified: Secondary | ICD-10-CM | POA: Insufficient documentation

## 2019-01-05 DIAGNOSIS — M6281 Muscle weakness (generalized): Secondary | ICD-10-CM

## 2019-01-05 DIAGNOSIS — M25641 Stiffness of right hand, not elsewhere classified: Secondary | ICD-10-CM | POA: Diagnosis not present

## 2019-01-05 NOTE — Therapy (Addendum)
[facility] [address] [place], [place], [address] Phone: [phone]   Fax:  [phone]  Occupational Therapy Evaluation  Patient Details  Name: Maria Avila MRN: [id number] Date of Birth: 1951-03-06 No data recorded  Encounter Date: 01/05/2019  OT End of Session - 01/05/19 2001    Visit Number  1    Number of Visits  13    Date for OT Re-Evaluation  04/05/19    Authorization Type  [employer] Medicare, $30 copay each OT/PT (seperate)    Authorization - Visit Number  1    Authorization - Number of Visits  10    OT Start Time  1233    OT Stop Time  1318    OT Time Calculation (min)  45 min    Activity Tolerance  Patient tolerated treatment well    Behavior During Therapy  [facility] for tasks assessed/performed       Past Medical History:  Diagnosis Date  . Colitis   . Diabetes mellitus without complication ([place])   . Hypertension   . Malnutrition ([place])   . Stroke [facility])     Past Surgical History:  Procedure Laterality Date  . BIOPSY  02/26/2018   Procedure: BIOPSY;  Surgeon: Wilford Corner, MD;  Location: [place];  Service: Endoscopy;;  . COLONOSCOPY WITH PROPOFOL N/A 02/26/2018   Procedure: COLONOSCOPY WITH PROPOFOL;  Surgeon: Wilford Corner, MD;  Location: [place];  Service: Endoscopy;  Laterality: N/A;  . ESOPHAGOGASTRODUODENOSCOPY (EGD) WITH PROPOFOL N/A 02/26/2018   Procedure: ESOPHAGOGASTRODUODENOSCOPY (EGD) WITH PROPOFOL;  Surgeon: Wilford Corner, MD;  Location: [place];  Service: Endoscopy;  Laterality: N/A;  . NO PAST SURGERIES     UNCERTAIN OF NAME OF SURGERY    There were no vitals filed for this visit.  Subjective Assessment - 01/05/19 1242    Subjective   I really need to work on my balance    Pertinent History  bilateral infarcts fall 2019.    PMH:  hx of questionable seizures, malnutrition, hypertension, diabetes, colitis, hx of heavy alcohol use, DM, HTN, hx of  empyema/respiratory failure and sepsis 10/19    Limitations  fall risk, questionable seizures    Patient Stated Goals  improve ability to use hands, open bottle, sweep    Currently in Pain?  Yes    Pain Score  6    at night   Pain Location  Leg    Pain Orientation  Left    Pain Descriptors / Indicators  Aching    Pain Type  Acute pain    Pain Onset  More than a month ago    Pain Frequency  Constant    Aggravating Factors   at night    Pain Relieving Factors  moving around, lidocaine patch    Effect of Pain on Daily Activities  OT will monitor, but will not directly address due to location        [facility] OT Assessment - 01/05/19 0001      Assessment   Medical Diagnosis  CVA    Onset Date/Surgical Date  12/01/18   outpatient OT referral   Hand Dominance  Right    Prior Therapy  inpatient rehab & HHOT      Precautions   Precautions  Fall    Precaution Comments  hx of questionable seizures      Balance Screen   Has the patient fallen in the past 6 months  No  Home  Environment   Family/patient expects to be discharged to:  Private residence    [employer]  Tub/Shower unit   with seat, performing transfer with supervision   Lives With  Spouse      Prior Function   Level 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   Leisure  shopping      ADL   Eating/Feeding  Set up   difficulty opening bottles   Grooming  Modified independent    Upper Body Bathing  Modified independent    Lower Body Bathing  Modified independent    Upper Body Dressing  --   min A for bra   Lower Body Dressing  Modified independent    Toilet Transfer  Modified independent    [place] independent    [place] Transfer  --   supervision     IADL   Prior Level of Function Light Housekeeping  independent (husband did most prior)   now only washing dishes   Prior Level of Function  Meal Prep  husband did most prior   pt has not attempted   Medication Management  Takes responsibility if medication is prepared in advance in seperate dosage    Financial Management  --   husband performs (shared prior)     Mobility   Mobility Status  Independent   with RW     Written Expression   Dominant Hand  Right    Handwriting  100% legible   per pt      Vision - History   Baseline Vision  --   wears most of the time (for distance)   Additional Comments  Pt denies visual problems at this time      Cognition   Overall Cognitive Status  Cognition to be further assessed in functional context PRN    Memory  Impaired    Memory Impairment  Decreased short term memory   per pt/husband     Sensation   Light Touch  Impaired by gross assessment    Additional Comments  Pt decr sensation in bilateral feet and hands      Coordination   Fine Motor Movements are Fluid and Coordinated  No    9 Hole Peg Test  Right;Left    Right 9 Hole Peg Test  35.63    Left 9 Hole Peg Test  40.90    Coordination  decr in-hand manipulation      ROM / Strength   AROM / PROM / Strength  AROM;PROM      AROM   Overall AROM   Deficits    Overall AROM Comments  Bilateral UEs grossly WNL except R shoulder flex 130*, L shoulder flex 110* (scaption), 85% ER/IR, R hand approx 90% gross finger flex, L hand approx 50% gross finger flexion      PROM   Overall PROM   Deficits    Overall PROM Comments  decr L shoulder PROM with pain, decr bilateral hand PROM      Hand Function   Right Hand Grip (lbs)  20.5    Left Hand Grip (lbs)  9.9                        OT Short Term Goals - 01/05/19 2020      OT SHORT TERM GOAL #1   Title  Pt will be independent with intitial HEP.--check STGs 02/23/19    Time  6    Period  Weeks    Status  New      OT SHORT TERM GOAL #2   Title  Pt will demo at least 120* L shoulder flex for functional reaching without pain.    Baseline  110* scaption     Time  6    Period  Weeks    Status  New      OT SHORT TERM GOAL #3   Title  Pt will demo at least 75% L gross finger flex for improved grasp.    Baseline  50%    Time  6    Period  Weeks    Status  New      OT SHORT TERM GOAL #4   Title  Pt will be able to sweep with supervision.    Time  6    Period  Weeks    Status  New        OT Long Term Goals - 01/05/19 2024      OT LONG TERM GOAL #1   Title  Pt will verbalize understanding of appropriate community fitness activities and updated HEP.--check LTGs 04/05/19    Time  12    Period  Weeks    Status  New      OT LONG TERM GOAL #2   Title  Pt will demo at least 125* L shoulder flex without pain for functional reaching.    Time  12    Period  Weeks    Status  New      OT LONG TERM GOAL #3   Title  Pt will improve bilateral hand coordination for ADLs as shown by decr time on 9-hole peg test by at least 5sec.    Baseline  R-35.63, L-40.90sec    Time  12    Period  Weeks      OT LONG TERM GOAL #4   Title  Pt will improve L grip strength by at least 8lbs to assist with opening containers/bottles.    Baseline  R-25.5, L-9.9lbs    Time  12    Period  Weeks    Status  New      OT LONG TERM GOAL #5   Title  Pt will be able to carry plate/cup at least 15 feet without LOB or spills.    Time  12    Period  Weeks    Status  New      OT LONG TERM GOAL #6   Title  Pt will be able to sweep mod I.    Time  12    Period  Weeks    Status  New            Plan - 01/05/19 2003    Clinical Impression Statement  Pt is a 68 y.o. female s/p bilateral CVAs.  Pt with PMH that includes:hx of questionable seizures, malnutrition, hypertension, diabetes, colitis, hx of heavy alcohol use, DM, HTN, hx of empyema/respiratory failure and sepsis 10/19.  Pt was indpendent prior to CVAs.  Pt presents today with decr strength, decr ROM, decr coordination, decr balance/functional mobiltiy, and cognitive deficits.  Pt would benefit from  occupational therapy to address these deficits for improved UE functional use and ADL/IADL performance.    OT Occupational Profile and History  Detailed Assessment- Review of Records and additional review of physical, cognitive, psychosocial history related to current  functional performance    Occupational performance deficits (Please refer to evaluation for details):  ADL's;IADL's    Body Structure / Function / Physical Skills  Mobility;ADL;Balance;Endurance;Strength;UE functional use;ROM;Dexterity;Pain;FMC;Coordination;Decreased knowledge of use of DME;IADL;Sensation    Cognitive Skills  Memory    Rehab Potential  Good    Clinical Decision Making  Several treatment options, min-mod task modification necessary    Comorbidities Affecting Occupational Performance:  May have comorbidities impacting occupational performance    Modification or Assistance to Complete Evaluation   Min-Moderate modification of tasks or assist with assess necessary to complete eval    OT Frequency  1x / week   due to financial concerns at pt/husband request   OT Duration  12 weeks   +eval   OT Treatment/Interventions  Self-care/ADL training;Therapeutic exercise;Functional Mobility Training;Manual Therapy;Splinting;Neuromuscular education;Ultrasound;Energy conservation;Therapeutic activities;Cognitive remediation/compensation;DME and/or AE instruction;Paraffin;Cryotherapy;Fluidtherapy;Patient/family education;Passive range of motion;Moist Heat    Plan  initiate coordination HEP and ROM HEP (hand and shoulder)    Consulted and Agree with Plan of Care  Patient;Family member/caregiver    Family Member Consulted  husband       Patient will benefit from skilled therapeutic intervention in order to improve the following deficits and impairments:   Body Structure / Function / Physical Skills: Mobility, ADL, Balance, Endurance, Strength, UE functional use, ROM, Dexterity, Pain, FMC, Coordination, Decreased knowledge of use of  DME, IADL, Sensation Cognitive Skills: Memory     Visit Diagnosis: Muscle weakness (generalized) - Plan: Ot plan of care cert/re-cert  Other lack of coordination - Plan: Ot plan of care cert/re-cert  Stiffness of left hand, not elsewhere classified - Plan: Ot plan of care cert/re-cert  Stiffness of right hand, not elsewhere classified - Plan: Ot plan of care cert/re-cert  Unsteadiness on feet - Plan: Ot plan of care cert/re-cert  Other abnormalities of gait and mobility - Plan: Ot plan of care cert/re-cert  Other symptoms and signs involving cognitive functions following cerebral infarction - Plan: Ot plan of care cert/re-cert  Stiffness of left shoulder, not elsewhere classified - Plan: Ot plan of care cert/re-cert    Problem List Patient Active Problem List   Diagnosis Date Noted  . Seizure prophylaxis--probable seizure 04/12/2018  . Sacral decubitus ulcer, stage IV ([place]) 04/12/2018  . Acute blood loss anemia   . Impulsive   . Hypokalemia   . ETOH abuse   . Dysphagia, post-stroke   . Benign essential HTN   . Acute deep vein thrombosis (DVT) of distal vein of both lower extremities (H[facility])   . Sinus tachycardia   . Esophageal perforation   . Perforation esophagus   . Sepsis with acute renal failure ([place])   . Acute deep vein thrombosis (DVT) of both peroneal veins   . Leukocytosis   . Sepsis due to Pseudomonas species ([place])   . Cerebral embolism with cerebral infarction 03/09/2018  . Malnutrition of moderate degree 03/08/2018  . Empyema ([place]) 03/04/2018  . Pressure injury of skin 03/04/2018  . Acute respiratory failure with hypercapnia ([place]) 03/04/2018  . Diarrhea 02/26/2018  . Pancytopenia ([place]) 02/19/2018  . Hypotension 02/19/2018  . Severe protein-calorie malnutrition ([place]) 02/17/2018  . Colitis 02/16/2018  . Weakness 02/16/2018  . Nausea vomiting and diarrhea 02/16/2018  . Diabetes ([place]) 02/16/2018  . Essential hypertension 02/16/2018  . Anemia 02/16/2018     [facility] 01/05/2019, 8:36 PM  [facility] [address] [place], [place], [address] Phone: [phone]   Fax:  [phone]  Name:  Maria Avila MRN: [id number] Date of Birth: 1950/12/31   Vianne Bulls, OTR/L [facility] [address] [place], [place]  [address] [phone] phone [phone] 01/05/19 8:36 PM

## 2019-01-05 NOTE — Therapy (Signed)
[facility] [address] [place], [place], [address] Phone: [phone]   Fax:  [phone]  Physical Therapy Evaluation  Patient Details  Name: Maria Avila MRN: [id number] Date of Birth: 1950/06/02 Referring Provider (PT): Jacobo Forest, MD   Encounter Date: 01/05/2019   CLINIC OPERATION CHANGES: Outpatient Neuro Rehab is open at lower capacity following universal masking, social distancing, and patient screening.  The patient's COVID risk of complications score is 4.   PT End of Session - 01/05/19 2158    Visit Number  1    Number of Visits  13    Date for PT Re-Evaluation  04/01/19    Authorization Type  [employer] Medicare  $30 co-pay PT & OT ea. so $60 for both    Authorization Time Period  OOP $3600  $2190.16 met  VL:MN    PT Start Time  1320    PT Stop Time  1405    PT Time Calculation (min)  45 min    Equipment Utilized During Treatment  Gait belt    Activity Tolerance  Patient tolerated treatment well;Patient limited by fatigue    Behavior During Therapy  WFL for tasks assessed/performed;Flat affect       Past Medical History:  Diagnosis Date  . Colitis   . Diabetes mellitus without complication ([place])   . Hypertension   . Malnutrition ([place])   . Stroke [facility])     Past Surgical History:  Procedure Laterality Date  . BIOPSY  02/26/2018   Procedure: BIOPSY;  Surgeon: Wilford Corner, MD;  Location: [place];  Service: Endoscopy;;  . COLONOSCOPY WITH PROPOFOL N/A 02/26/2018   Procedure: COLONOSCOPY WITH PROPOFOL;  Surgeon: Wilford Corner, MD;  Location: [facility];  Service: Endoscopy;  Laterality: N/A;  . ESOPHAGOGASTRODUODENOSCOPY (EGD) WITH PROPOFOL N/A 02/26/2018   Procedure: ESOPHAGOGASTRODUODENOSCOPY (EGD) WITH PROPOFOL;  Surgeon: Wilford Corner, MD;  Location: [place];  Service: Endoscopy;  Laterality: N/A;  . NO PAST SURGERIES     UNCERTAIN OF NAME OF SURGERY    There were no  vitals filed for this visit.   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 Bilateral embolic strokes  with the largest infarct in the left parietal area resulting in right hemiparesis in November 2019 of cryptogenic etiology in the setting of sepsis. Hospitalized 02/16/18-02/19/18 Malnutrition moderate degree to SNF. Readmit 03/04/18 acute CVA.  Inpatient Rehab 03/17/2018 to 04/09/2018 followed by Home Health.    Patient is accompained by:  Family member   husband   Pertinent History  Bil CVAs, DM, HTN, Malnutrition    Patient Stated Goals  To walk without walker.    Currently in Pain?  Yes    Pain Score  8    in last week, worst 9/10, best 2-3/10   Pain Location  Leg    Pain Orientation  Right;Left;Lower   knee distally   Pain Descriptors / Indicators  Throbbing;Numbness;Pins and needles    Pain Type  Chronic pain    Pain Onset  More than a month ago    Pain Frequency  Constant    Aggravating Factors   sitting too much    Pain Relieving Factors  medications, moving, heat pad, lanocain patch         [facility] PT Assessment - 01/05/19 1320      Assessment  Medical Diagnosis  CVA    Referring Provider (PT)  Jacobo Forest, MD    Onset Date/Surgical Date  12/01/18   MD referral to [facility]  Right    Prior Therapy  inpatient rehab & HHPT      Precautions   Precautions  Fall      Balance Screen   Has the patient fallen in the past 6 months  No    Has the patient had a decrease in activity level because of a fear of falling?   Yes    Is the patient reluctant to leave their home because of a fear of falling?   Yes      [place] residence    Living Arrangements  Spouse/significant other    Type of [place] to enter    Entrance Stairs-Number of Steps  3    Entrance  Stairs-Rails  None    Home Layout  One level    [place] - 2 wheels;Cane - single point;Bedside commode;Shower seat;Wheelchair - manual      Prior Function   Level of Independence  Independent;Independent with household mobility without device;Independent with community mobility without device    Vocation  Retired    Leisure  shopping,       Posture/Postural Control   Posture/Postural Control  Postural limitations    Postural Limitations  Rounded Shoulders;Forward head;Flexed trunk      ROM / Strength   AROM / PROM / Strength  AROM;Strength      AROM   Overall AROM   Deficits    Overall AROM Comments  standing appears to have hip flexion, knee flexion & ankle gastroc tightness bilaterally      Strength   Overall Strength  Deficits    Strength Assessment Site  Hip;Knee;Ankle    Right Hip Flexion  4/5    Right Hip Extension  3/5   tested grossly in sitting & standing   Right Hip ABduction  3/5   tested grossly in sitting & standing   Left Hip Flexion  4/5    Left Hip Extension  3/5   tested grossly in sitting & standing   Left Hip ABduction  3/5   tested grossly in sitting & standing   Right Knee Flexion  4/5   tested grossly in sitting & standing   Right Knee Extension  4/5    Left Knee Flexion  4/5   tested grossly in sitting & standing   Left Knee Extension  4/5    Right Ankle Dorsiflexion  4/5    Right Ankle Plantar Flexion  3-/5   tested grossly in sitting & standing   Left Ankle Dorsiflexion  4/5    Left Ankle Plantar Flexion  3-/5   tested grossly in sitting & standing     Transfers   Transfers  Sit to Stand;Stand to Sit    Sit to Stand  5: Supervision;With upper extremity assist;With armrests;From chair/3-in-1    Stand to Sit  5: Supervision;With upper extremity assist;With armrests;To bed      Ambulation/Gait   Ambulation/Gait  Yes    Ambulation/Gait Assistance  5: Supervision;4: Min assist   MinA no device & supervision RW   Ambulation  Distance (Feet)  150 Feet   150' RW then 100' no device without seated rests b/w   Assistive device  Rolling walker;None  Gait Pattern  Step-through pattern;Decreased arm swing - right;Decreased stance time - right;Decreased step length - left;Decreased dorsiflexion - right;Decreased weight shift to right;Lateral hip instability;Trunk flexed;Wide base of support    Ambulation Surface  Indoor;Level    Gait velocity  1.22 ft/sec RW & 1.34 ft /sec cane    Stairs  Yes    Stairs Assistance  5: Supervision    Stair Management Technique  Two rails;Step to pattern;Forwards    Number of Stairs  4      Standardized Balance Assessment   Standardized Balance Assessment  Berg Balance Test;Timed Up and Go Test;Dynamic Gait Index      Berg Balance Test   Sit to Stand  Able to stand  independently using hands    Standing Unsupported  Able to stand 2 minutes with supervision    Sitting with Back Unsupported but Feet Supported on Floor or Stool  Able to sit safely and securely 2 minutes    Stand to Sit  Controls descent by using hands    Transfers  Able to transfer safely, definite need of hands    Standing Unsupported with Eyes Closed  Unable to keep eyes closed 3 seconds but stays steady    Standing Unsupported with Feet Together  Needs help to attain position but able to stand for 30 seconds with feet together    From Standing, Reach Forward with Outstretched Arm  Reaches forward but needs supervision    From Standing Position, Pick up Object from Floor  Able to pick up shoe, needs supervision    From Standing Position, Turn to Look Behind Over each Shoulder  Needs supervision when turning    Turn 360 Degrees  Needs assistance while turning    Standing Unsupported, Alternately Place Feet on Step/Stool  Needs assistance to keep from falling or unable to try    Standing Unsupported, One Foot in Front  Needs help to step but can hold 15 seconds    Standing on One Leg  Tries to lift leg/unable to hold 3  seconds but remains standing independently    Total Score  25      Dynamic Gait Index   Level Surface  Moderate Impairment    Change in Gait Speed  Severe Impairment    Gait with Horizontal Head Turns  Severe Impairment    Gait with Vertical Head Turns  Severe Impairment    Gait and Pivot Turn  Severe Impairment    Step Over Obstacle  Severe Impairment    Step Around Obstacles  Severe Impairment    Steps  Moderate Impairment    Total Score  2      Timed Up and Go Test   Normal TUG (seconds)  19.08   no device MinA               Objective measurements completed on examination: See above findings.              PT Education - 01/05/19 1400    Education Details  PT POC and patient request for 1x/wk.  PT recommended activating her [employer] research officer, government at [facility] to agument PT. PT will instruct in appopriate exercises but [employer] would pay for membership    Person(s) Educated  Patient;Spouse    Methods  Explanation;Verbal cues    Comprehension  Verbalized understanding       PT Short Term Goals - 01/05/19 2219      PT SHORT TERM GOAL #1   Title  Patient & husband demonstrates & verbalizes understanding of use of recumbent stepper for [employer]. (All STGs Target Date: 02/02/2019)    Time  4    Period  Weeks    Status  New    Target Date  02/02/19      PT SHORT TERM GOAL #2   Title  Patient & husband demonstrates understanding of OTAGO HEP.    Time  4    Period  Weeks    Status  New    Target Date  02/02/19        PT Long Term Goals - 01/05/19 2215      PT LONG TERM GOAL #1   Title  Patient verbalizes & demonstrates understanding of ongoing HEP including community fitness. (All LTGs Target Date: 04/01/2019)    Time  12    Period  Weeks    Status  New    Target Date  04/01/19      PT LONG TERM GOAL #2   Title  Berg Balance score >36/56 to indicate lower fall risk.    Time  12    Period  Weeks    Status  New    Target Date  04/01/19      PT LONG  TERM GOAL #3   Title  Timed Up & Go without device <13.5sec safely    Time  12    Period  Weeks    Status  New    Target Date  04/01/19      PT LONG TERM GOAL #4   Title  Patient ambulates community distances 150'  & negotiates ramps/ curbs with RW modified independent.    Time  12    Period  Weeks    Status  New    Target Date  04/01/19      PT LONG TERM GOAL #5   Title  Patient ambulates 100' around furniture without device modified independent.    Time  12    Period  Weeks    Status  New    Target Date  04/01/19             Plan - 01/05/19 2203    Clinical Impression Statement  This patient had CVA 03/04/2018 from bilateral embolus. She has weakness & limited flexibility in bilateral lower extremities impairing mobility.  Berg Balance score 25/56 indicates high fall risk & dependency in ADLs.  Timed Up & Go 19.08sec without device with minA.  Dynamic Gait Index 2/24 indicates high fall risk with gait.  Patient ambulates with RW with supervision on level indoor surfaces. She reports ambulating in home without device holding furniture. She requires minA to ambulate without device.  Patient would benefit from skilled PT to improve mobility. Due to high co-pay ($30 ea PT & $30 OT for $60/visit) patient and husband requesting 1x/wk frequency. PT recommended using Silver Sneakers at [facility] to agument PT/OT as deconditioning plays large role in limitations.    Personal Factors and Comorbidities  Age;Comorbidity 3+;Fitness;Past/Current Experience;Time since onset of injury/illness/exacerbation    Comorbidities  Bil CVAs, DM, HTN, Malnutrition    Examination-Activity Limitations  Carry;Locomotion Level;Reach Overhead;Squat;Stairs;Stand;Transfers    Examination-Participation Restrictions  Community Activity    Stability/Clinical Decision Making  Evolving/Moderate complexity    Clinical Decision Making  Moderate    Rehab Potential  Good    PT Frequency  1x / week    PT Duration  12 weeks     PT Treatment/Interventions  ADLs/Self Care Home Management;DME  Instruction;Gait training;Stair training;Functional mobility training;Therapeutic activities;Therapeutic exercise;Balance training;Neuromuscular re-education;Patient/family education;Vestibular    PT Next Visit Plan  monitor HR & O2 sat, check if she activated Silver Sneakers, instruct in use of SciFit recumbent stepper at low intensity using sets to build endurance, OTAGO HEP    Consulted and Agree with Plan of Care  Patient;Family member/caregiver       Patient will benefit from skilled therapeutic intervention in order to improve the following deficits and impairments:  Abnormal gait, Cardiopulmonary status limiting activity, Decreased activity tolerance, Decreased balance, Decreased endurance, Decreased knowledge of use of DME, Decreased mobility, Decreased range of motion, Decreased strength, Dizziness, Postural dysfunction, Pain  Visit Diagnosis: Other abnormalities of gait and mobility  Unsteadiness on feet  Muscle weakness (generalized)  Hemiplegia and hemiparesis following cerebral infarction affecting right dominant side (HCC)  Pain in right lower leg  Pain in left lower leg     Problem List Patient Active Problem List   Diagnosis Date Noted  . Seizure prophylaxis--probable seizure 04/12/2018  . Sacral decubitus ulcer, stage IV ([place]) 04/12/2018  . Acute blood loss anemia   . Impulsive   . Hypokalemia   . ETOH abuse   . Dysphagia, post-stroke   . Benign essential HTN   . Acute deep vein thrombosis (DVT) of distal vein of both lower extremities (HCC)   . Sinus tachycardia   . Esophageal perforation   . Perforation esophagus   . Sepsis with acute renal failure ([place])   . Acute deep vein thrombosis (DVT) of both peroneal veins   . Leukocytosis   . Sepsis due to Pseudomonas species ([place])   . Cerebral embolism with cerebral infarction 03/09/2018  . Malnutrition of moderate degree 03/08/2018  . Empyema  ([place]) 03/04/2018  . Pressure injury of skin 03/04/2018  . Acute respiratory failure with hypercapnia ([place]) 03/04/2018  . Diarrhea 02/26/2018  . Pancytopenia ([place]) 02/19/2018  . Hypotension 02/19/2018  . Severe protein-calorie malnutrition ([place]) 02/17/2018  . Colitis 02/16/2018  . Weakness 02/16/2018  . Nausea vomiting and diarrhea 02/16/2018  . Diabetes ([place]) 02/16/2018  . Essential hypertension 02/16/2018  . Anemia 02/16/2018    Donie Lemelin PT, DPT 01/05/2019, 10:25 PM  [facility] [address] [place], [place], [address] Phone: [phone]   Fax:  [phone]  Name: Elyssa E Avila MRN: [id number] Date of Birth: 1950-10-11

## 2019-01-09 DIAGNOSIS — I639 Cerebral infarction, unspecified: Secondary | ICD-10-CM | POA: Diagnosis not present

## 2019-01-12 ENCOUNTER — Ambulatory Visit: Payer: Medicare Other | Admitting: Physical Therapy

## 2019-01-12 ENCOUNTER — Other Ambulatory Visit: Payer: Self-pay

## 2019-01-12 ENCOUNTER — Encounter: Payer: Self-pay | Admitting: Physical Therapy

## 2019-01-12 ENCOUNTER — Ambulatory Visit: Payer: Medicare Other | Admitting: Occupational Therapy

## 2019-01-12 DIAGNOSIS — M25642 Stiffness of left hand, not elsewhere classified: Secondary | ICD-10-CM

## 2019-01-12 DIAGNOSIS — M25612 Stiffness of left shoulder, not elsewhere classified: Secondary | ICD-10-CM | POA: Diagnosis not present

## 2019-01-12 DIAGNOSIS — R2689 Other abnormalities of gait and mobility: Secondary | ICD-10-CM | POA: Diagnosis not present

## 2019-01-12 DIAGNOSIS — I69318 Other symptoms and signs involving cognitive functions following cerebral infarction: Secondary | ICD-10-CM | POA: Diagnosis not present

## 2019-01-12 DIAGNOSIS — M6281 Muscle weakness (generalized): Secondary | ICD-10-CM

## 2019-01-12 DIAGNOSIS — R2681 Unsteadiness on feet: Secondary | ICD-10-CM

## 2019-01-12 DIAGNOSIS — R278 Other lack of coordination: Secondary | ICD-10-CM | POA: Diagnosis not present

## 2019-01-12 DIAGNOSIS — M79662 Pain in left lower leg: Secondary | ICD-10-CM | POA: Diagnosis not present

## 2019-01-12 DIAGNOSIS — I69351 Hemiplegia and hemiparesis following cerebral infarction affecting right dominant side: Secondary | ICD-10-CM | POA: Diagnosis not present

## 2019-01-12 DIAGNOSIS — M79661 Pain in right lower leg: Secondary | ICD-10-CM | POA: Diagnosis not present

## 2019-01-12 DIAGNOSIS — M25641 Stiffness of right hand, not elsewhere classified: Secondary | ICD-10-CM | POA: Diagnosis not present

## 2019-01-12 NOTE — Patient Instructions (Signed)
January 12, 2019 Maria Avila has medical necessity to exercise. She needs access to [facility] to enable her to exercise. PT recommends starting with recumbent machines that utilize her arms & legs at same time to operate like a recumbent SciFit stepper. PT recommends starting with level 1.0 for 10 minutes 2 sets with 3 minute rest between sets. Once she is comfortable with this machine, she would benefit from low intensity machines & exercises to work on flexibility, strength and balance. Please contact me with any questions,  Jamey Reas, PT, DPT Phone [phone] Email: [email or web address]

## 2019-01-12 NOTE — Patient Instructions (Signed)
  Flexor Tendon Gliding (Active Hook Fist)   With fingers and knuckles straight, bend middle and tip joints. Do not bend large knuckles. Repeat _10-15___ times. Do _4-6___ sessions per day.  MP Flexion (Active)   With back of hand on table, bend large knuckles as far as they will go, keeping small joints straight. Repeat _10-15___ times. Do __4-6__ sessions per day. Activity: Reach into a narrow container.*      Finger Flexion / Extension   With palm up, bend fingers of left hand toward palm, making a  fist. Help with your right hand Straighten fingers, opening fist. Repeat sequence _10-15___ times per session. Do _4-6__ sessions per day. Hand Variation: Palm down   Copyright  VHI. All rights reserved.       AROM: Finger Flexion / Extension   Actively bend fingers of  hand. Start with knuckles furthest from palm, and slowly make a fist. Hold __5__ seconds. Relax. Then straighten fingers as far as possible. Repeat _10-15___ times per set.  Do _4-6___ sessions per day.  Copyright  VHI. All rights reserved.     1. Grip Strengthening (Resistive Putty)   Squeeze putty using thumb and all fingers. Repeat _20___ times. Do __2__ sessions per day.   2. Roll putty into tube on table and pinch between each finger and thumb x 10 reps each. (can do ring and small finger together)     Copyright  VHI. All rights reserved.         Coordination Activities  Perform the following activities for 20 minutes 1 times per day with left hand(s).   Flip cards 1 at a time as fast as you can.  Deal cards with your thumb (Hold deck in hand and push card off top with thumb).  Pick up coins and place in container or coin bank.  Pick up coins and stack.  Pick up coins one at a time until you get 5-10 in your hand, then move coins from palm to fingertips to place in container one at a time.

## 2019-01-13 NOTE — Therapy (Signed)
[facility] [address] [place], [place], [address] Phone: [phone]   Fax:  [phone]  Physical Therapy Treatment  Patient Details  Name: Maria Avila MRN: [id number] Date of Birth: 1951/02/18 Referring Provider (PT): Jacobo Forest, MD   Encounter Date: 01/12/2019   CLINIC OPERATION CHANGES: [facility] is open at lower capacity following universal masking, social distancing, and patient screening.  The patient's COVID risk of complications score is 4.   PT End of Session - 01/12/19 1246    Visit Number  2    Number of Visits  13    Date for PT Re-Evaluation  04/01/19    Authorization Type  UHC Medicare  $30 co-pay PT & OT ea. so $60 for both    Authorization Time Period  OOP $3600  $2190.16 met  VL:MN    PT Start Time  1149    PT Stop Time  1230    PT Time Calculation (min)  41 min    Equipment Utilized During Treatment  Gait belt    Activity Tolerance  Patient tolerated treatment well;Patient limited by fatigue    Behavior During Therapy  WFL for tasks assessed/performed;Flat affect       Past Medical History:  Diagnosis Date  . Colitis   . Diabetes mellitus without complication ([place])   . Hypertension   . Malnutrition ([place])   . Stroke [facility])     Past Surgical History:  Procedure Laterality Date  . BIOPSY  02/26/2018   Procedure: BIOPSY;  Surgeon: Wilford Corner, MD;  Location: [facility];  Service: Endoscopy;;  . COLONOSCOPY WITH PROPOFOL N/A 02/26/2018   Procedure: COLONOSCOPY WITH PROPOFOL;  Surgeon: Wilford Corner, MD;  Location: [facility];  Service: Endoscopy;  Laterality: N/A;  . ESOPHAGOGASTRODUODENOSCOPY (EGD) WITH PROPOFOL N/A 02/26/2018   Procedure: ESOPHAGOGASTRODUODENOSCOPY (EGD) WITH PROPOFOL;  Surgeon: Wilford Corner, MD;  Location: [facility];  Service: Endoscopy;  Laterality: N/A;  . NO PAST SURGERIES     UNCERTAIN OF NAME OF SURGERY    There were no  vitals filed for this visit.  Subjective Assessment - 01/12/19 1149    Subjective  No falls since PT evaulation.  She activated her [facility] along with husband but is waiting on PT guidance to start at [facility].    Patient is accompained by:  Family member   husband   Pertinent History  Bil CVAs, DM, HTN, Malnutrition    Patient Stated Goals  To walk without walker.    Currently in Pain?  No/denies    Pain Onset  More than a month ago                       [facility] Adult PT Treatment/Exercise - 01/12/19 1145      Knee/Hip Exercises: Aerobic   Stepper  SciFit recumbent stepper Level 1.0 with BLEs & BUEs for 10 minutes. PT verbally instructed in choice of machine rationale, set-up and recommend 10 min 2 sets initially.           Balance Exercises - 01/12/19 1145      OTAGO PROGRAM   Head Movements  Sitting;Standing;5 reps   standing with RW close & chair behind for safety   Neck Movements  Sitting;5 reps   manual / tactile cues, needs closed chain motion   Back Extension  Standing;5 reps   RW support   Trunk Movements  Standing;5 reps   RW support   Ankle  Movements  Sitting;10 reps    Knee Extensor  10 reps   no weight   Knee Flexor  10 reps   RW support, no weight   Hip ABductor  10 reps   RW support, no weight   Overall OTAGO Comments  PT wrote extra details in patient's book;  PT recommended breaking up into 3-5 exercises at time;  pt verbalized understanding.         PT Education - 01/12/19 1230    Education Details  SciFit recumbent stepper use at [employer] & initiated OTAGO HEP    Person(s) Educated  Patient    Methods  Explanation;Demonstration;Tactile cues;Verbal cues;Handout    Comprehension  Verbalized understanding;Need further instruction;Returned demonstration       PT Short Term Goals - 01/05/19 2219      PT SHORT TERM GOAL #1   Title  Patient & husband demonstrates & verbalizes understanding of use of recumbent stepper for [employer]. (All  STGs Target Date: 02/02/2019)    Time  4    Period  Weeks    Status  New    Target Date  02/02/19      PT SHORT TERM GOAL #2   Title  Patient & husband demonstrates understanding of OTAGO HEP.    Time  4    Period  Weeks    Status  New    Target Date  02/02/19        PT Long Term Goals - 01/05/19 2215      PT LONG TERM GOAL #1   Title  Patient verbalizes & demonstrates understanding of ongoing HEP including community fitness. (All LTGs Target Date: 04/04/2019)    Time  12    Period  Weeks    Status  New    Target Date  04/04/19      PT LONG TERM GOAL #2   Title  Berg Balance score >36/56 to indicate lower fall risk.    Time  12    Period  Weeks    Status  New    Target Date  04/04/19      PT LONG TERM GOAL #3   Title  Timed Up & Go without device <13.5sec safely    Time  12    Period  Weeks    Status  New    Target Date  04/04/19      PT LONG TERM GOAL #4   Title  Patient ambulates community distances 150'  & negotiates ramps/ curbs with RW modified independent.    Time  12    Period  Weeks    Status  New    Target Date  04/04/19      PT LONG TERM GOAL #5   Title  Patient ambulates 100' around furniture without device modified independent.    Time  12    Period  Weeks    Status  New    Target Date  04/04/19            Plan - 01/12/19 2100    Clinical Impression Statement  Today's session focused on introducing use of SciFit recumbent stepper so she can use at [facility] to augment therapy. Patient requested 1x/wk frequency due to high co-pay.  PT also initiated OTAGO as HEP to address strength & balance issues.    Personal Factors and Comorbidities  Age;Comorbidity 3+;Fitness;Past/Current Experience;Time since onset of injury/illness/exacerbation    Comorbidities  Bil CVAs, DM, HTN, Malnutrition    Examination-Activity Limitations  Carry;Locomotion Level;Reach Overhead;Squat;Stairs;Stand;Transfers    Examination-Participation Restrictions  Community Activity     Stability/Clinical Decision Making  Evolving/Moderate complexity    Rehab Potential  Good    PT Frequency  1x / week    PT Duration  12 weeks    PT Treatment/Interventions  ADLs/Self Care Home Management;DME Instruction;Gait training;Stair training;Functional mobility training;Therapeutic activities;Therapeutic exercise;Balance training;Neuromuscular re-education;Patient/family education;Vestibular    PT Next Visit Plan  monitor HR & O2 sat, check if she started at [facility] how it's going, review OTAGO exercises instructed & add additional exercises.    Consulted and Agree with Plan of Care  Patient    Family Member Consulted  PT briefly spoke to husband at end of session       Patient will benefit from skilled therapeutic intervention in order to improve the following deficits and impairments:  Abnormal gait, Cardiopulmonary status limiting activity, Decreased activity tolerance, Decreased balance, Decreased endurance, Decreased knowledge of use of DME, Decreased mobility, Decreased range of motion, Decreased strength, Dizziness, Postural dysfunction, Pain  Visit Diagnosis: Muscle weakness (generalized)  Hemiplegia and hemiparesis following cerebral infarction affecting right dominant side (HCC)  Other lack of coordination  Stiffness of left hand, not elsewhere classified  Other symptoms and signs involving cognitive functions following cerebral infarction  Other abnormalities of gait and mobility  Unsteadiness on feet     Problem List Patient Active Problem List   Diagnosis Date Noted  . Seizure prophylaxis--probable seizure 04/12/2018  . Sacral decubitus ulcer, stage IV ([place]) 04/12/2018  . Acute blood loss anemia   . Impulsive   . Hypokalemia   . ETOH abuse   . Dysphagia, post-stroke   . Benign essential HTN   . Acute deep vein thrombosis (DVT) of distal vein of both lower extremities (HCC)   . Sinus tachycardia   . Esophageal perforation   . Perforation esophagus    . Sepsis with acute renal failure ([place])   . Acute deep vein thrombosis (DVT) of both peroneal veins   . Leukocytosis   . Sepsis due to Pseudomonas species ([place])   . Cerebral embolism with cerebral infarction 03/09/2018  . Malnutrition of moderate degree 03/08/2018  . Empyema ([place]) 03/04/2018  . Pressure injury of skin 03/04/2018  . Acute respiratory failure with hypercapnia ([place]) 03/04/2018  . Diarrhea 02/26/2018  . Pancytopenia ([place]) 02/19/2018  . Hypotension 02/19/2018  . Severe protein-calorie malnutrition ([place]) 02/17/2018  . Colitis 02/16/2018  . Weakness 02/16/2018  . Nausea vomiting and diarrhea 02/16/2018  . Diabetes ([place]) 02/16/2018  . Essential hypertension 02/16/2018  . Anemia 02/16/2018    Rashid Whitenight PT, DPT 01/13/2019, 6:27 AM  [facility] [address] [place], [place], [address] Phone: [phone]   Fax:  [phone]  Name: Franceen E Avila MRN: [id number] Date of Birth: [birth year]

## 2019-01-13 NOTE — Therapy (Signed)
[facility] [address] [place], [place], [address] Phone: [phone]   Fax:  [phone]  Occupational Therapy Treatment  Patient Details  Name: Maria Avila MRN: [id number] Date of Birth: 07-Aug-1950 No data recorded  Encounter Date: 01/12/2019  OT End of Session - 01/12/19 1117    Visit Number  2    Number of Visits  13    Date for OT Re-Evaluation  04/05/19    Authorization Type  UHC Medicare, $30 copay each OT/PT (seperate)    Authorization - Visit Number  2    Authorization - Number of Visits  10    OT Start Time  1108    OT Stop Time  1144    OT Time Calculation (min)  36 min    Activity Tolerance  Patient tolerated treatment well    Behavior During Therapy  [facility] for tasks assessed/performed       Past Medical History:  Diagnosis Date  . Colitis   . Diabetes mellitus without complication ([place])   . Hypertension   . Malnutrition ([place])   . Stroke [facility])     Past Surgical History:  Procedure Laterality Date  . BIOPSY  02/26/2018   Procedure: BIOPSY;  Surgeon: Wilford Corner, MD;  Location: [facility];  Service: Endoscopy;;  . COLONOSCOPY WITH PROPOFOL N/A 02/26/2018   Procedure: COLONOSCOPY WITH PROPOFOL;  Surgeon: Wilford Corner, MD;  Location: [facility];  Service: Endoscopy;  Laterality: N/A;  . ESOPHAGOGASTRODUODENOSCOPY (EGD) WITH PROPOFOL N/A 02/26/2018   Procedure: ESOPHAGOGASTRODUODENOSCOPY (EGD) WITH PROPOFOL;  Surgeon: Wilford Corner, MD;  Location: [facility];  Service: Endoscopy;  Laterality: N/A;  . NO PAST SURGERIES     UNCERTAIN OF NAME OF SURGERY    There were no vitals filed for this visit.  Subjective Assessment - 01/12/19 1109    Currently in Pain?  Yes    Pain Score  5     Pain Location  Hand    Pain Orientation  Right;Left    Pain Descriptors / Indicators  Aching    Pain Type  Chronic pain    Pain Onset  More than a month ago    Pain Frequency  Intermittent     Aggravating Factors   cold temps    Pain Relieving Factors  warmth.             Treatment:Fluidotherapy to LUE for pain and stiffness prior to exercise, no adverse reactions.              OT Education - 01/12/19 1119    Education Details  coordination HEP, putty HEP, ROM for hand    Person(s) Educated  Patient    Methods  Explanation;Demonstration;Verbal cues;Handout    Comprehension  Verbalized understanding;Returned demonstration;Verbal cues required       OT Short Term Goals - 01/05/19 2020      OT SHORT TERM GOAL #1   Title  Pt will be independent with intitial HEP.--check STGs 02/23/19    Time  6    Period  Weeks    Status  New      OT SHORT TERM GOAL #2   Title  Pt will demo at least 120* L shoulder flex for functional reaching without pain.    Baseline  110* scaption    Time  6    Period  Weeks    Status  New      OT SHORT TERM GOAL #3   Title  Pt  will demo at least 75% L gross finger flex for improved grasp.    Baseline  50%    Time  6    Period  Weeks    Status  New      OT SHORT TERM GOAL #4   Title  Pt will be able to sweep with supervision.    Time  6    Period  Weeks    Status  New        OT Long Term Goals - 01/05/19 2024      OT LONG TERM GOAL #1   Title  Pt will verbalize understanding of appropriate community fitness activities and updated HEP.--check LTGs 04/05/19    Time  12    Period  Weeks    Status  New      OT LONG TERM GOAL #2   Title  Pt will demo at least 125* L shoulder flex without pain for functional reaching.    Time  12    Period  Weeks    Status  New      OT LONG TERM GOAL #3   Title  Pt will improve bilateral hand coordination for ADLs as shown by decr time on 9-hole peg test by at least 5sec.    Baseline  R-35.63, L-40.90sec    Time  12    Period  Weeks      OT LONG TERM GOAL #4   Title  Pt will improve L grip strength by at least 8lbs to assist with opening containers/bottles.    Baseline  R-25.5,  L-9.9lbs    Time  12    Period  Weeks    Status  New      OT LONG TERM GOAL #5   Title  Pt will be able to carry plate/cup at least 15 feet without LOB or spills.    Time  12    Period  Weeks    Status  New      OT LONG TERM GOAL #6   Title  Pt will be able to sweep mod I.    Time  12    Period  Weeks    Status  New            Plan - 01/12/19 1117    Clinical Impression Statement  Pt is progressing towards goals. She demonstrates understanding of HEP.    OT Occupational Profile and History  Detailed Assessment- Review of Records and additional review of physical, cognitive, psychosocial history related to current functional performance    Occupational performance deficits (Please refer to evaluation for details):  ADL's;IADL's    Body Structure / Function / Physical Skills  Mobility;ADL;Balance;Endurance;Strength;UE functional use;ROM;Dexterity;Pain;FMC;Coordination;Decreased knowledge of use of DME;IADL;Sensation    Cognitive Skills  Memory    Rehab Potential  Good    Clinical Decision Making  Several treatment options, min-mod task modification necessary    Comorbidities Affecting Occupational Performance:  May have comorbidities impacting occupational performance    Modification or Assistance to Complete Evaluation   Min-Moderate modification of tasks or assist with assess necessary to complete eval    OT Frequency  1x / week   due to financial concerns at pt/husband request   OT Duration  12 weeks   +eval   OT Treatment/Interventions  Self-care/ADL training;Therapeutic exercise;Functional Mobility Training;Manual Therapy;Splinting;Neuromuscular education;Ultrasound;Energy conservation;Therapeutic activities;Cognitive remediation/compensation;DME and/or AE instruction;Paraffin;Cryotherapy;Fluidtherapy;Patient/family education;Passive range of motion;Moist Heat    Plan  HEP for shoulder    Consulted and Agree  with Plan of Care  Patient;Family member/caregiver    Family  Member Consulted  husband       Patient will benefit from skilled therapeutic intervention in order to improve the following deficits and impairments:   Body Structure / Function / Physical Skills: Mobility, ADL, Balance, Endurance, Strength, UE functional use, ROM, Dexterity, Pain, FMC, Coordination, Decreased knowledge of use of DME, IADL, Sensation Cognitive Skills: Memory     Visit Diagnosis: Muscle weakness (generalized)  Hemiplegia and hemiparesis following cerebral infarction affecting right dominant side (HCC)  Other lack of coordination  Stiffness of left hand, not elsewhere classified  Other symptoms and signs involving cognitive functions following cerebral infarction    Problem List Patient Active Problem List   Diagnosis Date Noted  . Seizure prophylaxis--probable seizure 04/12/2018  . Sacral decubitus ulcer, stage IV ([place]) 04/12/2018  . Acute blood loss anemia   . Impulsive   . Hypokalemia   . ETOH abuse   . Dysphagia, post-stroke   . Benign essential HTN   . Acute deep vein thrombosis (DVT) of distal vein of both lower extremities (HCC)   . Sinus tachycardia   . Esophageal perforation   . Perforation esophagus   . Sepsis with acute renal failure ([place])   . Acute deep vein thrombosis (DVT) of both peroneal veins   . Leukocytosis   . Sepsis due to Pseudomonas species ([place])   . Cerebral embolism with cerebral infarction 03/09/2018  . Malnutrition of moderate degree 03/08/2018  . Empyema ([place]) 03/04/2018  . Pressure injury of skin 03/04/2018  . Acute respiratory failure with hypercapnia ([place]) 03/04/2018  . Diarrhea 02/26/2018  . Pancytopenia ([place]) 02/19/2018  . Hypotension 02/19/2018  . Severe protein-calorie malnutrition ([place]) 02/17/2018  . Colitis 02/16/2018  . Weakness 02/16/2018  . Nausea vomiting and diarrhea 02/16/2018  . Diabetes ([place]) 02/16/2018  . Essential hypertension 02/16/2018  . Anemia 02/16/2018    Makaio Mach 01/13/2019, 5:33  PM  [facility] [address] [place], [place], [address] Phone: [phone]   Fax:  [phone]  Name: Maria Avila MRN: [id number] Date of Birth: 1950-10-27

## 2019-01-18 ENCOUNTER — Ambulatory Visit: Payer: Medicare Other | Admitting: Physical Therapy

## 2019-01-18 ENCOUNTER — Other Ambulatory Visit: Payer: Self-pay

## 2019-01-18 ENCOUNTER — Ambulatory Visit: Payer: Medicare Other | Admitting: Occupational Therapy

## 2019-01-18 DIAGNOSIS — R278 Other lack of coordination: Secondary | ICD-10-CM

## 2019-01-18 DIAGNOSIS — M6281 Muscle weakness (generalized): Secondary | ICD-10-CM

## 2019-01-18 DIAGNOSIS — M25612 Stiffness of left shoulder, not elsewhere classified: Secondary | ICD-10-CM | POA: Diagnosis not present

## 2019-01-18 DIAGNOSIS — M25641 Stiffness of right hand, not elsewhere classified: Secondary | ICD-10-CM | POA: Diagnosis not present

## 2019-01-18 DIAGNOSIS — M79661 Pain in right lower leg: Secondary | ICD-10-CM | POA: Diagnosis not present

## 2019-01-18 DIAGNOSIS — I69351 Hemiplegia and hemiparesis following cerebral infarction affecting right dominant side: Secondary | ICD-10-CM

## 2019-01-18 DIAGNOSIS — R2681 Unsteadiness on feet: Secondary | ICD-10-CM | POA: Diagnosis not present

## 2019-01-18 DIAGNOSIS — M79662 Pain in left lower leg: Secondary | ICD-10-CM | POA: Diagnosis not present

## 2019-01-18 DIAGNOSIS — I69318 Other symptoms and signs involving cognitive functions following cerebral infarction: Secondary | ICD-10-CM

## 2019-01-18 DIAGNOSIS — R2689 Other abnormalities of gait and mobility: Secondary | ICD-10-CM | POA: Diagnosis not present

## 2019-01-18 DIAGNOSIS — L89154 Pressure ulcer of sacral region, stage 4: Secondary | ICD-10-CM | POA: Diagnosis not present

## 2019-01-18 DIAGNOSIS — I639 Cerebral infarction, unspecified: Secondary | ICD-10-CM | POA: Diagnosis not present

## 2019-01-18 DIAGNOSIS — M25642 Stiffness of left hand, not elsewhere classified: Secondary | ICD-10-CM

## 2019-01-18 NOTE — Therapy (Signed)
[facility] [address] [place], [place], [address] Phone: [phone]   Fax:  [phone]  Occupational Therapy Treatment  Patient Details  Name: Maria Avila MRN: [id number] Date of Birth: April 29, 1951 No data recorded  Encounter Date: 01/18/2019  OT End of Session - 01/18/19 1325    Visit Number  3    Number of Visits  13    Date for OT Re-Evaluation  04/05/19    Authorization Type  UHC Medicare, $30 copay each OT/PT (seperate)    Authorization - Visit Number  3    Authorization - Number of Visits  10    OT Start Time  1319    OT Stop Time  1400    OT Time Calculation (min)  41 min    Activity Tolerance  Patient tolerated treatment well    Behavior During Therapy  [facility] for tasks assessed/performed       Past Medical History:  Diagnosis Date  . Colitis   . Diabetes mellitus without complication ([place])   . Hypertension   . Malnutrition ([place])   . Stroke [facility])     Past Surgical History:  Procedure Laterality Date  . BIOPSY  02/26/2018   Procedure: BIOPSY;  Surgeon: Wilford Corner, MD;  Location: [facility];  Service: Endoscopy;;  . COLONOSCOPY WITH PROPOFOL N/A 02/26/2018   Procedure: COLONOSCOPY WITH PROPOFOL;  Surgeon: Wilford Corner, MD;  Location: [facility];  Service: Endoscopy;  Laterality: N/A;  . ESOPHAGOGASTRODUODENOSCOPY (EGD) WITH PROPOFOL N/A 02/26/2018   Procedure: ESOPHAGOGASTRODUODENOSCOPY (EGD) WITH PROPOFOL;  Surgeon: Wilford Corner, MD;  Location: [facility];  Service: Endoscopy;  Laterality: N/A;  . NO PAST SURGERIES     UNCERTAIN OF NAME OF SURGERY    There were no vitals filed for this visit.  Subjective Assessment - 01/18/19 1324    Subjective   Pt reports left hand pain    Pertinent History  bilateral infarcts fall 2019.    PMH:  hx of questionable seizures, malnutrition, hypertension, diabetes, colitis, hx of heavy alcohol use, DM, HTN, hx of empyema/respiratory  failure and sepsis 10/19    Limitations  fall risk, questionable seizures    Patient Stated Goals  improve ability to use hands, open bottle, sweep    Currently in Pain?  Yes    Pain Score  4     Pain Location  Hand    Pain Orientation  Left    Pain Descriptors / Indicators  Aching    Pain Onset  More than a month ago    Pain Frequency  Intermittent    Aggravating Factors   mornings    Pain Relieving Factors  heat                Treatment: Fluidotherapy to bilateral UE's x 10 mins for stiffness and pain , no adverse reactions A/ROM, AA/ROM and place and hold for composite finger flexion followed by review of yellow putty HEP, min-mod v.c Supine closed chain shoulder flexion min v.c x 15 reps             OT Short Term Goals - 01/05/19 2020      OT SHORT TERM GOAL #1   Title  Pt will be independent with intitial HEP.--check STGs 02/23/19    Time  6    Period  Weeks    Status  New      OT SHORT TERM GOAL #2   Title  Pt will demo at least  120* L shoulder flex for functional reaching without pain.    Baseline  110* scaption    Time  6    Period  Weeks    Status  New      OT SHORT TERM GOAL #3   Title  Pt will demo at least 75% L gross finger flex for improved grasp.    Baseline  50%    Time  6    Period  Weeks    Status  New      OT SHORT TERM GOAL #4   Title  Pt will be able to sweep with supervision.    Time  6    Period  Weeks    Status  New        OT Long Term Goals - 01/05/19 2024      OT LONG TERM GOAL #1   Title  Pt will verbalize understanding of appropriate community fitness activities and updated HEP.--check LTGs 04/05/19    Time  12    Period  Weeks    Status  New      OT LONG TERM GOAL #2   Title  Pt will demo at least 125* L shoulder flex without pain for functional reaching.    Time  12    Period  Weeks    Status  New      OT LONG TERM GOAL #3   Title  Pt will improve bilateral hand coordination for ADLs as shown by decr time  on 9-hole peg test by at least 5sec.    Baseline  R-35.63, L-40.90sec    Time  12    Period  Weeks      OT LONG TERM GOAL #4   Title  Pt will improve L grip strength by at least 8lbs to assist with opening containers/bottles.    Baseline  R-25.5, L-9.9lbs    Time  12    Period  Weeks    Status  New      OT LONG TERM GOAL #5   Title  Pt will be able to carry plate/cup at least 15 feet without LOB or spills.    Time  12    Period  Weeks    Status  New      OT LONG TERM GOAL #6   Title  Pt will be able to sweep mod I.    Time  12    Period  Weeks    Status  New              Patient will benefit from skilled therapeutic intervention in order to improve the following deficits and impairments:           Visit Diagnosis: Muscle weakness (generalized)  Hemiplegia and hemiparesis following cerebral infarction affecting right dominant side (HCC)  Other lack of coordination  Stiffness of left hand, not elsewhere classified  Other symptoms and signs involving cognitive functions following cerebral infarction  Stiffness of right hand, not elsewhere classified  Stiffness of left shoulder, not elsewhere classified    Problem List Patient Active Problem List   Diagnosis Date Noted  . Seizure prophylaxis--probable seizure 04/12/2018  . Sacral decubitus ulcer, stage IV ([place]) 04/12/2018  . Acute blood loss anemia   . Impulsive   . Hypokalemia   . ETOH abuse   . Dysphagia, post-stroke   . Benign essential HTN   . Acute deep vein thrombosis (DVT) of distal vein of both lower extremities (HCC)   .  Sinus tachycardia   . Esophageal perforation   . Perforation esophagus   . Sepsis with acute renal failure ([place])   . Acute deep vein thrombosis (DVT) of both peroneal veins   . Leukocytosis   . Sepsis due to Pseudomonas species ([place])   . Cerebral embolism with cerebral infarction 03/09/2018  . Malnutrition of moderate degree 03/08/2018  . Empyema ([place]) 03/04/2018  .  Pressure injury of skin 03/04/2018  . Acute respiratory failure with hypercapnia ([place]) 03/04/2018  . Diarrhea 02/26/2018  . Pancytopenia ([place]) 02/19/2018  . Hypotension 02/19/2018  . Severe protein-calorie malnutrition ([place]) 02/17/2018  . Colitis 02/16/2018  . Weakness 02/16/2018  . Nausea vomiting and diarrhea 02/16/2018  . Diabetes ([place]) 02/16/2018  . Essential hypertension 02/16/2018  . Anemia 02/16/2018    RINE,KATHRYN 01/18/2019, 1:26 PM  [facility] [address] [place], [place], [address] Phone: [phone]   Fax:  [phone]  Name: Maria Avila MRN: [id number] Date of Birth: [birth year]

## 2019-01-18 NOTE — Patient Instructions (Signed)
Cane Overhead - Supine  Hold cane at thighs with both hands, extend arms straight over head. Hold 5 seconds. Repeat 20 times. Do 2 times per day.

## 2019-01-18 NOTE — Therapy (Signed)
[facility] [address] [place], [place], [address] Phone: [phone]   Fax:  [phone]  Physical Therapy Treatment  Patient Details  Name: Maria Avila MRN: [id number] Date of Birth: August 17, 1950 Referring Provider (PT): Jacobo Forest, MD   Encounter Date: 01/18/2019  PT End of Session - 01/18/19 1511    Visit Number  3    Number of Visits  13    Date for PT Re-Evaluation  04/01/19    Authorization Type  UHC Medicare  $30 co-pay PT & OT ea. so $60 for both    Authorization Time Period  OOP $3600  $2190.16 met  VL:MN    PT Start Time  1405    PT Stop Time  1446    PT Time Calculation (min)  41 min    Equipment Utilized During Treatment  Gait belt    Activity Tolerance  Patient tolerated treatment well;Patient limited by fatigue    Behavior During Therapy  [facility] for tasks assessed/performed;Flat affect       Past Medical History:  Diagnosis Date  . Colitis   . Diabetes mellitus without complication ([place])   . Hypertension   . Malnutrition ([place])   . Stroke [facility])     Past Surgical History:  Procedure Laterality Date  . BIOPSY  02/26/2018   Procedure: BIOPSY;  Surgeon: Wilford Corner, MD;  Location: [place];  Service: Endoscopy;;  . COLONOSCOPY WITH PROPOFOL N/A 02/26/2018   Procedure: COLONOSCOPY WITH PROPOFOL;  Surgeon: Wilford Corner, MD;  Location: [place];  Service: Endoscopy;  Laterality: N/A;  . ESOPHAGOGASTRODUODENOSCOPY (EGD) WITH PROPOFOL N/A 02/26/2018   Procedure: ESOPHAGOGASTRODUODENOSCOPY (EGD) WITH PROPOFOL;  Surgeon: Wilford Corner, MD;  Location: [place];  Service: Endoscopy;  Laterality: N/A;  . NO PAST SURGERIES     UNCERTAIN OF NAME OF SURGERY    There were no vitals filed for this visit.  Subjective Assessment - 01/18/19 1408    Subjective  Denies falls or changes since last visit.    Patient is accompained by:  Family member   husband   Pertinent History   Bil CVAs, DM, HTN, Malnutrition    Patient Stated Goals  To walk without walker.    Currently in Pain?  No/denies   See OT note this date concerning hand pain.  OT addressed during their session todayl   Pain Onset  More than a month ago        [facility] Adult PT Treatment/Exercise - 01/18/19 0001      Knee/Hip Exercises: Aerobic   Other Aerobic  Scifit level 1.5 all 4 extremities x 8 minutes at rpm>55-60.  RHR 58 SaO2 98%, EHR 66 and SaO2 99%          Balance Exercises - 01/18/19 1420      OTAGO PROGRAM   Head Movements  Standing;5 reps    Neck Movements  Standing;5 reps    Back Extension  Standing;5 reps    Trunk Movements  Standing;5 reps    Ankle Movements  Sitting;10 reps    Knee Extensor  10 reps    Knee Flexor  10 reps    Hip ABductor  10 reps    Ankle Plantorflexors  20 reps, support    Ankle Dorsiflexors  20 reps, support    Knee Bends  10 reps, support    Backwards Walking  Support   Pt with LOB several times so did not give as HEP   Sideways Walking  Assistive device   Counter for support   Tandem Stance  10 seconds, support   attempted but pt unable to achieve proper positioning   One Leg Stand  10 seconds, support    Sit to Stand  5 reps, bilateral support    Overall OTAGO Comments  Again reviewed breaking exercises up to do 3-5 at a time        PT Education - 01/18/19 1510    Education Details  Added to [facility] program.  See treatment section for details.    Person(s) Educated  Patient    Methods  Explanation;Demonstration;Handout    Comprehension  Verbalized understanding       PT Short Term Goals - 01/05/19 2219      PT SHORT TERM GOAL #1   Title  Patient & husband demonstrates & verbalizes understanding of use of recumbent stepper for [employer]. (All STGs Target Date: 02/02/2019)    Time  4    Period  Weeks    Status  New    Target Date  02/02/19      PT SHORT TERM GOAL #2   Title  Patient & husband demonstrates understanding of OTAGO HEP.    Time   4    Period  Weeks    Status  New    Target Date  02/02/19        PT Long Term Goals - 01/05/19 2215      PT LONG TERM GOAL #1   Title  Patient verbalizes & demonstrates understanding of ongoing HEP including community fitness. (All LTGs Target Date: 04/04/2019)    Time  12    Period  Weeks    Status  New    Target Date  04/04/19      PT LONG TERM GOAL #2   Title  Berg Balance score >36/56 to indicate lower fall risk.    Time  12    Period  Weeks    Status  New    Target Date  04/04/19      PT LONG TERM GOAL #3   Title  Timed Up & Go without device <13.5sec safely    Time  12    Period  Weeks    Status  New    Target Date  04/04/19      PT LONG TERM GOAL #4   Title  Patient ambulates community distances 150'  & negotiates ramps/ curbs with RW modified independent.    Time  12    Period  Weeks    Status  New    Target Date  04/04/19      PT LONG TERM GOAL #5   Title  Patient ambulates 100' around furniture without device modified independent.    Time  12    Period  Weeks    Status  New    Target Date  04/04/19            Plan - 01/18/19 1512    Clinical Impression Statement  Skilled session focused on advancing OTAGO exercises program and Scifit for endurance and prep for returning to [facility].  Continue PT per POC.    Personal Factors and Comorbidities  Age;Comorbidity 3+;Fitness;Past/Current Experience;Time since onset of injury/illness/exacerbation    Comorbidities  Bil CVAs, DM, HTN, Malnutrition    Examination-Activity Limitations  Carry;Locomotion Level;Reach Overhead;Squat;Stairs;Stand;Transfers    Examination-Participation Restrictions  Community Activity    Stability/Clinical Decision Making  Evolving/Moderate complexity    Rehab Potential  Good  PT Frequency  1x / week    PT Duration  12 weeks    PT Treatment/Interventions  ADLs/Self Care Home Management;DME Instruction;Gait training;Stair training;Functional mobility training;Therapeutic  activities;Therapeutic exercise;Balance training;Neuromuscular re-education;Patient/family education;Vestibular    PT Next Visit Plan  monitor HR & O2 sat, check if she started at [place] how it's going, review OTAGO exercises instructed & add additional exercises.    Consulted and Agree with Plan of Care  Patient    Family Member Consulted  --       Patient will benefit from skilled therapeutic intervention in order to improve the following deficits and impairments:  Abnormal gait, Cardiopulmonary status limiting activity, Decreased activity tolerance, Decreased balance, Decreased endurance, Decreased knowledge of use of DME, Decreased mobility, Decreased range of motion, Decreased strength, Dizziness, Postural dysfunction, Pain  Visit Diagnosis: Muscle weakness (generalized)  Hemiplegia and hemiparesis following cerebral infarction affecting right dominant side (HCC)  Other lack of coordination     Problem List Patient Active Problem List   Diagnosis Date Noted  . Seizure prophylaxis--probable seizure 04/12/2018  . Sacral decubitus ulcer, stage IV ([place]) 04/12/2018  . Acute blood loss anemia   . Impulsive   . Hypokalemia   . ETOH abuse   . Dysphagia, post-stroke   . Benign essential HTN   . Acute deep vein thrombosis (DVT) of distal vein of both lower extremities (HCC)   . Sinus tachycardia   . Esophageal perforation   . Perforation esophagus   . Sepsis with acute renal failure ([place])   . Acute deep vein thrombosis (DVT) of both peroneal veins   . Leukocytosis   . Sepsis due to Pseudomonas species ([place])   . Cerebral embolism with cerebral infarction 03/09/2018  . Malnutrition of moderate degree 03/08/2018  . Empyema ([place]) 03/04/2018  . Pressure injury of skin 03/04/2018  . Acute respiratory failure with hypercapnia ([place]) 03/04/2018  . Diarrhea 02/26/2018  . Pancytopenia ([place]) 02/19/2018  . Hypotension 02/19/2018  . Severe protein-calorie malnutrition ([place]) 02/17/2018  .  Colitis 02/16/2018  . Weakness 02/16/2018  . Nausea vomiting and diarrhea 02/16/2018  . Diabetes ([place]) 02/16/2018  . Essential hypertension 02/16/2018  . Anemia 02/16/2018    Narda Bonds, PTA [facility] 01/18/19 3:24 PM Phone: [phone] Fax: [facility] [address] [place], [place], [address] Phone: [phone]   Fax:  [phone]  Name: Shirrell E Avila MRN: [id number] Date of Birth: [birth year]

## 2019-01-25 ENCOUNTER — Encounter: Payer: Self-pay | Admitting: Occupational Therapy

## 2019-01-25 ENCOUNTER — Other Ambulatory Visit: Payer: Self-pay

## 2019-01-25 ENCOUNTER — Encounter: Payer: Self-pay | Admitting: Physical Therapy

## 2019-01-25 ENCOUNTER — Ambulatory Visit: Payer: Medicare Other | Admitting: Occupational Therapy

## 2019-01-25 ENCOUNTER — Ambulatory Visit: Payer: Medicare Other | Admitting: Physical Therapy

## 2019-01-25 DIAGNOSIS — M25641 Stiffness of right hand, not elsewhere classified: Secondary | ICD-10-CM

## 2019-01-25 DIAGNOSIS — R278 Other lack of coordination: Secondary | ICD-10-CM

## 2019-01-25 DIAGNOSIS — M25612 Stiffness of left shoulder, not elsewhere classified: Secondary | ICD-10-CM | POA: Diagnosis not present

## 2019-01-25 DIAGNOSIS — M25642 Stiffness of left hand, not elsewhere classified: Secondary | ICD-10-CM | POA: Diagnosis not present

## 2019-01-25 DIAGNOSIS — I69318 Other symptoms and signs involving cognitive functions following cerebral infarction: Secondary | ICD-10-CM

## 2019-01-25 DIAGNOSIS — M79662 Pain in left lower leg: Secondary | ICD-10-CM | POA: Diagnosis not present

## 2019-01-25 DIAGNOSIS — R2681 Unsteadiness on feet: Secondary | ICD-10-CM | POA: Diagnosis not present

## 2019-01-25 DIAGNOSIS — I69351 Hemiplegia and hemiparesis following cerebral infarction affecting right dominant side: Secondary | ICD-10-CM

## 2019-01-25 DIAGNOSIS — M6281 Muscle weakness (generalized): Secondary | ICD-10-CM

## 2019-01-25 DIAGNOSIS — R2689 Other abnormalities of gait and mobility: Secondary | ICD-10-CM | POA: Diagnosis not present

## 2019-01-25 DIAGNOSIS — M79661 Pain in right lower leg: Secondary | ICD-10-CM | POA: Diagnosis not present

## 2019-01-25 NOTE — Therapy (Signed)
[facility] [address] [place], [place], [address] Phone: [phone]   Fax:  [phone]  Physical Therapy Treatment  Patient Details  Name: Maria Avila MRN: [id number] Date of Birth: [birth year] Referring Provider (PT): Jacobo Forest, MD   Encounter Date: 01/25/2019   CLINIC OPERATION CHANGES: [facility] is open at lower capacity following universal masking, social distancing, and patient screening.  The patient's COVID risk of complications score is 4.   PT End of Session - 01/25/19 1647    Visit Number  4    Number of Visits  13    Date for PT Re-Evaluation  04/01/19    Authorization Type  UHC Medicare  $30 co-pay PT & OT ea. so $60 for both    Authorization Time Period  OOP $3600  $2190.16 met  VL:MN    PT Start Time  1230    PT Stop Time  1315    PT Time Calculation (min)  45 min    Equipment Utilized During Treatment  Gait belt    Activity Tolerance  Patient tolerated treatment well;Patient limited by fatigue    Behavior During Therapy  WFL for tasks assessed/performed;Flat affect       Past Medical History:  Diagnosis Date  . Colitis   . Diabetes mellitus without complication ([place])   . Hypertension   . Malnutrition ([place])   . Stroke [facility])     Past Surgical History:  Procedure Laterality Date  . BIOPSY  02/26/2018   Procedure: BIOPSY;  Surgeon: Wilford Corner, MD;  Location: [place];  Service: Endoscopy;;  . COLONOSCOPY WITH PROPOFOL N/A 02/26/2018   Procedure: COLONOSCOPY WITH PROPOFOL;  Surgeon: Wilford Corner, MD;  Location: [place];  Service: Endoscopy;  Laterality: N/A;  . ESOPHAGOGASTRODUODENOSCOPY (EGD) WITH PROPOFOL N/A 02/26/2018   Procedure: ESOPHAGOGASTRODUODENOSCOPY (EGD) WITH PROPOFOL;  Surgeon: Wilford Corner, MD;  Location: [place];  Service: Endoscopy;  Laterality: N/A;  . NO PAST SURGERIES     UNCERTAIN OF NAME OF SURGERY    There were no  vitals filed for this visit.  Subjective Assessment - 01/25/19 1235    Subjective  No falls. She has been doing the OTAGO exercises trying daily. She activated her [employer] but has not gone to [employer] yet. She plans to go tomorrow.    Patient is accompained by:  Family member   husband   Pertinent History  Bil CVAs, DM, HTN, Malnutrition    Patient Stated Goals  To walk without walker.    Currently in Pain?  No/denies    Pain Onset  More than a month ago                            Balance Exercises - 01/25/19 [place]   Head Movements  Standing;5 reps   RW close but no UE support   Neck Movements  Sitting;5 reps   closed chain   Back Extension  Standing;5 reps   RW support   Trunk Movements  Standing;5 reps   single UE support on RW   Ankle Movements  Sitting;10 reps    Knee Extensor  10 reps   no weight   Knee Flexor  10 reps   no wt, RW support   Hip ABductor  10 reps   No wt, RW support   Ankle Plantorflexors  20 reps, support   RWsupport,  Ankle Dorsiflexors  20 reps, support   RW   Knee Bends  10 reps, support   modified squat RW   Backwards Walking  Support   RW support   Sideways Walking  Assistive device   counter support   Tandem Stance  10 seconds, no support   modified intermittent touch at sink counting to 30   One Leg Stand  10 seconds, no support   modified with forefoot in lower cabinet sink intermittent   Sit to Stand  5 reps, one support   5 reps RUE & 5 reps LUE   Overall OTAGO Comments  break exercises into 3-5 working way thru booklet          PT Short Term Goals - 01/05/19 2219      PT SHORT TERM GOAL #1   Title  Patient & husband demonstrates & verbalizes understanding of use of recumbent stepper for [employer]. (All STGs Target Date: 02/02/2019)    Time  4    Period  Weeks    Status  New    Target Date  02/02/19      PT SHORT TERM GOAL #2   Title  Patient & husband demonstrates understanding of  OTAGO HEP.    Time  4    Period  Weeks    Status  New    Target Date  02/02/19        PT Long Term Goals - 01/05/19 2215      PT LONG TERM GOAL #1   Title  Patient verbalizes & demonstrates understanding of ongoing HEP including community fitness. (All LTGs Target Date: 04/04/2019)    Time  12    Period  Weeks    Status  New    Target Date  04/04/19      PT LONG TERM GOAL #2   Title  Berg Balance score >36/56 to indicate lower fall risk.    Time  12    Period  Weeks    Status  New    Target Date  04/04/19      PT LONG TERM GOAL #3   Title  Timed Up & Go without device <13.5sec safely    Time  12    Period  Weeks    Status  New    Target Date  04/04/19      PT LONG TERM GOAL #4   Title  Patient ambulates community distances 150'  & negotiates ramps/ curbs with RW modified independent.    Time  12    Period  Weeks    Status  New    Target Date  04/04/19      PT LONG TERM GOAL #5   Title  Patient ambulates 100' around furniture without device modified independent.    Time  12    Period  Weeks    Status  New    Target Date  04/04/19            Plan - 01/25/19 2151    Clinical Impression Statement  Patient appears to understand OTAGO HEP with modifications.  Patient plans to go to [facility] prior to visit next week    Personal Factors and Comorbidities  Age;Comorbidity 3+;Fitness;Past/Current Experience;Time since onset of injury/illness/exacerbation    Comorbidities  Bil CVAs, DM, HTN, Malnutrition    Examination-Activity Limitations  Carry;Locomotion Level;Reach Overhead;Squat;Stairs;Stand;Transfers    Examination-Participation Restrictions  Community Activity    Stability/Clinical Decision Making  Evolving/Moderate complexity    Rehab Potential  Good    PT Frequency  1x / week    PT Duration  12 weeks    PT Treatment/Interventions  ADLs/Self Care Home Management;DME Instruction;Gait training;Stair training;Functional mobility training;Therapeutic  activities;Therapeutic exercise;Balance training;Neuromuscular re-education;Patient/family education;Vestibular    PT Next Visit Plan  monitor HR & O2 sat, check if she started at [place] how it's going, review OTAGO exercises instructed & add additional exercises.    Consulted and Agree with Plan of Care  Patient       Patient will benefit from skilled therapeutic intervention in order to improve the following deficits and impairments:  Abnormal gait, Cardiopulmonary status limiting activity, Decreased activity tolerance, Decreased balance, Decreased endurance, Decreased knowledge of use of DME, Decreased mobility, Decreased range of motion, Decreased strength, Dizziness, Postural dysfunction, Pain  Visit Diagnosis: Muscle weakness (generalized)  Hemiplegia and hemiparesis following cerebral infarction affecting right dominant side (HCC)  Other lack of coordination  Other symptoms and signs involving cognitive functions following cerebral infarction     Problem List Patient Active Problem List   Diagnosis Date Noted  . Seizure prophylaxis--probable seizure 04/12/2018  . Sacral decubitus ulcer, stage IV ([place]) 04/12/2018  . Acute blood loss anemia   . Impulsive   . Hypokalemia   . ETOH abuse   . Dysphagia, post-stroke   . Benign essential HTN   . Acute deep vein thrombosis (DVT) of distal vein of both lower extremities (HCC)   . Sinus tachycardia   . Esophageal perforation   . Perforation esophagus   . Sepsis with acute renal failure ([place])   . Acute deep vein thrombosis (DVT) of both peroneal veins   . Leukocytosis   . Sepsis due to Pseudomonas species ([place])   . Cerebral embolism with cerebral infarction 03/09/2018  . Malnutrition of moderate degree 03/08/2018  . Empyema ([place]) 03/04/2018  . Pressure injury of skin 03/04/2018  . Acute respiratory failure with hypercapnia ([place]) 03/04/2018  . Diarrhea 02/26/2018  . Pancytopenia ([place]) 02/19/2018  . Hypotension 02/19/2018  .  Severe protein-calorie malnutrition ([place]) 02/17/2018  . Colitis 02/16/2018  . Weakness 02/16/2018  . Nausea vomiting and diarrhea 02/16/2018  . Diabetes ([place]) 02/16/2018  . Essential hypertension 02/16/2018  . Anemia 02/16/2018    Emerald Gehres PT, DPT 01/25/2019, 9:53 PM  [facility] [address] [place], [place], [address] Phone: [phone]   Fax:  [phone]  Name: Maria Avila MRN: [id number] Date of Birth: [birth year]

## 2019-01-25 NOTE — Therapy (Signed)
[facility] [address] [place], [place], [address] Phone: [phone]   Fax:  [phone]  Occupational Therapy Treatment  Patient Details  Name: Maria Avila MRN: [id number] Date of Birth: 01-23-1951 No data recorded  Encounter Date: 01/25/2019  OT End of Session - 01/25/19 1602    Visit Number  4    Number of Visits  13    Date for OT Re-Evaluation  04/05/19    Authorization Type  [employer]HC Medicare, $30 copay each OT/PT (seperate)    Authorization - Visit Number  4    Authorization - Number of Visits  10    OT Start Time  1322    OT Stop Time  1400    OT Time Calculation (min)  38 min    Activity Tolerance  Patient tolerated treatment well    Behavior During Therapy  [facility] for tasks assessed/performed       Past Medical History:  Diagnosis Date  . Colitis   . Diabetes mellitus without complication ([place])   . Hypertension   . Malnutrition ([place])   . Stroke [facility])     Past Surgical History:  Procedure Laterality Date  . BIOPSY  02/26/2018   Procedure: BIOPSY;  Surgeon: Wilford Corner, MD;  Location: [place];  Service: Endoscopy;;  . COLONOSCOPY WITH PROPOFOL N/A 02/26/2018   Procedure: COLONOSCOPY WITH PROPOFOL;  Surgeon: Wilford Corner, MD;  Location: [place];  Service: Endoscopy;  Laterality: N/A;  . ESOPHAGOGASTRODUODENOSCOPY (EGD) WITH PROPOFOL N/A 02/26/2018   Procedure: ESOPHAGOGASTRODUODENOSCOPY (EGD) WITH PROPOFOL;  Surgeon: Wilford Corner, MD;  Location: [place];  Service: Endoscopy;  Laterality: N/A;  . NO PAST SURGERIES     UNCERTAIN OF NAME OF SURGERY    There were no vitals filed for this visit.  Subjective Assessment - 01/25/19 1322    Subjective   Pt reports left hand pain    Pertinent History  bilateral infarcts fall 2019.    PMH:  hx of questionable seizures, malnutrition, hypertension, diabetes, colitis, hx of heavy alcohol use, DM, HTN, hx of empyema/respiratory  failure and sepsis 10/19    Limitations  fall risk, questionable seizures    Patient Stated Goals  improve ability to use hands, open bottle, sweep    Currently in Pain?  Yes    Pain Score  5     Pain Location  Hand    Pain Orientation  Left    Pain Descriptors / Indicators  Aching    Pain Type  Chronic pain    Pain Onset  More than a month ago    Pain Frequency  Intermittent    Aggravating Factors   making a fist    Pain Relieving Factors  rest            Treatment: Supine closed chain shoulder flexion, and chest press, mod v.c and facilitation to avoid shoulder hike, scapular retraction, depression with mod faciliation Seated low range shoulder flexion reach for the floor closed chain followed by midrange shoulder flexion with mod facilitation. Pt demonstrates decreased body awareness. Fluidotherapy x 10 mins followed by A/ROM, aaROM finger flexion to left hand for composite finger flexion                 OT Short Term Goals - 01/05/19 2020      OT SHORT TERM GOAL #1   Title  Pt will be independent with intitial HEP.--check STGs 02/23/19    Time  6  Period  Weeks    Status  New      OT SHORT TERM GOAL #2   Title  Pt will demo at least 120* L shoulder flex for functional reaching without pain.    Baseline  110* scaption    Time  6    Period  Weeks    Status  New      OT SHORT TERM GOAL #3   Title  Pt will demo at least 75% L gross finger flex for improved grasp.    Baseline  50%    Time  6    Period  Weeks    Status  New      OT SHORT TERM GOAL #4   Title  Pt will be able to sweep with supervision.    Time  6    Period  Weeks    Status  New        OT Long Term Goals - 01/05/19 2024      OT LONG TERM GOAL #1   Title  Pt will verbalize understanding of appropriate community fitness activities and updated HEP.--check LTGs 04/05/19    Time  12    Period  Weeks    Status  New      OT LONG TERM GOAL #2   Title  Pt will demo at least 125* L  shoulder flex without pain for functional reaching.    Time  12    Period  Weeks    Status  New      OT LONG TERM GOAL #3   Title  Pt will improve bilateral hand coordination for ADLs as shown by decr time on 9-hole peg test by at least 5sec.    Baseline  R-35.63, L-40.90sec    Time  12    Period  Weeks      OT LONG TERM GOAL #4   Title  Pt will improve L grip strength by at least 8lbs to assist with opening containers/bottles.    Baseline  R-25.5, L-9.9lbs    Time  12    Period  Weeks    Status  New      OT LONG TERM GOAL #5   Title  Pt will be able to carry plate/cup at least 15 feet without LOB or spills.    Time  12    Period  Weeks    Status  New      OT LONG TERM GOAL #6   Title  Pt will be able to sweep mod I.    Time  12    Period  Weeks    Status  New            Plan - 01/25/19 1603    Clinical Impression Statement  Pt is progressing towards goals. She demonstrates improved finger flexion in LUE following fluidotherapy. Pt continues to demonstrate decreased body awareness and she performs compensatroy shoulder hike.    OT Occupational Profile and History  Detailed Assessment- Review of Records and additional review of physical, cognitive, psychosocial history related to current functional performance    Occupational performance deficits (Please refer to evaluation for details):  ADL's;IADL's    Body Structure / Function / Physical Skills  Mobility;ADL;Balance;Endurance;Strength;UE functional use;ROM;Dexterity;Pain;FMC;Coordination;Decreased knowledge of use of DME;IADL;Sensation    Cognitive Skills  Memory    Rehab Potential  Good    Clinical Decision Making  Several treatment options, min-mod task modification necessary    Comorbidities Affecting Occupational Performance:  May have comorbidities impacting occupational performance    Modification or Assistance to Complete Evaluation   Min-Moderate modification of tasks or assist with assess necessary to complete  eval    OT Frequency  1x / week   due to financial concerns at pt/husband request   OT Duration  12 weeks   +eval   OT Treatment/Interventions  Self-care/ADL training;Therapeutic exercise;Functional Mobility Training;Manual Therapy;Splinting;Neuromuscular education;Ultrasound;Energy conservation;Therapeutic activities;Cognitive remediation/compensation;DME and/or AE instruction;Paraffin;Cryotherapy;Fluidtherapy;Patient/family education;Passive range of motion;Moist Heat    Plan  add to HEP for shoulder, continue to address hand function,    Consulted and Agree with Plan of Care  Patient;Family member/caregiver    Family Member Consulted  husband       Patient will benefit from skilled therapeutic intervention in order to improve the following deficits and impairments:   Body Structure / Function / Physical Skills: Mobility, ADL, Balance, Endurance, Strength, UE functional use, ROM, Dexterity, Pain, FMC, Coordination, Decreased knowledge of use of DME, IADL, Sensation Cognitive Skills: Memory     Visit Diagnosis: Muscle weakness (generalized)  Hemiplegia and hemiparesis following cerebral infarction affecting right dominant side (HCC)  Other lack of coordination  Stiffness of left hand, not elsewhere classified  Other symptoms and signs involving cognitive functions following cerebral infarction  Stiffness of right hand, not elsewhere classified  Stiffness of left shoulder, not elsewhere classified    Problem List Patient Active Problem List   Diagnosis Date Noted  . Seizure prophylaxis--probable seizure 04/12/2018  . Sacral decubitus ulcer, stage IV ([place]) 04/12/2018  . Acute blood loss anemia   . Impulsive   . Hypokalemia   . ETOH abuse   . Dysphagia, post-stroke   . Benign essential HTN   . Acute deep vein thrombosis (DVT) of distal vein of both lower extremities (HCC)   . Sinus tachycardia   . Esophageal perforation   . Perforation esophagus   . Sepsis with acute  renal failure ([place])   . Acute deep vein thrombosis (DVT) of both peroneal veins   . Leukocytosis   . Sepsis due to Pseudomonas species ([place])   . Cerebral embolism with cerebral infarction 03/09/2018  . Malnutrition of moderate degree 03/08/2018  . Empyema ([place]) 03/04/2018  . Pressure injury of skin 03/04/2018  . Acute respiratory failure with hypercapnia ([place]) 03/04/2018  . Diarrhea 02/26/2018  . Pancytopenia ([place]) 02/19/2018  . Hypotension 02/19/2018  . Severe protein-calorie malnutrition ([place]) 02/17/2018  . Colitis 02/16/2018  . Weakness 02/16/2018  . Nausea vomiting and diarrhea 02/16/2018  . Diabetes ([place]) 02/16/2018  . Essential hypertension 02/16/2018  . Anemia 02/16/2018    , 01/25/2019, 4:04 PM  [facility] [address] [place], [place], [address] Phone: [phone]   Fax:  [phone]  Name: Maria Avila MRN: [id number] Date of Birth: Sep 07, 1950

## 2019-01-31 DIAGNOSIS — I639 Cerebral infarction, unspecified: Secondary | ICD-10-CM | POA: Diagnosis not present

## 2019-01-31 DIAGNOSIS — L89154 Pressure ulcer of sacral region, stage 4: Secondary | ICD-10-CM | POA: Diagnosis not present

## 2019-01-31 DIAGNOSIS — T8189XA Other complications of procedures, not elsewhere classified, initial encounter: Secondary | ICD-10-CM | POA: Diagnosis not present

## 2019-02-02 ENCOUNTER — Other Ambulatory Visit: Payer: Self-pay

## 2019-02-02 ENCOUNTER — Ambulatory Visit: Payer: Medicare Other | Admitting: Occupational Therapy

## 2019-02-02 ENCOUNTER — Ambulatory Visit: Payer: Medicare Other | Admitting: Physical Therapy

## 2019-02-02 ENCOUNTER — Encounter: Payer: Self-pay | Admitting: Physical Therapy

## 2019-02-02 ENCOUNTER — Encounter: Payer: Self-pay | Admitting: Occupational Therapy

## 2019-02-02 DIAGNOSIS — M79661 Pain in right lower leg: Secondary | ICD-10-CM | POA: Diagnosis not present

## 2019-02-02 DIAGNOSIS — I69351 Hemiplegia and hemiparesis following cerebral infarction affecting right dominant side: Secondary | ICD-10-CM

## 2019-02-02 DIAGNOSIS — M25612 Stiffness of left shoulder, not elsewhere classified: Secondary | ICD-10-CM

## 2019-02-02 DIAGNOSIS — R2689 Other abnormalities of gait and mobility: Secondary | ICD-10-CM

## 2019-02-02 DIAGNOSIS — M6281 Muscle weakness (generalized): Secondary | ICD-10-CM

## 2019-02-02 DIAGNOSIS — I69318 Other symptoms and signs involving cognitive functions following cerebral infarction: Secondary | ICD-10-CM

## 2019-02-02 DIAGNOSIS — R278 Other lack of coordination: Secondary | ICD-10-CM | POA: Diagnosis not present

## 2019-02-02 DIAGNOSIS — R2681 Unsteadiness on feet: Secondary | ICD-10-CM | POA: Diagnosis not present

## 2019-02-02 DIAGNOSIS — M25642 Stiffness of left hand, not elsewhere classified: Secondary | ICD-10-CM | POA: Diagnosis not present

## 2019-02-02 DIAGNOSIS — M79662 Pain in left lower leg: Secondary | ICD-10-CM | POA: Diagnosis not present

## 2019-02-02 DIAGNOSIS — M25641 Stiffness of right hand, not elsewhere classified: Secondary | ICD-10-CM | POA: Diagnosis not present

## 2019-02-02 NOTE — Therapy (Signed)
[facility] [address] [place], [place], [address] Phone: [phone]   Fax:  [phone]  Physical Therapy Treatment  Patient Details  Name: Maria Avila MRN: [id number] Date of Birth: 1950-10-11 Referring Provider (PT): Jacobo Forest, MD   Encounter Date: 02/02/2019   CLINIC OPERATION CHANGES: [facility] is open at lower capacity following universal masking, social distancing, and patient screening.  The patient's COVID risk of complications score is 4.    PT End of Session - 02/02/19 1252    Visit Number  5    Number of Visits  13    Date for PT Re-Evaluation  04/01/19    Authorization Type  [employer]HC Medicare  $30 co-pay PT & OT ea. so $60 for both    Authorization Time Period  OOP $3600  $2190.16 met  VL:MN    PT Start Time  1149    PT Stop Time  1229    PT Time Calculation (min)  40 min    Equipment Utilized During Treatment  Gait belt    Activity Tolerance  Patient tolerated treatment well;Patient limited by fatigue    Behavior During Therapy  WFL for tasks assessed/performed;Flat affect       Past Medical History:  Diagnosis Date  . Colitis   . Diabetes mellitus without complication ([place])   . Hypertension   . Malnutrition ([place])   . Stroke [facility])     Past Surgical History:  Procedure Laterality Date  . BIOPSY  02/26/2018   Procedure: BIOPSY;  Surgeon: Wilford Corner, MD;  Location: [facility];  Service: Endoscopy;;  . COLONOSCOPY WITH PROPOFOL N/A 02/26/2018   Procedure: COLONOSCOPY WITH PROPOFOL;  Surgeon: Wilford Corner, MD;  Location: [facility] [place];  Service: Endoscopy;  Laterality: N/A;  . ESOPHAGOGASTRODUODENOSCOPY (EGD) WITH PROPOFOL N/A 02/26/2018   Procedure: ESOPHAGOGASTRODUODENOSCOPY (EGD) WITH PROPOFOL;  Surgeon: Wilford Corner, MD;  Location: [facility];  Service: Endoscopy;  Laterality: N/A;  . NO PAST SURGERIES     UNCERTAIN OF NAME OF SURGERY    There were  no vitals filed for this visit.  Subjective Assessment - 02/02/19 1150    Subjective  No falls. She has been doing OTAGO program but does not get to all of exercises. She activated her [place] with [facility] and is waiting for appointment with [employer] to start.    Patient is accompained by:  Family member   husband   Pertinent History  Bil CVAs, DM, HTN, Malnutrition    Patient Stated Goals  To walk without walker.    Currently in Pain?  No/denies   hands are normal achiness   Pain Onset  More than a month ago                       [facility] Adult PT Treatment/Exercise - 02/02/19 1145      Neuro Re-ed    Neuro Re-ed Details   sitting on 24" bar stool with feet supported on floor without UE support:  forward lean to upright, back lean to upright, rotation right & left and sidebend right & left.  PT demo, tactile & verbal cues for movements, upright posture & balance reactions.  Sit to stand without UE assist 10 reps with minA twice to staibilize   after activity: HR78 SpO2 97%     Knee/Hip Exercises: Aerobic   Other Aerobic  Scifit level 1.5 all 4 extremities x 8 minutes at rpm>55-60.  RHR 53 SaO2  98%, EHR 68 SpO2 98%   advised intiat [employer] 2 sets of 8 min with 2-3 minute rest b/w         Balance Exercises - 02/02/19 1145      Balance Exercises: Standing   Standing Eyes Opened  Wide (BOA);Head turns;Solid surface;Other reps (comment)   feet hip width, head turn 4 directions, tactile & verbalcues   Standing Eyes Closed  Wide (BOA);Head turns;Solid surface;Other reps (comment);10 secs   static 10sec 2x; head turns 4 directions with MinA   Overall Comments  Other (comment)   After balance exercises HR 72, SpO2 98%         PT Short Term Goals - 02/02/19 1353      PT SHORT TERM GOAL #1   Title  Patient & husband demonstrates & verbalizes understanding of use of recumbent stepper for [employer]. (All STGs Target Date: 02/02/2019)    Baseline  MET 02/02/2019    Time  4     Period  Weeks    Status  Achieved    Target Date  02/02/19      PT SHORT TERM GOAL #2   Title  Patient & husband demonstrates understanding of OTAGO HEP.    Baseline  MET 02/02/2019    Time  4    Period  Weeks    Status  Achieved    Target Date  02/02/19        PT Short Term Goals - 02/02/19 1359      PT SHORT TERM GOAL #1   Title  Timed Up & Go without device <15seconds with no balance losses.    Time  4    Period  Weeks    Status  New    Target Date  03/04/19      PT SHORT TERM GOAL #2   Title  Patient ambulates 200' outdoors on paved surfaces & negotiates ramps/curbs with RW with supervision.    Time  4    Period  Weeks    Status  New    Target Date  03/04/19      PT SHORT TERM GOAL #3   Title  Patient ambulates 60' around obstacles without device with minA    Time  4    Period  Weeks    Status  New    Target Date  03/04/19        PT Long Term Goals - 01/05/19 2215      PT LONG TERM GOAL #1   Title  Patient verbalizes & demonstrates understanding of ongoing HEP including community fitness. (All LTGs Target Date: 04/04/2019)    Time  12    Period  Weeks    Status  New    Target Date  04/04/19      PT LONG TERM GOAL #2   Title  Berg Balance score >36/56 to indicate lower fall risk.    Time  12    Period  Weeks    Status  New    Target Date  04/04/19      PT LONG TERM GOAL #3   Title  Timed Up & Go without device <13.5sec safely    Time  12    Period  Weeks    Status  New    Target Date  04/04/19      PT LONG TERM GOAL #4   Title  Patient ambulates community distances 150'  & negotiates ramps/ curbs with RW modified independent.  Time  12    Period  Weeks    Status  New    Target Date  04/04/19      PT LONG TERM GOAL #5   Title  Patient ambulates 100' around furniture without device modified independent.    Time  12    Period  Weeks    Status  New    Target Date  04/04/19            Plan - 02/02/19 1356    Clinical Impression  Statement  Patient met both STGs set for first 30 days.  PT session focused on balance activities to facilitate visual, proprioceptive & vestibular input.    Personal Factors and Comorbidities  Age;Comorbidity 3+;Fitness;Past/Current Experience;Time since onset of injury/illness/exacerbation    Comorbidities  Bil CVAs, DM, HTN, Malnutrition    Examination-Activity Limitations  Carry;Locomotion Level;Reach Overhead;Squat;Stairs;Stand;Transfers    Examination-Participation Restrictions  Community Activity    Stability/Clinical Decision Making  Evolving/Moderate complexity    Rehab Potential  Good    PT Frequency  1x / week    PT Duration  12 weeks    PT Treatment/Interventions  ADLs/Self Care Home Management;DME Instruction;Gait training;Stair training;Functional mobility training;Therapeutic activities;Therapeutic exercise;Balance training;Neuromuscular re-education;Patient/family education;Vestibular    PT Next Visit Plan  monitor HR & O2 sat, work towards updated STGs, check if has started at [facility] yet    Consulted and Agree with Plan of Care  Patient       Patient will benefit from skilled therapeutic intervention in order to improve the following deficits and impairments:  Abnormal gait, Cardiopulmonary status limiting activity, Decreased activity tolerance, Decreased balance, Decreased endurance, Decreased knowledge of use of DME, Decreased mobility, Decreased range of motion, Decreased strength, Dizziness, Postural dysfunction, Pain  Visit Diagnosis: Muscle weakness (generalized)  Hemiplegia and hemiparesis following cerebral infarction affecting right dominant side (HCC)  Other lack of coordination  Other symptoms and signs involving cognitive functions following cerebral infarction  Stiffness of left hand, not elsewhere classified  Stiffness of right hand, not elsewhere classified     Problem List Patient Active Problem List   Diagnosis Date Noted  . Seizure  prophylaxis--probable seizure 04/12/2018  . Sacral decubitus ulcer, stage IV ([place]) 04/12/2018  . Acute blood loss anemia   . Impulsive   . Hypokalemia   . ETOH abuse   . Dysphagia, post-stroke   . Benign essential HTN   . Acute deep vein thrombosis (DVT) of distal vein of both lower extremities (HCC)   . Sinus tachycardia   . Esophageal perforation   . Perforation esophagus   . Sepsis with acute renal failure ([place])   . Acute deep vein thrombosis (DVT) of both peroneal veins   . Leukocytosis   . Sepsis due to Pseudomonas species ([place])   . Cerebral embolism with cerebral infarction 03/09/2018  . Malnutrition of moderate degree 03/08/2018  . Empyema ([place]) 03/04/2018  . Pressure injury of skin 03/04/2018  . Acute respiratory failure with hypercapnia ([place]) 03/04/2018  . Diarrhea 02/26/2018  . Pancytopenia ([place]) 02/19/2018  . Hypotension 02/19/2018  . Severe protein-calorie malnutrition ([place]) 02/17/2018  . Colitis 02/16/2018  . Weakness 02/16/2018  . Nausea vomiting and diarrhea 02/16/2018  . Diabetes ([place]) 02/16/2018  . Essential hypertension 02/16/2018  . Anemia 02/16/2018    Florenda Watt PT, DPT 02/02/2019, 1:59 PM  [facility] [address] [place], [place], [address] Phone: [phone]   Fax:  [phone]  Name: Haliey E Avila MRN:  [id number] Date of Birth: 12-12-1950

## 2019-02-02 NOTE — Patient Instructions (Addendum)
    Cane Overhead - Standing   With arms straight, hold cane forward at waist. Raise cane with head up. Hold 3 seconds. Repeat 10 times. Do 2 times per day.      ROM: Abduction - Wand   Sitting, Holding wand with left hand palm up, push wand directly out to side, leading with other hand palm down, until stretch is felt. Hold 3 seconds.  Don't raise shoulder.  Don't go above shoulder height. Repeat 10 times per set. Do 2 sessions per day.     ROM: Extension - Wand (Standing)   Stand holding wand behind back. Raise arms as far as possible.  PALMS FORWARD Repeat 10 times per set.  Do 2 sessions per day.

## 2019-02-02 NOTE — Therapy (Signed)
[facility] [address] [place], [place], [address] Phone: [phone]   Fax:  [phone]  Occupational Therapy Treatment  Patient Details  Name: Maria Avila MRN: [id number] Date of Birth: January 10, 1951 No data recorded  Encounter Date: 02/02/2019  OT End of Session - 02/02/19 1246    Visit Number  5    Number of Visits  13    Date for OT Re-Evaluation  04/05/19    Authorization Type  [employer] Medicare, $30 copay each OT/PT (seperate)    Authorization - Visit Number  5    Authorization - Number of Visits  10    OT Start Time  1233    OT Stop Time  1313    OT Time Calculation (min)  40 min    Activity Tolerance  Patient tolerated treatment well    Behavior During Therapy  [facility] for tasks assessed/performed       Past Medical History:  Diagnosis Date  . Colitis   . Diabetes mellitus without complication ([place])   . Hypertension   . Malnutrition ([place])   . Stroke [facility])     Past Surgical History:  Procedure Laterality Date  . BIOPSY  02/26/2018   Procedure: BIOPSY;  Surgeon: Wilford Corner, MD;  Location: [place];  Service: Endoscopy;;  . COLONOSCOPY WITH PROPOFOL N/A 02/26/2018   Procedure: COLONOSCOPY WITH PROPOFOL;  Surgeon: Wilford Corner, MD;  Location: [facility];  Service: Endoscopy;  Laterality: N/A;  . ESOPHAGOGASTRODUODENOSCOPY (EGD) WITH PROPOFOL N/A 02/26/2018   Procedure: ESOPHAGOGASTRODUODENOSCOPY (EGD) WITH PROPOFOL;  Surgeon: Wilford Corner, MD;  Location: [facility];  Service: Endoscopy;  Laterality: N/A;  . NO PAST SURGERIES     UNCERTAIN OF NAME OF SURGERY    There were no vitals filed for this visit.  Subjective Assessment - 02/02/19 1225    Subjective   I'm working on my hands.  I think that they are going to stay this way.    Pertinent History  bilateral infarcts fall 2019.    PMH:  hx of questionable seizures, malnutrition, hypertension, diabetes, colitis, hx of heavy alcohol  use, DM, HTN, hx of empyema/respiratory failure and sepsis 10/19    Limitations  fall risk, questionable seizures    Patient Stated Goals  improve ability to use hands, open bottle, sweep    Currently in Pain?  No/denies    Pain Onset  More than a month ago        Fludio x 39min to both hands for pain, stiffness with no adverse reactions.    PROM to L hand in composite finger flex, IP flex followed by place and holds, PROM composite flex R 2nd digit.  Updated putty HEP to red:  Pt returned demo gross grip and individual finger pinch strength.    Picking up 3-5 checkers in each hand and manipulated to place in container with min difficulty.           OT Education - 02/02/19 1304    Education Details  cane ex updates--see pt instructions    Person(s) Educated  Patient    Methods  Explanation;Demonstration;Verbal cues;Handout    Comprehension  Verbalized understanding;Returned demonstration;Verbal cues required       OT Short Term Goals - 01/05/19 2020      OT SHORT TERM GOAL #1   Title  Pt will be independent with intitial HEP.--check STGs 02/23/19    Time  6    Period  Weeks    Status  New      OT SHORT TERM GOAL #2   Title  Pt will demo at least 120* L shoulder flex for functional reaching without pain.    Baseline  110* scaption    Time  6    Period  Weeks    Status  New      OT SHORT TERM GOAL #3   Title  Pt will demo at least 75% L gross finger flex for improved grasp.    Baseline  50%    Time  6    Period  Weeks    Status  New      OT SHORT TERM GOAL #4   Title  Pt will be able to sweep with supervision.    Time  6    Period  Weeks    Status  New        OT Long Term Goals - 01/05/19 2024      OT LONG TERM GOAL #1   Title  Pt will verbalize understanding of appropriate community fitness activities and updated HEP.--check LTGs 04/05/19    Time  12    Period  Weeks    Status  New      OT LONG TERM GOAL #2   Title  Pt will demo at least 125* L  shoulder flex without pain for functional reaching.    Time  12    Period  Weeks    Status  New      OT LONG TERM GOAL #3   Title  Pt will improve bilateral hand coordination for ADLs as shown by decr time on 9-hole peg test by at least 5sec.    Baseline  R-35.63, L-40.90sec    Time  12    Period  Weeks      OT LONG TERM GOAL #4   Title  Pt will improve L grip strength by at least 8lbs to assist with opening containers/bottles.    Baseline  R-25.5, L-9.9lbs    Time  12    Period  Weeks    Status  New      OT LONG TERM GOAL #5   Title  Pt will be able to carry plate/cup at least 15 feet without LOB or spills.    Time  12    Period  Weeks    Status  New      OT LONG TERM GOAL #6   Title  Pt will be able to sweep mod I.    Time  12    Period  Weeks    Status  New            Plan - 02/02/19 1247    Clinical Impression Statement  Pt is progressing towards goals with improved finger flex and grip after Fludio/stretching.  Pt demo improving coordination as well.    OT Occupational Profile and History  Detailed Assessment- Review of Records and additional review of physical, cognitive, psychosocial history related to current functional performance    Occupational performance deficits (Please refer to evaluation for details):  ADL's;IADL's    Body Structure / Function / Physical Skills  Mobility;ADL;Balance;Endurance;Strength;UE functional use;ROM;Dexterity;Pain;FMC;Coordination;Decreased knowledge of use of DME;IADL;Sensation    Cognitive Skills  Memory    Rehab Potential  Good    Clinical Decision Making  Several treatment options, min-mod task modification necessary    Comorbidities Affecting Occupational Performance:  May have comorbidities impacting occupational performance    Modification or Assistance to Complete Evaluation  Min-Moderate modification of tasks or assist with assess necessary to complete eval    OT Frequency  1x / week   due to financial concerns at  pt/husband request   OT Duration  12 weeks   +eval   OT Treatment/Interventions  Self-care/ADL training;Therapeutic exercise;Functional Mobility Training;Manual Therapy;Splinting;Neuromuscular education;Ultrasound;Energy conservation;Therapeutic activities;Cognitive remediation/compensation;DME and/or AE instruction;Paraffin;Cryotherapy;Fluidtherapy;Patient/family education;Passive range of motion;Moist Heat    Plan  shoulder ROM/functional reach, continue to address hand function/coordination; begin checking STGs    Consulted and Agree with Plan of Care  Patient;Family member/caregiver    Family Member Consulted  husband       Patient will benefit from skilled therapeutic intervention in order to improve the following deficits and impairments:   Body Structure / Function / Physical Skills: Mobility, ADL, Balance, Endurance, Strength, UE functional use, ROM, Dexterity, Pain, FMC, Coordination, Decreased knowledge of use of DME, IADL, Sensation Cognitive Skills: Memory     Visit Diagnosis: Hemiplegia and hemiparesis following cerebral infarction affecting right dominant side (HCC)  Other lack of coordination  Other symptoms and signs involving cognitive functions following cerebral infarction  Stiffness of left hand, not elsewhere classified  Stiffness of right hand, not elsewhere classified  Stiffness of left shoulder, not elsewhere classified  Other abnormalities of gait and mobility  Unsteadiness on feet  Muscle weakness (generalized)    Problem List Patient Active Problem List   Diagnosis Date Noted  . Seizure prophylaxis--probable seizure 04/12/2018  . Sacral decubitus ulcer, stage IV ([place]) 04/12/2018  . Acute blood loss anemia   . Impulsive   . Hypokalemia   . ETOH abuse   . Dysphagia, post-stroke   . Benign essential HTN   . Acute deep vein thrombosis (DVT) of distal vein of both lower extremities (HCC)   . Sinus tachycardia   . Esophageal perforation   .  Perforation esophagus   . Sepsis with acute renal failure ([place])   . Acute deep vein thrombosis (DVT) of both peroneal veins   . Leukocytosis   . Sepsis due to Pseudomonas species ([place])   . Cerebral embolism with cerebral infarction 03/09/2018  . Malnutrition of moderate degree 03/08/2018  . Empyema ([place]) 03/04/2018  . Pressure injury of skin 03/04/2018  . Acute respiratory failure with hypercapnia ([place]) 03/04/2018  . Diarrhea 02/26/2018  . Pancytopenia ([place]) 02/19/2018  . Hypotension 02/19/2018  . Severe protein-calorie malnutrition ([place]) 02/17/2018  . Colitis 02/16/2018  . Weakness 02/16/2018  . Nausea vomiting and diarrhea 02/16/2018  . Diabetes ([place]) 02/16/2018  . Essential hypertension 02/16/2018  . Anemia 02/16/2018    [facility] 02/02/2019, 3:18 PM  [facility] [address] [place], [place], [address] Phone: [phone]   Fax:  [phone]  Name: Maria Avila MRN: [id number] Date of Birth: 02/09/1951   Vianne Bulls, OTR/L [facility] [address] [place], [place]  [address] [phone] phone [phone] 02/02/19 3:18 PM

## 2019-02-03 DIAGNOSIS — K8020 Calculus of gallbladder without cholecystitis without obstruction: Secondary | ICD-10-CM | POA: Diagnosis not present

## 2019-02-09 ENCOUNTER — Ambulatory Visit: Payer: Medicare Other | Attending: Physical Medicine & Rehabilitation | Admitting: Occupational Therapy

## 2019-02-09 ENCOUNTER — Ambulatory Visit: Payer: Medicare Other | Admitting: Physical Therapy

## 2019-02-09 ENCOUNTER — Encounter: Payer: Self-pay | Admitting: Physical Therapy

## 2019-02-09 ENCOUNTER — Other Ambulatory Visit: Payer: Self-pay

## 2019-02-09 ENCOUNTER — Encounter: Payer: Self-pay | Admitting: Occupational Therapy

## 2019-02-09 DIAGNOSIS — M25612 Stiffness of left shoulder, not elsewhere classified: Secondary | ICD-10-CM | POA: Diagnosis not present

## 2019-02-09 DIAGNOSIS — I69351 Hemiplegia and hemiparesis following cerebral infarction affecting right dominant side: Secondary | ICD-10-CM

## 2019-02-09 DIAGNOSIS — R2681 Unsteadiness on feet: Secondary | ICD-10-CM | POA: Diagnosis not present

## 2019-02-09 DIAGNOSIS — M6281 Muscle weakness (generalized): Secondary | ICD-10-CM

## 2019-02-09 DIAGNOSIS — R278 Other lack of coordination: Secondary | ICD-10-CM

## 2019-02-09 DIAGNOSIS — I69318 Other symptoms and signs involving cognitive functions following cerebral infarction: Secondary | ICD-10-CM

## 2019-02-09 DIAGNOSIS — M25641 Stiffness of right hand, not elsewhere classified: Secondary | ICD-10-CM

## 2019-02-09 DIAGNOSIS — R2689 Other abnormalities of gait and mobility: Secondary | ICD-10-CM | POA: Diagnosis not present

## 2019-02-09 DIAGNOSIS — M25642 Stiffness of left hand, not elsewhere classified: Secondary | ICD-10-CM | POA: Diagnosis not present

## 2019-02-09 NOTE — Therapy (Signed)
[facility] [address] [place], [place], [address] Phone: [phone]   Fax:  [phone]  Occupational Therapy Treatment  Patient Details  Name: Maria Avila MRN: [id number] Date of Birth: 1950/10/21 No data recorded  Encounter Date: 02/09/2019  OT End of Session - 02/09/19 1236    Visit Number  6    Number of Visits  13    Date for OT Re-Evaluation  04/05/19    Authorization Type  [employer] Medicare, $30 copay each OT/PT (seperate)    Authorization - Visit Number  6    Authorization - Number of Visits  10    OT Start Time  1233    OT Stop Time  1312    OT Time Calculation (min)  39 min    Activity Tolerance  Patient tolerated treatment well    Behavior During Therapy  [facility] for tasks assessed/performed       Past Medical History:  Diagnosis Date  . Colitis   . Diabetes mellitus without complication ([place])   . Hypertension   . Malnutrition ([place])   . Stroke [facility])     Past Surgical History:  Procedure Laterality Date  . BIOPSY  02/26/2018   Procedure: BIOPSY;  Surgeon: Wilford Corner, MD;  Location: [facility];  Service: Endoscopy;;  . COLONOSCOPY WITH PROPOFOL N/A 02/26/2018   Procedure: COLONOSCOPY WITH PROPOFOL;  Surgeon: Wilford Corner, MD;  Location: [place];  Service: Endoscopy;  Laterality: N/A;  . ESOPHAGOGASTRODUODENOSCOPY (EGD) WITH PROPOFOL N/A 02/26/2018   Procedure: ESOPHAGOGASTRODUODENOSCOPY (EGD) WITH PROPOFOL;  Surgeon: Wilford Corner, MD;  Location: [facility];  Service: Endoscopy;  Laterality: N/A;  . NO PAST SURGERIES     UNCERTAIN OF NAME OF SURGERY    There were no vitals filed for this visit.  Subjective Assessment - 02/09/19 1236    Subjective   about the same    Pertinent History  bilateral infarcts fall 2019.    PMH:  hx of questionable seizures, malnutrition, hypertension, diabetes, colitis, hx of heavy alcohol use, DM, HTN, hx of empyema/respiratory failure and  sepsis 10/19    Limitations  fall risk, questionable seizures    Patient Stated Goals  improve ability to use hands, open bottle, sweep    Currently in Pain?  No/denies    Pain Onset  More than a month ago          Fludio x 17min to both hands for pain, stiffness with no adverse reactions.    PROM to L hand in composite finger flex, IP flex followed by place and holds.  AROM composite flex, IP flex, and MP flex.  Picking up blocks with gripper set on level 1 (black spring) for sustained grip strength with min difficulty with each hand.  Rolling marker in L hand working on AROM at all 3 finger joints with mod cueing/difficulty.  Arm bike x5 min level for reciprocal movement with no rest breaks  Standing and performing functional reaching to place/remove clothespins with 1-8lb resistance on vertical pole with each UE for incr strength, balance, and functional reach.  Standing, sweeping with supervision and min v.c. for strategies (sitting to use dustpan), use of one hand or standing against counter prn.          OT Short Term Goals - 01/05/19 2020      OT SHORT TERM GOAL #1   Title  Pt will be independent with intitial HEP.--check STGs 02/23/19    Time  6  Period  Weeks    Status  New      OT SHORT TERM GOAL #2   Title  Pt will demo at least 120* L shoulder flex for functional reaching without pain.    Baseline  110* scaption    Time  6    Period  Weeks    Status  New      OT SHORT TERM GOAL #3   Title  Pt will demo at least 75% L gross finger flex for improved grasp.    Baseline  50%    Time  6    Period  Weeks    Status  New      OT SHORT TERM GOAL #4   Title  Pt will be able to sweep with supervision.    Time  6    Period  Weeks    Status  New        OT Long Term Goals - 01/05/19 2024      OT LONG TERM GOAL #1   Title  Pt will verbalize understanding of appropriate community fitness activities and updated HEP.--check LTGs 04/05/19    Time  12     Period  Weeks    Status  New      OT LONG TERM GOAL #2   Title  Pt will demo at least 125* L shoulder flex without pain for functional reaching.    Time  12    Period  Weeks    Status  New      OT LONG TERM GOAL #3   Title  Pt will improve bilateral hand coordination for ADLs as shown by decr time on 9-hole peg test by at least 5sec.    Baseline  R-35.63, L-40.90sec    Time  12    Period  Weeks      OT LONG TERM GOAL #4   Title  Pt will improve L grip strength by at least 8lbs to assist with opening containers/bottles.    Baseline  R-25.5, L-9.9lbs    Time  12    Period  Weeks    Status  New      OT LONG TERM GOAL #5   Title  Pt will be able to carry plate/cup at least 15 feet without LOB or spills.    Time  12    Period  Weeks    Status  New      OT LONG TERM GOAL #6   Title  Pt will be able to sweep mod I.    Time  12    Period  Weeks    Status  New            Plan - 02/09/19 1237    Clinical Impression Statement  Pt is progressing towards goals with improved finger flex and grip after Fludio/stretching.  Pt demo improving coordination as well.    OT Occupational Profile and History  Detailed Assessment- Review of Records and additional review of physical, cognitive, psychosocial history related to current functional performance    Occupational performance deficits (Please refer to evaluation for details):  ADL's;IADL's    Body Structure / Function / Physical Skills  Mobility;ADL;Balance;Endurance;Strength;UE functional use;ROM;Dexterity;Pain;FMC;Coordination;Decreased knowledge of use of DME;IADL;Sensation    Cognitive Skills  Memory    Rehab Potential  Good    Clinical Decision Making  Several treatment options, min-mod task modification necessary    Comorbidities Affecting Occupational Performance:  May have comorbidities impacting occupational performance  Modification or Assistance to Complete Evaluation   Min-Moderate modification of tasks or assist with  assess necessary to complete eval    OT Frequency  1x / week   due to financial concerns at pt/husband request   OT Duration  12 weeks   +eval   OT Treatment/Interventions  Self-care/ADL training;Therapeutic exercise;Functional Mobility Training;Manual Therapy;Splinting;Neuromuscular education;Ultrasound;Energy conservation;Therapeutic activities;Cognitive remediation/compensation;DME and/or AE instruction;Paraffin;Cryotherapy;Fluidtherapy;Patient/family education;Passive range of motion;Moist Heat    Plan  shoulder ROM/functional reach, continue to address hand function/coordination; begin checking STGs    Consulted and Agree with Plan of Care  Patient;Family member/caregiver    Family Member Consulted  husband       Patient will benefit from skilled therapeutic intervention in order to improve the following deficits and impairments:   Body Structure / Function / Physical Skills: Mobility, ADL, Balance, Endurance, Strength, UE functional use, ROM, Dexterity, Pain, FMC, Coordination, Decreased knowledge of use of DME, IADL, Sensation Cognitive Skills: Memory     Visit Diagnosis: Hemiplegia and hemiparesis following cerebral infarction affecting right dominant side (HCC)  Other lack of coordination  Other symptoms and signs involving cognitive functions following cerebral infarction  Stiffness of left hand, not elsewhere classified  Stiffness of right hand, not elsewhere classified  Stiffness of left shoulder, not elsewhere classified  Other abnormalities of gait and mobility  Unsteadiness on feet  Muscle weakness (generalized)    Problem List Patient Active Problem List   Diagnosis Date Noted  . Seizure prophylaxis--probable seizure 04/12/2018  . Sacral decubitus ulcer, stage IV ([place]) 04/12/2018  . Acute blood loss anemia   . Impulsive   . Hypokalemia   . ETOH abuse   . Dysphagia, post-stroke   . Benign essential HTN   . Acute deep vein thrombosis (DVT) of distal  vein of both lower extremities (HCC)   . Sinus tachycardia   . Esophageal perforation   . Perforation esophagus   . Sepsis with acute renal failure ([place])   . Acute deep vein thrombosis (DVT) of both peroneal veins ([place])   . Leukocytosis   . Sepsis due to Pseudomonas species ([place])   . Cerebral embolism with cerebral infarction 03/09/2018  . Malnutrition of moderate degree 03/08/2018  . Empyema ([place]) 03/04/2018  . Pressure injury of skin 03/04/2018  . Acute respiratory failure with hypercapnia ([place]) 03/04/2018  . Diarrhea 02/26/2018  . Pancytopenia ([place]) 02/19/2018  . Hypotension 02/19/2018  . Severe protein-calorie malnutrition ([place]) 02/17/2018  . Colitis 02/16/2018  . Weakness 02/16/2018  . Nausea vomiting and diarrhea 02/16/2018  . Diabetes ([place]) 02/16/2018  . Essential hypertension 02/16/2018  . Anemia 02/16/2018    [facility] 02/09/2019, 12:38 PM  [facility] [address] [place], [place], [address] Phone: [phone]   Fax:  [phone]  Name: Maria Avila MRN: [id number] Date of Birth: Dec 05, 1950   Vianne Bulls, OTR/L [facility] [address] [place], [place]  [address] [phone] phone [phone] 02/09/19 12:39 PM

## 2019-02-10 NOTE — Therapy (Signed)
[facility] [address] [place], [place], [address] Phone: [phone]   Fax:  [phone]  Physical Therapy Treatment  Patient Details  Name: Maria Avila MRN: [id number] Date of Birth: January 04, 1951 Referring Provider (PT): Jacobo Forest, MD   Encounter Date: 02/09/2019  PT End of Session - 02/09/19 1601    Visit Number  6    Number of Visits  13    Date for PT Re-Evaluation  04/01/19    Authorization Type  UHC Medicare  $30 co-pay PT & OT ea. so $60 for both    Authorization Time Period  OOP $3600  $2190.16 met  VL:MN    PT Start Time  1145    PT Stop Time  1228    PT Time Calculation (min)  43 min    Equipment Utilized During Treatment  Gait belt    Activity Tolerance  Patient tolerated treatment well;Patient limited by fatigue    Behavior During Therapy  [facility] for tasks assessed/performed;Flat affect       Past Medical History:  Diagnosis Date  . Colitis   . Diabetes mellitus without complication ([place])   . Hypertension   . Malnutrition ([place])   . Stroke [facility])     Past Surgical History:  Procedure Laterality Date  . BIOPSY  02/26/2018   Procedure: BIOPSY;  Surgeon: Wilford Corner, MD;  Location: [facility];  Service: Endoscopy;;  . COLONOSCOPY WITH PROPOFOL N/A 02/26/2018   Procedure: COLONOSCOPY WITH PROPOFOL;  Surgeon: Wilford Corner, MD;  Location: [facility];  Service: Endoscopy;  Laterality: N/A;  . ESOPHAGOGASTRODUODENOSCOPY (EGD) WITH PROPOFOL N/A 02/26/2018   Procedure: ESOPHAGOGASTRODUODENOSCOPY (EGD) WITH PROPOFOL;  Surgeon: Wilford Corner, MD;  Location: [facility];  Service: Endoscopy;  Laterality: N/A;  . NO PAST SURGERIES     UNCERTAIN OF NAME OF SURGERY    There were no vitals filed for this visit.  Subjective Assessment - 02/09/19 1151    Subjective  No falls. She completed paperwork for [facility] & plans to start tomorrow.    Patient is accompained by:  Family member    husband   Pertinent History  Bil CVAs, DM, HTN, Malnutrition    Patient Stated Goals  To walk without walker.    Currently in Pain?  No/denies    Pain Onset  More than a month ago                       [facility] Adult PT Treatment/Exercise - 02/09/19 1145      Neuro Re-ed    Neuro Re-ed Details   sitting on 24" bar stool with feet supported on floor without UE support:  forward lean to upright, back lean to upright, rotation right & left and sidebend right & left.  PT demo, tactile & verbal cues for movements, upright posture & balance reactions.  Sit to stand without UE assist 10 reps with minA twice to staibilize   after activity: HR78 SpO2 97%     Knee/Hip Exercises: Aerobic   Other Aerobic  Scifit level 1.5 all 4 extremities x 8 minutes at rpm>55-60.  RHR 53 SaO2 98%, EHR 68 SpO2 98%   advised intiat [employer] 2 sets of 8 min with 2-3 minute rest b/w         Balance Exercises - 02/09/19 1145      Balance Exercises: Standing   Standing Eyes Opened  Wide (BOA);Head turns;Other reps (comment);Foam/compliant surface   crossways on foam  beam,feet hip width, tactile & verba lcues   Stepping Strategy  Anterior;Posterior;Foam/compliant surface;5 reps   modA, verbal, mirror/visual & manual cues   Rockerboard  Anterior/posterior;Lateral;EO;20 seconds;Intermittent UE support;Other (comment)   manual & mirror/visual cues, wt shift/stabilization/recovery   Overall Comments  Other (comment)   After balance exercises HR 72, SpO2 98%         PT Short Term Goals - 02/02/19 1359      PT SHORT TERM GOAL #1   Title  Timed Up & Go without device <15seconds with no balance losses.    Time  4    Period  Weeks    Status  New    Target Date  03/04/19      PT SHORT TERM GOAL #2   Title  Patient ambulates 200' outdoors on paved surfaces & negotiates ramps/curbs with RW with supervision.    Time  4    Period  Weeks    Status  New    Target Date  03/04/19      PT SHORT TERM  GOAL #3   Title  Patient ambulates 47' around obstacles without device with minA    Time  4    Period  Weeks    Status  New    Target Date  03/04/19        PT Long Term Goals - 01/05/19 2215      PT LONG TERM GOAL #1   Title  Patient verbalizes & demonstrates understanding of ongoing HEP including community fitness. (All LTGs Target Date: 04/04/2019)    Time  12    Period  Weeks    Status  New    Target Date  04/04/19      PT LONG TERM GOAL #2   Title  Berg Balance score >36/56 to indicate lower fall risk.    Time  12    Period  Weeks    Status  New    Target Date  04/04/19      PT LONG TERM GOAL #3   Title  Timed Up & Go without device <13.5sec safely    Time  12    Period  Weeks    Status  New    Target Date  04/04/19      PT LONG TERM GOAL #4   Title  Patient ambulates community distances 150'  & negotiates ramps/ curbs with RW modified independent.    Time  12    Period  Weeks    Status  New    Target Date  04/04/19      PT LONG TERM GOAL #5   Title  Patient ambulates 100' around furniture without device modified independent.    Time  12    Period  Weeks    Status  New    Target Date  04/04/19            Plan - 02/10/19 1243    Clinical Impression Statement  PT session focused on balance activities to facilitate ankle, hip & step strategies. She requires assistance to prevent falls but improved with instruction & practice.    Personal Factors and Comorbidities  Age;Comorbidity 3+;Fitness;Past/Current Experience;Time since onset of injury/illness/exacerbation    Comorbidities  Bil CVAs, DM, HTN, Malnutrition    Examination-Activity Limitations  Carry;Locomotion Level;Reach Overhead;Squat;Stairs;Stand;Transfers    Examination-Participation Restrictions  Community Activity    Stability/Clinical Decision Making  Evolving/Moderate complexity    Rehab Potential  Good    PT Frequency  1x /  week    PT Duration  12 weeks    PT Treatment/Interventions   ADLs/Self Care Home Management;DME Instruction;Gait training;Stair training;Functional mobility training;Therapeutic activities;Therapeutic exercise;Balance training;Neuromuscular re-education;Patient/family education;Vestibular    PT Next Visit Plan  monitor HR & O2 sat, work towards updated STGs, check if has started at [facility] yet    Consulted and Agree with Plan of Care  Patient       Patient will benefit from skilled therapeutic intervention in order to improve the following deficits and impairments:  Abnormal gait, Cardiopulmonary status limiting activity, Decreased activity tolerance, Decreased balance, Decreased endurance, Decreased knowledge of use of DME, Decreased mobility, Decreased range of motion, Decreased strength, Dizziness, Postural dysfunction, Pain  Visit Diagnosis: Hemiplegia and hemiparesis following cerebral infarction affecting right dominant side (HCC)  Muscle weakness (generalized)  Other lack of coordination  Other symptoms and signs involving cognitive functions following cerebral infarction  Other abnormalities of gait and mobility  Unsteadiness on feet     Problem List Patient Active Problem List   Diagnosis Date Noted  . Seizure prophylaxis--probable seizure 04/12/2018  . Sacral decubitus ulcer, stage IV ([place]) 04/12/2018  . Acute blood loss anemia   . Impulsive   . Hypokalemia   . ETOH abuse   . Dysphagia, post-stroke   . Benign essential HTN   . Acute deep vein thrombosis (DVT) of distal vein of both lower extremities (HCC)   . Sinus tachycardia   . Esophageal perforation   . Perforation esophagus   . Sepsis with acute renal failure ([place])   . Acute deep vein thrombosis (DVT) of both peroneal veins ([place])   . Leukocytosis   . Sepsis due to Pseudomonas species ([place])   . Cerebral embolism with cerebral infarction 03/09/2018  . Malnutrition of moderate degree 03/08/2018  . Empyema ([place]) 03/04/2018  . Pressure injury of skin 03/04/2018  . Acute  respiratory failure with hypercapnia ([place]) 03/04/2018  . Diarrhea 02/26/2018  . Pancytopenia ([place]) 02/19/2018  . Hypotension 02/19/2018  . Severe protein-calorie malnutrition ([place]) 02/17/2018  . Colitis 02/16/2018  . Weakness 02/16/2018  . Nausea vomiting and diarrhea 02/16/2018  . Diabetes ([place]) 02/16/2018  . Essential hypertension 02/16/2018  . Anemia 02/16/2018    Shivaay Stormont PT, DPT 02/10/2019, 12:44 PM  [facility] [address] [place], [place], [address] Phone: [phone]   Fax:  [phone]  Name: Maria Avila MRN: [id number] Date of Birth: 12-02-1950

## 2019-02-16 ENCOUNTER — Other Ambulatory Visit: Payer: Self-pay

## 2019-02-16 ENCOUNTER — Encounter: Payer: Self-pay | Admitting: Physical Therapy

## 2019-02-16 ENCOUNTER — Ambulatory Visit: Payer: Medicare Other | Admitting: Physical Therapy

## 2019-02-16 ENCOUNTER — Ambulatory Visit: Payer: Medicare Other | Admitting: Occupational Therapy

## 2019-02-16 DIAGNOSIS — M6281 Muscle weakness (generalized): Secondary | ICD-10-CM | POA: Diagnosis not present

## 2019-02-16 DIAGNOSIS — R2681 Unsteadiness on feet: Secondary | ICD-10-CM

## 2019-02-16 DIAGNOSIS — I69351 Hemiplegia and hemiparesis following cerebral infarction affecting right dominant side: Secondary | ICD-10-CM

## 2019-02-16 DIAGNOSIS — M25642 Stiffness of left hand, not elsewhere classified: Secondary | ICD-10-CM | POA: Diagnosis not present

## 2019-02-16 DIAGNOSIS — M25612 Stiffness of left shoulder, not elsewhere classified: Secondary | ICD-10-CM

## 2019-02-16 DIAGNOSIS — M25641 Stiffness of right hand, not elsewhere classified: Secondary | ICD-10-CM

## 2019-02-16 DIAGNOSIS — I69318 Other symptoms and signs involving cognitive functions following cerebral infarction: Secondary | ICD-10-CM | POA: Diagnosis not present

## 2019-02-16 DIAGNOSIS — R278 Other lack of coordination: Secondary | ICD-10-CM

## 2019-02-16 DIAGNOSIS — R2689 Other abnormalities of gait and mobility: Secondary | ICD-10-CM

## 2019-02-16 NOTE — Therapy (Signed)
[facility] [address] [place], [place], [address] Phone: [phone]   Fax:  [phone]  Occupational Therapy Treatment  Patient Details  Name: Maria Avila MRN: [id number] Date of Birth: June 15, 1950 No data recorded  Encounter Date: 02/16/2019  OT End of Session - 02/16/19 1237    Visit Number  7    Number of Visits  13    Date for OT Re-Evaluation  04/05/19    Authorization Type  [employer]HC Medicare, $30 copay each OT/PT (seperate)    Authorization - Visit Number  7    Authorization - Number of Visits  10    OT Start Time  1234    OT Stop Time  1312    OT Time Calculation (min)  38 min    Activity Tolerance  Patient tolerated treatment well    Behavior During Therapy  [facility] for tasks assessed/performed       Past Medical History:  Diagnosis Date  . Colitis   . Diabetes mellitus without complication ([place])   . Hypertension   . Malnutrition ([place])   . Stroke [facility])     Past Surgical History:  Procedure Laterality Date  . BIOPSY  02/26/2018   Procedure: BIOPSY;  Surgeon: Wilford Corner, MD;  Location: [facility];  Service: Endoscopy;;  . COLONOSCOPY WITH PROPOFOL N/A 02/26/2018   Procedure: COLONOSCOPY WITH PROPOFOL;  Surgeon: Wilford Corner, MD;  Location: [place];  Service: Endoscopy;  Laterality: N/A;  . ESOPHAGOGASTRODUODENOSCOPY (EGD) WITH PROPOFOL N/A 02/26/2018   Procedure: ESOPHAGOGASTRODUODENOSCOPY (EGD) WITH PROPOFOL;  Surgeon: Wilford Corner, MD;  Location: [facility];  Service: Endoscopy;  Laterality: N/A;  . NO PAST SURGERIES     UNCERTAIN OF NAME OF SURGERY    There were no vitals filed for this visit.  Subjective Assessment - 02/16/19 1228    Subjective   Pt reports that she went to the [facility] for the first time and rode the bike    Pertinent History  bilateral infarcts fall 2019.    PMH:  hx of questionable seizures, malnutrition, hypertension, diabetes, colitis, hx of heavy  alcohol use, DM, HTN, hx of empyema/respiratory failure and sepsis 10/19    Limitations  fall risk, questionable seizures    Patient Stated Goals  improve ability to use hands, open bottle, sweep    Currently in Pain?  No/denies    Pain Onset  More than a month ago          Fludio x 17mn to both hands for pain, stiffness with no adverse reactions.    PROM to L hand in composite finger flex, IP flex followed by place and holds.  AROM composite flex, IP flex, and MP flex with min cueing.  Picking up blocks with gripper set on level 1 (black spring) for sustained grip strength with min difficulty with each hand (all with each hand).  Placing grooved pegs in pegboard with each hand with mod difficulty, min cues.  Standing, sweeping with supervision and min v.c. for review of strategies (sitting to use dustpan), use of one hand or standing against counter prn.  No LOB and did not need UE support.  Pt educated in benefit/use of walker tray to incr safety/independence with carrying things (also discussed walker basket/bag).  Pt verbalized understanding.          OT Short Term Goals - 02/16/19 1254      OT SHORT TERM GOAL #1   Title  Pt will be independent  with intitial HEP.--check STGs 02/23/19    Time  6    Period  Weeks    Status  Achieved      OT SHORT TERM GOAL #2   Title  Pt will demo at least 120* L shoulder flex for functional reaching without pain.    Baseline  110* scaption    Time  6    Period  Weeks    Status  On-going   02/09/19  110* scaption with no pain     OT SHORT TERM GOAL #3   Title  Pt will demo at least 75% L gross finger flex for improved grasp.    Baseline  50%    Time  6    Period  Weeks    Status  Achieved   02/09/19:  met     OT SHORT TERM GOAL #4   Title  Pt will be able to sweep with supervision.    Time  6    Period  Weeks    Status  Partially Met   02/06/19:  met in clinic       OT Long Term Goals - 01/05/19 2024      OT LONG TERM  GOAL #1   Title  Pt will verbalize understanding of appropriate community fitness activities and updated HEP.--check LTGs 04/05/19    Time  12    Period  Weeks    Status  New      OT LONG TERM GOAL #2   Title  Pt will demo at least 125* L shoulder flex without pain for functional reaching.    Time  12    Period  Weeks    Status  New      OT LONG TERM GOAL #3   Title  Pt will improve bilateral hand coordination for ADLs as shown by decr time on 9-hole peg test by at least 5sec.    Baseline  R-35.63, L-40.90sec    Time  12    Period  Weeks      OT LONG TERM GOAL #4   Title  Pt will improve L grip strength by at least 8lbs to assist with opening containers/bottles.    Baseline  R-25.5, L-9.9lbs    Time  12    Period  Weeks    Status  New      OT LONG TERM GOAL #5   Title  Pt will be able to carry plate/cup at least 15 feet without LOB or spills.    Time  12    Period  Weeks    Status  New      OT LONG TERM GOAL #6   Title  Pt will be able to sweep mod I.    Time  12    Period  Weeks    Status  New            Plan - 02/16/19 1237    Clinical Impression Statement  Pt is progressing towards goals with improving balance, strength, and coordination.    OT Occupational Profile and History  Detailed Assessment- Review of Records and additional review of physical, cognitive, psychosocial history related to current functional performance    Occupational performance deficits (Please refer to evaluation for details):  ADL's;IADL's    Body Structure / Function / Physical Skills  Mobility;ADL;Balance;Endurance;Strength;UE functional use;ROM;Dexterity;Pain;FMC;Coordination;Decreased knowledge of use of DME;IADL;Sensation    Cognitive Skills  Memory    Rehab Potential  Good    Clinical Decision  Making  Several treatment options, min-mod task modification necessary    Comorbidities Affecting Occupational Performance:  May have comorbidities impacting occupational performance     Modification or Assistance to Complete Evaluation   Min-Moderate modification of tasks or assist with assess necessary to complete eval    OT Frequency  1x / week   due to financial concerns at pt/husband request   OT Duration  12 weeks   +eval   OT Treatment/Interventions  Self-care/ADL training;Therapeutic exercise;Functional Mobility Training;Manual Therapy;Splinting;Neuromuscular education;Ultrasound;Energy conservation;Therapeutic activities;Cognitive remediation/compensation;DME and/or AE instruction;Paraffin;Cryotherapy;Fluidtherapy;Patient/family education;Passive range of motion;Moist Heat    Plan  shoulder ROM/functional reach, continue to address hand function/coordination, standing balance/carrying something in 1 hand    Consulted and Agree with Plan of Care  Patient;Family member/caregiver    Family Member Consulted  husband       Patient will benefit from skilled therapeutic intervention in order to improve the following deficits and impairments:   Body Structure / Function / Physical Skills: Mobility, ADL, Balance, Endurance, Strength, UE functional use, ROM, Dexterity, Pain, FMC, Coordination, Decreased knowledge of use of DME, IADL, Sensation Cognitive Skills: Memory     Visit Diagnosis: Hemiplegia and hemiparesis following cerebral infarction affecting right dominant side (HCC)  Muscle weakness (generalized)  Other lack of coordination  Other symptoms and signs involving cognitive functions following cerebral infarction  Other abnormalities of gait and mobility  Unsteadiness on feet  Stiffness of left hand, not elsewhere classified  Stiffness of right hand, not elsewhere classified  Stiffness of left shoulder, not elsewhere classified    Problem List Patient Active Problem List   Diagnosis Date Noted  . Seizure prophylaxis--probable seizure 04/12/2018  . Sacral decubitus ulcer, stage IV ([place]) 04/12/2018  . Acute blood loss anemia   . Impulsive   .  Hypokalemia   . ETOH abuse   . Dysphagia, post-stroke   . Benign essential HTN   . Acute deep vein thrombosis (DVT) of distal vein of both lower extremities (HCC)   . Sinus tachycardia   . Esophageal perforation   . Perforation esophagus   . Sepsis with acute renal failure ([place])   . Acute deep vein thrombosis (DVT) of both peroneal veins ([place])   . Leukocytosis   . Sepsis due to Pseudomonas species ([place])   . Cerebral embolism with cerebral infarction 03/09/2018  . Malnutrition of moderate degree 03/08/2018  . Empyema ([place]) 03/04/2018  . Pressure injury of skin 03/04/2018  . Acute respiratory failure with hypercapnia ([place]) 03/04/2018  . Diarrhea 02/26/2018  . Pancytopenia ([place]) 02/19/2018  . Hypotension 02/19/2018  . Severe protein-calorie malnutrition ([place]) 02/17/2018  . Colitis 02/16/2018  . Weakness 02/16/2018  . Nausea vomiting and diarrhea 02/16/2018  . Diabetes ([place]) 02/16/2018  . Essential hypertension 02/16/2018  . Anemia 02/16/2018    [facility] 02/16/2019, 3:18 PM  [facility] [address] [place], [place], [address] Phone: [phone]   Fax:  [phone]  Name: Maria Avila MRN: [id number] Date of Birth: [birth year]   Vianne Bulls, OTR/L [facility] [address] [place], [place]  [address] [phone] phone [phone] 02/16/19 3:18 PM

## 2019-02-16 NOTE — Therapy (Signed)
[facility] [address] [place], [place], [address] Phone: [phone]   Fax:  [phone]  Physical Therapy Treatment  Patient Details  Name: Maria Avila MRN: [id number] Date of Birth: February 14, 1951 Referring Provider (PT): Jacobo Forest, MD   Encounter Date: 02/16/2019  PT End of Session - 02/16/19 1252    Visit Number  7    Number of Visits  13    Date for PT Re-Evaluation  04/01/19    Authorization Type  UHC Medicare  $30 co-pay PT & OT ea. so $60 for both    Authorization Time Period  OOP $3600  $2190.16 met  VL:MN    PT Start Time  1150    PT Stop Time  1230    PT Time Calculation (min)  40 min    Equipment Utilized During Treatment  Gait belt    Activity Tolerance  Patient tolerated treatment well;Patient limited by fatigue    Behavior During Therapy  [facility] for tasks assessed/performed;Flat affect       Past Medical History:  Diagnosis Date  . Colitis   . Diabetes mellitus without complication ([place])   . Hypertension   . Malnutrition ([place])   . Stroke [facility])     Past Surgical History:  Procedure Laterality Date  . BIOPSY  02/26/2018   Procedure: BIOPSY;  Surgeon: Wilford Corner, MD;  Location: [place];  Service: Endoscopy;;  . COLONOSCOPY WITH PROPOFOL N/A 02/26/2018   Procedure: COLONOSCOPY WITH PROPOFOL;  Surgeon: Wilford Corner, MD;  Location: [facility];  Service: Endoscopy;  Laterality: N/A;  . ESOPHAGOGASTRODUODENOSCOPY (EGD) WITH PROPOFOL N/A 02/26/2018   Procedure: ESOPHAGOGASTRODUODENOSCOPY (EGD) WITH PROPOFOL;  Surgeon: Wilford Corner, MD;  Location: [place];  Service: Endoscopy;  Laterality: N/A;  . NO PAST SURGERIES     UNCERTAIN OF NAME OF SURGERY    There were no vitals filed for this visit.  Subjective Assessment - 02/16/19 1152    Subjective  She went to [facility] yesterday & did recumbent bike with UEs/LEs for 30 minutes totat with breaks. Small muscle soreness  today but no other issues.  No falls. She has been walking in kitchen without device but uses RW for room to room ambulation or community outings.    Patient is accompained by:  Family member   husband   Pertinent History  Bil CVAs, DM, HTN, Malnutrition    Patient Stated Goals  To walk without walker.    Currently in Pain?  No/denies    Pain Onset  More than a month ago                       [facility] Adult PT Treatment/Exercise - 02/16/19 1150      Transfers   Sit to Stand  5: Supervision;With upper extremity assist;From chair/3-in-1   chairs without armrests   Stand to Sit  5: Supervision;With upper extremity assist;To chair/3-in-1   chair without armressts     Ambulation/Gait   Ambulation/Gait  Yes    Ambulation/Gait Assistance  4: Min guard    Ambulation/Gait Assistance Details  ambulating without device negotiating furniture, carrying cup, then plate then both around tables.     Ambulation Distance (Feet)  100 Feet   100' X 3 no device   Assistive device  None;Rolling walker   arrived & exited with RW   Ambulation Surface  Indoor;Level      Neuro Re-ed    Neuro Re-ed Details  sitting on 24" bar stool with feet supported on floor without UE support:  forward lean to upright, back lean to upright, rotation right & left and sidebend right & left.  PT demo, tactile & verbal cues for movements, upright posture & balance reactions.  Sit to stand without UE assist 10 reps with minA twice to staibilize   after activity: HR78 SpO2 97%     Knee/Hip Exercises: Aerobic   Other Aerobic  --          Balance Exercises - 02/16/19 1150      Balance Exercises: Standing   Standing Eyes Opened  Wide (BOA);Head turns;Other reps (comment);Foam/compliant surface   crossways on foam beam,feet hip width, tactile & verba lcues   Stepping Strategy  --    Rockerboard  Anterior/posterior;Lateral;EO;20 seconds;Intermittent UE support;Other (comment)   manual & mirror/visual cues,  wt shift/stabilization/recovery   Retro Gait  3 reps   intermittent touch on bars   Sidestepping  3 reps   8' intermittent touch on bars   Turning  Right;Left;5 reps   demo, verbal cues on foot movement & wt shift in bars interm   Marching Limitations  touching intermittent on bars, PT manual assist with LOB    Overall Comments  Other (comment)   After balance exercises HR 72, SpO2 98%         PT Short Term Goals - 02/02/19 1359      PT SHORT TERM GOAL #1   Title  Timed Up & Go without device <15seconds with no balance losses.    Time  4    Period  Weeks    Status  New    Target Date  03/04/19      PT SHORT TERM GOAL #2   Title  Patient ambulates 200' outdoors on paved surfaces & negotiates ramps/curbs with RW with supervision.    Time  4    Period  Weeks    Status  New    Target Date  03/04/19      PT SHORT TERM GOAL #3   Title  Patient ambulates 91' around obstacles without device with minA    Time  4    Period  Weeks    Status  New    Target Date  03/04/19        PT Long Term Goals - 01/05/19 2215      PT LONG TERM GOAL #1   Title  Patient verbalizes & demonstrates understanding of ongoing HEP including community fitness. (All LTGs Target Date: 04/04/2019)    Time  12    Period  Weeks    Status  New    Target Date  04/04/19      PT LONG TERM GOAL #2   Title  Berg Balance score >36/56 to indicate lower fall risk.    Time  12    Period  Weeks    Status  New    Target Date  04/04/19      PT LONG TERM GOAL #3   Title  Timed Up & Go without device <13.5sec safely    Time  12    Period  Weeks    Status  New    Target Date  04/04/19      PT LONG TERM GOAL #4   Title  Patient ambulates community distances 150'  & negotiates ramps/ curbs with RW modified independent.    Time  12    Period  Weeks  Status  New    Target Date  04/04/19      PT LONG TERM GOAL #5   Title  Patient ambulates 100' around furniture without device modified independent.     Time  12    Period  Weeks    Status  New    Target Date  04/04/19            Plan - 02/16/19 1722    Clinical Impression Statement  PT worked on gait without assistive device simulating household mobility including negotiating furntiture & carrying cup/plate.  She fatigued near end of session with instaility but no balance loss in gait.  PT also worked on advanced balance activities to facilitate ankle & hip strategies.    Personal Factors and Comorbidities  Age;Comorbidity 3+;Fitness;Past/Current Experience;Time since onset of injury/illness/exacerbation    Comorbidities  Bil CVAs, DM, HTN, Malnutrition    Examination-Activity Limitations  Carry;Locomotion Level;Reach Overhead;Squat;Stairs;Stand;Transfers    Examination-Participation Restrictions  Community Activity    Stability/Clinical Decision Making  Evolving/Moderate complexity    Rehab Potential  Good    PT Frequency  1x / week    PT Duration  12 weeks    PT Treatment/Interventions  ADLs/Self Care Home Management;DME Instruction;Gait training;Stair training;Functional mobility training;Therapeutic activities;Therapeutic exercise;Balance training;Neuromuscular re-education;Patient/family education;Vestibular    PT Next Visit Plan  monitor HR & O2 sat, work towards updated STGs, balance activities to facilitate ankle, hip & step strategies.    Consulted and Agree with Plan of Care  Patient       Patient will benefit from skilled therapeutic intervention in order to improve the following deficits and impairments:  Abnormal gait, Cardiopulmonary status limiting activity, Decreased activity tolerance, Decreased balance, Decreased endurance, Decreased knowledge of use of DME, Decreased mobility, Decreased range of motion, Decreased strength, Dizziness, Postural dysfunction, Pain  Visit Diagnosis: Hemiplegia and hemiparesis following cerebral infarction affecting right dominant side (HCC)  Muscle weakness (generalized)  Other lack  of coordination  Other symptoms and signs involving cognitive functions following cerebral infarction  Other abnormalities of gait and mobility  Unsteadiness on feet     Problem List Patient Active Problem List   Diagnosis Date Noted  . Seizure prophylaxis--probable seizure 04/12/2018  . Sacral decubitus ulcer, stage IV ([place]) 04/12/2018  . Acute blood loss anemia   . Impulsive   . Hypokalemia   . ETOH abuse   . Dysphagia, post-stroke   . Benign essential HTN   . Acute deep vein thrombosis (DVT) of distal vein of both lower extremities (HCC)   . Sinus tachycardia   . Esophageal perforation   . Perforation esophagus   . Sepsis with acute renal failure ([place])   . Acute deep vein thrombosis (DVT) of both peroneal veins ([place])   . Leukocytosis   . Sepsis due to Pseudomonas species ([place])   . Cerebral embolism with cerebral infarction 03/09/2018  . Malnutrition of moderate degree 03/08/2018  . Empyema ([place]) 03/04/2018  . Pressure injury of skin 03/04/2018  . Acute respiratory failure with hypercapnia ([place]) 03/04/2018  . Diarrhea 02/26/2018  . Pancytopenia ([place]) 02/19/2018  . Hypotension 02/19/2018  . Severe protein-calorie malnutrition ([place]) 02/17/2018  . Colitis 02/16/2018  . Weakness 02/16/2018  . Nausea vomiting and diarrhea 02/16/2018  . Diabetes ([place]) 02/16/2018  . Essential hypertension 02/16/2018  . Anemia 02/16/2018    Midori Dado PT, DPT 02/16/2019, 5:26 PM  [facility] [address] [place], [place], [address] Phone: [phone]   Fax:  [place]  Name: Natane E Avila MRN: [id number] Date of Birth: Jun 17, 1950

## 2019-02-17 DIAGNOSIS — I639 Cerebral infarction, unspecified: Secondary | ICD-10-CM | POA: Diagnosis not present

## 2019-02-17 DIAGNOSIS — L89154 Pressure ulcer of sacral region, stage 4: Secondary | ICD-10-CM | POA: Diagnosis not present

## 2019-02-23 ENCOUNTER — Ambulatory Visit: Payer: Medicare Other | Admitting: Physical Therapy

## 2019-02-23 ENCOUNTER — Other Ambulatory Visit: Payer: Self-pay

## 2019-02-23 ENCOUNTER — Ambulatory Visit: Payer: Medicare Other | Admitting: Occupational Therapy

## 2019-02-23 ENCOUNTER — Encounter: Payer: Self-pay | Admitting: Physical Therapy

## 2019-02-23 DIAGNOSIS — R2681 Unsteadiness on feet: Secondary | ICD-10-CM

## 2019-02-23 DIAGNOSIS — I69351 Hemiplegia and hemiparesis following cerebral infarction affecting right dominant side: Secondary | ICD-10-CM

## 2019-02-23 DIAGNOSIS — I69318 Other symptoms and signs involving cognitive functions following cerebral infarction: Secondary | ICD-10-CM

## 2019-02-23 DIAGNOSIS — R278 Other lack of coordination: Secondary | ICD-10-CM

## 2019-02-23 DIAGNOSIS — M25612 Stiffness of left shoulder, not elsewhere classified: Secondary | ICD-10-CM | POA: Diagnosis not present

## 2019-02-23 DIAGNOSIS — M25642 Stiffness of left hand, not elsewhere classified: Secondary | ICD-10-CM | POA: Diagnosis not present

## 2019-02-23 DIAGNOSIS — M25641 Stiffness of right hand, not elsewhere classified: Secondary | ICD-10-CM | POA: Diagnosis not present

## 2019-02-23 DIAGNOSIS — M6281 Muscle weakness (generalized): Secondary | ICD-10-CM

## 2019-02-23 DIAGNOSIS — R2689 Other abnormalities of gait and mobility: Secondary | ICD-10-CM | POA: Diagnosis not present

## 2019-02-23 NOTE — Therapy (Signed)
[facility] [address] [place], [place], [address] Phone: [phone]   Fax:  [phone]  Physical Therapy Treatment  Patient Details  Name: Maria Avila MRN: [id number] Date of Birth: 18-Feb-1951 Referring Provider (PT): Jacobo Forest, MD   Encounter Date: 02/23/2019  PT End of Session - 02/23/19 1234    Visit Number  8    Number of Visits  13    Date for PT Re-Evaluation  04/01/19    Authorization Type  UHC Medicare  $30 co-pay PT & OT ea. so $60 for both    Authorization Time Period  OOP $3600  $2190.16 met  VL:MN    PT Start Time  1147    PT Stop Time  1230    PT Time Calculation (min)  43 min    Equipment Utilized During Treatment  Gait belt    Activity Tolerance  Patient tolerated treatment well;Patient limited by fatigue    Behavior During Therapy  [facility] for tasks assessed/performed;Flat affect       Past Medical History:  Diagnosis Date  . Colitis   . Diabetes mellitus without complication ([place])   . Hypertension   . Malnutrition ([place])   . Stroke [facility])     Past Surgical History:  Procedure Laterality Date  . BIOPSY  02/26/2018   Procedure: BIOPSY;  Surgeon: Wilford Corner, MD;  Location: [place];  Service: Endoscopy;;  . COLONOSCOPY WITH PROPOFOL N/A 02/26/2018   Procedure: COLONOSCOPY WITH PROPOFOL;  Surgeon: Wilford Corner, MD;  Location: [place];  Service: Endoscopy;  Laterality: N/A;  . ESOPHAGOGASTRODUODENOSCOPY (EGD) WITH PROPOFOL N/A 02/26/2018   Procedure: ESOPHAGOGASTRODUODENOSCOPY (EGD) WITH PROPOFOL;  Surgeon: Wilford Corner, MD;  Location: [place];  Service: Endoscopy;  Laterality: N/A;  . NO PAST SURGERIES     UNCERTAIN OF NAME OF SURGERY    There were no vitals filed for this visit.  Subjective Assessment - 02/23/19 1145    Subjective  She went to [facility] 1x & only did the recumbent stepper for total of 30 minutes with rests.    Patient is accompained by:   Family member   husband   Pertinent History  Bil CVAs, DM, HTN, Malnutrition    Patient Stated Goals  To walk without walker.    Currently in Pain?  No/denies    Pain Onset  More than a month ago                       [facility] Adult PT Treatment/Exercise - 02/23/19 1150      Transfers   Sit to Stand  5: Supervision;With upper extremity assist;From chair/3-in-1   chairs without armrests   Sit to Stand Details  Verbal cues for technique    Stand to Sit  5: Supervision;With upper extremity assist;To chair/3-in-1   chair without armressts   Stand to Sit Details (indicate cue type and reason)  Verbal cues for technique      Ambulation/Gait   Ambulation/Gait  Yes    Ambulation/Gait Assistance  4: Min guard;4: Min assist    Ambulation/Gait Assistance Details  PT worked on gait without assistive device negotiating furniture & scanning environment. Tactile & verbal cues on maintaining pace & path with 1 LOB requiring minA    Ambulation Distance (Feet)  100 Feet   100' X 2 no device   Assistive device  None;Rolling walker   arrived & exited with RW   Ambulation Surface  Indoor;Level  Gait Comments  HR & O2 WNL during gait      Neuro Re-ed    Neuro Re-ed Details   --      Knee/Hip Exercises: Machines for Strengthening   Total Gym Leg Press  50# 10 reps PT instructed in proper determination of weight amount, too little 30# & too heavy 60#.            Balance Exercises - 02/23/19 1150      Balance Exercises: Standing   Standing Eyes Opened  Wide (BOA);Head turns;Foam/compliant surface;5 reps   crossways on foam beam,feet hip width, tactile & verbal cues   Standing Eyes Closed  Wide (BOA);Foam/compliant surface;10 secs;3 reps   modA for balance reaction to prevent posterior LOB   Stepping Strategy  Anterior;Posterior;Lateral;Foam/compliant surface;5 reps   MinA to stabilize; anticipatory reactions   Rockerboard  Anterior/posterior;Lateral;EO;Intermittent UE  support;Other (comment)   manual & mirror/visual cues, wt shift/stabilization/recovery   Retro Gait  1 rep   intermittent touch on bars, tactile cues / minA for LOB   Sidestepping  2 reps   8' with intermittent touch on bars, tactile cues       PT Education - 02/23/19 1220    Education Details  adding weight machines to [facility]. PT recommending going to [facility] 2x/wk in addition to PT    Person(s) Educated  Patient    Methods  Explanation;Demonstration;Tactile cues;Verbal cues    Comprehension  Verbalized understanding;Need further instruction       PT Short Term Goals - 02/02/19 1359      PT SHORT TERM GOAL #1   Title  Timed Up & Go without device <15seconds with no balance losses.    Time  4    Period  Weeks    Status  New    Target Date  03/04/19      PT SHORT TERM GOAL #2   Title  Patient ambulates 200' outdoors on paved surfaces & negotiates ramps/curbs with RW with supervision.    Time  4    Period  Weeks    Status  New    Target Date  03/04/19      PT SHORT TERM GOAL #3   Title  Patient ambulates 41' around obstacles without device with minA    Time  4    Period  Weeks    Status  New    Target Date  03/04/19        PT Long Term Goals - 01/05/19 2215      PT LONG TERM GOAL #1   Title  Patient verbalizes & demonstrates understanding of ongoing HEP including community fitness. (All LTGs Target Date: 04/04/2019)    Time  12    Period  Weeks    Status  New    Target Date  04/04/19      PT LONG TERM GOAL #2   Title  Berg Balance score >36/56 to indicate lower fall risk.    Time  12    Period  Weeks    Status  New    Target Date  04/04/19      PT LONG TERM GOAL #3   Title  Timed Up & Go without device <13.5sec safely    Time  12    Period  Weeks    Status  New    Target Date  04/04/19      PT LONG TERM GOAL #4   Title  Patient ambulates community distances 150'  &  negotiates ramps/ curbs with RW modified independent.    Time  12    Period  Weeks     Status  New    Target Date  04/04/19      PT LONG TERM GOAL #5   Title  Patient ambulates 100' around furniture without device modified independent.    Time  12    Period  Weeks    Status  New    Target Date  04/04/19            Plan - 02/23/19 2143    Clinical Impression Statement  PT educated patient on adding weight machines to [facility] program and increasing to >/= 2x/wk to better agument PT.  Today's skilled session focused on improving her balance facilitating ankle, hip & step strategies.    Personal Factors and Comorbidities  Age;Comorbidity 3+;Fitness;Past/Current Experience;Time since onset of injury/illness/exacerbation    Comorbidities  Bil CVAs, DM, HTN, Malnutrition    Examination-Activity Limitations  Carry;Locomotion Level;Reach Overhead;Squat;Stairs;Stand;Transfers    Examination-Participation Restrictions  Community Activity    Stability/Clinical Decision Making  Evolving/Moderate complexity    Rehab Potential  Good    PT Frequency  1x / week    PT Duration  12 weeks    PT Treatment/Interventions  ADLs/Self Care Home Management;DME Instruction;Gait training;Stair training;Functional mobility training;Therapeutic activities;Therapeutic exercise;Balance training;Neuromuscular re-education;Patient/family education;Vestibular    PT Next Visit Plan  check STGs, monitor HR & O2 sat, work towards LTGs, balance activities to facilitate ankle, hip & step strategies.    Consulted and Agree with Plan of Care  Patient       Patient will benefit from skilled therapeutic intervention in order to improve the following deficits and impairments:  Abnormal gait, Cardiopulmonary status limiting activity, Decreased activity tolerance, Decreased balance, Decreased endurance, Decreased knowledge of use of DME, Decreased mobility, Decreased range of motion, Decreased strength, Dizziness, Postural dysfunction, Pain  Visit Diagnosis: Hemiplegia and hemiparesis following cerebral  infarction affecting right dominant side (HCC)  Muscle weakness (generalized)  Other lack of coordination  Other symptoms and signs involving cognitive functions following cerebral infarction  Other abnormalities of gait and mobility  Unsteadiness on feet     Problem List Patient Active Problem List   Diagnosis Date Noted  . Seizure prophylaxis--probable seizure 04/12/2018  . Sacral decubitus ulcer, stage IV ([place]) 04/12/2018  . Acute blood loss anemia   . Impulsive   . Hypokalemia   . ETOH abuse   . Dysphagia, post-stroke   . Benign essential HTN   . Acute deep vein thrombosis (DVT) of distal vein of both lower extremities (HCC)   . Sinus tachycardia   . Esophageal perforation   . Perforation esophagus   . Sepsis with acute renal failure ([place])   . Acute deep vein thrombosis (DVT) of both peroneal veins ([place])   . Leukocytosis   . Sepsis due to Pseudomonas species ([place])   . Cerebral embolism with cerebral infarction 03/09/2018  . Malnutrition of moderate degree 03/08/2018  . Empyema ([place]) 03/04/2018  . Pressure injury of skin 03/04/2018  . Acute respiratory failure with hypercapnia ([place]) 03/04/2018  . Diarrhea 02/26/2018  . Pancytopenia ([place]) 02/19/2018  . Hypotension 02/19/2018  . Severe protein-calorie malnutrition ([place]) 02/17/2018  . Colitis 02/16/2018  . Weakness 02/16/2018  . Nausea vomiting and diarrhea 02/16/2018  . Diabetes ([place]) 02/16/2018  . Essential hypertension 02/16/2018  . Anemia 02/16/2018    Midori Dado PT, DPT 02/23/2019, 9:46 PM  [facility] [phone]  [place], [place], [address] Phone: [phone]   Fax:  [phone]  Name: Maria Avila MRN: [id number] Date of Birth: 11-23-1950

## 2019-02-23 NOTE — Patient Instructions (Addendum)
Weight machines at [facility] recommend 4-5 lower extremity machines  Ask OT regarding UE machines Use weight that gives some resistance but not too heavy or too light. PT used leg press machine to give patient feedback for proper amount of resistance PT recommended working antagonistic muscle groups Have [employer] staff member instruct in proper machine use

## 2019-03-02 ENCOUNTER — Other Ambulatory Visit: Payer: Self-pay

## 2019-03-02 ENCOUNTER — Ambulatory Visit: Payer: Medicare Other | Admitting: Occupational Therapy

## 2019-03-02 ENCOUNTER — Encounter: Payer: Self-pay | Admitting: Occupational Therapy

## 2019-03-02 ENCOUNTER — Ambulatory Visit: Payer: Medicare Other

## 2019-03-02 VITALS — HR 62

## 2019-03-02 DIAGNOSIS — R278 Other lack of coordination: Secondary | ICD-10-CM

## 2019-03-02 DIAGNOSIS — M6281 Muscle weakness (generalized): Secondary | ICD-10-CM | POA: Diagnosis not present

## 2019-03-02 DIAGNOSIS — M25641 Stiffness of right hand, not elsewhere classified: Secondary | ICD-10-CM | POA: Diagnosis not present

## 2019-03-02 DIAGNOSIS — I69351 Hemiplegia and hemiparesis following cerebral infarction affecting right dominant side: Secondary | ICD-10-CM

## 2019-03-02 DIAGNOSIS — M25612 Stiffness of left shoulder, not elsewhere classified: Secondary | ICD-10-CM

## 2019-03-02 DIAGNOSIS — L89154 Pressure ulcer of sacral region, stage 4: Secondary | ICD-10-CM | POA: Diagnosis not present

## 2019-03-02 DIAGNOSIS — M25642 Stiffness of left hand, not elsewhere classified: Secondary | ICD-10-CM | POA: Diagnosis not present

## 2019-03-02 DIAGNOSIS — R2689 Other abnormalities of gait and mobility: Secondary | ICD-10-CM | POA: Diagnosis not present

## 2019-03-02 DIAGNOSIS — I69318 Other symptoms and signs involving cognitive functions following cerebral infarction: Secondary | ICD-10-CM | POA: Diagnosis not present

## 2019-03-02 DIAGNOSIS — R2681 Unsteadiness on feet: Secondary | ICD-10-CM | POA: Diagnosis not present

## 2019-03-02 DIAGNOSIS — T8189XA Other complications of procedures, not elsewhere classified, initial encounter: Secondary | ICD-10-CM | POA: Diagnosis not present

## 2019-03-02 DIAGNOSIS — I639 Cerebral infarction, unspecified: Secondary | ICD-10-CM | POA: Diagnosis not present

## 2019-03-02 NOTE — Patient Instructions (Signed)
     Strengthening: Resisted Extension   Attach one end to door.  Hold tubing in one hand, arm forward. Pull arm back, elbow straight. Repeat 10 times per set. Do 1 sessions per day.   Resisted Horizontal Abduction: Bilateral   Sit , yellow band in both hands, palms down and arms out in front. Keeping arms straight, pinch shoulder blades together and stretch arms out. Repeat 10 times per set.  Do 1-2 sessions per day.  Then repeat with thumbs facing up.  Hold arms low so that band is just above legs when sitting.  Scapular Retraction: Rowing (Eccentric) - Arms - Side (Resistance Band)    Sit, Hold end of band in each hand. Pull back until elbows are even with trunk. Keep elbows by sides, thumbs up. Slowly release for 3-5 seconds. Use yellow resistance band. 10 reps per set, 1 sets per day   **REPEAT ALL WITH BOTH ARMS.

## 2019-03-02 NOTE — Patient Instructions (Signed)
Access Code: [facility]  URL: [email or web address]  Date: 03/02/2019  Prepared by: Cherly Anderson   Exercises Clamshell - 10 reps - 2 sets - 1x daily - 5x weekly

## 2019-03-02 NOTE — Therapy (Signed)
[facility] [address] [place], [place], [address] Phone: [phone]   Fax:  [phone]  Occupational Therapy Treatment  Patient Details  Name: Maria Avila MRN: [id number] Date of Birth: 12-29-1950 No data recorded  Encounter Date: 03/02/2019  OT End of Session - 03/02/19 1321    Visit Number  8    Number of Visits  13    Date for OT Re-Evaluation  04/05/19    Authorization Type  UHC Medicare, $30 copay each OT/PT (seperate)    Authorization - Visit Number  8    Authorization - Number of Visits  10    OT Start Time  1321    OT Stop Time  1405    OT Time Calculation (min)  44 min    Activity Tolerance  Patient tolerated treatment well    Behavior During Therapy  [facility] for tasks assessed/performed       Past Medical History:  Diagnosis Date  . Colitis   . Diabetes mellitus without complication ([place])   . Hypertension   . Malnutrition ([place])   . Stroke [facility])     Past Surgical History:  Procedure Laterality Date  . BIOPSY  02/26/2018   Procedure: BIOPSY;  Surgeon: Wilford Corner, MD;  Location: [place];  Service: Endoscopy;;  . COLONOSCOPY WITH PROPOFOL N/A 02/26/2018   Procedure: COLONOSCOPY WITH PROPOFOL;  Surgeon: Wilford Corner, MD;  Location: [place];  Service: Endoscopy;  Laterality: N/A;  . ESOPHAGOGASTRODUODENOSCOPY (EGD) WITH PROPOFOL N/A 02/26/2018   Procedure: ESOPHAGOGASTRODUODENOSCOPY (EGD) WITH PROPOFOL;  Surgeon: Wilford Corner, MD;  Location: [place];  Service: Endoscopy;  Laterality: N/A;  . NO PAST SURGERIES     UNCERTAIN OF NAME OF SURGERY    There were no vitals filed for this visit.  Subjective Assessment - 03/02/19 1322    Subjective   Went to the [facility] yesterday (right now aiming for 2x week).  Pt reports that she is doing more dusting.    Pertinent History  bilateral infarcts fall 2019.    PMH:  hx of questionable seizures, malnutrition, hypertension,  diabetes, colitis, hx of heavy alcohol use, DM, HTN, hx of empyema/respiratory failure and sepsis 10/19    Limitations  fall risk, questionable seizures    Patient Stated Goals  improve ability to use hands, open bottle, sweep    Currently in Pain?  No/denies    Pain Onset  More than a month ago         Ambulating with CGA while carrying plate and empty cup in hands with approx 65fet x2 with 1 mild LOB where pt able to recover without assist.    Sitting, closed-chain shoulder flex with ball with BUEs with min-mod facilitation/cueing for positioning/scapular depression.  Then AROM shoulder flex with mod cueing/facilitation scapular positioning.    Arm bike x5 min level 1 for reciprocal movement/conditioning without rest.  In standing, functional reaching with each UE to place small pegs in vertical pegboard for incr coordination, ROM, and activity tolerance/balance.  1 mild LOB where pt able to recover without assist.  Pt with good coordination, but min v.c. for positioning of shoulder.  Pt needed mod cueing for copying peg design.       OT Education - 03/02/19 1441    Education Details  yellow theraband HEP--see pt instructions    Person(s) Educated  Patient    Methods  Explanation;Demonstration;Verbal cues;Handout    Comprehension  Verbalized understanding;Returned demonstration;Verbal cues required  OT Short Term Goals - 03/02/19 1431      OT SHORT TERM GOAL #1   Title  Pt will be independent with intitial HEP.--check STGs 02/23/19    Time  6    Period  Weeks    Status  Achieved      OT SHORT TERM GOAL #2   Title  Pt will demo at least 120* L shoulder flex for functional reaching without pain.    Baseline  110* scaption    Time  6    Period  Weeks    Status  On-going   02/09/19  110* scaption with no pain.  03/02/19:  110-115*     OT SHORT TERM GOAL #3   Title  Pt will demo at least 75% L gross finger flex for improved grasp.    Baseline  50%    Time  6     Period  Weeks    Status  Achieved   02/09/19:  met     OT SHORT TERM GOAL #4   Title  Pt will be able to sweep with supervision.    Time  6    Period  Weeks    Status  Partially Met   02/06/19:  met in clinic       OT Long Term Goals - 03/02/19 1432      OT LONG TERM GOAL #1   Title  Pt will verbalize understanding of appropriate community fitness activities and updated HEP.--check LTGs 04/05/19    Time  12    Period  Weeks    Status  On-going      OT LONG TERM GOAL #2   Title  Pt will demo at least 125* L shoulder flex without pain for functional reaching.    Time  12    Period  Weeks    Status  New      OT LONG TERM GOAL #3   Title  Pt will improve bilateral hand coordination for ADLs as shown by decr time on 9-hole peg test by at least 5sec.    Baseline  R-35.63, L-40.90sec    Time  12    Period  Weeks      OT LONG TERM GOAL #4   Title  Pt will improve L grip strength by at least 8lbs to assist with opening containers/bottles.    Baseline  R-25.5, L-9.9lbs    Time  12    Period  Weeks    Status  New      OT LONG TERM GOAL #5   Title  Pt will be able to carry plate/cup at least 15 feet without LOB or spills.    Time  12    Period  Weeks    Status  New      OT LONG TERM GOAL #6   Title  Pt will be able to sweep mod I.    Time  12    Period  Weeks    Status  New            Plan - 03/02/19 1323    Clinical Impression Statement  Pt is progressing towards goals with improving balance, strength, and coordination.  Pt reports that she is now dusting some at home and will get water, but husband is doing all other meal prep and home maintenance tasks.  Pt is going to [facility] some to [place]    OT Occupational Profile and History  Detailed Assessment- Review of Records  and additional review of physical, cognitive, psychosocial history related to current functional performance    Occupational performance deficits (Please refer to evaluation for details):  ADL's;IADL's     Body Structure / Function / Physical Skills  Mobility;ADL;Balance;Endurance;Strength;UE functional use;ROM;Dexterity;Pain;FMC;Coordination;Decreased knowledge of use of DME;IADL;Sensation    Cognitive Skills  Memory    Rehab Potential  Good    Clinical Decision Making  Several treatment options, min-mod task modification necessary    Comorbidities Affecting Occupational Performance:  May have comorbidities impacting occupational performance    Modification or Assistance to Complete Evaluation   Min-Moderate modification of tasks or assist with assess necessary to complete eval    OT Frequency  1x / week   due to financial concerns at pt/husband request   OT Duration  12 weeks   +eval   OT Treatment/Interventions  Self-care/ADL training;Therapeutic exercise;Functional Mobility Training;Manual Therapy;Splinting;Neuromuscular education;Ultrasound;Energy conservation;Therapeutic activities;Cognitive remediation/compensation;DME and/or AE instruction;Paraffin;Cryotherapy;Fluidtherapy;Patient/family education;Passive range of motion;Moist Heat    Plan  review theraband HEP, continue to address hand function/coordination, functional reach in standing    Consulted and Agree with Plan of Care  Patient;Family member/caregiver    Family Member Consulted  husband       Patient will benefit from skilled therapeutic intervention in order to improve the following deficits and impairments:   Body Structure / Function / Physical Skills: Mobility, ADL, Balance, Endurance, Strength, UE functional use, ROM, Dexterity, Pain, FMC, Coordination, Decreased knowledge of use of DME, IADL, Sensation Cognitive Skills: Memory     Visit Diagnosis: Hemiplegia and hemiparesis following cerebral infarction affecting right dominant side (HCC)  Muscle weakness (generalized)  Other lack of coordination  Other symptoms and signs involving cognitive functions following cerebral infarction  Other abnormalities of gait and  mobility  Unsteadiness on feet  Stiffness of left hand, not elsewhere classified  Stiffness of right hand, not elsewhere classified  Stiffness of left shoulder, not elsewhere classified    Problem List Patient Active Problem List   Diagnosis Date Noted  . Seizure prophylaxis--probable seizure 04/12/2018  . Sacral decubitus ulcer, stage IV ([place]) 04/12/2018  . Acute blood loss anemia   . Impulsive   . Hypokalemia   . ETOH abuse   . Dysphagia, post-stroke   . Benign essential HTN   . Acute deep vein thrombosis (DVT) of distal vein of both lower extremities (HCC)   . Sinus tachycardia   . Esophageal perforation   . Perforation esophagus   . Sepsis with acute renal failure ([place])   . Acute deep vein thrombosis (DVT) of both peroneal veins ([place])   . Leukocytosis   . Sepsis due to Pseudomonas species ([place])   . Cerebral embolism with cerebral infarction 03/09/2018  . Malnutrition of moderate degree 03/08/2018  . Empyema ([place]) 03/04/2018  . Pressure injury of skin 03/04/2018  . Acute respiratory failure with hypercapnia ([place]) 03/04/2018  . Diarrhea 02/26/2018  . Pancytopenia ([place]) 02/19/2018  . Hypotension 02/19/2018  . Severe protein-calorie malnutrition ([place]) 02/17/2018  . Colitis 02/16/2018  . Weakness 02/16/2018  . Nausea vomiting and diarrhea 02/16/2018  . Diabetes ([place]) 02/16/2018  . Essential hypertension 02/16/2018  . Anemia 02/16/2018    [facility] 03/02/2019, 2:41 PM  [facility] [address] [place], [place], [address] Phone: [phone]   Fax:  [phone]  Name: Maria Avila MRN: [id number] Date of Birth: 07/05/1950   Vianne Bulls, OTR/L [facility] [address] [place], [place]  [address] [phone] phone  [phone] 03/02/19 2:41 PM

## 2019-03-02 NOTE — Therapy (Signed)
[facility] [address] [place], [place], [address] Phone: [phone]   Fax:  [phone]  Physical Therapy Treatment  Patient Details  Name: Maria Avila MRN: [id number] Date of Birth: 04/29/1951 Referring Provider (PT): Andrew Kristeins, MD   Encounter Date: 03/02/2019  PT End of Session - 03/02/19 1238    Visit Number  9    Number of Visits  13    Date for PT Re-Evaluation  04/01/19    Authorization Type  UHC Medicare  $30 co-pay PT & OT ea. so $60 for both    Authorization Time Period  OOP $3600  $2190.16 met  VL:MN    PT Start Time  1235    PT Stop Time  1316    PT Time Calculation (min)  41 min    Equipment Utilized During Treatment  Gait belt    Activity Tolerance  Patient tolerated treatment well;Patient limited by fatigue    Behavior During Therapy  WFL for tasks assessed/performed;Flat affect       Past Medical History:  Diagnosis Date  . Colitis   . Diabetes mellitus without complication (HCC)   . Hypertension   . Malnutrition (HCC)   . Stroke (HCC)     Past Surgical History:  Procedure Laterality Date  . BIOPSY  02/26/2018   Procedure: BIOPSY;  Surgeon: Schooler, Vincent, MD;  Location: [facility];  Service: Endoscopy;;  . COLONOSCOPY WITH PROPOFOL N/A 02/26/2018   Procedure: COLONOSCOPY WITH PROPOFOL;  Surgeon: Schooler, Vincent, MD;  Location: [facility];  Service: Endoscopy;  Laterality: N/A;  . ESOPHAGOGASTRODUODENOSCOPY (EGD) WITH PROPOFOL N/A 02/26/2018   Procedure: ESOPHAGOGASTRODUODENOSCOPY (EGD) WITH PROPOFOL;  Surgeon: Schooler, Vincent, MD;  Location: [facility];  Service: Endoscopy;  Laterality: N/A;  . NO PAST SURGERIES     UNCERTAIN OF NAME OF SURGERY    Vitals:   03/02/19 1240  Pulse: 62  SpO2: 97%    Subjective Assessment - 03/02/19 1236    Subjective  Pt reports that she did go to the [employer] and did do leg press but did on 30#.    Patient is accompained by:   Family member   husband   Pertinent History  Bil CVAs, DM, HTN, Malnutrition    Patient Stated Goals  To walk without walker.    Currently in Pain?  No/denies    Pain Onset  More than a month ago                       [facility] Adult PT Treatment/Exercise - 03/02/19 1240      Ambulation/Gait   Ambulation/Gait  Yes    Ambulation/Gait Assistance  6: Modified independent (Device/Increase time);5: Supervision    Ambulation Distance (Feet)  230 Feet    Assistive device  Rolling walker    Gait Pattern  Step-through pattern    Ambulation Surface  Level;Unlevel;Indoor    Ramp  5: Supervision   x 2 with FWW   Curb  5: Supervision   x 2 with FWW   Gait Comments  HR=74 and O2=97% after gait. Pt ambulated another 115' without AD CGA with unsteadiness as went on with 1 episode of staggering requiring min assist to regain. Pt reported some soreness right posterior/lateral knee      Standardized Balance Assessment   Standardized Balance Assessment  Timed Up and Go Test      Timed Up and Go Test   TUG  Normal TUG      Normal TUG (seconds)  21.5   average of 2 trials with FWW, 20.5 sec without AD     Neuro Re-ed    Neuro Re-ed Details   Standing in // bars with sidestepping with bilateral UE x 3 laps, standing on blue foam without UE support x 1 min. Pt leaning posterior at times. Standing on blue airex with arm movements up/out/across chest x 10 CGA, Standing on rocker board maintaining level x 1 min.             PT Education - 03/02/19 2141    Education Details  Added clamshell to HEP    Person(s) Educated  Patient    Methods  Explanation;Demonstration;Handout    Comprehension  Verbalized understanding;Returned demonstration       PT Short Term Goals - 03/02/19 1238      PT SHORT TERM GOAL #1   Title  Timed Up & Go without device <15seconds with no balance losses.    Baseline  21.5 sec with FWW average, 20.5 sec average without AD on 03/02/19    Time  4    Period   Weeks    Status  On-going    Target Date  03/04/19      PT SHORT TERM GOAL #2   Title  Patient ambulates 200' outdoors on paved surfaces & negotiates ramps/curbs with RW with supervision.    Baseline  230' inside on level mod I with FWW, up/down ramp supervision, up/down curb supervision    Time  4    Period  Weeks    Status  Achieved    Target Date  03/04/19      PT SHORT TERM GOAL #3   Title  Patient ambulates 29' around obstacles without device with minA    Baseline  Pt ambulated 115' without AD CGA on level surfaces without obstacles    Time  4    Period  Weeks    Status  Partially Met    Target Date  03/04/19        PT Long Term Goals - 01/05/19 2215      PT LONG TERM GOAL #1   Title  Patient verbalizes & demonstrates understanding of ongoing HEP including community fitness. (All LTGs Target Date: 04/04/2019)    Time  12    Period  Weeks    Status  New    Target Date  04/04/19      PT LONG TERM GOAL #2   Title  Berg Balance score >36/56 to indicate lower fall risk.    Time  12    Period  Weeks    Status  New    Target Date  04/04/19      PT LONG TERM GOAL #3   Title  Timed Up & Go without device <13.5sec safely    Time  12    Period  Weeks    Status  New    Target Date  04/04/19      PT LONG TERM GOAL #4   Title  Patient ambulates community distances 150'  & negotiates ramps/ curbs with RW modified independent.    Time  12    Period  Weeks    Status  New    Target Date  04/04/19      PT LONG TERM GOAL #5   Title  Patient ambulates 100' around furniture without device modified independent.    Time  12    Period  Weeks  Status  New    Target Date  04/04/19            Plan - 03/02/19 2144    Clinical Impression Statement  Pt able to progress gait with FWW on ramps and curbs today meeting initial short term gait goal. Pt's TUG still showing fall risk potential and was about the same with and without walker. Pt less steady without walker and  reporting some discomfort in right posterior/lateral knee. Pt with genu valgus noted at knee with decreased hip abductor strength noted. Pt will benefit from continued PT to focus on strengthening at hips, balance training and gait training for improved safety with mobility.    Personal Factors and Comorbidities  Age;Comorbidity 3+;Fitness;Past/Current Experience;Time since onset of injury/illness/exacerbation    Comorbidities  Bil CVAs, DM, HTN, Malnutrition    Examination-Activity Limitations  Carry;Locomotion Level;Reach Overhead;Squat;Stairs;Stand;Transfers    Examination-Participation Restrictions  Community Activity    Stability/Clinical Decision Making  Evolving/Moderate complexity    Rehab Potential  Good    PT Frequency  1x / week    PT Duration  12 weeks    PT Treatment/Interventions  ADLs/Self Care Home Management;DME Instruction;Gait training;Stair training;Functional mobility training;Therapeutic activities;Therapeutic exercise;Balance training;Neuromuscular re-education;Patient/family education;Vestibular    PT Next Visit Plan  monitor HR & O2 sat, work towards LTGs, balance activities to facilitate ankle, hip & step strategies. Progress hip abductor strengthening. 10th visit progress note next visit    Consulted and Agree with Plan of Care  Patient       Patient will benefit from skilled therapeutic intervention in order to improve the following deficits and impairments:  Abnormal gait, Cardiopulmonary status limiting activity, Decreased activity tolerance, Decreased balance, Decreased endurance, Decreased knowledge of use of DME, Decreased mobility, Decreased range of motion, Decreased strength, Dizziness, Postural dysfunction, Pain  Visit Diagnosis: Muscle weakness (generalized)  Other abnormalities of gait and mobility     Problem List Patient Active Problem List   Diagnosis Date Noted  . Seizure prophylaxis--probable seizure 04/12/2018  . Sacral decubitus ulcer, stage  IV (HCC) 04/12/2018  . Acute blood loss anemia   . Impulsive   . Hypokalemia   . ETOH abuse   . Dysphagia, post-stroke   . Benign essential HTN   . Acute deep vein thrombosis (DVT) of distal vein of both lower extremities (HCC)   . Sinus tachycardia   . Esophageal perforation   . Perforation esophagus   . Sepsis with acute renal failure (HCC)   . Acute deep vein thrombosis (DVT) of both peroneal veins (HCC)   . Leukocytosis   . Sepsis due to Pseudomonas species (HCC)   . Cerebral embolism with cerebral infarction 03/09/2018  . Malnutrition of moderate degree 03/08/2018  . Empyema (HCC) 03/04/2018  . Pressure injury of skin 03/04/2018  . Acute respiratory failure with hypercapnia (HCC) 03/04/2018  . Diarrhea 02/26/2018  . Pancytopenia (HCC) 02/19/2018  . Hypotension 02/19/2018  . Severe protein-calorie malnutrition (HCC) 02/17/2018  . Colitis 02/16/2018  . Weakness 02/16/2018  . Nausea vomiting and diarrhea 02/16/2018  . Diabetes (HCC) 02/16/2018  . Essential hypertension 02/16/2018  . Anemia 02/16/2018     A , PT, DPT, NCS 03/02/2019, 9:50 PM  [facility] [address] [place], [place], [address] Phone: [phone]   Fax:  [phone]  Name: Maria Avila MRN: [id number] Date of Birth: 07/03/1950   

## 2019-03-15 ENCOUNTER — Ambulatory Visit: Payer: Medicare Other | Attending: Physical Medicine & Rehabilitation | Admitting: Occupational Therapy

## 2019-03-15 ENCOUNTER — Other Ambulatory Visit: Payer: Self-pay

## 2019-03-15 DIAGNOSIS — M25642 Stiffness of left hand, not elsewhere classified: Secondary | ICD-10-CM

## 2019-03-15 DIAGNOSIS — R2681 Unsteadiness on feet: Secondary | ICD-10-CM | POA: Diagnosis not present

## 2019-03-15 DIAGNOSIS — R2689 Other abnormalities of gait and mobility: Secondary | ICD-10-CM

## 2019-03-15 DIAGNOSIS — M25612 Stiffness of left shoulder, not elsewhere classified: Secondary | ICD-10-CM | POA: Diagnosis not present

## 2019-03-15 DIAGNOSIS — M6281 Muscle weakness (generalized): Secondary | ICD-10-CM

## 2019-03-15 DIAGNOSIS — I69318 Other symptoms and signs involving cognitive functions following cerebral infarction: Secondary | ICD-10-CM

## 2019-03-15 DIAGNOSIS — I69351 Hemiplegia and hemiparesis following cerebral infarction affecting right dominant side: Secondary | ICD-10-CM | POA: Diagnosis not present

## 2019-03-15 DIAGNOSIS — M25641 Stiffness of right hand, not elsewhere classified: Secondary | ICD-10-CM | POA: Diagnosis not present

## 2019-03-15 DIAGNOSIS — R278 Other lack of coordination: Secondary | ICD-10-CM | POA: Diagnosis not present

## 2019-03-15 NOTE — Therapy (Signed)
[facility] [address] [place], [place], [address] Phone: [phone]   Fax:  [phone]  Occupational Therapy Treatment  Patient Details  Name: Maria Avila MRN: [id number] Date of Birth: 1950-06-20 No data recorded  Encounter Date: 03/15/2019  OT End of Session - 03/15/19 1321    Visit Number  9    Number of Visits  13    Date for OT Re-Evaluation  04/05/19    Authorization Type  [employer] Medicare, $30 copay each OT/PT (seperate)    Authorization - Visit Number  9    Authorization - Number of Visits  10    OT Start Time  1320    OT Stop Time  1400    OT Time Calculation (min)  40 min    Activity Tolerance  Patient tolerated treatment well    Behavior During Therapy  [facility] for tasks assessed/performed       Past Medical History:  Diagnosis Date  . Colitis   . Diabetes mellitus without complication ([place])   . Hypertension   . Malnutrition ([place])   . Stroke [facility])     Past Surgical History:  Procedure Laterality Date  . BIOPSY  02/26/2018   Procedure: BIOPSY;  Surgeon: Wilford Corner, MD;  Location: [facility];  Service: Endoscopy;;  . COLONOSCOPY WITH PROPOFOL N/A 02/26/2018   Procedure: COLONOSCOPY WITH PROPOFOL;  Surgeon: Wilford Corner, MD;  Location: [facility];  Service: Endoscopy;  Laterality: N/A;  . ESOPHAGOGASTRODUODENOSCOPY (EGD) WITH PROPOFOL N/A 02/26/2018   Procedure: ESOPHAGOGASTRODUODENOSCOPY (EGD) WITH PROPOFOL;  Surgeon: Wilford Corner, MD;  Location: [facility];  Service: Endoscopy;  Laterality: N/A;  . NO PAST SURGERIES     UNCERTAIN OF NAME OF SURGERY    There were no vitals filed for this visit.  Subjective Assessment - 03/15/19 1559    Subjective   Went to the [facility] yesterday (right now aiming for 2x week).  Pt reports that she is doing more dusting.    Pertinent History  bilateral infarcts fall 2019.    PMH:  hx of questionable seizures, malnutrition, hypertension,  diabetes, colitis, hx of heavy alcohol use, DM, HTN, hx of empyema/respiratory failure and sepsis 10/19    Limitations  fall risk, questionable seizures    Patient Stated Goals  improve ability to use hands, open bottle, sweep    Currently in Pain?  No/denies    Pain Onset  More than a month ago             Treatment: Yellow theraband exercises for shoulder flexion, extension horizontal abduction, biceps cures and triceps extension, 10-15 reps each min-mod v.c for positioning, placing grooved pegs into pegboard with LUE min-,mod difficulty and v.c to avoid compensation Arm bike level 3 x 6 mins for conditioning.                OT Short Term Goals - 03/15/19 1322      OT SHORT TERM GOAL #1   Title  Pt will be independent with intitial HEP.--check STGs 02/23/19    Time  6    Period  Weeks    Status  Achieved      OT SHORT TERM GOAL #2   Title  Pt will demo at least 120* L shoulder flex for functional reaching without pain.    Baseline  110* scaption    Time  6    Period  Weeks    Status  On-going   02/09/19  110*  scaption with no pain.  03/02/19:  110-115*     OT SHORT TERM GOAL #3   Title  Pt will demo at least 75% L gross finger flex for improved grasp.    Baseline  50%    Time  6    Period  Weeks    Status  Achieved   02/09/19:  met     OT SHORT TERM GOAL #4   Title  Pt will be able to sweep with supervision.    Time  6    Period  Weeks    Status  Partially Met   02/06/19:  met in clinic       OT Long Term Goals - 03/15/19 1322      OT LONG TERM GOAL #1   Title  Pt will verbalize understanding of appropriate community fitness activities and updated HEP.--check LTGs 04/05/19    Time  12    Period  Weeks    Status  On-going      OT LONG TERM GOAL #2   Title  Pt will demo at least 125* L shoulder flex without pain for functional reaching.    Time  12    Period  Weeks    Status  New      OT LONG TERM GOAL #3   Title  Pt will improve bilateral  hand coordination for ADLs as shown by decr time on 9-hole peg test by at least 5sec.    Baseline  R-35.63, L-40.90sec    Time  12    Period  Weeks      OT LONG TERM GOAL #4   Title  Pt will improve L grip strength by at least 8lbs to assist with opening containers/bottles.    Baseline  R-25.5, L-9.9lbs    Time  12    Period  Weeks    Status  New      OT LONG TERM GOAL #5   Title  Pt will be able to carry plate/cup at least 15 feet without LOB or spills.    Time  12    Period  Weeks    Status  New      OT LONG TERM GOAL #6   Title  Pt will be able to sweep mod I.    Time  12    Period  Weeks    Status  New            Plan - 03/15/19 1321    Clinical Impression Statement  Pt is progressing towards goals with improving strength and coordination.    OT Occupational Profile and History  Detailed Assessment- Review of Records and additional review of physical, cognitive, psychosocial history related to current functional performance    Occupational performance deficits (Please refer to evaluation for details):  ADL's;IADL's    Body Structure / Function / Physical Skills  Mobility;ADL;Balance;Endurance;Strength;UE functional use;ROM;Dexterity;Pain;FMC;Coordination;Decreased knowledge of use of DME;IADL;Sensation    Cognitive Skills  Memory    Rehab Potential  Good    Clinical Decision Making  Several treatment options, min-mod task modification necessary    Comorbidities Affecting Occupational Performance:  May have comorbidities impacting occupational performance    Modification or Assistance to Complete Evaluation   Min-Moderate modification of tasks or assist with assess necessary to complete eval    OT Frequency  1x / week   due to financial concerns at pt/husband request   OT Duration  12 weeks   +eval   OT Treatment/Interventions  Self-care/ADL training;Therapeutic exercise;Functional Mobility Training;Manual Therapy;Splinting;Neuromuscular education;Ultrasound;Energy  conservation;Therapeutic activities;Cognitive remediation/compensation;DME and/or AE instruction;Paraffin;Cryotherapy;Fluidtherapy;Patient/family education;Passive range of motion;Moist Heat    Plan  continue to address hand function/coordination, functional reach in standing    Consulted and Agree with Plan of Care  Patient;Family member/caregiver    Family Member Consulted  husband       Patient will benefit from skilled therapeutic intervention in order to improve the following deficits and impairments:   Body Structure / Function / Physical Skills: Mobility, ADL, Balance, Endurance, Strength, UE functional use, ROM, Dexterity, Pain, FMC, Coordination, Decreased knowledge of use of DME, IADL, Sensation Cognitive Skills: Memory     Visit Diagnosis: Muscle weakness (generalized)  Other abnormalities of gait and mobility  Hemiplegia and hemiparesis following cerebral infarction affecting right dominant side (HCC)  Other lack of coordination  Other symptoms and signs involving cognitive functions following cerebral infarction  Stiffness of left hand, not elsewhere classified    Problem List Patient Active Problem List   Diagnosis Date Noted  . Seizure prophylaxis--probable seizure 04/12/2018  . Sacral decubitus ulcer, stage IV ([place]) 04/12/2018  . Acute blood loss anemia   . Impulsive   . Hypokalemia   . ETOH abuse   . Dysphagia, post-stroke   . Benign essential HTN   . Acute deep vein thrombosis (DVT) of distal vein of both lower extremities (HCC)   . Sinus tachycardia   . Esophageal perforation   . Perforation esophagus   . Sepsis with acute renal failure ([place])   . Acute deep vein thrombosis (DVT) of both peroneal veins ([place])   . Leukocytosis   . Sepsis due to Pseudomonas species ([place])   . Cerebral embolism with cerebral infarction 03/09/2018  . Malnutrition of moderate degree 03/08/2018  . Empyema ([place]) 03/04/2018  . Pressure injury of skin 03/04/2018  . Acute  respiratory failure with hypercapnia ([place]) 03/04/2018  . Diarrhea 02/26/2018  . Pancytopenia ([place]) 02/19/2018  . Hypotension 02/19/2018  . Severe protein-calorie malnutrition ([place]) 02/17/2018  . Colitis 02/16/2018  . Weakness 02/16/2018  . Nausea vomiting and diarrhea 02/16/2018  . Diabetes ([place]) 02/16/2018  . Essential hypertension 02/16/2018  . Anemia 02/16/2018    , 03/15/2019, 4:01 PM  [facility] [address] [place], [place], [address] Phone: [phone]   Fax:  [phone]  Name: Renessa E Avila MRN: [id number] Date of Birth: [birth year]

## 2019-03-16 ENCOUNTER — Ambulatory Visit: Payer: Medicare Other

## 2019-03-16 ENCOUNTER — Ambulatory Visit: Payer: Medicare Other | Admitting: Occupational Therapy

## 2019-03-16 DIAGNOSIS — M25642 Stiffness of left hand, not elsewhere classified: Secondary | ICD-10-CM | POA: Diagnosis not present

## 2019-03-16 DIAGNOSIS — R2689 Other abnormalities of gait and mobility: Secondary | ICD-10-CM

## 2019-03-16 DIAGNOSIS — I69351 Hemiplegia and hemiparesis following cerebral infarction affecting right dominant side: Secondary | ICD-10-CM | POA: Diagnosis not present

## 2019-03-16 DIAGNOSIS — M6281 Muscle weakness (generalized): Secondary | ICD-10-CM

## 2019-03-16 DIAGNOSIS — M25641 Stiffness of right hand, not elsewhere classified: Secondary | ICD-10-CM | POA: Diagnosis not present

## 2019-03-16 DIAGNOSIS — R2681 Unsteadiness on feet: Secondary | ICD-10-CM | POA: Diagnosis not present

## 2019-03-16 DIAGNOSIS — R278 Other lack of coordination: Secondary | ICD-10-CM | POA: Diagnosis not present

## 2019-03-16 DIAGNOSIS — I69318 Other symptoms and signs involving cognitive functions following cerebral infarction: Secondary | ICD-10-CM | POA: Diagnosis not present

## 2019-03-16 DIAGNOSIS — M25612 Stiffness of left shoulder, not elsewhere classified: Secondary | ICD-10-CM | POA: Diagnosis not present

## 2019-03-16 NOTE — Therapy (Signed)
[facility] [address] [place], [place], [address] Phone: [phone]   Fax:  [phone]  Physical Therapy Treatment/10th visit progress note  Patient Details  Name: Maria Avila MRN: [id number] Date of Birth: 09/06/1950 Referring Provider (PT): Jacobo Forest, MD    Progress Note  Reporting period 01/05/2019 to 03/16/2019  See Note below for Objective Data and Assessment of Progress/Goals  Encounter Date: 03/16/2019  PT End of Session - 03/16/19 1321    Visit Number  10    Number of Visits  13    Date for PT Re-Evaluation  04/01/19    Authorization Type  [employer] Medicare  $30 co-pay PT & OT ea. so $60 for both    Authorization Time Period  OOP $3600  $2190.16 met  VL:MN    PT Start Time  1320    PT Stop Time  1358    PT Time Calculation (min)  38 min    Equipment Utilized During Treatment  Gait belt    Activity Tolerance  Patient tolerated treatment well;Patient limited by fatigue    Behavior During Therapy  WFL for tasks assessed/performed;Flat affect       Past Medical History:  Diagnosis Date  . Colitis   . Diabetes mellitus without complication ([place])   . Hypertension   . Malnutrition ([place])   . Stroke [facility])     Past Surgical History:  Procedure Laterality Date  . BIOPSY  02/26/2018   Procedure: BIOPSY;  Surgeon: Wilford Corner, MD;  Location: [facility];  Service: Endoscopy;;  . COLONOSCOPY WITH PROPOFOL N/A 02/26/2018   Procedure: COLONOSCOPY WITH PROPOFOL;  Surgeon: Wilford Corner, MD;  Location: [facility];  Service: Endoscopy;  Laterality: N/A;  . ESOPHAGOGASTRODUODENOSCOPY (EGD) WITH PROPOFOL N/A 02/26/2018   Procedure: ESOPHAGOGASTRODUODENOSCOPY (EGD) WITH PROPOFOL;  Surgeon: Wilford Corner, MD;  Location: [facility];  Service: Endoscopy;  Laterality: N/A;  . NO PAST SURGERIES     UNCERTAIN OF NAME OF SURGERY    There were no vitals filed for this visit.  Subjective  Assessment - 03/16/19 1322    Subjective  Pt reports that she is doing well.    Patient is accompained by:  Family member   husband   Pertinent History  Bil CVAs, DM, HTN, Malnutrition    Patient Stated Goals  To walk without walker.    Currently in Pain?  No/denies    Pain Onset  More than a month ago                       [facility] Adult PT Treatment/Exercise - 03/16/19 1323      Ambulation/Gait   Ambulation/Gait  Yes    Ambulation/Gait Assistance  5: Supervision;4: Min guard    Ambulation Distance (Feet)  230 Feet    Assistive device  None    Gait Pattern  Step-through pattern;Decreased step length - right;Decreased step length - left;Decreased hip/knee flexion - right;Decreased hip/knee flexion - left;Decreased trunk rotation    Ambulation Surface  Level;Indoor    Gait Comments  O2 sat=95% after gait and HR=64      Standardized Balance Assessment   Standardized Balance Assessment  Timed Up and Go Test      Timed Up and Go Test   TUG  Normal TUG    Normal TUG (seconds)  19.84   with walker, without walker=17.17 sec     Neuro Re-ed    Neuro Re-ed Details   Marching  gait in // bars with 1 UE support x 2 laps then without UE support x 4 laps. Side stepping along // bars without UE support x 4 laps. Standing on airex feet apart without UE support 30 sec x 2 then with reaching x 30 sec CGA. Standing on airex attempted eyes closed but patient lost balance posterior quickly requiring mod assist to regain. Standing on airex with eyes open with head movements left/right and up/down x 10 each. Pt lost balance posterior with looking up at times requiring min assist to regain.                PT Short Term Goals - 03/16/19 1432      PT SHORT TERM GOAL #1   Title  Timed Up & Go without device <15seconds with no balance losses.    Baseline  19.84 sec with FWW and 17.17 without AD on 03/16/19    Time  4    Period  Weeks    Status  On-going    Target Date  03/04/19       PT SHORT TERM GOAL #2   Title  Patient ambulates 200' outdoors on paved surfaces & negotiates ramps/curbs with RW with supervision.    Baseline  230' inside on level mod I with FWW, up/down ramp supervision, up/down curb supervision    Time  4    Period  Weeks    Status  Achieved    Target Date  03/04/19      PT SHORT TERM GOAL #3   Title  Patient ambulates 19' around obstacles without device with minA    Baseline  Pt ambulated 230' without AD CGA on level surfaces without obstacles    Time  4    Period  Weeks    Status  Partially Met    Target Date  03/04/19        PT Long Term Goals - 01/05/19 2215      PT LONG TERM GOAL #1   Title  Patient verbalizes & demonstrates understanding of ongoing HEP including community fitness. (All LTGs Target Date: 04/04/2019)    Time  12    Period  Weeks    Status  New    Target Date  04/04/19      PT LONG TERM GOAL #2   Title  Berg Balance score >36/56 to indicate lower fall risk.    Time  12    Period  Weeks    Status  New    Target Date  04/04/19      PT LONG TERM GOAL #3   Title  Timed Up & Go without device <13.5sec safely    Time  12    Period  Weeks    Status  New    Target Date  04/04/19      PT LONG TERM GOAL #4   Title  Patient ambulates community distances 150'  & negotiates ramps/ curbs with RW modified independent.    Time  12    Period  Weeks    Status  New    Target Date  04/04/19      PT LONG TERM GOAL #5   Title  Patient ambulates 100' around furniture without device modified independent.    Time  12    Period  Weeks    Status  New    Target Date  04/04/19            Plan - 03/16/19 1434  Clinical Impression Statement  Pt was able to increase gait distance without AD today but continues to have slow cadence and requires CGA for safety. Pt improved more on TUG with decreasing time but still short of goal. Pt remains challenged by balance activities with narrow BOS and dynamic activities. Poor use of  vestibular system for balance noted today. Will benefit from continued PT to continue to progress balance, gait and strength for improved independence in home and community.    Personal Factors and Comorbidities  Age;Comorbidity 3+;Fitness;Past/Current Experience;Time since onset of injury/illness/exacerbation    Comorbidities  Bil CVAs, DM, HTN, Malnutrition    Examination-Activity Limitations  Carry;Locomotion Level;Reach Overhead;Squat;Stairs;Stand;Transfers    Examination-Participation Restrictions  Community Activity    Stability/Clinical Decision Making  Evolving/Moderate complexity    Rehab Potential  Good    PT Frequency  1x / week    PT Duration  12 weeks    PT Treatment/Interventions  ADLs/Self Care Home Management;DME Instruction;Gait training;Stair training;Functional mobility training;Therapeutic activities;Therapeutic exercise;Balance training;Neuromuscular re-education;Patient/family education;Vestibular    PT Next Visit Plan  monitor HR & O2 sat, work towards LTGs, balance activities to facilitate ankle, hip & step strategies. Progress hip abductor strengthening. Schedule out further as will plan to recert    Consulted and Agree with Plan of Care  Patient       Patient will benefit from skilled therapeutic intervention in order to improve the following deficits and impairments:  Abnormal gait, Cardiopulmonary status limiting activity, Decreased activity tolerance, Decreased balance, Decreased endurance, Decreased knowledge of use of DME, Decreased mobility, Decreased range of motion, Decreased strength, Dizziness, Postural dysfunction, Pain  Visit Diagnosis: Muscle weakness (generalized)  Other abnormalities of gait and mobility     Problem List Patient Active Problem List   Diagnosis Date Noted  . Seizure prophylaxis--probable seizure 04/12/2018  . Sacral decubitus ulcer, stage IV ([place]) 04/12/2018  . Acute blood loss anemia   . Impulsive   . Hypokalemia   . ETOH  abuse   . Dysphagia, post-stroke   . Benign essential HTN   . Acute deep vein thrombosis (DVT) of distal vein of both lower extremities (HCC)   . Sinus tachycardia   . Esophageal perforation   . Perforation esophagus   . Sepsis with acute renal failure ([place])   . Acute deep vein thrombosis (DVT) of both peroneal veins ([place])   . Leukocytosis   . Sepsis due to Pseudomonas species ([place])   . Cerebral embolism with cerebral infarction 03/09/2018  . Malnutrition of moderate degree 03/08/2018  . Empyema ([place]) 03/04/2018  . Pressure injury of skin 03/04/2018  . Acute respiratory failure with hypercapnia ([place]) 03/04/2018  . Diarrhea 02/26/2018  . Pancytopenia ([place]) 02/19/2018  . Hypotension 02/19/2018  . Severe protein-calorie malnutrition ([place]) 02/17/2018  . Colitis 02/16/2018  . Weakness 02/16/2018  . Nausea vomiting and diarrhea 02/16/2018  . Diabetes ([place]) 02/16/2018  . Essential hypertension 02/16/2018  . Anemia 02/16/2018    Electa Sniff, PT, DPT, NCS 03/16/2019, 2:37 PM  [facility] [address] [place], [place], [address] Phone: [phone]   Fax:  [phone]  Name: Maria Avila MRN: [id number] Date of Birth: 1950-10-23

## 2019-03-20 DIAGNOSIS — I639 Cerebral infarction, unspecified: Secondary | ICD-10-CM | POA: Diagnosis not present

## 2019-03-20 DIAGNOSIS — L89154 Pressure ulcer of sacral region, stage 4: Secondary | ICD-10-CM | POA: Diagnosis not present

## 2019-03-23 ENCOUNTER — Ambulatory Visit: Payer: Medicare Other

## 2019-03-23 ENCOUNTER — Ambulatory Visit: Payer: Medicare Other | Admitting: Occupational Therapy

## 2019-03-23 ENCOUNTER — Other Ambulatory Visit: Payer: Self-pay

## 2019-03-23 DIAGNOSIS — M6281 Muscle weakness (generalized): Secondary | ICD-10-CM

## 2019-03-23 DIAGNOSIS — M25641 Stiffness of right hand, not elsewhere classified: Secondary | ICD-10-CM | POA: Diagnosis not present

## 2019-03-23 DIAGNOSIS — M25612 Stiffness of left shoulder, not elsewhere classified: Secondary | ICD-10-CM | POA: Diagnosis not present

## 2019-03-23 DIAGNOSIS — I69351 Hemiplegia and hemiparesis following cerebral infarction affecting right dominant side: Secondary | ICD-10-CM

## 2019-03-23 DIAGNOSIS — M25642 Stiffness of left hand, not elsewhere classified: Secondary | ICD-10-CM | POA: Diagnosis not present

## 2019-03-23 DIAGNOSIS — R278 Other lack of coordination: Secondary | ICD-10-CM | POA: Diagnosis not present

## 2019-03-23 DIAGNOSIS — R2689 Other abnormalities of gait and mobility: Secondary | ICD-10-CM

## 2019-03-23 DIAGNOSIS — I69318 Other symptoms and signs involving cognitive functions following cerebral infarction: Secondary | ICD-10-CM | POA: Diagnosis not present

## 2019-03-23 DIAGNOSIS — R2681 Unsteadiness on feet: Secondary | ICD-10-CM | POA: Diagnosis not present

## 2019-03-23 NOTE — Therapy (Signed)
[facility] [address] [place], [place], [address] Phone: [phone]   Fax:  [phone]  Physical Therapy Treatment  Patient Details  Name: Maria Avila MRN: [id number] Date of Birth: 01-Oct-1950 Referring Provider (PT): Jacobo Forest, MD   Encounter Date: 03/23/2019  PT End of Session - 03/23/19 1317    Visit Number  11    Number of Visits  13    Date for PT Re-Evaluation  04/01/19    Authorization Type  UHC Medicare  $30 co-pay PT & OT ea. so $60 for both    Authorization Time Period  OOP $3600  $2190.16 met  VL:MN    PT Start Time  1316    PT Stop Time  1403    PT Time Calculation (min)  47 min    Equipment Utilized During Treatment  Gait belt    Activity Tolerance  Patient tolerated treatment well;Patient limited by fatigue    Behavior During Therapy  [facility] for tasks assessed/performed;Flat affect       Past Medical History:  Diagnosis Date  . Colitis   . Diabetes mellitus without complication ([place])   . Hypertension   . Malnutrition ([place])   . Stroke [facility])     Past Surgical History:  Procedure Laterality Date  . BIOPSY  02/26/2018   Procedure: BIOPSY;  Surgeon: Wilford Corner, MD;  Location: [place];  Service: Endoscopy;;  . COLONOSCOPY WITH PROPOFOL N/A 02/26/2018   Procedure: COLONOSCOPY WITH PROPOFOL;  Surgeon: Wilford Corner, MD;  Location: [place];  Service: Endoscopy;  Laterality: N/A;  . ESOPHAGOGASTRODUODENOSCOPY (EGD) WITH PROPOFOL N/A 02/26/2018   Procedure: ESOPHAGOGASTRODUODENOSCOPY (EGD) WITH PROPOFOL;  Surgeon: Wilford Corner, MD;  Location: [place];  Service: Endoscopy;  Laterality: N/A;  . NO PAST SURGERIES     UNCERTAIN OF NAME OF SURGERY    There were no vitals filed for this visit.  Subjective Assessment - 03/23/19 1318    Subjective  Pt just finished with OT. Reports she is doing well.    Patient is accompained by:  Family member   husband   Pertinent History  Bil CVAs, DM, HTN, Malnutrition    Patient Stated Goals  To walk without walker.    Currently in Pain?  No/denies    Pain Onset  More than a month ago                       [facility] Adult PT Treatment/Exercise - 03/23/19 1322      Ambulation/Gait   Ambulation/Gait  Yes    Ambulation/Gait Assistance  4: Min guard;5: Supervision    Ambulation Distance (Feet)  200 Feet    Assistive device  None    Gait Pattern  Step-through pattern    Ramp  5: Supervision   with FWW. Performed x 2   Ramp Details (indicate cue type and reason)  cues to lean forward with ascent and take time with descent    Curb  5: Supervision    Curb Details (indicate cue type and reason)  with walker with cues for technique    Gait Comments  Weaving in and out of 4 cones x 3 times CGA. 1 episode of LOB when distracted requiring min assist.      Standardized Balance Assessment   Standardized Balance Assessment  Berg Balance Test      Berg Balance Test   Sit to Stand  Able to stand without using hands and stabilize  independently    Standing Unsupported  Able to stand safely 2 minutes    Sitting with Back Unsupported but Feet Supported on Floor or Stool  Able to sit safely and securely 2 minutes    Stand to Sit  Sits safely with minimal use of hands    Transfers  Able to transfer safely, minor use of hands    Standing Unsupported with Eyes Closed  Able to stand 10 seconds with supervision    Standing Ubsupported with Feet Together  Able to place feet together independently and stand for 1 minute with supervision    From Standing, Reach Forward with Outstretched Arm  Can reach forward >12 cm safely (5")    From Standing Position, Pick up Object from Floor  Able to pick up shoe, needs supervision    From Standing Position, Turn to Look Behind Over each Shoulder  Turn sideways only but maintains balance    Turn 360 Degrees  Able to turn 360 degrees safely but slowly    Standing Unsupported,  Alternately Place Feet on Step/Stool  Able to complete >2 steps/needs minimal assist    Standing Unsupported, One Foot in Front  Able to take small step independently and hold 30 seconds    Standing on One Leg  Unable to try or needs assist to prevent fall    Total Score  39      Neuro Re-ed    Neuro Re-ed Details   Marching in // bars with 1 UE support x 2 laps, side stepping without UE support x 2 laps. Along counter side stepping 6' x 2 and marching with 1 UE support 6' x 2 to simulate home environment. Standing in corner with feet together x 30 sec eyes closed then with head turns left/right x 10 supervision. Pt had increased sway with head turns.             PT Education - 03/23/19 1421    Education Details  Added balance exercises to HEP    Person(s) Educated  Patient    Methods  Explanation;Demonstration;Handout    Comprehension  Verbalized understanding;Returned demonstration       PT Short Term Goals - 03/23/19 1332      PT SHORT TERM GOAL #1   Title  Timed Up & Go without device <15seconds with no balance losses.    Baseline  19.84 sec with FWW and 17.17 without AD on 03/16/19    Time  4    Period  Weeks    Status  On-going    Target Date  03/04/19      PT SHORT TERM GOAL #2   Title  Patient ambulates 200' outdoors on paved surfaces & negotiates ramps/curbs with RW with supervision.    Baseline  230' inside on level mod I with FWW, up/down ramp supervision, up/down curb supervision    Time  4    Period  Weeks    Status  Achieved    Target Date  03/04/19      PT SHORT TERM GOAL #3   Title  Patient ambulates 72' around obstacles without device with minA    Baseline  Pt ambulated 200' without device with CGA/ min assist for one slight LOB to the left.    Time  4    Period  Weeks    Status  Achieved    Target Date  03/04/19        PT Long Term Goals - 03/23/19 1320  PT LONG TERM GOAL #1   Title  Patient verbalizes & demonstrates understanding of  ongoing HEP including community fitness. (All LTGs Target Date: 04/04/2019)    Time  12    Period  Weeks    Status  New      PT LONG TERM GOAL #2   Title  Berg Balance score >36/56 to indicate lower fall risk.    Baseline  39/56 on 03/23/2019    Time  12    Period  Weeks    Status  On-going      PT LONG TERM GOAL #3   Title  Timed Up & Go without device <13.5sec safely    Time  12    Period  Weeks    Status  New      PT LONG TERM GOAL #4   Title  Patient ambulates community distances 150'  & negotiates ramps/ curbs with RW modified independent.    Time  12    Period  Weeks    Status  New      PT LONG TERM GOAL #5   Title  Patient ambulates 100' around furniture without device modified independent.    Time  12    Period  Weeks    Status  New            Plan - 03/23/19 1421    Clinical Impression Statement  Pt improved Berg Balance score 14 points from initial eval. Pt still in high risk fall group and will benefit from continued work on balance. Met STG for gait without AD CGA/min assist level. Pt continues to benefit from skilled PT to address deficits.    Personal Factors and Comorbidities  Age;Comorbidity 3+;Fitness;Past/Current Experience;Time since onset of injury/illness/exacerbation    Comorbidities  Bil CVAs, DM, HTN, Malnutrition    Examination-Activity Limitations  Carry;Locomotion Level;Reach Overhead;Squat;Stairs;Stand;Transfers    Examination-Participation Restrictions  Community Activity    Stability/Clinical Decision Making  Evolving/Moderate complexity    Rehab Potential  Good    PT Frequency  1x / week    PT Duration  12 weeks    PT Treatment/Interventions  ADLs/Self Care Home Management;DME Instruction;Gait training;Stair training;Functional mobility training;Therapeutic activities;Therapeutic exercise;Balance training;Neuromuscular re-education;Patient/family education;Vestibular    PT Next Visit Plan  See what patient and husband decided about  extending visits. Recert if in agreement. Check remaining LTG.    Consulted and Agree with Plan of Care  Patient;Family member/caregiver    Family Member Consulted  Pt spoke with husband at end of session about recommendation to continue therapy and they will think about it.       Patient will benefit from skilled therapeutic intervention in order to improve the following deficits and impairments:  Abnormal gait, Cardiopulmonary status limiting activity, Decreased activity tolerance, Decreased balance, Decreased endurance, Decreased knowledge of use of DME, Decreased mobility, Decreased range of motion, Decreased strength, Dizziness, Postural dysfunction, Pain  Visit Diagnosis: Muscle weakness (generalized)  Other abnormalities of gait and mobility     Problem List Patient Active Problem List   Diagnosis Date Noted  . Seizure prophylaxis--probable seizure 04/12/2018  . Sacral decubitus ulcer, stage IV ([place]) 04/12/2018  . Acute blood loss anemia   . Impulsive   . Hypokalemia   . ETOH abuse   . Dysphagia, post-stroke   . Benign essential HTN   . Acute deep vein thrombosis (DVT) of distal vein of both lower extremities (HCC)   . Sinus tachycardia   . Esophageal perforation   .  Perforation esophagus   . Sepsis with acute renal failure ([place])   . Acute deep vein thrombosis (DVT) of both peroneal veins ([place])   . Leukocytosis   . Sepsis due to Pseudomonas species ([place])   . Cerebral embolism with cerebral infarction 03/09/2018  . Malnutrition of moderate degree 03/08/2018  . Empyema ([place]) 03/04/2018  . Pressure injury of skin 03/04/2018  . Acute respiratory failure with hypercapnia ([place]) 03/04/2018  . Diarrhea 02/26/2018  . Pancytopenia ([place]) 02/19/2018  . Hypotension 02/19/2018  . Severe protein-calorie malnutrition ([place]) 02/17/2018  . Colitis 02/16/2018  . Weakness 02/16/2018  . Nausea vomiting and diarrhea 02/16/2018  . Diabetes ([place]) 02/16/2018  . Essential hypertension  02/16/2018  . Anemia 02/16/2018    Electa Sniff, PT, DPT, NCS 03/23/2019, 2:24 PM  [facility] [address] [place], [place], [address] Phone: [phone]   Fax:  [phone]  Name: Maria Avila MRN: [id number] Date of Birth: October 24, 1950

## 2019-03-23 NOTE — Therapy (Signed)
[facility] [address] [place], [place], [address] Phone: [phone]   Fax:  [phone]  Occupational Therapy Treatment  Patient Details  Name: Maria Avila MRN: [id number] Date of Birth: July 07, 1950 No data recorded  Encounter Date: 03/23/2019  OT End of Session - 03/23/19 1235    Visit Number  10    Number of Visits  13    Date for OT Re-Evaluation  04/05/19    Authorization Type  [employer] Medicare, $30 copay each OT/PT (seperate)    Authorization - Visit Number  10    Authorization - Number of Visits  10    OT Start Time  1234    OT Stop Time  1313    OT Time Calculation (min)  39 min    Activity Tolerance  Patient tolerated treatment well    Behavior During Therapy  [facility] for tasks assessed/performed       Past Medical History:  Diagnosis Date  . Colitis   . Diabetes mellitus without complication ([place])   . Hypertension   . Malnutrition ([place])   . Stroke [facility])     Past Surgical History:  Procedure Laterality Date  . BIOPSY  02/26/2018   Procedure: BIOPSY;  Surgeon: Wilford Corner, MD;  Location: [place];  Service: Endoscopy;;  . COLONOSCOPY WITH PROPOFOL N/A 02/26/2018   Procedure: COLONOSCOPY WITH PROPOFOL;  Surgeon: Wilford Corner, MD;  Location: [place];  Service: Endoscopy;  Laterality: N/A;  . ESOPHAGOGASTRODUODENOSCOPY (EGD) WITH PROPOFOL N/A 02/26/2018   Procedure: ESOPHAGOGASTRODUODENOSCOPY (EGD) WITH PROPOFOL;  Surgeon: Wilford Corner, MD;  Location: [place];  Service: Endoscopy;  Laterality: N/A;  . NO PAST SURGERIES     UNCERTAIN OF NAME OF SURGERY    There were no vitals filed for this visit.  Subjective Assessment - 03/23/19 1312    Subjective   Denies pain    Pertinent History  bilateral infarcts fall 2019.    PMH:  hx of questionable seizures, malnutrition, hypertension, diabetes, colitis, hx of heavy alcohol use, DM, HTN, hx of empyema/respiratory failure and  sepsis 10/19    Limitations  fall risk, questionable seizures    Patient Stated Goals  improve ability to use hands, open bottle, sweep    Currently in Pain?  No/denies    Pain Onset  More than a month ago            Treatment: Therapist checked progress towards goals  in prep for 10th visit progress note. See goals for updates. Standing to copy small peg design and midrange shoulder flexion with left and right UE's, min difficulty/ v.c, increased time required Gripper set at level 2 to pick up 1 inch blocks, for sustained grip,  min difficulty/ drops Standing to perform functional reaching and sustained pinch to place graded clothespins on vertical antennae.                 OT Short Term Goals - 03/23/19 1238      OT SHORT TERM GOAL #1   Title  Pt will be independent with intitial HEP.--check STGs 02/23/19    Time  6    Period  Weeks    Status  Achieved      OT SHORT TERM GOAL #2   Title  Pt will demo at least 120* L shoulder flex for functional reaching without pain.    Baseline  110* scaption    Time  6    Period  Weeks  Status  Achieved   120 with min compensations and no pain-03/23/2019     OT SHORT TERM GOAL #3   Title  Pt will demo at least 75% L gross finger flex for improved grasp.    Baseline  50%    Time  6    Period  Weeks    Status  Achieved   02/09/19:  met     OT SHORT TERM GOAL #4   Title  Pt will be able to sweep with supervision.    Time  6    Period  Weeks    Status  Partially Met   02/06/19:  met in clinic       OT Long Term Goals - 03/23/19 1237      OT LONG TERM GOAL #1   Title  Pt will verbalize understanding of appropriate community fitness activities and updated HEP.--check LTGs 04/05/19    Time  12    Period  Weeks    Status  On-going      OT LONG TERM GOAL #2   Title  Pt will demo at least 125* L shoulder flex without pain for functional reaching.    Time  12    Period  Weeks    Status  On-going   120*  03/23/2019     OT LONG TERM GOAL #3   Title  Pt will improve bilateral hand coordination for ADLs as shown by decr time on 9-hole peg test by at least 5sec.    Baseline  R-35.63, L-40.90sec    Time  12    Period  Weeks    Status  On-going   RUE-32.25, LUE 29.53- met for LUE, not right 03/23/2019     OT LONG TERM GOAL #4   Title  Pt will improve L grip strength by at least 8lbs to assist with opening containers/bottles.    Baseline  R-25.5, L-9.9lbs    Time  12    Period  Weeks    Status  On-going   met-22 lbs     OT LONG TERM GOAL #5   Title  Pt will be able to carry plate/cup at least 15 feet without LOB or spills.    Time  12    Period  Weeks    Status  On-going   still using walker so pt is not performing     OT LONG TERM GOAL #6   Title  Pt will be able to sweep mod I.    Time  12    Period  Weeks    Status  On-going            Plan - 03/23/19 1430    Clinical Impression Statement  Pt is progressing towards goals with improving strength and coordination for the reporting period 01/05/2019-03/23/2019. She can benefit from continued skilled OT to address: LUE strength, fine motor coordination, balance, functional use of bilateral UE's, and ROM, in order to maximize pt's safety and indpendence with ADLs/ IADLs. See pt goals for updated progress.   OT Occupational Profile and History  Detailed Assessment- Review of Records and additional review of physical, cognitive, psychosocial history related to current functional performance    Occupational performance deficits (Please refer to evaluation for details):  ADL's;IADL's    Body Structure / Function / Physical Skills  Mobility;ADL;Balance;Endurance;Strength;UE functional use;ROM;Dexterity;Pain;FMC;Coordination;Decreased knowledge of use of DME;IADL;Sensation    Cognitive Skills  Memory    Rehab Potential  Good    Clinical Decision  Making  Several treatment options, min-mod task modification necessary    Comorbidities  Affecting Occupational Performance:  May have comorbidities impacting occupational performance    Modification or Assistance to Complete Evaluation   Min-Moderate modification of tasks or assist with assess necessary to complete eval    OT Frequency  1x / week   due to financial concerns at pt/husband request   OT Duration  12 weeks   +eval   OT Treatment/Interventions  Self-care/ADL training;Therapeutic exercise;Functional Mobility Training;Manual Therapy;Splinting;Neuromuscular education;Ultrasound;Energy conservation;Therapeutic activities;Cognitive remediation/compensation;DME and/or AE instruction;Paraffin;Cryotherapy;Fluidtherapy;Patient/family education;Passive range of motion;Moist Heat    Plan  continue to address hand function/coordination, functional reach in standing    Consulted and Agree with Plan of Care  Patient;Family member/caregiver    Family Member Consulted  husband       Patient will benefit from skilled therapeutic intervention in order to improve the following deficits and impairments:   Body Structure / Function / Physical Skills: Mobility, ADL, Balance, Endurance, Strength, UE functional use, ROM, Dexterity, Pain, FMC, Coordination, Decreased knowledge of use of DME, IADL, Sensation Cognitive Skills: Memory     Visit Diagnosis: Muscle weakness (generalized)  Hemiplegia and hemiparesis following cerebral infarction affecting right dominant side (HCC)  Other lack of coordination  Other symptoms and signs involving cognitive functions following cerebral infarction  Stiffness of left hand, not elsewhere classified  Stiffness of right hand, not elsewhere classified  Stiffness of left shoulder, not elsewhere classified    Problem List Patient Active Problem List   Diagnosis Date Noted  . Seizure prophylaxis--probable seizure 04/12/2018  . Sacral decubitus ulcer, stage IV ([place]) 04/12/2018  . Acute blood loss anemia   . Impulsive   . Hypokalemia   . ETOH  abuse   . Dysphagia, post-stroke   . Benign essential HTN   . Acute deep vein thrombosis (DVT) of distal vein of both lower extremities (HCC)   . Sinus tachycardia   . Esophageal perforation   . Perforation esophagus   . Sepsis with acute renal failure ([place])   . Acute deep vein thrombosis (DVT) of both peroneal veins ([place])   . Leukocytosis   . Sepsis due to Pseudomonas species ([place])   . Cerebral embolism with cerebral infarction 03/09/2018  . Malnutrition of moderate degree 03/08/2018  . Empyema ([place]) 03/04/2018  . Pressure injury of skin 03/04/2018  . Acute respiratory failure with hypercapnia ([place]) 03/04/2018  . Diarrhea 02/26/2018  . Pancytopenia ([place]) 02/19/2018  . Hypotension 02/19/2018  . Severe protein-calorie malnutrition ([place]) 02/17/2018  . Colitis 02/16/2018  . Weakness 02/16/2018  . Nausea vomiting and diarrhea 02/16/2018  . Diabetes ([place]) 02/16/2018  . Essential hypertension 02/16/2018  . Anemia 02/16/2018    [place] 03/23/2019, 2:35 PM Theone Murdoch, OTR/L [phone] Phone: [phone] 2:35 PM 03/23/19 [facility] [address] [place], [place], [address] Phone: [phone]   Fax:  [phone]  Name: Westlynn E Avila MRN: [id number] Date of Birth: [birth year]

## 2019-03-23 NOTE — Patient Instructions (Signed)
Access Code: [facility]  URL: [email or web address]  Date: 03/23/2019  Prepared by: Cherly Anderson   Exercises Clamshell - 10 reps - 2 sets - 1x daily - 5x weekly Side Stepping with Counter Support - 3 reps - 1 sets - 2x daily - 7x weekly Walking March - 3 reps - 1 sets - 2x daily - 7x weekly head turns side to side - 10 reps - 1 sets - 2x daily - 7x weekly Romberg Stance with Eyes Closed - 3 reps - 1 sets - 20 sec hold - 2x daily - 7x weekly

## 2019-03-30 ENCOUNTER — Encounter: Payer: Self-pay | Admitting: Occupational Therapy

## 2019-03-30 ENCOUNTER — Other Ambulatory Visit: Payer: Self-pay

## 2019-03-30 ENCOUNTER — Ambulatory Visit: Payer: Medicare Other

## 2019-03-30 ENCOUNTER — Ambulatory Visit: Payer: Medicare Other | Admitting: Occupational Therapy

## 2019-03-30 DIAGNOSIS — M6281 Muscle weakness (generalized): Secondary | ICD-10-CM

## 2019-03-30 DIAGNOSIS — I69351 Hemiplegia and hemiparesis following cerebral infarction affecting right dominant side: Secondary | ICD-10-CM

## 2019-03-30 DIAGNOSIS — M25641 Stiffness of right hand, not elsewhere classified: Secondary | ICD-10-CM

## 2019-03-30 DIAGNOSIS — R278 Other lack of coordination: Secondary | ICD-10-CM

## 2019-03-30 DIAGNOSIS — I69318 Other symptoms and signs involving cognitive functions following cerebral infarction: Secondary | ICD-10-CM

## 2019-03-30 DIAGNOSIS — R2689 Other abnormalities of gait and mobility: Secondary | ICD-10-CM

## 2019-03-30 DIAGNOSIS — R2681 Unsteadiness on feet: Secondary | ICD-10-CM | POA: Diagnosis not present

## 2019-03-30 DIAGNOSIS — M25642 Stiffness of left hand, not elsewhere classified: Secondary | ICD-10-CM | POA: Diagnosis not present

## 2019-03-30 DIAGNOSIS — M25612 Stiffness of left shoulder, not elsewhere classified: Secondary | ICD-10-CM

## 2019-03-30 NOTE — Patient Instructions (Signed)
Access Code: [facility]  URL: [email or web address]  Date: 03/30/2019  Prepared by: Cherly Anderson   Exercises Clamshell - 10 reps - 2 sets - 1x daily - 5x weekly Side Stepping with Counter Support - 3 reps - 1 sets - 2x daily - 7x weekly Walking March - 3 reps - 1 sets - 2x daily - 7x weekly head turns side to side - 10 reps - 1 sets - 2x daily - 7x weekly Romberg Stance with Eyes Closed - 3 reps - 1 sets - 20 sec hold - 2x daily - 7x weekly wall bumps - 10 reps - 2 sets - 1x daily - 7x weekly

## 2019-03-30 NOTE — Therapy (Signed)
[facility] [address] [place], [place], [address] Phone: [phone]   Fax:  [phone]  Physical Therapy Treatment/Recert  Patient Details  Name: Maria Avila MRN: [id number] Date of Birth: [birth year] Referring Provider (PT): Jacobo Forest, MD   Encounter Date: 03/30/2019  PT End of Session - 03/30/19 1234    Visit Number  12    Number of Visits  20    Date for PT Re-Evaluation  80/32/12   cert 90 days, poc 60 days   Authorization Type  UHC Medicare  $30 co-pay PT & OT ea. so $60 for both    Authorization Time Period  OOP $3600  $2190.16 met  VL:MN    PT Start Time  1155    PT Stop Time  1234    PT Time Calculation (min)  39 min    Equipment Utilized During Treatment  Gait belt    Activity Tolerance  Patient tolerated treatment well;Patient limited by fatigue    Behavior During Therapy  [facility] for tasks assessed/performed;Flat affect       Past Medical History:  Diagnosis Date  . Colitis   . Diabetes mellitus without complication ([place])   . Hypertension   . Malnutrition ([place])   . Stroke [facility])     Past Surgical History:  Procedure Laterality Date  . BIOPSY  02/26/2018   Procedure: BIOPSY;  Surgeon: Wilford Corner, MD;  Location: [facility];  Service: Endoscopy;;  . COLONOSCOPY WITH PROPOFOL N/A 02/26/2018   Procedure: COLONOSCOPY WITH PROPOFOL;  Surgeon: Wilford Corner, MD;  Location: [facility];  Service: Endoscopy;  Laterality: N/A;  . ESOPHAGOGASTRODUODENOSCOPY (EGD) WITH PROPOFOL N/A 02/26/2018   Procedure: ESOPHAGOGASTRODUODENOSCOPY (EGD) WITH PROPOFOL;  Surgeon: Wilford Corner, MD;  Location: [facility];  Service: Endoscopy;  Laterality: N/A;  . NO PAST SURGERIES     UNCERTAIN OF NAME OF SURGERY    There were no vitals filed for this visit.  Subjective Assessment - 03/30/19 1156    Subjective  Pt just finished in OT. Decided she will continue with PT. Pt reports that she is  still using walker most all the time as fearful of falling. Would like to get where she does not need to use.    Patient is accompained by:  Family member   husband   Pertinent History  Bil CVAs, DM, HTN, Malnutrition    Patient Stated Goals  To walk without walker.    Currently in Pain?  Yes    Pain Score  6     Pain Location  Knee    Pain Orientation  Right;Left    Pain Descriptors / Indicators  Aching   stiff   Pain Onset  More than a month ago    Pain Frequency  Intermittent    Aggravating Factors   sitting longer makes stiff                       [facility] Adult PT Treatment/Exercise - 03/30/19 1209      Ambulation/Gait   Ambulation/Gait  Yes    Ambulation/Gait Assistance  6: Modified independent (Device/Increase time)    Ambulation Distance (Feet)  230 Feet    Assistive device  Rolling walker    Gait Pattern  Step-through pattern    Gait Comments  Pt trialed SPC x 115' CGA then no AD supervision/CGA. Pt needed some cuing for sequencing. As fatigued with both pt had decreased left stance time. Pt ambulated another  115' without AD supervision/CGA.      Neuro Re-ed    Neuro Re-ed Details   Along counter: marching gait 6' x 6 with 1 UE support with cues to control steps, sidestepping 6' x 6 without UE support, tandem gait 6' x 6 with 1 UE support. Standing with feet together eyes open x 30 sec, eyes closed x 30 sec with increased sway, feet together with head turns up/down and left/right x 10. Standing on foam beam perpendicular for ankle strategy 30 sec x 3. Pt losing balance posterior to start and given cues to pull forward with ankles more and showed some improvement. Standing on beam with head turns left/right x 10. Wall bumps x 10             PT Education - 03/30/19 2009    Education Details  Addded wall bumps to HEP.    Person(s) Educated  Patient    Methods  Explanation    Comprehension  Verbalized understanding       PT Short Term Goals - 03/23/19  1332      PT SHORT TERM GOAL #1   Title  Timed Up & Go without device <15seconds with no balance losses.    Baseline  19.84 sec with FWW and 17.17 without AD on 03/16/19    Time  4    Period  Weeks    Status  On-going    Target Date  03/04/19      PT SHORT TERM GOAL #2   Title  Patient ambulates 200' outdoors on paved surfaces & negotiates ramps/curbs with RW with supervision.    Baseline  230' inside on level mod I with FWW, up/down ramp supervision, up/down curb supervision    Time  4    Period  Weeks    Status  Achieved    Target Date  03/04/19      PT SHORT TERM GOAL #3   Title  Patient ambulates 58' around obstacles without device with minA    Baseline  Pt ambulated 200' without device with CGA/ min assist for one slight LOB to the left.    Time  4    Period  Weeks    Status  Achieved    Target Date  03/04/19        PT Long Term Goals - 03/30/19 2012      PT LONG TERM GOAL #1   Title  Patient verbalizes & demonstrates understanding of ongoing HEP including community fitness. (All LTGs Target Date: 04/04/2019)    Baseline  PT continues to update HEP.    Time  12    Period  Weeks    Status  On-going      PT LONG TERM GOAL #2   Title  Berg Balance score >36/56 to indicate lower fall risk.    Baseline  39/56 on 03/23/2019    Time  12    Period  Weeks    Status  Achieved      PT LONG TERM GOAL #3   Title  Timed Up & Go without device <13.5sec safely    Time  12    Period  Weeks    Status  On-going      PT LONG TERM GOAL #4   Title  Patient ambulates community distances 150'  & negotiates ramps/ curbs with RW modified independent.    Baseline  Pt has met distance portion mod I on level but supervsion on ramps/curbs with RW  Time  12    Period  Weeks    Status  On-going      PT LONG TERM GOAL #5   Title  Patient ambulates 100' around furniture without device modified independent.    Baseline  supervision/CGA    Time  12    Period  Weeks    Status   On-going       Updated goals: PT Short Term Goals - 03/30/19 2024      PT SHORT TERM GOAL #1   Title  Timed Up & Go without device <15seconds with no balance losses.    Baseline  19.84 sec with FWW and 17.17 without AD on 03/16/19    Time  4    Period  Weeks    Status  On-going    Target Date  04/29/19      PT SHORT TERM GOAL #2   Title  Pt will ambulate on varied surfaces including up/down ramp and curb with RW mod I for improved community mobility.    Time  4    Period  Weeks    Status  New    Target Date  04/29/19      PT Long Term Goals - 03/30/19 2026      PT LONG TERM GOAL #1   Title  Patient verbalizes & demonstrates understanding of ongoing HEP including community fitness.    Time  8    Period  Weeks    Status  On-going    Target Date  05/29/19      PT LONG TERM GOAL #2   Title  Pt will increase Berg score from 39/56 to >45/56 for improved balance and decreased fall risk.    Time  8    Period  Weeks    Status  New    Target Date  05/29/19      PT LONG TERM GOAL #3   Title  Pt will ambulate >400' with LRAD versus no device for improved gait in home and short community distances.    Time  8    Period  Weeks    Status  New    Target Date  05/29/19           Plan - 03/30/19 2016    Clinical Impression Statement  Pt has shown continued progress over last 8 weeks with improvements on Berg and TUG score. Pt is, however, still at fall risk. Pt using rollator at all times outside of therapy for improved gait safety but has started some training with SPC and without AD in clinic. Pt will continues to benefit from skilled PT to further decrease her balance, strength and gait deficits.    Personal Factors and Comorbidities  Age;Comorbidity 3+;Fitness;Past/Current Experience;Time since onset of injury/illness/exacerbation    Comorbidities  Bil CVAs, DM, HTN, Malnutrition    Examination-Activity Limitations  Carry;Locomotion Level;Reach  Overhead;Squat;Stairs;Stand;Transfers    Examination-Participation Restrictions  Community Activity    Stability/Clinical Decision Making  Evolving/Moderate complexity    Rehab Potential  Good    PT Frequency  1x / week    PT Duration  8 weeks    PT Treatment/Interventions  ADLs/Self Care Home Management;DME Instruction;Gait training;Stair training;Functional mobility training;Therapeutic activities;Therapeutic exercise;Balance training;Neuromuscular re-education;Patient/family education;Vestibular    PT Next Visit Plan  Continue with gait with SPC and without AD to progress balance, standing balance activities working more on ankle strategy.    Consulted and Agree with Plan of Care  Patient  Patient will benefit from skilled therapeutic intervention in order to improve the following deficits and impairments:  Abnormal gait, Cardiopulmonary status limiting activity, Decreased activity tolerance, Decreased balance, Decreased endurance, Decreased knowledge of use of DME, Decreased mobility, Decreased range of motion, Decreased strength, Dizziness, Postural dysfunction, Pain  Visit Diagnosis: Muscle weakness (generalized)  Other abnormalities of gait and mobility     Problem List Patient Active Problem List   Diagnosis Date Noted  . Seizure prophylaxis--probable seizure 04/12/2018  . Sacral decubitus ulcer, stage IV ([place]) 04/12/2018  . Acute blood loss anemia   . Impulsive   . Hypokalemia   . ETOH abuse   . Dysphagia, post-stroke   . Benign essential HTN   . Acute deep vein thrombosis (DVT) of distal vein of both lower extremities (HCC)   . Sinus tachycardia   . Esophageal perforation   . Perforation esophagus   . Sepsis with acute renal failure ([place])   . Acute deep vein thrombosis (DVT) of both peroneal veins ([place])   . Leukocytosis   . Sepsis due to Pseudomonas species ([place])   . Cerebral embolism with cerebral infarction 03/09/2018  . Malnutrition of moderate degree  03/08/2018  . Empyema ([place]) 03/04/2018  . Pressure injury of skin 03/04/2018  . Acute respiratory failure with hypercapnia ([place]) 03/04/2018  . Diarrhea 02/26/2018  . Pancytopenia ([place]) 02/19/2018  . Hypotension 02/19/2018  . Severe protein-calorie malnutrition ([place]) 02/17/2018  . Colitis 02/16/2018  . Weakness 02/16/2018  . Nausea vomiting and diarrhea 02/16/2018  . Diabetes ([place]) 02/16/2018  . Essential hypertension 02/16/2018  . Anemia 02/16/2018    Electa Sniff, PT, DPT, NCS 03/30/2019, 8:23 PM  [facility] [address] [place], [place], [address] Phone: [phone]   Fax:  [phone]  Name: Sharah E Avila MRN: [id number] Date of Birth: 07-21-1950

## 2019-03-30 NOTE — Therapy (Signed)
[facility] [address] [place], [place], [address] Phone: [phone]   Fax:  [phone]  Occupational Therapy Treatment  Patient Details  Name: Maria Avila MRN: [id number] Date of Birth: Sep 08, 1950 No data recorded  Encounter Date: 03/30/2019  OT End of Session - 03/30/19 1108    Visit Number  11    Number of Visits  13    Date for OT Re-Evaluation  04/05/19    Authorization Type  [employer]HC Medicare, $30 copay each OT/PT (seperate)    Authorization - Visit Number  11    Authorization - Number of Visits  20    OT Start Time  1106    OT Stop Time  1145    OT Time Calculation (min)  39 min    Activity Tolerance  Patient tolerated treatment well    Behavior During Therapy  [facility] for tasks assessed/performed       Past Medical History:  Diagnosis Date  . Colitis   . Diabetes mellitus without complication ([place])   . Hypertension   . Malnutrition ([place])   . Stroke [facility])     Past Surgical History:  Procedure Laterality Date  . BIOPSY  02/26/2018   Procedure: BIOPSY;  Surgeon: Wilford Corner, MD;  Location: [facility];  Service: Endoscopy;;  . COLONOSCOPY WITH PROPOFOL N/A 02/26/2018   Procedure: COLONOSCOPY WITH PROPOFOL;  Surgeon: Wilford Corner, MD;  Location: [facility];  Service: Endoscopy;  Laterality: N/A;  . ESOPHAGOGASTRODUODENOSCOPY (EGD) WITH PROPOFOL N/A 02/26/2018   Procedure: ESOPHAGOGASTRODUODENOSCOPY (EGD) WITH PROPOFOL;  Surgeon: Wilford Corner, MD;  Location: [facility];  Service: Endoscopy;  Laterality: N/A;  . NO PAST SURGERIES     UNCERTAIN OF NAME OF SURGERY    There were no vitals filed for this visit.  Subjective Assessment - 03/30/19 1107    Subjective   stiffness in hands    Pertinent History  bilateral infarcts fall 2019.    PMH:  hx of questionable seizures, malnutrition, hypertension, diabetes, colitis, hx of heavy alcohol use, DM, HTN, hx of empyema/respiratory failure  and sepsis 10/19    Limitations  fall risk, questionable seizures    Patient Stated Goals  improve ability to use hands, open bottle, sweep    Currently in Pain?  No/denies    Pain Onset  More than a month ago           Recommended that pt use Arm Bike at the [facility] for continued fitness along with HEP.  Arm Bike x57mn level 3 for conditioning/reciprocal movement without rest, forward/backwards.  Picking up blocks with gripper set on level 2 (black spring) for sustained grip strength with min-mod difficulty, primarily with L hand.  In standing, functional reaching to place/remove clothespins with 1-8lb resistance on vertical pole with wt. Shift/trunk rotation for balance and bilateral UE functional reach.  Placing grooved pegs in pegboard with each hand for in-hand manipulation with min-mod difficulty, particularly with L hand.  Standing, shoulder flex AAROM to push ball up wall with min facilitation LUE for scapula movement.  Discussed progress and pt is in agreement with d/c from OT at this time.  Pt encouraged to continue with HEP.       OT Short Term Goals - 03/30/19 1110      OT SHORT TERM GOAL #1   Title  Pt will be independent with intitial HEP.--check STGs 02/23/19    Time  6    Period  Weeks    Status  Achieved      OT SHORT TERM GOAL #2   Title  Pt will demo at least 120* L shoulder flex for functional reaching without pain.    Baseline  110* scaption    Time  6    Period  Weeks    Status  Achieved   120 with min compensations and no pain-03/23/2019     OT SHORT TERM GOAL #3   Title  Pt will demo at least 75% L gross finger flex for improved grasp.    Baseline  50%    Time  6    Period  Weeks    Status  Achieved   02/09/19:  met     OT SHORT TERM GOAL #4   Title  Pt will be able to sweep with supervision.    Time  6    Period  Weeks    Status  Achieved   02/06/19:  met in clinic.  03/30/19:  has vacuumed at home (RW close by)       OT Long Term  Goals - 03/30/19 1111      OT LONG TERM GOAL #1   Title  Pt will verbalize understanding of appropriate community fitness activities and updated HEP.--check LTGs 04/05/19    Time  12    Period  Weeks    Status  Achieved      OT LONG TERM GOAL #2   Title  Pt will demo at least 125* L shoulder flex without pain for functional reaching.    Time  12    Period  Weeks    Status  Achieved   120* 03/23/2019.  03/30/19:  140*     OT LONG TERM GOAL #3   Title  Pt will improve bilateral hand coordination for ADLs as shown by decr time on 9-hole peg test by at least 5sec.    Baseline  R-35.63, L-40.90sec    Time  12    Period  Weeks    Status  Partially Met   RUE-32.25, LUE 29.53- met for LUE, not right 03/23/2019     OT LONG TERM GOAL #4   Title  Pt will improve L grip strength by at least 8lbs to assist with opening containers/bottles.    Baseline  R-25.5, L-9.9lbs    Time  12    Period  Weeks    Status  Achieved   met-22 lbs     OT LONG TERM GOAL #5   Title  Pt will be able to carry plate/cup at least 15 feet without LOB or spills.    Time  12    Period  Weeks    Status  Not Met   still using walker so pt is not performing     OT LONG TERM GOAL #6   Title  Pt will be able to sweep mod I.    Time  12    Period  Weeks    Status  Partially Met   03/30/19:  met with vacuuming           Plan - 03/30/19 1109    Clinical Impression Statement  Pt is progressing towards goals with improving strength and coordination for the reporting period 01/05/2019-03/23/2019. She can benefit from continued skilled OT to address: LUE strength, fine motor coordination, balance, functional use of bilateral UE's, and ROM, in order to maximize pt's safety and indpendence with ADLs/ IADLs.    OT Occupational Profile and History  Detailed Assessment-  Review of Records and additional review of physical, cognitive, psychosocial history related to current functional performance    Occupational performance  deficits (Please refer to evaluation for details):  ADL's;IADL's    Body Structure / Function / Physical Skills  Mobility;ADL;Balance;Endurance;Strength;UE functional use;ROM;Dexterity;Pain;FMC;Coordination;Decreased knowledge of use of DME;IADL;Sensation    Cognitive Skills  Memory    Rehab Potential  Good    Clinical Decision Making  Several treatment options, min-mod task modification necessary    Comorbidities Affecting Occupational Performance:  May have comorbidities impacting occupational performance    Modification or Assistance to Complete Evaluation   Min-Moderate modification of tasks or assist with assess necessary to complete eval    OT Frequency  1x / week   due to financial concerns at pt/husband request   OT Duration  12 weeks   +eval   OT Treatment/Interventions  Self-care/ADL training;Therapeutic exercise;Functional Mobility Training;Manual Therapy;Splinting;Neuromuscular education;Ultrasound;Energy conservation;Therapeutic activities;Cognitive remediation/compensation;DME and/or AE instruction;Paraffin;Cryotherapy;Fluidtherapy;Patient/family education;Passive range of motion;Moist Heat    Plan  continue to address hand function/coordination, functional reach in standing    Consulted and Agree with Plan of Care  Patient;Family member/caregiver    Family Member Consulted  husband       Patient will benefit from skilled therapeutic intervention in order to improve the following deficits and impairments:   Body Structure / Function / Physical Skills: Mobility, ADL, Balance, Endurance, Strength, UE functional use, ROM, Dexterity, Pain, FMC, Coordination, Decreased knowledge of use of DME, IADL, Sensation Cognitive Skills: Memory     Visit Diagnosis: Muscle weakness (generalized)  Other abnormalities of gait and mobility  Hemiplegia and hemiparesis following cerebral infarction affecting right dominant side (HCC)  Other lack of coordination  Other symptoms and signs  involving cognitive functions following cerebral infarction  Stiffness of left hand, not elsewhere classified  Stiffness of right hand, not elsewhere classified  Stiffness of left shoulder, not elsewhere classified  Unsteadiness on feet    Problem List Patient Active Problem List   Diagnosis Date Noted  . Seizure prophylaxis--probable seizure 04/12/2018  . Sacral decubitus ulcer, stage IV ([place]) 04/12/2018  . Acute blood loss anemia   . Impulsive   . Hypokalemia   . ETOH abuse   . Dysphagia, post-stroke   . Benign essential HTN   . Acute deep vein thrombosis (DVT) of distal vein of both lower extremities (HCC)   . Sinus tachycardia   . Esophageal perforation   . Perforation esophagus   . Sepsis with acute renal failure ([place])   . Acute deep vein thrombosis (DVT) of both peroneal veins ([place])   . Leukocytosis   . Sepsis due to Pseudomonas species ([place])   . Cerebral embolism with cerebral infarction 03/09/2018  . Malnutrition of moderate degree 03/08/2018  . Empyema ([place]) 03/04/2018  . Pressure injury of skin 03/04/2018  . Acute respiratory failure with hypercapnia ([place]) 03/04/2018  . Diarrhea 02/26/2018  . Pancytopenia ([place]) 02/19/2018  . Hypotension 02/19/2018  . Severe protein-calorie malnutrition ([place]) 02/17/2018  . Colitis 02/16/2018  . Weakness 02/16/2018  . Nausea vomiting and diarrhea 02/16/2018  . Diabetes ([place]) 02/16/2018  . Essential hypertension 02/16/2018  . Anemia 02/16/2018      OCCUPATIONAL THERAPY DISCHARGE SUMMARY  Visits from Start of Care: 11  Current functional level related to goals / functional outcomes: See above   Remaining deficits: decr strength, decr ROM, decr coordination--improved decr balance for IADLs--will continue with PT   Education / Equipment: Pt instructed in HEP, appropriate community exercise.  Plan: Patient  agrees to discharge.  Patient goals were partially met. Patient is being discharged due to being pleased with  the current functional level.  ?????      [facility] 03/30/2019, 11:45 AM  [facility] [address] [place], [place], [address] Phone: [phone]   Fax:  [phone]  Name: Messina E Avila MRN: [id number] Date of Birth: 08-24-1950   Vianne Bulls, OTR/L [facility] [address] [place], [place]  [address] [phone] phone [phone] 03/30/19 11:45 AM

## 2019-04-02 DIAGNOSIS — I639 Cerebral infarction, unspecified: Secondary | ICD-10-CM | POA: Diagnosis not present

## 2019-04-02 DIAGNOSIS — L89154 Pressure ulcer of sacral region, stage 4: Secondary | ICD-10-CM | POA: Diagnosis not present

## 2019-04-02 DIAGNOSIS — T8189XA Other complications of procedures, not elsewhere classified, initial encounter: Secondary | ICD-10-CM | POA: Diagnosis not present

## 2019-04-12 ENCOUNTER — Ambulatory Visit: Payer: Medicare Other

## 2019-04-27 ENCOUNTER — Encounter: Payer: Self-pay | Admitting: Physical Therapy

## 2019-04-27 ENCOUNTER — Ambulatory Visit: Payer: Medicare Other | Attending: Physical Medicine & Rehabilitation | Admitting: Physical Therapy

## 2019-04-27 ENCOUNTER — Other Ambulatory Visit: Payer: Self-pay

## 2019-04-27 DIAGNOSIS — M6281 Muscle weakness (generalized): Secondary | ICD-10-CM

## 2019-04-27 DIAGNOSIS — R278 Other lack of coordination: Secondary | ICD-10-CM | POA: Insufficient documentation

## 2019-04-27 DIAGNOSIS — R2689 Other abnormalities of gait and mobility: Secondary | ICD-10-CM

## 2019-04-27 DIAGNOSIS — R2681 Unsteadiness on feet: Secondary | ICD-10-CM | POA: Diagnosis not present

## 2019-04-27 DIAGNOSIS — I69351 Hemiplegia and hemiparesis following cerebral infarction affecting right dominant side: Secondary | ICD-10-CM | POA: Diagnosis not present

## 2019-04-27 DIAGNOSIS — R293 Abnormal posture: Secondary | ICD-10-CM | POA: Insufficient documentation

## 2019-04-27 NOTE — Therapy (Signed)
[facility] [address] [place], [place], [address] Phone: [phone]   Fax:  [phone]  Physical Therapy Treatment  Patient Details  Name: Maria Avila MRN: [id number] Date of Birth: 03-23-1951 Referring Provider (PT): Jacobo Forest, MD   Encounter Date: 04/27/2019  PT End of Session - 04/27/19 1616    Visit Number  13    Number of Visits  20    Date for PT Re-Evaluation  79/39/03   cert 90 days, poc 60 days   Authorization Type  UHC Medicare  $30 co-pay PT & OT ea. so $60 for both    Authorization Time Period  OOP $3600  $2190.16 met  VL:MN    PT Start Time  1530    PT Stop Time  1615    PT Time Calculation (min)  45 min    Equipment Utilized During Treatment  Gait belt    Activity Tolerance  Patient tolerated treatment well;Patient limited by fatigue    Behavior During Therapy  [facility] for tasks assessed/performed;Flat affect       Past Medical History:  Diagnosis Date  . Colitis   . Diabetes mellitus without complication ([place])   . Hypertension   . Malnutrition ([place])   . Stroke [facility])     Past Surgical History:  Procedure Laterality Date  . BIOPSY  02/26/2018   Procedure: BIOPSY;  Surgeon: Wilford Corner, MD;  Location: [facility];  Service: Endoscopy;;  . COLONOSCOPY WITH PROPOFOL N/A 02/26/2018   Procedure: COLONOSCOPY WITH PROPOFOL;  Surgeon: Wilford Corner, MD;  Location: [facility];  Service: Endoscopy;  Laterality: N/A;  . ESOPHAGOGASTRODUODENOSCOPY (EGD) WITH PROPOFOL N/A 02/26/2018   Procedure: ESOPHAGOGASTRODUODENOSCOPY (EGD) WITH PROPOFOL;  Surgeon: Wilford Corner, MD;  Location: [facility];  Service: Endoscopy;  Laterality: N/A;  . NO PAST SURGERIES     UNCERTAIN OF NAME OF SURGERY    There were no vitals filed for this visit.  Subjective Assessment - 04/27/19 1530    Subjective  No falls. She goes to [facility] 3x last week. She does the leg press & the recumbent stepper.     Patient is accompained by:  Family member   husband   Pertinent History  Bil CVAs, DM, HTN, Malnutrition    Patient Stated Goals  To walk without walker.    Currently in Pain?  No/denies    Pain Onset  More than a month ago         [facility] PT Assessment - 04/27/19 1530      Assessment   Medical Diagnosis  CVA    Referring Provider (PT)  Jacobo Forest, MD      Timed Up and Go Test   Normal TUG (seconds)  15.89   no device, initial 2 steps unsteady & 180* minA                  [facility] Adult PT Treatment/Exercise - 04/27/19 1530      Ambulation/Gait   Ambulation/Gait  Yes    Ambulation/Gait Assistance  4: Min assist    Ambulation/Gait Assistance Details  tactile & verbal cues on balance reactions, upright posture and step length. Worked on scanning right/left & up/down while ambulating without assistive device.     Ambulation Distance (Feet)  150 Feet   150' X 2   Assistive device  None   arrived /exited with RW, session no AD   Gait Pattern  Step-through pattern    Ambulation Surface  Indoor;Level  Gait Comments  --      Neuro Re-ed    Neuro Re-ed Details   see pt instructions for updated HEP to address balance: stand up walk near counter turn 180* towards counter return to chair turning towards counter to sit;           Access Code: [facility]  URL: [email or web address]  Date: 04/27/2019  Prepared by: Jamey Reas   Exercises Clamshell - 10 reps - 2 sets - 1x daily - 5x weekly Side Stepping with Counter Support - 3 reps - 1 sets - 2x daily - 7x weekly Walking March - 3 reps - 1 sets - 2x daily - 7x weekly Forward Walking with Half Turns - Slow - 10 reps - 1 sets - 1-2x daily - 7x weekly hip width stance head turns - 10 reps - 1 sets - 2x daily - 7x weekly Feet Together with Eyes Closed - 3 reps - 1 sets - 20 sec hold - 2x daily - 7x weekly wall bumps - 10 reps - 2 sets - 1x daily - 7x weekly 360 Degree Turn in Both Directions - 10 reps -  1 sets - 1-2x daily - 7x weekly Sit to Stand - 10 reps - 1 sets - 2x daily - 7x weekly     PT Education - 04/27/19 1615    Education Details  updated HEP [facility]    Person(s) Educated  Patient    Methods  Explanation;Demonstration;Tactile cues;Verbal cues;Handout    Comprehension  Verbalized understanding;Returned demonstration;Verbal cues required;Tactile cues required;Need further instruction       PT Short Term Goals - 04/27/19 2352      PT SHORT TERM GOAL #1   Title  Timed Up & Go without device <15seconds with no balance losses.    Baseline  NOT MET 04/27/2019  TUG no AD 15.89sec with balance loss 180* turn    Time  4    Period  Weeks    Status  Not Met    Target Date  04/29/19      PT SHORT TERM GOAL #2   Title  Pt will ambulate on varied surfaces including up/down ramp and curb with RW mod I for improved community mobility.    Baseline  MET per 11/25 note    Time  4    Period  Weeks    Status  Achieved    Target Date  04/29/19        PT Long Term Goals - 03/30/19 2026      PT LONG TERM GOAL #1   Title  Patient verbalizes & demonstrates understanding of ongoing HEP including community fitness.    Time  8    Period  Weeks    Status  On-going    Target Date  05/29/19      PT LONG TERM GOAL #2   Title  Pt will increase Berg score from 39/56 to >45/56 for improved balance and decreased fall risk.    Time  8    Period  Weeks    Status  New    Target Date  05/29/19      PT LONG TERM GOAL #3   Title  Pt will ambulate >400' with LRAD versus no device for improved gait in home and short community distances.    Time  8    Period  Weeks    Status  New    Target Date  05/29/19  Plan - 04/27/19 2353    Clinical Impression Statement  PT updated HEP to include more balance activities. Patient appears to understand updated HEP which are set up near counter or in corner for safety.    Personal Factors and Comorbidities  Age;Comorbidity  3+;Fitness;Past/Current Experience;Time since onset of injury/illness/exacerbation    Comorbidities  Bil CVAs, DM, HTN, Malnutrition    Examination-Activity Limitations  Carry;Locomotion Level;Reach Overhead;Squat;Stairs;Stand;Transfers    Examination-Participation Restrictions  Community Activity    Stability/Clinical Decision Making  Evolving/Moderate complexity    Rehab Potential  Good    PT Frequency  1x / week    PT Duration  8 weeks    PT Treatment/Interventions  ADLs/Self Care Home Management;DME Instruction;Gait training;Stair training;Functional mobility training;Therapeutic activities;Therapeutic exercise;Balance training;Neuromuscular re-education;Patient/family education;Vestibular    PT Next Visit Plan  Continue with gait with SPC and without AD to progress balance, standing balance activities working more on ankle strategy.    Consulted and Agree with Plan of Care  Patient       Patient will benefit from skilled therapeutic intervention in order to improve the following deficits and impairments:  Abnormal gait, Cardiopulmonary status limiting activity, Decreased activity tolerance, Decreased balance, Decreased endurance, Decreased knowledge of use of DME, Decreased mobility, Decreased range of motion, Decreased strength, Dizziness, Postural dysfunction, Pain  Visit Diagnosis: Other abnormalities of gait and mobility  Unsteadiness on feet  Abnormal posture  Muscle weakness  Hemiplegia and hemiparesis following cerebral infarction affecting right dominant side [facility])     Problem List Patient Active Problem List   Diagnosis Date Noted  . Seizure prophylaxis--probable seizure 04/12/2018  . Sacral decubitus ulcer, stage IV ([place]) 04/12/2018  . Acute blood loss anemia   . Impulsive   . Hypokalemia   . ETOH abuse   . Dysphagia, post-stroke   . Benign essential HTN   . Acute deep vein thrombosis (DVT) of distal vein of both lower extremities (HCC)   . Sinus tachycardia    . Esophageal perforation   . Perforation esophagus   . Sepsis with acute renal failure ([place])   . Acute deep vein thrombosis (DVT) of both peroneal veins ([place])   . Leukocytosis   . Sepsis due to Pseudomonas species ([place])   . Cerebral embolism with cerebral infarction 03/09/2018  . Malnutrition of moderate degree 03/08/2018  . Empyema ([place]) 03/04/2018  . Pressure injury of skin 03/04/2018  . Acute respiratory failure with hypercapnia ([place]) 03/04/2018  . Diarrhea 02/26/2018  . Pancytopenia ([place]) 02/19/2018  . Hypotension 02/19/2018  . Severe protein-calorie malnutrition ([place]) 02/17/2018  . Colitis 02/16/2018  . Weakness 02/16/2018  . Nausea vomiting and diarrhea 02/16/2018  . Diabetes ([place]) 02/16/2018  . Essential hypertension 02/16/2018  . Anemia 02/16/2018    Jamey Reas PT, DPT 04/27/2019, 11:55 PM  [facility] [address] [place], [place], [address] Phone: [phone]   Fax:  [phone]  Name: Tacie E Avila MRN: [id number] Date of Birth: 05-Nov-1950

## 2019-04-27 NOTE — Patient Instructions (Signed)
Access Code: [facility]  URL: [email or web address]  Date: 04/27/2019  Prepared by: Jamey Reas   Exercises Clamshell - 10 reps - 2 sets - 1x daily - 5x weekly Side Stepping with Counter Support - 3 reps - 1 sets - 2x daily - 7x weekly Walking March - 3 reps - 1 sets - 2x daily - 7x weekly Forward Walking with Half Turns - Slow - 10 reps - 1 sets - 1-2x daily - 7x weekly hip width stance head turns - 10 reps - 1 sets - 2x daily - 7x weekly Feet Together with Eyes Closed - 3 reps - 1 sets - 20 sec hold - 2x daily - 7x weekly wall bumps - 10 reps - 2 sets - 1x daily - 7x weekly 360 Degree Turn in Both Directions - 10 reps - 1 sets - 1-2x daily - 7x weekly Sit to Stand - 10 reps - 1 sets - 2x daily - 7x weekly

## 2019-05-04 ENCOUNTER — Encounter: Payer: Self-pay | Admitting: Physical Therapy

## 2019-05-04 ENCOUNTER — Ambulatory Visit: Payer: Medicare Other | Admitting: Physical Therapy

## 2019-05-04 ENCOUNTER — Other Ambulatory Visit: Payer: Self-pay

## 2019-05-04 DIAGNOSIS — I69351 Hemiplegia and hemiparesis following cerebral infarction affecting right dominant side: Secondary | ICD-10-CM

## 2019-05-04 DIAGNOSIS — R293 Abnormal posture: Secondary | ICD-10-CM | POA: Diagnosis not present

## 2019-05-04 DIAGNOSIS — M6281 Muscle weakness (generalized): Secondary | ICD-10-CM

## 2019-05-04 DIAGNOSIS — R2681 Unsteadiness on feet: Secondary | ICD-10-CM | POA: Diagnosis not present

## 2019-05-04 DIAGNOSIS — R278 Other lack of coordination: Secondary | ICD-10-CM

## 2019-05-04 DIAGNOSIS — R2689 Other abnormalities of gait and mobility: Secondary | ICD-10-CM | POA: Diagnosis not present

## 2019-05-05 NOTE — Therapy (Signed)
[facility] [address] [place], [place], [address] Phone: [phone]   Fax:  [phone]  Physical Therapy Treatment  Patient Details  Name: Maria Avila MRN: [id number] Date of Birth: 12/01/1950 Referring Provider (PT): Jacobo Forest, MD   Encounter Date: 05/04/2019  PT End of Session - 05/04/19 1449    Visit Number  14    Number of Visits  20    Date for PT Re-Evaluation  15/72/62   cert 90 days, poc 60 days   Authorization Type  [employer]HC Medicare  $30 co-pay PT & OT ea. so $60 for both    Authorization Time Period  OOP $3600  $2190.16 met  VL:MN    PT Start Time  1400    PT Stop Time  1445    PT Time Calculation (min)  45 min    Equipment Utilized During Treatment  Gait belt    Activity Tolerance  Patient tolerated treatment well;Patient limited by fatigue    Behavior During Therapy  [facility] for tasks assessed/performed;Flat affect       Past Medical History:  Diagnosis Date  . Colitis   . Diabetes mellitus without complication ([place])   . Hypertension   . Malnutrition ([place])   . Stroke [facility])     Past Surgical History:  Procedure Laterality Date  . BIOPSY  02/26/2018   Procedure: BIOPSY;  Surgeon: Wilford Corner, MD;  Location: [facility];  Service: Endoscopy;;  . COLONOSCOPY WITH PROPOFOL N/A 02/26/2018   Procedure: COLONOSCOPY WITH PROPOFOL;  Surgeon: Wilford Corner, MD;  Location: [facility];  Service: Endoscopy;  Laterality: N/A;  . ESOPHAGOGASTRODUODENOSCOPY (EGD) WITH PROPOFOL N/A 02/26/2018   Procedure: ESOPHAGOGASTRODUODENOSCOPY (EGD) WITH PROPOFOL;  Surgeon: Wilford Corner, MD;  Location: [facility];  Service: Endoscopy;  Laterality: N/A;  . NO PAST SURGERIES     UNCERTAIN OF NAME OF SURGERY    There were no vitals filed for this visit.  Subjective Assessment - 05/04/19 1400    Subjective  She has been doing the exercises including new ones without any issues. No falls. She only  tries to walk without walker near counter.    Patient is accompained by:  Family member   husband   Pertinent History  Bil CVAs, DM, HTN, Malnutrition    Patient Stated Goals  To walk without walker.    Currently in Pain?  No/denies    Pain Onset  More than a month ago                       [facility] Adult PT Treatment/Exercise - 05/04/19 1400      Ambulation/Gait   Ambulation/Gait  Yes    Ambulation/Gait Assistance  4: Min assist;4: Min guard    Ambulation/Gait Assistance Details  worked on household gait without device and community gait with single point cane with std tip & with quad tip. She prefers std tip.  Tactile /intermittent minA for stability with mild balance losses more in turns & scanning.     Ambulation Distance (Feet)  200 Feet   125' X 2 no AD, 150' X 3 with cane   Assistive device  None;Straight cane   arrived /exited with RW,    Gait Pattern  Step-through pattern    Ambulation Surface  Level;Indoor    Ramp  4: Min assist   single point cane   Ramp Details (indicate cue type and reason)  demo, verbal & tactile cues on posture,  slowing pace but not stopping to utilize momentum and sequencing with cane.     Curb  4: Min assist   single point cane   Curb Details (indicate cue type and reason)  demo, verbal & tactile cues on sequencing with cane and step thru with 2nd LE so momentum helps balance /stabilization      Neuro Re-ed    Neuro Re-ed Details   see pt instructions for updated HEP to address balance: stand up walk near counter turn 180* towards counter return to chair turning towards counter to sit;               PT Education - 05/04/19 1445    Education Details  with husband's assist / contact guard with belt: ambulate in home without device and limited community (<150' on paved flat surfaces) with cane    Person(s) Educated  Patient;Spouse    Methods  Explanation;Verbal cues;Demonstration   demo how to hold belt & positioning for assisting    Comprehension  Verbalized understanding;Need further instruction       PT Short Term Goals - 04/27/19 2352      PT SHORT TERM GOAL #1   Title  Timed Up & Go without device <15seconds with no balance losses.    Baseline  NOT MET 04/27/2019  TUG no AD 15.89sec with balance loss 180* turn    Time  4    Period  Weeks    Status  Not Met    Target Date  04/29/19      PT SHORT TERM GOAL #2   Title  Pt will ambulate on varied surfaces including up/down ramp and curb with RW mod I for improved community mobility.    Baseline  MET per 11/25 note    Time  4    Period  Weeks    Status  Achieved    Target Date  04/29/19        PT Long Term Goals - 03/30/19 2026      PT LONG TERM GOAL #1   Title  Patient verbalizes & demonstrates understanding of ongoing HEP including community fitness.    Time  8    Period  Weeks    Status  On-going    Target Date  05/29/19      PT LONG TERM GOAL #2   Title  Pt will increase Berg score from 39/56 to >45/56 for improved balance and decreased fall risk.    Time  8    Period  Weeks    Status  New    Target Date  05/29/19      PT LONG TERM GOAL #3   Title  Pt will ambulate >400' with LRAD versus no device for improved gait in home and short community distances.    Time  8    Period  Weeks    Status  New    Target Date  05/29/19            Plan - 05/04/19 1946    Clinical Impression Statement  Today's session focused on gait in simulated home environment without device and commuity based with distance & ramps/curbs with cane. PT trialed both single point cane with std tip and with quad tip with no functional difference noted. Pt perferred std tip on single point cane.    Personal Factors and Comorbidities  Age;Comorbidity 3+;Fitness;Past/Current Experience;Time since onset of injury/illness/exacerbation    Comorbidities  Bil CVAs, DM, HTN, Malnutrition  Examination-Activity Limitations  Carry;Locomotion Level;Reach  Overhead;Squat;Stairs;Stand;Transfers    Examination-Participation Restrictions  Community Activity    Stability/Clinical Decision Making  Evolving/Moderate complexity    Rehab Potential  Good    PT Frequency  1x / week    PT Duration  8 weeks    PT Treatment/Interventions  ADLs/Self Care Home Management;DME Instruction;Gait training;Stair training;Functional mobility training;Therapeutic activities;Therapeutic exercise;Balance training;Neuromuscular re-education;Patient/family education;Vestibular    PT Next Visit Plan  Continue with gait with SPC for community including ramps/curgs and without AD for household including carrying cup or plate to progress balance, standing balance activities working more on ankle strategy.    Consulted and Agree with Plan of Care  Patient       Patient will benefit from skilled therapeutic intervention in order to improve the following deficits and impairments:  Abnormal gait, Cardiopulmonary status limiting activity, Decreased activity tolerance, Decreased balance, Decreased endurance, Decreased knowledge of use of DME, Decreased mobility, Decreased range of motion, Decreased strength, Dizziness, Postural dysfunction, Pain  Visit Diagnosis: Other abnormalities of gait and mobility  Unsteadiness on feet  Abnormal posture  Muscle weakness  Hemiplegia and hemiparesis following cerebral infarction affecting right dominant side (HCC)  Other lack of coordination     Problem List Patient Active Problem List   Diagnosis Date Noted  . Seizure prophylaxis--probable seizure 04/12/2018  . Sacral decubitus ulcer, stage IV ([place]) 04/12/2018  . Acute blood loss anemia   . Impulsive   . Hypokalemia   . ETOH abuse   . Dysphagia, post-stroke   . Benign essential HTN   . Acute deep vein thrombosis (DVT) of distal vein of both lower extremities (HCC)   . Sinus tachycardia   . Esophageal perforation   . Perforation esophagus   . Sepsis with acute renal  failure ([place])   . Acute deep vein thrombosis (DVT) of both peroneal veins ([place])   . Leukocytosis   . Sepsis due to Pseudomonas species ([place])   . Cerebral embolism with cerebral infarction 03/09/2018  . Malnutrition of moderate degree 03/08/2018  . Empyema ([place]) 03/04/2018  . Pressure injury of skin 03/04/2018  . Acute respiratory failure with hypercapnia ([place]) 03/04/2018  . Diarrhea 02/26/2018  . Pancytopenia ([place]) 02/19/2018  . Hypotension 02/19/2018  . Severe protein-calorie malnutrition ([place]) 02/17/2018  . Colitis 02/16/2018  . Weakness 02/16/2018  . Nausea vomiting and diarrhea 02/16/2018  . Diabetes ([place]) 02/16/2018  . Essential hypertension 02/16/2018  . Anemia 02/16/2018    Jamey Reas PT, DPT 05/05/2019, 9:50 AM  [facility] [address] [place], [place], [address] Phone: [phone]   Fax:  [phone]  Name: Maria Avila MRN: [id number] Date of Birth: [birth year]

## 2019-05-11 ENCOUNTER — Ambulatory Visit: Payer: Medicare Other | Attending: Physical Medicine & Rehabilitation

## 2019-05-11 ENCOUNTER — Other Ambulatory Visit: Payer: Self-pay

## 2019-05-11 DIAGNOSIS — M6281 Muscle weakness (generalized): Secondary | ICD-10-CM | POA: Insufficient documentation

## 2019-05-11 DIAGNOSIS — R2689 Other abnormalities of gait and mobility: Secondary | ICD-10-CM | POA: Insufficient documentation

## 2019-05-11 NOTE — Therapy (Signed)
[facility] [address] [place], [place], [address] Phone: [phone]   Fax:  [phone]  Physical Therapy Treatment  Patient Details  Name: Maria Avila MRN: [id number] Date of Birth: 1950/10/15 Referring Provider (PT): Jacobo Forest, MD   Encounter Date: 05/11/2019  PT End of Session - 05/11/19 1231    Visit Number  15    Number of Visits  20    Date for PT Re-Evaluation  38/93/73   cert 90 days, poc 60 days   Authorization Type  UHC Medicare  $30 co-pay PT & OT ea. so $60 for both    Authorization Time Period  OOP $3600  $2190.16 met  VL:MN    PT Start Time  1230    PT Stop Time  1314    PT Time Calculation (min)  44 min    Equipment Utilized During Treatment  Gait belt    Activity Tolerance  Patient tolerated treatment well;Patient limited by fatigue    Behavior During Therapy  [facility] for tasks assessed/performed;Flat affect       Past Medical History:  Diagnosis Date  . Colitis   . Diabetes mellitus without complication ([place])   . Hypertension   . Malnutrition ([place])   . Stroke [facility])     Past Surgical History:  Procedure Laterality Date  . BIOPSY  02/26/2018   Procedure: BIOPSY;  Surgeon: Wilford Corner, MD;  Location: [place];  Service: Endoscopy;;  . COLONOSCOPY WITH PROPOFOL N/A 02/26/2018   Procedure: COLONOSCOPY WITH PROPOFOL;  Surgeon: Wilford Corner, MD;  Location: [place];  Service: Endoscopy;  Laterality: N/A;  . ESOPHAGOGASTRODUODENOSCOPY (EGD) WITH PROPOFOL N/A 02/26/2018   Procedure: ESOPHAGOGASTRODUODENOSCOPY (EGD) WITH PROPOFOL;  Surgeon: Wilford Corner, MD;  Location: [place];  Service: Endoscopy;  Laterality: N/A;  . NO PAST SURGERIES     UNCERTAIN OF NAME OF SURGERY    There were no vitals filed for this visit.  Subjective Assessment - 05/11/19 1758    Subjective  Pt reports that she has been doing well. [facility]    Patient is  accompained by:  Family member   husband   Pertinent History  Bil CVAs, DM, HTN, Malnutrition    Patient Stated Goals  To walk without walker.    Currently in Pain?  No/denies    Pain Onset  More than a month ago                       [facility] Adult PT Treatment/Exercise - 05/11/19 1231      Ambulation/Gait   Ambulation/Gait  Yes    Ambulation/Gait Assistance  4: Min guard    Ambulation/Gait Assistance Details  Verbal cues for increased step length.    Ambulation Distance (Feet)  230 Feet    Assistive device  Straight cane    Gait Pattern  Step-through pattern;Decreased step length - right;Decreased step length - left    Ambulation Surface  Level;Indoor    Ramp  4: Min assist    Ramp Details (indicate cue type and reason)  Performed x 3 with SPC. Verbal cues for technique.    Curb  4: Min assist    Curb Details (indicate cue type and reason)  Pt performed x 3. Verbal cues for sequencing with cane. Pt tried leading with both legs as did not feel one was much stronger than other.    Gait Comments  Pt reports stiffness  in right knee with gait. Pt ambulated without AD at end of session 115' with CGA/min assist.       Neuro Re-ed    Neuro Re-ed Details   Step-ups on 4" step x 10 each leg with right UE support then repeated with only SPC support CGA x 10 left and x 5 right. Pt was less stable with right foot step-ups. Standing across blue foam with toes and heels hanging off 30 sec x 3 with losing balance posterior at times CGA. Tandem stance on floor x 30 sec each position CGA, feet together eyes open and eyes closed x 30 sec each.             PT Education - 05/11/19 1310    Education Details  PT discussed walking more at gym with walker around track for longer distance with husband.       PT Short Term Goals - 04/27/19 2352      PT SHORT TERM GOAL #1   Title  Timed Up & Go without device <15seconds with no balance losses.    Baseline  NOT MET 04/27/2019  TUG no AD  15.89sec with balance loss 180* turn    Time  4    Period  Weeks    Status  Not Met    Target Date  04/29/19      PT SHORT TERM GOAL #2   Title  Pt will ambulate on varied surfaces including up/down ramp and curb with RW mod I for improved community mobility.    Baseline  MET per 11/25 note    Time  4    Period  Weeks    Status  Achieved    Target Date  04/29/19        PT Long Term Goals - 03/30/19 2026      PT LONG TERM GOAL #1   Title  Patient verbalizes & demonstrates understanding of ongoing HEP including community fitness.    Time  8    Period  Weeks    Status  On-going    Target Date  05/29/19      PT LONG TERM GOAL #2   Title  Pt will increase Berg score from 39/56 to >45/56 for improved balance and decreased fall risk.    Time  8    Period  Weeks    Status  New    Target Date  05/29/19      PT LONG TERM GOAL #3   Title  Pt will ambulate >400' with LRAD versus no device for improved gait in home and short community distances.    Time  8    Period  Weeks    Status  New    Target Date  05/29/19            Plan - 05/11/19 1801    Clinical Impression Statement  Pt continues to need supervision/CGA with use of SPC. Unsteady on nonlevel surfaces. Pt was able to step up on step better mimicking curb after practice in // bars.    Personal Factors and Comorbidities  Age;Comorbidity 3+;Fitness;Past/Current Experience;Time since onset of injury/illness/exacerbation    Comorbidities  Bil CVAs, DM, HTN, Malnutrition    Examination-Activity Limitations  Carry;Locomotion Level;Reach Overhead;Squat;Stairs;Stand;Transfers    Examination-Participation Restrictions  Community Activity    Stability/Clinical Decision Making  Evolving/Moderate complexity    Rehab Potential  Good    PT Frequency  1x / week    PT Duration  8 weeks  PT Treatment/Interventions  ADLs/Self Care Home Management;DME Instruction;Gait training;Stair training;Functional mobility training;Therapeutic  activities;Therapeutic exercise;Balance training;Neuromuscular re-education;Patient/family education;Vestibular    PT Next Visit Plan  Continue with gait with SPC for community including ramps/curgs and without AD for household including carrying cup or plate to progress balance, standing balance activities working more on ankle strategy.    Consulted and Agree with Plan of Care  Patient       Patient will benefit from skilled therapeutic intervention in order to improve the following deficits and impairments:  Abnormal gait, Cardiopulmonary status limiting activity, Decreased activity tolerance, Decreased balance, Decreased endurance, Decreased knowledge of use of DME, Decreased mobility, Decreased range of motion, Decreased strength, Dizziness, Postural dysfunction, Pain  Visit Diagnosis: Other abnormalities of gait and mobility  Muscle weakness     Problem List Patient Active Problem List   Diagnosis Date Noted  . Seizure prophylaxis--probable seizure 04/12/2018  . Sacral decubitus ulcer, stage IV ([place]) 04/12/2018  . Acute blood loss anemia   . Impulsive   . Hypokalemia   . ETOH abuse   . Dysphagia, post-stroke   . Benign essential HTN   . Acute deep vein thrombosis (DVT) of distal vein of both lower extremities (HCC)   . Sinus tachycardia   . Esophageal perforation   . Perforation esophagus   . Sepsis with acute renal failure ([place])   . Acute deep vein thrombosis (DVT) of both peroneal veins ([place])   . Leukocytosis   . Sepsis due to Pseudomonas species ([place])   . Cerebral embolism with cerebral infarction 03/09/2018  . Malnutrition of moderate degree 03/08/2018  . Empyema ([place]) 03/04/2018  . Pressure injury of skin 03/04/2018  . Acute respiratory failure with hypercapnia ([place]) 03/04/2018  . Diarrhea 02/26/2018  . Pancytopenia ([place]) 02/19/2018  . Hypotension 02/19/2018  . Severe protein-calorie malnutrition ([place]) 02/17/2018  . Colitis 02/16/2018  . Weakness 02/16/2018  .  Nausea vomiting and diarrhea 02/16/2018  . Diabetes ([place]) 02/16/2018  . Essential hypertension 02/16/2018  . Anemia 02/16/2018    Electa Sniff, PT, DPT, NCS 05/11/2019, 6:03 PM  [facility] [address] [place], [place], [address] Phone: [phone]   Fax:  [phone]  Name: Shamell E Avila MRN: [id number] Date of Birth: 1950/10/31

## 2019-05-18 ENCOUNTER — Other Ambulatory Visit: Payer: Self-pay

## 2019-05-18 ENCOUNTER — Ambulatory Visit: Payer: Medicare Other

## 2019-05-18 DIAGNOSIS — M6281 Muscle weakness (generalized): Secondary | ICD-10-CM | POA: Diagnosis not present

## 2019-05-18 DIAGNOSIS — R2689 Other abnormalities of gait and mobility: Secondary | ICD-10-CM

## 2019-05-18 NOTE — Therapy (Signed)
[facility] [address] [place], [place], [address] Phone: [phone]   Fax:  [phone]  Physical Therapy Treatment  Patient Details  Name: Maria Avila MRN: [id number] Date of Birth: 1950-06-09 Referring Provider (PT): Jacobo Forest, MD   Encounter Date: 05/18/2019  PT End of Session - 05/18/19 1232    Visit Number  16    Number of Visits  20    Date for PT Re-Evaluation  96/28/36   cert 90 days, poc 60 days   Authorization Type  [employer] Medicare  $30 co-pay PT & OT ea. so $60 for both    Authorization Time Period  OOP $3600  $2190.16 met  VL:MN    PT Start Time  1230    PT Stop Time  1312    PT Time Calculation (min)  42 min    Equipment Utilized During Treatment  Gait belt    Activity Tolerance  Patient tolerated treatment well;Patient limited by fatigue    Behavior During Therapy  [facility] for tasks assessed/performed;Flat affect       Past Medical History:  Diagnosis Date  . Colitis   . Diabetes mellitus without complication ([place])   . Hypertension   . Malnutrition ([place])   . Stroke [facility])     Past Surgical History:  Procedure Laterality Date  . BIOPSY  02/26/2018   Procedure: BIOPSY;  Surgeon: Wilford Corner, MD;  Location: [place];  Service: Endoscopy;;  . COLONOSCOPY WITH PROPOFOL N/A 02/26/2018   Procedure: COLONOSCOPY WITH PROPOFOL;  Surgeon: Wilford Corner, MD;  Location: [place];  Service: Endoscopy;  Laterality: N/A;  . ESOPHAGOGASTRODUODENOSCOPY (EGD) WITH PROPOFOL N/A 02/26/2018   Procedure: ESOPHAGOGASTRODUODENOSCOPY (EGD) WITH PROPOFOL;  Surgeon: Wilford Corner, MD;  Location: [place];  Service: Endoscopy;  Laterality: N/A;  . NO PAST SURGERIES     UNCERTAIN OF NAME OF SURGERY    There were no vitals filed for this visit.  Subjective Assessment - 05/18/19 1232    Subjective  Pt reports she has been doing well. Has been working on some walker down hall without AD.     Patient is accompained by:  Family member   husband   Pertinent History  Bil CVAs, DM, HTN, Malnutrition    Patient Stated Goals  To walk without walker.    Currently in Pain?  No/denies    Pain Onset  More than a month ago                       [facility] Adult PT Treatment/Exercise - 05/18/19 1234      Ambulation/Gait   Ambulation/Gait  Yes    Ambulation/Gait Assistance  5: Supervision;4: Min guard    Ambulation/Gait Assistance Details  Pt carrying cup with water in it. Pt had some instability with trying to carry cup at same time needing occasional CGA for safety    Ambulation Distance (Feet)  230 Feet    Assistive device  None    Gait Pattern  Step-through pattern;Decreased step length - right;Decreased step length - left    Ambulation Surface  Level;Indoor    Gait Comments  At end of session pt ambulated another 61' with SPC. PT noted patient using SPC on right side that is a little weaker. Pt tried on left but felt better using on right. Pt was able to get some left arm swing with gait and steps slightly longer than without AD.      Neuro  Re-ed    Neuro Re-ed Details   Dynamic gait activites: gait weaving around 3 stones carrying cup of water x 3 laps, attempted reciprocal steps over cones without UE support but increased difficulty needing CGA/min assist. In //bars: reciprocal steps over 3 stones with 1 UE support x 3 laps, then performed without UE suppoort CGA but multiple lob with having to touch bars. Alternating toe taps on cone with fingertip support x 10. SLS with other foot on stepping stone 30 sec x 2 each side with occasional need for UE support. Standing on rockerboard positioned ant/posterior maintaining level 30 sec x 2 then with arm movements up/out to side/across chest x 10 CGA.             PT Education - 05/18/19 1344    Education Details  Pt to continue with current HEP    Person(s) Educated  Patient    Methods  Explanation    Comprehension   Verbalized understanding       PT Short Term Goals - 04/27/19 2352      PT SHORT TERM GOAL #1   Title  Timed Up & Go without device <15seconds with no balance losses.    Baseline  NOT MET 04/27/2019  TUG no AD 15.89sec with balance loss 180* turn    Time  4    Period  Weeks    Status  Not Met    Target Date  04/29/19      PT SHORT TERM GOAL #2   Title  Pt will ambulate on varied surfaces including up/down ramp and curb with RW mod I for improved community mobility.    Baseline  MET per 11/25 note    Time  4    Period  Weeks    Status  Achieved    Target Date  04/29/19        PT Long Term Goals - 03/30/19 2026      PT LONG TERM GOAL #1   Title  Patient verbalizes & demonstrates understanding of ongoing HEP including community fitness.    Time  8    Period  Weeks    Status  On-going    Target Date  05/29/19      PT LONG TERM GOAL #2   Title  Pt will increase Berg score from 39/56 to >45/56 for improved balance and decreased fall risk.    Time  8    Period  Weeks    Status  New    Target Date  05/29/19      PT LONG TERM GOAL #3   Title  Pt will ambulate >400' with LRAD versus no device for improved gait in home and short community distances.    Time  8    Period  Weeks    Status  New    Target Date  05/29/19            Plan - 05/18/19 1344    Clinical Impression Statement  PT added in dual task activities with gait today which further challenged patient with smaller steps and some instability with no AD. Pt unsteady during SLS time of activities and needs UE support at times with dynamic tasks.    Personal Factors and Comorbidities  Age;Comorbidity 3+;Fitness;Past/Current Experience;Time since onset of injury/illness/exacerbation    Comorbidities  Bil CVAs, DM, HTN, Malnutrition    Examination-Activity Limitations  Carry;Locomotion Level;Reach Overhead;Squat;Stairs;Stand;Transfers    Examination-Participation Restrictions  Community Activity     Stability/Clinical Decision  Making  Evolving/Moderate complexity    Rehab Potential  Good    PT Frequency  1x / week    PT Duration  8 weeks    PT Treatment/Interventions  ADLs/Self Care Home Management;DME Instruction;Gait training;Stair training;Functional mobility training;Therapeutic activities;Therapeutic exercise;Balance training;Neuromuscular re-education;Patient/family education;Vestibular    PT Next Visit Plan  Continue with gait with SPC for community including ramps/curgs and without AD for household including carrying cup or plate to progress balance, standing balance activities working more on ankle strategy.    Consulted and Agree with Plan of Care  Patient       Patient will benefit from skilled therapeutic intervention in order to improve the following deficits and impairments:  Abnormal gait, Cardiopulmonary status limiting activity, Decreased activity tolerance, Decreased balance, Decreased endurance, Decreased knowledge of use of DME, Decreased mobility, Decreased range of motion, Decreased strength, Dizziness, Postural dysfunction, Pain  Visit Diagnosis: Other abnormalities of gait and mobility  Muscle weakness     Problem List Patient Active Problem List   Diagnosis Date Noted  . Seizure prophylaxis--probable seizure 04/12/2018  . Sacral decubitus ulcer, stage IV ([place]) 04/12/2018  . Acute blood loss anemia   . Impulsive   . Hypokalemia   . ETOH abuse   . Dysphagia, post-stroke   . Benign essential HTN   . Acute deep vein thrombosis (DVT) of distal vein of both lower extremities (H[facility])   . Sinus tachycardia   . Esophageal perforation   . Perforation esophagus   . Sepsis with acute renal failure ([place])   . Acute deep vein thrombosis (DVT) of both peroneal veins ([place])   . Leukocytosis   . Sepsis due to Pseudomonas species ([place])   . Cerebral embolism with cerebral infarction 03/09/2018  . Malnutrition of moderate degree 03/08/2018  . Empyema ([place]) 03/04/2018   . Pressure injury of skin 03/04/2018  . Acute respiratory failure with hypercapnia ([place]) 03/04/2018  . Diarrhea 02/26/2018  . Pancytopenia ([place]) 02/19/2018  . Hypotension 02/19/2018  . Severe protein-calorie malnutrition ([place]) 02/17/2018  . Colitis 02/16/2018  . Weakness 02/16/2018  . Nausea vomiting and diarrhea 02/16/2018  . Diabetes ([place]) 02/16/2018  . Essential hypertension 02/16/2018  . Anemia 02/16/2018    Electa Sniff, PT, DPT, NCS 05/18/2019, 1:47 PM  [facility] [address] [place], [place], [address] Phone: [phone]   Fax:  [phone]  Name: Maria Avila MRN: [id number] Date of Birth: January 28, 1951

## 2019-05-25 ENCOUNTER — Ambulatory Visit: Payer: Medicare Other

## 2019-05-25 ENCOUNTER — Other Ambulatory Visit: Payer: Self-pay

## 2019-05-25 DIAGNOSIS — R2689 Other abnormalities of gait and mobility: Secondary | ICD-10-CM | POA: Diagnosis not present

## 2019-05-25 DIAGNOSIS — M6281 Muscle weakness (generalized): Secondary | ICD-10-CM

## 2019-05-25 NOTE — Therapy (Signed)
[facility] [address] [place], [place], [address] Phone: [phone]   Fax:  [phone]  Physical Therapy Treatment  Patient Details  Name: Maria Avila MRN: [id number] Date of Birth: 1951-04-09 Referring Provider (PT): Jacobo Forest, MD   Encounter Date: 05/25/2019  PT End of Session - 05/25/19 1229    Visit Number  17    Number of Visits  20    Date for PT Re-Evaluation  55/73/22   cert 90 days, poc 60 days   Authorization Type  UHC Medicare  $30 co-pay PT & OT ea. so $60 for both    Authorization Time Period  OOP $3600  $2190.16 met  VL:MN    PT Start Time  1231    PT Stop Time  1313    PT Time Calculation (min)  42 min    Equipment Utilized During Treatment  Gait belt    Activity Tolerance  Patient tolerated treatment well;Patient limited by fatigue    Behavior During Therapy  [facility] for tasks assessed/performed;Flat affect       Past Medical History:  Diagnosis Date  . Colitis   . Diabetes mellitus without complication ([facility])   . Hypertension   . Malnutrition ([place])   . Stroke [facility])     Past Surgical History:  Procedure Laterality Date  . BIOPSY  02/26/2018   Procedure: BIOPSY;  Surgeon: Wilford Corner, MD;  Location: [place];  Service: Endoscopy;;  . COLONOSCOPY WITH PROPOFOL N/A 02/26/2018   Procedure: COLONOSCOPY WITH PROPOFOL;  Surgeon: Wilford Corner, MD;  Location: [place];  Service: Endoscopy;  Laterality: N/A;  . ESOPHAGOGASTRODUODENOSCOPY (EGD) WITH PROPOFOL N/A 02/26/2018   Procedure: ESOPHAGOGASTRODUODENOSCOPY (EGD) WITH PROPOFOL;  Surgeon: Wilford Corner, MD;  Location: [place];  Service: Endoscopy;  Laterality: N/A;  . NO PAST SURGERIES     UNCERTAIN OF NAME OF SURGERY    There were no vitals filed for this visit.  Subjective Assessment - 05/25/19 1232    Subjective  Pt reports she has been doing well.    Patient is accompained by:  Family member   husband    Pertinent History  Bil CVAs, DM, HTN, Malnutrition    Patient Stated Goals  To walk without walker.    Currently in Pain?  No/denies    Pain Onset  More than a month ago                       [facility] Adult PT Treatment/Exercise - 05/25/19 1232      Ambulation/Gait   Ambulation/Gait  Yes    Ambulation/Gait Assistance  5: Supervision    Ambulation/Gait Assistance Details  Pt was given verbal cues to increase her step length.    Ambulation Distance (Feet)  345 Feet    Assistive device  Straight cane    Gait Pattern  Step-through pattern    Ambulation Surface  Level;Indoor    Ramp  5: Supervision;4: Min assist    Ramp Details (indicate cue type and reason)  Performed x 3 with SPC. Verbal cues to lean forward some when going up and back some when going down.    Gait Comments  Pt ambulated weaving in and out of 4 cones with SPC CGA. 1 episode of LOB requiring min assist when starting. Gait with SPC with stop/go on command CGA. Pt relied more on cane when going to stop.      Standardized Balance Assessment   Standardized Balance  Assessment  Timed Up and Go Test;Berg Balance Test      Berg Balance Test   Sit to Stand  Able to stand  independently using hands    Standing Unsupported  Able to stand safely 2 minutes    Sitting with Back Unsupported but Feet Supported on Floor or Stool  Able to sit safely and securely 2 minutes    Stand to Sit  Sits safely with minimal use of hands    Transfers  Able to transfer safely, definite need of hands    Standing Unsupported with Eyes Closed  Able to stand 10 seconds with supervision    Standing Ubsupported with Feet Together  Able to place feet together independently and stand 1 minute safely    From Standing, Reach Forward with Outstretched Arm  Can reach confidently >25 cm (10")    From Standing Position, Pick up Object from Floor  Able to pick up shoe safely and easily    From Standing Position, Turn to Look Behind Over each Shoulder   Turn sideways only but maintains balance    Turn 360 Degrees  Needs close supervision or verbal cueing    Standing Unsupported, Alternately Place Feet on Step/Stool  Able to stand independently and safely and complete 8 steps in 20 seconds    Standing Unsupported, One Foot in Front  Able to plae foot ahead of the other independently and hold 30 seconds    Standing on One Leg  Tries to lift leg/unable to hold 3 seconds but remains standing independently    Total Score  44      Timed Up and Go Test   TUG  Normal TUG;Manual TUG   without AD   Normal TUG (seconds)  18.5   average of 19 and 18 sec   Manual TUG (seconds)  23   carrying cup of water            PT Education - 05/25/19 1429    Education Details  Pt instructed to work on walking with cane in home more with husband with her for safety.    Person(s) Educated  Patient;Spouse    Methods  Explanation    Comprehension  Verbalized understanding       PT Short Term Goals - 05/25/19 1234      PT SHORT TERM GOAL #1   Title  Timed Up & Go without device <15seconds with no balance losses.    Baseline  NOT MET 04/27/2019  TUG no AD 15.89sec with balance loss 180* turn    Time  4    Period  Weeks    Status  Not Met    Target Date  04/29/19      PT SHORT TERM GOAL #2   Title  Pt will ambulate on varied surfaces including up/down ramp and curb with RW mod I for improved community mobility.    Baseline  MET per 11/25 note    Time  4    Period  Weeks    Status  Achieved    Target Date  04/29/19        PT Long Term Goals - 05/25/19 1250      PT LONG TERM GOAL #1   Title  Patient verbalizes & demonstrates understanding of ongoing HEP including community fitness.    Baseline  Pt has been doing PT prescribed exercises at home 2x/week and going to [facility] 2-3x/week    Time  8    Period  Weeks    Status  Achieved      PT LONG TERM GOAL #2   Title  Pt will increase Berg score from 39/56 to >45/56 for improved balance and  decreased fall risk.    Baseline  44/56 on 05/25/19    Time  8    Period  Weeks    Status  On-going      PT LONG TERM GOAL #3   Title  Pt will ambulate >400' with LRAD versus no device for improved gait in home and short community distances.    Time  8    Period  Weeks    Status  On-going            Plan - 05/25/19 1431    Clinical Impression Statement  PT reassessed TUG and Berg today. Both score still indicating fall risk. Pt less steady when walking without AD relying on PT for safety. Showed improved confidence with SPC today on level surfaces. Husband present and PT discussed working on cane at home with husband supervising. Pt continues to benefit from skilled PT to work on balance and gait safety.    Personal Factors and Comorbidities  Age;Comorbidity 3+;Fitness;Past/Current Experience;Time since onset of injury/illness/exacerbation    Comorbidities  Bil CVAs, DM, HTN, Malnutrition    Examination-Activity Limitations  Carry;Locomotion Level;Reach Overhead;Squat;Stairs;Stand;Transfers    Examination-Participation Restrictions  Community Activity    Stability/Clinical Decision Making  Evolving/Moderate complexity    Rehab Potential  Good    PT Frequency  1x / week    PT Duration  8 weeks    PT Treatment/Interventions  ADLs/Self Care Home Management;DME Instruction;Gait training;Stair training;Functional mobility training;Therapeutic activities;Therapeutic exercise;Balance training;Neuromuscular re-education;Patient/family education;Vestibular    PT Next Visit Plan  Continue with gait with SPC for community including ramps/curbs and without AD for household including carrying cup or plate to progress balance, standing balance activities working more on ankle strategy.    Consulted and Agree with Plan of Care  Patient       Patient will benefit from skilled therapeutic intervention in order to improve the following deficits and impairments:  Abnormal gait, Cardiopulmonary status  limiting activity, Decreased activity tolerance, Decreased balance, Decreased endurance, Decreased knowledge of use of DME, Decreased mobility, Decreased range of motion, Decreased strength, Dizziness, Postural dysfunction, Pain  Visit Diagnosis: Other abnormalities of gait and mobility  Muscle weakness     Problem List Patient Active Problem List   Diagnosis Date Noted  . Seizure prophylaxis--probable seizure 04/12/2018  . Sacral decubitus ulcer, stage IV ([place]) 04/12/2018  . Acute blood loss anemia   . Impulsive   . Hypokalemia   . ETOH abuse   . Dysphagia, post-stroke   . Benign essential HTN   . Acute deep vein thrombosis (DVT) of distal vein of both lower extremities (H[facility])   . Sinus tachycardia   . Esophageal perforation   . Perforation esophagus   . Sepsis with acute renal failure ([place])   . Acute deep vein thrombosis (DVT) of both peroneal veins ([facility])   . Leukocytosis   . Sepsis due to Pseudomonas species ([place])   . Cerebral embolism with cerebral infarction 03/09/2018  . Malnutrition of moderate degree 03/08/2018  . Empyema ([place]) 03/04/2018  . Pressure injury of skin 03/04/2018  . Acute respiratory failure with hypercapnia ([place]) 03/04/2018  . Diarrhea 02/26/2018  . Pancytopenia ([place]) 02/19/2018  . Hypotension 02/19/2018  . Severe protein-calorie malnutrition ([place]) 02/17/2018  . Colitis 02/16/2018  . Weakness 02/16/2018  .  Nausea vomiting and diarrhea 02/16/2018  . Diabetes ([place]) 02/16/2018  . Essential hypertension 02/16/2018  . Anemia 02/16/2018    Electa Sniff, PT, DPT, NCS 05/25/2019, 2:34 PM  [facility] [address] [place], [place], [address] Phone: [phone]   Fax:  [phone]  Name: Maria Avila MRN: [id number] Date of Birth: 01-16-1951

## 2019-05-28 ENCOUNTER — Other Ambulatory Visit: Payer: Self-pay | Admitting: Neurology

## 2019-06-01 ENCOUNTER — Ambulatory Visit: Payer: Medicare Other

## 2019-06-01 ENCOUNTER — Other Ambulatory Visit: Payer: Self-pay

## 2019-06-01 DIAGNOSIS — R2689 Other abnormalities of gait and mobility: Secondary | ICD-10-CM | POA: Diagnosis not present

## 2019-06-01 DIAGNOSIS — M6281 Muscle weakness (generalized): Secondary | ICD-10-CM | POA: Diagnosis not present

## 2019-06-01 NOTE — Therapy (Signed)
Maria Avila Phone: [phone]   Fax:  [phone]  Physical Therapy Treatment  Patient Details  Name: Maria Avila MRN: [id number] Date of Birth: 1950/09/16 Referring Provider (PT): Jacobo Forest, MD   Encounter Date: 06/01/2019  PT End of Session - 06/01/19 1230    Visit Number  18    Number of Visits  20    Date for PT Re-Evaluation  22/02/54   cert 90 days, poc 60 days   Authorization Type  [employer] Medicare  $30 co-pay PT & OT ea. so $60 for both    Authorization Time Period  OOP $3600  $2190.16 met  VL:MN    PT Start Time  1228    PT Stop Time  1311    PT Time Calculation (min)  43 min    Equipment Utilized During Treatment  Gait belt    Activity Tolerance  Patient tolerated treatment well;Patient limited by fatigue    Behavior During Therapy  [facility] for tasks assessed/performed;Flat affect       Past Medical History:  Diagnosis Date  . Colitis   . Diabetes mellitus without complication ([place])   . Hypertension   . Malnutrition ([place])   . Stroke [facility])     Past Surgical History:  Procedure Laterality Date  . BIOPSY  02/26/2018   Procedure: BIOPSY;  Surgeon: Wilford Corner, MD;  Location: [place];  Service: Endoscopy;;  . COLONOSCOPY WITH PROPOFOL N/A 02/26/2018   Procedure: COLONOSCOPY WITH PROPOFOL;  Surgeon: Wilford Corner, MD;  Location: [place];  Service: Endoscopy;  Laterality: N/A;  . ESOPHAGOGASTRODUODENOSCOPY (EGD) WITH PROPOFOL N/A 02/26/2018   Procedure: ESOPHAGOGASTRODUODENOSCOPY (EGD) WITH PROPOFOL;  Surgeon: Wilford Corner, MD;  Location: [place];  Service: Endoscopy;  Laterality: N/A;  . NO PAST SURGERIES     UNCERTAIN OF NAME OF SURGERY    There were no vitals filed for this visit.  Subjective Assessment - 06/01/19 1229    Subjective  Pt reports that she has been working with cane some around house with husband.    Patient is  accompained by:  Family member   husband   Pertinent History  Bil CVAs, DM, HTN, Malnutrition    Patient Stated Goals  To walk without walker.    Currently in Pain?  No/denies    Pain Onset  More than a month ago                       [facility] Adult PT Treatment/Exercise - 06/01/19 1230      Ambulation/Gait   Ambulation/Gait  Yes    Ambulation/Gait Assistance  5: Supervision;4: Min guard    Ambulation/Gait Assistance Details  Pt was given cues to try to increase step length some. As fatigued noted increased sway and 1 episode of right foot catching needing CGA/min assist to regain. Pt reports that legs just get tired.    Ambulation Distance (Feet)  230 Feet    Assistive device  Straight cane    Gait Pattern  Step-through pattern;Decreased step length - right;Decreased step length - left    Ambulation Surface  Level;Indoor    Gait Comments  Pt ambulated another 20' with SPC at end of session with focus on larger steps CGA/supervision.      Neuro Re-ed    Neuro Re-ed Details   Dynamic gait activities: gait with SPC 40' x 2 with head turns on command in various directions,  stop/go on command 40' x 2. CGA for safety. Pt has decreased step length and cadence with activities. Pt ambulated weaving in and out of 4 cones then over red mat x 2 laps with SPC and CGA. Walking over blue mat with SPC 6' x 4. On blue mat: marching in place 10 x 2 with SPC and CGA, standing with feet together x 30 sec eyes open and x 30 sec eyes closed, tandem stance x 30 sec each position. Initial instability but improved as went on CGA, marching again without and UE support x 10. On blue mat by // bar: side stepping 6' x 6 with fingertip support, marching walk forwards and then backwards gait 6' x 6 without UE support CGA. On level ground with SPC gait 10' marching forward and then backwards gait 10' x 4 CGA.             PT Education - 06/01/19 1426    Education Details  Pt to continue walking with  SPC in home with husband    Person(s) Educated  Patient    Methods  Explanation    Comprehension  Verbalized understanding       PT Short Term Goals - 05/25/19 1234      PT SHORT TERM GOAL #1   Title  Timed Up & Go without device <15seconds with no balance losses.    Baseline  NOT MET 04/27/2019  TUG no AD 15.89sec with balance loss 180* turn    Time  4    Period  Weeks    Status  Not Met    Target Date  04/29/19      PT SHORT TERM GOAL #2   Title  Pt will ambulate on varied surfaces including up/down ramp and curb with RW mod I for improved community mobility.    Baseline  MET per 11/25 note    Time  4    Period  Weeks    Status  Achieved    Target Date  04/29/19        PT Long Term Goals - 05/25/19 1250      PT LONG TERM GOAL #1   Title  Patient verbalizes & demonstrates understanding of ongoing HEP including community fitness.    Baseline  Pt has been doing PT prescribed exercises at home 2x/week and going to [facility] 2-3x/week    Time  8    Period  Weeks    Status  Achieved      PT LONG TERM GOAL #2   Title  Pt will increase Berg score from 39/56 to >45/56 for improved balance and decreased fall risk.    Baseline  44/56 on 05/25/19    Time  8    Period  Weeks    Status  On-going      PT LONG TERM GOAL #3   Title  Pt will ambulate >400' with LRAD versus no device for improved gait in home and short community distances.    Time  8    Period  Weeks    Status  On-going            Plan - 06/01/19 1426    Clinical Impression Statement  Pt was able to demonstrate increased step length with SPC compared to prior visits. Pt continues to require less assistance with [facility] with practice.    Personal Factors and Comorbidities  Age;Comorbidity 3+;Fitness;Past/Current Experience;Time since onset of injury/illness/exacerbation    Comorbidities  Bil CVAs, DM, HTN, Malnutrition  Examination-Activity Limitations  Carry;Locomotion Level;Reach  Overhead;Squat;Stairs;Stand;Transfers    Examination-Participation Restrictions  Community Activity    Stability/Clinical Decision Making  Evolving/Moderate complexity    Rehab Potential  Good    PT Frequency  1x / week    PT Duration  8 weeks    PT Treatment/Interventions  ADLs/Self Care Home Management;DME Instruction;Gait training;Stair training;Functional mobility training;Therapeutic activities;Therapeutic exercise;Balance training;Neuromuscular re-education;Patient/family education;Vestibular    PT Next Visit Plan  Continue with gait with SPC for community including ramps/curbs and without AD for household including carrying cup or plate to progress balance, standing balance activities working more on ankle strategy.    Consulted and Agree with Plan of Care  Patient       Patient will benefit from skilled therapeutic intervention in order to improve the following deficits and impairments:  Abnormal gait, Cardiopulmonary status limiting activity, Decreased activity tolerance, Decreased balance, Decreased endurance, Decreased knowledge of use of DME, Decreased mobility, Decreased range of motion, Decreased strength, Dizziness, Postural dysfunction, Pain  Visit Diagnosis: Other abnormalities of gait and mobility  Muscle weakness     Problem List Patient Active Problem List   Diagnosis Date Noted  . Seizure prophylaxis--probable seizure 04/12/2018  . Sacral decubitus ulcer, stage IV ([place]) 04/12/2018  . Acute blood loss anemia   . Impulsive   . Hypokalemia   . ETOH abuse   . Dysphagia, post-stroke   . Benign essential HTN   . Acute deep vein thrombosis (DVT) of distal vein of both lower extremities ([facility])   . Sinus tachycardia   . Esophageal perforation   . Perforation esophagus   . Sepsis with acute renal failure ([place])   . Acute deep vein thrombosis (DVT) of both peroneal veins ([place])   . Leukocytosis   . Sepsis due to Pseudomonas species ([place])   . Cerebral embolism with  cerebral infarction 03/09/2018  . Malnutrition of moderate degree 03/08/2018  . Empyema ([place]) 03/04/2018  . Pressure injury of skin 03/04/2018  . Acute respiratory failure with hypercapnia ([place]) 03/04/2018  . Diarrhea 02/26/2018  . Pancytopenia ([place]) 02/19/2018  . Hypotension 02/19/2018  . Severe protein-calorie malnutrition ([place]) 02/17/2018  . Colitis 02/16/2018  . Weakness 02/16/2018  . Nausea vomiting and diarrhea 02/16/2018  . Diabetes ([place]) 02/16/2018  . Essential hypertension 02/16/2018  . Anemia 02/16/2018    Electa Sniff, PT, DPT, NCS 06/01/2019, 2:28 PM  [facility] [address] [place], [place], [address] Phone: [phone]   Fax:  [phone]  Name: Maria Avila MRN: [id number] Date of Birth: 1951/04/19

## 2019-06-09 ENCOUNTER — Ambulatory Visit: Payer: Medicare Other | Admitting: Adult Health

## 2019-06-09 ENCOUNTER — Other Ambulatory Visit: Payer: Self-pay

## 2019-06-09 ENCOUNTER — Encounter: Payer: Self-pay | Admitting: Adult Health

## 2019-06-09 VITALS — BP 148/84 | HR 61 | Temp 97.3°F | Ht 62.0 in | Wt 180.0 lb

## 2019-06-09 DIAGNOSIS — R269 Unspecified abnormalities of gait and mobility: Secondary | ICD-10-CM | POA: Diagnosis not present

## 2019-06-09 DIAGNOSIS — I82552 Chronic embolism and thrombosis of left peroneal vein: Secondary | ICD-10-CM | POA: Diagnosis not present

## 2019-06-09 DIAGNOSIS — I10 Essential (primary) hypertension: Secondary | ICD-10-CM | POA: Diagnosis not present

## 2019-06-09 DIAGNOSIS — I639 Cerebral infarction, unspecified: Secondary | ICD-10-CM | POA: Diagnosis not present

## 2019-06-09 DIAGNOSIS — R569 Unspecified convulsions: Secondary | ICD-10-CM

## 2019-06-09 NOTE — Progress Notes (Deleted)
[facility] [address] [place]. [place] [address] [phone]       OFFICE FOLLOW UP NOTE  Ms. Airyana E Martinique Date of Birth:  1951-02-02 Medical Record Number:  [id number]   Referring MD: Rosalin Hawking Reason for Referral: Strokes  Chief Complaint  Patient presents with  . Follow-up    RM9. w/ husband. Occasional headaches     HPI:   Update 06/09/2019: Ms. Martinique is a 69 year old female who is being seen today for stroke follow-up.  Residual deficits ***.  Continues on Eliquis without bleeding or bruising.  Blood pressure today 148/84.  Continues on Keppra 500 mg twice daily without any seizure activity.    Update 12/07/2018: Ms. Martinique is a 69 year old female who is being seen today for multiple embolic infarcts in [id number] with residual left hemiparesis and balance difficulty.  She is accompanied today by her husband.  She continues to experience balance difficulties and mild residual left hemiparesis but overall feels as though she has been recovering with improvement since prior visit.  She has recently completed home health PT/OT and plans on starting outpatient PT/OT.  She is awaiting at this time for appointments to be scheduled.  Continues to use RW without recent falls.  She has continued on Eliquis for prior finding of DVT despite recommendation of 27-month duration.  Denies bleeding or bruising with continuation of Eliquis.  She continues on Keppra which she is tolerating well without recurrent seizure activity.  Blood pressure 137/83. She was having monitored at home by therapy - typically 120s.  No further concerns at this time.  Denies new or worsening stroke/TIA symptoms.  Initial visit 06/15/2018  PS: Ms. Martinique is a 69 year old African-American lady seen today for initial office consultation visit.  She is accompanied by her husband.  History is obtained from them and review of electronic medical records.  I personally reviewed imaging films.  Ms. Martinique has a  past medical history of malnutrition, hypertension, diabetes, colitis she apparently presented in septic shock from skilled nursing facility with respiratory failure and chest CT showing free air and fluid.  She had recently undergone endoscopy and had concern for esophageal rupture which may have caused empyema.  Initial CT scan of the head on admission did not show an acute stroke.  She was subsequently extubated and noticed as not moving the right side and having left gaze preference.  NIH stroke scale was 20. Repeat CT scan showed subacute infarct in left parietal lobe.  Neurology was consulted and subsequently MRI scan of the brain was obtained which showed small left parietal, left MCA/ACA watershed, right parietal and pontine punctate infarcts which were felt to be of embolic etiology.  CT angiogram of the brain and neck did not show any significant large vessel stenosis or occlusion.  Interestingly the patient previously had an MRI scan of the brain on 02/16/2018 which did not show an infarct but follow-up MRI on 03/10/2018 showed above-mentioned infarcts.  Lower extremity venous Dopplers showed bilateral deep vein thrombosis.  Transcranial Doppler bubble study was negative for PFO.  TEE could not be done because of fear of questionable esophageal perforation.  2D echo showed normal ejection fraction.  LDL cholesterol was 62 mg percent and hemoglobin A1c was 5.1.  Patient was initially started on IV heparin for few days and subsequently switched to Eliquis.  She was transferred for rehab.  Patient has made gradual recovery.  She is currently living at home with her husband.  She is  just finished home physical and occupational therapy.  She still needs 1 person assist to get up and is able to walk with a walker with her husband holding onto her.  The family were recommended outpatient therapy but they are thinking about it because of cost of co-pay.  The patient states her speech is much better.  Her mind is  also improving though occasionally she gets confused.  She has obtained good improvement in the strength on the left side though she still has mild weakness in her legs.  She is tolerating Eliquis well without bleeding or bruising.  She remains on Keppra which is tolerating well.  She had 2 EEGs on the hospitalization both of which had done shown generalized slowing but she was started on Keppra for questionable seizures.  She has not had any further breakthrough seizures  ROS:   14 system review of systems is positive for weakness, balance difficulties and all systems negative  PMH:  Past Medical History:  Diagnosis Date  . Colitis   . Diabetes mellitus without complication ([place])   . Hypertension   . Malnutrition ([place])   . Stroke [facility])     Social History:  Social History   Socioeconomic History  . Marital status: Married    Spouse name: Not on file  . Number of children: Not on file  . Years of education: Not on file  . Highest education level: Not on file  Occupational History  . Not on file  Tobacco Use  . Smoking status: Former Research scientist (life sciences)  . Smokeless tobacco: Never Used  Substance and Sexual Activity  . Alcohol use: Not Currently  . Drug use: Not Currently  . Sexual activity: Not on file  Other Topics Concern  . Not on file  Social History Narrative  . Not on file   Social Determinants of Health   Financial Resource Strain:   . Difficulty of Paying Living Expenses: Not on file  Food Insecurity:   . Worried About Charity fundraiser in the Last Year: Not on file  . Ran Out of Food in the Last Year: Not on file  Transportation Needs:   . Lack of Transportation (Medical): Not on file  . Lack of Transportation (Non-Medical): Not on file  Physical Activity:   . Days of Exercise per Week: Not on file  . Minutes of Exercise per Session: Not on file  Stress:   . Feeling of Stress : Not on file  Social Connections:   . Frequency of Communication with Friends and Family:  Not on file  . Frequency of Social Gatherings with Friends and Family: Not on file  . Attends Religious Services: Not on file  . Active Member of Clubs or Organizations: Not on file  . Attends Archivist Meetings: Not on file  . Marital Status: Not on file  Intimate Partner Violence:   . Fear of Current or Ex-Partner: Not on file  . Emotionally Abused: Not on file  . Physically Abused: Not on file  . Sexually Abused: Not on file    Medications:   Current Outpatient Medications on File Prior to Visit  Medication Sig Dispense Refill  . acetaminophen (TYLENOL) 325 MG tablet Take 1-2 tablets (325-650 mg total) by mouth every 4 (four) hours as needed for mild pain.    Marland Kitchen aspirin EC 81 MG tablet Take 81 mg by mouth daily. OTC    . [employer] VITAMIN C 500 MG tablet TAKE 1 TABLET BY  MOUTH TWICE A DAY 100 tablet 0  . levETIRAcetam (KEPPRA) 500 MG tablet TAKE 1 TABLET BY MOUTH TWICE A DAY 180 tablet 1  . lidocaine (LIDODERM) 5 % Place 1 patch onto the skin daily. Remove & Discard patch within 12 hours or as directed by MD 10 patch 0  . Multiple Vitamin (MULTIVITAMIN) capsule Take 1 capsule by mouth daily.    . pantoprazole (PROTONIX) 40 MG tablet Take 40 mg by mouth 2 (two) times daily.    Marland Kitchen thiamine (VITAMIN B-1) 100 MG tablet TAKE 1 TABLET BY MOUTH EVERY DAY 30 tablet 0   No current facility-administered medications on file prior to visit.    Allergies:   Allergies  Allergen Reactions  . Crestor [Rosuvastatin Calcium] Swelling and Other (See Comments)    Swelling of legs and muscle weakness  . Zocor [Simvastatin] Swelling and Other (See Comments)    Swelling of legs and muscle weakness    Physical Exam General: Frail middle-aged African-American lady seated, in no evident distress Head: head normocephalic and atraumatic.   Neck: supple with no carotid or supraclavicular bruits Cardiovascular: regular rate and rhythm, no murmurs Musculoskeletal: no deformity Skin:  no  rash/petichiae  Vascular:  Normal pulses all extremities  Neurologic Exam Mental Status: Awake and fully alert. Oriented to place and time. Recent and remote memory intact. Attention span, concentration and fund of knowledge appropriate. Mood and affect appropriate.  Cranial Nerves: Pupils equal, briskly reactive to light. Extraocular movements full without nystagmus. Visual fields full to confrontation. Hearing intact. Facial sensation intact.  Mild left lower facial weakness, tongue, palate moves normally and symmetrically.  Motor: Normal bulk and tone. Mild left grip weakness.  Diminished fine finger movements on the left.  Orbits right over left upper extremity.  Mild weakness of hip flexors and ankle dorsiflexors L > R.  Sensory.: intact to touch , pinprick , position and vibratory sensation.  Coordination: Rapid alternating movements normal in all extremities. Finger-to-nose and heel-to-shin performed accurately bilat    Gait: Stance is slightly hunched.  Stands from seated position without assistance and mild difficulty.  Ambulates with rolling walker without difficulty Reflexes: 2+ brisk and symmetric. Toes downgoing.      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  Evidence of bilateral lower extremity DVT during admission with initiation of Eliquis.  Residual mild left hemiparesis and gait difficulties.  She does endorse overall improvement since prior visit without any new or worsening stroke/TIA symptoms.     PLAN: -Continue Eliquis 5 mg twice daily at this time but will reevaluate prior DVTs with lower extremity ultrasound and if resolved, recommend discontinuing Eliquis and start aspirin 81 mg daily for secondary stroke prevention -Continue to follow with PCP for HTN management -Continue Keppra 500 mg twice daily for seizure prophylaxis -Neuro rehab clinic number provided to  patient and husband to call to schedule initial evaluation with outpatient PT/OT for ongoing deficits -maintain strict control of hypertension with blood pressure goal below 130/90, diabetes with hemoglobin A1c goal below 6.5% and lipids with LDL cholesterol goal below 70 mg/dL.  Follow-up in 6 months or call earlier if needed  Greater than 50% time during this 25-minute visit was spent on counseling and coordination of care about her multiple embolic strokes and answering questions regarding residual deficits and ongoing management  Frann Rider, [facility]  [facility] [address] [place], [place] [address]  Phone [phone] Fax [phone] Note: This document was prepared with digital dictation and possible smart phrase technology. Any transcriptional errors that result from this process are unintentional.

## 2019-06-09 NOTE — Progress Notes (Addendum)
[facility] [address] [place]. [place] [address] [phone]       OFFICE FOLLOW UP NOTE  Maria Avila Date of Birth:  [birth year] Medical Record Number:  [id number]   Referring MD: Rosalin Hawking Reason for Referral: Strokes  Chief Complaint  Patient presents with   Follow-up    RM9. w/ husband. Occasional headaches     HPI:   Update 06/09/2019: Maria Avila is a 69 year old female who is being seen today for stroke follow-up accompanied by her husband. Residual deficits include gait impairment with stiffened left lower leg but endorses ongoing improvement. Continued use of rolling walker at this appointment but does endorse occasional use of cane. She has continued to work with outpatient PT. She mentions occasional quick sharp head zaps that were occurring prior to the stroke. No new changes since stroke. She follows up with her PCP Dr Shelia Media in March and will have lipid panel collected. Repeated lower extrem ultrasound 12/16/2018 which showed resolution of acute R DVT previously found in 03/2018 but continued chronic L DVT of left peroneal veins. It was recommended to d/c eliquis and initiate aspirin for secondary stroke prevention.  Denies bleeding or bruising from Asprin. She has continued on aspirin without bleeding or bruising. Blood pressure today 148/84. Continues on Keppra 500 mg twice daily tolerating without side effects and without any seizure activity. Denies new or worsening stroke/TIA symptoms.   Update 12/07/2018: Maria Avila is a 69 year old female who is being seen today for multiple embolic infarcts in [id number] with residual left hemiparesis and balance difficulty.  She is accompanied today by her husband.  She continues to experience balance difficulties and mild residual left hemiparesis but overall feels as though she has been recovering with improvement since prior visit.  She has recently completed home health PT/OT and plans on starting outpatient  PT/OT.  She is awaiting at this time for appointments to be scheduled.  Continues to use RW without recent falls.  She has continued on Eliquis for prior finding of DVT despite recommendation of 60-month duration.  Denies bleeding or bruising with continuation of Eliquis.  She continues on Keppra which she is tolerating well without recurrent seizure activity.  Blood pressure 137/83. She was having monitored at home by therapy - typically 120s.  No further concerns at this time.  Denies new or worsening stroke/TIA symptoms.  Initial visit 06/15/2018  PS: Maria Avila is a 69 year old African-American lady seen today for initial office consultation visit.  She is accompanied by her husband.  History is obtained from them and review of electronic medical records.  I personally reviewed imaging films.  Maria Avila has a past medical history of malnutrition, hypertension, diabetes, colitis she apparently presented in septic shock from skilled nursing facility with respiratory failure and chest CT showing free air and fluid.  She had recently undergone endoscopy and had concern for esophageal rupture which may have caused empyema.  Initial CT scan of the head on admission did not show an acute stroke.  She was subsequently extubated and noticed as not moving the right side and having left gaze preference.  NIH stroke scale was 20. Repeat CT scan showed subacute infarct in left parietal lobe.  Neurology was consulted and subsequently MRI scan of the brain was obtained which showed small left parietal, left MCA/ACA watershed, right parietal and pontine punctate infarcts which were felt to be of embolic etiology.  CT angiogram of the brain and neck did not show any  significant large vessel stenosis or occlusion.  Interestingly the patient previously had an MRI scan of the brain on 02/16/2018 which did not show an infarct but follow-up MRI on 03/10/2018 showed above-mentioned infarcts.  Lower extremity venous Dopplers showed  bilateral deep vein thrombosis.  Transcranial Doppler bubble study was negative for PFO.  TEE could not be done because of fear of questionable esophageal perforation.  2D echo showed normal ejection fraction.  LDL cholesterol was 62 mg percent and hemoglobin A1c was 5.1.  Patient was initially started on IV heparin for few days and subsequently switched to Eliquis.  She was transferred for rehab.  Patient has made gradual recovery.  She is currently living at home with her husband.  She is just finished home physical and occupational therapy.  She still needs 1 person assist to get up and is able to walk with a walker with her husband holding onto her.  The family were recommended outpatient therapy but they are thinking about it because of cost of co-pay.  The patient states her speech is much better.  Her mind is also improving though occasionally she gets confused.  She has obtained good improvement in the strength on the left side though she still has mild weakness in her legs.  She is tolerating Eliquis well without bleeding or bruising.  She remains on Keppra which is tolerating well.  She had 2 EEGs on the hospitalization both of which had done shown generalized slowing but she was started on Keppra for questionable seizures.  She has not had any further breakthrough seizures  ROS:   14 system review of systems is positive for weakness, balance difficulties and all systems negative  PMH:  Past Medical History:  Diagnosis Date   Colitis    Diabetes mellitus without complication ([place])    Hypertension    Malnutrition ([place])    Stroke ([place])     Social History:  Social History   Socioeconomic History   Marital status: Married    Spouse name: Not on file   Number of children: Not on file   Years of education: Not on file   Highest education level: Not on file  Occupational History   Not on file  Tobacco Use   Smoking status: Former Smoker   Smokeless tobacco: Never Used  Substance and  Sexual Activity   Alcohol use: Not Currently   Drug use: Not Currently   Sexual activity: Not on file  Other Topics Concern   Not on file  Social History Narrative   Not on file   Social Determinants of Health   Financial Resource Strain:    Difficulty of Paying Living Expenses: Not on file  Food Insecurity:    Worried About Charity fundraiser in the Last Year: Not on file   [employer] of Food in the Last Year: Not on file  Transportation Needs:    Lack of Transportation (Medical): Not on file   Lack of Transportation (Non-Medical): Not on file  Physical Activity:    Days of Exercise per Week: Not on file   Minutes of Exercise per Session: Not on file  Stress:    Feeling of Stress : Not on file  Social Connections:    Frequency of Communication with Friends and Family: Not on file   Frequency of Social Gatherings with Friends and Family: Not on file   Attends Religious Services: Not on file   Active Member of Clubs or Organizations: Not on file  Attends Archivist Meetings: Not on file   Marital Status: Not on file  Intimate Partner Violence:    Fear of Current or Ex-Partner: Not on file   Emotionally Abused: Not on file   Physically Abused: Not on file   Sexually Abused: Not on file    Medications:   Current Outpatient Medications on File Prior to Visit  Medication Sig Dispense Refill   acetaminophen (TYLENOL) 325 MG tablet Take 1-2 tablets (325-650 mg total) by mouth every 4 (four) hours as needed for mild pain.     aspirin EC 81 MG tablet Take 81 mg by mouth daily. OTC     CVS VITAMIN C 500 MG tablet TAKE 1 TABLET BY MOUTH TWICE A DAY 100 tablet 0   levETIRAcetam (KEPPRA) 500 MG tablet TAKE 1 TABLET BY MOUTH TWICE A DAY 180 tablet 1   lidocaine (LIDODERM) 5 % Place 1 patch onto the skin daily. Remove & Discard patch within 12 hours or as directed by MD 10 patch 0   Multiple Vitamin (MULTIVITAMIN) capsule Take 1 capsule by mouth daily.     pantoprazole  (PROTONIX) 40 MG tablet Take 40 mg by mouth 2 (two) times daily.     thiamine (VITAMIN B-1) 100 MG tablet TAKE 1 TABLET BY MOUTH EVERY DAY 30 tablet 0   No current facility-administered medications on file prior to visit.    Allergies:   Allergies  Allergen Reactions   Crestor [Rosuvastatin Calcium] Swelling and Other (See Comments)    Swelling of legs and muscle weakness   Zocor [Simvastatin] Swelling and Other (See Comments)    Swelling of legs and muscle weakness    Physical Exam  Today's Vitals   06/09/19 1044  BP: (!) 148/84  Pulse: 61  Temp: (!) 97.3 F (36.3 C)  Weight: 180 lb (81.6 kg)  Height: 5\' 2"  (1.575 m)   Body mass index is 32.92 kg/m.  General: Pleasant middle-aged African-American lady seated, in no evident distress Head: head normocephalic and atraumatic.   Neck: supple with no carotid or supraclavicular bruits Cardiovascular: regular rate and rhythm, no murmurs Musculoskeletal: no deformity Skin:  no rash/petichiae  Vascular:  Normal pulses all extremities  Neurologic Exam Mental Status: Awake and fully alert. Normal speech and language. Oriented to place and time. Recent and remote memory intact. Attention span, concentration and fund of knowledge appropriate. Mood and affect appropriate.  Cranial Nerves: Pupils equal, briskly reactive to light. Extraocular movements full without nystagmus. Visual fields full to confrontation. Hearing intact. Facial sensation intact.  Facial  tongue, palate moves normally and symmetrically.  Motor: Normal bulk and tone. Normal strength observed in all extremities  Sensory.: intact to touch , pinprick , position and vibratory sensation.  Coordination: Rapid alternating movements normal in all extremities. Finger-to-nose and heel-to-shin performed accurately  Gait: Stance is slightly hunched.  Stands from seated position without assistance and minimal dfficulty.  Ambulates with rolling walker with mild stiffened left leg  but normal stride length and no evidence of imbalance.  Reflexes: 2+ brisk and symmetric. Toes downgoing.      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  Evidence of bilateral lower extremity DVT during admission with initiation of Eliquis. Chronic left distal DVT and Right leg DVT has resolved on repeat US in 12/2018. Stopped eliquis and is now taking Asprin. Residual mild gait impairment with left lower leg stiffness with ambulation but  no evidence of weakness during strength testing.  She does endorse overall improvement since prior visit without any new or worsening stroke/TIA symptoms.    PLAN: 1. Bilateral Embolic Strokes- Continue aspirin 81 mg daily for secondary stroke prevention. Maintain strict control of hypertension with blood pressure goal below 130/90, diabetes with hemoglobin A1c goal below 6.5% and cholesterol with LDL cholesterol (bad cholesterol) goal below 70 mg/dL.  I also advised the patient to eat a healthy diet with plenty of whole grains, cereals, fruits and vegetables, exercise regularly with at least 30 minutes of continuous activity daily and maintain ideal body weight. 2. HTN-  Continue current regimen and Continue to follow with PCP for HTN management. 3. Possible Seizures, post stroke - Continue Keppra 500 mg twice daily for seizure prophylaxis. Will consider repeat EEG at follow up visit 4. Residual Deficits- gait impairment with left lower leg stiffness. Continue working with outpatient therapy for going hopeful improvement.  5. DVT: Evidence of acute right LE DVT resolved with finding of chronic left distal LE DVT.  No indication for anticoagulation at this time and has been switched to aspirin 81 mg daily after prior visit   Follow-up in 6 months or call earlier if needed   Greater than 50% time during this 20-minute visit was spent on counseling and  coordination of care about her multiple embolic strokes and answering questions regarding residual deficit and ongoing management. All questions and concerns were answered to both patient and her husband's satisfaction.    Frann Rider, AGNP-BC  [facility] [address] [place],  [address]  Phone [phone] Fax [phone] Note: This document was prepared with digital dictation and possible smart phrase technology. Any transcriptional errors that result from this process are unintentional.

## 2019-06-09 NOTE — Patient Instructions (Signed)
Continue aspirin 81 mg daily  for secondary stroke prevention  Continue to follow up with PCP regarding cholesterol and blood pressure management   Continue keppra 500mg  twice daily for seizure prophylaxis   Continue to monitor blood pressure at home  Maintain strict control of hypertension with blood pressure goal below 130/90, diabetes with hemoglobin A1c goal below 6.5% and cholesterol with LDL cholesterol (bad cholesterol) goal below 70 mg/dL. I also advised the patient to eat a healthy diet with plenty of whole grains, cereals, fruits and vegetables, exercise regularly and maintain ideal body weight.  Followup in the future with me in 6 months or call earlier if needed       Thank you for coming to see [place] at [facility]. I hope we have been able to provide you high quality care today.  You may receive a patient satisfaction survey over the next few weeks. We would appreciate your feedback and comments so that we may continue to improve ourselves and the health of our patients.

## 2019-06-09 NOTE — Progress Notes (Signed)
I agree with the above plan 

## 2019-06-17 ENCOUNTER — Other Ambulatory Visit: Payer: Self-pay

## 2019-06-17 ENCOUNTER — Ambulatory Visit: Payer: Medicare Other | Attending: Physical Medicine & Rehabilitation

## 2019-06-17 DIAGNOSIS — R2689 Other abnormalities of gait and mobility: Secondary | ICD-10-CM | POA: Diagnosis not present

## 2019-06-17 DIAGNOSIS — M6281 Muscle weakness (generalized): Secondary | ICD-10-CM | POA: Diagnosis not present

## 2019-06-17 NOTE — Therapy (Signed)
[facility] [address] [place], [place], [address] Phone: [phone]   Fax:  [phone]  Physical Therapy Treatment  Patient Details  Name: Maria Avila MRN: [id number] Date of Birth: 06/20/1950 Referring Provider (PT): Jacobo Forest, MD   Encounter Date: 06/17/2019  PT End of Session - 06/17/19 1229    Visit Number  19    Number of Visits  20    Date for PT Re-Evaluation  69/62/95   cert 90 days, poc 60 days   Authorization Type  [employer] Medicare  $30 co-pay PT & OT ea. so $60 for both    Authorization Time Period  OOP $3600  $2190.16 met  VL:MN    PT Start Time  1229    PT Stop Time  1313    PT Time Calculation (min)  44 min    Equipment Utilized During Treatment  Gait belt    Activity Tolerance  Patient tolerated treatment well;Patient limited by fatigue    Behavior During Therapy  [facility] for tasks assessed/performed;Flat affect       Past Medical History:  Diagnosis Date  . Colitis   . Diabetes mellitus without complication ([place])   . Hypertension   . Malnutrition ([place])   . Stroke [facility])     Past Surgical History:  Procedure Laterality Date  . BIOPSY  02/26/2018   Procedure: BIOPSY;  Surgeon: Wilford Corner, MD;  Location: [facility];  Service: Endoscopy;;  . COLONOSCOPY WITH PROPOFOL N/A 02/26/2018   Procedure: COLONOSCOPY WITH PROPOFOL;  Surgeon: Wilford Corner, MD;  Location: [facility];  Service: Endoscopy;  Laterality: N/A;  . ESOPHAGOGASTRODUODENOSCOPY (EGD) WITH PROPOFOL N/A 02/26/2018   Procedure: ESOPHAGOGASTRODUODENOSCOPY (EGD) WITH PROPOFOL;  Surgeon: Wilford Corner, MD;  Location: [facility];  Service: Endoscopy;  Laterality: N/A;  . NO PAST SURGERIES     UNCERTAIN OF NAME OF SURGERY    There were no vitals filed for this visit.  Subjective Assessment - 06/17/19 1229    Subjective  Pt reports she is doing well.    Patient is accompained by:  Family member   husband   Pertinent History  Bil CVAs, DM, HTN, Malnutrition    Patient Stated Goals  To walk without walker.    Currently in Pain?  No/denies    Pain Onset  More than a month ago                       [facility] Adult PT Treatment/Exercise - 06/17/19 1230      Ambulation/Gait   Ambulation/Gait  Yes    Ambulation/Gait Assistance  5: Supervision;4: Min guard    Ambulation/Gait Assistance Details  Pt was cued to keep head up and did well seeing obstacles ahead of her.    Ambulation Distance (Feet)  345 Feet    Assistive device  Straight cane    Gait Pattern  Step-through pattern;Decreased step length - right;Decreased step length - left;Wide base of support    Ambulation Surface  Level;Indoor    Ramp  5: Supervision    Ramp Details (indicate cue type and reason)  up/down ramp x 3 with SPC    Gait Comments  HR=58 after gait, BP=148/88. Pt ambulated another 24' with SPC at end of session with focus on large steps CGA/supervision. Pt able to increase cadence this time.      Neuro Re-ed    Neuro Re-ed Details   Pt performed dynamic gait activities: gait with SPC  with head turns up/down or left/right on command 115' then 115' with stop/go on command. HR=64 after. Standing on ramp facing uphill with SPC x 30 sec eyes open then 30 sec eyes closed, then standing with raising cane overhead holding with both hands x 10. CGA for all. Standing on blue soft foam beam 30 sec eyes open and 30 sec eyes closed. Increased sway with eyes closed losing balance posterior and anterior a couple times needing min assist for safety. Then added head turns left/right 10 x 2, then sidestepping on beam 4' x 4 with occasional UE support. Standing on black beam 30 sec x 2 with CGA. Tandem stance 30 sec x 2 each side with occasional UE support CGA for safety.             PT Education - 06/17/19 1327    Education Details  Pt to continue with current HEP. Did discuss possible d/c next visit.    Person(s) Educated   Patient    Methods  Explanation    Comprehension  Verbalized understanding       PT Short Term Goals - 05/25/19 1234      PT SHORT TERM GOAL #1   Title  Timed Up & Go without device <15seconds with no balance losses.    Baseline  NOT MET 04/27/2019  TUG no AD 15.89sec with balance loss 180* turn    Time  4    Period  Weeks    Status  Not Met    Target Date  04/29/19      PT SHORT TERM GOAL #2   Title  Pt will ambulate on varied surfaces including up/down ramp and curb with RW mod I for improved community mobility.    Baseline  MET per 11/25 note    Time  4    Period  Weeks    Status  Achieved    Target Date  04/29/19        PT Long Term Goals - 05/25/19 1250      PT LONG TERM GOAL #1   Title  Patient verbalizes & demonstrates understanding of ongoing HEP including community fitness.    Baseline  Pt has been doing PT prescribed exercises at home 2x/week and going to [facility] 2-3x/week    Time  8    Period  Weeks    Status  Achieved      PT LONG TERM GOAL #2   Title  Pt will increase Berg score from 39/56 to >45/56 for improved balance and decreased fall risk.    Baseline  44/56 on 05/25/19    Time  8    Period  Weeks    Status  On-going      PT LONG TERM GOAL #3   Title  Pt will ambulate >400' with LRAD versus no device for improved gait in home and short community distances.    Time  8    Period  Weeks    Status  On-going            Plan - 06/17/19 1327    Clinical Impression Statement  Pt was able to increase gait speed with SPC as session progressed with supervision/CGA for safety. Pt continues to be challenged with balance activities incorporating more ankle strategy but is able to maintain balance longer without support.    Personal Factors and Comorbidities  Age;Comorbidity 3+;Fitness;Past/Current Experience;Time since onset of injury/illness/exacerbation    Comorbidities  Bil CVAs, DM, HTN, Malnutrition  Examination-Activity Limitations  Carry;Locomotion  Level;Reach Overhead;Squat;Stairs;Stand;Transfers    Examination-Participation Restrictions  Community Activity    Stability/Clinical Decision Making  Evolving/Moderate complexity    Rehab Potential  Good    PT Frequency  1x / week    PT Duration  8 weeks    PT Treatment/Interventions  ADLs/Self Care Home Management;DME Instruction;Gait training;Stair training;Functional mobility training;Therapeutic activities;Therapeutic exercise;Balance training;Neuromuscular re-education;Patient/family education;Vestibular    PT Next Visit Plan  Recheck Berg and other goals for possible d/c next visit.    Consulted and Agree with Plan of Care  Patient       Patient will benefit from skilled therapeutic intervention in order to improve the following deficits and impairments:  Abnormal gait, Cardiopulmonary status limiting activity, Decreased activity tolerance, Decreased balance, Decreased endurance, Decreased knowledge of use of DME, Decreased mobility, Decreased range of motion, Decreased strength, Dizziness, Postural dysfunction, Pain  Visit Diagnosis: Other abnormalities of gait and mobility  Muscle weakness     Problem List Patient Active Problem List   Diagnosis Date Noted  . Seizure prophylaxis--probable seizure 04/12/2018  . Sacral decubitus ulcer, stage IV ([place]) 04/12/2018  . Acute blood loss anemia   . Impulsive   . Hypokalemia   . ETOH abuse   . Dysphagia, post-stroke   . Benign essential HTN   . Acute deep vein thrombosis (DVT) of distal vein of both lower extremities (HCC)   . Sinus tachycardia   . Esophageal perforation   . Perforation esophagus   . Sepsis with acute renal failure ([place])   . Acute deep vein thrombosis (DVT) of both peroneal veins ([place])   . Leukocytosis   . Sepsis due to Pseudomonas species ([place])   . Cerebral embolism with cerebral infarction 03/09/2018  . Malnutrition of moderate degree 03/08/2018  . Empyema ([place]) 03/04/2018  . Pressure injury of skin  03/04/2018  . Acute respiratory failure with hypercapnia ([place]) 03/04/2018  . Diarrhea 02/26/2018  . Pancytopenia ([place]) 02/19/2018  . Hypotension 02/19/2018  . Severe protein-calorie malnutrition ([place]) 02/17/2018  . Colitis 02/16/2018  . Weakness 02/16/2018  . Nausea vomiting and diarrhea 02/16/2018  . Diabetes ([place]) 02/16/2018  . Essential hypertension 02/16/2018  . Anemia 02/16/2018    Electa Sniff, PT,DPT, NCS 06/17/2019, 1:32 PM  [facility] [address] [place], [place], [address] Phone: [phone]   Fax:  [phone]  Name: Maria Avila MRN: [id number] Date of Birth: 08-15-1950

## 2019-06-24 ENCOUNTER — Other Ambulatory Visit: Payer: Self-pay

## 2019-06-24 ENCOUNTER — Ambulatory Visit: Payer: Medicare Other

## 2019-06-24 DIAGNOSIS — M6281 Muscle weakness (generalized): Secondary | ICD-10-CM | POA: Diagnosis not present

## 2019-06-24 DIAGNOSIS — R2689 Other abnormalities of gait and mobility: Secondary | ICD-10-CM

## 2019-06-24 NOTE — Therapy (Signed)
[facility] [address] [place], [place], [address] Phone: [phone]   Fax:  [phone]  Physical Therapy Treatment/Discharge summary/10th visit  Patient Details  Name: Maria Avila MRN: [id number] Date of Birth: Nov 19, 1950 Referring Provider (PT): Jacobo Forest, MD    Progress Note  Reporting period 03/23/19 to 06/24/19  See Note below for Objective Data and Assessment of Progress/Goals   PHYSICAL THERAPY DISCHARGE SUMMARY  Visits from Start of Care: 20  Current functional level related to goals / functional outcomes: See clinical impression for updates.   Remaining deficits: Balance deficits      Education / Equipment: HEP  Plan: Patient agrees to discharge.  Patient goals were not met. Patient is being discharged due to lack of progress.  ??Plateau in further progress.???          Encounter Date: 06/24/2019  PT End of Session - 06/24/19 1232    Visit Number  20    Number of Visits  20    Date for PT Re-Evaluation  70/96/28   cert 90 days, poc 60 days   Authorization Type  [employer] Medicare  $30 co-pay PT & OT ea. so $60 for both    Authorization Time Period  OOP $3600  $2190.16 met  VL:MN    PT Start Time  1230    PT Stop Time  1300   finished early as d/c visit   PT Time Calculation (min)  30 min    Equipment Utilized During Treatment  Gait belt    Activity Tolerance  Patient tolerated treatment well    Behavior During Therapy  WFL for tasks assessed/performed;Flat affect       Past Medical History:  Diagnosis Date  . Colitis   . Diabetes mellitus without complication ([place])   . Hypertension   . Malnutrition ([place])   . Stroke [facility])     Past Surgical History:  Procedure Laterality Date  . BIOPSY  02/26/2018   Procedure: BIOPSY;  Surgeon: Wilford Corner, MD;  Location: [facility];  Service: Endoscopy;;  . COLONOSCOPY WITH PROPOFOL N/A 02/26/2018   Procedure: COLONOSCOPY WITH  PROPOFOL;  Surgeon: Wilford Corner, MD;  Location: [facility];  Service: Endoscopy;  Laterality: N/A;  . ESOPHAGOGASTRODUODENOSCOPY (EGD) WITH PROPOFOL N/A 02/26/2018   Procedure: ESOPHAGOGASTRODUODENOSCOPY (EGD) WITH PROPOFOL;  Surgeon: Wilford Corner, MD;  Location: [facility];  Service: Endoscopy;  Laterality: N/A;  . NO PAST SURGERIES     UNCERTAIN OF NAME OF SURGERY    There were no vitals filed for this visit.  Subjective Assessment - 06/24/19 1232    Subjective  Pt reports she is doing well.    Patient is accompained by:  Family member   husband   Pertinent History  Bil CVAs, DM, HTN, Malnutrition    Patient Stated Goals  To walk without walker.    Currently in Pain?  No/denies    Pain Onset  More than a month ago                       [facility] Adult PT Treatment/Exercise - 06/24/19 1233      Transfers   Transfers  Sit to Stand;Stand to Sit    Sit to Stand  6: Modified independent (Device/Increase time)    Stand to Sit  6: Modified independent (Device/Increase time)      Ambulation/Gait   Ambulation/Gait  Yes    Ambulation/Gait Assistance  5: Supervision    Ambulation/Gait  Assistance Details  Pt reports that right leg tires as she goes on    Ambulation Distance (Feet)  345 Feet    Assistive device  Straight cane    Gait Pattern  Step-through pattern;Decreased step length - right;Decreased step length - left    Ambulation Surface  Level;Indoor      Standardized Balance Assessment   Standardized Balance Assessment  Berg Balance Test;Timed Up and Go Test      Berg Balance Test   Sit to Stand  Able to stand  independently using hands    Standing Unsupported  Able to stand safely 2 minutes    Sitting with Back Unsupported but Feet Supported on Floor or Stool  Able to sit safely and securely 2 minutes    Stand to Sit  Sits safely with minimal use of hands    Transfers  Able to transfer safely, minor use of hands    Standing Unsupported with Eyes  Closed  Able to stand 10 seconds safely    Standing Ubsupported with Feet Together  Able to place feet together independently and stand for 1 minute with supervision    From Standing, Reach Forward with Outstretched Arm  Can reach confidently >25 cm (10")    From Standing Position, Pick up Object from Floor  Able to pick up shoe safely and easily    From Standing Position, Turn to Look Behind Over each Shoulder  Turn sideways only but maintains balance    Turn 360 Degrees  Needs close supervision or verbal cueing    Standing Unsupported, Alternately Place Feet on Step/Stool  Able to stand independently and safely and complete 8 steps in 20 seconds    Standing Unsupported, One Foot in Front  Able to take small step independently and hold 30 seconds    Standing on One Leg  Tries to lift leg/unable to hold 3 seconds but remains standing independently    Total Score  44      Timed Up and Go Test   TUG  Normal TUG    Normal TUG (seconds)  17   17 sec RW, 19.5 sec average with [facility]            PT Education - 06/24/19 1310    Education Details  Pt to continue with current HEP and gym program    Person(s) Educated  Patient;Spouse    Methods  Explanation    Comprehension  Verbalized understanding       PT Short Term Goals - 06/24/19 1313      PT SHORT TERM GOAL #1   Title  Timed Up & Go without device <15seconds with no balance losses.    Baseline  NOT MET 04/27/2019  TUG no AD 15.89sec with balance loss 180* turn, 06/24/19 17 sec with RW, 19.5 sec with SPC    Time  4    Period  Weeks    Status  Not Met    Target Date  04/29/19      PT SHORT TERM GOAL #2   Title  Pt will ambulate on varied surfaces including up/down ramp and curb with RW mod I for improved community mobility.    Baseline  MET per 11/25 note    Time  4    Period  Weeks    Status  Achieved    Target Date  04/29/19        PT Long Term Goals - 06/24/19 1245      PT LONG  TERM GOAL #1   Title  Patient verbalizes  & demonstrates understanding of ongoing HEP including community fitness.    Baseline  Pt has been doing PT prescribed exercises at home 2x/week and going to [facility] 2-3x/week    Time  8    Period  Weeks    Status  Achieved      PT LONG TERM GOAL #2   Title  Pt will increase Berg score from 39/56 to >45/56 for improved balance and decreased fall risk.    Baseline  44/56 on 05/25/19, 44/56 on 06/24/19    Time  8    Period  Weeks    Status  Not Met      PT LONG TERM GOAL #3   Title  Pt will ambulate >400' with LRAD versus no device for improved gait in home and short community distances.    Baseline  Pt has been able to reach this distance with RW mod I but stops around 345' with SPC and needs close SBA. Recommending RW at all times except in home with [facility] if husband with her.    Time  8    Period  Weeks    Status  Partially Met            Plan - 06/24/19 1320    Clinical Impression Statement  Pt's balance goals were reassessed today with no change in Berg or TUG score. Scores still indicate fall risk and walker is safest device at thist time. Pt only using SPC in home when husband with her for safety. Pt has been working on [employer] and going to gym for aerobic activity weekly. PT discharging at this time and patient in agreement.    Personal Factors and Comorbidities  Age;Comorbidity 3+;Fitness;Past/Current Experience;Time since onset of injury/illness/exacerbation    Comorbidities  Bil CVAs, DM, HTN, Malnutrition    Examination-Activity Limitations  Carry;Locomotion Level;Reach Overhead;Squat;Stairs;Stand;Transfers    Examination-Participation Restrictions  Community Activity    Stability/Clinical Decision Making  Evolving/Moderate complexity    Rehab Potential  Good    PT Frequency  1x / week    PT Duration  8 weeks    PT Treatment/Interventions  ADLs/Self Care Home Management;DME Instruction;Gait training;Stair training;Functional mobility training;Therapeutic activities;Therapeutic  exercise;Balance training;Neuromuscular re-education;Patient/family education;Vestibular    PT Next Visit Plan  Discharged today    Consulted and Agree with Plan of Care  Patient       Patient will benefit from skilled therapeutic intervention in order to improve the following deficits and impairments:  Abnormal gait, Cardiopulmonary status limiting activity, Decreased activity tolerance, Decreased balance, Decreased endurance, Decreased knowledge of use of DME, Decreased mobility, Decreased range of motion, Decreased strength, Dizziness, Postural dysfunction, Pain  Visit Diagnosis: Other abnormalities of gait and mobility  Muscle weakness     Problem List Patient Active Problem List   Diagnosis Date Noted  . Seizure prophylaxis--probable seizure 04/12/2018  . Sacral decubitus ulcer, stage IV ([place]) 04/12/2018  . Acute blood loss anemia   . Impulsive   . Hypokalemia   . ETOH abuse   . Dysphagia, post-stroke   . Benign essential HTN   . Acute deep vein thrombosis (DVT) of distal vein of both lower extremities (HCC)   . Sinus tachycardia   . Esophageal perforation   . Perforation esophagus   . Sepsis with acute renal failure ([place])   . Acute deep vein thrombosis (DVT) of both peroneal veins ([place])   . Leukocytosis   . Sepsis due to  Pseudomonas species ([place])   . Cerebral embolism with cerebral infarction 03/09/2018  . Malnutrition of moderate degree 03/08/2018  . Empyema ([place]) 03/04/2018  . Pressure injury of skin 03/04/2018  . Acute respiratory failure with hypercapnia ([place]) 03/04/2018  . Diarrhea 02/26/2018  . Pancytopenia ([place]) 02/19/2018  . Hypotension 02/19/2018  . Severe protein-calorie malnutrition ([place]) 02/17/2018  . Colitis 02/16/2018  . Weakness 02/16/2018  . Nausea vomiting and diarrhea 02/16/2018  . Diabetes ([place]) 02/16/2018  . Essential hypertension 02/16/2018  . Anemia 02/16/2018    Electa Sniff, PT, DPT, NCS 06/24/2019, 1:27 PM  [facility] [address] [place], [place], [address] Phone: [phone]   Fax:  [phone]  Name: Maria Avila MRN: [id number] Date of Birth: 11/07/1950

## 2019-07-01 NOTE — Progress Notes (Signed)
Maria Avila, Maria E. ([id number]) Visit Report for 07/02/2018 Arrival Information Details Patient Name: Date of Service: Maria Avila, Maria E. 07/02/2018 11:00 AM Medical Record Patient Account Number: [id number] Number: Treating RN: Deon Pilling Date of Birth/Sex: 1950-10-20 (69 y.o. Female) Other Clinician: Primary Care Rosha Cocker: Treating Erick Murin/Extender:Maria Avila Gage Referring Annikah Lovins: Weeks in Treatment: 10 Visit Information History Since Last Visit Added or deleted any medications: Yes Patient Arrived: Wheel Chair Any new allergies or adverse reactions: No Arrival Time: 12:17 Had a fall or experienced change in No activities of daily living that may affect Accompanied By: husband risk of falls: Transfer Assistance: None Signs or symptoms of abuse/neglect since last No Patient Identification Verified: Yes visito Secondary Verification Process Completed: Yes Hospitalized since last visit: No Patient Requires Transmission-Based No Implantable device outside of the clinic excluding No Precautions: cellular tissue based products placed in the center Patient Has Alerts: No since last visit: Has Dressing in Place as Prescribed: Yes Pain Present Now: No Electronic Signature(s) Signed: 05/24/2019 3:54:34 PM By: Maye Avila Previous Signature: 07/02/2018 3:49:38 PM Version By: Deon Pilling Entered By: Maye Avila on 05/24/2019 15:54:33 -------------------------------------------------------------------------------- Clinic Level of Care Assessment Details Patient Name: Date of Service: Maria Avila, Maria E. 07/02/2018 11:00 AM Medical Record Patient Account Number: [id number] Number: Treating RN: Baruch Gouty Date of Birth/Sex: [birth year] (69 y.o. Female) Other Clinician: Primary Care Danene Montijo: Treating Maria Avila/Extender:Maria Avila Avila Referring Maria Avila: Suella Grove in Treatment: 10 Clinic Level of Care Assessment Items TOOL 4 Quantity Score []  - Use when only an EandM is  performed on FOLLOW-UP visit 0 ASSESSMENTS - Nursing Assessment / Reassessment X - Reassessment of Co-morbidities (includes updates in patient status) 1 10 X - Reassessment of Adherence to Treatment Plan 1 5 ASSESSMENTS - Wound and Skin Assessment / Reassessment X - Simple Wound Assessment / Reassessment - one wound 1 5 []  - Complex Wound Assessment / Reassessment - multiple wounds 0 []  - Dermatologic / Skin Assessment (not related to wound area) 0 ASSESSMENTS - Focused Assessment []  - Circumferential Edema Measurements - multi extremities 0 []  - Nutritional Assessment / Counseling / Intervention 0 []  - Lower Extremity Assessment (monofilament, tuning fork, pulses) 0 []  - Peripheral Arterial Disease Assessment (using hand held doppler) 0 ASSESSMENTS - Ostomy and/or Continence Assessment and Care []  - Incontinence Assessment and Management 0 []  - Ostomy Care Assessment and Management (repouching, etc.) 0 PROCESS - Coordination of Care X - Simple Patient / Family Education for ongoing care 1 15 []  - Complex (extensive) Patient / Family Education for ongoing care 0 X - Staff obtains Programmer, systems, Records, Test Results / Process Orders 1 10 X - Staff telephones HHA, Nursing Homes / Clarify orders / etc 1 10 []  - Routine Transfer to another Facility (non-emergent condition) 0 []  - Routine Hospital Admission (non-emergent condition) 0 []  - New Admissions / Biomedical engineer / Ordering NPWT, Apligraf, etc. 0 []  - Emergency Hospital Admission (emergent condition) 0 X - Simple Discharge Coordination 1 10 []  - Complex (extensive) Discharge Coordination 0 PROCESS - Special Needs []  - Pediatric / Minor Patient Management 0 []  - Isolation Patient Management 0 []  - Hearing / Language / Visual special needs 0 []  - Assessment of Community assistance (transportation, D/C planning, etc.) 0 []  - Additional assistance / Altered mentation 0 []  - Support Surface(s) Assessment (bed, cushion, seat, etc.)  0 INTERVENTIONS - Wound Cleansing / Measurement X - Simple Wound Cleansing - one wound 1 5 []  - Complex Wound Cleansing - multiple wounds 0 X - Wound Imaging (  photographs - any number of wounds) 1 5 []  - Wound Tracing (instead of photographs) 0 X - Simple Wound Measurement - one wound 1 5 []  - Complex Wound Measurement - multiple wounds 0 INTERVENTIONS - Wound Dressings X - Small Wound Dressing one or multiple wounds 1 10 []  - Medium Wound Dressing one or multiple wounds 0 []  - Large Wound Dressing one or multiple wounds 0 X - Application of Medications - topical 1 5 []  - Application of Medications - injection 0 INTERVENTIONS - Miscellaneous []  - External ear exam 0 []  - Specimen Collection (cultures, biopsies, blood, body fluids, etc.) 0 []  - Specimen(s) / Culture(s) sent or taken to Lab for analysis 0 []  - Patient Transfer (multiple staff / Civil Service fast streamer / Similar devices) 0 []  - Simple Staple / Suture removal (25 or less) 0 []  - Complex Staple / Suture removal (26 or more) 0 []  - Hypo / Hyperglycemic Management (close monitor of Blood Glucose) 0 []  - Ankle / Brachial Index (ABI) - do not check if billed separately 0 X - Vital Signs 1 5 Has the patient been seen at the hospital within the last three years: Yes Total Score: 100 Level Of Care: New/Established - Level 3 Electronic Signature(s) Signed: 07/02/2018 5:13:27 PM By: Baruch Gouty RN, BSN Entered By: Baruch Gouty on 07/02/2018 12:38:51 -------------------------------------------------------------------------------- Lower Extremity Assessment Details Patient Name: Date of Service: Maria Avila, Maria E. 07/02/2018 11:00 AM Medical Record Patient Account Number: [id number] Number: Treating RN: Deon Pilling Date of Birth/Sex: [birth year] (69 y.o. Female) Other Clinician: Primary Care Delbert Vu: Treating Alexi Dorminey/Extender:Maria Avila Gage Referring Ginnifer Creelman: Weeks in Treatment: 10 Electronic Signature(s) Signed: 05/24/2019 3:55:29  PM By: Maye Avila Previous Signature: 07/02/2018 3:49:38 PM Version By: Deon Pilling Entered By: Maye Avila on 05/24/2019 15:55:28 -------------------------------------------------------------------------------- [place] Details Patient Name: Date of Service: Maria Avila, Maria E. 07/02/2018 11:00 AM Medical Record Patient Account Number: [id number] Number: Treating RN: Baruch Gouty Date of Birth/Sex: 18-Jun-1950 (69 y.o. Female) Other Clinician: Primary Care Khamya Topp: Treating Saralyn Willison/Extender:Maria Avila Gage Referring Laurier Jasperson: Weeks in Treatment: 10 Active Inactive Pressure Nursing Diagnoses: Knowledge deficit related to causes and risk factors for pressure ulcer development Knowledge deficit related to management of pressures ulcers Potential for impaired tissue integrity related to pressure, friction, moisture, and shear Goals: Patient/caregiver will verbalize understanding of pressure ulcer management Date Initiated: 06/04/2018 Target Resolution Date: 07/30/2018 Goal Status: Active Interventions: Assess: immobility, friction, shearing, incontinence upon admission and as needed Assess offloading mechanisms upon admission and as needed Notes: Wound/Skin Impairment Nursing Diagnoses: Knowledge deficit related to ulceration/compromised skin integrity Goals: Ulcer/skin breakdown will have a volume reduction of 30% by week 4 Date Initiated: 04/20/2018 Date Inactivated: 05/21/2018 Target Resolution Date: 06/21/2018 Unmet Reason: patient has Goal Status: Unmet not recieved the wound vac we ordered yet Ulcer/skin breakdown will have a volume reduction of 50% by week 8 Date Initiated: 05/21/2018 Target Resolution Date: 07/20/2018 Goal Status: Active Interventions: Assess patient/caregiver ability to obtain necessary supplies Assess patient/caregiver ability to perform ulcer/skin care regimen upon admission and as needed Assess ulceration(s) every  visit Notes: Electronic Signature(s) Signed: 05/24/2019 3:56:03 PM By: Maye Avila Previous Signature: 07/02/2018 5:13:27 PM Version By: Baruch Gouty RN, BSN Entered By: Maye Avila on 05/24/2019 15:56:02 -------------------------------------------------------------------------------- Pain Assessment Details Patient Name: Date of Service: Maria Avila, Maria E. 07/02/2018 11:00 AM Medical Record Patient Account Number: [id number] Number: Treating RN: Deon Pilling Date of Birth/Sex: 06-24-1950 (69 y.o. Female) Other Clinician: Primary Care Darnelle Derrick: Treating Arno Cullers/Extender:Maria Avila Gage Referring Avriana Joo: Weeks in Treatment: 10  Active Problems Location of Pain Severity and Description of Pain Patient Has Paino No Site Locations Rate the pain. Current Pain Level: 0 Pain Management and Medication Current Pain Management: Medication: No Cold Application: No Rest: No Massage: No Activity: No T.AvilaN.S.: No Heat Application: No Leg drop or elevation: No Is the Current Pain Management Adequate: Adequate How does your wound impact your activities of daily livingo Sleep: No Bathing: No Appetite: No Relationship With Others: No Bladder Continence: No Emotions: No Bowel Continence: No Work: No Toileting: No Drive: No Dressing: No Hobbies: No Electronic Signature(s) Signed: 05/24/2019 3:55:17 PM By: Maye Avila Previous Signature: 07/02/2018 3:49:38 PM Version By: Deon Pilling Entered By: Maye Avila on 05/24/2019 15:55:16 -------------------------------------------------------------------------------- Patient/Caregiver Education Details Patient Name: Maria Avila, Allye E. 2/28/2020andnbsp11:00 Date of Service: AM Medical Record [id number] Number: Patient Account Number: Treating RN: Aug 29, 1950 (69 y.o. Baruch Gouty Date of Birth/Gender: Female) Other Clinician: Primary Care Treating Linton Ham Physician: Physician/Extender: Referring  Physician: Suella Grove in Treatment: 10 Education Assessment Education Provided To: Patient Education Topics Provided Pressure: Methods: Explain/Verbal Responses: State content correctly Wound/Skin Impairment: Methods: Explain/Verbal Responses: State content correctly Electronic Signature(s) Signed: 07/02/2018 5:13:27 PM By: Baruch Gouty RN, BSN Entered By: Baruch Gouty on 07/02/2018 12:38:20 -------------------------------------------------------------------------------- Wound Assessment Details Patient Name: Date of Service: Maria Avila, Maria E. 07/02/2018 11:00 AM Medical Record Patient Account Number: [id number] Number: Treating RN: Deon Pilling Date of Birth/Sex: 1950/09/11 (69 y.o. Female) Other Clinician: Primary Care Gustavia Carie: Treating Kasey Ewings/Extender:Maria Avila Avila Referring Wallis Vancott: Weeks in Treatment: 10 Wound Status Wound Number: 1 Primary Pressure Ulcer Etiology: Wound Location: Sacrum Wound Open Wounding Event: Pressure Injury Status: Date Acquired: 04/20/2018 Comorbid Cataracts, Anemia, Deep Vein Thrombosis, Weeks Of Treatment: 10 History: Hypertension, Colitis, Type II Diabetes Clustered Wound: No Photos Photo Uploaded By: Marlou Sa on 07/06/2018 12:15:30 Wound Measurements Length: (cm) 1.6 % Reduction Width: (cm) 1.3 % Reduction Depth: (cm) 0.4 Epitheliali Area: (cm) 1.634 Tunneling: Volume: (cm) 0.653 Underminin Wound Description Classification: Category/Stage IV Foul Odor Wound Margin: Distinct, outline attached Sloug Exudate Amount: Medium Exudate Type: Serosanguineous Exudate Color: red, brown Wound Bed Granulation Amount: Large (67-100%) Granulation Quality: Red, Pink Fasci Necrotic Amount: None Present (0%) Fat L Tendo Muscl Joint Bone Periwound Skin Texture Texture No Abnormalities Noted: No No Abn Callus: No Atroph Crepitus: No Cyanos Excoriation: No Ecchym Induration: No Erythe Rash: No Hemosi Scarring: Yes  Mottle Pallor Moisture Rubor: No Abnormalities Noted: No Dry / Scaly: No Maceration: No Temper Tender Wound Preparation Ulcer Cleansing: Topical Anesthetic Applied: After Cleansing: No h/Fibrino No Exposed Structure a Exposed: No ayer (Subcutaneous Tissue) Exposed: Yes n Exposed: No e Exposed: Yes Necrosis of Muscle: No Exposed: No Exposed: No Color ormalities Noted: No ie Blanche: No is: No osis: No ma: No derin Staining: No d: No : No No Temperature / Pain ature: No Abnormality ness on Palpation: Yes Wound Cleanser Other: 5% lidocaine, in Area: 94.7% in Volume: 99% zation: Large (67-100%) No g: No Electronic Signature(s) Signed: 07/02/2018 3:49:38 PM By: Deon Pilling Entered By: Deon Pilling on 07/02/2018 12:24:47 -------------------------------------------------------------------------------- Vitals Details Patient Name: Date of Service: Maria Avila, Maria E. 07/02/2018 11:00 AM Medical Record Patient Account Number: [id number] Number: Treating RN: Deon Pilling Date of Birth/Sex: 1950/07/23 (69 y.o. Female) Other Clinician: Primary Care Chaselynn Kepple: Treating Alaysha Jefcoat/Extender:Maria Avila Gage Referring Kenai Fluegel: Weeks in Treatment: 10 Vital Signs Time Taken: 12:17 Temperature (F): 98.3 Height (in): 62 Pulse (bpm): 95 Respiratory Rate (breaths/min): 20 Blood Pressure (mmHg): 144/91 Reference Range: 80 - 120 mg / dl Electronic Signature(s) Signed:  05/24/2019 3:55:03 PM By: Maye Avila Previous Signature: 05/24/2019 3:54:51 PM Version By: Maye Avila Previous Signature: 07/02/2018 3:49:38 PM Version By: Deon Pilling Entered By: Maye Avila on 05/24/2019 15:55:03

## 2019-07-20 DIAGNOSIS — I10 Essential (primary) hypertension: Secondary | ICD-10-CM | POA: Diagnosis not present

## 2019-07-20 DIAGNOSIS — E7800 Pure hypercholesterolemia, unspecified: Secondary | ICD-10-CM | POA: Diagnosis not present

## 2019-07-20 DIAGNOSIS — E119 Type 2 diabetes mellitus without complications: Secondary | ICD-10-CM | POA: Diagnosis not present

## 2019-07-26 DIAGNOSIS — E7800 Pure hypercholesterolemia, unspecified: Secondary | ICD-10-CM | POA: Diagnosis not present

## 2019-07-26 DIAGNOSIS — S81812A Laceration without foreign body, left lower leg, initial encounter: Secondary | ICD-10-CM | POA: Diagnosis not present

## 2019-07-26 DIAGNOSIS — R162 Hepatomegaly with splenomegaly, not elsewhere classified: Secondary | ICD-10-CM | POA: Diagnosis not present

## 2019-07-26 DIAGNOSIS — Z0001 Encounter for general adult medical examination with abnormal findings: Secondary | ICD-10-CM | POA: Diagnosis not present

## 2019-07-26 DIAGNOSIS — I10 Essential (primary) hypertension: Secondary | ICD-10-CM | POA: Diagnosis not present

## 2019-07-26 DIAGNOSIS — E118 Type 2 diabetes mellitus with unspecified complications: Secondary | ICD-10-CM | POA: Diagnosis not present

## 2019-08-11 DIAGNOSIS — R162 Hepatomegaly with splenomegaly, not elsewhere classified: Secondary | ICD-10-CM | POA: Diagnosis not present

## 2019-08-11 DIAGNOSIS — K760 Fatty (change of) liver, not elsewhere classified: Secondary | ICD-10-CM | POA: Diagnosis not present

## 2019-08-11 DIAGNOSIS — K8020 Calculus of gallbladder without cholecystitis without obstruction: Secondary | ICD-10-CM | POA: Diagnosis not present

## 2019-08-23 DIAGNOSIS — E7800 Pure hypercholesterolemia, unspecified: Secondary | ICD-10-CM | POA: Diagnosis not present

## 2019-08-23 DIAGNOSIS — I10 Essential (primary) hypertension: Secondary | ICD-10-CM | POA: Diagnosis not present

## 2019-08-25 DIAGNOSIS — E7800 Pure hypercholesterolemia, unspecified: Secondary | ICD-10-CM | POA: Diagnosis not present

## 2019-08-25 DIAGNOSIS — M8589 Other specified disorders of bone density and structure, multiple sites: Secondary | ICD-10-CM | POA: Diagnosis not present

## 2019-08-25 DIAGNOSIS — I639 Cerebral infarction, unspecified: Secondary | ICD-10-CM | POA: Diagnosis not present

## 2019-08-25 DIAGNOSIS — M85852 Other specified disorders of bone density and structure, left thigh: Secondary | ICD-10-CM | POA: Diagnosis not present

## 2019-08-25 DIAGNOSIS — M85851 Other specified disorders of bone density and structure, right thigh: Secondary | ICD-10-CM | POA: Diagnosis not present

## 2019-08-25 DIAGNOSIS — I10 Essential (primary) hypertension: Secondary | ICD-10-CM | POA: Diagnosis not present

## 2019-08-26 DIAGNOSIS — E119 Type 2 diabetes mellitus without complications: Secondary | ICD-10-CM | POA: Diagnosis not present

## 2019-08-26 DIAGNOSIS — H524 Presbyopia: Secondary | ICD-10-CM | POA: Diagnosis not present

## 2019-08-26 DIAGNOSIS — H2513 Age-related nuclear cataract, bilateral: Secondary | ICD-10-CM | POA: Diagnosis not present

## 2019-10-04 DIAGNOSIS — E7800 Pure hypercholesterolemia, unspecified: Secondary | ICD-10-CM | POA: Diagnosis not present

## 2019-10-04 DIAGNOSIS — M85851 Other specified disorders of bone density and structure, right thigh: Secondary | ICD-10-CM | POA: Diagnosis not present

## 2019-10-04 DIAGNOSIS — E119 Type 2 diabetes mellitus without complications: Secondary | ICD-10-CM | POA: Diagnosis not present

## 2019-10-04 DIAGNOSIS — I10 Essential (primary) hypertension: Secondary | ICD-10-CM | POA: Diagnosis not present

## 2019-10-06 DIAGNOSIS — I639 Cerebral infarction, unspecified: Secondary | ICD-10-CM | POA: Diagnosis not present

## 2019-10-06 DIAGNOSIS — I10 Essential (primary) hypertension: Secondary | ICD-10-CM | POA: Diagnosis not present

## 2019-10-06 DIAGNOSIS — E7800 Pure hypercholesterolemia, unspecified: Secondary | ICD-10-CM | POA: Diagnosis not present

## 2019-11-19 ENCOUNTER — Other Ambulatory Visit: Payer: Self-pay | Admitting: Adult Health

## 2019-12-13 ENCOUNTER — Encounter: Payer: Self-pay | Admitting: Adult Health

## 2019-12-13 ENCOUNTER — Other Ambulatory Visit: Payer: Self-pay

## 2019-12-13 ENCOUNTER — Ambulatory Visit: Payer: Medicare Other | Admitting: Adult Health

## 2019-12-13 VITALS — BP 139/77 | HR 61 | Ht 62.0 in | Wt 203.8 lb

## 2019-12-13 DIAGNOSIS — I10 Essential (primary) hypertension: Secondary | ICD-10-CM

## 2019-12-13 DIAGNOSIS — E785 Hyperlipidemia, unspecified: Secondary | ICD-10-CM | POA: Diagnosis not present

## 2019-12-13 DIAGNOSIS — I639 Cerebral infarction, unspecified: Secondary | ICD-10-CM | POA: Diagnosis not present

## 2019-12-13 DIAGNOSIS — R569 Unspecified convulsions: Secondary | ICD-10-CM

## 2019-12-13 NOTE — Progress Notes (Signed)
[facility] [address] [place]. [place] [address] [phone]       OFFICE FOLLOW UP NOTE  Maria Avila Date of Birth:  20-May-1950 Medical Record Number:  [id number]   Referring MD: Rosalin Hawking Reason for Referral: Strokes  Chief Complaint  Patient presents with   Follow-up    6 month f/u. states she has been doing well since last visit.    room 9    with husband      HPI:   Today, 12/13/2019, [name] is being seen for stroke follow-up accompanied by her husband.  Residual deficits of imbalance and gait impairment which has been stable without worsening Use of RW at all times - completed therapy and she plauted  Denies new stroke/TIA symptoms  No recurrent seizure type activity or episode Remains on Keppra 500 mg twice daily without side effects Husband questions ongoing use of AED  Remains on aspirin 81 mg daily without bleeding or bruising.  Remains on Repatha and Zetia for HLD management. Blood pressure today 139/77 - intermittently checks at home which has been stable  No further concerns at this time.    History provided for reference purposes only Update 06/09/2019 JM: [name] is a 69 year old female who is being seen today for stroke follow-up accompanied by her husband. Residual deficits include gait impairment with stiffened left lower leg but endorses ongoing improvement. Continued use of rolling walker at this appointment but does endorse occasional use of cane. She has continued to work with outpatient PT. She mentions occasional quick sharp head zaps that were occurring prior to the stroke. No new changes since stroke. She follows up with her PCP Dr Shelia Media in March and will have lipid panel collected. Repeated lower extrem ultrasound 12/16/2018 which showed resolution of acute R DVT previously found in 03/2018 but continued chronic L DVT of left peroneal veins. It was recommended to d/c eliquis and initiate aspirin for secondary  stroke prevention.  Denies bleeding or bruising from Asprin. She has continued on aspirin without bleeding or bruising. Blood pressure today 148/84. Continues on Keppra 500 mg twice daily tolerating without side effects and without any seizure activity. Denies new or worsening stroke/TIA symptoms.  Update 12/07/2018: [name] is a 69 year old female who is being seen today for multiple embolic infarcts in 71/6967 with residual left hemiparesis and balance difficulty.  She is accompanied today by her husband.  She continues to experience balance difficulties and mild residual left hemiparesis but overall feels as though she has been recovering with improvement since prior visit.  She has recently completed home health PT/OT and plans on starting outpatient PT/OT.  She is awaiting at this time for appointments to be scheduled.  Continues to use RW without recent falls.  She has continued on Eliquis for prior finding of DVT despite recommendation of 54-month duration.  Denies bleeding or bruising with continuation of Eliquis.  She continues on Keppra which she is tolerating well without recurrent seizure activity.  Blood pressure 137/83. She was having monitored at home by therapy - typically 120s.  No further concerns at this time.  Denies new or worsening stroke/TIA symptoms.  Initial visit 06/15/2018  PS: [name] is a 69 year old African-American lady seen today for initial office consultation visit.  She is accompanied by her husband.  History is obtained from them and review of electronic medical records.  I personally reviewed imaging films.  [name] has a past medical history of malnutrition, hypertension,  diabetes, colitis she apparently presented in septic shock from skilled nursing facility with respiratory failure and chest CT showing free air and fluid.  She had recently undergone endoscopy and had concern for esophageal rupture which may have caused empyema.  Initial CT scan of the head on  admission did not show an acute stroke.  She was subsequently extubated and noticed as not moving the right side and having left gaze preference.  NIH stroke scale was 20. Repeat CT scan showed subacute infarct in left parietal lobe.  Neurology was consulted and subsequently MRI scan of the brain was obtained which showed small left parietal, left MCA/ACA watershed, right parietal and pontine punctate infarcts which were felt to be of embolic etiology.  CT angiogram of the brain and neck did not show any significant large vessel stenosis or occlusion.  Interestingly the patient previously had an MRI scan of the brain on 02/16/2018 which did not show an infarct but follow-up MRI on 03/10/2018 showed above-mentioned infarcts.  Lower extremity venous Dopplers showed bilateral deep vein thrombosis.  Transcranial Doppler bubble study was negative for PFO.  TEE could not be done because of fear of questionable esophageal perforation.  2D echo showed normal ejection fraction.  LDL cholesterol was 62 mg percent and hemoglobin A1c was 5.1.  Patient was initially started on IV heparin for few days and subsequently switched to Eliquis.  She was transferred for rehab.  Patient has made gradual recovery.  She is currently living at home with her husband.  She is just finished home physical and occupational therapy.  She still needs 1 person assist to get up and is able to walk with a walker with her husband holding onto her.  The family were recommended outpatient therapy but they are thinking about it because of cost of co-pay.  The patient states her speech is much better.  Her mind is also improving though occasionally she gets confused.  She has obtained good improvement in the strength on the left side though she still has mild weakness in her legs.  She is tolerating Eliquis well without bleeding or bruising.  She remains on Keppra which is tolerating well.  She had 2 EEGs on the hospitalization both of which had done shown  generalized slowing but she was started on Keppra for questionable seizures.  She has not had any further breakthrough seizures    ROS:   14 system review of systems is positive for weakness, balance difficulties and all systems negative  PMH:  Past Medical History:  Diagnosis Date   Colitis    Diabetes mellitus without complication ([place])    Hypertension    Malnutrition ([place])    Stroke ([place])     Social History:  Social History   Socioeconomic History   Marital status: Married    Spouse name: Not on file   Number of children: Not on file   Years of education: Not on file   Highest education level: Not on file  Occupational History   Not on file  Tobacco Use   Smoking status: Former Smoker   Smokeless tobacco: Never Used  Scientific laboratory technician Use: Never used  Substance and Sexual Activity   Alcohol use: Not Currently   Drug use: Not Currently   Sexual activity: Not on file  Other Topics Concern   Not on file  Social History Narrative   Not on file   Social Determinants of Health   Financial Resource Strain:  Difficulty of Paying Living Expenses:   Food Insecurity:    Worried About Charity fundraiser in the Last Year:    Arboriculturist in the Last Year:   Transportation Needs:    Film/video editor (Medical):    Lack of Transportation (Non-Medical):   Physical Activity:    Days of Exercise per Week:    Minutes of Exercise per Session:   Stress:    Feeling of Stress :   Social Connections:    Frequency of Communication with Friends and Family:    Frequency of Social Gatherings with Friends and Family:    Attends Religious Services:    Active Member of Clubs or Organizations:    Attends Music therapist:    Marital Status:   Intimate Partner Violence:    Fear of Current or Ex-Partner:    Emotionally Abused:    Physically Abused:    Sexually Abused:     Medications:   Current Outpatient  Medications on File Prior to Visit  Medication Sig Dispense Refill   acetaminophen (TYLENOL) 325 MG tablet Take 1-2 tablets (325-650 mg total) by mouth every 4 (four) hours as needed for mild pain.     aspirin EC 81 MG tablet Take 81 mg by mouth daily. OTC     benazepril-hydrochlorthiazide (LOTENSIN HCT) 20-25 MG tablet Take 1 tablet by mouth daily.     [employer] VITAMIN C 500 MG tablet TAKE 1 TABLET BY MOUTH TWICE A DAY 100 tablet 0   ezetimibe (ZETIA) 10 MG tablet Take 10 mg by mouth daily.     hydrochlorothiazide (HYDRODIURIL) 12.5 MG tablet Take 12.5 mg by mouth daily.     levETIRAcetam (KEPPRA) 500 MG tablet TAKE 1 TABLET BY MOUTH TWICE A DAY 180 tablet 1   lidocaine (LIDODERM) 5 % Place 1 patch onto the skin daily. Remove & Discard patch within 12 hours or as directed by MD 10 patch 0   Multiple Vitamin (MULTIVITAMIN) capsule Take 1 capsule by mouth daily.     pantoprazole (PROTONIX) 40 MG tablet Take 40 mg by mouth 2 (two) times daily.     REPATHA SURECLICK 734 MG/ML SOAJ [id number] Milligram(s) SUB-Q Every 4 Weeks     thiamine (VITAMIN B-1) 100 MG tablet TAKE 1 TABLET BY MOUTH EVERY DAY 30 tablet 0   No current facility-administered medications on file prior to visit.    Allergies:   Allergies  Allergen Reactions   Crestor [Rosuvastatin Calcium] Swelling and Other (See Comments)    Swelling of legs and muscle weakness   Zocor [Simvastatin] Swelling and Other (See Comments)    Swelling of legs and muscle weakness    Physical Exam  Today's Vitals   12/13/19 1047  BP: 139/77  Pulse: 61  Weight: 203 lb 12.8 oz (92.4 kg)  Height: 5\' 2"  (1.575 m)   Body mass index is 37.28 kg/m.  General: Pleasant middle-aged African-American lady seated, in no evident distress Head: head normocephalic and atraumatic.   Neck: supple with no carotid or supraclavicular bruits Cardiovascular: regular rate and rhythm, no murmurs Musculoskeletal: no deformity Skin:  no  rash/petichiae  Vascular:  Normal pulses all extremities  Neurologic Exam Mental Status: Awake and fully alert. Normal speech and language. Oriented to place and time. Recent and remote memory intact. Attention span, concentration and fund of knowledge appropriate. Mood and affect appropriate.  Cranial Nerves: Pupils equal, briskly reactive to light. Extraocular movements full without nystagmus. Visual fields full to  confrontation. Hearing intact. Facial sensation intact.  Facial  tongue, palate moves normally and symmetrically.  Motor: Normal bulk and tone. Normal strength observed in all extremities  Sensory.: intact to touch , pinprick , position and vibratory sensation.  Coordination: Rapid alternating movements normal in all extremities. Finger-to-nose and heel-to-shin performed accurately  Gait: Stance is slightly hunched.  Stands from seated position without assistance and minimal dfficulty.  Ambulates with rolling walker with mild stiffened left leg but normal stride length and no evidence of imbalance.  Reflexes: 2+ brisk and symmetric. Toes downgoing.      ASSESSMENT/PLAN: 69 year old African-American lady with bilateral embolic strokes in November 2019 of cryptogenic etiology in the setting of sepsis.  Concern for seizure activity and placed on Keppra.  History BLE DVT treated with Eliquis.  Vascular risk factors include HTN, DM and seizures    1. Bilateral Embolic Strokes -Residual deficits: Gait impairment and imbalance -Continue aspirin 81 mg daily for secondary stroke prevention.  -Ensure close PCP follow-up for aggressive stroke risk factor management  2. Possible Seizures, post stroke  -Continue Keppra 500 mg twice daily for seizure prophylaxis.  -Repeat EEG and if normal, may consider decreasing dosage per patient and husband's request.  Discussed possible risks of decreasing dosage and breakthrough seizure activity with patient and husband both verbalized understanding.   She does not currently drive.  3.  HTN -BP goal<130/90 -Managed by PCP  4.  HLD -LDL goal<70 -Currently on Repatha and Zetia -Managed by PCP    Follow-up in 6 months or call earlier if needed   I spent 30 minutes of face-to-face and non-face-to-face time with patient and husband.  This included previsit chart review, lab review, study review, order entry, electronic health record documentation, patient education regarding history of prior stroke, poststroke seizures and use of AED, importance of managing stroke risk factors and answered all questions to patient and husband satisfaction   Frann Rider, AGNP-BC  [facility] [address] [place], [place] [address]  Phone [phone] Fax [phone] Note: This document was prepared with digital dictation and possible smart phrase technology. Any transcriptional errors that result from this process are unintentional.

## 2019-12-13 NOTE — Patient Instructions (Addendum)
Continue aspirin 81 mg daily  for secondary stroke prevention  Continue Keppra 500 mg twice daily for seizure prevention We will repeat EEG - if normal, will discuss decreasing keppra dosage  Continue to follow up with PCP regarding blood pressure management  Maintain strict control of hypertension with blood pressure goal below 130/90 and cholesterol with LDL cholesterol (bad cholesterol) goal below 70 mg/dL.       Followup in the future with me in 6 months or call earlier if needed       Thank you for coming to see [place] at [facility]. I hope we have been able to provide you high quality care today.  You may receive a patient satisfaction survey over the next few weeks. We would appreciate your feedback and comments so that we may continue to improve ourselves and the health of our patients.

## 2019-12-15 NOTE — Progress Notes (Signed)
I agree with the above plan 

## 2020-01-10 DIAGNOSIS — E7800 Pure hypercholesterolemia, unspecified: Secondary | ICD-10-CM | POA: Diagnosis not present

## 2020-01-10 DIAGNOSIS — I10 Essential (primary) hypertension: Secondary | ICD-10-CM | POA: Diagnosis not present

## 2020-01-10 DIAGNOSIS — E118 Type 2 diabetes mellitus with unspecified complications: Secondary | ICD-10-CM | POA: Diagnosis not present

## 2020-01-12 DIAGNOSIS — E7800 Pure hypercholesterolemia, unspecified: Secondary | ICD-10-CM | POA: Diagnosis not present

## 2020-01-12 DIAGNOSIS — I639 Cerebral infarction, unspecified: Secondary | ICD-10-CM | POA: Diagnosis not present

## 2020-01-12 DIAGNOSIS — I10 Essential (primary) hypertension: Secondary | ICD-10-CM | POA: Diagnosis not present

## 2020-02-16 DIAGNOSIS — E7800 Pure hypercholesterolemia, unspecified: Secondary | ICD-10-CM | POA: Diagnosis not present

## 2020-05-12 ENCOUNTER — Other Ambulatory Visit: Payer: Self-pay | Admitting: Adult Health

## 2020-06-18 IMAGING — CT CT HEAD W/O CM
4 series · 17 of 47 positions shown, 19 images · non-contrast
Comparison: 04/01/2005

CLINICAL DATA: Generalize weakness and slurred speech for 2 weeks.

EXAM:
CT HEAD WITHOUT CONTRAST
TECHNIQUE: Contiguous axial images were obtained from the base of the skull
through the vertex without intravenous contrast.

[Series 3: head wo · axial · 0.39mm/px · z∈[-82,+28]mm · 7 of 30 slices shown, 9 images]
[im 4/30  brain]
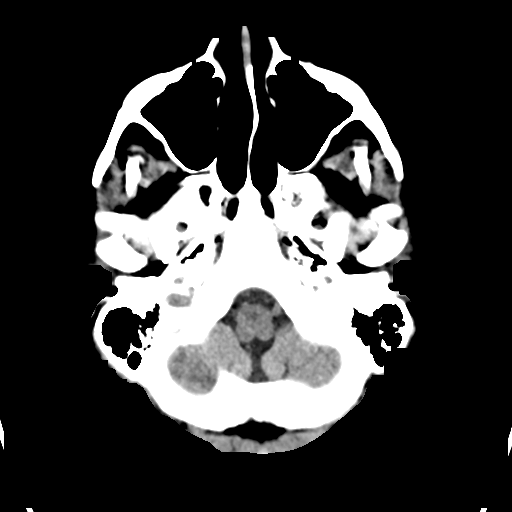
[im 4/30  bone]
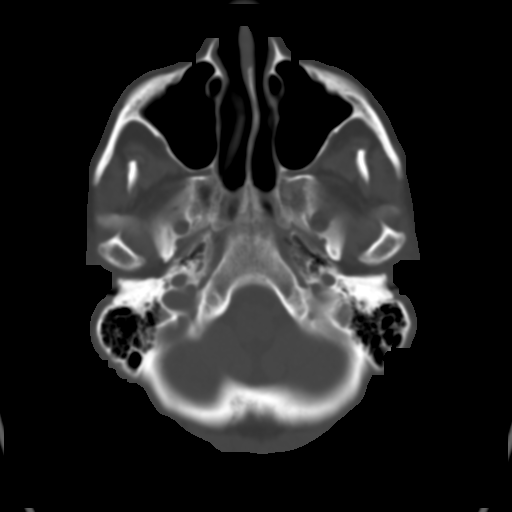
[im 8/30  brain]
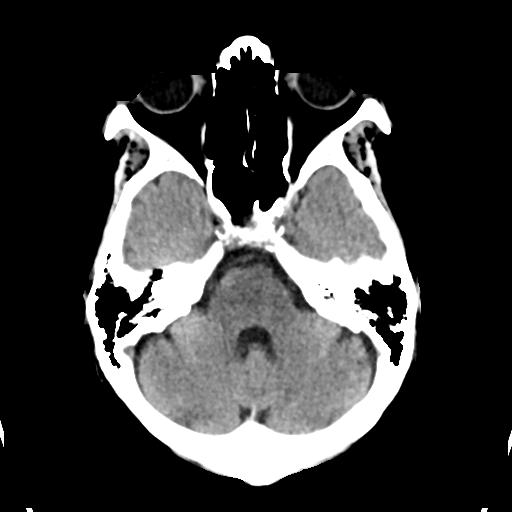
[im 11/30  brain]
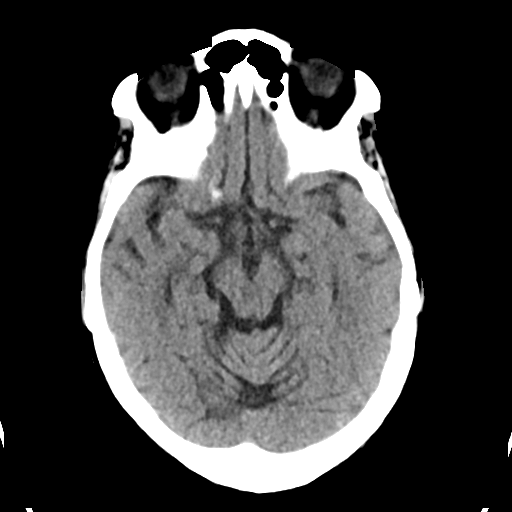
[im 15/30  brain]
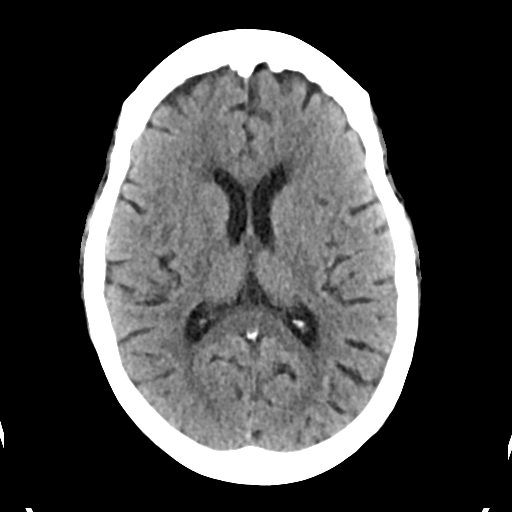
[im 19/30  brain]
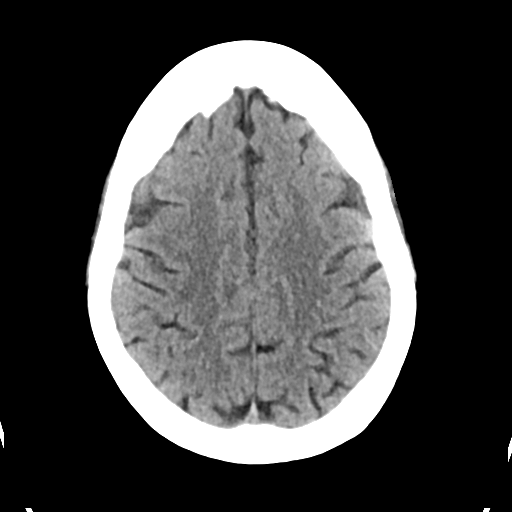
[im 19/30  bone]
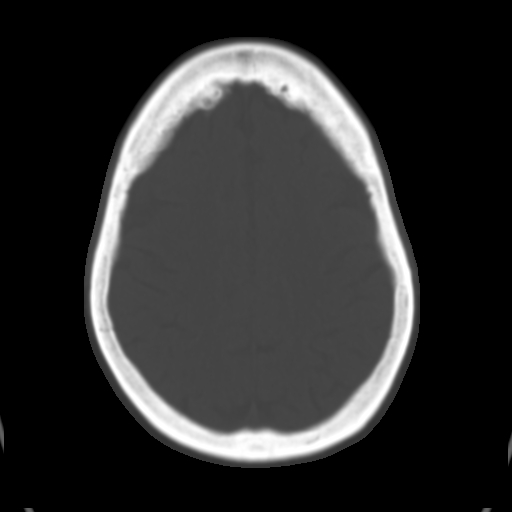
[im 22/30  brain]
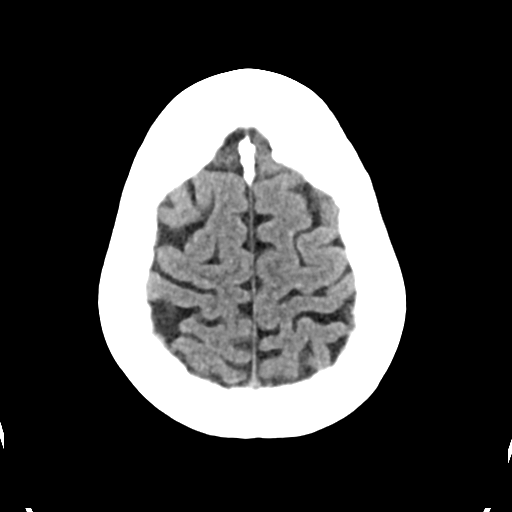
[im 26/30  brain]
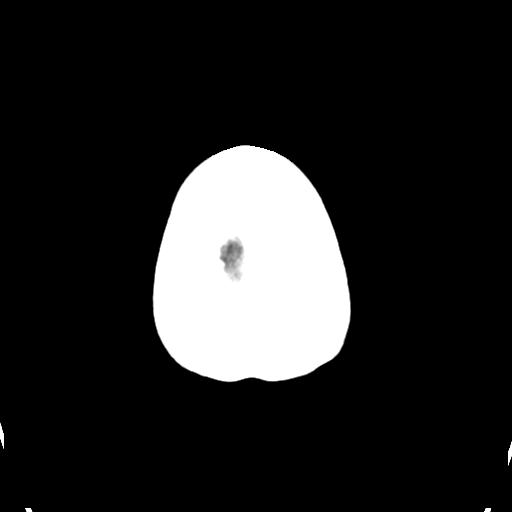

[Series 4: head bone · axial · 0.39mm/px · z∈[-84,-32]mm · 4 of 75 slices shown]
[im 8/75  bone]
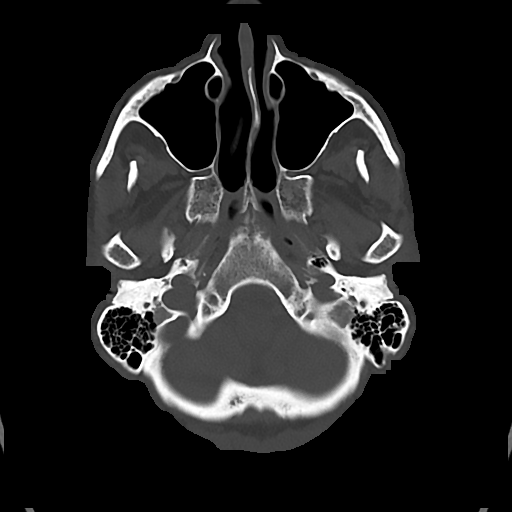
[im 15/75  bone]
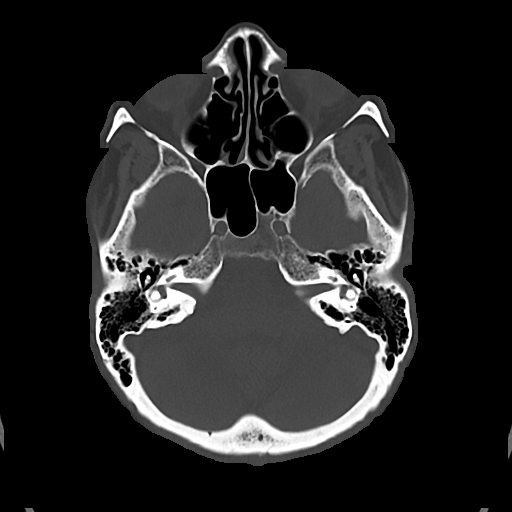
[im 23/75  bone]
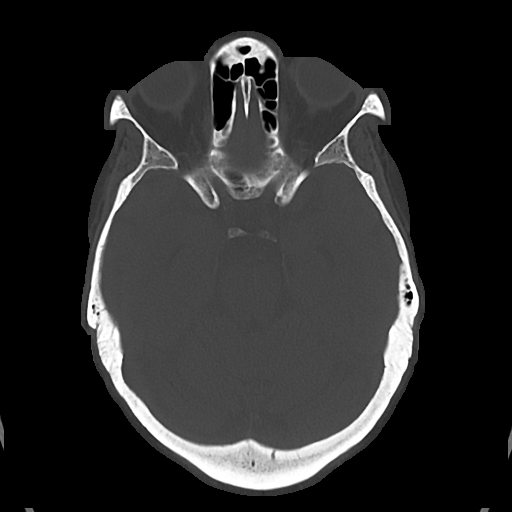
[im 34/75  bone]
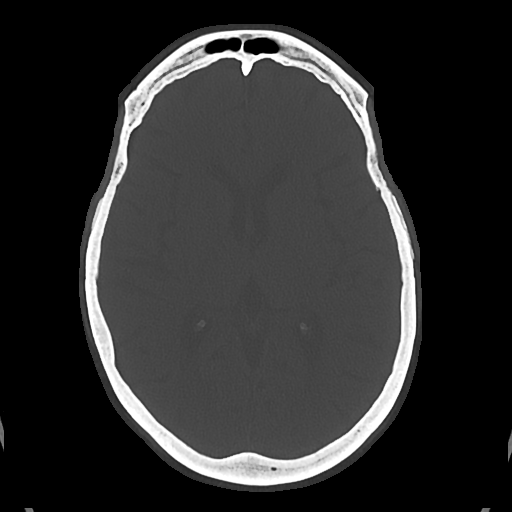

[Series 5: cor soft · coronal · 0.29mm/px · 3 of 67 slices shown]
[im 23/67  brain]
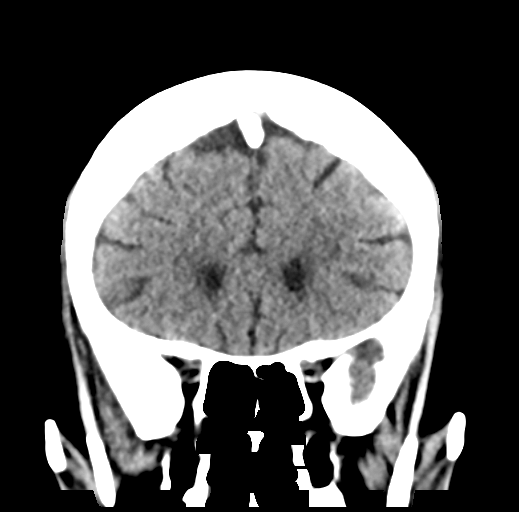
[im 30/67  brain]
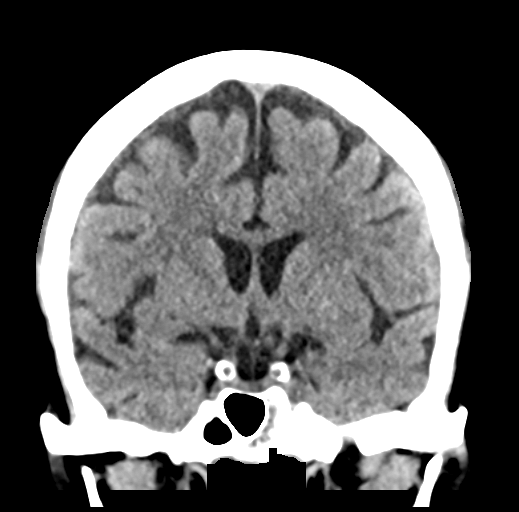
[im 37/67  brain]
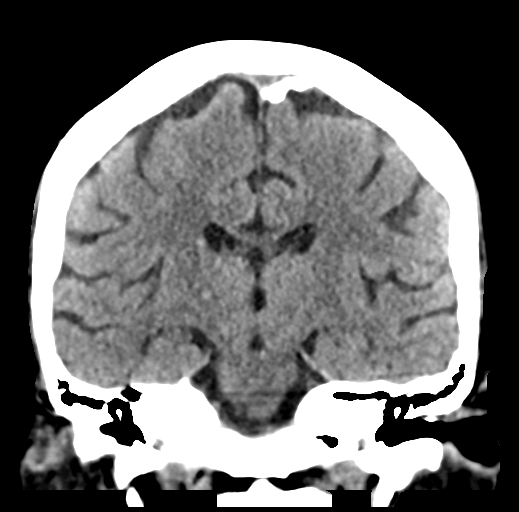

[Series 6: sag soft · sagittal · 0.29mm/px · 3 of 48 slices shown]
[im 16/48  brain]
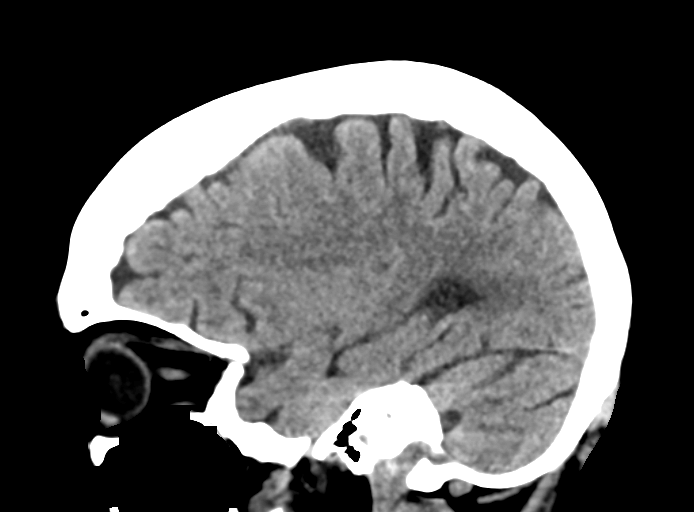
[im 24/48  brain]
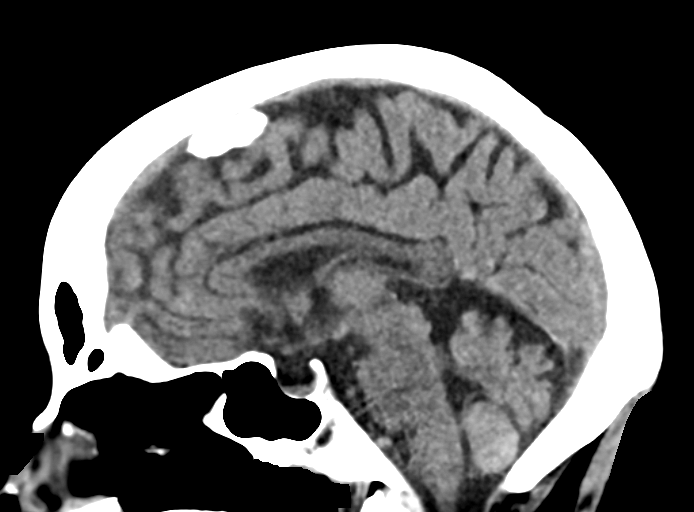
[im 32/48  brain]
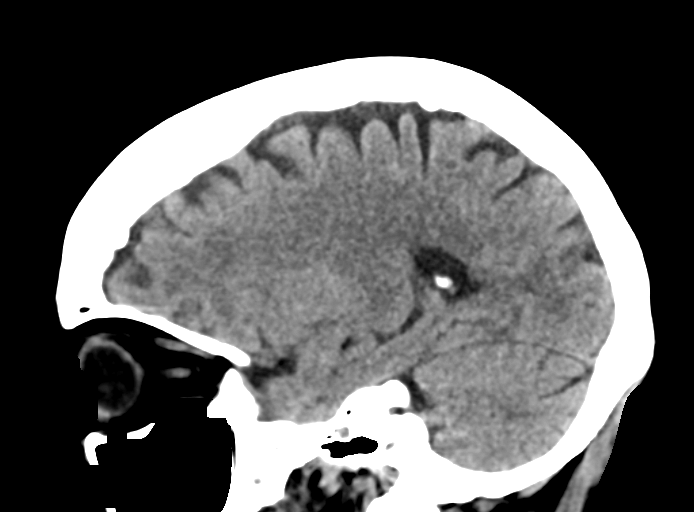

[17 of 47 positions shown; findings below may reference images not displayed]

FINDINGS: Brain: No evidence of acute infarction, hemorrhage, hydrocephalus,
extra-axial collection or mass lesion/mass effect.

Vascular: Mild intracranial arterial vascular calcifications.

Skull: Calvarium appears intact.

Sinuses/Orbits: Paranasal sinuses and mastoid air cells are clear.

Other: None.
IMPRESSION: No acute intracranial abnormality.

## 2020-06-19 ENCOUNTER — Encounter: Payer: Self-pay | Admitting: Adult Health

## 2020-06-19 ENCOUNTER — Ambulatory Visit: Payer: Medicare Other | Admitting: Adult Health

## 2020-06-19 VITALS — BP 144/82 | HR 60 | Ht 62.0 in | Wt 199.0 lb

## 2020-06-19 DIAGNOSIS — I639 Cerebral infarction, unspecified: Secondary | ICD-10-CM

## 2020-06-19 DIAGNOSIS — R569 Unspecified convulsions: Secondary | ICD-10-CM | POA: Diagnosis not present

## 2020-06-19 MED ORDER — LEVETIRACETAM 250 MG PO TABS: 250.0000 mg | ORAL_TABLET | Freq: Two times a day (BID) | ORAL | 2 refills | Status: DC

## 2020-06-19 NOTE — Progress Notes (Signed)
[facility] [address] [place]. [place] [address] [phone]       OFFICE FOLLOW UP NOTE  [name] Date of Birth:  31-Oct-1950 Medical Record Number:  [id number]   Referring MD: Rosalin Hawking Reason for Referral: Strokes  Chief Complaint  Patient presents with  . Follow-up    Rm 14, with husband, states she is doing well      HPI:   Today, 06/19/2020, Maria Avila returns for 16-month follow-up with history of stroke and seizures.  She is accompanied by her husband  Residual deficit: Gait impairment with imbalance - stable with some improvement since prior visit. Use of cane (prevoiusly using RW) -denies any recent falls Reports compliance with aspirin 81 mg daily, Repatha and Zetia without side effects Blood pressure today 144/82 - monitors at home and typically stable  Remains on Keppra 500 mg twice daily and denies any reoccurring seizure activity  No concerns at this time    History provided for reference purposes only Update 12/13/2019 JM: Maria Avila is being seen for stroke follow-up accompanied by her husband. Residual deficits of imbalance and gait impairment which has been stable without worsening Use of RW at all times - completed therapy and she plauted  Denies new stroke/TIA symptoms No recurrent seizure type activity or episode Remains on Keppra 500 mg twice daily without side effects Husband questions ongoing use of AED Remains on aspirin 81 mg daily without bleeding or bruising.  Remains on Repatha and Zetia for HLD management. Blood pressure today 139/77 - intermittently checks at home which has been stable No further concerns at this time.  Update 06/09/2019 JM: Maria Avila is a 70 year old female who is being seen today for stroke follow-up accompanied by her husband. Residual deficits include gait impairment with stiffened left lower leg but endorses ongoing improvement. Continued use of rolling walker at this appointment but does  endorse occasional use of cane. She has continued to work with outpatient PT. She mentions occasional quick sharp head zaps that were occurring prior to the stroke. No new changes since stroke. She follows up with her PCP Dr Shelia Media in March and will have lipid panel collected. Repeated lower extrem ultrasound 12/16/2018 which showed resolution of acute R DVT previously found in 03/2018 but continued chronic L DVT of left peroneal veins. It was recommended to d/c eliquis and initiate aspirin for secondary stroke prevention.  Denies bleeding or bruising from Asprin. She has continued on aspirin without bleeding or bruising. Blood pressure today 148/84. Continues on Keppra 500 mg twice daily tolerating without side effects and without any seizure activity. Denies new or worsening stroke/TIA symptoms.  Update 12/07/2018: Maria Avila is a 70 year old female who is being seen today for multiple embolic infarcts in 14/4818 with residual left hemiparesis and balance difficulty.  She is accompanied today by her husband.  She continues to experience balance difficulties and mild residual left hemiparesis but overall feels as though she has been recovering with improvement since prior visit.  She has recently completed home health PT/OT and plans on starting outpatient PT/OT.  She is awaiting at this time for appointments to be scheduled.  Continues to use RW without recent falls.  She has continued on Eliquis for prior finding of DVT despite recommendation of 71-month duration.  Denies bleeding or bruising with continuation of Eliquis.  She continues on Keppra which she is tolerating well without recurrent seizure activity.  Blood pressure 137/83. She was having monitored at home  by therapy - typically 120s.  No further concerns at this time.  Denies new or worsening stroke/TIA symptoms.  Initial visit 06/15/2018  PS: Maria Avila is a 70 year old African-American lady seen today for initial office consultation visit.  She is  accompanied by her husband.  History is obtained from them and review of electronic medical records.  I personally reviewed imaging films.  Maria Avila has a past medical history of malnutrition, hypertension, diabetes, colitis she apparently presented in septic shock from skilled nursing facility with respiratory failure and chest CT showing free air and fluid.  She had recently undergone endoscopy and had concern for esophageal rupture which may have caused empyema.  Initial CT scan of the head on admission did not show an acute stroke.  She was subsequently extubated and noticed as not moving the right side and having left gaze preference.  NIH stroke scale was 20. Repeat CT scan showed subacute infarct in left parietal lobe.  Neurology was consulted and subsequently MRI scan of the brain was obtained which showed small left parietal, left MCA/ACA watershed, right parietal and pontine punctate infarcts which were felt to be of embolic etiology.  CT angiogram of the brain and neck did not show any significant large vessel stenosis or occlusion.  Interestingly the patient previously had an MRI scan of the brain on 02/16/2018 which did not show an infarct but follow-up MRI on 03/10/2018 showed above-mentioned infarcts.  Lower extremity venous Dopplers showed bilateral deep vein thrombosis.  Transcranial Doppler bubble study was negative for PFO.  TEE could not be done because of fear of questionable esophageal perforation.  2D echo showed normal ejection fraction.  LDL cholesterol was 62 mg percent and hemoglobin A1c was 5.1.  Patient was initially started on IV heparin for few days and subsequently switched to Eliquis.  She was transferred for rehab.  Patient has made gradual recovery.  She is currently living at home with her husband.  She is just finished home physical and occupational therapy.  She still needs 1 person assist to get up and is able to walk with a walker with her husband holding onto her.  The  family were recommended outpatient therapy but they are thinking about it because of cost of co-pay.  The patient states her speech is much better.  Her mind is also improving though occasionally she gets confused.  She has obtained good improvement in the strength on the left side though she still has mild weakness in her legs.  She is tolerating Eliquis well without bleeding or bruising.  She remains on Keppra which is tolerating well.  She had 2 EEGs on the hospitalization both of which had done shown generalized slowing but she was started on Keppra for questionable seizures.  She has not had any further breakthrough seizures    ROS:   14 system review of systems is positive for weakness, balance difficulties and all systems negative  PMH:  Past Medical History:  Diagnosis Date  . Colitis   . Diabetes mellitus without complication ([place])   . Hypertension   . Malnutrition ([place])   . Stroke [facility])     Social History:  Social History   Socioeconomic History  . Marital status: Married    Spouse name: Not on file  . Number of children: Not on file  . Years of education: Not on file  . Highest education level: Not on file  Occupational History  . Not on file  Tobacco Use  .  Smoking status: Former Research scientist (life sciences)  . Smokeless tobacco: Never Used  Vaping Use  . Vaping Use: Never used  Substance and Sexual Activity  . Alcohol use: Not Currently  . Drug use: Not Currently  . Sexual activity: Not on file  Other Topics Concern  . Not on file  Social History Narrative  . Not on file   Social Determinants of Health   Financial Resource Strain: Not on file  Food Insecurity: Not on file  Transportation Needs: Not on file  Physical Activity: Not on file  Stress: Not on file  Social Connections: Not on file  Intimate Partner Violence: Not on file    Medications:   Current Outpatient Medications on File Prior to Visit  Medication Sig Dispense Refill  . acetaminophen (TYLENOL) 325 MG  tablet Take 1-2 tablets (325-650 mg total) by mouth every 4 (four) hours as needed for mild pain.    Marland Kitchen aspirin EC 81 MG tablet Take 81 mg by mouth daily. OTC    . benazepril-hydrochlorthiazide (LOTENSIN HCT) 20-25 MG tablet Take 1 tablet by mouth daily.    . [employer] VITAMIN C 500 MG tablet TAKE 1 TABLET BY MOUTH TWICE A DAY 100 tablet 0  . ezetimibe (ZETIA) 10 MG tablet Take 10 mg by mouth daily.    . hydrochlorothiazide (HYDRODIURIL) 12.5 MG tablet Take 12.5 mg by mouth daily.    Marland Kitchen lidocaine (LIDODERM) 5 % Place 1 patch onto the skin daily. Remove & Discard patch within 12 hours or as directed by MD 10 patch 0  . Multiple Vitamin (MULTIVITAMIN) capsule Take 1 capsule by mouth daily.    . pantoprazole (PROTONIX) 40 MG tablet Take 40 mg by mouth 2 (two) times daily.    Marland Kitchen REPATHA SURECLICK 702 MG/ML SOAJ OVZCHYIF:027 Milligram(s) SUB-Q Every 4 Weeks    . thiamine (VITAMIN B-1) 100 MG tablet TAKE 1 TABLET BY MOUTH EVERY DAY 30 tablet 0   No current facility-administered medications on file prior to visit.    Allergies:   Allergies  Allergen Reactions  . Crestor [Rosuvastatin Calcium] Swelling and Other (See Comments)    Swelling of legs and muscle weakness  . Zocor [Simvastatin] Swelling and Other (See Comments)    Swelling of legs and muscle weakness    Physical Exam  Today's Vitals   06/19/20 1057  BP: (!) 144/82  Pulse: 60  Weight: 199 lb (90.3 kg)  Height: 5\' 2"  (1.575 m)   Body mass index is 36.4 kg/m.  General: Pleasant middle-aged African-American lady seated, in no evident distress Head: head normocephalic and atraumatic.   Neck: supple with no carotid or supraclavicular bruits Cardiovascular: regular rate and rhythm, no murmurs Musculoskeletal: no deformity Skin:  no rash/petichiae  Vascular:  Normal pulses all extremities  Neurologic Exam Mental Status: Awake and fully alert. Normal speech and language. Oriented to place and time. Recent and remote memory intact.  Attention span, concentration and fund of knowledge appropriate. Mood and affect appropriate.  Cranial Nerves: Pupils equal, briskly reactive to light. Extraocular movements full without nystagmus. Visual fields full to confrontation. Hearing intact. Facial sensation intact.  Facial  tongue, palate moves normally and symmetrically.  Motor: Normal bulk and tone. Normal strength observed in all extremities  Sensory.: intact to touch , pinprick , position and vibratory sensation.  Coordination: Rapid alternating movements normal in all extremities. Finger-to-nose and heel-to-shin performed accurately  Gait: Stance is slightly hunched.  Stands from seated position independently.  Gait demonstrates normal stride length  with mild imbalance and use of cane Reflexes: 2+ brisk and symmetric. Toes downgoing.      ASSESSMENT/PLAN: 70 year old African-American lady with bilateral embolic strokes in November 2019 of cryptogenic etiology in the setting of sepsis.  Concern for seizure activity and placed on Keppra.  History BLE DVT treated with Eliquis.  Vascular risk factors include HTN, DM and seizures    1. Bilateral Embolic Strokes -Residual deficits: Gait impairment with imbalance - stable -Continue aspirin 81 mg daily, Zetia and Repatha for secondary stroke prevention.  -HTN: BP goal<130/90.  Well-controlled managed by PCP -HLD: LDL goal<70. On Repatha and Zetia per PCP -Ensure close PCP follow-up for aggressive stroke risk factor management  2. Possible Seizures, post stroke  -No additional seizure activity since initial onset 03/2018 -Decrease Keppra to 250mg  twice daily for 2 months and then discontinue -Recommend repeat EEG 1 month after discontinuing -Discussed possibility of additional seizures with decreasing dosage and discontinuing.  Educated husband on potential symptoms of seizure activity and to call office with any concerns.  She does not currently drive and she is accompanied by her  husband consistently.  Her and her husband verbalized understanding of possible seizure activity and wish to pursue weaning off of AED    Follow-up in 6 months or call earlier if needed   CC:  [place] provider: Dr. Neil Crouch, Thayer Jew, MD    I spent 30 minutes of face-to-face and non-face-to-face time with patient and husband.  This included previsit chart review, lab review, study review, order entry, electronic health record documentation, patient education regarding history of prior stroke, poststroke seizures and weaning off from AED, importance of managing stroke risk factors and answered all other questions to patient and husband satisfaction   Frann Rider, AGNP-BC  [facility] [address] [place], [place] [address]  Phone [phone] Fax [phone] Note: This document was prepared with digital dictation and possible smart phrase technology. Any transcriptional errors that result from this process are unintentional.

## 2020-06-19 NOTE — Progress Notes (Signed)
I agree with the above plan 

## 2020-06-19 NOTE — Patient Instructions (Signed)
Decrease keppra to 250mg  twice daily for 2 months and then discontinue. After 1 month, recommend repeating EEG  Continue aspirin 81 mg daily  and repeatha and zetia  for secondary stroke prevention  Continue to follow up with PCP regarding cholesterol and blood pressure management  Maintain strict control of hypertension with blood pressure goal below 130/90 and cholesterol with LDL cholesterol (bad cholesterol) goal below 70 mg/dL.      Followup in the future with me in 6 months or call earlier if needed     Thank you for coming to see [place] at [facility]. I hope we have been able to provide you high quality care today.  You may receive a patient satisfaction survey over the next few weeks. We would appreciate your feedback and comments so that we may continue to improve ourselves and the health of our patients.   Seizure, Adult A seizure is a sudden burst of abnormal electrical and chemical activity in the brain. Seizures usually last from 30 seconds to 2 minutes.  What are the causes? Common causes of this condition include:  Fever or infection.  Problems that affect the brain. These may include: ? A brain or head injury. ? Bleeding in the brain. ? A brain tumor.  Low levels of blood sugar or salt.  Kidney problems or liver problems.  Conditions that are passed from parent to child (are inherited).  Problems with a substance, such as: ? Having a reaction to a drug or a medicine. ? Stopping the use of a substance all of a sudden (withdrawal).  A stroke.  Disorders that affect how you develop. Sometimes, the cause may not be known.  What increases the risk?  Having someone in your family who has epilepsy. In this condition, seizures happen again and again over time. They have no clear cause.  Having had a tonic-clonic seizure before. This type of seizure causes you to: ? Tighten the muscles of the whole body. ? Lose consciousness.  Having had a  head injury or strokes before.  Having had a lack of oxygen at birth. What are the signs or symptoms? There are many types of seizures. The symptoms vary depending on the type of seizure you have. Symptoms during a seizure  Shaking that you cannot control (convulsions) with fast, jerky movements of muscles.  Stiffness of the body.  Breathing problems.  Feeling mixed up (confused).  Staring or not responding to sound or touch.  Head nodding.  Eyes that blink, flutter, or move fast.  Drooling, grunting, or making clicking sounds with your mouth  Losing control of when you pee or poop. Symptoms before a seizure  Feeling afraid, nervous, or worried.  Feeling like you may vomit.  Feeling like: ? You are moving when you are not. ? Things around you are moving when they are not.  Feeling like you saw or heard something before (dj vu).  Odd tastes or smells.  Changes in how you see. You may see flashing lights or spots. Symptoms after a seizure  Feeling confused.  Feeling sleepy.  Headache.  Sore muscles. How is this treated? If your seizure stops on its own, you will not need treatment. If your seizure lasts longer than 5 minutes, you will normally need treatment. Treatment may include:  Medicines given through an IV tube.  Avoiding things, such as medicines, that are known to cause your seizures.  Medicines to prevent seizures.  A device to prevent or control seizures.  Surgery.  A diet low in carbohydrates and high in fat (ketogenic diet). Follow these instructions at home: Medicines  Take over-the-counter and prescription medicines only as told by your doctor.  Avoid foods or drinks that may keep your medicine from working, such as alcohol. Activity  Follow instructions about driving, swimming, or doing things that would be dangerous if you had another seizure. Wait until your doctor says it is safe for you to do these things.  If you live in the  [place] ask your local department of motor vehicles when you can drive.  Get a lot of rest. Teaching others  Teach friends and family what to do when you have a seizure. They should: ? Help you get down to the ground. ? Protect your head and body. ? Loosen any clothing around your neck. ? Turn you on your side. ? Know whether or not you need emergency care. ? Stay with you until you are better.  Also, tell them what not to do if you have a seizure. Tell them: ? They should not hold you down. ? They should not put anything in your mouth.   General instructions  Avoid anything that gives you seizures.  Keep a seizure diary. Write down: ? What you remember about each seizure. ? What you think caused each seizure.  Keep all follow-up visits. Contact a doctor if:  You have another seizure or seizures. Call the doctor each time you have a seizure.  The pattern of your seizures changes.  You keep having seizures with treatment.  You have symptoms of being sick or having an infection.  You are not able to take your medicine. Get help right away if:  You have any of these problems: ? A seizure that lasts longer than 5 minutes. ? Many seizures in a row and you do not feel better between seizures. ? A seizure that makes it harder to breathe. ? A seizure and you can no longer speak or use part of your body.  You do not wake up right after a seizure.  You get hurt during a seizure.  You feel confused or have pain right after a seizure. These symptoms may be an emergency. Get help right away. Call your local emergency services (911 in the [place]  Do not wait to see if the symptoms will go away.  Do not drive yourself to the hospital. Summary  A seizure is a sudden burst of abnormal electrical and chemical activity in the brain. Seizures normally last from 30 seconds to 2 minutes.  Causes of seizures include illness, injury to the head, low levels of blood sugar or salt, and  certain conditions.  Most seizures will stop on their own in less than 5 minutes. Seizures that last longer than 5 minutes are a medical emergency and need treatment right away.  Many medicines are used to treat seizures. Take over-the-counter and prescription medicines only as told by your doctor. This information is not intended to replace advice given to you by your health care provider. Make sure you discuss any questions you have with your health care provider. Document Revised: 10/28/2019 Document Reviewed: 10/28/2019 Elsevier Patient Education  [place].

## 2020-07-25 DIAGNOSIS — N390 Urinary tract infection, site not specified: Secondary | ICD-10-CM | POA: Diagnosis not present

## 2020-07-25 DIAGNOSIS — E119 Type 2 diabetes mellitus without complications: Secondary | ICD-10-CM | POA: Diagnosis not present

## 2020-07-25 DIAGNOSIS — I10 Essential (primary) hypertension: Secondary | ICD-10-CM | POA: Diagnosis not present

## 2020-08-01 DIAGNOSIS — Z8673 Personal history of transient ischemic attack (TIA), and cerebral infarction without residual deficits: Secondary | ICD-10-CM | POA: Diagnosis not present

## 2020-08-01 DIAGNOSIS — Z87898 Personal history of other specified conditions: Secondary | ICD-10-CM | POA: Diagnosis not present

## 2020-08-01 DIAGNOSIS — E7800 Pure hypercholesterolemia, unspecified: Secondary | ICD-10-CM | POA: Diagnosis not present

## 2020-08-01 DIAGNOSIS — N3000 Acute cystitis without hematuria: Secondary | ICD-10-CM | POA: Diagnosis not present

## 2020-08-01 DIAGNOSIS — R1030 Lower abdominal pain, unspecified: Secondary | ICD-10-CM | POA: Diagnosis not present

## 2020-08-01 DIAGNOSIS — Z0000 Encounter for general adult medical examination without abnormal findings: Secondary | ICD-10-CM | POA: Diagnosis not present

## 2020-08-01 DIAGNOSIS — K8020 Calculus of gallbladder without cholecystitis without obstruction: Secondary | ICD-10-CM | POA: Diagnosis not present

## 2020-08-01 DIAGNOSIS — E118 Type 2 diabetes mellitus with unspecified complications: Secondary | ICD-10-CM | POA: Diagnosis not present

## 2020-08-02 DIAGNOSIS — I10 Essential (primary) hypertension: Secondary | ICD-10-CM | POA: Diagnosis not present

## 2020-08-02 DIAGNOSIS — Z7984 Long term (current) use of oral hypoglycemic drugs: Secondary | ICD-10-CM | POA: Diagnosis not present

## 2020-08-02 DIAGNOSIS — E7800 Pure hypercholesterolemia, unspecified: Secondary | ICD-10-CM | POA: Diagnosis not present

## 2020-08-02 DIAGNOSIS — E118 Type 2 diabetes mellitus with unspecified complications: Secondary | ICD-10-CM | POA: Diagnosis not present

## 2020-09-01 DIAGNOSIS — E7800 Pure hypercholesterolemia, unspecified: Secondary | ICD-10-CM | POA: Diagnosis not present

## 2020-09-01 DIAGNOSIS — Z7984 Long term (current) use of oral hypoglycemic drugs: Secondary | ICD-10-CM | POA: Diagnosis not present

## 2020-09-01 DIAGNOSIS — I10 Essential (primary) hypertension: Secondary | ICD-10-CM | POA: Diagnosis not present

## 2020-09-01 DIAGNOSIS — E118 Type 2 diabetes mellitus with unspecified complications: Secondary | ICD-10-CM | POA: Diagnosis not present

## 2020-09-06 DIAGNOSIS — I10 Essential (primary) hypertension: Secondary | ICD-10-CM | POA: Diagnosis not present

## 2020-09-06 DIAGNOSIS — Z8673 Personal history of transient ischemic attack (TIA), and cerebral infarction without residual deficits: Secondary | ICD-10-CM | POA: Diagnosis not present

## 2020-09-06 DIAGNOSIS — E7800 Pure hypercholesterolemia, unspecified: Secondary | ICD-10-CM | POA: Diagnosis not present

## 2020-09-14 ENCOUNTER — Other Ambulatory Visit: Payer: Self-pay | Admitting: Adult Health

## 2020-10-16 ENCOUNTER — Telehealth: Payer: Self-pay | Admitting: Adult Health

## 2020-10-16 NOTE — Telephone Encounter (Signed)
I called patient twice and wasn't able to reach her or LVM. Maria Avila is out sick and appointment needs rescheduled.

## 2020-10-17 ENCOUNTER — Ambulatory Visit: Payer: Medicare Other | Admitting: Adult Health

## 2020-10-31 ENCOUNTER — Encounter: Payer: Self-pay | Admitting: Adult Health

## 2020-10-31 ENCOUNTER — Ambulatory Visit: Payer: Medicare Other | Admitting: Adult Health

## 2020-10-31 VITALS — BP 138/72 | HR 56 | Ht 62.0 in | Wt 199.0 lb

## 2020-10-31 DIAGNOSIS — R569 Unspecified convulsions: Secondary | ICD-10-CM | POA: Diagnosis not present

## 2020-10-31 DIAGNOSIS — I639 Cerebral infarction, unspecified: Secondary | ICD-10-CM

## 2020-10-31 DIAGNOSIS — Z8673 Personal history of transient ischemic attack (TIA), and cerebral infarction without residual deficits: Secondary | ICD-10-CM | POA: Diagnosis not present

## 2020-10-31 NOTE — Progress Notes (Signed)
[facility] [address] [place]. [place] [address] [phone]       OFFICE FOLLOW UP NOTE  [name] Date of Birth:  Aug 10, 1950 Medical Record Number:  [id number]   Referring MD: Rosalin Hawking Reason for Referral: Strokes  Chief Complaint  Patient presents with   Follow-up    Rm 57 with spouse calvin Pt is well and stable, no new complications       HPI:   Today, 10/31/2020, [name] returns for 45-month stroke and seizure follow-up accompanied by her husband.  Stable from stroke standpoint without new stroke/TIA symptoms.  Reports residual gait impairment which has been stable.  Continues to use a cane and denies any recent falls.  Compliant on aspirin, Repatha and Zetia without associated side effects.  Blood pressure today 138/72.  She has continued on Keppra 250 mg twice and plans on stopping after prescription completed in approximately 3 days.  No seizure activity since 03/2018.  No concerns at this time.    History provided for reference purposes only Update 06/19/2020 JM: [name] returns for 70-month follow-up with history of stroke and seizures.  She is accompanied by her husband  Residual deficit: Gait impairment with imbalance - stable with some improvement since prior visit. Use of cane (prevoiusly using RW) -denies any recent falls Reports compliance with aspirin 81 mg daily, Repatha and Zetia without side effects Blood pressure today 144/82 - monitors at home and typically stable  Remains on Keppra 500 mg twice daily and denies any reoccurring seizure activity  No concerns at this time  Update 12/13/2019 JM: [name] is being seen for stroke follow-up accompanied by her husband. Residual deficits of imbalance and gait impairment which has been stable without worsening Use of RW at all times - completed therapy and she plauted  Denies new stroke/TIA symptoms No recurrent seizure type activity or episode Remains on Keppra 500 mg  twice daily without side effects Husband questions ongoing use of AED Remains on aspirin 81 mg daily without bleeding or bruising.  Remains on Repatha and Zetia for HLD management. Blood pressure today 139/77 - intermittently checks at home which has been stable No further concerns at this time.  Update 06/09/2019 JM: [name] is a 70 year old female who is being seen today for stroke follow-up accompanied by her husband. Residual deficits include gait impairment with stiffened left lower leg but endorses ongoing improvement. Continued use of rolling walker at this appointment but does endorse occasional use of cane. She has continued to work with outpatient PT. She mentions occasional quick sharp head zaps that were occurring prior to the stroke. No new changes since stroke. She follows up with her PCP Dr Shelia Media in March and will have lipid panel collected. Repeated lower extrem ultrasound 12/16/2018 which showed resolution of acute R DVT previously found in 03/2018 but continued chronic L DVT of left peroneal veins. It was recommended to d/c eliquis and initiate aspirin for secondary stroke prevention.  Denies bleeding or bruising from Asprin. She has continued on aspirin without bleeding or bruising. Blood pressure today 148/84. Continues on Keppra 500 mg twice daily tolerating without side effects and without any seizure activity. Denies new or worsening stroke/TIA symptoms.  Update 12/07/2018: [name] is a 70 year old female who is being seen today for multiple embolic infarcts in 11/3708 with residual left hemiparesis and balance difficulty.  She is accompanied today by her husband.  She continues to experience balance difficulties and mild residual  left hemiparesis but overall feels as though she has been recovering with improvement since prior visit.  She has recently completed home health PT/OT and plans on starting outpatient PT/OT.  She is awaiting at this time for appointments to be scheduled.   Continues to use RW without recent falls.  She has continued on Eliquis for prior finding of DVT despite recommendation of 2-month duration.  Denies bleeding or bruising with continuation of Eliquis.  She continues on Keppra which she is tolerating well without recurrent seizure activity.  Blood pressure 137/83. She was having monitored at home by therapy - typically 120s.  No further concerns at this time.  Denies new or worsening stroke/TIA symptoms.  Initial visit 06/15/2018  PS: [name] is a 70 year old African-American lady seen today for initial office consultation visit.  She is accompanied by her husband.  History is obtained from them and review of electronic medical records.  I personally reviewed imaging films.  [name] has a past medical history of malnutrition, hypertension, diabetes, colitis she apparently presented in septic shock from skilled nursing facility with respiratory failure and chest CT showing free air and fluid.  She had recently undergone endoscopy and had concern for esophageal rupture which may have caused empyema.  Initial CT scan of the head on admission did not show an acute stroke.  She was subsequently extubated and noticed as not moving the right side and having left gaze preference.  NIH stroke scale was 20. Repeat CT scan showed subacute infarct in left parietal lobe.  Neurology was consulted and subsequently MRI scan of the brain was obtained which showed small left parietal, left MCA/ACA watershed, right parietal and pontine punctate infarcts which were felt to be of embolic etiology.  CT angiogram of the brain and neck did not show any significant large vessel stenosis or occlusion.  Interestingly the patient previously had an MRI scan of the brain on 02/16/2018 which did not show an infarct but follow-up MRI on 03/10/2018 showed above-mentioned infarcts.  Lower extremity venous Dopplers showed bilateral deep vein thrombosis.  Transcranial Doppler bubble study was  negative for PFO.  TEE could not be done because of fear of questionable esophageal perforation.  2D echo showed normal ejection fraction.  LDL cholesterol was 62 mg percent and hemoglobin A1c was 5.1.  Patient was initially started on IV heparin for few days and subsequently switched to Eliquis.  She was transferred for rehab.  Patient has made gradual recovery.  She is currently living at home with her husband.  She is just finished home physical and occupational therapy.  She still needs 1 person assist to get up and is able to walk with a walker with her husband holding onto her.  The family were recommended outpatient therapy but they are thinking about it because of cost of co-pay.  The patient states her speech is much better.  Her mind is also improving though occasionally she gets confused.  She has obtained good improvement in the strength on the left side though she still has mild weakness in her legs.  She is tolerating Eliquis well without bleeding or bruising.  She remains on Keppra which is tolerating well.  She had 2 EEGs on the hospitalization both of which had done shown generalized slowing but she was started on Keppra for questionable seizures.  She has not had any further breakthrough seizures    ROS:   14 system review of systems is positive for those listed in HPI  and all systems negative  PMH:  Past Medical History:  Diagnosis Date   Colitis    Diabetes mellitus without complication ([place])    Hypertension    Malnutrition ([place])    Stroke ([place])     Social History:  Social History   Socioeconomic History   Marital status: Married    Spouse name: Not on file   Number of children: Not on file   Years of education: Not on file   Highest education level: Not on file  Occupational History   Not on file  Tobacco Use   Smoking status: Former    Pack years: 0.00   Smokeless tobacco: Never  Vaping Use   Vaping Use: Never used  Substance and Sexual Activity   Alcohol use:  Not Currently   Drug use: Not Currently   Sexual activity: Not on file  Other Topics Concern   Not on file  Social History Narrative   Not on file   Social Determinants of Health   Financial Resource Strain: Not on file  Food Insecurity: Not on file  Transportation Needs: Not on file  Physical Activity: Not on file  Stress: Not on file  Social Connections: Not on file  Intimate Partner Violence: Not on file    Medications:   Current Outpatient Medications on File Prior to Visit  Medication Sig Dispense Refill   acetaminophen (TYLENOL) 325 MG tablet Take 1-2 tablets (325-650 mg total) by mouth every 4 (four) hours as needed for mild pain.     aspirin EC 81 MG tablet Take 81 mg by mouth daily. OTC     benazepril-hydrochlorthiazide (LOTENSIN HCT) 20-25 MG tablet Take 1 tablet by mouth daily.     [employer] VITAMIN C 500 MG tablet TAKE 1 TABLET BY MOUTH TWICE A DAY 100 tablet 0   ezetimibe (ZETIA) 10 MG tablet Take 10 mg by mouth daily.     hydrochlorothiazide (HYDRODIURIL) 12.5 MG tablet Take 12.5 mg by mouth daily.     levETIRAcetam (KEPPRA) 250 MG tablet Take 1 tablet (250 mg total) by mouth 2 (two) times daily. 60 tablet 2   lidocaine (LIDODERM) 5 % Place 1 patch onto the skin daily. Remove & Discard patch within 12 hours or as directed by MD 10 patch 0   Multiple Vitamin (MULTIVITAMIN) capsule Take 1 capsule by mouth daily.     pantoprazole (PROTONIX) 40 MG tablet Take 40 mg by mouth 2 (two) times daily.     REPATHA SURECLICK 376 MG/ML SOAJ EGBTDVVO:160 Milligram(s) SUB-Q Every 4 Weeks     thiamine (VITAMIN B-1) 100 MG tablet TAKE 1 TABLET BY MOUTH EVERY DAY 30 tablet 0   No current facility-administered medications on file prior to visit.    Allergies:   Allergies  Allergen Reactions   Crestor [Rosuvastatin Calcium] Swelling and Other (See Comments)    Swelling of legs and muscle weakness   Zocor [Simvastatin] Swelling and Other (See Comments)    Swelling of legs and muscle  weakness    Physical Exam  Today's Vitals   10/31/20 0838  BP: (!) 146/86  Pulse: (!) 56  Weight: 199 lb (90.3 kg)  Height: 5\' 2"  (1.575 m)    Body mass index is 36.4 kg/m.  General: Pleasant middle-aged African-American lady seated, in no evident distress Head: head normocephalic and atraumatic.   Neck: supple with no carotid or supraclavicular bruits Cardiovascular: regular rate and rhythm, no murmurs Musculoskeletal: no deformity Skin:  no rash/petichiae  Vascular:  Normal pulses all extremities  Neurologic Exam Mental Status: Awake and fully alert. Normal speech and language. Oriented to place and time. Recent and remote memory intact. Attention span, concentration and fund of knowledge appropriate. Mood and affect appropriate.  Cranial Nerves: Pupils equal, briskly reactive to light. Extraocular movements full without nystagmus. Visual fields full to confrontation. Hearing intact. Facial sensation intact.  Facial  tongue, palate moves normally and symmetrically.  Motor: Normal bulk and tone. Normal strength observed in all extremities  Sensory.: intact to touch , pinprick , position and vibratory sensation.  Coordination: Rapid alternating movements normal in all extremities. Finger-to-nose and heel-to-shin performed accurately  Gait: Stance is slightly hunched.  Stands from seated position independently.  Gait demonstrates normal stride length with mild imbalance and use of cane.  Tandem walk and heel toe not attempted Reflexes: 2+ brisk and symmetric. Toes downgoing.      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  Concern for seizure activity and placed on Keppra.  History BLE DVT treated with Eliquis.  Vascular risk factors include HTN, and HLD    1. Bilateral Embolic Strokes -Residual deficits: Gait impairment with  imbalance - stable.  Discussed use of cane at all times for fall prevention -Continue aspirin 81 mg daily, Zetia and Repatha for secondary stroke prevention.  -HTN: BP goal<130/90.  Well-controlled managed by PCP -HLD: LDL goal<70. On Repatha and Zetia per PCP with recent LDL 93 -discussed importance of low-cholesterol diet as LDL remains above goal -Ensure close PCP follow-up for aggressive stroke risk factor management  2. Possible Seizures, post stroke  -No additional seizure activity since initial onset 03/2018 -Discontinue Keppra in 3 days (after completion of current prescription) -Repeat EEG -Discussed possibility of additional seizures with discontinuing AED.  Educated husband on potential symptoms of seizure activity and to call office with any concerns.  She does not drive and she is accompanied by her husband at all times.  Her and her husband verbalized understanding of possible seizure activity and wish to discontinue Keppra    Follow-up in 6 months or call earlier if needed   CC:  [facility] provider: Dr. Neil Crouch, Thayer Jew, MD    I spent 31 minutes of face-to-face and non-face-to-face time with patient and husband.  This included previsit chart review, lab review, study review, order entry, electronic health record documentation, and patient and husband discussion and education regarding history of prior stroke including secondary stroke prevention measures and aggressive stroke risk factor management, poststroke seizures and weaning off from AED and indication for repeat EEG and answered all other questions to patient and husband satisfaction   Frann Rider, AGNP-BC  [facility] [address] [place], [place] [address]  Phone [phone] Fax [phone] Note: This document was prepared with digital dictation and possible smart phrase technology. Any transcriptional errors that result from this process are unintentional.

## 2020-10-31 NOTE — Patient Instructions (Signed)
Once you completely stop your Keppra, we will plan on completing an EEG which will look for any abnormalities that could increase your risk of seizures being off of seizure medication.  As discussed at prior visit, there is still a risk of seizures while you are off Keppra.  Please let me know if you experience any seizures or seizure type symptoms  Continue aspirin 81 mg daily  and Repatha and Zetia for secondary stroke prevention  Continue to follow up with PCP/cardiology regarding cholesterol and blood pressure management  Maintain strict control of hypertension with blood pressure goal below 130/90 and cholesterol with LDL cholesterol (bad cholesterol) goal below 70 mg/dL.       Followup in the future with me in 6 months or call earlier if needed       Thank you for coming to see [place] at [facility]. I hope we have been able to provide you high quality care today.  You may receive a patient satisfaction survey over the next few weeks. We would appreciate your feedback and comments so that we may continue to improve ourselves and the health of our patients.

## 2020-11-01 DIAGNOSIS — E7800 Pure hypercholesterolemia, unspecified: Secondary | ICD-10-CM | POA: Diagnosis not present

## 2020-11-01 DIAGNOSIS — E118 Type 2 diabetes mellitus with unspecified complications: Secondary | ICD-10-CM | POA: Diagnosis not present

## 2020-11-01 DIAGNOSIS — Z7984 Long term (current) use of oral hypoglycemic drugs: Secondary | ICD-10-CM | POA: Diagnosis not present

## 2020-11-01 DIAGNOSIS — I10 Essential (primary) hypertension: Secondary | ICD-10-CM | POA: Diagnosis not present

## 2020-11-02 NOTE — Progress Notes (Signed)
I agree with the above plan 

## 2020-11-14 DIAGNOSIS — H5213 Myopia, bilateral: Secondary | ICD-10-CM | POA: Diagnosis not present

## 2020-11-14 DIAGNOSIS — E119 Type 2 diabetes mellitus without complications: Secondary | ICD-10-CM | POA: Diagnosis not present

## 2020-11-14 DIAGNOSIS — H2513 Age-related nuclear cataract, bilateral: Secondary | ICD-10-CM | POA: Diagnosis not present

## 2020-11-14 DIAGNOSIS — H1789 Other corneal scars and opacities: Secondary | ICD-10-CM | POA: Diagnosis not present

## 2020-12-02 DIAGNOSIS — I10 Essential (primary) hypertension: Secondary | ICD-10-CM | POA: Diagnosis not present

## 2020-12-02 DIAGNOSIS — E118 Type 2 diabetes mellitus with unspecified complications: Secondary | ICD-10-CM | POA: Diagnosis not present

## 2020-12-02 DIAGNOSIS — Z7984 Long term (current) use of oral hypoglycemic drugs: Secondary | ICD-10-CM | POA: Diagnosis not present

## 2020-12-02 DIAGNOSIS — E7800 Pure hypercholesterolemia, unspecified: Secondary | ICD-10-CM | POA: Diagnosis not present

## 2021-01-08 DIAGNOSIS — Z8673 Personal history of transient ischemic attack (TIA), and cerebral infarction without residual deficits: Secondary | ICD-10-CM | POA: Diagnosis not present

## 2021-01-08 DIAGNOSIS — E7800 Pure hypercholesterolemia, unspecified: Secondary | ICD-10-CM | POA: Diagnosis not present

## 2021-01-08 DIAGNOSIS — E118 Type 2 diabetes mellitus with unspecified complications: Secondary | ICD-10-CM | POA: Diagnosis not present

## 2021-01-08 DIAGNOSIS — I10 Essential (primary) hypertension: Secondary | ICD-10-CM | POA: Diagnosis not present

## 2021-01-09 DIAGNOSIS — Z1231 Encounter for screening mammogram for malignant neoplasm of breast: Secondary | ICD-10-CM | POA: Diagnosis not present

## 2021-01-10 DIAGNOSIS — I10 Essential (primary) hypertension: Secondary | ICD-10-CM | POA: Diagnosis not present

## 2021-01-10 DIAGNOSIS — E118 Type 2 diabetes mellitus with unspecified complications: Secondary | ICD-10-CM | POA: Diagnosis not present

## 2021-01-10 DIAGNOSIS — E7800 Pure hypercholesterolemia, unspecified: Secondary | ICD-10-CM | POA: Diagnosis not present

## 2021-01-10 DIAGNOSIS — Z8673 Personal history of transient ischemic attack (TIA), and cerebral infarction without residual deficits: Secondary | ICD-10-CM | POA: Diagnosis not present

## 2021-03-04 DIAGNOSIS — E7800 Pure hypercholesterolemia, unspecified: Secondary | ICD-10-CM | POA: Diagnosis not present

## 2021-03-04 DIAGNOSIS — I10 Essential (primary) hypertension: Secondary | ICD-10-CM | POA: Diagnosis not present

## 2021-03-04 DIAGNOSIS — Z7984 Long term (current) use of oral hypoglycemic drugs: Secondary | ICD-10-CM | POA: Diagnosis not present

## 2021-03-04 DIAGNOSIS — E118 Type 2 diabetes mellitus with unspecified complications: Secondary | ICD-10-CM | POA: Diagnosis not present

## 2021-04-09 DIAGNOSIS — I10 Essential (primary) hypertension: Secondary | ICD-10-CM | POA: Diagnosis not present

## 2021-04-09 DIAGNOSIS — E7800 Pure hypercholesterolemia, unspecified: Secondary | ICD-10-CM | POA: Diagnosis not present

## 2021-04-09 DIAGNOSIS — Z8673 Personal history of transient ischemic attack (TIA), and cerebral infarction without residual deficits: Secondary | ICD-10-CM | POA: Diagnosis not present

## 2021-04-09 DIAGNOSIS — E118 Type 2 diabetes mellitus with unspecified complications: Secondary | ICD-10-CM | POA: Diagnosis not present

## 2021-04-11 DIAGNOSIS — I10 Essential (primary) hypertension: Secondary | ICD-10-CM | POA: Diagnosis not present

## 2021-04-11 DIAGNOSIS — Z8673 Personal history of transient ischemic attack (TIA), and cerebral infarction without residual deficits: Secondary | ICD-10-CM | POA: Diagnosis not present

## 2021-04-11 DIAGNOSIS — E7800 Pure hypercholesterolemia, unspecified: Secondary | ICD-10-CM | POA: Diagnosis not present

## 2021-04-11 DIAGNOSIS — E118 Type 2 diabetes mellitus with unspecified complications: Secondary | ICD-10-CM | POA: Diagnosis not present

## 2021-05-13 ENCOUNTER — Ambulatory Visit: Payer: Medicare Other | Admitting: Adult Health

## 2021-05-13 ENCOUNTER — Encounter: Payer: Self-pay | Admitting: Adult Health

## 2021-05-13 ENCOUNTER — Other Ambulatory Visit: Payer: Self-pay

## 2021-05-13 VITALS — BP 143/81 | HR 62 | Ht 62.0 in | Wt 204.0 lb

## 2021-05-13 DIAGNOSIS — R569 Unspecified convulsions: Secondary | ICD-10-CM

## 2021-05-13 DIAGNOSIS — Z8673 Personal history of transient ischemic attack (TIA), and cerebral infarction without residual deficits: Secondary | ICD-10-CM | POA: Diagnosis not present

## 2021-05-13 DIAGNOSIS — I639 Cerebral infarction, unspecified: Secondary | ICD-10-CM | POA: Diagnosis not present

## 2021-05-13 NOTE — Patient Instructions (Addendum)
Continue aspirin 81 mg daily  and Repatha and Zetia for secondary stroke prevention  Continue to follow up with PCP regarding cholesterol and blood pressure management  Maintain strict control of hypertension with blood pressure goal below 130/90 and cholesterol with LDL cholesterol (bad cholesterol) goal below 70 mg/dL.   Signs of a Stroke? Follow the BEFAST method:  Balance Watch for a sudden loss of balance, trouble with coordination or vertigo Eyes Is there a sudden loss of vision in one or both eyes? Or double vision?  Face: Ask the person to smile. Does one side of the face droop or is it numb?  Arms: Ask the person to raise both arms. Does one arm drift downward? Is there weakness or numbness of a leg? Speech: Ask the person to repeat a simple phrase. Does the speech sound slurred/strange? Is the person confused ? Time: If you observe any of these signs, call 911.         Thank you for coming to see [place] at [facility]. I hope we have been able to provide you high quality care today.  You may receive a patient satisfaction survey over the next few weeks. We would appreciate your feedback and comments so that we may continue to improve ourselves and the health of our patients.

## 2021-05-13 NOTE — Progress Notes (Signed)
[facility] [address] [place]. [place] [address] [phone]       OFFICE FOLLOW UP NOTE  [name] Date of Birth:  1950/11/11 Medical Record Number:  [id number]   Referring MD: Rosalin Hawking Reason for Referral: Strokes  Chief Complaint  Patient presents with   Follow-up    RM 3  with spouse calvin  Pt is well and stable, no new concerns       HPI:   Update 05/13/2021 JM: Patient returns for 57-month stroke and seizure follow-up accompanied by her husband.  Overall stable.  Denies new stroke/TIA symptoms - residual gait impairment stable. Use of cane without falls.  Compliant on aspirin, Repatha and Zetia -denies side effects.  Blood pressure today 143/81. Has been off keppra since 11/2020 with no additional seizure activity.  She does not drive.  Able to maintain ADLs independently and IADLs assisted by husband.  No new concerns at this time.    History provided for reference purposes only Update 10/31/2020 JM: [name] returns for 34-month stroke and seizure follow-up accompanied by her husband.  Stable from stroke standpoint without new stroke/TIA symptoms.  Reports residual gait impairment which has been stable.  Continues to use a cane and denies any recent falls.  Compliant on aspirin, Repatha and Zetia without associated side effects.  Blood pressure today 138/72.  She has continued on Keppra 250 mg twice and plans on stopping after prescription completed in approximately 3 days.  No seizure activity since 03/2018.  No concerns at this time.   Update 06/19/2020 JM: [name] returns for 3-month follow-up with history of stroke and seizures.  She is accompanied by her husband  Residual deficit: Gait impairment with imbalance - stable with some improvement since prior visit. Use of cane (prevoiusly using RW) -denies any recent falls Reports compliance with aspirin 81 mg daily, Repatha and Zetia without side effects Blood pressure today 144/82 -  monitors at home and typically stable  Remains on Keppra 500 mg twice daily and denies any reoccurring seizure activity  No concerns at this time  Update 12/13/2019 JM: [name] is being seen for stroke follow-up accompanied by her husband. Residual deficits of imbalance and gait impairment which has been stable without worsening Use of RW at all times - completed therapy and she plauted  Denies new stroke/TIA symptoms No recurrent seizure type activity or episode Remains on Keppra 500 mg twice daily without side effects Husband questions ongoing use of AED Remains on aspirin 81 mg daily without bleeding or bruising.  Remains on Repatha and Zetia for HLD management. Blood pressure today 139/77 - intermittently checks at home which has been stable No further concerns at this time.  Update 06/09/2019 JM: [name] is a 71 year old female who is being seen today for stroke follow-up accompanied by her husband. Residual deficits include gait impairment with stiffened left lower leg but endorses ongoing improvement. Continued use of rolling walker at this appointment but does endorse occasional use of cane. She has continued to work with outpatient PT. She mentions occasional quick sharp head zaps that were occurring prior to the stroke. No new changes since stroke. She follows up with her PCP Dr Shelia Media in March and will have lipid panel collected. Repeated lower extrem ultrasound 12/16/2018 which showed resolution of acute R DVT previously found in 03/2018 but continued chronic L DVT of left peroneal veins. It was recommended to d/c eliquis and initiate aspirin for secondary stroke prevention.  Denies bleeding or bruising from Asprin. She has continued on aspirin without bleeding or bruising. Blood pressure today 148/84. Continues on Keppra 500 mg twice daily tolerating without side effects and without any seizure activity. Denies new or worsening stroke/TIA symptoms.  Update 12/07/2018: [name] is a  71 year old female who is being seen today for multiple embolic infarcts in 62/3762 with residual left hemiparesis and balance difficulty.  She is accompanied today by her husband.  She continues to experience balance difficulties and mild residual left hemiparesis but overall feels as though she has been recovering with improvement since prior visit.  She has recently completed home health PT/OT and plans on starting outpatient PT/OT.  She is awaiting at this time for appointments to be scheduled.  Continues to use RW without recent falls.  She has continued on Eliquis for prior finding of DVT despite recommendation of 27-month duration.  Denies bleeding or bruising with continuation of Eliquis.  She continues on Keppra which she is tolerating well without recurrent seizure activity.  Blood pressure 137/83. She was having monitored at home by therapy - typically 120s.  No further concerns at this time.  Denies new or worsening stroke/TIA symptoms.  Initial visit 06/15/2018  PS: [name] is a 71 year old African-American lady seen today for initial office consultation visit.  She is accompanied by her husband.  History is obtained from them and review of electronic medical records.  I personally reviewed imaging films.  [name] has a past medical history of malnutrition, hypertension, diabetes, colitis she apparently presented in septic shock from skilled nursing facility with respiratory failure and chest CT showing free air and fluid.  She had recently undergone endoscopy and had concern for esophageal rupture which may have caused empyema.  Initial CT scan of the head on admission did not show an acute stroke.  She was subsequently extubated and noticed as not moving the right side and having left gaze preference.  NIH stroke scale was 20. Repeat CT scan showed subacute infarct in left parietal lobe.  Neurology was consulted and subsequently MRI scan of the brain was obtained which showed small left  parietal, left MCA/ACA watershed, right parietal and pontine punctate infarcts which were felt to be of embolic etiology.  CT angiogram of the brain and neck did not show any significant large vessel stenosis or occlusion.  Interestingly the patient previously had an MRI scan of the brain on 02/16/2018 which did not show an infarct but follow-up MRI on 03/10/2018 showed above-mentioned infarcts.  Lower extremity venous Dopplers showed bilateral deep vein thrombosis.  Transcranial Doppler bubble study was negative for PFO.  TEE could not be done because of fear of questionable esophageal perforation.  2D echo showed normal ejection fraction.  LDL cholesterol was 62 mg percent and hemoglobin A1c was 5.1.  Patient was initially started on IV heparin for few days and subsequently switched to Eliquis.  She was transferred for rehab.  Patient has made gradual recovery.  She is currently living at home with her husband.  She is just finished home physical and occupational therapy.  She still needs 1 person assist to get up and is able to walk with a walker with her husband holding onto her.  The family were recommended outpatient therapy but they are thinking about it because of cost of co-pay.  The patient states her speech is much better.  Her mind is also improving though occasionally she gets confused.  She has obtained good improvement in  the strength on the left side though she still has mild weakness in her legs.  She is tolerating Eliquis well without bleeding or bruising.  She remains on Keppra which is tolerating well.  She had 2 EEGs on the hospitalization both of which had done shown generalized slowing but she was started on Keppra for questionable seizures.  She has not had any further breakthrough seizures    ROS:   14 system review of systems is positive for those listed in HPI and all systems negative  PMH:  Past Medical History:  Diagnosis Date   Colitis    Diabetes mellitus without complication  ([place])    Hypertension    Malnutrition ([place])    Stroke ([place])     Social History:  Social History   Socioeconomic History   Marital status: Married    Spouse name: Not on file   Number of children: Not on file   Years of education: Not on file   Highest education level: Not on file  Occupational History   Not on file  Tobacco Use   Smoking status: Former   Smokeless tobacco: Never  Vaping Use   Vaping Use: Never used  Substance and Sexual Activity   Alcohol use: Not Currently   Drug use: Not Currently   Sexual activity: Not on file  Other Topics Concern   Not on file  Social History Narrative   Not on file   Social Determinants of Health   Financial Resource Strain: Not on file  Food Insecurity: Not on file  Transportation Needs: Not on file  Physical Activity: Not on file  Stress: Not on file  Social Connections: Not on file  Intimate Partner Violence: Not on file    Medications:   Current Outpatient Medications on File Prior to Visit  Medication Sig Dispense Refill   acetaminophen (TYLENOL) 325 MG tablet Take 1-2 tablets (325-650 mg total) by mouth every 4 (four) hours as needed for mild pain.     aspirin EC 81 MG tablet Take 81 mg by mouth daily. OTC     benazepril-hydrochlorthiazide (LOTENSIN HCT) 20-25 MG tablet Take 1 tablet by mouth daily.     CVS VITAMIN C 500 MG tablet TAKE 1 TABLET BY MOUTH TWICE A DAY 100 tablet 0   ezetimibe (ZETIA) 10 MG tablet Take 10 mg by mouth daily.     hydrochlorothiazide (HYDRODIURIL) 12.5 MG tablet Take 12.5 mg by mouth daily.     lidocaine (LIDODERM) 5 % Place 1 patch onto the skin daily. Remove & Discard patch within 12 hours or as directed by MD 10 patch 0   Multiple Vitamin (MULTIVITAMIN) capsule Take 1 capsule by mouth daily.     pantoprazole (PROTONIX) 40 MG tablet Take 40 mg by mouth 2 (two) times daily.     REPATHA SURECLICK 010 MG/ML SOAJ [id number] Milligram(s) SUB-Q Every 4 Weeks     thiamine (VITAMIN B-1) 100 MG  tablet TAKE 1 TABLET BY MOUTH EVERY DAY 30 tablet 0   No current facility-administered medications on file prior to visit.    Allergies:   Allergies  Allergen Reactions   Crestor [Rosuvastatin Calcium] Swelling and Other (See Comments)    Swelling of legs and muscle weakness   Zocor [Simvastatin] Swelling and Other (See Comments)    Swelling of legs and muscle weakness    Physical Exam  Today's Vitals   05/13/21 1004  BP: (!) 143/81  Pulse: 62  Weight: 204 lb (92.5 kg)  Height: 5\' 2"  (1.575 m)     Body mass index is 37.31 kg/m.  General: Pleasant middle-aged African-American lady seated, in no evident distress Head: head normocephalic and atraumatic.   Neck: supple with no carotid or supraclavicular bruits Cardiovascular: regular rate and rhythm, no murmurs Musculoskeletal: no deformity Skin:  no rash/petichiae  Vascular:  Normal pulses all extremities  Neurologic Exam Mental Status: Awake and fully alert. Normal speech and language. Oriented to place and time. Recent and remote memory intact. Attention span, concentration and fund of knowledge appropriate. Mood and affect appropriate.  Cranial Nerves: Pupils equal, briskly reactive to light. Extraocular movements full without nystagmus. Visual fields full to confrontation. Hearing intact. Facial sensation intact.  Facial  tongue, palate moves normally and symmetrically.  Motor: Normal bulk and tone. Normal strength observed in all extremities  Sensory.: intact to touch , pinprick , position and vibratory sensation.  Coordination: Rapid alternating movements normal in all extremities. Finger-to-nose and heel-to-shin performed accurately  Gait: Stance is slightly hunched.  Stands from seated position independently.  Gait demonstrates normal stride length with mild imbalance and use of cane.  Tandem walk and heel toe not attempted Reflexes: 2+ brisk and symmetric. Toes downgoing.      ASSESSMENT/PLAN: 71 year old  African-American lady with bilateral embolic strokes in November 2019 of cryptogenic etiology, possibly hypercoagulable state in the setting of severe sepsis with residual gait impairment.  Concern for seizure activity 03/2018 and placed on Keppra.  History BLE DVT treated with Eliquis.  Vascular risk factors include HTN, and HLD    1. Bilateral Embolic Strokes -Continue aspirin 81 mg daily, Zetia and Repatha for secondary stroke prevention all managed by PCP  -HTN: BP goal<130/90.  Well-controlled managed by PCP -HLD: LDL goal<70. On Repatha and Zetia per PCP -Ensure close PCP follow-up for aggressive stroke risk factor management  2. Possible Seizures, post stroke  -No additional seizure activity since initial onset 03/2018 -off keppra since 11/2020  -continue to monitor. She does not drive and is not alone for prolonged periods of time    Overall stable from stroke/seizure standpoint routinely followed by PCP for aggressive stroke risk factor management- can follow up here as needed     CC:  [name], MD    I spent 26 minutes of face-to-face and non-face-to-face time with patient and husband.  This included previsit chart review, lab review, study review, electronic health record documentation, and patient and husband discussion and education regarding history of prior stroke including secondary stroke prevention measures and aggressive stroke risk factor management, poststroke seizures and stopping AED and answered all other questions to patient and husband satisfaction  Frann Rider, AGNP-BC  [facility] [address] [place], [place] [address]  Phone [phone] Fax [phone] Note: This document was prepared with digital dictation and possible smart phrase technology. Any transcriptional errors that result from this process are unintentional.

## 2021-07-30 DIAGNOSIS — E7800 Pure hypercholesterolemia, unspecified: Secondary | ICD-10-CM | POA: Diagnosis not present

## 2021-07-30 DIAGNOSIS — E119 Type 2 diabetes mellitus without complications: Secondary | ICD-10-CM | POA: Diagnosis not present

## 2021-07-30 DIAGNOSIS — I10 Essential (primary) hypertension: Secondary | ICD-10-CM | POA: Diagnosis not present

## 2021-08-02 ENCOUNTER — Other Ambulatory Visit: Payer: Self-pay | Admitting: Internal Medicine

## 2021-08-02 DIAGNOSIS — I10 Essential (primary) hypertension: Secondary | ICD-10-CM

## 2021-08-02 DIAGNOSIS — E7800 Pure hypercholesterolemia, unspecified: Secondary | ICD-10-CM | POA: Diagnosis not present

## 2021-08-02 DIAGNOSIS — Z7984 Long term (current) use of oral hypoglycemic drugs: Secondary | ICD-10-CM | POA: Diagnosis not present

## 2021-08-02 DIAGNOSIS — I82552 Chronic embolism and thrombosis of left peroneal vein: Secondary | ICD-10-CM | POA: Diagnosis not present

## 2021-08-02 DIAGNOSIS — D709 Neutropenia, unspecified: Secondary | ICD-10-CM | POA: Diagnosis not present

## 2021-08-02 DIAGNOSIS — Z87898 Personal history of other specified conditions: Secondary | ICD-10-CM | POA: Diagnosis not present

## 2021-08-02 DIAGNOSIS — Z8673 Personal history of transient ischemic attack (TIA), and cerebral infarction without residual deficits: Secondary | ICD-10-CM | POA: Diagnosis not present

## 2021-08-02 DIAGNOSIS — E118 Type 2 diabetes mellitus with unspecified complications: Secondary | ICD-10-CM | POA: Diagnosis not present

## 2021-08-02 DIAGNOSIS — Z0000 Encounter for general adult medical examination without abnormal findings: Secondary | ICD-10-CM | POA: Diagnosis not present

## 2021-08-02 DIAGNOSIS — R9431 Abnormal electrocardiogram [ECG] [EKG]: Secondary | ICD-10-CM | POA: Diagnosis not present

## 2021-08-09 ENCOUNTER — Telehealth: Payer: Self-pay | Admitting: Oncology

## 2021-08-09 NOTE — Telephone Encounter (Signed)
Scheduled appt per 4/4 referral. Pt is aware of appt date and time. Pt is aware to arrive 15 mins prior to appt time and to bring and updated insurance card. Pt is aware of appt location.   ?

## 2021-08-21 ENCOUNTER — Inpatient Hospital Stay: Payer: Medicare Other | Attending: Oncology | Admitting: Oncology

## 2021-08-21 ENCOUNTER — Other Ambulatory Visit: Payer: Self-pay

## 2021-08-21 VITALS — BP 150/70 | HR 61 | Temp 97.5°F | Resp 16 | Wt 204.5 lb

## 2021-08-21 DIAGNOSIS — D72820 Lymphocytosis (symptomatic): Secondary | ICD-10-CM | POA: Diagnosis not present

## 2021-08-21 DIAGNOSIS — D709 Neutropenia, unspecified: Secondary | ICD-10-CM | POA: Diagnosis not present

## 2021-08-21 DIAGNOSIS — E119 Type 2 diabetes mellitus without complications: Secondary | ICD-10-CM

## 2021-08-21 DIAGNOSIS — I82453 Acute embolism and thrombosis of peroneal vein, bilateral: Secondary | ICD-10-CM

## 2021-08-21 DIAGNOSIS — Z86718 Personal history of other venous thrombosis and embolism: Secondary | ICD-10-CM

## 2021-08-21 DIAGNOSIS — Z8673 Personal history of transient ischemic attack (TIA), and cerebral infarction without residual deficits: Secondary | ICD-10-CM

## 2021-08-21 DIAGNOSIS — Z79899 Other long term (current) drug therapy: Secondary | ICD-10-CM | POA: Insufficient documentation

## 2021-08-21 DIAGNOSIS — I10 Essential (primary) hypertension: Secondary | ICD-10-CM

## 2021-08-21 NOTE — Progress Notes (Signed)
?Reason for the request:    Neutropenia, venous thrombosis ? ?HPI: I was asked by Dr. Shelia Media to evaluate Maria Avila the evaluation of chronic neutropenia and lymphocytosis in addition to deep vein thrombosis.  She is a 71 year old woman seen in evaluation back in 2019 for lymphocytosis and neutropenia.  Her work-up at that time did not reveal any evidence to suggest lymphoproliferative disorder.  Her flow cytometry was unrevealing.  Since that time, she has had fluctuating neutrophil counts with absolute neutrophil count close to 1500.  CBC obtained on July 30, 2021 showed a white cell count of 6.5, hemoglobin of 12.9 and platelet count 211.  The neutrophil percentage is around 25% and mild lymphocytosis of 4300.  Her absolute neutrophil count is 1600.  This has been consistent with her baseline. ? ?She was diagnosed with deep vein thrombosis in 2019 involving bilateral lower extremity.  That was in the setting of hospitalization related to acute stroke.  She is no longer on anticoagulation at this time other than low-dose aspirin.  She denies any recent recurrent thrombosis or bleeding.  She is ambulatory with the help of a cane.  She denies any recurrent fevers, chills or sweats.  ? ?She does not report any headaches, blurry vision, syncope or seizures. Does not report any fevers, chills or sweats.  Does not report any cough, wheezing or hemoptysis.  Does not report any chest pain, palpitation, orthopnea or leg edema.  Does not report any nausea, vomiting or abdominal pain.  Does not report any constipation or diarrhea.  Does not report any skeletal complaints.    Does not report frequency, urgency or hematuria.  Does not report any skin rashes or lesions. Does not report any heat or cold intolerance.  Does not report any lymphadenopathy or petechiae.  Does not report any anxiety or depression.  Remaining review of systems is negative.  ? ? ? ?Past Medical History:  ?Diagnosis Date  ? Colitis   ? Diabetes  mellitus without complication ([place])   ? Hypertension   ? Malnutrition ([place])   ? Stroke [facility] ([place]   ?: ? ? ?Past Surgical History:  ?Procedure Laterality Date  ? BIOPSY  02/26/2018  ? Procedure: BIOPSY;  Surgeon: Wilford Corner, MD;  Location: [facility];  Service: Endoscopy;;  ? COLONOSCOPY WITH PROPOFOL N/A 02/26/2018  ? Procedure: COLONOSCOPY WITH PROPOFOL;  Surgeon: Wilford Corner, MD;  Location: [facility];  Service: Endoscopy;  Laterality: N/A;  ? ESOPHAGOGASTRODUODENOSCOPY (EGD) WITH PROPOFOL N/A 02/26/2018  ? Procedure: ESOPHAGOGASTRODUODENOSCOPY (EGD) WITH PROPOFOL;  Surgeon: Wilford Corner, MD;  Location: [facility];  Service: Endoscopy;  Laterality: N/A;  ? NO PAST SURGERIES    ? UNCERTAIN OF NAME OF SURGERY  ?: ? ? ?Current Outpatient Medications:  ?  acetaminophen (TYLENOL) 325 MG tablet, Take 1-2 tablets (325-650 mg total) by mouth every 4 (four) hours as needed for mild pain., Disp: , Rfl:  ?  aspirin EC 81 MG tablet, Take 81 mg by mouth daily. OTC, Disp: , Rfl:  ?  benazepril-hydrochlorthiazide (LOTENSIN HCT) 20-25 MG tablet, Take 1 tablet by mouth daily., Disp: , Rfl:  ?  CVS VITAMIN C 500 MG tablet, TAKE 1 TABLET BY MOUTH TWICE A DAY, Disp: 100 tablet, Rfl: 0 ?  ezetimibe (ZETIA) 10 MG tablet, Take 10 mg by mouth daily., Disp: , Rfl:  ?  hydrochlorothiazide (HYDRODIURIL) 12.5 MG tablet, Take 12.5 mg by mouth daily., Disp: , Rfl:  ?  lidocaine (LIDODERM) 5 %, Place 1 patch onto  the skin daily. Remove & Discard patch within 12 hours or as directed by MD, Disp: 10 patch, Rfl: 0 ?  Multiple Vitamin (MULTIVITAMIN) capsule, Take 1 capsule by mouth daily., Disp: , Rfl:  ?  pantoprazole (PROTONIX) 40 MG tablet, Take 40 mg by mouth 2 (two) times daily., Disp: , Rfl:  ?  REPATHA SURECLICK 294 MG/ML SOAJ, SMARTSIG:140 Milligram(s) SUB-Q Every 4 Weeks, Disp: , Rfl:  ?  thiamine (VITAMIN B-1) 100 MG tablet, TAKE 1 TABLET BY MOUTH EVERY DAY, Disp: 30 tablet, Rfl: 0: ? ? ?Allergies  ?Allergen Reactions   ? Crestor [Rosuvastatin Calcium] Swelling and Other (See Comments)  ?  Swelling of legs and muscle weakness  ? Zocor [Simvastatin] Swelling and Other (See Comments)  ?  Swelling of legs and muscle weakness  ?: ? ? ?Family History  ?Problem Relation Age of Onset  ? Alzheimer's disease Mother   ? Colon cancer Father   ?: ? ? ?Social History  ? ?Socioeconomic History  ? Marital status: Married  ?  Spouse name: Not on file  ? Number of children: Not on file  ? Years of education: Not on file  ? Highest education level: Not on file  ?Occupational History  ? Not on file  ?Tobacco Use  ? Smoking status: Former  ? Smokeless tobacco: Never  ?Vaping Use  ? Vaping Use: Never used  ?Substance and Sexual Activity  ? Alcohol use: Not Currently  ? Drug use: Not Currently  ? Sexual activity: Not on file  ?Other Topics Concern  ? Not on file  ?Social History Narrative  ? Not on file  ? ?Social Determinants of Health  ? ?Financial Resource Strain: Not on file  ?Food Insecurity: Not on file  ?Transportation Needs: Not on file  ?Physical Activity: Not on file  ?Stress: Not on file  ?Social Connections: Not on file  ?Intimate Partner Violence: Not on file  ?: ? ?Pertinent items are noted in HPI. ? ?Exam: ?Blood pressure (!) 150/70, pulse 61, temperature (!) 97.5 ?F (36.4 ?C), temperature source Temporal, resp. rate 16, weight 204 lb 8 oz (92.8 kg), SpO2 98 %. ?ECOG 1 ?General appearance: alert and cooperative appeared without distress. ?Head: atraumatic without any abnormalities. ?Eyes: conjunctivae/corneas clear. PERRL.  Sclera anicteric. ?Throat: lips, mucosa, and tongue normal; without oral thrush or ulcers. ?Resp: clear to auscultation bilaterally without rhonchi, wheezes or dullness to percussion. ?Cardio: regular rate and rhythm, S1, S2 normal, no murmur, click, rub or gallop ?GI: soft, non-tender; bowel sounds normal; no masses,  no organomegaly ?Skin: Skin color, texture, turgor normal. No rashes or lesions ?Lymph nodes:  Cervical, supraclavicular, and axillary nodes normal. ?Neurologic: Grossly normal without any motor, sensory or deep tendon reflexes. ?Musculoskeletal: No joint deformity or effusion. ? ? ?Assessment and Plan:  ? ?70 year old woman with: ? ?1.  Neutropenia and lymphocytosis noted in 2019.  Her work-up at that time was unrevealing with peripheral blood flow cytometry did not show any lymphoproliferative disorder and CT scan did not show any malignancy. ? ?Laboratory data in the last 4 years were personally reviewed and discussed with the patient.  Her absolute neutrophil count continues to be close to 1500 which indicate benign fluctuating neutropenia rather than a primary hematological disorder.  At this time, I do not recommend any further work-up including a bone marrow biopsy or any intervention.  She does not require any growth factor support.  Risk of infection remains very low. ? ?2.  History of deep  vein thrombosis: This was diagnosed in 2019 in the setting of hospitalization from a stroke.  She is off anticoagulation at this time.  I recommend continuing low-dose aspirin as she is taking right now. ? ?3.  Follow-up: I am happy to see her in the future as needed. ? ? ?45  minutes were dedicated to this visit. The time was spent on reviewing laboratory data, imaging studies, discussing differential diagnosis and answering questions regarding future plan. ? ? ? ?  A copy of this consult has been forwarded to the requesting physician. ? ?

## 2021-09-03 ENCOUNTER — Ambulatory Visit
Admission: RE | Admit: 2021-09-03 | Discharge: 2021-09-03 | Disposition: A | Discharge status: Home / Self Care | Payer: No Typology Code available for payment source | Source: Ambulatory Visit | Attending: Internal Medicine | Admitting: Internal Medicine

## 2021-09-03 DIAGNOSIS — I700 Atherosclerosis of aorta: Secondary | ICD-10-CM | POA: Diagnosis not present

## 2021-09-03 DIAGNOSIS — I10 Essential (primary) hypertension: Secondary | ICD-10-CM

## 2021-09-05 DIAGNOSIS — M85852 Other specified disorders of bone density and structure, left thigh: Secondary | ICD-10-CM | POA: Diagnosis not present

## 2021-09-05 DIAGNOSIS — M85851 Other specified disorders of bone density and structure, right thigh: Secondary | ICD-10-CM | POA: Diagnosis not present

## 2021-09-05 DIAGNOSIS — M8589 Other specified disorders of bone density and structure, multiple sites: Secondary | ICD-10-CM | POA: Diagnosis not present

## 2021-09-05 DIAGNOSIS — Z8673 Personal history of transient ischemic attack (TIA), and cerebral infarction without residual deficits: Secondary | ICD-10-CM | POA: Diagnosis not present

## 2021-09-05 DIAGNOSIS — E7800 Pure hypercholesterolemia, unspecified: Secondary | ICD-10-CM | POA: Diagnosis not present

## 2021-09-05 DIAGNOSIS — M85859 Other specified disorders of bone density and structure, unspecified thigh: Secondary | ICD-10-CM | POA: Diagnosis not present

## 2021-09-05 DIAGNOSIS — I10 Essential (primary) hypertension: Secondary | ICD-10-CM | POA: Diagnosis not present

## 2021-10-02 DIAGNOSIS — E7800 Pure hypercholesterolemia, unspecified: Secondary | ICD-10-CM | POA: Diagnosis not present

## 2021-10-02 DIAGNOSIS — I10 Essential (primary) hypertension: Secondary | ICD-10-CM | POA: Diagnosis not present

## 2021-10-02 DIAGNOSIS — Z8673 Personal history of transient ischemic attack (TIA), and cerebral infarction without residual deficits: Secondary | ICD-10-CM | POA: Diagnosis not present

## 2021-10-09 DIAGNOSIS — Z7902 Long term (current) use of antithrombotics/antiplatelets: Secondary | ICD-10-CM | POA: Diagnosis not present

## 2021-10-09 DIAGNOSIS — Z8673 Personal history of transient ischemic attack (TIA), and cerebral infarction without residual deficits: Secondary | ICD-10-CM | POA: Diagnosis not present

## 2021-10-09 DIAGNOSIS — I10 Essential (primary) hypertension: Secondary | ICD-10-CM | POA: Diagnosis not present

## 2021-10-09 DIAGNOSIS — E7800 Pure hypercholesterolemia, unspecified: Secondary | ICD-10-CM | POA: Diagnosis not present

## 2021-12-02 DIAGNOSIS — E7800 Pure hypercholesterolemia, unspecified: Secondary | ICD-10-CM | POA: Diagnosis not present

## 2021-12-02 DIAGNOSIS — I10 Essential (primary) hypertension: Secondary | ICD-10-CM | POA: Diagnosis not present

## 2021-12-02 DIAGNOSIS — E118 Type 2 diabetes mellitus with unspecified complications: Secondary | ICD-10-CM | POA: Diagnosis not present

## 2021-12-02 DIAGNOSIS — K2970 Gastritis, unspecified, without bleeding: Secondary | ICD-10-CM | POA: Diagnosis not present

## 2021-12-30 DIAGNOSIS — H524 Presbyopia: Secondary | ICD-10-CM | POA: Diagnosis not present

## 2021-12-30 DIAGNOSIS — H1789 Other corneal scars and opacities: Secondary | ICD-10-CM | POA: Diagnosis not present

## 2021-12-30 DIAGNOSIS — H25013 Cortical age-related cataract, bilateral: Secondary | ICD-10-CM | POA: Diagnosis not present

## 2021-12-30 DIAGNOSIS — E119 Type 2 diabetes mellitus without complications: Secondary | ICD-10-CM | POA: Diagnosis not present

## 2021-12-30 DIAGNOSIS — H52203 Unspecified astigmatism, bilateral: Secondary | ICD-10-CM | POA: Diagnosis not present

## 2021-12-30 DIAGNOSIS — H5213 Myopia, bilateral: Secondary | ICD-10-CM | POA: Diagnosis not present

## 2021-12-30 DIAGNOSIS — H2513 Age-related nuclear cataract, bilateral: Secondary | ICD-10-CM | POA: Diagnosis not present

## 2022-01-02 DIAGNOSIS — K2970 Gastritis, unspecified, without bleeding: Secondary | ICD-10-CM | POA: Diagnosis not present

## 2022-01-02 DIAGNOSIS — Z8673 Personal history of transient ischemic attack (TIA), and cerebral infarction without residual deficits: Secondary | ICD-10-CM | POA: Diagnosis not present

## 2022-01-02 DIAGNOSIS — E119 Type 2 diabetes mellitus without complications: Secondary | ICD-10-CM | POA: Diagnosis not present

## 2022-01-02 DIAGNOSIS — I10 Essential (primary) hypertension: Secondary | ICD-10-CM | POA: Diagnosis not present

## 2022-01-02 DIAGNOSIS — E7800 Pure hypercholesterolemia, unspecified: Secondary | ICD-10-CM | POA: Diagnosis not present

## 2022-01-02 DIAGNOSIS — Z7902 Long term (current) use of antithrombotics/antiplatelets: Secondary | ICD-10-CM | POA: Diagnosis not present

## 2022-01-02 DIAGNOSIS — E118 Type 2 diabetes mellitus with unspecified complications: Secondary | ICD-10-CM | POA: Diagnosis not present

## 2022-01-09 DIAGNOSIS — D709 Neutropenia, unspecified: Secondary | ICD-10-CM | POA: Diagnosis not present

## 2022-01-09 DIAGNOSIS — Z8673 Personal history of transient ischemic attack (TIA), and cerebral infarction without residual deficits: Secondary | ICD-10-CM | POA: Diagnosis not present

## 2022-01-09 DIAGNOSIS — E7800 Pure hypercholesterolemia, unspecified: Secondary | ICD-10-CM | POA: Diagnosis not present

## 2022-01-09 DIAGNOSIS — I10 Essential (primary) hypertension: Secondary | ICD-10-CM | POA: Diagnosis not present

## 2022-02-07 DIAGNOSIS — Z1231 Encounter for screening mammogram for malignant neoplasm of breast: Secondary | ICD-10-CM | POA: Diagnosis not present

## 2022-07-31 DIAGNOSIS — I10 Essential (primary) hypertension: Secondary | ICD-10-CM | POA: Diagnosis not present

## 2022-07-31 DIAGNOSIS — E119 Type 2 diabetes mellitus without complications: Secondary | ICD-10-CM | POA: Diagnosis not present

## 2022-08-06 DIAGNOSIS — I10 Essential (primary) hypertension: Secondary | ICD-10-CM | POA: Diagnosis not present

## 2022-08-06 DIAGNOSIS — Z0000 Encounter for general adult medical examination without abnormal findings: Secondary | ICD-10-CM | POA: Diagnosis not present

## 2022-08-06 DIAGNOSIS — I700 Atherosclerosis of aorta: Secondary | ICD-10-CM | POA: Diagnosis not present

## 2022-08-06 DIAGNOSIS — R3129 Other microscopic hematuria: Secondary | ICD-10-CM | POA: Diagnosis not present

## 2022-08-06 DIAGNOSIS — E118 Type 2 diabetes mellitus with unspecified complications: Secondary | ICD-10-CM | POA: Diagnosis not present

## 2022-08-06 DIAGNOSIS — K760 Fatty (change of) liver, not elsewhere classified: Secondary | ICD-10-CM | POA: Diagnosis not present

## 2022-08-06 DIAGNOSIS — N3000 Acute cystitis without hematuria: Secondary | ICD-10-CM | POA: Diagnosis not present

## 2022-08-06 DIAGNOSIS — K8020 Calculus of gallbladder without cholecystitis without obstruction: Secondary | ICD-10-CM | POA: Diagnosis not present

## 2022-08-06 DIAGNOSIS — Z8673 Personal history of transient ischemic attack (TIA), and cerebral infarction without residual deficits: Secondary | ICD-10-CM | POA: Diagnosis not present

## 2022-08-06 DIAGNOSIS — D709 Neutropenia, unspecified: Secondary | ICD-10-CM | POA: Diagnosis not present

## 2022-08-06 DIAGNOSIS — E7800 Pure hypercholesterolemia, unspecified: Secondary | ICD-10-CM | POA: Diagnosis not present

## 2022-08-20 DIAGNOSIS — R3129 Other microscopic hematuria: Secondary | ICD-10-CM | POA: Diagnosis not present

## 2022-08-20 DIAGNOSIS — E559 Vitamin D deficiency, unspecified: Secondary | ICD-10-CM | POA: Diagnosis not present

## 2022-08-20 DIAGNOSIS — Z8639 Personal history of other endocrine, nutritional and metabolic disease: Secondary | ICD-10-CM | POA: Diagnosis not present

## 2022-08-27 DIAGNOSIS — Z87898 Personal history of other specified conditions: Secondary | ICD-10-CM | POA: Diagnosis not present

## 2022-08-27 DIAGNOSIS — Z01818 Encounter for other preprocedural examination: Secondary | ICD-10-CM | POA: Diagnosis not present

## 2022-08-27 DIAGNOSIS — Z8673 Personal history of transient ischemic attack (TIA), and cerebral infarction without residual deficits: Secondary | ICD-10-CM | POA: Diagnosis not present

## 2022-08-27 DIAGNOSIS — Z86718 Personal history of other venous thrombosis and embolism: Secondary | ICD-10-CM | POA: Diagnosis not present

## 2022-09-02 DIAGNOSIS — E118 Type 2 diabetes mellitus with unspecified complications: Secondary | ICD-10-CM | POA: Diagnosis not present

## 2022-09-02 DIAGNOSIS — K2970 Gastritis, unspecified, without bleeding: Secondary | ICD-10-CM | POA: Diagnosis not present

## 2022-09-02 DIAGNOSIS — I10 Essential (primary) hypertension: Secondary | ICD-10-CM | POA: Diagnosis not present

## 2022-09-02 DIAGNOSIS — E7800 Pure hypercholesterolemia, unspecified: Secondary | ICD-10-CM | POA: Diagnosis not present

## 2022-09-24 ENCOUNTER — Other Ambulatory Visit: Payer: Self-pay | Admitting: Obstetrics and Gynecology

## 2022-10-08 ENCOUNTER — Telehealth: Payer: Self-pay | Admitting: *Deleted

## 2022-10-08 NOTE — Telephone Encounter (Signed)
Spoke with the patient regarding the referral to GYN oncology. Patient scheduled as new patient with Dr Tucker on  6/14 at 9 am.  Patient given an arrival time of 8:30 am.  Explained to the patient the the doctor will perform a pelvic exam at this visit. Patient given the policy that only one visitor allowed and that visitor must be over 16 yrs are allowed in the Cancer Center. Patient given the address/phone number for the clinic and that the center offers free valet service. Patient aware that masks are option.  

## 2022-10-10 ENCOUNTER — Other Ambulatory Visit: Payer: Self-pay | Admitting: Gynecologic Oncology

## 2022-10-10 ENCOUNTER — Telehealth: Payer: Self-pay | Admitting: Oncology

## 2022-10-10 ENCOUNTER — Telehealth: Payer: Self-pay | Admitting: *Deleted

## 2022-10-10 DIAGNOSIS — C549 Malignant neoplasm of corpus uteri, unspecified: Secondary | ICD-10-CM

## 2022-10-10 NOTE — Telephone Encounter (Signed)
Per Dr Pricilla Holm scheduled a CT scan on 6/11 at 4 pm arrive at 2 pm. Spoke with the patient and her husband; gave them the date/time and instructions

## 2022-10-10 NOTE — Telephone Encounter (Signed)
[facility] and advised her that we are starting to work on appointments for her to expedite her care.  Advised that we will be scheduling a CT scan to be done before she sees Dr. Pricilla Holm and that we are going to hold surgery time on 11/05/22.  Discussed that preop at the hospital may call her for an appointment and to schedule it after she sees Dr. Pricilla Holm.  She verbalized understanding and agreement.

## 2022-10-14 ENCOUNTER — Encounter (HOSPITAL_BASED_OUTPATIENT_CLINIC_OR_DEPARTMENT_OTHER): Payer: Self-pay

## 2022-10-14 ENCOUNTER — Ambulatory Visit (HOSPITAL_BASED_OUTPATIENT_CLINIC_OR_DEPARTMENT_OTHER)
Admission: RE | Admit: 2022-10-14 | Discharge: 2022-10-14 | Disposition: A | Discharge status: Home / Self Care | Payer: Medicare Other | Source: Ambulatory Visit | Attending: Gynecologic Oncology | Admitting: Gynecologic Oncology

## 2022-10-14 DIAGNOSIS — C549 Malignant neoplasm of corpus uteri, unspecified: Secondary | ICD-10-CM

## 2022-10-14 DIAGNOSIS — K8020 Calculus of gallbladder without cholecystitis without obstruction: Secondary | ICD-10-CM | POA: Diagnosis not present

## 2022-10-14 MED ORDER — IOHEXOL 300 MG/ML  SOLN
100.0000 mL | Freq: Once | INTRAMUSCULAR | Status: AC | PRN
  Administered 2022-10-14: 100 mL via INTRAVENOUS

## 2022-10-16 ENCOUNTER — Encounter: Payer: Self-pay | Admitting: Gynecologic Oncology

## 2022-10-16 ENCOUNTER — Telehealth: Payer: Self-pay | Admitting: Surgery

## 2022-10-16 NOTE — Progress Notes (Signed)
GYNECOLOGIC ONCOLOGY NEW PATIENT CONSULTATION   Patient Name: Maria Avila  Patient Age: 72 y.o. 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  I spent some time reviewing with the patient and her husband recent biopsy results showing serous uterine cancer as well as her CT scan.  We looked at these pictures together.  Unfortunately, her CT scan shows evidence of metastatic disease with carcinomatosis as well as adenopathy.  In the setting of metastatic disease, we discussed change in treatment plan from surgery to initiation of chemotherapy.  Discussed rationale for neoadjuvant chemotherapy.  I have asked my office to request HER2 testing as well as MMR on the patient's endometrial biopsy.  Based on recently published GY018 and the RUBY trial, I have recommended addition of immunotherapy to carboplatin and paclitaxel if the patient's HER2 testing is negative.  This is based on PFS survival improvement seen within the subgroup of patients whose tumors were p53 mutated.  If the patient's tumor is positive for HER2, then would recommend addition of Herceptin rather than immunotherapy.  We discussed the plan for 3 cycles of neoadjuvant chemotherapy followed by interval imaging to assess for feasibility of surgical debulking.  The patient will see Dr. Bertis Ruddy next week.  The patient was given some information about carboplatin, paclitaxel, Herceptin, as well as pembrolizumab.  We discussed some of the side effects of all of these chemotherapy agents.  Specifically, we discussed the risk of neuropathy in the setting of her balance after prior stroke.  Order for port was placed today.  CA125 was drawn today as this can be a marker for some uterine serous tumors.   CBC and CMP were drawn today in anticipation of upcoming  start to chemotherapy.  A copy of this note was sent to the patient's referring provider.   75 minutes of total time was spent for this patient encounter, including preparation, face-to-face counseling with the patient and coordination of care, and documentation of the encounter.  Eugene Garnet, MD  Division of Gynecologic Oncology  Department of Obstetrics and Gynecology  [facility]  ___________________________________________  Chief Complaint: Chief Complaint  Patient presents with   endometrial cancer    History of Present Illness:  Maria Avila is a 72 y.o. y.o. female who is seen in consultation at the request of Dr. Henderson Cloud for an evaluation of endometrial cancer.  The patient presented initially with postmenopausal bleeding.  She had some bleeding in December 2023 for about a week with mild cramping.  Spotting had continued since that time.  Her history is notable for IUD removal in 2019 with benign endometrial polyp noted on endometrial sampling.  [facility] shows a uterus measuring 7.6 x 4.1 x 3.8 cm.  Post saline infusion revealed a 3.2 x 1.1 cm intracavitary lesion.  Bilateral ovaries normal in appearance. Endometrial biopsy on 09/24/2022 showed high-grade serous carcinoma, p53 mutated.  CT of the chest, abdomen, and pelvis performed on 6/11 shows an ill-defined thickening of the endometrial cavity without discretely visualized mass.  Enlarged right iliac and pelvic sidewall lymph nodes as well as enlarged epicardial lymph nodes or soft tissue nodules.  There are scattered peritoneal and omental soft tissue nodularity, particularly notable in the left upper quadrant.  Soft tissue Madding about the distal sigmoid colon and rectum  is seen in the low pelvis.  Constellations of findings is consistent with metastatic disease.  Small right pleural effusion is nonspecific although suspicious for malignant effusion.   Pancolonic diverticulosis, severe in the descending and sigmoid colon.  Today, the patient comes in with her husband.  Her last episode of bleeding was last month, lasted about 1 week.  She notes several days of heavier bleeding requiring the use of 3 pads, not completely saturated.  She had her first episode of bleeding for about a week in December.  She had some spotting in January and then has had intermittent spotting until bleeding episode last month.  She notes some intermittent cramping and pelvic pain that does not seem to be related to her bleeding.  She uses Tylenol as needed with good relief.  She endorses a good appetite without nausea or emesis.  She endorses some early satiety although this is at baseline for her which she attributes to her gallbladder.  She denies any recent weight changes.  She endorses regular bowel and bladder function.  Her history is notable for stroke in 2019.  She has some balance issues but otherwise recovered fully.  She walks with a cane.  PAST MEDICAL HISTORY:  Past Medical History:  Diagnosis Date   Colitis    Endometrial cancer (HCC)    Hyperlipidemia    Hypertension    Malnutrition (HCC)    Stroke (HCC) 2019   balance still affected     PAST SURGICAL HISTORY:  Past Surgical History:  Procedure Laterality Date   BIOPSY  02/26/2018   Procedure: BIOPSY;  Surgeon: Charlott Rakes, MD;  Location: [facility] ENDOSCOPY;  Service: Endoscopy;;   COLONOSCOPY WITH PROPOFOL N/A 02/26/2018   Procedure: COLONOSCOPY WITH PROPOFOL;  Surgeon: Charlott Rakes, MD;  Location: [facility] ENDOSCOPY;  Service: Endoscopy;  Laterality: N/A;   ESOPHAGOGASTRODUODENOSCOPY (EGD) WITH PROPOFOL N/A 02/26/2018   Procedure: ESOPHAGOGASTRODUODENOSCOPY (EGD) WITH PROPOFOL;  Surgeon: Charlott Rakes, MD;  Location: [facility] ENDOSCOPY;  Service: Endoscopy;  Laterality: N/A;   NO PAST SURGERIES     UNCERTAIN OF NAME OF SURGERY    OB/GYN HISTORY:  OB History  Gravida Para Term Preterm AB  Living  4 4       4   SAB IAB Ectopic Multiple Live Births               # Outcome Date GA Lbr Len/2nd Weight Sex Delivery Anes PTL Lv  4 Para           3 Para           2 Para           1 Para             No LMP recorded. Patient is postmenopausal.  Age at menarche: 87 Age at menopause: Does not remember but thinks approximately 18 Hx of HRT: Denies Hx of STDs: Denies Last pap: 2022 History of abnormal pap smears: Denies  SCREENING STUDIES:  Last mammogram: 2023  Last colonoscopy: 2019  MEDICATIONS: Outpatient Encounter Medications as of 10/17/2022  Medication Sig   acetaminophen (TYLENOL) 325 MG tablet Take 1-2 tablets (325-650 mg total) by mouth every 4 (four) hours as needed for mild pain.   aspirin EC 81 MG tablet Take 81 mg by mouth daily. OTC   benazepril-hydrochlorthiazide (LOTENSIN HCT) 20-25 MG tablet Take 1 tablet by mouth daily.   [employer] VITAMIN C 500 MG tablet TAKE 1 TABLET BY MOUTH TWICE A DAY   ezetimibe (ZETIA)  10 MG tablet Take 10 mg by mouth daily.   hydrochlorothiazide (HYDRODIURIL) 12.5 MG tablet Take 12.5 mg by mouth daily.   Multiple Vitamin (MULTIVITAMIN) capsule Take 1 capsule by mouth daily.   thiamine (VITAMIN B-1) 100 MG tablet TAKE 1 TABLET BY MOUTH EVERY DAY   pantoprazole (PROTONIX) 40 MG tablet Take 40 mg by mouth 2 (two) times daily. (Patient not taking: Reported on 10/16/2022)   [DISCONTINUED] lidocaine (LIDODERM) 5 % Place 1 patch onto the skin daily. Remove & Discard patch within 12 hours or as directed by MD (Patient not taking: Reported on 10/16/2022)   [DISCONTINUED] REPATHA SURECLICK 140 MG/ML SOAJ SMARTSIG:140 Milligram(s) SUB-Q Every 4 Weeks (Patient not taking: Reported on 10/16/2022)   No facility-administered encounter medications on file as of 10/17/2022.    ALLERGIES:  Allergies  Allergen Reactions   Crestor [Rosuvastatin Calcium] Swelling and Other (See Comments)    Swelling of legs and muscle weakness   Zocor [Simvastatin]  Swelling and Other (See Comments)    Swelling of legs and muscle weakness     FAMILY HISTORY:  Family History  Problem Relation Age of Onset   Alzheimer's disease Mother    Colon cancer Father    Endometrial cancer Sister        gyn, unsure type   Breast cancer Neg Hx    Ovarian cancer Neg Hx    Prostate cancer Neg Hx    Pancreatic cancer Neg Hx      SOCIAL HISTORY:  Social Connections: Not on file    REVIEW OF SYSTEMS:  Denies appetite changes, fevers, chills, fatigue, unexplained weight changes. Denies hearing loss, neck lumps or masses, mouth sores, ringing in ears or voice changes. Denies cough or wheezing.  Denies shortness of breath. Denies chest pain or palpitations. Denies leg swelling. Denies abdominal distention, pain, blood in stools, constipation, diarrhea, nausea, vomiting, or early satiety. Denies pain with intercourse, dysuria, frequency, hematuria or incontinence. Denies hot flashes or vaginal discharge.   Denies joint pain, back pain or muscle pain/cramps. Denies itching, rash, or wounds. Denies dizziness, headaches, numbness or seizures. Denies swollen lymph nodes or glands, denies easy bruising or bleeding. Denies anxiety, depression, confusion, or decreased concentration.  Physical Exam:  Vital Signs for this encounter:  Blood pressure (!) 140/76, pulse (!) 55, temperature 98.4 F (36.9 C), temperature source Oral, resp. rate 16, height 5' 2.6" (1.59 m), weight 194 lb 3.2 oz (88.1 kg), SpO2 100 %. Body mass index is 34.84 kg/m. General: Alert, oriented, no acute distress.  HEENT: Normocephalic, atraumatic. Sclera anicteric.  Chest: Clear to auscultation bilaterally. No wheezes, rhonchi, or rales. Cardiovascular: Regular rate and rhythm, no murmurs, rubs, or gallops.  Abdomen: Obese. Normoactive bowel sounds. Soft, nondistended, nontender to palpation. No masses or hepatosplenomegaly appreciated. No palpable fluid wave.  Extremities: Grossly normal  range of motion. Warm, well perfused. No edema bilaterally.  Skin: No rashes or lesions.  Lymphatics: No cervical, supraclavicular, or inguinal adenopathy.  GU:  Normal external female genitalia. No lesions. No discharge or bleeding.             Bladder/urethra:  No lesions or masses, well supported bladder             Vagina: No lesions, no blood noted within the vaginal vault.             Cervix: Normal appearing, no lesions.             Uterus: Mildly enlarged, mobile, no parametrial  involvement or nodularity.             Adnexa: No masses.  Rectal: Deferred.  LABORATORY AND RADIOLOGIC DATA:  Outside medical records were reviewed to synthesize the above history, along with the history and physical obtained during the visit.   Lab Results  Component Value Date   WBC 4.1 10/17/2022   HGB 13.0 10/17/2022   HCT 38.1 10/17/2022   PLT 217 10/17/2022   GLUCOSE 100 (H) 10/17/2022   CHOL 93 03/10/2018   TRIG 81 03/10/2018   HDL 15 (L) 03/10/2018   LDLCALC 62 03/10/2018   ALT 13 10/17/2022   AST 16 10/17/2022   NA 139 10/17/2022   K 3.7 10/17/2022   CL 105 10/17/2022   CREATININE 0.79 10/17/2022   BUN 13 10/17/2022   CO2 28 10/17/2022   TSH 1.520 02/17/2018   INR 1.45 03/08/2018   HGBA1C 5.1 03/10/2018

## 2022-10-16 NOTE — Telephone Encounter (Signed)
Called from [facility] alerting our office to CT abd/pelvis results. MD notified.

## 2022-10-17 ENCOUNTER — Encounter: Payer: Self-pay | Admitting: Gynecologic Oncology

## 2022-10-17 ENCOUNTER — Telehealth: Payer: Self-pay | Admitting: Oncology

## 2022-10-17 ENCOUNTER — Inpatient Hospital Stay: Payer: Medicare Other | Attending: Gynecologic Oncology | Admitting: Gynecologic Oncology

## 2022-10-17 ENCOUNTER — Telehealth: Payer: Self-pay | Admitting: *Deleted

## 2022-10-17 ENCOUNTER — Inpatient Hospital Stay (HOSPITAL_BASED_OUTPATIENT_CLINIC_OR_DEPARTMENT_OTHER): Payer: Medicare Other | Admitting: Hematology and Oncology

## 2022-10-17 ENCOUNTER — Encounter: Payer: Self-pay | Admitting: Hematology and Oncology

## 2022-10-17 ENCOUNTER — Other Ambulatory Visit: Payer: Self-pay

## 2022-10-17 ENCOUNTER — Inpatient Hospital Stay: Payer: Medicare Other | Admitting: Gynecologic Oncology

## 2022-10-17 ENCOUNTER — Encounter: Payer: Self-pay | Admitting: Oncology

## 2022-10-17 VITALS — BP 137/65 | HR 83 | Temp 99.2°F | Resp 18 | Ht 62.6 in | Wt 196.0 lb

## 2022-10-17 VITALS — BP 140/76 | HR 55 | Temp 98.4°F | Resp 16 | Ht 62.6 in | Wt 194.2 lb

## 2022-10-17 DIAGNOSIS — Z800 Family history of malignant neoplasm of digestive organs: Secondary | ICD-10-CM | POA: Diagnosis not present

## 2022-10-17 DIAGNOSIS — Z5111 Encounter for antineoplastic chemotherapy: Secondary | ICD-10-CM | POA: Insufficient documentation

## 2022-10-17 DIAGNOSIS — Z8673 Personal history of transient ischemic attack (TIA), and cerebral infarction without residual deficits: Secondary | ICD-10-CM | POA: Insufficient documentation

## 2022-10-17 DIAGNOSIS — I10 Essential (primary) hypertension: Secondary | ICD-10-CM | POA: Diagnosis not present

## 2022-10-17 DIAGNOSIS — R971 Elevated cancer antigen 125 [CA 125]: Secondary | ICD-10-CM | POA: Diagnosis not present

## 2022-10-17 DIAGNOSIS — C549 Malignant neoplasm of corpus uteri, unspecified: Secondary | ICD-10-CM

## 2022-10-17 DIAGNOSIS — C55 Malignant neoplasm of uterus, part unspecified: Secondary | ICD-10-CM | POA: Insufficient documentation

## 2022-10-17 DIAGNOSIS — Z5112 Encounter for antineoplastic immunotherapy: Secondary | ICD-10-CM | POA: Diagnosis not present

## 2022-10-17 DIAGNOSIS — E669 Obesity, unspecified: Secondary | ICD-10-CM

## 2022-10-17 DIAGNOSIS — K529 Noninfective gastroenteritis and colitis, unspecified: Secondary | ICD-10-CM | POA: Insufficient documentation

## 2022-10-17 DIAGNOSIS — Z7982 Long term (current) use of aspirin: Secondary | ICD-10-CM | POA: Diagnosis not present

## 2022-10-17 DIAGNOSIS — E785 Hyperlipidemia, unspecified: Secondary | ICD-10-CM | POA: Diagnosis not present

## 2022-10-17 DIAGNOSIS — Z87891 Personal history of nicotine dependence: Secondary | ICD-10-CM | POA: Diagnosis not present

## 2022-10-17 DIAGNOSIS — Z8049 Family history of malignant neoplasm of other genital organs: Secondary | ICD-10-CM | POA: Diagnosis not present

## 2022-10-17 DIAGNOSIS — C800 Disseminated malignant neoplasm, unspecified: Secondary | ICD-10-CM

## 2022-10-17 DIAGNOSIS — Z79899 Other long term (current) drug therapy: Secondary | ICD-10-CM | POA: Diagnosis not present

## 2022-10-17 DIAGNOSIS — R599 Enlarged lymph nodes, unspecified: Secondary | ICD-10-CM | POA: Insufficient documentation

## 2022-10-17 LAB — CMP (CANCER CENTER ONLY)
ALT: 13 U/L (ref 0–44)
AST: 16 U/L (ref 15–41)
Albumin: 4 g/dL (ref 3.5–5.0)
Alkaline Phosphatase: 63 U/L (ref 38–126)
Anion gap: 6 (ref 5–15)
BUN: 13 mg/dL (ref 8–23)
CO2: 28 mmol/L (ref 22–32)
Calcium: 9.5 mg/dL (ref 8.9–10.3)
Chloride: 105 mmol/L (ref 98–111)
Creatinine: 0.79 mg/dL (ref 0.44–1.00)
GFR, Estimated: >60 mL/min (ref >60)
Glucose, Bld: 100 mg/dL — ABNORMAL HIGH (ref 70–99)
Potassium: 3.7 mmol/L (ref 3.5–5.1)
Sodium: 139 mmol/L (ref 135–145)
Total Bilirubin: 0.5 mg/dL (ref 0.3–1.2)
Total Protein: 6.8 g/dL (ref 6.5–8.1)

## 2022-10-17 LAB — CBC WITH DIFFERENTIAL (CANCER CENTER ONLY)
Abs Immature Granulocytes: 0.01 10*3/uL (ref 0.00–0.07)
Basophils Absolute: 0 10*3/uL (ref 0.0–0.1)
Basophils Relative: 1 %
Eosinophils Absolute: 0.2 10*3/uL (ref 0.0–0.5)
Eosinophils Relative: 5 %
HCT: 38.1 % (ref 36.0–46.0)
Hemoglobin: 13 g/dL (ref 12.0–15.0)
Immature Granulocytes: 0 %
Lymphocytes Relative: 51 %
Lymphs Abs: 2.1 10*3/uL (ref 0.7–4.0)
MCH: 33.6 pg (ref 26.0–34.0)
MCHC: 34.1 g/dL (ref 30.0–36.0)
MCV: 98.4 fL (ref 80.0–100.0)
Monocytes Absolute: 0.3 10*3/uL (ref 0.1–1.0)
Monocytes Relative: 8 %
Neutro Abs: 1.4 10*3/uL — ABNORMAL LOW (ref 1.7–7.7)
Neutrophils Relative %: 35 %
Platelet Count: 217 10*3/uL (ref 150–400)
RBC: 3.87 MIL/uL (ref 3.87–5.11)
RDW: 12.6 % (ref 11.5–15.5)
WBC Count: 4.1 10*3/uL (ref 4.0–10.5)
nRBC: 0 % (ref 0.0–0.2)

## 2022-10-17 NOTE — Progress Notes (Signed)
[facility] CONSULT NOTE  Patient Care Team: Merri Brunette, MD as PCP - General (Internal Medicine)  ASSESSMENT & PLAN:  Uterine cancer [facility]) I have reviewed her CT imaging The patient has stage IV disease However, the disease burden is not significant and she can still be cured with neoadjuvant chemotherapy followed by surgery Molecular testing is pending If she is HER2 positive, we will add trastuzumab If she is HER2 negative, I will add dostarlimab We discussed the risk, benefits, side effects of chemotherapy using combination of chemotherapy with carboplatin and paclitaxel with or without additional immunotherapy and she is interested to pursue this We will schedule port placement next week As soon as molecular test results are back, we will call her to finalize treatment decision She would qualify for genetic testing as well as clinical trial but the patient appears overwhelmed with information today I gave her prescription for cranial prosthesis  History of stroke She has mild residual deficit with some balance issue and slight weakness on the left upper extremity but overall is very functional at home She will continue medical management and risk factor modification  No orders of the defined types were placed in this encounter.   The total time spent in the appointment was 60 minutes encounter with patients including review of chart and various tests results, discussions about plan of care and coordination of care plan   All questions were answered. The patient knows to call the clinic with any problems, questions or concerns. No barriers to learning was detected.  Artis Delay, MD 6/14/20241:34 PM  CHIEF COMPLAINTS/PURPOSE OF CONSULTATION:  Metastatic uterine cancer, for further management  HISTORY OF PRESENTING ILLNESS:  Maria Avila 72 y.o. female is here because of metastatic uterine cancer She is here accompanied by her husband, Jerilynn Som They have been  married since 1969 and she has 4 children, 3 sons and 1 daughter She is a retired Midwife from the [employer] She was diagnosed after presentation with postmenopausal bleeding The patient have significant history of alcoholism and had stroke in 2019 She has very mild residual deficit from a stroke but otherwise in good health Currently, she has occasional pelvic cramps but denies pain or excessive vaginal bleeding  I have reviewed her chart and materials related to her cancer extensively and collaborated history with the patient. Summary of oncologic history is as follows: Oncology History  Uterine cancer (HCC)  08/12/2022 Initial Diagnosis   She presented with PMB   09/24/2022 Pathology Results   Diagnosis 1. Endometrium, curettage - HIGH GRADE SEROUS CARCINOMA (SEE NOTE) 2. Endometrium, resection - HIGH GRADE SEROUS CARCINOMA (SEE NOTE) Diagnosis Note 1. - immunohistochemical stain reveal tumor cells are positive for p16. P53 shows strong expression consistent with mutant type. This case was reviewed with Dr. Reynolds Bowl who agrees with the diagnosis   10/14/2022 Imaging   1. Ill-defined fullness of the uterus and endometrial cavity without discretely visualized mass, in keeping with patient's known endometrial malignancy. 2. Enlarged right iliac and pelvic sidewall lymph nodes, as well as enlarged epicardial lymph nodes or soft tissue nodules. 3. Scattered peritoneal and omental soft tissue nodularity, particularly notable in the left upper quadrant. 4. Soft tissue matting about the distal sigmoid colon and rectum in the low pelvis. 5. Constellation of findings is consistent with nodal, peritoneal, and omental metastatic disease. 6. Small right pleural effusion, nonspecific although modestly suspicious for a malignant effusion, however without directly visualized pleural mass or nodularity. 7. Pancolonic diverticulosis, severe  in the descending and sigmoid colon. 8. Cholelithiasis. 9.  Coronary artery disease.   10/17/2022 Initial Diagnosis   Uterine cancer (HCC)   10/17/2022 Cancer Staging   Staging form: Corpus Uteri - Carcinoma and Carcinosarcoma, AJCC 8th Edition - Clinical stage from 10/17/2022: FIGO Stage IVB (cT1b, cN1, cM1) - Signed by Artis Delay, MD on 10/17/2022 Stage prefix: Initial diagnosis     MEDICAL HISTORY:  Past Medical History:  Diagnosis Date   Colitis    Endometrial cancer (HCC)    Hyperlipidemia    Hypertension    Malnutrition (HCC)    Stroke (HCC) 2019   balance still affected    SURGICAL HISTORY: Past Surgical History:  Procedure Laterality Date   BIOPSY  02/26/2018   Procedure: BIOPSY;  Surgeon: Charlott Rakes, MD;  Location: [facility] ENDOSCOPY;  Service: Endoscopy;;   COLONOSCOPY WITH PROPOFOL N/A 02/26/2018   Procedure: COLONOSCOPY WITH PROPOFOL;  Surgeon: Charlott Rakes, MD;  Location: [facility] ENDOSCOPY;  Service: Endoscopy;  Laterality: N/A;   ESOPHAGOGASTRODUODENOSCOPY (EGD) WITH PROPOFOL N/A 02/26/2018   Procedure: ESOPHAGOGASTRODUODENOSCOPY (EGD) WITH PROPOFOL;  Surgeon: Charlott Rakes, MD;  Location: [facility] ENDOSCOPY;  Service: Endoscopy;  Laterality: N/A;   NO PAST SURGERIES     UNCERTAIN OF NAME OF SURGERY    SOCIAL HISTORY: Social History   Socioeconomic History   Marital status: Married    Spouse name: Not on file   Number of children: Not on file   Years of education: Not on file   Highest education level: Not on file  Occupational History   Not on file  Tobacco Use   Smoking status: Former    Types: Cigarettes    Quit date: 57    Years since quitting: 30.4   Smokeless tobacco: Never  Vaping Use   Vaping Use: Never used  Substance and Sexual Activity   Alcohol use: Yes    Comment: occ wine   Drug use: Not Currently   Sexual activity: Yes  Other Topics Concern   Not on file  Social History Narrative   Not on file   Social Determinants of Health   Financial Resource Strain: Not on file  Food  Insecurity: No Food Insecurity (10/16/2022)   Hunger Vital Sign    Worried About Running Out of Food in the Last Year: Never true    Ran Out of Food in the Last Year: Never true  Transportation Needs: No Transportation Needs (10/16/2022)   PRAPARE - Administrator, Civil Service (Medical): No    Lack of Transportation (Non-Medical): No  Physical Activity: Not on file  Stress: Not on file  Social Connections: Not on file  Intimate Partner Violence: Not on file    FAMILY HISTORY: Family History  Problem Relation Age of Onset   Alzheimer's disease Mother    Colon cancer Father    Endometrial cancer Sister        gyn, unsure type   Breast cancer Neg Hx    Ovarian cancer Neg Hx    Prostate cancer Neg Hx    Pancreatic cancer Neg Hx     ALLERGIES:  is allergic to crestor [rosuvastatin calcium] and zocor [simvastatin].  MEDICATIONS:  Current Outpatient Medications  Medication Sig Dispense Refill   acetaminophen (TYLENOL) 325 MG tablet Take 1-2 tablets (325-650 mg total) by mouth every 4 (four) hours as needed for mild pain.     aspirin EC 81 MG tablet Take 81 mg by mouth daily. OTC  benazepril-hydrochlorthiazide (LOTENSIN HCT) 20-25 MG tablet Take 1 tablet by mouth daily.     [employer] VITAMIN C 500 MG tablet TAKE 1 TABLET BY MOUTH TWICE A DAY 100 tablet 0   ezetimibe (ZETIA) 10 MG tablet Take 10 mg by mouth daily.     hydrochlorothiazide (HYDRODIURIL) 12.5 MG tablet Take 12.5 mg by mouth daily.     Multiple Vitamin (MULTIVITAMIN) capsule Take 1 capsule by mouth daily.     pantoprazole (PROTONIX) 40 MG tablet Take 40 mg by mouth 2 (two) times daily. (Patient not taking: Reported on 10/16/2022)     thiamine (VITAMIN B-1) 100 MG tablet TAKE 1 TABLET BY MOUTH EVERY DAY 30 tablet 0   No current facility-administered medications for this visit.    REVIEW OF SYSTEMS:   Constitutional: Denies fevers, chills or abnormal night sweats Eyes: Denies blurriness of vision, double vision  or watery eyes Ears, nose, mouth, throat, and face: Denies mucositis or sore throat Respiratory: Denies cough, dyspnea or wheezes Cardiovascular: Denies palpitation, chest discomfort or lower extremity swelling Gastrointestinal:  Denies nausea, heartburn or change in bowel habits Skin: Denies abnormal skin rashes Lymphatics: Denies new lymphadenopathy or easy bruising Behavioral/Psych: Mood is stable, no new changes  All other systems were reviewed with the patient and are negative.  PHYSICAL EXAMINATION: ECOG PERFORMANCE STATUS: 1 - Symptomatic but completely ambulatory  Vitals:   10/17/22 1250  BP: 137/65  Pulse: 83  Resp: 18  Temp: 99.2 F (37.3 C)  SpO2: 97%   Filed Weights   10/17/22 1250  Weight: 196 lb (88.9 kg)    GENERAL:alert, no distress and comfortable SKIN: skin color, texture, turgor are normal, no rashes or significant lesions EYES: normal, conjunctiva are pink and non-injected, sclera clear OROPHARYNX:no exudate, no erythema and lips, buccal mucosa, and tongue normal  NECK: supple, thyroid normal size, non-tender, without nodularity LYMPH:  no palpable lymphadenopathy in the cervical, axillary or inguinal LUNGS: clear to auscultation and percussion with normal breathing effort HEART: regular rate & rhythm and no murmurs and no lower extremity edema ABDOMEN:abdomen soft, non-tender and normal bowel sounds Musculoskeletal:no cyanosis of digits and no clubbing  PSYCH: alert & oriented x 3 with fluent speech NEURO: no focal motor/sensory deficits  LABORATORY DATA:  I have reviewed the data as listed Lab Results  Component Value Date   WBC 4.1 10/17/2022   HGB 13.0 10/17/2022   HCT 38.1 10/17/2022   MCV 98.4 10/17/2022   PLT 217 10/17/2022   Recent Labs    10/17/22 1034  NA 139  K 3.7  CL 105  CO2 28  GLUCOSE 100*  BUN 13  CREATININE 0.79  CALCIUM 9.5  GFRNONAA >60  PROT 6.8  ALBUMIN 4.0  AST 16  ALT 13  ALKPHOS 63  BILITOT 0.5     RADIOGRAPHIC STUDIES: I have personally reviewed the radiological images as listed and agreed with the findings in the report. CT CHEST ABDOMEN PELVIS W CONTRAST  Result Date: 10/16/2022 CLINICAL DATA:  Newly diagnosed high-grade serous uterine/endometrial cancer, evaluate for metastatic disease * Tracking Code: BO * EXAM: CT CHEST, ABDOMEN, AND PELVIS WITH CONTRAST TECHNIQUE: Multidetector CT imaging of the chest, abdomen and pelvis was performed following the standard protocol during bolus administration of intravenous contrast. RADIATION DOSE REDUCTION: This exam was performed according to the departmental dose-optimization program which includes automated exposure control, adjustment of the mA and/or kV according to patient size and/or use of iterative reconstruction technique. CONTRAST:  OMNIPAQUE IOHEXOL  300 MG/ML SOLN additional oral enteric contrast COMPARISON:  CT chest, 03/12/2018, CTA abdomen and pelvis, 02/05/2018 FINDINGS: CT CHEST FINDINGS Cardiovascular: Aortic atherosclerosis. Mild cardiomegaly. Left coronary artery calcifications. No pericardial effusion. Mediastinum/Nodes: Enlarged epicardial lymph nodes or soft tissue nodules, largest on the right measuring up to 1.5 x 0.8 cm (series 2, image 38). No other enlarged mediastinal, hilar, or axillary lymph nodes. Thyroid gland, trachea, and esophagus demonstrate no significant findings. Lungs/Pleura: Dependent bibasilar scarring or atelectasis. Small right pleural effusion. Musculoskeletal: No chest wall abnormality. No acute osseous findings. CT ABDOMEN PELVIS FINDINGS Hepatobiliary: No solid liver abnormality is seen. Small gallstones. No gallbladder wall thickening, or biliary dilatation. Pancreas: Unremarkable. No pancreatic ductal dilatation or surrounding inflammatory changes. Spleen: Normal in size without significant abnormality. Adrenals/Urinary Tract: Adrenal glands are unremarkable. Kidneys are normal, without renal calculi,  solid lesion, or hydronephrosis. Bladder is unremarkable. Stomach/Bowel: Stomach is within normal limits. Appendix not clearly visualized. Pancolonic diverticulosis, severe in the descending and sigmoid colon. Soft tissue matting about the distal sigmoid colon and rectum in the low pelvis (series 2, image 95). Vascular/Lymphatic: Aortic atherosclerosis. Enlarged right iliac and pelvic sidewall lymph nodes, largest external iliac node measuring 2.3 x 1.4 cm (series 2, image 94). Reproductive: Ill-defined fullness of the uterus and endometrial cavity without discretely visualized mass (series 6, image 113). Normal postmenopausal ovaries. Other: No abdominal wall hernia or abnormality. Scattered peritoneal and omental soft tissue nodularity, particularly notable in the left upper quadrant (series 2, image 73). Musculoskeletal: No acute osseous findings. IMPRESSION: 1. Ill-defined fullness of the uterus and endometrial cavity without discretely visualized mass, in keeping with patient's known endometrial malignancy. 2. Enlarged right iliac and pelvic sidewall lymph nodes, as well as enlarged epicardial lymph nodes or soft tissue nodules. 3. Scattered peritoneal and omental soft tissue nodularity, particularly notable in the left upper quadrant. 4. Soft tissue matting about the distal sigmoid colon and rectum in the low pelvis. 5. Constellation of findings is consistent with nodal, peritoneal, and omental metastatic disease. 6. Small right pleural effusion, nonspecific although modestly suspicious for a malignant effusion, however without directly visualized pleural mass or nodularity. 7. Pancolonic diverticulosis, severe in the descending and sigmoid colon. 8. Cholelithiasis. 9. Coronary artery disease. These results will be called to the ordering clinician or representative by the Radiologist Assistant, and communication documented in the PACS or [employer]. Aortic Atherosclerosis (ICD10-I70.0). Electronically  Signed   By: Jearld Lesch M.D.   On: 10/16/2022 07:06

## 2022-10-17 NOTE — Telephone Encounter (Signed)
-----   Message from Carver Fila, MD sent at 10/17/2022 12:49 PM EDT ----- Would you mind calling to let her know labs look good? CA-125 won't be back until tomorrow but CBC and CMP are normal. Thank you!

## 2022-10-17 NOTE — Assessment & Plan Note (Signed)
I have reviewed her CT imaging The patient has stage IV disease However, the disease burden is not significant and she can still be cured with neoadjuvant chemotherapy followed by surgery Molecular testing is pending If she is HER2 positive, we will add trastuzumab If she is HER2 negative, I will add dostarlimab We discussed the risk, benefits, side effects of chemotherapy using combination of chemotherapy with carboplatin and paclitaxel with or without additional immunotherapy and she is interested to pursue this We will schedule port placement next week As soon as molecular test results are back, we will call her to finalize treatment decision She would qualify for genetic testing as well as clinical trial but the patient appears overwhelmed with information today I gave her prescription for cranial prosthesis

## 2022-10-17 NOTE — Patient Instructions (Signed)
It was very nice to meet you today.  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  We also looked at the pictures of your CT scan which shows cancer spread to lymph nodes and deposits in your abdomen and pelvis.  Given findings of cancer spread or metastatic disease, I am recommending that we start with chemotherapy.  I placed an order for you to get a port put in.  This is a device that helps make giving you chemotherapy and drawing labs easier and safer.  We will also work to get you set up with her medical oncologist, Dr. Bertis Ruddy, who administers the chemotherapy.  I am waiting for some additional testing on your tumor.  This will help decide whether we are going to add immunotherapy or a targeted therapy against a receptor called HER2 to your chemotherapy.  You will receive 3 medications in total.  The goal will ultimately still be to do your surgery in the future if we have response to treatment.

## 2022-10-17 NOTE — Assessment & Plan Note (Signed)
She has mild residual deficit with some balance issue and slight weakness on the left upper extremity but overall is very functional at home She will continue medical management and risk factor modification

## 2022-10-17 NOTE — Telephone Encounter (Signed)
Called [facility] and requested MMR and Her2 testing on accession [phone].

## 2022-10-17 NOTE — Telephone Encounter (Signed)
Spoke with Maria Avila and relayed message from Dr. Pricilla Holm that her CBC and CMP are normal and her CA 125 won't be back until tomorrow. Pt thanked the office for calling and had no questions or concerns at this time.

## 2022-10-17 NOTE — Progress Notes (Signed)
Notified Maria Avila and her husband of the port placement appointment on 10/21/22 at [facility].  Gave her instructions to arrive at 10 am, NPO p midnight, can take morning meds with sips of water, needs a driver to take her home and someone to stay with her for 24 hours after the procedure.  They verbalized understanding and agreement of apt and instructions.

## 2022-10-17 NOTE — Progress Notes (Signed)
Would you mind calling to let her know labs look good? CA-125 won't be back until tomorrow but CBC and CMP are normal. Thank you!

## 2022-10-19 LAB — CA 125: Cancer Antigen (CA) 125: 840 U/mL — ABNORMAL HIGH (ref 0.0–38.1)

## 2022-10-20 ENCOUNTER — Other Ambulatory Visit: Payer: Self-pay | Admitting: Student

## 2022-10-20 ENCOUNTER — Telehealth: Payer: Self-pay | Admitting: Surgery

## 2022-10-20 DIAGNOSIS — C55 Malignant neoplasm of uterus, part unspecified: Secondary | ICD-10-CM

## 2022-10-20 NOTE — Telephone Encounter (Signed)
Called pt, per Melissa, APP, to review CA 125 results. Advised patient that results are elevated but that this is expected given her cancer diagnosis. No changes in treatment plan at this time. Patient verbalized understanding and had no other concerns at this time.

## 2022-10-21 ENCOUNTER — Other Ambulatory Visit: Payer: Self-pay

## 2022-10-21 ENCOUNTER — Ambulatory Visit (HOSPITAL_COMMUNITY)
Admission: RE | Admit: 2022-10-21 | Discharge: 2022-10-21 | Disposition: A | Discharge status: Home / Self Care | Payer: Medicare Other | Source: Ambulatory Visit | Attending: Gynecologic Oncology | Admitting: Gynecologic Oncology

## 2022-10-21 ENCOUNTER — Telehealth: Payer: Self-pay | Admitting: Hematology and Oncology

## 2022-10-21 ENCOUNTER — Encounter: Payer: Self-pay | Admitting: Hematology and Oncology

## 2022-10-21 ENCOUNTER — Other Ambulatory Visit: Payer: Self-pay | Admitting: Hematology and Oncology

## 2022-10-21 ENCOUNTER — Telehealth: Payer: Self-pay | Admitting: Oncology

## 2022-10-21 ENCOUNTER — Encounter (HOSPITAL_COMMUNITY): Payer: Self-pay

## 2022-10-21 DIAGNOSIS — C549 Malignant neoplasm of corpus uteri, unspecified: Secondary | ICD-10-CM | POA: Diagnosis present

## 2022-10-21 DIAGNOSIS — N950 Postmenopausal bleeding: Secondary | ICD-10-CM | POA: Diagnosis not present

## 2022-10-21 DIAGNOSIS — Z452 Encounter for adjustment and management of vascular access device: Secondary | ICD-10-CM | POA: Diagnosis not present

## 2022-10-21 DIAGNOSIS — C55 Malignant neoplasm of uterus, part unspecified: Secondary | ICD-10-CM

## 2022-10-21 HISTORY — PX: IR IMAGING GUIDED PORT INSERTION: IMG5740

## 2022-10-21 LAB — GLUCOSE, CAPILLARY: Glucose-Capillary: 103 mg/dL — ABNORMAL HIGH (ref 70–99)

## 2022-10-21 MED ORDER — LIDOCAINE HCL 1 % IJ SOLN
INTRAMUSCULAR | Status: AC
  Filled 2022-10-21: qty 20

## 2022-10-21 MED ORDER — SODIUM CHLORIDE 0.9 % IV SOLN: INTRAVENOUS | Status: DC

## 2022-10-21 MED ORDER — FENTANYL CITRATE (PF) 100 MCG/2ML IJ SOLN
INTRAMUSCULAR | Status: AC
  Filled 2022-10-21: qty 2

## 2022-10-21 MED ORDER — FENTANYL CITRATE (PF) 100 MCG/2ML IJ SOLN
INTRAMUSCULAR | Status: AC | PRN
  Administered 2022-10-21 (×2): 25 ug via INTRAVENOUS

## 2022-10-21 MED ORDER — LIDOCAINE HCL 1 % IJ SOLN
20.0000 mL | Freq: Once | INTRAMUSCULAR | Status: AC
  Administered 2022-10-21: 15 mL via INTRADERMAL

## 2022-10-21 MED ORDER — HEPARIN SOD (PORK) LOCK FLUSH 100 UNIT/ML IV SOLN
500.0000 [IU] | Freq: Once | INTRAVENOUS | Status: AC
  Administered 2022-10-21: 500 [IU] via INTRAVENOUS

## 2022-10-21 MED ORDER — MIDAZOLAM HCL 2 MG/2ML IJ SOLN
INTRAMUSCULAR | Status: AC | PRN
  Administered 2022-10-21: 1 mg via INTRAVENOUS

## 2022-10-21 MED ORDER — HEPARIN SOD (PORK) LOCK FLUSH 100 UNIT/ML IV SOLN
INTRAVENOUS | Status: AC
  Filled 2022-10-21: qty 5

## 2022-10-21 MED ORDER — LIDOCAINE-EPINEPHRINE 1 %-1:100000 IJ SOLN
INTRAMUSCULAR | Status: AC
  Filled 2022-10-21: qty 1

## 2022-10-21 MED ORDER — MIDAZOLAM HCL 2 MG/2ML IJ SOLN
INTRAMUSCULAR | Status: AC
  Filled 2022-10-21: qty 2

## 2022-10-21 NOTE — Procedures (Signed)
Interventional Radiology Procedure Note  Procedure: Single Lumen Power Port Placement    Access:  Right IJ vein.  Findings: Catheter tip positioned at SVC/RA junction. Port is ready for immediate use.   Complications: None  EBL: < 10 mL  Recommendations:  - Ok to shower in 24 hours - Do not submerge for 7 days - Routine line care   Camron Monday T. Benedicta Sultan, M.D Pager:  [phone]   

## 2022-10-21 NOTE — H&P (Signed)
Chief Complaint: Patient was seen in consultation today for [facility] A Cath placement at the request of Tucker,Katherine R  Referring Physician(s): Tucker,Katherine R Dr Artis Delay  Supervising Physician: Irish Lack  Patient Status: [facility] - Out-pt  History of Present Illness: Maria Avila is a 72 y.o. female   Full Code status per pt New dx Metastatic Uterine cancer Post menopausal bleeding Follows with Dr Bertis Ruddy; Dr Dorian Pod  Scheduled now for [facility] a cath placement To start chemotherapy next week  Past Medical History:  Diagnosis Date   Colitis    Endometrial cancer (HCC)    Hyperlipidemia    Hypertension    Malnutrition (HCC)    Stroke (HCC) 2019   balance still affected    Past Surgical History:  Procedure Laterality Date   BIOPSY  02/26/2018   Procedure: BIOPSY;  Surgeon: Charlott Rakes, MD;  Location: [facility] ENDOSCOPY;  Service: Endoscopy;;   COLONOSCOPY WITH PROPOFOL N/A 02/26/2018   Procedure: COLONOSCOPY WITH PROPOFOL;  Surgeon: Charlott Rakes, MD;  Location: [facility] ENDOSCOPY;  Service: Endoscopy;  Laterality: N/A;   ESOPHAGOGASTRODUODENOSCOPY (EGD) WITH PROPOFOL N/A 02/26/2018   Procedure: ESOPHAGOGASTRODUODENOSCOPY (EGD) WITH PROPOFOL;  Surgeon: Charlott Rakes, MD;  Location: [facility] ENDOSCOPY;  Service: Endoscopy;  Laterality: N/A;   NO PAST SURGERIES     UNCERTAIN OF NAME OF SURGERY    Allergies: Crestor [rosuvastatin calcium] and Zocor [simvastatin]  Medications: Prior to Admission medications   Medication Sig Start Date End Date Taking? Authorizing Provider  acetaminophen (TYLENOL) 325 MG tablet Take 1-2 tablets (325-650 mg total) by mouth every 4 (four) hours as needed for mild pain. 04/09/18  Yes Love, Evlyn Kanner, PA-C  benazepril-hydrochlorthiazide (LOTENSIN HCT) 20-25 MG tablet Take 1 tablet by mouth daily. 11/22/19  Yes [provider]  [employer] VITAMIN C 500 MG tablet TAKE 1 TABLET BY MOUTH TWICE A DAY 05/26/18  Yes Jones Bales, NP   ezetimibe (ZETIA) 10 MG tablet Take 10 mg by mouth daily. 10/06/19  Yes [provider]  hydrochlorothiazide (HYDRODIURIL) 12.5 MG tablet Take 12.5 mg by mouth daily. 10/02/19  Yes [provider]  Multiple Vitamin (MULTIVITAMIN) capsule Take 1 capsule by mouth daily. 07/21/17  Yes [provider]  thiamine (VITAMIN B-1) 100 MG tablet TAKE 1 TABLET BY MOUTH EVERY DAY 05/25/18  Yes Kirsteins, Victorino Sparrow, MD  aspirin EC 81 MG tablet Take 81 mg by mouth daily. OTC    [provider]  pantoprazole (PROTONIX) 40 MG tablet Take 40 mg by mouth 2 (two) times daily. Patient not taking: Reported on 10/16/2022 07/28/18   [provider]     Family History  Problem Relation Age of Onset   Alzheimer's disease Mother    Colon cancer Father    Endometrial cancer Sister        gyn, unsure type   Breast cancer Neg Hx    Ovarian cancer Neg Hx    Prostate cancer Neg Hx    Pancreatic cancer Neg Hx     Social History   Socioeconomic History   Marital status: Married    Spouse name: Not on file   Number of children: Not on file   Years of education: Not on file   Highest education level: Not on file  Occupational History   Not on file  Tobacco Use   Smoking status: Former    Types: Cigarettes    Quit date: 1994    Years since quitting: 30.4   Smokeless tobacco:  Never  Vaping Use   Vaping Use: Never used  Substance and Sexual Activity   Alcohol use: Yes    Comment: occ wine   Drug use: Not Currently   Sexual activity: Yes  Other Topics Concern   Not on file  Social History Narrative   Not on file   Social Determinants of Health   Financial Resource Strain: Not on file  Food Insecurity: No Food Insecurity (10/16/2022)   Hunger Vital Sign    Worried About Running Out of Food in the Last Year: Never true    Ran Out of Food in the Last Year: Never true  Transportation Needs: No Transportation Needs (10/16/2022)   PRAPARE - Scientist, research (physical sciences) (Medical): No    Lack of Transportation (Non-Medical): No  Physical Activity: Not on file  Stress: Not on file  Social Connections: Not on file    Review of Systems: A 12 point ROS discussed and pertinent positives are indicated in the HPI above.  All other systems are negative.  Review of Systems  Constitutional:  Negative for activity change, fatigue and fever.  Respiratory:  Negative for cough and shortness of breath.   Cardiovascular:  Negative for chest pain.  Gastrointestinal:  Negative for abdominal pain.  Neurological:  Negative for weakness.  Psychiatric/Behavioral:  Negative for behavioral problems and confusion.     Vital Signs: There were no vitals taken for this visit.  Advance Care Plan: The advanced care plan/surrogate decision maker was discussed at the time of visit and documented in the medical record.    Physical Exam Vitals reviewed.  HENT:     Mouth/Throat:     Mouth: Mucous membranes are moist.  Cardiovascular:     Rate and Rhythm: Normal rate and regular rhythm.  Pulmonary:     Effort: Pulmonary effort is normal.     Breath sounds: Normal breath sounds.  Abdominal:     Palpations: Abdomen is soft.  Musculoskeletal:        General: Normal range of motion.  Skin:    General: Skin is warm.  Neurological:     Mental Status: She is alert and oriented to person, place, and time.  Psychiatric:        Behavior: Behavior normal.     Imaging: CT CHEST ABDOMEN PELVIS W CONTRAST  Result Date: 10/16/2022 CLINICAL DATA:  Newly diagnosed high-grade serous uterine/endometrial cancer, evaluate for metastatic disease * Tracking Code: BO * EXAM: CT CHEST, ABDOMEN, AND PELVIS WITH CONTRAST TECHNIQUE: Multidetector CT imaging of the chest, abdomen and pelvis was performed following the standard protocol during bolus administration of intravenous contrast. RADIATION DOSE REDUCTION: This exam was performed according to the departmental  dose-optimization program which includes automated exposure control, adjustment of the mA and/or kV according to patient size and/or use of iterative reconstruction technique. CONTRAST:  OMNIPAQUE IOHEXOL 300 MG/ML SOLN additional oral enteric contrast COMPARISON:  CT chest, 03/12/2018, CTA abdomen and pelvis, 02/05/2018 FINDINGS: CT CHEST FINDINGS Cardiovascular: Aortic atherosclerosis. Mild cardiomegaly. Left coronary artery calcifications. No pericardial effusion. Mediastinum/Nodes: Enlarged epicardial lymph nodes or soft tissue nodules, largest on the right measuring up to 1.5 x 0.8 cm (series 2, image 38). No other enlarged mediastinal, hilar, or axillary lymph nodes. Thyroid gland, trachea, and esophagus demonstrate no significant findings. Lungs/Pleura: Dependent bibasilar scarring or atelectasis. Small right pleural effusion. Musculoskeletal: No chest wall abnormality. No acute osseous findings. CT ABDOMEN PELVIS FINDINGS Hepatobiliary: No solid liver abnormality is seen.  Small gallstones. No gallbladder wall thickening, or biliary dilatation. Pancreas: Unremarkable. No pancreatic ductal dilatation or surrounding inflammatory changes. Spleen: Normal in size without significant abnormality. Adrenals/Urinary Tract: Adrenal glands are unremarkable. Kidneys are normal, without renal calculi, solid lesion, or hydronephrosis. Bladder is unremarkable. Stomach/Bowel: Stomach is within normal limits. Appendix not clearly visualized. Pancolonic diverticulosis, severe in the descending and sigmoid colon. Soft tissue matting about the distal sigmoid colon and rectum in the low pelvis (series 2, image 95). Vascular/Lymphatic: Aortic atherosclerosis. Enlarged right iliac and pelvic sidewall lymph nodes, largest external iliac node measuring 2.3 x 1.4 cm (series 2, image 94). Reproductive: Ill-defined fullness of the uterus and endometrial cavity without discretely visualized mass (series 6, image 113). Normal  postmenopausal ovaries. Other: No abdominal wall hernia or abnormality. Scattered peritoneal and omental soft tissue nodularity, particularly notable in the left upper quadrant (series 2, image 73). Musculoskeletal: No acute osseous findings. IMPRESSION: 1. Ill-defined fullness of the uterus and endometrial cavity without discretely visualized mass, in keeping with patient's known endometrial malignancy. 2. Enlarged right iliac and pelvic sidewall lymph nodes, as well as enlarged epicardial lymph nodes or soft tissue nodules. 3. Scattered peritoneal and omental soft tissue nodularity, particularly notable in the left upper quadrant. 4. Soft tissue matting about the distal sigmoid colon and rectum in the low pelvis. 5. Constellation of findings is consistent with nodal, peritoneal, and omental metastatic disease. 6. Small right pleural effusion, nonspecific although modestly suspicious for a malignant effusion, however without directly visualized pleural mass or nodularity. 7. Pancolonic diverticulosis, severe in the descending and sigmoid colon. 8. Cholelithiasis. 9. Coronary artery disease. These results will be called to the ordering clinician or representative by the Radiologist Assistant, and communication documented in the PACS or [employer]. Aortic Atherosclerosis (ICD10-I70.0). Electronically Signed   By: Jearld Lesch M.D.   On: 10/16/2022 07:06    Labs:  CBC: Recent Labs    10/17/22 1034  WBC 4.1  HGB 13.0  HCT 38.1  PLT 217    COAGS: No results for input(s): "INR", "APTT" in the last 8760 hours.  BMP: Recent Labs    10/17/22 1034  NA 139  K 3.7  CL 105  CO2 28  GLUCOSE 100*  BUN 13  CALCIUM 9.5  CREATININE 0.79  GFRNONAA >60    LIVER FUNCTION TESTS: Recent Labs    10/17/22 1034  BILITOT 0.5  AST 16  ALT 13  ALKPHOS 63  PROT 6.8  ALBUMIN 4.0    TUMOR MARKERS: No results for input(s): "AFPTM", "CEA", "CA199", "CHROMGRNA" in the last 8760 hours.  Assessment  and Plan:  Scheduled for PAC placement Risks and benefits of image guided port-a-catheter placement was discussed with the patient including, but not limited to bleeding, infection, pneumothorax, or fibrin sheath development and need for additional procedures.  All of the patient's questions were answered, patient is agreeable to proceed. Consent signed and in chart.  Thank you for this interesting consult.  I greatly enjoyed meeting Chieko E Avila and look forward to participating in their care.  A copy of this report was sent to the requesting provider on this date.  Electronically Signed: Robet Leu, PA-C 10/21/2022, 10:39 AM   I spent a total of  30 Minutes   in face to face in clinical consultation, greater than 50% of which was counseling/coordinating care for [facility] placement

## 2022-10-21 NOTE — Telephone Encounter (Signed)
Called Maria Avila and advised her of the Echocardiogram appointment on 10/23/22 at 10:00 at [facility].  Advised her to go to entrance C.  Discussed this is needed before she will start chemotherapy next week.  Also let her know a scheduler will call her with appointments for next week.  She verbalized understanding and agreement of appointments and plan.

## 2022-10-21 NOTE — Progress Notes (Signed)
START ON PATHWAY REGIMEN - Uterine     Cycle 1: A cycle is 21 days:     Trastuzumab-xxxx      Paclitaxel      Carboplatin    Cycles 2 through 6: A cycle is every 21 days:     Trastuzumab-xxxx      Paclitaxel      Carboplatin    Cycles 7 and beyond: A cycle is every 21 days:     Trastuzumab-xxxx   **Always confirm dose/schedule in your pharmacy ordering system**  Patient Characteristics: Serous Carcinoma, Newly Diagnosed (Clinical Staging), Nonsurgical Candidate, Stage III or IV, HER2 Positive Histology: Serous Carcinoma Therapeutic Status: Newly Diagnosed (Clinical Staging) AJCC M Category: cM1 Surgical Candidacy: Nonsurgical Candidate AJCC 8 Stage Grouping: IVB AJCC T Category: cT1 AJCC N Category: cN1 HER2 Status: Positive  Intent of Therapy: Non-Curative / Palliative Intent, Discussed with Patient

## 2022-10-21 NOTE — Telephone Encounter (Signed)
Spoke with patient husband about upcoming appointments

## 2022-10-23 ENCOUNTER — Ambulatory Visit (HOSPITAL_COMMUNITY)
Admission: RE | Admit: 2022-10-23 | Discharge: 2022-10-23 | Disposition: A | Discharge status: Home / Self Care | Payer: Medicare Other | Source: Ambulatory Visit | Attending: Hematology and Oncology | Admitting: Hematology and Oncology

## 2022-10-23 ENCOUNTER — Inpatient Hospital Stay (HOSPITAL_BASED_OUTPATIENT_CLINIC_OR_DEPARTMENT_OTHER): Payer: Medicare Other | Admitting: Hematology and Oncology

## 2022-10-23 ENCOUNTER — Inpatient Hospital Stay: Payer: Medicare Other

## 2022-10-23 ENCOUNTER — Other Ambulatory Visit: Payer: Self-pay

## 2022-10-23 ENCOUNTER — Encounter: Payer: Self-pay | Admitting: Hematology and Oncology

## 2022-10-23 VITALS — BP 135/71 | HR 65 | Temp 98.1°F | Resp 18 | Ht 62.0 in | Wt 196.0 lb

## 2022-10-23 DIAGNOSIS — Z7982 Long term (current) use of aspirin: Secondary | ICD-10-CM | POA: Diagnosis not present

## 2022-10-23 DIAGNOSIS — R971 Elevated cancer antigen 125 [CA 125]: Secondary | ICD-10-CM | POA: Diagnosis not present

## 2022-10-23 DIAGNOSIS — Z800 Family history of malignant neoplasm of digestive organs: Secondary | ICD-10-CM | POA: Diagnosis not present

## 2022-10-23 DIAGNOSIS — Z5112 Encounter for antineoplastic immunotherapy: Secondary | ICD-10-CM | POA: Diagnosis not present

## 2022-10-23 DIAGNOSIS — C55 Malignant neoplasm of uterus, part unspecified: Secondary | ICD-10-CM

## 2022-10-23 DIAGNOSIS — Z0189 Encounter for other specified special examinations: Secondary | ICD-10-CM | POA: Diagnosis not present

## 2022-10-23 DIAGNOSIS — E785 Hyperlipidemia, unspecified: Secondary | ICD-10-CM | POA: Insufficient documentation

## 2022-10-23 DIAGNOSIS — I10 Essential (primary) hypertension: Secondary | ICD-10-CM | POA: Diagnosis not present

## 2022-10-23 DIAGNOSIS — Z87891 Personal history of nicotine dependence: Secondary | ICD-10-CM | POA: Diagnosis not present

## 2022-10-23 DIAGNOSIS — Z79899 Other long term (current) drug therapy: Secondary | ICD-10-CM | POA: Diagnosis not present

## 2022-10-23 DIAGNOSIS — K529 Noninfective gastroenteritis and colitis, unspecified: Secondary | ICD-10-CM | POA: Diagnosis not present

## 2022-10-23 DIAGNOSIS — Z8673 Personal history of transient ischemic attack (TIA), and cerebral infarction without residual deficits: Secondary | ICD-10-CM

## 2022-10-23 DIAGNOSIS — R599 Enlarged lymph nodes, unspecified: Secondary | ICD-10-CM | POA: Diagnosis not present

## 2022-10-23 DIAGNOSIS — Z5111 Encounter for antineoplastic chemotherapy: Secondary | ICD-10-CM | POA: Diagnosis not present

## 2022-10-23 LAB — CMP (CANCER CENTER ONLY)
ALT: 13 U/L (ref 0–44)
AST: 18 U/L (ref 15–41)
Albumin: 3.9 g/dL (ref 3.5–5.0)
Alkaline Phosphatase: 60 U/L (ref 38–126)
Anion gap: 7 (ref 5–15)
BUN: 15 mg/dL (ref 8–23)
CO2: 28 mmol/L (ref 22–32)
Calcium: 9.9 mg/dL (ref 8.9–10.3)
Chloride: 104 mmol/L (ref 98–111)
Creatinine: 0.99 mg/dL (ref 0.44–1.00)
GFR, Estimated: >60 mL/min (ref >60)
Glucose, Bld: 94 mg/dL (ref 70–99)
Potassium: 3.7 mmol/L (ref 3.5–5.1)
Sodium: 139 mmol/L (ref 135–145)
Total Bilirubin: 0.4 mg/dL (ref 0.3–1.2)
Total Protein: 6.7 g/dL (ref 6.5–8.1)

## 2022-10-23 LAB — ECHOCARDIOGRAM COMPLETE
AR max vel: 1.88 cm2
AV Area VTI: 2.21 cm2
AV Area mean vel: 1.88 cm2
AV Mean grad: 4 mmHg
AV Peak grad: 7.1 mmHg
Ao pk vel: 1.34 m/s
Area-P 1/2: 2.37 cm2
Calc EF: 68.1 %
S' Lateral: 1.9 cm
Single Plane A2C EF: 63.8 %
Single Plane A4C EF: 71.3 %

## 2022-10-23 LAB — CBC WITH DIFFERENTIAL (CANCER CENTER ONLY)
Abs Immature Granulocytes: 0.01 10*3/uL (ref 0.00–0.07)
Basophils Absolute: 0 10*3/uL (ref 0.0–0.1)
Basophils Relative: 0 %
Eosinophils Absolute: 0.2 10*3/uL (ref 0.0–0.5)
Eosinophils Relative: 4 %
HCT: 38.3 % (ref 36.0–46.0)
Hemoglobin: 13 g/dL (ref 12.0–15.0)
Immature Granulocytes: 0 %
Lymphocytes Relative: 42 %
Lymphs Abs: 2.3 10*3/uL (ref 0.7–4.0)
MCH: 33.2 pg (ref 26.0–34.0)
MCHC: 33.9 g/dL (ref 30.0–36.0)
MCV: 97.7 fL (ref 80.0–100.0)
Monocytes Absolute: 0.3 10*3/uL (ref 0.1–1.0)
Monocytes Relative: 6 %
Neutro Abs: 2.6 10*3/uL (ref 1.7–7.7)
Neutrophils Relative %: 48 %
Platelet Count: 215 10*3/uL (ref 150–400)
RBC: 3.92 MIL/uL (ref 3.87–5.11)
RDW: 12.5 % (ref 11.5–15.5)
WBC Count: 5.4 10*3/uL (ref 4.0–10.5)
nRBC: 0 % (ref 0.0–0.2)

## 2022-10-23 MED ORDER — DEXAMETHASONE 4 MG PO TABS: ORAL_TABLET | ORAL | 6 refills | Status: DC

## 2022-10-23 MED ORDER — ONDANSETRON HCL 8 MG PO TABS: 8.0000 mg | ORAL_TABLET | Freq: Three times a day (TID) | ORAL | 3 refills | Status: DC | PRN

## 2022-10-23 MED ORDER — LIDOCAINE-PRILOCAINE 2.5-2.5 % EX CREA
TOPICAL_CREAM | CUTANEOUS | 3 refills | Status: DC
Start: 2022-10-23 — End: 2023-06-19

## 2022-10-23 MED ORDER — PROCHLORPERAZINE MALEATE 10 MG PO TABS
10.0000 mg | ORAL_TABLET | Freq: Four times a day (QID) | ORAL | 1 refills | Status: DC | PRN
Start: 2022-10-23 — End: 2023-12-14

## 2022-10-23 MED ORDER — DEXAMETHASONE 4 MG PO TABS
ORAL_TABLET | ORAL | 1 refills | Status: DC
Start: 2022-10-23 — End: 2022-10-23

## 2022-10-23 NOTE — Assessment & Plan Note (Addendum)
I have reviewed her CT imaging The patient has stage IV disease However, the disease burden is not significant and she can still be cured with neoadjuvant chemotherapy followed by surgery Molecular testing showed that the patient tested HER2 positive; I will add trastuzumab We discussed the risk, benefits, side effects of chemotherapy using combination of chemotherapy with carboplatin, paclitaxel and trastuzumab and she is interested to pursue this Port is placed We discussed clinical trial but the patient is not interested I recommend genetic counseling I reviewed her echocardiogram report which is within normal range She will get echocardiogram monitoring every 3 months I plan to repeat imaging study after 3-4 cycles of therapy

## 2022-10-23 NOTE — Progress Notes (Signed)
[facility] OFFICE PROGRESS NOTE  Patient Care Team: Merri Brunette, MD as PCP - General (Internal Medicine)  ASSESSMENT & PLAN:  Uterine cancer [facility]) I have reviewed her CT imaging The patient has stage IV disease However, the disease burden is not significant and she can still be cured with neoadjuvant chemotherapy followed by surgery Molecular testing showed that the patient tested HER2 positive; I will add trastuzumab We discussed the risk, benefits, side effects of chemotherapy using combination of chemotherapy with carboplatin, paclitaxel and trastuzumab and she is interested to pursue this Port is placed We discussed clinical trial but the patient is not interested I recommend genetic counseling I reviewed her echocardiogram report which is within normal range She will get echocardiogram monitoring every 3 months I plan to repeat imaging study after 3-4 cycles of therapy  History of stroke She has mild residual deficit with some balance issue and slight weakness on the left upper extremity but overall is very functional at home She will continue medical management and risk factor modification  Orders Placed This Encounter  Procedures   Ambulatory referral to Genetics    Referral Priority:   Routine    Referral Type:   Consultation    Referral Reason:   Specialty Services Required    Number of Visits Requested:   1    All questions were answered. The patient knows to call the clinic with any problems, questions or concerns. The total time spent in the appointment was 30 minutes encounter with patients including review of chart and various tests results, discussions about plan of care and coordination of care plan   Artis Delay, MD 10/23/2022 3:33 PM  INTERVAL HISTORY: Please see below for problem oriented charting. she returns for treatment follow-up Gave her a copy of her recent pathology report that indicated that she tested positive for Her2/neu We  discussed side effects of treatment to be expected and future follow-up  REVIEW OF SYSTEMS:   Constitutional: Denies fevers, chills or abnormal weight loss Eyes: Denies blurriness of vision Ears, nose, mouth, throat, and face: Denies mucositis or sore throat Respiratory: Denies cough, dyspnea or wheezes Cardiovascular: Denies palpitation, chest discomfort or lower extremity swelling Gastrointestinal:  Denies nausea, heartburn or change in bowel habits Skin: Denies abnormal skin rashes Lymphatics: Denies new lymphadenopathy or easy bruising Neurological:Denies numbness, tingling or new weaknesses Behavioral/Psych: Mood is stable, no new changes  All other systems were reviewed with the patient and are negative.  I have reviewed the past medical history, past surgical history, social history and family history with the patient and they are unchanged from previous note.  ALLERGIES:  is allergic to crestor [rosuvastatin calcium] and zocor [simvastatin].  MEDICATIONS:  Current Outpatient Medications  Medication Sig Dispense Refill   dexamethasone (DECADRON) 4 MG tablet Take 2 tabs at the night before and 2 tab the morning of chemotherapy, every 3 weeks, by mouth x 6 cycles 36 tablet 6   ondansetron (ZOFRAN) 8 MG tablet Take 1 tablet (8 mg total) by mouth every 8 (eight) hours as needed for nausea. 30 tablet 3   acetaminophen (TYLENOL) 325 MG tablet Take 1-2 tablets (325-650 mg total) by mouth every 4 (four) hours as needed for mild pain.     aspirin EC 81 MG tablet Take 81 mg by mouth daily. OTC     benazepril-hydrochlorthiazide (LOTENSIN HCT) 20-25 MG tablet Take 1 tablet by mouth daily.     [employer] VITAMIN C 500 MG tablet TAKE 1  TABLET BY MOUTH TWICE A DAY 100 tablet 0   ezetimibe (ZETIA) 10 MG tablet Take 10 mg by mouth daily.     hydrochlorothiazide (HYDRODIURIL) 12.5 MG tablet Take 12.5 mg by mouth daily.     lidocaine-prilocaine (EMLA) cream Apply to affected area once 30 g 3   Multiple  Vitamin (MULTIVITAMIN) capsule Take 1 capsule by mouth daily.     pantoprazole (PROTONIX) 40 MG tablet Take 40 mg by mouth 2 (two) times daily. (Patient not taking: Reported on 10/16/2022)     prochlorperazine (COMPAZINE) 10 MG tablet Take 1 tablet (10 mg total) by mouth every 6 (six) hours as needed for nausea or vomiting. 30 tablet 1   thiamine (VITAMIN B-1) 100 MG tablet TAKE 1 TABLET BY MOUTH EVERY DAY 30 tablet 0   No current facility-administered medications for this visit.    SUMMARY OF ONCOLOGIC HISTORY: Oncology History Overview Note  MMR normal, Her2 positive   Uterine cancer (HCC)  08/12/2022 Initial Diagnosis   She presented with PMB   09/24/2022 Pathology Results   Diagnosis 1. Endometrium, curettage - HIGH GRADE SEROUS CARCINOMA (SEE NOTE) 2. Endometrium, resection - HIGH GRADE SEROUS CARCINOMA (SEE NOTE) Diagnosis Note 1. - immunohistochemical stain reveal tumor cells are positive for p16. P53 shows strong expression consistent with mutant type. This case was reviewed with Dr. Reynolds Bowl who agrees with the diagnosis   10/14/2022 Imaging   1. Ill-defined fullness of the uterus and endometrial cavity without discretely visualized mass, in keeping with patient's known endometrial malignancy. 2. Enlarged right iliac and pelvic sidewall lymph nodes, as well as enlarged epicardial lymph nodes or soft tissue nodules. 3. Scattered peritoneal and omental soft tissue nodularity, particularly notable in the left upper quadrant. 4. Soft tissue matting about the distal sigmoid colon and rectum in the low pelvis. 5. Constellation of findings is consistent with nodal, peritoneal, and omental metastatic disease. 6. Small right pleural effusion, nonspecific although modestly suspicious for a malignant effusion, however without directly visualized pleural mass or nodularity. 7. Pancolonic diverticulosis, severe in the descending and sigmoid colon. 8. Cholelithiasis. 9. Coronary artery disease.    10/17/2022 Initial Diagnosis   Uterine cancer (HCC)   10/17/2022 Cancer Staging   Staging form: Corpus Uteri - Carcinoma and Carcinosarcoma, AJCC 8th Edition - Clinical stage from 10/17/2022: FIGO Stage IVB (cT1b, cN1, cM1) - Signed by Artis Delay, MD on 10/17/2022 Stage prefix: Initial diagnosis   10/23/2022 Echocardiogram   1. Left ventricular ejection fraction, by estimation, is 65 to 70%. Left ventricular ejection fraction by 2D MOD biplane is 68.1 %. The left ventricle has normal function. The left ventricle has no regional wall motion abnormalities. There is mild left ventricular hypertrophy. Left ventricular diastolic parameters are consistent with Grade I diastolic dysfunction (impaired relaxation).  2. Right ventricular systolic function is normal. The right ventricular size is normal. There is normal pulmonary artery systolic pressure. The estimated right ventricular systolic pressure is 27.6 mmHg.  3. The mitral valve is abnormal. Trivial mitral valve regurgitation.  4. The aortic valve is tricuspid. Aortic valve regurgitation is not visualized. Aortic valve sclerosis is present, with no evidence of aortic valve stenosis.  5. The inferior vena cava is normal in size with greater than 50% respiratory variability, suggesting right atrial pressure of 3 mmHg.   10/27/2022 -  Chemotherapy   Patient is on Treatment Plan : UTERINE SEROUS CARCINOMA Carboplatin + Paclitaxel + Trastuzumab q21d x 6 Cycles / Trastuzumab q21d  PHYSICAL EXAMINATION: ECOG PERFORMANCE STATUS: 0 - Asymptomatic  Vitals:   10/23/22 1406  BP: 135/71  Pulse: 65  Resp: 18  Temp: 98.1 F (36.7 C)  SpO2: 97%   Filed Weights   10/23/22 1406  Weight: 196 lb (88.9 kg)    GENERAL:alert, no distress and comfortable NEURO: alert & oriented x 3 with fluent speech, no focal motor/sensory deficits  LABORATORY DATA:  I have reviewed the data as listed    Component Value Date/Time   NA 139 10/23/2022 1353   NA  144 05/13/2018 1500   K 3.7 10/23/2022 1353   CL 104 10/23/2022 1353   CO2 28 10/23/2022 1353   GLUCOSE 94 10/23/2022 1353   BUN 15 10/23/2022 1353   BUN 13 05/13/2018 1500   CREATININE 0.99 10/23/2022 1353   CALCIUM 9.9 10/23/2022 1353   PROT 6.7 10/23/2022 1353   ALBUMIN 3.9 10/23/2022 1353   AST 18 10/23/2022 1353   ALT 13 10/23/2022 1353   ALKPHOS 60 10/23/2022 1353   BILITOT 0.4 10/23/2022 1353   GFRNONAA >60 10/23/2022 1353   GFRAA 110 05/13/2018 1500   GFRAA >60 07/21/2017 1131    No results found for: "SPEP", "UPEP"  Lab Results  Component Value Date   WBC 5.4 10/23/2022   NEUTROABS 2.6 10/23/2022   HGB 13.0 10/23/2022   HCT 38.3 10/23/2022   MCV 97.7 10/23/2022   PLT 215 10/23/2022      Chemistry      Component Value Date/Time   NA 139 10/23/2022 1353   NA 144 05/13/2018 1500   K 3.7 10/23/2022 1353   CL 104 10/23/2022 1353   CO2 28 10/23/2022 1353   BUN 15 10/23/2022 1353   BUN 13 05/13/2018 1500   CREATININE 0.99 10/23/2022 1353      Component Value Date/Time   CALCIUM 9.9 10/23/2022 1353   ALKPHOS 60 10/23/2022 1353   AST 18 10/23/2022 1353   ALT 13 10/23/2022 1353   BILITOT 0.4 10/23/2022 1353       RADIOGRAPHIC STUDIES: I have personally reviewed the radiological images as listed and agreed with the findings in the report. ECHOCARDIOGRAM COMPLETE  Result Date: 10/23/2022    ECHOCARDIOGRAM REPORT   Patient Name:   Maria Avila Date of Exam: 10/23/2022 Medical Rec #:  [id number]     Height:       62.0 in Accession #:    [id number]    Weight:       190.0 lb Date of Birth:  01-15-1951     BSA:          1.870 m Patient Age:    71 years      BP:           129/61 mmHg Patient Gender: F             HR:           78 bpm. Exam Location:  Outpatient Procedure: 2D Echo, Color Doppler and Cardiac Doppler Indications:    Z09 Chemo  History:        Patient has prior history of Echocardiogram examinations.                 Stroke; Risk Factors:Hypertension and  Dyslipidemia.  Sonographer:    L. Thornton-Maynard Referring Phys: [id number] Jinelle Butchko [facility]  Sonographer Comments: Suboptimal apical window. Therefore strain values could not be accurately assessed. and Image acquisition challenging due to patient body habitus. Global longitudinal strain was attempted.  IMPRESSIONS  1. Left ventricular ejection fraction, by estimation, is 65 to 70%. Left ventricular ejection fraction by 2D MOD biplane is 68.1 %. The left ventricle has normal function. The left ventricle has no regional wall motion abnormalities. There is mild left ventricular hypertrophy. Left ventricular diastolic parameters are consistent with Grade I diastolic dysfunction (impaired relaxation).  2. Right ventricular systolic function is normal. The right ventricular size is normal. There is normal pulmonary artery systolic pressure. The estimated right ventricular systolic pressure is 27.6 mmHg.  3. The mitral valve is abnormal. Trivial mitral valve regurgitation.  4. The aortic valve is tricuspid. Aortic valve regurgitation is not visualized. Aortic valve sclerosis is present, with no evidence of aortic valve stenosis.  5. The inferior vena cava is normal in size with greater than 50% respiratory variability, suggesting right atrial pressure of 3 mmHg. Comparison(s): No significant change from prior study. 03/10/2018: LVEF 65-70%, grade 1 DD. FINDINGS  Left Ventricle: Left ventricular ejection fraction, by estimation, is 65 to 70%. Left ventricular ejection fraction by 2D MOD biplane is 68.1 %. The left ventricle has normal function. The left ventricle has no regional wall motion abnormalities. Global  longitudinal strain performed but not reported based on interpreter judgement due to suboptimal tracking. The left ventricular internal cavity size was normal in size. There is mild left ventricular hypertrophy. Left ventricular diastolic parameters are  consistent with Grade I diastolic dysfunction (impaired relaxation).  Indeterminate filling pressures. Right Ventricle: The right ventricular size is normal. No increase in right ventricular wall thickness. Right ventricular systolic function is normal. There is normal pulmonary artery systolic pressure. The tricuspid regurgitant velocity is 2.48 m/s, and  with an assumed right atrial pressure of 3 mmHg, the estimated right ventricular systolic pressure is 27.6 mmHg. Left Atrium: Left atrial size was normal in size. Right Atrium: Right atrial size was normal in size. Pericardium: There is no evidence of pericardial effusion. Mitral Valve: The mitral valve is abnormal. Mild mitral annular calcification. Trivial mitral valve regurgitation. Tricuspid Valve: The tricuspid valve is grossly normal. Tricuspid valve regurgitation is trivial. Aortic Valve: The aortic valve is tricuspid. Aortic valve regurgitation is not visualized. Aortic valve sclerosis is present, with no evidence of aortic valve stenosis. Aortic valve mean gradient measures 4.0 mmHg. Aortic valve peak gradient measures 7.1  mmHg. Aortic valve area, by VTI measures 2.21 cm. Pulmonic Valve: The pulmonic valve was grossly normal. Pulmonic valve regurgitation is trivial. Aorta: The aortic root and ascending aorta are structurally normal, with no evidence of dilitation. Venous: The inferior vena cava is normal in size with greater than 50% respiratory variability, suggesting right atrial pressure of 3 mmHg. IAS/Shunts: No atrial level shunt detected by color flow Doppler.  LEFT VENTRICLE PLAX 2D                        Biplane EF (MOD) LVIDd:         2.70 cm         LV Biplane EF:   Left LVIDs:         1.90 cm                          ventricular LV PW:         1.10 cm                          ejection LV IVS:  1.10 cm                          fraction by LVOT diam:     1.80 cm                          2D MOD LV SV:         54                               biplane is LV SV Index:   29                               68.1 %.  LVOT Area:     2.54 cm                                Diastology                                LV e' medial:    7.45 cm/s LV Volumes (MOD)               LV E/e' medial:  9.1 LV vol d, MOD    39.8 ml       LV e' lateral:   10.80 cm/s A2C:                           LV E/e' lateral: 6.3 LV vol d, MOD    56.8 ml A4C: LV vol s, MOD    14.4 ml A2C: LV vol s, MOD    16.3 ml A4C: LV SV MOD A2C:   25.4 ml LV SV MOD A4C:   56.8 ml LV SV MOD BP:    32.4 ml RIGHT VENTRICLE             IVC RV Basal diam:  3.70 cm     IVC diam: 0.80 cm RV S prime:     11.00 cm/s TAPSE (M-mode): 1.9 cm LEFT ATRIUM             Index        RIGHT ATRIUM           Index LA diam:        3.50 cm 1.87 cm/m   RA Area:     16.90 cm LA Vol (A2C):   35.1 ml 18.77 ml/m  RA Volume:   40.90 ml  21.87 ml/m LA Vol (A4C):   34.9 ml 18.66 ml/m LA Biplane Vol: 36.8 ml 19.67 ml/m  AORTIC VALVE                    PULMONIC VALVE AV Area (Vmax):    1.88 cm     PV Vmax:          0.88 m/s AV Area (Vmean):   1.88 cm     PV Peak grad:     3.1 mmHg AV Area (VTI):     2.21 cm     PR End Diast Vel: 7.29 msec AV Vmax:           133.50 cm/s AV Vmean:  92.400 cm/s AV VTI:            0.246 m AV Peak Grad:      7.1 mmHg AV Mean Grad:      4.0 mmHg LVOT Vmax:         98.50 cm/s LVOT Vmean:        68.100 cm/s LVOT VTI:          0.213 m LVOT/AV VTI ratio: 0.87  AORTA Ao Root diam: 3.20 cm Ao Asc diam:  3.30 cm MITRAL VALVE               TRICUSPID VALVE MV Area (PHT): 2.37 cm    TR Peak grad:   24.6 mmHg MV Decel Time: 320 msec    TR Vmax:        248.00 cm/s MV E velocity: 67.60 cm/s MV A velocity: 68.70 cm/s  SHUNTS MV E/A ratio:  0.98        Systemic VTI:  0.21 m                            Systemic Diam: 1.80 cm Zoila Shutter MD Electronically signed by Zoila Shutter MD Signature Date/Time: 10/23/2022/12:10:05 PM    Final    IR IMAGING GUIDED PORT INSERTION  Result Date: 10/21/2022 CLINICAL DATA:  Metastatic uterine carcinoma and need for porta cath to begin  chemotherapy. EXAM: IMPLANTED PORT A CATH PLACEMENT WITH ULTRASOUND AND FLUOROSCOPIC GUIDANCE ANESTHESIA/SEDATION: Moderate (conscious) sedation was employed during this procedure. A total of Versed 1.0 mg and Fentanyl 50 mcg was administered intravenously. Moderate Sedation Time: 33 minutes. The patient's level of consciousness and vital signs were monitored continuously by radiology nursing throughout the procedure under my direct supervision. FLUOROSCOPY: 24 seconds.  3.0 mGy PROCEDURE: The procedure, risks, benefits, and alternatives were explained to the patient. Questions regarding the procedure were encouraged and answered. The patient understands and consents to the procedure. A time-out was performed prior to initiating the procedure. Ultrasound was utilized to confirm patency of the right internal jugular vein. A permanent ultrasound image was saved and record. The right neck and chest were prepped with chlorhexidine in a sterile fashion, and a sterile drape was applied covering the operative field. Maximum barrier sterile technique with sterile gowns and gloves were used for the procedure. Local anesthesia was provided with 1% lidocaine. After creating a small venotomy incision, a 21 gauge needle was advanced into the right internal jugular vein under direct, real-time ultrasound guidance. Ultrasound image documentation was performed. After securing guidewire access, an 8 Fr dilator was placed. A J-wire was kinked to measure appropriate catheter length. A subcutaneous port pocket was then created along the upper chest wall utilizing sharp and blunt dissection. Portable cautery was utilized. The pocket was irrigated with sterile saline. A single lumen power injectable port was chosen for placement. The 8 Fr catheter was tunneled from the port pocket site to the venotomy incision. The port was placed in the pocket. External catheter was trimmed to appropriate length based on guidewire measurement. At the  venotomy, an 8 Fr peel-away sheath was placed over a guidewire. The catheter was then placed through the sheath and the sheath removed. Final catheter positioning was confirmed and documented with a fluoroscopic spot image. The port was accessed with a needle and aspirated and flushed with heparinized saline. The access needle was removed. The venotomy and port pocket incisions were closed with subcutaneous 3-0 Monocryl and  subcuticular 4-0 Vicryl. Dermabond was applied to both incisions. COMPLICATIONS: COMPLICATIONS None FINDINGS: After catheter placement, the tip lies at the cavo-atrial junction. The catheter aspirates normally and is ready for immediate use. IMPRESSION: Placement of single lumen port a cath via right internal jugular vein. The catheter tip lies at the cavo-atrial junction. A power injectable port a cath was placed and is ready for immediate use. Electronically Signed   By: Irish Lack M.D.   On: 10/21/2022 13:44   CT CHEST ABDOMEN PELVIS W CONTRAST  Result Date: 10/16/2022 CLINICAL DATA:  Newly diagnosed high-grade serous uterine/endometrial cancer, evaluate for metastatic disease * Tracking Code: BO * EXAM: CT CHEST, ABDOMEN, AND PELVIS WITH CONTRAST TECHNIQUE: Multidetector CT imaging of the chest, abdomen and pelvis was performed following the standard protocol during bolus administration of intravenous contrast. RADIATION DOSE REDUCTION: This exam was performed according to the departmental dose-optimization program which includes automated exposure control, adjustment of the mA and/or kV according to patient size and/or use of iterative reconstruction technique. CONTRAST:  OMNIPAQUE IOHEXOL 300 MG/ML SOLN additional oral enteric contrast COMPARISON:  CT chest, 03/12/2018, CTA abdomen and pelvis, 02/05/2018 FINDINGS: CT CHEST FINDINGS Cardiovascular: Aortic atherosclerosis. Mild cardiomegaly. Left coronary artery calcifications. No pericardial effusion. Mediastinum/Nodes:  Enlarged epicardial lymph nodes or soft tissue nodules, largest on the right measuring up to 1.5 x 0.8 cm (series 2, image 38). No other enlarged mediastinal, hilar, or axillary lymph nodes. Thyroid gland, trachea, and esophagus demonstrate no significant findings. Lungs/Pleura: Dependent bibasilar scarring or atelectasis. Small right pleural effusion. Musculoskeletal: No chest wall abnormality. No acute osseous findings. CT ABDOMEN PELVIS FINDINGS Hepatobiliary: No solid liver abnormality is seen. Small gallstones. No gallbladder wall thickening, or biliary dilatation. Pancreas: Unremarkable. No pancreatic ductal dilatation or surrounding inflammatory changes. Spleen: Normal in size without significant abnormality. Adrenals/Urinary Tract: Adrenal glands are unremarkable. Kidneys are normal, without renal calculi, solid lesion, or hydronephrosis. Bladder is unremarkable. Stomach/Bowel: Stomach is within normal limits. Appendix not clearly visualized. Pancolonic diverticulosis, severe in the descending and sigmoid colon. Soft tissue matting about the distal sigmoid colon and rectum in the low pelvis (series 2, image 95). Vascular/Lymphatic: Aortic atherosclerosis. Enlarged right iliac and pelvic sidewall lymph nodes, largest external iliac node measuring 2.3 x 1.4 cm (series 2, image 94). Reproductive: Ill-defined fullness of the uterus and endometrial cavity without discretely visualized mass (series 6, image 113). Normal postmenopausal ovaries. Other: No abdominal wall hernia or abnormality. Scattered peritoneal and omental soft tissue nodularity, particularly notable in the left upper quadrant (series 2, image 73). Musculoskeletal: No acute osseous findings. IMPRESSION: 1. Ill-defined fullness of the uterus and endometrial cavity without discretely visualized mass, in keeping with patient's known endometrial malignancy. 2. Enlarged right iliac and pelvic sidewall lymph nodes, as well as enlarged epicardial lymph  nodes or soft tissue nodules. 3. Scattered peritoneal and omental soft tissue nodularity, particularly notable in the left upper quadrant. 4. Soft tissue matting about the distal sigmoid colon and rectum in the low pelvis. 5. Constellation of findings is consistent with nodal, peritoneal, and omental metastatic disease. 6. Small right pleural effusion, nonspecific although modestly suspicious for a malignant effusion, however without directly visualized pleural mass or nodularity. 7. Pancolonic diverticulosis, severe in the descending and sigmoid colon. 8. Cholelithiasis. 9. Coronary artery disease. These results will be called to the ordering clinician or representative by the Radiologist Assistant, and communication documented in the PACS or [employer]. Aortic Atherosclerosis (ICD10-I70.0). Electronically Signed   By: Jearld Lesch  M.D.   On: 10/16/2022 07:06

## 2022-10-23 NOTE — Assessment & Plan Note (Signed)
She has mild residual deficit with some balance issue and slight weakness on the left upper extremity but overall is very functional at home She will continue medical management and risk factor modification

## 2022-10-24 ENCOUNTER — Other Ambulatory Visit: Payer: Self-pay

## 2022-10-24 ENCOUNTER — Encounter: Payer: Self-pay | Admitting: Hematology and Oncology

## 2022-10-24 NOTE — Progress Notes (Signed)
Patient's spouse called after receiving my card per my request.  Introduced myself as Dance movement psychotherapist and to offer available resources. Discussed one-time $1000 Marketing executive to assist with personal expenses while going through treatment. Advised what is needed to apply and to bring at net appointment 6/25 and provide at check-in to be sent to me. They will then receive grant paperwork and expense sheet regarding  grant expenses. Advised to contact me at earliest convenience to discuss in detail. He verbalized understanding. Briefly discussed gas cards and how to obtain.  They have my card for any additional financial questions or concerns.

## 2022-10-27 ENCOUNTER — Telehealth: Payer: Self-pay | Admitting: Hematology and Oncology

## 2022-10-27 ENCOUNTER — Other Ambulatory Visit: Payer: Self-pay | Admitting: Oncology

## 2022-10-27 ENCOUNTER — Encounter (HOSPITAL_COMMUNITY): Payer: Medicare Other

## 2022-10-27 MED FILL — Fosaprepitant Dimeglumine For IV Infusion 150 MG (Base Eq): INTRAVENOUS | Qty: 5 | Status: AC

## 2022-10-27 MED FILL — Dexamethasone Sodium Phosphate Inj 100 MG/10ML: INTRAMUSCULAR | Qty: 1 | Status: AC

## 2022-10-27 NOTE — Telephone Encounter (Signed)
Patient declined for [employer] at the moment, but will call back to schedule appointment when ready,

## 2022-10-27 NOTE — Progress Notes (Signed)
Gynecologic Oncology Multi-Disciplinary Disposition Conference Note  Date of the Conference: 10/27/2022  Patient Name: Maria Avila  Referring Provider: Dr. Henderson Cloud Primary GYN Oncologist: Dr. 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 Disposition is to 3 cycles of neoadjuvant chemotherapy with carboplatinTaxol/Herceptin followed by interval imaging and consideration for surgical debulking.   This Multidisciplinary conference took place involving physicians from Gynecologic Oncology, Medical Oncology, Radiation Oncology, Pathology, Radiology along with the Gynecologic Oncology Nurse Practitioner and Gynecologic Oncology Nurse Navigator.  Comprehensive assessment of the patient's malignancy, staging, need for surgery, chemotherapy, radiation therapy, and need for further testing were reviewed. Supportive measures, both inpatient and following discharge were also discussed. The recommended plan of care is documented. Greater than 35 minutes were spent correlating and coordinating this patient's care.

## 2022-10-28 ENCOUNTER — Other Ambulatory Visit: Payer: Self-pay

## 2022-10-28 ENCOUNTER — Inpatient Hospital Stay: Payer: Medicare Other

## 2022-10-28 VITALS — BP 135/78 | HR 61 | Temp 97.9°F | Resp 17

## 2022-10-28 DIAGNOSIS — Z87891 Personal history of nicotine dependence: Secondary | ICD-10-CM | POA: Diagnosis not present

## 2022-10-28 DIAGNOSIS — Z79899 Other long term (current) drug therapy: Secondary | ICD-10-CM | POA: Diagnosis not present

## 2022-10-28 DIAGNOSIS — Z5111 Encounter for antineoplastic chemotherapy: Secondary | ICD-10-CM | POA: Diagnosis not present

## 2022-10-28 DIAGNOSIS — R971 Elevated cancer antigen 125 [CA 125]: Secondary | ICD-10-CM | POA: Diagnosis not present

## 2022-10-28 DIAGNOSIS — K529 Noninfective gastroenteritis and colitis, unspecified: Secondary | ICD-10-CM | POA: Diagnosis not present

## 2022-10-28 DIAGNOSIS — Z7982 Long term (current) use of aspirin: Secondary | ICD-10-CM | POA: Diagnosis not present

## 2022-10-28 DIAGNOSIS — E785 Hyperlipidemia, unspecified: Secondary | ICD-10-CM | POA: Diagnosis not present

## 2022-10-28 DIAGNOSIS — Z5112 Encounter for antineoplastic immunotherapy: Secondary | ICD-10-CM | POA: Diagnosis not present

## 2022-10-28 DIAGNOSIS — Z800 Family history of malignant neoplasm of digestive organs: Secondary | ICD-10-CM | POA: Diagnosis not present

## 2022-10-28 DIAGNOSIS — C55 Malignant neoplasm of uterus, part unspecified: Secondary | ICD-10-CM

## 2022-10-28 DIAGNOSIS — I10 Essential (primary) hypertension: Secondary | ICD-10-CM | POA: Diagnosis not present

## 2022-10-28 DIAGNOSIS — Z8673 Personal history of transient ischemic attack (TIA), and cerebral infarction without residual deficits: Secondary | ICD-10-CM | POA: Diagnosis not present

## 2022-10-28 DIAGNOSIS — R599 Enlarged lymph nodes, unspecified: Secondary | ICD-10-CM | POA: Diagnosis not present

## 2022-10-28 MED ORDER — SODIUM CHLORIDE 0.9% FLUSH
10.0000 mL | INTRAVENOUS | Status: DC | PRN
  Administered 2022-10-28: 10 mL

## 2022-10-28 MED ORDER — TRASTUZUMAB-ANNS CHEMO 150 MG IV SOLR
8.0000 mg/kg | Freq: Once | INTRAVENOUS | Status: AC
  Administered 2022-10-28: 714 mg via INTRAVENOUS
  Filled 2022-10-28: qty 34

## 2022-10-28 MED ORDER — SODIUM CHLORIDE 0.9 % IV SOLN: Freq: Once | INTRAVENOUS | Status: AC

## 2022-10-28 MED ORDER — SODIUM CHLORIDE 0.9 % IV SOLN
482.0000 mg | Freq: Once | INTRAVENOUS | Status: AC
  Administered 2022-10-28: 480 mg via INTRAVENOUS
  Filled 2022-10-28: qty 48

## 2022-10-28 MED ORDER — SODIUM CHLORIDE 0.9 % IV SOLN
150.0000 mg | Freq: Once | INTRAVENOUS | Status: AC
  Administered 2022-10-28: 150 mg via INTRAVENOUS
  Filled 2022-10-28: qty 150

## 2022-10-28 MED ORDER — PALONOSETRON HCL INJECTION 0.25 MG/5ML
0.2500 mg | Freq: Once | INTRAVENOUS | Status: AC
  Administered 2022-10-28: 0.25 mg via INTRAVENOUS
  Filled 2022-10-28: qty 5

## 2022-10-28 MED ORDER — CETIRIZINE HCL 10 MG/ML IV SOLN
10.0000 mg | Freq: Once | INTRAVENOUS | Status: AC
  Administered 2022-10-28: 10 mg via INTRAVENOUS
  Filled 2022-10-28: qty 1

## 2022-10-28 MED ORDER — HEPARIN SOD (PORK) LOCK FLUSH 100 UNIT/ML IV SOLN
500.0000 [IU] | Freq: Once | INTRAVENOUS | Status: AC | PRN
  Administered 2022-10-28: 500 [IU]

## 2022-10-28 MED ORDER — SODIUM CHLORIDE 0.9 % IV SOLN
10.0000 mg | Freq: Once | INTRAVENOUS | Status: AC
  Administered 2022-10-28: 10 mg via INTRAVENOUS
  Filled 2022-10-28: qty 10

## 2022-10-28 MED ORDER — ACETAMINOPHEN 325 MG PO TABS
650.0000 mg | ORAL_TABLET | Freq: Once | ORAL | Status: AC
  Administered 2022-10-28: 650 mg via ORAL
  Filled 2022-10-28: qty 2

## 2022-10-28 MED ORDER — SODIUM CHLORIDE 0.9 % IV SOLN
175.0000 mg/m2 | Freq: Once | INTRAVENOUS | Status: AC
  Administered 2022-10-28: 348 mg via INTRAVENOUS
  Filled 2022-10-28: qty 58

## 2022-10-28 MED ORDER — FAMOTIDINE IN NACL 20-0.9 MG/50ML-% IV SOLN
20.0000 mg | Freq: Once | INTRAVENOUS | Status: AC
  Administered 2022-10-28: 20 mg via INTRAVENOUS
  Filled 2022-10-28: qty 50

## 2022-10-28 NOTE — Patient Instructions (Signed)
[facility]  Discharge Instructions: Thank you for choosing [facility] to provide your oncology and hematology care.   If you have a lab appointment with the Cancer Center, please go directly to the Cancer Center and check in at the registration area.   Wear comfortable clothing and clothing appropriate for easy access to any Portacath or PICC line.   We strive to give you quality time with your provider. You may need to reschedule your appointment if you arrive late (15 or more minutes).  Arriving late affects you and other patients whose appointments are after yours.  Also, if you miss three or more appointments without notifying the office, you may be dismissed from the clinic at the provider's discretion.      For prescription refill requests, have your pharmacy contact our office and allow 72 hours for refills to be completed.    Today you received the following chemotherapy and/or immunotherapy agents: Trastuzumab, Paclitaxel, Carboplatin      To help prevent nausea and vomiting after your treatment, we encourage you to take your nausea medication as directed.  BELOW ARE SYMPTOMS THAT SHOULD BE REPORTED IMMEDIATELY: *FEVER GREATER THAN 100.4 F (38 C) OR HIGHER *CHILLS OR SWEATING *NAUSEA AND VOMITING THAT IS NOT CONTROLLED WITH YOUR NAUSEA MEDICATION *UNUSUAL SHORTNESS OF BREATH *UNUSUAL BRUISING OR BLEEDING *URINARY PROBLEMS (pain or burning when urinating, or frequent urination) *BOWEL PROBLEMS (unusual diarrhea, constipation, pain near the anus) TENDERNESS IN MOUTH AND THROAT WITH OR WITHOUT PRESENCE OF ULCERS (sore throat, sores in mouth, or a toothache) UNUSUAL RASH, SWELLING OR PAIN  UNUSUAL VAGINAL DISCHARGE OR ITCHING   Items with * indicate a potential emergency and should be followed up as soon as possible or go to the Emergency Department if any problems should occur.  Please show the CHEMOTHERAPY ALERT CARD or  IMMUNOTHERAPY ALERT CARD at check-in to the Emergency Department and triage nurse.  Should you have questions after your visit or need to cancel or reschedule your appointment, please contact [facility]  Dept: [phone]  and follow the prompts.  Office hours are 8:00 a.m. to 4:30 p.m. Monday - Friday. Please note that voicemails left after 4:00 p.m. may not be returned until the following business day.  We are closed weekends and major holidays. You have access to a nurse at all times for urgent questions. Please call the main number to the clinic Dept: [phone] and follow the prompts.   For any non-urgent questions, you may also contact your provider using MyChart. We now offer e-Visits for anyone 71 and older to request care online for non-urgent symptoms. For details visit [email or web address].   Also download the MyChart app! Go to the app store, search "MyChart", open the app, select [place], and log in with your MyChart username and password.  Trastuzumab Injection What is this medication? TRASTUZUMAB (tras TOO zoo mab) treats breast cancer and stomach cancer. It works by blocking a protein that causes cancer cells to grow and multiply. This helps to slow or stop the spread of cancer cells. This medicine may be used for other purposes; ask your health care provider or pharmacist if you have questions. COMMON BRAND NAME(S): Herceptin, Marlowe Alt, Ontruzant, Trazimera What should I tell my care team before I take this medication? They need to know if you have any of these conditions: Heart failure Lung disease An unusual or allergic reaction to trastuzumab,  other medications, foods, dyes, or preservatives Pregnant or trying to get pregnant Breast-feeding How should I use this medication? This medication is injected into a vein. It is given by your care team in a hospital or clinic setting. Talk to your care team about the  use of this medication in children. It is not approved for use in children. Overdosage: If you think you have taken too much of this medicine contact a poison control center or emergency room at once. NOTE: This medicine is only for you. Do not share this medicine with others. What if I miss a dose? Keep appointments for follow-up doses. It is important not to miss your dose. Call your care team if you are unable to keep an appointment. What may interact with this medication? Certain types of chemotherapy, such as daunorubicin, doxorubicin, epirubicin, idarubicin This list may not describe all possible interactions. Give your health care provider a list of all the medicines, herbs, non-prescription drugs, or dietary supplements you use. Also tell them if you smoke, drink alcohol, or use illegal drugs. Some items may interact with your medicine. What should I watch for while using this medication? Your condition will be monitored carefully while you are receiving this medication. This medication may make you feel generally unwell. This is not uncommon, as chemotherapy affects healthy cells as well as cancer cells. Report any side effects. Continue your course of treatment even though you feel ill unless your care team tells you to stop. This medication may increase your risk of getting an infection. Call your care team for advice if you get a fever, chills, sore throat, or other symptoms of a cold or flu. Do not treat yourself. Try to avoid being around people who are sick. Avoid taking medications that contain aspirin, acetaminophen, ibuprofen, naproxen, or ketoprofen unless instructed by your care team. These medications can hide a fever. Talk to your care team if you may be pregnant. Serious birth defects can occur if you take this medication during pregnancy and for 7 months after the last dose. You will need a negative pregnancy test before starting this medication. Contraception is recommended  while taking this medication and for 7 months after the last dose. Your care team can help you find the option that works for you. Do not breastfeed while taking this medication and for 7 months after stopping treatment. What side effects may I notice from receiving this medication? Side effects that you should report to your care team as soon as possible: Allergic reactions or angioedema--skin rash, itching or hives, swelling of the face, eyes, lips, tongue, arms, or legs, trouble swallowing or breathing Dry cough, shortness of breath or trouble breathing Heart failure--shortness of breath, swelling of the ankles, feet, or hands, sudden weight gain, unusual weakness or fatigue Infection--fever, chills, cough, or sore throat Infusion reactions--chest pain, shortness of breath or trouble breathing, feeling faint or lightheaded Side effects that usually do not require medical attention (report to your care team if they continue or are bothersome): Diarrhea Dizziness Headache Nausea Trouble sleeping Vomiting This list may not describe all possible side effects. Call your doctor for medical advice about side effects. You may report side effects to FDA at 1-800-FDA-1088. Where should I keep my medication? This medication is given in a hospital or clinic. It will not be stored at home. NOTE: This sheet is a summary. It may not cover all possible information. If you have questions about this medicine, talk to your doctor, pharmacist, or  health care provider.  2024 Elsevier/Gold Standard (2021-09-03 00:00:00)  Paclitaxel Injection What is this medication? PACLITAXEL (PAK li TAX el) treats some types of cancer. It works by slowing down the growth of cancer cells. This medicine may be used for other purposes; ask your health care provider or pharmacist if you have questions. COMMON BRAND NAME(S): Onxol, Taxol What should I tell my care team before I take this medication? They need to know if you  have any of these conditions: Heart disease Liver disease Low white blood cell levels An unusual or allergic reaction to paclitaxel, other medications, foods, dyes, or preservatives If you or your partner are pregnant or trying to get pregnant Breast-feeding How should I use this medication? This medication is injected into a vein. It is given by your care team in a hospital or clinic setting. Talk to your care team about the use of this medication in children. While it may be given to children for selected conditions, precautions do apply. Overdosage: If you think you have taken too much of this medicine contact a poison control center or emergency room at once. NOTE: This medicine is only for you. Do not share this medicine with others. What if I miss a dose? Keep appointments for follow-up doses. It is important not to miss your dose. Call your care team if you are unable to keep an appointment. What may interact with this medication? Do not take this medication with any of the following: Live virus vaccines Other medications may affect the way this medication works. Talk with your care team about all of the medications you take. They may suggest changes to your treatment plan to lower the risk of side effects and to make sure your medications work as intended. This list may not describe all possible interactions. Give your health care provider a list of all the medicines, herbs, non-prescription drugs, or dietary supplements you use. Also tell them if you smoke, drink alcohol, or use illegal drugs. Some items may interact with your medicine. What should I watch for while using this medication? Your condition will be monitored carefully while you are receiving this medication. You may need blood work while taking this medication. This medication may make you feel generally unwell. This is not uncommon as chemotherapy can affect healthy cells as well as cancer cells. Report any side effects.  Continue your course of treatment even though you feel ill unless your care team tells you to stop. This medication can cause serious allergic reactions. To reduce the risk, your care team may give you other medications to take before receiving this one. Be sure to follow the directions from your care team. This medication may increase your risk of getting an infection. Call your care team for advice if you get a fever, chills, sore throat, or other symptoms of a cold or flu. Do not treat yourself. Try to avoid being around people who are sick. This medication may increase your risk to bruise or bleed. Call your care team if you notice any unusual bleeding. Be careful brushing or flossing your teeth or using a toothpick because you may get an infection or bleed more easily. If you have any dental work done, tell your dentist you are receiving this medication. Talk to your care team if you may be pregnant. Serious birth defects can occur if you take this medication during pregnancy. Talk to your care team before breastfeeding. Changes to your treatment plan may be needed. What side effects  may I notice from receiving this medication? Side effects that you should report to your care team as soon as possible: Allergic reactions--skin rash, itching, hives, swelling of the face, lips, tongue, or throat Heart rhythm changes--fast or irregular heartbeat, dizziness, feeling faint or lightheaded, chest pain, trouble breathing Increase in blood pressure Infection--fever, chills, cough, sore throat, wounds that don't heal, pain or trouble when passing urine, general feeling of discomfort or being unwell Low blood pressure--dizziness, feeling faint or lightheaded, blurry vision Low red blood cell level--unusual weakness or fatigue, dizziness, headache, trouble breathing Painful swelling, warmth, or redness of the skin, blisters or sores at the infusion site Pain, tingling, or numbness in the hands or feet Slow  heartbeat--dizziness, feeling faint or lightheaded, confusion, trouble breathing, unusual weakness or fatigue Unusual bruising or bleeding Side effects that usually do not require medical attention (report to your care team if they continue or are bothersome): Diarrhea Hair loss Joint pain Loss of appetite Muscle pain Nausea Vomiting This list may not describe all possible side effects. Call your doctor for medical advice about side effects. You may report side effects to FDA at 1-800-FDA-1088. Where should I keep my medication? This medication is given in a hospital or clinic. It will not be stored at home. NOTE: This sheet is a summary. It may not cover all possible information. If you have questions about this medicine, talk to your doctor, pharmacist, or health care provider.  2024 Elsevier/Gold Standard (2021-09-10 00:00:00)  Carboplatin Injection What is this medication? CARBOPLATIN (KAR boe pla tin) treats some types of cancer. It works by slowing down the growth of cancer cells. This medicine may be used for other purposes; ask your health care provider or pharmacist if you have questions. COMMON BRAND NAME(S): Paraplatin What should I tell my care team before I take this medication? They need to know if you have any of these conditions: Blood disorders Hearing problems Kidney disease Recent or ongoing radiation therapy An unusual or allergic reaction to carboplatin, cisplatin, other medications, foods, dyes, or preservatives Pregnant or trying to get pregnant Breast-feeding How should I use this medication? This medication is injected into a vein. It is given by your care team in a hospital or clinic setting. Talk to your care team about the use of this medication in children. Special care may be needed. Overdosage: If you think you have taken too much of this medicine contact a poison control center or emergency room at once. NOTE: This medicine is only for you. Do not  share this medicine with others. What if I miss a dose? Keep appointments for follow-up doses. It is important not to miss your dose. Call your care team if you are unable to keep an appointment. What may interact with this medication? Medications for seizures Some antibiotics, such as amikacin, gentamicin, neomycin, streptomycin, tobramycin Vaccines This list may not describe all possible interactions. Give your health care provider a list of all the medicines, herbs, non-prescription drugs, or dietary supplements you use. Also tell them if you smoke, drink alcohol, or use illegal drugs. Some items may interact with your medicine. What should I watch for while using this medication? Your condition will be monitored carefully while you are receiving this medication. You may need blood work while taking this medication. This medication may make you feel generally unwell. This is not uncommon, as chemotherapy can affect healthy cells as well as cancer cells. Report any side effects. Continue your course of treatment even though  you feel ill unless your care team tells you to stop. In some cases, you may be given additional medications to help with side effects. Follow all directions for their use. This medication may increase your risk of getting an infection. Call your care team for advice if you get a fever, chills, sore throat, or other symptoms of a cold or flu. Do not treat yourself. Try to avoid being around people who are sick. Avoid taking medications that contain aspirin, acetaminophen, ibuprofen, naproxen, or ketoprofen unless instructed by your care team. These medications may hide a fever. Be careful brushing or flossing your teeth or using a toothpick because you may get an infection or bleed more easily. If you have any dental work done, tell your dentist you are receiving this medication. Talk to your care team if you wish to become pregnant or think you might be pregnant. This medication  can cause serious birth defects. Talk to your care team about effective forms of contraception. Do not breast-feed while taking this medication. What side effects may I notice from receiving this medication? Side effects that you should report to your care team as soon as possible: Allergic reactions--skin rash, itching, hives, swelling of the face, lips, tongue, or throat Infection--fever, chills, cough, sore throat, wounds that don't heal, pain or trouble when passing urine, general feeling of discomfort or being unwell Low red blood cell level--unusual weakness or fatigue, dizziness, headache, trouble breathing Pain, tingling, or numbness in the hands or feet, muscle weakness, change in vision, confusion or trouble speaking, loss of balance or coordination, trouble walking, seizures Unusual bruising or bleeding Side effects that usually do not require medical attention (report to your care team if they continue or are bothersome): Hair loss Nausea Unusual weakness or fatigue Vomiting This list may not describe all possible side effects. Call your doctor for medical advice about side effects. You may report side effects to FDA at 1-800-FDA-1088. Where should I keep my medication? This medication is given in a hospital or clinic. It will not be stored at home. NOTE: This sheet is a summary. It may not cover all possible information. If you have questions about this medicine, talk to your doctor, pharmacist, or health care provider.  2024 Elsevier/Gold Standard (2021-08-13 00:00:00)

## 2022-10-29 ENCOUNTER — Encounter: Payer: Self-pay | Admitting: Hematology and Oncology

## 2022-10-29 NOTE — Progress Notes (Signed)
Informed by registration patient did not bring her documentation for [employer] grant on 10/28/22. Her next appointment is 11/18/22. She may bring to apply on that day.  She has my card for any additional financial questions or concerns.

## 2022-10-31 ENCOUNTER — Telehealth: Payer: Self-pay

## 2022-10-31 ENCOUNTER — Other Ambulatory Visit: Payer: Self-pay | Admitting: Hematology and Oncology

## 2022-10-31 DIAGNOSIS — C55 Malignant neoplasm of uterus, part unspecified: Secondary | ICD-10-CM

## 2022-10-31 MED ORDER — OXYCODONE HCL 5 MG PO TABS: 5.0000 mg | ORAL_TABLET | Freq: Four times a day (QID) | ORAL | 0 refills | Status: DC | PRN

## 2022-10-31 NOTE — Telephone Encounter (Signed)
Returned call and spoke with Cyndal and husband. Following up after 1st treatment on 6/25. She is able to eat and drink with no problems. Denies constipation. Yesterday she started having pain in her back and abdomen. She is taking tylenol but not getting any relief.  She is asking if Dr. Bertis Ruddy can sent Rx for pain to [facility] on [address]?

## 2022-10-31 NOTE — Telephone Encounter (Signed)
Called and given below message. She verbalized understanding and appreciated the call. She will call the office back Monday to give update.

## 2022-10-31 NOTE — Telephone Encounter (Signed)
I sent prescription to [facility] ox[facility]one for pain She can take it with tylenol for better pain control Call back Monday

## 2022-11-03 ENCOUNTER — Other Ambulatory Visit: Payer: Self-pay

## 2022-11-03 ENCOUNTER — Telehealth: Payer: Self-pay

## 2022-11-03 NOTE — Telephone Encounter (Signed)
She called to give update on her pain. She is taking the Oxycodone and her pain is a lot better.  She will call the office back for questions/ concerns.

## 2022-11-05 ENCOUNTER — Inpatient Hospital Stay: Admit: 2022-11-05 | Payer: Medicare Other | Admitting: Gynecologic Oncology

## 2022-11-05 DIAGNOSIS — C541 Malignant neoplasm of endometrium: Secondary | ICD-10-CM

## 2022-11-05 SURGERY — XI ROBOTIC ASSISTED TOTAL HYSTERECTOMY BILATERAL SALPINGO OOPHORECTOMY WITH OMENTECTOMY AND DEBULKING
Anesthesia: General

## 2022-11-17 MED FILL — Dexamethasone Sodium Phosphate Inj 100 MG/10ML: INTRAMUSCULAR | Qty: 1 | Status: AC

## 2022-11-17 MED FILL — Fosaprepitant Dimeglumine For IV Infusion 150 MG (Base Eq): INTRAVENOUS | Qty: 5 | Status: AC

## 2022-11-18 ENCOUNTER — Encounter: Payer: Self-pay | Admitting: Hematology and Oncology

## 2022-11-18 ENCOUNTER — Inpatient Hospital Stay: Payer: Medicare Other | Attending: Gynecologic Oncology

## 2022-11-18 ENCOUNTER — Inpatient Hospital Stay (HOSPITAL_BASED_OUTPATIENT_CLINIC_OR_DEPARTMENT_OTHER): Payer: Medicare Other | Admitting: Hematology and Oncology

## 2022-11-18 ENCOUNTER — Inpatient Hospital Stay: Payer: Medicare Other

## 2022-11-18 ENCOUNTER — Other Ambulatory Visit: Payer: Self-pay

## 2022-11-18 VITALS — BP 156/83 | HR 50 | Resp 16

## 2022-11-18 DIAGNOSIS — C55 Malignant neoplasm of uterus, part unspecified: Secondary | ICD-10-CM | POA: Insufficient documentation

## 2022-11-18 DIAGNOSIS — Z5112 Encounter for antineoplastic immunotherapy: Secondary | ICD-10-CM | POA: Insufficient documentation

## 2022-11-18 DIAGNOSIS — Z5111 Encounter for antineoplastic chemotherapy: Secondary | ICD-10-CM | POA: Insufficient documentation

## 2022-11-18 LAB — CBC WITH DIFFERENTIAL (CANCER CENTER ONLY)
Abs Immature Granulocytes: 0.06 10*3/uL (ref 0.00–0.07)
Basophils Absolute: 0 10*3/uL (ref 0.0–0.1)
Basophils Relative: 0 %
Eosinophils Absolute: 0 10*3/uL (ref 0.0–0.5)
Eosinophils Relative: 0 %
HCT: 36.4 % (ref 36.0–46.0)
Hemoglobin: 13 g/dL (ref 12.0–15.0)
Immature Granulocytes: 1 %
Lymphocytes Relative: 32 %
Lymphs Abs: 1.5 10*3/uL (ref 0.7–4.0)
MCH: 33.4 pg (ref 26.0–34.0)
MCHC: 35.7 g/dL (ref 30.0–36.0)
MCV: 93.6 fL (ref 80.0–100.0)
Monocytes Absolute: 0.1 10*3/uL (ref 0.1–1.0)
Monocytes Relative: 1 %
Neutro Abs: 3.1 10*3/uL (ref 1.7–7.7)
Neutrophils Relative %: 66 %
Platelet Count: 162 10*3/uL (ref 150–400)
RBC: 3.89 MIL/uL (ref 3.87–5.11)
RDW: 11.4 % — ABNORMAL LOW (ref 11.5–15.5)
WBC Count: 4.7 10*3/uL (ref 4.0–10.5)
nRBC: 0 % (ref 0.0–0.2)

## 2022-11-18 LAB — CMP (CANCER CENTER ONLY)
ALT: 19 U/L (ref 0–44)
AST: 18 U/L (ref 15–41)
Albumin: 4.4 g/dL (ref 3.5–5.0)
Alkaline Phosphatase: 69 U/L (ref 38–126)
Anion gap: 10 (ref 5–15)
BUN: 18 mg/dL (ref 8–23)
CO2: 25 mmol/L (ref 22–32)
Calcium: 10.3 mg/dL (ref 8.9–10.3)
Chloride: 103 mmol/L (ref 98–111)
Creatinine: 0.82 mg/dL (ref 0.44–1.00)
GFR, Estimated: >60 mL/min (ref >60)
Glucose, Bld: 187 mg/dL — ABNORMAL HIGH (ref 70–99)
Potassium: 3.5 mmol/L (ref 3.5–5.1)
Sodium: 138 mmol/L (ref 135–145)
Total Bilirubin: 0.3 mg/dL (ref 0.3–1.2)
Total Protein: 7.8 g/dL (ref 6.5–8.1)

## 2022-11-18 MED ORDER — SODIUM CHLORIDE 0.9% FLUSH
10.0000 mL | Freq: Once | INTRAVENOUS | Status: AC
  Administered 2022-11-18: 10 mL

## 2022-11-18 MED ORDER — SODIUM CHLORIDE 0.9% FLUSH
10.0000 mL | INTRAVENOUS | Status: DC | PRN
  Administered 2022-11-18: 10 mL

## 2022-11-18 MED ORDER — TRASTUZUMAB-ANNS CHEMO 150 MG IV SOLR
6.0000 mg/kg | Freq: Once | INTRAVENOUS | Status: AC
  Administered 2022-11-18: 525 mg via INTRAVENOUS
  Filled 2022-11-18: qty 25

## 2022-11-18 MED ORDER — SODIUM CHLORIDE 0.9 % IV SOLN
175.0000 mg/m2 | Freq: Once | INTRAVENOUS | Status: AC
  Administered 2022-11-18: 348 mg via INTRAVENOUS
  Filled 2022-11-18: qty 58

## 2022-11-18 MED ORDER — PALONOSETRON HCL INJECTION 0.25 MG/5ML
0.2500 mg | Freq: Once | INTRAVENOUS | Status: AC
  Administered 2022-11-18: 0.25 mg via INTRAVENOUS
  Filled 2022-11-18: qty 5

## 2022-11-18 MED ORDER — SODIUM CHLORIDE 0.9 % IV SOLN
150.0000 mg | Freq: Once | INTRAVENOUS | Status: AC
  Administered 2022-11-18: 150 mg via INTRAVENOUS
  Filled 2022-11-18: qty 150

## 2022-11-18 MED ORDER — OXYCODONE HCL 5 MG PO TABS: 5.0000 mg | ORAL_TABLET | Freq: Four times a day (QID) | ORAL | 0 refills | Status: DC | PRN

## 2022-11-18 MED ORDER — FAMOTIDINE IN NACL 20-0.9 MG/50ML-% IV SOLN
20.0000 mg | Freq: Once | INTRAVENOUS | Status: AC
  Administered 2022-11-18: 20 mg via INTRAVENOUS
  Filled 2022-11-18: qty 50

## 2022-11-18 MED ORDER — HEPARIN SOD (PORK) LOCK FLUSH 100 UNIT/ML IV SOLN
500.0000 [IU] | Freq: Once | INTRAVENOUS | Status: AC | PRN
  Administered 2022-11-18: 500 [IU]

## 2022-11-18 MED ORDER — SODIUM CHLORIDE 0.9 % IV SOLN
482.0000 mg | Freq: Once | INTRAVENOUS | Status: AC
  Administered 2022-11-18: 480 mg via INTRAVENOUS
  Filled 2022-11-18: qty 48

## 2022-11-18 MED ORDER — SODIUM CHLORIDE 0.9 % IV SOLN: Freq: Once | INTRAVENOUS | Status: AC

## 2022-11-18 MED ORDER — SODIUM CHLORIDE 0.9 % IV SOLN
10.0000 mg | Freq: Once | INTRAVENOUS | Status: AC
  Administered 2022-11-18: 10 mg via INTRAVENOUS
  Filled 2022-11-18: qty 10

## 2022-11-18 MED ORDER — ACETAMINOPHEN 325 MG PO TABS
650.0000 mg | ORAL_TABLET | Freq: Once | ORAL | Status: AC
  Administered 2022-11-18: 650 mg via ORAL
  Filled 2022-11-18: qty 2

## 2022-11-18 MED ORDER — CETIRIZINE HCL 10 MG/ML IV SOLN
10.0000 mg | Freq: Once | INTRAVENOUS | Status: AC
  Administered 2022-11-18: 10 mg via INTRAVENOUS
  Filled 2022-11-18: qty 1

## 2022-11-18 NOTE — Assessment & Plan Note (Signed)
Overall, she tolerated treatment very well except for arthralgia for few days after treatment that is well-controlled with oxycodone I refilled her prescription today I plan to order imaging study after cycle 3

## 2022-11-18 NOTE — Progress Notes (Signed)
[facility] OFFICE PROGRESS NOTE  Patient Care Team: Merri Brunette, MD as PCP - General (Internal Medicine)  ASSESSMENT & PLAN:  Uterine cancer (HCC) Overall, she tolerated treatment very well except for arthralgia for few days after treatment that is well-controlled with oxycodone I refilled her prescription today I plan to order imaging study after cycle 3  No orders of the defined types were placed in this encounter.   All questions were answered. The patient knows to call the clinic with any problems, questions or concerns. The total time spent in the appointment was 20 minutes encounter with patients including review of chart and various tests results, discussions about plan of care and coordination of care plan   Artis Delay, MD 11/18/2022 8:56 AM  INTERVAL HISTORY: Please see below for problem oriented charting. she returns for treatment follow-up seen prior to cycle 2 of treatment She is doing well and tolerated treatment well She has some mild arthralgias lasting a few days after treatment, well-controlled with oxycodone Denies peripheral neuropathy Denies nausea or changes in bowel habits  REVIEW OF SYSTEMS:   Constitutional: Denies fevers, chills or abnormal weight loss Eyes: Denies blurriness of vision Ears, nose, mouth, throat, and face: Denies mucositis or sore throat Respiratory: Denies cough, dyspnea or wheezes Cardiovascular: Denies palpitation, chest discomfort or lower extremity swelling Gastrointestinal:  Denies nausea, heartburn or change in bowel habits Skin: Denies abnormal skin rashes Lymphatics: Denies new lymphadenopathy or easy bruising Neurological:Denies numbness, tingling or new weaknesses Behavioral/Psych: Mood is stable, no new changes  All other systems were reviewed with the patient and are negative.  I have reviewed the past medical history, past surgical history, social history and family history with the patient and they are  unchanged from previous note.  ALLERGIES:  is allergic to crestor [rosuvastatin calcium] and zocor [simvastatin].  MEDICATIONS:  Current Outpatient Medications  Medication Sig Dispense Refill   acetaminophen (TYLENOL) 325 MG tablet Take 1-2 tablets (325-650 mg total) by mouth every 4 (four) hours as needed for mild pain.     aspirin EC 81 MG tablet Take 81 mg by mouth daily. OTC     benazepril-hydrochlorthiazide (LOTENSIN HCT) 20-25 MG tablet Take 1 tablet by mouth daily.     [employer] VITAMIN C 500 MG tablet TAKE 1 TABLET BY MOUTH TWICE A DAY 100 tablet 0   dexamethasone (DECADRON) 4 MG tablet Take 2 tabs at the night before and 2 tab the morning of chemotherapy, every 3 weeks, by mouth x 6 cycles 36 tablet 6   ezetimibe (ZETIA) 10 MG tablet Take 10 mg by mouth daily.     hydrochlorothiazide (HYDRODIURIL) 12.5 MG tablet Take 12.5 mg by mouth daily.     lidocaine-prilocaine (EMLA) cream Apply to affected area once 30 g 3   Multiple Vitamin (MULTIVITAMIN) capsule Take 1 capsule by mouth daily.     ondansetron (ZOFRAN) 8 MG tablet Take 1 tablet (8 mg total) by mouth every 8 (eight) hours as needed for nausea. 30 tablet 3   oxyCODONE (OXY IR/ROXICODONE) 5 MG immediate release tablet Take 1 tablet (5 mg total) by mouth every 6 (six) hours as needed for severe pain. 30 tablet 0   pantoprazole (PROTONIX) 40 MG tablet Take 40 mg by mouth 2 (two) times daily. (Patient not taking: Reported on 10/16/2022)     prochlorperazine (COMPAZINE) 10 MG tablet Take 1 tablet (10 mg total) by mouth every 6 (six) hours as needed for nausea or vomiting. 30 tablet  1   thiamine (VITAMIN B-1) 100 MG tablet TAKE 1 TABLET BY MOUTH EVERY DAY 30 tablet 0   No current facility-administered medications for this visit.    SUMMARY OF ONCOLOGIC HISTORY: Oncology History Overview Note  MMR normal, Her2 positive   Uterine cancer (HCC)  08/12/2022 Initial Diagnosis   She presented with PMB   09/24/2022 Pathology Results    Diagnosis 1. Endometrium, curettage - HIGH GRADE SEROUS CARCINOMA (SEE NOTE) 2. Endometrium, resection - HIGH GRADE SEROUS CARCINOMA (SEE NOTE) Diagnosis Note 1. - immunohistochemical stain reveal tumor cells are positive for p16. P53 shows strong expression consistent with mutant type. This case was reviewed with Dr. Reynolds Bowl who agrees with the diagnosis   10/14/2022 Imaging   1. Ill-defined fullness of the uterus and endometrial cavity without discretely visualized mass, in keeping with patient's known endometrial malignancy. 2. Enlarged right iliac and pelvic sidewall lymph nodes, as well as enlarged epicardial lymph nodes or soft tissue nodules. 3. Scattered peritoneal and omental soft tissue nodularity, particularly notable in the left upper quadrant. 4. Soft tissue matting about the distal sigmoid colon and rectum in the low pelvis. 5. Constellation of findings is consistent with nodal, peritoneal, and omental metastatic disease. 6. Small right pleural effusion, nonspecific although modestly suspicious for a malignant effusion, however without directly visualized pleural mass or nodularity. 7. Pancolonic diverticulosis, severe in the descending and sigmoid colon. 8. Cholelithiasis. 9. Coronary artery disease.   10/17/2022 Initial Diagnosis   Uterine cancer (HCC)   10/17/2022 Cancer Staging   Staging form: Corpus Uteri - Carcinoma and Carcinosarcoma, AJCC 8th Edition - Clinical stage from 10/17/2022: FIGO Stage IVB (cT1b, cN1, cM1) - Signed by Artis Delay, MD on 10/17/2022 Stage prefix: Initial diagnosis   10/23/2022 Echocardiogram   1. Left ventricular ejection fraction, by estimation, is 65 to 70%. Left ventricular ejection fraction by 2D MOD biplane is 68.1 %. The left ventricle has normal function. The left ventricle has no regional wall motion abnormalities. There is mild left ventricular hypertrophy. Left ventricular diastolic parameters are consistent with Grade I diastolic dysfunction  (impaired relaxation).  2. Right ventricular systolic function is normal. The right ventricular size is normal. There is normal pulmonary artery systolic pressure. The estimated right ventricular systolic pressure is 27.6 mmHg.  3. The mitral valve is abnormal. Trivial mitral valve regurgitation.  4. The aortic valve is tricuspid. Aortic valve regurgitation is not visualized. Aortic valve sclerosis is present, with no evidence of aortic valve stenosis.  5. The inferior vena cava is normal in size with greater than 50% respiratory variability, suggesting right atrial pressure of 3 mmHg.   10/28/2022 -  Chemotherapy   Patient is on Treatment Plan : UTERINE SEROUS CARCINOMA Carboplatin + Paclitaxel + Trastuzumab q21d x 6 Cycles / Trastuzumab q21d       PHYSICAL EXAMINATION: ECOG PERFORMANCE STATUS: 1 - Symptomatic but completely ambulatory  Vitals:   11/18/22 0838  BP: (!) 149/78  Pulse: (!) 59  Resp: 18  Temp: 98.2 F (36.8 C)  SpO2: 99%   Filed Weights   11/18/22 0838  Weight: 191 lb 3.2 oz (86.7 kg)    GENERAL:alert, no distress and comfortable NEURO: alert & oriented x 3 with fluent speech, no focal motor/sensory deficits  LABORATORY DATA:  I have reviewed the data as listed    Component Value Date/Time   NA 139 10/23/2022 1353   NA 144 05/13/2018 1500   K 3.7 10/23/2022 1353   CL 104 10/23/2022 1353  CO2 28 10/23/2022 1353   GLUCOSE 94 10/23/2022 1353   BUN 15 10/23/2022 1353   BUN 13 05/13/2018 1500   CREATININE 0.99 10/23/2022 1353   CALCIUM 9.9 10/23/2022 1353   PROT 6.7 10/23/2022 1353   ALBUMIN 3.9 10/23/2022 1353   AST 18 10/23/2022 1353   ALT 13 10/23/2022 1353   ALKPHOS 60 10/23/2022 1353   BILITOT 0.4 10/23/2022 1353   GFRNONAA >60 10/23/2022 1353   GFRAA 110 05/13/2018 1500   GFRAA >60 07/21/2017 1131    No results found for: "SPEP", "UPEP"  Lab Results  Component Value Date   WBC 4.7 11/18/2022   NEUTROABS 3.1 11/18/2022   HGB 13.0 11/18/2022    HCT 36.4 11/18/2022   MCV 93.6 11/18/2022   PLT 162 11/18/2022      Chemistry      Component Value Date/Time   NA 139 10/23/2022 1353   NA 144 05/13/2018 1500   K 3.7 10/23/2022 1353   CL 104 10/23/2022 1353   CO2 28 10/23/2022 1353   BUN 15 10/23/2022 1353   BUN 13 05/13/2018 1500   CREATININE 0.99 10/23/2022 1353      Component Value Date/Time   CALCIUM 9.9 10/23/2022 1353   ALKPHOS 60 10/23/2022 1353   AST 18 10/23/2022 1353   ALT 13 10/23/2022 1353   BILITOT 0.4 10/23/2022 1353       RADIOGRAPHIC STUDIES: I have personally reviewed the radiological images as listed and agreed with the findings in the report. ECHOCARDIOGRAM COMPLETE  Result Date: 10/23/2022    ECHOCARDIOGRAM REPORT   Patient Name:   Maria Avila Date of Exam: 10/23/2022 Medical Rec #:  [id number]     Height:       62.0 in Accession #:    [id number]    Weight:       190.0 lb Date of Birth:  [birth year]     BSA:          1.870 m Patient Age:    71 years      BP:           129/61 mmHg Patient Gender: F             HR:           78 bpm. Exam Location:  Outpatient Procedure: 2D Echo, Color Doppler and Cardiac Doppler Indications:    Z09 Chemo  History:        Patient has prior history of Echocardiogram examinations.                 Stroke; Risk Factors:Hypertension and Dyslipidemia.  Sonographer:    L. Thornton-Maynard Referring Phys: [id number] Latrina Guttman [facility]  Sonographer Comments: Suboptimal apical window. Therefore strain values could not be accurately assessed. and Image acquisition challenging due to patient body habitus. Global longitudinal strain was attempted. IMPRESSIONS  1. Left ventricular ejection fraction, by estimation, is 65 to 70%. Left ventricular ejection fraction by 2D MOD biplane is 68.1 %. The left ventricle has normal function. The left ventricle has no regional wall motion abnormalities. There is mild left ventricular hypertrophy. Left ventricular diastolic parameters are consistent with Grade I  diastolic dysfunction (impaired relaxation).  2. Right ventricular systolic function is normal. The right ventricular size is normal. There is normal pulmonary artery systolic pressure. The estimated right ventricular systolic pressure is 27.6 mmHg.  3. The mitral valve is abnormal. Trivial mitral valve regurgitation.  4. The aortic valve is tricuspid. Aortic valve regurgitation is  not visualized. Aortic valve sclerosis is present, with no evidence of aortic valve stenosis.  5. The inferior vena cava is normal in size with greater than 50% respiratory variability, suggesting right atrial pressure of 3 mmHg. Comparison(s): No significant change from prior study. 03/10/2018: LVEF 65-70%, grade 1 DD. FINDINGS  Left Ventricle: Left ventricular ejection fraction, by estimation, is 65 to 70%. Left ventricular ejection fraction by 2D MOD biplane is 68.1 %. The left ventricle has normal function. The left ventricle has no regional wall motion abnormalities. Global  longitudinal strain performed but not reported based on interpreter judgement due to suboptimal tracking. The left ventricular internal cavity size was normal in size. There is mild left ventricular hypertrophy. Left ventricular diastolic parameters are  consistent with Grade I diastolic dysfunction (impaired relaxation). Indeterminate filling pressures. Right Ventricle: The right ventricular size is normal. No increase in right ventricular wall thickness. Right ventricular systolic function is normal. There is normal pulmonary artery systolic pressure. The tricuspid regurgitant velocity is 2.48 m/s, and  with an assumed right atrial pressure of 3 mmHg, the estimated right ventricular systolic pressure is 27.6 mmHg. Left Atrium: Left atrial size was normal in size. Right Atrium: Right atrial size was normal in size. Pericardium: There is no evidence of pericardial effusion. Mitral Valve: The mitral valve is abnormal. Mild mitral annular calcification. Trivial  mitral valve regurgitation. Tricuspid Valve: The tricuspid valve is grossly normal. Tricuspid valve regurgitation is trivial. Aortic Valve: The aortic valve is tricuspid. Aortic valve regurgitation is not visualized. Aortic valve sclerosis is present, with no evidence of aortic valve stenosis. Aortic valve mean gradient measures 4.0 mmHg. Aortic valve peak gradient measures 7.1  mmHg. Aortic valve area, by VTI measures 2.21 cm. Pulmonic Valve: The pulmonic valve was grossly normal. Pulmonic valve regurgitation is trivial. Aorta: The aortic root and ascending aorta are structurally normal, with no evidence of dilitation. Venous: The inferior vena cava is normal in size with greater than 50% respiratory variability, suggesting right atrial pressure of 3 mmHg. IAS/Shunts: No atrial level shunt detected by color flow Doppler.  LEFT VENTRICLE PLAX 2D                        Biplane EF (MOD) LVIDd:         2.70 cm         LV Biplane EF:   Left LVIDs:         1.90 cm                          ventricular LV PW:         1.10 cm                          ejection LV IVS:        1.10 cm                          fraction by LVOT diam:     1.80 cm                          2D MOD LV SV:         54  biplane is LV SV Index:   29                               68.1 %. LVOT Area:     2.54 cm                                Diastology                                LV e' medial:    7.45 cm/s LV Volumes (MOD)               LV E/e' medial:  9.1 LV vol d, MOD    39.8 ml       LV e' lateral:   10.80 cm/s A2C:                           LV E/e' lateral: 6.3 LV vol d, MOD    56.8 ml A4C: LV vol s, MOD    14.4 ml A2C: LV vol s, MOD    16.3 ml A4C: LV SV MOD A2C:   25.4 ml LV SV MOD A4C:   56.8 ml LV SV MOD BP:    32.4 ml RIGHT VENTRICLE             IVC RV Basal diam:  3.70 cm     IVC diam: 0.80 cm RV S prime:     11.00 cm/s TAPSE (M-mode): 1.9 cm LEFT ATRIUM             Index        RIGHT ATRIUM           Index LA  diam:        3.50 cm 1.87 cm/m   RA Area:     16.90 cm LA Vol (A2C):   35.1 ml 18.77 ml/m  RA Volume:   40.90 ml  21.87 ml/m LA Vol (A4C):   34.9 ml 18.66 ml/m LA Biplane Vol: 36.8 ml 19.67 ml/m  AORTIC VALVE                    PULMONIC VALVE AV Area (Vmax):    1.88 cm     PV Vmax:          0.88 m/s AV Area (Vmean):   1.88 cm     PV Peak grad:     3.1 mmHg AV Area (VTI):     2.21 cm     PR End Diast Vel: 7.29 msec AV Vmax:           133.50 cm/s AV Vmean:          92.400 cm/s AV VTI:            0.246 m AV Peak Grad:      7.1 mmHg AV Mean Grad:      4.0 mmHg LVOT Vmax:         98.50 cm/s LVOT Vmean:        68.100 cm/s LVOT VTI:          0.213 m LVOT/AV VTI ratio: 0.87  AORTA Ao Root diam: 3.20 cm Ao Asc diam:  3.30 cm MITRAL VALVE  TRICUSPID VALVE MV Area (PHT): 2.37 cm    TR Peak grad:   24.6 mmHg MV Decel Time: 320 msec    TR Vmax:        248.00 cm/s MV E velocity: 67.60 cm/s MV A velocity: 68.70 cm/s  SHUNTS MV E/A ratio:  0.98        Systemic VTI:  0.21 m                            Systemic Diam: 1.80 cm Zoila Shutter MD Electronically signed by Zoila Shutter MD Signature Date/Time: 10/23/2022/12:10:05 PM    Final    IR IMAGING GUIDED PORT INSERTION  Result Date: 10/21/2022 CLINICAL DATA:  Metastatic uterine carcinoma and need for porta cath to begin chemotherapy. EXAM: IMPLANTED PORT A CATH PLACEMENT WITH ULTRASOUND AND FLUOROSCOPIC GUIDANCE ANESTHESIA/SEDATION: Moderate (conscious) sedation was employed during this procedure. A total of Versed 1.0 mg and Fentanyl 50 mcg was administered intravenously. Moderate Sedation Time: 33 minutes. The patient's level of consciousness and vital signs were monitored continuously by radiology nursing throughout the procedure under my direct supervision. FLUOROSCOPY: 24 seconds.  3.0 mGy PROCEDURE: The procedure, risks, benefits, and alternatives were explained to the patient. Questions regarding the procedure were encouraged and answered. The patient  understands and consents to the procedure. A time-out was performed prior to initiating the procedure. Ultrasound was utilized to confirm patency of the right internal jugular vein. A permanent ultrasound image was saved and record. The right neck and chest were prepped with chlorhexidine in a sterile fashion, and a sterile drape was applied covering the operative field. Maximum barrier sterile technique with sterile gowns and gloves were used for the procedure. Local anesthesia was provided with 1% lidocaine. After creating a small venotomy incision, a 21 gauge needle was advanced into the right internal jugular vein under direct, real-time ultrasound guidance. Ultrasound image documentation was performed. After securing guidewire access, an 8 Fr dilator was placed. A J-wire was kinked to measure appropriate catheter length. A subcutaneous port pocket was then created along the upper chest wall utilizing sharp and blunt dissection. Portable cautery was utilized. The pocket was irrigated with sterile saline. A single lumen power injectable port was chosen for placement. The 8 Fr catheter was tunneled from the port pocket site to the venotomy incision. The port was placed in the pocket. External catheter was trimmed to appropriate length based on guidewire measurement. At the venotomy, an 8 Fr peel-away sheath was placed over a guidewire. The catheter was then placed through the sheath and the sheath removed. Final catheter positioning was confirmed and documented with a fluoroscopic spot image. The port was accessed with a needle and aspirated and flushed with heparinized saline. The access needle was removed. The venotomy and port pocket incisions were closed with subcutaneous 3-0 Monocryl and subcuticular 4-0 Vicryl. Dermabond was applied to both incisions. COMPLICATIONS: COMPLICATIONS None FINDINGS: After catheter placement, the tip lies at the cavo-atrial junction. The catheter aspirates normally and is ready  for immediate use. IMPRESSION: Placement of single lumen port a cath via right internal jugular vein. The catheter tip lies at the cavo-atrial junction. A power injectable port a cath was placed and is ready for immediate use. Electronically Signed   By: Irish Lack M.D.   On: 10/21/2022 13:44

## 2022-11-18 NOTE — Patient Instructions (Signed)
[place] [facility]   Discharge Instructions: Thank you for choosing [facility] to provide your oncology and hematology care.   If you have a lab appointment with the Cancer Center, please go directly to the Cancer Center and check in at the registration area.   Wear comfortable clothing and clothing appropriate for easy access to any Portacath or PICC line.   We strive to give you quality time with your provider. You may need to reschedule your appointment if you arrive late (15 or more minutes).  Arriving late affects you and other patients whose appointments are after yours.  Also, if you miss three or more appointments without notifying the office, you may be dismissed from the clinic at the provider's discretion.      For prescription refill requests, have your pharmacy contact our office and allow 72 hours for refills to be completed.    Today you received the following chemotherapy and/or immunotherapy agents: Trastuzumab (Herceptin), Paclitaxel (Taxol), and Carboplatin       To help prevent nausea and vomiting after your treatment, we encourage you to take your nausea medication as directed.  BELOW ARE SYMPTOMS THAT SHOULD BE REPORTED IMMEDIATELY: *FEVER GREATER THAN 100.4 F (38 C) OR HIGHER *CHILLS OR SWEATING *NAUSEA AND VOMITING THAT IS NOT CONTROLLED WITH YOUR NAUSEA MEDICATION *UNUSUAL SHORTNESS OF BREATH *UNUSUAL BRUISING OR BLEEDING *URINARY PROBLEMS (pain or burning when urinating, or frequent urination) *BOWEL PROBLEMS (unusual diarrhea, constipation, pain near the anus) TENDERNESS IN MOUTH AND THROAT WITH OR WITHOUT PRESENCE OF ULCERS (sore throat, sores in mouth, or a toothache) UNUSUAL RASH, SWELLING OR PAIN  UNUSUAL VAGINAL DISCHARGE OR ITCHING   Items with * indicate a potential emergency and should be followed up as soon as possible or go to the Emergency Department if any problems should occur.  Please show the  CHEMOTHERAPY ALERT CARD or IMMUNOTHERAPY ALERT CARD at check-in to the Emergency Department and triage nurse.  Should you have questions after your visit or need to cancel or reschedule your appointment, please contact [place] [facility]  Dept: [phone]  and follow the prompts.  Office hours are 8:00 a.m. to 4:30 p.m. Monday - Friday. Please note that voicemails left after 4:00 p.m. may not be returned until the following business day.  We are closed weekends and major holidays. You have access to a nurse at all times for urgent questions. Please call the main number to the clinic Dept: [phone] and follow the prompts.   For any non-urgent questions, you may also contact your provider using MyChart. We now offer e-Visits for anyone 64 and older to request care online for non-urgent symptoms. For details visit [email or web address].   Also download the MyChart app! Go to the app store, search "MyChart", open the app, select [place], and log in with your MyChart username and password.

## 2022-11-18 NOTE — Progress Notes (Signed)
Patient provided needed documentation for [employer].  Patient approved for one-time $1000 [employer] grant to assist with personal expenses while going through treatment. She has a copy of the approval letter and expense sheet in green folder. Advised to contact me at earliest convenience to discuss expenses in detail.  She has my card to do so and for any additional financial questions or concerns.

## 2022-11-25 ENCOUNTER — Encounter: Payer: Self-pay | Admitting: Hematology and Oncology

## 2022-12-02 ENCOUNTER — Telehealth: Payer: Self-pay | Admitting: Oncology

## 2022-12-02 NOTE — Telephone Encounter (Signed)
Maria Avila with appointment to see Dr. Pricilla Holm on 12/19/2022 at 8:00 to discuss surgery.  Advised we are planning on a tentative surgery date of 01/07/23.  She verbalized understanding and agreement.

## 2022-12-03 ENCOUNTER — Other Ambulatory Visit: Payer: Self-pay

## 2022-12-08 MED FILL — Fosaprepitant Dimeglumine For IV Infusion 150 MG (Base Eq): INTRAVENOUS | Qty: 5 | Status: AC

## 2022-12-08 MED FILL — Dexamethasone Sodium Phosphate Inj 100 MG/10ML: INTRAMUSCULAR | Qty: 1 | Status: AC

## 2022-12-09 ENCOUNTER — Inpatient Hospital Stay: Payer: Medicare Other

## 2022-12-09 ENCOUNTER — Other Ambulatory Visit: Payer: Self-pay

## 2022-12-09 ENCOUNTER — Inpatient Hospital Stay: Payer: Medicare Other | Admitting: Hematology and Oncology

## 2022-12-09 ENCOUNTER — Encounter: Payer: Self-pay | Admitting: Hematology and Oncology

## 2022-12-09 ENCOUNTER — Encounter: Payer: Self-pay | Admitting: Oncology

## 2022-12-09 ENCOUNTER — Inpatient Hospital Stay: Payer: Medicare Other | Attending: Gynecologic Oncology

## 2022-12-09 VITALS — BP 154/72 | HR 64 | Temp 97.9°F | Resp 18 | Ht 62.0 in | Wt 187.6 lb

## 2022-12-09 VITALS — BP 153/83 | HR 52 | Resp 16

## 2022-12-09 DIAGNOSIS — C775 Secondary and unspecified malignant neoplasm of intrapelvic lymph nodes: Secondary | ICD-10-CM | POA: Diagnosis not present

## 2022-12-09 DIAGNOSIS — D6481 Anemia due to antineoplastic chemotherapy: Secondary | ICD-10-CM

## 2022-12-09 DIAGNOSIS — T451X5A Adverse effect of antineoplastic and immunosuppressive drugs, initial encounter: Secondary | ICD-10-CM | POA: Diagnosis not present

## 2022-12-09 DIAGNOSIS — Z79620 Long term (current) use of immunosuppressive biologic: Secondary | ICD-10-CM | POA: Insufficient documentation

## 2022-12-09 DIAGNOSIS — C55 Malignant neoplasm of uterus, part unspecified: Secondary | ICD-10-CM | POA: Diagnosis present

## 2022-12-09 DIAGNOSIS — Z5112 Encounter for antineoplastic immunotherapy: Secondary | ICD-10-CM | POA: Diagnosis not present

## 2022-12-09 DIAGNOSIS — Z5111 Encounter for antineoplastic chemotherapy: Secondary | ICD-10-CM | POA: Insufficient documentation

## 2022-12-09 DIAGNOSIS — Z86718 Personal history of other venous thrombosis and embolism: Secondary | ICD-10-CM | POA: Diagnosis not present

## 2022-12-09 DIAGNOSIS — C786 Secondary malignant neoplasm of retroperitoneum and peritoneum: Secondary | ICD-10-CM | POA: Diagnosis not present

## 2022-12-09 DIAGNOSIS — R971 Elevated cancer antigen 125 [CA 125]: Secondary | ICD-10-CM | POA: Diagnosis not present

## 2022-12-09 LAB — CBC WITH DIFFERENTIAL (CANCER CENTER ONLY)
Abs Immature Granulocytes: 0.09 10*3/uL — ABNORMAL HIGH (ref 0.00–0.07)
Basophils Absolute: 0 10*3/uL (ref 0.0–0.1)
Basophils Relative: 0 %
Eosinophils Absolute: 0 10*3/uL (ref 0.0–0.5)
Eosinophils Relative: 0 %
HCT: 34.1 % — ABNORMAL LOW (ref 36.0–46.0)
Hemoglobin: 11.9 g/dL — ABNORMAL LOW (ref 12.0–15.0)
Immature Granulocytes: 2 %
Lymphocytes Relative: 31 %
Lymphs Abs: 1.7 10*3/uL (ref 0.7–4.0)
MCH: 32.4 pg (ref 26.0–34.0)
MCHC: 34.9 g/dL (ref 30.0–36.0)
MCV: 92.9 fL (ref 80.0–100.0)
Monocytes Absolute: 0.1 10*3/uL (ref 0.1–1.0)
Monocytes Relative: 1 %
Neutro Abs: 3.6 10*3/uL (ref 1.7–7.7)
Neutrophils Relative %: 66 %
Platelet Count: 174 10*3/uL (ref 150–400)
RBC: 3.67 MIL/uL — ABNORMAL LOW (ref 3.87–5.11)
RDW: 11.8 % (ref 11.5–15.5)
Smear Review: NORMAL
WBC Count: 5.5 10*3/uL (ref 4.0–10.5)
nRBC: 0 % (ref 0.0–0.2)

## 2022-12-09 LAB — CMP (CANCER CENTER ONLY)
ALT: 15 U/L (ref 0–44)
AST: 15 U/L (ref 15–41)
Albumin: 4.4 g/dL (ref 3.5–5.0)
Alkaline Phosphatase: 63 U/L (ref 38–126)
Anion gap: 9 (ref 5–15)
BUN: 19 mg/dL (ref 8–23)
CO2: 24 mmol/L (ref 22–32)
Calcium: 10 mg/dL (ref 8.9–10.3)
Chloride: 106 mmol/L (ref 98–111)
Creatinine: 0.74 mg/dL (ref 0.44–1.00)
GFR, Estimated: >60 mL/min (ref >60)
Glucose, Bld: 155 mg/dL — ABNORMAL HIGH (ref 70–99)
Potassium: 3.4 mmol/L — ABNORMAL LOW (ref 3.5–5.1)
Sodium: 139 mmol/L (ref 135–145)
Total Bilirubin: 0.3 mg/dL (ref 0.3–1.2)
Total Protein: 7.9 g/dL (ref 6.5–8.1)

## 2022-12-09 MED ORDER — CETIRIZINE HCL 10 MG/ML IV SOLN
10.0000 mg | Freq: Once | INTRAVENOUS | Status: AC
  Administered 2022-12-09: 10 mg via INTRAVENOUS
  Filled 2022-12-09: qty 1

## 2022-12-09 MED ORDER — OXYCODONE HCL 5 MG PO TABS: 5.0000 mg | ORAL_TABLET | Freq: Four times a day (QID) | ORAL | 0 refills | Status: DC | PRN

## 2022-12-09 MED ORDER — PALONOSETRON HCL INJECTION 0.25 MG/5ML
0.2500 mg | Freq: Once | INTRAVENOUS | Status: AC
  Administered 2022-12-09: 0.25 mg via INTRAVENOUS
  Filled 2022-12-09: qty 5

## 2022-12-09 MED ORDER — FAMOTIDINE IN NACL 20-0.9 MG/50ML-% IV SOLN
20.0000 mg | Freq: Once | INTRAVENOUS | Status: AC
  Administered 2022-12-09: 20 mg via INTRAVENOUS
  Filled 2022-12-09: qty 50

## 2022-12-09 MED ORDER — TRASTUZUMAB-ANNS CHEMO 150 MG IV SOLR
6.0000 mg/kg | Freq: Once | INTRAVENOUS | Status: AC
  Administered 2022-12-09: 525 mg via INTRAVENOUS
  Filled 2022-12-09: qty 25

## 2022-12-09 MED ORDER — SODIUM CHLORIDE 0.9 % IV SOLN: Freq: Once | INTRAVENOUS | Status: AC

## 2022-12-09 MED ORDER — SODIUM CHLORIDE 0.9 % IV SOLN
482.0000 mg | Freq: Once | INTRAVENOUS | Status: AC
  Administered 2022-12-09: 480 mg via INTRAVENOUS
  Filled 2022-12-09: qty 48

## 2022-12-09 MED ORDER — SODIUM CHLORIDE 0.9% FLUSH
10.0000 mL | INTRAVENOUS | Status: DC | PRN
  Administered 2022-12-09: 10 mL

## 2022-12-09 MED ORDER — SODIUM CHLORIDE 0.9 % IV SOLN
150.0000 mg | Freq: Once | INTRAVENOUS | Status: AC
  Administered 2022-12-09: 150 mg via INTRAVENOUS
  Filled 2022-12-09: qty 150

## 2022-12-09 MED ORDER — SODIUM CHLORIDE 0.9 % IV SOLN
175.0000 mg/m2 | Freq: Once | INTRAVENOUS | Status: AC
  Administered 2022-12-09: 348 mg via INTRAVENOUS
  Filled 2022-12-09: qty 58

## 2022-12-09 MED ORDER — SODIUM CHLORIDE 0.9% FLUSH
10.0000 mL | Freq: Once | INTRAVENOUS | Status: AC
  Administered 2022-12-09: 10 mL

## 2022-12-09 MED ORDER — SODIUM CHLORIDE 0.9 % IV SOLN
10.0000 mg | Freq: Once | INTRAVENOUS | Status: AC
  Administered 2022-12-09: 10 mg via INTRAVENOUS
  Filled 2022-12-09: qty 10

## 2022-12-09 MED ORDER — HEPARIN SOD (PORK) LOCK FLUSH 100 UNIT/ML IV SOLN
500.0000 [IU] | Freq: Once | INTRAVENOUS | Status: AC | PRN
  Administered 2022-12-09: 500 [IU]

## 2022-12-09 MED ORDER — ACETAMINOPHEN 325 MG PO TABS
650.0000 mg | ORAL_TABLET | Freq: Once | ORAL | Status: AC
  Administered 2022-12-09: 650 mg via ORAL
  Filled 2022-12-09: qty 2

## 2022-12-09 NOTE — Progress Notes (Signed)
Notified Maria Avila that her appointment with Dr. Pricilla Holm has been rescheduled to 01/01/2023 at 8:00. Provided her with a printed list of appointment.  She verbalized agreement and understanding of new appointment.

## 2022-12-09 NOTE — Assessment & Plan Note (Signed)

## 2022-12-09 NOTE — Patient Instructions (Signed)
[place] [facility]  Discharge Instructions: Thank you for choosing [facility] to provide your oncology and hematology care.   If you have a lab appointment with the Cancer Center, please go directly to the Cancer Center and check in at the registration area.   Wear comfortable clothing and clothing appropriate for easy access to any Portacath or PICC line.   We strive to give you quality time with your provider. You may need to reschedule your appointment if you arrive late (15 or more minutes).  Arriving late affects you and other patients whose appointments are after yours.  Also, if you miss three or more appointments without notifying the office, you may be dismissed from the clinic at the provider's discretion.      For prescription refill requests, have your pharmacy contact our office and allow 72 hours for refills to be completed.    Today you received the following chemotherapy and/or immunotherapy agents: Kanjinti/Taxol/Carboplatin     To help prevent nausea and vomiting after your treatment, we encourage you to take your nausea medication as directed.  BELOW ARE SYMPTOMS THAT SHOULD BE REPORTED IMMEDIATELY: *FEVER GREATER THAN 100.4 F (38 C) OR HIGHER *CHILLS OR SWEATING *NAUSEA AND VOMITING THAT IS NOT CONTROLLED WITH YOUR NAUSEA MEDICATION *UNUSUAL SHORTNESS OF BREATH *UNUSUAL BRUISING OR BLEEDING *URINARY PROBLEMS (pain or burning when urinating, or frequent urination) *BOWEL PROBLEMS (unusual diarrhea, constipation, pain near the anus) TENDERNESS IN MOUTH AND THROAT WITH OR WITHOUT PRESENCE OF ULCERS (sore throat, sores in mouth, or a toothache) UNUSUAL RASH, SWELLING OR PAIN  UNUSUAL VAGINAL DISCHARGE OR ITCHING   Items with * indicate a potential emergency and should be followed up as soon as possible or go to the Emergency Department if any problems should occur.  Please show the CHEMOTHERAPY ALERT CARD or IMMUNOTHERAPY  ALERT CARD at check-in to the Emergency Department and triage nurse.  Should you have questions after your visit or need to cancel or reschedule your appointment, please contact [place] [facility]  Dept: [phone]  and follow the prompts.  Office hours are 8:00 a.m. to 4:30 p.m. Monday - Friday. Please note that voicemails left after 4:00 p.m. may not be returned until the following business day.  We are closed weekends and major holidays. You have access to a nurse at all times for urgent questions. Please call the main number to the clinic Dept: [phone] and follow the prompts.   For any non-urgent questions, you may also contact your provider using MyChart. We now offer e-Visits for anyone 68 and older to request care online for non-urgent symptoms. For details visit [email or web address].   Also download the MyChart app! Go to the app store, search "MyChart", open the app, select [place], and log in with your MyChart username and password.

## 2022-12-09 NOTE — Assessment & Plan Note (Signed)
Overall, she tolerated treatment very well except for arthralgia for few days after treatment that is well-controlled with oxycodone I refilled her prescription today I plan to order imaging study after cycle 3 She will see GYN surgeon at the end of the month for assessment to see if she would be a candidate for surgery in the near future

## 2022-12-09 NOTE — Progress Notes (Signed)
[facility] OFFICE PROGRESS NOTE  Patient Care Team: Merri Brunette, MD as PCP - General (Internal Medicine)  ASSESSMENT & PLAN:  Uterine cancer (HCC) Overall, she tolerated treatment very well except for arthralgia for few days after treatment that is well-controlled with oxycodone I refilled her prescription today I plan to order imaging study after cycle 3 She will see GYN surgeon at the end of the month for assessment to see if she would be a candidate for surgery in the near future  Anemia due to antineoplastic chemotherapy This is likely due to recent treatment. The patient denies recent history of bleeding such as epistaxis, hematuria or hematochezia. She is asymptomatic from the anemia. I will observe for now.  She does not require transfusion now. I will continue the chemotherapy at current dose without dosage adjustment.  If the anemia gets progressive worse in the future, I might have to delay her treatment or adjust the chemotherapy dose.   Orders Placed This Encounter  Procedures   CT ABDOMEN PELVIS W CONTRAST    Standing Status:   Future    Standing Expiration Date:   12/09/2023    Scheduling Instructions:     No need to drink oral contrast    Order Specific Question:   If indicated for the ordered procedure, I authorize the administration of contrast media per Radiology protocol    Answer:   Yes    Order Specific Question:   Does the patient have a contrast media/X-ray dye allergy?    Answer:   No    Order Specific Question:   Preferred imaging location?    Answer:   [facility]    Order Specific Question:   If indicated for the ordered procedure, I authorize the administration of oral contrast media per Radiology protocol    Answer:   Yes    All questions were answered. The patient knows to call the clinic with any problems, questions or concerns. The total time spent in the appointment was 30 minutes encounter with patients including review of  chart and various tests results, discussions about plan of care and coordination of care plan   Artis Delay, MD 12/09/2022 10:28 AM  INTERVAL HISTORY: Please see below for problem oriented charting. she returns for treatment follow-up seen prior to cycle 3 of treatment She is doing well She denies side effects from treatment No peripheral neuropathy She has mild sporadic vaginal spotting  REVIEW OF SYSTEMS:   Constitutional: Denies fevers, chills or abnormal weight loss Eyes: Denies blurriness of vision Ears, nose, mouth, throat, and face: Denies mucositis or sore throat Respiratory: Denies cough, dyspnea or wheezes Cardiovascular: Denies palpitation, chest discomfort or lower extremity swelling Gastrointestinal:  Denies nausea, heartburn or change in bowel habits Skin: Denies abnormal skin rashes Lymphatics: Denies new lymphadenopathy or easy bruising Neurological:Denies numbness, tingling or new weaknesses Behavioral/Psych: Mood is stable, no new changes  All other systems were reviewed with the patient and are negative.  I have reviewed the past medical history, past surgical history, social history and family history with the patient and they are unchanged from previous note.  ALLERGIES:  is allergic to crestor [rosuvastatin calcium] and zocor [simvastatin].  MEDICATIONS:  Current Outpatient Medications  Medication Sig Dispense Refill   acetaminophen (TYLENOL) 325 MG tablet Take 1-2 tablets (325-650 mg total) by mouth every 4 (four) hours as needed for mild pain.     aspirin EC 81 MG tablet Take 81 mg by mouth daily.  OTC     benazepril-hydrochlorthiazide (LOTENSIN HCT) 20-25 MG tablet Take 1 tablet by mouth daily.     [employer] VITAMIN C 500 MG tablet TAKE 1 TABLET BY MOUTH TWICE A DAY 100 tablet 0   dexamethasone (DECADRON) 4 MG tablet Take 2 tabs at the night before and 2 tab the morning of chemotherapy, every 3 weeks, by mouth x 6 cycles 36 tablet 6   ezetimibe (ZETIA) 10 MG  tablet Take 10 mg by mouth daily.     hydrochlorothiazide (HYDRODIURIL) 12.5 MG tablet Take 12.5 mg by mouth daily.     lidocaine-prilocaine (EMLA) cream Apply to affected area once 30 g 3   Multiple Vitamin (MULTIVITAMIN) capsule Take 1 capsule by mouth daily.     ondansetron (ZOFRAN) 8 MG tablet Take 1 tablet (8 mg total) by mouth every 8 (eight) hours as needed for nausea. 30 tablet 3   oxyCODONE (OXY IR/ROXICODONE) 5 MG immediate release tablet Take 1 tablet (5 mg total) by mouth every 6 (six) hours as needed for severe pain. 30 tablet 0   prochlorperazine (COMPAZINE) 10 MG tablet Take 1 tablet (10 mg total) by mouth every 6 (six) hours as needed for nausea or vomiting. 30 tablet 1   thiamine (VITAMIN B-1) 100 MG tablet TAKE 1 TABLET BY MOUTH EVERY DAY 30 tablet 0   No current facility-administered medications for this visit.   Facility-Administered Medications Ordered in Other Visits  Medication Dose Route Frequency Provider Last Rate Last Admin   CARBOplatin (PARAPLATIN) 480 mg in sodium chloride 0.9 % 250 mL chemo infusion  480 mg Intravenous Once Bertis Ruddy, , MD       dexamethasone (DECADRON) 10 mg in sodium chloride 0.9 % 50 mL IVPB  10 mg Intravenous Once Bertis Ruddy, , MD 204 mL/hr at 12/09/22 1019 10 mg at 12/09/22 1019   famotidine (PEPCID) IVPB 20 mg premix  20 mg Intravenous Once Bertis Ruddy, , MD       fosaprepitant (EMEND) 150 mg in sodium chloride 0.9 % 145 mL IVPB  150 mg Intravenous Once Bertis Ruddy, , MD 450 mL/hr at 12/09/22 1018 150 mg at 12/09/22 1018   heparin lock flush 100 unit/mL  500 Units Intracatheter Once PRN Artis Delay, MD       PACLitaxel (TAXOL) 348 mg in sodium chloride 0.9 % 500 mL chemo infusion (> 80mg /m2)  175 mg/m2 (Treatment Plan Recorded) Intravenous Once Bertis Ruddy, , MD       sodium chloride flush (NS) 0.9 % injection 10 mL  10 mL Intracatheter PRN Bertis Ruddy, , MD       trastuzumab-anns (KANJINTI) 525 mg in sodium chloride 0.9 % 250 mL chemo infusion  6  mg/kg (Treatment Plan Recorded) Intravenous Once Artis Delay, MD        SUMMARY OF ONCOLOGIC HISTORY: Oncology History Overview Note  MMR normal, Her2 positive   Uterine cancer (HCC)  08/12/2022 Initial Diagnosis   She presented with PMB   09/24/2022 Pathology Results   Diagnosis 1. Endometrium, curettage - HIGH GRADE SEROUS CARCINOMA (SEE NOTE) 2. Endometrium, resection - HIGH GRADE SEROUS CARCINOMA (SEE NOTE) Diagnosis Note 1. - immunohistochemical stain reveal tumor cells are positive for p16. P53 shows strong expression consistent with mutant type. This case was reviewed with Dr. Reynolds Bowl who agrees with the diagnosis   10/14/2022 Imaging   1. Ill-defined fullness of the uterus and endometrial cavity without discretely visualized mass, in keeping with patient's known endometrial malignancy. 2. Enlarged right iliac and pelvic sidewall lymph  nodes, as well as enlarged epicardial lymph nodes or soft tissue nodules. 3. Scattered peritoneal and omental soft tissue nodularity, particularly notable in the left upper quadrant. 4. Soft tissue matting about the distal sigmoid colon and rectum in the low pelvis. 5. Constellation of findings is consistent with nodal, peritoneal, and omental metastatic disease. 6. Small right pleural effusion, nonspecific although modestly suspicious for a malignant effusion, however without directly visualized pleural mass or nodularity. 7. Pancolonic diverticulosis, severe in the descending and sigmoid colon. 8. Cholelithiasis. 9. Coronary artery disease.   10/17/2022 Initial Diagnosis   Uterine cancer (HCC)   10/17/2022 Cancer Staging   Staging form: Corpus Uteri - Carcinoma and Carcinosarcoma, AJCC 8th Edition - Clinical stage from 10/17/2022: FIGO Stage IVB (cT1b, cN1, cM1) - Signed by Artis Delay, MD on 10/17/2022 Stage prefix: Initial diagnosis   10/23/2022 Echocardiogram   1. Left ventricular ejection fraction, by estimation, is 65 to 70%. Left ventricular  ejection fraction by 2D MOD biplane is 68.1 %. The left ventricle has normal function. The left ventricle has no regional wall motion abnormalities. There is mild left ventricular hypertrophy. Left ventricular diastolic parameters are consistent with Grade I diastolic dysfunction (impaired relaxation).  2. Right ventricular systolic function is normal. The right ventricular size is normal. There is normal pulmonary artery systolic pressure. The estimated right ventricular systolic pressure is 27.6 mmHg.  3. The mitral valve is abnormal. Trivial mitral valve regurgitation.  4. The aortic valve is tricuspid. Aortic valve regurgitation is not visualized. Aortic valve sclerosis is present, with no evidence of aortic valve stenosis.  5. The inferior vena cava is normal in size with greater than 50% respiratory variability, suggesting right atrial pressure of 3 mmHg.   10/28/2022 -  Chemotherapy   Patient is on Treatment Plan : UTERINE SEROUS CARCINOMA Carboplatin + Paclitaxel + Trastuzumab q21d x 6 Cycles / Trastuzumab q21d       PHYSICAL EXAMINATION: ECOG PERFORMANCE STATUS: 1 - Symptomatic but completely ambulatory  Vitals:   12/09/22 0902  BP: (!) 154/72  Pulse: 64  Resp: 18  Temp: 97.9 F (36.6 C)  SpO2: 97%   Filed Weights   12/09/22 0902  Weight: 187 lb 9.6 oz (85.1 kg)    GENERAL:alert, no distress and comfortable NEURO: alert & oriented x 3 with fluent speech, no focal motor/sensory deficits  LABORATORY DATA:  I have reviewed the data as listed    Component Value Date/Time   NA 139 12/09/2022 0846   NA 144 05/13/2018 1500   K 3.4 (L) 12/09/2022 0846   CL 106 12/09/2022 0846   CO2 24 12/09/2022 0846   GLUCOSE 155 (H) 12/09/2022 0846   BUN 19 12/09/2022 0846   BUN 13 05/13/2018 1500   CREATININE 0.74 12/09/2022 0846   CALCIUM 10.0 12/09/2022 0846   PROT 7.9 12/09/2022 0846   ALBUMIN 4.4 12/09/2022 0846   AST 15 12/09/2022 0846   ALT 15 12/09/2022 0846   ALKPHOS 63  12/09/2022 0846   BILITOT 0.3 12/09/2022 0846   GFRNONAA >60 12/09/2022 0846   GFRAA 110 05/13/2018 1500   GFRAA >60 07/21/2017 1131    No results found for: "SPEP", "UPEP"  Lab Results  Component Value Date   WBC 5.5 12/09/2022   NEUTROABS 3.6 12/09/2022   HGB 11.9 (L) 12/09/2022   HCT 34.1 (L) 12/09/2022   MCV 92.9 12/09/2022   PLT 174 12/09/2022      Chemistry      Component Value Date/Time  NA 139 12/09/2022 0846   NA 144 05/13/2018 1500   K 3.4 (L) 12/09/2022 0846   CL 106 12/09/2022 0846   CO2 24 12/09/2022 0846   BUN 19 12/09/2022 0846   BUN 13 05/13/2018 1500   CREATININE 0.74 12/09/2022 0846      Component Value Date/Time   CALCIUM 10.0 12/09/2022 0846   ALKPHOS 63 12/09/2022 0846   AST 15 12/09/2022 0846   ALT 15 12/09/2022 0846   BILITOT 0.3 12/09/2022 0846

## 2022-12-10 ENCOUNTER — Other Ambulatory Visit: Payer: Self-pay

## 2022-12-10 ENCOUNTER — Telehealth: Payer: Self-pay | Admitting: Oncology

## 2022-12-10 ENCOUNTER — Other Ambulatory Visit: Payer: Self-pay | Admitting: Gynecologic Oncology

## 2022-12-10 DIAGNOSIS — C800 Disseminated malignant neoplasm, unspecified: Secondary | ICD-10-CM

## 2022-12-10 DIAGNOSIS — C549 Malignant neoplasm of corpus uteri, unspecified: Secondary | ICD-10-CM

## 2022-12-10 NOTE — Telephone Encounter (Signed)
[facility] and let her know that we are tentatively holding 01/07/2023 as a surgery date.  She verbalized understanding and agreement.

## 2022-12-11 ENCOUNTER — Telehealth: Payer: Self-pay | Admitting: Oncology

## 2022-12-11 NOTE — Telephone Encounter (Signed)
[facility] and scheduled a port flush/lab appointment on 12/19/2022 at 8:30 to draw a CA 125 before her possible surgery with Dr. Pricilla Holm.

## 2022-12-14 ENCOUNTER — Other Ambulatory Visit: Payer: Self-pay

## 2022-12-17 ENCOUNTER — Ambulatory Visit (HOSPITAL_COMMUNITY)
Admission: RE | Admit: 2022-12-17 | Discharge: 2022-12-17 | Disposition: A | Discharge status: Home / Self Care | Payer: Medicare Other | Source: Ambulatory Visit | Attending: Hematology and Oncology | Admitting: Hematology and Oncology

## 2022-12-17 DIAGNOSIS — C55 Malignant neoplasm of uterus, part unspecified: Secondary | ICD-10-CM | POA: Diagnosis present

## 2022-12-17 DIAGNOSIS — K5730 Diverticulosis of large intestine without perforation or abscess without bleeding: Secondary | ICD-10-CM | POA: Diagnosis not present

## 2022-12-17 DIAGNOSIS — R9389 Abnormal findings on diagnostic imaging of other specified body structures: Secondary | ICD-10-CM | POA: Diagnosis not present

## 2022-12-17 DIAGNOSIS — C779 Secondary and unspecified malignant neoplasm of lymph node, unspecified: Secondary | ICD-10-CM | POA: Diagnosis not present

## 2022-12-17 MED ORDER — IOHEXOL 350 MG/ML SOLN
75.0000 mL | Freq: Once | INTRAVENOUS | Status: AC | PRN
  Administered 2022-12-17: 75 mL via INTRAVENOUS

## 2022-12-19 ENCOUNTER — Encounter: Payer: Medicare Other | Admitting: Gynecologic Oncology

## 2022-12-19 ENCOUNTER — Other Ambulatory Visit: Payer: Self-pay

## 2022-12-19 ENCOUNTER — Inpatient Hospital Stay: Payer: Medicare Other

## 2022-12-19 DIAGNOSIS — C549 Malignant neoplasm of corpus uteri, unspecified: Secondary | ICD-10-CM

## 2022-12-19 DIAGNOSIS — Z5111 Encounter for antineoplastic chemotherapy: Secondary | ICD-10-CM | POA: Diagnosis not present

## 2022-12-19 DIAGNOSIS — Z86718 Personal history of other venous thrombosis and embolism: Secondary | ICD-10-CM | POA: Diagnosis not present

## 2022-12-19 DIAGNOSIS — C786 Secondary malignant neoplasm of retroperitoneum and peritoneum: Secondary | ICD-10-CM | POA: Diagnosis not present

## 2022-12-19 DIAGNOSIS — C800 Disseminated malignant neoplasm, unspecified: Secondary | ICD-10-CM

## 2022-12-19 DIAGNOSIS — Z5112 Encounter for antineoplastic immunotherapy: Secondary | ICD-10-CM | POA: Diagnosis not present

## 2022-12-19 DIAGNOSIS — D6481 Anemia due to antineoplastic chemotherapy: Secondary | ICD-10-CM | POA: Diagnosis not present

## 2022-12-19 DIAGNOSIS — C775 Secondary and unspecified malignant neoplasm of intrapelvic lymph nodes: Secondary | ICD-10-CM | POA: Diagnosis not present

## 2022-12-19 DIAGNOSIS — C55 Malignant neoplasm of uterus, part unspecified: Secondary | ICD-10-CM

## 2022-12-19 DIAGNOSIS — R971 Elevated cancer antigen 125 [CA 125]: Secondary | ICD-10-CM | POA: Diagnosis not present

## 2022-12-19 MED ORDER — SODIUM CHLORIDE 0.9% FLUSH
10.0000 mL | Freq: Once | INTRAVENOUS | Status: AC
  Administered 2022-12-19: 10 mL

## 2022-12-19 MED ORDER — HEPARIN SOD (PORK) LOCK FLUSH 100 UNIT/ML IV SOLN
500.0000 [IU] | Freq: Once | INTRAVENOUS | Status: AC
  Administered 2022-12-19: 500 [IU]

## 2022-12-20 LAB — CA 125: Cancer Antigen (CA) 125: 211 U/mL — ABNORMAL HIGH (ref 0.0–38.1)

## 2022-12-24 ENCOUNTER — Other Ambulatory Visit: Payer: Self-pay | Admitting: Hematology and Oncology

## 2022-12-24 ENCOUNTER — Telehealth: Payer: Self-pay | Admitting: Oncology

## 2022-12-24 NOTE — Telephone Encounter (Signed)
[facility] and advised that Dr. Pricilla Holm has reviewed her CT scan results and would like to proceed with surgery on 01/07/23.  Discussed that we will cancel her chemo and lab/port flush appointments on 8/27 and asked if she would still like to see Dr. Bertis Ruddy if she has any questions or if she is ok seeing Dr. Pricilla Holm on 8/29.  Maria Avila said she is ok with canceling all the appointments on 12/30/22 and does not have any questions.

## 2022-12-25 NOTE — Patient Instructions (Signed)
SURGICAL WAITING ROOM VISITATION  Patients having surgery or a procedure may have no more than 2 support people in the waiting area - these visitors may rotate.    Children under the age of 75 must have an adult with them who is not the patient.  Due to an increase in RSV and influenza rates and associated hospitalizations, children ages 59 and under may not visit patients in [facility] hospitals.  If the patient needs to stay at the hospital during part of their recovery, the visitor guidelines for inpatient rooms apply. Pre-op nurse will coordinate an appropriate time for 1 support person to accompany patient in pre-op.  This support person may not rotate.    Please refer to the [facility] website for the visitor guidelines for Inpatients (after your surgery is over and you are in a regular room).       Your procedure is scheduled on: 01/07/23   Report to [facility] Main Entrance    Report to admitting at  6:15 AM   Call this number if you have problems the morning of surgery [phone]   Eat a light diet the day before surgery. Avoid gas causing foods and drinks.   Do not eat food :After Midnight.   After Midnight you may have the following liquids until _5:30 AM DAY OF SURGERY  Water Non-Citrus Juices (without pulp, NO RED-Apple, White grape, White cranberry) Black Coffee (NO MILK/CREAM OR CREAMERS, sugar ok)  Clear Tea (NO MILK/CREAM OR CREAMERS, sugar ok) regular and decaf                             Plain Jell-O (NO RED)                                           Fruit ices (not with fruit pulp, NO RED)                                     Popsicles (NO RED)                                                               Sports drinks like Gatorade (NO RED)                 FOLLOW BOWEL PREP AND ANY ADDITIONAL PRE OP INSTRUCTIONS YOU RECEIVED FROM YOUR SURGEON'S OFFICE!!!     Oral Hygiene is also important to reduce your risk of infection.                                     Remember - BRUSH YOUR TEETH THE MORNING OF SURGERY WITH YOUR REGULAR TOOTHPASTE  DENTURES WILL BE REMOVED PRIOR TO SURGERY PLEASE DO NOT APPLY "Poly grip" OR ADHESIVES!!!   Stop all vitamins and herbal supplements 7 days before surgery.   Take these medicines the morning of surgery: Ezetimibe, Oxycodone or tylenol if needed  You may not have any metal on your body including hair pins, jewelry, and body piercing             Do not wear make-up, lotions, powders, perfumes/cologne, or deodorant  Do not wear nail polish including gel and S&S, artificial/acrylic nails, or any other type of covering on natural nails including finger and toenails. If you have artificial nails, gel coating, etc. that needs to be removed by a nail salon please have this removed prior to surgery or surgery may need to be canceled/ delayed if the surgeon/ anesthesia feels like they are unable to be safely monitored.   Do not shave  48 hours prior to surgery.    Do not bring valuables to the hospital. [facility] IS NOT             RESPONSIBLE   FOR VALUABLES.   Contacts, glasses, dentures or bridgework may not be worn into surgery.   Bring small overnight bag day of surgery.   DO NOT BRING YOUR HOME MEDICATIONS TO THE HOSPITAL. PHARMACY WILL DISPENSE MEDICATIONS LISTED ON YOUR MEDICATION LIST TO YOU DURING YOUR ADMISSION IN THE HOSPITAL!    Patients discharged on the day of surgery will not be allowed to drive home.  Someone NEEDS to stay with you for the first 24 hours after anesthesia.   Special Instructions: Bring a copy of your healthcare power of attorney and living will documents the day of surgery if you haven't scanned them before.              Please read over the following fact sheets you were given: IF YOU HAVE QUESTIONS ABOUT YOUR PRE-OP INSTRUCTIONS PLEASE CALL [phone] Rosey [facility]   If you received a COVID test during your pre-op visit  it is requested that you wear a mask  when out in public, stay away from anyone that may not be feeling well and notify your surgeon if you develop symptoms. If you test positive for Covid or have been in contact with anyone that has tested positive in the last 10 days please notify you surgeon.    [facility] - Preparing for Surgery Before surgery, you can play an important role.  Because skin is not sterile, your skin needs to be as free of germs as possible.  You can reduce the number of germs on your skin by washing with CHG (chlorahexidine gluconate) soap before surgery.  CHG is an antiseptic cleaner which kills germs and bonds with the skin to continue killing germs even after washing. Please DO NOT use if you have an allergy to CHG or antibacterial soaps.  If your skin becomes reddened/irritated stop using the CHG and inform your nurse when you arrive at Short Stay. Do not shave (including legs and underarms) for at least 48 hours prior to the first CHG shower.  You may shave your face/neck.  Please follow these instructions carefully:  1.  Shower with CHG Soap the night before surgery and the  morning of surgery.  2.  If you choose to wash your hair, wash your hair first as usual with your normal  shampoo.  3.  After you shampoo, rinse your hair and body thoroughly to remove the shampoo.                             4.  Use CHG as you would any other liquid soap.  You can apply chg directly  to the skin and wash.  Gently with a scrungie or clean washcloth.  5.  Apply the CHG Soap to your body ONLY FROM THE NECK DOWN.   Do   not use on face/ open                           Wound or open sores. Avoid contact with eyes, ears mouth and   genitals (private parts).                       Wash face,  Genitals (private parts) with your normal soap.             6.  Wash thoroughly, paying special attention to the area where your    surgery  will be performed.  7.  Thoroughly rinse your body with warm water from the neck down.  8.  DO NOT  shower/wash with your normal soap after using and rinsing off the CHG Soap.                9.  Pat yourself dry with a clean towel.            10.  Wear clean pajamas.            11.  Place clean sheets on your bed the night of your first shower and do not  sleep with pets. Day of Surgery : Do not apply any lotions/deodorants the morning of surgery.  Please wear clean clothes to the hospital/surgery center.  FAILURE TO FOLLOW THESE INSTRUCTIONS MAY RESULT IN THE CANCELLATION OF YOUR SURGERY  PATIENT SIGNATURE_________________________________  NURSE SIGNATURE__________________________________    ________________________________________________________________________ WHAT IS A BLOOD TRANSFUSION? Blood Transfusion Information  A transfusion is the replacement of blood or some of its parts. Blood is made up of multiple cells which provide different functions. Red blood cells carry oxygen and are used for blood loss replacement. White blood cells fight against infection. Platelets control bleeding. Plasma helps clot blood. Other blood products are available for specialized needs, such as hemophilia or other clotting disorders. BEFORE THE TRANSFUSION  Who gives blood for transfusions?  Healthy volunteers who are fully evaluated to make sure their blood is safe. This is blood bank blood. Transfusion therapy is the safest it has ever been in the practice of medicine. Before blood is taken from a donor, a complete history is taken to make sure that person has no history of diseases nor engages in risky social behavior (examples are intravenous drug use or sexual activity with multiple partners). The donor's travel history is screened to minimize risk of transmitting infections, such as malaria. The donated blood is tested for signs of infectious diseases, such as HIV and hepatitis. The blood is then tested to be sure it is compatible with you in order to minimize the chance of a transfusion  reaction. If you or a relative donates blood, this is often done in anticipation of surgery and is not appropriate for emergency situations. It takes many days to process the donated blood. RISKS AND COMPLICATIONS Although transfusion therapy is very safe and saves many lives, the main dangers of transfusion include:  Getting an infectious disease. Developing a transfusion reaction. This is an allergic reaction to something in the blood you were given. Every precaution is taken to prevent this. The decision to have a blood transfusion has been considered carefully by your caregiver before blood is given. Blood  is not given unless the benefits outweigh the risks. AFTER THE TRANSFUSION Right after receiving a blood transfusion, you will usually feel much better and more energetic. This is especially true if your red blood cells have gotten low (anemic). The transfusion raises the level of the red blood cells which carry oxygen, and this usually causes an energy increase. The nurse administering the transfusion will monitor you carefully for complications. HOME CARE INSTRUCTIONS  No special instructions are needed after a transfusion. You may find your energy is better. Speak with your caregiver about any limitations on activity for underlying diseases you may have. SEEK MEDICAL CARE IF:  Your condition is not improving after your transfusion. You develop redness or irritation at the intravenous (IV) site. SEEK IMMEDIATE MEDICAL CARE IF:  Any of the following symptoms occur over the next 12 hours: Shaking chills. You have a temperature by mouth above 102 F (38.9 C), not controlled by medicine. Chest, back, or muscle pain. People around you feel you are not acting correctly or are confused. Shortness of breath or difficulty breathing. Dizziness and fainting. You get a rash or develop hives. You have a decrease in urine output. Your urine turns a dark color or changes to pink, red, or  brown. Any of the following symptoms occur over the next 10 days: You have a temperature by mouth above 102 F (38.9 C), not controlled by medicine. Shortness of breath. Weakness after normal activity. The white part of the eye turns yellow (jaundice). You have a decrease in the amount of urine or are urinating less often. Your urine turns a dark color or changes to pink, red, or brown. Document Released: 04/18/2000 Document Revised: 07/14/2011 Document Reviewed: 12/06/2007 [facility] Patient Information 2014 [place], [place].

## 2022-12-25 NOTE — Progress Notes (Addendum)
COVID Vaccine received:  []  No [x]  Yes Date of any COVID positive Test in last 90 days: No PCP - Merri Brunette MD Cardiologist -   Chest x-ray - Chest CT 10/14/22 Epic EKG -   Stress Test -  ECHO - 10/23/22 Epic Cardiac Cath -   Bowel Prep - [x]  No  []   Yes ______  Pacemaker / ICD device [x]  No []  Yes   Spinal Cord Stimulator:[x]  No []  Yes       History of Sleep Apnea? [x]  No []  Yes   CPAP used?- [x]  No []  Yes    Does the patient monitor blood sugar?          [x]  No []  Yes  []  N/A  Patient has: [x]  NO Hx DM   []  Pre-DM                 []  DM1  []   DM2 Does patient have a [employer] or Dexacom? []  No []  Yes   Fasting Blood Sugar Ranges-  Checks Blood Sugar _____ times a day  GLP1 agonist / usual dose - No GLP1 instructions:  SGLT-2 inhibitors / usual dose - No SGLT-2 instructions:   Blood Thinner / Instructions:No Aspirin Instructions:81 mg. Instructed pt. To call MD office that prescribed ASA to see when and if they want her to stop it prior to surgery. She verbalized understanding.  Comments:   Activity level: Patient is able to climb a flight of stairs without difficulty; [x]  No CP  [x]  No SOB,  _   Patient can perform ADLs without assistance.   Anesthesia review: HTN, DVT, Stroke 2019, Anemia, CAD  Patient denies shortness of breath, fever, cough and chest pain at PAT appointment.  Patient verbalized understanding and agreement to the Pre-Surgical Instructions that were given to them at this PAT appointment. Patient was also educated of the need to review these PAT instructions again prior to his/her surgery.I reviewed the appropriate phone numbers to call if they have any and questions or concerns.

## 2022-12-26 ENCOUNTER — Encounter (HOSPITAL_COMMUNITY)
Admission: RE | Admit: 2022-12-26 | Discharge: 2022-12-26 | Disposition: A | Discharge status: Home / Self Care | Payer: Medicare Other | Source: Ambulatory Visit | Attending: Gynecologic Oncology | Admitting: Gynecologic Oncology

## 2022-12-26 ENCOUNTER — Other Ambulatory Visit: Payer: Self-pay

## 2022-12-26 VITALS — BP 148/80 | HR 54 | Temp 98.4°F | Resp 16 | Ht 62.0 in | Wt 187.0 lb

## 2022-12-26 DIAGNOSIS — Z01812 Encounter for preprocedural laboratory examination: Secondary | ICD-10-CM | POA: Insufficient documentation

## 2022-12-26 DIAGNOSIS — C55 Malignant neoplasm of uterus, part unspecified: Secondary | ICD-10-CM | POA: Insufficient documentation

## 2022-12-26 DIAGNOSIS — Z01818 Encounter for other preprocedural examination: Secondary | ICD-10-CM | POA: Diagnosis present

## 2022-12-26 DIAGNOSIS — I10 Essential (primary) hypertension: Secondary | ICD-10-CM | POA: Diagnosis not present

## 2022-12-26 LAB — CBC WITH DIFFERENTIAL/PLATELET
Abs Immature Granulocytes: 0.01 10*3/uL (ref 0.00–0.07)
Basophils Absolute: 0 10*3/uL (ref 0.0–0.1)
Basophils Relative: 0 %
Eosinophils Absolute: 0 10*3/uL (ref 0.0–0.5)
Eosinophils Relative: 1 %
HCT: 32.2 % — ABNORMAL LOW (ref 36.0–46.0)
Hemoglobin: 10.9 g/dL — ABNORMAL LOW (ref 12.0–15.0)
Immature Granulocytes: 0 %
Lymphocytes Relative: 68 %
Lymphs Abs: 2.1 10*3/uL (ref 0.7–4.0)
MCH: 33.2 pg (ref 26.0–34.0)
MCHC: 33.9 g/dL (ref 30.0–36.0)
MCV: 98.2 fL (ref 80.0–100.0)
Monocytes Absolute: 0.3 10*3/uL (ref 0.1–1.0)
Monocytes Relative: 11 %
Neutro Abs: 0.6 10*3/uL — ABNORMAL LOW (ref 1.7–7.7)
Neutrophils Relative %: 20 %
Platelets: 156 10*3/uL (ref 150–400)
RBC: 3.28 MIL/uL — ABNORMAL LOW (ref 3.87–5.11)
RDW: 13.1 % (ref 11.5–15.5)
WBC: 3.1 10*3/uL — ABNORMAL LOW (ref 4.0–10.5)
nRBC: 0 % (ref 0.0–0.2)

## 2022-12-26 LAB — COMPREHENSIVE METABOLIC PANEL
ALT: 16 U/L (ref 0–44)
AST: 17 U/L (ref 15–41)
Albumin: 4 g/dL (ref 3.5–5.0)
Alkaline Phosphatase: 59 U/L (ref 38–126)
Anion gap: 8 (ref 5–15)
BUN: 15 mg/dL (ref 8–23)
CO2: 25 mmol/L (ref 22–32)
Calcium: 9.6 mg/dL (ref 8.9–10.3)
Chloride: 104 mmol/L (ref 98–111)
Creatinine, Ser: 0.71 mg/dL (ref 0.44–1.00)
GFR, Estimated: >60 mL/min (ref >60)
Glucose, Bld: 92 mg/dL (ref 70–99)
Potassium: 3.8 mmol/L (ref 3.5–5.1)
Sodium: 137 mmol/L (ref 135–145)
Total Bilirubin: 0.5 mg/dL (ref 0.3–1.2)
Total Protein: 7.4 g/dL (ref 6.5–8.1)

## 2022-12-26 LAB — TYPE AND SCREEN
ABO/RH(D): O POS
Antibody Screen: NEGATIVE

## 2022-12-29 ENCOUNTER — Encounter (HOSPITAL_COMMUNITY): Payer: Self-pay

## 2022-12-30 ENCOUNTER — Other Ambulatory Visit: Payer: Medicare Other

## 2022-12-30 ENCOUNTER — Other Ambulatory Visit: Payer: Self-pay | Admitting: Hematology and Oncology

## 2022-12-30 ENCOUNTER — Ambulatory Visit: Payer: Medicare Other

## 2022-12-30 ENCOUNTER — Encounter: Payer: Self-pay | Admitting: Hematology and Oncology

## 2022-12-30 ENCOUNTER — Ambulatory Visit: Payer: Medicare Other | Admitting: Hematology and Oncology

## 2022-12-31 NOTE — H&P (View-Only) (Signed)
Gynecologic Oncology Return Clinic Visit  01/01/23  Reason for Visit: treatment planning  Treatment History: Oncology History Overview Note  MMR normal, Her2 positive   Uterine cancer (HCC)  08/12/2022 Initial Diagnosis   She presented with PMB   09/24/2022 Pathology Results   Diagnosis 1. Endometrium, curettage - HIGH GRADE SEROUS CARCINOMA (SEE NOTE) 2. Endometrium, resection - HIGH GRADE SEROUS CARCINOMA (SEE NOTE) Diagnosis Note 1. - immunohistochemical stain reveal tumor cells are positive for p16. P53 shows strong expression consistent with mutant type. This case was reviewed with Dr. Reynolds Bowl who agrees with the diagnosis   10/14/2022 Imaging   1. Ill-defined fullness of the uterus and endometrial cavity without discretely visualized mass, in keeping with patient's known endometrial malignancy. 2. Enlarged right iliac and pelvic sidewall lymph nodes, as well as enlarged epicardial lymph nodes or soft tissue nodules. 3. Scattered peritoneal and omental soft tissue nodularity, particularly notable in the left upper quadrant. 4. Soft tissue matting about the distal sigmoid colon and rectum in the low pelvis. 5. Constellation of findings is consistent with nodal, peritoneal, and omental metastatic disease. 6. Small right pleural effusion, nonspecific although modestly suspicious for a malignant effusion, however without directly visualized pleural mass or nodularity. 7. Pancolonic diverticulosis, severe in the descending and sigmoid colon. 8. Cholelithiasis. 9. Coronary artery disease.   10/17/2022 Initial Diagnosis   Uterine cancer (HCC)   10/17/2022 Cancer Staging   Staging form: Corpus Uteri - Carcinoma and Carcinosarcoma, AJCC 8th Edition - Clinical stage from 10/17/2022: FIGO Stage IVB (cT1b, cN1, cM1) - Signed by Artis Delay, MD on 10/17/2022 Stage prefix: Initial diagnosis   10/23/2022 Echocardiogram   1. Left ventricular ejection fraction, by estimation, is 65 to 70%. Left  ventricular ejection fraction by 2D MOD biplane is 68.1 %. The left ventricle has normal function. The left ventricle has no regional wall motion abnormalities. There is mild left ventricular hypertrophy. Left ventricular diastolic parameters are consistent with Grade I diastolic dysfunction (impaired relaxation).  2. Right ventricular systolic function is normal. The right ventricular size is normal. There is normal pulmonary artery systolic pressure. The estimated right ventricular systolic pressure is 27.6 mmHg.  3. The mitral valve is abnormal. Trivial mitral valve regurgitation.  4. The aortic valve is tricuspid. Aortic valve regurgitation is not visualized. Aortic valve sclerosis is present, with no evidence of aortic valve stenosis.  5. The inferior vena cava is normal in size with greater than 50% respiratory variability, suggesting right atrial pressure of 3 mmHg.   10/28/2022 -  Chemotherapy   Patient is on Treatment Plan : UTERINE SEROUS CARCINOMA Carboplatin + Paclitaxel + Trastuzumab q21d x 6 Cycles / Trastuzumab q21d     12/23/2022 Imaging   CT ABDOMEN PELVIS W CONTRAST  Result Date: 12/23/2022 CLINICAL DATA:  Follow-up endometrial cancer, status post chemotherapy EXAM: CT ABDOMEN AND PELVIS WITH CONTRAST TECHNIQUE: Multidetector CT imaging of the abdomen and pelvis was performed using the standard protocol following bolus administration of intravenous contrast. RADIATION DOSE REDUCTION: This exam was performed according to the departmental dose-optimization program which includes automated exposure control, adjustment of the mA and/or kV according to patient size and/or use of iterative reconstruction technique. CONTRAST:  75mL OMNIPAQUE IOHEXOL 350 MG/ML SOLN COMPARISON:  10/14/2022 FINDINGS: Lower chest: Small right pleural effusion. Associated bilateral lower lobe atelectasis. Hepatobiliary: Liver is within normal limits. Layering small gallstones (series 3/image 27), without associated  inflammatory changes. No intrahepatic or extrahepatic duct dilatation. Pancreas: Within normal limits. Spleen: Within normal  limits. Adrenals/Urinary Tract: Adrenal glands are within normal limits. Kidneys are within normal limits.  No hydronephrosis. Bladder is within normal limits. Stomach/Bowel: Stomach is within normal limits. No evidence of bowel obstruction. Normal appendix (series 3/image 46). Left colonic diverticulosis, without evidence of diverticulitis. Vascular/Lymphatic: No evidence of abdominal aortic aneurysm. Atherosclerotic calcifications of the abdominal aorta and branch vessels, although vessels remain patent. Small right iliac nodes, including a dominant 10 mm short axis right obturator node (series 3/image 56), previously 12 mm. Reproductive: Mildly heterogeneous uterus with endometrial thickening measuring up to 16 mm (series 7/image 33), corresponding to the patient's known endometrial cancer. No adnexal masses. Other: No abdominopelvic ascites. Mild peritoneal disease/omental caking beneath the left mid abdominal wall (series 3/image 45), mildly improved. Minimal perirectal soft tissue in the left lower pelvis (series 3/image 58), improved. Musculoskeletal: Degenerative changes of the visualized thoracolumbar spine. IMPRESSION: Mildly heterogeneous uterus with endometrial thickening, corresponding to the patient's known endometrial cancer. Small right pelvic nodal metastases, improved. Mild peritoneal disease/omental caking, improved Small right pleural effusion. Electronically Signed   By: Charline Bills M.D.   On: 12/23/2022 02:18       Cycle #3 of carbo/taxol and trastuzumab on 12/09/22  Interval History: Doing well.  Denies any significant issues with chemotherapy.  Reports good appetite without nausea.  Denies any abdominal pain.  Has intermittent mild cramping with episodes of spotting.  Describes spotting is very small in amount and not daily.  Reports good bowel function, only  has to use MiraLAX occasionally.  Denies any urinary symptoms.  Nuys neuropathy.  Balance is similar to before starting treatment.  Past Medical/Surgical History: Past Medical History:  Diagnosis Date   Colitis    Endometrial cancer (HCC)    Hyperlipidemia    Hypertension    Malnutrition (HCC)    Stroke (HCC) 2019   balance still affected    Past Surgical History:  Procedure Laterality Date   BIOPSY  02/26/2018   Procedure: BIOPSY;  Surgeon: Charlott Rakes, MD;  Location: [facility] ENDOSCOPY;  Service: Endoscopy;;   COLONOSCOPY WITH PROPOFOL N/A 02/26/2018   Procedure: COLONOSCOPY WITH PROPOFOL;  Surgeon: Charlott Rakes, MD;  Location: [facility] ENDOSCOPY;  Service: Endoscopy;  Laterality: N/A;   ESOPHAGOGASTRODUODENOSCOPY (EGD) WITH PROPOFOL N/A 02/26/2018   Procedure: ESOPHAGOGASTRODUODENOSCOPY (EGD) WITH PROPOFOL;  Surgeon: Charlott Rakes, MD;  Location: [facility] ENDOSCOPY;  Service: Endoscopy;  Laterality: N/A;   IR IMAGING GUIDED PORT INSERTION  10/21/2022   NO PAST SURGERIES     UNCERTAIN OF NAME OF SURGERY    Family History  Problem Relation Age of Onset   Alzheimer's disease Mother    Colon cancer Father    Endometrial cancer Sister        gyn, unsure type   Breast cancer Neg Hx    Ovarian cancer Neg Hx    Prostate cancer Neg Hx    Pancreatic cancer Neg Hx     Social History   Socioeconomic History   Marital status: Married    Spouse name: Not on file   Number of children: Not on file   Years of education: Not on file   Highest education level: Not on file  Occupational History   Not on file  Tobacco Use   Smoking status: Former    Current packs/day: 0.00    Types: Cigarettes    Quit date: 1994    Years since quitting: 30.6   Smokeless tobacco: Never  Vaping Use   Vaping status: Never Used  Substance and Sexual Activity   Alcohol use: Yes    Comment: occ wine   Drug use: Not Currently   Sexual activity: Yes  Other Topics Concern   Not on file  Social  History Narrative   Not on file   Social Determinants of Health   Financial Resource Strain: Not on file  Food Insecurity: No Food Insecurity (10/16/2022)   Hunger Vital Sign    Worried About Running Out of Food in the Last Year: Never true    Ran Out of Food in the Last Year: Never true  Transportation Needs: No Transportation Needs (10/16/2022)   PRAPARE - Administrator, Civil Service (Medical): No    Lack of Transportation (Non-Medical): No  Physical Activity: Not on file  Stress: Not on file  Social Connections: Not on file    Current Medications:  Current Outpatient Medications:    acetaminophen (TYLENOL) 325 MG tablet, Take 1-2 tablets (325-650 mg total) by mouth every 4 (four) hours as needed for mild pain., Disp: , Rfl:    aspirin EC 81 MG tablet, Take 81 mg by mouth daily., Disp: , Rfl:    benazepril-hydrochlorthiazide (LOTENSIN HCT) 20-25 MG tablet, Take 1 tablet by mouth daily., Disp: , Rfl:    [employer] VITAMIN C 500 MG tablet, TAKE 1 TABLET BY MOUTH TWICE A DAY, Disp: 100 tablet, Rfl: 0   ezetimibe (ZETIA) 10 MG tablet, Take 10 mg by mouth daily., Disp: , Rfl:    lidocaine-prilocaine (EMLA) cream, Apply to affected area once (Patient taking differently: Apply 1 Application topically daily as needed (prior to port access).), Disp: 30 g, Rfl: 3   Multiple Vitamin (MULTIVITAMIN) capsule, Take 1 capsule by mouth daily., Disp: , Rfl:    ondansetron (ZOFRAN) 8 MG tablet, Take 1 tablet (8 mg total) by mouth every 8 (eight) hours as needed for nausea., Disp: 30 tablet, Rfl: 3   oxyCODONE (OXY IR/ROXICODONE) 5 MG immediate release tablet, Take 1 tablet (5 mg total) by mouth every 6 (six) hours as needed for severe pain., Disp: 30 tablet, Rfl: 0   prochlorperazine (COMPAZINE) 10 MG tablet, Take 1 tablet (10 mg total) by mouth every 6 (six) hours as needed for nausea or vomiting., Disp: 30 tablet, Rfl: 1   thiamine (VITAMIN B-1) 100 MG tablet, TAKE 1 TABLET BY MOUTH EVERY DAY,  Disp: 30 tablet, Rfl: 0  Review of Systems: Denies appetite changes, fevers, chills, fatigue, unexplained weight changes. Denies hearing loss, neck lumps or masses, mouth sores, ringing in ears or voice changes. Denies cough or wheezing.  Denies shortness of breath. Denies chest pain or palpitations. Denies leg swelling. Denies abdominal distention, pain, blood in stools, constipation, diarrhea, nausea, vomiting, or early satiety. Denies pain with intercourse, dysuria, frequency, hematuria or incontinence. Denies hot flashes, pelvic pain, vaginal bleeding or vaginal discharge.   Denies joint pain, back pain or muscle pain/cramps. Denies itching, rash, or wounds. Denies dizziness, headaches, numbness or seizures. Denies swollen lymph nodes or glands, denies easy bruising or bleeding. Denies anxiety, depression, confusion, or decreased concentration.  Physical Exam: BP 127/73 (BP Location: Left Arm, Patient Position: Sitting)   Pulse 65   Temp 98.5 F (36.9 C) (Oral)   Resp 16   Ht 5\' 2"  (1.575 m)   Wt 185 lb 3.2 oz (84 kg)   SpO2 100%   BMI 33.87 kg/m  General: Alert, oriented, no acute distress. HEENT: Normocephalic, atraumatic, sclera anicteric. Chest: Clear to auscultation bilaterally.  No  wheezes or rhonchi. Cardiovascular: Regular rate and rhythm, no murmurs. Abdomen: Obese, soft, nontender.  Normoactive bowel sounds.  No masses or hepatosplenomegaly appreciated.   Extremities: Grossly normal range of motion.  Warm, well perfused.  No edema bilaterally. Skin: No rashes or lesions noted. Lymphatics: No cervical, supraclavicular, or inguinal adenopathy. GU: Normal appearing external genitalia without erythema, excoriation, or lesions.  Bimanual exam reveals uterus approximately 8-10 cm, mobile, no nodularity within the cul-de-sac.  Rectovaginal exam confirms findings, no definite involvement of the rectum.  Laboratory & Radiologic Studies: CT A/P 12/17/22: Mildly heterogeneous  uterus with endometrial thickening, corresponding to the patient's known endometrial cancer. Small right pelvic nodal metastases, improved. Mild peritoneal disease/omental caking, improved Small right pleural effusion.  Assessment & Plan: Maria Avila is a 72 y.o. 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  Patient has done very well with neoadjuvant chemotherapy.  Reviewed her CT findings which show overall good response to treatment.  Given response, I recommend proceeding with interval debulking surgery.  We discussed plan for hysterectomy with BSO, tumor debulking.  Will balance her risk with major surgery (history of DVT, history of stroke) with achieving cytoreductive surgery.  Hopefully, her disease will allow for at least a portion of the surgery to be done minimally invasive.  We reviewed the plan for a diagnostic laparoscopy, open vs robotic assisted hysterectomy, bilateral salpingo-oophorectomy, tumor debulking, and any other indicated procedures. The risks of surgery were discussed in detail and she understands these to include infection; wound separation; hernia; vaginal cuff separation, injury to adjacent organs such as bowel, bladder, blood vessels, ureters and nerves; bleeding which may require blood transfusion; anesthesia risk; thromboembolic events; possible death; unforeseen complications; possible need for re-exploration; medical complications such as heart attack, stroke, pleural effusion and pneumonia; and, if full lymphadenectomy is performed the risk of lymphedema and lymphocyst. The patient will receive DVT and antibiotic prophylaxis as indicated. She voiced a clear understanding. She had the opportunity to ask questions. Perioperative instructions were reviewed with her. Prescriptions for post-op medications were sent to her pharmacy of choice. Plan for bowel prep given imaging although no clear rectal  involvement on exam.  Given history of bilateral DVT, plan for preop bilateral dopplers.  We discussed need for 2 vs 4 weeks of prophylactic anticoagulation (depending on whether MIS vs open surgery).  Plan to repeat CBC with differential for early next week given low white count with her last set of labs.  28 minutes of total time was spent for this patient encounter, including preparation, face-to-face counseling with the patient and coordination of care, and documentation of the encounter.  Eugene Garnet, MD  Division of Gynecologic Oncology  Department of Obstetrics and Gynecology  [facility]

## 2022-12-31 NOTE — Patient Instructions (Signed)
Preparing for your Surgery  Plan for surgery on January 07, 2023 with Dr. Eugene Garnet at [facility]. You will be scheduled for diagnostic laparoscopy (looking into the abdomen through a small incision with a camera), robotic assisted laparoscopic (through small incisions) versus open (through large incision) total hysterectomy (removal of the uterus and cervix), bilateral salpingo-oophorectomy (removal of both ovaries and fallopian tubes), omentectomy (removal of the omentum), debulking.   Pre-operative Testing -(Done, 8/23) You will receive a phone call from presurgical testing at [facility] to arrange for a pre-operative appointment and lab work.  -Bring your insurance card, copy of an advanced directive if applicable, medication list  -At that visit, you will be asked to sign a consent for a possible blood transfusion in case a transfusion becomes necessary during surgery.  The need for a blood transfusion is rare but having consent is a necessary part of your care.     -You can continue taking your baby aspirin (81mg ) up until surgery with the last dose being the DAY BEFORE surgery.  -Do not take supplements such as fish oil (omega 3), red yeast rice, turmeric before your surgery. You want to avoid medications with aspirin in them including headache powders such as BC or Goody's), Excedrin migraine.  Day Before Surgery at Home Please follow bowel prep instructions. You will be advised you can have clear liquids up until 3 hours before your surgery.    AVOID GAS PRODUCING FOODS AND BEVERAGES. Things to avoid include carbonated beverages (fizzy beverages, sodas), raw fruits and raw vegetables (uncooked), or beans.   If your bowels are filled with gas, your surgeon will have difficulty visualizing your pelvic organs which increases your surgical risks.  Your role in recovery Your role is to become active as soon as directed by your doctor, while still giving  yourself time to heal.  Rest when you feel tired. You will be asked to do the following in order to speed your recovery:  - Cough and breathe deeply. This helps to clear and expand your lungs and can prevent pneumonia after surgery.  - STAY ACTIVE WHEN YOU GET HOME. Do mild physical activity. Walking or moving your legs help your circulation and body functions return to normal. Do not try to get up or walk alone the first time after surgery.   -If you develop swelling on one leg or the other, pain in the back of your leg, redness/warmth in one of your legs, please call the office or go to the Emergency Room to have a doppler to rule out a blood clot. For shortness of breath, chest pain-seek care in the Emergency Room as soon as possible. - Actively manage your pain. Managing your pain lets you move in comfort. We will ask you to rate your pain on a scale of zero to 10. It is your responsibility to tell your doctor or nurse where and how much you hurt so your pain can be treated.  Special Considerations -If you are diabetic, you may be placed on insulin after surgery to have closer control over your blood sugars to promote healing and recovery.  This does not mean that you will be discharged on insulin.  If applicable, your oral antidiabetics will be resumed when you are tolerating a solid diet.  -Your final pathology results from surgery should be available around one week after surgery and the results will be relayed to you when available.  -FMLA forms can be faxed to [phone]  and please allow 5-7 business days for completion.  Pain Management After Surgery -Make sure that you have Tylenol IF YOU ARE ABLE TO TAKE THESE MEDICATION at home to use on a regular basis after surgery for pain control.   -Review the attached handout on narcotic use and their risks and side effects.   Bowel Regimen -You will be prescribed Sennakot-S to take nightly to prevent constipation especially if you are taking  the narcotic pain medication intermittently.  It is important to prevent constipation and drink adequate amounts of liquids. You can stop taking this medication when you are not taking pain medication and you are back on your normal bowel routine.  Risks of Surgery Risks of surgery are low but include bleeding, infection, damage to surrounding structures, re-operation, blood clots, and very rarely death.   Blood Transfusion Information (For the consent to be signed before surgery)  We will be checking your blood type before surgery so in case of emergencies, we will know what type of blood you would need.                                            WHAT IS A BLOOD TRANSFUSION?  A transfusion is the replacement of blood or some of its parts. Blood is made up of multiple cells which provide different functions. Red blood cells carry oxygen and are used for blood loss replacement. White blood cells fight against infection. Platelets control bleeding. Plasma helps clot blood. Other blood products are available for specialized needs, such as hemophilia or other clotting disorders. BEFORE THE TRANSFUSION  Who gives blood for transfusions?  You may be able to donate blood to be used at a later date on yourself (autologous donation). Relatives can be asked to donate blood. This is generally not any safer than if you have received blood from a stranger. The same precautions are taken to ensure safety when a relative's blood is donated. Healthy volunteers who are fully evaluated to make sure their blood is safe. This is blood bank blood. Transfusion therapy is the safest it has ever been in the practice of medicine. Before blood is taken from a donor, a complete history is taken to make sure that person has no history of diseases nor engages in risky social behavior (examples are intravenous drug use or sexual activity with multiple partners). The donor's travel history is screened to minimize risk of  transmitting infections, such as malaria. The donated blood is tested for signs of infectious diseases, such as HIV and hepatitis. The blood is then tested to be sure it is compatible with you in order to minimize the chance of a transfusion reaction. If you or a relative donates blood, this is often done in anticipation of surgery and is not appropriate for emergency situations. It takes many days to process the donated blood. RISKS AND COMPLICATIONS Although transfusion therapy is very safe and saves many lives, the main dangers of transfusion include:  Getting an infectious disease. Developing a transfusion reaction. This is an allergic reaction to something in the blood you were given. Every precaution is taken to prevent this. The decision to have a blood transfusion has been considered carefully by your caregiver before blood is given. Blood is not given unless the benefits outweigh the risks.  AFTER SURGERY INSTRUCTIONS  Return to work: 4-6 weeks if applicable  You  may have a white honeycomb dressing over your larger incision if this takes place. This dressing can be removed 5 days after surgery and you do not need to reapply a new dressing. Once you remove the dressing, you will notice that you have the surgical glue (dermabond) on the incision and this will peel off on its own. You can get this dressing wet in the shower the days after surgery prior to removal on the 5th day.   You will need to be on a blood thinner after surgery to prevent blood clots for 2 weeks versus 4 weeks based on the surgery being through small incisions or a large incision. This can be given in pill form or injections.   AVOID USE OF NSAIDS (IBUPROFEN, NAPROXEN) WHILE TAKING THE BLOOD THINNER.   Activity: 1. Be up and out of the bed during the day.  Take a nap if needed.  You may walk up steps but be careful and use the hand rail.  Stair climbing will tire you more than you think, you may need to stop part way and  rest.   2. No lifting or straining for 6 weeks over 10 pounds. No pushing, pulling, straining for 6 weeks.  3. No driving for around 1-2 week(s).  Do not drive if you are taking narcotic pain medicine and make sure that your reaction time has returned.   4. You can shower as soon as the next day after surgery. Shower daily.  Use your regular soap and water (not directly on the incision) and pat your incision(s) dry afterwards; don't rub.  No tub baths or submerging your body in water until cleared by your surgeon. If you have the soap that was given to you by pre-surgical testing that was used before surgery, you do not need to use it afterwards because this can irritate your incisions.   5. No sexual activity and nothing in the vagina for 8-10 weeks.  6. You may experience a small amount of clear drainage from your incisions, which is normal.  If the drainage persists, increases, or changes color please call the office.  7. Do not use creams, lotions, or ointments such as neosporin on your incisions after surgery until advised by your surgeon because they can cause removal of the dermabond glue on your incisions.    8. You may experience vaginal spotting after surgery or when the stitches at the top of the vagina begin to dissolve.  The spotting is normal but if you experience heavy bleeding, call our office.  9. Take Tylenol first for pain if you are able to take these medication and only use narcotic pain medication for severe pain not relieved by the Tylenol.  Monitor your Tylenol intake to a max of 4,000 mg in a 24 hour period.   Diet: 1. Low sodium Heart Healthy Diet is recommended but you are cleared to resume your normal (before surgery) diet after your procedure.  2. It is safe to use a laxative, such as Miralax or Colace, if you have difficulty moving your bowels. You will be prescribed Sennakot-S to take at bedtime every evening after surgery to keep bowel movements regular and to  prevent constipation.    Wound Care: 1. Keep clean and dry.  Shower daily.  Reasons to call the Doctor: Fever - Oral temperature greater than 100.4 degrees Fahrenheit Foul-smelling vaginal discharge Difficulty urinating Nausea and vomiting Increased pain at the site of the incision that is unrelieved with pain  medicine. Difficulty breathing with or without chest pain New calf pain especially if only on one side Sudden, continuing increased vaginal bleeding with or without clots.   Contacts: For questions or concerns you should contact:  Dr. Eugene Garnet at [phone]  Warner Mccreedy, NP at [phone]  After Hours: call [phone] and have the GYN Oncologist paged/contacted (after 5 pm or on the weekends). You will speak with an after hours RN and let he or she know you have had surgery.  Messages sent via mychart are for non-urgent matters and are not responded to after hours so for urgent needs, please call the after hours number.   COLON BOWEL PREP     FIVE DAYS PRIOR TO YOUR SURGERY   Obtain supplies for the bowel prep at a pharmacy of your choice: Office will send in  prescription for your antibiotic pills (Neomycin and Flagyl (avoid alcohol use with Flagyl)  A bottle of Miralax (238 g)- prescription sent in A large bottle of Gatorade/Powerade (64 oz), avoid red/purple coloring- no prescription required Dulcolax tablets (4 tablets)- prescription sent in   Change your diet to make the bowel prep go more easily: Switch to a bland, low fiber diet Stop eating any nuts, popcorn, or fruit with seeds.  Stop all fiber supplements such as Metamucil, Miralax, etc.     Improve nutrition: Consider drinking 2-3 nutritional shakes (Ex: Ensure Surgery) every day, starting 5 days prior to surgery     DAY PRIOR TO SURGERY    Switch to a clear liquid diet the day before surgery. AVOID CARBONATED BEVERAGES Drink plenty of liquids all day to avoid getting dehydrated       7:00 am Swallow 4 dulcolax tablets with some water   10:00 am Mix the bottle of Miralax with the 64 ounces of Gatorade Drink the Gatorade/Miralax mixture gradually (8 oz glass every 15 minutes) until gone. (You should finish in 4 hours)   2:00pm Take 2 Flagyl (total of 1000 mg) tablets and 2 tablets of neomycin (1000 mg total)   3:00pm Take 2 Flagyl (total of 1000 mg) tablets and 2 tablets of neomycin (1000 mg total) Drink plenty of clear liquids all evening to avoid getting dehydrated   10:00pm Take 2 Flagyl (total of 1000 mg) tablets and 2 tablets of neomycin (1000 mg total) Drink 2 Carbohydrate loading nutrition drinks (ex: Ensure Presurgery). These will be given at pre-op appointment. Do not eat anything solid after bedtime (midnight) the night before your surgery.   BUT DO drink plenty of clear liquids (Water, Gatorade, juice, soda, coffee, tea, broths, etc.) up to 3 hours prior to surgery to avoid getting dehydrated.      MORNING OF SURGERY Remember to not to eat anything solid that morning  Drink one final carbohydrate loading nutritional drink (ex: Ensure Presurgery) upon waking up in the morning (needs to be 3 hours before your surgery). Hold or take medications as recommended by the hospital staff at your Preoperative visit Stop drinking liquids before you leave the house (>3 hours prior to surgery)

## 2022-12-31 NOTE — Progress Notes (Unsigned)
Gynecologic Oncology Return Clinic Visit  01/01/23  Reason for Visit: treatment planning  Treatment History: Oncology History Overview Note  MMR normal, Her2 positive   Uterine cancer (HCC)  08/12/2022 Initial Diagnosis   She presented with PMB   09/24/2022 Pathology Results   Diagnosis 1. Endometrium, curettage - HIGH GRADE SEROUS CARCINOMA (SEE NOTE) 2. Endometrium, resection - HIGH GRADE SEROUS CARCINOMA (SEE NOTE) Diagnosis Note 1. - immunohistochemical stain reveal tumor cells are positive for p16. P53 shows strong expression consistent with mutant type. This case was reviewed with Dr. Reynolds Bowl who agrees with the diagnosis   10/14/2022 Imaging   1. Ill-defined fullness of the uterus and endometrial cavity without discretely visualized mass, in keeping with patient's known endometrial malignancy. 2. Enlarged right iliac and pelvic sidewall lymph nodes, as well as enlarged epicardial lymph nodes or soft tissue nodules. 3. Scattered peritoneal and omental soft tissue nodularity, particularly notable in the left upper quadrant. 4. Soft tissue matting about the distal sigmoid colon and rectum in the low pelvis. 5. Constellation of findings is consistent with nodal, peritoneal, and omental metastatic disease. 6. Small right pleural effusion, nonspecific although modestly suspicious for a malignant effusion, however without directly visualized pleural mass or nodularity. 7. Pancolonic diverticulosis, severe in the descending and sigmoid colon. 8. Cholelithiasis. 9. Coronary artery disease.   10/17/2022 Initial Diagnosis   Uterine cancer (HCC)   10/17/2022 Cancer Staging   Staging form: Corpus Uteri - Carcinoma and Carcinosarcoma, AJCC 8th Edition - Clinical stage from 10/17/2022: FIGO Stage IVB (cT1b, cN1, cM1) - Signed by Artis Delay, MD on 10/17/2022 Stage prefix: Initial diagnosis   10/23/2022 Echocardiogram   1. Left ventricular ejection fraction, by estimation, is 65 to 70%. Left  ventricular ejection fraction by 2D MOD biplane is 68.1 %. The left ventricle has normal function. The left ventricle has no regional wall motion abnormalities. There is mild left ventricular hypertrophy. Left ventricular diastolic parameters are consistent with Grade I diastolic dysfunction (impaired relaxation).  2. Right ventricular systolic function is normal. The right ventricular size is normal. There is normal pulmonary artery systolic pressure. The estimated right ventricular systolic pressure is 27.6 mmHg.  3. The mitral valve is abnormal. Trivial mitral valve regurgitation.  4. The aortic valve is tricuspid. Aortic valve regurgitation is not visualized. Aortic valve sclerosis is present, with no evidence of aortic valve stenosis.  5. The inferior vena cava is normal in size with greater than 50% respiratory variability, suggesting right atrial pressure of 3 mmHg.   10/28/2022 -  Chemotherapy   Patient is on Treatment Plan : UTERINE SEROUS CARCINOMA Carboplatin + Paclitaxel + Trastuzumab q21d x 6 Cycles / Trastuzumab q21d     12/23/2022 Imaging   CT ABDOMEN PELVIS W CONTRAST  Result Date: 12/23/2022 CLINICAL DATA:  Follow-up endometrial cancer, status post chemotherapy EXAM: CT ABDOMEN AND PELVIS WITH CONTRAST TECHNIQUE: Multidetector CT imaging of the abdomen and pelvis was performed using the standard protocol following bolus administration of intravenous contrast. RADIATION DOSE REDUCTION: This exam was performed according to the departmental dose-optimization program which includes automated exposure control, adjustment of the mA and/or kV according to patient size and/or use of iterative reconstruction technique. CONTRAST:  75mL OMNIPAQUE IOHEXOL 350 MG/ML SOLN COMPARISON:  10/14/2022 FINDINGS: Lower chest: Small right pleural effusion. Associated bilateral lower lobe atelectasis. Hepatobiliary: Liver is within normal limits. Layering small gallstones (series 3/image 27), without associated  inflammatory changes. No intrahepatic or extrahepatic duct dilatation. Pancreas: Within normal limits. Spleen: Within normal  limits. Adrenals/Urinary Tract: Adrenal glands are within normal limits. Kidneys are within normal limits.  No hydronephrosis. Bladder is within normal limits. Stomach/Bowel: Stomach is within normal limits. No evidence of bowel obstruction. Normal appendix (series 3/image 46). Left colonic diverticulosis, without evidence of diverticulitis. Vascular/Lymphatic: No evidence of abdominal aortic aneurysm. Atherosclerotic calcifications of the abdominal aorta and branch vessels, although vessels remain patent. Small right iliac nodes, including a dominant 10 mm short axis right obturator node (series 3/image 56), previously 12 mm. Reproductive: Mildly heterogeneous uterus with endometrial thickening measuring up to 16 mm (series 7/image 33), corresponding to the patient's known endometrial cancer. No adnexal masses. Other: No abdominopelvic ascites. Mild peritoneal disease/omental caking beneath the left mid abdominal wall (series 3/image 45), mildly improved. Minimal perirectal soft tissue in the left lower pelvis (series 3/image 58), improved. Musculoskeletal: Degenerative changes of the visualized thoracolumbar spine. IMPRESSION: Mildly heterogeneous uterus with endometrial thickening, corresponding to the patient's known endometrial cancer. Small right pelvic nodal metastases, improved. Mild peritoneal disease/omental caking, improved Small right pleural effusion. Electronically Signed   By: Charline Bills M.D.   On: 12/23/2022 02:18       Cycle #3 of carbo/taxol and trastuzumab on 12/09/22  Interval History: Doing well.  Denies any significant issues with chemotherapy.  Reports good appetite without nausea.  Denies any abdominal pain.  Has intermittent mild cramping with episodes of spotting.  Describes spotting is very small in amount and not daily.  Reports good bowel function, only  has to use MiraLAX occasionally.  Denies any urinary symptoms.  Nuys neuropathy.  Balance is similar to before starting treatment.  Past Medical/Surgical History: Past Medical History:  Diagnosis Date   Colitis    Endometrial cancer (HCC)    Hyperlipidemia    Hypertension    Malnutrition (HCC)    Stroke (HCC) 2019   balance still affected    Past Surgical History:  Procedure Laterality Date   BIOPSY  02/26/2018   Procedure: BIOPSY;  Surgeon: Charlott Rakes, MD;  Location: [facility] ENDOSCOPY;  Service: Endoscopy;;   COLONOSCOPY WITH PROPOFOL N/A 02/26/2018   Procedure: COLONOSCOPY WITH PROPOFOL;  Surgeon: Charlott Rakes, MD;  Location: [facility] ENDOSCOPY;  Service: Endoscopy;  Laterality: N/A;   ESOPHAGOGASTRODUODENOSCOPY (EGD) WITH PROPOFOL N/A 02/26/2018   Procedure: ESOPHAGOGASTRODUODENOSCOPY (EGD) WITH PROPOFOL;  Surgeon: Charlott Rakes, MD;  Location[facility] ENDOSCOPY;  Service: Endoscopy;  Laterality: N/A;   IR IMAGING GUIDED PORT INSERTION  10/21/2022   NO PAST SURGERIES     UNCERTAIN OF NAME OF SURGERY    Family History  Problem Relation Age of Onset   Alzheimer's disease Mother    Colon cancer Father    Endometrial cancer Sister        gyn, unsure type   Breast cancer Neg Hx    Ovarian cancer Neg Hx    Prostate cancer Neg Hx    Pancreatic cancer Neg Hx     Social History   Socioeconomic History   Marital status: Married    Spouse name: Not on file   Number of children: Not on file   Years of education: Not on file   Highest education level: Not on file  Occupational History   Not on file  Tobacco Use   Smoking status: Former    Current packs/day: 0.00    Types: Cigarettes    Quit date: 1994    Years since quitting: 30.6   Smokeless tobacco: Never  Vaping Use   Vaping status: Never Used  Substance and Sexual Activity   Alcohol use: Yes    Comment: occ wine   Drug use: Not Currently   Sexual activity: Yes  Other Topics Concern   Not on file  Social  History Narrative   Not on file   Social Determinants of Health   Financial Resource Strain: Not on file  Food Insecurity: No Food Insecurity (10/16/2022)   Hunger Vital Sign    Worried About Running Out of Food in the Last Year: Never true    Ran Out of Food in the Last Year: Never true  Transportation Needs: No Transportation Needs (10/16/2022)   PRAPARE - Administrator, Civil Service (Medical): No    Lack of Transportation (Non-Medical): No  Physical Activity: Not on file  Stress: Not on file  Social Connections: Not on file    Current Medications:  Current Outpatient Medications:    acetaminophen (TYLENOL) 325 MG tablet, Take 1-2 tablets (325-650 mg total) by mouth every 4 (four) hours as needed for mild pain., Disp: , Rfl:    aspirin EC 81 MG tablet, Take 81 mg by mouth daily., Disp: , Rfl:    benazepril-hydrochlorthiazide (LOTENSIN HCT) 20-25 MG tablet, Take 1 tablet by mouth daily., Disp: , Rfl:    [facility] VITAMIN C 500 MG tablet, TAKE 1 TABLET BY MOUTH TWICE A DAY, Disp: 100 tablet, Rfl: 0   ezetimibe (ZETIA) 10 MG tablet, Take 10 mg by mouth daily., Disp: , Rfl:    lidocaine-prilocaine (EMLA) cream, Apply to affected area once (Patient taking differently: Apply 1 Application topically daily as needed (prior to port access).), Disp: 30 g, Rfl: 3   Multiple Vitamin (MULTIVITAMIN) capsule, Take 1 capsule by mouth daily., Disp: , Rfl:    ondansetron (ZOFRAN) 8 MG tablet, Take 1 tablet (8 mg total) by mouth every 8 (eight) hours as needed for nausea., Disp: 30 tablet, Rfl: 3   oxyCODONE (OXY IR/ROXICODONE) 5 MG immediate release tablet, Take 1 tablet (5 mg total) by mouth every 6 (six) hours as needed for severe pain., Disp: 30 tablet, Rfl: 0   prochlorperazine (COMPAZINE) 10 MG tablet, Take 1 tablet (10 mg total) by mouth every 6 (six) hours as needed for nausea or vomiting., Disp: 30 tablet, Rfl: 1   thiamine (VITAMIN B-1) 100 MG tablet, TAKE 1 TABLET BY MOUTH EVERY DAY,  Disp: 30 tablet, Rfl: 0  Review of Systems: Denies appetite changes, fevers, chills, fatigue, unexplained weight changes. Denies hearing loss, neck lumps or masses, mouth sores, ringing in ears or voice changes. Denies cough or wheezing.  Denies shortness of breath. Denies chest pain or palpitations. Denies leg swelling. Denies abdominal distention, pain, blood in stools, constipation, diarrhea, nausea, vomiting, or early satiety. Denies pain with intercourse, dysuria, frequency, hematuria or incontinence. Denies hot flashes, pelvic pain, vaginal bleeding or vaginal discharge.   Denies joint pain, back pain or muscle pain/cramps. Denies itching, rash, or wounds. Denies dizziness, headaches, numbness or seizures. Denies swollen lymph nodes or glands, denies easy bruising or bleeding. Denies anxiety, depression, confusion, or decreased concentration.  Physical Exam: BP 127/73 (BP Location: Left Arm, Patient Position: Sitting)   Pulse 65   Temp 98.5 F (36.9 C) (Oral)   Resp 16   Ht 5\' 2"  (1.575 m)   Wt 185 lb 3.2 oz (84 kg)   SpO2 100%   BMI 33.87 kg/m  General: Alert, oriented, no acute distress. HEENT: Normocephalic, atraumatic, sclera anicteric. Chest: Clear to auscultation bilaterally.  No  wheezes or rhonchi. Cardiovascular: Regular rate and rhythm, no murmurs. Abdomen: Obese, soft, nontender.  Normoactive bowel sounds.  No masses or hepatosplenomegaly appreciated.   Extremities: Grossly normal range of motion.  Warm, well perfused.  No edema bilaterally. Skin: No rashes or lesions noted. Lymphatics: No cervical, supraclavicular, or inguinal adenopathy. GU: Normal appearing external genitalia without erythema, excoriation, or lesions.  Bimanual exam reveals uterus approximately 8-10 cm, mobile, no nodularity within the cul-de-sac.  Rectovaginal exam confirms findings, no definite involvement of the rectum.  Laboratory & Radiologic Studies: CT A/P 12/17/22: Mildly heterogeneous  uterus with endometrial thickening, corresponding to the patient's known endometrial cancer. Small right pelvic nodal metastases, improved. Mild peritoneal disease/omental caking, improved Small right pleural effusion.  Assessment & Plan: Maria Avila is a 72 y.o. 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  Patient has done very well with neoadjuvant chemotherapy.  Reviewed her CT findings which show overall good response to treatment.  Given response, I recommend proceeding with interval debulking surgery.  We discussed plan for hysterectomy with BSO, tumor debulking.  Will balance her risk with major surgery (history of DVT, history of stroke) with achieving cytoreductive surgery.  Hopefully, her disease will allow for at least a portion of the surgery to be done minimally invasive.  We reviewed the plan for a diagnostic laparoscopy, open vs robotic assisted hysterectomy, bilateral salpingo-oophorectomy, tumor debulking, and any other indicated procedures. The risks of surgery were discussed in detail and she understands these to include infection; wound separation; hernia; vaginal cuff separation, injury to adjacent organs such as bowel, bladder, blood vessels, ureters and nerves; bleeding which may require blood transfusion; anesthesia risk; thromboembolic events; possible death; unforeseen complications; possible need for re-exploration; medical complications such as heart attack, stroke, pleural effusion and pneumonia; and, if full lymphadenectomy is performed the risk of lymphedema and lymphocyst. The patient will receive DVT and antibiotic prophylaxis as indicated. She voiced a clear understanding. She had the opportunity to ask questions. Perioperative instructions were reviewed with her. Prescriptions for post-op medications were sent to her pharmacy of choice. Plan for bowel prep given imaging although no clear rectal  involvement on exam.  Given history of bilateral DVT, plan for preop bilateral dopplers.  We discussed need for 2 vs 4 weeks of prophylactic anticoagulation (depending on whether MIS vs open surgery).  Plan to repeat CBC with differential for early next week given low white count with her last set of labs.  28 minutes of total time was spent for this patient encounter, including preparation, face-to-face counseling with the patient and coordination of care, and documentation of the encounter.  Eugene Garnet, MD  Division of Gynecologic Oncology  Department of Obstetrics and Gynecology  [facility]

## 2023-01-01 ENCOUNTER — Inpatient Hospital Stay (HOSPITAL_BASED_OUTPATIENT_CLINIC_OR_DEPARTMENT_OTHER): Payer: Medicare Other | Admitting: Gynecologic Oncology

## 2023-01-01 ENCOUNTER — Encounter: Payer: Self-pay | Admitting: Gynecologic Oncology

## 2023-01-01 VITALS — BP 127/73 | HR 65 | Temp 98.5°F | Resp 16 | Ht 62.0 in | Wt 185.2 lb

## 2023-01-01 DIAGNOSIS — C55 Malignant neoplasm of uterus, part unspecified: Secondary | ICD-10-CM | POA: Diagnosis not present

## 2023-01-01 DIAGNOSIS — Z8673 Personal history of transient ischemic attack (TIA), and cerebral infarction without residual deficits: Secondary | ICD-10-CM

## 2023-01-01 DIAGNOSIS — Z86718 Personal history of other venous thrombosis and embolism: Secondary | ICD-10-CM

## 2023-01-01 DIAGNOSIS — C549 Malignant neoplasm of corpus uteri, unspecified: Secondary | ICD-10-CM

## 2023-01-01 DIAGNOSIS — Z5111 Encounter for antineoplastic chemotherapy: Secondary | ICD-10-CM | POA: Diagnosis not present

## 2023-01-01 DIAGNOSIS — C786 Secondary malignant neoplasm of retroperitoneum and peritoneum: Secondary | ICD-10-CM | POA: Diagnosis not present

## 2023-01-01 DIAGNOSIS — D6481 Anemia due to antineoplastic chemotherapy: Secondary | ICD-10-CM | POA: Diagnosis not present

## 2023-01-01 DIAGNOSIS — Z5112 Encounter for antineoplastic immunotherapy: Secondary | ICD-10-CM | POA: Diagnosis not present

## 2023-01-01 DIAGNOSIS — R971 Elevated cancer antigen 125 [CA 125]: Secondary | ICD-10-CM | POA: Diagnosis not present

## 2023-01-01 DIAGNOSIS — C775 Secondary and unspecified malignant neoplasm of intrapelvic lymph nodes: Secondary | ICD-10-CM | POA: Diagnosis not present

## 2023-01-01 NOTE — Progress Notes (Signed)
Patient here for a pre-operative appointment prior to her scheduled surgery on 01/07/2023. She is scheduled for a diagnostic laparoscopy, robotic assisted laparoscopic versus open total hysterectomy, bilateral salpingo-oophorectomy , omentectomy, debulking. .She had her pre-admission testing appointment on 12/26/2022 at [facility].  The surgery was discussed in detail.  See after visit summary for additional details. Visual aids used to discuss items related to surgery including the incentive spirometer, sequential compression stockings, foley catheter, IV pump, multi-modal pain regimen including tylenol, photo of the surgical robot, female reproductive system to discuss surgery in detail.      Discussed post-op pain management in detail including the aspects of the enhanced recovery pathway.  We discussed the use of tylenol post-op and to monitor for a maximum of 4,000 mg in a 24 hour period.  Also prescribed sennakot to be used after surgery and to hold if having loose stools.  Discussed bowel regimen and bowel prep in detail.     Discussed the use of SCDs and measures to take at home to prevent DVT including frequent mobility.  Reportable signs and symptoms of DVT discussed. Post-operative instructions discussed and expectations for after surgery. Incisional care discussed as well including reportable signs and symptoms including erythema, drainage, wound separation.     30 minutes spent with the patient.  Verbalizing understanding of material discussed. No needs or concerns voiced at the end of the visit.   Advised patient to call for any needs.  Advised that her post-operative medications had been prescribed and could be picked up at any time.    This appointment is included in the global surgical bundle as pre-operative teaching and has no charge.

## 2023-01-02 ENCOUNTER — Telehealth: Payer: Self-pay | Admitting: Oncology

## 2023-01-02 ENCOUNTER — Other Ambulatory Visit: Payer: Self-pay

## 2023-01-02 MED ORDER — POLYETHYLENE GLYCOL 3350 17 GM/SCOOP PO POWD
ORAL | 0 refills | Status: DC
Start: 2023-01-06 — End: 2023-01-09

## 2023-01-02 MED ORDER — BISACODYL 5 MG PO TBEC
DELAYED_RELEASE_TABLET | ORAL | 0 refills | Status: DC
Start: 2023-01-06 — End: 2023-01-09

## 2023-01-02 MED ORDER — METRONIDAZOLE 500 MG PO TABS
ORAL_TABLET | ORAL | 0 refills | Status: DC
Start: 2023-01-06 — End: 2023-01-09

## 2023-01-02 NOTE — Addendum Note (Signed)
Addended by: Warner Mccreedy D on: 01/02/2023 03:01 PM   Modules accepted: Orders

## 2023-01-02 NOTE — Telephone Encounter (Signed)
[facility] and let her know that Dr. Pricilla Holm would like her to only take Flagyl for her bowel prep and not neomycin. Advised her to follow the bowel prep schedule that was provided except for taking the Neomycin.  Let her know the bowel prep prescriptions have now [facility] to [facility] and should be available for her to pick up.  She verbalized understanding and agreement and didn't have any questions.

## 2023-01-06 ENCOUNTER — Other Ambulatory Visit: Payer: Medicare Other

## 2023-01-06 ENCOUNTER — Telehealth: Payer: Self-pay

## 2023-01-06 ENCOUNTER — Inpatient Hospital Stay: Payer: Medicare Other | Attending: Gynecologic Oncology

## 2023-01-06 ENCOUNTER — Ambulatory Visit (HOSPITAL_BASED_OUTPATIENT_CLINIC_OR_DEPARTMENT_OTHER)
Admission: RE | Admit: 2023-01-06 | Discharge: 2023-01-06 | Disposition: A | Discharge status: Home / Self Care | Payer: Medicare Other | Source: Ambulatory Visit | Attending: Gynecologic Oncology | Admitting: Gynecologic Oncology

## 2023-01-06 DIAGNOSIS — E669 Obesity, unspecified: Secondary | ICD-10-CM | POA: Diagnosis not present

## 2023-01-06 DIAGNOSIS — Z87891 Personal history of nicotine dependence: Secondary | ICD-10-CM | POA: Diagnosis not present

## 2023-01-06 DIAGNOSIS — C541 Malignant neoplasm of endometrium: Secondary | ICD-10-CM | POA: Diagnosis not present

## 2023-01-06 DIAGNOSIS — E785 Hyperlipidemia, unspecified: Secondary | ICD-10-CM | POA: Diagnosis not present

## 2023-01-06 DIAGNOSIS — Z86718 Personal history of other venous thrombosis and embolism: Secondary | ICD-10-CM | POA: Insufficient documentation

## 2023-01-06 DIAGNOSIS — C7989 Secondary malignant neoplasm of other specified sites: Secondary | ICD-10-CM | POA: Diagnosis not present

## 2023-01-06 DIAGNOSIS — Z6833 Body mass index (BMI) 33.0-33.9, adult: Secondary | ICD-10-CM | POA: Diagnosis not present

## 2023-01-06 DIAGNOSIS — E119 Type 2 diabetes mellitus without complications: Secondary | ICD-10-CM | POA: Diagnosis not present

## 2023-01-06 DIAGNOSIS — Z8673 Personal history of transient ischemic attack (TIA), and cerebral infarction without residual deficits: Secondary | ICD-10-CM | POA: Diagnosis not present

## 2023-01-06 DIAGNOSIS — C55 Malignant neoplasm of uterus, part unspecified: Secondary | ICD-10-CM | POA: Diagnosis not present

## 2023-01-06 DIAGNOSIS — Z7982 Long term (current) use of aspirin: Secondary | ICD-10-CM | POA: Diagnosis not present

## 2023-01-06 DIAGNOSIS — E876 Hypokalemia: Secondary | ICD-10-CM | POA: Diagnosis not present

## 2023-01-06 DIAGNOSIS — I10 Essential (primary) hypertension: Secondary | ICD-10-CM | POA: Diagnosis not present

## 2023-01-06 DIAGNOSIS — Z79899 Other long term (current) drug therapy: Secondary | ICD-10-CM | POA: Diagnosis not present

## 2023-01-06 DIAGNOSIS — C7982 Secondary malignant neoplasm of genital organs: Secondary | ICD-10-CM | POA: Diagnosis not present

## 2023-01-06 DIAGNOSIS — G629 Polyneuropathy, unspecified: Secondary | ICD-10-CM | POA: Diagnosis not present

## 2023-01-06 LAB — CMP (CANCER CENTER ONLY)
ALT: 11 U/L (ref 0–44)
AST: 13 U/L — ABNORMAL LOW (ref 15–41)
Albumin: 4.4 g/dL (ref 3.5–5.0)
Alkaline Phosphatase: 62 U/L (ref 38–126)
Anion gap: 7 (ref 5–15)
BUN: 14 mg/dL (ref 8–23)
CO2: 27 mmol/L (ref 22–32)
Calcium: 10.1 mg/dL (ref 8.9–10.3)
Chloride: 106 mmol/L (ref 98–111)
Creatinine: 0.71 mg/dL (ref 0.44–1.00)
GFR, Estimated: >60 mL/min (ref >60)
Glucose, Bld: 102 mg/dL — ABNORMAL HIGH (ref 70–99)
Potassium: 3.4 mmol/L — ABNORMAL LOW (ref 3.5–5.1)
Sodium: 140 mmol/L (ref 135–145)
Total Bilirubin: 0.6 mg/dL (ref 0.3–1.2)
Total Protein: 7.5 g/dL (ref 6.5–8.1)

## 2023-01-06 LAB — CBC WITH DIFFERENTIAL (CANCER CENTER ONLY)
Abs Immature Granulocytes: 0 10*3/uL (ref 0.00–0.07)
Basophils Absolute: 0 10*3/uL (ref 0.0–0.1)
Basophils Relative: 0 %
Eosinophils Absolute: 0.1 10*3/uL (ref 0.0–0.5)
Eosinophils Relative: 2 %
HCT: 32.7 % — ABNORMAL LOW (ref 36.0–46.0)
Hemoglobin: 11.1 g/dL — ABNORMAL LOW (ref 12.0–15.0)
Immature Granulocytes: 0 %
Lymphocytes Relative: 60 %
Lymphs Abs: 2.3 10*3/uL (ref 0.7–4.0)
MCH: 32.6 pg (ref 26.0–34.0)
MCHC: 33.9 g/dL (ref 30.0–36.0)
MCV: 95.9 fL (ref 80.0–100.0)
Monocytes Absolute: 0.3 10*3/uL (ref 0.1–1.0)
Monocytes Relative: 8 %
Neutro Abs: 1.2 10*3/uL — ABNORMAL LOW (ref 1.7–7.7)
Neutrophils Relative %: 30 %
Platelet Count: 124 10*3/uL — ABNORMAL LOW (ref 150–400)
RBC: 3.41 MIL/uL — ABNORMAL LOW (ref 3.87–5.11)
RDW: 14.1 % (ref 11.5–15.5)
WBC Count: 3.9 10*3/uL — ABNORMAL LOW (ref 4.0–10.5)
nRBC: 0 % (ref 0.0–0.2)

## 2023-01-06 NOTE — Telephone Encounter (Signed)
Telephone call to check on pre-operative status.  OR time, per [employer], is 12:30-4:30.Patient compliant with pre-operative instructions.  Reinforced nothing to eat after midnight. Clear liquids until 0930. Patient to arrive at 10:30. No questions or concerns voiced.  Instructed to call for any needs.

## 2023-01-06 NOTE — Progress Notes (Signed)
Confirmed via phone that Pt is aware of 1030 arrival in am at [facility] and NPO after 0930.

## 2023-01-06 NOTE — Telephone Encounter (Signed)
-----   Message from Carver Fila sent at 01/06/2023  3:39 PM EDT ----- Could one of you please call her and let her know doppler is negative for DVT? Thank you!

## 2023-01-06 NOTE — Telephone Encounter (Signed)
Pt aware of Doppler being negative for DVT

## 2023-01-06 NOTE — Anesthesia Preprocedure Evaluation (Addendum)
Anesthesia Evaluation  Patient identified by MRN, date of birth, ID band Patient awake    Reviewed: Allergy & Precautions, NPO status , Patient's Chart, lab work & pertinent test results  Airway Mallampati: II  TM Distance: >3 FB Neck ROM: Full    Dental no notable dental hx. (+) Missing, Partial Lower, Poor Dentition,    Pulmonary former smoker   Pulmonary exam normal breath sounds clear to auscultation       Cardiovascular hypertension, Pt. on medications Normal cardiovascular exam Rhythm:Regular Rate:Normal  10/23/2022 Echo 1. Left ventricular ejection fraction, by estimation, is 65 to 70%. Left  ventricular ejection fraction by 2D MOD biplane is 68.1 %. The left  ventricle has normal function. The left ventricle has no regional wall  motion abnormalities. There is mild left  ventricular hypertrophy. Left ventricular diastolic parameters are  consistent with Grade I diastolic dysfunction (impaired relaxation).   2. Right ventricular systolic function is normal. The right ventricular  size is normal. There is normal pulmonary artery systolic pressure. The  estimated right ventricular systolic pressure is 27.6 mmHg.   3. The mitral valve is abnormal. Trivial mitral valve regurgitation.   4. The aortic valve is tricuspid. Aortic valve regurgitation is not  visualized. Aortic valve sclerosis is present, with no evidence of aortic  valve stenosis.   5. The inferior vena cava is normal in size with greater than 50%  respiratory variability, suggesting right atrial pressure of 3 mmHg.     Neuro/Psych CVA (2019 balance), Residual Symptoms    GI/Hepatic negative GI ROS, Neg liver ROS,,,colitis   Endo/Other    Renal/GU Lab Results      Component                Value               Date                      NA                       140                 01/06/2023                CL                       106                  01/06/2023                K                        3.4 (L)             01/06/2023                CO2                      27                  01/06/2023                BUN                      14  01/06/2023                CREATININE               0.71                01/06/2023                GFRNONAA                 >60                 01/06/2023                CALCIUM                  10.1                01/06/2023                  Endometrial CA S?P Chemo    Musculoskeletal negative musculoskeletal ROS (+)    Abdominal  (+) + obese  Peds  Hematology Lab Results      Component                Value               Date                      WBC                      3.9 (L)             01/06/2023                HGB                      11.1 (L)            01/06/2023                HCT                      32.7 (L)            01/06/2023                MCV                      95.9                01/06/2023                PLT                      124 (L)             01/06/2023              Anesthesia Other Findings All: Zocor  Reproductive/Obstetrics                             Anesthesia Physical Anesthesia Plan  ASA: 3  Anesthesia Plan: General   Post-op Pain Management: Tylenol PO (pre-op)*   Induction: Intravenous  PONV Risk Score and Plan: Treatment may vary due to age or medical condition, Midazolam and Ondansetron  Airway Management Planned: Oral ETT  Additional Equipment: None  Intra-op Plan:   Post-operative Plan:  Extubation in OR  Informed Consent: I have reviewed the patients History and Physical, chart, labs and discussed the procedure including the risks, benefits and alternatives for the proposed anesthesia with the patient or authorized representative who has indicated his/her understanding and acceptance.     Dental advisory given  Plan Discussed with: CRNA  Anesthesia Plan Comments:          Anesthesia Quick Evaluation

## 2023-01-07 ENCOUNTER — Inpatient Hospital Stay (HOSPITAL_COMMUNITY)
Admission: RE | Admit: 2023-01-07 | Discharge: 2023-01-09 | DRG: 741 | Disposition: A | Discharge status: Home / Self Care | Payer: Medicare Other | Attending: Gynecologic Oncology | Admitting: Gynecologic Oncology

## 2023-01-07 ENCOUNTER — Inpatient Hospital Stay (HOSPITAL_COMMUNITY): Payer: Medicare Other | Admitting: Certified Registered"

## 2023-01-07 ENCOUNTER — Encounter (HOSPITAL_COMMUNITY)
Admission: RE | Disposition: A | Discharge status: Home / Self Care | Payer: Self-pay | Source: Home / Self Care | Attending: Gynecologic Oncology

## 2023-01-07 ENCOUNTER — Encounter (HOSPITAL_COMMUNITY): Payer: Self-pay | Admitting: Gynecologic Oncology

## 2023-01-07 ENCOUNTER — Inpatient Hospital Stay (HOSPITAL_COMMUNITY): Payer: Medicare Other | Admitting: Medical

## 2023-01-07 ENCOUNTER — Other Ambulatory Visit: Payer: Self-pay

## 2023-01-07 DIAGNOSIS — Z87891 Personal history of nicotine dependence: Secondary | ICD-10-CM

## 2023-01-07 DIAGNOSIS — Z6833 Body mass index (BMI) 33.0-33.9, adult: Secondary | ICD-10-CM

## 2023-01-07 DIAGNOSIS — C7989 Secondary malignant neoplasm of other specified sites: Secondary | ICD-10-CM

## 2023-01-07 DIAGNOSIS — C55 Malignant neoplasm of uterus, part unspecified: Secondary | ICD-10-CM | POA: Diagnosis not present

## 2023-01-07 DIAGNOSIS — E785 Hyperlipidemia, unspecified: Secondary | ICD-10-CM | POA: Diagnosis present

## 2023-01-07 DIAGNOSIS — I10 Essential (primary) hypertension: Secondary | ICD-10-CM | POA: Diagnosis present

## 2023-01-07 DIAGNOSIS — C7982 Secondary malignant neoplasm of genital organs: Secondary | ICD-10-CM

## 2023-01-07 DIAGNOSIS — G629 Polyneuropathy, unspecified: Secondary | ICD-10-CM | POA: Diagnosis present

## 2023-01-07 DIAGNOSIS — C541 Malignant neoplasm of endometrium: Principal | ICD-10-CM | POA: Diagnosis present

## 2023-01-07 DIAGNOSIS — E876 Hypokalemia: Secondary | ICD-10-CM | POA: Diagnosis present

## 2023-01-07 DIAGNOSIS — Z8049 Family history of malignant neoplasm of other genital organs: Secondary | ICD-10-CM

## 2023-01-07 DIAGNOSIS — Z7982 Long term (current) use of aspirin: Secondary | ICD-10-CM

## 2023-01-07 DIAGNOSIS — Z79899 Other long term (current) drug therapy: Secondary | ICD-10-CM

## 2023-01-07 DIAGNOSIS — E669 Obesity, unspecified: Secondary | ICD-10-CM | POA: Diagnosis present

## 2023-01-07 DIAGNOSIS — Z8673 Personal history of transient ischemic attack (TIA), and cerebral infarction without residual deficits: Secondary | ICD-10-CM

## 2023-01-07 DIAGNOSIS — Z86718 Personal history of other venous thrombosis and embolism: Secondary | ICD-10-CM

## 2023-01-07 SURGERY — XI ROBOTIC ASSISTED TOTAL HYSTERECTOMY BILATERAL SALPINGO OOPHORECTOMY WITH OMENTECTOMY AND DEBULKING
Anesthesia: General | Site: Abdomen

## 2023-01-07 MED ORDER — HYDROMORPHONE HCL 1 MG/ML IJ SOLN
INTRAMUSCULAR | Status: AC
  Administered 2023-01-07: 0.5 mg via INTRAVENOUS
  Filled 2023-01-07: qty 1

## 2023-01-07 MED ORDER — POVIDONE-IODINE 10 % EX SWAB: 2.0000 | Freq: Once | CUTANEOUS | Status: DC

## 2023-01-07 MED ORDER — PROPOFOL 10 MG/ML IV BOLUS
INTRAVENOUS | Status: AC
  Filled 2023-01-07: qty 20

## 2023-01-07 MED ORDER — ORAL CARE MOUTH RINSE: 15.0000 mL | Freq: Once | OROMUCOSAL | Status: AC

## 2023-01-07 MED ORDER — CEFAZOLIN SODIUM-DEXTROSE 2-4 GM/100ML-% IV SOLN
2.0000 g | INTRAVENOUS | Status: AC
  Administered 2023-01-07: 2 g via INTRAVENOUS
  Filled 2023-01-07: qty 100

## 2023-01-07 MED ORDER — PHENYLEPHRINE HCL-NACL 20-0.9 MG/250ML-% IV SOLN
INTRAVENOUS | Status: DC | PRN
Start: 2023-01-07 — End: 2023-01-07
  Administered 2023-01-07: 20 ug/min via INTRAVENOUS

## 2023-01-07 MED ORDER — EPHEDRINE 5 MG/ML INJ
INTRAVENOUS | Status: AC
  Filled 2023-01-07: qty 5

## 2023-01-07 MED ORDER — FENTANYL CITRATE (PF) 100 MCG/2ML IJ SOLN
INTRAMUSCULAR | Status: AC
  Filled 2023-01-07: qty 2

## 2023-01-07 MED ORDER — HEPARIN SODIUM (PORCINE) 5000 UNIT/ML IJ SOLN
5000.0000 [IU] | INTRAMUSCULAR | Status: AC
  Administered 2023-01-07: 5000 [IU] via SUBCUTANEOUS
  Filled 2023-01-07: qty 1

## 2023-01-07 MED ORDER — LACTATED RINGERS IV SOLN: INTRAVENOUS | Status: DC

## 2023-01-07 MED ORDER — HYDROMORPHONE HCL 1 MG/ML IJ SOLN
0.5000 mg | INTRAMUSCULAR | Status: DC | PRN
  Administered 2023-01-07: 0.5 mg via INTRAVENOUS
  Filled 2023-01-07 (×2): qty 0.5

## 2023-01-07 MED ORDER — ONDANSETRON HCL 4 MG PO TABS: 4.0000 mg | ORAL_TABLET | Freq: Four times a day (QID) | ORAL | Status: DC | PRN

## 2023-01-07 MED ORDER — ENOXAPARIN SODIUM 40 MG/0.4ML IJ SOSY
40.0000 mg | PREFILLED_SYRINGE | INTRAMUSCULAR | Status: DC
  Administered 2023-01-08: 40 mg via SUBCUTANEOUS
  Filled 2023-01-07: qty 0.4

## 2023-01-07 MED ORDER — ONDANSETRON HCL 4 MG/2ML IJ SOLN: 4.0000 mg | Freq: Four times a day (QID) | INTRAMUSCULAR | Status: DC | PRN

## 2023-01-07 MED ORDER — ONDANSETRON HCL 4 MG/2ML IJ SOLN
INTRAMUSCULAR | Status: DC | PRN
  Administered 2023-01-07: 4 mg via INTRAVENOUS

## 2023-01-07 MED ORDER — ONDANSETRON HCL 4 MG/2ML IJ SOLN
4.0000 mg | Freq: Once | INTRAMUSCULAR | Status: AC | PRN
  Administered 2023-01-07: 4 mg via INTRAVENOUS

## 2023-01-07 MED ORDER — OXYCODONE HCL 5 MG PO TABS
5.0000 mg | ORAL_TABLET | ORAL | Status: DC | PRN
  Administered 2023-01-07 – 2023-01-08 (×3): 5 mg via ORAL
  Filled 2023-01-07 (×3): qty 1

## 2023-01-07 MED ORDER — METHOCARBAMOL 1000 MG/10ML IJ SOLN
500.0000 mg | Freq: Four times a day (QID) | INTRAVENOUS | Status: DC | PRN
  Administered 2023-01-07: 500 mg via INTRAVENOUS
  Filled 2023-01-07: qty 500

## 2023-01-07 MED ORDER — SENNOSIDES-DOCUSATE SODIUM 8.6-50 MG PO TABS
2.0000 | ORAL_TABLET | Freq: Every day | ORAL | Status: DC
  Administered 2023-01-07 – 2023-01-08 (×2): 2 via ORAL
  Filled 2023-01-07 (×2): qty 2

## 2023-01-07 MED ORDER — IBUPROFEN 400 MG PO TABS
600.0000 mg | ORAL_TABLET | Freq: Four times a day (QID) | ORAL | Status: DC
  Administered 2023-01-08 – 2023-01-09 (×5): 600 mg via ORAL
  Filled 2023-01-07 (×5): qty 1

## 2023-01-07 MED ORDER — DEXAMETHASONE SODIUM PHOSPHATE 4 MG/ML IJ SOLN: 4.0000 mg | INTRAMUSCULAR | Status: DC

## 2023-01-07 MED ORDER — LACTATED RINGERS IV SOLN
INTRAVENOUS | Status: DC | PRN
Start: 2023-01-07 — End: 2023-01-07

## 2023-01-07 MED ORDER — ROCURONIUM BROMIDE 10 MG/ML (PF) SYRINGE
PREFILLED_SYRINGE | INTRAVENOUS | Status: DC | PRN
  Administered 2023-01-07 (×2): 20 mg via INTRAVENOUS
  Administered 2023-01-07: 50 mg via INTRAVENOUS
  Administered 2023-01-07: 20 mg via INTRAVENOUS

## 2023-01-07 MED ORDER — ACETAMINOPHEN 500 MG PO TABS
1000.0000 mg | ORAL_TABLET | Freq: Two times a day (BID) | ORAL | Status: DC
  Administered 2023-01-08 – 2023-01-09 (×3): 1000 mg via ORAL
  Filled 2023-01-07 (×3): qty 2

## 2023-01-07 MED ORDER — KCL IN DEXTROSE-NACL 20-5-0.45 MEQ/L-%-% IV SOLN
INTRAVENOUS | Status: DC
  Filled 2023-01-07 (×2): qty 1000

## 2023-01-07 MED ORDER — DEXAMETHASONE SODIUM PHOSPHATE 10 MG/ML IJ SOLN
INTRAMUSCULAR | Status: AC
  Filled 2023-01-07: qty 1

## 2023-01-07 MED ORDER — TRAMADOL HCL 50 MG PO TABS: 100.0000 mg | ORAL_TABLET | Freq: Two times a day (BID) | ORAL | Status: DC | PRN

## 2023-01-07 MED ORDER — ACETAMINOPHEN 500 MG PO TABS
1000.0000 mg | ORAL_TABLET | ORAL | Status: AC
  Administered 2023-01-07: 1000 mg via ORAL
  Filled 2023-01-07: qty 2

## 2023-01-07 MED ORDER — HEMOSTATIC AGENTS (NO CHARGE) OPTIME
TOPICAL | Status: DC | PRN
Start: 2023-01-07 — End: 2023-01-07
  Administered 2023-01-07: 1 via TOPICAL

## 2023-01-07 MED ORDER — HYDROMORPHONE HCL 1 MG/ML IJ SOLN
0.2500 mg | INTRAMUSCULAR | Status: DC | PRN
  Administered 2023-01-07 (×2): 0.5 mg via INTRAVENOUS

## 2023-01-07 MED ORDER — DEXAMETHASONE SODIUM PHOSPHATE 10 MG/ML IJ SOLN
INTRAMUSCULAR | Status: DC | PRN
  Administered 2023-01-07: 4 mg via INTRAVENOUS

## 2023-01-07 MED ORDER — OXYCODONE HCL 5 MG PO TABS
ORAL_TABLET | ORAL | Status: AC
  Administered 2023-01-07: 5 mg via ORAL
  Filled 2023-01-07: qty 1

## 2023-01-07 MED ORDER — KETOROLAC TROMETHAMINE 30 MG/ML IJ SOLN
INTRAMUSCULAR | Status: AC
  Administered 2023-01-07: 30 mg via INTRAVENOUS
  Filled 2023-01-07: qty 1

## 2023-01-07 MED ORDER — FENTANYL CITRATE (PF) 100 MCG/2ML IJ SOLN
INTRAMUSCULAR | Status: DC | PRN
  Administered 2023-01-07 (×4): 50 ug via INTRAVENOUS

## 2023-01-07 MED ORDER — KETAMINE HCL 50 MG/5ML IJ SOSY
PREFILLED_SYRINGE | INTRAMUSCULAR | Status: AC
  Filled 2023-01-07: qty 5

## 2023-01-07 MED ORDER — ONDANSETRON HCL 4 MG/2ML IJ SOLN
INTRAMUSCULAR | Status: AC
  Filled 2023-01-07: qty 2

## 2023-01-07 MED ORDER — BUPIVACAINE LIPOSOME 1.3 % IJ SUSP
INTRAMUSCULAR | Status: AC
  Filled 2023-01-07: qty 20

## 2023-01-07 MED ORDER — OXYCODONE HCL 5 MG/5ML PO SOLN: 5.0000 mg | Freq: Once | ORAL | Status: AC | PRN

## 2023-01-07 MED ORDER — LACTATED RINGERS IR SOLN
Status: DC | PRN
  Administered 2023-01-07: 1000 mL

## 2023-01-07 MED ORDER — ROCURONIUM BROMIDE 10 MG/ML (PF) SYRINGE
PREFILLED_SYRINGE | INTRAVENOUS | Status: AC
  Filled 2023-01-07: qty 10

## 2023-01-07 MED ORDER — PHENYLEPHRINE 80 MCG/ML (10ML) SYRINGE FOR IV PUSH (FOR BLOOD PRESSURE SUPPORT)
PREFILLED_SYRINGE | INTRAVENOUS | Status: DC | PRN
  Administered 2023-01-07 (×3): 80 ug via INTRAVENOUS

## 2023-01-07 MED ORDER — BUPIVACAINE HCL 0.25 % IJ SOLN
INTRAMUSCULAR | Status: DC | PRN
  Administered 2023-01-07: 32 mL

## 2023-01-07 MED ORDER — BUPIVACAINE LIPOSOME 1.3 % IJ SUSP
INTRAMUSCULAR | Status: DC | PRN
  Administered 2023-01-07: 38 mL

## 2023-01-07 MED ORDER — EZETIMIBE 10 MG PO TABS
10.0000 mg | ORAL_TABLET | Freq: Every day | ORAL | Status: DC
  Administered 2023-01-08 – 2023-01-09 (×2): 10 mg via ORAL
  Filled 2023-01-07 (×2): qty 1

## 2023-01-07 MED ORDER — BUPIVACAINE HCL 0.25 % IJ SOLN
INTRAMUSCULAR | Status: AC
  Filled 2023-01-07: qty 1

## 2023-01-07 MED ORDER — PROPOFOL 10 MG/ML IV BOLUS
INTRAVENOUS | Status: DC | PRN
  Administered 2023-01-07: 150 mg via INTRAVENOUS

## 2023-01-07 MED ORDER — LIDOCAINE 2% (20 MG/ML) 5 ML SYRINGE
INTRAMUSCULAR | Status: DC | PRN
  Administered 2023-01-07: 40 mg via INTRAVENOUS

## 2023-01-07 MED ORDER — OXYCODONE HCL 5 MG PO TABS: 5.0000 mg | ORAL_TABLET | Freq: Once | ORAL | Status: AC | PRN

## 2023-01-07 MED ORDER — STERILE WATER FOR IRRIGATION IR SOLN
Status: DC | PRN
  Administered 2023-01-07: 1000 mL

## 2023-01-07 MED ORDER — LABETALOL HCL 100 MG PO TABS: 200.0000 mg | ORAL_TABLET | Freq: Three times a day (TID) | ORAL | Status: DC | PRN

## 2023-01-07 MED ORDER — CHLORHEXIDINE GLUCONATE 0.12 % MT SOLN
15.0000 mL | Freq: Once | OROMUCOSAL | Status: AC
  Administered 2023-01-07: 15 mL via OROMUCOSAL

## 2023-01-07 MED ORDER — PHENYLEPHRINE 80 MCG/ML (10ML) SYRINGE FOR IV PUSH (FOR BLOOD PRESSURE SUPPORT)
PREFILLED_SYRINGE | INTRAVENOUS | Status: AC
  Filled 2023-01-07: qty 10

## 2023-01-07 MED ORDER — KETAMINE HCL 10 MG/ML IJ SOLN
INTRAMUSCULAR | Status: DC | PRN
  Administered 2023-01-07: 20 mg via INTRAVENOUS

## 2023-01-07 MED ORDER — KETOROLAC TROMETHAMINE 30 MG/ML IJ SOLN: 30.0000 mg | Freq: Once | INTRAMUSCULAR | Status: AC | PRN

## 2023-01-07 SURGICAL SUPPLY — 85 items
ADH SKN CLS APL DERMABOND .7 (GAUZE/BANDAGES/DRESSINGS) ×1
AGENT HMST KT MTR STRL THRMB (HEMOSTASIS)
APL ESCP 34 STRL LF DISP (HEMOSTASIS)
APL SRG 38 LTWT LNG FL B (MISCELLANEOUS) ×1
APPLICATOR ARISTA FLEXITIP XL (MISCELLANEOUS) IMPLANT
APPLICATOR SURGIFLO ENDO (HEMOSTASIS) IMPLANT
BAG COUNTER SPONGE SURGICOUNT (BAG) IMPLANT
BAG LAPAROSCOPIC 12 15 PORT 16 (BASKET) IMPLANT
BAG RETRIEVAL 12/15 (BASKET) ×1
BAG SPNG CNTER NS LX DISP (BAG)
BLADE SURG SZ10 CARB STEEL (BLADE) IMPLANT
COVER BACK TABLE 60X90IN (DRAPES) ×1 IMPLANT
COVER TIP SHEARS 8 DVNC (MISCELLANEOUS) ×1 IMPLANT
DERMABOND ADVANCED .7 DNX12 (GAUZE/BANDAGES/DRESSINGS) ×1 IMPLANT
DRAPE ARM DVNC X/XI (DISPOSABLE) ×4 IMPLANT
DRAPE COLUMN DVNC XI (DISPOSABLE) ×1 IMPLANT
DRAPE SHEET LG 3/4 BI-LAMINATE (DRAPES) ×1 IMPLANT
DRAPE SURG IRRIG POUCH 19X23 (DRAPES) ×1 IMPLANT
DRIVER NDL MEGA SUTCUT DVNCXI (INSTRUMENTS) ×1 IMPLANT
DRIVER NDLE MEGA SUTCUT DVNCXI (INSTRUMENTS) ×1
DRSG OPSITE POSTOP 4X6 (GAUZE/BANDAGES/DRESSINGS) IMPLANT
DRSG OPSITE POSTOP 4X8 (GAUZE/BANDAGES/DRESSINGS) IMPLANT
ELECT PENCIL ROCKER SW 15FT (MISCELLANEOUS) IMPLANT
ELECT REM PT RETURN 15FT ADLT (MISCELLANEOUS) ×1 IMPLANT
FORCEPS BPLR FENES DVNC XI (FORCEP) ×1 IMPLANT
FORCEPS PROGRASP DVNC XI (FORCEP) ×1 IMPLANT
GAUZE 4X4 16PLY ~~LOC~~+RFID DBL (SPONGE) ×2 IMPLANT
GLOVE BIO SURGEON STRL SZ 6 (GLOVE) ×4 IMPLANT
GLOVE BIO SURGEON STRL SZ 6.5 (GLOVE) ×1 IMPLANT
GOWN STRL REUS W/ TWL LRG LVL3 (GOWN DISPOSABLE) ×4 IMPLANT
GOWN STRL REUS W/TWL LRG LVL3 (GOWN DISPOSABLE) ×4
GRASPER SUT TROCAR 14GX15 (MISCELLANEOUS) IMPLANT
HEMOSTAT ARISTA ABSORB 3G PWDR (HEMOSTASIS) IMPLANT
HOLDER FOLEY CATH W/STRAP (MISCELLANEOUS) IMPLANT
IRRIG SUCT STRYKERFLOW 2 WTIP (MISCELLANEOUS) ×1
IRRIGATION SUCT STRKRFLW 2 WTP (MISCELLANEOUS) ×1 IMPLANT
KIT PROCEDURE DVNC SI (MISCELLANEOUS) IMPLANT
KIT TURNOVER KIT A (KITS) IMPLANT
LIGASURE IMPACT 36 18CM CVD LR (INSTRUMENTS) IMPLANT
MANIPULATOR ADVINCU DEL 3.0 PL (MISCELLANEOUS) IMPLANT
MANIPULATOR ADVINCU DEL 3.5 PL (MISCELLANEOUS) IMPLANT
MANIPULATOR UTERINE 4.5 ZUMI (MISCELLANEOUS) IMPLANT
NDL HYPO 21X1.5 SAFETY (NEEDLE) ×1 IMPLANT
NDL SPNL 18GX3.5 QUINCKE PK (NEEDLE) IMPLANT
NEEDLE HYPO 21X1.5 SAFETY (NEEDLE) ×1
NEEDLE SPNL 18GX3.5 QUINCKE PK (NEEDLE)
OBTURATOR OPTICAL STND 8 DVNC (TROCAR) ×1
OBTURATOR OPTICALSTD 8 DVNC (TROCAR) ×1 IMPLANT
PACK ROBOT GYN CUSTOM WL (TRAY / TRAY PROCEDURE) ×1 IMPLANT
PAD POSITIONING PINK XL (MISCELLANEOUS) ×1 IMPLANT
PORT ACCESS TROCAR AIRSEAL 12 (TROCAR) IMPLANT
SCISSORS MNPLR CVD DVNC XI (INSTRUMENTS) ×1 IMPLANT
SCRUB CHG 4% DYNA-HEX 4OZ (MISCELLANEOUS) IMPLANT
SEAL UNIV 5-12 XI (MISCELLANEOUS) ×4 IMPLANT
SET TRI-LUMEN FLTR TB AIRSEAL (TUBING) ×1 IMPLANT
SOL ELECTROSURG ANTI STICK (MISCELLANEOUS) ×1
SOLUTION ELECTROSURG ANTI STCK (MISCELLANEOUS) IMPLANT
SPIKE FLUID TRANSFER (MISCELLANEOUS) ×1 IMPLANT
SPONGE T-LAP 18X18 ~~LOC~~+RFID (SPONGE) IMPLANT
SURGIFLO W/THROMBIN 8M KIT (HEMOSTASIS) IMPLANT
SUT MNCRL AB 4-0 PS2 18 (SUTURE) IMPLANT
SUT PDS AB 1 TP1 96 (SUTURE) IMPLANT
SUT V-LOC 180 0-0 GS22 (SUTURE) IMPLANT
SUT VIC AB 0 CT1 27 (SUTURE)
SUT VIC AB 0 CT1 27XBRD ANTBC (SUTURE) IMPLANT
SUT VIC AB 2-0 CT1 27 (SUTURE)
SUT VIC AB 2-0 CT1 TAPERPNT 27 (SUTURE) IMPLANT
SUT VIC AB 2-0 SH 27 (SUTURE) ×2
SUT VIC AB 2-0 SH 27X BRD (SUTURE) IMPLANT
SUT VIC AB 4-0 PS2 18 (SUTURE) ×2 IMPLANT
SUT VICRYL 0 27 CT2 27 ABS (SUTURE) ×1 IMPLANT
SUT VLOC 180 0 9IN GS21 (SUTURE) IMPLANT
SYR 10ML LL (SYRINGE) IMPLANT
SYR BULB IRRIG 60ML STRL (SYRINGE) IMPLANT
SYS BAG RETRIEVAL 10MM (BASKET)
SYS WOUND ALEXIS 18CM MED (MISCELLANEOUS) ×1
SYSTEM BAG RETRIEVAL 10MM (BASKET) IMPLANT
SYSTEM WOUND ALEXIS 18CM MED (MISCELLANEOUS) IMPLANT
TOWEL OR NON WOVEN STRL DISP B (DISPOSABLE) IMPLANT
TRAP SPECIMEN MUCUS 40CC (MISCELLANEOUS) IMPLANT
TRAY FOLEY MTR SLVR 16FR STAT (SET/KITS/TRAYS/PACK) ×1 IMPLANT
TROCAR PORT AIRSEAL 5X120 (TROCAR) IMPLANT
UNDERPAD 30X36 HEAVY ABSORB (UNDERPADS AND DIAPERS) ×2 IMPLANT
WATER STERILE IRR 1000ML POUR (IV SOLUTION) ×1 IMPLANT
YANKAUER SUCT BULB TIP 10FT TU (MISCELLANEOUS) IMPLANT

## 2023-01-07 NOTE — Interval H&P Note (Signed)
History and Physical Interval Note:  01/07/2023 11:14 AM  Maria Avila  has presented today for surgery, with the diagnosis of ADVANCED UTERINE CANCER.  The various methods of treatment have been discussed with the patient and family. After consideration of risks, benefits and other options for treatment, the patient has consented to  Procedure(s): DIAGNOSTIC LAPAROSCOPY, XI ROBOTIC ASSISTED VERSUS OPEN TOTAL HYSTERECTOMY BILATERAL SALPINGO OOPHORECTOMY WITH OMENTECTOMY AND DEBULKING (N/A) as a surgical intervention.  The patient's history has been reviewed, patient examined, no change in status, stable for surgery.  I have reviewed the patient's chart and labs.  Questions were answered to the patient's satisfaction.     Carver Fila

## 2023-01-07 NOTE — Op Note (Signed)
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   Anesthesia: GET  Urine Output: 200 cc  Operative Findings: On EUA, somewhat mobile small uterus.  On intra-abdominal entry, normal upper abdominal survey.  Omentum adherent to the sigmoid epiploica and the descending colon.  5 mm tumor implant along the left pelvic brim.  Normal-appearing bilateral tubes and ovaries although the sigmoid colon and epiploica adherent to the medial aspect of the left adnexa and posterior uterus.  Miliary disease noted over the anterior aspect of the sigmoid colon, rectum, and posterior cul-de-sac peritoneum.  Some obliteration of the deep posterior cul-de-sac.  On rectal exam, approximately 7-8 cm above the anal verge, just superior to the cervicovaginal junction, there is some tethering of the rectum.  Some adhesions between the bladder and lower uterine segment.  Minimal pelvic ascites.  Medical comorbidities as well as what would have required a very low rectal resection and anastomosis to achieve complete cytoreduction with the need for a diverting ileostomy, decision made to remove the bulk of disease without bowel surgery.  Much of the pelvic peritoneum with miliary disease stripped within the cul-de-sac.  Estimated Blood Loss:  125 cc      Total IV Fluids: see I&O flowsheet         Specimens: uterus, cervix, bilateral tubes and ovaries, left pelvic sidewall nodule,  posterior cul-de-sac peritoneum, omentum         Complications:  None apparent; patient tolerated the procedure well.         Disposition: PACU - hemodynamically stable.  Procedure Details  The patient was seen in the Holding Room. The risks, benefits, complications, treatment options, and expected outcomes were discussed with the patient.  The patient concurred with the proposed plan, giving informed consent.  The site of surgery properly noted/marked. The patient was identified as Maria Avila and the procedure verified as a Robotic-assisted hysterectomy with bilateral salpingo oophorectomy, tumor debulking.   After induction of anesthesia, the patient was draped and prepped in the usual sterile manner. Patient was placed in supine position after anesthesia and draped and prepped in the usual sterile manner as follows: Her arms were tucked to her side with all appropriate precautions.  The patient was secured to the bed using padding and tape across her chest.  The patient was placed in the semi-lithotomy position in [place] stirrups.  The perineum and vagina were prepped with CHG. The patient's abdomen was prepped with ChloraPrep and then she was draped after the prep had been allowed to dry for 3 minutes.  A Time Out was held and the above information confirmed.  The urethra was prepped with Betadine. Foley catheter was placed.  A sterile speculum was placed in the vagina.  The cervix was grasped with a single-tooth tenaculum. The cervix was dilated with Shawnie Pons dilators.  The ZUMI uterine manipulator with a medium colpotomizer ring was placed without difficulty.  A pneum occluder balloon was placed over the manipulator.  OG tube placement was confirmed and to suction.   Next, a 10 mm skin incision was made 1 cm  below the subcostal margin in the midclavicular line.  The 5 mm Optiview port and scope was used for direct entry.  Opening pressure was under 10 mm CO2.  The abdomen was insufflated and the  findings were noted as above.   At this point and all points during the procedure, the patient's intra-abdominal pressure did not exceed 15 mmHg. Next, an 8 mm skin incision was made superior to the umbilicus and a right and left port were placed about 8 cm lateral to the robot port on the right and left side.  A fourth arm was placed on the right.  The 5 mm assist trocar was exchanged for a 10-12 mm port. All ports were placed under direct visualization.  The patient was placed in steep Trendelenburg.  The robot was docked in the normal manner.  The pelvis was inspected.  The sigmoid epiploica were noted to be adherent to the posterior left uterus and left adnexa.  Attention was initially turned to the right.  The right peritoneum was opened parallel to the IP ligament to open the retroperitoneal space.  The round ligament was transected.  The ureter was noted to be on the medial leaf of the broad ligament.  The peritoneum above the ureter was incised.  The infundibulopelvic ligament was skeletonized, cauterized, and cut.  Attention was then turned to the left.  The left round ligament was inspected and the left peritoneum was opened parallel to the IP ligament.  The tumor implant versus treated tumor along the left pelvic brim was excised and handed off the field.  The left ureter was ultimately noted to be deep in the pelvis along the medial leaf of the broad ligament.  Attention was then turned to mobilizing the sigmoid and sigmoid epiploica from the left adnexa and uterus.  This was done with a combination of blunt dissection and sharp dissection until ultimately freed.  The peritoneum above the ureter was then incised along the medial leaf of the broad ligament and the infundibulopelvic ligament was skeletonized, cauterized, and cut.  Attention was turned back to the sigmoid colon.  Small, less than 5 mm implants were noted on portions of the sigmoid colon down to the rectum.  The cul-de-sac was  somewhat obliterated with some miliary disease over the peritoneum of the cul-de-sac.  Given patient's medical comorbidities, evidence of diverticular disease, and what would need to be a low rectal resection with 3 anastomosis to remove all of the miliary disease on the sigmoid and rectum, decision made not to proceed with an LAR but rather to cauterize the areas of sigmoid epiploica (away from any bowel wall) that had implants and to strip some of the posterior pelvic peritoneum.  The posterior peritoneum was taken down to the level of the KOH ring.  The rectovaginal septum was developed.  The peritoneum was developed within the cul-de-sac on the right after again identifying the ureter deep within the pelvis.  Gentle traction was used to mobilize the peritoneum and accommodation of blunt dissection and short burst of electrocautery were used to excise the an area of peritoneum with the majority of miliary disease.  During this dissection, an EEA sizer was placed in the rectum to show the course of the rectum.  Once excised, the peritoneum was handed out to the assist trocar.  The anterior peritoneum was also taken down.  The bladder flap was created to the level of the KOH ring.  The uterine artery on the right side was  skeletonized, cauterized and cut in the normal manner.  A similar procedure was performed on the left.  The colpotomy was made and the uterus, cervix, bilateral ovaries and tubes were amputated and placed within an Endo Catch bag inserted through the assist trocar.  Pedicles were inspected and excellent hemostasis was achieved.    Then turned to the right pelvic sidewall.  The pararectal and paravesical spaces were opened.  The lymphatic tissue was examined and no obvious adenopathy noted.  The colpotomy at the vaginal cuff was closed with 0 Vicryl on a CT1 needle with a figure-of-eight at each apex and 0 V-Loc to close the midportion of the cuff in a running manner.  Irrigation was used  and excellent hemostasis was achieved.    One robotic instrument was removed and table motion was performed until the patient was at 10 degrees of Trendelenburg.  Robotic instrument was used to put pressure at the superior aspect of the sigmoid colon and the pelvis was filled with sterile fluid.  I then performed a rectal exam and then placed a proctoscope approximately 6 cm into the rectum.  Bubble test was then performed and negative.  The fluid was all removed from the pelvis and Arista was placed over the surgical bed.  All instruments were removed and the robot was undocked.  Patient was flattened.  The fascia at the 12 mm left upper quadrant port was closed with 0 Vicryl using a PMI fascial closure device under direct visualization.  The supraumbilical trocar was removed and the incision extended 10-12 cm with a scalpel. The incision was carried down to and through the fascia, with the abdomen insufflated, using monopolar electrocautery. The peritoneal incision was extended under direct visualization. The endocatch bag with the uterine specimen was delivered through the incision.   An Alexis retractor was then placed.  The transverse colon was delivered through the incision and an infracolic omentectomy was performed.  Along the left aspect of the omentum, there was some nodularity in the omentum itself appeared to be adherent to the descending colon versus sigmoid colon site of a diverticula.  The LigaSure device was used to ultimately I's and transect the omentum medial to its attachment to the colon.  2 figure-of-eight's using 2-0 Vicryl was used to reinforce this area and assured hemostasis.  The omentum was then handed off the field.  The incision was then closed with running #1 looped PDS tied in the midline. The subcutaneous tissue was irrigated and hemostasis achieved. Exparel was injected for local anesthesia. The subcutaneous tissue was closed with 2-0 Vicyrl in running fashion.   The  subcuticular tissue of all incisions was closed with 4-0 Vicryl and the skin was closed with 4-0 Monocryl in a subcuticular manner.  Dermabond was applied.    The vagina was swabbed with  minimal bleeding noted. Foley catheter was left in place.  All sponge, lap and needle counts were correct x 3.   The patient was transferred to the recovery room in stable condition.  Eugene Garnet, MD

## 2023-01-07 NOTE — Transfer of Care (Signed)
Immediate Anesthesia Transfer of Care Note  Patient: Maria Avila  Procedure(s) Performed: DIAGNOSTIC LAPAROSCOPY, XI ROBOTIC ASSISTED TOTAL HYSTERECTOMY BILATERAL SALPINGO OOPHORECTOMY WITH OMENTECTOMY AND DEBULKING WITH MINI LAPAROTOMY (Abdomen)  Patient Location: PACU  Anesthesia Type:General  Level of Consciousness: awake and alert   Airway & Oxygen Therapy: Patient Spontanous Breathing and Patient connected to face mask oxygen  Post-op Assessment: Report given to RN and Post -op Vital signs reviewed and stable  Post vital signs: Reviewed and stable  Last Vitals:  Vitals Value Taken Time  BP    Temp    Pulse 77 01/07/23 1638  Resp    SpO2 100 % 01/07/23 1638  Vitals shown include unfiled device data.  Last Pain:  Vitals:   01/07/23 1136  TempSrc: Oral  PainSc:          Complications: No notable events documented.

## 2023-01-07 NOTE — Anesthesia Procedure Notes (Signed)
Procedure Name: Intubation Date/Time: 01/07/2023 12:53 PM  Performed by: Sindy Guadeloupe, CRNAPre-anesthesia Checklist: Patient identified, Emergency Drugs available, Suction available, Patient being monitored and Timeout performed Patient Re-evaluated:Patient Re-evaluated prior to induction Oxygen Delivery Method: Circle system utilized Preoxygenation: Pre-oxygenation with 100% oxygen Induction Type: IV induction Ventilation: Mask ventilation without difficulty Laryngoscope Size: Mac and 4 Grade View: Grade I Tube type: Oral Tube size: 7.0 mm Number of attempts: 1 Airway Equipment and Method: Stylet Placement Confirmation: ETT inserted through vocal cords under direct vision, positive ETCO2 and breath sounds checked- equal and bilateral Secured at: 22 cm Tube secured with: Tape Dental Injury: Teeth and Oropharynx as per pre-operative assessment

## 2023-01-07 NOTE — Brief Op Note (Signed)
01/07/2023  4:32 PM  PATIENT:  Maria Avila  72 y.o. female  PRE-OPERATIVE DIAGNOSIS:  ADVANCED UTERINE CANCER  POST-OPERATIVE DIAGNOSIS:  ADVANCED UTERINE CANCER  PROCEDURE:  Procedure(s): DIAGNOSTIC LAPAROSCOPY, XI ROBOTIC ASSISTED TOTAL HYSTERECTOMY BILATERAL SALPINGO OOPHORECTOMY WITH OMENTECTOMY AND DEBULKING (N/A)  SURGEON:  Surgeons and Role:    Carver Fila, MD - Primary    Antionette Char, MD - Assisting  ANESTHESIA:   general  EBL:  125 mL   BLOOD ADMINISTERED:none  DRAINS: none   LOCAL MEDICATIONS USED:  exparel, 0.25% marcaine  SPECIMEN:  uterus, cervix, bilateral tubes and ovaries, posterior cul de sac, left pelvic side wall nodule, omentum  DISPOSITION OF SPECIMEN:  PATHOLOGY  COUNTS:  YES  TOURNIQUET:  * No tourniquets in log *  DICTATION: .Note written in EPIC  PLAN OF CARE: Admit for overnight observation  PATIENT DISPOSITION:  PACU - hemodynamically stable.   Delay start of Pharmacological VTE agent (>24hrs) due to surgical blood loss or risk of bleeding: no

## 2023-01-07 NOTE — Anesthesia Postprocedure Evaluation (Signed)
Anesthesia Post Note  Patient: Maria Avila  Procedure(s) Performed: DIAGNOSTIC LAPAROSCOPY, XI ROBOTIC ASSISTED TOTAL HYSTERECTOMY BILATERAL SALPINGO OOPHORECTOMY WITH OMENTECTOMY AND DEBULKING WITH MINI LAPAROTOMY (Abdomen)     Patient location during evaluation: PACU Anesthesia Type: General Level of consciousness: awake and alert Pain management: pain level controlled Vital Signs Assessment: post-procedure vital signs reviewed and stable Respiratory status: spontaneous breathing, nonlabored ventilation and respiratory function stable Cardiovascular status: blood pressure returned to baseline and stable Postop Assessment: no apparent nausea or vomiting Anesthetic complications: no   No notable events documented.  Last Vitals:  Vitals:   01/07/23 1730 01/07/23 1755  BP: 128/69 105/66  Pulse: (!) 55 (!) 53  Resp: 14 15  Temp:  36.5 C  SpO2: 100% 99%    Last Pain:  Vitals:   01/07/23 1755  TempSrc: Oral  PainSc: 8                  Lowella Curb

## 2023-01-08 DIAGNOSIS — E669 Obesity, unspecified: Secondary | ICD-10-CM | POA: Diagnosis present

## 2023-01-08 DIAGNOSIS — Z6833 Body mass index (BMI) 33.0-33.9, adult: Secondary | ICD-10-CM | POA: Diagnosis not present

## 2023-01-08 DIAGNOSIS — E876 Hypokalemia: Secondary | ICD-10-CM | POA: Diagnosis present

## 2023-01-08 DIAGNOSIS — Z87891 Personal history of nicotine dependence: Secondary | ICD-10-CM | POA: Diagnosis not present

## 2023-01-08 DIAGNOSIS — C55 Malignant neoplasm of uterus, part unspecified: Secondary | ICD-10-CM | POA: Diagnosis present

## 2023-01-08 DIAGNOSIS — Z8049 Family history of malignant neoplasm of other genital organs: Secondary | ICD-10-CM | POA: Diagnosis not present

## 2023-01-08 DIAGNOSIS — G629 Polyneuropathy, unspecified: Secondary | ICD-10-CM | POA: Diagnosis present

## 2023-01-08 DIAGNOSIS — Z7982 Long term (current) use of aspirin: Secondary | ICD-10-CM | POA: Diagnosis not present

## 2023-01-08 DIAGNOSIS — Z79899 Other long term (current) drug therapy: Secondary | ICD-10-CM | POA: Diagnosis not present

## 2023-01-08 DIAGNOSIS — Z8673 Personal history of transient ischemic attack (TIA), and cerebral infarction without residual deficits: Secondary | ICD-10-CM | POA: Diagnosis not present

## 2023-01-08 DIAGNOSIS — C541 Malignant neoplasm of endometrium: Secondary | ICD-10-CM | POA: Diagnosis present

## 2023-01-08 DIAGNOSIS — I10 Essential (primary) hypertension: Secondary | ICD-10-CM | POA: Diagnosis present

## 2023-01-08 DIAGNOSIS — E785 Hyperlipidemia, unspecified: Secondary | ICD-10-CM | POA: Diagnosis present

## 2023-01-08 DIAGNOSIS — Z86718 Personal history of other venous thrombosis and embolism: Secondary | ICD-10-CM | POA: Diagnosis not present

## 2023-01-08 LAB — CBC
HCT: 29.2 % — ABNORMAL LOW (ref 36.0–46.0)
Hemoglobin: 9.8 g/dL — ABNORMAL LOW (ref 12.0–15.0)
MCH: 33 pg (ref 26.0–34.0)
MCHC: 33.6 g/dL (ref 30.0–36.0)
MCV: 98.3 fL (ref 80.0–100.0)
Platelets: 123 10*3/uL — ABNORMAL LOW (ref 150–400)
RBC: 2.97 MIL/uL — ABNORMAL LOW (ref 3.87–5.11)
RDW: 14.5 % (ref 11.5–15.5)
WBC: 5 10*3/uL (ref 4.0–10.5)
nRBC: 0 % (ref 0.0–0.2)

## 2023-01-08 LAB — BASIC METABOLIC PANEL
Anion gap: 9 (ref 5–15)
BUN: 9 mg/dL (ref 8–23)
CO2: 25 mmol/L (ref 22–32)
Calcium: 8.9 mg/dL (ref 8.9–10.3)
Chloride: 103 mmol/L (ref 98–111)
Creatinine, Ser: 0.73 mg/dL (ref 0.44–1.00)
GFR, Estimated: >60 mL/min (ref >60)
Glucose, Bld: 143 mg/dL — ABNORMAL HIGH (ref 70–99)
Potassium: 3.7 mmol/L (ref 3.5–5.1)
Sodium: 137 mmol/L (ref 135–145)

## 2023-01-08 NOTE — Progress Notes (Signed)
   01/08/23 1449  TOC Brief Assessment  Insurance and Status Reviewed  Patient has primary care physician Yes  Home environment has been reviewed home wtih spouse  Prior level of function: independent  Prior/Current Home Services No current home services  Social Determinants of Health Reivew SDOH reviewed no interventions necessary  Readmission risk has been reviewed Yes  Transition of care needs no transition of care needs at this time

## 2023-01-08 NOTE — Progress Notes (Signed)
Mobility Specialist - Progress Note   01/08/23 0941  Mobility  Activity Ambulated with assistance in hallway  Level of Assistance Standby assist, set-up cues, supervision of patient - no hands on  Assistive Device Front wheel walker  Distance Ambulated (ft) 250 ft  Activity Response Tolerated well  Mobility Referral Yes  $Mobility charge 1 Mobility  Mobility Specialist Start Time (ACUTE ONLY) [id number]  Mobility Specialist Stop Time (ACUTE ONLY) 0940  Mobility Specialist Time Calculation (min) (ACUTE ONLY) 13 min   Pt received in bed and agreeable to mobility. No complaints during session. Pt to bed after session with all needs met.    [facility]

## 2023-01-08 NOTE — Progress Notes (Signed)
1 Day Post-Op Procedure(s) (LRB): DIAGNOSTIC LAPAROSCOPY, XI ROBOTIC ASSISTED TOTAL HYSTERECTOMY BILATERAL SALPINGO OOPHORECTOMY WITH OMENTECTOMY AND DEBULKING WITH MINI LAPAROTOMY (N/A)  Subjective: Patient reports feeling better this am. Having abdominal soreness. Passing flatus. No nausea or emesis. Has not had solid food. Ambulated with assist. Due to void after foley removal.  Objective: Vital signs in last 24 hours: Temp:  [97.3 F (36.3 C)-97.9 F (36.6 C)] 97.6 F (36.4 C) (09/05 0612) Pulse Rate:  [53-77] 59 (09/05 0612) Resp:  [11-21] 16 (09/05 0612) BP: (102-155)/(58-92) 111/67 (09/05 0612) SpO2:  [98 %-100 %] 100 % (09/05 0612) Weight:  [185 lb (83.9 kg)] 185 lb (83.9 kg) (09/04 1113) Last BM Date : 01/07/23  Intake/Output from previous day: 09/04 0701 - 09/05 0700 In: 3706 [P.O.:570; I.V.:3036; IV Piggyback:100] Out: 1775 [Urine:1650; Blood:125]  Physical Examination: General: alert, cooperative, and no distress Resp: clear to auscultation bilaterally Cardio: regular rate and rhythm, S1, S2 normal, no murmur, click, rub or gallop GI: incision: abdominal incisions intact with no active drainage or bleeding and abdomen soft, active bowel sounds Extremities: extremities normal, atraumatic, no cyanosis or edema  Labs: WBC/Hgb/Hct/Plts:  5.0/9.8/29.2/123 (09/05 0426) BUN/Cr/glu/ALT/AST/amyl/lip:  9/0.73/--/--/--/--/-- (09/05 0426)  Assessment: 72 y.o. s/p Procedure(s): DIAGNOSTIC LAPAROSCOPY, XI ROBOTIC ASSISTED TOTAL HYSTERECTOMY BILATERAL SALPINGO OOPHORECTOMY WITH OMENTECTOMY AND DEBULKING WITH MINI LAPAROTOMY: stable Pain:  Pain is well-controlled on PRN medications.  Heme:Hgb 9.8 and Hct 29.2  ID: WBC 5.0 this am. Given intra-op abxs and decadron.  CV: BP and HR stable. Continue to monitor.  GI:  Tolerating po: yes. Diet as tolerated  GU: Due to void. Creatinine 0.73 this am. Adequate output reported.    FEN: No critical values on am Bmet.  Prophylaxis:  SCDs and lovenox ordered  Plan: Diet as tolerated Encourage increasing mobility with assist If tolerating diet, can saline lock IV this afternoon Plan for discharge home as soon as tomorrow if patient meeting milestones.   LOS: 1 day    Doylene Bode 01/08/2023, 7:28 AM

## 2023-01-08 NOTE — Plan of Care (Signed)
  Problem: Clinical Measurements: Goal: Respiratory complications will improve Outcome: Progressing   

## 2023-01-09 ENCOUNTER — Encounter: Payer: Self-pay | Admitting: Hematology and Oncology

## 2023-01-09 ENCOUNTER — Other Ambulatory Visit (HOSPITAL_COMMUNITY): Payer: Self-pay

## 2023-01-09 MED ORDER — APIXABAN 2.5 MG PO TABS
2.5000 mg | ORAL_TABLET | Freq: Two times a day (BID) | ORAL | 0 refills | Status: DC
Start: 2023-01-10 — End: 2023-03-19
  Filled 2023-01-09: qty 24, 12d supply, fill #0

## 2023-01-09 MED ORDER — SENNOSIDES-DOCUSATE SODIUM 8.6-50 MG PO TABS
2.0000 | ORAL_TABLET | Freq: Every day | ORAL | 0 refills | Status: DC
  Filled 2023-01-09: qty 60, 30d supply, fill #0

## 2023-01-09 NOTE — Progress Notes (Signed)
Reviewed written d/c instructions w pt and all questions answered. Reviewed Eliquis instructions w pt and her husband, they both verbalized understanding of this as well. D/C via w/c w all belongings in stable condition.

## 2023-01-09 NOTE — Discharge Summary (Signed)
Physician Discharge Summary  Patient ID: Maria Avila MRN: [id number] DOB/AGE: 1951-01-05 72 y.o.  Admit date: 01/07/2023 Discharge date: 01/09/2023  Admission Diagnoses: Uterine cancer [facility])  Discharge Diagnoses:  Principal Problem:   Uterine cancer [name]) Active Problems:   Endometrial cancer (HCC)   Endometrial ca [facility])   Discharged Condition:  The patient is in good condition and stable for discharge.    Hospital Course: On 01/07/2023, the patient underwent the following: Procedure(s): DIAGNOSTIC LAPAROSCOPY, XI ROBOTIC ASSISTED TOTAL HYSTERECTOMY BILATERAL SALPINGO OOPHORECTOMY WITH OMENTECTOMY AND DEBULKING WITH MINI LAPAROTOMY. The postoperative course was uneventful.  She was discharged to home on postoperative day 2 tolerating a regular diet, ambulating, voiding, passing flatus, pain minimal.   Consults: None  Significant Diagnostic Studies: Labs  Treatments: Surgery: see above  Discharge Exam: Blood pressure 129/71, pulse 63, temperature 97.7 F (36.5 C), temperature source Oral, resp. rate 18, height 5\' 2"  (1.575 m), weight 185 lb (83.9 kg), SpO2 100%. General: alert, cooperative, and no distress Resp: clear to auscultation bilaterally Cardio: regular rate and rhythm, S1, S2 normal, no murmur, click, rub or gallop GI: incision: abdominal incisions intact with no active drainage or bleeding, op site intact and abdomen soft, active bowel sounds Extremities: extremities normal, atraumatic, no cyanosis or edema  Disposition: Discharge disposition: 01-Home or Self Care       Discharge Instructions     Call MD for:  difficulty breathing, headache or visual disturbances   Complete by: As directed    Call MD for:  extreme fatigue   Complete by: As directed    Call MD for:  hives   Complete by: As directed    Call MD for:  persistant dizziness or light-headedness   Complete by: As directed    Call MD for:  persistant nausea and vomiting   Complete by: As directed     Call MD for:  redness, tenderness, or signs of infection (pain, swelling, redness, odor or green/yellow discharge around incision site)   Complete by: As directed    Call MD for:  severe uncontrolled pain   Complete by: As directed    Call MD for:  temperature >100.4   Complete by: As directed    Diet - low sodium heart healthy   Complete by: As directed    Discharge wound care:   Complete by: As directed    You will have a white honeycomb dressing over your larger incision. This dressing can be removed 5 days after surgery and you do not need to reapply a new dressing. Once you remove the dressing, you will notice that you have the surgical glue (dermabond) on the incision and this will peel off on its own. You can get this dressing wet in the shower the days after surgery prior to removal on the 5th day.   Driving Restrictions   Complete by: As directed    No driving for 1 week(s).  Do not take narcotics and drive. You need to make sure your reaction time has returned.   Increase activity slowly   Complete by: As directed    Lifting restrictions   Complete by: As directed    No lifting greater than 10 lbs, pushing, pulling, straining for 6 weeks.   Sexual Activity Restrictions   Complete by: As directed    No sexual activity, nothing in the vagina, for 8-10 weeks.      Allergies as of 01/09/2023       Reactions   Crestor [  rosuvastatin Calcium] Swelling, Other (See Comments)   Swelling of legs and muscle weakness   Zocor [simvastatin] Swelling, Other (See Comments)   Swelling of legs and muscle weakness        Medication List     STOP taking these medications    aspirin EC 81 MG tablet   bisacodyl 5 MG EC tablet Commonly known as: DULCOLAX   metroNIDAZOLE 500 MG tablet Commonly known as: FLAGYL   polyethylene glycol powder 17 GM/SCOOP powder Commonly known as: MiraLax       TAKE these medications    acetaminophen 325 MG tablet Commonly known as:  TYLENOL Take 1-2 tablets (325-650 mg total) by mouth every 4 (four) hours as needed for mild pain.   apixaban 2.5 MG Tabs tablet Commonly known as: Eliquis Take 1 tablet (2.5 mg total) by mouth 2 (two) times daily. Plan to start 01/10/23 Start taking on: January 10, 2023   benazepril-hydrochlorthiazide 20-25 MG tablet Commonly known as: LOTENSIN HCT Take 1 tablet by mouth daily.   [facility] Vitamin C 500 MG tablet Generic drug: ascorbic acid TAKE 1 TABLET BY MOUTH TWICE A DAY   ezetimibe 10 MG tablet Commonly known as: ZETIA Take 10 mg by mouth daily.   lidocaine-prilocaine cream Commonly known as: EMLA Apply to affected area once What changed:  how much to take how to take this when to take this reasons to take this additional instructions   multivitamin capsule Take 1 capsule by mouth daily.   ondansetron 8 MG tablet Commonly known as: ZOFRAN Take 1 tablet (8 mg total) by mouth every 8 (eight) hours as needed for nausea.   oxyCODONE 5 MG immediate release tablet Commonly known as: Oxy IR/ROXICODONE Take 1 tablet (5 mg total) by mouth every 6 (six) hours as needed for severe pain.   prochlorperazine 10 MG tablet Commonly known as: COMPAZINE Take 1 tablet (10 mg total) by mouth every 6 (six) hours as needed for nausea or vomiting.   senna-docusate 8.6-50 MG tablet Commonly known as: Senokot-S Take 2 tablets by mouth at bedtime. Do not take if having diarrhea   thiamine 100 MG tablet Commonly known as: Vitamin B-1 TAKE 1 TABLET BY MOUTH EVERY DAY               Discharge Care Instructions  (From admission, onward)           Start     Ordered   01/09/23 0000  Discharge wound care:       Comments: You will have a white honeycomb dressing over your larger incision. This dressing can be removed 5 days after surgery and you do not need to reapply a new dressing. Once you remove the dressing, you will notice that you have the surgical glue (dermabond) on the  incision and this will peel off on its own. You can get this dressing wet in the shower the days after surgery prior to removal on the 5th day.   01/09/23 0757            Follow-up Information     Carver Fila, MD Follow up on 01/14/2023.   Specialty: Gynecologic Oncology Why: around 4:20pm will be a PHONE visit with Dr. Pricilla Holm. IN PERSON visit will be on 02/06/23 at 8:30am at the [facility] Contact information: [address] [place] [address] [phone]                 Greater than thirty minutes were spend  for face to face discharge instructions and discharge orders/summary in EPIC.   Signed: Doylene Bode 01/09/2023, 8:03 AM

## 2023-01-09 NOTE — Discharge Instructions (Addendum)
AFTER SURGERY INSTRUCTIONS   Return to work: 4-6 weeks if applicable   You have a white honeycomb dressing over your larger incision if this takes place. This dressing can be removed 5 days after surgery and you do not need to reapply a new dressing. Once you remove the dressing, you will notice that you have the surgical glue (dermabond) on the incision and this will peel off on its own. You can get this dressing wet in the shower the days after surgery prior to removal on the 5th day.    You will need to be on a blood thinner after surgery to prevent blood clots for 2 weeks.  PLAN ON STARTING ELIQUIS (BLOOD THINNER) tomorrow, January 10, 2023 in the am since you received the blood thinner injection lovenox before leaving the hospital.  YOU WILL TAKE ELIQUIS TWICE DAILY. THIS IS A BLOOD THINNER TO PREVENT BLOOD CLOTS SO YOU WILL NEED TO MONITOR FOR BLEEDING AND CALL THE OFFICE.  AVOID USE OF NSAIDS (IBUPROFEN, NAPROXEN) WHILE TAKING THE BLOOD THINNER.  DO NOT TAKE ASPIRIN WHILE ON ELIQUIS.   Activity: 1. Be up and out of the bed during the day.  Take a nap if needed.  You may walk up steps but be careful and use the hand rail.  Stair climbing will tire you more than you think, you may need to stop part way and rest.    2. No lifting or straining for 6 weeks over 10 pounds. No pushing, pulling, straining for 6 weeks.   3. No driving for around 1-2 week(s).  Do not drive if you are taking narcotic pain medicine and make sure that your reaction time has returned.    4. You can shower as soon as the next day after surgery. Shower daily.  Use your regular soap and water (not directly on the incision) and pat your incision(s) dry afterwards; don't rub.  No tub baths or submerging your body in water until cleared by your surgeon. If you have the soap that was given to you by pre-surgical testing that was used before surgery, you do not need to use it afterwards because this can irritate your  incisions.    5. No sexual activity and nothing in the vagina for 8-10 weeks.   6. You may experience a small amount of clear drainage from your incisions, which is normal.  If the drainage persists, increases, or changes color please call the office.   7. Do not use creams, lotions, or ointments such as neosporin on your incisions after surgery until advised by your surgeon because they can cause removal of the dermabond glue on your incisions.     8. You may experience vaginal spotting after surgery or when the stitches at the top of the vagina begin to dissolve.  The spotting is normal but if you experience heavy bleeding, call our office.   9. Take Tylenol first for pain if you are able to take these medication and only use narcotic pain medication for severe pain not relieved by the Tylenol.  Monitor your Tylenol intake to a max of 4,000 mg in a 24 hour period.    Diet: 1. Low sodium Heart Healthy Diet is recommended but you are cleared to resume your normal (before surgery) diet after your procedure.   2. It is safe to use a laxative, such as Miralax or Colace, if you have difficulty moving your bowels. You will be prescribed Sennakot-S to take at bedtime every  evening after surgery to keep bowel movements regular and to prevent constipation.  YOU NEED TO TAKE SENNAKOT-S EVERY DAY TO KEEP YOUR STOOLS SOFT.   Wound Care: 1. Keep clean and dry.  Shower daily.   Reasons to call the Doctor: Fever - Oral temperature greater than 100.4 degrees Fahrenheit Foul-smelling vaginal discharge Difficulty urinating Nausea and vomiting Increased pain at the site of the incision that is unrelieved with pain medicine. Difficulty breathing with or without chest pain New calf pain especially if only on one side Sudden, continuing increased vaginal bleeding with or without clots.   Contacts: For questions or concerns you should contact:   Dr. Eugene Garnet at [phone]   Warner Mccreedy, NP  at [phone]   After Hours: call [phone] and have the GYN Oncologist paged/contacted (after 5 pm or on the weekends). You will speak with an after hours RN and let he or she know you have had surgery.   Messages sent via mychart are for non-urgent matters and are not responded to after hours so for urgent needs, please call the after hours number.

## 2023-01-09 NOTE — Plan of Care (Signed)
  Problem: Education: Goal: Knowledge of the prescribed therapeutic regimen will improve Outcome: Adequate for Discharge Goal: Understanding of sexual limitations or changes related to disease process or condition will improve Outcome: Adequate for Discharge Goal: Individualized Educational Video(s) Outcome: Adequate for Discharge   Problem: Self-Concept: Goal: Communication of feelings regarding changes in body function or appearance will improve Outcome: Adequate for Discharge   Problem: Skin Integrity: Goal: Demonstration of wound healing without infection will improve Outcome: Adequate for Discharge   Problem: Education: Goal: Knowledge of General Education information will improve Description: Including pain rating scale, medication(s)/side effects and non-pharmacologic comfort measures Outcome: Adequate for Discharge   Problem: Health Behavior/Discharge Planning: Goal: Ability to manage health-related needs will improve Outcome: Adequate for Discharge   Problem: Clinical Measurements: Goal: Ability to maintain clinical measurements within normal limits will improve Outcome: Adequate for Discharge Goal: Will remain free from infection Outcome: Adequate for Discharge Goal: Diagnostic test results will improve Outcome: Adequate for Discharge Goal: Respiratory complications will improve Outcome: Adequate for Discharge Goal: Cardiovascular complication will be avoided Outcome: Adequate for Discharge   Problem: Activity: Goal: Risk for activity intolerance will decrease Outcome: Adequate for Discharge   Problem: Nutrition: Goal: Adequate nutrition will be maintained Outcome: Adequate for Discharge   Problem: Coping: Goal: Level of anxiety will decrease Outcome: Adequate for Discharge   Problem: Elimination: Goal: Will not experience complications related to bowel motility Outcome: Adequate for Discharge Goal: Will not experience complications related to urinary  retention Outcome: Adequate for Discharge   Problem: Pain Managment: Goal: General experience of comfort will improve Outcome: Adequate for Discharge   Problem: Safety: Goal: Ability to remain free from injury will improve Outcome: Adequate for Discharge   Problem: Skin Integrity: Goal: Risk for impaired skin integrity will decrease Outcome: Adequate for Discharge   Problem: Education: Goal: Knowledge of General Education information will improve Description: Including pain rating scale, medication(s)/side effects and non-pharmacologic comfort measures Outcome: Adequate for Discharge   Problem: Health Behavior/Discharge Planning: Goal: Ability to manage health-related needs will improve Outcome: Adequate for Discharge   Problem: Clinical Measurements: Goal: Ability to maintain clinical measurements within normal limits will improve Outcome: Adequate for Discharge Goal: Will remain free from infection Outcome: Adequate for Discharge Goal: Diagnostic test results will improve Outcome: Adequate for Discharge Goal: Respiratory complications will improve Outcome: Adequate for Discharge Goal: Cardiovascular complication will be avoided Outcome: Adequate for Discharge   Problem: Activity: Goal: Risk for activity intolerance will decrease Outcome: Adequate for Discharge   Problem: Nutrition: Goal: Adequate nutrition will be maintained Outcome: Adequate for Discharge   Problem: Coping: Goal: Level of anxiety will decrease Outcome: Adequate for Discharge   Problem: Elimination: Goal: Will not experience complications related to bowel motility Outcome: Adequate for Discharge Goal: Will not experience complications related to urinary retention Outcome: Adequate for Discharge   Problem: Pain Managment: Goal: General experience of comfort will improve Outcome: Adequate for Discharge   Problem: Safety: Goal: Ability to remain free from injury will improve Outcome:  Adequate for Discharge   Problem: Skin Integrity: Goal: Risk for impaired skin integrity will decrease Outcome: Adequate for Discharge

## 2023-01-09 NOTE — Progress Notes (Signed)
2 Days Post-Op Procedure(s) (LRB): DIAGNOSTIC LAPAROSCOPY, XI ROBOTIC ASSISTED TOTAL HYSTERECTOMY BILATERAL SALPINGO OOPHORECTOMY WITH OMENTECTOMY AND DEBULKING WITH MINI LAPAROTOMY (N/A)  Subjective: Patient reports doing well. Passing flatus. No nausea or emesis. Tolerating diet. Ambulating without difficulty. Voiding. Using IS. No concerns voiced.   Objective: Vital signs in last 24 hours: Temp:  [97.6 F (36.4 C)-98 F (36.7 C)] 97.7 F (36.5 C) (09/06 0527) Pulse Rate:  [54-84] 63 (09/06 0527) Resp:  [17-18] 18 (09/06 0527) BP: (115-129)/(50-88) 129/71 (09/06 0527) SpO2:  [98 %-100 %] 100 % (09/06 0527) Last BM Date : 01/07/23  Intake/Output from previous day: 09/05 0701 - 09/06 0700 In: 2285.1 [P.O.:1200; I.V.:1030.1; IV Piggyback:55] Out: 400 [Urine:400]  Physical Examination: General: alert, cooperative, and no distress Resp: clear to auscultation bilaterally Cardio: regular rate and rhythm, S1, S2 normal, no murmur, click, rub or gallop GI: incision: abdominal incisions intact with no active drainage or bleeding, op site intact and abdomen soft, active bowel sounds Extremities: extremities normal, atraumatic, no cyanosis or edema  Assessment: 72 y.o. s/p Procedure(s): DIAGNOSTIC LAPAROSCOPY, XI ROBOTIC ASSISTED TOTAL HYSTERECTOMY BILATERAL SALPINGO OOPHORECTOMY WITH OMENTECTOMY AND DEBULKING WITH MINI LAPAROTOMY: stable Pain:  Pain is well-controlled on PRN medications.  Heme:Hgb 9.8 and Hct 29.2 from 01/08/23. Appropriate given preop values and surgical losses.  ID: WBC 5.0 01/08/23 am. Given intra-op abxs and decadron.  CV: BP and HR stable. Continue to monitor.  GI:  Tolerating po: yes. Diet as tolerated  GU: Voiding. Creatinine 0.73 01/08/23 am. Adequate output reported.    FEN: No critical values on am Bmet from 01/08/2023.  Prophylaxis: SCDs and lovenox ordered  Plan: Plan for discharge today Will be discharged home with 2 weeks of DVT prop   LOS: 2 days     Doylene Bode 01/09/2023, 7:24 AM

## 2023-01-13 LAB — SURGICAL PATHOLOGY

## 2023-01-14 ENCOUNTER — Encounter: Payer: Self-pay | Admitting: Gynecologic Oncology

## 2023-01-14 ENCOUNTER — Inpatient Hospital Stay (HOSPITAL_BASED_OUTPATIENT_CLINIC_OR_DEPARTMENT_OTHER): Payer: Medicare Other | Admitting: Gynecologic Oncology

## 2023-01-14 ENCOUNTER — Encounter: Payer: Self-pay | Admitting: Hematology and Oncology

## 2023-01-14 DIAGNOSIS — Z7189 Other specified counseling: Secondary | ICD-10-CM

## 2023-01-14 DIAGNOSIS — C549 Malignant neoplasm of corpus uteri, unspecified: Secondary | ICD-10-CM

## 2023-01-14 DIAGNOSIS — Z90710 Acquired absence of both cervix and uterus: Secondary | ICD-10-CM | POA: Diagnosis not present

## 2023-01-14 DIAGNOSIS — Z90722 Acquired absence of ovaries, bilateral: Secondary | ICD-10-CM | POA: Diagnosis not present

## 2023-01-14 MED ORDER — OXYCODONE HCL 5 MG PO TABS
5.0000 mg | ORAL_TABLET | Freq: Four times a day (QID) | ORAL | 0 refills | Status: AC | PRN
Start: 2023-01-14 — End: ?

## 2023-01-14 NOTE — Addendum Note (Signed)
Addended by: Carver Fila on: 01/14/2023 04:59 PM   Modules accepted: Orders, Level of Service

## 2023-01-14 NOTE — Progress Notes (Signed)
This encounter was created in error - please disregard.

## 2023-01-14 NOTE — Progress Notes (Signed)
Gynecologic Oncology Telehealth Note: Gyn-Onc  I connected with Colie E Avila on 01/14/23 at  4:20 PM EDT by telephone and verified that I am speaking with the correct person using two identifiers.  I discussed the limitations, risks, security and privacy concerns of performing an evaluation and management service by telemedicine and the availability of in-person appointments. I also discussed with the patient that there may be a patient responsible charge related to this service. The patient expressed understanding and agreed to proceed.  Other persons participating in the visit and their role in the encounter: none.  Patient's location: home Provider's location: [facility]  Reason for Visit: follow-up  Treatment History: Oncology History Overview Note  MMR normal, Her2 positive High grade serous   Uterine cancer (HCC)  08/12/2022 Initial Diagnosis   She presented with PMB   09/24/2022 Pathology Results   Diagnosis 1. Endometrium, curettage - HIGH GRADE SEROUS CARCINOMA (SEE NOTE) 2. Endometrium, resection - HIGH GRADE SEROUS CARCINOMA (SEE NOTE) Diagnosis Note 1. - immunohistochemical stain reveal tumor cells are positive for p16. P53 shows strong expression consistent with mutant type. This case was reviewed with Dr. Reynolds Bowl who agrees with the diagnosis   10/14/2022 Imaging   1. Ill-defined fullness of the uterus and endometrial cavity without discretely visualized mass, in keeping with patient's known endometrial malignancy. 2. Enlarged right iliac and pelvic sidewall lymph nodes, as well as enlarged epicardial lymph nodes or soft tissue nodules. 3. Scattered peritoneal and omental soft tissue nodularity, particularly notable in the left upper quadrant. 4. Soft tissue matting about the distal sigmoid colon and rectum in the low pelvis. 5. Constellation of findings is consistent with nodal, peritoneal, and omental metastatic disease. 6. Small right pleural effusion, nonspecific although  modestly suspicious for a malignant effusion, however without directly visualized pleural mass or nodularity. 7. Pancolonic diverticulosis, severe in the descending and sigmoid colon. 8. Cholelithiasis. 9. Coronary artery disease.   10/17/2022 Initial Diagnosis   Uterine cancer (HCC)   10/17/2022 Cancer Staging   Staging form: Corpus Uteri - Carcinoma and Carcinosarcoma, AJCC 8th Edition - Clinical stage from 10/17/2022: FIGO Stage IVB (cT1b, cN1, cM1) - Signed by Artis Delay, MD on 10/17/2022 Stage prefix: Initial diagnosis   10/23/2022 Echocardiogram   1. Left ventricular ejection fraction, by estimation, is 65 to 70%. Left ventricular ejection fraction by 2D MOD biplane is 68.1 %. The left ventricle has normal function. The left ventricle has no regional wall motion abnormalities. There is mild left ventricular hypertrophy. Left ventricular diastolic parameters are consistent with Grade I diastolic dysfunction (impaired relaxation).  2. Right ventricular systolic function is normal. The right ventricular size is normal. There is normal pulmonary artery systolic pressure. The estimated right ventricular systolic pressure is 27.6 mmHg.  3. The mitral valve is abnormal. Trivial mitral valve regurgitation.  4. The aortic valve is tricuspid. Aortic valve regurgitation is not visualized. Aortic valve sclerosis is present, with no evidence of aortic valve stenosis.  5. The inferior vena cava is normal in size with greater than 50% respiratory variability, suggesting right atrial pressure of 3 mmHg.   10/28/2022 -  Chemotherapy   Patient is on Treatment Plan : UTERINE SEROUS CARCINOMA Carboplatin + Paclitaxel + Trastuzumab q21d x 6 Cycles / Trastuzumab q21d     12/23/2022 Imaging   CT ABDOMEN PELVIS W CONTRAST  Result Date: 12/23/2022 CLINICAL DATA:  Follow-up endometrial cancer, status post chemotherapy EXAM: CT ABDOMEN AND PELVIS WITH CONTRAST TECHNIQUE: Multidetector CT imaging of the abdomen  and  pelvis was performed using the standard protocol following bolus administration of intravenous contrast. RADIATION DOSE REDUCTION: This exam was performed according to the departmental dose-optimization program which includes automated exposure control, adjustment of the mA and/or kV according to patient size and/or use of iterative reconstruction technique. CONTRAST:  75mL OMNIPAQUE IOHEXOL 350 MG/ML SOLN COMPARISON:  10/14/2022 FINDINGS: Lower chest: Small right pleural effusion. Associated bilateral lower lobe atelectasis. Hepatobiliary: Liver is within normal limits. Layering small gallstones (series 3/image 27), without associated inflammatory changes. No intrahepatic or extrahepatic duct dilatation. Pancreas: Within normal limits. Spleen: Within normal limits. Adrenals/Urinary Tract: Adrenal glands are within normal limits. Kidneys are within normal limits.  No hydronephrosis. Bladder is within normal limits. Stomach/Bowel: Stomach is within normal limits. No evidence of bowel obstruction. Normal appendix (series 3/image 46). Left colonic diverticulosis, without evidence of diverticulitis. Vascular/Lymphatic: No evidence of abdominal aortic aneurysm. Atherosclerotic calcifications of the abdominal aorta and branch vessels, although vessels remain patent. Small right iliac nodes, including a dominant 10 mm short axis right obturator node (series 3/image 56), previously 12 mm. Reproductive: Mildly heterogeneous uterus with endometrial thickening measuring up to 16 mm (series 7/image 33), corresponding to the patient's known endometrial cancer. No adnexal masses. Other: No abdominopelvic ascites. Mild peritoneal disease/omental caking beneath the left mid abdominal wall (series 3/image 45), mildly improved. Minimal perirectal soft tissue in the left lower pelvis (series 3/image 58), improved. Musculoskeletal: Degenerative changes of the visualized thoracolumbar spine. IMPRESSION: Mildly heterogeneous uterus with  endometrial thickening, corresponding to the patient's known endometrial cancer. Small right pelvic nodal metastases, improved. Mild peritoneal disease/omental caking, improved Small right pleural effusion. Electronically Signed   By: Charline Bills M.D.   On: 12/23/2022 02:18      01/07/2023 Surgery   Robotic-assisted laparoscopic total hysterectomy with bilateral salpingo-oophorectomy, lysis of adhesions, tumor debulking including peritoneal stripping, omentectomy, mini laparotomy    01/07/2023 Pathology Results   CASE: [id number] PATIENT: Maria Avila Surgical Pathology Report  Reason for Addendum #1:  DNA Mismatch Repair IHC Results  Clinical History: advanced uterine cancer     FINAL MICROSCOPIC DIAGNOSIS:  A. UTERUS, CERVIX, BILATERAL FALLOPIAN TUBES AND OVARIES, RESECTION: - High-grade serous carcinoma, involving the entire endometrial surface, see comment - Carcinoma invades for a depth of 1.6 cm myometrial thickness is 1.7 cm (> 90%) - Cervical stroma is involved by carcinoma - No evidence of lymphovascular invasion - Carcinoma involves surface of bilateral ovaries and left fallopian tube - Benign leiomyomata - See oncology table  B. POSTERIOR CUL DE SAC, EXCISION: - High-grade serous carcinoma with psammomatous calcifications  C. LEFT PELVIC SIDEWALL, EXCISION: - High-grade serous carcinoma with psammomatous calcifications  D. OMENTUM, EXCISION: - High-grade serous carcinoma with psammomatous calcifications  COMMENT:  Immunohistochemical stains for p53 and p16 are diffusely positive in the tumor cells (clonal overexpression pattern), consistent with above diagnosis.  Dr. Kenyon Ana reviewed the case and concurs with the above diagnosis.  ONCOLOGY TABLE:  UTERUS, CARCINOMA OR CARCINOSARCOMA: Resection  Procedure: Total hysterectomy and bilateral salpingo-oophorectomy Histologic Type: Serous carcinoma Histologic Grade: High-grade Myometrial Invasion:       Depth of Myometrial Invasion (mm): 16 mm      Myometrial Thickness (mm): 17 mm      Percentage of Myometrial Invasion: >90% Uterine Serosa Involvement: Not identified Cervical stromal Involvement: Present Extent of involvement of other tissue/organs: Bilateral ovaries, left fallopian tube and pelvic sidewall Peritoneal/Ascitic Fluid: Not applicable Lymphovascular Invasion: Not identified Regional Lymph Nodes: Not applicable (no lymph nodes  submitted or found)  Distant Metastasis:      Distant Site(s) Involved: Omentum Pathologic Stage Classification (pTNM, AJCC 8th Edition): pT3a, pN not assigned Ancillary Studies: MMR / MSI testing will be ordered      Interval History: Doing well and improving each day.  Still sore, using Tylenol as needed without much relief. Reports normal bowel function since Friday.  Denies any urinary symptoms.  Appetite is improving.  Past Medical/Surgical History: Past Medical History:  Diagnosis Date   Colitis    Endometrial cancer (HCC)    Hyperlipidemia    Hypertension    Malnutrition (HCC)    Stroke (HCC) 2019   balance still affected    Past Surgical History:  Procedure Laterality Date   BIOPSY  02/26/2018   Procedure: BIOPSY;  Surgeon: Charlott Rakes, MD;  Location: [facility] ENDOSCOPY;  Service: Endoscopy;;   COLONOSCOPY WITH PROPOFOL N/A 02/26/2018   Procedure: COLONOSCOPY WITH PROPOFOL;  Surgeon: Charlott Rakes, MD;  Location: [facility] ENDOSCOPY;  Service: Endoscopy;  Laterality: N/A;   ESOPHAGOGASTRODUODENOSCOPY (EGD) WITH PROPOFOL N/A 02/26/2018   Procedure: ESOPHAGOGASTRODUODENOSCOPY (EGD) WITH PROPOFOL;  Surgeon: Charlott Rakes, MD;  Location: [facility] ENDOSCOPY;  Service: Endoscopy;  Laterality: N/A;   IR IMAGING GUIDED PORT INSERTION  10/21/2022   NO PAST SURGERIES     UNCERTAIN OF NAME OF SURGERY    Family History  Problem Relation Age of Onset   Alzheimer's disease Mother    Colon cancer Father    Endometrial cancer Sister         gyn, unsure type   Breast cancer Neg Hx    Ovarian cancer Neg Hx    Prostate cancer Neg Hx    Pancreatic cancer Neg Hx     Social History   Socioeconomic History   Marital status: Married    Spouse name: Not on file   Number of children: Not on file   Years of education: Not on file   Highest education level: Not on file  Occupational History   Not on file  Tobacco Use   Smoking status: Former    Current packs/day: 0.00    Types: Cigarettes    Quit date: 31    Years since quitting: 30.7   Smokeless tobacco: Never  Vaping Use   Vaping status: Never Used  Substance and Sexual Activity   Alcohol use: Yes    Comment: occ wine   Drug use: Not Currently   Sexual activity: Yes  Other Topics Concern   Not on file  Social History Narrative   Not on file   Social Determinants of Health   Financial Resource Strain: Not on file  Food Insecurity: No Food Insecurity (10/16/2022)   Hunger Vital Sign    Worried About Running Out of Food in the Last Year: Never true    Ran Out of Food in the Last Year: Never true  Transportation Needs: No Transportation Needs (10/16/2022)   PRAPARE - Administrator, Civil Service (Medical): No    Lack of Transportation (Non-Medical): No  Physical Activity: Not on file  Stress: Not on file  Social Connections: Not on file    Current Medications:  Current Outpatient Medications:    oxyCODONE (OXY IR/ROXICODONE) 5 MG immediate release tablet, Take 1 tablet (5 mg total) by mouth every 6 (six) hours as needed for severe pain., Disp: 6 tablet, Rfl: 0   acetaminophen (TYLENOL) 325 MG tablet, Take 1-2 tablets (325-650 mg total) by mouth every 4 (four) hours  as needed for mild pain., Disp: , Rfl:    apixaban (ELIQUIS) 2.5 MG TABS tablet, Take 1 tablet (2.5 mg total) by mouth 2 (two) times daily. Plan to start 01/10/23, Disp: 24 tablet, Rfl: 0   benazepril-hydrochlorthiazide (LOTENSIN HCT) 20-25 MG tablet, Take 1 tablet by mouth daily., Disp:  , Rfl:    [employer] VITAMIN C 500 MG tablet, TAKE 1 TABLET BY MOUTH TWICE A DAY, Disp: 100 tablet, Rfl: 0   ezetimibe (ZETIA) 10 MG tablet, Take 10 mg by mouth daily., Disp: , Rfl:    lidocaine-prilocaine (EMLA) cream, Apply to affected area once (Patient taking differently: Apply 1 Application topically daily as needed (prior to port access).), Disp: 30 g, Rfl: 3   Multiple Vitamin (MULTIVITAMIN) capsule, Take 1 capsule by mouth daily., Disp: , Rfl:    ondansetron (ZOFRAN) 8 MG tablet, Take 1 tablet (8 mg total) by mouth every 8 (eight) hours as needed for nausea., Disp: 30 tablet, Rfl: 3   prochlorperazine (COMPAZINE) 10 MG tablet, Take 1 tablet (10 mg total) by mouth every 6 (six) hours as needed for nausea or vomiting., Disp: 30 tablet, Rfl: 1   senna-docusate (SENOKOT-S) 8.6-50 MG tablet, Take 2 tablets by mouth at bedtime. Do not take if having diarrhea, Disp: 60 tablet, Rfl: 0   thiamine (VITAMIN B-1) 100 MG tablet, TAKE 1 TABLET BY MOUTH EVERY DAY, Disp: 30 tablet, Rfl: 0  Review of Symptoms: Pertinent positives as per HPI.  Physical Exam: Deferred given limitations of phone visit.  Laboratory & Radiologic Studies: None new  Assessment & Plan: Decarla E Avila is a 72 y.o. woman with Stage IVB uterine serous carcinoma s/p 3 cycles of NACT  now s/p IDS.  Patient is overall doing well.  Meeting postoperative milestones.  Discussed intermittent ibuprofen use would be okay.  Also sent in small prescription for additional oxycodone.  Reviewed pathology from surgery.  Discussed postoperative expectations and restrictions.  I discussed the assessment and treatment plan with the patient. The patient was provided with an opportunity to ask questions and all were answered. The patient agreed with the plan and demonstrated an understanding of the instructions.   The patient was advised to call back or see an in-person evaluation if the symptoms worsen or if the condition fails to improve as  anticipated.   8 minutes of total time was spent for this patient encounter, including preparation, phone counseling with the patient and coordination of care, and documentation of the encounter.   Eugene Garnet, MD  Division of Gynecologic Oncology  Department of Obstetrics and Gynecology  [facility]

## 2023-01-16 ENCOUNTER — Other Ambulatory Visit: Payer: Self-pay

## 2023-01-20 ENCOUNTER — Ambulatory Visit: Payer: Medicare Other

## 2023-01-20 ENCOUNTER — Ambulatory Visit: Payer: Medicare Other | Admitting: Hematology and Oncology

## 2023-01-20 ENCOUNTER — Other Ambulatory Visit: Payer: Medicare Other

## 2023-01-21 ENCOUNTER — Other Ambulatory Visit (HOSPITAL_COMMUNITY): Payer: Self-pay

## 2023-01-21 ENCOUNTER — Other Ambulatory Visit (HOSPITAL_BASED_OUTPATIENT_CLINIC_OR_DEPARTMENT_OTHER): Payer: Self-pay

## 2023-01-31 ENCOUNTER — Other Ambulatory Visit: Payer: Self-pay

## 2023-02-04 ENCOUNTER — Other Ambulatory Visit: Payer: Self-pay

## 2023-02-04 DIAGNOSIS — C55 Malignant neoplasm of uterus, part unspecified: Secondary | ICD-10-CM

## 2023-02-04 DIAGNOSIS — D709 Neutropenia, unspecified: Secondary | ICD-10-CM

## 2023-02-04 DIAGNOSIS — T451X5A Adverse effect of antineoplastic and immunosuppressive drugs, initial encounter: Secondary | ICD-10-CM

## 2023-02-04 MED FILL — Fosaprepitant Dimeglumine For IV Infusion 150 MG (Base Eq): INTRAVENOUS | Qty: 5 | Status: AC

## 2023-02-04 MED FILL — Dexamethasone Sodium Phosphate Inj 100 MG/10ML: INTRAMUSCULAR | Qty: 1 | Status: AC

## 2023-02-04 NOTE — Progress Notes (Unsigned)
Patient called to discuss rescheduling her visit (due to need for emergent surgery for another patient). Patient understanding and willing to reschedule visit to next week. Reached out to my office to change appointment.  Eugene Garnet MD Gynecologic Oncology

## 2023-02-05 ENCOUNTER — Encounter: Payer: Self-pay | Admitting: Hematology and Oncology

## 2023-02-05 ENCOUNTER — Inpatient Hospital Stay: Payer: Medicare Other | Admitting: Hematology and Oncology

## 2023-02-05 ENCOUNTER — Inpatient Hospital Stay: Payer: Medicare Other

## 2023-02-05 ENCOUNTER — Inpatient Hospital Stay: Payer: Medicare Other | Attending: Gynecologic Oncology

## 2023-02-05 VITALS — BP 139/80 | HR 59 | Temp 98.3°F | Resp 18 | Ht 62.0 in | Wt 177.8 lb

## 2023-02-05 DIAGNOSIS — Z90710 Acquired absence of both cervix and uterus: Secondary | ICD-10-CM | POA: Diagnosis not present

## 2023-02-05 DIAGNOSIS — Z23 Encounter for immunization: Secondary | ICD-10-CM | POA: Insufficient documentation

## 2023-02-05 DIAGNOSIS — Z87891 Personal history of nicotine dependence: Secondary | ICD-10-CM | POA: Diagnosis not present

## 2023-02-05 DIAGNOSIS — R634 Abnormal weight loss: Secondary | ICD-10-CM | POA: Insufficient documentation

## 2023-02-05 DIAGNOSIS — Z8673 Personal history of transient ischemic attack (TIA), and cerebral infarction without residual deficits: Secondary | ICD-10-CM | POA: Diagnosis not present

## 2023-02-05 DIAGNOSIS — C55 Malignant neoplasm of uterus, part unspecified: Secondary | ICD-10-CM | POA: Diagnosis present

## 2023-02-05 DIAGNOSIS — C775 Secondary and unspecified malignant neoplasm of intrapelvic lymph nodes: Secondary | ICD-10-CM | POA: Diagnosis not present

## 2023-02-05 DIAGNOSIS — Z1731 Human epidermal growth factor receptor 2 positive status: Secondary | ICD-10-CM | POA: Insufficient documentation

## 2023-02-05 DIAGNOSIS — D61818 Other pancytopenia: Secondary | ICD-10-CM | POA: Diagnosis not present

## 2023-02-05 DIAGNOSIS — Z5111 Encounter for antineoplastic chemotherapy: Secondary | ICD-10-CM | POA: Insufficient documentation

## 2023-02-05 DIAGNOSIS — Z90722 Acquired absence of ovaries, bilateral: Secondary | ICD-10-CM | POA: Insufficient documentation

## 2023-02-05 DIAGNOSIS — Z5112 Encounter for antineoplastic immunotherapy: Secondary | ICD-10-CM | POA: Diagnosis not present

## 2023-02-05 DIAGNOSIS — C786 Secondary malignant neoplasm of retroperitoneum and peritoneum: Secondary | ICD-10-CM | POA: Insufficient documentation

## 2023-02-05 DIAGNOSIS — T451X5A Adverse effect of antineoplastic and immunosuppressive drugs, initial encounter: Secondary | ICD-10-CM

## 2023-02-05 DIAGNOSIS — D709 Neutropenia, unspecified: Secondary | ICD-10-CM

## 2023-02-05 LAB — CBC WITH DIFFERENTIAL (CANCER CENTER ONLY)
Abs Immature Granulocytes: 0 10*3/uL (ref 0.00–0.07)
Basophils Absolute: 0 10*3/uL (ref 0.0–0.1)
Basophils Relative: 1 %
Eosinophils Absolute: 0.2 10*3/uL (ref 0.0–0.5)
Eosinophils Relative: 4 %
HCT: 30.2 % — ABNORMAL LOW (ref 36.0–46.0)
Hemoglobin: 10.6 g/dL — ABNORMAL LOW (ref 12.0–15.0)
Immature Granulocytes: 0 %
Lymphocytes Relative: 55 %
Lymphs Abs: 2.4 10*3/uL (ref 0.7–4.0)
MCH: 34.2 pg — ABNORMAL HIGH (ref 26.0–34.0)
MCHC: 35.1 g/dL (ref 30.0–36.0)
MCV: 97.4 fL (ref 80.0–100.0)
Monocytes Absolute: 0.3 10*3/uL (ref 0.1–1.0)
Monocytes Relative: 7 %
Neutro Abs: 1.4 10*3/uL — ABNORMAL LOW (ref 1.7–7.7)
Neutrophils Relative %: 33 %
Platelet Count: 176 10*3/uL (ref 150–400)
RBC: 3.1 MIL/uL — ABNORMAL LOW (ref 3.87–5.11)
RDW: 15.5 % (ref 11.5–15.5)
WBC Count: 4.3 10*3/uL (ref 4.0–10.5)
nRBC: 0 % (ref 0.0–0.2)

## 2023-02-05 LAB — CMP (CANCER CENTER ONLY)
ALT: 13 U/L (ref 0–44)
AST: 16 U/L (ref 15–41)
Albumin: 4.5 g/dL (ref 3.5–5.0)
Alkaline Phosphatase: 59 U/L (ref 38–126)
Anion gap: 6 (ref 5–15)
BUN: 18 mg/dL (ref 8–23)
CO2: 30 mmol/L (ref 22–32)
Calcium: 10.1 mg/dL (ref 8.9–10.3)
Chloride: 105 mmol/L (ref 98–111)
Creatinine: 0.81 mg/dL (ref 0.44–1.00)
GFR, Estimated: >60 mL/min (ref >60)
Glucose, Bld: 118 mg/dL — ABNORMAL HIGH (ref 70–99)
Potassium: 3.4 mmol/L — ABNORMAL LOW (ref 3.5–5.1)
Sodium: 141 mmol/L (ref 135–145)
Total Bilirubin: 0.4 mg/dL (ref 0.3–1.2)
Total Protein: 7.4 g/dL (ref 6.5–8.1)

## 2023-02-05 MED ORDER — SODIUM CHLORIDE 0.9% FLUSH
10.0000 mL | Freq: Once | INTRAVENOUS | Status: AC
  Administered 2023-02-05: 10 mL

## 2023-02-05 MED ORDER — FAMOTIDINE IN NACL 20-0.9 MG/50ML-% IV SOLN
20.0000 mg | Freq: Once | INTRAVENOUS | Status: AC
  Administered 2023-02-05: 20 mg via INTRAVENOUS
  Filled 2023-02-05: qty 50

## 2023-02-05 MED ORDER — SODIUM CHLORIDE 0.9 % IV SOLN
150.0000 mg | Freq: Once | INTRAVENOUS | Status: AC
  Administered 2023-02-05: 150 mg via INTRAVENOUS
  Filled 2023-02-05: qty 150

## 2023-02-05 MED ORDER — OXYCODONE HCL 5 MG PO TABS
5.0000 mg | ORAL_TABLET | ORAL | 0 refills | Status: DC | PRN
Start: 2023-02-05 — End: 2023-03-19

## 2023-02-05 MED ORDER — SODIUM CHLORIDE 0.9% FLUSH
10.0000 mL | INTRAVENOUS | Status: DC | PRN
  Administered 2023-02-05: 10 mL

## 2023-02-05 MED ORDER — SODIUM CHLORIDE 0.9 % IV SOLN
476.5000 mg | Freq: Once | INTRAVENOUS | Status: AC
  Administered 2023-02-05: 480 mg via INTRAVENOUS
  Filled 2023-02-05: qty 48

## 2023-02-05 MED ORDER — TRASTUZUMAB-ANNS CHEMO 150 MG IV SOLR
6.0000 mg/kg | Freq: Once | INTRAVENOUS | Status: AC
  Administered 2023-02-05: 525 mg via INTRAVENOUS
  Filled 2023-02-05: qty 25

## 2023-02-05 MED ORDER — CETIRIZINE HCL 10 MG/ML IV SOLN
10.0000 mg | Freq: Once | INTRAVENOUS | Status: AC
  Administered 2023-02-05: 10 mg via INTRAVENOUS
  Filled 2023-02-05: qty 1

## 2023-02-05 MED ORDER — PALONOSETRON HCL INJECTION 0.25 MG/5ML
0.2500 mg | Freq: Once | INTRAVENOUS | Status: AC
  Administered 2023-02-05: 0.25 mg via INTRAVENOUS
  Filled 2023-02-05: qty 5

## 2023-02-05 MED ORDER — SODIUM CHLORIDE 0.9 % IV SOLN
175.0000 mg/m2 | Freq: Once | INTRAVENOUS | Status: AC
  Administered 2023-02-05: 348 mg via INTRAVENOUS
  Filled 2023-02-05: qty 58

## 2023-02-05 MED ORDER — INFLUENZA VAC A&B SURF ANT ADJ 0.5 ML IM SUSY
0.5000 mL | PREFILLED_SYRINGE | Freq: Once | INTRAMUSCULAR | Status: AC
  Administered 2023-02-05: 0.5 mL via INTRAMUSCULAR
  Filled 2023-02-05: qty 0.5

## 2023-02-05 MED ORDER — HEPARIN SOD (PORK) LOCK FLUSH 100 UNIT/ML IV SOLN
500.0000 [IU] | Freq: Once | INTRAVENOUS | Status: AC | PRN
  Administered 2023-02-05: 500 [IU]

## 2023-02-05 MED ORDER — SODIUM CHLORIDE 0.9 % IV SOLN: Freq: Once | INTRAVENOUS | Status: AC

## 2023-02-05 MED ORDER — SODIUM CHLORIDE 0.9 % IV SOLN
10.0000 mg | Freq: Once | INTRAVENOUS | Status: AC
  Administered 2023-02-05: 10 mg via INTRAVENOUS
  Filled 2023-02-05: qty 10

## 2023-02-05 MED ORDER — ACETAMINOPHEN 325 MG PO TABS
650.0000 mg | ORAL_TABLET | Freq: Once | ORAL | Status: AC
  Administered 2023-02-05: 650 mg via ORAL
  Filled 2023-02-05: qty 2

## 2023-02-05 NOTE — Progress Notes (Signed)
[facility] OFFICE PROGRESS NOTE  Patient Care Team: Merri Brunette, MD as PCP - General (Internal Medicine)  ASSESSMENT & PLAN:  Uterine cancer [facility]) The plan will be to continue adjuvant treatment for 3 cycles before repeating imaging study I refilled her prescriptions today We discussed the importance of preventive care and reviewed the vaccination programs. She does not have any prior allergic reactions to influenza vaccination. She agrees to proceed with influenza vaccination today and we will administer it today at the clinic. I plan to order echocardiogram before her next treatment  Pancytopenia, acquired [facility]) Due to recent treatment She is not symptomatic We will proceed without delay  Orders Placed This Encounter  Procedures   ECHOCARDIOGRAM COMPLETE    Standing Status:   Future    Standing Expiration Date:   02/05/2024    Order Specific Question:   Where should this test be performed    Answer:   [facility]    Order Specific Question:   Perflutren DEFINITY (image enhancing agent) should be administered unless hypersensitivity or allergy exist    Answer:   Administer Perflutren    Order Specific Question:   Reason for exam-Echo    Answer:   Chemo  Z09    All questions were answered. The patient knows to call the clinic with any problems, questions or concerns. The total time spent in the appointment was 40 minutes encounter with patients including review of chart and various tests results, discussions about plan of care and coordination of care plan   Artis Delay, MD 02/05/2023 12:56 PM  INTERVAL HISTORY: Please see below for problem oriented charting. she returns for resumption of chemotherapy after surgery I have reviewed her surgical report and pathology report She is doing well Her wound is healing well She denies residual neuropathy from prior treatment No recent infection Denies recent bleeding No recent concerns for wound healing after  surgery We discussed the plan for treatment and echocardiogram monitoring and future timing of imaging study  REVIEW OF SYSTEMS:   Constitutional: Denies fevers, chills or abnormal weight loss Eyes: Denies blurriness of vision Ears, nose, mouth, throat, and face: Denies mucositis or sore throat Respiratory: Denies cough, dyspnea or wheezes Cardiovascular: Denies palpitation, chest discomfort or lower extremity swelling Gastrointestinal:  Denies nausea, heartburn or change in bowel habits Skin: Denies abnormal skin rashes Lymphatics: Denies new lymphadenopathy or easy bruising Neurological:Denies numbness, tingling or new weaknesses Behavioral/Psych: Mood is stable, no new changes  All other systems were reviewed with the patient and are negative.  I have reviewed the past medical history, past surgical history, social history and family history with the patient and they are unchanged from previous note.  ALLERGIES:  is allergic to crestor [rosuvastatin calcium] and zocor [simvastatin].  MEDICATIONS:  Current Outpatient Medications  Medication Sig Dispense Refill   oxyCODONE (OXY IR/ROXICODONE) 5 MG immediate release tablet Take 1 tablet (5 mg total) by mouth every 4 (four) hours as needed for severe pain. 30 tablet 0   acetaminophen (TYLENOL) 325 MG tablet Take 1-2 tablets (325-650 mg total) by mouth every 4 (four) hours as needed for mild pain.     apixaban (ELIQUIS) 2.5 MG TABS tablet Take 1 tablet (2.5 mg total) by mouth 2 (two) times daily. Plan to start 01/10/23 (Patient not taking: Reported on 01/30/2023) 24 tablet 0   benazepril-hydrochlorthiazide (LOTENSIN HCT) 20-25 MG tablet Take 1 tablet by mouth daily.     [employer] VITAMIN C 500 MG tablet TAKE  1 TABLET BY MOUTH TWICE A DAY 100 tablet 0   ezetimibe (ZETIA) 10 MG tablet Take 10 mg by mouth daily.     lidocaine-prilocaine (EMLA) cream Apply to affected area once (Patient taking differently: Apply 1 Application topically daily as needed  (prior to port access).) 30 g 3   Multiple Vitamin (MULTIVITAMIN) capsule Take 1 capsule by mouth daily.     ondansetron (ZOFRAN) 8 MG tablet Take 1 tablet (8 mg total) by mouth every 8 (eight) hours as needed for nausea. 30 tablet 3   prochlorperazine (COMPAZINE) 10 MG tablet Take 1 tablet (10 mg total) by mouth every 6 (six) hours as needed for nausea or vomiting. 30 tablet 1   senna-docusate (SENOKOT-S) 8.6-50 MG tablet Take 2 tablets by mouth at bedtime. Do not take if having diarrhea 60 tablet 0   thiamine (VITAMIN B-1) 100 MG tablet TAKE 1 TABLET BY MOUTH EVERY DAY 30 tablet 0   No current facility-administered medications for this visit.   Facility-Administered Medications Ordered in Other Visits  Medication Dose Route Frequency Provider Last Rate Last Admin   CARBOplatin (PARAPLATIN) 480 mg in sodium chloride 0.9 % 250 mL chemo infusion  480 mg Intravenous Once Bertis Ruddy, Naiah Donahoe, MD       heparin lock flush 100 unit/mL  500 Units Intracatheter Once PRN Artis Delay, MD       influenza vaccine adjuvanted (FLUAD) injection 0.5 mL  0.5 mL Intramuscular Once Bertis Ruddy, Shamra Bradeen, MD       PACLitaxel (TAXOL) 348 mg in sodium chloride 0.9 % 500 mL chemo infusion (> 80mg /m2)  175 mg/m2 (Treatment Plan Recorded) Intravenous Once Bertis Ruddy, Waynette Towers, MD       sodium chloride flush (NS) 0.9 % injection 10 mL  10 mL Intracatheter PRN Bertis Ruddy, Cathyrn Deas, MD       trastuzumab-anns (KANJINTI) 525 mg in sodium chloride 0.9 % 250 mL chemo infusion  6 mg/kg (Treatment Plan Recorded) Intravenous Once Artis Delay, MD        SUMMARY OF ONCOLOGIC HISTORY: Oncology History Overview Note  MMR normal, Her2 positive High grade serous   Uterine cancer (HCC)  08/12/2022 Initial Diagnosis   She presented with PMB   09/24/2022 Pathology Results   Diagnosis 1. Endometrium, curettage - HIGH GRADE SEROUS CARCINOMA (SEE NOTE) 2. Endometrium, resection - HIGH GRADE SEROUS CARCINOMA (SEE NOTE) Diagnosis Note 1. - immunohistochemical stain  reveal tumor cells are positive for p16. P53 shows strong expression consistent with mutant type. This case was reviewed with Dr. Reynolds Bowl who agrees with the diagnosis   10/14/2022 Imaging   1. Ill-defined fullness of the uterus and endometrial cavity without discretely visualized mass, in keeping with patient's known endometrial malignancy. 2. Enlarged right iliac and pelvic sidewall lymph nodes, as well as enlarged epicardial lymph nodes or soft tissue nodules. 3. Scattered peritoneal and omental soft tissue nodularity, particularly notable in the left upper quadrant. 4. Soft tissue matting about the distal sigmoid colon and rectum in the low pelvis. 5. Constellation of findings is consistent with nodal, peritoneal, and omental metastatic disease. 6. Small right pleural effusion, nonspecific although modestly suspicious for a malignant effusion, however without directly visualized pleural mass or nodularity. 7. Pancolonic diverticulosis, severe in the descending and sigmoid colon. 8. Cholelithiasis. 9. Coronary artery disease.   10/17/2022 Initial Diagnosis   Uterine cancer (HCC)   10/17/2022 Cancer Staging   Staging form: Corpus Uteri - Carcinoma and Carcinosarcoma, AJCC 8th Edition - Clinical stage from 10/17/2022: FIGO Stage IVB (  cT1b, cN1, cM1) - Signed by Artis Delay, MD on 10/17/2022 Stage prefix: Initial diagnosis   10/23/2022 Echocardiogram   1. Left ventricular ejection fraction, by estimation, is 65 to 70%. Left ventricular ejection fraction by 2D MOD biplane is 68.1 %. The left ventricle has normal function. The left ventricle has no regional wall motion abnormalities. There is mild left ventricular hypertrophy. Left ventricular diastolic parameters are consistent with Grade I diastolic dysfunction (impaired relaxation).  2. Right ventricular systolic function is normal. The right ventricular size is normal. There is normal pulmonary artery systolic pressure. The estimated right ventricular  systolic pressure is 27.6 mmHg.  3. The mitral valve is abnormal. Trivial mitral valve regurgitation.  4. The aortic valve is tricuspid. Aortic valve regurgitation is not visualized. Aortic valve sclerosis is present, with no evidence of aortic valve stenosis.  5. The inferior vena cava is normal in size with greater than 50% respiratory variability, suggesting right atrial pressure of 3 mmHg.   10/28/2022 -  Chemotherapy   Patient is on Treatment Plan : UTERINE SEROUS CARCINOMA Carboplatin + Paclitaxel + Trastuzumab q21d x 6 Cycles / Trastuzumab q21d     12/23/2022 Imaging   CT ABDOMEN PELVIS W CONTRAST  Result Date: 12/23/2022 CLINICAL DATA:  Follow-up endometrial cancer, status post chemotherapy EXAM: CT ABDOMEN AND PELVIS WITH CONTRAST TECHNIQUE: Multidetector CT imaging of the abdomen and pelvis was performed using the standard protocol following bolus administration of intravenous contrast. RADIATION DOSE REDUCTION: This exam was performed according to the departmental dose-optimization program which includes automated exposure control, adjustment of the mA and/or kV according to patient size and/or use of iterative reconstruction technique. CONTRAST:  75mL OMNIPAQUE IOHEXOL 350 MG/ML SOLN COMPARISON:  10/14/2022 FINDINGS: Lower chest: Small right pleural effusion. Associated bilateral lower lobe atelectasis. Hepatobiliary: Liver is within normal limits. Layering small gallstones (series 3/image 27), without associated inflammatory changes. No intrahepatic or extrahepatic duct dilatation. Pancreas: Within normal limits. Spleen: Within normal limits. Adrenals/Urinary Tract: Adrenal glands are within normal limits. Kidneys are within normal limits.  No hydronephrosis. Bladder is within normal limits. Stomach/Bowel: Stomach is within normal limits. No evidence of bowel obstruction. Normal appendix (series 3/image 46). Left colonic diverticulosis, without evidence of diverticulitis. Vascular/Lymphatic: No  evidence of abdominal aortic aneurysm. Atherosclerotic calcifications of the abdominal aorta and branch vessels, although vessels remain patent. Small right iliac nodes, including a dominant 10 mm short axis right obturator node (series 3/image 56), previously 12 mm. Reproductive: Mildly heterogeneous uterus with endometrial thickening measuring up to 16 mm (series 7/image 33), corresponding to the patient's known endometrial cancer. No adnexal masses. Other: No abdominopelvic ascites. Mild peritoneal disease/omental caking beneath the left mid abdominal wall (series 3/image 45), mildly improved. Minimal perirectal soft tissue in the left lower pelvis (series 3/image 58), improved. Musculoskeletal: Degenerative changes of the visualized thoracolumbar spine. IMPRESSION: Mildly heterogeneous uterus with endometrial thickening, corresponding to the patient's known endometrial cancer. Small right pelvic nodal metastases, improved. Mild peritoneal disease/omental caking, improved Small right pleural effusion. Electronically Signed   By: Charline Bills M.D.   On: 12/23/2022 02:18      01/07/2023 Surgery   Robotic-assisted laparoscopic total hysterectomy with bilateral salpingo-oophorectomy, lysis of adhesions, tumor debulking including peritoneal stripping, omentectomy, mini laparotomy    01/07/2023 Pathology Results   CASE: [id number] PATIENT: Maria Avila Surgical Pathology Report  Reason for Addendum #1:  DNA Mismatch Repair IHC Results  Clinical History: advanced uterine cancer     FINAL MICROSCOPIC DIAGNOSIS:  A. UTERUS,  CERVIX, BILATERAL FALLOPIAN TUBES AND OVARIES, RESECTION: - High-grade serous carcinoma, involving the entire endometrial surface, see comment - Carcinoma invades for a depth of 1.6 cm myometrial thickness is 1.7 cm (> 90%) - Cervical stroma is involved by carcinoma - No evidence of lymphovascular invasion - Carcinoma involves surface of bilateral ovaries and left  fallopian tube - Benign leiomyomata - See oncology table  B. POSTERIOR CUL DE SAC, EXCISION: - High-grade serous carcinoma with psammomatous calcifications  C. LEFT PELVIC SIDEWALL, EXCISION: - High-grade serous carcinoma with psammomatous calcifications  D. OMENTUM, EXCISION: - High-grade serous carcinoma with psammomatous calcifications  COMMENT:  Immunohistochemical stains for p53 and p16 are diffusely positive in the tumor cells (clonal overexpression pattern), consistent with above diagnosis.  Dr. Kenyon Ana reviewed the case and concurs with the above diagnosis.  ONCOLOGY TABLE:  UTERUS, CARCINOMA OR CARCINOSARCOMA: Resection  Procedure: Total hysterectomy and bilateral salpingo-oophorectomy Histologic Type: Serous carcinoma Histologic Grade: High-grade Myometrial Invasion:      Depth of Myometrial Invasion (mm): 16 mm      Myometrial Thickness (mm): 17 mm      Percentage of Myometrial Invasion: >90% Uterine Serosa Involvement: Not identified Cervical stromal Involvement: Present Extent of involvement of other tissue/organs: Bilateral ovaries, left fallopian tube and pelvic sidewall Peritoneal/Ascitic Fluid: Not applicable Lymphovascular Invasion: Not identified Regional Lymph Nodes: Not applicable (no lymph nodes submitted or found)  Distant Metastasis:      Distant Site(s) Involved: Omentum Pathologic Stage Classification (pTNM, AJCC 8th Edition): pT3a, pN not assigned Ancillary Studies: MMR / MSI testing will be ordered    01/07/2023 Surgery   OPERATIVE NOTE  Pre-operative Diagnosis: 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  On intra-abdominal entry, normal upper abdominal survey.  Omentum adherent to the sigmoid epiploica and the descending colon.  5 mm tumor implant along the left pelvic brim.  Normal-appearing bilateral tubes and ovaries although the sigmoid colon and epiploica adherent to the medial aspect of the left adnexa and posterior uterus.  Miliary disease noted over the anterior aspect of the sigmoid colon, rectum, and posterior cul-de-sac peritoneum.  Some obliteration of the deep posterior cul-de-sac.  On rectal exam, approximately 7-8 cm above the anal verge, just superior to the cervicovaginal junction, there is some tethering of the rectum.  Some adhesions between the bladder and lower uterine segment.  Minimal pelvic ascites.  Medical comorbidities as well as what would have required a very low rectal resection and anastomosis to achieve complete cytoreduction with the need for a diverting ileostomy, decision made to remove the bulk of disease without bowel surgery.  Much of the pelvic peritoneum with miliary disease stripped within the cul-de-sac.     PHYSICAL EXAMINATION: ECOG PERFORMANCE STATUS: 1 - Symptomatic but completely ambulatory  There were no vitals filed for this visit. There were no vitals filed for this visit.  GENERAL:alert, no distress and comfortable   LABORATORY DATA:  I have reviewed the data as listed    Component Value Date/Time   NA 141 02/05/2023 1013   NA 144 05/13/2018 1500   K 3.4 (L) 02/05/2023 1013   CL 105 02/05/2023 1013   CO2 30 02/05/2023 1013  GLUCOSE 118 (H) 02/05/2023 1013   BUN 18 02/05/2023 1013   BUN 13 05/13/2018 1500   CREATININE 0.81 02/05/2023 1013   CALCIUM 10.1 02/05/2023 1013   PROT 7.4 02/05/2023 1013   ALBUMIN 4.5 02/05/2023 1013   AST 16 02/05/2023 1013   ALT 13 02/05/2023 1013   ALKPHOS 59 02/05/2023 1013   BILITOT 0.4 02/05/2023 1013   GFRNONAA >60 02/05/2023 1013   GFRAA 110  05/13/2018 1500   GFRAA >60 07/21/2017 1131    No results found for: "SPEP", "UPEP"  Lab Results  Component Value Date   WBC 4.3 02/05/2023   NEUTROABS 1.4 (L) 02/05/2023   HGB 10.6 (L) 02/05/2023   HCT 30.2 (L) 02/05/2023   MCV 97.4 02/05/2023   PLT 176 02/05/2023      Chemistry      Component Value Date/Time   NA 141 02/05/2023 1013   NA 144 05/13/2018 1500   K 3.4 (L) 02/05/2023 1013   CL 105 02/05/2023 1013   CO2 30 02/05/2023 1013   BUN 18 02/05/2023 1013   BUN 13 05/13/2018 1500   CREATININE 0.81 02/05/2023 1013      Component Value Date/Time   CALCIUM 10.1 02/05/2023 1013   ALKPHOS 59 02/05/2023 1013   AST 16 02/05/2023 1013   ALT 13 02/05/2023 1013   BILITOT 0.4 02/05/2023 1013       RADIOGRAPHIC STUDIES: I have personally reviewed the radiological images as listed and agreed with the findings in the report. VAS [place] LOWER EXTREMITY VENOUS (DVT)  Result Date: 01/06/2023  Lower Venous DVT Study Patient Name:  Maria Avila  Date of Exam:   01/06/2023 Medical Rec #: [id number]      Accession #:    [id number] Date of Birth: 01/04/1951      Patient Gender: F Patient Age:   72 years Exam Location:  [facility] Procedure:      VAS [place] LOWER EXTREMITY VENOUS (DVT) Referring Phys: Eugene Garnet --------------------------------------------------------------------------------  Indications: Swelling.  Risk Factors: Cancer / recent chemotherapy. Limitations: Calcific shadowing. Comparison Study: Previous exam on 12/16/18 was negative for DVT Performing Technologist: Ernestene Mention RVT, RDMS  Examination Guidelines: A complete evaluation includes B-mode imaging, spectral Doppler, color Doppler, and power Doppler as needed of all accessible portions of each vessel. Bilateral testing is considered an integral part of a complete examination. Limited examinations for reoccurring indications may be performed as noted. The reflux portion of the exam is performed with the patient in  reverse Trendelenburg.  +--------+---------------+---------+-----------+----------+--------------------+ RIGHT   CompressibilityPhasicitySpontaneityPropertiesThrombus Aging       +--------+---------------+---------+-----------+----------+--------------------+ CFV     Full           Yes      Yes                                       +--------+---------------+---------+-----------+----------+--------------------+ SFJ     Full                                                              +--------+---------------+---------+-----------+----------+--------------------+ FV Prox Full           Yes      Yes                                       +--------+---------------+---------+-----------+----------+--------------------+  FV Mid  Full           Yes      Yes                                       +--------+---------------+---------+-----------+----------+--------------------+ FV      Full           Yes      Yes                                       Distal                                                                    +--------+---------------+---------+-----------+----------+--------------------+ PFV     Full                                                              +--------+---------------+---------+-----------+----------+--------------------+ POP     Full           Yes      Yes                                       +--------+---------------+---------+-----------+----------+--------------------+ PTV                    Yes      Yes                  patent by                                                                 color/doppler        +--------+---------------+---------+-----------+----------+--------------------+ PERO    Full                                                              +--------+---------------+---------+-----------+----------+--------------------+    +---------+---------------+---------+-----------+----------+--------------+ LEFT     CompressibilityPhasicitySpontaneityPropertiesThrombus Aging +---------+---------------+---------+-----------+----------+--------------+ CFV      Full           Yes      Yes                                 +---------+---------------+---------+-----------+----------+--------------+ SFJ      Full                                                        +---------+---------------+---------+-----------+----------+--------------+  FV Prox  Full           Yes      Yes                                 +---------+---------------+---------+-----------+----------+--------------+ FV Mid   Full           Yes      Yes                                 +---------+---------------+---------+-----------+----------+--------------+ FV DistalFull           Yes      Yes                                 +---------+---------------+---------+-----------+----------+--------------+ PFV      Full                                                        +---------+---------------+---------+-----------+----------+--------------+ POP      Full           Yes      Yes                                 +---------+---------------+---------+-----------+----------+--------------+ PTV      Full                                                        +---------+---------------+---------+-----------+----------+--------------+ PERO     Full                                                        +---------+---------------+---------+-----------+----------+--------------+     Summary: BILATERAL: - No evidence of deep vein thrombosis seen in the lower extremities, bilaterally. -No evidence of popliteal cyst, bilaterally.   *See table(s) above for measurements and observations. Electronically signed by Waverly Ferrari MD on 01/06/2023 at 7:26:03 PM.    Final

## 2023-02-05 NOTE — Assessment & Plan Note (Addendum)
The plan will be to continue adjuvant treatment for 3 cycles before repeating imaging study I refilled her prescriptions today We discussed the importance of preventive care and reviewed the vaccination programs. She does not have any prior allergic reactions to influenza vaccination. She agrees to proceed with influenza vaccination today and we will administer it today at the clinic. I plan to order echocardiogram before her next treatment

## 2023-02-05 NOTE — Progress Notes (Signed)
Ok to treat w/ ANC 1.4 and ECHO from 6/20 (echo to be scheduled) per Dr. Dr Bertis Ruddy

## 2023-02-05 NOTE — Assessment & Plan Note (Signed)
Due to recent treatment She is not symptomatic We will proceed without delay

## 2023-02-05 NOTE — Patient Instructions (Signed)
[place] [facility]  Discharge Instructions: Thank you for choosing [facility] to provide your oncology and hematology care.   If you have a lab appointment with the Cancer Center, please go directly to the Cancer Center and check in at the registration area.   Wear comfortable clothing and clothing appropriate for easy access to any Portacath or PICC line.   We strive to give you quality time with your provider. You may need to reschedule your appointment if you arrive late (15 or more minutes).  Arriving late affects you and other patients whose appointments are after yours.  Also, if you miss three or more appointments without notifying the office, you may be dismissed from the clinic at the provider's discretion.      For prescription refill requests, have your pharmacy contact our office and allow 72 hours for refills to be completed.    Today you received the following chemotherapy and/or immunotherapy agents: Kanjinti/Taxol/Carboplatin     To help prevent nausea and vomiting after your treatment, we encourage you to take your nausea medication as directed.  BELOW ARE SYMPTOMS THAT SHOULD BE REPORTED IMMEDIATELY: *FEVER GREATER THAN 100.4 F (38 C) OR HIGHER *CHILLS OR SWEATING *NAUSEA AND VOMITING THAT IS NOT CONTROLLED WITH YOUR NAUSEA MEDICATION *UNUSUAL SHORTNESS OF BREATH *UNUSUAL BRUISING OR BLEEDING *URINARY PROBLEMS (pain or burning when urinating, or frequent urination) *BOWEL PROBLEMS (unusual diarrhea, constipation, pain near the anus) TENDERNESS IN MOUTH AND THROAT WITH OR WITHOUT PRESENCE OF ULCERS (sore throat, sores in mouth, or a toothache) UNUSUAL RASH, SWELLING OR PAIN  UNUSUAL VAGINAL DISCHARGE OR ITCHING   Items with * indicate a potential emergency and should be followed up as soon as possible or go to the Emergency Department if any problems should occur.  Please show the CHEMOTHERAPY ALERT CARD or IMMUNOTHERAPY  ALERT CARD at check-in to the Emergency Department and triage nurse.  Should you have questions after your visit or need to cancel or reschedule your appointment, please contact [place] [facility]  Dept: [phone]  and follow the prompts.  Office hours are 8:00 a.m. to 4:30 p.m. Monday - Friday. Please note that voicemails left after 4:00 p.m. may not be returned until the following business day.  We are closed weekends and major holidays. You have access to a nurse at all times for urgent questions. Please call the main number to the clinic Dept: [phone] and follow the prompts.   For any non-urgent questions, you may also contact your provider using MyChart. We now offer e-Visits for anyone 68 and older to request care online for non-urgent symptoms. For details visit [email or web address].   Also download the MyChart app! Go to the app store, search "MyChart", open the app, select [place], and log in with your MyChart username and password.

## 2023-02-06 ENCOUNTER — Inpatient Hospital Stay: Payer: Medicare Other | Admitting: Gynecologic Oncology

## 2023-02-06 DIAGNOSIS — C549 Malignant neoplasm of corpus uteri, unspecified: Secondary | ICD-10-CM

## 2023-02-08 ENCOUNTER — Other Ambulatory Visit: Payer: Self-pay

## 2023-02-12 ENCOUNTER — Encounter: Payer: Self-pay | Admitting: Oncology

## 2023-02-12 ENCOUNTER — Encounter: Payer: Self-pay | Admitting: Gynecologic Oncology

## 2023-02-12 ENCOUNTER — Inpatient Hospital Stay: Payer: Medicare Other | Admitting: Gynecologic Oncology

## 2023-02-12 VITALS — BP 137/72 | HR 66 | Resp 19 | Ht 62.0 in | Wt 176.8 lb

## 2023-02-12 DIAGNOSIS — Z7189 Other specified counseling: Secondary | ICD-10-CM

## 2023-02-12 DIAGNOSIS — Z90710 Acquired absence of both cervix and uterus: Secondary | ICD-10-CM

## 2023-02-12 DIAGNOSIS — Z90722 Acquired absence of ovaries, bilateral: Secondary | ICD-10-CM

## 2023-02-12 DIAGNOSIS — C541 Malignant neoplasm of endometrium: Secondary | ICD-10-CM

## 2023-02-12 DIAGNOSIS — C549 Malignant neoplasm of corpus uteri, unspecified: Secondary | ICD-10-CM

## 2023-02-12 NOTE — Progress Notes (Signed)
Faxed order requisition to [facility] for MI Profile testing on accession [phone].

## 2023-02-12 NOTE — Progress Notes (Signed)
Gynecologic Oncology Return Clinic Visit  02/12/23  Reason for Visit: follow-up  Treatment History: Oncology History Overview Note  MMR normal, Her2 positive High grade serous   Uterine cancer (HCC)  08/12/2022 Initial Diagnosis   She presented with PMB   09/24/2022 Pathology Results   Diagnosis 1. Endometrium, curettage - HIGH GRADE SEROUS CARCINOMA (SEE NOTE) 2. Endometrium, resection - HIGH GRADE SEROUS CARCINOMA (SEE NOTE) Diagnosis Note 1. - immunohistochemical stain reveal tumor cells are positive for p16. P53 shows strong expression consistent with mutant type. This case was reviewed with Dr. Reynolds Bowl who agrees with the diagnosis   10/14/2022 Imaging   1. Ill-defined fullness of the uterus and endometrial cavity without discretely visualized mass, in keeping with patient's known endometrial malignancy. 2. Enlarged right iliac and pelvic sidewall lymph nodes, as well as enlarged epicardial lymph nodes or soft tissue nodules. 3. Scattered peritoneal and omental soft tissue nodularity, particularly notable in the left upper quadrant. 4. Soft tissue matting about the distal sigmoid colon and rectum in the low pelvis. 5. Constellation of findings is consistent with nodal, peritoneal, and omental metastatic disease. 6. Small right pleural effusion, nonspecific although modestly suspicious for a malignant effusion, however without directly visualized pleural mass or nodularity. 7. Pancolonic diverticulosis, severe in the descending and sigmoid colon. 8. Cholelithiasis. 9. Coronary artery disease.   10/17/2022 Initial Diagnosis   Uterine cancer (HCC)   10/17/2022 Cancer Staging   Staging form: Corpus Uteri - Carcinoma and Carcinosarcoma, AJCC 8th Edition - Clinical stage from 10/17/2022: FIGO Stage IVB (cT1b, cN1, cM1) - Signed by Artis Delay, MD on 10/17/2022 Stage prefix: Initial diagnosis   10/23/2022 Echocardiogram   1. Left ventricular ejection fraction, by estimation, is 65 to  70%. Left ventricular ejection fraction by 2D MOD biplane is 68.1 %. The left ventricle has normal function. The left ventricle has no regional wall motion abnormalities. There is mild left ventricular hypertrophy. Left ventricular diastolic parameters are consistent with Grade I diastolic dysfunction (impaired relaxation).  2. Right ventricular systolic function is normal. The right ventricular size is normal. There is normal pulmonary artery systolic pressure. The estimated right ventricular systolic pressure is 27.6 mmHg.  3. The mitral valve is abnormal. Trivial mitral valve regurgitation.  4. The aortic valve is tricuspid. Aortic valve regurgitation is not visualized. Aortic valve sclerosis is present, with no evidence of aortic valve stenosis.  5. The inferior vena cava is normal in size with greater than 50% respiratory variability, suggesting right atrial pressure of 3 mmHg.   10/28/2022 -  Chemotherapy   Patient is on Treatment Plan : UTERINE SEROUS CARCINOMA Carboplatin + Paclitaxel + Trastuzumab q21d x 6 Cycles / Trastuzumab q21d     12/23/2022 Imaging   CT ABDOMEN PELVIS W CONTRAST  Result Date: 12/23/2022 CLINICAL DATA:  Follow-up endometrial cancer, status post chemotherapy EXAM: CT ABDOMEN AND PELVIS WITH CONTRAST TECHNIQUE: Multidetector CT imaging of the abdomen and pelvis was performed using the standard protocol following bolus administration of intravenous contrast. RADIATION DOSE REDUCTION: This exam was performed according to the departmental dose-optimization program which includes automated exposure control, adjustment of the mA and/or kV according to patient size and/or use of iterative reconstruction technique. CONTRAST:  75mL OMNIPAQUE IOHEXOL 350 MG/ML SOLN COMPARISON:  10/14/2022 FINDINGS: Lower chest: Small right pleural effusion. Associated bilateral lower lobe atelectasis. Hepatobiliary: Liver is within normal limits. Layering small gallstones (series 3/image 27), without  associated inflammatory changes. No intrahepatic or extrahepatic duct dilatation. Pancreas: Within normal limits. Spleen:  Within normal limits. Adrenals/Urinary Tract: Adrenal glands are within normal limits. Kidneys are within normal limits.  No hydronephrosis. Bladder is within normal limits. Stomach/Bowel: Stomach is within normal limits. No evidence of bowel obstruction. Normal appendix (series 3/image 46). Left colonic diverticulosis, without evidence of diverticulitis. Vascular/Lymphatic: No evidence of abdominal aortic aneurysm. Atherosclerotic calcifications of the abdominal aorta and branch vessels, although vessels remain patent. Small right iliac nodes, including a dominant 10 mm short axis right obturator node (series 3/image 56), previously 12 mm. Reproductive: Mildly heterogeneous uterus with endometrial thickening measuring up to 16 mm (series 7/image 33), corresponding to the patient's known endometrial cancer. No adnexal masses. Other: No abdominopelvic ascites. Mild peritoneal disease/omental caking beneath the left mid abdominal wall (series 3/image 45), mildly improved. Minimal perirectal soft tissue in the left lower pelvis (series 3/image 58), improved. Musculoskeletal: Degenerative changes of the visualized thoracolumbar spine. IMPRESSION: Mildly heterogeneous uterus with endometrial thickening, corresponding to the patient's known endometrial cancer. Small right pelvic nodal metastases, improved. Mild peritoneal disease/omental caking, improved Small right pleural effusion. Electronically Signed   By: Charline Bills M.D.   On: 12/23/2022 02:18      01/07/2023 Surgery   Robotic-assisted laparoscopic total hysterectomy with bilateral salpingo-oophorectomy, lysis of adhesions, tumor debulking including peritoneal stripping, omentectomy, mini laparotomy    01/07/2023 Pathology Results   CASE: [id number] PATIENT: Maria Avila Surgical Pathology Report  Reason for Addendum #1:  DNA  Mismatch Repair IHC Results  Clinical History: advanced uterine cancer     FINAL MICROSCOPIC DIAGNOSIS:  A. UTERUS, CERVIX, BILATERAL FALLOPIAN TUBES AND OVARIES, RESECTION: - High-grade serous carcinoma, involving the entire endometrial surface, see comment - Carcinoma invades for a depth of 1.6 cm myometrial thickness is 1.7 cm (> 90%) - Cervical stroma is involved by carcinoma - No evidence of lymphovascular invasion - Carcinoma involves surface of bilateral ovaries and left fallopian tube - Benign leiomyomata - See oncology table  B. POSTERIOR CUL DE SAC, EXCISION: - High-grade serous carcinoma with psammomatous calcifications  C. LEFT PELVIC SIDEWALL, EXCISION: - High-grade serous carcinoma with psammomatous calcifications  D. OMENTUM, EXCISION: - High-grade serous carcinoma with psammomatous calcifications  COMMENT:  Immunohistochemical stains for p53 and p16 are diffusely positive in the tumor cells (clonal overexpression pattern), consistent with above diagnosis.  Dr. Kenyon Ana reviewed the case and concurs with the above diagnosis.  ONCOLOGY TABLE:  UTERUS, CARCINOMA OR CARCINOSARCOMA: Resection  Procedure: Total hysterectomy and bilateral salpingo-oophorectomy Histologic Type: Serous carcinoma Histologic Grade: High-grade Myometrial Invasion:      Depth of Myometrial Invasion (mm): 16 mm      Myometrial Thickness (mm): 17 mm      Percentage of Myometrial Invasion: >90% Uterine Serosa Involvement: Not identified Cervical stromal Involvement: Present Extent of involvement of other tissue/organs: Bilateral ovaries, left fallopian tube and pelvic sidewall Peritoneal/Ascitic Fluid: Not applicable Lymphovascular Invasion: Not identified Regional Lymph Nodes: Not applicable (no lymph nodes submitted or found)  Distant Metastasis:      Distant Site(s) Involved: Omentum Pathologic Stage Classification (pTNM, AJCC 8th Edition): pT3a, pN not assigned Ancillary  Studies: MMR / MSI testing will be ordered    01/07/2023 Surgery   OPERATIVE NOTE  Pre-operative Diagnosis: 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  On intra-abdominal entry, normal upper abdominal survey.  Omentum adherent to the sigmoid epiploica and the descending colon.  5 mm tumor implant along the left pelvic brim.  Normal-appearing bilateral tubes and ovaries although the sigmoid colon and epiploica adherent to the medial aspect of the left adnexa and posterior uterus.  Miliary disease noted over the anterior aspect of the sigmoid colon, rectum, and posterior cul-de-sac peritoneum.  Some obliteration of the deep posterior cul-de-sac.  On rectal exam, approximately 7-8 cm above the anal verge, just superior to the cervicovaginal junction, there is some tethering of the rectum.  Some adhesions between the bladder and lower uterine segment.  Minimal pelvic ascites.  Medical comorbidities as well as what would have required a very low rectal resection and anastomosis to achieve complete cytoreduction with the need for a diverting ileostomy, decision made to remove the bulk of disease without bowel surgery.  Much of the pelvic peritoneum with miliary disease stripped within the cul-de-sac.     Interval History: Doing well.  Denies any significant abdominal pain.  Reports normal bowel and bladder function.  Denies any vaginal bleeding.  Restarted chemotherapy last week.  Past Medical/Surgical History: Past Medical History:  Diagnosis Date   Colitis    Endometrial cancer (HCC)    Hyperlipidemia    Hypertension     Malnutrition (HCC)    Stroke (HCC) 2019   balance still affected    Past Surgical History:  Procedure Laterality Date   BIOPSY  02/26/2018   Procedure: BIOPSY;  Surgeon: Charlott Rakes, MD;  Location: [facility] ENDOSCOPY;  Service: Endoscopy;;   COLONOSCOPY WITH PROPOFOL N/A 02/26/2018   Procedure: COLONOSCOPY WITH PROPOFOL;  Surgeon: Charlott Rakes, MD;  Location: [facility] ENDOSCOPY;  Service: Endoscopy;  Laterality: N/A;   ESOPHAGOGASTRODUODENOSCOPY (EGD) WITH PROPOFOL N/A 02/26/2018   Procedure: ESOPHAGOGASTRODUODENOSCOPY (EGD) WITH PROPOFOL;  Surgeon: Charlott Rakes, MD;  Location[facility] ENDOSCOPY;  Service: Endoscopy;  Laterality: N/A;   IR IMAGING GUIDED PORT INSERTION  10/21/2022   NO PAST SURGERIES     UNCERTAIN OF NAME OF SURGERY    Family History  Problem Relation Age of Onset   Alzheimer's disease Mother    Colon cancer Father    Endometrial cancer Sister        gyn, unsure type   Breast cancer Neg Hx    Ovarian cancer Neg Hx    Prostate cancer Neg Hx    Pancreatic cancer Neg Hx     Social History   Socioeconomic History   Marital status: Married    Spouse name: Not on file   Number of children: Not on file   Years of education: Not on file   Highest education level: Not on file  Occupational History   Not on file  Tobacco Use   Smoking status: Former    Current packs/day: 0.00    Types: Cigarettes    Quit date: 89    Years since quitting: 30.7   Smokeless tobacco: Never  Vaping Use   Vaping status: Never Used  Substance and Sexual Activity   Alcohol use: Yes    Comment: occ wine   Drug use: Not Currently   Sexual activity: Yes  Other Topics Concern   Not on file  Social History Narrative   Not on file   Social Determinants of Health   Financial Resource Strain: Not on file  Food Insecurity: No Food Insecurity (10/16/2022)   Hunger Vital Sign  Worried About Programme researcher, broadcasting/film/video in the Last Year: Never true    Ran Out of Food in the Last Year:  Never true  Transportation Needs: No Transportation Needs (10/16/2022)   PRAPARE - Administrator, Civil Service (Medical): No    Lack of Transportation (Non-Medical): No  Physical Activity: Not on file  Stress: Not on file  Social Connections: Not on file    Current Medications:  Current Outpatient Medications:    acetaminophen (TYLENOL) 325 MG tablet, Take 1-2 tablets (325-650 mg total) by mouth every 4 (four) hours as needed for mild pain., Disp: , Rfl:    apixaban (ELIQUIS) 2.5 MG TABS tablet, Take 1 tablet (2.5 mg total) by mouth 2 (two) times daily. Plan to start 01/10/23 (Patient not taking: Reported on 01/30/2023), Disp: 24 tablet, Rfl: 0   benazepril-hydrochlorthiazide (LOTENSIN HCT) 20-25 MG tablet, Take 1 tablet by mouth daily., Disp: , Rfl:    [employer] VITAMIN C 500 MG tablet, TAKE 1 TABLET BY MOUTH TWICE A DAY, Disp: 100 tablet, Rfl: 0   ezetimibe (ZETIA) 10 MG tablet, Take 10 mg by mouth daily., Disp: , Rfl:    lidocaine-prilocaine (EMLA) cream, Apply to affected area once (Patient taking differently: Apply 1 Application topically daily as needed (prior to port access).), Disp: 30 g, Rfl: 3   Multiple Vitamin (MULTIVITAMIN) capsule, Take 1 capsule by mouth daily., Disp: , Rfl:    ondansetron (ZOFRAN) 8 MG tablet, Take 1 tablet (8 mg total) by mouth every 8 (eight) hours as needed for nausea., Disp: 30 tablet, Rfl: 3   oxyCODONE (OXY IR/ROXICODONE) 5 MG immediate release tablet, Take 1 tablet (5 mg total) by mouth every 4 (four) hours as needed for severe pain., Disp: 30 tablet, Rfl: 0   prochlorperazine (COMPAZINE) 10 MG tablet, Take 1 tablet (10 mg total) by mouth every 6 (six) hours as needed for nausea or vomiting., Disp: 30 tablet, Rfl: 1   senna-docusate (SENOKOT-S) 8.6-50 MG tablet, Take 2 tablets by mouth at bedtime. Do not take if having diarrhea, Disp: 60 tablet, Rfl: 0   thiamine (VITAMIN B-1) 100 MG tablet, TAKE 1 TABLET BY MOUTH EVERY DAY, Disp: 30 tablet, Rfl:  0  Review of Systems: Denies appetite changes, fevers, chills, fatigue, unexplained weight changes. Denies hearing loss, neck lumps or masses, mouth sores, ringing in ears or voice changes. Denies cough or wheezing.  Denies shortness of breath. Denies chest pain or palpitations. Denies leg swelling. Denies abdominal distention, pain, blood in stools, constipation, diarrhea, nausea, vomiting, or early satiety. Denies pain with intercourse, dysuria, frequency, hematuria or incontinence. Denies hot flashes, pelvic pain, vaginal bleeding or vaginal discharge.   Denies joint pain, back pain or muscle pain/cramps. Denies itching, rash, or wounds. Denies dizziness, headaches, numbness or seizures. Denies swollen lymph nodes or glands, denies easy bruising or bleeding. Denies anxiety, depression, confusion, or decreased concentration.  Physical Exam: BP 137/72   Pulse 66   Resp 19   Ht 5\' 2"  (1.575 m)   Wt 176 lb 12.8 oz (80.2 kg)   SpO2 100%   BMI 32.34 kg/m  General: Alert, oriented, no acute distress. HEENT: Normocephalic, atraumatic, sclera anicteric. Chest: Unlabored breathing on room air. Abdomen: soft, nontender.  Normoactive bowel sounds.  No masses or hepatosplenomegaly appreciated.  Well-healed incisions. Extremities: Grossly normal range of motion.  Warm, well perfused.  No edema bilaterally. GU: Normal appearing external genitalia without erythema, excoriation, or lesions.  Speculum exam reveals mildly atrophic  tissue.  Cuff intact with suture visible, no bleeding or discharge.  Bimanual exam reveals cuff intact, no fluctuance or tenderness to palpation.    Laboratory & Radiologic Studies: A. UTERUS, CERVIX, BILATERAL FALLOPIAN TUBES AND OVARIES, RESECTION: - High-grade serous carcinoma, involving the entire endometrial surface, see comment - Carcinoma invades for a depth of 1.6 cm myometrial thickness is 1.7 cm (> 90%) - Cervical stroma is involved by carcinoma - No  evidence of lymphovascular invasion - Carcinoma involves surface of bilateral ovaries and left fallopian tube - Benign leiomyomata - See oncology table  B. POSTERIOR CUL DE SAC, EXCISION: - High-grade serous carcinoma with psammomatous calcifications  C. LEFT PELVIC SIDEWALL, EXCISION: - High-grade serous carcinoma with psammomatous calcifications  D. OMENTUM, EXCISION: - High-grade serous carcinoma with psammomatous calcifications  COMMENT:  Immunohistochemical stains for p53 and p16 are diffusely positive in the tumor cells (clonal overexpression pattern), consistent with above diagnosis.  Dr. Kenyon Ana reviewed the case and concurs with the above diagnosis.  ONCOLOGY TABLE:  UTERUS, CARCINOMA OR CARCINOSARCOMA: Resection  Procedure: Total hysterectomy and bilateral salpingo-oophorectomy Histologic Type: Serous carcinoma Histologic Grade: High-grade Myometrial Invasion:      Depth of Myometrial Invasion (mm): 16 mm      Myometrial Thickness (mm): 17 mm      Percentage of Myometrial Invasion: >90% Uterine Serosa Involvement: Not identified Cervical stromal Involvement: Present Extent of involvement of other tissue/organs: Bilateral ovaries, left fallopian tube and pelvic sidewall Peritoneal/Ascitic Fluid: Not applicable Lymphovascular Invasion: Not identified Regional Lymph Nodes: Not applicable (no lymph nodes submitted or found)  Distant Metastasis:      Distant Site(s) Involved: Omentum Pathologic Stage Classification (pTNM, AJCC 8th Edition): pT3a, pN not assigned Ancillary Studies: MMR / MSI testing will be ordered Representative Tumor Block: A12   Assessment & Plan: Maria Avila is a 72 y.o. 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 P53 abn, MMRp. HER2+.    Patient is overall doing very well from a postoperative standpoint, meeting all milestones.  Discussed continued expectations and  restrictions.  Given cervical stromal involvement and high-grade histology, discussed consultation with radiation oncology.  I am concerned she would not tolerate pelvic radiation after she finishes chemotherapy, but I think vaginal brachytherapy would be very reasonable to help decrease her risk of recurrence at the vaginal cuff.  Clydie Braun will help facilitate getting this scheduled.  Discussed plan to send tumor for somatic testing.  Patient will complete adjuvant chemotherapy with Herceptin with plan for Herceptin maintenance.  18 minutes of total time was spent for this patient encounter, including preparation, face-to-face counseling with the patient and coordination of care, and documentation of the encounter.  Eugene Garnet, MD  Division of Gynecologic Oncology  Department of Obstetrics and Gynecology  [facility]

## 2023-02-12 NOTE — Progress Notes (Signed)
Referral placed to radiation oncology for vaginal brachytherapy.

## 2023-02-12 NOTE — Patient Instructions (Signed)
It was good to see you today.  I am glad you are healing well from surgery.  Please remember, no heavy lifting for 6 weeks after surgery and nothing in the vagina for 10 weeks.  As we discussed, I am putting in a consultation for you to meet with one of our radiation oncologist to at least discuss some local radiation to the top of the vagina as you are completing your chemotherapy.  If you have any vaginal bleeding, abdominal pain, or other concerning symptoms, please call to schedule a visit to see me.

## 2023-02-13 ENCOUNTER — Encounter: Payer: Self-pay | Admitting: Hematology and Oncology

## 2023-02-13 NOTE — Progress Notes (Signed)
Received call from patient's spouse regarding [employer]. Advised patient was approved the same day paperwork was completed and they should have a green folder with the information inside. He found the green folder and I went over the expense sheet in detail and how it works. He seemed to verbalize understanding and states he did.  He has my card for any additional financial questions or concerns.

## 2023-02-14 ENCOUNTER — Other Ambulatory Visit: Payer: Self-pay

## 2023-02-16 DIAGNOSIS — C7989 Secondary malignant neoplasm of other specified sites: Secondary | ICD-10-CM | POA: Diagnosis not present

## 2023-02-16 DIAGNOSIS — C7982 Secondary malignant neoplasm of genital organs: Secondary | ICD-10-CM | POA: Diagnosis not present

## 2023-02-16 DIAGNOSIS — C786 Secondary malignant neoplasm of retroperitoneum and peritoneum: Secondary | ICD-10-CM | POA: Diagnosis not present

## 2023-02-17 NOTE — Progress Notes (Signed)
Radiation Oncology         [phone] ________________________________  Initial Outpatient Consultation  Name: Maria Avila MRN: [id number]  Date: 02/18/2023  DOB: 1950/08/12  PI:RJJOA, Zollie Beckers, MD  Carver Fila, MD   REFERRING PHYSICIAN: Carver Fila, MD  DIAGNOSIS: {There were no encounter diagnoses. 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 female who is accompanied by ***. she is seen as a courtesy of Dr. Pricilla Holm for an opinion concerning radiation therapy as part of management for her recently diagnosed endometrial cancer.   She initially developed postmenopausal bleeding and mild cramping in December 2023. She continued to have spotting prompting a pelvic US on 08/19/22 which showed: a uterus measuring 7.6 x 4.1 x 3.8 cm and an intracavitary lesion measuring 3.2 cm. (Normal appearing bilateral ovaries were also noted).   An endometrial biopsy was subsequently performed on 09/24/22 which showed high grade serous carcinoma; p53 positive.   CT CAP on 10/14/22 demonstrated an ill-defined fullness of the uterus and endometrial cavity without discretely visualized mass, correlating with patient's known endometrial malignancy. Evidence of nodal, peritoneal, and omental metastatic disease was also demonstrated, characterized by: enlarged right iliac and pelvic sidewall lymph nodes;  enlarged epicardial lymph nodes or soft tissue nodules; scattered peritoneal and omental soft tissue nodularities, most notable in the left upper quadrant; and soft tissue matting about the distal sigmoid colon and rectum in the lower pelvis. Other findings included a nonspecific small right pleural effusion which was modestly suspicious for a malignant effusion, though without directly visualized pleural mass or nodularity.  She was accordingly seen in consultation by Dr. Pricilla Holm on 10/17/22 for further evaluation and management. Based on her CT showing evidence metastatic disease, Dr. Pricilla Holm recommended proceeding with 3 cycles of neoadjuvant therapy, followed by interval imaging to determine the role of surgical intervention.   She was then referred to Dr. Bertis Ruddy to initiate chemotherapy. Further molecular studies were ordered which showed a positive Her2 status. Based on this result, she received 3 cycles neoadjuvant chemotherapy consisting of carboplatin, paclitaxel and trastuzumab from 10/28/22 thorough 12/09/22. She tolerated systemic treatment very well overall except for anemia and arthralgias which would last for a few days after each treatment (managed with oxycodone).    Restaging CT CAP on 12/17/22 showed evidence of a treatment response, demonstrated by: interval improvement in the right pelvic nodal metastases and mild peritoneal disease/omental caking. Imaging also demonstrated the known small right pleural effusion, and a mildly heterogeneous uterus with endometrial thickening measuring up to 16 mm, corresponding to the patient's known endometrial cancer.  In light of her good treatment response, she qualified for surgery and opted to proceed with a total hysterectomy, BSO, omentectomy, and debulking on 01/07/23 under the care of Dr. Pricilla Holm. Pathology from the procedure revealed: high grade serous carcinoma involving the entire endometrial surface with >90% myometrial invasion; cervical  stroma involved by carcinoma; MMR normal; p53 and p16 positive; negative for LVI; surface of the  bilateral ovaries and left fallopian tube involved by carcinoma; omentum, left pelvic side wall, and posterior cul de sac excisions positive for carcinoma.   She is planned to receive 3 more cycles of chemotherapy and received her most recent cycle on 02/05/23. After her 6th and final cycle, she will continue with herceptin maintenance therapy.   Given her cervical stromal involvement and high-grade histology, Dr. Pricilla Holm has recommended radiation therapy to decrease her risk of recurrence. Dr. Pricilla Holm is concerned that she may not tolerate pelvic radiation well after completing chemotherapy, and recommends vaginal brachytherapy alone which we will discuss in detail today.    PREVIOUS RADIATION THERAPY: No  PAST MEDICAL HISTORY:  Past Medical History:  Diagnosis Date   Colitis    Endometrial cancer (HCC)    Hyperlipidemia    Hypertension    Malnutrition (HCC)    Stroke (HCC) 2019   balance still affected    PAST SURGICAL HISTORY: Past Surgical History:  Procedure Laterality Date   BIOPSY  02/26/2018   Procedure: BIOPSY;  Surgeon: Charlott Rakes, MD;  Location: [facility] ENDOSCOPY;  Service: Endoscopy;;   COLONOSCOPY WITH PROPOFOL N/A 02/26/2018   Procedure: COLONOSCOPY WITH PROPOFOL;  Surgeon: Charlott Rakes, MD;  Location: [facility] ENDOSCOPY;  Service: Endoscopy;  Laterality: N/A;   ESOPHAGOGASTRODUODENOSCOPY (EGD) WITH PROPOFOL N/A 02/26/2018   Procedure: ESOPHAGOGASTRODUODENOSCOPY (EGD) WITH PROPOFOL;  Surgeon: Charlott Rakes, MD;  Location: [facility] ENDOSCOPY;  Service: Endoscopy;  Laterality: N/A;   IR IMAGING GUIDED PORT INSERTION  10/21/2022   NO PAST SURGERIES     UNCERTAIN OF NAME OF SURGERY    FAMILY HISTORY:  Family History  Problem Relation Age of Onset   Alzheimer's disease Mother    Colon cancer Father    Endometrial cancer Sister        gyn, unsure type   Breast cancer Neg Hx     Ovarian cancer Neg Hx    Prostate cancer Neg Hx    Pancreatic cancer Neg Hx     SOCIAL HISTORY:  Social History   Tobacco Use   Smoking status: Former    Current packs/day: 0.00    Types: Cigarettes    Quit date: 1994    Years since quitting: 30.8   Smokeless tobacco: Never  Vaping Use   Vaping status: Never Used  Substance Use Topics   Alcohol use: Yes    Comment: occ wine   Drug use: Not Currently    ALLERGIES:  Allergies  Allergen Reactions   Crestor [Rosuvastatin Calcium] Swelling and Other (See Comments)    Swelling of legs and muscle weakness   Zocor [Simvastatin] Swelling and Other (See Comments)    Swelling of legs and muscle weakness    MEDICATIONS:  Current Outpatient Medications  Medication Sig Dispense Refill   acetaminophen (TYLENOL) 325 MG tablet Take 1-2 tablets (325-650 mg total) by mouth every 4 (four) hours as needed for mild pain.     apixaban (ELIQUIS) 2.5 MG TABS tablet Take 1 tablet (2.5 mg total) by mouth 2 (two) times daily. Plan to start 01/10/23 (Patient not taking: Reported on 01/30/2023) 24 tablet 0   benazepril-hydrochlorthiazide (LOTENSIN HCT) 20-25 MG tablet Take 1 tablet by mouth daily.     [employer] VITAMIN C 500 MG tablet TAKE 1 TABLET BY MOUTH TWICE A DAY 100 tablet 0   ezetimibe (ZETIA) 10 MG tablet Take 10 mg by mouth daily.     lidocaine-prilocaine (EMLA) cream Apply to affected area once (Patient taking differently: Apply 1  Application topically daily as needed (prior to port access).) 30 g 3   Multiple Vitamin (MULTIVITAMIN) capsule Take 1 capsule by mouth daily.     ondansetron (ZOFRAN) 8 MG tablet Take 1 tablet (8 mg total) by mouth every 8 (eight) hours as needed for nausea. 30 tablet 3   oxyCODONE (OXY IR/ROXICODONE) 5 MG immediate release tablet Take 1 tablet (5 mg total) by mouth every 4 (four) hours as needed for severe pain. 30 tablet 0   prochlorperazine (COMPAZINE) 10 MG tablet Take 1 tablet (10 mg total) by mouth every 6 (six)  hours as needed for nausea or vomiting. 30 tablet 1   senna-docusate (SENOKOT-S) 8.6-50 MG tablet Take 2 tablets by mouth at bedtime. Do not take if having diarrhea 60 tablet 0   thiamine (VITAMIN B-1) 100 MG tablet TAKE 1 TABLET BY MOUTH EVERY DAY 30 tablet 0   No current facility-administered medications for this encounter.    REVIEW OF SYSTEMS:  A 10+ POINT REVIEW OF SYSTEMS WAS OBTAINED including neurology, dermatology, psychiatry, cardiac, respiratory, lymph, extremities, GI, GU, musculoskeletal, constitutional, reproductive, HEENT. ***   PHYSICAL EXAM:  vitals were not taken for this visit.   General: Alert and oriented, in no acute distress HEENT: Head is normocephalic. Extraocular movements are intact. Oropharynx is clear. Neck: Neck is supple, no palpable cervical or supraclavicular lymphadenopathy. Heart: Regular in rate and rhythm with no murmurs, rubs, or gallops. Chest: Clear to auscultation bilaterally, with no rhonchi, wheezes, or rales. Abdomen: Soft, nontender, nondistended, with no rigidity or guarding. Extremities: No cyanosis or edema. Lymphatics: see Neck Exam Skin: No concerning lesions. Musculoskeletal: symmetric strength and muscle tone throughout. Neurologic: Cranial nerves II through XII are grossly intact. No obvious focalities. Speech is fluent. Coordination is intact. Psychiatric: Judgment and insight are intact. Affect is appropriate.  On pelvic examination the external genitalia were unremarkable. A speculum exam was performed. There are no mucosal lesions noted in the vaginal vault. A Pap smear was obtained of the proximal vagina. On bimanual and rectovaginal examination there were no pelvic masses appreciated. ***   ECOG = ***  0 - Asymptomatic (Fully active, able to carry on all predisease activities without restriction)  1 - Symptomatic but completely ambulatory (Restricted in physically strenuous activity but ambulatory and able to carry out work of a  light or sedentary nature. For example, light housework, office work)  2 - Symptomatic, <50% in bed during the day (Ambulatory and capable of all self care but unable to carry out any work activities. Up and about more than 50% of waking hours)  3 - Symptomatic, >50% in bed, but not bedbound (Capable of only limited self-care, confined to bed or chair 50% or more of waking hours)  4 - Bedbound (Completely disabled. Cannot carry on any self-care. Totally confined to bed or chair)  5 - Death   Santiago Glad MM, Creech RH, Tormey DC, et al. [phone]). "Toxicity and response criteria of the [facility]". Am. Evlyn Clines. Oncol. 5 (6): 649-55  LABORATORY DATA:  Lab Results  Component Value Date   WBC 4.3 02/05/2023   HGB 10.6 (L) 02/05/2023   HCT 30.2 (L) 02/05/2023   MCV 97.4 02/05/2023   PLT 176 02/05/2023   NEUTROABS 1.4 (L) 02/05/2023   Lab Results  Component Value Date   NA 141 02/05/2023   K 3.4 (L) 02/05/2023   CL 105 02/05/2023   CO2 30 02/05/2023   GLUCOSE 118 (H) 02/05/2023  BUN 18 02/05/2023   CREATININE 0.81 02/05/2023   CALCIUM 10.1 02/05/2023      RADIOGRAPHY: No results found.    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  We discussed the natural history of *** and general treatment, highlighting the role of radiotherapy in the management.  We discussed the available radiation techniques, and focused on the details of logistics and delivery.  We reviewed the anticipated acute and late sequelae associated with radiation in this setting.  The patient was encouraged to ask questions  that I answered to the best of my ability. *** A patient consent form was discussed and signed.  We retained a copy for our records.  The patient would like to proceed with radiation and will be scheduled for CT simulation.  PLAN: ***    *** minutes of total time was spent for this patient encounter, including preparation, face-to-face counseling with the patient and coordination of care, physical exam, and documentation of the encounter.   ------------------------------------------------  Billie Lade, PhD, MD  This document serves as a record of services personally performed by Antony Blackbird, MD. It was created on his behalf by Neena Rhymes, a trained medical scribe. The creation of this record is based on the scribe's personal observations and the provider's statements to them. This document has been checked and approved by the attending provider.

## 2023-02-17 NOTE — Progress Notes (Incomplete)
GYN Location of Tumor / Histology: Uterine   Maria Avila presented with symptoms of: {symptoms; gyn cyclic:13153::"bleeding"}  Biopsies revealed:     Past/Anticipated interventions by Gyn/Onc surgery, if any:    Past/Anticipated interventions by medical oncology, if any:  Dr. Bertis Ruddy     Weight changes, if any: {:18581}  Bowel/Bladder complaints, if any: {yes no:314532}, {Blank single:19197::"diarrhea","constipation","urinary frequency","burning","trouble emptying bladder"," "}  Nausea/Vomiting, if any: {:18581}  Pain issues, if any:  {:18581}  SAFETY ISSUES: Prior radiation? {:18581} Pacemaker/ICD? {:18581} Possible current pregnancy? {:18581} Is the patient on methotrexate? {:18581}  Current Complaints / other details:  ***

## 2023-02-18 ENCOUNTER — Ambulatory Visit
Admission: RE | Admit: 2023-02-18 | Discharge: 2023-02-18 | Disposition: A | Discharge status: Home / Self Care | Payer: Medicare Other | Source: Ambulatory Visit | Attending: Radiation Oncology | Admitting: Radiation Oncology

## 2023-02-18 ENCOUNTER — Encounter: Payer: Self-pay | Admitting: Radiation Oncology

## 2023-02-18 VITALS — BP 148/79 | HR 69 | Temp 96.9°F | Resp 18 | Ht 62.0 in | Wt 180.1 lb

## 2023-02-18 DIAGNOSIS — Z87891 Personal history of nicotine dependence: Secondary | ICD-10-CM | POA: Insufficient documentation

## 2023-02-18 DIAGNOSIS — C55 Malignant neoplasm of uterus, part unspecified: Secondary | ICD-10-CM | POA: Insufficient documentation

## 2023-02-18 DIAGNOSIS — I10 Essential (primary) hypertension: Secondary | ICD-10-CM | POA: Insufficient documentation

## 2023-02-18 DIAGNOSIS — C786 Secondary malignant neoplasm of retroperitoneum and peritoneum: Secondary | ICD-10-CM | POA: Diagnosis not present

## 2023-02-18 DIAGNOSIS — Z90722 Acquired absence of ovaries, bilateral: Secondary | ICD-10-CM | POA: Insufficient documentation

## 2023-02-18 DIAGNOSIS — M545 Low back pain, unspecified: Secondary | ICD-10-CM | POA: Diagnosis not present

## 2023-02-18 DIAGNOSIS — E785 Hyperlipidemia, unspecified: Secondary | ICD-10-CM | POA: Diagnosis not present

## 2023-02-18 DIAGNOSIS — Z90710 Acquired absence of both cervix and uterus: Secondary | ICD-10-CM | POA: Insufficient documentation

## 2023-02-18 DIAGNOSIS — Z800 Family history of malignant neoplasm of digestive organs: Secondary | ICD-10-CM | POA: Insufficient documentation

## 2023-02-18 DIAGNOSIS — Z809 Family history of malignant neoplasm, unspecified: Secondary | ICD-10-CM | POA: Diagnosis not present

## 2023-02-18 DIAGNOSIS — C779 Secondary and unspecified malignant neoplasm of lymph node, unspecified: Secondary | ICD-10-CM | POA: Insufficient documentation

## 2023-02-18 DIAGNOSIS — Z79899 Other long term (current) drug therapy: Secondary | ICD-10-CM | POA: Insufficient documentation

## 2023-02-18 DIAGNOSIS — Z9221 Personal history of antineoplastic chemotherapy: Secondary | ICD-10-CM | POA: Insufficient documentation

## 2023-02-18 DIAGNOSIS — E46 Unspecified protein-calorie malnutrition: Secondary | ICD-10-CM | POA: Diagnosis not present

## 2023-02-18 DIAGNOSIS — C541 Malignant neoplasm of endometrium: Secondary | ICD-10-CM

## 2023-02-18 DIAGNOSIS — Z8673 Personal history of transient ischemic attack (TIA), and cerebral infarction without residual deficits: Secondary | ICD-10-CM | POA: Insufficient documentation

## 2023-02-18 DIAGNOSIS — Z7901 Long term (current) use of anticoagulants: Secondary | ICD-10-CM | POA: Insufficient documentation

## 2023-02-20 ENCOUNTER — Ambulatory Visit (HOSPITAL_COMMUNITY)
Admission: RE | Admit: 2023-02-20 | Discharge: 2023-02-20 | Disposition: A | Discharge status: Home / Self Care | Payer: Medicare Other | Source: Ambulatory Visit | Attending: Hematology and Oncology | Admitting: Hematology and Oncology

## 2023-02-20 DIAGNOSIS — Z0189 Encounter for other specified special examinations: Secondary | ICD-10-CM | POA: Diagnosis not present

## 2023-02-20 DIAGNOSIS — I10 Essential (primary) hypertension: Secondary | ICD-10-CM | POA: Diagnosis not present

## 2023-02-20 DIAGNOSIS — R0600 Dyspnea, unspecified: Secondary | ICD-10-CM | POA: Diagnosis not present

## 2023-02-20 DIAGNOSIS — Z5111 Encounter for antineoplastic chemotherapy: Secondary | ICD-10-CM | POA: Diagnosis not present

## 2023-02-20 DIAGNOSIS — C55 Malignant neoplasm of uterus, part unspecified: Secondary | ICD-10-CM | POA: Diagnosis present

## 2023-02-20 DIAGNOSIS — Z8673 Personal history of transient ischemic attack (TIA), and cerebral infarction without residual deficits: Secondary | ICD-10-CM | POA: Diagnosis not present

## 2023-02-20 LAB — ECHOCARDIOGRAM COMPLETE
Area-P 1/2: 3.99 cm2
Calc EF: 65.3 %
S' Lateral: 2.5 cm
Single Plane A2C EF: 62.5 %
Single Plane A4C EF: 65.8 %

## 2023-02-20 NOTE — Progress Notes (Signed)
  Echocardiogram 2D Echocardiogram has been performed.  Maria Avila 02/20/2023, 11:56 AM

## 2023-02-26 ENCOUNTER — Inpatient Hospital Stay: Payer: Medicare Other | Admitting: Hematology and Oncology

## 2023-02-26 ENCOUNTER — Other Ambulatory Visit: Payer: Self-pay

## 2023-02-26 ENCOUNTER — Encounter: Payer: Self-pay | Admitting: Hematology and Oncology

## 2023-02-26 ENCOUNTER — Inpatient Hospital Stay: Payer: Medicare Other

## 2023-02-26 VITALS — BP 152/84 | HR 59 | Temp 97.7°F | Resp 16

## 2023-02-26 VITALS — BP 149/87 | HR 61 | Temp 98.6°F | Resp 18 | Ht 62.0 in | Wt 180.4 lb

## 2023-02-26 DIAGNOSIS — Z5112 Encounter for antineoplastic immunotherapy: Secondary | ICD-10-CM | POA: Diagnosis not present

## 2023-02-26 DIAGNOSIS — Z5111 Encounter for antineoplastic chemotherapy: Secondary | ICD-10-CM | POA: Diagnosis not present

## 2023-02-26 DIAGNOSIS — C775 Secondary and unspecified malignant neoplasm of intrapelvic lymph nodes: Secondary | ICD-10-CM | POA: Diagnosis not present

## 2023-02-26 DIAGNOSIS — D61818 Other pancytopenia: Secondary | ICD-10-CM

## 2023-02-26 DIAGNOSIS — C55 Malignant neoplasm of uterus, part unspecified: Secondary | ICD-10-CM

## 2023-02-26 DIAGNOSIS — Z87891 Personal history of nicotine dependence: Secondary | ICD-10-CM | POA: Diagnosis not present

## 2023-02-26 DIAGNOSIS — Z23 Encounter for immunization: Secondary | ICD-10-CM | POA: Diagnosis not present

## 2023-02-26 DIAGNOSIS — C786 Secondary malignant neoplasm of retroperitoneum and peritoneum: Secondary | ICD-10-CM | POA: Diagnosis not present

## 2023-02-26 DIAGNOSIS — Z8673 Personal history of transient ischemic attack (TIA), and cerebral infarction without residual deficits: Secondary | ICD-10-CM | POA: Diagnosis not present

## 2023-02-26 DIAGNOSIS — R634 Abnormal weight loss: Secondary | ICD-10-CM | POA: Diagnosis not present

## 2023-02-26 DIAGNOSIS — Z1731 Human epidermal growth factor receptor 2 positive status: Secondary | ICD-10-CM | POA: Diagnosis not present

## 2023-02-26 LAB — CMP (CANCER CENTER ONLY)
ALT: 14 U/L (ref 0–44)
AST: 16 U/L (ref 15–41)
Albumin: 4.2 g/dL (ref 3.5–5.0)
Alkaline Phosphatase: 56 U/L (ref 38–126)
Anion gap: 9 (ref 5–15)
BUN: 15 mg/dL (ref 8–23)
CO2: 25 mmol/L (ref 22–32)
Calcium: 9.6 mg/dL (ref 8.9–10.3)
Chloride: 104 mmol/L (ref 98–111)
Creatinine: 0.68 mg/dL (ref 0.44–1.00)
GFR, Estimated: >60 mL/min (ref >60)
Glucose, Bld: 93 mg/dL (ref 70–99)
Potassium: 3.5 mmol/L (ref 3.5–5.1)
Sodium: 138 mmol/L (ref 135–145)
Total Bilirubin: 0.5 mg/dL (ref 0.3–1.2)
Total Protein: 7.5 g/dL (ref 6.5–8.1)

## 2023-02-26 LAB — CBC WITH DIFFERENTIAL (CANCER CENTER ONLY)
Abs Immature Granulocytes: 0.01 10*3/uL (ref 0.00–0.07)
Basophils Absolute: 0 10*3/uL (ref 0.0–0.1)
Basophils Relative: 0 %
Eosinophils Absolute: 0.1 10*3/uL (ref 0.0–0.5)
Eosinophils Relative: 2 %
HCT: 28.5 % — ABNORMAL LOW (ref 36.0–46.0)
Hemoglobin: 9.8 g/dL — ABNORMAL LOW (ref 12.0–15.0)
Immature Granulocytes: 0 %
Lymphocytes Relative: 59 %
Lymphs Abs: 2.4 10*3/uL (ref 0.7–4.0)
MCH: 34.3 pg — ABNORMAL HIGH (ref 26.0–34.0)
MCHC: 34.4 g/dL (ref 30.0–36.0)
MCV: 99.7 fL (ref 80.0–100.0)
Monocytes Absolute: 0.3 10*3/uL (ref 0.1–1.0)
Monocytes Relative: 8 %
Neutro Abs: 1.2 10*3/uL — ABNORMAL LOW (ref 1.7–7.7)
Neutrophils Relative %: 31 %
Platelet Count: 129 10*3/uL — ABNORMAL LOW (ref 150–400)
RBC: 2.86 MIL/uL — ABNORMAL LOW (ref 3.87–5.11)
RDW: 14.5 % (ref 11.5–15.5)
WBC Count: 4 10*3/uL (ref 4.0–10.5)
nRBC: 0 % (ref 0.0–0.2)

## 2023-02-26 MED ORDER — SODIUM CHLORIDE 0.9 % IV SOLN
150.0000 mg | Freq: Once | INTRAVENOUS | Status: AC
  Administered 2023-02-26: 150 mg via INTRAVENOUS
  Filled 2023-02-26: qty 150

## 2023-02-26 MED ORDER — SODIUM CHLORIDE 0.9% FLUSH
10.0000 mL | INTRAVENOUS | Status: DC | PRN
  Administered 2023-02-26: 10 mL

## 2023-02-26 MED ORDER — DEXAMETHASONE SODIUM PHOSPHATE 10 MG/ML IJ SOLN
10.0000 mg | Freq: Once | INTRAMUSCULAR | Status: AC
  Administered 2023-02-26: 10 mg via INTRAVENOUS
  Filled 2023-02-26: qty 1

## 2023-02-26 MED ORDER — PALONOSETRON HCL INJECTION 0.25 MG/5ML
0.2500 mg | Freq: Once | INTRAVENOUS | Status: AC
  Administered 2023-02-26: 0.25 mg via INTRAVENOUS
  Filled 2023-02-26: qty 5

## 2023-02-26 MED ORDER — SODIUM CHLORIDE 0.9 % IV SOLN: Freq: Once | INTRAVENOUS | Status: AC

## 2023-02-26 MED ORDER — ACETAMINOPHEN 325 MG PO TABS
650.0000 mg | ORAL_TABLET | Freq: Once | ORAL | Status: AC
  Administered 2023-02-26: 650 mg via ORAL
  Filled 2023-02-26: qty 2

## 2023-02-26 MED ORDER — HEPARIN SOD (PORK) LOCK FLUSH 100 UNIT/ML IV SOLN
500.0000 [IU] | Freq: Once | INTRAVENOUS | Status: AC | PRN
  Administered 2023-02-26: 500 [IU]

## 2023-02-26 MED ORDER — FAMOTIDINE IN NACL 20-0.9 MG/50ML-% IV SOLN
20.0000 mg | Freq: Once | INTRAVENOUS | Status: AC
Start: 2023-02-26 — End: 2023-02-26
  Administered 2023-02-26: 20 mg via INTRAVENOUS
  Filled 2023-02-26: qty 50

## 2023-02-26 MED ORDER — CETIRIZINE HCL 10 MG/ML IV SOLN
10.0000 mg | Freq: Once | INTRAVENOUS | Status: AC
Start: 2023-02-26 — End: 2023-02-26
  Administered 2023-02-26: 10 mg via INTRAVENOUS
  Filled 2023-02-26: qty 1

## 2023-02-26 MED ORDER — SODIUM CHLORIDE 0.9% FLUSH
10.0000 mL | Freq: Once | INTRAVENOUS | Status: AC
  Administered 2023-02-26: 10 mL

## 2023-02-26 MED ORDER — SODIUM CHLORIDE 0.9 % IV SOLN
448.0000 mg | Freq: Once | INTRAVENOUS | Status: AC
  Administered 2023-02-26: 450 mg via INTRAVENOUS
  Filled 2023-02-26: qty 45

## 2023-02-26 MED ORDER — SODIUM CHLORIDE 0.9 % IV SOLN
175.0000 mg/m2 | Freq: Once | INTRAVENOUS | Status: AC
  Administered 2023-02-26: 330 mg via INTRAVENOUS
  Filled 2023-02-26: qty 55

## 2023-02-26 MED ORDER — TRASTUZUMAB-ANNS CHEMO 150 MG IV SOLR
6.0000 mg/kg | Freq: Once | INTRAVENOUS | Status: AC
  Administered 2023-02-26: 483 mg via INTRAVENOUS
  Filled 2023-02-26: qty 23

## 2023-02-26 NOTE — Assessment & Plan Note (Signed)
The plan will be to continue adjuvant treatment for 3 cycles before repeating imaging study, due in December She has progressive mild pancytopenia and I recommend not to start radiation until she completes the chemotherapy next month Will proceed with treatment without delay I will adjust the dose of her chemotherapy due to recent weight loss

## 2023-02-26 NOTE — Patient Instructions (Signed)
[facility]  Discharge Instructions: Thank you for choosing [facility] to provide your oncology and hematology care.   If you have a lab appointment with the Cancer Center, please go directly to the Cancer Center and check in at the registration area.   Wear comfortable clothing and clothing appropriate for easy access to any Portacath or PICC line.   We strive to give you quality time with your provider. You may need to reschedule your appointment if you arrive late (15 or more minutes).  Arriving late affects you and other patients whose appointments are after yours.  Also, if you miss three or more appointments without notifying the office, you may be dismissed from the clinic at the provider's discretion.      For prescription refill requests, have your pharmacy contact our office and allow 72 hours for refills to be completed.    Today you received the following chemotherapy and/or immunotherapy agents: Trastuzumab (Kanjinti), Paclitaxel, and Carboplatin      To help prevent nausea and vomiting after your treatment, we encourage you to take your nausea medication as directed.  BELOW ARE SYMPTOMS THAT SHOULD BE REPORTED IMMEDIATELY: *FEVER GREATER THAN 100.4 F (38 C) OR HIGHER *CHILLS OR SWEATING *NAUSEA AND VOMITING THAT IS NOT CONTROLLED WITH YOUR NAUSEA MEDICATION *UNUSUAL SHORTNESS OF BREATH *UNUSUAL BRUISING OR BLEEDING *URINARY PROBLEMS (pain or burning when urinating, or frequent urination) *BOWEL PROBLEMS (unusual diarrhea, constipation, pain near the anus) TENDERNESS IN MOUTH AND THROAT WITH OR WITHOUT PRESENCE OF ULCERS (sore throat, sores in mouth, or a toothache) UNUSUAL RASH, SWELLING OR PAIN  UNUSUAL VAGINAL DISCHARGE OR ITCHING   Items with * indicate a potential emergency and should be followed up as soon as possible or go to the Emergency Department if any problems should occur.  Please show the CHEMOTHERAPY ALERT  CARD or IMMUNOTHERAPY ALERT CARD at check-in to the Emergency Department and triage nurse.  Should you have questions after your visit or need to cancel or reschedule your appointment, please contact [facility]  Dept: [phone]  and follow the prompts.  Office hours are 8:00 a.m. to 4:30 p.m. Monday - Friday. Please note that voicemails left after 4:00 p.m. may not be returned until the following business day.  We are closed weekends and major holidays. You have access to a nurse at all times for urgent questions. Please call the main number to the clinic Dept: [phone] and follow the prompts.   For any non-urgent questions, you may also contact your provider using MyChart. We now offer e-Visits for anyone 68 and older to request care online for non-urgent symptoms. For details visit [email or web address].   Also download the MyChart app! Go to the app store, search "MyChart", open the app, select , and log in with your MyChart username and password.

## 2023-02-26 NOTE — Progress Notes (Signed)
[facility] OFFICE PROGRESS NOTE  Patient Care Team: Maria Brunette, MD as PCP - General (Internal Medicine) Maria Fusi Servando Snare, RN as Oncology Nurse Navigator (Oncology)  ASSESSMENT & PLAN:  Uterine cancer Maria Avila) The plan will be to continue adjuvant treatment for 3 cycles before repeating imaging study, due in December She has progressive mild pancytopenia and I recommend not to start radiation until she completes the chemotherapy next month Will proceed with treatment without Avila I will adjust the dose of her chemotherapy due to recent weight loss  Pancytopenia, acquired (HCC) Due to recent treatment She is not symptomatic We will proceed without Avila  History of stroke She has mild residual deficit with some balance issue and slight weakness on the left upper extremity but overall is very functional at home She will continue medical management and risk factor modification  No orders of the defined types were placed in this encounter.   All questions were answered. The patient knows to call the clinic with any problems, questions or concerns. The total time spent in the appointment was 30 minutes encounter with patients including review of chart and various tests results, discussions about plan of care and coordination of care plan   Maria Delay, MD 02/26/2023 12:09 PM  INTERVAL HISTORY: Please see below for problem oriented charting. she returns for treatment follow-up Since last time I saw her, she is doing fine She denies side effects of treatment such as nausea Denies constipation No peripheral neuropathy Denies recent bleeding We reviewed test results and discussed plan of care I noticed that she have lost some weight in comparison to her previous documentation  REVIEW OF SYSTEMS:   Constitutional: Denies fevers, chills or abnormal weight loss Eyes: Denies blurriness of vision Ears, nose, mouth, throat, and face: Denies mucositis or sore  throat Respiratory: Denies cough, dyspnea or wheezes Cardiovascular: Denies palpitation, chest discomfort or lower extremity swelling Gastrointestinal:  Denies nausea, heartburn or change in bowel habits Skin: Denies abnormal skin rashes Lymphatics: Denies new lymphadenopathy or easy bruising Neurological:Denies numbness, tingling or new weaknesses Behavioral/Psych: Mood is stable, no new changes  All other systems were reviewed with the patient and are negative.  I have reviewed the past medical history, past surgical history, social history and family history with the patient and they are unchanged from previous note.  ALLERGIES:  is allergic to crestor [rosuvastatin calcium] and zocor [simvastatin].  MEDICATIONS:  Current Outpatient Medications  Medication Sig Dispense Refill   acetaminophen (TYLENOL) 325 MG tablet Take 1-2 tablets (325-650 mg total) by mouth every 4 (four) hours as needed for mild pain.     apixaban (ELIQUIS) 2.5 MG TABS tablet Take 1 tablet (2.5 mg total) by mouth 2 (two) times daily. Plan to start 01/10/23 (Patient not taking: Reported on 01/30/2023) 24 tablet 0   benazepril-hydrochlorthiazide (LOTENSIN HCT) 20-25 MG tablet Take 1 tablet by mouth daily.     [employer] VITAMIN C 500 MG tablet TAKE 1 TABLET BY MOUTH TWICE A DAY 100 tablet 0   ezetimibe (ZETIA) 10 MG tablet Take 10 mg by mouth daily.     lidocaine-prilocaine (EMLA) cream Apply to affected area once (Patient taking differently: Apply 1 Application topically daily as needed (prior to port access).) 30 g 3   Multiple Vitamin (MULTIVITAMIN) capsule Take 1 capsule by mouth daily.     ondansetron (ZOFRAN) 8 MG tablet Take 1 tablet (8 mg total) by mouth every 8 (eight) hours as needed for nausea. 30 tablet 3  oxyCODONE (OXY IR/ROXICODONE) 5 MG immediate release tablet Take 1 tablet (5 mg total) by mouth every 4 (four) hours as needed for severe pain. 30 tablet 0   prochlorperazine (COMPAZINE) 10 MG tablet Take 1 tablet  (10 mg total) by mouth every 6 (six) hours as needed for nausea or vomiting. 30 tablet 1   senna-docusate (SENOKOT-S) 8.6-50 MG tablet Take 2 tablets by mouth at bedtime. Do not take if having diarrhea 60 tablet 0   thiamine (VITAMIN B-1) 100 MG tablet TAKE 1 TABLET BY MOUTH EVERY DAY 30 tablet 0   No current facility-administered medications for this visit.   Facility-Administered Medications Ordered in Other Visits  Medication Dose Route Frequency Provider Last Rate Last Admin   CARBOplatin (PARAPLATIN) 450 mg in sodium chloride 0.9 % 250 mL chemo infusion  450 mg Intravenous Once Maria [facility], Maria Wenker, MD       famotidine (PEPCID) IVPB 20 mg premix  20 mg Intravenous Once Maria [facility], Maria Mackie, MD 200 mL/hr at 02/26/23 1205 20 mg at 02/26/23 1205   fosaprepitant (EMEND) 150 mg in sodium chloride 0.9 % 145 mL IVPB  150 mg Intravenous Once Maria [facility], Maria Braaten, MD       heparin lock flush 100 unit/mL  500 Units Intracatheter Once PRN Maria [facility], Maria Dohrman, MD       PACLitaxel (TAXOL) 330 mg in sodium chloride 0.9 % 500 mL chemo infusion (> 80mg /m2)  175 mg/m2 (Treatment Plan Recorded) Intravenous Once Maria [facility], Maria Howley, MD       sodium chloride flush (NS) 0.9 % injection 10 mL  10 mL Intracatheter PRN Maria [facility], Maria Compere, MD       trastuzumab-anns (KANJINTI) 483 mg in sodium chloride 0.9 % 250 mL chemo infusion  6 mg/kg (Treatment Plan Recorded) Intravenous Once Maria Delay, MD        SUMMARY OF ONCOLOGIC HISTORY: Oncology History Overview Note  MMR normal, Her2 positive High grade serous   Uterine cancer (HCC)  08/12/2022 Initial Diagnosis   She presented with PMB   09/24/2022 Pathology Results   Diagnosis 1. Endometrium, curettage - HIGH GRADE SEROUS CARCINOMA (SEE NOTE) 2. Endometrium, resection - HIGH GRADE SEROUS CARCINOMA (SEE NOTE) Diagnosis Note 1. - immunohistochemical stain reveal tumor cells are positive for p16. P53 shows strong expression consistent with mutant type. This case was reviewed with Dr. Reynolds [facility] who agrees with the  diagnosis   10/14/2022 Imaging   1. Ill-defined fullness of the uterus and endometrial cavity without discretely visualized mass, in keeping with patient's known endometrial malignancy. 2. Enlarged right iliac and pelvic sidewall lymph nodes, as well as enlarged epicardial lymph nodes or soft tissue nodules. 3. Scattered peritoneal and omental soft tissue nodularity, particularly notable in the left upper quadrant. 4. Soft tissue matting about the distal sigmoid colon and rectum in the low pelvis. 5. Constellation of findings is consistent with nodal, peritoneal, and omental metastatic disease. 6. Small right pleural effusion, nonspecific although modestly suspicious for a malignant effusion, however without directly visualized pleural mass or nodularity. 7. Pancolonic diverticulosis, severe in the descending and sigmoid colon. 8. Cholelithiasis. 9. Coronary artery disease.   10/17/2022 Initial Diagnosis   Uterine cancer (HCC)   10/17/2022 Cancer Staging   Staging form: Corpus Uteri - Carcinoma and Carcinosarcoma, AJCC 8th Edition - Clinical stage from 10/17/2022: FIGO Stage IVB (cT1b, cN1, cM1) - Signed by Maria Delay, MD on 10/17/2022 Stage prefix: Initial diagnosis   10/23/2022 Echocardiogram   1. Left ventricular ejection fraction, by estimation, is 65 to 70%. Left  ventricular ejection fraction by 2D MOD biplane is 68.1 %. The left ventricle has normal function. The left ventricle has no regional wall motion abnormalities. There is mild left ventricular hypertrophy. Left ventricular diastolic parameters are consistent with Grade I diastolic dysfunction (impaired relaxation).  2. Right ventricular systolic function is normal. The right ventricular size is normal. There is normal pulmonary artery systolic pressure. The estimated right ventricular systolic pressure is 27.6 mmHg.  3. The mitral valve is abnormal. Trivial mitral valve regurgitation.  4. The aortic valve is tricuspid. Aortic valve  regurgitation is not visualized. Aortic valve sclerosis is present, with no evidence of aortic valve stenosis.  5. The inferior vena cava is normal in size with greater than 50% respiratory variability, suggesting right atrial pressure of 3 mmHg.   10/28/2022 -  Chemotherapy   Patient is on Treatment Plan : UTERINE SEROUS CARCINOMA Carboplatin + Paclitaxel + Trastuzumab q21d x 6 Cycles / Trastuzumab q21d     12/23/2022 Imaging   CT ABDOMEN PELVIS W CONTRAST  Result Date: 12/23/2022 CLINICAL DATA:  Follow-up endometrial cancer, status post chemotherapy EXAM: CT ABDOMEN AND PELVIS WITH CONTRAST TECHNIQUE: Multidetector CT imaging of the abdomen and pelvis was performed using the standard protocol following bolus administration of intravenous contrast. RADIATION DOSE REDUCTION: This exam was performed according to the departmental dose-optimization program which includes automated exposure control, adjustment of the mA and/or kV according to patient size and/or use of iterative reconstruction technique. CONTRAST:  75mL OMNIPAQUE IOHEXOL 350 MG/ML SOLN COMPARISON:  10/14/2022 FINDINGS: Lower chest: Small right pleural effusion. Associated bilateral lower lobe atelectasis. Hepatobiliary: Liver is within normal limits. Layering small gallstones (series 3/image 27), without associated inflammatory changes. No intrahepatic or extrahepatic duct dilatation. Pancreas: Within normal limits. Spleen: Within normal limits. Adrenals/Urinary Tract: Adrenal glands are within normal limits. Kidneys are within normal limits.  No hydronephrosis. Bladder is within normal limits. Stomach/Bowel: Stomach is within normal limits. No evidence of bowel obstruction. Normal appendix (series 3/image 46). Left colonic diverticulosis, without evidence of diverticulitis. Vascular/Lymphatic: No evidence of abdominal aortic aneurysm. Atherosclerotic calcifications of the abdominal aorta and branch vessels, although vessels remain patent. Small  right iliac nodes, including a dominant 10 mm short axis right obturator node (series 3/image 56), previously 12 mm. Reproductive: Mildly heterogeneous uterus with endometrial thickening measuring up to 16 mm (series 7/image 33), corresponding to the patient's known endometrial cancer. No adnexal masses. Other: No abdominopelvic ascites. Mild peritoneal disease/omental caking beneath the left mid abdominal wall (series 3/image 45), mildly improved. Minimal perirectal soft tissue in the left lower pelvis (series 3/image 58), improved. Musculoskeletal: Degenerative changes of the visualized thoracolumbar spine. IMPRESSION: Mildly heterogeneous uterus with endometrial thickening, corresponding to the patient's known endometrial cancer. Small right pelvic nodal metastases, improved. Mild peritoneal disease/omental caking, improved Small right pleural effusion. Electronically Signed   By: Charline Bills M.D.   On: 12/23/2022 02:18      01/07/2023 Surgery   Robotic-assisted laparoscopic total hysterectomy with bilateral salpingo-oophorectomy, lysis of adhesions, tumor debulking including peritoneal stripping, omentectomy, mini laparotomy    01/07/2023 Pathology Results   CASE: [id number] PATIENT: Maria [facility] Surgical Pathology Report  Reason for Addendum #1:  DNA Mismatch Repair IHC Results  Clinical History: advanced uterine cancer     FINAL MICROSCOPIC DIAGNOSIS:  A. UTERUS, CERVIX, BILATERAL FALLOPIAN TUBES AND OVARIES, RESECTION: - High-grade serous carcinoma, involving the entire endometrial surface, see comment - Carcinoma invades for a depth of 1.6 cm myometrial thickness is 1.7 cm (>  90%) - Cervical stroma is involved by carcinoma - No evidence of lymphovascular invasion - Carcinoma involves surface of bilateral ovaries and left fallopian tube - Benign leiomyomata - See oncology table  B. POSTERIOR CUL DE SAC, EXCISION: - High-grade serous carcinoma with psammomatous  calcifications  C. LEFT PELVIC SIDEWALL, EXCISION: - High-grade serous carcinoma with psammomatous calcifications  D. OMENTUM, EXCISION: - High-grade serous carcinoma with psammomatous calcifications  COMMENT:  Immunohistochemical stains for p53 and p16 are diffusely positive in the tumor cells (clonal overexpression pattern), consistent with above diagnosis.  Dr. Kenyon Ana reviewed the case and concurs with the above diagnosis.  ONCOLOGY TABLE:  UTERUS, CARCINOMA OR CARCINOSARCOMA: Resection  Procedure: Total hysterectomy and bilateral salpingo-oophorectomy Histologic Type: Serous carcinoma Histologic Grade: High-grade Myometrial Invasion:      Depth of Myometrial Invasion (mm): 16 mm      Myometrial Thickness (mm): 17 mm      Percentage of Myometrial Invasion: >90% Uterine Serosa Involvement: Not identified Cervical stromal Involvement: Present Extent of involvement of other tissue/organs: Bilateral ovaries, left fallopian tube and pelvic sidewall Peritoneal/Ascitic Fluid: Not applicable Lymphovascular Invasion: Not identified Regional Lymph Nodes: Not applicable (no lymph nodes submitted or found)  Distant Metastasis:      Distant Site(s) Involved: Omentum Pathologic Stage Classification (pTNM, AJCC 8th Edition): pT3a, pN not assigned Ancillary Studies: MMR / MSI testing will be ordered    01/07/2023 Surgery   OPERATIVE NOTE  Pre-operative Diagnosis: 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  On intra-abdominal entry, normal upper abdominal survey.  Omentum adherent to the sigmoid epiploica and  the descending colon.  5 mm tumor implant along the left pelvic brim.  Normal-appearing bilateral tubes and ovaries although the sigmoid colon and epiploica adherent to the medial aspect of the left adnexa and posterior uterus.  Miliary disease noted over the anterior aspect of the sigmoid colon, rectum, and posterior cul-de-sac peritoneum.  Some obliteration of the deep posterior cul-de-sac.  On rectal exam, approximately 7-8 cm above the anal verge, just superior to the cervicovaginal junction, there is some tethering of the rectum.  Some adhesions between the bladder and lower uterine segment.  Minimal pelvic ascites.  Medical comorbidities as well as what would have required a very low rectal resection and anastomosis to achieve complete cytoreduction with the need for a diverting ileostomy, decision made to remove the bulk of disease without bowel surgery.  Much of the pelvic peritoneum with miliary disease stripped within the cul-de-sac.   02/23/2023 Echocardiogram     1. Left ventricular ejection fraction, by estimation, is 60 to 65%. Left ventricular ejection fraction by 3D volume is 61 %. The left ventricle has normal function. The left ventricle has no regional wall motion abnormalities. Left ventricular diastolic  parameters were normal. The average left ventricular global longitudinal strain is -20.9 %. The global longitudinal strain is normal.  2. Right ventricular systolic function is normal. The right ventricular size is normal. There is normal pulmonary artery systolic pressure. The estimated right ventricular systolic pressure is 25.7 mmHg.  3. The mitral valve is grossly normal. Trivial mitral valve regurgitation. No evidence of mitral stenosis.  4. The aortic valve is  tricuspid. Aortic valve regurgitation is not visualized. No aortic stenosis is present.  5. The inferior vena cava is normal in size with greater than 50% respiratory variability, suggesting right atrial pressure of 3  mmHg.       PHYSICAL EXAMINATION: ECOG PERFORMANCE STATUS: 1 - Symptomatic but completely ambulatory  Vitals:   02/26/23 1057  BP: (!) 149/87  Pulse: 61  Resp: 18  Temp: 98.6 F (37 C)  SpO2: 99%   Filed Weights   02/26/23 1057  Weight: 180 lb 6.4 oz (81.8 kg)    GENERAL:alert, no distress and comfortable LABORATORY DATA:  I have reviewed the data as listed    Component Value Date/Time   NA 138 02/26/2023 1029   NA 144 05/13/2018 1500   K 3.5 02/26/2023 1029   CL 104 02/26/2023 1029   CO2 25 02/26/2023 1029   GLUCOSE 93 02/26/2023 1029   BUN 15 02/26/2023 1029   BUN 13 05/13/2018 1500   CREATININE 0.68 02/26/2023 1029   CALCIUM 9.6 02/26/2023 1029   PROT 7.5 02/26/2023 1029   ALBUMIN 4.2 02/26/2023 1029   AST 16 02/26/2023 1029   ALT 14 02/26/2023 1029   ALKPHOS 56 02/26/2023 1029   BILITOT 0.5 02/26/2023 1029   GFRNONAA >60 02/26/2023 1029   GFRAA 110 05/13/2018 1500   GFRAA >60 07/21/2017 1131    No results found for: "SPEP", "UPEP"  Lab Results  Component Value Date   WBC 4.0 02/26/2023   NEUTROABS 1.2 (L) 02/26/2023   HGB 9.8 (L) 02/26/2023   HCT 28.5 (L) 02/26/2023   MCV 99.7 02/26/2023   PLT 129 (L) 02/26/2023      Chemistry      Component Value Date/Time   NA 138 02/26/2023 1029   NA 144 05/13/2018 1500   K 3.5 02/26/2023 1029   CL 104 02/26/2023 1029   CO2 25 02/26/2023 1029   BUN 15 02/26/2023 1029   BUN 13 05/13/2018 1500   CREATININE 0.68 02/26/2023 1029      Component Value Date/Time   CALCIUM 9.6 02/26/2023 1029   ALKPHOS 56 02/26/2023 1029   AST 16 02/26/2023 1029   ALT 14 02/26/2023 1029   BILITOT 0.5 02/26/2023 1029       RADIOGRAPHIC STUDIES: I have personally reviewed the radiological images as listed and agreed with the findings in the report. ECHOCARDIOGRAM COMPLETE  Result Date: 02/20/2023    ECHOCARDIOGRAM REPORT   Patient Name:   Maria [facility]ila Date of Exam: 02/20/2023 Medical Rec #:  [id number]     Height:        62.0 in Accession #:    [id number]    Weight:       180.1 lb Date of Birth:  [birth year]     BSA:          1.829 m Patient Age:    72 years      BP:           142/77 mmHg Patient Gender: F             HR:           77 bpm. Exam Location:  Outpatient Procedure: 2D Echo, 3D Echo, Cardiac Doppler, Color Doppler and Strain Analysis Indications:    Z51.11 Encounter for antineoplastic chemotheraphy  History:        Patient has prior history of Echocardiogram examinations, most  recent 10/23/2022. Stroke, Signs/Symptoms:Dyspnea; Risk                 Factors:Hypertension. ETOH.  Sonographer:    Sheralyn Boatman RDCS Referring Phys: [id number] Lonnie Rosado [facility]  Sonographer Comments: Patient is obese. Image acquisition challenging due to patient body habitus. IMPRESSIONS  1. Left ventricular ejection fraction, by estimation, is 60 to 65%. Left ventricular ejection fraction by 3D volume is 61 %. The left ventricle has normal function. The left ventricle has no regional wall motion abnormalities. Left ventricular diastolic  parameters were normal. The average left ventricular global longitudinal strain is -20.9 %. The global longitudinal strain is normal.  2. Right ventricular systolic function is normal. The right ventricular size is normal. There is normal pulmonary artery systolic pressure. The estimated right ventricular systolic pressure is 25.7 mmHg.  3. The mitral valve is grossly normal. Trivial mitral valve regurgitation. No evidence of mitral stenosis.  4. The aortic valve is tricuspid. Aortic valve regurgitation is not visualized. No aortic stenosis is present.  5. The inferior vena cava is normal in size with greater than 50% respiratory variability, suggesting right atrial pressure of 3 mmHg. Comparison(s): No significant change from prior study. FINDINGS  Left Ventricle: Left ventricular ejection fraction, by estimation, is 60 to 65%. Left ventricular ejection fraction by 3D volume is 61 %. The left ventricle has  normal function. The left ventricle has no regional wall motion abnormalities. The average left ventricular global longitudinal strain is -20.9 %. The global longitudinal strain is normal. The left ventricular internal cavity size was normal in size. There is no left ventricular hypertrophy. Left ventricular diastolic parameters were normal. Right Ventricle: The right ventricular size is normal. No increase in right ventricular wall thickness. Right ventricular systolic function is normal. There is normal pulmonary artery systolic pressure. The tricuspid regurgitant velocity is 2.38 m/s, and  with an assumed right atrial pressure of 3 mmHg, the estimated right ventricular systolic pressure is 25.7 mmHg. Left Atrium: Left atrial size was normal in size. Right Atrium: Right atrial size was normal in size. Prominent Chiari network. Pericardium: There is no evidence of pericardial effusion. Mitral Valve: The mitral valve is grossly normal. Trivial mitral valve regurgitation. No evidence of mitral valve stenosis. Tricuspid Valve: The tricuspid valve is grossly normal. Tricuspid valve regurgitation is trivial. No evidence of tricuspid stenosis. Aortic Valve: The aortic valve is tricuspid. Aortic valve regurgitation is not visualized. No aortic stenosis is present. Pulmonic Valve: The pulmonic valve was grossly normal. Pulmonic valve regurgitation is trivial. No evidence of pulmonic stenosis. Aorta: The aortic root and ascending aorta are structurally normal, with no evidence of dilitation. Venous: The inferior vena cava is normal in size with greater than 50% respiratory variability, suggesting right atrial pressure of 3 mmHg. IAS/Shunts: The atrial septum is grossly normal.  LEFT VENTRICLE PLAX 2D LVIDd:         3.90 cm         Diastology LVIDs:         2.50 cm         LV e' medial:    7.18 cm/s LV PW:         1.10 cm         LV E/e' medial:  10.2 LV IVS:        0.70 cm         LV e' lateral:   9.79 cm/s LVOT diam:      2.10 cm  LV E/e' lateral: 7.5 LV SV:         78 LV SV Index:   43              2D LVOT Area:     3.46 cm        Longitudinal                                Strain                                2D Strain GLS  -20.9 % LV Volumes (MOD)               Avg: LV vol d, MOD    72.5 ml A2C:                           3D Volume EF LV vol d, MOD    70.4 ml       LV 3D EF:    Left A4C:                                        ventricul LV vol s, MOD    27.2 ml                    ar A2C:                                        ejection LV vol s, MOD    24.1 ml                    fraction A4C:                                        by 3D LV SV MOD A2C:   45.3 ml                    volume is LV SV MOD A4C:   70.4 ml                    61 %. LV SV MOD BP:    48.3 ml                                 3D Volume EF:                                3D EF:        61 %                                LV EDV:       99 ml                                LV ESV:  39 ml                                LV SV:        60 ml RIGHT VENTRICLE            IVC RV S prime:     8.27 cm/s  IVC diam: 2.40 cm TAPSE (M-mode): 2.2 cm LEFT ATRIUM             Index        RIGHT ATRIUM          Index LA diam:        2.70 cm 1.48 cm/m   RA Area:     9.35 cm LA Vol (A2C):   43.3 ml 23.68 ml/m  RA Volume:   17.70 ml 9.68 ml/m LA Vol (A4C):   31.1 ml 17.01 ml/m LA Biplane Vol: 38.2 ml 20.89 ml/m  AORTIC VALVE             PULMONIC VALVE LVOT Vmax:   117.00 cm/s PR End Diast Vel: 1.73 msec LVOT Vmean:  71.100 cm/s LVOT VTI:    0.225 m  AORTA Ao Root diam: 3.10 cm Ao Asc diam:  3.50 cm MITRAL VALVE               TRICUSPID VALVE MV Area (PHT): 3.99 cm    TR Peak grad:   22.7 mmHg MV Decel Time: 190 msec    TR Vmax:        238.00 cm/s MV E velocity: 73.30 cm/s MV A velocity: 64.00 cm/s  SHUNTS MV E/A ratio:  1.15        Systemic VTI:  0.22 m                            Systemic Diam: 2.10 cm Lennie Odor MD Electronically signed by Lennie Odor MD Signature  Date/Time: 02/20/2023/1:30:24 PM    Final

## 2023-02-26 NOTE — Assessment & Plan Note (Signed)
Due to recent treatment She is not symptomatic We will proceed without delay

## 2023-02-26 NOTE — Assessment & Plan Note (Signed)
She has mild residual deficit with some balance issue and slight weakness on the left upper extremity but overall is very functional at home She will continue medical management and risk factor modification

## 2023-03-04 ENCOUNTER — Encounter: Payer: Self-pay | Admitting: Gynecologic Oncology

## 2023-03-18 MED FILL — Fosaprepitant Dimeglumine For IV Infusion 150 MG (Base Eq): INTRAVENOUS | Qty: 5 | Status: AC

## 2023-03-19 ENCOUNTER — Inpatient Hospital Stay: Payer: Medicare Other

## 2023-03-19 ENCOUNTER — Inpatient Hospital Stay: Payer: Medicare Other | Admitting: Hematology and Oncology

## 2023-03-19 ENCOUNTER — Encounter: Payer: Self-pay | Admitting: Hematology and Oncology

## 2023-03-19 ENCOUNTER — Inpatient Hospital Stay: Payer: Medicare Other | Attending: Gynecologic Oncology

## 2023-03-19 VITALS — BP 118/53 | HR 52 | Temp 97.4°F | Resp 18 | Ht 62.0 in | Wt 179.8 lb

## 2023-03-19 DIAGNOSIS — D61818 Other pancytopenia: Secondary | ICD-10-CM

## 2023-03-19 DIAGNOSIS — C55 Malignant neoplasm of uterus, part unspecified: Secondary | ICD-10-CM | POA: Diagnosis present

## 2023-03-19 DIAGNOSIS — Z5112 Encounter for antineoplastic immunotherapy: Secondary | ICD-10-CM | POA: Diagnosis not present

## 2023-03-19 DIAGNOSIS — T451X5A Adverse effect of antineoplastic and immunosuppressive drugs, initial encounter: Secondary | ICD-10-CM

## 2023-03-19 DIAGNOSIS — Z79620 Long term (current) use of immunosuppressive biologic: Secondary | ICD-10-CM | POA: Insufficient documentation

## 2023-03-19 DIAGNOSIS — C775 Secondary and unspecified malignant neoplasm of intrapelvic lymph nodes: Secondary | ICD-10-CM | POA: Diagnosis not present

## 2023-03-19 DIAGNOSIS — Z5111 Encounter for antineoplastic chemotherapy: Secondary | ICD-10-CM | POA: Diagnosis not present

## 2023-03-19 DIAGNOSIS — G620 Drug-induced polyneuropathy: Secondary | ICD-10-CM | POA: Diagnosis not present

## 2023-03-19 LAB — CMP (CANCER CENTER ONLY)
ALT: 11 U/L (ref 0–44)
AST: 14 U/L — ABNORMAL LOW (ref 15–41)
Albumin: 4.3 g/dL (ref 3.5–5.0)
Alkaline Phosphatase: 61 U/L (ref 38–126)
Anion gap: 7 (ref 5–15)
BUN: 16 mg/dL (ref 8–23)
CO2: 28 mmol/L (ref 22–32)
Calcium: 9.7 mg/dL (ref 8.9–10.3)
Chloride: 106 mmol/L (ref 98–111)
Creatinine: 0.69 mg/dL (ref 0.44–1.00)
GFR, Estimated: >60 mL/min (ref >60)
Glucose, Bld: 91 mg/dL (ref 70–99)
Potassium: 3.4 mmol/L — ABNORMAL LOW (ref 3.5–5.1)
Sodium: 141 mmol/L (ref 135–145)
Total Bilirubin: 0.4 mg/dL (ref <1.2)
Total Protein: 7.4 g/dL (ref 6.5–8.1)

## 2023-03-19 LAB — VITAMIN B12: Vitamin B-12: 405 pg/mL (ref 180–914)

## 2023-03-19 LAB — CBC WITH DIFFERENTIAL (CANCER CENTER ONLY)
Abs Immature Granulocytes: 0 10*3/uL (ref 0.00–0.07)
Basophils Absolute: 0 10*3/uL (ref 0.0–0.1)
Basophils Relative: 0 %
Eosinophils Absolute: 0 10*3/uL (ref 0.0–0.5)
Eosinophils Relative: 1 %
HCT: 27.7 % — ABNORMAL LOW (ref 36.0–46.0)
Hemoglobin: 9.5 g/dL — ABNORMAL LOW (ref 12.0–15.0)
Immature Granulocytes: 0 %
Lymphocytes Relative: 70 %
Lymphs Abs: 2.2 10*3/uL (ref 0.7–4.0)
MCH: 34.2 pg — ABNORMAL HIGH (ref 26.0–34.0)
MCHC: 34.3 g/dL (ref 30.0–36.0)
MCV: 99.6 fL (ref 80.0–100.0)
Monocytes Absolute: 0.3 10*3/uL (ref 0.1–1.0)
Monocytes Relative: 8 %
Neutro Abs: 0.7 10*3/uL — ABNORMAL LOW (ref 1.7–7.7)
Neutrophils Relative %: 21 %
Platelet Count: 117 10*3/uL — ABNORMAL LOW (ref 150–400)
RBC: 2.78 MIL/uL — ABNORMAL LOW (ref 3.87–5.11)
RDW: 13.6 % (ref 11.5–15.5)
WBC Count: 3.1 10*3/uL — ABNORMAL LOW (ref 4.0–10.5)
nRBC: 0 % (ref 0.0–0.2)

## 2023-03-19 MED ORDER — TRASTUZUMAB-ANNS CHEMO 150 MG IV SOLR
6.0000 mg/kg | Freq: Once | INTRAVENOUS | Status: AC
  Administered 2023-03-19: 483 mg via INTRAVENOUS
  Filled 2023-03-19: qty 23

## 2023-03-19 MED ORDER — PALONOSETRON HCL INJECTION 0.25 MG/5ML
0.2500 mg | Freq: Once | INTRAVENOUS | Status: AC
  Administered 2023-03-19: 0.25 mg via INTRAVENOUS
  Filled 2023-03-19: qty 5

## 2023-03-19 MED ORDER — SODIUM CHLORIDE 0.9 % IV SOLN
448.0000 mg | Freq: Once | INTRAVENOUS | Status: AC
  Administered 2023-03-19: 450 mg via INTRAVENOUS
  Filled 2023-03-19: qty 45

## 2023-03-19 MED ORDER — SODIUM CHLORIDE 0.9 % IV SOLN
140.0000 mg/m2 | Freq: Once | INTRAVENOUS | Status: AC
  Administered 2023-03-19: 264 mg via INTRAVENOUS
  Filled 2023-03-19: qty 44

## 2023-03-19 MED ORDER — CETIRIZINE HCL 10 MG/ML IV SOLN
10.0000 mg | Freq: Once | INTRAVENOUS | Status: AC
  Administered 2023-03-19: 10 mg via INTRAVENOUS
  Filled 2023-03-19: qty 1

## 2023-03-19 MED ORDER — OXYCODONE HCL 5 MG PO TABS: 5.0000 mg | ORAL_TABLET | ORAL | 0 refills | Status: DC | PRN

## 2023-03-19 MED ORDER — SODIUM CHLORIDE 0.9% FLUSH
10.0000 mL | INTRAVENOUS | Status: DC | PRN
Start: 2023-03-19 — End: 2023-03-19
  Administered 2023-03-19: 10 mL

## 2023-03-19 MED ORDER — DEXAMETHASONE SODIUM PHOSPHATE 10 MG/ML IJ SOLN
10.0000 mg | Freq: Once | INTRAMUSCULAR | Status: AC
Start: 2023-03-19 — End: 2023-03-19
  Administered 2023-03-19: 10 mg via INTRAVENOUS
  Filled 2023-03-19: qty 1

## 2023-03-19 MED ORDER — SODIUM CHLORIDE 0.9% FLUSH
10.0000 mL | Freq: Once | INTRAVENOUS | Status: AC
  Administered 2023-03-19: 10 mL

## 2023-03-19 MED ORDER — FAMOTIDINE IN NACL 20-0.9 MG/50ML-% IV SOLN
20.0000 mg | Freq: Once | INTRAVENOUS | Status: AC
  Administered 2023-03-19: 20 mg via INTRAVENOUS
  Filled 2023-03-19: qty 50

## 2023-03-19 MED ORDER — ACETAMINOPHEN 325 MG PO TABS
650.0000 mg | ORAL_TABLET | Freq: Once | ORAL | Status: AC
  Administered 2023-03-19: 650 mg via ORAL
  Filled 2023-03-19: qty 2

## 2023-03-19 MED ORDER — SODIUM CHLORIDE 0.9 % IV SOLN: Freq: Once | INTRAVENOUS | Status: AC

## 2023-03-19 MED ORDER — SODIUM CHLORIDE 0.9 % IV SOLN
150.0000 mg | Freq: Once | INTRAVENOUS | Status: AC
  Administered 2023-03-19: 150 mg via INTRAVENOUS
  Filled 2023-03-19: qty 150

## 2023-03-19 MED ORDER — HEPARIN SOD (PORK) LOCK FLUSH 100 UNIT/ML IV SOLN
500.0000 [IU] | Freq: Once | INTRAVENOUS | Status: AC | PRN
  Administered 2023-03-19: 500 [IU]

## 2023-03-19 NOTE — Progress Notes (Signed)
[facility] OFFICE PROGRESS NOTE  Patient Care Team: Merri Brunette, MD as PCP - General (Internal Medicine) Paulina Fusi Servando Snare, RN as Oncology Nurse Navigator (Oncology)  ASSESSMENT & PLAN:  Uterine cancer [facility]) She has developed worsening pancytopenia and slight neuropathy We discussed risk and benefits of continuing treatment without delay with minor dose adjustment and she agreed to proceed I will reduce the dose of paclitaxel and we will proceed without delay with additional neutropenic precaution She is made aware that she will be switched over to maintenance trastuzumab only in the future I plan to repeat imaging study at the end of the month for new staging  Pancytopenia, acquired (HCC) Due to recent treatment She is not symptomatic We will proceed without delay, with mild dose reduction of paclitaxel I will order vitamin B12 level to rule out B12 deficiency as a contributing factor to pancytopenia We discussed neutropenic precaution  Peripheral neuropathy due to chemotherapy [facility]) she has mild peripheral neuropathy, likely related to side effects of treatment. I plan to reduce the dose of treatment as outlined above.   Orders Placed This Encounter  Procedures   CT CHEST ABDOMEN PELVIS W CONTRAST    Standing Status:   Future    Standing Expiration Date:   03/18/2024    Order Specific Question:   If indicated for the ordered procedure, I authorize the administration of contrast media per Radiology protocol    Answer:   Yes    Order Specific Question:   Does the patient have a contrast media/X-ray dye allergy?    Answer:   No    Order Specific Question:   Preferred imaging location?    Answer:   [facility]    Order Specific Question:   If indicated for the ordered procedure, I authorize the administration of oral contrast media per Radiology protocol    Answer:   Yes   Vitamin B12    Standing Status:   Future    Number of Occurrences:   1    Standing  Expiration Date:   03/18/2024   Comprehensive metabolic panel    Standing Status:   Standing    Number of Occurrences:   33    Standing Expiration Date:   03/18/2024   CBC with Differential/Platelet    Standing Status:   Standing    Number of Occurrences:   22    Standing Expiration Date:   03/18/2024    All questions were answered. The patient knows to call the clinic with any problems, questions or concerns. The total time spent in the appointment was 40 minutes encounter with patients including review of chart and various tests results, discussions about plan of care and coordination of care plan   Artis Delay, MD 03/19/2023 10:22 AM  INTERVAL HISTORY: Please see below for problem oriented charting. she returns for cycle 6 of treatment She has a very mild peripheral neuropathy in the feet Denies nausea or changes in bowel habits No recent infection, fever or chills We discussed test results and plan for future follow-up  REVIEW OF SYSTEMS:   Constitutional: Denies fevers, chills or abnormal weight loss Eyes: Denies blurriness of vision Ears, nose, mouth, throat, and face: Denies mucositis or sore throat Respiratory: Denies cough, dyspnea or wheezes Cardiovascular: Denies palpitation, chest discomfort or lower extremity swelling Gastrointestinal:  Denies nausea, heartburn or change in bowel habits Skin: Denies abnormal skin rashes Lymphatics: Denies new lymphadenopathy or easy bruising Behavioral/Psych: Mood is stable, no  new changes  All other systems were reviewed with the patient and are negative.  I have reviewed the past medical history, past surgical history, social history and family history with the patient and they are unchanged from previous note.  ALLERGIES:  is allergic to crestor [rosuvastatin calcium] and zocor [simvastatin].  MEDICATIONS:  Current Outpatient Medications  Medication Sig Dispense Refill   acetaminophen (TYLENOL) 325 MG tablet Take 1-2 tablets  (325-650 mg total) by mouth every 4 (four) hours as needed for mild pain.     benazepril-hydrochlorthiazide (LOTENSIN HCT) 20-25 MG tablet Take 1 tablet by mouth daily.     [employer] VITAMIN C 500 MG tablet TAKE 1 TABLET BY MOUTH TWICE A DAY 100 tablet 0   ezetimibe (ZETIA) 10 MG tablet Take 10 mg by mouth daily.     lidocaine-prilocaine (EMLA) cream Apply to affected area once (Patient taking differently: Apply 1 Application topically daily as needed (prior to port access).) 30 g 3   Multiple Vitamin (MULTIVITAMIN) capsule Take 1 capsule by mouth daily.     ondansetron (ZOFRAN) 8 MG tablet Take 1 tablet (8 mg total) by mouth every 8 (eight) hours as needed for nausea. 30 tablet 3   oxyCODONE (OXY IR/ROXICODONE) 5 MG immediate release tablet Take 1 tablet (5 mg total) by mouth every 4 (four) hours as needed for severe pain (pain score 7-10). 30 tablet 0   prochlorperazine (COMPAZINE) 10 MG tablet Take 1 tablet (10 mg total) by mouth every 6 (six) hours as needed for nausea or vomiting. 30 tablet 1   thiamine (VITAMIN B-1) 100 MG tablet TAKE 1 TABLET BY MOUTH EVERY DAY 30 tablet 0   No current facility-administered medications for this visit.   Facility-Administered Medications Ordered in Other Visits  Medication Dose Route Frequency Provider Last Rate Last Admin   CARBOplatin (PARAPLATIN) 450 mg in sodium chloride 0.9 % 250 mL chemo infusion  450 mg Intravenous Once Bertis Ruddy, Jurgen Groeneveld, MD       famotidine (PEPCID) IVPB 20 mg premix  20 mg Intravenous Once Bertis Ruddy, Makaylin Carlo, MD       fosaprepitant (EMEND) 150 mg in sodium chloride 0.9 % 145 mL IVPB  150 mg Intravenous Once Bertis Ruddy, Soundra Lampley, MD       heparin lock flush 100 unit/mL  500 Units Intracatheter Once PRN Bertis Ruddy, Lisseth Brazeau, MD       PACLitaxel (TAXOL) 264 mg in sodium chloride 0.9 % 250 mL chemo infusion (> 80mg /m2)  140 mg/m2 (Treatment Plan Recorded) Intravenous Once Bertis Ruddy, Harshini Trent, MD       sodium chloride flush (NS) 0.9 % injection 10 mL  10 mL Intracatheter PRN  Bertis Ruddy, Camaron Cammack, MD       trastuzumab-anns (KANJINTI) 483 mg in sodium chloride 0.9 % 250 mL chemo infusion  6 mg/kg (Treatment Plan Recorded) Intravenous Once Artis Delay, MD        SUMMARY OF ONCOLOGIC HISTORY: Oncology History Overview Note  MMR normal, Her2 positive, FISh 3+, MSI stable High grade serous PIK3CA pathogenic variant, p53 mutated   Uterine cancer (HCC)  08/12/2022 Initial Diagnosis   She presented with PMB   09/24/2022 Pathology Results   Diagnosis 1. Endometrium, curettage - HIGH GRADE SEROUS CARCINOMA (SEE NOTE) 2. Endometrium, resection - HIGH GRADE SEROUS CARCINOMA (SEE NOTE) Diagnosis Note 1. - immunohistochemical stain reveal tumor cells are positive for p16. P53 shows strong expression consistent with mutant type. This case was reviewed with Dr. Reynolds Bowl who agrees with the diagnosis   10/14/2022 Imaging  1. Ill-defined fullness of the uterus and endometrial cavity without discretely visualized mass, in keeping with patient's known endometrial malignancy. 2. Enlarged right iliac and pelvic sidewall lymph nodes, as well as enlarged epicardial lymph nodes or soft tissue nodules. 3. Scattered peritoneal and omental soft tissue nodularity, particularly notable in the left upper quadrant. 4. Soft tissue matting about the distal sigmoid colon and rectum in the low pelvis. 5. Constellation of findings is consistent with nodal, peritoneal, and omental metastatic disease. 6. Small right pleural effusion, nonspecific although modestly suspicious for a malignant effusion, however without directly visualized pleural mass or nodularity. 7. Pancolonic diverticulosis, severe in the descending and sigmoid colon. 8. Cholelithiasis. 9. Coronary artery disease.   10/17/2022 Initial Diagnosis   Uterine cancer (HCC)   10/17/2022 Cancer Staging   Staging form: Corpus Uteri - Carcinoma and Carcinosarcoma, AJCC 8th Edition - Clinical stage from 10/17/2022: FIGO Stage IVB (cT1b, cN1, cM1) -  Signed by Artis Delay, MD on 10/17/2022 Stage prefix: Initial diagnosis   10/23/2022 Echocardiogram   1. Left ventricular ejection fraction, by estimation, is 65 to 70%. Left ventricular ejection fraction by 2D MOD biplane is 68.1 %. The left ventricle has normal function. The left ventricle has no regional wall motion abnormalities. There is mild left ventricular hypertrophy. Left ventricular diastolic parameters are consistent with Grade I diastolic dysfunction (impaired relaxation).  2. Right ventricular systolic function is normal. The right ventricular size is normal. There is normal pulmonary artery systolic pressure. The estimated right ventricular systolic pressure is 27.6 mmHg.  3. The mitral valve is abnormal. Trivial mitral valve regurgitation.  4. The aortic valve is tricuspid. Aortic valve regurgitation is not visualized. Aortic valve sclerosis is present, with no evidence of aortic valve stenosis.  5. The inferior vena cava is normal in size with greater than 50% respiratory variability, suggesting right atrial pressure of 3 mmHg.   10/28/2022 -  Chemotherapy   Patient is on Treatment Plan : UTERINE SEROUS CARCINOMA Carboplatin + Paclitaxel + Trastuzumab q21d x 6 Cycles / Trastuzumab q21d     12/23/2022 Imaging   CT ABDOMEN PELVIS W CONTRAST  Result Date: 12/23/2022 CLINICAL DATA:  Follow-up endometrial cancer, status post chemotherapy EXAM: CT ABDOMEN AND PELVIS WITH CONTRAST TECHNIQUE: Multidetector CT imaging of the abdomen and pelvis was performed using the standard protocol following bolus administration of intravenous contrast. RADIATION DOSE REDUCTION: This exam was performed according to the departmental dose-optimization program which includes automated exposure control, adjustment of the mA and/or kV according to patient size and/or use of iterative reconstruction technique. CONTRAST:  75mL OMNIPAQUE IOHEXOL 350 MG/ML SOLN COMPARISON:  10/14/2022 FINDINGS: Lower chest: Small right  pleural effusion. Associated bilateral lower lobe atelectasis. Hepatobiliary: Liver is within normal limits. Layering small gallstones (series 3/image 27), without associated inflammatory changes. No intrahepatic or extrahepatic duct dilatation. Pancreas: Within normal limits. Spleen: Within normal limits. Adrenals/Urinary Tract: Adrenal glands are within normal limits. Kidneys are within normal limits.  No hydronephrosis. Bladder is within normal limits. Stomach/Bowel: Stomach is within normal limits. No evidence of bowel obstruction. Normal appendix (series 3/image 46). Left colonic diverticulosis, without evidence of diverticulitis. Vascular/Lymphatic: No evidence of abdominal aortic aneurysm. Atherosclerotic calcifications of the abdominal aorta and branch vessels, although vessels remain patent. Small right iliac nodes, including a dominant 10 mm short axis right obturator node (series 3/image 56), previously 12 mm. Reproductive: Mildly heterogeneous uterus with endometrial thickening measuring up to 16 mm (series 7/image 33), corresponding to the patient's known endometrial cancer.  No adnexal masses. Other: No abdominopelvic ascites. Mild peritoneal disease/omental caking beneath the left mid abdominal wall (series 3/image 45), mildly improved. Minimal perirectal soft tissue in the left lower pelvis (series 3/image 58), improved. Musculoskeletal: Degenerative changes of the visualized thoracolumbar spine. IMPRESSION: Mildly heterogeneous uterus with endometrial thickening, corresponding to the patient's known endometrial cancer. Small right pelvic nodal metastases, improved. Mild peritoneal disease/omental caking, improved Small right pleural effusion. Electronically Signed   By: Charline Bills M.D.   On: 12/23/2022 02:18      01/07/2023 Surgery   Robotic-assisted laparoscopic total hysterectomy with bilateral salpingo-oophorectomy, lysis of adhesions, tumor debulking including peritoneal stripping,  omentectomy, mini laparotomy    01/07/2023 Pathology Results   CASE: [id number] PATIENT: Ersel Avila Surgical Pathology Report  Reason for Addendum #1:  DNA Mismatch Repair IHC Results  Clinical History: advanced uterine cancer     FINAL MICROSCOPIC DIAGNOSIS:  A. UTERUS, CERVIX, BILATERAL FALLOPIAN TUBES AND OVARIES, RESECTION: - High-grade serous carcinoma, involving the entire endometrial surface, see comment - Carcinoma invades for a depth of 1.6 cm myometrial thickness is 1.7 cm (> 90%) - Cervical stroma is involved by carcinoma - No evidence of lymphovascular invasion - Carcinoma involves surface of bilateral ovaries and left fallopian tube - Benign leiomyomata - See oncology table  B. POSTERIOR CUL DE SAC, EXCISION: - High-grade serous carcinoma with psammomatous calcifications  C. LEFT PELVIC SIDEWALL, EXCISION: - High-grade serous carcinoma with psammomatous calcifications  D. OMENTUM, EXCISION: - High-grade serous carcinoma with psammomatous calcifications  COMMENT:  Immunohistochemical stains for p53 and p16 are diffusely positive in the tumor cells (clonal overexpression pattern), consistent with above diagnosis.  Dr. Kenyon Ana reviewed the case and concurs with the above diagnosis.  ONCOLOGY TABLE:  UTERUS, CARCINOMA OR CARCINOSARCOMA: Resection  Procedure: Total hysterectomy and bilateral salpingo-oophorectomy Histologic Type: Serous carcinoma Histologic Grade: High-grade Myometrial Invasion:      Depth of Myometrial Invasion (mm): 16 mm      Myometrial Thickness (mm): 17 mm      Percentage of Myometrial Invasion: >90% Uterine Serosa Involvement: Not identified Cervical stromal Involvement: Present Extent of involvement of other tissue/organs: Bilateral ovaries, left fallopian tube and pelvic sidewall Peritoneal/Ascitic Fluid: Not applicable Lymphovascular Invasion: Not identified Regional Lymph Nodes: Not applicable (no lymph nodes submitted or  found)  Distant Metastasis:      Distant Site(s) Involved: Omentum Pathologic Stage Classification (pTNM, AJCC 8th Edition): pT3a, pN not assigned Ancillary Studies: MMR / MSI testing will be ordered    01/07/2023 Surgery   OPERATIVE NOTE  Pre-operative Diagnosis: 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  On intra-abdominal entry, normal upper abdominal survey.  Omentum adherent to the sigmoid epiploica and the descending colon.  5 mm tumor implant along the left pelvic brim.  Normal-appearing bilateral tubes and ovaries although the sigmoid colon and epiploica adherent to the medial aspect of the left adnexa and posterior uterus.  Miliary disease noted over the anterior aspect of the sigmoid colon, rectum, and posterior cul-de-sac peritoneum.  Some obliteration of the deep posterior cul-de-sac.  On rectal exam, approximately 7-8 cm above the anal verge, just superior to the cervicovaginal junction, there is some tethering  of the rectum.  Some adhesions between the bladder and lower uterine segment.  Minimal pelvic ascites.  Medical comorbidities as well as what would have required a very low rectal resection and anastomosis to achieve complete cytoreduction with the need for a diverting ileostomy, decision made to remove the bulk of disease without bowel surgery.  Much of the pelvic peritoneum with miliary disease stripped within the cul-de-sac.   02/23/2023 Echocardiogram     1. Left ventricular ejection fraction, by estimation, is 60 to 65%. Left ventricular ejection fraction by 3D volume is 61 %. The left ventricle  has normal function. The left ventricle has no regional wall motion abnormalities. Left ventricular diastolic  parameters were normal. The average left ventricular global longitudinal strain is -20.9 %. The global longitudinal strain is normal.  2. Right ventricular systolic function is normal. The right ventricular size is normal. There is normal pulmonary artery systolic pressure. The estimated right ventricular systolic pressure is 25.7 mmHg.  3. The mitral valve is grossly normal. Trivial mitral valve regurgitation. No evidence of mitral stenosis.  4. The aortic valve is tricuspid. Aortic valve regurgitation is not visualized. No aortic stenosis is present.  5. The inferior vena cava is normal in size with greater than 50% respiratory variability, suggesting right atrial pressure of 3 mmHg.       PHYSICAL EXAMINATION: ECOG PERFORMANCE STATUS: 1 - Symptomatic but completely ambulatory  Vitals:   03/19/23 0928  BP: (!) 118/53  Pulse: (!) 52  Resp: 18  Temp: (!) 97.4 F (36.3 C)  SpO2: 99%   Filed Weights   03/19/23 0928  Weight: 179 lb 12.8 oz (81.6 kg)    GENERAL:alert, no distress and comfortable  LABORATORY DATA:  I have reviewed the data as listed    Component Value Date/Time   NA 141 03/19/2023 0904   NA 144 05/13/2018 1500   K 3.4 (L) 03/19/2023 0904   CL 106 03/19/2023 0904   CO2 28 03/19/2023 0904   GLUCOSE 91 03/19/2023 0904   BUN 16 03/19/2023 0904   BUN 13 05/13/2018 1500   CREATININE 0.69 03/19/2023 0904   CALCIUM 9.7 03/19/2023 0904   PROT 7.4 03/19/2023 0904   ALBUMIN 4.3 03/19/2023 0904   AST 14 (L) 03/19/2023 0904   ALT 11 03/19/2023 0904   ALKPHOS 61 03/19/2023 0904   BILITOT 0.4 03/19/2023 0904   GFRNONAA >60 03/19/2023 0904   GFRAA 110 05/13/2018 1500   GFRAA >60 07/21/2017 1131    No results found for: "SPEP", "UPEP"  Lab Results  Component Value Date   WBC 3.1 (L) 03/19/2023   NEUTROABS 0.7 (L) 03/19/2023   HGB 9.5 (L) 03/19/2023    HCT 27.7 (L) 03/19/2023   MCV 99.6 03/19/2023   PLT 117 (L) 03/19/2023      Chemistry      Component Value Date/Time   NA 141 03/19/2023 0904   NA 144 05/13/2018 1500   K 3.4 (L) 03/19/2023 0904   CL 106 03/19/2023 0904   CO2 28 03/19/2023 0904   BUN 16 03/19/2023 0904   BUN 13 05/13/2018 1500   CREATININE 0.69 03/19/2023 0904      Component Value Date/Time   CALCIUM 9.7 03/19/2023 0904   ALKPHOS 61 03/19/2023 0904   AST 14 (L) 03/19/2023 0904   ALT 11 03/19/2023 0904   BILITOT 0.4 03/19/2023 0904       RADIOGRAPHIC STUDIES: I have personally reviewed the radiological images as listed and agreed with  the findings in the report. ECHOCARDIOGRAM COMPLETE  Result Date: 02/20/2023    ECHOCARDIOGRAM REPORT   Patient Name:   Maria Avila Date of Exam: 02/20/2023 Medical Rec #:  [id number]     Height:       62.0 in Accession #:    [id number]    Weight:       180.1 lb Date of Birth:  Jan 28, 1951     BSA:          1.829 m Patient Age:    72 years      BP:           142/77 mmHg Patient Gender: F             HR:           77 bpm. Exam Location:  Outpatient Procedure: 2D Echo, 3D Echo, Cardiac Doppler, Color Doppler and Strain Analysis Indications:    Z51.11 Encounter for antineoplastic chemotheraphy  History:        Patient has prior history of Echocardiogram examinations, most                 recent 10/23/2022. Stroke, Signs/Symptoms:Dyspnea; Risk                 Factors:Hypertension. ETOH.  Sonographer:    Sheralyn Boatman RDCS Referring Phys: [id number] [facility]etina [facility]  Sonographer Comments: Patient is obese. Image acquisition challenging due to patient body habitus. IMPRESSIONS  1. Left ventricular ejection fraction, by estimation, is 60 to 65%. Left ventricular ejection fraction by 3D volume is 61 %. The left ventricle has normal function. The left ventricle has no regional wall motion abnormalities. Left ventricular diastolic  parameters were normal. The average left ventricular global longitudinal strain  is -20.9 %. The global longitudinal strain is normal.  2. Right ventricular systolic function is normal. The right ventricular size is normal. There is normal pulmonary artery systolic pressure. The estimated right ventricular systolic pressure is 25.7 mmHg.  3. The mitral valve is grossly normal. Trivial mitral valve regurgitation. No evidence of mitral stenosis.  4. The aortic valve is tricuspid. Aortic valve regurgitation is not visualized. No aortic stenosis is present.  5. The inferior vena cava is normal in size with greater than 50% respiratory variability, suggesting right atrial pressure of 3 mmHg. Comparison(s): No significant change from prior study. FINDINGS  Left Ventricle: Left ventricular ejection fraction, by estimation, is 60 to 65%. Left ventricular ejection fraction by 3D volume is 61 %. The left ventricle has normal function. The left ventricle has no regional wall motion abnormalities. The average left ventricular global longitudinal strain is -20.9 %. The global longitudinal strain is normal. The left ventricular internal cavity size was normal in size. There is no left ventricular hypertrophy. Left ventricular diastolic parameters were normal. Right Ventricle: The right ventricular size is normal. No increase in right ventricular wall thickness. Right ventricular systolic function is normal. There is normal pulmonary artery systolic pressure. The tricuspid regurgitant velocity is 2.38 m/s, and  with an assumed right atrial pressure of 3 mmHg, the estimated right ventricular systolic pressure is 25.7 mmHg. Left Atrium: Left atrial size was normal in size. Right Atrium: Right atrial size was normal in size. Prominent Chiari network. Pericardium: There is no evidence of pericardial effusion. Mitral Valve: The mitral valve is grossly normal. Trivial mitral valve regurgitation. No evidence of mitral valve stenosis. Tricuspid Valve: The tricuspid valve is grossly normal. Tricuspid valve  regurgitation is trivial. No evidence of  tricuspid stenosis. Aortic Valve: The aortic valve is tricuspid. Aortic valve regurgitation is not visualized. No aortic stenosis is present. Pulmonic Valve: The pulmonic valve was grossly normal. Pulmonic valve regurgitation is trivial. No evidence of pulmonic stenosis. Aorta: The aortic root and ascending aorta are structurally normal, with no evidence of dilitation. Venous: The inferior vena cava is normal in size with greater than 50% respiratory variability, suggesting right atrial pressure of 3 mmHg. IAS/Shunts: The atrial septum is grossly normal.  LEFT VENTRICLE PLAX 2D LVIDd:         3.90 cm         Diastology LVIDs:         2.50 cm         LV e' medial:    7.18 cm/s LV PW:         1.10 cm         LV E/e' medial:  10.2 LV IVS:        0.70 cm         LV e' lateral:   9.79 cm/s LVOT diam:     2.10 cm         LV E/e' lateral: 7.5 LV SV:         78 LV SV Index:   43              2D LVOT Area:     3.46 cm        Longitudinal                                Strain                                2D Strain GLS  -20.9 % LV Volumes (MOD)               Avg: LV vol d, MOD    72.5 ml A2C:                           3D Volume EF LV vol d, MOD    70.4 ml       LV 3D EF:    Left A4C:                                        ventricul LV vol s, MOD    27.2 ml                    ar A2C:                                        ejection LV vol s, MOD    24.1 ml                    fraction A4C:                                        by 3D LV SV MOD A2C:   45.3 ml  volume is LV SV MOD A4C:   70.4 ml                    61 %. LV SV MOD BP:    48.3 ml                                 3D Volume EF:                                3D EF:        61 %                                LV EDV:       99 ml                                LV ESV:       39 ml                                LV SV:        60 ml RIGHT VENTRICLE            IVC RV S prime:     8.27 cm/s  IVC diam: 2.40 cm TAPSE  (M-mode): 2.2 cm LEFT ATRIUM             Index        RIGHT ATRIUM          Index LA diam:        2.70 cm 1.48 cm/m   RA Area:     9.35 cm LA Vol (A2C):   43.3 ml 23.68 ml/m  RA Volume:   17.70 ml 9.68 ml/m LA Vol (A4C):   31.1 ml 17.01 ml/m LA Biplane Vol: 38.2 ml 20.89 ml/m  AORTIC VALVE             PULMONIC VALVE LVOT Vmax:   117.00 cm/s PR End Diast Vel: 1.73 msec LVOT Vmean:  71.100 cm/s LVOT VTI:    0.225 m  AORTA Ao Root diam: 3.10 cm Ao Asc diam:  3.50 cm MITRAL VALVE               TRICUSPID VALVE MV Area (PHT): 3.99 cm    TR Peak grad:   22.7 mmHg MV Decel Time: 190 msec    TR Vmax:        238.00 cm/s MV E velocity: 73.30 cm/s MV A velocity: 64.00 cm/s  SHUNTS MV E/A ratio:  1.15        Systemic VTI:  0.22 m                            Systemic Diam: 2.10 cm Lennie Odor MD Electronically signed by Lennie Odor MD Signature Date/Time: 02/20/2023/1:30:24 PM    Final

## 2023-03-19 NOTE — Assessment & Plan Note (Signed)
she has mild peripheral neuropathy, likely related to side effects of treatment. I plan to reduce the dose of treatment as outlined above.   

## 2023-03-19 NOTE — Assessment & Plan Note (Addendum)
She has developed worsening pancytopenia and slight neuropathy We discussed risk and benefits of continuing treatment without delay with minor dose adjustment and she agreed to proceed I will reduce the dose of paclitaxel and we will proceed without delay with additional neutropenic precaution She is made aware that she will be switched over to maintenance trastuzumab only in the future I plan to repeat imaging study at the end of the month for new staging

## 2023-03-19 NOTE — Assessment & Plan Note (Addendum)
Due to recent treatment She is not symptomatic We will proceed without delay, with mild dose reduction of paclitaxel I will order vitamin B12 level to rule out B12 deficiency as a contributing factor to pancytopenia We discussed neutropenic precaution

## 2023-03-19 NOTE — Patient Instructions (Signed)
[facility] - A DEPT OF [facility]  Discharge Instructions: Thank you for choosing [facility] to provide your oncology and hematology care.   If you have a lab appointment with the Cancer Center, please go directly to the Cancer Center and check in at the registration area.   Wear comfortable clothing and clothing appropriate for easy access to any Portacath or PICC line.   We strive to give you quality time with your provider. You may need to reschedule your appointment if you arrive late (15 or more minutes).  Arriving late affects you and other patients whose appointments are after yours.  Also, if you miss three or more appointments without notifying the office, you may be dismissed from the clinic at the provider's discretion.      For prescription refill requests, have your pharmacy contact our office and allow 72 hours for refills to be completed.    Today you received the following chemotherapy and/or immunotherapy agents: Kanjinti, Taxol, Carboplatin      To help prevent nausea and vomiting after your treatment, we encourage you to take your nausea medication as directed.  BELOW ARE SYMPTOMS THAT SHOULD BE REPORTED IMMEDIATELY: *FEVER GREATER THAN 100.4 F (38 C) OR HIGHER *CHILLS OR SWEATING *NAUSEA AND VOMITING THAT IS NOT CONTROLLED WITH YOUR NAUSEA MEDICATION *UNUSUAL SHORTNESS OF BREATH *UNUSUAL BRUISING OR BLEEDING *URINARY PROBLEMS (pain or burning when urinating, or frequent urination) *BOWEL PROBLEMS (unusual diarrhea, constipation, pain near the anus) TENDERNESS IN MOUTH AND THROAT WITH OR WITHOUT PRESENCE OF ULCERS (sore throat, sores in mouth, or a toothache) UNUSUAL RASH, SWELLING OR PAIN  UNUSUAL VAGINAL DISCHARGE OR ITCHING   Items with * indicate a potential emergency and should be followed up as soon as possible or go to the Emergency Department if any problems should occur.  Please show the CHEMOTHERAPY ALERT  CARD or IMMUNOTHERAPY ALERT CARD at check-in to the Emergency Department and triage nurse.  Should you have questions after your visit or need to cancel or reschedule your appointment, please contact [facility] - A DEPT OF [facility]  Dept: [phone]  and follow the prompts.  Office hours are 8:00 a.m. to 4:30 p.m. Monday - Friday. Please note that voicemails left after 4:00 p.m. may not be returned until the following business day.  We are closed weekends and major holidays. You have access to a nurse at all times for urgent questions. Please call the main number to the clinic Dept: [phone] and follow the prompts.   For any non-urgent questions, you may also contact your provider using MyChart. We now offer e-Visits for anyone 64 and older to request care online for non-urgent symptoms. For details visit [email or web address].   Also download the MyChart app! Go to the app store, search "MyChart", open the app, select [place], and log in with your MyChart username and password.

## 2023-04-03 ENCOUNTER — Ambulatory Visit (HOSPITAL_COMMUNITY)
Admission: RE | Admit: 2023-04-03 | Discharge: 2023-04-03 | Disposition: A | Discharge status: Home / Self Care | Payer: Medicare Other | Source: Ambulatory Visit | Attending: Hematology and Oncology | Admitting: Hematology and Oncology

## 2023-04-03 DIAGNOSIS — K5730 Diverticulosis of large intestine without perforation or abscess without bleeding: Secondary | ICD-10-CM | POA: Diagnosis not present

## 2023-04-03 DIAGNOSIS — K8020 Calculus of gallbladder without cholecystitis without obstruction: Secondary | ICD-10-CM | POA: Diagnosis not present

## 2023-04-03 DIAGNOSIS — D61818 Other pancytopenia: Secondary | ICD-10-CM | POA: Diagnosis not present

## 2023-04-03 DIAGNOSIS — C55 Malignant neoplasm of uterus, part unspecified: Secondary | ICD-10-CM | POA: Insufficient documentation

## 2023-04-03 MED ORDER — IOHEXOL 350 MG/ML SOLN
75.0000 mL | Freq: Once | INTRAVENOUS | Status: AC | PRN
  Administered 2023-04-03: 75 mL via INTRAVENOUS

## 2023-04-09 ENCOUNTER — Other Ambulatory Visit: Payer: Self-pay | Admitting: Hematology and Oncology

## 2023-04-09 ENCOUNTER — Inpatient Hospital Stay: Payer: Medicare Other

## 2023-04-09 ENCOUNTER — Inpatient Hospital Stay: Payer: Medicare Other | Attending: Gynecologic Oncology | Admitting: Hematology and Oncology

## 2023-04-09 ENCOUNTER — Other Ambulatory Visit: Payer: Self-pay

## 2023-04-09 ENCOUNTER — Telehealth: Payer: Self-pay

## 2023-04-09 ENCOUNTER — Encounter: Payer: Self-pay | Admitting: Hematology and Oncology

## 2023-04-09 VITALS — BP 118/54 | HR 70 | Temp 99.6°F | Resp 18 | Ht 62.0 in | Wt 177.0 lb

## 2023-04-09 DIAGNOSIS — R300 Dysuria: Secondary | ICD-10-CM | POA: Insufficient documentation

## 2023-04-09 DIAGNOSIS — C541 Malignant neoplasm of endometrium: Secondary | ICD-10-CM | POA: Diagnosis present

## 2023-04-09 DIAGNOSIS — C55 Malignant neoplasm of uterus, part unspecified: Secondary | ICD-10-CM

## 2023-04-09 DIAGNOSIS — Z5112 Encounter for antineoplastic immunotherapy: Secondary | ICD-10-CM | POA: Diagnosis not present

## 2023-04-09 DIAGNOSIS — C775 Secondary and unspecified malignant neoplasm of intrapelvic lymph nodes: Secondary | ICD-10-CM | POA: Diagnosis not present

## 2023-04-09 DIAGNOSIS — D61818 Other pancytopenia: Secondary | ICD-10-CM | POA: Diagnosis not present

## 2023-04-09 DIAGNOSIS — Z79620 Long term (current) use of immunosuppressive biologic: Secondary | ICD-10-CM | POA: Insufficient documentation

## 2023-04-09 DIAGNOSIS — C549 Malignant neoplasm of corpus uteri, unspecified: Secondary | ICD-10-CM

## 2023-04-09 LAB — URINALYSIS, COMPLETE (UACMP) WITH MICROSCOPIC
Bilirubin Urine: NEGATIVE
Glucose, UA: NEGATIVE mg/dL
Hgb urine dipstick: NEGATIVE
Ketones, ur: NEGATIVE mg/dL
Nitrite: NEGATIVE
Protein, ur: 30 mg/dL — AB
Specific Gravity, Urine: 1.024 (ref 1.005–1.030)
WBC, UA: >50 WBC/hpf (ref 0–5)
pH: 5 (ref 5.0–8.0)

## 2023-04-09 LAB — CBC WITH DIFFERENTIAL/PLATELET
Abs Immature Granulocytes: 0 10*3/uL (ref 0.00–0.07)
Basophils Absolute: 0 10*3/uL (ref 0.0–0.1)
Basophils Relative: 0 %
Eosinophils Absolute: 0 10*3/uL (ref 0.0–0.5)
Eosinophils Relative: 1 %
HCT: 27.2 % — ABNORMAL LOW (ref 36.0–46.0)
Hemoglobin: 9.2 g/dL — ABNORMAL LOW (ref 12.0–15.0)
Immature Granulocytes: 0 %
Lymphocytes Relative: 70 %
Lymphs Abs: 2.6 10*3/uL (ref 0.7–4.0)
MCH: 34.2 pg — ABNORMAL HIGH (ref 26.0–34.0)
MCHC: 33.8 g/dL (ref 30.0–36.0)
MCV: 101.1 fL — ABNORMAL HIGH (ref 80.0–100.0)
Monocytes Absolute: 0.3 10*3/uL (ref 0.1–1.0)
Monocytes Relative: 9 %
Neutro Abs: 0.8 10*3/uL — ABNORMAL LOW (ref 1.7–7.7)
Neutrophils Relative %: 20 %
Platelets: 103 10*3/uL — ABNORMAL LOW (ref 150–400)
RBC: 2.69 MIL/uL — ABNORMAL LOW (ref 3.87–5.11)
RDW: 14.2 % (ref 11.5–15.5)
Smear Review: NORMAL
WBC: 3.8 10*3/uL — ABNORMAL LOW (ref 4.0–10.5)
nRBC: 0 % (ref 0.0–0.2)

## 2023-04-09 LAB — COMPREHENSIVE METABOLIC PANEL
ALT: 10 U/L (ref 0–44)
AST: 15 U/L (ref 15–41)
Albumin: 4.3 g/dL (ref 3.5–5.0)
Alkaline Phosphatase: 52 U/L (ref 38–126)
Anion gap: 6 (ref 5–15)
BUN: 19 mg/dL (ref 8–23)
CO2: 29 mmol/L (ref 22–32)
Calcium: 9.6 mg/dL (ref 8.9–10.3)
Chloride: 107 mmol/L (ref 98–111)
Creatinine, Ser: 0.8 mg/dL (ref 0.44–1.00)
GFR, Estimated: >60 mL/min (ref >60)
Glucose, Bld: 96 mg/dL (ref 70–99)
Potassium: 3.6 mmol/L (ref 3.5–5.1)
Sodium: 142 mmol/L (ref 135–145)
Total Bilirubin: 0.3 mg/dL (ref <1.2)
Total Protein: 7.1 g/dL (ref 6.5–8.1)

## 2023-04-09 MED ORDER — NITROFURANTOIN MONOHYD MACRO 100 MG PO CAPS: 100.0000 mg | ORAL_CAPSULE | Freq: Two times a day (BID) | ORAL | 0 refills | Status: DC

## 2023-04-09 MED ORDER — HEPARIN SOD (PORK) LOCK FLUSH 100 UNIT/ML IV SOLN
500.0000 [IU] | Freq: Once | INTRAVENOUS | Status: AC | PRN
  Administered 2023-04-09: 500 [IU]

## 2023-04-09 MED ORDER — TRASTUZUMAB-ANNS CHEMO 150 MG IV SOLR
6.0000 mg/kg | Freq: Once | INTRAVENOUS | Status: AC
  Administered 2023-04-09: 483 mg via INTRAVENOUS
  Filled 2023-04-09: qty 23

## 2023-04-09 MED ORDER — SODIUM CHLORIDE 0.9% FLUSH
10.0000 mL | Freq: Once | INTRAVENOUS | Status: AC
  Administered 2023-04-09: 10 mL

## 2023-04-09 MED ORDER — SODIUM CHLORIDE 0.9% FLUSH
10.0000 mL | INTRAVENOUS | Status: DC | PRN
  Administered 2023-04-09: 10 mL

## 2023-04-09 MED ORDER — ACETAMINOPHEN 325 MG PO TABS
650.0000 mg | ORAL_TABLET | Freq: Once | ORAL | Status: AC
  Administered 2023-04-09: 650 mg via ORAL
  Filled 2023-04-09: qty 2

## 2023-04-09 MED ORDER — SODIUM CHLORIDE 0.9 % IV SOLN: Freq: Once | INTRAVENOUS | Status: AC

## 2023-04-09 NOTE — Patient Instructions (Signed)
[facility] - A DEPT OF [facility]  Discharge Instructions: Thank you for choosing [facility] to provide your oncology and hematology care.   If you have a lab appointment with the Cancer Center, please go directly to the Cancer Center and check in at the registration area.   Wear comfortable clothing and clothing appropriate for easy access to any Portacath or PICC line.   We strive to give you quality time with your provider. You may need to reschedule your appointment if you arrive late (15 or more minutes).  Arriving late affects you and other patients whose appointments are after yours.  Also, if you miss three or more appointments without notifying the office, you may be dismissed from the clinic at the provider's discretion.      For prescription refill requests, have your pharmacy contact our office and allow 72 hours for refills to be completed.    Today you received the following chemotherapy and/or immunotherapy agents Kanjinti      To help prevent nausea and vomiting after your treatment, we encourage you to take your nausea medication as directed.  BELOW ARE SYMPTOMS THAT SHOULD BE REPORTED IMMEDIATELY: *FEVER GREATER THAN 100.4 F (38 C) OR HIGHER *CHILLS OR SWEATING *NAUSEA AND VOMITING THAT IS NOT CONTROLLED WITH YOUR NAUSEA MEDICATION *UNUSUAL SHORTNESS OF BREATH *UNUSUAL BRUISING OR BLEEDING *URINARY PROBLEMS (pain or burning when urinating, or frequent urination) *BOWEL PROBLEMS (unusual diarrhea, constipation, pain near the anus) TENDERNESS IN MOUTH AND THROAT WITH OR WITHOUT PRESENCE OF ULCERS (sore throat, sores in mouth, or a toothache) UNUSUAL RASH, SWELLING OR PAIN  UNUSUAL VAGINAL DISCHARGE OR ITCHING   Items with * indicate a potential emergency and should be followed up as soon as possible or go to the Emergency Department if any problems should occur.  Please show the CHEMOTHERAPY ALERT CARD or IMMUNOTHERAPY  ALERT CARD at check-in to the Emergency Department and triage nurse.  Should you have questions after your visit or need to cancel or reschedule your appointment, please contact [facility] - [facility]  Dept: [phone]  and follow the prompts.  Office hours are 8:00 a.m. to 4:30 p.m. Monday - Friday. Please note that voicemails left after 4:00 p.m. may not be returned until the following business day.  We are closed weekends and major holidays. You have access to a nurse at all times for urgent questions. Please call the main number to the clinic Dept: [phone] and follow the prompts.   For any non-urgent questions, you may also contact your provider using MyChart. We now offer e-Visits for anyone 69 and older to request care online for non-urgent symptoms. For details visit [email or web address].   Also download the MyChart app! Go to the app store, search "MyChart", open the app, select [place], and log in with your MyChart username and password.

## 2023-04-09 NOTE — Assessment & Plan Note (Signed)
I have reviewed her imaging study which show excellent response to therapy There are no signs of residual disease She will continue maintenance trastuzumab She will be due for echocardiogram next month I plan to repeat imaging study again in 4 months, due around March 2025

## 2023-04-09 NOTE — Progress Notes (Signed)
[facility] OFFICE PROGRESS NOTE  Patient Care Team: Merri Brunette, MD as PCP - General (Internal Medicine) Paulina Fusi Servando Snare, RN as Oncology Nurse Navigator (Oncology)  ASSESSMENT & PLAN:  Uterine cancer [facility]) I have reviewed her imaging study which show excellent response to therapy There are no signs of residual disease She will continue maintenance trastuzumab She will be due for echocardiogram next month I plan to repeat imaging study again in 4 months, due around March 2025  Pancytopenia, acquired [facility]) Due to recent treatment She is not symptomatic I anticipate full recovery in the future after time away from chemotherapy  Dysuria I will order urinalysis and urine culture to rule out UTI  Orders Placed This Encounter  Procedures   Urine Culture    Standing Status:   Future    Number of Occurrences:   1    Standing Expiration Date:   04/08/2024   Urinalysis, Complete w Microscopic    Standing Status:   Future    Number of Occurrences:   1    Standing Expiration Date:   04/08/2024    All questions were answered. The patient knows to call the clinic with any problems, questions or concerns. The total time spent in the appointment was 40 minutes encounter with patients including review of chart and various tests results, discussions about plan of care and coordination of care plan   Artis Delay, MD 04/09/2023 11:33 AM  INTERVAL HISTORY: Please see below for problem oriented charting. she returns for further follow-up We spent a lot of time reviewing CT imaging Since last time I saw her, she felt well No recent infection although she complained of dysuria No evidence of hematuria  REVIEW OF SYSTEMS:   Constitutional: Denies fevers, chills or abnormal weight loss Eyes: Denies blurriness of vision Ears, nose, mouth, throat, and face: Denies mucositis or sore throat Respiratory: Denies cough, dyspnea or wheezes Cardiovascular: Denies palpitation, chest  discomfort or lower extremity swelling Gastrointestinal:  Denies nausea, heartburn or change in bowel habits Skin: Denies abnormal skin rashes Lymphatics: Denies new lymphadenopathy or easy bruising Neurological:Denies numbness, tingling or new weaknesses Behavioral/Psych: Mood is stable, no new changes  All other systems were reviewed with the patient and are negative.  I have reviewed the past medical history, past surgical history, social history and family history with the patient and they are unchanged from previous note.  ALLERGIES:  is allergic to crestor [rosuvastatin calcium] and zocor [simvastatin].  MEDICATIONS:  Current Outpatient Medications  Medication Sig Dispense Refill   acetaminophen (TYLENOL) 325 MG tablet Take 1-2 tablets (325-650 mg total) by mouth every 4 (four) hours as needed for mild pain.     benazepril-hydrochlorthiazide (LOTENSIN HCT) 20-25 MG tablet Take 1 tablet by mouth daily.     [employer] VITAMIN C 500 MG tablet TAKE 1 TABLET BY MOUTH TWICE A DAY 100 tablet 0   ezetimibe (ZETIA) 10 MG tablet Take 10 mg by mouth daily.     lidocaine-prilocaine (EMLA) cream Apply to affected area once (Patient taking differently: Apply 1 Application topically daily as needed (prior to port access).) 30 g 3   Multiple Vitamin (MULTIVITAMIN) capsule Take 1 capsule by mouth daily.     ondansetron (ZOFRAN) 8 MG tablet Take 1 tablet (8 mg total) by mouth every 8 (eight) hours as needed for nausea. 30 tablet 3   oxyCODONE (OXY IR/ROXICODONE) 5 MG immediate release tablet Take 1 tablet (5 mg total) by mouth every 4 (four) hours  as needed for severe pain (pain score 7-10). 30 tablet 0   prochlorperazine (COMPAZINE) 10 MG tablet Take 1 tablet (10 mg total) by mouth every 6 (six) hours as needed for nausea or vomiting. 30 tablet 1   thiamine (VITAMIN B-1) 100 MG tablet TAKE 1 TABLET BY MOUTH EVERY DAY 30 tablet 0   No current facility-administered medications for this visit.    Facility-Administered Medications Ordered in Other Visits  Medication Dose Route Frequency Provider Last Rate Last Admin   acetaminophen (TYLENOL) tablet 650 mg  650 mg Oral Once Bertis Ruddy, Rambo Sarafian, MD       heparin lock flush 100 unit/mL  500 Units Intracatheter Once PRN Bertis Ruddy, Vetra Shinall, MD       sodium chloride flush (NS) 0.9 % injection 10 mL  10 mL Intracatheter PRN Bertis Ruddy, Rakwon Letourneau, MD       trastuzumab-anns (KANJINTI) 483 mg in sodium chloride 0.9 % 250 mL chemo infusion  6 mg/kg (Treatment Plan Recorded) Intravenous Once Artis Delay, MD        SUMMARY OF ONCOLOGIC HISTORY: Oncology History Overview Note  MMR normal, Her2 positive, FISh 3+, MSI stable High grade serous PIK3CA pathogenic variant, p53 mutated   Uterine cancer (HCC)  08/12/2022 Initial Diagnosis   She presented with PMB   09/24/2022 Pathology Results   Diagnosis 1. Endometrium, curettage - HIGH GRADE SEROUS CARCINOMA (SEE NOTE) 2. Endometrium, resection - HIGH GRADE SEROUS CARCINOMA (SEE NOTE) Diagnosis Note 1. - immunohistochemical stain reveal tumor cells are positive for p16. P53 shows strong expression consistent with mutant type. This case was reviewed with Dr. Reynolds Bowl who agrees with the diagnosis   10/14/2022 Imaging   1. Ill-defined fullness of the uterus and endometrial cavity without discretely visualized mass, in keeping with patient's known endometrial malignancy. 2. Enlarged right iliac and pelvic sidewall lymph nodes, as well as enlarged epicardial lymph nodes or soft tissue nodules. 3. Scattered peritoneal and omental soft tissue nodularity, particularly notable in the left upper quadrant. 4. Soft tissue matting about the distal sigmoid colon and rectum in the low pelvis. 5. Constellation of findings is consistent with nodal, peritoneal, and omental metastatic disease. 6. Small right pleural effusion, nonspecific although modestly suspicious for a malignant effusion, however without directly visualized pleural mass or  nodularity. 7. Pancolonic diverticulosis, severe in the descending and sigmoid colon. 8. Cholelithiasis. 9. Coronary artery disease.   10/17/2022 Initial Diagnosis   Uterine cancer (HCC)   10/17/2022 Cancer Staging   Staging form: Corpus Uteri - Carcinoma and Carcinosarcoma, AJCC 8th Edition - Clinical stage from 10/17/2022: FIGO Stage IVB (cT1b, cN1, cM1) - Signed by Artis Delay, MD on 10/17/2022 Stage prefix: Initial diagnosis   10/23/2022 Echocardiogram   1. Left ventricular ejection fraction, by estimation, is 65 to 70%. Left ventricular ejection fraction by 2D MOD biplane is 68.1 %. The left ventricle has normal function. The left ventricle has no regional wall motion abnormalities. There is mild left ventricular hypertrophy. Left ventricular diastolic parameters are consistent with Grade I diastolic dysfunction (impaired relaxation).  2. Right ventricular systolic function is normal. The right ventricular size is normal. There is normal pulmonary artery systolic pressure. The estimated right ventricular systolic pressure is 27.6 mmHg.  3. The mitral valve is abnormal. Trivial mitral valve regurgitation.  4. The aortic valve is tricuspid. Aortic valve regurgitation is not visualized. Aortic valve sclerosis is present, with no evidence of aortic valve stenosis.  5. The inferior vena cava is normal in size with greater than  50% respiratory variability, suggesting right atrial pressure of 3 mmHg.   10/28/2022 -  Chemotherapy   Patient is on Treatment Plan : UTERINE SEROUS CARCINOMA Carboplatin + Paclitaxel + Trastuzumab q21d x 6 Cycles / Trastuzumab q21d     12/23/2022 Imaging   CT ABDOMEN PELVIS W CONTRAST  Result Date: 12/23/2022 CLINICAL DATA:  Follow-up endometrial cancer, status post chemotherapy EXAM: CT ABDOMEN AND PELVIS WITH CONTRAST TECHNIQUE: Multidetector CT imaging of the abdomen and pelvis was performed using the standard protocol following bolus administration of intravenous  contrast. RADIATION DOSE REDUCTION: This exam was performed according to the departmental dose-optimization program which includes automated exposure control, adjustment of the mA and/or kV according to patient size and/or use of iterative reconstruction technique. CONTRAST:  75mL OMNIPAQUE IOHEXOL 350 MG/ML SOLN COMPARISON:  10/14/2022 FINDINGS: Lower chest: Small right pleural effusion. Associated bilateral lower lobe atelectasis. Hepatobiliary: Liver is within normal limits. Layering small gallstones (series 3/image 27), without associated inflammatory changes. No intrahepatic or extrahepatic duct dilatation. Pancreas: Within normal limits. Spleen: Within normal limits. Adrenals/Urinary Tract: Adrenal glands are within normal limits. Kidneys are within normal limits.  No hydronephrosis. Bladder is within normal limits. Stomach/Bowel: Stomach is within normal limits. No evidence of bowel obstruction. Normal appendix (series 3/image 46). Left colonic diverticulosis, without evidence of diverticulitis. Vascular/Lymphatic: No evidence of abdominal aortic aneurysm. Atherosclerotic calcifications of the abdominal aorta and branch vessels, although vessels remain patent. Small right iliac nodes, including a dominant 10 mm short axis right obturator node (series 3/image 56), previously 12 mm. Reproductive: Mildly heterogeneous uterus with endometrial thickening measuring up to 16 mm (series 7/image 33), corresponding to the patient's known endometrial cancer. No adnexal masses. Other: No abdominopelvic ascites. Mild peritoneal disease/omental caking beneath the left mid abdominal wall (series 3/image 45), mildly improved. Minimal perirectal soft tissue in the left lower pelvis (series 3/image 58), improved. Musculoskeletal: Degenerative changes of the visualized thoracolumbar spine. IMPRESSION: Mildly heterogeneous uterus with endometrial thickening, corresponding to the patient's known endometrial cancer. Small right  pelvic nodal metastases, improved. Mild peritoneal disease/omental caking, improved Small right pleural effusion. Electronically Signed   By: Charline Bills M.D.   On: 12/23/2022 02:18      01/07/2023 Surgery   Robotic-assisted laparoscopic total hysterectomy with bilateral salpingo-oophorectomy, lysis of adhesions, tumor debulking including peritoneal stripping, omentectomy, mini laparotomy    01/07/2023 Pathology Results   CASE: [id number] PATIENT: Shilo [place] Surgical Pathology Report  Reason for Addendum #1:  DNA Mismatch Repair IHC Results  Clinical History: advanced uterine cancer     FINAL MICROSCOPIC DIAGNOSIS:  A. UTERUS, CERVIX, BILATERAL FALLOPIAN TUBES AND OVARIES, RESECTION: - High-grade serous carcinoma, involving the entire endometrial surface, see comment - Carcinoma invades for a depth of 1.6 cm myometrial thickness is 1.7 cm (> 90%) - Cervical stroma is involved by carcinoma - No evidence of lymphovascular invasion - Carcinoma involves surface of bilateral ovaries and left fallopian tube - Benign leiomyomata - See oncology table  B. POSTERIOR CUL DE SAC, EXCISION: - High-grade serous carcinoma with psammomatous calcifications  C. LEFT PELVIC SIDEWALL, EXCISION: - High-grade serous carcinoma with psammomatous calcifications  D. OMENTUM, EXCISION: - High-grade serous carcinoma with psammomatous calcifications  COMMENT:  Immunohistochemical stains for p53 and p16 are diffusely positive in the tumor cells (clonal overexpression pattern), consistent with above diagnosis.  Dr. Kenyon Ana reviewed the case and concurs with the above diagnosis.  ONCOLOGY TABLE:  UTERUS, CARCINOMA OR CARCINOSARCOMA: Resection  Procedure: Total hysterectomy and bilateral salpingo-oophorectomy Histologic Type:  Serous carcinoma Histologic Grade: High-grade Myometrial Invasion:      Depth of Myometrial Invasion (mm): 16 mm      Myometrial Thickness (mm): 17 mm       Percentage of Myometrial Invasion: >90% Uterine Serosa Involvement: Not identified Cervical stromal Involvement: Present Extent of involvement of other tissue/organs: Bilateral ovaries, left fallopian tube and pelvic sidewall Peritoneal/Ascitic Fluid: Not applicable Lymphovascular Invasion: Not identified Regional Lymph Nodes: Not applicable (no lymph nodes submitted or found)  Distant Metastasis:      Distant Site(s) Involved: Omentum Pathologic Stage Classification (pTNM, AJCC 8th Edition): pT3a, pN not assigned Ancillary Studies: MMR / MSI testing will be ordered    01/07/2023 Surgery   OPERATIVE NOTE  Pre-operative Diagnosis: 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  On intra-abdominal entry, normal upper abdominal survey.  Omentum adherent to the sigmoid epiploica and the descending colon.  5 mm tumor implant along the left pelvic brim.  Normal-appearing bilateral tubes and ovaries although the sigmoid colon and epiploica adherent to the medial aspect of the left adnexa and posterior uterus.  Miliary disease noted over the anterior aspect of the sigmoid colon, rectum, and posterior cul-de-sac peritoneum.  Some obliteration of the deep posterior cul-de-sac.  On rectal exam, approximately 7-8 cm above the anal verge, just superior to the cervicovaginal junction, there is some tethering of the rectum.  Some adhesions between the bladder and lower uterine segment.  Minimal pelvic ascites.  Medical comorbidities as well as what would have required a very low rectal resection and anastomosis to achieve complete  cytoreduction with the need for a diverting ileostomy, decision made to remove the bulk of disease without bowel surgery.  Much of the pelvic peritoneum with miliary disease stripped within the cul-de-sac.   02/23/2023 Echocardiogram     1. Left ventricular ejection fraction, by estimation, is 60 to 65%. Left ventricular ejection fraction by 3D volume is 61 %. The left ventricle has normal function. The left ventricle has no regional wall motion abnormalities. Left ventricular diastolic  parameters were normal. The average left ventricular global longitudinal strain is -20.9 %. The global longitudinal strain is normal.  2. Right ventricular systolic function is normal. The right ventricular size is normal. There is normal pulmonary artery systolic pressure. The estimated right ventricular systolic pressure is 25.7 mmHg.  3. The mitral valve is grossly normal. Trivial mitral valve regurgitation. No evidence of mitral stenosis.  4. The aortic valve is tricuspid. Aortic valve regurgitation is not visualized. No aortic stenosis is present.  5. The inferior vena cava is normal in size with greater than 50% respiratory variability, suggesting right atrial pressure of 3 mmHg.     04/03/2023 Imaging   CT CHEST ABDOMEN PELVIS W CONTRAST  Result Date: 04/09/2023 CLINICAL DATA:  Endometrial cancer monitoring. Assess response to chemotherapy. * Tracking Code: BO * EXAM: CT CHEST, ABDOMEN, AND PELVIS WITH CONTRAST TECHNIQUE: Multidetector CT imaging of the chest, abdomen and pelvis was performed following the standard protocol during bolus administration of intravenous contrast. RADIATION DOSE REDUCTION: This exam was performed according to the departmental dose-optimization program which includes automated exposure control, adjustment of the mA and/or kV according  to patient size and/or use of iterative reconstruction technique. CONTRAST:  75mL OMNIPAQUE IOHEXOL 350 MG/ML SOLN COMPARISON:  Abdomen pelvis CT  12/17/2022. Chest abdomen and pelvis CT 10/14/2022. FINDINGS: CT CHEST FINDINGS Cardiovascular: Right upper chest port in place. Tip seen to the level of the SVC right atrial junction. Heart is nonenlarged. Coronary artery calcifications are seen. The thoracic aorta has some scattered mild atherosclerotic calcified plaque. There is some ectasia of the distal aortic arch of up to 3.5 cm in diameter. No dissection or aneurysm formation otherwise no pericardial effusion. Mediastinum/Nodes: No enlarged mediastinal, hilar, or axillary lymph nodes. Thyroid gland, trachea, and esophagus demonstrate no significant findings. Previous anterior cardiophrenic angle lymph node today measures 6 mm in diameter, not significantly changed when adjusted for technique Lungs/Pleura: There is some linear opacity lung bases likely scar or atelectasis. No consolidation, pneumothorax or effusion. Trace right pleural effusion is decreased further from previous no dominant lung nodule. Musculoskeletal: Mild degenerative changes along the spine. Curvature. Prominent degenerative changes of the shoulders as well. CT ABDOMEN PELVIS FINDINGS Hepatobiliary: Stones in the nondilated gallbladder. No space-occupying liver lesion. Patent portal vein. Pancreas: Unremarkable. No pancreatic ductal dilatation or surrounding inflammatory changes. Spleen: Normal in size without focal abnormality. Adrenals/Urinary Tract: Adrenal glands are unremarkable. Kidneys are normal, without renal calculi, focal lesion, or hydronephrosis. Bladder is unremarkable. Stomach/Bowel: Oral contrast was administered. Large bowel has a normal course and caliber. Diffuse diverticula identified along the colon greatest along the descending and sigmoid region. Stomach and small bowel are nondilated. Vascular/Lymphatic: Normal caliber aorta and IVC. Scattered atherosclerotic calcifications identified along the aorta and branch vessels. Previous right pelvic sidewall, iliac node  which measured 10 mm in short axis, today on series 3, image 99 measures 8 mm. No new abnormal lymph node enlargement identified in the abdomen and pelvis Reproductive: Uterus is no longer seen. Please correlate with the history. Rounded areas low-density soft tissue right adnexa on series 3, image 101 measures 2.7 by 2.1 cm. This could be postsurgical. Recommend close follow-up. Other: The nodular areas seen along the mesentery such as along the left side near the margin of the descending colon have decreased. Few residual smaller areas such as series 3, image 70 anterior to the descending colon. No developing ascites Musculoskeletal: Curvature of the spine. Scattered degenerative changes of the spine and pelvis. New nodular area of wall thickening along the anterior abdominal wall left side series 3, image 80 measuring 16 mm IMPRESSION: Interval hysterectomy with some low-density nodular tissue in the right adnexa. Attention on close follow up. Decreasing mesenteric areas of nodular thickening with some residual along the left side in particular. No developing ascites. Small right pelvic sidewall lymph node is decreasing in size today compared to previous pre-surgical CT. No new lymph node enlargement identified in the chest, abdomen and pelvis. New nodular areas of thickening along the left side of the anterior abdominal wall. Possibilities include related to recent surgery. Attention on follow-up. Gallstones.  Colonic diverticula Electronically Signed   By: Karen Kays M.D.   On: 04/09/2023 11:06        PHYSICAL EXAMINATION: ECOG PERFORMANCE STATUS: 1 - Symptomatic but completely ambulatory  Vitals:   04/09/23 1046  BP: (!) 118/54  Pulse: 70  Resp: 18  Temp: 99.6 F (37.6 C)  SpO2: 100%   Filed Weights   04/09/23 1046  Weight: 177 lb (80.3 kg)    GENERAL:alert, no distress and comfortable   LABORATORY DATA:  I have reviewed  the data as listed    Component Value Date/Time   NA 142  04/09/2023 1027   NA 144 05/13/2018 1500   K 3.6 04/09/2023 1027   CL 107 04/09/2023 1027   CO2 29 04/09/2023 1027   GLUCOSE 96 04/09/2023 1027   BUN 19 04/09/2023 1027   BUN 13 05/13/2018 1500   CREATININE 0.80 04/09/2023 1027   CREATININE 0.69 03/19/2023 0904   CALCIUM 9.6 04/09/2023 1027   PROT 7.1 04/09/2023 1027   ALBUMIN 4.3 04/09/2023 1027   AST 15 04/09/2023 1027   AST 14 (L) 03/19/2023 0904   ALT 10 04/09/2023 1027   ALT 11 03/19/2023 0904   ALKPHOS 52 04/09/2023 1027   BILITOT 0.3 04/09/2023 1027   BILITOT 0.4 03/19/2023 0904   GFRNONAA >60 04/09/2023 1027   GFRNONAA >60 03/19/2023 0904   GFRAA 110 05/13/2018 1500   GFRAA >60 07/21/2017 1131    No results found for: "SPEP", "UPEP"  Lab Results  Component Value Date   WBC 3.8 (L) 04/09/2023   NEUTROABS 0.8 (L) 04/09/2023   HGB 9.2 (L) 04/09/2023   HCT 27.2 (L) 04/09/2023   MCV 101.1 (H) 04/09/2023   PLT 103 (L) 04/09/2023      Chemistry      Component Value Date/Time   NA 142 04/09/2023 1027   NA 144 05/13/2018 1500   K 3.6 04/09/2023 1027   CL 107 04/09/2023 1027   CO2 29 04/09/2023 1027   BUN 19 04/09/2023 1027   BUN 13 05/13/2018 1500   CREATININE 0.80 04/09/2023 1027   CREATININE 0.69 03/19/2023 0904      Component Value Date/Time   CALCIUM 9.6 04/09/2023 1027   ALKPHOS 52 04/09/2023 1027   AST 15 04/09/2023 1027   AST 14 (L) 03/19/2023 0904   ALT 10 04/09/2023 1027   ALT 11 03/19/2023 0904   BILITOT 0.3 04/09/2023 1027   BILITOT 0.4 03/19/2023 0904       RADIOGRAPHIC STUDIES: I have reviewed multiple imaging studies with the patient I have personally reviewed the radiological images as listed and agreed with the findings in the report. CT CHEST ABDOMEN PELVIS W CONTRAST  Result Date: 04/09/2023 CLINICAL DATA:  Endometrial cancer monitoring. Assess response to chemotherapy. * Tracking Code: BO * EXAM: CT CHEST, ABDOMEN, AND PELVIS WITH CONTRAST TECHNIQUE: Multidetector CT imaging of the  chest, abdomen and pelvis was performed following the standard protocol during bolus administration of intravenous contrast. RADIATION DOSE REDUCTION: This exam was performed according to the departmental dose-optimization program which includes automated exposure control, adjustment of the mA and/or kV according to patient size and/or use of iterative reconstruction technique. CONTRAST:  75mL OMNIPAQUE IOHEXOL 350 MG/ML SOLN COMPARISON:  Abdomen pelvis CT 12/17/2022. Chest abdomen and pelvis CT 10/14/2022. FINDINGS: CT CHEST FINDINGS Cardiovascular: Right upper chest port in place. Tip seen to the level of the SVC right atrial junction. Heart is nonenlarged. Coronary artery calcifications are seen. The thoracic aorta has some scattered mild atherosclerotic calcified plaque. There is some ectasia of the distal aortic arch of up to 3.5 cm in diameter. No dissection or aneurysm formation otherwise no pericardial effusion. Mediastinum/Nodes: No enlarged mediastinal, hilar, or axillary lymph nodes. Thyroid gland, trachea, and esophagus demonstrate no significant findings. Previous anterior cardiophrenic angle lymph node today measures 6 mm in diameter, not significantly changed when adjusted for technique Lungs/Pleura: There is some linear opacity lung bases likely scar or atelectasis. No consolidation, pneumothorax or effusion. Trace right pleural effusion  is decreased further from previous no dominant lung nodule. Musculoskeletal: Mild degenerative changes along the spine. Curvature. Prominent degenerative changes of the shoulders as well. CT ABDOMEN PELVIS FINDINGS Hepatobiliary: Stones in the nondilated gallbladder. No space-occupying liver lesion. Patent portal vein. Pancreas: Unremarkable. No pancreatic ductal dilatation or surrounding inflammatory changes. Spleen: Normal in size without focal abnormality. Adrenals/Urinary Tract: Adrenal glands are unremarkable. Kidneys are normal, without renal calculi, focal  lesion, or hydronephrosis. Bladder is unremarkable. Stomach/Bowel: Oral contrast was administered. Large bowel has a normal course and caliber. Diffuse diverticula identified along the colon greatest along the descending and sigmoid region. Stomach and small bowel are nondilated. Vascular/Lymphatic: Normal caliber aorta and IVC. Scattered atherosclerotic calcifications identified along the aorta and branch vessels. Previous right pelvic sidewall, iliac node which measured 10 mm in short axis, today on series 3, image 99 measures 8 mm. No new abnormal lymph node enlargement identified in the abdomen and pelvis Reproductive: Uterus is no longer seen. Please correlate with the history. Rounded areas low-density soft tissue right adnexa on series 3, image 101 measures 2.7 by 2.1 cm. This could be postsurgical. Recommend close follow-up. Other: The nodular areas seen along the mesentery such as along the left side near the margin of the descending colon have decreased. Few residual smaller areas such as series 3, image 70 anterior to the descending colon. No developing ascites Musculoskeletal: Curvature of the spine. Scattered degenerative changes of the spine and pelvis. New nodular area of wall thickening along the anterior abdominal wall left side series 3, image 80 measuring 16 mm IMPRESSION: Interval hysterectomy with some low-density nodular tissue in the right adnexa. Attention on close follow up. Decreasing mesenteric areas of nodular thickening with some residual along the left side in particular. No developing ascites. Small right pelvic sidewall lymph node is decreasing in size today compared to previous pre-surgical CT. No new lymph node enlargement identified in the chest, abdomen and pelvis. New nodular areas of thickening along the left side of the anterior abdominal wall. Possibilities include related to recent surgery. Attention on follow-up. Gallstones.  Colonic diverticula Electronically Signed   By:  Karen Kays M.D.   On: 04/09/2023 11:06

## 2023-04-09 NOTE — Telephone Encounter (Signed)
-----   Message from Artis Delay sent at 04/09/2023  1:37 PM EST ----- Her UA is not normal I sent prescription antibiotics to her pharmacy Will call again after cultures are finalized Please let her know

## 2023-04-09 NOTE — Telephone Encounter (Signed)
Placed call to pt to advise of rx. She was informed a different may be indicated pending her urine culture but out office would call her and let her knows. She understands and has no further concerns.

## 2023-04-09 NOTE — Assessment & Plan Note (Signed)
I will order urinalysis and urine culture to rule out UTI

## 2023-04-09 NOTE — Assessment & Plan Note (Signed)
Due to recent treatment She is not symptomatic I anticipate full recovery in the future after time away from chemotherapy

## 2023-04-10 LAB — CA 125: Cancer Antigen (CA) 125: 71.6 U/mL — ABNORMAL HIGH (ref 0.0–38.1)

## 2023-04-11 LAB — URINE CULTURE: Culture: 50000 — AB

## 2023-04-13 ENCOUNTER — Telehealth: Payer: Self-pay

## 2023-04-13 NOTE — Telephone Encounter (Signed)
-----   Message from Artis Delay sent at 04/13/2023  6:59 AM EST ----- Her urine culture is positive Can you call if she is better? If not, switch her antibiotics to amoxicillin 500 mg BID for 7 days, call to her pharmacy. If better, no need to change

## 2023-04-13 NOTE — Telephone Encounter (Signed)
Called and given below message. She verbalized understanding and appreciated the call. She is feeling much better and will call the office back for questions/concerns.

## 2023-04-17 ENCOUNTER — Telehealth: Payer: Self-pay | Admitting: *Deleted

## 2023-04-17 NOTE — Progress Notes (Signed)
Radiation Oncology         [phone] ________________________________  Name: Maria Avila MRN: [id number]  Date: 04/20/2023  DOB: 06-08-1950  Vaginal Brachytherapy Procedure Note  CC: Merri Brunette, MD Carver Fila, MD  No diagnosis found.  Diagnosis: The encounter diagnosis was Endometrial cancer (HCC).   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 ***  She has continued to receive chemotherapy under Dr. Bertis Ruddy. She continues to tolerate treatment well other than dysuria.   ***  ALLERGIES: is allergic to crestor [rosuvastatin calcium] and zocor [simvastatin].  Meds: Current Outpatient Medications  Medication Sig Dispense Refill   acetaminophen (TYLENOL) 325 MG tablet Take 1-2 tablets (325-650 mg total) by mouth every 4 (four) hours as needed for mild pain.     benazepril-hydrochlorthiazide (LOTENSIN HCT) 20-25 MG tablet Take 1 tablet by mouth daily.     [facility] VITAMIN C 500 MG tablet TAKE 1 TABLET BY MOUTH TWICE A DAY 100 tablet 0   ezetimibe (ZETIA) 10 MG tablet Take 10 mg by mouth daily.     lidocaine-prilocaine (EMLA) cream Apply to affected area once (Patient taking differently: Apply 1 Application topically daily as needed (prior to port access).) 30 g 3   Multiple Vitamin (MULTIVITAMIN) capsule Take 1 capsule by mouth daily.     nitrofurantoin, macrocrystal-monohydrate, (MACROBID) 100 MG capsule Take 1 capsule (100 mg total) by mouth 2 (two) times daily. 14 capsule 0   ondansetron (ZOFRAN) 8 MG tablet Take 1 tablet (8 mg total) by mouth every 8 (eight) hours as needed for nausea. 30 tablet 3   oxyCODONE (OXY IR/ROXICODONE) 5 MG  immediate release tablet Take 1 tablet (5 mg total) by mouth every 4 (four) hours as needed for severe pain (pain score 7-10). 30 tablet 0   prochlorperazine (COMPAZINE) 10 MG tablet Take 1 tablet (10 mg total) by mouth every 6 (six) hours as needed for nausea or vomiting. 30 tablet 1   thiamine (VITAMIN B-1) 100 MG tablet TAKE 1 TABLET BY MOUTH EVERY DAY 30 tablet 0   No current facility-administered medications for this encounter.    Physical Findings: The patient is in no acute distress. Patient is alert and oriented.  vitals were not taken for this visit.   No palpable cervical, supraclavicular or axillary lymphoadenopathy. The heart has a regular rate and rhythm. The lungs are clear to auscultation. Abdomen soft and non-tender.  On pelvic examination the external genitalia were unremarkable. A speculum exam was performed. Vaginal cuff intact, no mucosal lesions. On bimanual exam there were no pelvic masses appreciated.  Lab Findings: Lab Results  Component Value Date   WBC 3.8 (L) 04/09/2023   HGB 9.2 (L) 04/09/2023   HCT 27.2 (L) 04/09/2023   MCV 101.1 (H) 04/09/2023   PLT 103 (L) 04/09/2023    Radiographic Findings: CT CHEST ABDOMEN PELVIS W CONTRAST Result Date: 04/09/2023 CLINICAL DATA:  Endometrial cancer monitoring. Assess response to chemotherapy. * Tracking Code: BO * EXAM: CT CHEST, ABDOMEN, AND PELVIS WITH CONTRAST TECHNIQUE: Multidetector CT imaging of the chest, abdomen and pelvis was performed following the standard protocol during bolus administration of intravenous contrast. RADIATION DOSE REDUCTION: This exam was performed according to the departmental dose-optimization program which includes automated  exposure control, adjustment of the mA and/or kV according to patient size and/or use of iterative reconstruction technique. CONTRAST:  75mL OMNIPAQUE IOHEXOL 350 MG/ML SOLN COMPARISON:  Abdomen pelvis CT 12/17/2022. Chest abdomen and pelvis CT 10/14/2022. FINDINGS: CT  CHEST FINDINGS Cardiovascular: Right upper chest port in place. Tip seen to the level of the SVC right atrial junction. Heart is nonenlarged. Coronary artery calcifications are seen. The thoracic aorta has some scattered mild atherosclerotic calcified plaque. There is some ectasia of the distal aortic arch of up to 3.5 cm in diameter. No dissection or aneurysm formation otherwise no pericardial effusion. Mediastinum/Nodes: No enlarged mediastinal, hilar, or axillary lymph nodes. Thyroid gland, trachea, and esophagus demonstrate no significant findings. Previous anterior cardiophrenic angle lymph node today measures 6 mm in diameter, not significantly changed when adjusted for technique Lungs/Pleura: There is some linear opacity lung bases likely scar or atelectasis. No consolidation, pneumothorax or effusion. Trace right pleural effusion is decreased further from previous no dominant lung nodule. Musculoskeletal: Mild degenerative changes along the spine. Curvature. Prominent degenerative changes of the shoulders as well. CT ABDOMEN PELVIS FINDINGS Hepatobiliary: Stones in the nondilated gallbladder. No space-occupying liver lesion. Patent portal vein. Pancreas: Unremarkable. No pancreatic ductal dilatation or surrounding inflammatory changes. Spleen: Normal in size without focal abnormality. Adrenals/Urinary Tract: Adrenal glands are unremarkable. Kidneys are normal, without renal calculi, focal lesion, or hydronephrosis. Bladder is unremarkable. Stomach/Bowel: Oral contrast was administered. Large bowel has a normal course and caliber. Diffuse diverticula identified along the colon greatest along the descending and sigmoid region. Stomach and small bowel are nondilated. Vascular/Lymphatic: Normal caliber aorta and IVC. Scattered atherosclerotic calcifications identified along the aorta and branch vessels. Previous right pelvic sidewall, iliac node which measured 10 mm in short axis, today on series 3, image 99  measures 8 mm. No new abnormal lymph node enlargement identified in the abdomen and pelvis Reproductive: Uterus is no longer seen. Please correlate with the history. Rounded areas low-density soft tissue right adnexa on series 3, image 101 measures 2.7 by 2.1 cm. This could be postsurgical. Recommend close follow-up. Other: The nodular areas seen along the mesentery such as along the left side near the margin of the descending colon have decreased. Few residual smaller areas such as series 3, image 70 anterior to the descending colon. No developing ascites Musculoskeletal: Curvature of the spine. Scattered degenerative changes of the spine and pelvis. New nodular area of wall thickening along the anterior abdominal wall left side series 3, image 80 measuring 16 mm IMPRESSION: Interval hysterectomy with some low-density nodular tissue in the right adnexa. Attention on close follow up. Decreasing mesenteric areas of nodular thickening with some residual along the left side in particular. No developing ascites. Small right pelvic sidewall lymph node is decreasing in size today compared to previous pre-surgical CT. No new lymph node enlargement identified in the chest, abdomen and pelvis. New nodular areas of thickening along the left side of the anterior abdominal wall. Possibilities include related to recent surgery. Attention on follow-up. Gallstones.  Colonic diverticula Electronically Signed   By: Karen Kays M.D.   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 The patient will be treated with a *** cm diameter  cylinder with a treatment length of *** cm.  This distended the vaginal vault without undue discomfort. The patient tolerated the procedure well.  The patient was successfully fitted for a vaginal cylinder. The patient is appropriate to begin vaginal brachytherapy.   Plan: The patient will proceed with CT simulation and vaginal brachytherapy today.    _______________________________   Billie Lade, PhD, MD  This document serves as a record of services personally performed by Antony Blackbird, MD. It was created on his behalf by Neena Rhymes, a trained medical scribe. The creation of this record is based on the scribe's personal observations and the provider's statements to them. This document has been checked and approved by the attending provider.

## 2023-04-17 NOTE — Telephone Encounter (Signed)
Called patient to remind of New HDR [facility] for 04-20-23- arrival time- 7:45 am @ [facility], spoke with patient and she is aware of these appts.

## 2023-04-19 NOTE — Progress Notes (Signed)
  Radiation Oncology         [phone] ________________________________  Name: Maria Avila MRN: [id number]  Date: 04/20/2023  DOB: [birth year]  CC: Merri Brunette, MD  Carver Fila, MD  HDR BRACHYTHERAPY NOTE  DIAGNOSIS: The encounter diagnosis was Endometrial cancer [facility]).   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 She will be treated with a 2.5 cm diameter segmented cylinder. This conforms to her anatomy without undue discomfort.  Vaginal brachytherapy procedure node: The patient was brought to the HDR suite. Identity was confirmed. All relevant records and images related to the planned course of therapy were reviewed. The patient freely provided informed written consent to proceed with treatment after reviewing the details related to the planned course of therapy. The consent form was witnessed and verified by the simulation staff. Then, the patient was set-up in a stable reproducible supine position for radiation therapy. Pelvic exam revealed the vaginal cuff to be intact . The patient's custom vaginal cylinder was placed in the proximal vagina. This was affixed to the CT/MR stabilization plate to prevent slippage. Patient tolerated the placement well.  Verification simulation note:  A fiducial marker was placed within the vaginal cylinder. An AP and lateral film was then obtained through the pelvis area. This documented accurate position of the vaginal cylinder for treatment.  HDR BRACHYTHERAPY TREATMENT  The remote afterloading device was affixed to the vaginal cylinder by catheter. Patient then proceeded to  undergo her first high-dose-rate treatment directed at the proximal vagina. The patient was prescribed a dose of 6.0 gray to be delivered to the mucosal surface. Treatment length was 3.0 cm. Patient was treated with 1 channel using 7 dwell positions. Treatment time was 242.2 seconds. Iridium 192 was the high-dose-rate source for treatment. The patient tolerated the treatment well. After completion of her therapy, a radiation survey was performed documenting return of the iridium source into the [employer] safe.   PLAN: The patient will return later this week for her second high-dose-rate treatment.  ________________________________  -----------------------------------  Billie Lade, PhD, MD  This document serves as a record of services personally performed by Antony Blackbird, MD. It was created on his behalf by Neena Rhymes, a trained medical scribe. The creation of this record is based on the scribe's personal observations and the provider's statements to them. This document has been checked and approved by the attending provider.

## 2023-04-20 ENCOUNTER — Ambulatory Visit
Admission: RE | Admit: 2023-04-20 | Discharge: 2023-04-20 | Discharge status: Home / Self Care | Payer: Medicare Other | Source: Ambulatory Visit | Attending: Radiation Oncology | Admitting: Radiation Oncology

## 2023-04-20 ENCOUNTER — Ambulatory Visit
Admission: RE | Admit: 2023-04-20 | Discharge: 2023-04-20 | Disposition: A | Discharge status: Home / Self Care | Payer: Medicare Other | Source: Ambulatory Visit | Attending: Radiation Oncology | Admitting: Radiation Oncology

## 2023-04-20 ENCOUNTER — Encounter: Payer: Self-pay | Admitting: Radiation Oncology

## 2023-04-20 ENCOUNTER — Other Ambulatory Visit: Payer: Self-pay

## 2023-04-20 VITALS — BP 143/76 | HR 63 | Temp 97.3°F | Resp 20 | Ht 62.0 in | Wt 180.4 lb

## 2023-04-20 DIAGNOSIS — C55 Malignant neoplasm of uterus, part unspecified: Secondary | ICD-10-CM

## 2023-04-20 DIAGNOSIS — C772 Secondary and unspecified malignant neoplasm of intra-abdominal lymph nodes: Secondary | ICD-10-CM | POA: Insufficient documentation

## 2023-04-20 DIAGNOSIS — C541 Malignant neoplasm of endometrium: Secondary | ICD-10-CM | POA: Diagnosis present

## 2023-04-20 DIAGNOSIS — C539 Malignant neoplasm of cervix uteri, unspecified: Secondary | ICD-10-CM

## 2023-04-20 LAB — RAD ONC ARIA SESSION SUMMARY
Course Elapsed Days: 0
Plan Fractions Treated to Date: 1
Plan Prescribed Dose Per Fraction: 6 Gy
Plan Total Fractions Prescribed: 5
Plan Total Prescribed Dose: 30 Gy
Reference Point Dosage Given to Date: 6 Gy
Reference Point Session Dosage Given: 6 Gy
Session Number: 1

## 2023-04-20 NOTE — Progress Notes (Signed)
Maria Avila is here today for follow up new vaginal cylinder fitting.   Does the patient complain of any of the following:  Pain:No Abdominal bloating: No Diarrhea/Constipation: No Nausea/Vomiting: No Vaginal Discharge: No Blood in Urine or Stool: No Urinary Issues (dysuria/incomplete emptying/ incontinence/ increased frequency/urgency): No   Additional comments if applicable:   BP (!) 143/76 (BP Location: Left Arm, Patient Position: Sitting)   Pulse 63   Temp (!) 97.3 F (36.3 C) (Temporal)   Resp 20   Ht 5\' 2"  (1.575 m)   Wt 180 lb 6 oz (81.8 kg)   SpO2 100%   BMI 32.99 kg/m

## 2023-04-22 ENCOUNTER — Telehealth: Payer: Self-pay | Admitting: *Deleted

## 2023-04-22 NOTE — Progress Notes (Signed)
  Radiation Oncology         [phone] ________________________________  Name: Maria Avila MRN: [id number]  Date: 04/23/2023  DOB: Apr 02, 1951  CC: Merri Brunette, MD  Carver Fila, MD  HDR BRACHYTHERAPY NOTE  DIAGNOSIS: The encounter diagnosis was Endometrial cancer [facility]).   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 She will be treated with a 2.5 cm diameter segmented cylinder. This conforms to her anatomy without undue discomfort.  Vaginal brachytherapy procedure node: The patient was brought to the HDR suite. Identity was confirmed. All relevant records and images related to the planned course of therapy were reviewed. The patient freely provided informed written consent to proceed with treatment after reviewing the details related to the planned course of therapy. The consent form was witnessed and verified by the simulation staff. Then, the patient was set-up in a stable reproducible supine position for radiation therapy. Pelvic exam revealed the vaginal cuff to be intact . The patient's custom vaginal cylinder was placed in the proximal vagina. This was affixed to the CT/MR stabilization plate to prevent slippage. Patient tolerated the placement well.  Verification simulation note:  A fiducial marker was placed within the vaginal cylinder. An AP and lateral film was then obtained through the pelvis area. This documented accurate position of the vaginal cylinder for treatment.  HDR BRACHYTHERAPY TREATMENT  The remote afterloading device was affixed to the vaginal cylinder by catheter. Patient then proceeded to  undergo her second high-dose-rate treatment directed at the proximal vagina. The patient was prescribed a dose of 6.0 gray to be delivered to the mucosal surface. Treatment length was 3.0 cm. Patient was treated with 1 channel using 7 dwell positions. Treatment time was 249.1 seconds. Iridium 192 was the high-dose-rate source for treatment. The patient tolerated the treatment well. After completion of her therapy, a radiation survey was performed documenting return of the iridium source into the [employer] safe.   PLAN: The patient will return on 05/04/23 for her third high-dose-rate treatment.  ________________________________  -----------------------------------  Billie Lade, PhD, MD  This document serves as a record of services personally performed by Antony Blackbird, MD. It was created on his behalf by Neena Rhymes, a trained medical scribe. The creation of this record is based on the scribe's personal observations and the provider's statements to them. This document has been checked and approved by the attending provider.

## 2023-04-22 NOTE — Telephone Encounter (Signed)
CALLED PATIENT TO REMIND OF HDR TX. FOR 04-23-23 @ 10 AM, SPOKE WITH PATIENT AND SHE IS AWARE OF THIS TX.

## 2023-04-23 ENCOUNTER — Other Ambulatory Visit: Payer: Self-pay

## 2023-04-23 ENCOUNTER — Ambulatory Visit
Admission: RE | Admit: 2023-04-23 | Discharge: 2023-04-23 | Disposition: A | Discharge status: Home / Self Care | Payer: Medicare Other | Source: Ambulatory Visit | Attending: Radiation Oncology | Admitting: Radiation Oncology

## 2023-04-23 ENCOUNTER — Encounter: Payer: Self-pay | Admitting: Hematology and Oncology

## 2023-04-23 DIAGNOSIS — C772 Secondary and unspecified malignant neoplasm of intra-abdominal lymph nodes: Secondary | ICD-10-CM | POA: Diagnosis not present

## 2023-04-23 DIAGNOSIS — C541 Malignant neoplasm of endometrium: Secondary | ICD-10-CM | POA: Diagnosis not present

## 2023-04-23 DIAGNOSIS — C539 Malignant neoplasm of cervix uteri, unspecified: Secondary | ICD-10-CM

## 2023-04-23 LAB — RAD ONC ARIA SESSION SUMMARY
Course Elapsed Days: 3
Plan Fractions Treated to Date: 2
Plan Prescribed Dose Per Fraction: 6 Gy
Plan Total Fractions Prescribed: 5
Plan Total Prescribed Dose: 30 Gy
Reference Point Dosage Given to Date: 12 Gy
Reference Point Session Dosage Given: 6 Gy
Session Number: 2

## 2023-05-01 ENCOUNTER — Telehealth: Payer: Self-pay | Admitting: *Deleted

## 2023-05-01 NOTE — Telephone Encounter (Signed)
CALLED PATIENT TO REMIND OF HDR TX. FOR 05-04-23 @ 9 AM, SPOKE WITH PATIENT AND SHE IS AWARE OF THIS TX.

## 2023-05-02 NOTE — Progress Notes (Signed)
  Radiation Oncology         [phone] ________________________________  Name: Maria Avila MRN: [id number]  Date: 05/04/2023  DOB: Dec 16, 1950  CC: Merri Brunette, MD  Carver Fila, MD  HDR BRACHYTHERAPY NOTE  DIAGNOSIS: The encounter diagnosis was Endometrial cancer [facility]).   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 She will be treated with a 2.5 cm diameter segmented cylinder. This conforms to her anatomy without undue discomfort.  Vaginal brachytherapy procedure node: The patient was brought to the HDR suite. Identity was confirmed. All relevant records and images related to the planned course of therapy were reviewed. The patient freely provided informed written consent to proceed with treatment after reviewing the details related to the planned course of therapy. The consent form was witnessed and verified by the simulation staff. Then, the patient was set-up in a stable reproducible supine position for radiation therapy. Pelvic exam revealed the vaginal cuff to be intact . The patient's custom vaginal cylinder was placed in the proximal vagina. This was affixed to the CT/MR stabilization plate to prevent slippage. Patient tolerated the placement well.  Verification simulation note:  A fiducial marker was placed within the vaginal cylinder. An AP and lateral film was then obtained through the pelvis area. This documented accurate position of the vaginal cylinder for treatment.  HDR BRACHYTHERAPY TREATMENT  The remote afterloading device was affixed to the vaginal cylinder by catheter. Patient then proceeded to  undergo her third high-dose-rate treatment directed at the proximal vagina. The patient was prescribed a dose of 6.0 gray to be delivered to the mucosal surface. Treatment length was 3.0 cm. Patient was treated with 1 channel using 7 dwell positions. Treatment time was 276.3 seconds. Iridium 192 was the high-dose-rate source for treatment. The patient tolerated the treatment well. After completion of her therapy, a radiation survey was performed documenting return of the iridium source into the [employer] safe.   PLAN: The patient will return on 05/07/23 for her fourth high-dose-rate treatment.  ________________________________  -----------------------------------  Billie Lade, PhD, MD  This document serves as a record of services personally performed by Antony Blackbird, MD. It was created on his behalf by Neena Rhymes, a trained medical scribe. The creation of this record is based on the scribe's personal observations and the provider's statements to them. This document has been checked and approved by the attending provider.

## 2023-05-04 ENCOUNTER — Ambulatory Visit
Admission: RE | Admit: 2023-05-04 | Discharge: 2023-05-04 | Disposition: A | Discharge status: Home / Self Care | Payer: Medicare Other | Source: Ambulatory Visit | Attending: Radiation Oncology | Admitting: Radiation Oncology

## 2023-05-04 ENCOUNTER — Other Ambulatory Visit: Payer: Self-pay

## 2023-05-04 DIAGNOSIS — C539 Malignant neoplasm of cervix uteri, unspecified: Secondary | ICD-10-CM

## 2023-05-04 DIAGNOSIS — C541 Malignant neoplasm of endometrium: Secondary | ICD-10-CM | POA: Diagnosis not present

## 2023-05-04 DIAGNOSIS — C772 Secondary and unspecified malignant neoplasm of intra-abdominal lymph nodes: Secondary | ICD-10-CM | POA: Diagnosis not present

## 2023-05-04 LAB — RAD ONC ARIA SESSION SUMMARY
Course Elapsed Days: 14
Plan Fractions Treated to Date: 3
Plan Prescribed Dose Per Fraction: 6 Gy
Plan Total Fractions Prescribed: 5
Plan Total Prescribed Dose: 30 Gy
Reference Point Dosage Given to Date: 18 Gy
Reference Point Session Dosage Given: 6 Gy
Session Number: 3

## 2023-05-05 ENCOUNTER — Telehealth: Payer: Self-pay | Admitting: *Deleted

## 2023-05-05 NOTE — Telephone Encounter (Signed)
CALLED PATIENT TO REMIND OF HDR TX. FOR 05-07-23 @ 10 AM, SPOKE WITH PATIENT AND SHE IS AWARE OF THIS APPT.

## 2023-05-06 NOTE — Progress Notes (Signed)
  Radiation Oncology         ([phone]) [phone] ________________________________  Name: Maria Avila MRN: [id number]  Date: 05/07/2023  DOB: [birth year]  CC: Clarice Nottingham, MD  Viktoria Comer SAUNDERS, MD  HDR BRACHYTHERAPY NOTE  DIAGNOSIS: The encounter diagnosis was Endometrial cancer [facility]).   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 She will be treated with a 2.5 cm diameter segmented cylinder. This conforms to her anatomy without undue discomfort.  Vaginal brachytherapy procedure node: The patient was brought to the HDR suite. Identity was confirmed. All relevant records and images related to the planned course of therapy were reviewed. The patient freely provided informed written consent to proceed with treatment after reviewing the details related to the planned course of therapy. The consent form was witnessed and verified by the simulation staff. Then, the patient was set-up in a stable reproducible supine position for radiation therapy. Pelvic exam revealed the vaginal cuff to be intact . The patient's custom vaginal cylinder was placed in the proximal vagina. This was affixed to the CT/MR stabilization plate to prevent slippage. Patient tolerated the placement well.  Verification simulation note:  A fiducial marker was placed within the vaginal cylinder. An AP and lateral film was then obtained through the pelvis area. This documented accurate position of the vaginal cylinder for treatment.  HDR BRACHYTHERAPY TREATMENT  The remote afterloading device was affixed to the vaginal cylinder by catheter. Patient then proceeded to  undergo her fourth high-dose-rate treatment directed at the proximal vagina. The patient was prescribed a dose of 6.0 gray to be delivered to the mucosal surface. Treatment length was 3.0 cm. Patient was treated with 1 channel using 7 dwell positions. Treatment time was 284.1 seconds. Iridium 192 was the high-dose-rate source for treatment. The patient tolerated the treatment well. After completion of her therapy, a radiation survey was performed documenting return of the iridium source into the [employer] safe.   PLAN: The patient will return on 05/11/23 for her fifth high-dose-rate treatment.  ________________________________  -----------------------------------  Lynwood CHARM Nasuti, PhD, MD  This document serves as a record of services personally performed by Lynwood Nasuti, MD. It was created on his behalf by Dorthy Fuse, a trained medical scribe. The creation of this record is based on the scribe's personal observations and the provider's statements to them. This document has been checked and approved by the attending provider.

## 2023-05-07 ENCOUNTER — Ambulatory Visit
Admission: RE | Admit: 2023-05-07 | Discharge: 2023-05-07 | Disposition: A | Discharge status: Home / Self Care | Payer: Medicare Other | Source: Ambulatory Visit | Attending: Radiation Oncology | Admitting: Radiation Oncology

## 2023-05-07 ENCOUNTER — Other Ambulatory Visit: Payer: Self-pay

## 2023-05-07 DIAGNOSIS — C539 Malignant neoplasm of cervix uteri, unspecified: Secondary | ICD-10-CM | POA: Insufficient documentation

## 2023-05-07 DIAGNOSIS — C538 Malignant neoplasm of overlapping sites of cervix uteri: Secondary | ICD-10-CM | POA: Diagnosis present

## 2023-05-07 LAB — RAD ONC ARIA SESSION SUMMARY
Course Elapsed Days: 17
Plan Fractions Treated to Date: 4
Plan Prescribed Dose Per Fraction: 6 Gy
Plan Total Fractions Prescribed: 5
Plan Total Prescribed Dose: 30 Gy
Reference Point Dosage Given to Date: 24 Gy
Reference Point Session Dosage Given: 6 Gy
Session Number: 4

## 2023-05-08 ENCOUNTER — Inpatient Hospital Stay: Payer: Medicare Other | Attending: Gynecologic Oncology

## 2023-05-08 ENCOUNTER — Telehealth: Payer: Self-pay

## 2023-05-08 ENCOUNTER — Inpatient Hospital Stay: Payer: Medicare Other

## 2023-05-08 ENCOUNTER — Inpatient Hospital Stay: Payer: Medicare Other | Admitting: Hematology and Oncology

## 2023-05-08 ENCOUNTER — Encounter: Payer: Self-pay | Admitting: Hematology and Oncology

## 2023-05-08 ENCOUNTER — Telehealth: Payer: Self-pay | Admitting: *Deleted

## 2023-05-08 VITALS — BP 133/79 | HR 53 | Temp 98.9°F | Resp 18 | Ht 62.0 in | Wt 179.6 lb

## 2023-05-08 VITALS — BP 136/72 | HR 56 | Resp 16

## 2023-05-08 DIAGNOSIS — Z79620 Long term (current) use of immunosuppressive biologic: Secondary | ICD-10-CM | POA: Diagnosis not present

## 2023-05-08 DIAGNOSIS — C775 Secondary and unspecified malignant neoplasm of intrapelvic lymph nodes: Secondary | ICD-10-CM | POA: Insufficient documentation

## 2023-05-08 DIAGNOSIS — C55 Malignant neoplasm of uterus, part unspecified: Secondary | ICD-10-CM

## 2023-05-08 DIAGNOSIS — C7963 Secondary malignant neoplasm of bilateral ovaries: Secondary | ICD-10-CM | POA: Diagnosis not present

## 2023-05-08 DIAGNOSIS — C541 Malignant neoplasm of endometrium: Secondary | ICD-10-CM | POA: Insufficient documentation

## 2023-05-08 DIAGNOSIS — C539 Malignant neoplasm of cervix uteri, unspecified: Secondary | ICD-10-CM | POA: Diagnosis not present

## 2023-05-08 DIAGNOSIS — Z5112 Encounter for antineoplastic immunotherapy: Secondary | ICD-10-CM | POA: Diagnosis not present

## 2023-05-08 DIAGNOSIS — D61818 Other pancytopenia: Secondary | ICD-10-CM | POA: Diagnosis not present

## 2023-05-08 LAB — COMPREHENSIVE METABOLIC PANEL
ALT: 9 U/L (ref 0–44)
AST: 14 U/L — ABNORMAL LOW (ref 15–41)
Albumin: 4.3 g/dL (ref 3.5–5.0)
Alkaline Phosphatase: 53 U/L (ref 38–126)
Anion gap: 5 (ref 5–15)
BUN: 16 mg/dL (ref 8–23)
CO2: 30 mmol/L (ref 22–32)
Calcium: 9.7 mg/dL (ref 8.9–10.3)
Chloride: 105 mmol/L (ref 98–111)
Creatinine, Ser: 0.67 mg/dL (ref 0.44–1.00)
GFR, Estimated: >60 mL/min (ref >60)
Glucose, Bld: 100 mg/dL — ABNORMAL HIGH (ref 70–99)
Potassium: 3.4 mmol/L — ABNORMAL LOW (ref 3.5–5.1)
Sodium: 140 mmol/L (ref 135–145)
Total Bilirubin: 0.4 mg/dL (ref 0.0–1.2)
Total Protein: 7.2 g/dL (ref 6.5–8.1)

## 2023-05-08 LAB — CBC WITH DIFFERENTIAL/PLATELET
Abs Immature Granulocytes: 0 10*3/uL (ref 0.00–0.07)
Basophils Absolute: 0 10*3/uL (ref 0.0–0.1)
Basophils Relative: 0 %
Eosinophils Absolute: 0.1 10*3/uL (ref 0.0–0.5)
Eosinophils Relative: 2 %
HCT: 29 % — ABNORMAL LOW (ref 36.0–46.0)
Hemoglobin: 10 g/dL — ABNORMAL LOW (ref 12.0–15.0)
Immature Granulocytes: 0 %
Lymphocytes Relative: 61 %
Lymphs Abs: 2.3 10*3/uL (ref 0.7–4.0)
MCH: 34.8 pg — ABNORMAL HIGH (ref 26.0–34.0)
MCHC: 34.5 g/dL (ref 30.0–36.0)
MCV: 101 fL — ABNORMAL HIGH (ref 80.0–100.0)
Monocytes Absolute: 0.3 10*3/uL (ref 0.1–1.0)
Monocytes Relative: 7 %
Neutro Abs: 1.1 10*3/uL — ABNORMAL LOW (ref 1.7–7.7)
Neutrophils Relative %: 30 %
Platelets: 148 10*3/uL — ABNORMAL LOW (ref 150–400)
RBC: 2.87 MIL/uL — ABNORMAL LOW (ref 3.87–5.11)
RDW: 14.6 % (ref 11.5–15.5)
WBC: 3.8 10*3/uL — ABNORMAL LOW (ref 4.0–10.5)
nRBC: 0 % (ref 0.0–0.2)

## 2023-05-08 MED ORDER — TRASTUZUMAB-ANNS CHEMO 150 MG IV SOLR
6.0000 mg/kg | Freq: Once | INTRAVENOUS | Status: AC
  Administered 2023-05-08: 483 mg via INTRAVENOUS
  Filled 2023-05-08: qty 23

## 2023-05-08 MED ORDER — HEPARIN SOD (PORK) LOCK FLUSH 100 UNIT/ML IV SOLN
500.0000 [IU] | Freq: Once | INTRAVENOUS | Status: AC | PRN
  Administered 2023-05-08: 500 [IU]

## 2023-05-08 MED ORDER — ACETAMINOPHEN 325 MG PO TABS
650.0000 mg | ORAL_TABLET | Freq: Once | ORAL | Status: AC
Start: 2023-05-08 — End: 2023-05-08
  Administered 2023-05-08: 650 mg via ORAL
  Filled 2023-05-08: qty 2

## 2023-05-08 MED ORDER — SODIUM CHLORIDE 0.9% FLUSH
10.0000 mL | INTRAVENOUS | Status: DC | PRN
  Administered 2023-05-08: 10 mL

## 2023-05-08 MED ORDER — SODIUM CHLORIDE 0.9% FLUSH
10.0000 mL | Freq: Once | INTRAVENOUS | Status: AC
Start: 2023-05-08 — End: 2023-05-08
  Administered 2023-05-08: 10 mL

## 2023-05-08 MED ORDER — SODIUM CHLORIDE 0.9 % IV SOLN
Freq: Once | INTRAVENOUS | Status: AC
Start: 2023-05-08 — End: 2023-05-08

## 2023-05-08 NOTE — Telephone Encounter (Signed)
 Called her to give echo appt on 1/22 at 10 am at [facility], arrive at 0945. She is aware of appt.

## 2023-05-08 NOTE — Progress Notes (Signed)
 [facility] OFFICE PROGRESS NOTE  Patient Care Team: Clarice Nottingham, MD as PCP - General (Internal Medicine) Devona Darice SAUNDERS, RN as Oncology Nurse Navigator (Oncology)  ASSESSMENT & PLAN:  Uterine cancer [facility]) Her last imaging study showed excellent response to therapy There are no signs of residual disease She will continue maintenance trastuzumab  She will be due for echocardiogram in a few weeks I plan to repeat imaging study again in 4 months, due around March 2025  Pancytopenia, acquired [facility]) Due to recent treatment She is not symptomatic I anticipate full recovery in the future after time away from chemotherapy  Orders Placed This Encounter  Procedures   ECHOCARDIOGRAM COMPLETE    Standing Status:   Future    Expected Date:   05/22/2023    Expiration Date:   05/07/2024    Perflutren DEFINITY (image enhancing agent) should be administered unless hypersensitivity or allergy exist:   Administer Perflutren    Where should this test be performed:   [facility]    Reason for exam-Echo:   Chemo  Z09    All questions were answered. The patient knows to call the clinic with any problems, questions or concerns. The total time spent in the appointment was 30 minutes encounter with patients including review of chart and various tests results, discussions about plan of care and coordination of care plan   [name], MD 05/08/2023 3:08 PM  INTERVAL HISTORY: Please see below for problem oriented charting. she returns for treatment and follow-up She is doing well and denies side effects from recent treatment No recent signs or symptoms of congestive heart failure She is completing radiation treatment No recent infection, fever or chills  REVIEW OF SYSTEMS:   Constitutional: Denies fevers, chills or abnormal weight loss Eyes: Denies blurriness of vision Ears, nose, mouth, throat, and face: Denies mucositis or sore throat Respiratory: Denies cough, dyspnea or  wheezes Cardiovascular: Denies palpitation, chest discomfort or lower extremity swelling Gastrointestinal:  Denies nausea, heartburn or change in bowel habits Skin: Denies abnormal skin rashes Lymphatics: Denies new lymphadenopathy or easy bruising Neurological:Denies numbness, tingling or new weaknesses Behavioral/Psych: Mood is stable, no new changes  All other systems were reviewed with the patient and are negative.  I have reviewed the past medical history, past surgical history, social history and family history with the patient and they are unchanged from previous note.  ALLERGIES:  is allergic to crestor [rosuvastatin calcium ] and zocor [simvastatin].  MEDICATIONS:  Current Outpatient Medications  Medication Sig Dispense Refill   acetaminophen  (TYLENOL ) 325 MG tablet Take 1-2 tablets (325-650 mg total) by mouth every 4 (four) hours as needed for mild pain.     benazepril -hydrochlorthiazide (LOTENSIN  HCT) 20-25 MG tablet Take 1 tablet by mouth daily.     [employer] VITAMIN C  500 MG tablet TAKE 1 TABLET BY MOUTH TWICE A DAY 100 tablet 0   ezetimibe  (ZETIA ) 10 MG tablet Take 10 mg by mouth daily.     lidocaine -prilocaine  (EMLA ) cream Apply to affected area once (Patient taking differently: Apply 1 Application topically daily as needed (prior to port access).) 30 g 3   Multiple Vitamin (MULTIVITAMIN) capsule Take 1 capsule by mouth daily.     ondansetron  (ZOFRAN ) 8 MG tablet Take 1 tablet (8 mg total) by mouth every 8 (eight) hours as needed for nausea. 30 tablet 3   prochlorperazine  (COMPAZINE ) 10 MG tablet Take 1 tablet (10 mg total) by mouth every 6 (six) hours as needed for nausea or  vomiting. 30 tablet 1   thiamine  (VITAMIN B-1) 100 MG tablet TAKE 1 TABLET BY MOUTH EVERY DAY 30 tablet 0   No current facility-administered medications for this visit.   Facility-Administered Medications Ordered in Other Visits  Medication Dose Route Frequency Provider Last Rate Last Admin   sodium  chloride flush (NS) 0.9 % injection 10 mL  10 mL Intracatheter PRN Lonn, Winston Misner, MD   10 mL at 05/08/23 1434    SUMMARY OF ONCOLOGIC HISTORY: Oncology History Overview Note  MMR normal, Her2 positive, FISh 3+, MSI stable High grade serous PIK3CA pathogenic variant, p53 mutated   Uterine cancer (HCC)  08/12/2022 Initial Diagnosis   She presented with PMB   09/24/2022 Pathology Results   Diagnosis 1. Endometrium, curettage - HIGH GRADE SEROUS CARCINOMA (SEE NOTE) 2. Endometrium, resection - HIGH GRADE SEROUS CARCINOMA (SEE NOTE) Diagnosis Note 1. - immunohistochemical stain reveal tumor cells are positive for p16. P53 shows strong expression consistent with mutant type. This case was reviewed with Dr. Macie who agrees with the diagnosis   10/14/2022 Imaging   1. Ill-defined fullness of the uterus and endometrial cavity without discretely visualized mass, in keeping with patient's known endometrial malignancy. 2. Enlarged right iliac and pelvic sidewall lymph nodes, as well as enlarged epicardial lymph nodes or soft tissue nodules. 3. Scattered peritoneal and omental soft tissue nodularity, particularly notable in the left upper quadrant. 4. Soft tissue matting about the distal sigmoid colon and rectum in the low pelvis. 5. Constellation of findings is consistent with nodal, peritoneal, and omental metastatic disease. 6. Small right pleural effusion, nonspecific although modestly suspicious for a malignant effusion, however without directly visualized pleural mass or nodularity. 7. Pancolonic diverticulosis, severe in the descending and sigmoid colon. 8. Cholelithiasis. 9. Coronary artery disease.   10/17/2022 Initial Diagnosis   Uterine cancer (HCC)   10/17/2022 Cancer Staging   Staging form: Corpus Uteri - Carcinoma and Carcinosarcoma, AJCC 8th Edition - Clinical stage from 10/17/2022: FIGO Stage IVB (cT1b, cN1, cM1) - Signed by Lonn Hicks, MD on 10/17/2022 Stage prefix: Initial  diagnosis   10/23/2022 Echocardiogram   1. Left ventricular ejection fraction, by estimation, is 65 to 70%. Left ventricular ejection fraction by 2D MOD biplane is 68.1 %. The left ventricle has normal function. The left ventricle has no regional wall motion abnormalities. There is mild left ventricular hypertrophy. Left ventricular diastolic parameters are consistent with Grade I diastolic dysfunction (impaired relaxation).  2. Right ventricular systolic function is normal. The right ventricular size is normal. There is normal pulmonary artery systolic pressure. The estimated right ventricular systolic pressure is 27.6 mmHg.  3. The mitral valve is abnormal. Trivial mitral valve regurgitation.  4. The aortic valve is tricuspid. Aortic valve regurgitation is not visualized. Aortic valve sclerosis is present, with no evidence of aortic valve stenosis.  5. The inferior vena cava is normal in size with greater than 50% respiratory variability, suggesting right atrial pressure of 3 mmHg.   10/28/2022 -  Chemotherapy   Patient is on Treatment Plan : UTERINE SEROUS CARCINOMA Carboplatin  + Paclitaxel  + Trastuzumab  q21d x 6 Cycles / Trastuzumab  q21d     12/23/2022 Imaging   CT ABDOMEN PELVIS W CONTRAST  Result Date: 12/23/2022 CLINICAL DATA:  Follow-up endometrial cancer, status post chemotherapy EXAM: CT ABDOMEN AND PELVIS WITH CONTRAST TECHNIQUE: Multidetector CT imaging of the abdomen and pelvis was performed using the standard protocol following bolus administration of intravenous contrast. RADIATION DOSE REDUCTION: This exam was performed according  to the departmental dose-optimization program which includes automated exposure control, adjustment of the mA and/or kV according to patient size and/or use of iterative reconstruction technique. CONTRAST:  75mL OMNIPAQUE  IOHEXOL  350 MG/ML SOLN COMPARISON:  10/14/2022 FINDINGS: Lower chest: Small right pleural effusion. Associated bilateral lower lobe  atelectasis. Hepatobiliary: Liver is within normal limits. Layering small gallstones (series 3/image 27), without associated inflammatory changes. No intrahepatic or extrahepatic duct dilatation. Pancreas: Within normal limits. Spleen: Within normal limits. Adrenals/Urinary Tract: Adrenal glands are within normal limits. Kidneys are within normal limits.  No hydronephrosis. Bladder is within normal limits. Stomach/Bowel: Stomach is within normal limits. No evidence of bowel obstruction. Normal appendix (series 3/image 46). Left colonic diverticulosis, without evidence of diverticulitis. Vascular/Lymphatic: No evidence of abdominal aortic aneurysm. Atherosclerotic calcifications of the abdominal aorta and branch vessels, although vessels remain patent. Small right iliac nodes, including a dominant 10 mm short axis right obturator node (series 3/image 56), previously 12 mm. Reproductive: Mildly heterogeneous uterus with endometrial thickening measuring up to 16 mm (series 7/image 33), corresponding to the patient's known endometrial cancer. No adnexal masses. Other: No abdominopelvic ascites. Mild peritoneal disease/omental caking beneath the left mid abdominal wall (series 3/image 45), mildly improved. Minimal perirectal soft tissue in the left lower pelvis (series 3/image 58), improved. Musculoskeletal: Degenerative changes of the visualized thoracolumbar spine. IMPRESSION: Mildly heterogeneous uterus with endometrial thickening, corresponding to the patient's known endometrial cancer. Small right pelvic nodal metastases, improved. Mild peritoneal disease/omental caking, improved Small right pleural effusion. Electronically Signed   By: Pinkie Pebbles M.D.   On: 12/23/2022 02:18      01/07/2023 Surgery   Robotic-assisted laparoscopic total hysterectomy with bilateral salpingo-oophorectomy, lysis of adhesions, tumor debulking including peritoneal stripping, omentectomy, mini laparotomy    01/07/2023 Pathology  Results   CASE: [id number] PATIENT: Lavonna Marasco Surgical Pathology Report  Reason for Addendum #1:  DNA Mismatch Repair IHC Results  Clinical History: advanced uterine cancer     FINAL MICROSCOPIC DIAGNOSIS:  A. UTERUS, CERVIX, BILATERAL FALLOPIAN TUBES AND OVARIES, RESECTION: - High-grade serous carcinoma, involving the entire endometrial surface, see comment - Carcinoma invades for a depth of 1.6 cm myometrial thickness is 1.7 cm (> 90%) - Cervical stroma is involved by carcinoma - No evidence of lymphovascular invasion - Carcinoma involves surface of bilateral ovaries and left fallopian tube - Benign leiomyomata - See oncology table  B. POSTERIOR CUL DE SAC, EXCISION: - High-grade serous carcinoma with psammomatous calcifications  C. LEFT PELVIC SIDEWALL, EXCISION: - High-grade serous carcinoma with psammomatous calcifications  D. OMENTUM, EXCISION: - High-grade serous carcinoma with psammomatous calcifications  COMMENT:  Immunohistochemical stains for p53 and p16 are diffusely positive in the tumor cells (clonal overexpression pattern), consistent with above diagnosis.  Dr. LeGolvan reviewed the case and concurs with the above diagnosis.  ONCOLOGY TABLE:  UTERUS, CARCINOMA OR CARCINOSARCOMA: Resection  Procedure: Total hysterectomy and bilateral salpingo-oophorectomy Histologic Type: Serous carcinoma Histologic Grade: High-grade Myometrial Invasion:      Depth of Myometrial Invasion (mm): 16 mm      Myometrial Thickness (mm): 17 mm      Percentage of Myometrial Invasion: >90% Uterine Serosa Involvement: Not identified Cervical stromal Involvement: Present Extent of involvement of other tissue/organs: Bilateral ovaries, left fallopian tube and pelvic sidewall Peritoneal/Ascitic Fluid: Not applicable Lymphovascular Invasion: Not identified Regional Lymph Nodes: Not applicable (no lymph nodes submitted or found)  Distant Metastasis:      Distant Site(s)  Involved: Omentum Pathologic Stage Classification (pTNM, AJCC 8th Edition):  pT3a, pN not assigned Ancillary Studies: MMR / MSI testing will be ordered    01/07/2023 Surgery   OPERATIVE NOTE  Pre-operative Diagnosis: Stage IVB uterine serous carcinoma s/p 3 cycles of NACT     Post-operative Diagnosis: same, diverticulosis   Operation: Robotic-assisted laparoscopic total hysterectomy with bilateral salpingo-oophorectomy, lysis of adhesions, tumor debulking including peritoneal stripping, omentectomy, mini laparotomy   Surgeon: Viktoria Crank MD    Urine Output: 200 cc   Operative Findings: On EUA, somewhat mobile small uterus.  On intra-abdominal entry, normal upper abdominal survey.  Omentum adherent to the sigmoid epiploica and the descending colon.  5 mm tumor implant along the left pelvic brim.  Normal-appearing bilateral tubes and ovaries although the sigmoid colon and epiploica adherent to the medial aspect of the left adnexa and posterior uterus.  Miliary disease noted over the anterior aspect of the sigmoid colon, rectum, and posterior cul-de-sac peritoneum.  Some obliteration of the deep posterior cul-de-sac.  On rectal exam, approximately 7-8 cm above the anal verge, just superior to the cervicovaginal junction, there is some tethering of the rectum.  Some adhesions between the bladder and lower uterine segment.  Minimal pelvic ascites.  Medical comorbidities as well as what would have required a very low rectal resection and anastomosis to achieve complete cytoreduction with the need for a diverting ileostomy, decision made to remove the bulk of disease without bowel surgery.  Much of the pelvic peritoneum with miliary disease stripped within the cul-de-sac.   02/23/2023 Echocardiogram     1. Left ventricular ejection fraction, by estimation, is 60 to 65%. Left ventricular ejection fraction by 3D volume is 61 %. The left ventricle has normal function. The left ventricle has no  regional wall motion abnormalities. Left ventricular diastolic  parameters were normal. The average left ventricular global longitudinal strain is -20.9 %. The global longitudinal strain is normal.  2. Right ventricular systolic function is normal. The right ventricular size is normal. There is normal pulmonary artery systolic pressure. The estimated right ventricular systolic pressure is 25.7 mmHg.  3. The mitral valve is grossly normal. Trivial mitral valve regurgitation. No evidence of mitral stenosis.  4. The aortic valve is tricuspid. Aortic valve regurgitation is not visualized. No aortic stenosis is present.  5. The inferior vena cava is normal in size with greater than 50% respiratory variability, suggesting right atrial pressure of 3 mmHg.     04/03/2023 Imaging   CT CHEST ABDOMEN PELVIS W CONTRAST  Result Date: 04/09/2023 CLINICAL DATA:  Endometrial cancer monitoring. Assess response to chemotherapy. * Tracking Code: BO * EXAM: CT CHEST, ABDOMEN, AND PELVIS WITH CONTRAST TECHNIQUE: Multidetector CT imaging of the chest, abdomen and pelvis was performed following the standard protocol during bolus administration of intravenous contrast. RADIATION DOSE REDUCTION: This exam was performed according to the departmental dose-optimization program which includes automated exposure control, adjustment of the mA and/or kV according to patient size and/or use of iterative reconstruction technique. CONTRAST:  75mL OMNIPAQUE  IOHEXOL  350 MG/ML SOLN COMPARISON:  Abdomen pelvis CT 12/17/2022. Chest abdomen and pelvis CT 10/14/2022. FINDINGS: CT CHEST FINDINGS Cardiovascular: Right upper chest port in place. Tip seen to the level of the SVC right atrial junction. Heart is nonenlarged. Coronary artery calcifications are seen. The thoracic aorta has some scattered mild atherosclerotic calcified plaque. There is some ectasia of the distal aortic arch of up to 3.5 cm in diameter. No dissection or aneurysm formation  otherwise no pericardial effusion. Mediastinum/Nodes: No enlarged mediastinal, hilar, or  axillary lymph nodes. Thyroid  gland, trachea, and esophagus demonstrate no significant findings. Previous anterior cardiophrenic angle lymph node today measures 6 mm in diameter, not significantly changed when adjusted for technique Lungs/Pleura: There is some linear opacity lung bases likely scar or atelectasis. No consolidation, pneumothorax or effusion. Trace right pleural effusion is decreased further from previous no dominant lung nodule. Musculoskeletal: Mild degenerative changes along the spine. Curvature. Prominent degenerative changes of the shoulders as well. CT ABDOMEN PELVIS FINDINGS Hepatobiliary: Stones in the nondilated gallbladder. No space-occupying liver lesion. Patent portal vein. Pancreas: Unremarkable. No pancreatic ductal dilatation or surrounding inflammatory changes. Spleen: Normal in size without focal abnormality. Adrenals/Urinary Tract: Adrenal glands are unremarkable. Kidneys are normal, without renal calculi, focal lesion, or hydronephrosis. Bladder is unremarkable. Stomach/Bowel: Oral contrast was administered. Large bowel has a normal course and caliber. Diffuse diverticula identified along the colon greatest along the descending and sigmoid region. Stomach and small bowel are nondilated. Vascular/Lymphatic: Normal caliber aorta and IVC. Scattered atherosclerotic calcifications identified along the aorta and branch vessels. Previous right pelvic sidewall, iliac node which measured 10 mm in short axis, today on series 3, image 99 measures 8 mm. No new abnormal lymph node enlargement identified in the abdomen and pelvis Reproductive: Uterus is no longer seen. Please correlate with the history. Rounded areas low-density soft tissue right adnexa on series 3, image 101 measures 2.7 by 2.1 cm. This could be postsurgical. Recommend close follow-up. Other: The nodular areas seen along the mesentery such as  along the left side near the margin of the descending colon have decreased. Few residual smaller areas such as series 3, image 70 anterior to the descending colon. No developing ascites Musculoskeletal: Curvature of the spine. Scattered degenerative changes of the spine and pelvis. New nodular area of wall thickening along the anterior abdominal wall left side series 3, image 80 measuring 16 mm IMPRESSION: Interval hysterectomy with some low-density nodular tissue in the right adnexa. Attention on close follow up. Decreasing mesenteric areas of nodular thickening with some residual along the left side in particular. No developing ascites. Small right pelvic sidewall lymph node is decreasing in size today compared to previous pre-surgical CT. No new lymph node enlargement identified in the chest, abdomen and pelvis. New nodular areas of thickening along the left side of the anterior abdominal wall. Possibilities include related to recent surgery. Attention on follow-up. Gallstones.  Colonic diverticula Electronically Signed   By: Ranell Bring M.D.   On: 04/09/2023 11:06        PHYSICAL EXAMINATION: ECOG PERFORMANCE STATUS: 1 - Symptomatic but completely ambulatory  Vitals:   05/08/23 1249  BP: 133/79  Pulse: (!) 53  Resp: 18  Temp: 98.9 F (37.2 C)  SpO2: 100%   Filed Weights   05/08/23 1249  Weight: 179 lb 9.6 oz (81.5 kg)    GENERAL:alert, no distress and comfortable NEURO: alert & oriented x 3 with fluent speech, no focal motor/sensory deficits  LABORATORY DATA:  I have reviewed the data as listed    Component Value Date/Time   NA 140 05/08/2023 1226   NA 144 05/13/2018 1500   K 3.4 (L) 05/08/2023 1226   CL 105 05/08/2023 1226   CO2 30 05/08/2023 1226   GLUCOSE 100 (H) 05/08/2023 1226   BUN 16 05/08/2023 1226   BUN 13 05/13/2018 1500   CREATININE 0.67 05/08/2023 1226   CREATININE 0.69 03/19/2023 0904   CALCIUM  9.7 05/08/2023 1226   PROT 7.2 05/08/2023 1226   ALBUMIN   4.3  05/08/2023 1226   AST 14 (L) 05/08/2023 1226   AST 14 (L) 03/19/2023 0904   ALT 9 05/08/2023 1226   ALT 11 03/19/2023 0904   ALKPHOS 53 05/08/2023 1226   BILITOT 0.4 05/08/2023 1226   BILITOT 0.4 03/19/2023 0904   GFRNONAA >60 05/08/2023 1226   GFRNONAA >60 03/19/2023 0904   GFRAA 110 05/13/2018 1500   GFRAA >60 07/21/2017 1131    No results found for: SPEP, UPEP  Lab Results  Component Value Date   WBC 3.8 (L) 05/08/2023   NEUTROABS 1.1 (L) 05/08/2023   HGB 10.0 (L) 05/08/2023   HCT 29.0 (L) 05/08/2023   MCV 101.0 (H) 05/08/2023   PLT 148 (L) 05/08/2023      Chemistry      Component Value Date/Time   NA 140 05/08/2023 1226   NA 144 05/13/2018 1500   K 3.4 (L) 05/08/2023 1226   CL 105 05/08/2023 1226   CO2 30 05/08/2023 1226   BUN 16 05/08/2023 1226   BUN 13 05/13/2018 1500   CREATININE 0.67 05/08/2023 1226   CREATININE 0.69 03/19/2023 0904      Component Value Date/Time   CALCIUM  9.7 05/08/2023 1226   ALKPHOS 53 05/08/2023 1226   AST 14 (L) 05/08/2023 1226   AST 14 (L) 03/19/2023 0904   ALT 9 05/08/2023 1226   ALT 11 03/19/2023 0904   BILITOT 0.4 05/08/2023 1226   BILITOT 0.4 03/19/2023 0904

## 2023-05-08 NOTE — Patient Instructions (Signed)

## 2023-05-08 NOTE — Telephone Encounter (Signed)
 CALLED PATIENT TO REMIND OF HDR TX. FOR 05-11-23 @ 10 AM, SPOKE WITH PATIENT AND SHE IS AWARE OF THIS TX.

## 2023-05-08 NOTE — Assessment & Plan Note (Signed)
 Due to recent treatment She is not symptomatic I anticipate full recovery in the future after time away from chemotherapy

## 2023-05-08 NOTE — Assessment & Plan Note (Signed)
 Her last imaging study showed excellent response to therapy There are no signs of residual disease She will continue maintenance trastuzumab  She will be due for echocardiogram in a few weeks I plan to repeat imaging study again in 4 months, due around March 2025

## 2023-05-09 NOTE — Progress Notes (Signed)
  Radiation Oncology         [phone]) [phone] ________________________________  Name: Margueritte E Geathers MRN: [id number]  Date: 05/11/2023  DOB: 19-Jun-1950  CC: Clarice Nottingham, MD  Viktoria Comer SAUNDERS, MD  HDR BRACHYTHERAPY NOTE  DIAGNOSIS: The encounter diagnosis was Endometrial cancer [facility]).   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 She will be treated with a 2.5 cm diameter segmented cylinder. This conforms to her anatomy without undue discomfort.  Vaginal brachytherapy procedure node: The patient was brought to the HDR suite. Identity was confirmed. All relevant records and images related to the planned course of therapy were reviewed. The patient freely provided informed written consent to proceed with treatment after reviewing the details related to the planned course of therapy. The consent form was witnessed and verified by the simulation staff. Then, the patient was set-up in a stable reproducible supine position for radiation therapy. Pelvic exam revealed the vaginal cuff to be intact . The patient's custom vaginal cylinder was placed in the proximal vagina. This was affixed to the CT/MR stabilization plate to prevent slippage. Patient tolerated the placement well.  Verification simulation note:  A fiducial marker was placed within the vaginal cylinder. An AP and lateral film was then obtained through the pelvis area. This documented accurate position of the vaginal cylinder for treatment.  HDR BRACHYTHERAPY TREATMENT  The remote afterloading device was affixed to the vaginal cylinder by catheter. Patient then proceeded to  undergo her fifth high-dose-rate treatment directed at the proximal vagina. The patient was prescribed a dose of 6.0 gray to be delivered to the mucosal surface. Treatment length was 3.0 cm. Patient was treated with 1 channel using 7 dwell positions. Treatment time was 139.2 seconds. Iridium 192 was the high-dose-rate source for treatment. The patient tolerated the treatment well. After completion of her therapy, a radiation survey was performed documenting return of the iridium source into the [employer] safe.   PLAN: The patient has completed her fifth and final high-dose-rate treatment. She tolerated brachytherapy relatively well overall without any significant side effects . She will return for a routine follow-up visit in one month.  ________________________________  -----------------------------------  Lynwood CHARM Nasuti, PhD, MD  This document serves as a record of services personally performed by Lynwood Nasuti, MD. It was created on his behalf by Dorthy Fuse, a trained medical scribe. The creation of this record is based on the scribe's personal observations and the provider's statements to them. This document has been checked and approved by the attending provider.

## 2023-05-11 ENCOUNTER — Other Ambulatory Visit: Payer: Self-pay

## 2023-05-11 ENCOUNTER — Ambulatory Visit
Admission: RE | Admit: 2023-05-11 | Discharge: 2023-05-11 | Disposition: A | Discharge status: Home / Self Care | Payer: Medicare Other | Source: Ambulatory Visit | Attending: Radiation Oncology | Admitting: Radiation Oncology

## 2023-05-11 DIAGNOSIS — C539 Malignant neoplasm of cervix uteri, unspecified: Secondary | ICD-10-CM | POA: Diagnosis not present

## 2023-05-11 DIAGNOSIS — C538 Malignant neoplasm of overlapping sites of cervix uteri: Secondary | ICD-10-CM

## 2023-05-11 LAB — RAD ONC ARIA SESSION SUMMARY
Course Elapsed Days: 21
Plan Fractions Treated to Date: 5
Plan Prescribed Dose Per Fraction: 6 Gy
Plan Total Fractions Prescribed: 5
Plan Total Prescribed Dose: 30 Gy
Reference Point Dosage Given to Date: 30 Gy
Reference Point Session Dosage Given: 6 Gy
Session Number: 5

## 2023-05-12 NOTE — Radiation Completion Notes (Addendum)
  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, peritoneal, and omental metastatic disease   Intent: Curative     ==========DELIVERED PLANS==========  First Treatment Date: 2023-04-20 Last Treatment Date: 2023-05-11   Plan Name: [id number] Site: Vagina Technique: HDR Ir-192 Mode: Brachytherapy Dose Per Fraction: 6 Gy Prescribed Dose (Delivered / Prescribed): 30 Gy / 30 Gy Prescribed Fxs (Delivered / Prescribed): 5 / 5     ==========ON TREATMENT VISIT DATES========== 2023-04-20, 2023-04-23, 2023-05-04, 2023-05-07, 2023-05-11   See weekly On Treatment Notes in Epic for details in the Media tab (listed as Progress notes on the On Treatment Visit Dates listed above). The patient tolerated radiation.    The patient will be seen in one month in the radiation oncology department. She will continue follow up with Dr. Viktoria and Dr. Lonn as well.      Donald KYM Husband, PAC

## 2023-05-27 ENCOUNTER — Ambulatory Visit (HOSPITAL_COMMUNITY)
Admission: RE | Admit: 2023-05-27 | Discharge: 2023-05-27 | Discharge status: Home / Self Care | Payer: Medicare Other | Source: Ambulatory Visit | Attending: Hematology and Oncology

## 2023-05-27 DIAGNOSIS — Z8673 Personal history of transient ischemic attack (TIA), and cerebral infarction without residual deficits: Secondary | ICD-10-CM | POA: Insufficient documentation

## 2023-05-27 DIAGNOSIS — C539 Malignant neoplasm of cervix uteri, unspecified: Secondary | ICD-10-CM | POA: Insufficient documentation

## 2023-05-27 DIAGNOSIS — I10 Essential (primary) hypertension: Secondary | ICD-10-CM | POA: Insufficient documentation

## 2023-05-27 DIAGNOSIS — E119 Type 2 diabetes mellitus without complications: Secondary | ICD-10-CM | POA: Insufficient documentation

## 2023-05-27 DIAGNOSIS — R9431 Abnormal electrocardiogram [ECG] [EKG]: Secondary | ICD-10-CM | POA: Diagnosis not present

## 2023-05-27 DIAGNOSIS — F1090 Alcohol use, unspecified, uncomplicated: Secondary | ICD-10-CM | POA: Insufficient documentation

## 2023-05-27 DIAGNOSIS — Z0189 Encounter for other specified special examinations: Secondary | ICD-10-CM | POA: Diagnosis not present

## 2023-05-27 DIAGNOSIS — R000 Tachycardia, unspecified: Secondary | ICD-10-CM | POA: Insufficient documentation

## 2023-05-27 DIAGNOSIS — Z79899 Other long term (current) drug therapy: Secondary | ICD-10-CM | POA: Insufficient documentation

## 2023-05-27 DIAGNOSIS — I959 Hypotension, unspecified: Secondary | ICD-10-CM | POA: Diagnosis not present

## 2023-05-27 LAB — ECHOCARDIOGRAM COMPLETE
Area-P 1/2: 3.27 cm2
Calc EF: 65.9 %
S' Lateral: 2.4 cm
Single Plane A2C EF: 65.8 %
Single Plane A4C EF: 64.1 %

## 2023-05-27 NOTE — Progress Notes (Signed)
  Echocardiogram 2D Echocardiogram has been performed.  Maria Avila 05/27/2023, 10:50 AM

## 2023-05-29 ENCOUNTER — Inpatient Hospital Stay: Payer: Medicare Other

## 2023-05-29 ENCOUNTER — Encounter: Payer: Self-pay | Admitting: Hematology and Oncology

## 2023-05-29 ENCOUNTER — Inpatient Hospital Stay: Payer: Medicare Other | Admitting: Hematology and Oncology

## 2023-05-29 VITALS — BP 135/69 | HR 59 | Temp 98.9°F | Resp 18 | Ht 62.0 in | Wt 179.4 lb

## 2023-05-29 VITALS — BP 118/72 | HR 59 | Resp 16

## 2023-05-29 DIAGNOSIS — Z5112 Encounter for antineoplastic immunotherapy: Secondary | ICD-10-CM | POA: Diagnosis not present

## 2023-05-29 DIAGNOSIS — Z79620 Long term (current) use of immunosuppressive biologic: Secondary | ICD-10-CM | POA: Diagnosis not present

## 2023-05-29 DIAGNOSIS — C538 Malignant neoplasm of overlapping sites of cervix uteri: Secondary | ICD-10-CM | POA: Diagnosis not present

## 2023-05-29 DIAGNOSIS — D61818 Other pancytopenia: Secondary | ICD-10-CM

## 2023-05-29 DIAGNOSIS — C55 Malignant neoplasm of uterus, part unspecified: Secondary | ICD-10-CM

## 2023-05-29 DIAGNOSIS — C775 Secondary and unspecified malignant neoplasm of intrapelvic lymph nodes: Secondary | ICD-10-CM | POA: Diagnosis not present

## 2023-05-29 LAB — CBC WITH DIFFERENTIAL/PLATELET
Abs Immature Granulocytes: 0.01 10*3/uL (ref 0.00–0.07)
Basophils Absolute: 0 10*3/uL (ref 0.0–0.1)
Basophils Relative: 0 %
Eosinophils Absolute: 0.1 10*3/uL (ref 0.0–0.5)
Eosinophils Relative: 3 %
HCT: 30.3 % — ABNORMAL LOW (ref 36.0–46.0)
Hemoglobin: 10.4 g/dL — ABNORMAL LOW (ref 12.0–15.0)
Immature Granulocytes: 0 %
Lymphocytes Relative: 61 %
Lymphs Abs: 2 10*3/uL (ref 0.7–4.0)
MCH: 35.1 pg — ABNORMAL HIGH (ref 26.0–34.0)
MCHC: 34.3 g/dL (ref 30.0–36.0)
MCV: 102.4 fL — ABNORMAL HIGH (ref 80.0–100.0)
Monocytes Absolute: 0.2 10*3/uL (ref 0.1–1.0)
Monocytes Relative: 7 %
Neutro Abs: 0.9 10*3/uL — ABNORMAL LOW (ref 1.7–7.7)
Neutrophils Relative %: 29 %
Platelets: 132 10*3/uL — ABNORMAL LOW (ref 150–400)
RBC: 2.96 MIL/uL — ABNORMAL LOW (ref 3.87–5.11)
RDW: 13.7 % (ref 11.5–15.5)
WBC: 3.3 10*3/uL — ABNORMAL LOW (ref 4.0–10.5)
nRBC: 0 % (ref 0.0–0.2)

## 2023-05-29 LAB — COMPREHENSIVE METABOLIC PANEL
ALT: 11 U/L (ref 0–44)
AST: 15 U/L (ref 15–41)
Albumin: 4.3 g/dL (ref 3.5–5.0)
Alkaline Phosphatase: 48 U/L (ref 38–126)
Anion gap: 7 (ref 5–15)
BUN: 17 mg/dL (ref 8–23)
CO2: 28 mmol/L (ref 22–32)
Calcium: 10.2 mg/dL (ref 8.9–10.3)
Chloride: 105 mmol/L (ref 98–111)
Creatinine, Ser: 0.76 mg/dL (ref 0.44–1.00)
GFR, Estimated: >60 mL/min (ref >60)
Glucose, Bld: 91 mg/dL (ref 70–99)
Potassium: 3.4 mmol/L — ABNORMAL LOW (ref 3.5–5.1)
Sodium: 140 mmol/L (ref 135–145)
Total Bilirubin: 0.5 mg/dL (ref 0.0–1.2)
Total Protein: 7.1 g/dL (ref 6.5–8.1)

## 2023-05-29 MED ORDER — SODIUM CHLORIDE 0.9% FLUSH
10.0000 mL | INTRAVENOUS | Status: DC | PRN
Start: 2023-05-29 — End: 2023-05-29
  Administered 2023-05-29: 10 mL

## 2023-05-29 MED ORDER — SODIUM CHLORIDE 0.9 % IV SOLN: Freq: Once | INTRAVENOUS | Status: AC

## 2023-05-29 MED ORDER — ACETAMINOPHEN 325 MG PO TABS
650.0000 mg | ORAL_TABLET | Freq: Once | ORAL | Status: AC
  Administered 2023-05-29: 650 mg via ORAL
  Filled 2023-05-29: qty 2

## 2023-05-29 MED ORDER — HEPARIN SOD (PORK) LOCK FLUSH 100 UNIT/ML IV SOLN
500.0000 [IU] | Freq: Once | INTRAVENOUS | Status: AC | PRN
  Administered 2023-05-29: 500 [IU]

## 2023-05-29 MED ORDER — TRASTUZUMAB-ANNS CHEMO 150 MG IV SOLR
6.0000 mg/kg | Freq: Once | INTRAVENOUS | Status: AC
  Administered 2023-05-29: 483 mg via INTRAVENOUS
  Filled 2023-05-29: qty 23

## 2023-05-29 MED ORDER — SODIUM CHLORIDE 0.9% FLUSH
10.0000 mL | Freq: Once | INTRAVENOUS | Status: AC
  Administered 2023-05-29: 10 mL

## 2023-05-29 NOTE — Patient Instructions (Signed)
[facility] - A DEPT OF [facility]  Discharge Instructions: Thank you for choosing [facility] to provide your oncology and hematology care.   If you have a lab appointment with the Cancer Center, please go directly to the Cancer Center and check in at the registration area.   Wear comfortable clothing and clothing appropriate for easy access to any Portacath or PICC line.   We strive to give you quality time with your provider. You may need to reschedule your appointment if you arrive late (15 or more minutes).  Arriving late affects you and other patients whose appointments are after yours.  Also, if you miss three or more appointments without notifying the office, you may be dismissed from the clinic at the provider's discretion.      For prescription refill requests, have your pharmacy contact our office and allow 72 hours for refills to be completed.    Today you received the following chemotherapy and/or immunotherapy agents: Kanjinti      To help prevent nausea and vomiting after your treatment, we encourage you to take your nausea medication as directed.  BELOW ARE SYMPTOMS THAT SHOULD BE REPORTED IMMEDIATELY: *FEVER GREATER THAN 100.4 F (38 C) OR HIGHER *CHILLS OR SWEATING *NAUSEA AND VOMITING THAT IS NOT CONTROLLED WITH YOUR NAUSEA MEDICATION *UNUSUAL SHORTNESS OF BREATH *UNUSUAL BRUISING OR BLEEDING *URINARY PROBLEMS (pain or burning when urinating, or frequent urination) *BOWEL PROBLEMS (unusual diarrhea, constipation, pain near the anus) TENDERNESS IN MOUTH AND THROAT WITH OR WITHOUT PRESENCE OF ULCERS (sore throat, sores in mouth, or a toothache) UNUSUAL RASH, SWELLING OR PAIN  UNUSUAL VAGINAL DISCHARGE OR ITCHING   Items with * indicate a potential emergency and should be followed up as soon as possible or go to the Emergency Department if any problems should occur.  Please show the CHEMOTHERAPY ALERT CARD or IMMUNOTHERAPY  ALERT CARD at check-in to the Emergency Department and triage nurse.  Should you have questions after your visit or need to cancel or reschedule your appointment, please contact [facility] - A DEPT OF [facility]  Dept: [phone]  and follow the prompts.  Office hours are 8:00 a.m. to 4:30 p.m. Monday - Friday. Please note that voicemails left after 4:00 p.m. may not be returned until the following business day.  We are closed weekends and major holidays. You have access to a nurse at all times for urgent questions. Please call the main number to the clinic Dept: [phone] and follow the prompts.   For any non-urgent questions, you may also contact your provider using MyChart. We now offer e-Visits for anyone 46 and older to request care online for non-urgent symptoms. For details visit [email or web address].   Also download the MyChart app! Go to the app store, search "MyChart", open the app, select [place], and log in with your MyChart username and password.

## 2023-05-29 NOTE — Assessment & Plan Note (Signed)
Due to recent treatment She is not symptomatic I anticipate full recovery in the future after time away from chemotherapy Her recent vitamin B12 level was adequate I recommend the patient to start graduated exercise as tolerated Exercise can improve her blood counts

## 2023-05-29 NOTE — Assessment & Plan Note (Signed)
Her last imaging study showed excellent response to therapy There are no signs of residual disease She will continue maintenance trastuzumab Echocardiogram from Jan 2025 showed preserved ejection fraction  I plan to repeat imaging study again in a few months, due around March 2025

## 2023-05-29 NOTE — Progress Notes (Signed)
[facility] OFFICE PROGRESS NOTE  Patient Care Team: Merri Brunette, MD as PCP - General (Internal Medicine) Paulina Fusi Servando Snare, RN as Oncology Nurse Navigator (Oncology)  ASSESSMENT & PLAN:  Uterine cancer Maria Avila) Her last imaging study showed excellent response to therapy There are no signs of residual disease She will continue maintenance trastuzumab Echocardiogram from Jan 2025 showed preserved ejection fraction  I plan to repeat imaging study again in a few months, due around March 2025  Pancytopenia, acquired Maria [facility]ila) Due to recent treatment She is not symptomatic I anticipate full recovery in the future after time away from chemotherapy Her recent vitamin B12 level was adequate I recommend the patient to start graduated exercise as tolerated Exercise can improve her blood counts  No orders of the defined types were placed in this encounter.   All questions were answered. The patient knows to call the clinic with any problems, questions or concerns. The total time spent in the appointment was 30 minutes encounter with patients including review of chart and various tests results, discussions about plan of care and coordination of care plan   Artis Delay, MD 05/29/2023 10:11 AM  INTERVAL HISTORY: Please see below for problem oriented charting. she returns for chemo follow-up with her husband Since last time I saw her, she has been doing well She denies recent infection or bleeding Her energy level is fair although she is quite sedentary We discussed recent echocardiogram results We reviewed her CBC result and plan of care  REVIEW OF SYSTEMS:   Constitutional: Denies fevers, chills or abnormal weight loss Eyes: Denies blurriness of vision Ears, nose, mouth, throat, and face: Denies mucositis or sore throat Respiratory: Denies cough, dyspnea or wheezes Cardiovascular: Denies palpitation, chest discomfort or lower extremity swelling Gastrointestinal:  Denies nausea,  heartburn or change in bowel habits Skin: Denies abnormal skin rashes Lymphatics: Denies new lymphadenopathy or easy bruising Neurological:Denies numbness, tingling or new weaknesses Behavioral/Psych: Mood is stable, no new changes  All other systems were reviewed with the patient and are negative.  I have reviewed the past medical history, past surgical history, social history and family history with the patient and they are unchanged from previous note.  ALLERGIES:  is allergic to crestor [rosuvastatin calcium] and zocor [simvastatin].  MEDICATIONS:  Current Outpatient Medications  Medication Sig Dispense Refill   acetaminophen (TYLENOL) 325 MG tablet Take 1-2 tablets (325-650 mg total) by mouth every 4 (four) hours as needed for mild pain.     benazepril-hydrochlorthiazide (LOTENSIN HCT) 20-25 MG tablet Take 1 tablet by mouth daily.     [employer] VITAMIN C 500 MG tablet TAKE 1 TABLET BY MOUTH TWICE A DAY 100 tablet 0   ezetimibe (ZETIA) 10 MG tablet Take 10 mg by mouth daily.     lidocaine-prilocaine (EMLA) cream Apply to affected area once (Patient taking differently: Apply 1 Application topically daily as needed (prior to port access).) 30 g 3   Multiple Vitamin (MULTIVITAMIN) capsule Take 1 capsule by mouth daily.     ondansetron (ZOFRAN) 8 MG tablet Take 1 tablet (8 mg total) by mouth every 8 (eight) hours as needed for nausea. 30 tablet 3   prochlorperazine (COMPAZINE) 10 MG tablet Take 1 tablet (10 mg total) by mouth every 6 (six) hours as needed for nausea or vomiting. 30 tablet 1   thiamine (VITAMIN B-1) 100 MG tablet TAKE 1 TABLET BY MOUTH EVERY DAY 30 tablet 0   No current facility-administered medications for this visit.  Facility-Administered Medications Ordered in Other Visits  Medication Dose Route Frequency Provider Last Rate Last Admin   heparin lock flush 100 unit/mL  500 Units Intracatheter Once PRN Bertis Ruddy, Huyen Perazzo, MD       sodium chloride flush (NS) 0.9 % injection 10 mL   10 mL Intracatheter PRN Bertis Ruddy, Page Lancon, MD       trastuzumab-anns (KANJINTI) 483 mg in sodium chloride 0.9 % 250 mL chemo infusion  6 mg/kg (Treatment Plan Recorded) Intravenous Once Artis Delay, MD        SUMMARY OF ONCOLOGIC HISTORY: Oncology History Overview Note  MMR normal, Her2 positive, FISh 3+, MSI stable High grade serous PIK3CA pathogenic variant, p53 mutated   Uterine cancer (HCC)  08/12/2022 Initial Diagnosis   She presented with PMB   09/24/2022 Pathology Results   Diagnosis 1. Endometrium, curettage - HIGH GRADE SEROUS CARCINOMA (SEE NOTE) 2. Endometrium, resection - HIGH GRADE SEROUS CARCINOMA (SEE NOTE) Diagnosis Note 1. - immunohistochemical stain reveal tumor cells are positive for p16. P53 shows strong expression consistent with mutant type. This case was reviewed with Dr. Reynolds Bowl who agrees with the diagnosis   10/14/2022 Imaging   1. Ill-defined fullness of the uterus and endometrial cavity without discretely visualized mass, in keeping with patient's known endometrial malignancy. 2. Enlarged right iliac and pelvic sidewall lymph nodes, as well as enlarged epicardial lymph nodes or soft tissue nodules. 3. Scattered peritoneal and omental soft tissue nodularity, particularly notable in the left upper quadrant. 4. Soft tissue matting about the distal sigmoid colon and rectum in the low pelvis. 5. Constellation of findings is consistent with nodal, peritoneal, and omental metastatic disease. 6. Small right pleural effusion, nonspecific although modestly suspicious for a malignant effusion, however without directly visualized pleural mass or nodularity. 7. Pancolonic diverticulosis, severe in the descending and sigmoid colon. 8. Cholelithiasis. 9. Coronary artery disease.   10/17/2022 Initial Diagnosis   Uterine cancer (HCC)   10/17/2022 Cancer Staging   Staging form: Corpus Uteri - Carcinoma and Carcinosarcoma, AJCC 8th Edition - Clinical stage from 10/17/2022: FIGO Stage  IVB (cT1b, cN1, cM1) - Signed by Artis Delay, MD on 10/17/2022 Stage prefix: Initial diagnosis   10/23/2022 Echocardiogram   1. Left ventricular ejection fraction, by estimation, is 65 to 70%. Left ventricular ejection fraction by 2D MOD biplane is 68.1 %. The left ventricle has normal function. The left ventricle has no regional wall motion abnormalities. There is mild left ventricular hypertrophy. Left ventricular diastolic parameters are consistent with Grade I diastolic dysfunction (impaired relaxation).  2. Right ventricular systolic function is normal. The right ventricular size is normal. There is normal pulmonary artery systolic pressure. The estimated right ventricular systolic pressure is 27.6 mmHg.  3. The mitral valve is abnormal. Trivial mitral valve regurgitation.  4. The aortic valve is tricuspid. Aortic valve regurgitation is not visualized. Aortic valve sclerosis is present, with no evidence of aortic valve stenosis.  5. The inferior vena cava is normal in size with greater than 50% respiratory variability, suggesting right atrial pressure of 3 mmHg.   10/28/2022 -  Chemotherapy   Patient is on Treatment Plan : UTERINE SEROUS CARCINOMA Carboplatin + Paclitaxel + Trastuzumab q21d x 6 Cycles / Trastuzumab q21d     12/23/2022 Imaging   CT ABDOMEN PELVIS W CONTRAST  Result Date: 12/23/2022 CLINICAL DATA:  Follow-up endometrial cancer, status post chemotherapy EXAM: CT ABDOMEN AND PELVIS WITH CONTRAST TECHNIQUE: Multidetector CT imaging of the abdomen and pelvis was performed using the standard protocol  following bolus administration of intravenous contrast. RADIATION DOSE REDUCTION: This exam was performed according to the departmental dose-optimization program which includes automated exposure control, adjustment of the mA and/or kV according to patient size and/or use of iterative reconstruction technique. CONTRAST:  75mL OMNIPAQUE IOHEXOL 350 MG/ML SOLN COMPARISON:  10/14/2022 FINDINGS:  Lower chest: Small right pleural effusion. Associated bilateral lower lobe atelectasis. Hepatobiliary: Liver is within normal limits. Layering small gallstones (series 3/image 27), without associated inflammatory changes. No intrahepatic or extrahepatic duct dilatation. Pancreas: Within normal limits. Spleen: Within normal limits. Adrenals/Urinary Tract: Adrenal glands are within normal limits. Kidneys are within normal limits.  No hydronephrosis. Bladder is within normal limits. Stomach/Bowel: Stomach is within normal limits. No evidence of bowel obstruction. Normal appendix (series 3/image 46). Left colonic diverticulosis, without evidence of diverticulitis. Vascular/Lymphatic: No evidence of abdominal aortic aneurysm. Atherosclerotic calcifications of the abdominal aorta and branch vessels, although vessels remain patent. Small right iliac nodes, including a dominant 10 mm short axis right obturator node (series 3/image 56), previously 12 mm. Reproductive: Mildly heterogeneous uterus with endometrial thickening measuring up to 16 mm (series 7/image 33), corresponding to the patient's known endometrial cancer. No adnexal masses. Other: No abdominopelvic ascites. Mild peritoneal disease/omental caking beneath the left mid abdominal wall (series 3/image 45), mildly improved. Minimal perirectal soft tissue in the left lower pelvis (series 3/image 58), improved. Musculoskeletal: Degenerative changes of the visualized thoracolumbar spine. IMPRESSION: Mildly heterogeneous uterus with endometrial thickening, corresponding to the patient's known endometrial cancer. Small right pelvic nodal metastases, improved. Mild peritoneal disease/omental caking, improved Small right pleural effusion. Electronically Signed   By: Charline Bills M.D.   On: 12/23/2022 02:18      01/07/2023 Surgery   Robotic-assisted laparoscopic total hysterectomy with bilateral salpingo-oophorectomy, lysis of adhesions, tumor debulking including  peritoneal stripping, omentectomy, mini laparotomy    01/07/2023 Pathology Results   CASE: [id number] PATIENT: Maria Avila Surgical Pathology Report  Reason for Addendum #1:  DNA Mismatch Repair IHC Results  Clinical History: advanced uterine cancer     FINAL MICROSCOPIC DIAGNOSIS:  A. UTERUS, CERVIX, BILATERAL FALLOPIAN TUBES AND OVARIES, RESECTION: - High-grade serous carcinoma, involving the entire endometrial surface, see comment - Carcinoma invades for a depth of 1.6 cm myometrial thickness is 1.7 cm (> 90%) - Cervical stroma is involved by carcinoma - No evidence of lymphovascular invasion - Carcinoma involves surface of bilateral ovaries and left fallopian tube - Benign leiomyomata - See oncology table  B. POSTERIOR CUL DE SAC, EXCISION: - High-grade serous carcinoma with psammomatous calcifications  C. LEFT PELVIC SIDEWALL, EXCISION: - High-grade serous carcinoma with psammomatous calcifications  D. OMENTUM, EXCISION: - High-grade serous carcinoma with psammomatous calcifications  COMMENT:  Immunohistochemical stains for p53 and p16 are diffusely positive in the tumor cells (clonal overexpression pattern), consistent with above diagnosis.  Dr. Kenyon Ana reviewed the case and concurs with the above diagnosis.  ONCOLOGY TABLE:  UTERUS, CARCINOMA OR CARCINOSARCOMA: Resection  Procedure: Total hysterectomy and bilateral salpingo-oophorectomy Histologic Type: Serous carcinoma Histologic Grade: High-grade Myometrial Invasion:      Depth of Myometrial Invasion (mm): 16 mm      Myometrial Thickness (mm): 17 mm      Percentage of Myometrial Invasion: >90% Uterine Serosa Involvement: Not identified Cervical stromal Involvement: Present Extent of involvement of other tissue/organs: Bilateral ovaries, left fallopian tube and pelvic sidewall Peritoneal/Ascitic Fluid: Not applicable Lymphovascular Invasion: Not identified Regional Lymph Nodes: Not applicable (no  lymph nodes submitted or found)  Distant Metastasis:  Distant Site(s) Involved: Omentum Pathologic Stage Classification (pTNM, AJCC 8th Edition): pT3a, pN not assigned Ancillary Studies: MMR / MSI testing will be ordered    01/07/2023 Surgery   OPERATIVE NOTE  Pre-operative Diagnosis: 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  On intra-abdominal entry, normal upper abdominal survey.  Omentum adherent to the sigmoid epiploica and the descending colon.  5 mm tumor implant along the left pelvic brim.  Normal-appearing bilateral tubes and ovaries although the sigmoid colon and epiploica adherent to the medial aspect of the left adnexa and posterior uterus.  Miliary disease noted over the anterior aspect of the sigmoid colon, rectum, and posterior cul-de-sac peritoneum.  Some obliteration of the deep posterior cul-de-sac.  On rectal exam, approximately 7-8 cm above the anal verge, just superior to the cervicovaginal junction, there is some tethering of the rectum.  Some adhesions between the bladder and lower uterine segment.  Minimal pelvic ascites.  Medical comorbidities as well as what would have required a very low rectal resection and anastomosis to achieve complete cytoreduction with the need for a diverting ileostomy, decision made to remove the bulk of disease without bowel surgery.  Much of the pelvic peritoneum with miliary disease stripped within the cul-de-sac.   02/23/2023 Echocardiogram     1. Left ventricular ejection fraction, by estimation, is 60 to 65%. Left ventricular ejection fraction by 3D volume is  61 %. The left ventricle has normal function. The left ventricle has no regional wall motion abnormalities. Left ventricular diastolic  parameters were normal. The average left ventricular global longitudinal strain is -20.9 %. The global longitudinal strain is normal.  2. Right ventricular systolic function is normal. The right ventricular size is normal. There is normal pulmonary artery systolic pressure. The estimated right ventricular systolic pressure is 25.7 mmHg.  3. The mitral valve is grossly normal. Trivial mitral valve regurgitation. No evidence of mitral stenosis.  4. The aortic valve is tricuspid. Aortic valve regurgitation is not visualized. No aortic stenosis is present.  5. The inferior vena cava is normal in size with greater than 50% respiratory variability, suggesting right atrial pressure of 3 mmHg.     04/03/2023 Imaging   CT CHEST ABDOMEN PELVIS W CONTRAST  Result Date: 04/09/2023 CLINICAL DATA:  Endometrial cancer monitoring. Assess response to chemotherapy. * Tracking Code: BO * EXAM: CT CHEST, ABDOMEN, AND PELVIS WITH CONTRAST TECHNIQUE: Multidetector CT imaging of the chest, abdomen and pelvis was performed following the standard protocol during bolus administration of intravenous contrast. RADIATION DOSE REDUCTION: This exam was performed according to the departmental dose-optimization program which includes automated exposure control, adjustment of the mA and/or kV according to patient size and/or use of iterative reconstruction technique. CONTRAST:  75mL OMNIPAQUE IOHEXOL 350 MG/ML SOLN COMPARISON:  Abdomen pelvis CT 12/17/2022. Chest abdomen and pelvis CT 10/14/2022. FINDINGS: CT CHEST FINDINGS Cardiovascular: Right upper chest port in place. Tip seen to the level of the SVC right atrial junction. Heart is nonenlarged. Coronary artery calcifications are seen. The thoracic aorta has some scattered mild atherosclerotic calcified plaque. There is some ectasia of the distal  aortic arch of up to 3.5 cm in diameter. No dissection or aneurysm  formation otherwise no pericardial effusion. Mediastinum/Nodes: No enlarged mediastinal, hilar, or axillary lymph nodes. Thyroid gland, trachea, and esophagus demonstrate no significant findings. Previous anterior cardiophrenic angle lymph node today measures 6 mm in diameter, not significantly changed when adjusted for technique Lungs/Pleura: There is some linear opacity lung bases likely scar or atelectasis. No consolidation, pneumothorax or effusion. Trace right pleural effusion is decreased further from previous no dominant lung nodule. Musculoskeletal: Mild degenerative changes along the spine. Curvature. Prominent degenerative changes of the shoulders as well. CT ABDOMEN PELVIS FINDINGS Hepatobiliary: Stones in the nondilated gallbladder. No space-occupying liver lesion. Patent portal vein. Pancreas: Unremarkable. No pancreatic ductal dilatation or surrounding inflammatory changes. Spleen: Normal in size without focal abnormality. Adrenals/Urinary Tract: Adrenal glands are unremarkable. Kidneys are normal, without renal calculi, focal lesion, or hydronephrosis. Bladder is unremarkable. Stomach/Bowel: Oral contrast was administered. Large bowel has a normal course and caliber. Diffuse diverticula identified along the colon greatest along the descending and sigmoid region. Stomach and small bowel are nondilated. Vascular/Lymphatic: Normal caliber aorta and IVC. Scattered atherosclerotic calcifications identified along the aorta and branch vessels. Previous right pelvic sidewall, iliac node which measured 10 mm in short axis, today on series 3, image 99 measures 8 mm. No new abnormal lymph node enlargement identified in the abdomen and pelvis Reproductive: Uterus is no longer seen. Please correlate with the history. Rounded areas low-density soft tissue right adnexa on series 3, image 101 measures 2.7 by 2.1 cm. This could be postsurgical.  Recommend close follow-up. Other: The nodular areas seen along the mesentery such as along the left side near the margin of the descending colon have decreased. Few residual smaller areas such as series 3, image 70 anterior to the descending colon. No developing ascites Musculoskeletal: Curvature of the spine. Scattered degenerative changes of the spine and pelvis. New nodular area of wall thickening along the anterior abdominal wall left side series 3, image 80 measuring 16 mm IMPRESSION: Interval hysterectomy with some low-density nodular tissue in the right adnexa. Attention on close follow up. Decreasing mesenteric areas of nodular thickening with some residual along the left side in particular. No developing ascites. Small right pelvic sidewall lymph node is decreasing in size today compared to previous pre-surgical CT. No new lymph node enlargement identified in the chest, abdomen and pelvis. New nodular areas of thickening along the left side of the anterior abdominal wall. Possibilities include related to recent surgery. Attention on follow-up. Gallstones.  Colonic diverticula Electronically Signed   By: Karen Kays M.D.   On: 04/09/2023 11:06      05/27/2023 Echocardiogram       1. Left ventricular ejection fraction, by estimation, is 65 to 70%. The left ventricle has normal function. The left ventricle has no regional wall motion abnormalities. There is mild left ventricular hypertrophy. Left ventricular diastolic parameters  are consistent with Grade I diastolic dysfunction (impaired relaxation). The average left ventricular global longitudinal strain is -22.5 %. The global longitudinal strain is normal.  2. Right ventricular systolic function is normal. The right ventricular size is normal. There is normal pulmonary artery systolic pressure.  3. Left atrial size was moderately dilated.  4. Right atrial size was mildly dilated.  5. The mitral valve is degenerative. Trivial mitral valve  regurgitation. No evidence of mitral stenosis.  6. The aortic valve is normal in structure. There is mild calcification of the aortic valve. Aortic valve regurgitation is not visualized. No aortic stenosis is present.  7. The inferior vena cava  is dilated in size with <50% respiratory variability, suggesting right atrial pressure of 15 mmHg.     PHYSICAL EXAMINATION: ECOG PERFORMANCE STATUS: 1 - Symptomatic but completely ambulatory  Vitals:   05/29/23 0839  BP: 135/69  Pulse: (!) 59  Resp: 18  Temp: 98.9 F (37.2 C)  SpO2: 98%   Filed Weights   05/29/23 0839  Weight: 179 lb 6.4 oz (81.4 kg)    GENERAL:alert, no distress and comfortable   LABORATORY DATA:  I have reviewed the data as listed    Component Value Date/Time   NA 140 05/29/2023 0816   NA 144 05/13/2018 1500   K 3.4 (L) 05/29/2023 0816   CL 105 05/29/2023 0816   CO2 28 05/29/2023 0816   GLUCOSE 91 05/29/2023 0816   BUN 17 05/29/2023 0816   BUN 13 05/13/2018 1500   CREATININE 0.76 05/29/2023 0816   CREATININE 0.69 03/19/2023 0904   CALCIUM 10.2 05/29/2023 0816   PROT 7.1 05/29/2023 0816   ALBUMIN 4.3 05/29/2023 0816   AST 15 05/29/2023 0816   AST 14 (L) 03/19/2023 0904   ALT 11 05/29/2023 0816   ALT 11 03/19/2023 0904   ALKPHOS 48 05/29/2023 0816   BILITOT 0.5 05/29/2023 0816   BILITOT 0.4 03/19/2023 0904   GFRNONAA >60 05/29/2023 0816   GFRNONAA >60 03/19/2023 0904   GFRAA 110 05/13/2018 1500   GFRAA >60 07/21/2017 1131    No results found for: "SPEP", "UPEP"  Lab Results  Component Value Date   WBC 3.3 (L) 05/29/2023   NEUTROABS 0.9 (L) 05/29/2023   HGB 10.4 (L) 05/29/2023   HCT 30.3 (L) 05/29/2023   MCV 102.4 (H) 05/29/2023   PLT 132 (L) 05/29/2023      Chemistry      Component Value Date/Time   NA 140 05/29/2023 0816   NA 144 05/13/2018 1500   K 3.4 (L) 05/29/2023 0816   CL 105 05/29/2023 0816   CO2 28 05/29/2023 0816   BUN 17 05/29/2023 0816   BUN 13 05/13/2018 1500    CREATININE 0.76 05/29/2023 0816   CREATININE 0.69 03/19/2023 0904      Component Value Date/Time   CALCIUM 10.2 05/29/2023 0816   ALKPHOS 48 05/29/2023 0816   AST 15 05/29/2023 0816   AST 14 (L) 03/19/2023 0904   ALT 11 05/29/2023 0816   ALT 11 03/19/2023 0904   BILITOT 0.5 05/29/2023 0816   BILITOT 0.4 03/19/2023 0904       RADIOGRAPHIC STUDIES: I have personally reviewed the radiological images as listed and agreed with the findings in the report. ECHOCARDIOGRAM COMPLETE Result Date: 05/27/2023    ECHOCARDIOGRAM REPORT   Patient Name:   Maria Avila Date of Exam: 05/27/2023 Medical Rec #:  [id number]     Height:       62.0 in Accession #:    [id number]    Weight:       179.6 lb Date of Birth:  07-Jan-1951     BSA:          1.826 m Patient Age:    72 years      BP:           123/79 mmHg Patient Gender: F             HR:           60 bpm. Exam Location:  Outpatient Procedure: 2D Echo, 3D Echo, Cardiac Doppler, Color Doppler and Strain Analysis Indications:  Z51.11 Encounter for antineoplastic chemotheraphy  History:        Patient has prior history of Echocardiogram examinations, most                 recent 02/20/2023. Abnormal ECG, Stroke, Arrythmias:Tachycardia,                 Signs/Symptoms:Hypotension; Risk Factors:Hypertension and                 Diabetes. Cancer. ETOH.  Sonographer:    Sheralyn Boatman RDCS Referring Phys: [id number] Adriel Kessen [facility]  Sonographer Comments: Image acquisition challenging due to patient body habitus. IMPRESSIONS  1. Left ventricular ejection fraction, by estimation, is 65 to 70%. The left ventricle has normal function. The left ventricle has no regional wall motion abnormalities. There is mild left ventricular hypertrophy. Left ventricular diastolic parameters are consistent with Grade I diastolic dysfunction (impaired relaxation). The average left ventricular global longitudinal strain is -22.5 %. The global longitudinal strain is normal.  2. Right ventricular systolic  function is normal. The right ventricular size is normal. There is normal pulmonary artery systolic pressure.  3. Left atrial size was moderately dilated.  4. Right atrial size was mildly dilated.  5. The mitral valve is degenerative. Trivial mitral valve regurgitation. No evidence of mitral stenosis.  6. The aortic valve is normal in structure. There is mild calcification of the aortic valve. Aortic valve regurgitation is not visualized. No aortic stenosis is present.  7. The inferior vena cava is dilated in size with <50% respiratory variability, suggesting right atrial pressure of 15 mmHg. FINDINGS  Left Ventricle: Left ventricular ejection fraction, by estimation, is 65 to 70%. The left ventricle has normal function. The left ventricle has no regional wall motion abnormalities. The average left ventricular global longitudinal strain is -22.5 %. The global longitudinal strain is normal. The left ventricular internal cavity size was normal in size. There is mild left ventricular hypertrophy. Left ventricular diastolic parameters are consistent with Grade I diastolic dysfunction (impaired relaxation). Right Ventricle: The right ventricular size is normal. No increase in right ventricular wall thickness. Right ventricular systolic function is normal. There is normal pulmonary artery systolic pressure. The tricuspid regurgitant velocity is 2.43 m/s, and  with an assumed right atrial pressure of 8 mmHg, the estimated right ventricular systolic pressure is 31.6 mmHg. Left Atrium: Left atrial size was moderately dilated. Right Atrium: Right atrial size was mildly dilated. Pericardium: There is no evidence of pericardial effusion. Mitral Valve: The mitral valve is degenerative in appearance. Trivial mitral valve regurgitation. No evidence of mitral valve stenosis. Tricuspid Valve: The tricuspid valve is normal in structure. Tricuspid valve regurgitation is mild . No evidence of tricuspid stenosis. Aortic Valve: The  aortic valve is normal in structure. There is mild calcification of the aortic valve. Aortic valve regurgitation is not visualized. No aortic stenosis is present. Pulmonic Valve: The pulmonic valve was normal in structure. Pulmonic valve regurgitation is trivial. No evidence of pulmonic stenosis. Aorta: The aortic root is normal in size and structure. Venous: The inferior vena cava is dilated in size with less than 50% respiratory variability, suggesting right atrial pressure of 15 mmHg. IAS/Shunts: No atrial level shunt detected by color flow Doppler.  LEFT VENTRICLE PLAX 2D LVIDd:         3.80 cm     Diastology LVIDs:         2.40 cm     LV e' medial:    7.51 cm/s LV  PW:         0.90 cm     LV E/e' medial:  9.2 LV IVS:        1.00 cm     LV e' lateral:   9.68 cm/s LVOT diam:     2.10 cm     LV E/e' lateral: 7.1 LV SV:         69 LV SV Index:   38          2D Longitudinal Strain LVOT Area:     3.46 cm    2D Strain GLS Avg:     -22.5 %  LV Volumes (MOD) LV vol d, MOD A2C: 72.9 ml 3D Volume EF: LV vol d, MOD A4C: 50.1 ml 3D EF:        60 % LV vol s, MOD A2C: 25.0 ml LV EDV:       95 ml LV vol s, MOD A4C: 18.0 ml LV ESV:       38 ml LV SV MOD A2C:     48.0 ml LV SV:        57 ml LV SV MOD A4C:     50.1 ml LV SV MOD BP:      42.2 ml RIGHT VENTRICLE             IVC RV S prime:     11.30 cm/s  IVC diam: 2.20 cm TAPSE (M-mode): 1.9 cm LEFT ATRIUM             Index        RIGHT ATRIUM           Index LA diam:        3.60 cm 1.97 cm/m   RA Area:     13.90 cm LA Vol (A2C):   37.8 ml 20.70 ml/m  RA Volume:   34.70 ml  19.00 ml/m LA Vol (A4C):   31.3 ml 17.14 ml/m LA Biplane Vol: 35.0 ml 19.16 ml/m  AORTIC VALVE LVOT Vmax:   98.10 cm/s LVOT Vmean:  62.200 cm/s LVOT VTI:    0.200 m  AORTA Ao Root diam: 3.20 cm Ao Asc diam:  3.50 cm MITRAL VALVE               TRICUSPID VALVE MV Area (PHT): 3.27 cm    TR Peak grad:   23.6 mmHg MV Decel Time: 232 msec    TR Vmax:        243.00 cm/s MV E velocity: 69.00 cm/s MV A velocity:  77.10 cm/s  SHUNTS MV E/A ratio:  0.89        Systemic VTI:  0.20 m                            Systemic Diam: 2.10 cm Arvilla Meres MD Electronically signed by Arvilla Meres MD Signature Date/Time: 05/27/2023/10:55:19 AM    Final

## 2023-06-04 NOTE — Addendum Note (Signed)
Encounter addended by: Ronny Bacon, PA-C on: 06/04/2023 4:33 PM  Actions taken: Clinical Note Signed

## 2023-06-10 ENCOUNTER — Encounter: Payer: Self-pay | Admitting: Radiation Oncology

## 2023-06-10 ENCOUNTER — Encounter: Payer: Self-pay | Admitting: Hematology and Oncology

## 2023-06-10 NOTE — Progress Notes (Signed)
 Radiation Oncology         [phone] ________________________________  Name: Maria Avila MRN: [id number]  Date: 06/11/2023  DOB: 28-Apr-1951  Follow-Up Visit Note  CC: Clarice Nottingham, MD  Viktoria Comer SAUNDERS, MD    ICD-10-CM   1. Malignant neoplasm of overlapping sites of cervix [facility]) [C53.8]  C53.8       Diagnosis: The encounter diagnosis was Endometrial cancer (HCC).   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   Indication for treatment:  Curative       Interval Since Last Radiation:  1 month 1 day  Radiation treatment dates:    First Treatment Date: 2023-04-20 Last Treatment Date: 2023-05-11   Site/Dose/Technique/Mode:  Plan Name: [id number] Site: Vagina Technique: HDR Ir-192 Mode: Brachytherapy Dose Per Fraction: 6 Gy Prescribed Dose (Delivered / Prescribed): 30 Gy / 30 Gy Prescribed Fxs (Delivered / Prescribed): 5 / 5   Narrative:  The patient returns today for routine follow-up. She was last seen in office on 04-20-23 for a follow up. Since then, she has completed radiation therapy which she tolerated quite well with no reported side effects other than fatigue.  She presented for a follow up visit with Dr. Lonn on 05-08-23. She reported feeling quite well and denied any symptoms or concerns indicating disease recurrence. she was noted as NED on examination. Most recent follow up with Dr. Lonn on 05-29-23, they discussed recent EKG results which indicated preserved ejection fraction. She also reported feeling well overall and once again did not indicate any concerning symptoms. Dr. Lonn recommended continuing maintenance trastuzumab  with repeat imaging in March of  2025.   No other significant oncologic interval history since the patient was last seen.      Today the patient reports to be doing well overall since completing her radiation treatment. She denies any vaginal bleeding, abnormal discharge, abdominal pain or changes in her bowel/bladder habits. She was given dilators and educated on their use.   Allergies:  is allergic to crestor [rosuvastatin calcium ] and zocor [simvastatin].  Meds: Current Outpatient Medications  Medication Sig Dispense Refill   acetaminophen  (TYLENOL ) 325 MG tablet Take 1-2 tablets (325-650 mg total) by mouth every 4 (four) hours as needed for mild pain.     benazepril-hydrochlorthiazide (LOTENSIN HCT) 20-25 MG tablet Take 1 tablet by mouth daily.     [employer] VITAMIN C  500 MG tablet TAKE 1 TABLET BY MOUTH TWICE A DAY 100 tablet 0   ezetimibe  (ZETIA ) 10 MG tablet Take 10 mg by mouth daily.     lidocaine -prilocaine  (EMLA ) cream Apply to affected area once (Patient taking differently: Apply 1 Application topically daily as needed (prior to port access).) 30 g 3   Multiple Vitamin (MULTIVITAMIN) capsule Take 1 capsule by mouth daily.     ondansetron  (ZOFRAN ) 8 MG tablet Take 1 tablet (8 mg total) by mouth every 8 (eight) hours as needed for nausea. 30 tablet 3   prochlorperazine  (COMPAZINE ) 10 MG tablet Take 1 tablet (10 mg total) by mouth every 6 (six) hours as needed for nausea or vomiting. 30 tablet 1   thiamine  (VITAMIN B-1) 100 MG tablet TAKE 1 TABLET BY MOUTH EVERY DAY 30 tablet 0   No current facility-administered medications for this encounter.    Physical Findings:  The patient is in no acute distress. Patient is alert and oriented.  height is 5' 2 (1.575 m) and weight is 180 lb 8 oz (81.9 kg). Her temporal temperature is 96.8 F (36 C) (abnormal). Her blood pressure is 137/71 and her pulse is 64. Her respiration is 18 and oxygen saturation is 100%. .  No significant changes. Lungs are clear to auscultation bilaterally.  Heart has regular rate and rhythm. No palpable cervical, supraclavicular, or axillary adenopathy. Abdomen soft, non-tender, normal bowel sounds.   Lab Findings: Lab Results  Component Value Date   WBC 3.3 (L) 05/29/2023   HGB 10.4 (L) 05/29/2023   HCT 30.3 (L) 05/29/2023   MCV 102.4 (H) 05/29/2023   PLT 132 (L) 05/29/2023    Radiographic Findings: ECHOCARDIOGRAM COMPLETE Result Date: 05/27/2023    ECHOCARDIOGRAM REPORT   Patient Name:   Maria Avila Date of Exam: 05/27/2023 Medical Rec #:  [id number]     Height:       62.0 in Accession #:    [id number]    Weight:       179.6 lb Date of Birth:  26-Aug-1950     BSA:          1.826 m Patient Age:    72 years      BP:           123/79 mmHg Patient Gender: F             HR:           60 bpm. Exam Location:  Outpatient Procedure: 2D Echo, 3D Echo, Cardiac Doppler, Color Doppler and Strain Analysis Indications:    Z51.11 Encounter for antineoplastic chemotheraphy  History:        Patient has prior history of Echocardiogram examinations, most                 recent 02/20/2023. Abnormal ECG, Stroke, Arrythmias:Tachycardia,                 Signs/Symptoms:Hypotension; Risk Factors:Hypertension and                 Diabetes. Cancer. ETOH.  Sonographer:    Ellouise Mose RDCS Referring Phys: [id number] [facility]  Sonographer Comments: Image acquisition challenging due to patient body habitus. IMPRESSIONS  1. Left ventricular ejection fraction, by estimation, is 65 to 70%. The left ventricle has normal function. The left ventricle has no regional wall motion abnormalities. There is mild left ventricular hypertrophy. Left ventricular diastolic parameters are consistent with Grade I diastolic dysfunction (impaired relaxation). The average left ventricular global longitudinal strain is -22.5 %. The global longitudinal strain is normal.  2. Right ventricular systolic function is normal. The right ventricular size is normal. There is normal pulmonary artery systolic pressure.  3.  Left atrial size was moderately dilated.  4. Right atrial size was mildly dilated.  5. The mitral valve is degenerative. Trivial mitral valve regurgitation. No evidence of mitral stenosis.  6. The aortic valve is normal in structure. There is mild calcification of the aortic valve. Aortic valve regurgitation is not visualized. No aortic stenosis is present.  7. The inferior vena cava is dilated in size with <50% respiratory variability, suggesting right atrial pressure of 15 mmHg. FINDINGS  Left Ventricle: Left ventricular ejection fraction, by estimation, is 65 to 70%. The left ventricle has normal function. The left ventricle has no regional wall motion abnormalities. The average left ventricular global longitudinal strain is -22.5 %. The global longitudinal  strain is normal. The left ventricular internal cavity size was normal in size. There is mild left ventricular hypertrophy. Left ventricular diastolic parameters are consistent with Grade I diastolic dysfunction (impaired relaxation). Right Ventricle: The right ventricular size is normal. No increase in right ventricular wall thickness. Right ventricular systolic function is normal. There is normal pulmonary artery systolic pressure. The tricuspid regurgitant velocity is 2.43 m/s, and  with an assumed right atrial pressure of 8 mmHg, the estimated right ventricular systolic pressure is 31.6 mmHg. Left Atrium: Left atrial size was moderately dilated. Right Atrium: Right atrial size was mildly dilated. Pericardium: There is no evidence of pericardial effusion. Mitral Valve: The mitral valve is degenerative in appearance. Trivial mitral valve regurgitation. No evidence of mitral valve stenosis. Tricuspid Valve: The tricuspid valve is normal in structure. Tricuspid valve regurgitation is mild . No evidence of tricuspid stenosis. Aortic Valve: The aortic valve is normal in structure. There is mild calcification of the aortic valve. Aortic valve regurgitation is not  visualized. No aortic stenosis is present. Pulmonic Valve: The pulmonic valve was normal in structure. Pulmonic valve regurgitation is trivial. No evidence of pulmonic stenosis. Aorta: The aortic root is normal in size and structure. Venous: The inferior vena cava is dilated in size with less than 50% respiratory variability, suggesting right atrial pressure of 15 mmHg. IAS/Shunts: No atrial level shunt detected by color flow Doppler.  LEFT VENTRICLE PLAX 2D LVIDd:         3.80 cm     Diastology LVIDs:         2.40 cm     LV e' medial:    7.51 cm/s LV PW:         0.90 cm     LV E/e' medial:  9.2 LV IVS:        1.00 cm     LV e' lateral:   9.68 cm/s LVOT diam:     2.10 cm     LV E/e' lateral: 7.1 LV SV:         69 LV SV Index:   38          2D Longitudinal Strain LVOT Area:     3.46 cm    2D Strain GLS Avg:     -22.5 %  LV Volumes (MOD) LV vol d, MOD A2C: 72.9 ml 3D Volume EF: LV vol d, MOD A4C: 50.1 ml 3D EF:        60 % LV vol s, MOD A2C: 25.0 ml LV EDV:       95 ml LV vol s, MOD A4C: 18.0 ml LV ESV:       38 ml LV SV MOD A2C:     48.0 ml LV SV:        57 ml LV SV MOD A4C:     50.1 ml LV SV MOD BP:      42.2 ml RIGHT VENTRICLE             IVC RV S prime:     11.30 cm/s  IVC diam: 2.20 cm TAPSE (M-mode): 1.9 cm LEFT ATRIUM             Index        RIGHT ATRIUM           Index LA diam:        3.60 cm 1.97 cm/m   RA Area:     13.90 cm LA Vol (A2C):   37.8 ml 20.70  ml/m  RA Volume:   34.70 ml  19.00 ml/m LA Vol (A4C):   31.3 ml 17.14 ml/m LA Biplane Vol: 35.0 ml 19.16 ml/m  AORTIC VALVE LVOT Vmax:   98.10 cm/s LVOT Vmean:  62.200 cm/s LVOT VTI:    0.200 m  AORTA Ao Root diam: 3.20 cm Ao Asc diam:  3.50 cm MITRAL VALVE               TRICUSPID VALVE MV Area (PHT): 3.27 cm    TR Peak grad:   23.6 mmHg MV Decel Time: 232 msec    TR Vmax:        243.00 cm/s MV E velocity: 69.00 cm/s MV A velocity: 77.10 cm/s  SHUNTS MV E/A ratio:  0.89        Systemic VTI:  0.20 m                            Systemic Diam: 2.10 cm  Toribio Fuel MD Electronically signed by Toribio Fuel MD Signature Date/Time: 05/27/2023/10:55:19 AM    Final     Impression/Plan: The encounter diagnosis was Endometrial cancer (HCC).   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 Per NCCN guidelines, patient will follow up with Dr. Viktoria in 2 months and radiation oncology in 5 months. Patient was educated on signs/symptoms that may indicate disease recurrence and understands to notify us  if she experiences any of them.   She continues on maintenance immunotherapy and seems to be tolerating this well. She will get restaging scans in March and follow-up with Dr. Lonn to review them.  20 minutes of total time was spent for this patient encounter, including preparation, face-to-face counseling with the patient and coordination of care, physical exam, and documentation of the encounter. ____________________________________   Leeroy Due, PA-C   This document serves as a record of services personally performed by Leeroy Due, PA-C. It was created on his behalf by Reymundo Cartwright, a trained medical scribe. The creation of this record is based on the scribe's personal observations and the provider's statements to them. This document has been checked and approved by the attending provider.

## 2023-06-10 NOTE — Progress Notes (Signed)
  Radiation Oncology         [phone] ________________________________  Name: Maria Avila MRN: [id number]  Date: 06/11/2023  DOB: 12-Nov-1950  End of Treatment Note  Diagnosis:  The encounter diagnosis was Endometrial cancer (HCC).   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, peritoneal, and omental metastatic disease; Her2+; MMR normal; p53 and p16 positive; >90% myometrial invasion; and with stromal involvement: s/p neoadjuvant chemotherapy, followed by hysterectomy, BSO, omentectomy, and debulking, now undergoing adjuvant chemotherapy     Indication for treatment:  Curative       Radiation treatment dates:    First Treatment Date: 2023-04-20 Last Treatment Date: 2023-05-11   Site/Dose/Technique/Mode:  Plan Name: [id number]_[id number] Site: Vagina Technique: HDR Ir-192 Mode: Brachytherapy Dose Per Fraction: 6 Gy Prescribed Dose (Delivered / Prescribed): 30 Gy / 30 Gy Prescribed Fxs (Delivered / Prescribed): 5 / 5  Narrative: The patient tolerated radiation treatment relatively well. She did not report any significant symptoms associated with treatment other than fatigue.    Plan: The patient has completed radiation treatment. The patient will return to radiation oncology clinic for routine followup in one month. I advised them to call or return sooner if they have any questions or concerns related to their recovery or treatment.  -----------------------------------  Lynwood CHARM Nasuti, PhD, MD  This document serves as a record of services personally performed by Lynwood Nasuti, MD. It was created on his behalf by Reymundo Cartwright, a trained medical scribe. The creation of this record is based on the scribe's personal observations and the provider's statements to them. This document has been checked and approved by the attending provider.

## 2023-06-10 NOTE — Progress Notes (Signed)
 Maria Avila is here today for follow up post radiation to the pelvic.  They completed their radiation on: 05/11/23   Does the patient complain of any of the following:  Pain: No Abdominal bloating: No Diarrhea/Constipation: No Nausea/Vomiting: No Vaginal Discharge: No Blood in Urine or Stool: No Urinary Issues (dysuria/incomplete emptying/ incontinence/ increased frequency/urgency): No Does patient report using vaginal dilator 2-3 times a week and/or sexually active 2-3 weeks: Patient given xs+ and s vaginal dilators with instructions and importance of use. Patient voiced understanding.  Post radiation skin changes: No   Additional comments if applicable:  BP 137/71 (BP Location: Left Arm, Patient Position: Sitting)   Pulse 64   Temp (!) 96.8 F (36 C) (Temporal)   Resp 18   Ht 5' 2 (1.575 m)   Wt 180 lb 8 oz (81.9 kg)   SpO2 100%   BMI 33.01 kg/m

## 2023-06-10 NOTE — Progress Notes (Signed)
 Returned call to patient's spouse regarding grant balance. He verbalized understanding.  They have my card for any additional financial questions or concerns.

## 2023-06-11 ENCOUNTER — Ambulatory Visit
Admission: RE | Admit: 2023-06-11 | Discharge: 2023-06-11 | Disposition: A | Discharge status: Home / Self Care | Payer: Medicare Other | Source: Ambulatory Visit | Attending: Radiation Oncology | Admitting: Radiation Oncology

## 2023-06-11 ENCOUNTER — Encounter: Payer: Self-pay | Admitting: Radiation Oncology

## 2023-06-11 VITALS — BP 137/71 | HR 64 | Temp 96.8°F | Resp 18 | Ht 62.0 in | Wt 180.5 lb

## 2023-06-11 DIAGNOSIS — C786 Secondary malignant neoplasm of retroperitoneum and peritoneum: Secondary | ICD-10-CM | POA: Insufficient documentation

## 2023-06-11 DIAGNOSIS — C779 Secondary and unspecified malignant neoplasm of lymph node, unspecified: Secondary | ICD-10-CM | POA: Diagnosis not present

## 2023-06-11 DIAGNOSIS — C55 Malignant neoplasm of uterus, part unspecified: Secondary | ICD-10-CM | POA: Diagnosis present

## 2023-06-11 DIAGNOSIS — Z90710 Acquired absence of both cervix and uterus: Secondary | ICD-10-CM | POA: Insufficient documentation

## 2023-06-11 DIAGNOSIS — Z90722 Acquired absence of ovaries, bilateral: Secondary | ICD-10-CM | POA: Insufficient documentation

## 2023-06-11 DIAGNOSIS — Z9221 Personal history of antineoplastic chemotherapy: Secondary | ICD-10-CM | POA: Insufficient documentation

## 2023-06-11 DIAGNOSIS — Z923 Personal history of irradiation: Secondary | ICD-10-CM | POA: Diagnosis not present

## 2023-06-11 DIAGNOSIS — C538 Malignant neoplasm of overlapping sites of cervix uteri: Secondary | ICD-10-CM

## 2023-06-11 HISTORY — DX: Personal history of irradiation: Z92.3

## 2023-06-19 ENCOUNTER — Inpatient Hospital Stay: Payer: Medicare Other

## 2023-06-19 ENCOUNTER — Inpatient Hospital Stay (HOSPITAL_BASED_OUTPATIENT_CLINIC_OR_DEPARTMENT_OTHER): Payer: Medicare Other | Admitting: Hematology and Oncology

## 2023-06-19 ENCOUNTER — Inpatient Hospital Stay: Payer: Medicare Other | Attending: Gynecologic Oncology

## 2023-06-19 ENCOUNTER — Encounter: Payer: Self-pay | Admitting: Hematology and Oncology

## 2023-06-19 VITALS — BP 141/70 | HR 62 | Temp 98.9°F | Resp 18 | Ht 62.0 in | Wt 181.8 lb

## 2023-06-19 VITALS — BP 129/67 | HR 64 | Resp 16

## 2023-06-19 DIAGNOSIS — C55 Malignant neoplasm of uterus, part unspecified: Secondary | ICD-10-CM

## 2023-06-19 DIAGNOSIS — Z5112 Encounter for antineoplastic immunotherapy: Secondary | ICD-10-CM | POA: Diagnosis not present

## 2023-06-19 DIAGNOSIS — C541 Malignant neoplasm of endometrium: Secondary | ICD-10-CM | POA: Insufficient documentation

## 2023-06-19 DIAGNOSIS — D61818 Other pancytopenia: Secondary | ICD-10-CM

## 2023-06-19 DIAGNOSIS — C775 Secondary and unspecified malignant neoplasm of intrapelvic lymph nodes: Secondary | ICD-10-CM | POA: Insufficient documentation

## 2023-06-19 LAB — COMPREHENSIVE METABOLIC PANEL
ALT: 10 U/L (ref 0–44)
AST: 14 U/L — ABNORMAL LOW (ref 15–41)
Albumin: 4.3 g/dL (ref 3.5–5.0)
Alkaline Phosphatase: 52 U/L (ref 38–126)
Anion gap: 7 (ref 5–15)
BUN: 15 mg/dL (ref 8–23)
CO2: 27 mmol/L (ref 22–32)
Calcium: 10 mg/dL (ref 8.9–10.3)
Chloride: 105 mmol/L (ref 98–111)
Creatinine, Ser: 0.68 mg/dL (ref 0.44–1.00)
GFR, Estimated: >60 mL/min (ref >60)
Glucose, Bld: 85 mg/dL (ref 70–99)
Potassium: 3.6 mmol/L (ref 3.5–5.1)
Sodium: 139 mmol/L (ref 135–145)
Total Bilirubin: 0.5 mg/dL (ref 0.0–1.2)
Total Protein: 7.3 g/dL (ref 6.5–8.1)

## 2023-06-19 LAB — CBC WITH DIFFERENTIAL/PLATELET
Abs Immature Granulocytes: 0 10*3/uL (ref 0.00–0.07)
Basophils Absolute: 0 10*3/uL (ref 0.0–0.1)
Basophils Relative: 0 %
Eosinophils Absolute: 0.1 10*3/uL (ref 0.0–0.5)
Eosinophils Relative: 2 %
HCT: 31.3 % — ABNORMAL LOW (ref 36.0–46.0)
Hemoglobin: 10.6 g/dL — ABNORMAL LOW (ref 12.0–15.0)
Immature Granulocytes: 0 %
Lymphocytes Relative: 59 %
Lymphs Abs: 2 10*3/uL (ref 0.7–4.0)
MCH: 34.4 pg — ABNORMAL HIGH (ref 26.0–34.0)
MCHC: 33.9 g/dL (ref 30.0–36.0)
MCV: 101.6 fL — ABNORMAL HIGH (ref 80.0–100.0)
Monocytes Absolute: 0.2 10*3/uL (ref 0.1–1.0)
Monocytes Relative: 7 %
Neutro Abs: 1.1 10*3/uL — ABNORMAL LOW (ref 1.7–7.7)
Neutrophils Relative %: 32 %
Platelets: 149 10*3/uL — ABNORMAL LOW (ref 150–400)
RBC: 3.08 MIL/uL — ABNORMAL LOW (ref 3.87–5.11)
RDW: 12.7 % (ref 11.5–15.5)
WBC: 3.5 10*3/uL — ABNORMAL LOW (ref 4.0–10.5)
nRBC: 0 % (ref 0.0–0.2)

## 2023-06-19 MED ORDER — TRASTUZUMAB-ANNS CHEMO 150 MG IV SOLR
6.0000 mg/kg | Freq: Once | INTRAVENOUS | Status: AC
  Administered 2023-06-19: 483 mg via INTRAVENOUS
  Filled 2023-06-19: qty 23

## 2023-06-19 MED ORDER — SODIUM CHLORIDE 0.9% FLUSH
10.0000 mL | Freq: Once | INTRAVENOUS | Status: AC
Start: 2023-06-19 — End: 2023-06-19
  Administered 2023-06-19: 10 mL

## 2023-06-19 MED ORDER — HEPARIN SOD (PORK) LOCK FLUSH 100 UNIT/ML IV SOLN
500.0000 [IU] | Freq: Once | INTRAVENOUS | Status: AC | PRN
  Administered 2023-06-19: 500 [IU]

## 2023-06-19 MED ORDER — ACETAMINOPHEN 325 MG PO TABS
650.0000 mg | ORAL_TABLET | Freq: Once | ORAL | Status: AC
  Administered 2023-06-19: 650 mg via ORAL
  Filled 2023-06-19: qty 2

## 2023-06-19 MED ORDER — LIDOCAINE-PRILOCAINE 2.5-2.5 % EX CREA
1.0000 | TOPICAL_CREAM | Freq: Every day | CUTANEOUS | 1 refills | Status: AC | PRN
Start: 2023-06-19 — End: ?

## 2023-06-19 MED ORDER — SODIUM CHLORIDE 0.9 % IV SOLN: Freq: Once | INTRAVENOUS | Status: AC

## 2023-06-19 MED ORDER — SODIUM CHLORIDE 0.9% FLUSH
10.0000 mL | INTRAVENOUS | Status: DC | PRN
Start: 2023-06-19 — End: 2023-06-19
  Administered 2023-06-19: 10 mL

## 2023-06-19 MED ORDER — OXYCODONE HCL 5 MG PO TABS: 5.0000 mg | ORAL_TABLET | Freq: Four times a day (QID) | ORAL | 0 refills | Status: DC | PRN

## 2023-06-19 NOTE — Patient Instructions (Signed)
[facility] - A DEPT OF [facility]  Discharge Instructions: Thank you for choosing [facility] to provide your oncology and hematology care.   If you have a lab appointment with the Cancer Center, please go directly to the Cancer Center and check in at the registration area.   Wear comfortable clothing and clothing appropriate for easy access to any Portacath or PICC line.   We strive to give you quality time with your provider. You may need to reschedule your appointment if you arrive late (15 or more minutes).  Arriving late affects you and other patients whose appointments are after yours.  Also, if you miss three or more appointments without notifying the office, you may be dismissed from the clinic at the provider's discretion.      For prescription refill requests, have your pharmacy contact our office and allow 72 hours for refills to be completed.    Today you received the following chemotherapy and/or immunotherapy agents: Kanjinti      To help prevent nausea and vomiting after your treatment, we encourage you to take your nausea medication as directed.  BELOW ARE SYMPTOMS THAT SHOULD BE REPORTED IMMEDIATELY: *FEVER GREATER THAN 100.4 F (38 C) OR HIGHER *CHILLS OR SWEATING *NAUSEA AND VOMITING THAT IS NOT CONTROLLED WITH YOUR NAUSEA MEDICATION *UNUSUAL SHORTNESS OF BREATH *UNUSUAL BRUISING OR BLEEDING *URINARY PROBLEMS (pain or burning when urinating, or frequent urination) *BOWEL PROBLEMS (unusual diarrhea, constipation, pain near the anus) TENDERNESS IN MOUTH AND THROAT WITH OR WITHOUT PRESENCE OF ULCERS (sore throat, sores in mouth, or a toothache) UNUSUAL RASH, SWELLING OR PAIN  UNUSUAL VAGINAL DISCHARGE OR ITCHING   Items with * indicate a potential emergency and should be followed up as soon as possible or go to the Emergency Department if any problems should occur.  Please show the CHEMOTHERAPY ALERT CARD or IMMUNOTHERAPY  ALERT CARD at check-in to the Emergency Department and triage nurse.  Should you have questions after your visit or need to cancel or reschedule your appointment, please contact [facility] - A DEPT OF [facility]  Dept: [phone]  and follow the prompts.  Office hours are 8:00 a.m. to 4:30 p.m. Monday - Friday. Please note that voicemails left after 4:00 p.m. may not be returned until the following business day.  We are closed weekends and major holidays. You have access to a nurse at all times for urgent questions. Please call the main number to the clinic Dept: [phone] and follow the prompts.   For any non-urgent questions, you may also contact your provider using MyChart. We now offer e-Visits for anyone 60 and older to request care online for non-urgent symptoms. For details visit [email or web address].   Also download the MyChart app! Go to the app store, search "MyChart", open the app, select [facility], and log in with your MyChart username and password.

## 2023-06-19 NOTE — Assessment & Plan Note (Signed)
Her last imaging study showed excellent response to therapy There are no signs of residual disease She will continue maintenance trastuzumab Echocardiogram from Jan 2025 showed preserved ejection fraction  I plan to repeat imaging study again before her next appt

## 2023-06-19 NOTE — Assessment & Plan Note (Signed)
Due to recent treatment She is not symptomatic I anticipate full recovery in the future after time away from chemotherapy Her recent vitamin B12 level was adequate I recommend the patient to start graduated exercise as tolerated Exercise can improve her blood counts

## 2023-06-19 NOTE — Progress Notes (Signed)
 [facility] OFFICE PROGRESS NOTE  Patient Care Team: Merri Brunette, MD as PCP - General (Internal Medicine) Paulina Fusi Servando Snare, RN as Oncology Nurse Navigator (Oncology)  ASSESSMENT & PLAN:  Uterine cancer [facility]) Her last imaging study showed excellent response to therapy There are no signs of residual disease She will continue maintenance trastuzumab Echocardiogram from Jan 2025 showed preserved ejection fraction  I plan to repeat imaging study again before her next appt  Pancytopenia, acquired (HCC) Due to recent treatment She is not symptomatic I anticipate full recovery in the future after time away from chemotherapy Her recent vitamin B12 level was adequate I recommend the patient to start graduated exercise as tolerated Exercise can improve her blood counts  Orders Placed This Encounter  Procedures   CT ABDOMEN PELVIS W CONTRAST    Standing Status:   Future    Expected Date:   07/03/2023    Expiration Date:   06/18/2024    Scheduling Instructions:     No need oral contrast    If indicated for the ordered procedure, I authorize the administration of contrast media per Radiology protocol:   Yes    Does the patient have a contrast media/X-ray dye allergy?:   No    Preferred imaging location?:   [facility]    If indicated for the ordered procedure, I authorize the administration of oral contrast media per Radiology protocol:   Yes    All questions were answered. The patient knows to call the clinic with any problems, questions or concerns. The total time spent in the appointment was 30 minutes encounter with patients including review of chart and various tests results, discussions about plan of care and coordination of care plan   Artis Delay, MD 06/19/2023 8:48 AM  INTERVAL HISTORY: Please see below for problem oriented charting. she returns for chemo follow-up with her husband She is doing well Denies side effects from treatment We reviewed test  results We discussed timing of her next imaging  REVIEW OF SYSTEMS:   Constitutional: Denies fevers, chills or abnormal weight loss Eyes: Denies blurriness of vision Ears, nose, mouth, throat, and face: Denies mucositis or sore throat Respiratory: Denies cough, dyspnea or wheezes Cardiovascular: Denies palpitation, chest discomfort or lower extremity swelling Gastrointestinal:  Denies nausea, heartburn or change in bowel habits Skin: Denies abnormal skin rashes Lymphatics: Denies new lymphadenopathy or easy bruising Neurological:Denies numbness, tingling or new weaknesses Behavioral/Psych: Mood is stable, no new changes  All other systems were reviewed with the patient and are negative.  I have reviewed the past medical history, past surgical history, social history and family history with the patient and they are unchanged from previous note.  ALLERGIES:  is allergic to crestor [rosuvastatin calcium] and zocor [simvastatin].  MEDICATIONS:  Current Outpatient Medications  Medication Sig Dispense Refill   oxyCODONE (OXY IR/ROXICODONE) 5 MG immediate release tablet Take 1 tablet (5 mg total) by mouth every 6 (six) hours as needed for severe pain (pain score 7-10). 30 tablet 0   acetaminophen (TYLENOL) 325 MG tablet Take 1-2 tablets (325-650 mg total) by mouth every 4 (four) hours as needed for mild pain.     benazepril-hydrochlorthiazide (LOTENSIN HCT) 20-25 MG tablet Take 1 tablet by mouth daily.     [employer] VITAMIN C 500 MG tablet TAKE 1 TABLET BY MOUTH TWICE A DAY 100 tablet 0   ezetimibe (ZETIA) 10 MG tablet Take 10 mg by mouth daily.     lidocaine-prilocaine (EMLA) cream  Apply 1 Application topically daily as needed (prior to port access). 30 g 1   Multiple Vitamin (MULTIVITAMIN) capsule Take 1 capsule by mouth daily.     ondansetron (ZOFRAN) 8 MG tablet Take 1 tablet (8 mg total) by mouth every 8 (eight) hours as needed for nausea. 30 tablet 3   prochlorperazine (COMPAZINE) 10 MG  tablet Take 1 tablet (10 mg total) by mouth every 6 (six) hours as needed for nausea or vomiting. 30 tablet 1   thiamine (VITAMIN B-1) 100 MG tablet TAKE 1 TABLET BY MOUTH EVERY DAY 30 tablet 0   No current facility-administered medications for this visit.    SUMMARY OF ONCOLOGIC HISTORY: Oncology History Overview Note  MMR normal, Her2 positive, FISh 3+, MSI stable High grade serous PIK3CA pathogenic variant, p53 mutated   Uterine cancer (HCC)  08/12/2022 Initial Diagnosis   She presented with PMB   09/24/2022 Pathology Results   Diagnosis 1. Endometrium, curettage - HIGH GRADE SEROUS CARCINOMA (SEE NOTE) 2. Endometrium, resection - HIGH GRADE SEROUS CARCINOMA (SEE NOTE) Diagnosis Note 1. - immunohistochemical stain reveal tumor cells are positive for p16. P53 shows strong expression consistent with mutant type. This case was reviewed with Dr. Reynolds Bowl who agrees with the diagnosis   10/14/2022 Imaging   1. Ill-defined fullness of the uterus and endometrial cavity without discretely visualized mass, in keeping with patient's known endometrial malignancy. 2. Enlarged right iliac and pelvic sidewall lymph nodes, as well as enlarged epicardial lymph nodes or soft tissue nodules. 3. Scattered peritoneal and omental soft tissue nodularity, particularly notable in the left upper quadrant. 4. Soft tissue matting about the distal sigmoid colon and rectum in the low pelvis. 5. Constellation of findings is consistent with nodal, peritoneal, and omental metastatic disease. 6. Small right pleural effusion, nonspecific although modestly suspicious for a malignant effusion, however without directly visualized pleural mass or nodularity. 7. Pancolonic diverticulosis, severe in the descending and sigmoid colon. 8. Cholelithiasis. 9. Coronary artery disease.   10/17/2022 Initial Diagnosis   Uterine cancer (HCC)   10/17/2022 Cancer Staging   Staging form: Corpus Uteri - Carcinoma and Carcinosarcoma, AJCC  8th Edition - Clinical stage from 10/17/2022: FIGO Stage IVB (cT1b, cN1, cM1) - Signed by Artis Delay, MD on 10/17/2022 Stage prefix: Initial diagnosis   10/23/2022 Echocardiogram   1. Left ventricular ejection fraction, by estimation, is 65 to 70%. Left ventricular ejection fraction by 2D MOD biplane is 68.1 %. The left ventricle has normal function. The left ventricle has no regional wall motion abnormalities. There is mild left ventricular hypertrophy. Left ventricular diastolic parameters are consistent with Grade I diastolic dysfunction (impaired relaxation).  2. Right ventricular systolic function is normal. The right ventricular size is normal. There is normal pulmonary artery systolic pressure. The estimated right ventricular systolic pressure is 27.6 mmHg.  3. The mitral valve is abnormal. Trivial mitral valve regurgitation.  4. The aortic valve is tricuspid. Aortic valve regurgitation is not visualized. Aortic valve sclerosis is present, with no evidence of aortic valve stenosis.  5. The inferior vena cava is normal in size with greater than 50% respiratory variability, suggesting right atrial pressure of 3 mmHg.   10/28/2022 -  Chemotherapy   Patient is on Treatment Plan : UTERINE SEROUS CARCINOMA Carboplatin + Paclitaxel + Trastuzumab q21d x 6 Cycles / Trastuzumab q21d     12/23/2022 Imaging   CT ABDOMEN PELVIS W CONTRAST  Result Date: 12/23/2022 CLINICAL DATA:  Follow-up endometrial cancer, status post chemotherapy EXAM: CT  ABDOMEN AND PELVIS WITH CONTRAST TECHNIQUE: Multidetector CT imaging of the abdomen and pelvis was performed using the standard protocol following bolus administration of intravenous contrast. RADIATION DOSE REDUCTION: This exam was performed according to the departmental dose-optimization program which includes automated exposure control, adjustment of the mA and/or kV according to patient size and/or use of iterative reconstruction technique. CONTRAST:  75mL OMNIPAQUE  IOHEXOL 350 MG/ML SOLN COMPARISON:  10/14/2022 FINDINGS: Lower chest: Small right pleural effusion. Associated bilateral lower lobe atelectasis. Hepatobiliary: Liver is within normal limits. Layering small gallstones (series 3/image 27), without associated inflammatory changes. No intrahepatic or extrahepatic duct dilatation. Pancreas: Within normal limits. Spleen: Within normal limits. Adrenals/Urinary Tract: Adrenal glands are within normal limits. Kidneys are within normal limits.  No hydronephrosis. Bladder is within normal limits. Stomach/Bowel: Stomach is within normal limits. No evidence of bowel obstruction. Normal appendix (series 3/image 46). Left colonic diverticulosis, without evidence of diverticulitis. Vascular/Lymphatic: No evidence of abdominal aortic aneurysm. Atherosclerotic calcifications of the abdominal aorta and branch vessels, although vessels remain patent. Small right iliac nodes, including a dominant 10 mm short axis right obturator node (series 3/image 56), previously 12 mm. Reproductive: Mildly heterogeneous uterus with endometrial thickening measuring up to 16 mm (series 7/image 33), corresponding to the patient's known endometrial cancer. No adnexal masses. Other: No abdominopelvic ascites. Mild peritoneal disease/omental caking beneath the left mid abdominal wall (series 3/image 45), mildly improved. Minimal perirectal soft tissue in the left lower pelvis (series 3/image 58), improved. Musculoskeletal: Degenerative changes of the visualized thoracolumbar spine. IMPRESSION: Mildly heterogeneous uterus with endometrial thickening, corresponding to the patient's known endometrial cancer. Small right pelvic nodal metastases, improved. Mild peritoneal disease/omental caking, improved Small right pleural effusion. Electronically Signed   By: Charline Bills M.D.   On: 12/23/2022 02:18      01/07/2023 Surgery   Robotic-assisted laparoscopic total hysterectomy with bilateral  salpingo-oophorectomy, lysis of adhesions, tumor debulking including peritoneal stripping, omentectomy, mini laparotomy    01/07/2023 Pathology Results   CASE: [id number] PATIENT: [place]in[place] Surgical Pathology Report  Reason for Addendum #1:  DNA Mismatch Repair IHC Results  Clinical History: advanced uterine cancer     FINAL MICROSCOPIC DIAGNOSIS:  A. UTERUS, CERVIX, BILATERAL FALLOPIAN TUBES AND OVARIES, RESECTION: - High-grade serous carcinoma, involving the entire endometrial surface, see comment - Carcinoma invades for a depth of 1.6 cm myometrial thickness is 1.7 cm (> 90%) - Cervical stroma is involved by carcinoma - No evidence of lymphovascular invasion - Carcinoma involves surface of bilateral ovaries and left fallopian tube - Benign leiomyomata - See oncology table  B. POSTERIOR CUL DE SAC, EXCISION: - High-grade serous carcinoma with psammomatous calcifications  C. LEFT PELVIC SIDEWALL, EXCISION: - High-grade serous carcinoma with psammomatous calcifications  D. OMENTUM, EXCISION: - High-grade serous carcinoma with psammomatous calcifications  COMMENT:  Immunohistochemical stains for p53 and p16 are diffusely positive in the tumor cells (clonal overexpression pattern), consistent with above diagnosis.  Dr. Kenyon Ana reviewed the case and concurs with the above diagnosis.  ONCOLOGY TABLE:  UTERUS, CARCINOMA OR CARCINOSARCOMA: Resection  Procedure: Total hysterectomy and bilateral salpingo-oophorectomy Histologic Type: Serous carcinoma Histologic Grade: High-grade Myometrial Invasion:      Depth of Myometrial Invasion (mm): 16 mm      Myometrial Thickness (mm): 17 mm      Percentage of Myometrial Invasion: >90% Uterine Serosa Involvement: Not identified Cervical stromal Involvement: Present Extent of involvement of other tissue/organs: Bilateral ovaries, left fallopian tube and pelvic sidewall Peritoneal/Ascitic Fluid: Not applicable Lymphovascular  Invasion: Not identified Regional Lymph Nodes: Not applicable (no lymph nodes submitted or found)  Distant Metastasis:      Distant Site(s) Involved: Omentum Pathologic Stage Classification (pTNM, AJCC 8th Edition): pT3a, pN not assigned Ancillary Studies: MMR / MSI testing will be ordered    01/07/2023 Surgery   OPERATIVE NOTE  Pre-operative Diagnosis: 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  On intra-abdominal entry, normal upper abdominal survey.  Omentum adherent to the sigmoid epiploica and the descending colon.  5 mm tumor implant along the left pelvic brim.  Normal-appearing bilateral tubes and ovaries although the sigmoid colon and epiploica adherent to the medial aspect of the left adnexa and posterior uterus.  Miliary disease noted over the anterior aspect of the sigmoid colon, rectum, and posterior cul-de-sac peritoneum.  Some obliteration of the deep posterior cul-de-sac.  On rectal exam, approximately 7-8 cm above the anal verge, just superior to the cervicovaginal junction, there is some tethering of the rectum.  Some adhesions between the bladder and lower uterine segment.  Minimal pelvic ascites.  Medical comorbidities as well as what would have required a very low rectal resection and anastomosis to achieve complete cytoreduction with the need for a diverting ileostomy, decision made to remove the bulk of disease without bowel surgery.  Much of the pelvic peritoneum with miliary disease stripped within the cul-de-sac.   02/23/2023 Echocardiogram     1. Left ventricular ejection fraction, by estimation,  is 60 to 65%. Left ventricular ejection fraction by 3D volume is 61 %. The left ventricle has normal function. The left ventricle has no regional wall motion abnormalities. Left ventricular diastolic  parameters were normal. The average left ventricular global longitudinal strain is -20.9 %. The global longitudinal strain is normal.  2. Right ventricular systolic function is normal. The right ventricular size is normal. There is normal pulmonary artery systolic pressure. The estimated right ventricular systolic pressure is 25.7 mmHg.  3. The mitral valve is grossly normal. Trivial mitral valve regurgitation. No evidence of mitral stenosis.  4. The aortic valve is tricuspid. Aortic valve regurgitation is not visualized. No aortic stenosis is present.  5. The inferior vena cava is normal in size with greater than 50% respiratory variability, suggesting right atrial pressure of 3 mmHg.     04/03/2023 Imaging   CT CHEST ABDOMEN PELVIS W CONTRAST  Result Date: 04/09/2023 CLINICAL DATA:  Endometrial cancer monitoring. Assess response to chemotherapy. * Tracking Code: BO * EXAM: CT CHEST, ABDOMEN, AND PELVIS WITH CONTRAST TECHNIQUE: Multidetector CT imaging of the chest, abdomen and pelvis was performed following the standard protocol during bolus administration of intravenous contrast. RADIATION DOSE REDUCTION: This exam was performed according to the departmental dose-optimization program which includes automated exposure control, adjustment of the mA and/or kV according to patient size and/or use of iterative reconstruction technique. CONTRAST:  75mL OMNIPAQUE IOHEXOL 350 MG/ML SOLN COMPARISON:  Abdomen pelvis CT 12/17/2022. Chest abdomen and pelvis CT 10/14/2022. FINDINGS: CT CHEST FINDINGS Cardiovascular: Right upper chest port in place. Tip seen to the level of the SVC right atrial junction. Heart is nonenlarged. Coronary artery calcifications are seen. The thoracic aorta has some scattered mild  atherosclerotic  calcified plaque. There is some ectasia of the distal aortic arch of up to 3.5 cm in diameter. No dissection or aneurysm formation otherwise no pericardial effusion. Mediastinum/Nodes: No enlarged mediastinal, hilar, or axillary lymph nodes. Thyroid gland, trachea, and esophagus demonstrate no significant findings. Previous anterior cardiophrenic angle lymph node today measures 6 mm in diameter, not significantly changed when adjusted for technique Lungs/Pleura: There is some linear opacity lung bases likely scar or atelectasis. No consolidation, pneumothorax or effusion. Trace right pleural effusion is decreased further from previous no dominant lung nodule. Musculoskeletal: Mild degenerative changes along the spine. Curvature. Prominent degenerative changes of the shoulders as well. CT ABDOMEN PELVIS FINDINGS Hepatobiliary: Stones in the nondilated gallbladder. No space-occupying liver lesion. Patent portal vein. Pancreas: Unremarkable. No pancreatic ductal dilatation or surrounding inflammatory changes. Spleen: Normal in size without focal abnormality. Adrenals/Urinary Tract: Adrenal glands are unremarkable. Kidneys are normal, without renal calculi, focal lesion, or hydronephrosis. Bladder is unremarkable. Stomach/Bowel: Oral contrast was administered. Large bowel has a normal course and caliber. Diffuse diverticula identified along the colon greatest along the descending and sigmoid region. Stomach and small bowel are nondilated. Vascular/Lymphatic: Normal caliber aorta and IVC. Scattered atherosclerotic calcifications identified along the aorta and branch vessels. Previous right pelvic sidewall, iliac node which measured 10 mm in short axis, today on series 3, image 99 measures 8 mm. No new abnormal lymph node enlargement identified in the abdomen and pelvis Reproductive: Uterus is no longer seen. Please correlate with the history. Rounded areas low-density soft tissue right adnexa on series 3,  image 101 measures 2.7 by 2.1 cm. This could be postsurgical. Recommend close follow-up. Other: The nodular areas seen along the mesentery such as along the left side near the margin of the descending colon have decreased. Few residual smaller areas such as series 3, image 70 anterior to the descending colon. No developing ascites Musculoskeletal: Curvature of the spine. Scattered degenerative changes of the spine and pelvis. New nodular area of wall thickening along the anterior abdominal wall left side series 3, image 80 measuring 16 mm IMPRESSION: Interval hysterectomy with some low-density nodular tissue in the right adnexa. Attention on close follow up. Decreasing mesenteric areas of nodular thickening with some residual along the left side in particular. No developing ascites. Small right pelvic sidewall lymph node is decreasing in size today compared to previous pre-surgical CT. No new lymph node enlargement identified in the chest, abdomen and pelvis. New nodular areas of thickening along the left side of the anterior abdominal wall. Possibilities include related to recent surgery. Attention on follow-up. Gallstones.  Colonic diverticula Electronically Signed   By: Karen Kays M.D.   On: 04/09/2023 11:06      05/27/2023 Echocardiogram       1. Left ventricular ejection fraction, by estimation, is 65 to 70%. The left ventricle has normal function. The left ventricle has no regional wall motion abnormalities. There is mild left ventricular hypertrophy. Left ventricular diastolic parameters  are consistent with Grade I diastolic dysfunction (impaired relaxation). The average left ventricular global longitudinal strain is -22.5 %. The global longitudinal strain is normal.  2. Right ventricular systolic function is normal. The right ventricular size is normal. There is normal pulmonary artery systolic pressure.  3. Left atrial size was moderately dilated.  4. Right atrial size was mildly dilated.  5.  The mitral valve is degenerative. Trivial mitral valve regurgitation. No evidence of mitral stenosis.  6. The aortic valve is normal in structure. There is mild  calcification of the aortic valve. Aortic valve regurgitation is not visualized. No aortic stenosis is present.  7. The inferior vena cava is dilated in size with <50% respiratory variability, suggesting right atrial pressure of 15 mmHg.     PHYSICAL EXAMINATION: ECOG PERFORMANCE STATUS: 1 - Symptomatic but completely ambulatory  Vitals:   06/19/23 0839  BP: (!) 141/70  Pulse: 62  Resp: 18  Temp: 98.9 F (37.2 C)  SpO2: 99%   Filed Weights   06/19/23 0839  Weight: 181 lb 12.8 oz (82.5 kg)    GENERAL:alert, no distress and comfortable  NEURO: alert & oriented x 3 with fluent speech, no focal motor/sensory deficits  LABORATORY DATA:  I have reviewed the data as listed    Component Value Date/Time   NA 140 05/29/2023 0816   NA 144 05/13/2018 1500   K 3.4 (L) 05/29/2023 0816   CL 105 05/29/2023 0816   CO2 28 05/29/2023 0816   GLUCOSE 91 05/29/2023 0816   BUN 17 05/29/2023 0816   BUN 13 05/13/2018 1500   CREATININE 0.76 05/29/2023 0816   CREATININE 0.69 03/19/2023 0904   CALCIUM 10.2 05/29/2023 0816   PROT 7.1 05/29/2023 0816   ALBUMIN 4.3 05/29/2023 0816   AST 15 05/29/2023 0816   AST 14 (L) 03/19/2023 0904   ALT 11 05/29/2023 0816   ALT 11 03/19/2023 0904   ALKPHOS 48 05/29/2023 0816   BILITOT 0.5 05/29/2023 0816   BILITOT 0.4 03/19/2023 0904   GFRNONAA >60 05/29/2023 0816   GFRNONAA >60 03/19/2023 0904   GFRAA 110 05/13/2018 1500   GFRAA >60 07/21/2017 1131    No results found for: "SPEP", "UPEP"  Lab Results  Component Value Date   WBC 3.5 (L) 06/19/2023   NEUTROABS 1.1 (L) 06/19/2023   HGB 10.6 (L) 06/19/2023   HCT 31.3 (L) 06/19/2023   MCV 101.6 (H) 06/19/2023   PLT 149 (L) 06/19/2023      Chemistry      Component Value Date/Time   NA 140 05/29/2023 0816   NA 144 05/13/2018 1500   K  3.4 (L) 05/29/2023 0816   CL 105 05/29/2023 0816   CO2 28 05/29/2023 0816   BUN 17 05/29/2023 0816   BUN 13 05/13/2018 1500   CREATININE 0.76 05/29/2023 0816   CREATININE 0.69 03/19/2023 0904      Component Value Date/Time   CALCIUM 10.2 05/29/2023 0816   ALKPHOS 48 05/29/2023 0816   AST 15 05/29/2023 0816   AST 14 (L) 03/19/2023 0904   ALT 11 05/29/2023 0816   ALT 11 03/19/2023 0904   BILITOT 0.5 05/29/2023 0816   BILITOT 0.4 03/19/2023 0904

## 2023-06-20 ENCOUNTER — Other Ambulatory Visit: Payer: Self-pay

## 2023-06-29 ENCOUNTER — Other Ambulatory Visit: Payer: Self-pay

## 2023-06-29 ENCOUNTER — Encounter (HOSPITAL_COMMUNITY): Payer: Self-pay | Admitting: *Deleted

## 2023-06-29 ENCOUNTER — Emergency Department (HOSPITAL_COMMUNITY): Payer: Medicare Other

## 2023-06-29 ENCOUNTER — Observation Stay (HOSPITAL_COMMUNITY)
Admission: EM | Admit: 2023-06-29 | Discharge: 2023-07-03 | Disposition: A | Discharge status: Home / Self Care | Payer: Medicare Other | Attending: Internal Medicine | Admitting: Internal Medicine

## 2023-06-29 ENCOUNTER — Ambulatory Visit (HOSPITAL_COMMUNITY)
Admission: RE | Admit: 2023-06-29 | Discharge: 2023-06-29 | Disposition: A | Discharge status: Home / Self Care | Payer: Medicare Other | Source: Ambulatory Visit | Attending: Hematology and Oncology

## 2023-06-29 ENCOUNTER — Observation Stay (HOSPITAL_COMMUNITY): Payer: Medicare Other

## 2023-06-29 DIAGNOSIS — I10 Essential (primary) hypertension: Secondary | ICD-10-CM | POA: Insufficient documentation

## 2023-06-29 DIAGNOSIS — Z6833 Body mass index (BMI) 33.0-33.9, adult: Secondary | ICD-10-CM | POA: Diagnosis not present

## 2023-06-29 DIAGNOSIS — Z7901 Long term (current) use of anticoagulants: Secondary | ICD-10-CM | POA: Diagnosis not present

## 2023-06-29 DIAGNOSIS — D638 Anemia in other chronic diseases classified elsewhere: Secondary | ICD-10-CM | POA: Diagnosis not present

## 2023-06-29 DIAGNOSIS — R0602 Shortness of breath: Secondary | ICD-10-CM

## 2023-06-29 DIAGNOSIS — Z1152 Encounter for screening for COVID-19: Secondary | ICD-10-CM | POA: Diagnosis not present

## 2023-06-29 DIAGNOSIS — K5730 Diverticulosis of large intestine without perforation or abscess without bleeding: Secondary | ICD-10-CM | POA: Diagnosis not present

## 2023-06-29 DIAGNOSIS — Z7982 Long term (current) use of aspirin: Secondary | ICD-10-CM | POA: Insufficient documentation

## 2023-06-29 DIAGNOSIS — Z8542 Personal history of malignant neoplasm of other parts of uterus: Secondary | ICD-10-CM | POA: Diagnosis not present

## 2023-06-29 DIAGNOSIS — I700 Atherosclerosis of aorta: Secondary | ICD-10-CM | POA: Insufficient documentation

## 2023-06-29 DIAGNOSIS — Z79899 Other long term (current) drug therapy: Secondary | ICD-10-CM | POA: Diagnosis not present

## 2023-06-29 DIAGNOSIS — D649 Anemia, unspecified: Secondary | ICD-10-CM | POA: Diagnosis not present

## 2023-06-29 DIAGNOSIS — R2681 Unsteadiness on feet: Secondary | ICD-10-CM | POA: Insufficient documentation

## 2023-06-29 DIAGNOSIS — E869 Volume depletion, unspecified: Secondary | ICD-10-CM | POA: Diagnosis not present

## 2023-06-29 DIAGNOSIS — C799 Secondary malignant neoplasm of unspecified site: Secondary | ICD-10-CM | POA: Diagnosis not present

## 2023-06-29 DIAGNOSIS — M6281 Muscle weakness (generalized): Secondary | ICD-10-CM | POA: Insufficient documentation

## 2023-06-29 DIAGNOSIS — Z87891 Personal history of nicotine dependence: Secondary | ICD-10-CM | POA: Diagnosis not present

## 2023-06-29 DIAGNOSIS — Z8673 Personal history of transient ischemic attack (TIA), and cerebral infarction without residual deficits: Secondary | ICD-10-CM | POA: Diagnosis not present

## 2023-06-29 DIAGNOSIS — J90 Pleural effusion, not elsewhere classified: Secondary | ICD-10-CM | POA: Diagnosis not present

## 2023-06-29 DIAGNOSIS — I2510 Atherosclerotic heart disease of native coronary artery without angina pectoris: Secondary | ICD-10-CM | POA: Diagnosis not present

## 2023-06-29 DIAGNOSIS — C55 Malignant neoplasm of uterus, part unspecified: Principal | ICD-10-CM | POA: Diagnosis present

## 2023-06-29 DIAGNOSIS — Z9221 Personal history of antineoplastic chemotherapy: Secondary | ICD-10-CM | POA: Insufficient documentation

## 2023-06-29 DIAGNOSIS — E66811 Obesity, class 1: Secondary | ICD-10-CM | POA: Insufficient documentation

## 2023-06-29 DIAGNOSIS — I480 Paroxysmal atrial fibrillation: Secondary | ICD-10-CM | POA: Diagnosis not present

## 2023-06-29 DIAGNOSIS — R918 Other nonspecific abnormal finding of lung field: Secondary | ICD-10-CM | POA: Diagnosis not present

## 2023-06-29 DIAGNOSIS — Z923 Personal history of irradiation: Secondary | ICD-10-CM | POA: Diagnosis not present

## 2023-06-29 DIAGNOSIS — J9811 Atelectasis: Secondary | ICD-10-CM | POA: Diagnosis not present

## 2023-06-29 DIAGNOSIS — Z9889 Other specified postprocedural states: Secondary | ICD-10-CM

## 2023-06-29 DIAGNOSIS — E785 Hyperlipidemia, unspecified: Secondary | ICD-10-CM | POA: Insufficient documentation

## 2023-06-29 DIAGNOSIS — K8020 Calculus of gallbladder without cholecystitis without obstruction: Secondary | ICD-10-CM | POA: Diagnosis not present

## 2023-06-29 LAB — URINALYSIS, ROUTINE W REFLEX MICROSCOPIC
Bilirubin Urine: NEGATIVE
Glucose, UA: NEGATIVE mg/dL
Hgb urine dipstick: NEGATIVE
Ketones, ur: NEGATIVE mg/dL
Leukocytes,Ua: NEGATIVE
Nitrite: NEGATIVE
Protein, ur: NEGATIVE mg/dL
Specific Gravity, Urine: >1.046 — ABNORMAL HIGH (ref 1.005–1.030)
pH: 5 (ref 5.0–8.0)

## 2023-06-29 LAB — CBC
HCT: 32.8 % — ABNORMAL LOW (ref 36.0–46.0)
Hemoglobin: 10.8 g/dL — ABNORMAL LOW (ref 12.0–15.0)
MCH: 34.3 pg — ABNORMAL HIGH (ref 26.0–34.0)
MCHC: 32.9 g/dL (ref 30.0–36.0)
MCV: 104.1 fL — ABNORMAL HIGH (ref 80.0–100.0)
Platelets: 168 10*3/uL (ref 150–400)
RBC: 3.15 MIL/uL — ABNORMAL LOW (ref 3.87–5.11)
RDW: 12.8 % (ref 11.5–15.5)
WBC: 4.6 10*3/uL (ref 4.0–10.5)
nRBC: 0 % (ref 0.0–0.2)

## 2023-06-29 LAB — COMPREHENSIVE METABOLIC PANEL
ALT: 11 U/L (ref 0–44)
AST: 18 U/L (ref 15–41)
Albumin: 4 g/dL (ref 3.5–5.0)
Alkaline Phosphatase: 49 U/L (ref 38–126)
Anion gap: 9 (ref 5–15)
BUN: 12 mg/dL (ref 8–23)
CO2: 26 mmol/L (ref 22–32)
Calcium: 9.6 mg/dL (ref 8.9–10.3)
Chloride: 103 mmol/L (ref 98–111)
Creatinine, Ser: 0.5 mg/dL (ref 0.44–1.00)
GFR, Estimated: >60 mL/min (ref >60)
Glucose, Bld: 98 mg/dL (ref 70–99)
Potassium: 3.8 mmol/L (ref 3.5–5.1)
Sodium: 138 mmol/L (ref 135–145)
Total Bilirubin: 0.6 mg/dL (ref 0.0–1.2)
Total Protein: 7.6 g/dL (ref 6.5–8.1)

## 2023-06-29 LAB — MAGNESIUM: Magnesium: 1.5 mg/dL — ABNORMAL LOW (ref 1.7–2.4)

## 2023-06-29 LAB — BRAIN NATRIURETIC PEPTIDE: B Natriuretic Peptide: 28.7 pg/mL (ref 0.0–100.0)

## 2023-06-29 LAB — RESP PANEL BY RT-PCR (RSV, FLU A&B, COVID)  RVPGX2
Influenza A by PCR: NEGATIVE
Influenza B by PCR: NEGATIVE
Resp Syncytial Virus by PCR: NEGATIVE
SARS Coronavirus 2 by RT PCR: NEGATIVE

## 2023-06-29 LAB — TSH: TSH: 1.606 u[IU]/mL (ref 0.350–4.500)

## 2023-06-29 LAB — PHOSPHORUS: Phosphorus: 3.1 mg/dL (ref 2.5–4.6)

## 2023-06-29 LAB — TROPONIN I (HIGH SENSITIVITY): Troponin I (High Sensitivity): 3 ng/L (ref <18)

## 2023-06-29 MED ORDER — SODIUM CHLORIDE 0.9 % IV BOLUS
500.0000 mL | Freq: Once | INTRAVENOUS | Status: AC
  Administered 2023-06-29: 500 mL via INTRAVENOUS

## 2023-06-29 MED ORDER — ACETAMINOPHEN 650 MG RE SUPP: 650.0000 mg | Freq: Four times a day (QID) | RECTAL | Status: DC | PRN

## 2023-06-29 MED ORDER — OXYCODONE HCL 5 MG PO TABS
5.0000 mg | ORAL_TABLET | Freq: Four times a day (QID) | ORAL | Status: DC | PRN
  Administered 2023-06-30 – 2023-07-03 (×5): 5 mg via ORAL
  Filled 2023-06-29 (×5): qty 1

## 2023-06-29 MED ORDER — IOHEXOL 300 MG/ML  SOLN: 75.0000 mL | Freq: Once | INTRAMUSCULAR | Status: DC | PRN

## 2023-06-29 MED ORDER — SENNOSIDES-DOCUSATE SODIUM 8.6-50 MG PO TABS: 1.0000 | ORAL_TABLET | Freq: Every evening | ORAL | Status: DC | PRN

## 2023-06-29 MED ORDER — MAGNESIUM SULFATE 2 GM/50ML IV SOLN
2.0000 g | Freq: Once | INTRAVENOUS | Status: AC
  Administered 2023-06-29: 2 g via INTRAVENOUS
  Filled 2023-06-29: qty 50

## 2023-06-29 MED ORDER — SODIUM CHLORIDE (PF) 0.9 % IJ SOLN
INTRAMUSCULAR | Status: AC
  Filled 2023-06-29: qty 50

## 2023-06-29 MED ORDER — MULTIVITAMINS PO CAPS: 1.0000 | ORAL_CAPSULE | Freq: Every day | ORAL | Status: DC

## 2023-06-29 MED ORDER — CHLORHEXIDINE GLUCONATE CLOTH 2 % EX PADS
6.0000 | MEDICATED_PAD | Freq: Every day | CUTANEOUS | Status: DC
  Administered 2023-07-02 – 2023-07-03 (×2): 6 via TOPICAL

## 2023-06-29 MED ORDER — ACETAMINOPHEN 325 MG PO TABS
650.0000 mg | ORAL_TABLET | Freq: Four times a day (QID) | ORAL | Status: DC | PRN
  Administered 2023-06-29 – 2023-07-01 (×4): 650 mg via ORAL
  Filled 2023-06-29 (×4): qty 2

## 2023-06-29 MED ORDER — SODIUM CHLORIDE 0.9% FLUSH: 10.0000 mL | INTRAVENOUS | Status: DC | PRN

## 2023-06-29 MED ORDER — ENOXAPARIN SODIUM 40 MG/0.4ML IJ SOSY
40.0000 mg | PREFILLED_SYRINGE | Freq: Every day | INTRAMUSCULAR | Status: DC
  Administered 2023-06-29 – 2023-06-30 (×2): 40 mg via SUBCUTANEOUS
  Filled 2023-06-29 (×2): qty 0.4

## 2023-06-29 MED ORDER — IOHEXOL 350 MG/ML SOLN
75.0000 mL | Freq: Once | INTRAVENOUS | Status: AC | PRN
  Administered 2023-06-29: 75 mL via INTRAVENOUS

## 2023-06-29 MED ORDER — ONDANSETRON HCL 4 MG PO TABS: 4.0000 mg | ORAL_TABLET | Freq: Four times a day (QID) | ORAL | Status: DC | PRN

## 2023-06-29 MED ORDER — MORPHINE SULFATE (PF) 4 MG/ML IV SOLN
4.0000 mg | Freq: Once | INTRAVENOUS | Status: AC
  Administered 2023-06-29: 4 mg via INTRAVENOUS
  Filled 2023-06-29: qty 1

## 2023-06-29 MED ORDER — ONDANSETRON HCL 4 MG/2ML IJ SOLN: 4.0000 mg | Freq: Four times a day (QID) | INTRAMUSCULAR | Status: DC | PRN

## 2023-06-29 MED ORDER — ONDANSETRON HCL 4 MG/2ML IJ SOLN
4.0000 mg | Freq: Once | INTRAMUSCULAR | Status: AC
  Administered 2023-06-29: 4 mg via INTRAVENOUS
  Filled 2023-06-29: qty 2

## 2023-06-29 MED ORDER — ADULT MULTIVITAMIN W/MINERALS CH
1.0000 | ORAL_TABLET | Freq: Every day | ORAL | Status: DC
  Administered 2023-06-30 – 2023-07-03 (×4): 1 via ORAL
  Filled 2023-06-29 (×4): qty 1

## 2023-06-29 MED ORDER — IOHEXOL 300 MG/ML  SOLN
100.0000 mL | Freq: Once | INTRAMUSCULAR | Status: AC | PRN
  Administered 2023-06-29: 100 mL via INTRAVENOUS

## 2023-06-29 NOTE — ED Notes (Signed)
 Attempted to place additional PIV for CTA ordered unsuccessfully. IV team order placed to access port.

## 2023-06-29 NOTE — ED Notes (Signed)
 RN called to room d/t bedside monitor alarming. HR noted to be 120s-160s, fluctuating. MD paged.

## 2023-06-29 NOTE — ED Triage Notes (Signed)
 Pt states she has had 3 days of shob and fluid in her lungs. Chest x-ray this morning confirmed fluid in lungs, sent over from radiology.

## 2023-06-29 NOTE — H&P (Signed)
 History and Physical  Karlee E Avila WUJ:[id number] DOB: [birth year] DOA: 06/29/2023  PCP: Merri Brunette, MD   Chief Complaint: Pleural effusion, SOB  HPI: Maria Avila is a 72 y.o. female with medical history significant for endometrial cancer (stage IVb high-grade uterine serous carcinoma) s/p chemotherapy, surgery, radiation and currently on adjuvant immunotherapy, CVA, and HLD who presents from radiology for evaluation of shortness of breath and pleural effusion.  Patient reports she had her last chemotherapy on 2/14.  We, she has had fatigue, generalized weakness, shortness of breath and dyspnea on exertion. This morning her dyspnea exertion worsened to the point where she was getting out of breath just walking to the bathroom.  She had a scheduled CT scan in the outpatient and after the scan, she was advised to present to the ED for evaluation of for new pleural effusion. She endorsed some pain in her left flank but denies any abdominal pain, chest pain, nausea, vomiting, dysuria, fevers, chills or leg swelling.  She does report feeling dehydrated.  ED Course: Vitals overall stable, on 2 L [place] with SpO2 93 to 97%.  Labs show stable pancytopenia, normal CMP, normal BNP, normal troponin, negative COVID, RSV and flu test. EKG shows sinus rhythm. CXR shows moderate right pleural effusion with right lung base compressive atelectasis versus pneumonia.  CTA chest PE study with no evidence of PE but shows large right pleural effusion and significant right lung atelectasis. Patient received IV morphine 4 mg x 1 and IV Zofran 4 mg x 1. TRH was consulted for admission.  Review of Systems: Please see HPI for pertinent positives and negatives. A complete 10 system review of systems are otherwise negative.  Past Medical History:  Diagnosis Date   Colitis    Endometrial cancer [facility])    History of radiation therapy    Vagina-04/20/23-05/11/23- Dr. Antony Blackbird   Hyperlipidemia    Hypertension     Malnutrition (HCC)    Stroke [facility]) 2019   balance still affected   Past Surgical History:  Procedure Laterality Date   BIOPSY  02/26/2018   Procedure: BIOPSY;  Surgeon: Charlott Rakes, MD;  Location: [facility] ENDOSCOPY;  Service: Endoscopy;;   COLONOSCOPY WITH PROPOFOL N/A 02/26/2018   Procedure: COLONOSCOPY WITH PROPOFOL;  Surgeon: Charlott Rakes, MD;  Location: [facility] ENDOSCOPY;  Service: Endoscopy;  Laterality: N/A;   ESOPHAGOGASTRODUODENOSCOPY (EGD) WITH PROPOFOL N/A 02/26/2018   Procedure: ESOPHAGOGASTRODUODENOSCOPY (EGD) WITH PROPOFOL;  Surgeon: Charlott Rakes, MD;  Location: [facility] ENDOSCOPY;  Service: Endoscopy;  Laterality: N/A;   IR IMAGING GUIDED PORT INSERTION  10/21/2022   NO PAST SURGERIES     UNCERTAIN OF NAME OF SURGERY   Social History:  reports that she quit smoking about 31 years ago. Her smoking use included cigarettes. She has never used smokeless tobacco. She reports current alcohol use. She reports that she does not currently use drugs.  Allergies  Allergen Reactions   Crestor [Rosuvastatin Calcium] Swelling and Other (See Comments)    Swelling of legs and muscle weakness   Zocor [Simvastatin] Swelling and Other (See Comments)    Swelling of legs and muscle weakness    Family History  Problem Relation Age of Onset   Alzheimer's disease Mother    Colon cancer Father    Endometrial cancer Sister        gyn, unsure type   Breast cancer Neg Hx    Ovarian cancer Neg Hx    Prostate cancer Neg Hx    Pancreatic cancer Neg Hx  Prior to Admission medications   Medication Sig Start Date End Date Taking? Authorizing Provider  acetaminophen (TYLENOL) 325 MG tablet Take 1-2 tablets (325-650 mg total) by mouth every 4 (four) hours as needed for mild pain. 04/09/18   Love, Evlyn Kanner, PA-C  benazepril-hydrochlorthiazide (LOTENSIN HCT) 20-25 MG tablet Take 1 tablet by mouth daily. 11/22/19   [provider]  [employer] VITAMIN C 500 MG tablet TAKE 1 TABLET BY MOUTH TWICE A  DAY 05/26/18   Jones Bales, NP  ezetimibe (ZETIA) 10 MG tablet Take 10 mg by mouth daily. 10/06/19   [provider]  lidocaine-prilocaine (EMLA) cream Apply 1 Application topically daily as needed (prior to port access). 06/19/23   Artis Delay, MD  Multiple Vitamin (MULTIVITAMIN) capsule Take 1 capsule by mouth daily. 07/21/17   [provider]  ondansetron (ZOFRAN) 8 MG tablet Take 1 tablet (8 mg total) by mouth every 8 (eight) hours as needed for nausea. 10/23/22   Artis Delay, MD  oxyCODONE (OXY IR/ROXICODONE) 5 MG immediate release tablet Take 1 tablet (5 mg total) by mouth every 6 (six) hours as needed for severe pain (pain score 7-10). 06/19/23   Artis Delay, MD  prochlorperazine (COMPAZINE) 10 MG tablet Take 1 tablet (10 mg total) by mouth every 6 (six) hours as needed for nausea or vomiting. 10/23/22   Artis Delay, MD  thiamine (VITAMIN B-1) 100 MG tablet TAKE 1 TABLET BY MOUTH EVERY DAY 05/25/18   Kirsteins, Victorino Sparrow, MD    Physical Exam: BP 119/64   Pulse 61   Temp 98.2 F (36.8 C) (Oral)   Resp (!) 25   Ht 5\' 2"  (1.575 m)   Wt 82.5 kg   SpO2 93%   BMI 33.25 kg/m  General: Pleasant, well-appearing elderly woman laying in bed. No acute distress. HEENT: [place]/AT. Anicteric sclera.  Dry mucous membrane. Chest wall: Right chest port stable in place. Mild ttp of left chest wall and left lower ribs CV: Regular rate and rhythm. No murmurs, rubs, or gallops. No LE edema Pulmonary: On 2 L [place]. Lungs CTAB. Normal effort. Decreased breath sounds on the right side.  Abdominal: Soft, nontender, nondistended. Normal bowel sounds. Extremities: Palpable radial and DP pulses. Normal ROM. Skin: Warm and dry. No obvious rash or lesions.  Decreased skin turgor. Neuro: A&Ox3. Moves all extremities. Normal sensation to light touch. No focal deficit. Psych: Normal mood and affect          Labs on Admission:  Basic Metabolic Panel: Recent Labs  Lab 06/29/23 0955  NA 138  K 3.8   CL 103  CO2 26  GLUCOSE 98  BUN 12  CREATININE 0.50  CALCIUM 9.6   Liver Function Tests: Recent Labs  Lab 06/29/23 0955  AST 18  ALT 11  ALKPHOS 49  BILITOT 0.6  PROT 7.6  ALBUMIN 4.0   No results for input(s): "LIPASE", "AMYLASE" in the last 168 hours. No results for input(s): "AMMONIA" in the last 168 hours. CBC: Recent Labs  Lab 06/29/23 0955  WBC 4.6  HGB 10.8*  HCT 32.8*  MCV 104.1*  PLT 168   Cardiac Enzymes: No results for input(s): "CKTOTAL", "CKMB", "CKMBINDEX", "TROPONINI" in the last 168 hours. BNP (last 3 results) Recent Labs    06/29/23 0955  BNP 28.7    ProBNP (last 3 results) No results for input(s): "PROBNP" in the last 8760 hours.  CBG: No results for input(s): "GLUCAP" in the last 168 hours.  Radiological Exams  on Admission: DG Chest Port 1 View Result Date: 06/29/2023 CLINICAL DATA:  Shortness of breath. EXAM: PORTABLE CHEST 1 VIEW COMPARISON:  Chest radiograph dated 04/02/2018 and CT dated 04/03/2023. FINDINGS: Risser Port-A-Cath with tip in the region of the cavoatrial junction. Moderate right pleural effusion with right lung base compressive atelectasis versus pneumonia. The left lung is clear. No pneumothorax. Stable cardiac silhouette. No acute osseous pathology. IMPRESSION: Moderate right pleural effusion with right lung base compressive atelectasis versus pneumonia. Electronically Signed   By: Elgie Collard M.D.   On: 06/29/2023 10:47   Assessment/Plan Melina E Avila is a 73 y.o. 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  # Right pleural effusion Elderly patient with history of endometrial cancer now presenting progressive dyspnea on exertion shortness of  breath over the last week, found to have a large right pleural effusion on CT in the outpatient. Repeat imaging on admission confirms a large right fluid effusion with significant atelectasis of the right lung.  Patient with new O2 requirements of 2 L [place] but not in any significant respiratory distress. -IR consulted for therapeutic thoracentesis, appreciate assistance -Continue supplemental O2, wean as able -Incentive spirometer, flutter valve  # Endometrial cancer Stage IVb high-grade uterine serous carcinoma now status post chemotherapy, surgery, radiation currently on adjuvant immunotherapy with trastuzumab. Patient's recent imaging shows excellent response to therapy with no signs of residual disease. Recent echo also showed preserved ejection fraction. -Follow-up with oncology in the outpatient -Continue home oxycodone as needed for pain  # Hypomagnesemia Mag low at 1.5. -Replete with IV mag 2 g -Follow-up morning mag  # Hypertension Patient's BP slightly soft with SBP in the 100s to 110s in the setting of dehydration. Improving with IV fluids. -Hold home BP for now, resume as appropriate  # A-fib with RVR, resolved Patient with no history of A-fib found to have significantly elevated heart rate in the 120s to 150s after returning from CT. On evaluation, patient denies any dizziness, chest pain but reported persistent shortness of breath and dry mouth. She received IV NS 500 cc bolus with improvement in HR followed by second 500 cc bolus. HR has been stable in the 70s to 90s since then. Normal TSH alk phos, low mag at 1.5. -Continue to monitor on telemetry -Repeat EKG in the morning  # HLD # Hx of CVA Patient has statin intolerance. Previously on Zetia but has not been taking this for the last 30 days. -CTM  # Generalized weakness -PT/OT eval and treat  DVT prophylaxis: Lovenox     Code Status: Full Code  Consults called: IR  Family Communication: Discussed admission with  spouse at bedside  Severity of Illness: The appropriate patient status for this patient is OBSERVATION. Observation status is judged to be reasonable and necessary in order to provide the required intensity of service to ensure the patient's safety. The patient's presenting symptoms, physical exam findings, and initial radiographic and laboratory data in the context of their medical condition is felt to place them at decreased risk for further clinical deterioration. Furthermore, it is anticipated that the patient will be medically stable for discharge from the hospital within 2 midnights of admission.   Level of care:  Med telemetry  Steffanie Rainwater, MD 06/29/2023, 3:43 PM [facility] Pager: [phone] Isaiah 41:10   If 7PM-7AM, please contact night-coverage [email or web address] Password [email or web address]

## 2023-06-29 NOTE — ED Provider Notes (Addendum)
 [place] EMERGENCY DEPARTMENT AT [facility] Provider Note   CSN: [id number] Arrival date & time: 06/29/23  5284     History  Chief Complaint  Patient presents with   Shortness of Breath    Maria Avila is a 73 y.o. female.  Pt with hx uterine ca, remote prior hysterectomy, last chemo ~ 10 days ago, presents indicating has been feeling generally weak, fatigued, and sob/doe in past week. Indicates had outpatient ct scan today and was told fluid in chest and to go to ER. Denies new or worsening cough. Sharp pain to left lower chest/costal margin area in past three days, constant, at rest, non radiating, not pleuritic. No abd pain or nvd. No dysuria or gu c/o. No extremity pain or swelling. No fever or chills.   The history is provided by the patient, medical records and the spouse.  Shortness of Breath Associated symptoms: no abdominal pain, no cough, no diaphoresis, no fever, no headaches, no neck pain, no rash, no sore throat and no vomiting        Home Medications Prior to Admission medications   Medication Sig Start Date End Date Taking? Authorizing Provider  acetaminophen (TYLENOL) 325 MG tablet Take 1-2 tablets (325-650 mg total) by mouth every 4 (four) hours as needed for mild pain. 04/09/18   Love, Evlyn Kanner, PA-C  benazepril-hydrochlorthiazide (LOTENSIN HCT) 20-25 MG tablet Take 1 tablet by mouth daily. 11/22/19   [provider]  [employer] VITAMIN C 500 MG tablet TAKE 1 TABLET BY MOUTH TWICE A DAY 05/26/18   Jones Bales, NP  ezetimibe (ZETIA) 10 MG tablet Take 10 mg by mouth daily. 10/06/19   [provider]  lidocaine-prilocaine (EMLA) cream Apply 1 Application topically daily as needed (prior to port access). 06/19/23   Artis Delay, MD  Multiple Vitamin (MULTIVITAMIN) capsule Take 1 capsule by mouth daily. 07/21/17   [provider]  ondansetron (ZOFRAN) 8 MG tablet Take 1 tablet (8 mg total) by mouth every 8 (eight) hours as needed for  nausea. 10/23/22   Artis Delay, MD  oxyCODONE (OXY IR/ROXICODONE) 5 MG immediate release tablet Take 1 tablet (5 mg total) by mouth every 6 (six) hours as needed for severe pain (pain score 7-10). 06/19/23   Artis Delay, MD  prochlorperazine (COMPAZINE) 10 MG tablet Take 1 tablet (10 mg total) by mouth every 6 (six) hours as needed for nausea or vomiting. 10/23/22   Artis Delay, MD  thiamine (VITAMIN B-1) 100 MG tablet TAKE 1 TABLET BY MOUTH EVERY DAY 05/25/18   Kirsteins, Victorino Sparrow, MD      Allergies    Crestor [rosuvastatin calcium] and Zocor [simvastatin]    Review of Systems   Review of Systems  Constitutional:  Negative for chills, diaphoresis and fever.  HENT:  Negative for sore throat.   Eyes:  Negative for redness.  Respiratory:  Positive for shortness of breath. Negative for cough.   Cardiovascular:  Negative for palpitations and leg swelling.  Gastrointestinal:  Negative for abdominal pain, nausea and vomiting.  Genitourinary:  Negative for dysuria, flank pain and hematuria.  Musculoskeletal:  Negative for back pain and neck pain.  Skin:  Negative for rash.  Neurological:  Negative for headaches.    Physical Exam Updated Vital Signs BP 119/64   Pulse 61   Temp 98.2 F (36.8 C) (Oral)   Resp (!) 25   Ht 1.575 m (5\' 2" )   Wt 82.5 kg   SpO2 93%  BMI 33.25 kg/m  Physical Exam Vitals and nursing note reviewed.  Constitutional:      Appearance: Normal appearance. She is well-developed.  HENT:     Head: Atraumatic.     Nose: Nose normal.     Mouth/Throat:     Mouth: Mucous membranes are moist.  Eyes:     General: No scleral icterus.    Conjunctiva/sclera: Conjunctivae normal.  Neck:     Trachea: No tracheal deviation.  Cardiovascular:     Rate and Rhythm: Normal rate and regular rhythm.     Pulses: Normal pulses.     Heart sounds: Normal heart sounds. No murmur heard.    No friction rub. No gallop.  Pulmonary:     Effort: Pulmonary effort is normal. No  respiratory distress.     Comments: Decreased breaths sounds on right. Mild left chest wall tenderness. No sts. No skin lesions. Port site without sign of infection. Abdominal:     General: Bowel sounds are normal. There is no distension.     Palpations: Abdomen is soft.     Tenderness: There is no abdominal tenderness. There is no guarding.  Genitourinary:    Comments: No cva tenderness.  Musculoskeletal:        General: No swelling or tenderness.     Cervical back: Normal range of motion and neck supple. No rigidity. No muscular tenderness.     Right lower leg: No edema.     Left lower leg: No edema.  Skin:    General: Skin is warm and dry.     Findings: No rash.  Neurological:     Mental Status: She is alert.     Comments: Alert, speech normal.   Psychiatric:        Mood and Affect: Mood normal.     ED Results / Procedures / Treatments   Labs (all labs ordered are listed, but only abnormal results are displayed) Results for orders placed or performed during the hospital encounter of 06/29/23  CBC   Collection Time: 06/29/23  9:55 AM  Result Value Ref Range   WBC 4.6 4.0 - 10.5 K/uL   RBC 3.15 (L) 3.87 - 5.11 MIL/uL   Hemoglobin 10.8 (L) 12.0 - 15.0 g/dL   HCT 13.2 (L) 44.0 - 10.2 %   MCV 104.1 (H) 80.0 - 100.0 fL   MCH 34.3 (H) 26.0 - 34.0 pg   MCHC 32.9 30.0 - 36.0 g/dL   RDW 72.5 36.6 - 44.0 %   Platelets 168 150 - 400 K/uL   nRBC 0.0 0.0 - 0.2 %  Comprehensive metabolic panel   Collection Time: 06/29/23  9:55 AM  Result Value Ref Range   Sodium 138 135 - 145 mmol/L   Potassium 3.8 3.5 - 5.1 mmol/L   Chloride 103 98 - 111 mmol/L   CO2 26 22 - 32 mmol/L   Glucose, Bld 98 70 - 99 mg/dL   BUN 12 8 - 23 mg/dL   Creatinine, Ser 3.47 0.44 - 1.00 mg/dL   Calcium 9.6 8.9 - 42.5 mg/dL   Total Protein 7.6 6.5 - 8.1 g/dL   Albumin 4.0 3.5 - 5.0 g/dL   AST 18 15 - 41 U/L   ALT 11 0 - 44 U/L   Alkaline Phosphatase 49 38 - 126 U/L   Total Bilirubin 0.6 0.0 - 1.2 mg/dL    GFR, Estimated >95 >63 mL/min   Anion gap 9 5 - 15  Brain natriuretic peptide  Collection Time: 06/29/23  9:55 AM  Result Value Ref Range   B Natriuretic Peptide 28.7 0.0 - 100.0 pg/mL  Troponin I (High Sensitivity)   Collection Time: 06/29/23  9:55 AM  Result Value Ref Range   Troponin I (High Sensitivity) 3 <18 ng/L  Resp panel by RT-PCR (RSV, Flu A&B, Covid) Anterior Nasal Swab   Collection Time: 06/29/23  9:59 AM   Specimen: Anterior Nasal Swab  Result Value Ref Range   SARS Coronavirus 2 by RT PCR NEGATIVE NEGATIVE   Influenza A by PCR NEGATIVE NEGATIVE   Influenza B by PCR NEGATIVE NEGATIVE   Resp Syncytial Virus by PCR NEGATIVE NEGATIVE  Urinalysis, Routine w reflex microscopic -Urine, Clean Catch   Collection Time: 06/29/23  2:38 PM  Result Value Ref Range   Color, Urine YELLOW YELLOW   APPearance CLEAR CLEAR   Specific Gravity, Urine >1.046 (H) 1.005 - 1.030   pH 5.0 5.0 - 8.0   Glucose, UA NEGATIVE NEGATIVE mg/dL   Hgb urine dipstick NEGATIVE NEGATIVE   Bilirubin Urine NEGATIVE NEGATIVE   Ketones, ur NEGATIVE NEGATIVE mg/dL   Protein, ur NEGATIVE NEGATIVE mg/dL   Nitrite NEGATIVE NEGATIVE   Leukocytes,Ua NEGATIVE NEGATIVE      EKG EKG Interpretation Date/Time:  Monday June 29 2023 08:48:50 EST Ventricular Rate:  60 PR Interval:  186 QRS Duration:  83 QT Interval:  462 QTC Calculation: 462 R Axis:   28  Text Interpretation: Sinus rhythm Non-specific ST-t changes No significant change since last tracing Confirmed by Cathren Laine ([id number]) on 06/29/2023 11:13:10 AM  Radiology DG Chest Port 1 View Result Date: 06/29/2023 CLINICAL DATA:  Shortness of breath. EXAM: PORTABLE CHEST 1 VIEW COMPARISON:  Chest radiograph dated 04/02/2018 and CT dated 04/03/2023. FINDINGS: Risser Port-A-Cath with tip in the region of the cavoatrial junction. Moderate right pleural effusion with right lung base compressive atelectasis versus pneumonia. The left lung is clear. No  pneumothorax. Stable cardiac silhouette. No acute osseous pathology. IMPRESSION: Moderate right pleural effusion with right lung base compressive atelectasis versus pneumonia. Electronically Signed   By: Elgie Collard M.D.   On: 06/29/2023 10:47    Procedures Procedures    Medications Ordered in ED Medications  morphine (PF) 4 MG/ML injection 4 mg (4 mg Intravenous Given 06/29/23 1431)  ondansetron (ZOFRAN) injection 4 mg (4 mg Intravenous Given 06/29/23 1431)    ED Course/ Medical Decision Making/ A&P                                 Medical Decision Making Problems Addressed: Chronic anemia: chronic illness or injury Metastasis from cancer of uterus [facility]): chronic illness or injury with exacerbation, progression, or side effects of treatment that poses a threat to life or bodily functions Pleural effusion on right: acute illness or injury with systemic symptoms that poses a threat to life or bodily functions SOB (shortness of breath): acute illness or injury with systemic symptoms that poses a threat to life or bodily functions  Amount and/or Complexity of Data Reviewed Independent Historian: spouse    Details: hx External Data Reviewed: notes. Labs: ordered. Decision-making details documented in ED Course. Radiology: ordered and independent interpretation performed. Decision-making details documented in ED Course. ECG/medicine tests: ordered and independent interpretation performed. Decision-making details documented in ED Course. Discussion of management or test interpretation with external provider(s): medicine  Risk Prescription drug management. Decision regarding hospitalization.   Iv ns.  Continuous pulse ox and cardiac monitoring. Labs ordered/sent. Imaging ordered.   Differential diagnosis includes pleural effusion, pna, chf, etc. Dispo decision including potential need for admission considered - will get labs and imaging and reassess.   Reviewed nursing notes and  prior charts for additional history. External reports reviewed. Additional history from: family/spouse.  Room air sats 88%,  2 liters sats 95%.    Cardiac monitor: sinus rhythm, rate 70.  Labs reviewed/interpreted by me - wbc normal. Hgb 11. Chem normal. Bnp normal. Trop normal.   Xrays reviewed/interpreted by me - large right pleural effusion.   CT reviewed/interpreted by me - pnd.   Given dyspnea, large effusion, etc. - will consult hospitalists for admission. Will admit. Ct chest remains pending.              Final Clinical Impression(s) / ED Diagnoses Final diagnoses:  Metastasis from cancer of uterus (HCC)  Pleural effusion on right  Chronic anemia  SOB (shortness of breath)    Rx / DC Orders ED Discharge Orders     None            Cathren Laine, MD 06/29/23 1544

## 2023-06-30 ENCOUNTER — Observation Stay (HOSPITAL_COMMUNITY): Payer: Medicare Other

## 2023-06-30 ENCOUNTER — Other Ambulatory Visit: Payer: Self-pay

## 2023-06-30 DIAGNOSIS — R0602 Shortness of breath: Secondary | ICD-10-CM

## 2023-06-30 DIAGNOSIS — Z48813 Encounter for surgical aftercare following surgery on the respiratory system: Secondary | ICD-10-CM | POA: Diagnosis not present

## 2023-06-30 DIAGNOSIS — J90 Pleural effusion, not elsewhere classified: Secondary | ICD-10-CM | POA: Diagnosis not present

## 2023-06-30 LAB — BASIC METABOLIC PANEL
Anion gap: 9 (ref 5–15)
BUN: 9 mg/dL (ref 8–23)
CO2: 25 mmol/L (ref 22–32)
Calcium: 9 mg/dL (ref 8.9–10.3)
Chloride: 104 mmol/L (ref 98–111)
Creatinine, Ser: 0.63 mg/dL (ref 0.44–1.00)
GFR, Estimated: >60 mL/min (ref >60)
Glucose, Bld: 96 mg/dL (ref 70–99)
Potassium: 3.3 mmol/L — ABNORMAL LOW (ref 3.5–5.1)
Sodium: 138 mmol/L (ref 135–145)

## 2023-06-30 LAB — CBC
HCT: 30.6 % — ABNORMAL LOW (ref 36.0–46.0)
Hemoglobin: 10.2 g/dL — ABNORMAL LOW (ref 12.0–15.0)
MCH: 34.8 pg — ABNORMAL HIGH (ref 26.0–34.0)
MCHC: 33.3 g/dL (ref 30.0–36.0)
MCV: 104.4 fL — ABNORMAL HIGH (ref 80.0–100.0)
Platelets: 153 10*3/uL (ref 150–400)
RBC: 2.93 MIL/uL — ABNORMAL LOW (ref 3.87–5.11)
RDW: 12.7 % (ref 11.5–15.5)
WBC: 4.2 10*3/uL (ref 4.0–10.5)
nRBC: 0 % (ref 0.0–0.2)

## 2023-06-30 LAB — MAGNESIUM: Magnesium: 2.1 mg/dL (ref 1.7–2.4)

## 2023-06-30 MED ORDER — SENNOSIDES-DOCUSATE SODIUM 8.6-50 MG PO TABS: 1.0000 | ORAL_TABLET | Freq: Two times a day (BID) | ORAL | Status: DC | PRN

## 2023-06-30 MED ORDER — THIAMINE MONONITRATE 100 MG PO TABS
100.0000 mg | ORAL_TABLET | Freq: Every day | ORAL | Status: DC
  Administered 2023-06-30 – 2023-07-03 (×4): 100 mg via ORAL
  Filled 2023-06-30 (×4): qty 1

## 2023-06-30 MED ORDER — ASPIRIN 81 MG PO TBEC
81.0000 mg | DELAYED_RELEASE_TABLET | Freq: Every day | ORAL | Status: DC
Start: 2023-06-30 — End: 2023-07-01
  Administered 2023-06-30 – 2023-07-01 (×2): 81 mg via ORAL
  Filled 2023-06-30 (×2): qty 1

## 2023-06-30 MED ORDER — LIDOCAINE HCL 1 % IJ SOLN
INTRAMUSCULAR | Status: AC
  Filled 2023-06-30: qty 20

## 2023-06-30 MED ORDER — LIDOCAINE-PRILOCAINE 2.5-2.5 % EX CREA: 1.0000 | TOPICAL_CREAM | Freq: Every day | CUTANEOUS | Status: DC | PRN

## 2023-06-30 NOTE — Care Management Obs Status (Signed)
 MEDICARE OBSERVATION STATUS NOTIFICATION   Patient Details  Name: Millissa E [place] MRN: [id number] Date of Birth: 12-Jun-1950   Medicare Observation Status Notification Given:  Yes    Beckie Busing, RN 06/30/2023, 3:46 PM

## 2023-06-30 NOTE — Hospital Course (Addendum)
 73 y.o. 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 She had a scheduled CT scan in the outpatient and after the scan, she was advised to present to the ED 2/24. ED Course: Vitals stable, on 2 L [place] with SpO2 93 to 97%.  Labs show stable pancytopenia, normal CMP, normal BNP, normal troponin, negative COVID, RSV and flu test. EKG>sinus rhythm. CXR>moderate right pleural effusion with right lung base compressive atelectasis versus pneumonia. CTA chest PE study> with no evidence of PE but shows large right pleural effusion and significant right lung atelectasis. Patient received IV morphine 4 mg x 1 and IV Zofran 4 mg x 1 and admitted for further management

## 2023-06-30 NOTE — Evaluation (Signed)
 Physical Therapy Evaluation Patient Details Name: Maria Avila MRN: [id number] DOB: 06-01-1950 Today's Date: 06/30/2023  History of Present Illness  Patient is a 73 year old female who presented on 2/24 with shortness of breath. Patient had outpatient CT scan revealing fluid in chest resulting in being sent to ER. s/p thoracentesis 06/30/23.  PMH: metastasis from cancer of uterus, chronic anemia,  Clinical Impression  Pt admitted with above diagnosis. Pt ambulated 18' with straight cane with mild unsteadiness that appeared to be related to 10/10 R lateral chest pain. Nurse notified of pt's request for pain medication.  Pt currently with functional limitations due to the deficits listed below (see PT Problem List). Pt will benefit from acute skilled PT to increase their independence and safety with mobility to allow discharge.           If plan is discharge home, recommend the following: A little help with walking and/or transfers;A little help with bathing/dressing/bathroom;Assistance with cooking/housework;Assist for transportation;Help with stairs or ramp for entrance   Can travel by private vehicle        Equipment Recommendations Rolling walker (2 wheels)  Recommendations for Other Services       Functional Status Assessment Patient has had a recent decline in their functional status and demonstrates the ability to make significant improvements in function in a reasonable and predictable amount of time.     Precautions / Restrictions Precautions Precautions: Fall Precaution/Restrictions Comments: denies falls in past 6 months Restrictions Weight Bearing Restrictions Per Provider Order: No      Mobility  Bed Mobility Overal bed mobility: Needs Assistance Bed Mobility: Supine to Sit     Supine to sit: Supervision     General bed mobility comments: increased time 2* increased pain R lateral chest with movement    Transfers Overall transfer level: Needs  assistance Equipment used: Rolling walker (2 wheels) Transfers: Sit to/from Stand Sit to Stand: Contact guard assist                Ambulation/Gait Ambulation/Gait assistance: Contact guard assist Gait Distance (Feet): 65 Feet Assistive device: Straight cane Gait Pattern/deviations: Step-through pattern, Decreased stride length Gait velocity: decr     General Gait Details: distance limited by pain, pulse oximeter did not give a reading during ambulation, mild unsteadiness with pt reaching for handrail intermittently  Stairs            Wheelchair Mobility     Tilt Bed    Modified Rankin (Stroke Patients Only)       Balance Overall balance assessment: Mild deficits observed, not formally tested                                           Pertinent Vitals/Pain Pain Assessment Pain Score: 10-Worst pain ever Pain Location: R side of lateral chest Pain Descriptors / Indicators: Grimacing, Discomfort, Sharp Pain Intervention(s): Limited activity within patient's tolerance, Monitored during session, Patient requesting pain meds-RN notified, Repositioned    Home Living Family/patient expects to be discharged to:: Private residence Living Arrangements: Spouse/significant other Available Help at Discharge: Family;Available 24 hours/day Type of Home: House Home Access: Stairs to enter Entrance Stairs-Rails: Can reach both Entrance Stairs-Number of Steps: 3-4   Home Layout: One level Home Equipment: Cane - single point      Prior Function Prior Level of Function : Independent/Modified Independent  Mobility Comments: walks with SPC ADLs Comments: independent     Extremity/Trunk Assessment   Upper Extremity Assessment Upper Extremity Assessment: Defer to OT evaluation    Lower Extremity Assessment Lower Extremity Assessment: Overall WFL for tasks assessed    Cervical / Trunk Assessment Cervical / Trunk Assessment:  Normal  Communication   Communication Communication: No apparent difficulties    Cognition Arousal: Alert Behavior During Therapy: WFL for tasks assessed/performed                                     Cueing       General Comments      Exercises     Assessment/Plan    PT Assessment Patient needs continued PT services  PT Problem List Decreased mobility;Decreased activity tolerance       PT Treatment Interventions Gait training;Therapeutic activities;Functional mobility training;Therapeutic exercise;Patient/family education    PT Goals (Current goals can be found in the Care Plan section)  Acute Rehab PT Goals Patient Stated Goal: decrease pain PT Goal Formulation: With patient Time For Goal Achievement: 07/14/23 Potential to Achieve Goals: Good    Frequency Min 1X/week     Co-evaluation               AM-PAC PT "6 Clicks" Mobility  Outcome Measure Help needed turning from your back to your side while in a flat bed without using bedrails?: A Little Help needed moving from lying on your back to sitting on the side of a flat bed without using bedrails?: A Little Help needed moving to and from a bed to a chair (including a wheelchair)?: A Little Help needed standing up from a chair using your arms (e.g., wheelchair or bedside chair)?: A Little Help needed to walk in hospital room?: A Little Help needed climbing 3-5 steps with a railing? : A Little 6 Click Score: 18    End of Session Equipment Utilized During Treatment: Gait belt Activity Tolerance: Patient limited by pain Patient left: in bed;with call bell/phone within reach;with nursing/sitter in room Nurse Communication: Mobility status PT Visit Diagnosis: Difficulty in walking, not elsewhere classified (R26.2);Pain;Unsteadiness on feet (R26.81)    Time: 1610-9604 PT Time Calculation (min) (ACUTE ONLY): 11 min   Charges:   PT Evaluation $PT Eval Moderate Complexity: 1 Mod   PT  General Charges $$ ACUTE PT VISIT: 1 Visit        Tamala Ser PT 06/30/2023  Acute Rehabilitation Services  Office [phone]

## 2023-06-30 NOTE — Evaluation (Addendum)
 Occupational Therapy Evaluation Patient Details Name: Maria Avila MRN: [id number] DOB: 1951-03-24 Today's Date: 06/30/2023   History of Present Illness   Patient is a 73 year old female who presented on 2/24 with shortness of breath. Patient had outpatient CT scan revealing fluid in chest resulting in being sent to ER. PMH: metastasis from cancer of uterus, chronic anemia,     Clinical Impressions Patient is a 73 year old female who was admitted for above. Patient was living at home with husband at cane level with independence in ADLs. Currently, patient was CGA for mobility in room with cane with increased time and patient reporting pain 8/10 on L side. Nurse made aware. Patient is pleasant and motivated to get back home when medically stable. OT to continue to follow and increase independence in Adls at cane level.      If plan is discharge home, recommend the following:   A little help with walking and/or transfers;A little help with bathing/dressing/bathroom;Assistance with cooking/housework;Direct supervision/assist for medications management;Assist for transportation;Help with stairs or ramp for entrance;Direct supervision/assist for financial management     Functional Status Assessment   Patient has had a recent decline in their functional status and demonstrates the ability to make significant improvements in function in a reasonable and predictable amount of time.     Equipment Recommendations   None recommended by OT      Precautions/Restrictions   Precautions Precautions: Fall Restrictions Weight Bearing Restrictions Per Provider Order: No     Mobility Bed Mobility Overal bed mobility: Needs Assistance Bed Mobility: Supine to Sit     Supine to sit: Supervision                      Balance Overall balance assessment: Mild deficits observed, not formally tested               ADL either performed or assessed with clinical judgement    ADL Overall ADL's : Needs assistance/impaired Eating/Feeding: Sitting;Set up   Grooming: Set up;Sitting   Upper Body Bathing: Sitting;Minimal assistance   Lower Body Bathing: Minimal assistance;Sit to/from stand Lower Body Bathing Details (indicate cue type and reason): able to figure four BLE simulated Upper Body Dressing : Sitting;Minimal assistance   Lower Body Dressing: Minimal assistance;Sitting/lateral leans   Toilet Transfer: Minimal assistance;Rolling walker (2 wheels);Ambulation   Toileting- Clothing Manipulation and Hygiene: Sit to/from stand;Minimal assistance               Vision Baseline Vision/History: 1 Wears glasses              Pertinent Vitals/Pain Pain Assessment Pain Assessment: 0-10 Pain Score: 8  Pain Location: L side of trunk Pain Descriptors / Indicators: Grimacing, Discomfort, Constant Pain Intervention(s): Limited activity within patient's tolerance, Monitored during session, Patient requesting pain meds-RN notified     Extremity/Trunk Assessment Upper Extremity Assessment Upper Extremity Assessment: Overall WFL for tasks assessed   Lower Extremity Assessment Lower Extremity Assessment: Defer to PT evaluation   Cervical / Trunk Assessment Cervical / Trunk Assessment: Normal      Cognition Arousal: Alert Behavior During Therapy: WFL for tasks assessed/performed Cognition: No apparent impairments                            Home Living Family/patient expects to be discharged to:: Private residence Living Arrangements: Spouse/significant other Available Help at Discharge: Family;Available 24 hours/day Type of Home: House Home Access:  Stairs to enter [employer] of Steps: 3-4 Entrance Stairs-Rails: Can reach both Home Layout: One level     Bathroom Shower/Tub: Walk-in shower         Home Equipment: Gilmer Mor - single point          Prior Functioning/Environment Prior Level of Function :  Independent/Modified Independent                    OT Problem List: Decreased activity tolerance;Impaired balance (sitting and/or standing);Decreased safety awareness;Decreased knowledge of precautions;Decreased knowledge of use of DME or AE   OT Treatment/Interventions: Self-care/ADL training;DME and/or AE instruction;Therapeutic activities;Balance training;Therapeutic exercise      OT Goals(Current goals can be found in the care plan section)       OT Frequency:  Min 1X/week       AM-PAC OT "6 Clicks" Daily Activity     Outcome Measure Help from another person eating meals?: None Help from another person taking care of personal grooming?: A Little Help from another person toileting, which includes using toliet, bedpan, or urinal?: A Lot Help from another person bathing (including washing, rinsing, drying)?: A Lot Help from another person to put on and taking off regular upper body clothing?: A Little Help from another person to put on and taking off regular lower body clothing?: A Lot 6 Click Score: 16   End of Session Equipment Utilized During Treatment: Gait belt;Rolling walker (2 wheels) Nurse Communication: Mobility status  Activity Tolerance: Patient tolerated treatment well Patient left: in bed;with call bell/phone within reach (in ED)  OT Visit Diagnosis: Unsteadiness on feet (R26.81);Other abnormalities of gait and mobility (R26.89);Muscle weakness (generalized) (M62.81)                Time: 4098-1191 OT Time Calculation (min): 14 min Charges:  OT General Charges $OT Visit: 1 Visit OT Evaluation $OT Eval Low Complexity: 1 Low  Sander Remedios OTR/L, MS Acute Rehabilitation Department Office# [phone]   Selinda Flavin 06/30/2023, 12:55 PM

## 2023-06-30 NOTE — Procedures (Signed)
 PROCEDURE SUMMARY:  Successful US guided right thoracentesis. Yielded 1.1 L of clear amber fluid. Patient tolerated procedure well. No immediate complications. EBL = trace  Post procedure chest X-ray reveals no pneumothorax  Loman Brooklyn PA-C 06/30/2023 10:56 AM

## 2023-06-30 NOTE — Progress Notes (Signed)
 PROGRESS NOTE Maria Avila  ZOX:[id number] DOB: 1951/01/18 DOA: 06/29/2023 PCP: Merri Brunette, MD  Brief Narrative/Hospital Course: 73 y.o. 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 She had a scheduled CT scan in the outpatient and after the scan, she was advised to present to the ED 2/24. ED Course: Vitals stable, on 2 L [place] with SpO2 93 to 97%.  Labs show stable pancytopenia, normal CMP, normal BNP, normal troponin, negative COVID, RSV and flu test. EKG>sinus rhythm. CXR>moderate right pleural effusion with right lung base compressive atelectasis versus pneumonia. CTA chest PE study> with no evidence of PE but shows large right pleural effusion and significant right lung atelectasis. Patient received IV morphine 4 mg x 1 and IV Zofran 4 mg x 1 and admitted for further management      Subjective: Patient seen and examined this morning she is resting comfortably some pain on the left lateral chest wall Resting comfortably on the oxygen   Assessment and Plan: Principal Problem:   Pleural effusion, right Active Problems:   Chronic anemia   Metastasis from cancer of uterus (HCC)   SOB (shortness of breath)   Hypomagnesemia   Assessment and Plan: No notes have been filed under this hospital service. Service: Hospitalist  Symptomatic large right pleural effusion: Patient with shortness of breath dyspnea on exertion fatigue weakness.  Patient with stage IV endometrial carcinoma status post chemo and radiation-IR consulted pleural fluid analysis and cytology.  Recent echo showed preserved EF.  Endometrial cancer Stage IVb high-grade uterine serous carcinoma now status post chemotherapy, surgery,  radiation currently on adjuvant immunotherapy with trastuzumab. Patient's recent imaging shows excellent response to therapy with no signs of residual disease. Recent echo also showed preserved ejection fraction. She will continue to follow-up with [place] continue to follow-up with oncology as outpatient continue oxycodone, Toradol for pain   Hypomagnesemia Hypokalemia: Mag improved k low-replace  Hypertension: Soft bp, Holding home meds   A-fib with RVR, resolved: elevated heart rate in the 120s to 150s after returning from CT s/p ovf bolus, and HR improved. ?NEW OSNET- Check TSH  This SmartLink has not been configured with any valid records.   Tte in jan 2025. Will ask cardio to weigh in for Anti-coagualtion. Repeat EKG    HLD Hx of CVA: Has statin intolerance, previously on GTI.   Generalized weakness -PT/OT eval and treat  Class I Obesity: Patient's Body mass index is 33.25 kg/m. : Will benefit with PCP follow-up, weight loss  healthy lifestyle and outpatient sleep evaluation.   DVT prophylaxis: enoxaparin (LOVENOX) injection 40 mg Start: 06/29/23 2200 Code Status:   Code Status: Full Code Family Communication: plan of care discussed with patien at bedside. Patient status is: Remains hospitalized because of severity of illness Level of care: Telemetry   Dispo: The patient is from: HOME W/ HUSBAND            Anticipated disposition: TBD Objective: Vitals last 24 hrs: Vitals:   06/30/23 0430 06/30/23 0530 06/30/23 0600 06/30/23 0630  BP: 118/69 100/66 109/77 108/72  Pulse: (!) 58 (!) 57 (!) 55 (!) 56  Resp:    17  Temp:      TempSrc:      SpO2: 99% 100% 100% 100%  Weight:  Height:       Weight change:   Physical Examination: General exam: alert awake,at baseline, older than stated age HEENT:Oral mucosa moist, Ear/Nose WNL grossly Respiratory system: Bilaterally diminished breath sound ,no use of accessory muscle Cardiovascular system: S1 & S2 +, No  JVD. Gastrointestinal system: Abdomen soft,NT,ND, BS+ Nervous System: Alert, awake, moving all extremities,and following commands. Extremities: LE edema neg,distal peripheral pulses palpable and warm.  Skin: No rashes,no icterus. MSK: Normal muscle bulk,tone, power   Medications reviewed:  Scheduled Meds:  Chlorhexidine Gluconate Cloth  6 each Topical Daily   enoxaparin (LOVENOX) injection  40 mg Subcutaneous QHS   multivitamin with minerals  1 tablet Oral Daily  Continuous Infusions:   Diet Order             Diet regular Room service appropriate? Yes; Fluid consistency: Thin  Diet effective now                  Intake/Output Summary (Last 24 hours) at 06/30/2023 0730 Last data filed at 06/30/2023 0256 Gross per 24 hour  Intake 1050 ml  Output --  Net 1050 ml   Net IO Since Admission: 1,050 mL [06/30/23 0730]  Wt Readings from Last 3 Encounters:  06/29/23 82.5 kg  06/19/23 82.5 kg  06/11/23 81.9 kg     Unresulted Labs (From admission, onward)    None     Data Reviewed: I have personally reviewed following labs and imaging studies CBC: Recent Labs  Lab 06/29/23 0955 06/30/23 0417  WBC 4.6 4.2  HGB 10.8* 10.2*  HCT 32.8* 30.6*  MCV 104.1* 104.4*  PLT 168 153   Basic Metabolic Panel:  Recent Labs  Lab 06/29/23 0955 06/29/23 2015 06/30/23 0417  NA 138  --  138  K 3.8  --  3.3*  CL 103  --  104  CO2 26  --  25  GLUCOSE 98  --  96  BUN 12  --  9  CREATININE 0.50  --  0.63  CALCIUM 9.6  --  9.0  MG  --  1.5* 2.1  PHOS  --  3.1  --    GFR: Estimated Creatinine Clearance: 62.4 mL/min (by C-G formula based on SCr of 0.63 mg/dL). Liver Function Tests:  Recent Labs  Lab 06/29/23 0955  AST 18  ALT 11  ALKPHOS 49  BILITOT 0.6  PROT 7.6  ALBUMIN 4.0   Recent Labs    06/29/23 2015  TSH 1.606  Sepsis Labs: No results for input(s): "PROCALCITON", "LATICACIDVEN" in the last 168 hours. Recent Results (from the past 240 hours)  Resp panel by RT-PCR  (RSV, Flu A&B, Covid) Anterior Nasal Swab     Status: None   Collection Time: 06/29/23  9:59 AM   Specimen: Anterior Nasal Swab  Result Value Ref Range Status   SARS Coronavirus 2 by RT PCR NEGATIVE NEGATIVE Final    Comment: (NOTE) SARS-CoV-2 target nucleic acids are NOT DETECTED.  The SARS-CoV-2 RNA is generally detectable in upper respiratory specimens during the acute phase of infection. The lowest concentration of SARS-CoV-2 viral copies this assay can detect is 138 copies/mL. A negative result does not preclude SARS-Cov-2 infection and should not be used as the sole basis for treatment or other patient management decisions. A negative result may occur with  improper specimen collection/handling, submission of specimen other than nasopharyngeal swab, presence of viral mutation(s) within the areas targeted by this assay, and inadequate number of viral copies(<138 copies/mL). A  negative result must be combined with clinical observations, patient history, and epidemiological information. The expected result is Negative.  Fact Sheet for Patients:  BloggerCourse.com  Fact Sheet for Healthcare Providers:  SeriousBroker.it  This test is no t yet approved or cleared by the [place] FDA and  has been authorized for detection and/or diagnosis of SARS-CoV-2 by FDA under an Emergency Use Authorization (EUA). This EUA will remain  in effect (meaning this test can be used) for the duration of the COVID-19 declaration under Section 564(b)(1) of the Act, 21 U.S.C.section 360bbb-3(b)(1), unless the authorization is terminated  or revoked sooner.       Influenza A by PCR NEGATIVE NEGATIVE Final   Influenza B by PCR NEGATIVE NEGATIVE Final    Comment: (NOTE) The Xpert Xpress SARS-CoV-2/FLU/RSV plus assay is intended as an aid in the diagnosis of influenza from Nasopharyngeal swab specimens and should not be used as a sole basis for  treatment. Nasal washings and aspirates are unacceptable for Xpert Xpress SARS-CoV-2/FLU/RSV testing.  Fact Sheet for Patients: BloggerCourse.com  Fact Sheet for Healthcare Providers: SeriousBroker.it  This test is not yet approved or cleared by the [place] FDA and has been authorized for detection and/or diagnosis of SARS-CoV-2 by FDA under an Emergency Use Authorization (EUA). This EUA will remain in effect (meaning this test can be used) for the duration of the COVID-19 declaration under Section 564(b)(1) of the Act, 21 U.S.C. section 360bbb-3(b)(1), unless the authorization is terminated or revoked.     Resp Syncytial Virus by PCR NEGATIVE NEGATIVE Final    Comment: (NOTE) Fact Sheet for Patients: BloggerCourse.com  Fact Sheet for Healthcare Providers: SeriousBroker.it  This test is not yet approved or cleared by the [place] FDA and has been authorized for detection and/or diagnosis of SARS-CoV-2 by FDA under an Emergency Use Authorization (EUA). This EUA will remain in effect (meaning this test can be used) for the duration of the COVID-19 declaration under Section 564(b)(1) of the Act, 21 U.S.C. section 360bbb-3(b)(1), unless the authorization is terminated or revoked.  Performed at [facility], [address], [place], [place] [address]   Antimicrobials/Microbiology: Anti-infectives (From admission, onward)    None         Component Value Date/Time   SDES  04/09/2023 1052    URINE, CLEAN CATCH Performed at [facility], [address] [place], [place] [address]    SPECREQUEST  04/09/2023 1052    NONE Performed at [facility], [address], [place], [place] [address]    CULT 50,000 COLONIES/mL ESCHERICHIA COLI (A) 04/09/2023 1052   REPTSTATUS 04/11/2023 FINAL 04/09/2023 1052      Radiology Studies: CT Angio Chest PE W/Cm &/Or Wo Cm Result Date: 06/29/2023 CLINICAL DATA:  History of endometrial cancer, weakness, fatigue, dyspnea on exertion EXAM: CT ANGIOGRAPHY CHEST WITH CONTRAST TECHNIQUE: Multidetector CT imaging of the chest was performed using the standard protocol during bolus administration of intravenous contrast. Multiplanar CT image reconstructions and MIPs were obtained to evaluate the vascular anatomy. RADIATION DOSE REDUCTION: This exam was performed according to the departmental dose-optimization program which includes automated exposure control, adjustment of the mA and/or kV according to patient size and/or use of iterative reconstruction technique. CONTRAST:  75mL OMNIPAQUE IOHEXOL 350 MG/ML SOLN COMPARISON:  04/03/2023, 06/29/2023 FINDINGS: Cardiovascular: This is a technically adequate evaluation of the pulmonary vasculature. No filling defects or pulmonary emboli. The heart is unremarkable without pericardial effusion. No evidence of  thoracic aortic aneurysm or dissection. Atherosclerosis of the aorta and coronary vasculature. Stable right chest wall port via internal jugular approach, tip within the superior vena cava at the atriocaval junction. Mediastinum/Nodes: No enlarged mediastinal, hilar, or axillary lymph nodes. Thyroid gland, trachea, and esophagus demonstrate no significant findings. Lungs/Pleura: There is a large right pleural effusion, volume estimated in excess of 2 L. There is dense consolidation and volume loss within the right middle and right lower lobes consistent with atelectasis. Minimal dependent atelectasis within the right upper lobe. Left lung is clear. No pneumothorax. Central airways are patent. Upper Abdomen: No acute abnormality. Musculoskeletal: No acute or destructive bony abnormalities. Reconstructed images demonstrate no additional findings. Review of the MIP images confirms the above findings. IMPRESSION: 1. No evidence of  pulmonary embolus. 2. Large right pleural effusion volume estimated in excess of 2 L. 3. Significant right lung atelectasis, most pronounced within the right middle and right lower lobes. 4. Aortic Atherosclerosis (ICD10-I70.0). Coronary artery atherosclerosis. Electronically Signed   By: Sharlet Salina M.D.   On: 06/29/2023 19:24   DG Chest Port 1 View Result Date: 06/29/2023 CLINICAL DATA:  Shortness of breath. EXAM: PORTABLE CHEST 1 VIEW COMPARISON:  Chest radiograph dated 04/02/2018 and CT dated 04/03/2023. FINDINGS: Risser Port-A-Cath with tip in the region of the cavoatrial junction. Moderate right pleural effusion with right lung base compressive atelectasis versus pneumonia. The left lung is clear. No pneumothorax. Stable cardiac silhouette. No acute osseous pathology. IMPRESSION: Moderate right pleural effusion with right lung base compressive atelectasis versus pneumonia. Electronically Signed   By: Elgie Collard M.D.   On: 06/29/2023 10:47   LOS: 0 days  Total time spent in review of labs and imaging, patient evaluation, formulation of plan, documentation and communication with family: 35 minutes  Lanae Boast, MD  [facility]  06/30/2023, 7:30 AM

## 2023-07-01 ENCOUNTER — Telehealth (HOSPITAL_COMMUNITY): Payer: Self-pay | Admitting: Pharmacy Technician

## 2023-07-01 ENCOUNTER — Other Ambulatory Visit: Payer: Self-pay | Admitting: Cardiology

## 2023-07-01 ENCOUNTER — Other Ambulatory Visit (HOSPITAL_COMMUNITY): Payer: Self-pay

## 2023-07-01 ENCOUNTER — Observation Stay: Payer: Medicare Other

## 2023-07-01 ENCOUNTER — Encounter: Payer: Self-pay | Admitting: *Deleted

## 2023-07-01 DIAGNOSIS — I480 Paroxysmal atrial fibrillation: Secondary | ICD-10-CM

## 2023-07-01 DIAGNOSIS — J90 Pleural effusion, not elsewhere classified: Secondary | ICD-10-CM | POA: Diagnosis not present

## 2023-07-01 LAB — BASIC METABOLIC PANEL
Anion gap: 11 (ref 5–15)
BUN: 14 mg/dL (ref 8–23)
CO2: 26 mmol/L (ref 22–32)
Calcium: 9.5 mg/dL (ref 8.9–10.3)
Chloride: 102 mmol/L (ref 98–111)
Creatinine, Ser: 0.79 mg/dL (ref 0.44–1.00)
GFR, Estimated: >60 mL/min (ref >60)
Glucose, Bld: 120 mg/dL — ABNORMAL HIGH (ref 70–99)
Potassium: 3.4 mmol/L — ABNORMAL LOW (ref 3.5–5.1)
Sodium: 139 mmol/L (ref 135–145)

## 2023-07-01 LAB — CBC WITH DIFFERENTIAL/PLATELET
Abs Immature Granulocytes: 0.01 10*3/uL (ref 0.00–0.07)
Basophils Absolute: 0 10*3/uL (ref 0.0–0.1)
Basophils Relative: 0 %
Eosinophils Absolute: 0.1 10*3/uL (ref 0.0–0.5)
Eosinophils Relative: 1 %
HCT: 32.1 % — ABNORMAL LOW (ref 36.0–46.0)
Hemoglobin: 10.6 g/dL — ABNORMAL LOW (ref 12.0–15.0)
Immature Granulocytes: 0 %
Lymphocytes Relative: 35 %
Lymphs Abs: 1.7 10*3/uL (ref 0.7–4.0)
MCH: 34.6 pg — ABNORMAL HIGH (ref 26.0–34.0)
MCHC: 33 g/dL (ref 30.0–36.0)
MCV: 104.9 fL — ABNORMAL HIGH (ref 80.0–100.0)
Monocytes Absolute: 0.3 10*3/uL (ref 0.1–1.0)
Monocytes Relative: 7 %
Neutro Abs: 2.7 10*3/uL (ref 1.7–7.7)
Neutrophils Relative %: 57 %
Platelets: 163 10*3/uL (ref 150–400)
RBC: 3.06 MIL/uL — ABNORMAL LOW (ref 3.87–5.11)
RDW: 12.5 % (ref 11.5–15.5)
WBC: 4.7 10*3/uL (ref 4.0–10.5)
nRBC: 0 % (ref 0.0–0.2)

## 2023-07-01 LAB — PROTEIN, TOTAL: Total Protein: 7 g/dL (ref 6.5–8.1)

## 2023-07-01 LAB — PROTIME-INR
INR: 1 (ref 0.8–1.2)
Prothrombin Time: 13.7 s (ref 11.4–15.2)

## 2023-07-01 LAB — LACTATE DEHYDROGENASE: LDH: 128 U/L (ref 98–192)

## 2023-07-01 LAB — GLUCOSE, CAPILLARY: Glucose-Capillary: 118 mg/dL — ABNORMAL HIGH (ref 70–99)

## 2023-07-01 MED ORDER — WARFARIN - PHARMACIST DOSING INPATIENT: Freq: Every day | Status: DC

## 2023-07-01 MED ORDER — POTASSIUM CHLORIDE CRYS ER 20 MEQ PO TBCR: 40.0000 meq | EXTENDED_RELEASE_TABLET | Freq: Once | ORAL | Status: DC

## 2023-07-01 MED ORDER — WARFARIN SODIUM 5 MG PO TABS
7.5000 mg | ORAL_TABLET | Freq: Once | ORAL | Status: AC
  Administered 2023-07-01: 7.5 mg via ORAL
  Filled 2023-07-01: qty 1

## 2023-07-01 MED ORDER — ENOXAPARIN SODIUM 80 MG/0.8ML IJ SOSY
80.0000 mg | PREFILLED_SYRINGE | Freq: Two times a day (BID) | INTRAMUSCULAR | Status: DC
  Administered 2023-07-01 – 2023-07-03 (×5): 80 mg via SUBCUTANEOUS
  Filled 2023-07-01 (×6): qty 0.8

## 2023-07-01 MED ORDER — POTASSIUM CHLORIDE CRYS ER 20 MEQ PO TBCR
40.0000 meq | EXTENDED_RELEASE_TABLET | Freq: Once | ORAL | Status: AC
  Administered 2023-07-01: 40 meq via ORAL
  Filled 2023-07-01: qty 2

## 2023-07-01 NOTE — Progress Notes (Signed)
 Monitor to be read by Dr. Wyline Mood - primary cardiologist

## 2023-07-01 NOTE — Plan of Care (Signed)

## 2023-07-01 NOTE — Progress Notes (Signed)
   07/01/23 1147  TOC Brief Assessment  Insurance and Status Reviewed  Patient has primary care physician Yes  Home environment has been reviewed [id number]  [place] [address]  Prior level of function: mod independent  Prior/Current Home Services No current home services  Social Drivers of Health Review SDOH reviewed no interventions necessary  Readmission risk has been reviewed Yes  Transition of care needs no transition of care needs at this time   Pt declined HHPT/OT services

## 2023-07-01 NOTE — Progress Notes (Signed)
 PROGRESS NOTE Maria Avila  ZOX:[id number] DOB: 1950-07-01 DOA: 06/29/2023 PCP: Merri Brunette, MD   Brief Narrative/Hospital Course: 73 y.o. 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 She had a scheduled CT scan in the outpatient and after the scan, she was advised to present to the ED 2/24. In the ED, noted to be on 2 L [place] with SpO2 93 to 97%.  Labs show stable pancytopenia, normal CMP, normal BNP, normal troponin, negative COVID, RSV and flu test. EKG>sinus rhythm. CXR>moderate right pleural effusion with right lung base compressive atelectasis versus pneumonia. CTA chest PE study> with no evidence of PE but shows large right pleural effusion and significant right lung atelectasis. Patient admitted for further management     Subjective: Denies any new complaints. Currently on RA. Discussed with husband at bedside   Assessment and Plan: Principal Problem:   Pleural effusion, right Active Problems:   Chronic anemia   Metastasis from cancer of uterus (HCC)   SOB (shortness of breath)   Hypomagnesemia   Assessment and Plan:  Symptomatic large right pleural effusion Unknown etiology for now, history of endometrial CA S/p thoracentesis with 1 L fluid drainage on 2/25 (unfortunately sample analysis was not ordered) May have to order a diagnostic thoracentesis again pending discussion with oncology Recent echo showed preserved EF  Endometrial cancer Stage IVb high-grade uterine serous carcinoma now status post chemotherapy, surgery, radiation currently on adjuvant immunotherapy with trastuzumab Patient's recent imaging shows excellent response to therapy with no signs of residual disease. Recent echo also showed preserved ejection  fraction Outpatient follow-up   Hypomagnesemia Hypokalemia Replace as needed  Hypertension Soft bp, Holding home meds   A-fib with RVR Likely paroxysmal, current rate controlled Cardiology consulted, appreciate recs, start warfarin and as needed rate controlling meds as BP is currently soft   HLD Hx of CVA Has statin intolerance, previously on GTI.   Generalized weakness PT/OT eval and treat  Class I Obesity: Patient's Body mass index is 33.25 kg/m.  Lifestyle modification advised    DVT prophylaxis:  Code Status:   Code Status: Full Code Family Communication: Discussed with husband at bedside Patient status is: Remains hospitalized because of severity of illness Level of care: Telemetry   Dispo: The patient is from: HOME W/ HUSBAND            Anticipated disposition: TBD Objective: Vitals last 24 hrs: Vitals:   06/30/23 2036 07/01/23 0500 07/01/23 0505 07/01/23 1315  BP: 117/69 123/61 (!) 114/53 109/79  Pulse: 75 63 91 74  Resp: 17 18 18  (!) 24  Temp: 98.3 F (36.8 C) 98.4 F (36.9 C) 98.2 F (36.8 C) 98.5 F (36.9 C)  TempSrc: Oral Oral Oral Oral  SpO2: 96% 95% 99% 98%  Weight:      Height:       Weight change:   Physical Examination: General: NAD  Cardiovascular: S1, S2 present Respiratory: CTAB, diminished breath sounds on the right Abdomen: Soft, nontender, nondistended, bowel sounds present Musculoskeletal: No bilateral pedal edema noted Skin: Normal Psychiatry: Normal mood   Medications reviewed:  Scheduled Meds:  Chlorhexidine Gluconate Cloth  6 each Topical Daily   enoxaparin (LOVENOX) injection  80 mg Subcutaneous Q12H   multivitamin with minerals  1 tablet Oral Daily   thiamine  100 mg Oral Daily  Warfarin - Pharmacist Dosing Inpatient   Does not apply q1600  Continuous Infusions:   Diet Order             Diet regular Room service appropriate? Yes; Fluid consistency: Thin  Diet effective now                 No intake or  output data in the 24 hours ending 07/01/23 1741  Net IO Since Admission: 1,050 mL [07/01/23 1741]  Wt Readings from Last 3 Encounters:  06/29/23 82.5 kg  06/19/23 82.5 kg  06/11/23 81.9 kg     Unresulted Labs (From admission, onward)     Start     Ordered   07/01/23 1244  Protime-INR  Daily,   R      07/01/23 1243   07/01/23 0854  CBC with Differential/Platelet  Daily,   R      07/01/23 0853   07/01/23 0854  Basic metabolic panel  Daily,   R      07/01/23 0853   07/01/23 0845  Protein, pleural or peritoneal fluid  Add-on,   AD        07/01/23 0844   07/01/23 0842  Lactate dehydrogenase (pleural or peritoneal fluid)  Add-on,   AD        07/01/23 0844          Data Reviewed: I have personally reviewed following labs and imaging studies CBC: Recent Labs  Lab 06/29/23 0955 06/30/23 0417 07/01/23 1204  WBC 4.6 4.2 4.7  NEUTROABS  --   --  2.7  HGB 10.8* 10.2* 10.6*  HCT 32.8* 30.6* 32.1*  MCV 104.1* 104.4* 104.9*  PLT 168 153 163   Basic Metabolic Panel:  Recent Labs  Lab 06/29/23 0955 06/29/23 2015 06/30/23 0417 07/01/23 1204  NA 138  --  138 139  K 3.8  --  3.3* 3.4*  CL 103  --  104 102  CO2 26  --  25 26  GLUCOSE 98  --  96 120*  BUN 12  --  9 14  CREATININE 0.50  --  0.63 0.79  CALCIUM 9.6  --  9.0 9.5  MG  --  1.5* 2.1  --   PHOS  --  3.1  --   --    GFR: Estimated Creatinine Clearance: 62.4 mL/min (by C-G formula based on SCr of 0.79 mg/dL). Liver Function Tests:  Recent Labs  Lab 06/29/23 0955 07/01/23 1206  AST 18  --   ALT 11  --   ALKPHOS 49  --   BILITOT 0.6  --   PROT 7.6 7.0  ALBUMIN 4.0  --    Recent Labs    06/29/23 2015  TSH 1.606  Sepsis Labs: No results for input(s): "PROCALCITON", "LATICACIDVEN" in the last 168 hours. Recent Results (from the past 240 hours)  Resp panel by RT-PCR (RSV, Flu A&B, Covid) Anterior Nasal Swab     Status: None   Collection Time: 06/29/23  9:59 AM   Specimen: Anterior Nasal Swab  Result Value  Ref Range Status   SARS Coronavirus 2 by RT PCR NEGATIVE NEGATIVE Final    Comment: (NOTE) SARS-CoV-2 target nucleic acids are NOT DETECTED.  The SARS-CoV-2 RNA is generally detectable in upper respiratory specimens during the acute phase of infection. The lowest concentration of SARS-CoV-2 viral copies this assay can detect is 138 copies/mL. A negative result does not preclude SARS-Cov-2 infection and should not be used as the sole  basis for treatment or other patient management decisions. A negative result may occur with  improper specimen collection/handling, submission of specimen other than nasopharyngeal swab, presence of viral mutation(s) within the areas targeted by this assay, and inadequate number of viral copies(<138 copies/mL). A negative result must be combined with clinical observations, patient history, and epidemiological information. The expected result is Negative.  Fact Sheet for Patients:  BloggerCourse.com  Fact Sheet for Healthcare Providers:  SeriousBroker.it  This test is no t yet approved or cleared by the [place] FDA and  has been authorized for detection and/or diagnosis of SARS-CoV-2 by FDA under an Emergency Use Authorization (EUA). This EUA will remain  in effect (meaning this test can be used) for the duration of the COVID-19 declaration under Section 564(b)(1) of the Act, 21 U.S.C.section 360bbb-3(b)(1), unless the authorization is terminated  or revoked sooner.       Influenza A by PCR NEGATIVE NEGATIVE Final   Influenza B by PCR NEGATIVE NEGATIVE Final    Comment: (NOTE) The Xpert Xpress SARS-CoV-2/FLU/RSV plus assay is intended as an aid in the diagnosis of influenza from Nasopharyngeal swab specimens and should not be used as a sole basis for treatment. Nasal washings and aspirates are unacceptable for Xpert Xpress SARS-CoV-2/FLU/RSV testing.  Fact Sheet for  Patients: BloggerCourse.com  Fact Sheet for Healthcare Providers: SeriousBroker.it  This test is not yet approved or cleared by the [place] FDA and has been authorized for detection and/or diagnosis of SARS-CoV-2 by FDA under an Emergency Use Authorization (EUA). This EUA will remain in effect (meaning this test can be used) for the duration of the COVID-19 declaration under Section 564(b)(1) of the Act, 21 U.S.C. section 360bbb-3(b)(1), unless the authorization is terminated or revoked.     Resp Syncytial Virus by PCR NEGATIVE NEGATIVE Final    Comment: (NOTE) Fact Sheet for Patients: BloggerCourse.com  Fact Sheet for Healthcare Providers: SeriousBroker.it  This test is not yet approved or cleared by the [place] FDA and has been authorized for detection and/or diagnosis of SARS-CoV-2 by FDA under an Emergency Use Authorization (EUA). This EUA will remain in effect (meaning this test can be used) for the duration of the COVID-19 declaration under Section 564(b)(1) of the Act, 21 U.S.C. section 360bbb-3(b)(1), unless the authorization is terminated or revoked.  Performed at [facility], [address] [place], [place] [address]   Antimicrobials/Microbiology: Anti-infectives (From admission, onward)    None         Component Value Date/Time   SDES  04/09/2023 1052    URINE, CLEAN CATCH Performed at [facility], [address] [place], [place] [address]    SPECREQUEST  04/09/2023 1052    NONE Performed at [facility], [address] [place], [place] [address]    CULT 50,000 COLONIES/mL ESCHERICHIA COLI (A) 04/09/2023 1052   REPTSTATUS 04/11/2023 FINAL 04/09/2023 1052     Radiology Studies: DG Chest 1 View Result Date: 06/30/2023 CLINICAL DATA:  [id number] S/P thoracentesis [id number] EXAM:  CHEST  1 VIEW COMPARISON:  06/29/2023. FINDINGS: There is moderate right pleural effusion, decreased since the prior study, status post thoracentesis. No pneumothorax. There are probable associated compressive atelectatic changes in the right lung. Bilateral lung fields are otherwise clear. Left lateral costophrenic angle is clear. Evaluation of cardiomediastinal silhouette is nondiagnostic due to right lower hemithorax opacification. No acute osseous abnormalities. The soft tissues are within normal limits. Right-sided CT Port-A-Cath is seen with  its tip overlying the cavoatrial junction region, unchanged. IMPRESSION: Moderate right pleural effusion, decreased since the prior study, status post thoracentesis. No pneumothorax. Electronically Signed   By: Jules Schick M.D.   On: 06/30/2023 11:54   US THORACENTESIS ASP PLEURAL SPACE W/IMG GUIDE Result Date: 06/30/2023 INDICATION: 73 year old female with uterine cancer and right pleural effusion. EXAM: ULTRASOUND GUIDED RIGHT THORACENTESIS MEDICATIONS: 10 mL 1% lidocaine COMPLICATIONS: None immediate. PROCEDURE: An ultrasound guided thoracentesis was thoroughly discussed with the patient and questions answered. The benefits, risks, alternatives and complications were also discussed. The patient understands and wishes to proceed with the procedure. Written consent was obtained. Ultrasound was performed to localize and mark an adequate pocket of fluid in the right chest. The area was then prepped and draped in the normal sterile fashion. 1% Lidocaine was used for local anesthesia. Under ultrasound guidance a 6 Fr Safe-T-Centesis catheter was introduced. Thoracentesis was performed. The catheter was removed and a dressing applied. FINDINGS: A total of approximately 1.1 L of clear amber fluid was removed. IMPRESSION: Successful ultrasound guided right thoracentesis yielding 1.1 L of pleural fluid. Performed By Theresa Mulligan, PA-C Electronically Signed   By: Marliss Coots M.D.   On: 06/30/2023 11:37   LOS: 0 days    Briant Cedar, MD  [facility]  07/01/2023, 5:41 PM

## 2023-07-01 NOTE — Progress Notes (Signed)
 PHARMACY - ANTICOAGULATION CONSULT NOTE  Pharmacy Consult for warfarin Indication: atrial fibrillation  Allergies  Allergen Reactions   Crestor [Rosuvastatin Calcium] Swelling and Other (See Comments)    Swelling of legs and muscle weakness   Simvastatin Swelling and Other (See Comments)    Swelling of legs and muscle weakness    Patient Measurements: Height: 5\' 2"  (157.5 cm) Weight: 82.5 kg (181 lb 12.8 oz) IBW/kg (Calculated) : 50.1  Vital Signs: Temp: 98.2 F (36.8 C) (02/26 0505) Temp Source: Oral (02/26 0505) BP: 114/53 (02/26 0505) Pulse Rate: 91 (02/26 0505)  Labs: Recent Labs    06/29/23 0955 06/30/23 0417  HGB 10.8* 10.2*  HCT 32.8* 30.6*  PLT 168 153  CREATININE 0.50 0.63  TROPONINIHS 3  --     Estimated Creatinine Clearance: 62.4 mL/min (by C-G formula based on SCr of 0.63 mg/dL).   Medical History: Past Medical History:  Diagnosis Date   Colitis    Endometrial cancer (HCC)    History of radiation therapy    Vagina-04/20/23-05/11/23- Dr. Antony Blackbird   Hyperlipidemia    Hypertension    Malnutrition (HCC)    Stroke [facility]) 2019   balance still affected    Medications:  No prior to admission anticoagulant meds listed although patient reported being on apixaban in the past Currently on aspirin  81 mg daily and enoxaparin 40 mg subcutaneous daily for VTE prophylaxis, last dose enoxaparin 2/25 2110  Assessment: Pharmacy consulted to dose warfarin for this 73 yo female with new onset atrial fibrillation and unable to affort a direct oral anticoagulant (DOAC).    CHA2DS2-VASc risk score: 5 (female, age, HTN, History of stroke). Prien Coumadin Dosing Nomogram & Total Points: 5  Goal of Therapy:  Reduce risk of stroke and systemic embolism in patients with afib Monitor platelets by anticoagulation protocol: Yes INR: 2-3   Plan:  Baseline PT/INR Warfarin 7.5 mg po x 1 today Per Cardiology,  Bridge with enoxaparin at approximately 1mg /kg  subcutaneous q12hr until INR within goal INR 2-3 Discontinue aspirin Monitor daily PT/INR for warfarin dose & adjustment, CBC, signs/symptoms of bleeding   Thank you for allowing pharmacy to be a part of this patient's care.  Selinda Eon, PharmD, BCPS Clinical Pharmacist [place] 07/01/2023 12:15 PM

## 2023-07-01 NOTE — Progress Notes (Signed)
 Maria Avila   DOB:December 10, 1950   ZO#:[id number]    ASSESSMENT & PLAN:  Uterine cancer Her last imaging study showed no evidence of residual disease and she is currently on maintenance trastuzumab Unfortunately, she developed pleural effusion Malignancy cannot be excluded Cytology from recent thoracentesis is still pending Continue supportive care  New onset right-sided pleural effusion She still have persistent effusion on exam and recent x-ray Currently, she is not symptomatic Awaiting fluid cytology and microbiology study to determine the cause of her pleural effusion  New onset atrial fibrillation History of stroke She is being evaluated by cardiologist I recommend maximize supportive care including anticoagulation therapy  Anemia chronic illness This is stable Observe closely  Discharge planning Unknown She has appointment to see me back next week We will keep appointment as scheduled for now  All questions were answered. The patient knows to call the clinic with any problems, questions or concerns.   The total time spent in the appointment was 55 minutes encounter with patients including review of chart and various tests results, discussions about plan of care and coordination of care plan  Artis Delay, MD 07/01/2023 10:54 AM  Subjective:  Patient is seen in her room.  She was admitted due to shortness of breath She was found to have evidence of pleural effusion status post right thoracentesis and also new onset of atrial fibrillation.  She felt better since thoracentesis She denies shortness of breath Oxygen saturation is excellent and room air  Objective:  Vitals:   07/01/23 0500 07/01/23 0505  BP: 123/61 (!) 114/53  Pulse: 63 91  Resp: 18 18  Temp: 98.4 F (36.9 C) 98.2 F (36.8 C)  SpO2: 95% 99%    No intake or output data in the 24 hours ending 07/01/23 1054  GENERAL:alert, no distress and comfortable    Labs:  Recent Labs    05/29/23 0816  06/19/23 0819 06/29/23 0955 06/30/23 0417  NA 140 139 138 138  K 3.4* 3.6 3.8 3.3*  CL 105 105 103 104  CO2 28 27 26 25   GLUCOSE 91 85 98 96  BUN 17 15 12 9   CREATININE 0.76 0.68 0.50 0.63  CALCIUM 10.2 10.0 9.6 9.0  GFRNONAA >60 >60 >60 >60  PROT 7.1 7.3 7.6  --   ALBUMIN 4.3 4.3 4.0  --   AST 15 14* 18  --   ALT 11 10 11   --   ALKPHOS 48 52 49  --   BILITOT 0.5 0.5 0.6  --     Studies: I have personally reviewed her CT imaging DG Chest 1 View Result Date: 06/30/2023 CLINICAL DATA:  [id number] S/P thoracentesis [id number] EXAM: CHEST  1 VIEW COMPARISON:  06/29/2023. FINDINGS: There is moderate right pleural effusion, decreased since the prior study, status post thoracentesis. No pneumothorax. There are probable associated compressive atelectatic changes in the right lung. Bilateral lung fields are otherwise clear. Left lateral costophrenic angle is clear. Evaluation of cardiomediastinal silhouette is nondiagnostic due to right lower hemithorax opacification. No acute osseous abnormalities. The soft tissues are within normal limits. Right-sided CT Port-A-Cath is seen with its tip overlying the cavoatrial junction region, unchanged. IMPRESSION: Moderate right pleural effusion, decreased since the prior study, status post thoracentesis. No pneumothorax. Electronically Signed   By: Jules Schick M.D.   On: 06/30/2023 11:54   US THORACENTESIS ASP PLEURAL SPACE W/IMG GUIDE Result Date: 06/30/2023 INDICATION: 73 year old female with uterine cancer and right pleural effusion. EXAM: ULTRASOUND GUIDED  RIGHT THORACENTESIS MEDICATIONS: 10 mL 1% lidocaine COMPLICATIONS: None immediate. PROCEDURE: An ultrasound guided thoracentesis was thoroughly discussed with the patient and questions answered. The benefits, risks, alternatives and complications were also discussed. The patient understands and wishes to proceed with the procedure. Written consent was obtained. Ultrasound was performed to localize and mark an  adequate pocket of fluid in the right chest. The area was then prepped and draped in the normal sterile fashion. 1% Lidocaine was used for local anesthesia. Under ultrasound guidance a 6 Fr Safe-T-Centesis catheter was introduced. Thoracentesis was performed. The catheter was removed and a dressing applied. FINDINGS: A total of approximately 1.1 L of clear amber fluid was removed. IMPRESSION: Successful ultrasound guided right thoracentesis yielding 1.1 L of pleural fluid. Performed By Theresa Mulligan, PA-C Electronically Signed   By: Marliss Coots M.D.   On: 06/30/2023 11:37   CT Angio Chest PE W/Cm &/Or Wo Cm Result Date: 06/29/2023 CLINICAL DATA:  History of endometrial cancer, weakness, fatigue, dyspnea on exertion EXAM: CT ANGIOGRAPHY CHEST WITH CONTRAST TECHNIQUE: Multidetector CT imaging of the chest was performed using the standard protocol during bolus administration of intravenous contrast. Multiplanar CT image reconstructions and MIPs were obtained to evaluate the vascular anatomy. RADIATION DOSE REDUCTION: This exam was performed according to the departmental dose-optimization program which includes automated exposure control, adjustment of the mA and/or kV according to patient size and/or use of iterative reconstruction technique. CONTRAST:  75mL OMNIPAQUE IOHEXOL 350 MG/ML SOLN COMPARISON:  04/03/2023, 06/29/2023 FINDINGS: Cardiovascular: This is a technically adequate evaluation of the pulmonary vasculature. No filling defects or pulmonary emboli. The heart is unremarkable without pericardial effusion. No evidence of thoracic aortic aneurysm or dissection. Atherosclerosis of the aorta and coronary vasculature. Stable right chest wall port via internal jugular approach, tip within the superior vena cava at the atriocaval junction. Mediastinum/Nodes: No enlarged mediastinal, hilar, or axillary lymph nodes. Thyroid gland, trachea, and esophagus demonstrate no significant findings. Lungs/Pleura: There  is a large right pleural effusion, volume estimated in excess of 2 L. There is dense consolidation and volume loss within the right middle and right lower lobes consistent with atelectasis. Minimal dependent atelectasis within the right upper lobe. Left lung is clear. No pneumothorax. Central airways are patent. Upper Abdomen: No acute abnormality. Musculoskeletal: No acute or destructive bony abnormalities. Reconstructed images demonstrate no additional findings. Review of the MIP images confirms the above findings. IMPRESSION: 1. No evidence of pulmonary embolus. 2. Large right pleural effusion volume estimated in excess of 2 L. 3. Significant right lung atelectasis, most pronounced within the right middle and right lower lobes. 4. Aortic Atherosclerosis (ICD10-I70.0). Coronary artery atherosclerosis. Electronically Signed   By: Sharlet Salina M.D.   On: 06/29/2023 19:24   DG Chest Port 1 View Result Date: 06/29/2023 CLINICAL DATA:  Shortness of breath. EXAM: PORTABLE CHEST 1 VIEW COMPARISON:  Chest radiograph dated 04/02/2018 and CT dated 04/03/2023. FINDINGS: Risser Port-A-Cath with tip in the region of the cavoatrial junction. Moderate right pleural effusion with right lung base compressive atelectasis versus pneumonia. The left lung is clear. No pneumothorax. Stable cardiac silhouette. No acute osseous pathology. IMPRESSION: Moderate right pleural effusion with right lung base compressive atelectasis versus pneumonia. Electronically Signed   By: Elgie Collard M.D.   On: 06/29/2023 10:47

## 2023-07-01 NOTE — Progress Notes (Unsigned)
 Enrolled for [employer] to mail a ZIO XT long term holter monitor to the patients address on file.   Letter with instructions mailed to patient.  Dr. Carolan Clines to read.

## 2023-07-01 NOTE — Telephone Encounter (Signed)
 Patient Product/process development scientist completed.    The patient is insured through [facility]. Patient has Medicare and is not eligible for a copay card, but may be able to apply for patient assistance or Medicare RX Payment Plan (Patient Must reach out to their plan, if eligible for payment plan), if available.    Ran test claim for Eliquis 5 mg and the current 30 day co-pay is $271.57 due to a deductible.   This test claim was processed through [facility]- copay amounts may vary at other pharmacies due to pharmacy/plan contracts, or as the patient moves through the different stages of their insurance plan.     Roland Earl, CPHT Pharmacy Technician III Certified Patient Advocate [facility] Patient Advocate Team Direct Number: [phone]  Fax: [phone]

## 2023-07-01 NOTE — Consult Note (Addendum)
 Cardiology Consultation   Patient ID: Maria Avila MRN: [id number]; DOB: 04/03/1951  Admit date: 06/29/2023 Date of Consult: 07/01/2023  PCP:  Merri Brunette, MD   [facility] Providers Cardiologist:  Maisie Fus, MD  -- New this admission    Patient Profile:   Maria Avila is a 73 y.o. female with a hx of endometrial cancer s/p chemotherapy, surgery and radiation, now on adjuvant immunotherapy, history of CVA, HLD, HTN who is being seen 07/01/2023 for the evaluation of atrial fibrillation at the request of Dr. Sharolyn Douglas.  History of Present Illness:   [name] is a 73 year old female with above medical history. Patient does not have any past cardiac history and does not follow with a cardiologist.   Patient had her last chemotherapy session on 2/14. Afterwards, developed fatigue, generalized weakness, shortness of breath, DOE. She was scheduled for an outpatient CT abdomen pelvis on 2/24. She went for the scan and was instructed to go to the ED after she was found to have a new pleural effusion. In the ED, CTA chest showed no evidence of PE, a large right pleural effusion with volume estimated in excess of 2 L. Labwork significant for creatinine 0.50, K 3.8, WBC 4.6, hemoglobin 10.8, platelets 168. BNP 28.7. hsTn 3.   After her CT scan, patient was found to have HR elevated to the 120s-150s. EKG showed atrial fibrillation with HR 144 BPM. She was given IF fluids with improvement in HR and she is now maintaining NSR. Mag was 1.5 and has been repleated.   Patient was admitted to the internal medicine service for treatment of pleural effusion and shortness of breath. On 2/25, she underwent successful US guided right thoracentesis that yielded 1.1 L of clear amber fluid. CXR following the procedure showed a moderate right pleural effusion, no pneumothorax.   On interview, patient denies any past cardiac history. She had gone to the ED due to shortness of breath after imaging  showed a pleural effusion. At that time, she had some left flank pain, but no chest pain. She had a CT scan after she arrived in the ED. After the scan, she developed tachycardia. She was aware that her HR was elevated and she could feel a fluttering in her chest. She denies ever having these symptoms in the past, and has not had any symptoms since this episode. Today, she is maintaining NSR. She denies palpitations, chest pain. Her breathing has significantly improved following her thoracentesis.   Reports that after her CVA in the past, she was on eliquis. Has not recently been on a blood thinner. She denies vaginal or GI bleeding. Discussed that while her episode of afib was brief, she does have a history of CVA. This makes her CHADS-VASc score at least a 5. Recommended starting anticoagulation. Patient is willing to start as long as cost is manageable    Past Medical History:  Diagnosis Date   Colitis    Endometrial cancer [facility]    History of radiation therapy    Vagina-04/20/23-05/11/23- Dr. Antony Blackbird   Hyperlipidemia    Hypertension    Malnutrition (HCC)    Stroke [facility]) 2019   balance still affected    Past Surgical History:  Procedure Laterality Date   BIOPSY  02/26/2018   Procedure: BIOPSY;  Surgeon: Charlott Rakes, MD;  Location: [facility] ENDOSCOPY;  Service: Endoscopy;;   COLONOSCOPY WITH PROPOFOL N/A 02/26/2018   Procedure: COLONOSCOPY WITH PROPOFOL;  Surgeon: Charlott Rakes, MD;  Location: [facility] ENDOSCOPY;  Service: Endoscopy;  Laterality: N/A;   ESOPHAGOGASTRODUODENOSCOPY (EGD) WITH PROPOFOL N/A 02/26/2018   Procedure: ESOPHAGOGASTRODUODENOSCOPY (EGD) WITH PROPOFOL;  Surgeon: Charlott Rakes, MD;  Location: [facility] ENDOSCOPY;  Service: Endoscopy;  Laterality: N/A;   IR IMAGING GUIDED PORT INSERTION  10/21/2022   NO PAST SURGERIES     UNCERTAIN OF NAME OF SURGERY     Home Medications:  Prior to Admission medications   Medication Sig Start Date End Date Taking? Authorizing  Provider  aspirin EC 81 MG tablet Take 81 mg by mouth daily. Swallow whole.   Yes [provider]  benazepril-hydrochlorthiazide (LOTENSIN HCT) 20-25 MG tablet Take 1 tablet by mouth daily. 11/22/19  Yes [provider]  [employer] VITAMIN C 500 MG tablet TAKE 1 TABLET BY MOUTH TWICE A DAY 05/26/18  Yes Jones Bales, NP  lidocaine-prilocaine (EMLA) cream Apply 1 Application topically daily as needed (prior to port access). 06/19/23  Yes Artis Delay, MD  Multiple Vitamin (MULTIVITAMIN) capsule Take 1 capsule by mouth daily with breakfast. 07/21/17  Yes [provider]  ondansetron (ZOFRAN) 8 MG tablet Take 1 tablet (8 mg total) by mouth every 8 (eight) hours as needed for nausea. 10/23/22  Yes Gorsuch, Ni, MD  oxyCODONE (OXY IR/ROXICODONE) 5 MG immediate release tablet Take 1 tablet (5 mg total) by mouth every 6 (six) hours as needed for severe pain (pain score 7-10). 06/19/23  Yes Artis Delay, MD  prochlorperazine (COMPAZINE) 10 MG tablet Take 1 tablet (10 mg total) by mouth every 6 (six) hours as needed for nausea or vomiting. 10/23/22  Yes Gorsuch, Ni, MD  sennosides-docusate sodium (SENOKOT-S) 8.6-50 MG tablet Take 1 tablet by mouth 2 (two) times daily as needed for constipation.   Yes [provider]  thiamine (VITAMIN B-1) 100 MG tablet TAKE 1 TABLET BY MOUTH EVERY DAY Patient taking differently: Take 100 mg by mouth daily. 05/25/18  Yes Kirsteins, Victorino Sparrow, MD  TYLENOL 8 HOUR ARTHRITIS PAIN 650 MG CR tablet Take 650-1,300 mg by mouth every 8 (eight) hours as needed for pain.   Yes [provider]    Inpatient Medications: Scheduled Meds:  aspirin EC  81 mg Oral Daily   Chlorhexidine Gluconate Cloth  6 each Topical Daily   enoxaparin (LOVENOX) injection  40 mg Subcutaneous QHS   multivitamin with minerals  1 tablet Oral Daily   thiamine  100 mg Oral Daily   Continuous Infusions:  PRN Meds: acetaminophen **OR** acetaminophen, iohexol,  lidocaine-prilocaine, ondansetron **OR** ondansetron (ZOFRAN) IV, oxyCODONE, senna-docusate, sodium chloride flush  Allergies:    Allergies  Allergen Reactions   Crestor [Rosuvastatin Calcium] Swelling and Other (See Comments)    Swelling of legs and muscle weakness   Simvastatin Swelling and Other (See Comments)    Swelling of legs and muscle weakness    Social History:   Social History   Socioeconomic History   Marital status: Married    Spouse name: Not on file   Number of children: Not on file   Years of education: Not on file   Highest education level: Not on file  Occupational History   Not on file  Tobacco Use   Smoking status: Former    Current packs/day: 0.00    Types: Cigarettes    Quit date: 1994    Years since quitting: 31.1   Smokeless tobacco: Never  Vaping Use   Vaping status: Never Used  Substance and Sexual Activity   Alcohol use: Yes  Comment: occ wine   Drug use: Not Currently   Sexual activity: Yes  Other Topics Concern   Not on file  Social History Narrative   Not on file   Social Drivers of Health   Financial Resource Strain: Not on file  Food Insecurity: No Food Insecurity (06/30/2023)   Hunger Vital Sign    Worried About Running Out of Food in the Last Year: Never true    Ran Out of Food in the Last Year: Never true  Transportation Needs: No Transportation Needs (06/30/2023)   PRAPARE - Administrator, Civil Service (Medical): No    Lack of Transportation (Non-Medical): No  Physical Activity: Not on file  Stress: Not on file  Social Connections: Socially Isolated (06/30/2023)   Social Connection and Isolation Panel [NHANES]    Frequency of Communication with Friends and Family: Once a week    Frequency of Social Gatherings with Friends and Family: Never    Attends Religious Services: Never    Database administrator or Organizations: No    Attends Banker Meetings: Never    Marital Status: Married  Careers information officer Violence: Not At Risk (06/30/2023)   Humiliation, Afraid, Rape, and Kick questionnaire    Fear of Current or Ex-Partner: No    Emotionally Abused: No    Physically Abused: No    Sexually Abused: No    Family History:    Family History  Problem Relation Age of Onset   Alzheimer's disease Mother    Colon cancer Father    Endometrial cancer Sister        gyn, unsure type   Breast cancer Neg Hx    Ovarian cancer Neg Hx    Prostate cancer Neg Hx    Pancreatic cancer Neg Hx      ROS:  Please see the history of present illness.   All other ROS reviewed and negative.     Physical Exam/Data:   Vitals:   06/30/23 1555 06/30/23 2036 07/01/23 0500 07/01/23 0505  BP: 119/71 117/69 123/61 (!) 114/53  Pulse: 81 75 63 91  Resp: 18 17 18 18   Temp: 98 F (36.7 C) 98.3 F (36.8 C) 98.4 F (36.9 C) 98.2 F (36.8 C)  TempSrc: Oral Oral Oral Oral  SpO2: 92% 96% 95% 99%  Weight:      Height:       No intake or output data in the 24 hours ending 07/01/23 1045    06/29/2023    8:53 AM 06/19/2023    8:39 AM 06/11/2023   10:23 AM  Last 3 Weights  Weight (lbs) 181 lb 12.8 oz 181 lb 12.8 oz 180 lb 8 oz  Weight (kg) 82.464 kg 82.464 kg 81.874 kg     Body mass index is 33.25 kg/m.  General:  Elderly female. Sitting upright in the bed in no acute distress.  HEENT: normal Neck: no JVD Vascular: Radial pulses 2+ bilaterally Cardiac:  normal S1, S2; RRR; no murmur  Lungs: Diminished breath sounds in right lower lung. Otherwise clear with normal work of breathing on room air  Abd: soft, nontender  Ext: no edema in BLE  Musculoskeletal:  No deformities  Skin: warm and dry  Neuro:  CNs 2-12 intact, no focal abnormalities noted Psych:  Normal affect   EKG:  The EKG was personally reviewed and demonstrates:  EKG from 1/24 at 18:08 showed atrial fibrillation with HR 144 BPM. EKG from 2/25 showed normal sinus  rhythm, TWI in V4-V6, aVF.  Telemetry:  Telemetry was personally reviewed and  demonstrates:  Nsr   Relevant CV Studies: Echocardiogram 05/27/23   1. Left ventricular ejection fraction, by estimation, is 65 to 70%. The  left ventricle has normal function. The left ventricle has no regional  wall motion abnormalities. There is mild left ventricular hypertrophy.  Left ventricular diastolic parameters  are consistent with Grade I diastolic dysfunction (impaired relaxation).  The average left ventricular global longitudinal strain is -22.5 %. The  global longitudinal strain is normal.   2. Right ventricular systolic function is normal. The right ventricular  size is normal. There is normal pulmonary artery systolic pressure.   3. Left atrial size was moderately dilated.   4. Right atrial size was mildly dilated.   5. The mitral valve is degenerative. Trivial mitral valve regurgitation.  No evidence of mitral stenosis.   6. The aortic valve is normal in structure. There is mild calcification  of the aortic valve. Aortic valve regurgitation is not visualized. No  aortic stenosis is present.   7. The inferior vena cava is dilated in size with <50% respiratory  variability, suggesting right atrial pressure of 15 mmHg.   Laboratory Data:  High Sensitivity Troponin:   Recent Labs  Lab 06/29/23 0955  TROPONINIHS 3     Chemistry Recent Labs  Lab 06/29/23 0955 06/29/23 2015 06/30/23 0417  NA 138  --  138  K 3.8  --  3.3*  CL 103  --  104  CO2 26  --  25  GLUCOSE 98  --  96  BUN 12  --  9  CREATININE 0.50  --  0.63  CALCIUM 9.6  --  9.0  MG  --  1.5* 2.1  GFRNONAA >60  --  >60  ANIONGAP 9  --  9    Recent Labs  Lab 06/29/23 0955  PROT 7.6  ALBUMIN 4.0  AST 18  ALT 11  ALKPHOS 49  BILITOT 0.6   Lipids No results for input(s): "CHOL", "TRIG", "HDL", "LABVLDL", "LDLCALC", "CHOLHDL" in the last 168 hours.  Hematology Recent Labs  Lab 06/29/23 0955 06/30/23 0417  WBC 4.6 4.2  RBC 3.15* 2.93*  HGB 10.8* 10.2*  HCT 32.8* 30.6*  MCV 104.1* 104.4*   MCH 34.3* 34.8*  MCHC 32.9 33.3  RDW 12.8 12.7  PLT 168 153   Thyroid  Recent Labs  Lab 06/29/23 2015  TSH 1.606    BNP Recent Labs  Lab 06/29/23 0955  BNP 28.7    DDimer No results for input(s): "DDIMER" in the last 168 hours.   Radiology/Studies:  DG Chest 1 View Result Date: 06/30/2023 CLINICAL DATA:  [id number] S/P thoracentesis [id number] EXAM: CHEST  1 VIEW COMPARISON:  06/29/2023. FINDINGS: There is moderate right pleural effusion, decreased since the prior study, status post thoracentesis. No pneumothorax. There are probable associated compressive atelectatic changes in the right lung. Bilateral lung fields are otherwise clear. Left lateral costophrenic angle is clear. Evaluation of cardiomediastinal silhouette is nondiagnostic due to right lower hemithorax opacification. No acute osseous abnormalities. The soft tissues are within normal limits. Right-sided CT Port-A-Cath is seen with its tip overlying the cavoatrial junction region, unchanged. IMPRESSION: Moderate right pleural effusion, decreased since the prior study, status post thoracentesis. No pneumothorax. Electronically Signed   By: Jules Schick M.D.   On: 06/30/2023 11:54   US THORACENTESIS ASP PLEURAL SPACE W/IMG GUIDE Result Date: 06/30/2023 INDICATION: 73 year old female with uterine  cancer and right pleural effusion. EXAM: ULTRASOUND GUIDED RIGHT THORACENTESIS MEDICATIONS: 10 mL 1% lidocaine COMPLICATIONS: None immediate. PROCEDURE: An ultrasound guided thoracentesis was thoroughly discussed with the patient and questions answered. The benefits, risks, alternatives and complications were also discussed. The patient understands and wishes to proceed with the procedure. Written consent was obtained. Ultrasound was performed to localize and mark an adequate pocket of fluid in the right chest. The area was then prepped and draped in the normal sterile fashion. 1% Lidocaine was used for local anesthesia. Under ultrasound  guidance a 6 Fr Safe-T-Centesis catheter was introduced. Thoracentesis was performed. The catheter was removed and a dressing applied. FINDINGS: A total of approximately 1.1 L of clear amber fluid was removed. IMPRESSION: Successful ultrasound guided right thoracentesis yielding 1.1 L of pleural fluid. Performed By Theresa Mulligan, PA-C Electronically Signed   By: Marliss Coots M.D.   On: 06/30/2023 11:37   CT Angio Chest PE W/Cm &/Or Wo Cm Result Date: 06/29/2023 CLINICAL DATA:  History of endometrial cancer, weakness, fatigue, dyspnea on exertion EXAM: CT ANGIOGRAPHY CHEST WITH CONTRAST TECHNIQUE: Multidetector CT imaging of the chest was performed using the standard protocol during bolus administration of intravenous contrast. Multiplanar CT image reconstructions and MIPs were obtained to evaluate the vascular anatomy. RADIATION DOSE REDUCTION: This exam was performed according to the departmental dose-optimization program which includes automated exposure control, adjustment of the mA and/or kV according to patient size and/or use of iterative reconstruction technique. CONTRAST:  75mL OMNIPAQUE IOHEXOL 350 MG/ML SOLN COMPARISON:  04/03/2023, 06/29/2023 FINDINGS: Cardiovascular: This is a technically adequate evaluation of the pulmonary vasculature. No filling defects or pulmonary emboli. The heart is unremarkable without pericardial effusion. No evidence of thoracic aortic aneurysm or dissection. Atherosclerosis of the aorta and coronary vasculature. Stable right chest wall port via internal jugular approach, tip within the superior vena cava at the atriocaval junction. Mediastinum/Nodes: No enlarged mediastinal, hilar, or axillary lymph nodes. Thyroid gland, trachea, and esophagus demonstrate no significant findings. Lungs/Pleura: There is a large right pleural effusion, volume estimated in excess of 2 L. There is dense consolidation and volume loss within the right middle and right lower lobes consistent  with atelectasis. Minimal dependent atelectasis within the right upper lobe. Left lung is clear. No pneumothorax. Central airways are patent. Upper Abdomen: No acute abnormality. Musculoskeletal: No acute or destructive bony abnormalities. Reconstructed images demonstrate no additional findings. Review of the MIP images confirms the above findings. IMPRESSION: 1. No evidence of pulmonary embolus. 2. Large right pleural effusion volume estimated in excess of 2 L. 3. Significant right lung atelectasis, most pronounced within the right middle and right lower lobes. 4. Aortic Atherosclerosis (ICD10-I70.0). Coronary artery atherosclerosis. Electronically Signed   By: Sharlet Salina M.D.   On: 06/29/2023 19:24   DG Chest Port 1 View Result Date: 06/29/2023 CLINICAL DATA:  Shortness of breath. EXAM: PORTABLE CHEST 1 VIEW COMPARISON:  Chest radiograph dated 04/02/2018 and CT dated 04/03/2023. FINDINGS: Risser Port-A-Cath with tip in the region of the cavoatrial junction. Moderate right pleural effusion with right lung base compressive atelectasis versus pneumonia. The left lung is clear. No pneumothorax. Stable cardiac silhouette. No acute osseous pathology. IMPRESSION: Moderate right pleural effusion with right lung base compressive atelectasis versus pneumonia. Electronically Signed   By: Elgie Collard M.D.   On: 06/29/2023 10:47     Assessment and Plan:   Paroxysmal Atrial Fibrillation  - After CT scan in the ED, patient developed atrial fibrillation with HR in the  120s-150s. Episode lasted about 1 hour. She converted to NSR after receiving IV fluids and has been maintaining NSR since - Patient was symptomatic when in afib with palpitations. Denies having similar symptoms in the past, has not had palpitations since being back in NSR  - Afib occurred in the setting of pleural effusion, hypomagnesemia. TSH normal  - Start PRN metoprolol for palpitations, tachycardia - CHADS-VASc 5 (HTN, age, gender,  history of CVA). She was previously on eliquis following CVA in the past  - Although her episode of afib was brief, recommend starting AC due to high CHADS-VASc.  - Start eliquis 5 mg BID. Patient denies vaginal or GI bleeding. She is concerned about eliquis cost. I have messaged our pharmacy staff for cost. She is also willing to consider coumadin if cost prohibitive - Consider outpatient monitor to assess afib burden and HR control   R Pleural Effusion - now s/p thoracentesis. Cytology pending  - BNP 28.7  - Recent echo from 05/2023 showed EF 65-70%, no regional wall motion abnormalities, grade I DD, normal RV function  - Management per primary   Otherwise per primary  - Chronic anemia  - Endometrial cancer  - Hypomagnesemia and hypokalemia  - History of CVA - intolerant to statins  - Generalized weakness    Risk Assessment/Risk Scores:    CHA2DS2-VASc Score = 5  This indicates a 7.2% annual risk of stroke. The patient's score is based upon: CHF History: 0 HTN History: 1 Diabetes History: 0 Stroke History: 2 Vascular Disease History: 0 Age Score: 1 Gender Score: 1    For questions or updates, please contact [facility] Please consult [email or web address] for contact info under    Signed, Jonita Albee, PA-C  07/01/2023 10:45 AM

## 2023-07-01 NOTE — Plan of Care (Signed)
  Problem: Education: Goal: Knowledge of General Education information will improve Description: Including pain rating scale, medication(s)/side effects and non-pharmacologic comfort measures Outcome: Progressing   Problem: Clinical Measurements: Goal: Ability to maintain clinical measurements within normal limits will improve Outcome: Progressing   Problem: Nutrition: Goal: Adequate nutrition will be maintained Outcome: Progressing   Problem: Pain Managment: Goal: General experience of comfort will improve and/or be controlled Outcome: Progressing   Problem: Safety: Goal: Ability to remain free from injury will improve Outcome: Progressing   Problem: Skin Integrity: Goal: Risk for impaired skin integrity will decrease Outcome: Progressing

## 2023-07-02 ENCOUNTER — Observation Stay (HOSPITAL_COMMUNITY): Payer: Medicare Other

## 2023-07-02 ENCOUNTER — Observation Stay (HOSPITAL_BASED_OUTPATIENT_CLINIC_OR_DEPARTMENT_OTHER): Payer: Medicare Other

## 2023-07-02 DIAGNOSIS — C801 Malignant (primary) neoplasm, unspecified: Secondary | ICD-10-CM | POA: Diagnosis not present

## 2023-07-02 DIAGNOSIS — R238 Other skin changes: Secondary | ICD-10-CM | POA: Diagnosis not present

## 2023-07-02 DIAGNOSIS — R9431 Abnormal electrocardiogram [ECG] [EKG]: Secondary | ICD-10-CM | POA: Diagnosis not present

## 2023-07-02 DIAGNOSIS — Z48813 Encounter for surgical aftercare following surgery on the respiratory system: Secondary | ICD-10-CM | POA: Diagnosis not present

## 2023-07-02 DIAGNOSIS — J9811 Atelectasis: Secondary | ICD-10-CM | POA: Diagnosis not present

## 2023-07-02 DIAGNOSIS — Z452 Encounter for adjustment and management of vascular access device: Secondary | ICD-10-CM | POA: Diagnosis not present

## 2023-07-02 DIAGNOSIS — J910 Malignant pleural effusion: Secondary | ICD-10-CM | POA: Diagnosis not present

## 2023-07-02 DIAGNOSIS — J90 Pleural effusion, not elsewhere classified: Secondary | ICD-10-CM | POA: Diagnosis not present

## 2023-07-02 LAB — PROTEIN, PLEURAL OR PERITONEAL FLUID: Total protein, fluid: 4.8 g/dL

## 2023-07-02 LAB — CBC WITH DIFFERENTIAL/PLATELET
Abs Immature Granulocytes: 0.01 10*3/uL (ref 0.00–0.07)
Basophils Absolute: 0 10*3/uL (ref 0.0–0.1)
Basophils Relative: 0 %
Eosinophils Absolute: 0.1 10*3/uL (ref 0.0–0.5)
Eosinophils Relative: 2 %
HCT: 30.6 % — ABNORMAL LOW (ref 36.0–46.0)
Hemoglobin: 10.3 g/dL — ABNORMAL LOW (ref 12.0–15.0)
Immature Granulocytes: 0 %
Lymphocytes Relative: 45 %
Lymphs Abs: 2 10*3/uL (ref 0.7–4.0)
MCH: 35 pg — ABNORMAL HIGH (ref 26.0–34.0)
MCHC: 33.7 g/dL (ref 30.0–36.0)
MCV: 104.1 fL — ABNORMAL HIGH (ref 80.0–100.0)
Monocytes Absolute: 0.3 10*3/uL (ref 0.1–1.0)
Monocytes Relative: 7 %
Neutro Abs: 2 10*3/uL (ref 1.7–7.7)
Neutrophils Relative %: 46 %
Platelets: 159 10*3/uL (ref 150–400)
RBC: 2.94 MIL/uL — ABNORMAL LOW (ref 3.87–5.11)
RDW: 12.4 % (ref 11.5–15.5)
WBC: 4.5 10*3/uL (ref 4.0–10.5)
nRBC: 0 % (ref 0.0–0.2)

## 2023-07-02 LAB — BODY FLUID CELL COUNT WITH DIFFERENTIAL
Eos, Fluid: 0 %
Lymphs, Fluid: 83 %
Monocyte-Macrophage-Serous Fluid: 8 % — ABNORMAL LOW (ref 50–90)
Neutrophil Count, Fluid: 9 % (ref 0–25)
Total Nucleated Cell Count, Fluid: UNDETERMINED uL (ref 0–1000)

## 2023-07-02 LAB — ECHOCARDIOGRAM LIMITED
Height: 62 in
S' Lateral: 3 cm
Weight: 2908.8 [oz_av]

## 2023-07-02 LAB — BASIC METABOLIC PANEL
Anion gap: 8 (ref 5–15)
BUN: 11 mg/dL (ref 8–23)
CO2: 25 mmol/L (ref 22–32)
Calcium: 9.3 mg/dL (ref 8.9–10.3)
Chloride: 104 mmol/L (ref 98–111)
Creatinine, Ser: 0.61 mg/dL (ref 0.44–1.00)
GFR, Estimated: >60 mL/min (ref >60)
Glucose, Bld: 104 mg/dL — ABNORMAL HIGH (ref 70–99)
Potassium: 3.5 mmol/L (ref 3.5–5.1)
Sodium: 137 mmol/L (ref 135–145)

## 2023-07-02 LAB — PROTIME-INR
INR: 1.1 (ref 0.8–1.2)
Prothrombin Time: 14 s (ref 11.4–15.2)

## 2023-07-02 LAB — GLUCOSE, CAPILLARY
Glucose-Capillary: 97 mg/dL (ref 70–99)
Glucose-Capillary: 109 mg/dL — ABNORMAL HIGH (ref 70–99)

## 2023-07-02 LAB — LACTATE DEHYDROGENASE, PLEURAL OR PERITONEAL FLUID: LD, Fluid: 508 U/L — ABNORMAL HIGH (ref 3–23)

## 2023-07-02 MED ORDER — WARFARIN SODIUM 5 MG PO TABS
7.5000 mg | ORAL_TABLET | Freq: Once | ORAL | Status: AC
  Administered 2023-07-02: 7.5 mg via ORAL
  Filled 2023-07-02: qty 1

## 2023-07-02 MED ORDER — LIDOCAINE HCL 1 % IJ SOLN
INTRAMUSCULAR | Status: AC
  Filled 2023-07-02: qty 20

## 2023-07-02 NOTE — Progress Notes (Signed)
 PHARMACY - ANTICOAGULATION CONSULT NOTE  Pharmacy Consult for warfarin Indication: atrial fibrillation  Allergies  Allergen Reactions   Crestor [Rosuvastatin Calcium] Swelling and Other (See Comments)    Swelling of legs and muscle weakness   Simvastatin Swelling and Other (See Comments)    Swelling of legs and muscle weakness    Patient Measurements: Height: 5\' 2"  (157.5 cm) Weight: 82.5 kg (181 lb 12.8 oz) IBW/kg (Calculated) : 50.1  Vital Signs: Temp: 98.5 F (36.9 C) (02/27 0419) Temp Source: Oral (02/27 0419) BP: 116/73 (02/27 0419) Pulse Rate: 74 (02/27 0419)  Labs: Recent Labs    06/29/23 0955 06/30/23 0417 07/01/23 1204 07/01/23 1231 07/02/23 0529  HGB 10.8* 10.2* 10.6*  --  10.3*  HCT 32.8* 30.6* 32.1*  --  30.6*  PLT 168 153 163  --  159  LABPROT  --   --   --  13.7 14.0  INR  --   --   --  1.0 1.1  CREATININE 0.50 0.63 0.79  --  0.61  TROPONINIHS 3  --   --   --   --     Estimated Creatinine Clearance: 62.4 mL/min (by C-G formula based on SCr of 0.61 mg/dL).   Medical History: Past Medical History:  Diagnosis Date   Colitis    Endometrial cancer (HCC)    History of radiation therapy    Vagina-04/20/23-05/11/23- Dr. Antony Blackbird   Hyperlipidemia    Hypertension    Malnutrition (HCC)    Stroke [facility]) 2019   balance still affected    Medications:  No prior to admission anticoagulant meds listed although patient reported being on apixaban in the past Currently on aspirin  81 mg daily and enoxaparin 40 mg subcutaneous daily for VTE prophylaxis, last dose enoxaparin 2/25 2110  Assessment: Pharmacy consulted to dose warfarin for this 73 yo female with new onset atrial fibrillation and unable to affort a direct oral anticoagulant (DOAC).    CHA2DS2-VASc risk score: 5 (female, age, HTN, History of stroke). [facility] Coumadin Dosing Nomogram & Total Points: 5  Today, 07/02/23: INR remains subtherapeutic, as expected, at 1.1 Given warfarin takes  ~48hrs to fully impact INR, will give the same dose this evening. Hgb 10.3, plts 159--stable No s/sx of bleeding, per RN  Goal of Therapy:  Reduce risk of stroke and systemic embolism in patients with afib Monitor platelets by anticoagulation protocol: Yes INR: 2-3   Plan:  Warfarin 7.5 mg po x 1 today Per Cardiology,  Bridge with enoxaparin at approximately 1mg /kg subcutaneous q12hr until INR within goal INR 2-3 Aspirin discontinued Monitor daily PT/INR for warfarin dose & adjustment, CBC, signs/symptoms of bleeding   Thank you for allowing pharmacy to be a part of this patient's care.  Cherylin Mylar, PharmD Clinical Pharmacist  2/27/20258:04 AM

## 2023-07-02 NOTE — Procedures (Signed)
 PROCEDURE SUMMARY:  Successful US guided right thoracentesis. Yielded 770 mL of dark red, thin fluid. Patient tolerated procedure well. No immediate complications. EBL = trace  Specimen was sent for labs.  Post procedure chest X-ray reveals no pneumothorax  Loman Brooklyn PA-C 07/02/2023 2:40 PM

## 2023-07-02 NOTE — Progress Notes (Signed)
 PROGRESS NOTE Maria Avila  WUJ:[id number] DOB: 24-Apr-1951 DOA: 06/29/2023 PCP: Merri Brunette, MD   Brief Narrative/Hospital Course: 73 y.o. 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 She had a scheduled CT scan in the outpatient and after the scan, she was advised to present to the ED 2/24. In the ED, noted to be on 2 L [place] with SpO2 93 to 97%.  Labs show stable pancytopenia, normal CMP, normal BNP, normal troponin, negative COVID, RSV and flu test. EKG>sinus rhythm. CXR>moderate right pleural effusion with right lung base compressive atelectasis versus pneumonia. CTA chest PE study> with no evidence of PE but shows large right pleural effusion and significant right lung atelectasis. Patient admitted for further management     Subjective: Denies any new complaints this a.m.  Saturating well on room air, denies any shortness of breath, chest pain.     Assessment and Plan: Principal Problem:   Pleural effusion, right Active Problems:   Chronic anemia   Metastasis from cancer of uterus (HCC)   SOB (shortness of breath)   Hypomagnesemia   Assessment and Plan:  Symptomatic large right pleural effusion Unknown etiology for now, history of endometrial CA S/p thoracentesis with 1 L fluid drainage on 2/25 (unfortunately sample analysis was not ordered), so a repeat thoracentesis (discussed with Oncology Dr Bertis Ruddy) with 750 dark red fluid drainage done on 2/27, sample analysis was sent Repeat recent echo showed preserved EF  Endometrial cancer Stage IVb high-grade uterine serous carcinoma now status post chemotherapy, surgery, radiation currently on adjuvant immunotherapy with trastuzumab Patient's recent imaging shows excellent response to  therapy with no signs of residual disease. Recent echo also showed preserved ejection fraction Outpatient follow-up   Hypomagnesemia Hypokalemia Replace as needed  Hypertension Soft bp, Holding home meds   New onset A-fib with RVR Likely paroxysmal, current rate controlled Cardiology consulted, appreciate recs, start warfarin and as needed rate controlling meds as BP is currently soft   HLD Hx of CVA Has statin intolerance, previously on GTI.   Generalized weakness PT/OT eval and treat  Class I Obesity: Patient's Body mass index is 33.25 kg/m.  Lifestyle modification advised    DVT prophylaxis:  Code Status:   Code Status: Full Code Family Communication: None at bedside Patient status is: Remains hospitalized because of severity of illness Level of care: Telemetry   Dispo: The patient is from: HOME W/ HUSBAND            Anticipated disposition: TBD Objective: Vitals last 24 hrs: Vitals:   07/02/23 0419 07/02/23 0956 07/02/23 1232 07/02/23 1408  BP: 116/73  125/74 128/67  Pulse: 74 96 68   Resp: 18  16   Temp: 98.5 F (36.9 C)  98 F (36.7 C)   TempSrc: Oral  Oral   SpO2: 93%  95%   Weight:      Height:       Weight change:   Physical Examination: General: NAD  Cardiovascular: S1, S2 present Respiratory: CTAB, diminished breath sounds on the right Abdomen: Soft, nontender, nondistended, bowel sounds present Musculoskeletal: No bilateral pedal edema noted Skin: Normal Psychiatry: Normal mood   Medications reviewed:  Scheduled Meds:  Chlorhexidine Gluconate Cloth  6 each Topical Daily   enoxaparin (LOVENOX) injection  80 mg Subcutaneous Q12H   multivitamin with minerals  1 tablet Oral Daily   thiamine  100 mg Oral Daily   warfarin  7.5 mg Oral ONCE-1600   Warfarin - Pharmacist Dosing Inpatient   Does not apply q1600  Continuous Infusions:   Diet Order             Diet regular Room service appropriate? Yes; Fluid consistency: Thin  Diet  effective now                  Intake/Output Summary (Last 24 hours) at 07/02/2023 1604 Last data filed at 07/02/2023 1232 Gross per 24 hour  Intake 480 ml  Output --  Net 480 ml    Net IO Since Admission: 1,530 mL [07/02/23 1604]  Wt Readings from Last 3 Encounters:  06/29/23 82.5 kg  06/19/23 82.5 kg  06/11/23 81.9 kg     Unresulted Labs (From admission, onward)     Start     Ordered   07/02/23 1417  Lactate dehydrogenase (pleural or peritoneal fluid)  RELEASE UPON ORDERING,   TIMED        07/02/23 1417   07/02/23 1417  Body fluid cell count with differential  RELEASE UPON ORDERING,   TIMED        07/02/23 1417   07/02/23 1417  Protein, pleural or peritoneal fluid  RELEASE UPON ORDERING,   TIMED        07/02/23 1417   07/02/23 1417  Acid Fast Culture with reflexed sensitivities  RELEASE UPON ORDERING,   TIMED        07/02/23 1417   07/02/23 1417  Acid Fast Smear (AFB)  RELEASE UPON ORDERING,   TIMED        07/02/23 1417   07/02/23 1417  Body fluid culture w Gram Stain  RELEASE UPON ORDERING,   TIMED        07/02/23 1417   07/01/23 1244  Protime-INR  Daily,   R      07/01/23 1243   07/01/23 0854  CBC with Differential/Platelet  Daily,   R      07/01/23 0853   07/01/23 0854  Basic metabolic panel  Daily,   R      07/01/23 0853   07/01/23 0845  Protein, pleural or peritoneal fluid  Add-on,   AD        07/01/23 0844   07/01/23 0842  Lactate dehydrogenase (pleural or peritoneal fluid)  Add-on,   AD        07/01/23 0844          Data Reviewed: I have personally reviewed following labs and imaging studies CBC: Recent Labs  Lab 06/29/23 0955 06/30/23 0417 07/01/23 1204 07/02/23 0529  WBC 4.6 4.2 4.7 4.5  NEUTROABS  --   --  2.7 2.0  HGB 10.8* 10.2* 10.6* 10.3*  HCT 32.8* 30.6* 32.1* 30.6*  MCV 104.1* 104.4* 104.9* 104.1*  PLT 168 153 163 159   Basic Metabolic Panel:  Recent Labs  Lab 06/29/23 0955 06/29/23 2015 06/30/23 0417 07/01/23 1204  07/02/23 0529  NA 138  --  138 139 137  K 3.8  --  3.3* 3.4* 3.5  CL 103  --  104 102 104  CO2 26  --  25 26 25   GLUCOSE 98  --  96 120* 104*  BUN 12  --  9 14 11   CREATININE 0.50  --  0.63 0.79 0.61  CALCIUM 9.6  --  9.0 9.5 9.3  MG  --  1.5* 2.1  --   --  PHOS  --  3.1  --   --   --    GFR: Estimated Creatinine Clearance: 62.4 mL/min (by C-G formula based on SCr of 0.61 mg/dL). Liver Function Tests:  Recent Labs  Lab 06/29/23 0955 07/01/23 1206  AST 18  --   ALT 11  --   ALKPHOS 49  --   BILITOT 0.6  --   PROT 7.6 7.0  ALBUMIN 4.0  --    Recent Labs    06/29/23 2015  TSH 1.606  Sepsis Labs: No results for input(s): "PROCALCITON", "LATICACIDVEN" in the last 168 hours. Recent Results (from the past 240 hours)  Resp panel by RT-PCR (RSV, Flu A&B, Covid) Anterior Nasal Swab     Status: None   Collection Time: 06/29/23  9:59 AM   Specimen: Anterior Nasal Swab  Result Value Ref Range Status   SARS Coronavirus 2 by RT PCR NEGATIVE NEGATIVE Final    Comment: (NOTE) SARS-CoV-2 target nucleic acids are NOT DETECTED.  The SARS-CoV-2 RNA is generally detectable in upper respiratory specimens during the acute phase of infection. The lowest concentration of SARS-CoV-2 viral copies this assay can detect is 138 copies/mL. A negative result does not preclude SARS-Cov-2 infection and should not be used as the sole basis for treatment or other patient management decisions. A negative result may occur with  improper specimen collection/handling, submission of specimen other than nasopharyngeal swab, presence of viral mutation(s) within the areas targeted by this assay, and inadequate number of viral copies(<138 copies/mL). A negative result must be combined with clinical observations, patient history, and epidemiological information. The expected result is Negative.  Fact Sheet for Patients:  BloggerCourse.com  Fact Sheet for Healthcare Providers:   SeriousBroker.it  This test is no t yet approved or cleared by the [place] FDA and  has been authorized for detection and/or diagnosis of SARS-CoV-2 by FDA under an Emergency Use Authorization (EUA). This EUA will remain  in effect (meaning this test can be used) for the duration of the COVID-19 declaration under Section 564(b)(1) of the Act, 21 U.S.C.section 360bbb-3(b)(1), unless the authorization is terminated  or revoked sooner.       Influenza A by PCR NEGATIVE NEGATIVE Final   Influenza B by PCR NEGATIVE NEGATIVE Final    Comment: (NOTE) The Xpert Xpress SARS-CoV-2/FLU/RSV plus assay is intended as an aid in the diagnosis of influenza from Nasopharyngeal swab specimens and should not be used as a sole basis for treatment. Nasal washings and aspirates are unacceptable for Xpert Xpress SARS-CoV-2/FLU/RSV testing.  Fact Sheet for Patients: BloggerCourse.com  Fact Sheet for Healthcare Providers: SeriousBroker.it  This test is not yet approved or cleared by the [place] FDA and has been authorized for detection and/or diagnosis of SARS-CoV-2 by FDA under an Emergency Use Authorization (EUA). This EUA will remain in effect (meaning this test can be used) for the duration of the COVID-19 declaration under Section 564(b)(1) of the Act, 21 U.S.C. section 360bbb-3(b)(1), unless the authorization is terminated or revoked.     Resp Syncytial Virus by PCR NEGATIVE NEGATIVE Final    Comment: (NOTE) Fact Sheet for Patients: BloggerCourse.com  Fact Sheet for Healthcare Providers: SeriousBroker.it  This test is not yet approved or cleared by the [place] FDA and has been authorized for detection and/or diagnosis of SARS-CoV-2 by FDA under an Emergency Use Authorization (EUA). This EUA will remain in effect (meaning this test can be used) for  the duration of the COVID-19 declaration under  Section 564(b)(1) of the Act, 21 U.S.C. section 360bbb-3(b)(1), unless the authorization is terminated or revoked.  Performed at [facility], [address], [place], [place] [address]   Antimicrobials/Microbiology: Anti-infectives (From admission, onward)    None         Component Value Date/Time   SDES  04/09/2023 1052    URINE, CLEAN CATCH Performed at [facility], [address] [place], [place] [address]    SPECREQUEST  04/09/2023 1052    NONE Performed at [facility], [address] [place], [place] [address]    CULT 50,000 COLONIES/mL ESCHERICHIA COLI (A) 04/09/2023 1052   REPTSTATUS 04/11/2023 FINAL 04/09/2023 1052     Radiology Studies: ECHOCARDIOGRAM LIMITED Result Date: 07/02/2023    ECHOCARDIOGRAM LIMITED REPORT   Patient Name:   Maria Avila Date of Exam: 07/02/2023 Medical Rec #:  [id number]     Height:       62.0 in Accession #:    [id number]    Weight:       181.8 lb Date of Birth:  Feb 08, 1951     BSA:          1.836 m Patient Age:    72 years      BP:           116/73 mmHg Patient Gender: F             HR:           71 bpm. Exam Location:  Inpatient Procedure: Limited Echo, Cardiac Doppler and Color Doppler (Both Spectral and            Color Flow Doppler were utilized during procedure). Indications:    Chemo, Abnormal ECG  History:        Patient has no prior history of Echocardiogram examinations.                 Risk Factors:Hypertension, Diabetes, Dyslipidemia and Former                 Smoker.  Sonographer:    Karma Ganja Referring Phys: [id number] Warrick Parisian J Kimon Loewen IMPRESSIONS  1. Left ventricular ejection fraction, by estimation, is 65 to 70%. The left ventricle has normal function.  2. Right ventricular systolic function is normal. The right ventricular size is normal.  3. No evidence of mitral valve regurgitation.  4. The aortic valve is  tricuspid. Aortic valve regurgitation is not visualized. No aortic stenosis is present.  5. The inferior vena cava is normal in size with greater than 50% respiratory variability, suggesting right atrial pressure of 3 mmHg. Comparison(s): No significant change from prior study. Prior images reviewed side by side. FINDINGS  Left Ventricle: Left ventricular ejection fraction, by estimation, is 65 to 70%. The left ventricle has normal function. Strain imaging was not performed. The left ventricular internal cavity size was normal in size. There is no left ventricular hypertrophy. Right Ventricle: The right ventricular size is normal. Right ventricular systolic function is normal. Pericardium: Presence of epicardial fat layer. Aortic Valve: The aortic valve is tricuspid. Aortic valve regurgitation is not visualized. No aortic stenosis is present. Pulmonic Valve: The pulmonic valve was grossly normal. Pulmonic valve regurgitation is not visualized. Venous: The inferior vena cava is normal in size with greater than 50% respiratory variability, suggesting right atrial pressure of 3 mmHg. IAS/Shunts: The atrial septum is grossly normal. Additional Comments: 3D imaging was not performed. Spectral Doppler performed. Color Doppler  performed.  LEFT VENTRICLE PLAX 2D LVIDd:         4.70 cm LVIDs:         3.00 cm LV PW:         0.80 cm LV IVS:        0.75 cm LVOT diam:     2.00 cm LVOT Area:     3.14 cm  IVC IVC diam: 1.60 cm LEFT ATRIUM         Index LA diam:    2.70 cm 1.47 cm/m   SHUNTS Systemic Diam: 2.00 cm Riley Lam MD Electronically signed by Riley Lam MD Signature Date/Time: 07/02/2023/4:01:03 PM    Final    US THORACENTESIS ASP PLEURAL SPACE W/IMG GUIDE Result Date: 07/02/2023 INDICATION: 73 year old female with uterine cancer and right pleural effusion. EXAM: ULTRASOUND GUIDED RIGHT THORACENTESIS MEDICATIONS: 12 mL 1% lidocaine COMPLICATIONS: None immediate. PROCEDURE: An ultrasound guided  thoracentesis was thoroughly discussed with the patient and questions answered. The benefits, risks, alternatives and complications were also discussed. The patient understands and wishes to proceed with the procedure. Written consent was obtained. Ultrasound was performed to localize and mark an adequate pocket of fluid in the right chest. The area was then prepped and draped in the normal sterile fashion. 1% Lidocaine was used for local anesthesia. Under ultrasound guidance a 8 Fr Safe-T-Centesis catheter was introduced. Thoracentesis was performed. The catheter was removed and a dressing applied. FINDINGS: A total of approximately 770 mL of dark red, thin fluid was removed. Samples were sent to the laboratory as requested by the clinical team. IMPRESSION: Successful ultrasound guided right thoracentesis yielding 770 mL of pleural fluid. Performed By Theresa Mulligan, PA-C Electronically Signed   By: Gilmer Mor D.O.   On: 07/02/2023 15:23   LOS: 0 days    Briant Cedar, MD  [facility]  07/02/2023, 4:04 PM

## 2023-07-02 NOTE — Progress Notes (Signed)
  Echocardiogram 2D Echocardiogram has been performed.  Maria Avila 07/02/2023, 3:22 PM

## 2023-07-02 NOTE — Progress Notes (Signed)
 Physical Therapy Treatment Patient Details Name: Maria Avila MRN: [id number] DOB: 1950/06/24 Today's Date: 07/02/2023   History of Present Illness Patient is a 73 year old female who presented on 2/24 with shortness of breath. Patient had outpatient CT scan revealing fluid in chest resulting in being sent to ER. s/p thoracentesis 06/30/23.  PMH: metastasis from cancer of uterus, chronic anemia    PT Comments  Pt ambulated in hallway good distance with [facility] however mostly just holding SPC.  Pt also performed some exercises in recliner.  Continue to recommend HHPT upon d/c.     If plan is discharge home, recommend the following: A little help with walking and/or transfers;A little help with bathing/dressing/bathroom;Assistance with cooking/housework;Assist for transportation;Help with stairs or ramp for entrance   Can travel by private vehicle        Equipment Recommendations  Rolling walker (2 wheels)    Recommendations for Other Services       Precautions / Restrictions Precautions Precautions: None     Mobility  Bed Mobility Overal bed mobility: Modified Independent                  Transfers Overall transfer level: Needs assistance Equipment used: None Transfers: Sit to/from Stand Sit to Stand: Supervision                Ambulation/Gait Ambulation/Gait assistance: Supervision Gait Distance (Feet): 280 Feet Assistive device: Straight cane Gait Pattern/deviations: Step-through pattern, Decreased stride length Gait velocity: decr     General Gait Details: slow but steady, pt with SPC however mostly carrying along and not required for support/balance, denies dyspnea, distance to tolerance   Stairs             Wheelchair Mobility     Tilt Bed    Modified Rankin (Stroke Patients Only)       Balance                                            Communication Communication Communication: No apparent difficulties   Cognition Arousal: Alert Behavior During Therapy: WFL for tasks assessed/performed   PT - Cognitive impairments: No apparent impairments                         Following commands: Intact      Cueing    Exercises General Exercises - Lower Extremity Ankle Circles/Pumps: AROM, 10 reps, Both, Seated Long Arc Quad: AROM, Both, 10 reps, Seated Hip Flexion/Marching: AROM, 10 reps, Seated, Both    General Comments        Pertinent Vitals/Pain Pain Assessment Pain Assessment: No/denies pain Pain Intervention(s): Monitored during session, Repositioned    Home Living                          Prior Function            PT Goals (current goals can now be found in the care plan section) Progress towards PT goals: Progressing toward goals    Frequency    Min 1X/week      PT Plan      Co-evaluation              AM-PAC PT "6 Clicks" Mobility   Outcome Measure  Help needed turning from your back to your side while in  a flat bed without using bedrails?: A Little Help needed moving from lying on your back to sitting on the side of a flat bed without using bedrails?: A Little Help needed moving to and from a bed to a chair (including a wheelchair)?: A Little Help needed standing up from a chair using your arms (e.g., wheelchair or bedside chair)?: A Little Help needed to walk in hospital room?: A Little Help needed climbing 3-5 steps with a railing? : A Little 6 Click Score: 18    End of Session Equipment Utilized During Treatment: Gait belt Activity Tolerance: Patient tolerated treatment well Patient left: with call bell/phone within reach;in chair   PT Visit Diagnosis: Difficulty in walking, not elsewhere classified (R26.2)     Time: 9528-4132 PT Time Calculation (min) (ACUTE ONLY): 11 min  Charges:    $Gait Training: 8-22 mins PT General Charges $$ ACUTE PT VISIT: 1 Visit                    Paulino Door, DPT Physical Therapist Acute  Rehabilitation Services Office: [phone]   Kati L Payson 07/02/2023, 1:02 PM

## 2023-07-02 NOTE — Discharge Instructions (Signed)
 Information on my medicine - Coumadin   (Warfarin)  This medication education was reviewed with me or my healthcare representative as part of my discharge preparation.  The pharmacist that spoke with me during my hospital stay was:  Cherylin Mylar, [facility] and Davina Poke, pharmacy student.  Why was Coumadin prescribed for you? Coumadin was prescribed for you because you have a blood clot or a medical condition that can cause an increased risk of forming blood clots. Blood clots can cause serious health problems by blocking the flow of blood to the heart, lung, or brain. Coumadin can prevent harmful blood clots from forming. As a reminder your indication for Coumadin is:  Stroke Prevention because of Atrial Fibrillation  What test will check on my response to Coumadin? While on Coumadin (warfarin) you will need to have an INR test regularly to ensure that your dose is keeping you in the desired range. The INR (international normalized ratio) number is calculated from the result of the laboratory test called prothrombin time (PT).  If an INR APPOINTMENT HAS NOT ALREADY BEEN MADE FOR YOU please schedule an appointment to have this lab work done by your health care provider within 7 days. Your INR goal is usually a number between:  2 to 3 or your provider may give you a more narrow range like 2-2.5.  Ask your health care provider during an office visit what your goal INR is.  What  do you need to  know  About  COUMADIN? Take Coumadin (warfarin) exactly as prescribed by your healthcare provider about the same time each day.  DO NOT stop taking without talking to the doctor who prescribed the medication.  Stopping without other blood clot prevention medication to take the place of Coumadin may increase your risk of developing a new clot or stroke.  Get refills before you run out.  What do you do if you miss a dose? If you miss a dose, take it as soon as you remember on the same day then continue your  regularly scheduled regimen the next day.  Do not take two doses of Coumadin at the same time.  Important Safety Information A possible side effect of Coumadin (Warfarin) is an increased risk of bleeding. You should call your healthcare provider right away if you experience any of the following: Bleeding from an injury or your nose that does not stop. Unusual colored urine (red or dark brown) or unusual colored stools (red or black). Unusual bruising for unknown reasons. A serious fall or if you hit your head (even if there is no bleeding).  Some foods or medicines interact with Coumadin (warfarin) and might alter your response to warfarin. To help avoid this: Eat a balanced diet, maintaining a consistent amount of Vitamin K. Notify your provider about major diet changes you plan to make. Avoid alcohol or limit your intake to 1 drink for women and 2 drinks for men per day. (1 drink is 5 oz. wine, 12 oz. beer, or 1.5 oz. liquor.)  Make sure that ANY health care provider who prescribes medication for you knows that you are taking Coumadin (warfarin).  Also make sure the healthcare provider who is monitoring your Coumadin knows when you have started a new medication including herbals and non-prescription products.  Coumadin (Warfarin)  Major Drug Interactions  Increased Warfarin Effect Decreased Warfarin Effect  Alcohol (large quantities) Antibiotics (esp. Septra/Bactrim, Flagyl, Cipro) Amiodarone (Cordarone) Aspirin (ASA) Cimetidine (Tagamet) Megestrol (Megace) NSAIDs (ibuprofen, naproxen, etc.) Piroxicam (Feldene)  Propafenone (Rythmol SR) Propranolol (Inderal) Isoniazid (INH) Posaconazole (Noxafil) Barbiturates (Phenobarbital) Carbamazepine (Tegretol) Chlordiazepoxide (Librium) Cholestyramine (Questran) Griseofulvin Oral Contraceptives Rifampin Sucralfate (Carafate) Vitamin K   Coumadin (Warfarin) Major Herbal Interactions  Increased Warfarin Effect Decreased Warfarin  Effect  Garlic Ginseng Ginkgo biloba Coenzyme Q10 Green tea St. John's wort    Coumadin (Warfarin) FOOD Interactions  Eat a consistent number of servings per week of foods HIGH in Vitamin K (1 serving =  cup)  Collards (cooked, or boiled & drained) Kale (cooked, or boiled & drained) Mustard greens (cooked, or boiled & drained) Parsley *serving size only =  cup Spinach (cooked, or boiled & drained) Swiss chard (cooked, or boiled & drained) Turnip greens (cooked, or boiled & drained)  Eat a consistent number of servings per week of foods MEDIUM-HIGH in Vitamin K (1 serving = 1 cup)  Asparagus (cooked, or boiled & drained) Broccoli (cooked, boiled & drained, or raw & chopped) Brussel sprouts (cooked, or boiled & drained) *serving size only =  cup Lettuce, raw (green leaf, endive, romaine) Spinach, raw Turnip greens, raw & chopped   These websites have more information on Coumadin (warfarin):  http://www.king-russell.com/; https://www.hines.net/;

## 2023-07-02 NOTE — Progress Notes (Signed)
 Occupational Therapy Treatment Patient Details Name: Maria Avila MRN: [id number] DOB: [birth year] Today's Date: 07/02/2023   History of present illness Patient is a 73 year old female who presented on 2/24 with shortness of breath. Patient had outpatient CT scan revealing fluid in chest resulting in being sent to ER. s/p thoracentesis 06/30/23.  PMH: metastasis from cancer of uterus, chronic anemia,   OT comments  Patient treated  by Occupational Therapy with no further acute OT needs identified. All education has been completed and the patient has no further questions.  See below for any follow-up Occupational Therapy or equipment needs. Acute OT is signing off. Will defer to [facility] OT to progress pt with more dynamic IADLs within her own home environment.  Thank you for this referral.       If plan is discharge home, recommend the following:  Assistance with cooking/housework   Equipment Recommendations  None recommended by OT    Recommendations for Other Services      Precautions / Restrictions Restrictions Weight Bearing Restrictions Per Provider Order: No       Mobility Bed Mobility Overal bed mobility: Modified Independent (HOB elevated. No use of rails.)                  Transfers                         Balance Overall balance assessment: Mild deficits observed, not formally tested                                         ADL either performed or assessed with clinical judgement   ADL Overall ADL's : At baseline     Grooming: Standing;Independent   Upper Body Bathing: Sitting;Independent   Lower Body Bathing: Sitting/lateral leans;Sit to/from stand Lower Body Bathing Details (indicate cue type and reason): Supervision recommended for safety. Discussed integration of shower chair and/or long handled bath brush/sponge for energy conservation. Upper Body Dressing : Independent   Lower Body Dressing: Independent;Sitting/lateral  leans;Sit to/from stand Lower Body Dressing Details (indicate cue type and reason): Socks at EOB and gown management in standing. Toilet Transfer: Clinical research associate;Ambulation Toilet Transfer Details (indicate cue type and reason): Did not require use of grab bar or cane to ambulate to bathroom. Toileting- Clothing Manipulation and Hygiene: Independent Toileting - Clothing Manipulation Details (indicate cue type and reason): Pt able to simulate thorough hygiene in toilet. Pt reports that she has been going to [facility] herself and performing own hygiene without issue since yesterday as pt began feeling better.     Functional mobility during ADLs: Independent General ADL Comments: Pt reports that her husband performs most IADLs at baseline and pt verbalized no concerns of her abilities when returning home.    Extremity/Trunk Assessment Upper Extremity Assessment Upper Extremity Assessment: Overall WFL for tasks assessed   Lower Extremity Assessment Lower Extremity Assessment: Overall WFL for tasks assessed   Cervical / Trunk Assessment Cervical / Trunk Assessment: Normal;Other exceptions Cervical / Trunk Exceptions: Keeps a wide BOS with ambulation, likely compensatory due to body habitus.    Vision   Vision Assessment?: No apparent visual deficits   Perception     Praxis     Communication Communication Communication: No apparent difficulties   Cognition Arousal: Alert Behavior During Therapy: WFL for tasks assessed/performed Cognition: No apparent impairments  Cueing      Exercises Other Exercises Other Exercises: Pt given energy conservation handouts and education on main principles and how to use pacing and the RPE to help judge frequency and length of rest breaks. Pt verbazlied understanding to all concepts.    Shoulder Instructions       General Comments      Pertinent Vitals/ Pain       Pain  Assessment Pain Assessment: 0-10 Pain Score: 2  Pain Location: R side of lateral chest. Pt reports very mild. Reports another possible thoracentesis with expectation of feeling even better after that. Pain Intervention(s): Monitored during session  Home Living                                          Prior Functioning/Environment              Frequency           Progress Toward Goals  OT Goals(current goals can now be found in the care plan section)  Progress towards OT goals: Goals met/education completed, patient discharged from OT (defer to [facility] OT to progress pt with IADLs as appropraite.)     Plan      Co-evaluation                 AM-PAC OT "6 Clicks" Daily Activity     Outcome Measure   Help from another person eating meals?: None Help from another person taking care of personal grooming?: None Help from another person toileting, which includes using toliet, bedpan, or urinal?: None Help from another person bathing (including washing, rinsing, drying)?: None (supervision and shower chair recommended for energy conservation and safety) Help from another person to put on and taking off regular upper body clothing?: None Help from another person to put on and taking off regular lower body clothing?: None 6 Click Score: 24    End of Session Equipment Utilized During Treatment: Other (comment) (none)  OT Visit Diagnosis:  (needs assistance with IADLs)   Activity Tolerance Patient tolerated treatment well   Patient Left with call bell/phone within reach (Sitting EOB)   Nurse Communication          Time: 1610-9604 OT Time Calculation (min): 13 min  Charges: OT General Charges $OT Visit: 1 Visit OT Treatments $Self Care/Home Management : 8-22 mins  Victorino Dike, OT Acute Rehab Services Office: [phone] 07/02/2023   Theodoro Clock 07/02/2023, 10:04 AM

## 2023-07-02 NOTE — Progress Notes (Signed)
 I saw her briefly She has mild pain after thoracentesis I addressed all her questions Will keep appointment next Friday as scheduled No charge

## 2023-07-02 NOTE — Plan of Care (Signed)
  Problem: Education: Goal: Knowledge of General Education information will improve Description: Including pain rating scale, medication(s)/side effects and non-pharmacologic comfort measures Outcome: Progressing   Problem: Coping: Goal: Level of anxiety will decrease Outcome: Progressing   Problem: Elimination: Goal: Will not experience complications related to bowel motility Outcome: Progressing   Problem: Pain Managment: Goal: General experience of comfort will improve and/or be controlled Outcome: Progressing   Problem: Safety: Goal: Ability to remain free from injury will improve Outcome: Progressing

## 2023-07-03 ENCOUNTER — Other Ambulatory Visit (HOSPITAL_COMMUNITY): Payer: Self-pay

## 2023-07-03 DIAGNOSIS — J90 Pleural effusion, not elsewhere classified: Secondary | ICD-10-CM | POA: Diagnosis not present

## 2023-07-03 LAB — BASIC METABOLIC PANEL
Anion gap: 7 (ref 5–15)
BUN: 13 mg/dL (ref 8–23)
CO2: 27 mmol/L (ref 22–32)
Calcium: 9 mg/dL (ref 8.9–10.3)
Chloride: 104 mmol/L (ref 98–111)
Creatinine, Ser: 0.6 mg/dL (ref 0.44–1.00)
GFR, Estimated: >60 mL/min (ref >60)
Glucose, Bld: 92 mg/dL (ref 70–99)
Potassium: 3.7 mmol/L (ref 3.5–5.1)
Sodium: 138 mmol/L (ref 135–145)

## 2023-07-03 LAB — CBC WITH DIFFERENTIAL/PLATELET
Abs Immature Granulocytes: 0 10*3/uL (ref 0.00–0.07)
Basophils Absolute: 0 10*3/uL (ref 0.0–0.1)
Basophils Relative: 0 %
Eosinophils Absolute: 0.1 10*3/uL (ref 0.0–0.5)
Eosinophils Relative: 3 %
HCT: 30 % — ABNORMAL LOW (ref 36.0–46.0)
Hemoglobin: 9.9 g/dL — ABNORMAL LOW (ref 12.0–15.0)
Immature Granulocytes: 0 %
Lymphocytes Relative: 50 %
Lymphs Abs: 2 10*3/uL (ref 0.7–4.0)
MCH: 35 pg — ABNORMAL HIGH (ref 26.0–34.0)
MCHC: 33 g/dL (ref 30.0–36.0)
MCV: 106 fL — ABNORMAL HIGH (ref 80.0–100.0)
Monocytes Absolute: 0.3 10*3/uL (ref 0.1–1.0)
Monocytes Relative: 8 %
Neutro Abs: 1.6 10*3/uL — ABNORMAL LOW (ref 1.7–7.7)
Neutrophils Relative %: 39 %
Platelets: 166 10*3/uL (ref 150–400)
RBC: 2.83 MIL/uL — ABNORMAL LOW (ref 3.87–5.11)
RDW: 12.3 % (ref 11.5–15.5)
WBC: 4 10*3/uL (ref 4.0–10.5)
nRBC: 0 % (ref 0.0–0.2)

## 2023-07-03 LAB — PROTIME-INR
INR: 1.1 (ref 0.8–1.2)
Prothrombin Time: 14.9 s (ref 11.4–15.2)

## 2023-07-03 MED ORDER — HEPARIN SOD (PORK) LOCK FLUSH 100 UNIT/ML IV SOLN
500.0000 [IU] | Freq: Once | INTRAVENOUS | Status: AC
  Administered 2023-07-03: 500 [IU] via INTRAVENOUS
  Filled 2023-07-03: qty 5

## 2023-07-03 MED ORDER — WARFARIN SODIUM 5 MG PO TABS
7.5000 mg | ORAL_TABLET | Freq: Every day | ORAL | 0 refills | Status: DC
  Filled 2023-07-03: qty 45, 30d supply, fill #0

## 2023-07-03 MED ORDER — ENOXAPARIN SODIUM 80 MG/0.8ML IJ SOSY
80.0000 mg | PREFILLED_SYRINGE | Freq: Two times a day (BID) | INTRAMUSCULAR | 0 refills | Status: DC
  Filled 2023-07-03: qty 22.4, 14d supply, fill #0

## 2023-07-03 MED ORDER — WARFARIN SODIUM 5 MG PO TABS: 10.0000 mg | ORAL_TABLET | Freq: Once | ORAL | Status: DC

## 2023-07-03 NOTE — Progress Notes (Signed)
 Central tele notified of pt d/c to home by this RN- removed from tele. AVS reviewed w/ pt & husband - both verbalized an understanding. Port to be deaccessed by primary RN- waiting on meds from [facility] pharmacy

## 2023-07-03 NOTE — Discharge Summary (Signed)
 Physician Discharge Summary   Patient: Maria Avila MRN: [id number] DOB: 07-Feb-1951  Admit date:     06/29/2023  Discharge date: 07/03/23  Discharge Physician: Briant Cedar   PCP: Merri Brunette, MD   Recommendations at discharge:    Follow up with PCP Follow up with cardiology Follow with oncology  Discharge Diagnoses: Principal Problem:   Pleural effusion, right Active Problems:   Chronic anemia   Metastasis from cancer of uterus (HCC)   SOB (shortness of breath)   Hypomagnesemia     Hospital Course: 73 y.o. 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 She had a scheduled CT scan in the outpatient and after the scan, she was advised to present to the ED 2/24. In the ED, noted to be on 2 L [place] with SpO2 93 to 97%.  Labs show stable pancytopenia, normal CMP, normal BNP, normal troponin, negative COVID, RSV and flu test. EKG>sinus rhythm. CXR>moderate right pleural effusion with right lung base compressive atelectasis versus pneumonia. CTA chest PE study> with no evidence of PE but shows large right pleural effusion and significant right lung atelectasis. Patient admitted for further management.    Today, denies any new complaints.  Discussed extensively with patient about the need to use Lovenox for bridging until Coumadin is therapeutic.  Pharmacy has educated patient on Coumadin use.  Cardiology PA was able to set up an appointment for patient and they anticoagulation clinic on 07/07/23 for monitoring of INR.  Patient to follow-up with oncology as scheduled as well as PCP and cardiology.   Assessment and Plan:  Symptomatic large right pleural effusion Unknown etiology for now, history of endometrial CA S/p thoracentesis with 1  L fluid drainage on 2/25 (unfortunately sample analysis was not ordered), so a repeat thoracentesis (discussed with Oncology Dr Bertis Ruddy) with 750 dark red fluid drainage done on 2/27, sample analysis was sent Repeat recent echo showed preserved EF   Endometrial cancer Stage IVb high-grade uterine serous carcinoma now status post chemotherapy, surgery, radiation currently on adjuvant immunotherapy with trastuzumab Patient's recent imaging shows excellent response to therapy with no signs of residual disease. Recent echo also showed preserved ejection fraction Outpatient follow-up   Hypomagnesemia Hypokalemia Replaced as needed   Hypertension BP stable Continue home regimen, PCP to adjust her BP   New onset A-fib with RVR Likely paroxysmal, current rate controlled Cardiology consulted, appreciate recs, started on warfarin (due to cost), continue 7.5 mg daily for now, until next INR check at Coumadin clinic for dose adjustment Bridge with therapeutic Lovenox twice daily for now Not on any rate controlling meds for now and will be added prn Coumadin clinic set up for INR monitoring   HLD Hx of CVA Has statin intolerance, previously on GTI.   Generalized weakness PT/OT eval and treat   Class I Obesity: Patient's Body mass index is 33.25 kg/m.  Lifestyle modification advised       Consultants: Oncology, IR Procedures performed: Thoracentesis x 2 Disposition: Home Diet recommendation:  Cardiac diet   DISCHARGE MEDICATION: Allergies as of 07/03/2023       Reactions   Crestor [rosuvastatin Calcium] Swelling, Other (See Comments)   Swelling of legs and muscle weakness   Simvastatin Swelling, Other (See Comments)   Swelling of legs and muscle weakness  Medication List     STOP taking these medications    aspirin EC 81 MG tablet       TAKE these medications    benazepril-hydrochlorthiazide 20-25 MG tablet Commonly known as: LOTENSIN HCT Take 1 tablet  by mouth daily.   [employer] Vitamin C 500 MG tablet Generic drug: ascorbic acid TAKE 1 TABLET BY MOUTH TWICE A DAY   enoxaparin 80 MG/0.8ML injection Commonly known as: LOVENOX Inject 0.8 mLs (80 mg total) into the skin every 12 (twelve) hours for 14 days.   lidocaine-prilocaine cream Commonly known as: EMLA Apply 1 Application topically daily as needed (prior to port access).   multivitamin capsule Take 1 capsule by mouth daily with breakfast.   ondansetron 8 MG tablet Commonly known as: ZOFRAN Take 1 tablet (8 mg total) by mouth every 8 (eight) hours as needed for nausea.   oxyCODONE 5 MG immediate release tablet Commonly known as: Oxy IR/ROXICODONE Take 1 tablet (5 mg total) by mouth every 6 (six) hours as needed for severe pain (pain score 7-10).   prochlorperazine 10 MG tablet Commonly known as: COMPAZINE Take 1 tablet (10 mg total) by mouth every 6 (six) hours as needed for nausea or vomiting.   sennosides-docusate sodium 8.6-50 MG tablet Commonly known as: SENOKOT-S Take 1 tablet by mouth 2 (two) times daily as needed for constipation.   thiamine 100 MG tablet Commonly known as: Vitamin B-1 TAKE 1 TABLET BY MOUTH EVERY DAY   Tylenol 8 Hour Arthritis Pain 650 MG CR tablet Generic drug: acetaminophen Take 650-1,300 mg by mouth every 8 (eight) hours as needed for pain.   warfarin 5 MG tablet Commonly known as: COUMADIN Take 1.5 tablets (7.5 mg total) by mouth daily at 4 PM.        Follow-up Information     Merri Brunette, MD. Schedule an appointment as soon as possible for a visit in 1 week(s).   Specialty: Internal Medicine Contact information: [address] [place] [address] [phone]                Discharge Exam: Ceasar Mons Weights   06/29/23 0853  Weight: 82.5 kg   General: NAD  Cardiovascular: S1, S2 present Respiratory: CTAB Abdomen: Soft, nontender, nondistended, bowel sounds present Musculoskeletal: No bilateral pedal  edema noted Skin: Normal Psychiatry: Normal mood   Condition at discharge: stable  The results of significant diagnostics from this hospitalization (including imaging, microbiology, ancillary and laboratory) are listed below for reference.   Imaging Studies: DG Chest 1 View Result Date: 07/02/2023 CLINICAL DATA:  Post thoracentesis.  770 mL fluid removed. EXAM: CHEST  1 VIEW COMPARISON:  06/30/2023 FINDINGS: Right central venous catheter with tip projecting over the cavoatrial junction region. Shallow inspiration with linear atelectasis in the lung bases. Significant decrease of right pleural effusion post thoracentesis with small residual effusion present. Tiny gas collection demonstrated in the right medial lung likely represents postprocedural change. No definitive evidence of significant pneumothorax. Heart size is normal. Calcification of the aorta. Degenerative changes in the spine and shoulder. IMPRESSION: Significant decrease of right pleural effusion post thoracentesis. No significant pneumothorax. Shallow inspiration with atelectasis in the lung bases. Electronically Signed   By: Burman Nieves M.D.   On: 07/02/2023 17:16   ECHOCARDIOGRAM LIMITED Result Date: 07/02/2023    ECHOCARDIOGRAM LIMITED REPORT   Patient Name:   Eulanda E Avila Date of Exam: 07/02/2023 Medical Rec #:  [id number]     Height:  62.0 in Accession #:    [id number]    Weight:       181.8 lb Date of Birth:  1950-06-10     BSA:          1.836 m Patient Age:    72 years      BP:           116/73 mmHg Patient Gender: F             HR:           71 bpm. Exam Location:  Inpatient Procedure: Limited Echo, Cardiac Doppler and Color Doppler (Both Spectral and            Color Flow Doppler were utilized during procedure). Indications:    Chemo, Abnormal ECG  History:        Patient has no prior history of Echocardiogram examinations.                 Risk Factors:Hypertension, Diabetes, Dyslipidemia and Former                  Smoker.  Sonographer:    Karma Ganja Referring Phys: [id number] Warrick Parisian J Azariah Bonura IMPRESSIONS  1. Left ventricular ejection fraction, by estimation, is 65 to 70%. The left ventricle has normal function.  2. Right ventricular systolic function is normal. The right ventricular size is normal.  3. No evidence of mitral valve regurgitation.  4. The aortic valve is tricuspid. Aortic valve regurgitation is not visualized. No aortic stenosis is present.  5. The inferior vena cava is normal in size with greater than 50% respiratory variability, suggesting right atrial pressure of 3 mmHg. Comparison(s): No significant change from prior study. Prior images reviewed side by side. FINDINGS  Left Ventricle: Left ventricular ejection fraction, by estimation, is 65 to 70%. The left ventricle has normal function. Strain imaging was not performed. The left ventricular internal cavity size was normal in size. There is no left ventricular hypertrophy. Right Ventricle: The right ventricular size is normal. Right ventricular systolic function is normal. Pericardium: Presence of epicardial fat layer. Aortic Valve: The aortic valve is tricuspid. Aortic valve regurgitation is not visualized. No aortic stenosis is present. Pulmonic Valve: The pulmonic valve was grossly normal. Pulmonic valve regurgitation is not visualized. Venous: The inferior vena cava is normal in size with greater than 50% respiratory variability, suggesting right atrial pressure of 3 mmHg. IAS/Shunts: The atrial septum is grossly normal. Additional Comments: 3D imaging was not performed. Spectral Doppler performed. Color Doppler performed.  LEFT VENTRICLE PLAX 2D LVIDd:         4.70 cm LVIDs:         3.00 cm LV PW:         0.80 cm LV IVS:        0.75 cm LVOT diam:     2.00 cm LVOT Area:     3.14 cm  IVC IVC diam: 1.60 cm LEFT ATRIUM         Index LA diam:    2.70 cm 1.47 cm/m   SHUNTS Systemic Diam: 2.00 cm Riley Lam MD Electronically signed by Riley Lam MD Signature Date/Time: 07/02/2023/4:01:03 PM    Final    US THORACENTESIS ASP PLEURAL SPACE W/IMG GUIDE Result Date: 07/02/2023 INDICATION: 73 year old female with uterine cancer and right pleural effusion. EXAM: ULTRASOUND GUIDED RIGHT THORACENTESIS MEDICATIONS: 12 mL 1% lidocaine COMPLICATIONS: None immediate. PROCEDURE: An ultrasound guided thoracentesis was thoroughly discussed with the patient  and questions answered. The benefits, risks, alternatives and complications were also discussed. The patient understands and wishes to proceed with the procedure. Written consent was obtained. Ultrasound was performed to localize and mark an adequate pocket of fluid in the right chest. The area was then prepped and draped in the normal sterile fashion. 1% Lidocaine was used for local anesthesia. Under ultrasound guidance a 8 Fr Safe-T-Centesis catheter was introduced. Thoracentesis was performed. The catheter was removed and a dressing applied. FINDINGS: A total of approximately 770 mL of dark red, thin fluid was removed. Samples were sent to the laboratory as requested by the clinical team. IMPRESSION: Successful ultrasound guided right thoracentesis yielding 770 mL of pleural fluid. Performed By Theresa Mulligan, PA-C Electronically Signed   By: Gilmer Mor D.O.   On: 07/02/2023 15:23   DG Chest 1 View Result Date: 06/30/2023 CLINICAL DATA:  [id number] S/P thoracentesis [id number] EXAM: CHEST  1 VIEW COMPARISON:  06/29/2023. FINDINGS: There is moderate right pleural effusion, decreased since the prior study, status post thoracentesis. No pneumothorax. There are probable associated compressive atelectatic changes in the right lung. Bilateral lung fields are otherwise clear. Left lateral costophrenic angle is clear. Evaluation of cardiomediastinal silhouette is nondiagnostic due to right lower hemithorax opacification. No acute osseous abnormalities. The soft tissues are within normal limits. Right-sided CT  Port-A-Cath is seen with its tip overlying the cavoatrial junction region, unchanged. IMPRESSION: Moderate right pleural effusion, decreased since the prior study, status post thoracentesis. No pneumothorax. Electronically Signed   By: Jules Schick M.D.   On: 06/30/2023 11:54   US THORACENTESIS ASP PLEURAL SPACE W/IMG GUIDE Result Date: 06/30/2023 INDICATION: 73 year old female with uterine cancer and right pleural effusion. EXAM: ULTRASOUND GUIDED RIGHT THORACENTESIS MEDICATIONS: 10 mL 1% lidocaine COMPLICATIONS: None immediate. PROCEDURE: An ultrasound guided thoracentesis was thoroughly discussed with the patient and questions answered. The benefits, risks, alternatives and complications were also discussed. The patient understands and wishes to proceed with the procedure. Written consent was obtained. Ultrasound was performed to localize and mark an adequate pocket of fluid in the right chest. The area was then prepped and draped in the normal sterile fashion. 1% Lidocaine was used for local anesthesia. Under ultrasound guidance a 6 Fr Safe-T-Centesis catheter was introduced. Thoracentesis was performed. The catheter was removed and a dressing applied. FINDINGS: A total of approximately 1.1 L of clear amber fluid was removed. IMPRESSION: Successful ultrasound guided right thoracentesis yielding 1.1 L of pleural fluid. Performed By Theresa Mulligan, PA-C Electronically Signed   By: Marliss Coots M.D.   On: 06/30/2023 11:37   CT Angio Chest PE W/Cm &/Or Wo Cm Result Date: 06/29/2023 CLINICAL DATA:  History of endometrial cancer, weakness, fatigue, dyspnea on exertion EXAM: CT ANGIOGRAPHY CHEST WITH CONTRAST TECHNIQUE: Multidetector CT imaging of the chest was performed using the standard protocol during bolus administration of intravenous contrast. Multiplanar CT image reconstructions and MIPs were obtained to evaluate the vascular anatomy. RADIATION DOSE REDUCTION: This exam was performed according to the  departmental dose-optimization program which includes automated exposure control, adjustment of the mA and/or kV according to patient size and/or use of iterative reconstruction technique. CONTRAST:  75mL OMNIPAQUE IOHEXOL 350 MG/ML SOLN COMPARISON:  04/03/2023, 06/29/2023 FINDINGS: Cardiovascular: This is a technically adequate evaluation of the pulmonary vasculature. No filling defects or pulmonary emboli. The heart is unremarkable without pericardial effusion. No evidence of thoracic aortic aneurysm or dissection. Atherosclerosis of the aorta and coronary vasculature. Stable right chest wall port via  internal jugular approach, tip within the superior vena cava at the atriocaval junction. Mediastinum/Nodes: No enlarged mediastinal, hilar, or axillary lymph nodes. Thyroid gland, trachea, and esophagus demonstrate no significant findings. Lungs/Pleura: There is a large right pleural effusion, volume estimated in excess of 2 L. There is dense consolidation and volume loss within the right middle and right lower lobes consistent with atelectasis. Minimal dependent atelectasis within the right upper lobe. Left lung is clear. No pneumothorax. Central airways are patent. Upper Abdomen: No acute abnormality. Musculoskeletal: No acute or destructive bony abnormalities. Reconstructed images demonstrate no additional findings. Review of the MIP images confirms the above findings. IMPRESSION: 1. No evidence of pulmonary embolus. 2. Large right pleural effusion volume estimated in excess of 2 L. 3. Significant right lung atelectasis, most pronounced within the right middle and right lower lobes. 4. Aortic Atherosclerosis (ICD10-I70.0). Coronary artery atherosclerosis. Electronically Signed   By: Sharlet Salina M.D.   On: 06/29/2023 19:24   DG Chest Port 1 View Result Date: 06/29/2023 CLINICAL DATA:  Shortness of breath. EXAM: PORTABLE CHEST 1 VIEW COMPARISON:  Chest radiograph dated 04/02/2018 and CT dated 04/03/2023.  FINDINGS: [place] Port-A-Cath with tip in the region of the cavoatrial junction. Moderate right pleural effusion with right lung base compressive atelectasis versus pneumonia. The left lung is clear. No pneumothorax. Stable cardiac silhouette. No acute osseous pathology. IMPRESSION: Moderate right pleural effusion with right lung base compressive atelectasis versus pneumonia. Electronically Signed   By: Elgie Collard M.D.   On: 06/29/2023 10:47    Microbiology: Results for orders placed or performed during the hospital encounter of 06/29/23  Resp panel by RT-PCR (RSV, Flu A&B, Covid) Anterior Nasal Swab     Status: None   Collection Time: 06/29/23  9:59 AM   Specimen: Anterior Nasal Swab  Result Value Ref Range Status   SARS Coronavirus 2 by RT PCR NEGATIVE NEGATIVE Final    Comment: (NOTE) SARS-CoV-2 target nucleic acids are NOT DETECTED.  The SARS-CoV-2 RNA is generally detectable in upper respiratory specimens during the acute phase of infection. The lowest concentration of SARS-CoV-2 viral copies this assay can detect is 138 copies/mL. A negative result does not preclude SARS-Cov-2 infection and should not be used as the sole basis for treatment or other patient management decisions. A negative result may occur with  improper specimen collection/handling, submission of specimen other than nasopharyngeal swab, presence of viral mutation(s) within the areas targeted by this assay, and inadequate number of viral copies(<138 copies/mL). A negative result must be combined with clinical observations, patient history, and epidemiological information. The expected result is Negative.  Fact Sheet for Patients:  BloggerCourse.com  Fact Sheet for Healthcare Providers:  SeriousBroker.it  This test is no t yet approved or cleared by the [place] FDA and  has been authorized for detection and/or diagnosis of SARS-CoV-2 by FDA under an  Emergency Use Authorization (EUA). This EUA will remain  in effect (meaning this test can be used) for the duration of the COVID-19 declaration under Section 564(b)(1) of the Act, 21 U.S.C.section 360bbb-3(b)(1), unless the authorization is terminated  or revoked sooner.       Influenza A by PCR NEGATIVE NEGATIVE Final   Influenza B by PCR NEGATIVE NEGATIVE Final    Comment: (NOTE) The Xpert Xpress SARS-CoV-2/FLU/RSV plus assay is intended as an aid in the diagnosis of influenza from Nasopharyngeal swab specimens and should not be used as a sole basis for treatment. Nasal washings and aspirates are unacceptable for  Xpert Xpress SARS-CoV-2/FLU/RSV testing.  Fact Sheet for Patients: BloggerCourse.com  Fact Sheet for Healthcare Providers: SeriousBroker.it  This test is not yet approved or cleared by the [place] FDA and has been authorized for detection and/or diagnosis of SARS-CoV-2 by FDA under an Emergency Use Authorization (EUA). This EUA will remain in effect (meaning this test can be used) for the duration of the COVID-19 declaration under Section 564(b)(1) of the Act, 21 U.S.C. section 360bbb-3(b)(1), unless the authorization is terminated or revoked.     Resp Syncytial Virus by PCR NEGATIVE NEGATIVE Final    Comment: (NOTE) Fact Sheet for Patients: BloggerCourse.com  Fact Sheet for Healthcare Providers: SeriousBroker.it  This test is not yet approved or cleared by the [place] FDA and has been authorized for detection and/or diagnosis of SARS-CoV-2 by FDA under an Emergency Use Authorization (EUA). This EUA will remain in effect (meaning this test can be used) for the duration of the COVID-19 declaration under Section 564(b)(1) of the Act, 21 U.S.C. section 360bbb-3(b)(1), unless the authorization is terminated or revoked.  Performed at [facility], [address], [place], [place] [address]   Body fluid culture w Gram Stain     Status: None (Preliminary result)   Collection Time: 07/02/23  2:15 PM   Specimen: PATH Cytology Pleural fluid  Result Value Ref Range Status   Specimen Description PLEURAL  Final   Special Requests NONE  Final   Gram Stain   Final    FEW WBC PRESENT, PREDOMINANTLY MONONUCLEAR NO ORGANISMS SEEN    Culture   Final    NO GROWTH < 12 HOURS Performed at [facility], [address] [place], [place] [address]    Report Status PENDING  Incomplete    Labs: CBC: Recent Labs  Lab 06/29/23 0955 06/30/23 0417 07/01/23 1204 07/02/23 0529 07/03/23 0410  WBC 4.6 4.2 4.7 4.5 4.0  NEUTROABS  --   --  2.7 2.0 1.6*  HGB 10.8* 10.2* 10.6* 10.3* 9.9*  HCT 32.8* 30.6* 32.1* 30.6* 30.0*  MCV 104.1* 104.4* 104.9* 104.1* 106.0*  PLT 168 153 163 159 166   Basic Metabolic Panel: Recent Labs  Lab 06/29/23 0955 06/29/23 2015 06/30/23 0417 07/01/23 1204 07/02/23 0529 07/03/23 0410  NA 138  --  138 139 137 138  K 3.8  --  3.3* 3.4* 3.5 3.7  CL 103  --  104 102 104 104  CO2 26  --  25 26 25 27   GLUCOSE 98  --  96 120* 104* 92  BUN 12  --  9 14 11 13   CREATININE 0.50  --  0.63 0.79 0.61 0.60  CALCIUM 9.6  --  9.0 9.5 9.3 9.0  MG  --  1.5* 2.1  --   --   --   PHOS  --  3.1  --   --   --   --    Liver Function Tests: Recent Labs  Lab 06/29/23 0955 07/01/23 1206  AST 18  --   ALT 11  --   ALKPHOS 49  --   BILITOT 0.6  --   PROT 7.6 7.0  ALBUMIN 4.0  --    CBG: Recent Labs  Lab 07/01/23 2126 07/02/23 0732 07/02/23 1148  GLUCAP 118* 97 109*    Discharge time spent: less than 30 minutes.  Signed: Briant Cedar, MD [facility] 07/03/2023

## 2023-07-03 NOTE — Progress Notes (Signed)
 TOC meds delivered to pt in room by this RN

## 2023-07-03 NOTE — Progress Notes (Addendum)
 PHARMACY - ANTICOAGULATION CONSULT NOTE  Pharmacy Consult for warfarin Indication: atrial fibrillation  Allergies  Allergen Reactions   Crestor [Rosuvastatin Calcium] Swelling and Other (See Comments)    Swelling of legs and muscle weakness   Simvastatin Swelling and Other (See Comments)    Swelling of legs and muscle weakness    Patient Measurements: Height: 5\' 2"  (157.5 cm) Weight: 82.5 kg (181 lb 12.8 oz) IBW/kg (Calculated) : 50.1  Vital Signs: Temp: 98.5 F (36.9 C) (02/28 0700) Temp Source: Oral (02/28 0700) BP: 118/73 (02/28 0700) Pulse Rate: 81 (02/28 0700)  Labs: Recent Labs    07/01/23 1204 07/01/23 1231 07/02/23 0529 07/03/23 0410  HGB 10.6*  --  10.3* 9.9*  HCT 32.1*  --  30.6* 30.0*  PLT 163  --  159 166  LABPROT  --  13.7 14.0 14.9  INR  --  1.0 1.1 1.1  CREATININE 0.79  --  0.61 0.60    Estimated Creatinine Clearance: 62.4 mL/min (by C-G formula based on SCr of 0.6 mg/dL).   Medical History: Past Medical History:  Diagnosis Date   Colitis    Endometrial cancer (HCC)    History of radiation therapy    Vagina-04/20/23-05/11/23- Dr. Antony Blackbird   Hyperlipidemia    Hypertension    Malnutrition (HCC)    Stroke [place]) 2019   balance still affected    Medications:  No prior to admission anticoagulant meds listed although patient reported being on apixaban in the past Currently on aspirin  81 mg daily and enoxaparin 40 mg subcutaneous daily for VTE prophylaxis, last dose enoxaparin 2/25 2110  Assessment: Pharmacy consulted to dose warfarin for this 73 yo female with new onset atrial fibrillation and unable to affort a direct oral anticoagulant (DOAC).    CHA2DS2-VASc risk score: 5 (female, age, HTN, History of stroke). [place] Coumadin Dosing Nomogram & Total Points: 5  Today, 07/03/23: INR remains subtherapeutic at 1.1 after two one-time doses of warfarin 7.5 mg on 2/26 and 2/27 Hgb 10.3 >> 9.9 from day prior--low, stable; plts  166--stable Per MD, repeat thoracentesis done 2/27 yielded of dark red fluid No s/sx of bleeding, per RN No DDIs noted  Goal of Therapy:  Reduce risk of stroke and systemic embolism in patients with afib Monitor platelets by anticoagulation protocol: Yes INR: 2-3   Plan:  Warfarin 10mg  po x 1 today Per Cardiology,  Bridge with enoxaparin at approximately 1mg /kg subcutaneous q12hr until INR within goal INR 2-3 Aspirin discontinued Monitor daily PT/INR for warfarin dose & adjustment, CBC, signs/symptoms of bleeding   Thank you for allowing pharmacy to be a part of this patient's care.  Cherylin Mylar, PharmD Clinical Pharmacist  2/28/20258:21 AM

## 2023-07-06 LAB — BODY FLUID CULTURE W GRAM STAIN: Culture: NO GROWTH

## 2023-07-06 LAB — ACID FAST SMEAR (AFB, MYCOBACTERIA): Acid Fast Smear: NEGATIVE

## 2023-07-07 ENCOUNTER — Other Ambulatory Visit: Payer: Self-pay

## 2023-07-07 ENCOUNTER — Ambulatory Visit: Payer: Medicare Other | Attending: Cardiovascular Disease | Admitting: *Deleted

## 2023-07-07 DIAGNOSIS — I4891 Unspecified atrial fibrillation: Secondary | ICD-10-CM | POA: Diagnosis not present

## 2023-07-07 DIAGNOSIS — Z5181 Encounter for therapeutic drug level monitoring: Secondary | ICD-10-CM | POA: Insufficient documentation

## 2023-07-07 DIAGNOSIS — I480 Paroxysmal atrial fibrillation: Secondary | ICD-10-CM

## 2023-07-07 DIAGNOSIS — I82453 Acute embolism and thrombosis of peroneal vein, bilateral: Secondary | ICD-10-CM

## 2023-07-07 LAB — POCT INR: INR: 2.3 (ref 2.0–3.0)

## 2023-07-07 NOTE — Patient Instructions (Addendum)
 A full discussion of the nature of anticoagulants has been carried out.  A benefit risk analysis has been presented to the patient, so that they understand the justification for choosing anticoagulation at this time. The need for frequent and regular monitoring, precise dosage adjustment and compliance is stressed.  Side effects of potential bleeding are discussed.  The patient should avoid any OTC items containing aspirin or ibuprofen, and should avoid great swings in general diet.  Avoid alcohol consumption.  Call if any signs of abnormal bleeding.   Description   Stop lovenox injections. Start taking 1 tablet daily except 1/2 tablet on Monday, Wednesday, and Friday. Start eating a green leafy veggie 2-3 times a week. Anticoagulation Clinic [phone]

## 2023-07-10 ENCOUNTER — Other Ambulatory Visit (HOSPITAL_COMMUNITY): Payer: Self-pay

## 2023-07-10 ENCOUNTER — Inpatient Hospital Stay: Payer: Medicare Other

## 2023-07-10 ENCOUNTER — Encounter: Payer: Self-pay | Admitting: Oncology

## 2023-07-10 ENCOUNTER — Ambulatory Visit (HOSPITAL_COMMUNITY)
Admission: RE | Admit: 2023-07-10 | Discharge: 2023-07-10 | Disposition: A | Discharge status: Home / Self Care | Source: Ambulatory Visit | Attending: Hematology and Oncology | Admitting: Hematology and Oncology

## 2023-07-10 ENCOUNTER — Encounter: Payer: Self-pay | Admitting: Hematology and Oncology

## 2023-07-10 ENCOUNTER — Inpatient Hospital Stay: Payer: Medicare Other | Attending: Gynecologic Oncology

## 2023-07-10 ENCOUNTER — Inpatient Hospital Stay (HOSPITAL_BASED_OUTPATIENT_CLINIC_OR_DEPARTMENT_OTHER): Payer: Medicare Other | Admitting: Hematology and Oncology

## 2023-07-10 ENCOUNTER — Inpatient Hospital Stay: Admitting: Licensed Clinical Social Worker

## 2023-07-10 ENCOUNTER — Ambulatory Visit (HOSPITAL_COMMUNITY)
Admission: RE | Admit: 2023-07-10 | Discharge: 2023-07-10 | Disposition: A | Discharge status: Home / Self Care | Source: Ambulatory Visit | Attending: Radiology | Admitting: Radiology

## 2023-07-10 ENCOUNTER — Telehealth: Payer: Self-pay | Admitting: Oncology

## 2023-07-10 VITALS — BP 120/76

## 2023-07-10 VITALS — BP 124/61 | HR 58 | Resp 18 | Ht 62.0 in | Wt 178.2 lb

## 2023-07-10 DIAGNOSIS — C786 Secondary malignant neoplasm of retroperitoneum and peritoneum: Secondary | ICD-10-CM | POA: Insufficient documentation

## 2023-07-10 DIAGNOSIS — C55 Malignant neoplasm of uterus, part unspecified: Secondary | ICD-10-CM | POA: Insufficient documentation

## 2023-07-10 DIAGNOSIS — Z79620 Long term (current) use of immunosuppressive biologic: Secondary | ICD-10-CM | POA: Diagnosis not present

## 2023-07-10 DIAGNOSIS — C799 Secondary malignant neoplasm of unspecified site: Secondary | ICD-10-CM | POA: Insufficient documentation

## 2023-07-10 DIAGNOSIS — J90 Pleural effusion, not elsewhere classified: Secondary | ICD-10-CM

## 2023-07-10 DIAGNOSIS — I4891 Unspecified atrial fibrillation: Secondary | ICD-10-CM | POA: Diagnosis not present

## 2023-07-10 DIAGNOSIS — D61818 Other pancytopenia: Secondary | ICD-10-CM

## 2023-07-10 DIAGNOSIS — Z452 Encounter for adjustment and management of vascular access device: Secondary | ICD-10-CM | POA: Diagnosis not present

## 2023-07-10 DIAGNOSIS — J910 Malignant pleural effusion: Secondary | ICD-10-CM | POA: Insufficient documentation

## 2023-07-10 DIAGNOSIS — Z5111 Encounter for antineoplastic chemotherapy: Secondary | ICD-10-CM | POA: Diagnosis not present

## 2023-07-10 DIAGNOSIS — Z5112 Encounter for antineoplastic immunotherapy: Secondary | ICD-10-CM | POA: Insufficient documentation

## 2023-07-10 DIAGNOSIS — Z48813 Encounter for surgical aftercare following surgery on the respiratory system: Secondary | ICD-10-CM | POA: Diagnosis not present

## 2023-07-10 DIAGNOSIS — Z7189 Other specified counseling: Secondary | ICD-10-CM | POA: Insufficient documentation

## 2023-07-10 LAB — CBC WITH DIFFERENTIAL/PLATELET
Abs Immature Granulocytes: 0.01 10*3/uL (ref 0.00–0.07)
Basophils Absolute: 0 10*3/uL (ref 0.0–0.1)
Basophils Relative: 1 %
Eosinophils Absolute: 0.1 10*3/uL (ref 0.0–0.5)
Eosinophils Relative: 2 %
HCT: 31.8 % — ABNORMAL LOW (ref 36.0–46.0)
Hemoglobin: 10.7 g/dL — ABNORMAL LOW (ref 12.0–15.0)
Immature Granulocytes: 0 %
Lymphocytes Relative: 44 %
Lymphs Abs: 1.8 10*3/uL (ref 0.7–4.0)
MCH: 33.5 pg (ref 26.0–34.0)
MCHC: 33.6 g/dL (ref 30.0–36.0)
MCV: 99.7 fL (ref 80.0–100.0)
Monocytes Absolute: 0.3 10*3/uL (ref 0.1–1.0)
Monocytes Relative: 7 %
Neutro Abs: 1.9 10*3/uL (ref 1.7–7.7)
Neutrophils Relative %: 46 %
Platelets: 210 10*3/uL (ref 150–400)
RBC: 3.19 MIL/uL — ABNORMAL LOW (ref 3.87–5.11)
RDW: 12.3 % (ref 11.5–15.5)
WBC: 4 10*3/uL (ref 4.0–10.5)
nRBC: 0 % (ref 0.0–0.2)

## 2023-07-10 LAB — COMPREHENSIVE METABOLIC PANEL
ALT: 15 U/L (ref 0–44)
AST: 17 U/L (ref 15–41)
Albumin: 4.2 g/dL (ref 3.5–5.0)
Alkaline Phosphatase: 53 U/L (ref 38–126)
Anion gap: 6 (ref 5–15)
BUN: 14 mg/dL (ref 8–23)
CO2: 28 mmol/L (ref 22–32)
Calcium: 9.5 mg/dL (ref 8.9–10.3)
Chloride: 105 mmol/L (ref 98–111)
Creatinine, Ser: 0.71 mg/dL (ref 0.44–1.00)
GFR, Estimated: >60 mL/min (ref >60)
Glucose, Bld: 90 mg/dL (ref 70–99)
Potassium: 3.8 mmol/L (ref 3.5–5.1)
Sodium: 139 mmol/L (ref 135–145)
Total Bilirubin: 0.3 mg/dL (ref 0.0–1.2)
Total Protein: 7.1 g/dL (ref 6.5–8.1)

## 2023-07-10 MED ORDER — HEPARIN SOD (PORK) LOCK FLUSH 100 UNIT/ML IV SOLN
500.0000 [IU] | Freq: Once | INTRAVENOUS | Status: AC
  Administered 2023-07-10: 500 [IU]

## 2023-07-10 MED ORDER — DEXAMETHASONE 4 MG PO TABS
ORAL_TABLET | ORAL | 6 refills | Status: DC
  Filled 2023-07-10: qty 16, 84d supply, fill #0

## 2023-07-10 MED ORDER — LIDOCAINE HCL 1 % IJ SOLN
INTRAMUSCULAR | Status: AC
  Filled 2023-07-10: qty 20

## 2023-07-10 MED ORDER — APIXABAN 5 MG PO TABS
5.0000 mg | ORAL_TABLET | Freq: Two times a day (BID) | ORAL | 11 refills | Status: AC
  Filled 2023-07-10: qty 60, 30d supply, fill #0
  Filled 2023-08-11: qty 60, 30d supply, fill #1
  Filled 2023-09-09: qty 60, 30d supply, fill #2
  Filled 2023-10-13: qty 60, 30d supply, fill #3
  Filled 2023-11-23: qty 60, 30d supply, fill #4
  Filled 2023-12-24: qty 60, 30d supply, fill #5

## 2023-07-10 MED ORDER — SODIUM CHLORIDE 0.9% FLUSH
10.0000 mL | Freq: Once | INTRAVENOUS | Status: AC
  Administered 2023-07-10: 10 mL

## 2023-07-10 NOTE — Progress Notes (Signed)
 DISCONTINUE ON PATHWAY REGIMEN - Uterine     Cycle 1: A cycle is 21 days:     Trastuzumab-xxxx      Paclitaxel      Carboplatin    Cycles 2 through 6: A cycle is every 21 days:     Trastuzumab-xxxx      Paclitaxel      Carboplatin    Cycles 7 and beyond: A cycle is every 21 days:     Trastuzumab-xxxx   **Always confirm dose/schedule in your pharmacy ordering system**  PRIOR TREATMENT: [id number]: Carboplatin AUC=5 + Paclitaxel 175 mg/m2 + Trastuzumab 8/6 mg/kg IV q21 Days x 6 Cycles, Followed By Trastuzumab Maintenance (6 mg/kg IV q21 Days) Until Progression or Unacceptable Toxicity, and Referral to Radiation  START OFF PATHWAY REGIMEN - Uterine   [id number]:Carboplatin AUC=5 IV D1 + Dostarlimab 500 mg IV D1 + Paclitaxel 175 mg/m2 IV D1 x 6 Cycles Followed by Dostarlimab 1,000 mg IV D1 q42 Days:   Cycles 1 through 6: A cycle is every 21 days:     Dostarlimab-gxly      Paclitaxel      Carboplatin    Cycles 7 and beyond: A cycle is every 42 days:     Dostarlimab-gxly   **Always confirm dose/schedule in your pharmacy ordering system**  Patient Characteristics: Serous Carcinoma, Recurrent/Progressive Disease, Second Line, Relapse < 12 Months From Prior Therapy Histology: Serous Carcinoma Therapeutic Status: Recurrent or Progressive Disease Line of Therapy: Second Line Time to Recurrence: Relapse < 12 Months From Prior Therapy Intent of Therapy: Non-Curative / Palliative Intent, Discussed with Patient

## 2023-07-10 NOTE — Procedures (Signed)
 Ultrasound-guided  therapeutic right thoracentesis performed yielding 900 cc  of blood-tinged  fluid. No immediate complications. Follow-up chest x-ray pending. EBL< 2 cc.

## 2023-07-10 NOTE — Assessment & Plan Note (Addendum)
 She is symptomatic I will order urgent thoracentesis for therapeutic purposes I plan to repeat chest x-ray again next week and to follow in case she need repeat thoracentesis again

## 2023-07-10 NOTE — Telephone Encounter (Signed)
 Nelida Gores and let her know there is a patient assistance program for Eliquis through [employer].  Kynsie said she would be interested in looking at it.  Printed out the form for her to review and she will pick it up this afternoon.

## 2023-07-10 NOTE — Progress Notes (Signed)
 [facility] OFFICE PROGRESS NOTE  Patient Care Team: Merri Brunette, MD as PCP - General (Internal Medicine) Wyline Mood Alben Spittle, MD as PCP - Cardiology (Cardiology) Paulina Fusi Servando Snare, RN as Oncology Nurse Navigator (Oncology)  Assessment & Plan Metastasis from cancer of uterus [facility]) The patient initially presented in April 2024 with postmenopausal bleeding.  CT imaging revealed stage IV disease with metastatic cancer to the pleural fluid as well as peritoneal metastasis Biopsy review high-grade serous carcinoma MMR normal, Her2 positive, FISh 3+, MSI stable High grade serous PIK3CA pathogenic variant, p53 mutated She received neoadjuvant chemotherapy with combination of carboplatin, paclitaxel and trastuzumab with excellent response to therapy, followed by interval debulking surgery and then adjuvant chemotherapy which she completed by November 2024, followed by maintenance trastuzumab She was admitted to the hospital in February 2025 with recurrent pleural effusion Diagnostic and therapeutic thoracentesis was performed and cytology from fluid confirmed recurrence of disease  I reviewed CT imaging of her abdomen and pelvis from June 29, 2023 which show evidence of recurrent disease in her pelvis as well as recurrent right pleural effusion  I will get pathologist to see if we can add HER2/neu testing to confirm recurrent HER2 positive disease I have discontinued for treatment today in light of recurrent disease We discussed current NCCN guidelines Previously, she have excellent response to chemotherapy I recommend treatment with combination of carboplatin, paclitaxel and dostarlimab  We reviewed the NCCN guidelines I recommend treatment based on publication as follows:  Dostarlimab for Primary Advanced or Recurrent Endometrial Cancer  [place]oor Maryclare Bean, M.D., Cherly Beach. Almeta Monas, M.D., Orion Crook. Slomovitz, M.D., Ren Doreene Adas, Ph.D., Sherri Rad, Ph.D., Doristine Locks, M.D.,  Lattie Haw, M.D., Aleda Grana, M.D., Sharlene Motts, M.D., Rosezella Florida. Buelah Manis, M.D., Ph.D., Clarise Cruz. Tora Kindred, M.D., Genia Plants, M.D., et al., for the [employer] Investigators*  Malva Limes Med [id number] DOI: 10.1056/NEJMoa2216334  BACKGROUND Dostarlimab is an immune-checkpoint inhibitor that targets the programmed cell death 1 receptor. The combination of chemotherapy and immunotherapy may have synergistic effects in the treatment of endometrial cancer.  METHODS We conducted a phase 3, global, double-blind, randomized, placebo-controlled trial. Eligible patients with primary advanced stage III or IV or first recurrent endometrial cancer were randomly assigned in a 1:1 ratio to receive either dostarlimab (500 mg) or placebo, plus carboplatin (area under the concentration-time curve, 5 mg per milliliter per minute) and paclitaxel (175 mg per square meter of body-surface area), every 3 weeks (six cycles), followed by dostarlimab (1000 mg) or placebo every 6 weeks for up to 3 years. The primary end points were progression-free survival as assessed by the investigator according to Response Evaluation Criteria in Solid Tumors (RECIST), version 1.1, and overall survival. Safety was also assessed.  RESULTS Of the 494 patients who underwent randomization, 118 (23.9%) had mismatch repair-deficient (dMMR), microsatellite instability-high (MSI-H) tumors. In the dMMR-MSI-H population, estimated progression-free survival at 24 months was 61.4% (95% confidence interval [CI], 46.3 to 73.4) in the dostarlimab group and 15.7% (95% CI, 7.2 to 27.0) in the placebo group (hazard ratio for progression or death, 0.28; 95% CI, 0.16 to 0.50; P<0.001). In the overall population, progression-free survival at 24 months was 36.1% (95% CI, 29.3 to 42.9) in the dostarlimab group and 18.1% (95% CI, 13.0 to 23.9) in the placebo group (hazard ratio, 0.64; 95% CI, 0.51 to 0.80; P<0.001). Overall survival at 24 months was 71.3%  (95% CI, 64.5 to 77.1) with dostarlimab and 56.0% (95% CI, 48.9 to 62.5) with placebo (  hazard ratio for death, 0.64; 95% CI, 0.46 to 0.87). The most common adverse events that occurred or worsened during treatment were nausea (53.9% of the patients in the dostarlimab group and 45.9% of those in the placebo group), alopecia (53.5% and 50.0%), and fatigue (51.9% and 54.5%). Severe and serious adverse events were more frequent in the dostarlimab group than in the placebo group.  CONCLUSIONS Dostarlimab plus carboplatin-paclitaxel significantly increased progression-free survival among patients with primary advanced or recurrent endometrial cancer, with a substantial benefit in the dMMR-MSI-H population. (Funded by [employer]; [employer].gov number, [id number])  The risks, benefits, side effects of treatment is discussed with the patient and she agreed to proceed with plan of care. I plan upfront dose reduction of paclitaxel due to pre-existing peripheral neuropathy I will arrange for urgent thoracentesis today due to symptomatic shortness of breath I will try to get her started on chemotherapy as soon as possible Recurrent pleural effusion She is symptomatic I will order urgent thoracentesis for therapeutic purposes I plan to repeat chest x-ray again next week and to follow in case she need repeat thoracentesis again Goals of care, counseling/discussion Counseled and coordinated advanced care planning today.  Patient is aware she has stage IV disease and disease is incurable. Treatment offered is palliative in nature. We discussed a realistic and effective plan that will be reviewed and updated on an ongoing basis as indicated. We discussed the following: Risks, benefits and alternatives to the various treatment options Discussed patient's personal belief/values/goals Discussed palliative care options, ways to avoid hospitalization and patient's desire for care if decision making capacity is  affected Discussed MOST form, Advanced Directive and Code Status We discussed CODE STATUS and she does not want to be resuscitated Atrial fibrillation, unspecified type (HCC) She has recent findings of new onset atrial fibrillation and with history of stroke, she is at high risk She was started on warfarin therapy in the hospital Due to unpredictability and high risk of toxicity and narrow therapeutic window of warfarin, I recommend switching her over to apixaban We will get insurance prior authorization and assistance to pay for apixaban  Orders Placed This Encounter  Procedures   US THORACENTESIS ASP PLEURAL SPACE W/IMG GUIDE    Standing Status:   Future    Number of Occurrences:   1    Expected Date:   07/10/2023    Expiration Date:   07/09/2024    Are labs required for specimen collection?:   No    Reason for Exam (SYMPTOM  OR DIAGNOSIS REQUIRED):   recurrent pleural effusion    Preferred imaging location?:   [facility]   DG Chest 2 View    Standing Status:   Future    Expected Date:   07/16/2023    Expiration Date:   07/09/2024    Reason for exam::   recurrent pleural effusion    Preferred imaging location?:   [facility]   CBC with Differential (Cancer Center Only)    Standing Status:   Future    Expected Date:   07/17/2023    Expiration Date:   07/16/2024   CMP (Cancer Center only)    Standing Status:   Future    Expected Date:   07/17/2023    Expiration Date:   07/16/2024   T4    Standing Status:   Future    Expected Date:   07/17/2023    Expiration Date:   07/16/2024   TSH    Standing Status:  Future    Expected Date:   07/17/2023    Expiration Date:   07/16/2024   CBC with Differential (Cancer Center Only)    Standing Status:   Future    Expected Date:   08/07/2023    Expiration Date:   08/06/2024   CMP (Cancer Center only)    Standing Status:   Future    Expected Date:   08/07/2023    Expiration Date:   08/06/2024   CBC with Differential (Cancer Center Only)     Standing Status:   Future    Expected Date:   08/28/2023    Expiration Date:   08/27/2024   CMP (Cancer Center only)    Standing Status:   Future    Expected Date:   08/28/2023    Expiration Date:   08/27/2024   T4    Standing Status:   Future    Expected Date:   08/28/2023    Expiration Date:   08/27/2024   TSH    Standing Status:   Future    Expected Date:   08/28/2023    Expiration Date:   08/27/2024   CBC with Differential (Cancer Center Only)    Standing Status:   Future    Expected Date:   09/18/2023    Expiration Date:   09/17/2024   CMP (Cancer Center only)    Standing Status:   Future    Expected Date:   09/18/2023    Expiration Date:   09/17/2024   CBC with Differential (Cancer Center Only)    Standing Status:   Future    Expected Date:   10/09/2023    Expiration Date:   10/08/2024   CMP (Cancer Center only)    Standing Status:   Future    Expected Date:   10/09/2023    Expiration Date:   10/08/2024   CBC with Differential (Cancer Center Only)    Standing Status:   Future    Expected Date:   10/30/2023    Expiration Date:   10/29/2024   CMP (Cancer Center only)    Standing Status:   Future    Expected Date:   10/30/2023    Expiration Date:   10/29/2024   T4    Standing Status:   Future    Expected Date:   10/30/2023    Expiration Date:   10/29/2024   TSH    Standing Status:   Future    Expected Date:   10/30/2023    Expiration Date:   10/29/2024   CBC with Differential (Cancer Center Only)    Standing Status:   Future    Expected Date:   11/20/2023    Expiration Date:   11/19/2024   CMP (Cancer Center only)    Standing Status:   Future    Expected Date:   11/20/2023    Expiration Date:   11/19/2024   T4    Standing Status:   Future    Expected Date:   11/20/2023    Expiration Date:   11/19/2024   TSH    Standing Status:   Future    Expected Date:   11/20/2023    Expiration Date:   11/19/2024   CBC with Differential (Cancer Center Only)    Standing Status:   Future     Expected Date:   01/01/2024    Expiration Date:   12/31/2024   CMP (Cancer Center only)    Standing Status:   Future  Expected Date:   01/01/2024    Expiration Date:   12/31/2024   T4    Standing Status:   Future    Expected Date:   01/01/2024    Expiration Date:   12/31/2024   TSH    Standing Status:   Future    Expected Date:   01/01/2024    Expiration Date:   12/31/2024   CBC with Differential (Cancer Center Only)    Standing Status:   Future    Expected Date:   02/12/2024    Expiration Date:   02/11/2025   CMP (Cancer Center only)    Standing Status:   Future    Expected Date:   02/12/2024    Expiration Date:   02/11/2025   T4    Standing Status:   Future    Expected Date:   02/12/2024    Expiration Date:   02/11/2025   TSH    Standing Status:   Future    Expected Date:   02/12/2024    Expiration Date:   02/11/2025   CBC with Differential (Cancer Center Only)    Standing Status:   Future    Expected Date:   03/25/2024    Expiration Date:   03/25/2025   CMP (Cancer Center only)    Standing Status:   Future    Expected Date:   03/25/2024    Expiration Date:   03/25/2025   T4    Standing Status:   Future    Expected Date:   03/25/2024    Expiration Date:   03/25/2025   TSH    Standing Status:   Future    Expected Date:   03/25/2024    Expiration Date:   03/25/2025     Artis Delay, MD  INTERVAL HISTORY: she returns for surveillance follow-up We discussed CT imaging findings I reviewed medications with her and discussed the rationale behind switching treatment We discussed risk, benefits, side effects of chemotherapy regimen I recommend switching her anticoagulation therapy to apixaban We discussed goals of care and advance care planning today  PHYSICAL EXAMINATION: ECOG PERFORMANCE STATUS: 1 - Symptomatic but completely ambulatory  Vitals:   07/10/23 0905  BP: 124/61  Pulse: (!) 58  Resp: 18  SpO2: 98%   Filed Weights   07/10/23 0905  Weight: 178 lb 3.2  oz (80.8 kg)   Chest examination review reduced breath sound in the right lung consistent with recurrent pleural effusion  Overall, I spent over 80 minutes with the patient with reviewing test results and arranging for urgent thoracentesis and medication changes

## 2023-07-10 NOTE — Assessment & Plan Note (Addendum)
 The patient initially presented in April 2024 with postmenopausal bleeding.  CT imaging revealed stage IV disease with metastatic cancer to the pleural fluid as well as peritoneal metastasis Biopsy review high-grade serous carcinoma MMR normal, Her2 positive, FISh 3+, MSI stable High grade serous PIK3CA pathogenic variant, p53 mutated She received neoadjuvant chemotherapy with combination of carboplatin, paclitaxel and trastuzumab with excellent response to therapy, followed by interval debulking surgery and then adjuvant chemotherapy which she completed by November 2024, followed by maintenance trastuzumab She was admitted to the hospital in February 2025 with recurrent pleural effusion Diagnostic and therapeutic thoracentesis was performed and cytology from fluid confirmed recurrence of disease  I reviewed CT imaging of her abdomen and pelvis from June 29, 2023 which show evidence of recurrent disease in her pelvis as well as recurrent right pleural effusion  I will get pathologist to see if we can add HER2/neu testing to confirm recurrent HER2 positive disease I have discontinued for treatment today in light of recurrent disease We discussed current NCCN guidelines Previously, she have excellent response to chemotherapy I recommend treatment with combination of carboplatin, paclitaxel and dostarlimab  We reviewed the NCCN guidelines I recommend treatment based on publication as follows:  Dostarlimab for Primary Advanced or Recurrent Endometrial Cancer  [place] Maryclare Bean, M.D., Cherly Beach. Almeta Monas, M.D., Orion Crook. Slomovitz, M.D., Ren Doreene Adas, Ph.D., Sherri Rad, Ph.D., Doristine Locks, M.D., Lattie Haw, M.D., Aleda Grana, M.D., Sharlene Motts, M.D., Rosezella [place]. Buelah Manis, M.D., Ph.D., Clarise Cruz. Tora Kindred, M.D., Genia Plants, M.D., et al., for the [employer] Investigators*  Malva Limes Med [id number] DOI: 10.1056/[id number]  BACKGROUND Dostarlimab is an immune-checkpoint  inhibitor that targets the programmed cell death 1 receptor. The combination of chemotherapy and immunotherapy may have synergistic effects in the treatment of endometrial cancer.  METHODS We conducted a phase 3, global, double-blind, randomized, placebo-controlled trial. Eligible patients with primary advanced stage III or IV or first recurrent endometrial cancer were randomly assigned in a 1:1 ratio to receive either dostarlimab (500 mg) or placebo, plus carboplatin (area under the concentration-time curve, 5 mg per milliliter per minute) and paclitaxel (175 mg per square meter of body-surface area), every 3 weeks (six cycles), followed by dostarlimab (1000 mg) or placebo every 6 weeks for up to 3 years. The primary end points were progression-free survival as assessed by the investigator according to Response Evaluation Criteria in Solid Tumors (RECIST), version 1.1, and overall survival. Safety was also assessed.  RESULTS Of the 494 patients who underwent randomization, 118 (23.9%) had mismatch repair-deficient (dMMR), microsatellite instability-high (MSI-H) tumors. In the dMMR-MSI-H population, estimated progression-free survival at 24 months was 61.4% (95% confidence interval [CI], 46.3 to 73.4) in the dostarlimab group and 15.7% (95% CI, 7.2 to 27.0) in the placebo group (hazard ratio for progression or death, 0.28; 95% CI, 0.16 to 0.50; P<0.001). In the overall population, progression-free survival at 24 months was 36.1% (95% CI, 29.3 to 42.9) in the dostarlimab group and 18.1% (95% CI, 13.0 to 23.9) in the placebo group (hazard ratio, 0.64; 95% CI, 0.51 to 0.80; P<0.001). Overall survival at 24 months was 71.3% (95% CI, 64.5 to 77.1) with dostarlimab and 56.0% (95% CI, 48.9 to 62.5) with placebo (hazard ratio for death, 0.64; 95% CI, 0.46 to 0.87). The most common adverse events that occurred or worsened during treatment were nausea (53.9% of the patients in the dostarlimab group and 45.9% of those in  the placebo group), alopecia (53.5% and 50.0%), and fatigue (51.9% and 54.5%).  Severe and serious adverse events were more frequent in the dostarlimab group than in the placebo group.  CONCLUSIONS Dostarlimab plus carboplatin-paclitaxel significantly increased progression-free survival among patients with primary advanced or recurrent endometrial cancer, with a substantial benefit in the dMMR-MSI-H population. (Funded by [employer]; [employer].gov number, [id number])  The risks, benefits, side effects of treatment is discussed with the patient and she agreed to proceed with plan of care. I plan upfront dose reduction of paclitaxel due to pre-existing peripheral neuropathy I will arrange for urgent thoracentesis today due to symptomatic shortness of breath I will try to get her started on chemotherapy as soon as possible

## 2023-07-10 NOTE — Progress Notes (Signed)
 [facility] Clinical Social Work  Clinical Social Work was referred by Statistician for assessment of psychosocial needs.  Clinical Social Worker contacted patient by phone to offer support and assess for needs.   Patient with concerns regarding Eliquis cost. She is receiving paperwork from [employer] to apply for assistance through [employer]. CSW introduced self and role and discussed other potential avenues for financial assistance, particularly for non-medical needs.  Sent referral to Cancer Services today with pt's permission.  CSW will meet with pt in infusion next week to start Casting for [facility] application.      Maria Igoe E Deverick Pruss, LCSW  Clinical Social Worker [facility]

## 2023-07-10 NOTE — Assessment & Plan Note (Addendum)
 She has recent findings of new onset atrial fibrillation and with history of stroke, she is at high risk She was started on warfarin therapy in the hospital Due to unpredictability and high risk of toxicity and narrow therapeutic window of warfarin, I recommend switching her over to apixaban We will get insurance prior authorization and assistance to pay for apixaban

## 2023-07-10 NOTE — Progress Notes (Signed)
 Requested Her2/Neu testing on accession [id number] with [facility].

## 2023-07-10 NOTE — Assessment & Plan Note (Addendum)
 Counseled and coordinated advanced care planning today.  Patient is aware she has stage IV disease and disease is incurable. Treatment offered is palliative in nature. We discussed a realistic and effective plan that will be reviewed and updated on an ongoing basis as indicated. We discussed the following: Risks, benefits and alternatives to the various treatment options Discussed patient's personal belief/values/goals Discussed palliative care options, ways to avoid hospitalization and patient's desire for care if decision making capacity is affected Discussed MOST form, Advanced Directive and Code Status We discussed CODE STATUS and she does not want to be resuscitated

## 2023-07-11 ENCOUNTER — Telehealth: Payer: Self-pay | Admitting: Hematology and Oncology

## 2023-07-11 NOTE — Telephone Encounter (Signed)
Scheduled appointment per 3/7 los. Patient is aware of the made appointment.

## 2023-07-13 ENCOUNTER — Telehealth: Payer: Self-pay | Admitting: Internal Medicine

## 2023-07-13 NOTE — Telephone Encounter (Signed)
 Patient identification verified by 2 forms. Shade Flood, RN     Reviewed Dr. Bertis Ruddy last OV note on 07/10/23. Per Dr. Bertis Ruddy patient was removed off warfarin for the reason below.   " Due to unpredictability and high risk of toxicity and narrow therapeutic window of warfarin, I recommend switching her over to apixaban We will get insurance prior authorization and assistance to pay for apixaban"   Encounter forwarded to primary cardiologist.

## 2023-07-13 NOTE — Telephone Encounter (Signed)
 Pt c/o medication issue:  1. Name of Medication:   Coumadin  2. How are you currently taking this medication (dosage and times per day)?   Not taking  3. Are you having a reaction (difficulty breathing--STAT)?   4. What is your medication issue?   Husband Jerilynn Som) reported patient will not be taking this medication as Dr. Bertis Ruddy took the patient off this medication.

## 2023-07-14 ENCOUNTER — Ambulatory Visit

## 2023-07-15 LAB — CYTOLOGY - NON PAP

## 2023-07-15 NOTE — Assessment & Plan Note (Signed)
 The patient initially presented in April 2024 with postmenopausal bleeding.  CT imaging revealed stage IV disease with metastatic cancer to the pleural fluid as well as peritoneal metastasis Biopsy review high-grade serous carcinoma, MMR normal, Her2 positive, FISh 3+, MSI stable, High grade serous, PIK3CA pathogenic variant, p53 mutated  She received neoadjuvant chemotherapy with combination of carboplatin, paclitaxel and trastuzumab with excellent response to therapy, followed by interval debulking surgery and then adjuvant chemotherapy, completed by November 2024, followed by maintenance trastuzumab, then had disease recurrence February 2025 with malignant pleural effusion and abdominal lymphadenopathy Repeat fluid cytology confirm HER2 positive disease  Due to rapid disease progression, I recommend treatment with combination of carboplatin, paclitaxel and dostarlimab, to be started this week With recurrent pleural effusion, I recommend urgent thoracentesis for symptomatic relief today I plan upfront dose reduction of paclitaxel due to pre-existing peripheral neuropathy I plan to repeat imaging study after 3 cycles of treatment, around May 2025

## 2023-07-15 NOTE — Progress Notes (Deleted)
 Cardiology Office Note:  .   Date:  07/15/2023  ID:  Maria Avila, DOB 01/26/1951, MRN [id number] PCP: Merri Brunette, MD  [facility] Providers Cardiologist:  Maisie Fus, MD  History of Present Illness: Marland Kitchen   Maria Avila is a 73 y.o. female with a hx of endometrial cancer s/p chemotherapy, surgery and radiation, now on adjuvant immunotherapy, history of CVA, HLD, HTN, recently diagnosed atrial fibrillation. She is followed by Dr. Wyline Mood, presents today for a hospital follow up appointment   Patient was recently admitted from 2/24-2/28/25. Patient had her last chemotherapy session on 2/14. Afterwards, developed fatigue, generalized weakness, shortness of breath, DOE. She was scheduled for an outpatient CT abdomen pelvis on 2/24. She went for the scan and was instructed to go to the ED after she was found to have a new pleural effusion. In the ED, CTA chest showed no evidence of PE, a large right pleural effusion with volume estimated in excess of 2 L. Labwork significant for creatinine 0.50, K 3.8, WBC 4.6, hemoglobin 10.8, platelets 168. BNP 28.7. hsTn 3.    After her CT scan, patient was found to have HR elevated to the 120s-150s. EKG showed atrial fibrillation with HR 144 BPM. She was given IV fluids with improvement in HR and she converted to NSR. She underwent successful US guided right thoracentesis that yielded 1.1 L of clear amber fluid. CXR following the procedure showed a moderate right pleural effusion, no pneumothorax. Cardiology was consulted for atrial fibrillation. She had a CHADS-VASc of 5 so she was started on [facility]. Initially started on coumadin, but her oncologist later transitioned her to eliquis. She was also started on PRN metoprolol for palpitations, tachycardia. Recent echo from 05/2023 showed EF 65-70%, no regional wall motion abnormalities, grade I DD, normal RV function. She was discharged with a zio to assess afib burden/recurrence.   Paroxysmal Atrial Fibrillation   - When seen in the ED on 2/25 patient developed atrial fibrillation with HR in the 120s-150s. Episode lasted about 1 hour. She converted to NSR after receiving IV fluids - Afib occurred in the setting of pleural effusion, hypokalemia. TSH normal  - CHADS-VASc 5 (HTN, age, gender, history of CVA). Continue eliquis 5 mg BID  -  Denies bleeding on eliquis *** - Continue metoprolol ***  Hypokalemia  - When patient had afib on 2/25, her K was 3.3 and mag was  2.1  - Labwork from 3/7 showed K 3.8.   HTN  - - - Continue benazepril-hydrochlorothiazide 20-25 mg daily    Endometrial Cancer  - Followed by oncology   Recent Pleural Effusion  - Patient was admitted 2/25-2/28 and had 2 thoracenteses during that admission. Cytology revealed non-small cell carcinoma - pleural fluid is morphologically involved by the patient's known metastatic papillary serous carcinoma  - When seen by oncology on 3/7, CXR showed small right pleural effusion. She was symptomatic. Underwent thoracentesis that yielded 900 cc pleural fluid  -   ROS: ***  Studies Reviewed: .        *** Risk Assessment/Calculations:   {Does this patient have ATRIAL FIBRILLATION?:[phone]} No BP recorded.  {Refresh Note OR Click here to enter BP  :1}***       Physical Exam:   VS:  There were no vitals taken for this visit.   Wt Readings from Last 3 Encounters:  07/10/23 178 lb 3.2 oz (80.8 kg)  06/29/23 181 lb 12.8 oz (82.5 kg)  06/19/23 181 lb 12.8  oz (82.5 kg)    GEN: Well nourished, well developed in no acute distress NECK: No JVD; No carotid bruits CARDIAC: ***RRR, no murmurs, rubs, gallops RESPIRATORY:  Clear to auscultation without rales, wheezing or rhonchi  ABDOMEN: Soft, non-tender, non-distended EXTREMITIES:  No edema; No deformity   ASSESSMENT AND PLAN: .   ***    {Are you ordering a CV Procedure (e.g. stress test, cath, DCCV, TEE, etc)?   Press F2        :[id number]}  Dispo: ***  Signed, Jonita Albee, PA-C

## 2023-07-16 ENCOUNTER — Encounter: Payer: Self-pay | Admitting: Hematology and Oncology

## 2023-07-16 ENCOUNTER — Inpatient Hospital Stay: Admitting: Hematology and Oncology

## 2023-07-16 ENCOUNTER — Telehealth: Payer: Self-pay

## 2023-07-16 ENCOUNTER — Inpatient Hospital Stay

## 2023-07-16 ENCOUNTER — Encounter: Payer: Self-pay | Admitting: Oncology

## 2023-07-16 ENCOUNTER — Ambulatory Visit (HOSPITAL_COMMUNITY)
Admission: RE | Admit: 2023-07-16 | Discharge: 2023-07-16 | Disposition: A | Discharge status: Home / Self Care | Source: Ambulatory Visit | Attending: Hematology and Oncology | Admitting: Hematology and Oncology

## 2023-07-16 ENCOUNTER — Ambulatory Visit (HOSPITAL_COMMUNITY)
Admission: RE | Admit: 2023-07-16 | Discharge: 2023-07-16 | Disposition: A | Discharge status: Home / Self Care | Source: Ambulatory Visit | Attending: Radiology | Admitting: Radiology

## 2023-07-16 VITALS — BP 149/67 | HR 55 | Temp 98.5°F | Resp 18 | Ht 62.0 in | Wt 180.2 lb

## 2023-07-16 DIAGNOSIS — Z452 Encounter for adjustment and management of vascular access device: Secondary | ICD-10-CM | POA: Diagnosis not present

## 2023-07-16 DIAGNOSIS — J90 Pleural effusion, not elsewhere classified: Secondary | ICD-10-CM | POA: Insufficient documentation

## 2023-07-16 DIAGNOSIS — C786 Secondary malignant neoplasm of retroperitoneum and peritoneum: Secondary | ICD-10-CM | POA: Diagnosis not present

## 2023-07-16 DIAGNOSIS — Z5111 Encounter for antineoplastic chemotherapy: Secondary | ICD-10-CM | POA: Diagnosis not present

## 2023-07-16 DIAGNOSIS — C3490 Malignant neoplasm of unspecified part of unspecified bronchus or lung: Secondary | ICD-10-CM | POA: Diagnosis not present

## 2023-07-16 DIAGNOSIS — Z79620 Long term (current) use of immunosuppressive biologic: Secondary | ICD-10-CM | POA: Diagnosis not present

## 2023-07-16 DIAGNOSIS — C799 Secondary malignant neoplasm of unspecified site: Secondary | ICD-10-CM | POA: Diagnosis not present

## 2023-07-16 DIAGNOSIS — J910 Malignant pleural effusion: Secondary | ICD-10-CM | POA: Diagnosis not present

## 2023-07-16 DIAGNOSIS — Z8542 Personal history of malignant neoplasm of other parts of uterus: Secondary | ICD-10-CM | POA: Insufficient documentation

## 2023-07-16 DIAGNOSIS — C55 Malignant neoplasm of uterus, part unspecified: Secondary | ICD-10-CM

## 2023-07-16 DIAGNOSIS — R918 Other nonspecific abnormal finding of lung field: Secondary | ICD-10-CM | POA: Diagnosis not present

## 2023-07-16 DIAGNOSIS — Z5112 Encounter for antineoplastic immunotherapy: Secondary | ICD-10-CM | POA: Diagnosis not present

## 2023-07-16 DIAGNOSIS — I700 Atherosclerosis of aorta: Secondary | ICD-10-CM | POA: Diagnosis not present

## 2023-07-16 DIAGNOSIS — Z48813 Encounter for surgical aftercare following surgery on the respiratory system: Secondary | ICD-10-CM | POA: Diagnosis not present

## 2023-07-16 DIAGNOSIS — Z4682 Encounter for fitting and adjustment of non-vascular catheter: Secondary | ICD-10-CM | POA: Diagnosis not present

## 2023-07-16 LAB — CMP (CANCER CENTER ONLY)
ALT: 9 U/L (ref 0–44)
AST: 13 U/L — ABNORMAL LOW (ref 15–41)
Albumin: 3.9 g/dL (ref 3.5–5.0)
Alkaline Phosphatase: 47 U/L (ref 38–126)
Anion gap: 5 (ref 5–15)
BUN: 14 mg/dL (ref 8–23)
CO2: 28 mmol/L (ref 22–32)
Calcium: 9.2 mg/dL (ref 8.9–10.3)
Chloride: 107 mmol/L (ref 98–111)
Creatinine: 0.66 mg/dL (ref 0.44–1.00)
GFR, Estimated: >60 mL/min (ref >60)
Glucose, Bld: 89 mg/dL (ref 70–99)
Potassium: 3.6 mmol/L (ref 3.5–5.1)
Sodium: 140 mmol/L (ref 135–145)
Total Bilirubin: 0.3 mg/dL (ref 0.0–1.2)
Total Protein: 6.8 g/dL (ref 6.5–8.1)

## 2023-07-16 LAB — CBC WITH DIFFERENTIAL (CANCER CENTER ONLY)
Abs Immature Granulocytes: 0.02 10*3/uL (ref 0.00–0.07)
Basophils Absolute: 0 10*3/uL (ref 0.0–0.1)
Basophils Relative: 0 %
Eosinophils Absolute: 0.1 10*3/uL (ref 0.0–0.5)
Eosinophils Relative: 1 %
HCT: 30.2 % — ABNORMAL LOW (ref 36.0–46.0)
Hemoglobin: 10.3 g/dL — ABNORMAL LOW (ref 12.0–15.0)
Immature Granulocytes: 0 %
Lymphocytes Relative: 35 %
Lymphs Abs: 1.6 10*3/uL (ref 0.7–4.0)
MCH: 33.9 pg (ref 26.0–34.0)
MCHC: 34.1 g/dL (ref 30.0–36.0)
MCV: 99.3 fL (ref 80.0–100.0)
Monocytes Absolute: 0.2 10*3/uL (ref 0.1–1.0)
Monocytes Relative: 5 %
Neutro Abs: 2.6 10*3/uL (ref 1.7–7.7)
Neutrophils Relative %: 59 %
Platelet Count: 210 10*3/uL (ref 150–400)
RBC: 3.04 MIL/uL — ABNORMAL LOW (ref 3.87–5.11)
RDW: 12.4 % (ref 11.5–15.5)
WBC Count: 4.5 10*3/uL (ref 4.0–10.5)
nRBC: 0 % (ref 0.0–0.2)

## 2023-07-16 LAB — TSH: TSH: 1.466 u[IU]/mL (ref 0.350–4.500)

## 2023-07-16 MED ORDER — HEPARIN SOD (PORK) LOCK FLUSH 100 UNIT/ML IV SOLN
500.0000 [IU] | Freq: Once | INTRAVENOUS | Status: AC
  Administered 2023-07-16: 500 [IU]

## 2023-07-16 MED ORDER — LIDOCAINE HCL 1 % IJ SOLN
INTRAMUSCULAR | Status: AC
Start: 2023-07-16 — End: ?
  Filled 2023-07-16: qty 20

## 2023-07-16 MED ORDER — SODIUM CHLORIDE 0.9% FLUSH
10.0000 mL | Freq: Once | INTRAVENOUS | Status: AC
Start: 2023-07-16 — End: 2023-07-16
  Administered 2023-07-16: 10 mL

## 2023-07-16 MED FILL — Fosaprepitant Dimeglumine For IV Infusion 150 MG (Base Eq): INTRAVENOUS | Qty: 5 | Status: AC

## 2023-07-16 NOTE — Telephone Encounter (Signed)
 Called husband and left a application for patient assistance program for Eliquis through [employer]. He will pick it up today and return to the office when completed.

## 2023-07-16 NOTE — Assessment & Plan Note (Signed)
 She has rapid reaccumulation of pleural fluid and is symptomatic I recommend urgent thoracentesis I will recheck next week after her first dose of treatment

## 2023-07-16 NOTE — Procedures (Signed)
 Ultrasound-guided  therapeutic right thoracentesis performed yielding  1.1 liters of blood-tinged fluid. No immediate complications. Follow-up chest x-ray pending. EBL < 2 cc.

## 2023-07-16 NOTE — Progress Notes (Signed)
 Notified Maria Avila and her husband of thoracentesis appointment today at 1:45 at [facility].  She verbalized understanding and agreement of the appointment.

## 2023-07-16 NOTE — Progress Notes (Signed)
 [facility] OFFICE PROGRESS NOTE  Patient Care Team: Merri Brunette, MD as PCP - General (Internal Medicine) Wyline Mood Alben Spittle, MD as PCP - Cardiology (Cardiology) Paulina Fusi Servando Snare, RN as Oncology Nurse Navigator (Oncology)  ASSESSMENT & PLAN:  Metastasis from cancer of uterus [facility]) The patient initially presented in April 2024 with postmenopausal bleeding.  CT imaging revealed stage IV disease with metastatic cancer to the pleural fluid as well as peritoneal metastasis Biopsy review high-grade serous carcinoma, MMR normal, Her2 positive, FISh 3+, MSI stable, High grade serous, PIK3CA pathogenic variant, p53 mutated  She received neoadjuvant chemotherapy with combination of carboplatin, paclitaxel and trastuzumab with excellent response to therapy, followed by interval debulking surgery and then adjuvant chemotherapy, completed by November 2024, followed by maintenance trastuzumab, then had disease recurrence February 2025 with malignant pleural effusion and abdominal lymphadenopathy Repeat fluid cytology confirm HER2 positive disease  Due to rapid disease progression, I recommend treatment with combination of carboplatin, paclitaxel and dostarlimab, to be started this week With recurrent pleural effusion, I recommend urgent thoracentesis for symptomatic relief today I plan upfront dose reduction of paclitaxel due to pre-existing peripheral neuropathy I plan to repeat imaging study after 3 cycles of treatment, around May 2025  Recurrent pleural effusion She has rapid reaccumulation of pleural fluid and is symptomatic I recommend urgent thoracentesis I will recheck next week after her first dose of treatment  Orders Placed This Encounter  Procedures   US THORACENTESIS ASP PLEURAL SPACE W/IMG GUIDE    Standing Status:   Future    Expected Date:   07/16/2023    Expiration Date:   07/15/2024    Are labs required for specimen collection?:   No    Reason for Exam (SYMPTOM  OR  DIAGNOSIS REQUIRED):   recurrent right pleural effusion    Preferred imaging location?:   [facility]    Call Results- Best Contact Number?:   [phone] / do not hold   DG Chest 2 View    Standing Status:   Future    Expected Date:   07/23/2023    Expiration Date:   07/15/2024    Reason for exam::   recurrent pleural effusion    Preferred imaging location?:   [facility]     Artis Delay, MD 07/16/2023 1:01 PM  INTERVAL HISTORY: Please see below for problem oriented charting. she returns for surveillance follow-up and review test results She started to become shortness of breath over the past 48 hours No recent cough I have personally reviewed her CBC, CMP and chest x-ray which showed worsening right pleural effusion I reviewed her medication list and discussed side effects of treatment with her PHYSICAL EXAMINATION: ECOG PERFORMANCE STATUS: 1 - Symptomatic but completely ambulatory  Vitals:   07/16/23 1050  BP: (!) 149/67  Pulse: (!) 55  Resp: 18  Temp: 98.5 F (36.9 C)  SpO2: 97%   Filed Weights   07/16/23 1050  Weight: 180 lb 3.2 oz (81.7 kg)   Chest examination is performed.  She has reduced breath sound three quarters of her right lung

## 2023-07-17 ENCOUNTER — Other Ambulatory Visit: Payer: Self-pay

## 2023-07-17 ENCOUNTER — Inpatient Hospital Stay: Admitting: Licensed Clinical Social Worker

## 2023-07-17 ENCOUNTER — Inpatient Hospital Stay

## 2023-07-17 VITALS — BP 151/82 | HR 59 | Temp 98.2°F | Resp 18 | Wt 175.5 lb

## 2023-07-17 DIAGNOSIS — C786 Secondary malignant neoplasm of retroperitoneum and peritoneum: Secondary | ICD-10-CM | POA: Diagnosis not present

## 2023-07-17 DIAGNOSIS — J910 Malignant pleural effusion: Secondary | ICD-10-CM | POA: Diagnosis not present

## 2023-07-17 DIAGNOSIS — Z5111 Encounter for antineoplastic chemotherapy: Secondary | ICD-10-CM | POA: Diagnosis not present

## 2023-07-17 DIAGNOSIS — Z5112 Encounter for antineoplastic immunotherapy: Secondary | ICD-10-CM | POA: Diagnosis not present

## 2023-07-17 DIAGNOSIS — C55 Malignant neoplasm of uterus, part unspecified: Secondary | ICD-10-CM

## 2023-07-17 DIAGNOSIS — Z79620 Long term (current) use of immunosuppressive biologic: Secondary | ICD-10-CM | POA: Diagnosis not present

## 2023-07-17 LAB — T4: T4, Total: 9.1 ug/dL (ref 4.5–12.0)

## 2023-07-17 MED ORDER — SODIUM CHLORIDE 0.9% FLUSH
10.0000 mL | INTRAVENOUS | Status: DC | PRN
  Administered 2023-07-17: 10 mL

## 2023-07-17 MED ORDER — SODIUM CHLORIDE 0.9 % IV SOLN
INTRAVENOUS | Status: DC
Start: 2023-07-17 — End: 2023-07-17

## 2023-07-17 MED ORDER — DEXAMETHASONE SODIUM PHOSPHATE 10 MG/ML IJ SOLN
10.0000 mg | Freq: Once | INTRAMUSCULAR | Status: AC
  Administered 2023-07-17: 10 mg via INTRAVENOUS
  Filled 2023-07-17: qty 1

## 2023-07-17 MED ORDER — FOSAPREPITANT DIMEGLUMINE INJECTION 150 MG
150.0000 mg | Freq: Once | INTRAVENOUS | Status: AC
  Administered 2023-07-17: 150 mg via INTRAVENOUS
  Filled 2023-07-17: qty 150

## 2023-07-17 MED ORDER — HEPARIN SOD (PORK) LOCK FLUSH 100 UNIT/ML IV SOLN
500.0000 [IU] | Freq: Once | INTRAVENOUS | Status: AC | PRN
  Administered 2023-07-17: 500 [IU]

## 2023-07-17 MED ORDER — APIXABAN 5 MG PO TABS: 5.0000 mg | ORAL_TABLET | Freq: Two times a day (BID) | ORAL | 3 refills | Status: DC

## 2023-07-17 MED ORDER — PALONOSETRON HCL INJECTION 0.25 MG/5ML
0.2500 mg | Freq: Once | INTRAVENOUS | Status: AC
  Administered 2023-07-17: 0.25 mg via INTRAVENOUS
  Filled 2023-07-17: qty 5

## 2023-07-17 MED ORDER — FAMOTIDINE IN NACL 20-0.9 MG/50ML-% IV SOLN
20.0000 mg | Freq: Once | INTRAVENOUS | Status: AC
Start: 2023-07-17 — End: 2023-07-17
  Administered 2023-07-17: 20 mg via INTRAVENOUS
  Filled 2023-07-17: qty 50

## 2023-07-17 MED ORDER — SODIUM CHLORIDE 0.9 % IV SOLN
131.2500 mg/m2 | Freq: Once | INTRAVENOUS | Status: AC
  Administered 2023-07-17: 246 mg via INTRAVENOUS
  Filled 2023-07-17: qty 41

## 2023-07-17 MED ORDER — SODIUM CHLORIDE 0.9 % IV SOLN
444.5000 mg | Freq: Once | INTRAVENOUS | Status: AC
  Administered 2023-07-17: 440 mg via INTRAVENOUS
  Filled 2023-07-17: qty 44

## 2023-07-17 MED ORDER — SODIUM CHLORIDE 0.9 % IV SOLN
500.0000 mg | Freq: Once | INTRAVENOUS | Status: AC
  Administered 2023-07-17: 500 mg via INTRAVENOUS
  Filled 2023-07-17: qty 10

## 2023-07-17 MED ORDER — CETIRIZINE HCL 10 MG/ML IV SOLN
10.0000 mg | Freq: Once | INTRAVENOUS | Status: AC
  Administered 2023-07-17: 10 mg via INTRAVENOUS
  Filled 2023-07-17: qty 1

## 2023-07-17 NOTE — Patient Instructions (Signed)
 [facility] - A DEPT OF [facility]  Discharge Instructions: Thank you for choosing [facility] to provide your oncology and hematology care.   If you have a lab appointment with the Cancer Center, please go directly to the Cancer Center and check in at the registration area.   Wear comfortable clothing and clothing appropriate for easy access to any Portacath or PICC line.   We strive to give you quality time with your provider. You may need to reschedule your appointment if you arrive late (15 or more minutes).  Arriving late affects you and other patients whose appointments are after yours.  Also, if you miss three or more appointments without notifying the office, you may be dismissed from the clinic at the provider's discretion.      For prescription refill requests, have your pharmacy contact our office and allow 72 hours for refills to be completed.    Today you received the following chemotherapy and/or immunotherapy agents: Jemperli, Taxol, Carboplatin      To help prevent nausea and vomiting after your treatment, we encourage you to take your nausea medication as directed.  BELOW ARE SYMPTOMS THAT SHOULD BE REPORTED IMMEDIATELY: *FEVER GREATER THAN 100.4 F (38 C) OR HIGHER *CHILLS OR SWEATING *NAUSEA AND VOMITING THAT IS NOT CONTROLLED WITH YOUR NAUSEA MEDICATION *UNUSUAL SHORTNESS OF BREATH *UNUSUAL BRUISING OR BLEEDING *URINARY PROBLEMS (pain or burning when urinating, or frequent urination) *BOWEL PROBLEMS (unusual diarrhea, constipation, pain near the anus) TENDERNESS IN MOUTH AND THROAT WITH OR WITHOUT PRESENCE OF ULCERS (sore throat, sores in mouth, or a toothache) UNUSUAL RASH, SWELLING OR PAIN  UNUSUAL VAGINAL DISCHARGE OR ITCHING   Items with * indicate a potential emergency and should be followed up as soon as possible or go to the Emergency Department if any problems should occur.  Please show the CHEMOTHERAPY ALERT  CARD or IMMUNOTHERAPY ALERT CARD at check-in to the Emergency Department and triage nurse.  Should you have questions after your visit or need to cancel or reschedule your appointment, please contact [facility] - A DEPT OF [facility]  Dept: [phone]  and follow the prompts.  Office hours are 8:00 a.m. to 4:30 p.m. Monday - Friday. Please note that voicemails left after 4:00 p.m. may not be returned until the following business day.  We are closed weekends and major holidays. You have access to a nurse at all times for urgent questions. Please call the main number to the clinic Dept: [phone] and follow the prompts.   For any non-urgent questions, you may also contact your provider using MyChart. We now offer e-Visits for anyone 36 and older to request care online for non-urgent symptoms. For details visit [email or web address].   Also download the MyChart app! Go to the app store, search "MyChart", open the app, select [place], and log in with your MyChart username and password.

## 2023-07-17 NOTE — Progress Notes (Signed)
 [facility] CSW Progress Note  Visual merchandiser met with patient and spouse to Financial trader for [facility] application. Application submitted today with supporting documents.  Pt/spouse also had application for assistance from [employer]- CSW brought to [employer] for completion.  CSW reviewed other support services available with pt & spouse. Encouraged them to call with any future support needs.    Gal Smolinski E Sweet Jarvis, LCSW Clinical Social Worker [facility]

## 2023-07-20 NOTE — Progress Notes (Signed)
 Faxed application form on 07/17/23 for Eliquis to [employer] patient assistance foundation at [phone], received fax confirmation.

## 2023-07-21 ENCOUNTER — Ambulatory Visit

## 2023-07-21 ENCOUNTER — Ambulatory Visit: Payer: Medicare Other | Admitting: Cardiology

## 2023-07-22 ENCOUNTER — Telehealth: Payer: Self-pay

## 2023-07-22 ENCOUNTER — Other Ambulatory Visit: Payer: Self-pay | Admitting: Hematology and Oncology

## 2023-07-22 ENCOUNTER — Other Ambulatory Visit: Payer: Self-pay

## 2023-07-22 NOTE — Telephone Encounter (Signed)
 Pt's husband called to request refill on Oxycodone 5mg  IR 1 tab PO Q6H PRN pain qty 30, last filled 06/19/23. Called husband back to let him know this request was forwarded to MD.

## 2023-07-23 ENCOUNTER — Other Ambulatory Visit: Payer: Self-pay | Admitting: Hematology and Oncology

## 2023-07-23 ENCOUNTER — Ambulatory Visit (HOSPITAL_COMMUNITY)
Admission: RE | Admit: 2023-07-23 | Discharge: 2023-07-23 | Disposition: A | Discharge status: Home / Self Care | Source: Ambulatory Visit | Attending: Radiology | Admitting: Radiology

## 2023-07-23 ENCOUNTER — Ambulatory Visit (HOSPITAL_COMMUNITY)
Admission: RE | Admit: 2023-07-23 | Discharge: 2023-07-23 | Disposition: A | Discharge status: Home / Self Care | Source: Ambulatory Visit | Attending: Hematology and Oncology | Admitting: Hematology and Oncology

## 2023-07-23 ENCOUNTER — Ambulatory Visit (HOSPITAL_COMMUNITY)
Admission: RE | Admit: 2023-07-23 | Discharge: 2023-07-23 | Disposition: A | Discharge status: Home / Self Care | Source: Ambulatory Visit | Attending: Hematology and Oncology

## 2023-07-23 ENCOUNTER — Encounter: Payer: Self-pay | Admitting: Hematology and Oncology

## 2023-07-23 ENCOUNTER — Inpatient Hospital Stay: Admitting: Hematology and Oncology

## 2023-07-23 VITALS — BP 119/82 | HR 83 | Temp 98.4°F | Resp 18 | Ht 62.0 in | Wt 173.0 lb

## 2023-07-23 DIAGNOSIS — J90 Pleural effusion, not elsewhere classified: Secondary | ICD-10-CM

## 2023-07-23 DIAGNOSIS — G893 Neoplasm related pain (acute) (chronic): Secondary | ICD-10-CM | POA: Diagnosis not present

## 2023-07-23 DIAGNOSIS — C55 Malignant neoplasm of uterus, part unspecified: Secondary | ICD-10-CM | POA: Insufficient documentation

## 2023-07-23 DIAGNOSIS — Z48813 Encounter for surgical aftercare following surgery on the respiratory system: Secondary | ICD-10-CM | POA: Diagnosis not present

## 2023-07-23 DIAGNOSIS — C799 Secondary malignant neoplasm of unspecified site: Secondary | ICD-10-CM | POA: Insufficient documentation

## 2023-07-23 DIAGNOSIS — I700 Atherosclerosis of aorta: Secondary | ICD-10-CM | POA: Diagnosis not present

## 2023-07-23 DIAGNOSIS — J910 Malignant pleural effusion: Secondary | ICD-10-CM | POA: Diagnosis not present

## 2023-07-23 DIAGNOSIS — Z79620 Long term (current) use of immunosuppressive biologic: Secondary | ICD-10-CM | POA: Diagnosis not present

## 2023-07-23 DIAGNOSIS — Z5111 Encounter for antineoplastic chemotherapy: Secondary | ICD-10-CM | POA: Diagnosis not present

## 2023-07-23 DIAGNOSIS — Z5112 Encounter for antineoplastic immunotherapy: Secondary | ICD-10-CM | POA: Diagnosis not present

## 2023-07-23 DIAGNOSIS — R918 Other nonspecific abnormal finding of lung field: Secondary | ICD-10-CM | POA: Diagnosis not present

## 2023-07-23 DIAGNOSIS — C786 Secondary malignant neoplasm of retroperitoneum and peritoneum: Secondary | ICD-10-CM | POA: Diagnosis not present

## 2023-07-23 DIAGNOSIS — Z4682 Encounter for fitting and adjustment of non-vascular catheter: Secondary | ICD-10-CM | POA: Diagnosis not present

## 2023-07-23 MED ORDER — LIDOCAINE HCL 1 % IJ SOLN
INTRAMUSCULAR | Status: AC
Start: 2023-07-23 — End: ?
  Filled 2023-07-23: qty 20

## 2023-07-23 MED ORDER — OXYCODONE HCL 5 MG PO TABS: 5.0000 mg | ORAL_TABLET | Freq: Four times a day (QID) | ORAL | 0 refills | Status: DC | PRN

## 2023-07-23 NOTE — Assessment & Plan Note (Addendum)
 She has mild intermittent chest wall discomfort likely related to her pleural effusion I refilled her prescription of oxycodone to take as needed

## 2023-07-23 NOTE — Procedures (Signed)
 Ultrasound-guided  therapeutic right thoracentesis performed yielding 950 cc of hazy amber/blood-tinged  fluid. No immediate complications. Follow-up chest x-ray pending. EBL none. Due to pt coughing only the above amount of fluid was removed today.

## 2023-07-23 NOTE — Assessment & Plan Note (Addendum)
 She has rapid reaccumulation of pleural fluid and is symptomatic I recommend urgent thoracentesis I will recheck next week

## 2023-07-23 NOTE — Progress Notes (Signed)
 [facility] OFFICE PROGRESS NOTE  Patient Care Team: Merri Brunette, MD as PCP - General (Internal Medicine) Wyline Mood Alben Spittle, MD as PCP - Cardiology (Cardiology) Paulina Fusi Servando Snare, RN as Oncology Nurse Navigator (Oncology)  Assessment & Plan Metastasis from cancer of uterus [facility]) The patient initially presented in April 2024 with postmenopausal bleeding.  CT imaging revealed stage IV disease with metastatic cancer to the pleural fluid as well as peritoneal metastasis Biopsy review high-grade serous carcinoma, MMR normal, Her2 positive, FISh 3+, MSI stable, High grade serous, PIK3CA pathogenic variant, p53 mutated  She received neoadjuvant chemotherapy with combination of carboplatin, paclitaxel and trastuzumab with excellent response to therapy, followed by interval debulking surgery and then adjuvant chemotherapy, completed by November 2024, followed by maintenance trastuzumab, then had disease recurrence February 2025 with malignant pleural effusion and abdominal lymphadenopathy Repeat fluid cytology confirm HER2 positive disease  She tolerated cycle 1 of carboplatin, paclitaxel and dostarlimab With recurrent pleural effusion, I recommend urgent thoracentesis for symptomatic relief today I plan to see her again next week for further follow-up I am hopeful, with recent treatment, the recurrent pleural effusion will eventually resolve I plan to repeat imaging study after 3 cycles of treatment, around May 2025 Recurrent pleural effusion She has rapid reaccumulation of pleural fluid and is symptomatic I recommend urgent thoracentesis I will recheck next week  Cancer associated pain She has mild intermittent chest wall discomfort likely related to her pleural effusion I refilled her prescription of oxycodone to take as needed  Orders Placed This Encounter  Procedures   US THORACENTESIS ASP PLEURAL SPACE W/IMG GUIDE    Standing Status:   Future    Number of Occurrences:   1     Expected Date:   07/23/2023    Expiration Date:   07/22/2024    Are labs required for specimen collection?:   No    Reason for Exam (SYMPTOM  OR DIAGNOSIS REQUIRED):   recurrent pleural effusion    Preferred imaging location?:   [facility]   DG Chest 2 View    Standing Status:   Future    Expected Date:   07/30/2023    Expiration Date:   07/22/2024    Reason for exam::   recurrent pleural effusion    Preferred imaging location?:   [facility]     Artis Delay, MD  INTERVAL HISTORY: she returns for surveillance followup She tolerated recent chemotherapy well She has some abdominal cramping and vaginal spotting She have some chest discomfort and mild shortness of breath I reviewed her imaging and discussed risk and benefits of repeating thoracentesis  PHYSICAL EXAMINATION: ECOG PERFORMANCE STATUS: 2 - Symptomatic, <50% confined to bed  Vitals:   07/23/23 0929  BP: 119/82  Pulse: 83  Resp: 18  Temp: 98.4 F (36.9 C)  SpO2: 96%   Filed Weights   07/23/23 0929  Weight: 173 lb (78.5 kg)   I performed limited examination Cardiac exam: Regular rate and rhythm, no murmur Pulmonary exam: Reduced breath sound on the right lung base Relevant data reviewed during this visit included chest x-ray

## 2023-07-23 NOTE — Telephone Encounter (Signed)
 Called and spoke with husband and told Rx sent. He verbalized understanding.

## 2023-07-23 NOTE — Telephone Encounter (Signed)
 done

## 2023-07-23 NOTE — Assessment & Plan Note (Addendum)
 The patient initially presented in April 2024 with postmenopausal bleeding.  CT imaging revealed stage IV disease with metastatic cancer to the pleural fluid as well as peritoneal metastasis Biopsy review high-grade serous carcinoma, MMR normal, Her2 positive, FISh 3+, MSI stable, High grade serous, PIK3CA pathogenic variant, p53 mutated  She received neoadjuvant chemotherapy with combination of carboplatin, paclitaxel and trastuzumab with excellent response to therapy, followed by interval debulking surgery and then adjuvant chemotherapy, completed by November 2024, followed by maintenance trastuzumab, then had disease recurrence February 2025 with malignant pleural effusion and abdominal lymphadenopathy Repeat fluid cytology confirm HER2 positive disease  She tolerated cycle 1 of carboplatin, paclitaxel and dostarlimab With recurrent pleural effusion, I recommend urgent thoracentesis for symptomatic relief today I plan to see Maria Avila again next week for further follow-up I am hopeful, with recent treatment, the recurrent pleural effusion will eventually resolve I plan to repeat imaging study after 3 cycles of treatment, around May 2025

## 2023-07-24 ENCOUNTER — Telehealth: Payer: Self-pay | Admitting: Hematology and Oncology

## 2023-07-24 ENCOUNTER — Ambulatory Visit: Payer: Medicare Other | Admitting: Gynecologic Oncology

## 2023-07-24 NOTE — Telephone Encounter (Signed)
 Scheduled appointments per WQ. Talked with the patients husband Mr.Douthit and he is aware of the made appointments for the patient.

## 2023-07-25 ENCOUNTER — Other Ambulatory Visit: Payer: Self-pay

## 2023-07-30 ENCOUNTER — Other Ambulatory Visit (HOSPITAL_COMMUNITY): Payer: Self-pay

## 2023-07-30 ENCOUNTER — Inpatient Hospital Stay: Admitting: Hematology and Oncology

## 2023-07-30 ENCOUNTER — Ambulatory Visit (HOSPITAL_COMMUNITY)
Admission: RE | Admit: 2023-07-30 | Discharge: 2023-07-30 | Disposition: A | Discharge status: Home / Self Care | Source: Ambulatory Visit | Attending: Hematology and Oncology | Admitting: Hematology and Oncology

## 2023-07-30 ENCOUNTER — Ambulatory Visit (HOSPITAL_COMMUNITY)
Admission: RE | Admit: 2023-07-30 | Discharge: 2023-07-30 | Disposition: A | Discharge status: Home / Self Care | Source: Ambulatory Visit | Attending: Radiology | Admitting: Radiology

## 2023-07-30 VITALS — BP 117/81 | HR 61 | Temp 98.9°F | Resp 18 | Ht 62.0 in | Wt 176.2 lb

## 2023-07-30 DIAGNOSIS — C55 Malignant neoplasm of uterus, part unspecified: Secondary | ICD-10-CM | POA: Insufficient documentation

## 2023-07-30 DIAGNOSIS — G893 Neoplasm related pain (acute) (chronic): Secondary | ICD-10-CM | POA: Diagnosis not present

## 2023-07-30 DIAGNOSIS — Z79620 Long term (current) use of immunosuppressive biologic: Secondary | ICD-10-CM | POA: Diagnosis not present

## 2023-07-30 DIAGNOSIS — C799 Secondary malignant neoplasm of unspecified site: Secondary | ICD-10-CM

## 2023-07-30 DIAGNOSIS — Z5111 Encounter for antineoplastic chemotherapy: Secondary | ICD-10-CM | POA: Diagnosis not present

## 2023-07-30 DIAGNOSIS — R918 Other nonspecific abnormal finding of lung field: Secondary | ICD-10-CM | POA: Diagnosis not present

## 2023-07-30 DIAGNOSIS — J9811 Atelectasis: Secondary | ICD-10-CM | POA: Diagnosis not present

## 2023-07-30 DIAGNOSIS — Z48813 Encounter for surgical aftercare following surgery on the respiratory system: Secondary | ICD-10-CM | POA: Diagnosis not present

## 2023-07-30 DIAGNOSIS — I4891 Unspecified atrial fibrillation: Secondary | ICD-10-CM

## 2023-07-30 DIAGNOSIS — Z4682 Encounter for fitting and adjustment of non-vascular catheter: Secondary | ICD-10-CM | POA: Diagnosis not present

## 2023-07-30 DIAGNOSIS — I700 Atherosclerosis of aorta: Secondary | ICD-10-CM | POA: Diagnosis not present

## 2023-07-30 DIAGNOSIS — J910 Malignant pleural effusion: Secondary | ICD-10-CM | POA: Insufficient documentation

## 2023-07-30 DIAGNOSIS — J90 Pleural effusion, not elsewhere classified: Secondary | ICD-10-CM

## 2023-07-30 DIAGNOSIS — C786 Secondary malignant neoplasm of retroperitoneum and peritoneum: Secondary | ICD-10-CM | POA: Diagnosis not present

## 2023-07-30 DIAGNOSIS — Z452 Encounter for adjustment and management of vascular access device: Secondary | ICD-10-CM | POA: Diagnosis not present

## 2023-07-30 DIAGNOSIS — Z5112 Encounter for antineoplastic immunotherapy: Secondary | ICD-10-CM | POA: Diagnosis not present

## 2023-07-30 MED ORDER — LIDOCAINE HCL 1 % IJ SOLN
INTRAMUSCULAR | Status: AC
  Filled 2023-07-30: qty 20

## 2023-07-30 NOTE — Assessment & Plan Note (Addendum)
 She has recent findings of new onset atrial fibrillation and with history of stroke, she is at high risk She was started on warfarin therapy in the hospital Due to unpredictability and high risk of toxicity and narrow therapeutic window of warfarin, I recommend switching her over to apixaban We will get insurance prior authorization and assistance to pay for apixaban Today, we will make copies of her her income statement, bills and additional paperwork to support the application for assistance to pay for apixaban

## 2023-07-30 NOTE — Assessment & Plan Note (Addendum)
 The patient initially presented in April 2024 with postmenopausal bleeding.  CT imaging revealed stage IV disease with metastatic cancer to the pleural fluid as well as peritoneal metastasis Biopsy review high-grade serous carcinoma, MMR normal, Her2 positive, FISh 3+, MSI stable, High grade serous, PIK3CA pathogenic variant, p53 mutated  She received neoadjuvant chemotherapy with combination of carboplatin, paclitaxel and trastuzumab with excellent response to therapy, followed by interval debulking surgery and then adjuvant chemotherapy, completed by November 2024, followed by maintenance trastuzumab, then had disease recurrence February 2025 with malignant pleural effusion and abdominal lymphadenopathy Repeat fluid cytology confirm HER2 positive disease  She tolerated cycle 1 of carboplatin, paclitaxel and dostarlimab With recurrent pleural effusion, I recommend urgent thoracentesis for symptomatic relief today I plan to see her again next week for further follow-up I am hopeful, with recent treatment, the recurrent pleural effusion will eventually resolve I plan to repeat imaging study after 3 cycles of treatment, around May 2025

## 2023-07-30 NOTE — Progress Notes (Signed)
 [facility] OFFICE PROGRESS NOTE  Patient Care Team: Merri Brunette, MD as PCP - General (Internal Medicine) Maisie Fus, MD as PCP - Cardiology (Cardiology) Eduardo Osier, RN as Oncology Nurse Navigator (Oncology)  Assessment & Plan Recurrent pleural effusion She has rapid reaccumulation of pleural fluid and is symptomatic I recommend urgent thoracentesis I will recheck next week  Metastasis from cancer of uterus [facility]) The patient initially presented in April 2024 with postmenopausal bleeding.  CT imaging revealed stage IV disease with metastatic cancer to the pleural fluid as well as peritoneal metastasis Biopsy review high-grade serous carcinoma, MMR normal, Her2 positive, FISh 3+, MSI stable, High grade serous, PIK3CA pathogenic variant, p53 mutated  She received neoadjuvant chemotherapy with combination of carboplatin, paclitaxel and trastuzumab with excellent response to therapy, followed by interval debulking surgery and then adjuvant chemotherapy, completed by November 2024, followed by maintenance trastuzumab, then had disease recurrence February 2025 with malignant pleural effusion and abdominal lymphadenopathy Repeat fluid cytology confirm HER2 positive disease  She tolerated cycle 1 of carboplatin, paclitaxel and dostarlimab With recurrent pleural effusion, I recommend urgent thoracentesis for symptomatic relief today I plan to see her again next week for further follow-up I am hopeful, with recent treatment, the recurrent pleural effusion will eventually resolve I plan to repeat imaging study after 3 cycles of treatment, around May 2025 Atrial fibrillation, unspecified type (HCC) She has recent findings of new onset atrial fibrillation and with history of stroke, she is at high risk She was started on warfarin therapy in the hospital Due to unpredictability and high risk of toxicity and narrow therapeutic window of warfarin, I recommend switching her over to  apixaban We will get insurance prior authorization and assistance to pay for apixaban Today, we will make copies of her her income statement, bills and additional paperwork to support the application for assistance to pay for apixaban Cancer associated pain She has mild intermittent chest wall discomfort likely related to her pleural effusion She will continue her prescription of oxycodone to take as needed  Orders Placed This Encounter  Procedures   US THORACENTESIS ASP PLEURAL SPACE W/IMG GUIDE    Standing Status:   Future    Number of Occurrences:   1    Expected Date:   07/30/2023    Expiration Date:   07/29/2024    Are labs required for specimen collection?:   No    Reason for Exam (SYMPTOM  OR DIAGNOSIS REQUIRED):   recurrent pleural effusion    Preferred imaging location?:   [facility]   DG Chest 2 View    Standing Status:   Future    Expected Date:   08/06/2023    Expiration Date:   07/29/2024    Reason for exam::   recurrent pleural effusion    Preferred imaging location?:   [facility]     Artis Delay, MD  INTERVAL HISTORY: she returns for surveillance followup She felt better but denies cough or shortness of breath She felt that the thoracentesis is helping She denies bleeding Her pain is well-controlled I reviewed chest x-ray today.  She has persistent pleural effusion on exam today I recommend moving her appointment to Thursday to see me next week prior to her chemotherapy in case she need repeat thoracentesis  PHYSICAL EXAMINATION: ECOG PERFORMANCE STATUS: 1 - Symptomatic but completely ambulatory  Vitals:   07/30/23 0844  BP: 117/81  Pulse: 61  Resp: 18  Temp: 98.9 F (37.2  C)  SpO2: 98%   Filed Weights   07/30/23 0844  Weight: 176 lb 3.2 oz (79.9 kg)   Limited examination revealed persistent moderate size pleural effusion on the right lung Relevant data reviewed during this visit included chest x-ray and her income statement

## 2023-07-30 NOTE — Assessment & Plan Note (Addendum)
 She has rapid reaccumulation of pleural fluid and is symptomatic I recommend urgent thoracentesis I will recheck next week

## 2023-07-30 NOTE — Procedures (Signed)
 Ultrasound-guided therapeutic right thoracentesis performed yielding 950 cc of hazy,amber  fluid. No immediate complications. Follow-up chest x-ray pending.EBL < 2 cc.  Due to patient coughing only the above amount of fluid was removed today.

## 2023-07-30 NOTE — Assessment & Plan Note (Addendum)
 She has mild intermittent chest wall discomfort likely related to her pleural effusion She will continue her prescription of oxycodone to take as needed

## 2023-07-31 ENCOUNTER — Ambulatory Visit: Payer: Medicare Other

## 2023-07-31 ENCOUNTER — Ambulatory Visit: Payer: Medicare Other | Admitting: Hematology and Oncology

## 2023-07-31 ENCOUNTER — Other Ambulatory Visit: Payer: Medicare Other

## 2023-08-06 ENCOUNTER — Ambulatory Visit (HOSPITAL_COMMUNITY)
Admission: RE | Admit: 2023-08-06 | Discharge: 2023-08-06 | Disposition: A | Discharge status: Home / Self Care | Source: Ambulatory Visit | Attending: Hematology and Oncology

## 2023-08-06 ENCOUNTER — Ambulatory Visit (HOSPITAL_COMMUNITY)
Admission: RE | Admit: 2023-08-06 | Discharge: 2023-08-06 | Disposition: A | Discharge status: Home / Self Care | Source: Ambulatory Visit | Attending: Radiology | Admitting: Radiology

## 2023-08-06 ENCOUNTER — Ambulatory Visit (HOSPITAL_COMMUNITY)
Admission: RE | Admit: 2023-08-06 | Discharge: 2023-08-06 | Disposition: A | Discharge status: Home / Self Care | Source: Ambulatory Visit | Attending: Hematology and Oncology | Admitting: Hematology and Oncology

## 2023-08-06 ENCOUNTER — Inpatient Hospital Stay: Attending: Gynecologic Oncology

## 2023-08-06 ENCOUNTER — Inpatient Hospital Stay (HOSPITAL_BASED_OUTPATIENT_CLINIC_OR_DEPARTMENT_OTHER): Admitting: Hematology and Oncology

## 2023-08-06 ENCOUNTER — Encounter: Payer: Self-pay | Admitting: Hematology and Oncology

## 2023-08-06 VITALS — BP 126/76 | HR 67 | Temp 98.1°F | Resp 17 | Ht 62.0 in | Wt 177.0 lb

## 2023-08-06 DIAGNOSIS — Z5112 Encounter for antineoplastic immunotherapy: Secondary | ICD-10-CM | POA: Diagnosis not present

## 2023-08-06 DIAGNOSIS — Z48813 Encounter for surgical aftercare following surgery on the respiratory system: Secondary | ICD-10-CM | POA: Diagnosis not present

## 2023-08-06 DIAGNOSIS — Z5111 Encounter for antineoplastic chemotherapy: Secondary | ICD-10-CM | POA: Insufficient documentation

## 2023-08-06 DIAGNOSIS — J910 Malignant pleural effusion: Secondary | ICD-10-CM | POA: Diagnosis not present

## 2023-08-06 DIAGNOSIS — D61818 Other pancytopenia: Secondary | ICD-10-CM

## 2023-08-06 DIAGNOSIS — C55 Malignant neoplasm of uterus, part unspecified: Secondary | ICD-10-CM | POA: Insufficient documentation

## 2023-08-06 DIAGNOSIS — J90 Pleural effusion, not elsewhere classified: Secondary | ICD-10-CM

## 2023-08-06 DIAGNOSIS — Z4682 Encounter for fitting and adjustment of non-vascular catheter: Secondary | ICD-10-CM | POA: Diagnosis not present

## 2023-08-06 DIAGNOSIS — I4891 Unspecified atrial fibrillation: Secondary | ICD-10-CM | POA: Diagnosis not present

## 2023-08-06 DIAGNOSIS — C799 Secondary malignant neoplasm of unspecified site: Secondary | ICD-10-CM | POA: Diagnosis not present

## 2023-08-06 DIAGNOSIS — C786 Secondary malignant neoplasm of retroperitoneum and peritoneum: Secondary | ICD-10-CM | POA: Insufficient documentation

## 2023-08-06 DIAGNOSIS — Z79620 Long term (current) use of immunosuppressive biologic: Secondary | ICD-10-CM | POA: Diagnosis not present

## 2023-08-06 DIAGNOSIS — R918 Other nonspecific abnormal finding of lung field: Secondary | ICD-10-CM | POA: Diagnosis not present

## 2023-08-06 DIAGNOSIS — I700 Atherosclerosis of aorta: Secondary | ICD-10-CM | POA: Diagnosis not present

## 2023-08-06 LAB — CMP (CANCER CENTER ONLY)
ALT: 9 U/L (ref 0–44)
AST: 14 U/L — ABNORMAL LOW (ref 15–41)
Albumin: 4 g/dL (ref 3.5–5.0)
Alkaline Phosphatase: 48 U/L (ref 38–126)
Anion gap: 8 (ref 5–15)
BUN: 9 mg/dL (ref 8–23)
CO2: 27 mmol/L (ref 22–32)
Calcium: 9.5 mg/dL (ref 8.9–10.3)
Chloride: 105 mmol/L (ref 98–111)
Creatinine: 0.66 mg/dL (ref 0.44–1.00)
GFR, Estimated: >60 mL/min (ref >60)
Glucose, Bld: 98 mg/dL (ref 70–99)
Potassium: 3.6 mmol/L (ref 3.5–5.1)
Sodium: 140 mmol/L (ref 135–145)
Total Bilirubin: 0.3 mg/dL (ref 0.0–1.2)
Total Protein: 6.9 g/dL (ref 6.5–8.1)

## 2023-08-06 LAB — CBC WITH DIFFERENTIAL (CANCER CENTER ONLY)
Abs Immature Granulocytes: 0.01 10*3/uL (ref 0.00–0.07)
Basophils Absolute: 0 10*3/uL (ref 0.0–0.1)
Basophils Relative: 0 %
Eosinophils Absolute: 0 10*3/uL (ref 0.0–0.5)
Eosinophils Relative: 1 %
HCT: 31.4 % — ABNORMAL LOW (ref 36.0–46.0)
Hemoglobin: 10.6 g/dL — ABNORMAL LOW (ref 12.0–15.0)
Immature Granulocytes: 0 %
Lymphocytes Relative: 56 %
Lymphs Abs: 1.5 10*3/uL (ref 0.7–4.0)
MCH: 32.8 pg (ref 26.0–34.0)
MCHC: 33.8 g/dL (ref 30.0–36.0)
MCV: 97.2 fL (ref 80.0–100.0)
Monocytes Absolute: 0.2 10*3/uL (ref 0.1–1.0)
Monocytes Relative: 6 %
Neutro Abs: 1 10*3/uL — ABNORMAL LOW (ref 1.7–7.7)
Neutrophils Relative %: 37 %
Platelet Count: 95 10*3/uL — ABNORMAL LOW (ref 150–400)
RBC: 3.23 MIL/uL — ABNORMAL LOW (ref 3.87–5.11)
RDW: 12.9 % (ref 11.5–15.5)
WBC Count: 2.7 10*3/uL — ABNORMAL LOW (ref 4.0–10.5)
nRBC: 0 % (ref 0.0–0.2)

## 2023-08-06 MED ORDER — LIDOCAINE HCL 1 % IJ SOLN
INTRAMUSCULAR | Status: AC
  Filled 2023-08-06: qty 20

## 2023-08-06 MED ORDER — SODIUM CHLORIDE 0.9% FLUSH
10.0000 mL | Freq: Once | INTRAVENOUS | Status: AC
  Administered 2023-08-06: 10 mL

## 2023-08-06 MED ORDER — HEPARIN SOD (PORK) LOCK FLUSH 100 UNIT/ML IV SOLN
500.0000 [IU] | Freq: Once | INTRAVENOUS | Status: AC
  Administered 2023-08-06: 500 [IU]

## 2023-08-06 MED FILL — Fosaprepitant Dimeglumine For IV Infusion 150 MG (Base Eq): INTRAVENOUS | Qty: 5 | Status: AC

## 2023-08-06 NOTE — Assessment & Plan Note (Addendum)
 She has recent findings of new onset atrial fibrillation and with history of stroke, she is at high risk She was started on warfarin therapy in the hospital Due to unpredictability and high risk of toxicity and narrow therapeutic window of warfarin, I recommend switching her over to apixaban We will get insurance prior authorization and assistance to pay for apixaban

## 2023-08-06 NOTE — Assessment & Plan Note (Addendum)
 She has significant pancytopenia from recent treatment She does not tolerate treatment well based on current findings I plan minor dose reduction We will proceed with treatment despite abnormal CBC

## 2023-08-06 NOTE — Progress Notes (Signed)
 [facility] OFFICE PROGRESS NOTE  Patient Care Team: Merri Brunette, MD as PCP - General (Internal Medicine) Maisie Fus, MD as PCP - Cardiology (Cardiology) Eduardo Osier, RN as Oncology Nurse Navigator (Oncology)  Assessment & Plan Recurrent pleural effusion She has rapid reaccumulation of pleural fluid and is symptomatic I recommend urgent thoracentesis I will recheck next week  Metastasis from cancer of uterus [facility]) The patient initially presented in April 2024 with postmenopausal bleeding.  CT imaging revealed stage IV disease with metastatic cancer to the pleural fluid as well as peritoneal metastasis Biopsy review high-grade serous carcinoma, MMR normal, Her2 positive, FISh 3+, MSI stable, High grade serous, PIK3CA pathogenic variant, p53 mutated  She received neoadjuvant chemotherapy with combination of carboplatin, paclitaxel and trastuzumab with excellent response to therapy, followed by interval debulking surgery and then adjuvant chemotherapy, completed by November 2024, followed by maintenance trastuzumab, then had disease recurrence February 2025 with malignant pleural effusion and abdominal lymphadenopathy Repeat fluid cytology confirmed HER2 positive disease  She tolerated cycle 1 of carboplatin, paclitaxel and dostarlimab With recurrent pleural effusion, I recommend urgent thoracentesis for symptomatic relief today I plan to see her again next week for further follow-up I am hopeful, with recent treatment, the recurrent pleural effusion will eventually resolve If not, we will have to consider placing Pleurx catheter I plan to repeat imaging study after 3 cycles of treatment, around May 2025 Pancytopenia, acquired [facility]) She has significant pancytopenia from recent treatment She does not tolerate treatment well based on current findings I plan minor dose reduction We will proceed with treatment despite abnormal CBC Atrial fibrillation, unspecified type  (HCC) She has recent findings of new onset atrial fibrillation and with history of stroke, she is at high risk She was started on warfarin therapy in the hospital Due to unpredictability and high risk of toxicity and narrow therapeutic window of warfarin, I recommend switching her over to apixaban We will get insurance prior authorization and assistance to pay for apixaban  Orders Placed This Encounter  Procedures   US THORACENTESIS ASP PLEURAL SPACE W/IMG GUIDE    Standing Status:   Future    Expected Date:   08/06/2023    Expiration Date:   08/05/2024    Are labs required for specimen collection?:   No    Reason for Exam (SYMPTOM  OR DIAGNOSIS REQUIRED):   recurrent pleural effusion    Preferred imaging location?:   [facility]   DG Chest 2 View    Standing Status:   Future    Expected Date:   08/13/2023    Expiration Date:   08/05/2024    Reason for exam::   recurrent pleural effusion    Preferred imaging location?:   [facility]     Artis Delay, MD  INTERVAL HISTORY: she returns for treatment follow-up Complications related to previous cycle of chemotherapy included pancytopenia, and recurrent pleural effusion with symptoms of shortness of breath  PHYSICAL EXAMINATION: ECOG PERFORMANCE STATUS: 1 - Symptomatic but completely ambulatory  Vitals:   08/06/23 0929  BP: 126/76  Pulse: 67  Resp: 17  Temp: 98.1 F (36.7 C)  SpO2: 97%   Filed Weights   08/06/23 0929  Weight: 177 lb (80.3 kg)    Relevant data reviewed during this visit included CBC and CMP I also reviewed multiple chest x-ray with the patient and family

## 2023-08-06 NOTE — Assessment & Plan Note (Addendum)
 She has rapid reaccumulation of pleural fluid and is symptomatic I recommend urgent thoracentesis I will recheck next week

## 2023-08-06 NOTE — Procedures (Signed)
 Ultrasound-guided  therapeutic right thoracentesis performed yielding 950 cc of yellow  fluid. No immediate complications. Follow-up chest x-ray pending. EBL<  2 cc.

## 2023-08-06 NOTE — Assessment & Plan Note (Addendum)
 The patient initially presented in April 2024 with postmenopausal bleeding.  CT imaging revealed stage IV disease with metastatic cancer to the pleural fluid as well as peritoneal metastasis Biopsy review high-grade serous carcinoma, MMR normal, Her2 positive, FISh 3+, MSI stable, High grade serous, PIK3CA pathogenic variant, p53 mutated  She received neoadjuvant chemotherapy with combination of carboplatin, paclitaxel and trastuzumab with excellent response to therapy, followed by interval debulking surgery and then adjuvant chemotherapy, completed by November 2024, followed by maintenance trastuzumab, then had disease recurrence February 2025 with malignant pleural effusion and abdominal lymphadenopathy Repeat fluid cytology confirmed HER2 positive disease  She tolerated cycle 1 of carboplatin, paclitaxel and dostarlimab With recurrent pleural effusion, I recommend urgent thoracentesis for symptomatic relief today I plan to see her again next week for further follow-up I am hopeful, with recent treatment, the recurrent pleural effusion will eventually resolve If not, we will have to consider placing Pleurx catheter I plan to repeat imaging study after 3 cycles of treatment, around May 2025

## 2023-08-07 ENCOUNTER — Ambulatory Visit: Admitting: Hematology and Oncology

## 2023-08-07 ENCOUNTER — Other Ambulatory Visit: Payer: Self-pay

## 2023-08-07 ENCOUNTER — Telehealth: Payer: Self-pay

## 2023-08-07 ENCOUNTER — Inpatient Hospital Stay

## 2023-08-07 ENCOUNTER — Other Ambulatory Visit

## 2023-08-07 VITALS — BP 126/69 | HR 67 | Temp 98.4°F | Resp 16 | Wt 177.0 lb

## 2023-08-07 DIAGNOSIS — Z5111 Encounter for antineoplastic chemotherapy: Secondary | ICD-10-CM | POA: Diagnosis not present

## 2023-08-07 DIAGNOSIS — C786 Secondary malignant neoplasm of retroperitoneum and peritoneum: Secondary | ICD-10-CM | POA: Diagnosis not present

## 2023-08-07 DIAGNOSIS — C55 Malignant neoplasm of uterus, part unspecified: Secondary | ICD-10-CM

## 2023-08-07 DIAGNOSIS — Z5112 Encounter for antineoplastic immunotherapy: Secondary | ICD-10-CM | POA: Diagnosis not present

## 2023-08-07 DIAGNOSIS — Z79620 Long term (current) use of immunosuppressive biologic: Secondary | ICD-10-CM | POA: Diagnosis not present

## 2023-08-07 DIAGNOSIS — J910 Malignant pleural effusion: Secondary | ICD-10-CM | POA: Diagnosis not present

## 2023-08-07 MED ORDER — SODIUM CHLORIDE 0.9 % IV SOLN
INTRAVENOUS | Status: DC
Start: 2023-08-07 — End: 2023-08-07

## 2023-08-07 MED ORDER — FAMOTIDINE IN NACL 20-0.9 MG/50ML-% IV SOLN
20.0000 mg | Freq: Once | INTRAVENOUS | Status: AC
  Administered 2023-08-07: 20 mg via INTRAVENOUS
  Filled 2023-08-07: qty 50

## 2023-08-07 MED ORDER — HEPARIN SOD (PORK) LOCK FLUSH 100 UNIT/ML IV SOLN
500.0000 [IU] | Freq: Once | INTRAVENOUS | Status: AC | PRN
  Administered 2023-08-07: 500 [IU]

## 2023-08-07 MED ORDER — PACLITAXEL CHEMO INJECTION 300 MG/50ML
105.0000 mg/m2 | Freq: Once | INTRAVENOUS | Status: AC
  Administered 2023-08-07: 198 mg via INTRAVENOUS
  Filled 2023-08-07: qty 33

## 2023-08-07 MED ORDER — SODIUM CHLORIDE 0.9 % IV SOLN
500.0000 mg | Freq: Once | INTRAVENOUS | Status: AC
  Administered 2023-08-07: 500 mg via INTRAVENOUS
  Filled 2023-08-07: qty 10

## 2023-08-07 MED ORDER — SODIUM CHLORIDE 0.9% FLUSH
10.0000 mL | INTRAVENOUS | Status: DC | PRN
  Administered 2023-08-07: 10 mL

## 2023-08-07 MED ORDER — SODIUM CHLORIDE 0.9 % IV SOLN
150.0000 mg | Freq: Once | INTRAVENOUS | Status: AC
  Administered 2023-08-07: 150 mg via INTRAVENOUS
  Filled 2023-08-07: qty 150

## 2023-08-07 MED ORDER — DEXAMETHASONE SODIUM PHOSPHATE 10 MG/ML IJ SOLN
10.0000 mg | Freq: Once | INTRAMUSCULAR | Status: AC
  Administered 2023-08-07: 10 mg via INTRAVENOUS
  Filled 2023-08-07: qty 1

## 2023-08-07 MED ORDER — SODIUM CHLORIDE 0.9 % IV SOLN
355.6000 mg | Freq: Once | INTRAVENOUS | Status: AC
  Administered 2023-08-07: 360 mg via INTRAVENOUS
  Filled 2023-08-07: qty 36

## 2023-08-07 MED ORDER — PALONOSETRON HCL INJECTION 0.25 MG/5ML
0.2500 mg | Freq: Once | INTRAVENOUS | Status: AC
  Administered 2023-08-07: 0.25 mg via INTRAVENOUS
  Filled 2023-08-07: qty 5

## 2023-08-07 MED ORDER — CETIRIZINE HCL 10 MG/ML IV SOLN
10.0000 mg | Freq: Once | INTRAVENOUS | Status: AC
  Administered 2023-08-07: 10 mg via INTRAVENOUS
  Filled 2023-08-07: qty 1

## 2023-08-07 NOTE — Telephone Encounter (Signed)
 Called to see if she needs any refill. She does not need any refills at this time and has 10 Eliquis left. She will call the office back if needed.

## 2023-08-07 NOTE — Patient Instructions (Signed)
 [facility] - A DEPT OF [facility]  Discharge Instructions: Thank you for choosing [facility] to provide your oncology and hematology care.   If you have a lab appointment with the Cancer Center, please go directly to the Cancer Center and check in at the registration area.   Wear comfortable clothing and clothing appropriate for easy access to any Portacath or PICC line.   We strive to give you quality time with your provider. You may need to reschedule your appointment if you arrive late (15 or more minutes).  Arriving late affects you and other patients whose appointments are after yours.  Also, if you miss three or more appointments without notifying the office, you may be dismissed from the clinic at the provider's discretion.      For prescription refill requests, have your pharmacy contact our office and allow 72 hours for refills to be completed.    Today you received the following chemotherapy and/or immunotherapy agents: Jemperli, Taxol, Carboplatin      To help prevent nausea and vomiting after your treatment, we encourage you to take your nausea medication as directed.  BELOW ARE SYMPTOMS THAT SHOULD BE REPORTED IMMEDIATELY: *FEVER GREATER THAN 100.4 F (38 C) OR HIGHER *CHILLS OR SWEATING *NAUSEA AND VOMITING THAT IS NOT CONTROLLED WITH YOUR NAUSEA MEDICATION *UNUSUAL SHORTNESS OF BREATH *UNUSUAL BRUISING OR BLEEDING *URINARY PROBLEMS (pain or burning when urinating, or frequent urination) *BOWEL PROBLEMS (unusual diarrhea, constipation, pain near the anus) TENDERNESS IN MOUTH AND THROAT WITH OR WITHOUT PRESENCE OF ULCERS (sore throat, sores in mouth, or a toothache) UNUSUAL RASH, SWELLING OR PAIN  UNUSUAL VAGINAL DISCHARGE OR ITCHING   Items with * indicate a potential emergency and should be followed up as soon as possible or go to the Emergency Department if any problems should occur.  Please show the CHEMOTHERAPY ALERT  CARD or IMMUNOTHERAPY ALERT CARD at check-in to the Emergency Department and triage nurse.  Should you have questions after your visit or need to cancel or reschedule your appointment, please contact [facility] - A DEPT OF [facility]  Dept: [phone]  and follow the prompts.  Office hours are 8:00 a.m. to 4:30 p.m. Monday - Friday. Please note that voicemails left after 4:00 p.m. may not be returned until the following business day.  We are closed weekends and major holidays. You have access to a nurse at all times for urgent questions. Please call the main number to the clinic Dept: [phone] and follow the prompts.   For any non-urgent questions, you may also contact your provider using MyChart. We now offer e-Visits for anyone 36 and older to request care online for non-urgent symptoms. For details visit [email or web address].   Also download the MyChart app! Go to the app store, search "MyChart", open the app, select [place], and log in with your MyChart username and password.

## 2023-08-11 ENCOUNTER — Other Ambulatory Visit: Payer: Self-pay

## 2023-08-11 ENCOUNTER — Telehealth: Payer: Self-pay

## 2023-08-11 ENCOUNTER — Other Ambulatory Visit (HOSPITAL_COMMUNITY): Payer: Self-pay

## 2023-08-11 DIAGNOSIS — I82552 Chronic embolism and thrombosis of left peroneal vein: Secondary | ICD-10-CM | POA: Diagnosis not present

## 2023-08-11 DIAGNOSIS — Z0000 Encounter for general adult medical examination without abnormal findings: Secondary | ICD-10-CM | POA: Diagnosis not present

## 2023-08-11 DIAGNOSIS — D61818 Other pancytopenia: Secondary | ICD-10-CM | POA: Diagnosis not present

## 2023-08-11 DIAGNOSIS — E118 Type 2 diabetes mellitus with unspecified complications: Secondary | ICD-10-CM | POA: Diagnosis not present

## 2023-08-11 DIAGNOSIS — I10 Essential (primary) hypertension: Secondary | ICD-10-CM | POA: Diagnosis not present

## 2023-08-11 DIAGNOSIS — Z7902 Long term (current) use of antithrombotics/antiplatelets: Secondary | ICD-10-CM | POA: Diagnosis not present

## 2023-08-11 DIAGNOSIS — C782 Secondary malignant neoplasm of pleura: Secondary | ICD-10-CM | POA: Diagnosis not present

## 2023-08-11 DIAGNOSIS — I480 Paroxysmal atrial fibrillation: Secondary | ICD-10-CM | POA: Diagnosis not present

## 2023-08-11 DIAGNOSIS — R9431 Abnormal electrocardiogram [ECG] [EKG]: Secondary | ICD-10-CM | POA: Diagnosis not present

## 2023-08-11 NOTE — Telephone Encounter (Signed)
 Called husband regarding patient assistance foundation application for Eliquis. The office received a fax today and Kimberli is not eligible for to receive Eliquis thru the foundation. He verbalized understanding and the office sent a message to social worker to see if they have any assistance.

## 2023-08-13 ENCOUNTER — Inpatient Hospital Stay: Admitting: Hematology and Oncology

## 2023-08-13 ENCOUNTER — Ambulatory Visit (HOSPITAL_COMMUNITY)
Admission: RE | Admit: 2023-08-13 | Discharge: 2023-08-13 | Disposition: A | Discharge status: Home / Self Care | Source: Ambulatory Visit | Attending: Hematology and Oncology | Admitting: Hematology and Oncology

## 2023-08-13 ENCOUNTER — Encounter: Payer: Self-pay | Admitting: Hematology and Oncology

## 2023-08-13 VITALS — BP 133/84 | HR 71 | Temp 98.6°F | Resp 18 | Ht 62.0 in | Wt 172.2 lb

## 2023-08-13 DIAGNOSIS — J90 Pleural effusion, not elsewhere classified: Secondary | ICD-10-CM | POA: Diagnosis not present

## 2023-08-13 DIAGNOSIS — Z452 Encounter for adjustment and management of vascular access device: Secondary | ICD-10-CM | POA: Diagnosis not present

## 2023-08-13 DIAGNOSIS — C786 Secondary malignant neoplasm of retroperitoneum and peritoneum: Secondary | ICD-10-CM | POA: Diagnosis not present

## 2023-08-13 DIAGNOSIS — Z5111 Encounter for antineoplastic chemotherapy: Secondary | ICD-10-CM | POA: Diagnosis not present

## 2023-08-13 DIAGNOSIS — C799 Secondary malignant neoplasm of unspecified site: Secondary | ICD-10-CM

## 2023-08-13 DIAGNOSIS — J9811 Atelectasis: Secondary | ICD-10-CM | POA: Diagnosis not present

## 2023-08-13 DIAGNOSIS — C55 Malignant neoplasm of uterus, part unspecified: Secondary | ICD-10-CM | POA: Diagnosis not present

## 2023-08-13 DIAGNOSIS — J910 Malignant pleural effusion: Secondary | ICD-10-CM | POA: Diagnosis not present

## 2023-08-13 DIAGNOSIS — Z79620 Long term (current) use of immunosuppressive biologic: Secondary | ICD-10-CM | POA: Diagnosis not present

## 2023-08-13 DIAGNOSIS — Z5112 Encounter for antineoplastic immunotherapy: Secondary | ICD-10-CM | POA: Diagnosis not present

## 2023-08-13 NOTE — Progress Notes (Signed)
 [facility] OFFICE PROGRESS NOTE  Patient Care Team: Merri Brunette, MD as PCP - General (Internal Medicine) Wyline Mood Alben Spittle, MD as PCP - Cardiology (Cardiology) Paulina Fusi Servando Snare, RN as Oncology Nurse Navigator (Oncology)  Assessment & Plan Metastasis from cancer of uterus [facility]) The patient initially presented in April 2024 with postmenopausal bleeding.  CT imaging revealed stage IV disease with metastatic cancer to the pleural fluid as well as peritoneal metastasis Biopsy review high-grade serous carcinoma, MMR normal, Her2 positive, FISh 3+, MSI stable, High grade serous, PIK3CA pathogenic variant, p53 mutated  She received neoadjuvant chemotherapy with combination of carboplatin, paclitaxel and trastuzumab with excellent response to therapy, followed by interval debulking surgery and then adjuvant chemotherapy, completed by November 2024, followed by maintenance trastuzumab, then had disease recurrence February 2025 with malignant pleural effusion and abdominal lymphadenopathy Repeat fluid cytology confirmed HER2 positive disease  She tolerated cycle 2 of treatment well without side effects I plan to repeat imaging study after 3 cycles of treatment, around May 2025 Recurrent pleural effusion I reviewed chest x-ray today Even though she has reaccumulation of pleural effusion, she is not symptomatic and amount of fluid is less compared to prior visits I recommend observation only She is not symptomatic and oxygen saturation on room air is 97%  Orders Placed This Encounter  Procedures   DG Chest 2 View    Standing Status:   Future    Expected Date:   08/27/2023    Expiration Date:   08/12/2024    Reason for exam::   recurrent pleural effusion    Preferred imaging location?:   [facility]   Magnesium    Standing Status:   Future    Expected Date:   08/27/2023    Expiration Date:   08/12/2024     Artis Delay, MD  INTERVAL HISTORY: she returns for surveillance  follow-up after cycle 2 of treatment She tolerated recent treatment well Denies chest pain or shortness of breath  PHYSICAL EXAMINATION: ECOG PERFORMANCE STATUS: 1 - Symptomatic but completely ambulatory  Vitals:   08/13/23 1019  BP: 133/84  Pulse: 71  Resp: 18  Temp: 98.6 F (37 C)  SpO2: 97%   Filed Weights   08/13/23 1019  Weight: 172 lb 3.2 oz (78.1 kg)    Relevant data reviewed during this visit included chest x-ray

## 2023-08-13 NOTE — Assessment & Plan Note (Addendum)
 The patient initially presented in April 2024 with postmenopausal bleeding.  CT imaging revealed stage IV disease with metastatic cancer to the pleural fluid as well as peritoneal metastasis Biopsy review high-grade serous carcinoma, MMR normal, Her2 positive, FISh 3+, MSI stable, High grade serous, PIK3CA pathogenic variant, p53 mutated  She received neoadjuvant chemotherapy with combination of carboplatin, paclitaxel and trastuzumab with excellent response to therapy, followed by interval debulking surgery and then adjuvant chemotherapy, completed by November 2024, followed by maintenance trastuzumab, then had disease recurrence February 2025 with malignant pleural effusion and abdominal lymphadenopathy Repeat fluid cytology confirmed HER2 positive disease  She tolerated cycle 2 of treatment well without side effects I plan to repeat imaging study after 3 cycles of treatment, around May 2025

## 2023-08-13 NOTE — Assessment & Plan Note (Addendum)
 I reviewed chest x-ray today Even though she has reaccumulation of pleural effusion, she is not symptomatic and amount of fluid is less compared to prior visits I recommend observation only She is not symptomatic and oxygen saturation on room air is 97%

## 2023-08-17 ENCOUNTER — Other Ambulatory Visit: Payer: Self-pay

## 2023-08-17 LAB — ACID FAST CULTURE WITH REFLEXED SENSITIVITIES (MYCOBACTERIA): Acid Fast Culture: NEGATIVE

## 2023-08-26 ENCOUNTER — Encounter: Payer: Self-pay | Admitting: Hematology and Oncology

## 2023-08-27 ENCOUNTER — Ambulatory Visit (HOSPITAL_COMMUNITY)
Admission: RE | Admit: 2023-08-27 | Discharge: 2023-08-27 | Disposition: A | Discharge status: Home / Self Care | Source: Ambulatory Visit | Attending: Hematology and Oncology

## 2023-08-27 ENCOUNTER — Ambulatory Visit (HOSPITAL_COMMUNITY)
Admission: RE | Admit: 2023-08-27 | Discharge: 2023-08-27 | Disposition: A | Discharge status: Home / Self Care | Source: Ambulatory Visit | Attending: Urology

## 2023-08-27 ENCOUNTER — Encounter: Payer: Self-pay | Admitting: Hematology and Oncology

## 2023-08-27 ENCOUNTER — Inpatient Hospital Stay

## 2023-08-27 ENCOUNTER — Ambulatory Visit (HOSPITAL_COMMUNITY)
Admission: RE | Admit: 2023-08-27 | Discharge: 2023-08-27 | Disposition: A | Discharge status: Home / Self Care | Source: Ambulatory Visit | Attending: Hematology and Oncology | Admitting: Hematology and Oncology

## 2023-08-27 ENCOUNTER — Inpatient Hospital Stay (HOSPITAL_BASED_OUTPATIENT_CLINIC_OR_DEPARTMENT_OTHER): Admitting: Hematology and Oncology

## 2023-08-27 ENCOUNTER — Telehealth: Payer: Self-pay

## 2023-08-27 ENCOUNTER — Encounter (HOSPITAL_COMMUNITY): Payer: Self-pay

## 2023-08-27 VITALS — BP 138/85 | HR 88 | Temp 98.0°F | Resp 18 | Ht 62.0 in | Wt 172.4 lb

## 2023-08-27 VITALS — BP 134/83

## 2023-08-27 DIAGNOSIS — C55 Malignant neoplasm of uterus, part unspecified: Secondary | ICD-10-CM

## 2023-08-27 DIAGNOSIS — Z4682 Encounter for fitting and adjustment of non-vascular catheter: Secondary | ICD-10-CM | POA: Diagnosis not present

## 2023-08-27 DIAGNOSIS — I700 Atherosclerosis of aorta: Secondary | ICD-10-CM | POA: Diagnosis not present

## 2023-08-27 DIAGNOSIS — C786 Secondary malignant neoplasm of retroperitoneum and peritoneum: Secondary | ICD-10-CM | POA: Diagnosis not present

## 2023-08-27 DIAGNOSIS — I4891 Unspecified atrial fibrillation: Secondary | ICD-10-CM

## 2023-08-27 DIAGNOSIS — G893 Neoplasm related pain (acute) (chronic): Secondary | ICD-10-CM

## 2023-08-27 DIAGNOSIS — Z5112 Encounter for antineoplastic immunotherapy: Secondary | ICD-10-CM | POA: Diagnosis not present

## 2023-08-27 DIAGNOSIS — C799 Secondary malignant neoplasm of unspecified site: Secondary | ICD-10-CM | POA: Diagnosis not present

## 2023-08-27 DIAGNOSIS — J910 Malignant pleural effusion: Secondary | ICD-10-CM | POA: Diagnosis not present

## 2023-08-27 DIAGNOSIS — Z79620 Long term (current) use of immunosuppressive biologic: Secondary | ICD-10-CM | POA: Diagnosis not present

## 2023-08-27 DIAGNOSIS — D61818 Other pancytopenia: Secondary | ICD-10-CM

## 2023-08-27 DIAGNOSIS — Z48813 Encounter for surgical aftercare following surgery on the respiratory system: Secondary | ICD-10-CM | POA: Diagnosis not present

## 2023-08-27 DIAGNOSIS — J869 Pyothorax without fistula: Secondary | ICD-10-CM

## 2023-08-27 DIAGNOSIS — J90 Pleural effusion, not elsewhere classified: Secondary | ICD-10-CM | POA: Diagnosis not present

## 2023-08-27 DIAGNOSIS — Z5111 Encounter for antineoplastic chemotherapy: Secondary | ICD-10-CM | POA: Diagnosis not present

## 2023-08-27 DIAGNOSIS — R918 Other nonspecific abnormal finding of lung field: Secondary | ICD-10-CM | POA: Diagnosis not present

## 2023-08-27 LAB — CMP (CANCER CENTER ONLY)
ALT: 7 U/L (ref 0–44)
AST: 14 U/L — ABNORMAL LOW (ref 15–41)
Albumin: 4.1 g/dL (ref 3.5–5.0)
Alkaline Phosphatase: 50 U/L (ref 38–126)
Anion gap: 5 (ref 5–15)
BUN: 12 mg/dL (ref 8–23)
CO2: 29 mmol/L (ref 22–32)
Calcium: 9.7 mg/dL (ref 8.9–10.3)
Chloride: 104 mmol/L (ref 98–111)
Creatinine: 0.62 mg/dL (ref 0.44–1.00)
GFR, Estimated: >60 mL/min (ref >60)
Glucose, Bld: 84 mg/dL (ref 70–99)
Potassium: 3.1 mmol/L — ABNORMAL LOW (ref 3.5–5.1)
Sodium: 138 mmol/L (ref 135–145)
Total Bilirubin: 0.4 mg/dL (ref 0.0–1.2)
Total Protein: 7.2 g/dL (ref 6.5–8.1)

## 2023-08-27 LAB — CBC WITH DIFFERENTIAL (CANCER CENTER ONLY)
Abs Immature Granulocytes: 0 10*3/uL (ref 0.00–0.07)
Basophils Absolute: 0 10*3/uL (ref 0.0–0.1)
Basophils Relative: 0 %
Eosinophils Absolute: 0 10*3/uL (ref 0.0–0.5)
Eosinophils Relative: 0 %
HCT: 27.3 % — ABNORMAL LOW (ref 36.0–46.0)
Hemoglobin: 9.5 g/dL — ABNORMAL LOW (ref 12.0–15.0)
Immature Granulocytes: 0 %
Lymphocytes Relative: 59 %
Lymphs Abs: 1.4 10*3/uL (ref 0.7–4.0)
MCH: 33.2 pg (ref 26.0–34.0)
MCHC: 34.8 g/dL (ref 30.0–36.0)
MCV: 95.5 fL (ref 80.0–100.0)
Monocytes Absolute: 0.2 10*3/uL (ref 0.1–1.0)
Monocytes Relative: 9 %
Neutro Abs: 0.8 10*3/uL — ABNORMAL LOW (ref 1.7–7.7)
Neutrophils Relative %: 32 %
Platelet Count: 75 10*3/uL — ABNORMAL LOW (ref 150–400)
RBC: 2.86 MIL/uL — ABNORMAL LOW (ref 3.87–5.11)
RDW: 13.8 % (ref 11.5–15.5)
WBC Count: 2.3 10*3/uL — ABNORMAL LOW (ref 4.0–10.5)
nRBC: 0 % (ref 0.0–0.2)

## 2023-08-27 LAB — TSH: TSH: 2.08 u[IU]/mL (ref 0.350–4.500)

## 2023-08-27 LAB — MAGNESIUM: Magnesium: 0.9 mg/dL — CL (ref 1.7–2.4)

## 2023-08-27 MED ORDER — OXYCODONE HCL 5 MG PO TABS: 5.0000 mg | ORAL_TABLET | Freq: Four times a day (QID) | ORAL | 0 refills | Status: DC | PRN

## 2023-08-27 MED ORDER — SODIUM CHLORIDE 0.9% FLUSH
10.0000 mL | Freq: Once | INTRAVENOUS | Status: AC
  Administered 2023-08-27: 10 mL

## 2023-08-27 MED ORDER — LIDOCAINE HCL 1 % IJ SOLN
INTRAMUSCULAR | Status: AC
  Filled 2023-08-27: qty 20

## 2023-08-27 MED ORDER — MAGNESIUM OXIDE -MG SUPPLEMENT 400 (240 MG) MG PO TABS: 400.0000 mg | ORAL_TABLET | Freq: Two times a day (BID) | ORAL | 0 refills | Status: DC

## 2023-08-27 MED ORDER — HEPARIN SOD (PORK) LOCK FLUSH 100 UNIT/ML IV SOLN
500.0000 [IU] | Freq: Once | INTRAVENOUS | Status: AC
  Administered 2023-08-27: 500 [IU]

## 2023-08-27 MED FILL — Fosaprepitant Dimeglumine For IV Infusion 150 MG (Base Eq): INTRAVENOUS | Qty: 5 | Status: AC

## 2023-08-27 NOTE — Procedures (Signed)
 PROCEDURE SUMMARY:  Successful image-guided right-sided therapeutic thoracentesis. Yielded .850 liters of hazy, red-colored fluid. Patient tolerated procedure well. EBL: Zero No immediate complications.  Post procedure CXR shows no pneumothorax.  Please see imaging section of Epic for full dictation.  Gordy Lauber Raunel Dimartino PA-C 08/27/2023 2:08 PM

## 2023-08-27 NOTE — Assessment & Plan Note (Addendum)
 I reviewed chest x-ray today She is symptomatic with shortness of breath Her chest x-ray is worst in comparison with her chest x-ray from 2 weeks ago I will order urgent thoracentesis today and arrange for Pleurx catheter placement in the future

## 2023-08-27 NOTE — Assessment & Plan Note (Addendum)
 She is not symptomatic from pancytopenia but based on her blood count, it is not safe to proceed with chemotherapy today I will defer her treatment to next week

## 2023-08-27 NOTE — Assessment & Plan Note (Addendum)
 She has mild intermittent chest wall discomfort likely related to her pleural effusion She will continue her prescription of oxycodone  to take as needed I refilled her prescription today

## 2023-08-27 NOTE — Progress Notes (Signed)
 [name] OFFICE PROGRESS NOTE  Patient Care Team: Imelda Man, MD as PCP - General (Internal Medicine) Amanda Jungling Tomas Fountain, MD as PCP - Cardiology (Cardiology) Ferne Hoyle Chriss Coup, RN as Oncology Nurse Navigator (Oncology)  Assessment & Plan Metastasis from cancer of uterus [facility]) The patient initially presented in April 2024 with postmenopausal bleeding.  CT imaging revealed stage IV disease with metastatic cancer to the pleural fluid as well as peritoneal metastasis Biopsy review high-grade serous carcinoma, MMR normal, Her2 positive, FISh 3+, MSI stable, High grade serous, PIK3CA pathogenic variant, p53 mutated  She received neoadjuvant chemotherapy with combination of carboplatin , paclitaxel  and trastuzumab  with excellent response to therapy, followed by interval debulking surgery and then adjuvant chemotherapy, completed by November 2024, followed by maintenance trastuzumab , then had disease recurrence February 2025 with malignant pleural effusion and abdominal lymphadenopathy Repeat fluid cytology confirmed HER2 positive disease  Unfortunately, she developed recurrent pleural effusion, severe pancytopenia and significant electrolyte imbalance I am concerned she may not be responding to treatment I would defer her chemotherapy to next week due to pancytopenia I will arrange for urgent thoracentesis as well as future placement of Pleurx catheter to manage recurrent pleural effusion I will order CT imaging to be done next week for objective assessment of response to therapy Recurrent pleural effusion I reviewed chest x-ray today She is symptomatic with shortness of breath Her chest x-ray is worst in comparison with her chest x-ray from 2 weeks ago I will order urgent thoracentesis today and arrange for Pleurx catheter placement in the future  Hypomagnesemia I will prescribe oral magnesium  replacement therapy Pancytopenia, acquired (HCC) She is not symptomatic from pancytopenia  but based on her blood count, it is not safe to proceed with chemotherapy today I will defer her treatment to next week Atrial fibrillation, unspecified type (HCC) She will continue chronic anticoagulation therapy She has no recent bleeding from Eliquis  Cancer associated pain She has mild intermittent chest wall discomfort likely related to her pleural effusion She will continue her prescription of oxycodone  to take as needed I refilled her prescription today  Orders Placed This Encounter  Procedures   US  THORACENTESIS ASP PLEURAL SPACE W/IMG GUIDE    Standing Status:   Future    Number of Occurrences:   1    Expected Date:   08/27/2023    Expiration Date:   08/26/2024    Are labs required for specimen collection?:   No    Reason for Exam (SYMPTOM  OR DIAGNOSIS REQUIRED):   recurrent pleural effusion    Preferred imaging location?:   [facility]    Call Results- Best Contact Number?:   [phone] do not hold   CT CHEST ABDOMEN PELVIS W CONTRAST    Standing Status:   Future    Expected Date:   09/03/2023    Expiration Date:   08/26/2024    If indicated for the ordered procedure, I authorize the administration of contrast media per Radiology protocol:   Yes    Does the patient have a contrast media/X-ray dye allergy?:   No    Preferred imaging location?:   [facility]    If indicated for the ordered procedure, I authorize the administration of oral contrast media per Radiology protocol:   No    Reason for no oral contrast::   no need oral contrast   IR PERC PLEURAL DRAIN W/INDWELL CATH W/IMG GUIDE    Standing Status:   Future    Expected  Date:   09/03/2023    Expiration Date:   08/26/2024    Reason for Exam (SYMPTOM  OR DIAGNOSIS REQUIRED):   recurrent pleural effusion for Pleurx catheter placement    Preferred Imaging Location?:   [facility]   CBC with Differential (Cancer Center Only)    Standing Status:   Future    Expected Date:   09/04/2023    Expiration  Date:   09/03/2024   CMP (Cancer Center only)    Standing Status:   Future    Expected Date:   09/04/2023    Expiration Date:   09/03/2024   T4    Standing Status:   Future    Expected Date:   09/04/2023    Expiration Date:   09/03/2024   TSH    Standing Status:   Future    Expected Date:   09/04/2023    Expiration Date:   09/03/2024   Magnesium     Standing Status:   Future    Expected Date:   09/03/2023    Expiration Date:   08/26/2024     Maria Jacobs, MD  INTERVAL HISTORY: she returns for treatment follow-up Complications related to previous cycle of chemotherapy included pancytopenia,, cancer associated pain,, and electrolyte abnormalities, recurrent pleural effusion She felt short of breath for the last few days The patient denies any recent signs or symptoms of bleeding such as spontaneous epistaxis, hematuria or hematochezia. Denies worsening peripheral neuropathy  PHYSICAL EXAMINATION: ECOG PERFORMANCE STATUS: 2 - Symptomatic, <50% confined to bed  Vitals:   08/27/23 0901  BP: 138/85  Pulse: 88  Resp: 18  Temp: 98 F (36.7 C)  SpO2: 98%   Filed Weights   08/27/23 0901  Weight: 172 lb 6.4 oz (78.2 kg)    Relevant data reviewed during this visit included CBC, CMP, magnesium  and chest x-ray

## 2023-08-27 NOTE — Assessment & Plan Note (Addendum)
 The patient initially presented in April 2024 with postmenopausal bleeding.  CT imaging revealed stage IV disease with metastatic cancer to the pleural fluid as well as peritoneal metastasis Biopsy review high-grade serous carcinoma, MMR normal, Her2 positive, FISh 3+, MSI stable, High grade serous, PIK3CA pathogenic variant, p53 mutated  She received neoadjuvant chemotherapy with combination of carboplatin , paclitaxel  and trastuzumab  with excellent response to therapy, followed by interval debulking surgery and then adjuvant chemotherapy, completed by November 2024, followed by maintenance trastuzumab , then had disease recurrence February 2025 with malignant pleural effusion and abdominal lymphadenopathy Repeat fluid cytology confirmed HER2 positive disease  Unfortunately, she developed recurrent pleural effusion, severe pancytopenia and significant electrolyte imbalance I am concerned she may not be responding to treatment I would defer her chemotherapy to next week due to pancytopenia I will arrange for urgent thoracentesis as well as future placement of Pleurx catheter to manage recurrent pleural effusion I will order CT imaging to be done next week for objective assessment of response to therapy

## 2023-08-27 NOTE — Telephone Encounter (Signed)
 Called and given message from Dr. Marton Sleeper, magnesium  low and magnesium  oxide sent to pharmacy for BID. Husband verbalized understanding.

## 2023-08-27 NOTE — Progress Notes (Signed)
 CRITICAL VALUE STICKER  CRITICAL VALUE: Magnesium  0.9  RECEIVER (on-site recipient of call): Prentiss Brocks, RN  DATE & TIME NOTIFIED: 08/27/23 at 0950  MESSENGER (representative from lab): Frederich Jaksch   MD NOTIFIED: Dr. Marton Sleeper  TIME OF NOTIFICATION:08/27/23 @ [phone]   RESPONSE: Will review labs

## 2023-08-27 NOTE — Assessment & Plan Note (Addendum)
 She will continue chronic anticoagulation therapy She has no recent bleeding from Eliquis 

## 2023-08-27 NOTE — Progress Notes (Signed)
 RE: PLUREX CATH Received: Today Suttle, Antonia Battiest, MD  Vito Grippe Approved.

## 2023-08-27 NOTE — Assessment & Plan Note (Addendum)
 I will prescribe oral magnesium  replacement therapy

## 2023-08-28 ENCOUNTER — Telehealth: Payer: Self-pay | Admitting: Hematology and Oncology

## 2023-08-28 ENCOUNTER — Other Ambulatory Visit: Payer: Self-pay

## 2023-08-28 ENCOUNTER — Ambulatory Visit: Admitting: Hematology and Oncology

## 2023-08-28 ENCOUNTER — Other Ambulatory Visit

## 2023-08-28 ENCOUNTER — Encounter: Payer: Self-pay | Admitting: Hematology and Oncology

## 2023-08-28 ENCOUNTER — Ambulatory Visit

## 2023-08-28 DIAGNOSIS — J90 Pleural effusion, not elsewhere classified: Secondary | ICD-10-CM

## 2023-08-28 DIAGNOSIS — C55 Malignant neoplasm of uterus, part unspecified: Secondary | ICD-10-CM

## 2023-08-28 LAB — T4: T4, Total: 10.4 ug/dL (ref 4.5–12.0)

## 2023-08-28 NOTE — Progress Notes (Signed)
 Faxed order request for Pleurx catheter supplies to [facility] supplies to [phone], received fax confirmation.

## 2023-08-28 NOTE — Telephone Encounter (Signed)
Called patient unable to leave vm

## 2023-08-30 ENCOUNTER — Other Ambulatory Visit: Payer: Self-pay

## 2023-08-31 ENCOUNTER — Telehealth: Payer: Self-pay

## 2023-08-31 NOTE — Telephone Encounter (Signed)
 Called husband and told him a referral for home health has been sent to [facility] and they will contact them later this week to set up appt. Elester Grim will be calling next to set up delivery of pleurx catheter kits. Husband verbalized understanding and will call the office back if needed.

## 2023-08-31 NOTE — Progress Notes (Signed)
 Received call from [facility] regarding pleurx catheter supplies. They received the order and will contact Adriana Hopping next week to set up delivery.

## 2023-09-02 ENCOUNTER — Ambulatory Visit (HOSPITAL_COMMUNITY)
Admission: RE | Admit: 2023-09-02 | Discharge: 2023-09-02 | Disposition: A | Discharge status: Home / Self Care | Source: Ambulatory Visit | Attending: Hematology and Oncology | Admitting: Hematology and Oncology

## 2023-09-02 DIAGNOSIS — K8020 Calculus of gallbladder without cholecystitis without obstruction: Secondary | ICD-10-CM | POA: Diagnosis not present

## 2023-09-02 DIAGNOSIS — J90 Pleural effusion, not elsewhere classified: Secondary | ICD-10-CM | POA: Insufficient documentation

## 2023-09-02 DIAGNOSIS — K5730 Diverticulosis of large intestine without perforation or abscess without bleeding: Secondary | ICD-10-CM | POA: Diagnosis not present

## 2023-09-02 DIAGNOSIS — C799 Secondary malignant neoplasm of unspecified site: Secondary | ICD-10-CM | POA: Diagnosis not present

## 2023-09-02 DIAGNOSIS — C55 Malignant neoplasm of uterus, part unspecified: Secondary | ICD-10-CM | POA: Diagnosis present

## 2023-09-02 MED ORDER — SODIUM CHLORIDE (PF) 0.9 % IJ SOLN
INTRAMUSCULAR | Status: AC
  Filled 2023-09-02: qty 50

## 2023-09-02 MED ORDER — IOHEXOL 300 MG/ML  SOLN
100.0000 mL | Freq: Once | INTRAMUSCULAR | Status: AC | PRN
Start: 2023-09-02 — End: 2023-09-02
  Administered 2023-09-02: 100 mL via INTRAVENOUS

## 2023-09-03 ENCOUNTER — Telehealth: Payer: Self-pay | Admitting: *Deleted

## 2023-09-03 ENCOUNTER — Inpatient Hospital Stay: Attending: Gynecologic Oncology | Admitting: Hematology and Oncology

## 2023-09-03 ENCOUNTER — Inpatient Hospital Stay

## 2023-09-03 ENCOUNTER — Other Ambulatory Visit: Payer: Self-pay | Admitting: Hematology and Oncology

## 2023-09-03 ENCOUNTER — Encounter: Payer: Self-pay | Admitting: Hematology and Oncology

## 2023-09-03 VITALS — BP 127/82 | HR 84 | Temp 99.0°F | Resp 18 | Ht 62.0 in | Wt 172.8 lb

## 2023-09-03 DIAGNOSIS — C799 Secondary malignant neoplasm of unspecified site: Secondary | ICD-10-CM

## 2023-09-03 DIAGNOSIS — D61818 Other pancytopenia: Secondary | ICD-10-CM | POA: Diagnosis not present

## 2023-09-03 DIAGNOSIS — J910 Malignant pleural effusion: Secondary | ICD-10-CM | POA: Diagnosis not present

## 2023-09-03 DIAGNOSIS — Z5111 Encounter for antineoplastic chemotherapy: Secondary | ICD-10-CM | POA: Insufficient documentation

## 2023-09-03 DIAGNOSIS — J90 Pleural effusion, not elsewhere classified: Secondary | ICD-10-CM

## 2023-09-03 DIAGNOSIS — I4891 Unspecified atrial fibrillation: Secondary | ICD-10-CM

## 2023-09-03 DIAGNOSIS — R300 Dysuria: Secondary | ICD-10-CM

## 2023-09-03 DIAGNOSIS — C786 Secondary malignant neoplasm of retroperitoneum and peritoneum: Secondary | ICD-10-CM | POA: Insufficient documentation

## 2023-09-03 DIAGNOSIS — D701 Agranulocytosis secondary to cancer chemotherapy: Secondary | ICD-10-CM | POA: Diagnosis not present

## 2023-09-03 DIAGNOSIS — Z79620 Long term (current) use of immunosuppressive biologic: Secondary | ICD-10-CM | POA: Insufficient documentation

## 2023-09-03 DIAGNOSIS — T451X5A Adverse effect of antineoplastic and immunosuppressive drugs, initial encounter: Secondary | ICD-10-CM | POA: Diagnosis not present

## 2023-09-03 DIAGNOSIS — C55 Malignant neoplasm of uterus, part unspecified: Secondary | ICD-10-CM | POA: Diagnosis not present

## 2023-09-03 DIAGNOSIS — Z5112 Encounter for antineoplastic immunotherapy: Secondary | ICD-10-CM | POA: Diagnosis not present

## 2023-09-03 LAB — CBC WITH DIFFERENTIAL (CANCER CENTER ONLY)
Abs Immature Granulocytes: 0.01 10*3/uL (ref 0.00–0.07)
Basophils Absolute: 0 10*3/uL (ref 0.0–0.1)
Basophils Relative: 0 %
Eosinophils Absolute: 0 10*3/uL (ref 0.0–0.5)
Eosinophils Relative: 0 %
HCT: 26.7 % — ABNORMAL LOW (ref 36.0–46.0)
Hemoglobin: 9.4 g/dL — ABNORMAL LOW (ref 12.0–15.0)
Immature Granulocytes: 0 %
Lymphocytes Relative: 45 %
Lymphs Abs: 1.2 10*3/uL (ref 0.7–4.0)
MCH: 33.7 pg (ref 26.0–34.0)
MCHC: 35.2 g/dL (ref 30.0–36.0)
MCV: 95.7 fL (ref 80.0–100.0)
Monocytes Absolute: 0.2 10*3/uL (ref 0.1–1.0)
Monocytes Relative: 7 %
Neutro Abs: 1.3 10*3/uL — ABNORMAL LOW (ref 1.7–7.7)
Neutrophils Relative %: 48 %
Platelet Count: 79 10*3/uL — ABNORMAL LOW (ref 150–400)
RBC: 2.79 MIL/uL — ABNORMAL LOW (ref 3.87–5.11)
RDW: 14.5 % (ref 11.5–15.5)
WBC Count: 2.7 10*3/uL — ABNORMAL LOW (ref 4.0–10.5)
nRBC: 0 % (ref 0.0–0.2)

## 2023-09-03 LAB — CMP (CANCER CENTER ONLY)
ALT: 7 U/L (ref 0–44)
AST: 12 U/L — ABNORMAL LOW (ref 15–41)
Albumin: 4 g/dL (ref 3.5–5.0)
Alkaline Phosphatase: 53 U/L (ref 38–126)
Anion gap: 7 (ref 5–15)
BUN: 12 mg/dL (ref 8–23)
CO2: 28 mmol/L (ref 22–32)
Calcium: 9.4 mg/dL (ref 8.9–10.3)
Chloride: 106 mmol/L (ref 98–111)
Creatinine: 0.61 mg/dL (ref 0.44–1.00)
GFR, Estimated: >60 mL/min (ref >60)
Glucose, Bld: 97 mg/dL (ref 70–99)
Potassium: 3.5 mmol/L (ref 3.5–5.1)
Sodium: 141 mmol/L (ref 135–145)
Total Bilirubin: 0.4 mg/dL (ref 0.0–1.2)
Total Protein: 7.2 g/dL (ref 6.5–8.1)

## 2023-09-03 LAB — MAGNESIUM: Magnesium: 1.5 mg/dL — ABNORMAL LOW (ref 1.7–2.4)

## 2023-09-03 LAB — TSH: TSH: 2.05 u[IU]/mL (ref 0.350–4.500)

## 2023-09-03 MED ORDER — SODIUM CHLORIDE 0.9% FLUSH
10.0000 mL | Freq: Once | INTRAVENOUS | Status: AC
  Administered 2023-09-03: 10 mL

## 2023-09-03 MED ORDER — HEPARIN SOD (PORK) LOCK FLUSH 100 UNIT/ML IV SOLN
500.0000 [IU] | Freq: Once | INTRAVENOUS | Status: AC
Start: 2023-09-03 — End: 2023-09-03
  Administered 2023-09-03: 500 [IU]

## 2023-09-03 MED FILL — Fosaprepitant Dimeglumine For IV Infusion 150 MG (Base Eq): INTRAVENOUS | Qty: 5 | Status: AC

## 2023-09-03 NOTE — Assessment & Plan Note (Addendum)
 She has recurrent pleural effusion but not symptomatic She felt that she can wait until Monday for her Pleurx catheter placement

## 2023-09-03 NOTE — Assessment & Plan Note (Addendum)
 The patient initially presented in April 2024 with postmenopausal bleeding.  CT imaging revealed stage IV disease with metastatic cancer to the pleural fluid as well as peritoneal metastasis Biopsy review high-grade serous carcinoma, MMR normal, Her2 positive, FISh 3+, MSI stable, High grade serous, PIK3CA pathogenic variant, p53 mutated  She received neoadjuvant chemotherapy with combination of carboplatin , paclitaxel  and trastuzumab  with excellent response to therapy, followed by interval debulking surgery and then adjuvant chemotherapy, completed by November 2024, followed by maintenance trastuzumab , then had disease recurrence February 2025 with malignant pleural effusion and abdominal lymphadenopathy Repeat fluid cytology confirmed HER2 positive disease  Unfortunately, she developed recurrent pleural effusion, severe pancytopenia and significant electrolyte imbalance I have reviewed multiple imaging studies with the patient which show positive response to therapy She will proceed with chemotherapy tomorrow as scheduled For recurrent pleural effusion, she will have catheter placement next week I will see her again next week for further follow-up

## 2023-09-03 NOTE — Assessment & Plan Note (Addendum)
 She has severe pancytopenia due to treatment but relatively asymptomatic We will proceed with treatment with dose reduction I plan to modify her treatment to lengthen interval between treatment to every 4 weeks

## 2023-09-03 NOTE — Telephone Encounter (Signed)
 Pt called and stated that she had pain and burning while urinating. Made provider aware of pt symptoms. Provider ordered UA for next infusion appt.

## 2023-09-03 NOTE — Progress Notes (Signed)
 [facility] OFFICE PROGRESS NOTE  Patient Care Team: Imelda Man, MD as PCP - General (Internal Medicine) Amanda Jungling Tomas Fountain, MD as PCP - Cardiology (Cardiology) Ferne Hoyle Chriss Coup, RN as Oncology Nurse Navigator (Oncology)  Assessment & Plan Metastasis from cancer of uterus [facility]) The patient initially presented in April 2024 with postmenopausal bleeding.  CT imaging revealed stage IV disease with metastatic cancer to the pleural fluid as well as peritoneal metastasis Biopsy review high-grade serous carcinoma, MMR normal, Her2 positive, FISh 3+, MSI stable, High grade serous, PIK3CA pathogenic variant, p53 mutated  She received neoadjuvant chemotherapy with combination of carboplatin , paclitaxel  and trastuzumab  with excellent response to therapy, followed by interval debulking surgery and then adjuvant chemotherapy, completed by November 2024, followed by maintenance trastuzumab , then had disease recurrence February 2025 with malignant pleural effusion and abdominal lymphadenopathy Repeat fluid cytology confirmed HER2 positive disease  Unfortunately, she developed recurrent pleural effusion, severe pancytopenia and significant electrolyte imbalance I have reviewed multiple imaging studies with the patient which show positive response to therapy She will proceed with chemotherapy tomorrow as scheduled For recurrent pleural effusion, she will have catheter placement next week I will see her again next week for further follow-up  Pancytopenia, acquired (HCC) She has severe pancytopenia due to treatment but relatively asymptomatic We will proceed with treatment with dose reduction I plan to modify her treatment to lengthen interval between treatment to every 4 weeks Recurrent pleural effusion She has recurrent pleural effusion but not symptomatic She felt that she can wait until Monday for her Pleurx catheter placement Atrial fibrillation, unspecified type [facility]) She will continue  chronic anticoagulation therapy She has no recent bleeding from Eliquis  Continue treatment as long as her platelet count is above 75,000  Orders Placed This Encounter  Procedures   CBC with Differential ([facility] Only)    Standing Status:   Future    Expected Date:   06/09/2024    Expiration Date:   06/09/2025   CMP ([facility] only)    Standing Status:   Future    Expected Date:   06/09/2024    Expiration Date:   06/09/2025   T4    Standing Status:   Future    Expected Date:   06/09/2024    Expiration Date:   06/09/2025   TSH    Standing Status:   Future    Expected Date:   06/09/2024    Expiration Date:   06/09/2025   CBC with Differential ([facility] Only)    Standing Status:   Future    Expected Date:   07/21/2024    Expiration Date:   07/21/2025   CMP ([facility] only)    Standing Status:   Future    Expected Date:   07/21/2024    Expiration Date:   07/21/2025   T4    Standing Status:   Future    Expected Date:   07/21/2024    Expiration Date:   07/21/2025   TSH    Standing Status:   Future    Expected Date:   07/21/2024    Expiration Date:   07/21/2025     Almeda Jacobs, MD  INTERVAL HISTORY: she returns for treatment follow-up Complications related to previous cycle of chemotherapy included pancytopenia,, weight loss,, and recurrent pleural effusion She denies worsening neuropathy Denies recent nausea or constipation No worsening shortness of breath The patient denies any recent signs or symptoms of bleeding such as spontaneous epistaxis, hematuria or hematochezia.  PHYSICAL EXAMINATION: ECOG PERFORMANCE STATUS: 2 - Symptomatic, <50% confined to bed  Vitals:   09/03/23 0818  BP: 127/82  Pulse: 84  Resp: 18  Temp: 99 F (37.2 C)  SpO2: 98%   Filed Weights   09/03/23 0818  Weight: 172 lb 12.8 oz (78.4 kg)    Relevant data reviewed during this visit included CBC, CMP and CT imaging from April 2025

## 2023-09-03 NOTE — Assessment & Plan Note (Addendum)
 She will continue chronic anticoagulation therapy She has no recent bleeding from Eliquis  Continue treatment as long as her platelet count is above 75,000

## 2023-09-04 ENCOUNTER — Other Ambulatory Visit: Payer: Self-pay | Admitting: Radiology

## 2023-09-04 ENCOUNTER — Inpatient Hospital Stay

## 2023-09-04 ENCOUNTER — Other Ambulatory Visit: Payer: Self-pay | Admitting: Student

## 2023-09-04 VITALS — BP 134/85 | HR 54 | Temp 98.6°F | Resp 20

## 2023-09-04 DIAGNOSIS — Z5111 Encounter for antineoplastic chemotherapy: Secondary | ICD-10-CM | POA: Diagnosis not present

## 2023-09-04 DIAGNOSIS — C55 Malignant neoplasm of uterus, part unspecified: Secondary | ICD-10-CM

## 2023-09-04 DIAGNOSIS — R300 Dysuria: Secondary | ICD-10-CM

## 2023-09-04 DIAGNOSIS — Z79620 Long term (current) use of immunosuppressive biologic: Secondary | ICD-10-CM | POA: Diagnosis not present

## 2023-09-04 DIAGNOSIS — T451X5A Adverse effect of antineoplastic and immunosuppressive drugs, initial encounter: Secondary | ICD-10-CM | POA: Diagnosis not present

## 2023-09-04 DIAGNOSIS — J90 Pleural effusion, not elsewhere classified: Secondary | ICD-10-CM

## 2023-09-04 DIAGNOSIS — J910 Malignant pleural effusion: Secondary | ICD-10-CM | POA: Diagnosis not present

## 2023-09-04 DIAGNOSIS — Z5112 Encounter for antineoplastic immunotherapy: Secondary | ICD-10-CM | POA: Diagnosis not present

## 2023-09-04 DIAGNOSIS — D701 Agranulocytosis secondary to cancer chemotherapy: Secondary | ICD-10-CM | POA: Diagnosis not present

## 2023-09-04 DIAGNOSIS — C786 Secondary malignant neoplasm of retroperitoneum and peritoneum: Secondary | ICD-10-CM | POA: Diagnosis not present

## 2023-09-04 LAB — URINALYSIS, COMPLETE (UACMP) WITH MICROSCOPIC
Bilirubin Urine: NEGATIVE
Glucose, UA: NEGATIVE mg/dL
Hgb urine dipstick: NEGATIVE
Ketones, ur: 5 mg/dL — AB
Nitrite: NEGATIVE
Protein, ur: 100 mg/dL — AB
Specific Gravity, Urine: 1.017 (ref 1.005–1.030)
pH: 6 (ref 5.0–8.0)

## 2023-09-04 MED ORDER — SODIUM CHLORIDE 0.9 % IV SOLN
500.0000 mg | Freq: Once | INTRAVENOUS | Status: AC
  Administered 2023-09-04: 500 mg via INTRAVENOUS
  Filled 2023-09-04: qty 10

## 2023-09-04 MED ORDER — DEXAMETHASONE SODIUM PHOSPHATE 10 MG/ML IJ SOLN
10.0000 mg | Freq: Once | INTRAMUSCULAR | Status: AC
  Administered 2023-09-04: 10 mg via INTRAVENOUS
  Filled 2023-09-04: qty 1

## 2023-09-04 MED ORDER — SODIUM CHLORIDE 0.9 % IV SOLN: INTRAVENOUS | Status: DC

## 2023-09-04 MED ORDER — SODIUM CHLORIDE 0.9% FLUSH
10.0000 mL | INTRAVENOUS | Status: DC | PRN
  Administered 2023-09-04: 10 mL

## 2023-09-04 MED ORDER — PALONOSETRON HCL INJECTION 0.25 MG/5ML
0.2500 mg | Freq: Once | INTRAVENOUS | Status: AC
  Administered 2023-09-04: 0.25 mg via INTRAVENOUS
  Filled 2023-09-04: qty 5

## 2023-09-04 MED ORDER — SODIUM CHLORIDE 0.9 % IV SOLN
105.0000 mg/m2 | Freq: Once | INTRAVENOUS | Status: AC
  Administered 2023-09-04: 192 mg via INTRAVENOUS
  Filled 2023-09-04: qty 32

## 2023-09-04 MED ORDER — SODIUM CHLORIDE 0.9 % IV SOLN
304.5000 mg | Freq: Once | INTRAVENOUS | Status: AC
  Administered 2023-09-04: 300 mg via INTRAVENOUS
  Filled 2023-09-04: qty 30

## 2023-09-04 MED ORDER — SODIUM CHLORIDE 0.9 % IV SOLN
150.0000 mg | Freq: Once | INTRAVENOUS | Status: AC
  Administered 2023-09-04: 150 mg via INTRAVENOUS
  Filled 2023-09-04: qty 5
  Filled 2023-09-04: qty 150

## 2023-09-04 MED ORDER — FAMOTIDINE IN NACL 20-0.9 MG/50ML-% IV SOLN
20.0000 mg | Freq: Once | INTRAVENOUS | Status: AC
  Administered 2023-09-04: 20 mg via INTRAVENOUS
  Filled 2023-09-04: qty 50

## 2023-09-04 MED ORDER — HEPARIN SOD (PORK) LOCK FLUSH 100 UNIT/ML IV SOLN
500.0000 [IU] | Freq: Once | INTRAVENOUS | Status: AC | PRN
Start: 2023-09-04 — End: 2023-09-04
  Administered 2023-09-04: 500 [IU]

## 2023-09-04 MED ORDER — CETIRIZINE HCL 10 MG/ML IV SOLN
10.0000 mg | Freq: Once | INTRAVENOUS | Status: AC
Start: 2023-09-04 — End: 2023-09-04
  Administered 2023-09-04: 10 mg via INTRAVENOUS
  Filled 2023-09-04: qty 1

## 2023-09-04 NOTE — H&P (Signed)
 Chief Complaint: Stage IV metastatic uterine cancer, recurrent symptomatic malignant right pleural effusion; referred for right Pleurx catheter placement  Referring Provider(s): Gorsuch,N  Supervising Physician: Elene Griffes  Patient Status: [facility] - Out-pt  History of Present Illness: [facility] is a 73 y.o. female ex-smoker with past medical history significant for hyperlipidemia, hypertension, prior stroke 2019 and stage IV metastatic uterine cancer initially diagnosed in April 2024.  Patient also has recurrent symptomatic malignant right pleural effusion and has undergone multiple thoracenteses, latest on 08/27/2023.  IR team also placed Port-A-Cath in patient on 10/21/2022.  She presents today for right Pleurx catheter placement.  *** Patient is Full Code  Past Medical History:  Diagnosis Date   Colitis    Endometrial cancer (HCC)    History of radiation therapy    Vagina-04/20/23-05/11/23- Dr. Retta Caster   Hyperlipidemia    Hypertension    Malnutrition (HCC)    Stroke [facility]) 2019   balance still affected    Past Surgical History:  Procedure Laterality Date   BIOPSY  02/26/2018   Procedure: BIOPSY;  Surgeon: Baldo Bonds, MD;  Location: [facility] ENDOSCOPY;  Service: Endoscopy;;   COLONOSCOPY WITH PROPOFOL  N/A 02/26/2018   Procedure: COLONOSCOPY WITH PROPOFOL ;  Surgeon: Baldo Bonds, MD;  Location: [facility] ENDOSCOPY;  Service: Endoscopy;  Laterality: N/A;   ESOPHAGOGASTRODUODENOSCOPY (EGD) WITH PROPOFOL  N/A 02/26/2018   Procedure: ESOPHAGOGASTRODUODENOSCOPY (EGD) WITH PROPOFOL ;  Surgeon: Baldo Bonds, MD;  Location: [facility] ENDOSCOPY;  Service: Endoscopy;  Laterality: N/A;   IR IMAGING GUIDED PORT INSERTION  10/21/2022   NO PAST SURGERIES     UNCERTAIN OF NAME OF SURGERY    Allergies: Crestor [rosuvastatin calcium ] and Simvastatin  Medications: Prior to Admission medications   Medication Sig Start Date End Date Taking? Authorizing Provider  apixaban  (ELIQUIS ) 5 MG  TABS tablet Take 1 tablet (5 mg total) by mouth 2 (two) times daily. 07/10/23   Almeda Jacobs, MD  benazepril -hydrochlorthiazide (LOTENSIN  HCT) 20-25 MG tablet Take 1 tablet by mouth daily. 11/22/19   [provider]  [employer] VITAMIN C  500 MG tablet TAKE 1 TABLET BY MOUTH TWICE A DAY 05/26/18   Jodi Munroe, NP  dexamethasone  (DECADRON ) 4 MG tablet Take 2 tabs at the night before and 2 tab the morning of chemotherapy, every 3 weeks, by mouth x 6 cycles 07/10/23   Almeda Jacobs, MD  lidocaine -prilocaine  (EMLA ) cream Apply 1 Application topically daily as needed (prior to port access). 06/19/23   Almeda Jacobs, MD  magnesium  oxide (MAG-OX) 400 (240 Mg) MG tablet Take 1 tablet (400 mg total) by mouth 2 (two) times daily. 08/27/23   Almeda Jacobs, MD  Multiple Vitamin (MULTIVITAMIN) capsule Take 1 capsule by mouth daily with breakfast. 07/21/17   [provider]  ondansetron  (ZOFRAN ) 8 MG tablet Take 1 tablet (8 mg total) by mouth every 8 (eight) hours as needed for nausea. 10/23/22   Almeda Jacobs, MD  oxyCODONE  (OXY IR/ROXICODONE ) 5 MG immediate release tablet Take 1 tablet (5 mg total) by mouth every 6 (six) hours as needed for severe pain (pain score 7-10). 08/27/23   Almeda Jacobs, MD  prochlorperazine  (COMPAZINE ) 10 MG tablet Take 1 tablet (10 mg total) by mouth every 6 (six) hours as needed for nausea or vomiting. 10/23/22   Almeda Jacobs, MD  sennosides-docusate sodium  (SENOKOT-S) 8.6-50 MG tablet Take 1 tablet by mouth 2 (two) times daily as needed for constipation.    [provider]  thiamine  (VITAMIN B-1) 100 MG tablet  TAKE 1 TABLET BY MOUTH EVERY DAY Patient taking differently: Take 100 mg by mouth daily. 05/25/18   Kirsteins, Cecilia Coe, MD  TYLENOL  8 HOUR ARTHRITIS PAIN 650 MG CR tablet Take 650-1,300 mg by mouth every 8 (eight) hours as needed for pain.    [provider]     Family History  Problem Relation Age of Onset   Alzheimer's disease Mother    Colon cancer Father     Endometrial cancer Sister        gyn, unsure type   Breast cancer Neg Hx    Ovarian cancer Neg Hx    Prostate cancer Neg Hx    Pancreatic cancer Neg Hx     Social History   Socioeconomic History   Marital status: Married    Spouse name: Not on file   Number of children: Not on file   Years of education: Not on file   Highest education level: Not on file  Occupational History   Not on file  Tobacco Use   Smoking status: Former    Current packs/day: 0.00    Types: Cigarettes    Quit date: 62    Years since quitting: 31.3   Smokeless tobacco: Never  Vaping Use   Vaping status: Never Used  Substance and Sexual Activity   Alcohol use: Yes    Comment: occ wine   Drug use: Not Currently   Sexual activity: Yes  Other Topics Concern   Not on file  Social History Narrative   Not on file   Social Drivers of Health   Financial Resource Strain: Not on file  Food Insecurity: No Food Insecurity (06/30/2023)   Hunger Vital Sign    Worried About Running Out of Food in the Last Year: Never true    Ran Out of Food in the Last Year: Never true  Transportation Needs: No Transportation Needs (06/30/2023)   PRAPARE - Administrator, Civil Service (Medical): No    Lack of Transportation (Non-Medical): No  Physical Activity: Not on file  Stress: Not on file  Social Connections: Socially Isolated (06/30/2023)   Social Connection and Isolation Panel [NHANES]    Frequency of Communication with Friends and Family: Once a week    Frequency of Social Gatherings with Friends and Family: Never    Attends Religious Services: Never    Database administrator or Organizations: No    Attends Engineer, structural: Never    Marital Status: Married       Review of Systems  Vital Signs:   Advance Care Plan: No documents on file   Physical Exam  Imaging: CT CHEST ABDOMEN PELVIS W CONTRAST Result Date: 09/03/2023 CLINICAL DATA:  Follow-up endometrial carcinoma. Status  post chemotherapy. * Tracking Code: BO * EXAM: CT CHEST, ABDOMEN, AND PELVIS WITH CONTRAST TECHNIQUE: Multidetector CT imaging of the chest, abdomen and pelvis was performed following the standard protocol during bolus administration of intravenous contrast. RADIATION DOSE REDUCTION: This exam was performed according to the departmental dose-optimization program which includes automated exposure control, adjustment of the mA and/or kV according to patient size and/or use of iterative reconstruction technique. CONTRAST:  OMNIPAQUE  IOHEXOL  300 MG/ML  SOLN COMPARISON:  06/29/2023 FINDINGS: CT CHEST FINDINGS Cardiovascular: No acute findings. Mediastinum/Lymph Nodes: No masses or pathologically enlarged lymph nodes identified. Lungs/Pleura: A large right pleural effusion is seen, slightly decreased in size since previous study. Right-sided pleural thickening and enhancement is again noted, consistent with malignant  pleural effusion. Marked compressive atelectasis of the right lung is again seen with greatest involvement of the right middle and lower lobes. No suspicious pulmonary nodules or masses are seen in the aerated portions of the lungs. Musculoskeletal:  No suspicious bone lesions identified. CT ABDOMEN AND PELVIS FINDINGS Hepatobiliary: No masses identified. Several tiny calcified gallstones are noted. No No evidence of cholecystitis or biliary ductal dilatation. Pancreas:  No mass or inflammatory changes. Spleen:  Within normal limits in size and appearance. Adrenals/Urinary tract: No suspicious masses or hydronephrosis. Stomach/Bowel: No evidence of obstruction, inflammatory process, or abnormal fluid collections. Diffuse colonic diverticulosis is again seen, without signs of diverticulitis. Vascular/Lymphatic: 7 mm right obturator lymph node is decreased in size from 10 mm previously. Other 9 mm right pelvic sidewall lymph node on image 88/2 is unchanged in size. Several sho sub-cm tty retroperitoneal  lymph nodes in the aortocaval and left paraaortic regions also show slight decrease in size since prior exam. No new or increased areas of lymphadenopathy are seen. No acute vascular findings. Reproductive: Prior hysterectomy again noted. Mild peritoneal thickening and tiny amount of free fluid in the dependent pelvis and cul-de-sac remain unchanged. Nodular soft tissue stranding again seen adjacent to the splenic flexure of the colon also appears stable. No new peritoneal thickening or nodularity identified. Other:  None. Musculoskeletal:  No suspicious bone lesions identified. IMPRESSION: Slight decrease in size of large right pleural effusion, with significant significant compressive atelectasis of right lung. Mild right pleural thickening and enhancement noted, consistent with malignant effusion. Slight decrease in size of sub-cm right pelvic sidewall and abdominal retroperitoneal lymph nodes. Stable mild peritoneal thickening and nodularity in left abdomen and pelvis with trace amount of free fluid. Peritoneal metastatic disease cannot be excluded. No new or progressive metastatic disease identified. Cholelithiasis. No radiographic evidence of cholecystitis. Colonic diverticulosis, without radiographic evidence of diverticulitis. Electronically Signed   By: Marlyce Sine M.D.   On: 09/03/2023 09:12   US  THORACENTESIS ASP PLEURAL SPACE W/IMG GUIDE Result Date: 08/27/2023 INDICATION: 73 year old female with a history of metastatic uterine cancer, with recurrent malignant right pleural effusion. IR was requested for therapeutic right thoracentesis. EXAM: ULTRASOUND GUIDED THERAPEUTIC THORACENTESIS MEDICATIONS: 6 cc of 1% lidocaine  COMPLICATIONS: None immediate. PROCEDURE: An ultrasound guided thoracentesis was thoroughly discussed with the patient and questions answered. The benefits, risks, alternatives and complications were also discussed. The patient understands and wishes to proceed with the procedure.  Written consent was obtained. Ultrasound was performed to localize and mark an adequate pocket of fluid in the right chest. The area was then prepped and draped in the normal sterile fashion. 1% Lidocaine  was used for local anesthesia. Under ultrasound guidance a 6 Fr Safe-T-Centesis catheter was introduced. Thoracentesis was performed. The catheter was removed and a dressing applied. FINDINGS: A total of approximately 850 mL of hazy, blood colored pleural fluid was removed. IMPRESSION: Successful ultrasound guided RIGHT thoracentesis yielding 850 mL of pleural fluid. Procedure performed by Lambert Pillion, PA-C Electronically Signed   By: Art Largo M.D.   On: 08/27/2023 14:16   DG Chest 1 View Result Date: 08/27/2023 CLINICAL DATA:  [id number] S/P thoracentesis [id number] EXAM: PORTABLE CHEST 1 VIEW COMPARISON:  Chest XR, earlier same day and 08/13/2023. CT chest, 06/29/2023 FINDINGS: Support lines; RIGHT chest port, catheter tip well positioned at the superior cavoatrial junction. Cardiac silhouette is within normal limits. Mild burden of aortic arch atherosclerosis. The LEFT lung is clear. Improved aeration of the RIGHT lung with post thoracentesis with  small volume of residual pleural effusion. No pneumothorax. Decreased, however residual RIGHT basilar consolidative opacity silhouetting the hemidiaphragm. No acute osseous abnormality. IMPRESSION: 1. Improved aeration of the RIGHT lung post thoracentesis with small volume residual pleural effusion. No pneumothorax. 2. Residual RIGHT basilar consolidative opacity, likely atelectasis. Electronically Signed   By: Art Largo M.D.   On: 08/27/2023 13:39   DG Chest 2 View Result Date: 08/27/2023 CLINICAL DATA:  recurrent pleural effusion EXAM: CHEST - 2 VIEW COMPARISON:  CHEST XR, 08/13/2023 AND 08/06/2023. CT CHEST, 06/29/2023 FINDINGS: Support lines: RIGHT chest port, catheter tip well positioned at the superior cavoatrial junction. RIGHT heart border is obscured,  cardiac silhouette is otherwise within normal limits. Mild burden of aortic arch atherosclerosis. The LEFT lung is clear. Increased opacification of the RIGHT chest with a moderate-to-large volume RIGHT pleural effusion. Associated RIGHT basilar consolidative opacity. No acute osseous abnormality. IMPRESSION: 1. Increased opacification of the RIGHT chest, with a moderate-to large volume RIGHT pleural effusion. 2. RIGHT basilar consolidative opacity is likely to represent atelectasis, however superimposed pneumonia can appear similar. Electronically Signed   By: Art Largo M.D.   On: 08/27/2023 11:02   DG Chest 2 View Result Date: 08/13/2023 CLINICAL DATA:  73 year old female with recurrent pleural effusion EXAM: CHEST - 2 VIEW COMPARISON:  08/06/2023 FINDINGS: Cardiomediastinal silhouette unchanged in size and contour. Recurrent opacity at the right lung base obscuring the right heart border and the right hemidiaphragm. Associated meniscus. Thickening of the fissure on the lateral view and the frontal view. No pneumothorax. Left lung relatively well aerated with some associated chronic scarring/atelectasis. Unchanged right IJ port catheter. Degenerative changes the spine.  No displaced fracture. IMPRESSION: Recurrent right-sided pleural effusion with associated atelectasis/consolidation Electronically Signed   By: Myrlene Asper D.O.   On: 08/13/2023 09:39   DG Chest 1 View Result Date: 08/06/2023 CLINICAL DATA:  [id number] S/P thoracentesis [id number] EXAM: PORTABLE CHEST 1 VIEW COMPARISON:  Chest XR, 08/06/2023 and 07/30/2023. CT chest, 06/29/2023. FINDINGS: Support lines: RIGHT chest portable catheter tip well positioned at the superior cavoatrial junction. Cardiac silhouette is within normal limits. Aortic arch atherosclerosis. The LEFT lung is clear and well inflated. Improved aeration of the RIGHT lung post thoracentesis without residual pleural effusion. Patchy linear RIGHT basilar opacities without overt  consolidation. No pneumothorax. No interval osseous abnormality. IMPRESSION: Improved aeration of the RIGHT lung post thoracentesis without significant residual pleural effusion. No pneumothorax. Electronically Signed   By: Art Largo M.D.   On: 08/06/2023 13:29   US  THORACENTESIS ASP PLEURAL SPACE W/IMG GUIDE Result Date: 08/06/2023 INDICATION: Patient with history of metastatic uterine cancer with recurrent malignant right pleural effusion. Request received for therapeutic right thoracentesis. EXAM: ULTRASOUND GUIDED THERAPEUTIC RIGHT THORACENTESIS MEDICATIONS: 8 mL 1% lidocaine  COMPLICATIONS: None immediate. PROCEDURE: An ultrasound guided thoracentesis was thoroughly discussed with the patient and questions answered. The benefits, risks, alternatives and complications were also discussed. The patient understands and wishes to proceed with the procedure. Written consent was obtained. Ultrasound was performed to localize and mark an adequate pocket of fluid in the right chest. The area was then prepped and draped in the normal sterile fashion. 1% Lidocaine  was used for local anesthesia. Under ultrasound guidance a 6 Fr Safe-T-Centesis catheter was introduced. Thoracentesis was performed. The catheter was removed and a dressing applied. FINDINGS: A total of approximately 950 mL of yellow fluid was removed. IMPRESSION: Successful ultrasound guided therapeutic RIGHT thoracentesis yielding 950 mL of pleural fluid. Performed by: Kevin Cordarrell Sane,PA-C Electronically Signed  By: Art Largo M.D.   On: 08/06/2023 13:27   DG Chest 2 View Result Date: 08/06/2023 CLINICAL DATA:  recurrent pleural effusion EXAM: CHEST - 2 VIEW COMPARISON:  Chest XR, 07/30/2023.  CT chest, 06/29/2023. FINDINGS: Support lines: RIGHT chest port, catheter tip well positioned at the superior cavoatrial junction. Obscured RIGHT heart border. The cardiac silhouette is otherwise within normal limits. Aortic arch of the sclerosis. The LEFT lung is  clear and well inflated. Moderate volume RIGHT pleural effusion with associated RIGHT basilar consolidation silhouetting the hemidiaphragm. No pneumothorax. No acute osseous abnormality. IMPRESSION: 1. Moderate volume RIGHT pleural effusion. 2. RIGHT basilar consolidation is likely to represent atelectasis, however superimposed pneumonia can appear similar. Electronically Signed   By: Art Largo M.D.   On: 08/06/2023 12:22    Labs:  CBC: Recent Labs    07/16/23 0934 08/06/23 0907 08/27/23 0839 09/03/23 0755  WBC 4.5 2.7* 2.3* 2.7*  HGB 10.3* 10.6* 9.5* 9.4*  HCT 30.2* 31.4* 27.3* 26.7*  PLT 210 95* 75* 79*    COAGS: Recent Labs    07/01/23 1231 07/02/23 0529 07/03/23 0410 07/07/23 1447  INR 1.0 1.1 1.1 2.3    BMP: Recent Labs    07/16/23 0934 08/06/23 0907 08/27/23 0839 09/03/23 0755  NA 140 140 138 141  K 3.6 3.6 3.1* 3.5  CL 107 105 104 106  CO2 28 27 29 28   GLUCOSE 89 98 84 97  BUN 14 9 12 12   CALCIUM  9.2 9.5 9.7 9.4  CREATININE 0.66 0.66 0.62 0.61  GFRNONAA >60 >60 >60 >60    LIVER FUNCTION TESTS: Recent Labs    07/16/23 0934 08/06/23 0907 08/27/23 0839 09/03/23 0755  BILITOT 0.3 0.3 0.4 0.4  AST 13* 14* 14* 12*  ALT 9 9 7 7   ALKPHOS 47 48 50 53  PROT 6.8 6.9 7.2 7.2  ALBUMIN  3.9 4.0 4.1 4.0    TUMOR MARKERS: No results for input(s): "AFPTM", "CEA", "CA199", "CHROMGRNA" in the last 8760 hours.  Assessment and Plan: 73 y.o. female ex-smoker with past medical history significant for hyperlipidemia, hypertension, prior stroke 2019 and stage IV metastatic uterine cancer initially diagnosed in April 2024.  Patient also has recurrent symptomatic malignant right pleural effusion and has undergone multiple thoracenteses, latest on 08/27/2023.  IR team also placed Port-A-Cath in patient on 10/21/2022.  She presents today for right Pleurx catheter placement.  Details/risks of procedure, including but not limited to internal bleeding, infection, injury to  adjacent structures/pneumothorax discussed with patient with her understanding and consent.   Thank you for allowing our service to participate in Maria [facility] 's care.  Electronically Signed: D. Honore Lux, PA-C   09/04/2023, 3:16 PM      I spent a total of    25 Minutes in face to face in clinical consultation, greater than 50% of which was counseling/coordinating care for right Pleurx catheter placement

## 2023-09-04 NOTE — Progress Notes (Signed)
 11:04 Pt returned from ambulating to the bathroom and reports shortness of breath. O2 sats 92-93% on RA. Dr Marton Sleeper notified. O2 2 L started, pt up to 96-100 w/ 2 liters. Per Dr. Marton Sleeper if patient sats remain WNL, Oxygen can be discontinued 12:30 Pt ambulated to bathroom. O2 sats remained >96% on RA. She is aware of her appt for thoracentesis on Monday.

## 2023-09-04 NOTE — Patient Instructions (Signed)
 [facility] - A DEPT OF [facility]  Discharge Instructions: Thank you for choosing [facility] to provide your oncology and hematology care.   If you have a lab appointment with the Cancer Center, please go directly to the Cancer Center and check in at the registration area.   Wear comfortable clothing and clothing appropriate for easy access to any Portacath or PICC line.   We strive to give you quality time with your provider. You may need to reschedule your appointment if you arrive late (15 or more minutes).  Arriving late affects you and other patients whose appointments are after yours.  Also, if you miss three or more appointments without notifying the office, you may be dismissed from the clinic at the provider's discretion.      For prescription refill requests, have your pharmacy contact our office and allow 72 hours for refills to be completed.    Today you received the following chemotherapy and/or immunotherapy agents: Jemperli, Taxol, Carboplatin      To help prevent nausea and vomiting after your treatment, we encourage you to take your nausea medication as directed.  BELOW ARE SYMPTOMS THAT SHOULD BE REPORTED IMMEDIATELY: *FEVER GREATER THAN 100.4 F (38 C) OR HIGHER *CHILLS OR SWEATING *NAUSEA AND VOMITING THAT IS NOT CONTROLLED WITH YOUR NAUSEA MEDICATION *UNUSUAL SHORTNESS OF BREATH *UNUSUAL BRUISING OR BLEEDING *URINARY PROBLEMS (pain or burning when urinating, or frequent urination) *BOWEL PROBLEMS (unusual diarrhea, constipation, pain near the anus) TENDERNESS IN MOUTH AND THROAT WITH OR WITHOUT PRESENCE OF ULCERS (sore throat, sores in mouth, or a toothache) UNUSUAL RASH, SWELLING OR PAIN  UNUSUAL VAGINAL DISCHARGE OR ITCHING   Items with * indicate a potential emergency and should be followed up as soon as possible or go to the Emergency Department if any problems should occur.  Please show the CHEMOTHERAPY ALERT  CARD or IMMUNOTHERAPY ALERT CARD at check-in to the Emergency Department and triage nurse.  Should you have questions after your visit or need to cancel or reschedule your appointment, please contact [facility] - A DEPT OF [facility]  Dept: [phone]  and follow the prompts.  Office hours are 8:00 a.m. to 4:30 p.m. Monday - Friday. Please note that voicemails left after 4:00 p.m. may not be returned until the following business day.  We are closed weekends and major holidays. You have access to a nurse at all times for urgent questions. Please call the main number to the clinic Dept: [phone] and follow the prompts.   For any non-urgent questions, you may also contact your provider using MyChart. We now offer e-Visits for anyone 36 and older to request care online for non-urgent symptoms. For details visit [email or web address].   Also download the MyChart app! Go to the app store, search "MyChart", open the app, select [place], and log in with your MyChart username and password.

## 2023-09-06 ENCOUNTER — Other Ambulatory Visit: Payer: Self-pay | Admitting: Hematology and Oncology

## 2023-09-06 LAB — URINE CULTURE: Culture: 70000 — AB

## 2023-09-06 MED ORDER — AMPICILLIN 500 MG PO CAPS: 500.0000 mg | ORAL_CAPSULE | Freq: Four times a day (QID) | ORAL | 0 refills | Status: DC

## 2023-09-07 ENCOUNTER — Telehealth: Payer: Self-pay

## 2023-09-07 ENCOUNTER — Other Ambulatory Visit: Payer: Self-pay

## 2023-09-07 ENCOUNTER — Ambulatory Visit (HOSPITAL_COMMUNITY)
Admission: RE | Admit: 2023-09-07 | Discharge: 2023-09-07 | Disposition: A | Discharge status: Home / Self Care | Source: Ambulatory Visit | Attending: Hematology and Oncology | Admitting: Hematology and Oncology

## 2023-09-07 ENCOUNTER — Encounter (HOSPITAL_COMMUNITY): Payer: Self-pay

## 2023-09-07 DIAGNOSIS — Z87891 Personal history of nicotine dependence: Secondary | ICD-10-CM | POA: Insufficient documentation

## 2023-09-07 DIAGNOSIS — E785 Hyperlipidemia, unspecified: Secondary | ICD-10-CM | POA: Insufficient documentation

## 2023-09-07 DIAGNOSIS — I10 Essential (primary) hypertension: Secondary | ICD-10-CM | POA: Diagnosis not present

## 2023-09-07 DIAGNOSIS — J90 Pleural effusion, not elsewhere classified: Secondary | ICD-10-CM

## 2023-09-07 DIAGNOSIS — J910 Malignant pleural effusion: Secondary | ICD-10-CM | POA: Insufficient documentation

## 2023-09-07 DIAGNOSIS — Z8673 Personal history of transient ischemic attack (TIA), and cerebral infarction without residual deficits: Secondary | ICD-10-CM | POA: Insufficient documentation

## 2023-09-07 DIAGNOSIS — C55 Malignant neoplasm of uterus, part unspecified: Secondary | ICD-10-CM | POA: Insufficient documentation

## 2023-09-07 HISTORY — PX: IR PERC PLEURAL DRAIN W/INDWELL CATH W/IMG GUIDE: IMG5383

## 2023-09-07 LAB — T4

## 2023-09-07 MED ORDER — MIDAZOLAM HCL 2 MG/2ML IJ SOLN
INTRAMUSCULAR | Status: AC
  Filled 2023-09-07: qty 2

## 2023-09-07 MED ORDER — LIDOCAINE HCL 1 % IJ SOLN
INTRAMUSCULAR | Status: AC
  Filled 2023-09-07: qty 20

## 2023-09-07 MED ORDER — LIDOCAINE HCL 1 % IJ SOLN
20.0000 mL | Freq: Once | INTRAMUSCULAR | Status: AC
  Administered 2023-09-07: 10 mL via INTRADERMAL

## 2023-09-07 MED ORDER — SODIUM CHLORIDE 0.9 % IV SOLN: INTRAVENOUS | Status: DC

## 2023-09-07 MED ORDER — FENTANYL CITRATE (PF) 100 MCG/2ML IJ SOLN
INTRAMUSCULAR | Status: AC
Start: 2023-09-07 — End: ?
  Filled 2023-09-07: qty 2

## 2023-09-07 MED ORDER — MIDAZOLAM HCL 2 MG/2ML IJ SOLN
INTRAMUSCULAR | Status: DC | PRN
  Administered 2023-09-07 (×2): 1 mg via INTRAVENOUS

## 2023-09-07 MED ORDER — SODIUM CHLORIDE 0.9% FLUSH: 10.0000 mL | INTRAVENOUS | Status: DC | PRN

## 2023-09-07 MED ORDER — CEFAZOLIN SODIUM-DEXTROSE 2-4 GM/100ML-% IV SOLN
2.0000 g | Freq: Once | INTRAVENOUS | Status: AC
Start: 2023-09-07 — End: 2023-09-07
  Administered 2023-09-07: 2 g via INTRAVENOUS

## 2023-09-07 MED ORDER — HEPARIN SOD (PORK) LOCK FLUSH 100 UNIT/ML IV SOLN
500.0000 [IU] | Freq: Once | INTRAVENOUS | Status: AC
  Administered 2023-09-07: 500 [IU] via INTRAVENOUS
  Filled 2023-09-07: qty 5

## 2023-09-07 MED ORDER — FENTANYL CITRATE (PF) 100 MCG/2ML IJ SOLN
INTRAMUSCULAR | Status: DC | PRN
  Administered 2023-09-07 (×2): 50 ug via INTRAVENOUS

## 2023-09-07 MED ORDER — CEFAZOLIN SODIUM-DEXTROSE 2-4 GM/100ML-% IV SOLN
INTRAVENOUS | Status: AC
  Filled 2023-09-07: qty 100

## 2023-09-07 NOTE — Telephone Encounter (Signed)
 Called and given below message. She verbalized understanding and will start antibiotic today.

## 2023-09-07 NOTE — Procedures (Signed)
 Interventional Radiology Procedure Note  Procedure: Right tunneled PleurX catheter insertion  Complications: None  Estimated Blood Loss: <5 mL  Findings: 15 Fr PleurX catheter inserted into right pleural space via right mid axillary approach with removal after of 1.2 L of yellow fluid.  Nonda Bays. Nereida Banning, M.D Pager:  [phone]

## 2023-09-07 NOTE — Discharge Instructions (Addendum)
 For questions /concerns may call Interventional Radiology at [phone] or  Interventional Radiology clinic [phone]   You may remove your dressing and shower tomorrow afternoon                                         Moderate Conscious Sedation, Adult, Care After After the procedure, it is common to have: Sleepiness for a few hours. Impaired judgment for a few hours. Trouble with balance. Nausea or vomiting if you eat too soon. Follow these instructions at home: For the time period you were told by your health care provider:  Rest. Do not participate in activities where you could fall or become injured. Do not drive or use machinery. Do not drink alcohol. Do not take sleeping pills or medicines that cause drowsiness. Do not make important decisions or sign legal documents. Do not take care of children on your own. Eating and drinking Follow instructions from your health care provider about what you may eat and drink. Drink enough fluid to keep your urine pale yellow. If you vomit: Drink clear fluids slowly and in small amounts as you are able. Clear fluids include water , ice chips, low-calorie sports drinks, and fruit juice that has water  added to it (diluted fruit juice). Eat light and bland foods in small amounts as you are able. These foods include bananas, applesauce, rice, lean meats, toast, and crackers. General instructions Take over-the-counter and prescription medicines only as told by your health care provider. Have a responsible adult stay with you for the time you are told. Do not use any products that contain nicotine or tobacco. These products include cigarettes, chewing tobacco, and vaping devices, such as e-cigarettes. If you need help quitting, ask your health care provider. Return to your normal activities as told by your health care provider. Ask your health care provider what activities are safe for you. Your health care provider may give you more  instructions. Make sure you know what you can and cannot do. Contact a health care provider if: You are still sleepy or having trouble with balance after 24 hours. You feel light-headed. You vomit every time you eat or drink. You get a rash. You have a fever. You have redness or swelling around the IV site. Get help right away if: You have trouble breathing. You start to feel confused at home. These symptoms may be an emergency. Get help right away. Call 911. Do not wait to see if the symptoms will go away. Do not drive yourself to the hospital. This information is not intended to replace advice given to you by your health care provider. Make sure you discuss any questions you have with your health care provider. Document Revised: 11/04/2021 Document Reviewed: 11/04/2021 Elsevier Patient Education  2024 [employer]  .Pleurex Catheter     Indwelling Pleural Catheter Home Guide (Pleurex Drain)  An indwelling pleural catheter is a thin, flexible tube that is inserted under your skin and into your chest. The catheter drains excess fluid that collects in the area between the chest wall and the lungs (pleural space). After the catheter is inserted, it can be attached to a bottle that collects fluid. The pleural catheter will allow you to drain fluid from your chest at home on a regular basis (sometimes daily). This will eliminate the need for frequent visits to the hospital or clinic to drain the fluid. The catheter  may be removed after the excess fluid problem is resolved, usually after 2-3 months. It is important to follow instructions from your health care provider about how to drain and care for your catheter. What are the risks? Generally, this is a safe procedure. However, problems may occur, including: Infection. Skin damage around the catheter. Lung damage. Failure of the chest tube to work properly. Spreading of cancer cells along the catheter, if you have cancer. Supplies  needed: Vacuum-sealed drainage bottle with attached drainage line. Sterile dressing. Sterile alcohol pads. Sterile gloves. Valve cap. Sterile gauze pads, 4  4 inch (10 cm  10 cm). Tape. Adhesive dressing. Sterile foam catheter pad. How to care for your catheter and insertion site Wash your hands with soap and warm water  before and after touching the catheter or insertion site. If soap and water  are not available, use hand sanitizer. Check your bandage (dressing) daily to make sure it is clean and dry. Keep the skin around the catheter clean and dry. Check the catheter regularly for any cracks or kinks in the tubing. Check your catheter insertion site every day for signs of infection. Check for: Skin breakdown. Redness, swelling, or pain. Fluid or blood. Warmth. Pus or a bad smell. How to drain your catheter You may need to drain your catheter every day, or more or less often as told by your health care provider. Follow instructions from your health care provider about how to drain your catheter. You may also refer to instructions that come with the drainage system. To drain the catheter: Wash your hands with soap and warm water . If soap and water  are not available, use hand sanitizer. Carefully remove the dressing from around the catheter. Wash your hands again. Put on the gloves provided. Prepare the vacuum-sealed drainage bottle and drainage line. Close the drainage line of the vacuum-sealed drainage bottle by squeezing the pinch clamp or rolling the wheel of the roller clamp toward the bottle. The vacuum in the bottle will be lost if the line is not closed completely. Remove the access tip cover from the drainage line. Do not touch the end. Set it on a sterile surface. Remove the catheter valve cap and throw it away. Use an alcohol pad to clean the end of the catheter. Insert the access tip into the catheter valve. Make sure the valve and access tip are securely connected. Listen  for a click to confirm that they are connected. Insert the T plunger to break the vacuum seal on the drainage bottle. Open the clamp on the drainage line. Allow the catheter to drain. Keep the catheter and the drainage bottle below the level of your chest. There may be a one-way valve on the end of the tubing that will allow liquid and air to flow out of the catheter without letting air inside. Drain the amount of fluid as told by your health care provider. It usually takes 5-15 minutes. Do not drain more than 1000 mL of fluid. You may feel a little discomfort while you are draining. If the pain is severe, stop draining and contact your health care provider. After you finish draining the catheter, remove the drainage bottle tubing from the catheter. Use a clean alcohol pad to wipe the catheter tip. Place a clean cap on the end of the catheter. Use an alcohol pad to clean the skin around the catheter. Allow the skin to air-dry. Put the catheter pad on your skin. Curl the catheter into loops and place it on  the pad. Do not place the catheter on your skin. Replace the dressing over the catheter. Discard the drainage bottle as instructed by your health care provider. Do not reuse the drainage bottle. How to change your dressing Change your dressing at least once a week, or more often if needed to keep the dressing dry. Be sure to change the dressing whenever it becomes moist. Your health care provider will tell you how often to change your dressing. Wash your hands with soap and warm water . If soap and water  are not available, use hand sanitizer. Gently remove the old dressing. Avoid using scissors to remove the dressing. Sharp objects may damage the catheter. Wash the skin around the insertion site with mild, fragrance-free soap and warm water . Rinse well, then pat the area dry with a clean cloth. Check the skin around the catheter for signs of infection. Check for: Skin breakdown. Redness,  swelling, or pain. Fluid or blood. Warmth. Pus or a bad smell. If your catheter was stitched (sutured) to your skin, look at the suture to make sure it is still anchored in your skin. Do not apply creams, ointments, or alcohol to the area. Let your skin air-dry completely before you apply a new dressing. Curl the catheter into loops and place it on the sterile catheter pad. Do not place the catheter on your skin. If you do not have a pad, use a clean dressing. Slide the dressing under the disk that holds the drainage catheter in place. Use gauze to cover the catheter and the catheter pad. The catheter should rest on the pad or dressing, not on your skin. Tape the dressing to your skin. You may be instructed to use an adhesive dressing covering instead of gauze and tape. Wash your hands with soap and warm water . If soap and water  are not available, use hand sanitizer. General recommendations Always wash your hands with soap and warm water  before and after caring for your catheter and drainage bottle. Use a mild, fragrance-free soap. If soap and water  are not available, use hand sanitizer. Always make sure there are no leaks in the catheter or drainage bottle. Each time you drain the catheter, note the color and amount of fluid. Do not touch the tip of the catheter or the drainage bottle tubing. Do not reuse drainage bottles. Do not take baths, swim, or use a hot tub until your health care provider approves. Ask your health care provider if you may take showers. You may only be allowed to take sponge baths. Take deep breaths regularly, followed by a cough. Doing this can help to prevent lung infection. Contact a health care provider if: You have any questions about caring for your catheter or drainage bottle. You still have pain at the catheter insertion site more than 2 days after your procedure. You have pain while draining your catheter. Your catheter becomes bent, twisted, or cracked. The  connection between the catheter and the collection bottle becomes loose. You have any of these around your catheter insertion site or coming from it: Skin breakdown. Redness, swelling, or pain. Fluid or blood. Warmth. Pus or a bad smell. Get help right away if: You have a fever or chills. You have chest pain. You have dizziness or shortness of breath. You have severe redness, swelling, or pain at your catheter insertion site. The catheter comes out. The catheter is blocked or clogged. Summary An indwelling pleural catheter is a thin, flexible tube that is inserted under your skin and  into your chest. The catheter drains excess fluid that collects in the area between the chest wall and the lungs (pleural space). It is important to follow instructions from your health care provider about how to drain and care for your catheter. Do not touch the tip of the catheter or the drainage bottle tubing. Always wash your hands with soap and water  before and after caring for your catheter and drainage bottle. If soap and water  are not available, use hand sanitizer. This information is not intended to replace advice given to you by your health care provider. Make sure you discuss any questions you have with your health care provider. Document Revised: 08/13/2018 Document Reviewed: 08/14/2016 Elsevier Patient Education  2020 [employer].

## 2023-09-07 NOTE — Telephone Encounter (Signed)
-----   Message from Almeda Jacobs sent at 09/06/2023 10:36 AM EDT ----- Urine culture is positive I sent antibiotics to her pharmacy

## 2023-09-08 ENCOUNTER — Telehealth: Payer: Self-pay

## 2023-09-08 DIAGNOSIS — I69391 Dysphagia following cerebral infarction: Secondary | ICD-10-CM | POA: Diagnosis not present

## 2023-09-08 DIAGNOSIS — Z86718 Personal history of other venous thrombosis and embolism: Secondary | ICD-10-CM | POA: Diagnosis not present

## 2023-09-08 DIAGNOSIS — G893 Neoplasm related pain (acute) (chronic): Secondary | ICD-10-CM | POA: Diagnosis not present

## 2023-09-08 DIAGNOSIS — Z7901 Long term (current) use of anticoagulants: Secondary | ICD-10-CM | POA: Diagnosis not present

## 2023-09-08 DIAGNOSIS — K223 Perforation of esophagus: Secondary | ICD-10-CM | POA: Diagnosis not present

## 2023-09-08 DIAGNOSIS — E1142 Type 2 diabetes mellitus with diabetic polyneuropathy: Secondary | ICD-10-CM | POA: Diagnosis not present

## 2023-09-08 DIAGNOSIS — G620 Drug-induced polyneuropathy: Secondary | ICD-10-CM | POA: Diagnosis not present

## 2023-09-08 DIAGNOSIS — C799 Secondary malignant neoplasm of unspecified site: Secondary | ICD-10-CM | POA: Diagnosis not present

## 2023-09-08 DIAGNOSIS — D72829 Elevated white blood cell count, unspecified: Secondary | ICD-10-CM | POA: Diagnosis not present

## 2023-09-08 DIAGNOSIS — Z48813 Encounter for surgical aftercare following surgery on the respiratory system: Secondary | ICD-10-CM | POA: Diagnosis not present

## 2023-09-08 DIAGNOSIS — T451X5D Adverse effect of antineoplastic and immunosuppressive drugs, subsequent encounter: Secondary | ICD-10-CM | POA: Diagnosis not present

## 2023-09-08 DIAGNOSIS — I10 Essential (primary) hypertension: Secondary | ICD-10-CM | POA: Diagnosis not present

## 2023-09-08 DIAGNOSIS — Z4803 Encounter for change or removal of drains: Secondary | ICD-10-CM | POA: Diagnosis not present

## 2023-09-08 DIAGNOSIS — D630 Anemia in neoplastic disease: Secondary | ICD-10-CM | POA: Diagnosis not present

## 2023-09-08 DIAGNOSIS — J910 Malignant pleural effusion: Secondary | ICD-10-CM | POA: Diagnosis not present

## 2023-09-08 DIAGNOSIS — N390 Urinary tract infection, site not specified: Secondary | ICD-10-CM | POA: Diagnosis not present

## 2023-09-08 DIAGNOSIS — D6481 Anemia due to antineoplastic chemotherapy: Secondary | ICD-10-CM | POA: Diagnosis not present

## 2023-09-08 DIAGNOSIS — E440 Moderate protein-calorie malnutrition: Secondary | ICD-10-CM | POA: Diagnosis not present

## 2023-09-08 DIAGNOSIS — Z4801 Encounter for change or removal of surgical wound dressing: Secondary | ICD-10-CM | POA: Diagnosis not present

## 2023-09-08 DIAGNOSIS — I4891 Unspecified atrial fibrillation: Secondary | ICD-10-CM | POA: Diagnosis not present

## 2023-09-08 DIAGNOSIS — D61818 Other pancytopenia: Secondary | ICD-10-CM | POA: Diagnosis not present

## 2023-09-08 NOTE — Telephone Encounter (Signed)
 Returned call to home health nurse Amy with [facility]. Maria Avila is being admitted to home health today. Given verbal order for weekly home health nurse w/ pluerx catheter drain weekly with prn visits. Amy will call with pleurx drain amount.

## 2023-09-09 ENCOUNTER — Other Ambulatory Visit (HOSPITAL_COMMUNITY): Payer: Self-pay

## 2023-09-10 ENCOUNTER — Encounter: Payer: Self-pay | Admitting: Hematology and Oncology

## 2023-09-10 ENCOUNTER — Inpatient Hospital Stay: Admitting: Hematology and Oncology

## 2023-09-10 VITALS — BP 118/68 | HR 73 | Temp 99.7°F | Resp 18 | Ht 62.0 in | Wt 172.6 lb

## 2023-09-10 DIAGNOSIS — G893 Neoplasm related pain (acute) (chronic): Secondary | ICD-10-CM

## 2023-09-10 DIAGNOSIS — D701 Agranulocytosis secondary to cancer chemotherapy: Secondary | ICD-10-CM | POA: Diagnosis not present

## 2023-09-10 DIAGNOSIS — C55 Malignant neoplasm of uterus, part unspecified: Secondary | ICD-10-CM

## 2023-09-10 DIAGNOSIS — Z79620 Long term (current) use of immunosuppressive biologic: Secondary | ICD-10-CM | POA: Diagnosis not present

## 2023-09-10 DIAGNOSIS — T451X5A Adverse effect of antineoplastic and immunosuppressive drugs, initial encounter: Secondary | ICD-10-CM | POA: Diagnosis not present

## 2023-09-10 DIAGNOSIS — C799 Secondary malignant neoplasm of unspecified site: Secondary | ICD-10-CM | POA: Diagnosis not present

## 2023-09-10 DIAGNOSIS — J90 Pleural effusion, not elsewhere classified: Secondary | ICD-10-CM | POA: Diagnosis not present

## 2023-09-10 DIAGNOSIS — C786 Secondary malignant neoplasm of retroperitoneum and peritoneum: Secondary | ICD-10-CM | POA: Diagnosis not present

## 2023-09-10 DIAGNOSIS — J910 Malignant pleural effusion: Secondary | ICD-10-CM | POA: Diagnosis not present

## 2023-09-10 DIAGNOSIS — Z5111 Encounter for antineoplastic chemotherapy: Secondary | ICD-10-CM | POA: Diagnosis not present

## 2023-09-10 DIAGNOSIS — Z5112 Encounter for antineoplastic immunotherapy: Secondary | ICD-10-CM | POA: Diagnosis not present

## 2023-09-10 NOTE — Progress Notes (Signed)
 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  CT imaging revealed stage IV disease with metastatic cancer to the pleural fluid as well as peritoneal metastasis Biopsy review high-grade serous carcinoma, MMR normal, Her2 positive, FISh 3+, MSI stable, High grade serous, PIK3CA pathogenic variant, p53 mutated  She received neoadjuvant chemotherapy with combination of carboplatin , paclitaxel  and trastuzumab  with excellent response to therapy, followed by interval debulking surgery and then adjuvant chemotherapy, completed by November 2024, followed by maintenance trastuzumab , then had disease recurrence February 2025 with malignant pleural effusion and abdominal lymphadenopathy Repeat fluid cytology confirmed HER2 positive disease  Unfortunately, she developed recurrent pleural effusion, severe pancytopenia and significant electrolyte imbalance CT imaging from April 2025 showed positive response to therapy She will continue treatment as scheduled Her next imaging study would be around July   Recurrent pleural effusion Her right lung aeration has improved with placement of Pleurx catheter She has home health arranged for next Monday to assist in draining the pleural fluid at home For now, the plan will be to drain it once a week If the amount of pleural fluid start to reduce over the next couple weeks, we will space out to every other week Cancer associated pain She has some mild pain after placement of Pleurx catheter but her pain is now stable She will continue to take oxycodone  as needed  No orders of the defined types were placed in this encounter.    Almeda Jacobs,  MD  INTERVAL HISTORY: she returns for surveillance follow-up after recent placement of Pleurx catheter She is doing well Her breathing has improved She has mild pain after the procedure but it is now well-controlled  PHYSICAL EXAMINATION: ECOG PERFORMANCE STATUS: 1 - Symptomatic but completely ambulatory  Vitals:   09/10/23 1129  BP: 118/68  Pulse: 73  Resp: 18  Temp: 99.7 F (37.6 C)  SpO2: 97%   Filed Weights   09/10/23 1129  Weight: 172 lb 9.6 oz (78.3 kg)  Chest examination of the right lung revealed minimum dullness at the lung base.  Normal breath sounds otherwise

## 2023-09-10 NOTE — Assessment & Plan Note (Addendum)
 She has some mild pain after placement of Pleurx catheter but her pain is now stable She will continue to take oxycodone  as needed

## 2023-09-10 NOTE — Assessment & Plan Note (Addendum)
 Her right lung aeration has improved with placement of Pleurx catheter She has home health arranged for next Monday to assist in draining the pleural fluid at home For now, the plan will be to drain it once a week If the amount of pleural fluid start to reduce over the next couple weeks, we will space out to every other week

## 2023-09-10 NOTE — Assessment & Plan Note (Addendum)
 The patient initially presented in April 2024 with postmenopausal bleeding.  CT imaging revealed stage IV disease with metastatic cancer to the pleural fluid as well as peritoneal metastasis Biopsy review high-grade serous carcinoma, MMR normal, Her2 positive, FISh 3+, MSI stable, High grade serous, PIK3CA pathogenic variant, p53 mutated  She received neoadjuvant chemotherapy with combination of carboplatin , paclitaxel  and trastuzumab  with excellent response to therapy, followed by interval debulking surgery and then adjuvant chemotherapy, completed by November 2024, followed by maintenance trastuzumab , then had disease recurrence February 2025 with malignant pleural effusion and abdominal lymphadenopathy Repeat fluid cytology confirmed HER2 positive disease  Unfortunately, she developed recurrent pleural effusion, severe pancytopenia and significant electrolyte imbalance CT imaging from April 2025 showed positive response to therapy She will continue treatment as scheduled Her next imaging study would be around July

## 2023-09-11 ENCOUNTER — Other Ambulatory Visit: Payer: Self-pay

## 2023-09-14 DIAGNOSIS — E1142 Type 2 diabetes mellitus with diabetic polyneuropathy: Secondary | ICD-10-CM | POA: Diagnosis not present

## 2023-09-14 DIAGNOSIS — Z48813 Encounter for surgical aftercare following surgery on the respiratory system: Secondary | ICD-10-CM | POA: Diagnosis not present

## 2023-09-14 DIAGNOSIS — Z86718 Personal history of other venous thrombosis and embolism: Secondary | ICD-10-CM | POA: Diagnosis not present

## 2023-09-14 DIAGNOSIS — Z4801 Encounter for change or removal of surgical wound dressing: Secondary | ICD-10-CM | POA: Diagnosis not present

## 2023-09-14 DIAGNOSIS — I4891 Unspecified atrial fibrillation: Secondary | ICD-10-CM | POA: Diagnosis not present

## 2023-09-14 DIAGNOSIS — G620 Drug-induced polyneuropathy: Secondary | ICD-10-CM | POA: Diagnosis not present

## 2023-09-14 DIAGNOSIS — N390 Urinary tract infection, site not specified: Secondary | ICD-10-CM | POA: Diagnosis not present

## 2023-09-14 DIAGNOSIS — C799 Secondary malignant neoplasm of unspecified site: Secondary | ICD-10-CM | POA: Diagnosis not present

## 2023-09-14 DIAGNOSIS — Z7901 Long term (current) use of anticoagulants: Secondary | ICD-10-CM | POA: Diagnosis not present

## 2023-09-14 DIAGNOSIS — G893 Neoplasm related pain (acute) (chronic): Secondary | ICD-10-CM | POA: Diagnosis not present

## 2023-09-14 DIAGNOSIS — E440 Moderate protein-calorie malnutrition: Secondary | ICD-10-CM | POA: Diagnosis not present

## 2023-09-14 DIAGNOSIS — T451X5D Adverse effect of antineoplastic and immunosuppressive drugs, subsequent encounter: Secondary | ICD-10-CM | POA: Diagnosis not present

## 2023-09-14 DIAGNOSIS — I69391 Dysphagia following cerebral infarction: Secondary | ICD-10-CM | POA: Diagnosis not present

## 2023-09-14 DIAGNOSIS — I10 Essential (primary) hypertension: Secondary | ICD-10-CM | POA: Diagnosis not present

## 2023-09-14 DIAGNOSIS — K223 Perforation of esophagus: Secondary | ICD-10-CM | POA: Diagnosis not present

## 2023-09-14 DIAGNOSIS — D61818 Other pancytopenia: Secondary | ICD-10-CM | POA: Diagnosis not present

## 2023-09-14 DIAGNOSIS — D6481 Anemia due to antineoplastic chemotherapy: Secondary | ICD-10-CM | POA: Diagnosis not present

## 2023-09-14 DIAGNOSIS — J910 Malignant pleural effusion: Secondary | ICD-10-CM | POA: Diagnosis not present

## 2023-09-14 DIAGNOSIS — D72829 Elevated white blood cell count, unspecified: Secondary | ICD-10-CM | POA: Diagnosis not present

## 2023-09-14 DIAGNOSIS — D630 Anemia in neoplastic disease: Secondary | ICD-10-CM | POA: Diagnosis not present

## 2023-09-14 DIAGNOSIS — Z4803 Encounter for change or removal of drains: Secondary | ICD-10-CM | POA: Diagnosis not present

## 2023-09-18 ENCOUNTER — Other Ambulatory Visit: Payer: Self-pay | Admitting: Hematology and Oncology

## 2023-09-21 DIAGNOSIS — D630 Anemia in neoplastic disease: Secondary | ICD-10-CM | POA: Diagnosis not present

## 2023-09-21 DIAGNOSIS — C799 Secondary malignant neoplasm of unspecified site: Secondary | ICD-10-CM | POA: Diagnosis not present

## 2023-09-21 DIAGNOSIS — E440 Moderate protein-calorie malnutrition: Secondary | ICD-10-CM | POA: Diagnosis not present

## 2023-09-21 DIAGNOSIS — N390 Urinary tract infection, site not specified: Secondary | ICD-10-CM | POA: Diagnosis not present

## 2023-09-21 DIAGNOSIS — D61818 Other pancytopenia: Secondary | ICD-10-CM | POA: Diagnosis not present

## 2023-09-21 DIAGNOSIS — Z4801 Encounter for change or removal of surgical wound dressing: Secondary | ICD-10-CM | POA: Diagnosis not present

## 2023-09-21 DIAGNOSIS — I69391 Dysphagia following cerebral infarction: Secondary | ICD-10-CM | POA: Diagnosis not present

## 2023-09-21 DIAGNOSIS — D72829 Elevated white blood cell count, unspecified: Secondary | ICD-10-CM | POA: Diagnosis not present

## 2023-09-21 DIAGNOSIS — K223 Perforation of esophagus: Secondary | ICD-10-CM | POA: Diagnosis not present

## 2023-09-21 DIAGNOSIS — Z86718 Personal history of other venous thrombosis and embolism: Secondary | ICD-10-CM | POA: Diagnosis not present

## 2023-09-21 DIAGNOSIS — T451X5D Adverse effect of antineoplastic and immunosuppressive drugs, subsequent encounter: Secondary | ICD-10-CM | POA: Diagnosis not present

## 2023-09-21 DIAGNOSIS — Z4803 Encounter for change or removal of drains: Secondary | ICD-10-CM | POA: Diagnosis not present

## 2023-09-21 DIAGNOSIS — G893 Neoplasm related pain (acute) (chronic): Secondary | ICD-10-CM | POA: Diagnosis not present

## 2023-09-21 DIAGNOSIS — Z48813 Encounter for surgical aftercare following surgery on the respiratory system: Secondary | ICD-10-CM | POA: Diagnosis not present

## 2023-09-21 DIAGNOSIS — J910 Malignant pleural effusion: Secondary | ICD-10-CM | POA: Diagnosis not present

## 2023-09-21 DIAGNOSIS — I4891 Unspecified atrial fibrillation: Secondary | ICD-10-CM | POA: Diagnosis not present

## 2023-09-21 DIAGNOSIS — E1142 Type 2 diabetes mellitus with diabetic polyneuropathy: Secondary | ICD-10-CM | POA: Diagnosis not present

## 2023-09-21 DIAGNOSIS — D6481 Anemia due to antineoplastic chemotherapy: Secondary | ICD-10-CM | POA: Diagnosis not present

## 2023-09-21 DIAGNOSIS — Z7901 Long term (current) use of anticoagulants: Secondary | ICD-10-CM | POA: Diagnosis not present

## 2023-09-21 DIAGNOSIS — I10 Essential (primary) hypertension: Secondary | ICD-10-CM | POA: Diagnosis not present

## 2023-09-21 DIAGNOSIS — G620 Drug-induced polyneuropathy: Secondary | ICD-10-CM | POA: Diagnosis not present

## 2023-09-25 ENCOUNTER — Other Ambulatory Visit: Payer: Self-pay | Admitting: Hematology and Oncology

## 2023-09-28 DIAGNOSIS — I10 Essential (primary) hypertension: Secondary | ICD-10-CM | POA: Diagnosis not present

## 2023-09-28 DIAGNOSIS — N390 Urinary tract infection, site not specified: Secondary | ICD-10-CM | POA: Diagnosis not present

## 2023-09-28 DIAGNOSIS — G620 Drug-induced polyneuropathy: Secondary | ICD-10-CM | POA: Diagnosis not present

## 2023-09-28 DIAGNOSIS — Z86718 Personal history of other venous thrombosis and embolism: Secondary | ICD-10-CM | POA: Diagnosis not present

## 2023-09-28 DIAGNOSIS — D61818 Other pancytopenia: Secondary | ICD-10-CM | POA: Diagnosis not present

## 2023-09-28 DIAGNOSIS — Z7901 Long term (current) use of anticoagulants: Secondary | ICD-10-CM | POA: Diagnosis not present

## 2023-09-28 DIAGNOSIS — J910 Malignant pleural effusion: Secondary | ICD-10-CM | POA: Diagnosis not present

## 2023-09-28 DIAGNOSIS — D72829 Elevated white blood cell count, unspecified: Secondary | ICD-10-CM | POA: Diagnosis not present

## 2023-09-28 DIAGNOSIS — Z48813 Encounter for surgical aftercare following surgery on the respiratory system: Secondary | ICD-10-CM | POA: Diagnosis not present

## 2023-09-28 DIAGNOSIS — D6481 Anemia due to antineoplastic chemotherapy: Secondary | ICD-10-CM | POA: Diagnosis not present

## 2023-09-28 DIAGNOSIS — E1142 Type 2 diabetes mellitus with diabetic polyneuropathy: Secondary | ICD-10-CM | POA: Diagnosis not present

## 2023-09-28 DIAGNOSIS — I4891 Unspecified atrial fibrillation: Secondary | ICD-10-CM | POA: Diagnosis not present

## 2023-09-28 DIAGNOSIS — T451X5D Adverse effect of antineoplastic and immunosuppressive drugs, subsequent encounter: Secondary | ICD-10-CM | POA: Diagnosis not present

## 2023-09-28 DIAGNOSIS — Z4801 Encounter for change or removal of surgical wound dressing: Secondary | ICD-10-CM | POA: Diagnosis not present

## 2023-09-28 DIAGNOSIS — G893 Neoplasm related pain (acute) (chronic): Secondary | ICD-10-CM | POA: Diagnosis not present

## 2023-09-28 DIAGNOSIS — Z4803 Encounter for change or removal of drains: Secondary | ICD-10-CM | POA: Diagnosis not present

## 2023-09-28 DIAGNOSIS — D630 Anemia in neoplastic disease: Secondary | ICD-10-CM | POA: Diagnosis not present

## 2023-09-28 DIAGNOSIS — K223 Perforation of esophagus: Secondary | ICD-10-CM | POA: Diagnosis not present

## 2023-09-28 DIAGNOSIS — E440 Moderate protein-calorie malnutrition: Secondary | ICD-10-CM | POA: Diagnosis not present

## 2023-09-28 DIAGNOSIS — I69391 Dysphagia following cerebral infarction: Secondary | ICD-10-CM | POA: Diagnosis not present

## 2023-09-28 DIAGNOSIS — C799 Secondary malignant neoplasm of unspecified site: Secondary | ICD-10-CM | POA: Diagnosis not present

## 2023-10-01 MED FILL — Fosaprepitant Dimeglumine For IV Infusion 150 MG (Base Eq): INTRAVENOUS | Qty: 5 | Status: AC

## 2023-10-02 ENCOUNTER — Inpatient Hospital Stay: Admitting: Hematology and Oncology

## 2023-10-02 ENCOUNTER — Inpatient Hospital Stay

## 2023-10-02 ENCOUNTER — Other Ambulatory Visit (HOSPITAL_COMMUNITY): Payer: Self-pay

## 2023-10-02 ENCOUNTER — Encounter: Payer: Self-pay | Admitting: Hematology and Oncology

## 2023-10-02 ENCOUNTER — Ambulatory Visit (HOSPITAL_COMMUNITY)
Admission: RE | Admit: 2023-10-02 | Discharge: 2023-10-02 | Disposition: A | Discharge status: Home / Self Care | Source: Ambulatory Visit | Attending: Hematology and Oncology | Admitting: Hematology and Oncology

## 2023-10-02 VITALS — BP 132/75 | HR 76 | Temp 98.5°F | Resp 18 | Ht 62.0 in | Wt 167.0 lb

## 2023-10-02 DIAGNOSIS — C799 Secondary malignant neoplasm of unspecified site: Secondary | ICD-10-CM | POA: Diagnosis not present

## 2023-10-02 DIAGNOSIS — C55 Malignant neoplasm of uterus, part unspecified: Secondary | ICD-10-CM

## 2023-10-02 DIAGNOSIS — Z5112 Encounter for antineoplastic immunotherapy: Secondary | ICD-10-CM | POA: Diagnosis not present

## 2023-10-02 DIAGNOSIS — J90 Pleural effusion, not elsewhere classified: Secondary | ICD-10-CM

## 2023-10-02 DIAGNOSIS — D701 Agranulocytosis secondary to cancer chemotherapy: Secondary | ICD-10-CM | POA: Diagnosis not present

## 2023-10-02 DIAGNOSIS — C786 Secondary malignant neoplasm of retroperitoneum and peritoneum: Secondary | ICD-10-CM | POA: Diagnosis not present

## 2023-10-02 DIAGNOSIS — Z79620 Long term (current) use of immunosuppressive biologic: Secondary | ICD-10-CM | POA: Diagnosis not present

## 2023-10-02 DIAGNOSIS — Z5111 Encounter for antineoplastic chemotherapy: Secondary | ICD-10-CM | POA: Diagnosis not present

## 2023-10-02 DIAGNOSIS — J910 Malignant pleural effusion: Secondary | ICD-10-CM | POA: Diagnosis not present

## 2023-10-02 DIAGNOSIS — T451X5A Adverse effect of antineoplastic and immunosuppressive drugs, initial encounter: Secondary | ICD-10-CM | POA: Diagnosis not present

## 2023-10-02 DIAGNOSIS — D61818 Other pancytopenia: Secondary | ICD-10-CM | POA: Diagnosis not present

## 2023-10-02 LAB — CBC WITH DIFFERENTIAL (CANCER CENTER ONLY)
Abs Immature Granulocytes: 0.01 10*3/uL (ref 0.00–0.07)
Basophils Absolute: 0 10*3/uL (ref 0.0–0.1)
Basophils Relative: 0 %
Eosinophils Absolute: 0 10*3/uL (ref 0.0–0.5)
Eosinophils Relative: 0 %
HCT: 27.5 % — ABNORMAL LOW (ref 36.0–46.0)
Hemoglobin: 9.4 g/dL — ABNORMAL LOW (ref 12.0–15.0)
Immature Granulocytes: 0 %
Lymphocytes Relative: 22 %
Lymphs Abs: 0.8 10*3/uL (ref 0.7–4.0)
MCH: 33.3 pg (ref 26.0–34.0)
MCHC: 34.2 g/dL (ref 30.0–36.0)
MCV: 97.5 fL (ref 80.0–100.0)
Monocytes Absolute: 0.1 10*3/uL (ref 0.1–1.0)
Monocytes Relative: 1 %
Neutro Abs: 2.8 10*3/uL (ref 1.7–7.7)
Neutrophils Relative %: 77 %
Platelet Count: 138 10*3/uL — ABNORMAL LOW (ref 150–400)
RBC: 2.82 MIL/uL — ABNORMAL LOW (ref 3.87–5.11)
RDW: 16.8 % — ABNORMAL HIGH (ref 11.5–15.5)
WBC Count: 3.7 10*3/uL — ABNORMAL LOW (ref 4.0–10.5)
nRBC: 0 % (ref 0.0–0.2)

## 2023-10-02 LAB — CMP (CANCER CENTER ONLY)
ALT: 8 U/L (ref 0–44)
AST: 14 U/L — ABNORMAL LOW (ref 15–41)
Albumin: 4.5 g/dL (ref 3.5–5.0)
Alkaline Phosphatase: 55 U/L (ref 38–126)
Anion gap: 7 (ref 5–15)
BUN: 14 mg/dL (ref 8–23)
CO2: 27 mmol/L (ref 22–32)
Calcium: 10.4 mg/dL — ABNORMAL HIGH (ref 8.9–10.3)
Chloride: 104 mmol/L (ref 98–111)
Creatinine: 0.55 mg/dL (ref 0.44–1.00)
GFR, Estimated: >60 mL/min (ref >60)
Glucose, Bld: 126 mg/dL — ABNORMAL HIGH (ref 70–99)
Potassium: 3.6 mmol/L (ref 3.5–5.1)
Sodium: 138 mmol/L (ref 135–145)
Total Bilirubin: 0.5 mg/dL (ref 0.0–1.2)
Total Protein: 8 g/dL (ref 6.5–8.1)

## 2023-10-02 MED ORDER — PALONOSETRON HCL INJECTION 0.25 MG/5ML
0.2500 mg | Freq: Once | INTRAVENOUS | Status: AC
  Administered 2023-10-02: 0.25 mg via INTRAVENOUS
  Filled 2023-10-02: qty 5

## 2023-10-02 MED ORDER — SODIUM CHLORIDE 0.9% FLUSH
10.0000 mL | INTRAVENOUS | Status: DC | PRN
  Administered 2023-10-02: 10 mL

## 2023-10-02 MED ORDER — DEXAMETHASONE SODIUM PHOSPHATE 10 MG/ML IJ SOLN
10.0000 mg | Freq: Once | INTRAMUSCULAR | Status: AC
  Administered 2023-10-02: 10 mg via INTRAVENOUS
  Filled 2023-10-02: qty 1

## 2023-10-02 MED ORDER — SODIUM CHLORIDE 0.9 % IV SOLN
297.5000 mg | Freq: Once | INTRAVENOUS | Status: AC
  Administered 2023-10-02: 300 mg via INTRAVENOUS
  Filled 2023-10-02: qty 30

## 2023-10-02 MED ORDER — SODIUM CHLORIDE 0.9 % IV SOLN
500.0000 mg | Freq: Once | INTRAVENOUS | Status: AC
  Administered 2023-10-02: 500 mg via INTRAVENOUS
  Filled 2023-10-02: qty 10

## 2023-10-02 MED ORDER — HEPARIN SOD (PORK) LOCK FLUSH 100 UNIT/ML IV SOLN
500.0000 [IU] | Freq: Once | INTRAVENOUS | Status: AC | PRN
  Administered 2023-10-02: 500 [IU]

## 2023-10-02 MED ORDER — FAMOTIDINE IN NACL 20-0.9 MG/50ML-% IV SOLN
20.0000 mg | Freq: Once | INTRAVENOUS | Status: AC
  Administered 2023-10-02: 20 mg via INTRAVENOUS
  Filled 2023-10-02: qty 50

## 2023-10-02 MED ORDER — SODIUM CHLORIDE 0.9 % IV SOLN
105.0000 mg/m2 | Freq: Once | INTRAVENOUS | Status: AC
  Administered 2023-10-02: 192 mg via INTRAVENOUS
  Filled 2023-10-02: qty 32

## 2023-10-02 MED ORDER — SODIUM CHLORIDE 0.9% FLUSH
10.0000 mL | Freq: Once | INTRAVENOUS | Status: AC
  Administered 2023-10-02: 10 mL

## 2023-10-02 MED ORDER — SODIUM CHLORIDE 0.9 % IV SOLN
150.0000 mg | Freq: Once | INTRAVENOUS | Status: AC
  Administered 2023-10-02: 150 mg via INTRAVENOUS
  Filled 2023-10-02: qty 150

## 2023-10-02 MED ORDER — SODIUM CHLORIDE 0.9 % IV SOLN: INTRAVENOUS | Status: DC

## 2023-10-02 MED ORDER — CETIRIZINE HCL 10 MG/ML IV SOLN
10.0000 mg | Freq: Once | INTRAVENOUS | Status: AC
  Administered 2023-10-02: 10 mg via INTRAVENOUS
  Filled 2023-10-02: qty 1

## 2023-10-02 NOTE — Assessment & Plan Note (Addendum)
 The patient initially presented in April 2024 with postmenopausal bleeding.  CT imaging revealed stage IV disease with metastatic cancer to the pleural fluid as well as peritoneal metastasis Biopsy review high-grade serous carcinoma, MMR normal, Her2 positive, FISh 3+, MSI stable, High grade serous, PIK3CA pathogenic variant, p53 mutated  She received neoadjuvant chemotherapy with combination of carboplatin , paclitaxel  and trastuzumab  with excellent response to therapy, followed by interval debulking surgery and then adjuvant chemotherapy, completed by November 2024, followed by maintenance trastuzumab , then had disease recurrence February 2025 with malignant pleural effusion and abdominal lymphadenopathy Repeat fluid cytology confirmed HER2 positive disease  CT imaging from April 2025 showed positive response to therapy She will continue treatment as scheduled She has last pleural fluid drained from her Pleurx catheter on Monday and her lung exam is satisfactory I reviewed her chest x-ray which showed improvement in aeration I recommend the patient to skip drainage next Monday and to resume drainage on Monday, June 9 We will call her to estimate the quantity of pleural fluid drained and decide whether we can space out the drainage at home She has lost weight and I emphasized importance of high-protein intake I will adjust the dose of her treatment based on today's weight Her next imaging study would be around July

## 2023-10-02 NOTE — Patient Instructions (Signed)
 [facility] - A DEPT OF [facility]  Discharge Instructions: Thank you for choosing [facility] to provide your oncology and hematology care.   If you have a lab appointment with the Cancer Center, please go directly to the Cancer Center and check in at the registration area.   Wear comfortable clothing and clothing appropriate for easy access to any Portacath or PICC line.   We strive to give you quality time with your provider. You may need to reschedule your appointment if you arrive late (15 or more minutes).  Arriving late affects you and other patients whose appointments are after yours.  Also, if you miss three or more appointments without notifying the office, you may be dismissed from the clinic at the provider's discretion.      For prescription refill requests, have your pharmacy contact our office and allow 72 hours for refills to be completed.    Today you received the following chemotherapy and/or immunotherapy agents: Jemperli, Taxol, Carboplatin      To help prevent nausea and vomiting after your treatment, we encourage you to take your nausea medication as directed.  BELOW ARE SYMPTOMS THAT SHOULD BE REPORTED IMMEDIATELY: *FEVER GREATER THAN 100.4 F (38 C) OR HIGHER *CHILLS OR SWEATING *NAUSEA AND VOMITING THAT IS NOT CONTROLLED WITH YOUR NAUSEA MEDICATION *UNUSUAL SHORTNESS OF BREATH *UNUSUAL BRUISING OR BLEEDING *URINARY PROBLEMS (pain or burning when urinating, or frequent urination) *BOWEL PROBLEMS (unusual diarrhea, constipation, pain near the anus) TENDERNESS IN MOUTH AND THROAT WITH OR WITHOUT PRESENCE OF ULCERS (sore throat, sores in mouth, or a toothache) UNUSUAL RASH, SWELLING OR PAIN  UNUSUAL VAGINAL DISCHARGE OR ITCHING   Items with * indicate a potential emergency and should be followed up as soon as possible or go to the Emergency Department if any problems should occur.  Please show the CHEMOTHERAPY ALERT  CARD or IMMUNOTHERAPY ALERT CARD at check-in to the Emergency Department and triage nurse.  Should you have questions after your visit or need to cancel or reschedule your appointment, please contact [facility] - A DEPT OF [facility]  Dept: [phone]  and follow the prompts.  Office hours are 8:00 a.m. to 4:30 p.m. Monday - Friday. Please note that voicemails left after 4:00 p.m. may not be returned until the following business day.  We are closed weekends and major holidays. You have access to a nurse at all times for urgent questions. Please call the main number to the clinic Dept: [phone] and follow the prompts.   For any non-urgent questions, you may also contact your provider using MyChart. We now offer e-Visits for anyone 36 and older to request care online for non-urgent symptoms. For details visit [email or web address].   Also download the MyChart app! Go to the app store, search "MyChart", open the app, select [place], and log in with your MyChart username and password.

## 2023-10-02 NOTE — Progress Notes (Signed)
 [facility] OFFICE PROGRESS NOTE  Patient Care Team: Imelda Man, MD as PCP - General (Internal Medicine) Bridgette Campus, MD as PCP - Cardiology (Cardiology) Ferne Hoyle Chriss Coup, RN as Oncology Nurse Navigator (Oncology)  Assessment & Plan Recurrent pleural effusion Her right lung aeration has improved with placement of Pleurx catheter I have reviewed her chest x-ray today and examination confirm good aeration As above, I plan to space out the drainage to every other week Metastasis from cancer of uterus [facility]) The patient initially presented in April 2024 with postmenopausal bleeding.  CT imaging revealed stage IV disease with metastatic cancer to the pleural fluid as well as peritoneal metastasis Biopsy review high-grade serous carcinoma, MMR normal, Her2 positive, FISh 3+, MSI stable, High grade serous, PIK3CA pathogenic variant, p53 mutated  She received neoadjuvant chemotherapy with combination of carboplatin , paclitaxel  and trastuzumab  with excellent response to therapy, followed by interval debulking surgery and then adjuvant chemotherapy, completed by November 2024, followed by maintenance trastuzumab , then had disease recurrence February 2025 with malignant pleural effusion and abdominal lymphadenopathy Repeat fluid cytology confirmed HER2 positive disease  CT imaging from April 2025 showed positive response to therapy She will continue treatment as scheduled She has last pleural fluid drained from her Pleurx catheter on Monday and her lung exam is satisfactory I reviewed her chest x-ray which showed improvement in aeration I recommend the patient to skip drainage next Monday and to resume drainage on Monday, June 9 We will call her to estimate the quantity of pleural fluid drained and decide whether we can space out the drainage at home She has lost weight and I emphasized importance of high-protein intake I will adjust the dose of her treatment based on today's  weight Her next imaging study would be around July   Pancytopenia, acquired [facility]) She has severe pancytopenia due to treatment but relatively asymptomatic We will proceed with treatment with dose reduction I plan to continue her treatment interval at every 4 weeks  Orders Placed This Encounter  Procedures   DG Chest 2 View    Standing Status:   Future    Number of Occurrences:   1    Expected Date:   10/02/2023    Expiration Date:   10/01/2024    Reason for exam::   recurrent pleural effusion    Preferred imaging location?:   [facility]     Almeda Jacobs, MD  INTERVAL HISTORY: she returns for treatment follow-up Complications related to previous cycle of chemotherapy included pancytopenia,, weight loss,, and recurrent pleural effusion I reviewed her documentation of pleural fluid drained over the last few weeks This week, less than 50 cc was drained She denies shortness of breath Denies peripheral neuropathy from prior chemo The patient denies any recent signs or symptoms of bleeding such as spontaneous epistaxis, hematuria or hematochezia. She was surprised that she has lost weight because she thinks she has good appetite.  In going through her diet history, it appears that she has low protein intake  PHYSICAL EXAMINATION: ECOG PERFORMANCE STATUS: 2 - Symptomatic, <50% confined to bed  Lab Results  Component Value Date   CAN125 71.6 (H) 04/09/2023   CAN125 211.0 (H) 12/19/2022   CAN125 840.0 (H) 10/17/2022      Latest Ref Rng & Units 10/02/2023    8:32 AM 09/03/2023    7:55 AM 08/27/2023    8:39 AM  CBC  WBC 4.0 - 10.5 K/uL 3.7  2.7  2.3  Hemoglobin 12.0 - 15.0 g/dL 9.4  9.4  9.5   Hematocrit 36.0 - 46.0 % 27.5  26.7  27.3   Platelets 150 - 400 K/uL 138  79  75       Chemistry      Component Value Date/Time   NA 138 10/02/2023 0832   NA 144 05/13/2018 1500   K 3.6 10/02/2023 0832   CL 104 10/02/2023 0832   CO2 27 10/02/2023 0832   BUN 14 10/02/2023 0832    BUN 13 05/13/2018 1500   CREATININE 0.55 10/02/2023 0832      Component Value Date/Time   CALCIUM  10.4 (H) 10/02/2023 0832   ALKPHOS 55 10/02/2023 0832   AST 14 (L) 10/02/2023 0832   ALT 8 10/02/2023 0832   BILITOT 0.5 10/02/2023 0832       Vitals:   10/02/23 0915  BP: 132/75  Pulse: 76  Resp: 18  Temp: 98.5 F (36.9 C)  SpO2: 97%   Filed Weights   10/02/23 0915  Weight: 167 lb (75.8 kg)  Chest examination reveal good breath sounds on the right lung  Other relevant data reviewed during this visit included CBC and CMP as well as chest x-ray from today

## 2023-10-02 NOTE — Assessment & Plan Note (Addendum)
 She has severe pancytopenia due to treatment but relatively asymptomatic We will proceed with treatment with dose reduction I plan to continue her treatment interval at every 4 weeks

## 2023-10-02 NOTE — Assessment & Plan Note (Addendum)
 Her right lung aeration has improved with placement of Pleurx catheter I have reviewed her chest x-ray today and examination confirm good aeration As above, I plan to space out the drainage to every other week

## 2023-10-12 ENCOUNTER — Other Ambulatory Visit: Payer: Self-pay | Admitting: Hematology and Oncology

## 2023-10-12 ENCOUNTER — Telehealth: Payer: Self-pay

## 2023-10-12 DIAGNOSIS — D630 Anemia in neoplastic disease: Secondary | ICD-10-CM | POA: Diagnosis not present

## 2023-10-12 DIAGNOSIS — I10 Essential (primary) hypertension: Secondary | ICD-10-CM | POA: Diagnosis not present

## 2023-10-12 DIAGNOSIS — D6481 Anemia due to antineoplastic chemotherapy: Secondary | ICD-10-CM | POA: Diagnosis not present

## 2023-10-12 DIAGNOSIS — Z4801 Encounter for change or removal of surgical wound dressing: Secondary | ICD-10-CM | POA: Diagnosis not present

## 2023-10-12 DIAGNOSIS — E1142 Type 2 diabetes mellitus with diabetic polyneuropathy: Secondary | ICD-10-CM | POA: Diagnosis not present

## 2023-10-12 DIAGNOSIS — J90 Pleural effusion, not elsewhere classified: Secondary | ICD-10-CM

## 2023-10-12 DIAGNOSIS — K223 Perforation of esophagus: Secondary | ICD-10-CM | POA: Diagnosis not present

## 2023-10-12 DIAGNOSIS — I4891 Unspecified atrial fibrillation: Secondary | ICD-10-CM | POA: Diagnosis not present

## 2023-10-12 DIAGNOSIS — C799 Secondary malignant neoplasm of unspecified site: Secondary | ICD-10-CM | POA: Diagnosis not present

## 2023-10-12 DIAGNOSIS — D61818 Other pancytopenia: Secondary | ICD-10-CM | POA: Diagnosis not present

## 2023-10-12 DIAGNOSIS — Z48813 Encounter for surgical aftercare following surgery on the respiratory system: Secondary | ICD-10-CM | POA: Diagnosis not present

## 2023-10-12 DIAGNOSIS — D72829 Elevated white blood cell count, unspecified: Secondary | ICD-10-CM | POA: Diagnosis not present

## 2023-10-12 DIAGNOSIS — N390 Urinary tract infection, site not specified: Secondary | ICD-10-CM | POA: Diagnosis not present

## 2023-10-12 DIAGNOSIS — C55 Malignant neoplasm of uterus, part unspecified: Secondary | ICD-10-CM

## 2023-10-12 DIAGNOSIS — T451X5D Adverse effect of antineoplastic and immunosuppressive drugs, subsequent encounter: Secondary | ICD-10-CM | POA: Diagnosis not present

## 2023-10-12 DIAGNOSIS — J910 Malignant pleural effusion: Secondary | ICD-10-CM | POA: Diagnosis not present

## 2023-10-12 DIAGNOSIS — Z86718 Personal history of other venous thrombosis and embolism: Secondary | ICD-10-CM | POA: Diagnosis not present

## 2023-10-12 DIAGNOSIS — E440 Moderate protein-calorie malnutrition: Secondary | ICD-10-CM | POA: Diagnosis not present

## 2023-10-12 DIAGNOSIS — Z4803 Encounter for change or removal of drains: Secondary | ICD-10-CM | POA: Diagnosis not present

## 2023-10-12 DIAGNOSIS — G620 Drug-induced polyneuropathy: Secondary | ICD-10-CM | POA: Diagnosis not present

## 2023-10-12 DIAGNOSIS — G893 Neoplasm related pain (acute) (chronic): Secondary | ICD-10-CM | POA: Diagnosis not present

## 2023-10-12 DIAGNOSIS — Z7901 Long term (current) use of anticoagulants: Secondary | ICD-10-CM | POA: Diagnosis not present

## 2023-10-12 DIAGNOSIS — I69391 Dysphagia following cerebral infarction: Secondary | ICD-10-CM | POA: Diagnosis not present

## 2023-10-12 NOTE — Telephone Encounter (Signed)
 That is strange because I ordered CXR recently which showed she has some fluids present Can you get Maria Avila in tomorrow for CXR and the schedule a visit with me at 220 pm? I will order CXR

## 2023-10-12 NOTE — Telephone Encounter (Signed)
 Called [facility] back and told them the office will call tomorrow to follow up on future orders after Lekesha is seen in the office.  Called [facility] given appt at 2:20 pm to see Dr. Vilma Greathouse tomorrow. She will arrive before 1 pm to radiology to have chest xray prior to MD appt.

## 2023-10-12 NOTE — Telephone Encounter (Signed)
 Marylen Snowman nurse with [facility] called and left a message. Today, 6/6 she drained pleurx catheter and only got 0.5 ml of blood drainage.  She asking what Dr. Marton Sleeper wants to do going forward with pleurx catheter and home health orders.

## 2023-10-13 ENCOUNTER — Ambulatory Visit (HOSPITAL_COMMUNITY)
Admission: RE | Admit: 2023-10-13 | Discharge: 2023-10-13 | Disposition: A | Discharge status: Home / Self Care | Source: Ambulatory Visit | Attending: Hematology and Oncology | Admitting: Hematology and Oncology

## 2023-10-13 ENCOUNTER — Encounter: Payer: Self-pay | Admitting: Hematology and Oncology

## 2023-10-13 ENCOUNTER — Encounter (HOSPITAL_COMMUNITY): Payer: Self-pay

## 2023-10-13 ENCOUNTER — Other Ambulatory Visit: Payer: Self-pay | Admitting: Hematology and Oncology

## 2023-10-13 ENCOUNTER — Inpatient Hospital Stay: Attending: Gynecologic Oncology | Admitting: Hematology and Oncology

## 2023-10-13 VITALS — BP 134/72 | HR 70 | Temp 98.8°F | Resp 18 | Ht 62.0 in | Wt 165.4 lb

## 2023-10-13 DIAGNOSIS — C799 Secondary malignant neoplasm of unspecified site: Secondary | ICD-10-CM | POA: Diagnosis not present

## 2023-10-13 DIAGNOSIS — I4891 Unspecified atrial fibrillation: Secondary | ICD-10-CM | POA: Diagnosis not present

## 2023-10-13 DIAGNOSIS — Z79633 Long term (current) use of mitotic inhibitor: Secondary | ICD-10-CM | POA: Insufficient documentation

## 2023-10-13 DIAGNOSIS — J90 Pleural effusion, not elsewhere classified: Secondary | ICD-10-CM

## 2023-10-13 DIAGNOSIS — C786 Secondary malignant neoplasm of retroperitoneum and peritoneum: Secondary | ICD-10-CM | POA: Insufficient documentation

## 2023-10-13 DIAGNOSIS — Z5111 Encounter for antineoplastic chemotherapy: Secondary | ICD-10-CM | POA: Insufficient documentation

## 2023-10-13 DIAGNOSIS — Z79630 Long term (current) use of alkylating agent: Secondary | ICD-10-CM | POA: Insufficient documentation

## 2023-10-13 DIAGNOSIS — Z5112 Encounter for antineoplastic immunotherapy: Secondary | ICD-10-CM | POA: Insufficient documentation

## 2023-10-13 DIAGNOSIS — Z4682 Encounter for fitting and adjustment of non-vascular catheter: Secondary | ICD-10-CM | POA: Diagnosis not present

## 2023-10-13 DIAGNOSIS — D701 Agranulocytosis secondary to cancer chemotherapy: Secondary | ICD-10-CM | POA: Insufficient documentation

## 2023-10-13 DIAGNOSIS — C55 Malignant neoplasm of uterus, part unspecified: Secondary | ICD-10-CM

## 2023-10-13 DIAGNOSIS — J9811 Atelectasis: Secondary | ICD-10-CM | POA: Diagnosis not present

## 2023-10-13 DIAGNOSIS — G893 Neoplasm related pain (acute) (chronic): Secondary | ICD-10-CM

## 2023-10-13 HISTORY — PX: IR CHEST FLUORO: IMG2383

## 2023-10-13 MED ORDER — DEXAMETHASONE 4 MG PO TABS: ORAL_TABLET | ORAL | 6 refills | Status: DC

## 2023-10-13 MED ORDER — OXYCODONE HCL 5 MG PO TABS: 5.0000 mg | ORAL_TABLET | Freq: Four times a day (QID) | ORAL | 0 refills | Status: DC | PRN

## 2023-10-13 NOTE — Procedures (Signed)
 Pre procedural Dx: Occluded PleurX, pleural Post procedural Dx: Same  Unsuccessful manipulation of PleurX catheter.  The patient will return for replacement/new PleurX catheter  EBL: Trace Complications: None immediate  Zettie Hillock, MD Pager #: [phone]

## 2023-10-13 NOTE — Progress Notes (Signed)
 RE: ELIQUIS  Received: Today Almeda Jacobs, MD  Lucrezia Sachs, RN Yes last dose on 6/21 No eliquis  on 6/22 and 6/23  Plurex Replacement 6/24-TF

## 2023-10-13 NOTE — Assessment & Plan Note (Addendum)
 The patient initially presented in April 2024 with postmenopausal bleeding.  CT imaging revealed stage IV disease with metastatic cancer to the pleural fluid as well as peritoneal metastasis Biopsy review high-grade serous carcinoma, MMR normal, Her2 positive, FISh 3+, MSI stable, High grade serous, PIK3CA pathogenic variant, p53 mutated  She received neoadjuvant chemotherapy with combination of carboplatin , paclitaxel  and trastuzumab  with excellent response to therapy, followed by interval debulking surgery and then adjuvant chemotherapy, completed by November 2024, followed by maintenance trastuzumab , then had disease recurrence February 2025 with malignant pleural effusion and abdominal lymphadenopathy Repeat fluid cytology confirmed HER2 positive disease  CT imaging from April 2025 showed positive response to therapy She will continue treatment as scheduled

## 2023-10-13 NOTE — Assessment & Plan Note (Addendum)
 She will continue chronic anticoagulation therapy She has no recent bleeding from Eliquis  Continue treatment as long as her platelet count is above 75,000 I refilled her prescription today

## 2023-10-13 NOTE — Assessment & Plan Note (Addendum)
 According to home health, she has no drainage from Pleurx catheter on Monday She had felt some chest pressure but denies significant shortness of breath I repeated imaging study and noted that she has moderate amount of pleural fluid I believe that the Pleurx catheter is not working and she is directed to interventional radiologist for further management

## 2023-10-13 NOTE — Assessment & Plan Note (Addendum)
 She has some mild pain after placement of Pleurx catheter but her pain is now stable She will continue to take oxycodone  as needed I refilled her prescription today

## 2023-10-13 NOTE — Progress Notes (Signed)
 [facility] OFFICE PROGRESS NOTE  Patient Care Team: Imelda Man, MD as PCP - General (Internal Medicine) Amanda Jungling Tomas Fountain, MD (Inactive) as PCP - Cardiology (Cardiology) Ferne Hoyle Chriss Coup, RN as Oncology Nurse Navigator (Oncology)  Assessment & Plan Metastasis from cancer of uterus [facility]) The patient initially presented in April 2024 with postmenopausal bleeding.  CT imaging revealed stage IV disease with metastatic cancer to the pleural fluid as well as peritoneal metastasis Biopsy review high-grade serous carcinoma, MMR normal, Her2 positive, FISh 3+, MSI stable, High grade serous, PIK3CA pathogenic variant, p53 mutated  She received neoadjuvant chemotherapy with combination of carboplatin , paclitaxel  and trastuzumab  with excellent response to therapy, followed by interval debulking surgery and then adjuvant chemotherapy, completed by November 2024, followed by maintenance trastuzumab , then had disease recurrence February 2025 with malignant pleural effusion and abdominal lymphadenopathy Repeat fluid cytology confirmed HER2 positive disease  CT imaging from April 2025 showed positive response to therapy She will continue treatment as scheduled Recurrent pleural effusion According to home health, she has no drainage from Pleurx catheter on Monday She had felt some chest pressure but denies significant shortness of breath I repeated imaging study and noted that she has moderate amount of pleural fluid I believe that the Pleurx catheter is not working and she is directed to interventional radiologist for further management Cancer associated pain She has some mild pain after placement of Pleurx catheter but her pain is now stable She will continue to take oxycodone  as needed I refilled her prescription today Atrial fibrillation, unspecified type (HCC) She will continue chronic anticoagulation therapy She has no recent bleeding from Eliquis  Continue treatment as long as her  platelet count is above 75,000 I refilled her prescription today  Orders Placed This Encounter  Procedures   IR Radiologist Eval & Mgmt    Standing Status:   Future    Number of Occurrences:   1    Expected Date:   10/13/2023    Expiration Date:   10/12/2024    Reason for Exam (SYMPTOM  OR DIAGNOSIS REQUIRED):   non functioning Pleurx catheter    Preferred Imaging Location?:   [facility]     Almeda Jacobs, MD  INTERVAL HISTORY: she returns for surveillance follow-up due to nonfunctioning Pleurx catheter She is here accompanied by her husband She has some chest pressure and some shortness of breath but denies pain or cough She is taking oxycodone  as needed According to family, home health has attempted to drain fluid but nothing was aspirated recently From recent chemotherapy perspective, she denies new side effects although she have lost some weight The patient denies any recent signs or symptoms of bleeding such as spontaneous epistaxis, hematuria or hematochezia.   PHYSICAL EXAMINATION: ECOG PERFORMANCE STATUS: 1 - Symptomatic but completely ambulatory  Vitals:   10/13/23 1336  BP: 134/72  Pulse: 70  Resp: 18  Temp: 98.8 F (37.1 C)  SpO2: 99%   Filed Weights   10/13/23 1336  Weight: 165 lb 6.4 oz (75 kg)    Relevant data reviewed during this visit included chest x-ray

## 2023-10-17 ENCOUNTER — Other Ambulatory Visit: Payer: Self-pay | Admitting: Hematology and Oncology

## 2023-10-19 ENCOUNTER — Encounter: Payer: Self-pay | Admitting: Hematology and Oncology

## 2023-10-19 ENCOUNTER — Other Ambulatory Visit: Payer: Self-pay

## 2023-10-23 ENCOUNTER — Other Ambulatory Visit: Payer: Self-pay | Admitting: Hematology and Oncology

## 2023-10-26 ENCOUNTER — Other Ambulatory Visit: Payer: Self-pay | Admitting: Radiology

## 2023-10-26 DIAGNOSIS — J90 Pleural effusion, not elsewhere classified: Secondary | ICD-10-CM

## 2023-10-26 NOTE — H&P (Shared)
 Chief Complaint: Occluded right chest pleurx catheter, presenting for right chest pleurx replacement  Referring Provider(s): Lonn Hicks, MD  Supervising Physician: Jennefer Rover  Patient Status: [facility] - Out-pt  History of Present Illness: Maria Avila is a 73 y.o. female with pmhx of HLD, HTN, prior stroke 2019 and stage IV uterine cancer initially diagnosed April 2024. She has had recurrent symptomatic right pleural effusion since diagnosis that has required repeat thoracentesis. She had right PleurX catheter placed 09/07/23 by Dr. Luverne. She then developed decreased output first noticed on 10/09/23 and had attempted PleurX manipulation on 10/13/23 with Dr. Jenna, which was unsuccessful, and was then scheduled for PleurX replacement, which is reason for visit today.   Patient is Full Code  Past Medical History:  Diagnosis Date   Colitis    Endometrial cancer [facility])    History of radiation therapy    Vagina-04/20/23-05/11/23- Dr. Lynwood Nasuti   Hyperlipidemia    Hypertension    Malnutrition (HCC)    Stroke [facility]) 2019   balance still affected    Past Surgical History:  Procedure Laterality Date   BIOPSY  02/26/2018   Procedure: BIOPSY;  Surgeon: Dianna Specking, MD;  Location: [facility] ENDOSCOPY;  Service: Endoscopy;;   COLONOSCOPY WITH PROPOFOL  N/A 02/26/2018   Procedure: COLONOSCOPY WITH PROPOFOL ;  Surgeon: Dianna Specking, MD;  Location[facility] ENDOSCOPY;  Service: Endoscopy;  Laterality: N/A;   ESOPHAGOGASTRODUODENOSCOPY (EGD) WITH PROPOFOL  N/A 02/26/2018   Procedure: ESOPHAGOGASTRODUODENOSCOPY (EGD) WITH PROPOFOL ;  Surgeon: Dianna Specking, MD;  Location: [facility] ENDOSCOPY;  Service: Endoscopy;  Laterality: N/A;   IR CHEST FLUORO  10/13/2023   IR IMAGING GUIDED PORT INSERTION  10/21/2022   IR PERC PLEURAL DRAIN W/INDWELL CATH W/IMG GUIDE  09/07/2023   NO PAST SURGERIES     UNCERTAIN OF NAME OF SURGERY    Allergies: Crestor [rosuvastatin calcium ] and  Simvastatin  Medications: Prior to Admission medications   Medication Sig Start Date End Date Taking? Authorizing Provider  apixaban  (ELIQUIS ) 5 MG TABS tablet Take 1 tablet (5 mg total) by mouth 2 (two) times daily. 07/10/23   Lonn Hicks, MD  benazepril -hydrochlorthiazide (LOTENSIN  HCT) 20-25 MG tablet Take 1 tablet by mouth daily. 11/22/19   [provider]  [employer] VITAMIN C  500 MG tablet TAKE 1 TABLET BY MOUTH TWICE A DAY 05/26/18   Debby Fidela CROME, NP  dexamethasone  (DECADRON ) 4 MG tablet Take 2 tabs at the night before and 2 tab the morning of chemotherapy, every 3 weeks, by mouth x 6 cycles 10/13/23   Lonn Hicks, MD  lidocaine -prilocaine  (EMLA ) cream Apply 1 Application topically daily as needed (prior to port access). 06/19/23   Lonn Hicks, MD  magnesium  oxide (MAG-OX) 400 (240 Mg) MG tablet Take 1 tablet (400 mg total) by mouth daily. 10/19/23   Lonn Hicks, MD  Multiple Vitamin (MULTIVITAMIN) capsule Take 1 capsule by mouth daily with breakfast. 07/21/17   [provider]  ondansetron  (ZOFRAN ) 8 MG tablet Take 1 tablet (8 mg total) by mouth every 8 (eight) hours as needed for nausea. 10/23/22   Lonn Hicks, MD  oxyCODONE  (OXY IR/ROXICODONE ) 5 MG immediate release tablet Take 1 tablet (5 mg total) by mouth every 6 (six) hours as needed for severe pain (pain score 7-10). 10/13/23   Lonn Hicks, MD  prochlorperazine  (COMPAZINE ) 10 MG tablet Take 1 tablet (10 mg total) by mouth every 6 (six) hours as needed for nausea or vomiting. 10/23/22   Lonn Hicks, MD  sennosides-docusate sodium  (SENOKOT-S)  8.6-50 MG tablet Take 1 tablet by mouth 2 (two) times daily as needed for constipation.    [provider]  thiamine  (VITAMIN B-1) 100 MG tablet TAKE 1 TABLET BY MOUTH EVERY DAY Patient taking differently: Take 100 mg by mouth daily. 05/25/18   Kirsteins, Prentice BRAVO, MD  TYLENOL  8 HOUR ARTHRITIS PAIN 650 MG CR tablet Take 650-1,300 mg by mouth every 8 (eight) hours as needed for pain.     [provider]     Family History  Problem Relation Age of Onset   Alzheimer's disease Mother    Colon cancer Father    Endometrial cancer Sister        gyn, unsure type   Breast cancer Neg Hx    Ovarian cancer Neg Hx    Prostate cancer Neg Hx    Pancreatic cancer Neg Hx     Social History   Socioeconomic History   Marital status: Married    Spouse name: Not on file   Number of children: Not on file   Years of education: Not on file   Highest education level: Not on file  Occupational History   Not on file  Tobacco Use   Smoking status: Former    Current packs/day: 0.00    Types: Cigarettes    Quit date: 76    Years since quitting: 31.4   Smokeless tobacco: Never  Vaping Use   Vaping status: Never Used  Substance and Sexual Activity   Alcohol use: Yes    Comment: occ wine   Drug use: Not Currently   Sexual activity: Yes  Other Topics Concern   Not on file  Social History Narrative   Not on file   Social Drivers of Health   Financial Resource Strain: Not on file  Food Insecurity: No Food Insecurity (06/30/2023)   Hunger Vital Sign    Worried About Running Out of Food in the Last Year: Never true    Ran Out of Food in the Last Year: Never true  Transportation Needs: No Transportation Needs (06/30/2023)   PRAPARE - Administrator, Civil Service (Medical): No    Lack of Transportation (Non-Medical): No  Physical Activity: Not on file  Stress: Not on file  Social Connections: Socially Isolated (06/30/2023)   Social Connection and Isolation Panel    Frequency of Communication with Friends and Family: Once a week    Frequency of Social Gatherings with Friends and Family: Never    Attends Religious Services: Never    Database administrator or Organizations: No    Attends Engineer, structural: Never    Marital Status: Married     Review of Systems: A 12 point ROS discussed and pertinent positives are indicated in the HPI above.   All other systems are negative.  Review of Systems  Vital Signs: There were no vitals taken for this visit.  Advance Care Plan: No documents on file.  Physical Exam  Imaging: IR Chest Fluoro Result Date: 10/14/2023 CLINICAL DATA:  Nonfunctioning right-sided PleurX catheter EXAM: CHEST FLUOROSCOPY COMPARISON:  None Available. FINDINGS: The patient was brought to the interventional suite and placed in a supine position on the table. The external portion of the PleurX catheter at the right upper quadrant were prepped and draped in the usual sterile fashion. A combination of guidewire, catheter, and fluoroscopy was used to manipulate the right-sided PleurX catheter by advancing the guidewires and catheters into the PleurX catheter in order to  alleviate the occlusions. Multiple attempts were made, however the catheter and guidewire could not be completely advanced through the PleurX catheter. The patient tolerated the procedure well, however without breaking free the occlusions the patient would likely benefit from replacement or exchange of this tunneled PleurX catheter. IMPRESSION: Nonfunctioning right-sided PleurX catheter. Wire manipulation and aggressive flushing did not alleviate the occlusion. The patient will be scheduled for a replacement or exchange of the right-sided PleurX catheter. Electronically Signed   By: Cordella Banner   On: 10/14/2023 09:16   DG Chest 2 View Result Date: 10/13/2023 CLINICAL DATA:  recurrent pleural effusion s/p pleurx placement, minimum drainage, ?loculation EXAM: CHEST - 2 VIEW COMPARISON:  Oct 02, 2023 FINDINGS: Right chest port in place terminating at the cavoatrial junction. Similar layering right pleural effusion with right basilar atelectasis and a pleural drainage catheter in place. No new airspace consolidation or pneumothorax. No cardiomegaly. No acute fracture or destructive lesion. Multilevel thoracic osteophytosis. IMPRESSION: Similar appearance of the  right pleural effusion with right pleural drainage catheter in place. Electronically Signed   By: Rogelia Myers M.D.   On: 10/13/2023 13:21   DG Chest 2 View Result Date: 10/10/2023 CLINICAL DATA:  Recurrent pleural effusion EXAM: CHEST - 2 VIEW COMPARISON:  09/07/2023 FINDINGS: Cardiac shadow is within normal limits. Right chest wall port is again seen. Right PleurX catheter is noted. Pleural effusion is seen similar to that noted on the prior exam. IMPRESSION: Right-sided pleural effusion with PleurX catheter in place. Electronically Signed   By: Oneil Devonshire M.D.   On: 10/10/2023 01:11    Labs:  CBC: Recent Labs    08/06/23 0907 08/27/23 0839 09/03/23 0755 10/02/23 0832  WBC 2.7* 2.3* 2.7* 3.7*  HGB 10.6* 9.5* 9.4* 9.4*  HCT 31.4* 27.3* 26.7* 27.5*  PLT 95* 75* 79* 138*    COAGS: Recent Labs    07/01/23 1231 07/02/23 0529 07/03/23 0410 07/07/23 1447  INR 1.0 1.1 1.1 2.3    BMP: Recent Labs    08/06/23 0907 08/27/23 0839 09/03/23 0755 10/02/23 0832  NA 140 138 141 138  K 3.6 3.1* 3.5 3.6  CL 105 104 106 104  CO2 27 29 28 27   GLUCOSE 98 84 97 126*  BUN 9 12 12 14   CALCIUM  9.5 9.7 9.4 10.4*  CREATININE 0.66 0.62 0.61 0.55  GFRNONAA >60 >60 >60 >60    LIVER FUNCTION TESTS: Recent Labs    08/06/23 0907 08/27/23 0839 09/03/23 0755 10/02/23 0832  BILITOT 0.3 0.4 0.4 0.5  AST 14* 14* 12* 14*  ALT 9 7 7 8   ALKPHOS 48 50 53 55  PROT 6.9 7.2 7.2 8.0  ALBUMIN  4.0 4.1 4.0 4.5    TUMOR MARKERS: No results for input(s): AFPTM, CEA, CA199, CHROMGRNA in the last 8760 hours.  Assessment and Plan:  Maria Avila is a 73 y.o. female with pmhx of HLD, HTN, prior stroke 2019 and stage IV uterine cancer initially diagnosed April 2024. She has had recurrent symptomatic right pleural effusion since diagnosis that has required repeat thoracentesis. She had right PleurX catheter placed 09/07/23 by Dr. Luverne. She then developed decreased output first noticed on  10/09/23 and had attempted PleurX manipulation on 10/13/23 with Dr. Banner, which was unsuccessful, and was then scheduled for PleurX replacement, which is reason for visit today.   Risks and benefits discussed with the patient including bleeding, infection, damage to adjacent structures, malfunction of the catheter with need for additional procedures.  All  of the patient's questions were answered, patient is agreeable to proceed. Consent signed and in chart.   Thank you for allowing our service to participate in Maria Avila 's care.  Electronically Signed: Kimble VEAR Clas, PA-C   10/26/2023, 3:43 PM      I spent a total of  30 Minutes   in face to face in clinical consultation, greater than 50% of which was counseling/coordinating care for right PleurX catheter replacement.

## 2023-10-27 ENCOUNTER — Other Ambulatory Visit: Payer: Self-pay

## 2023-10-27 ENCOUNTER — Ambulatory Visit (HOSPITAL_COMMUNITY)
Admission: RE | Admit: 2023-10-27 | Discharge: 2023-10-27 | Disposition: A | Discharge status: Home / Self Care | Source: Ambulatory Visit

## 2023-10-27 ENCOUNTER — Other Ambulatory Visit: Payer: Self-pay | Admitting: Hematology and Oncology

## 2023-10-27 ENCOUNTER — Encounter (HOSPITAL_COMMUNITY): Payer: Self-pay

## 2023-10-27 ENCOUNTER — Telehealth: Payer: Self-pay

## 2023-10-27 DIAGNOSIS — J90 Pleural effusion, not elsewhere classified: Secondary | ICD-10-CM | POA: Insufficient documentation

## 2023-10-27 DIAGNOSIS — I10 Essential (primary) hypertension: Secondary | ICD-10-CM | POA: Insufficient documentation

## 2023-10-27 DIAGNOSIS — Z87891 Personal history of nicotine dependence: Secondary | ICD-10-CM | POA: Insufficient documentation

## 2023-10-27 DIAGNOSIS — C55 Malignant neoplasm of uterus, part unspecified: Secondary | ICD-10-CM

## 2023-10-27 DIAGNOSIS — E785 Hyperlipidemia, unspecified: Secondary | ICD-10-CM | POA: Diagnosis not present

## 2023-10-27 DIAGNOSIS — Z8673 Personal history of transient ischemic attack (TIA), and cerebral infarction without residual deficits: Secondary | ICD-10-CM | POA: Insufficient documentation

## 2023-10-27 DIAGNOSIS — C799 Secondary malignant neoplasm of unspecified site: Secondary | ICD-10-CM | POA: Diagnosis not present

## 2023-10-27 DIAGNOSIS — T85698A Other mechanical complication of other specified internal prosthetic devices, implants and grafts, initial encounter: Secondary | ICD-10-CM | POA: Diagnosis not present

## 2023-10-27 DIAGNOSIS — D6481 Anemia due to antineoplastic chemotherapy: Secondary | ICD-10-CM

## 2023-10-27 DIAGNOSIS — Z4682 Encounter for fitting and adjustment of non-vascular catheter: Secondary | ICD-10-CM | POA: Insufficient documentation

## 2023-10-27 DIAGNOSIS — D61818 Other pancytopenia: Secondary | ICD-10-CM

## 2023-10-27 HISTORY — PX: IR PERC PLEURAL DRAIN W/INDWELL CATH W/IMG GUIDE: IMG5383

## 2023-10-27 HISTORY — PX: IR REMOVAL OF PLURAL CATH W/CUFF: IMG5346

## 2023-10-27 LAB — CBC WITH DIFFERENTIAL/PLATELET
Abs Immature Granulocytes: 0.01 10*3/uL (ref 0.00–0.07)
Basophils Absolute: 0 10*3/uL (ref 0.0–0.1)
Basophils Relative: 0 %
Eosinophils Absolute: 0 10*3/uL (ref 0.0–0.5)
Eosinophils Relative: 1 %
HCT: 24.8 % — ABNORMAL LOW (ref 36.0–46.0)
Hemoglobin: 7.9 g/dL — ABNORMAL LOW (ref 12.0–15.0)
Immature Granulocytes: 0 %
Lymphocytes Relative: 43 %
Lymphs Abs: 1.4 10*3/uL (ref 0.7–4.0)
MCH: 32.8 pg (ref 26.0–34.0)
MCHC: 31.9 g/dL (ref 30.0–36.0)
MCV: 102.9 fL — ABNORMAL HIGH (ref 80.0–100.0)
Monocytes Absolute: 0.2 10*3/uL (ref 0.1–1.0)
Monocytes Relative: 8 %
Neutro Abs: 1.5 10*3/uL — ABNORMAL LOW (ref 1.7–7.7)
Neutrophils Relative %: 48 %
Platelets: 102 10*3/uL — ABNORMAL LOW (ref 150–400)
RBC: 2.41 MIL/uL — ABNORMAL LOW (ref 3.87–5.11)
RDW: 16.1 % — ABNORMAL HIGH (ref 11.5–15.5)
WBC: 3.2 10*3/uL — ABNORMAL LOW (ref 4.0–10.5)
nRBC: 0 % (ref 0.0–0.2)

## 2023-10-27 LAB — BASIC METABOLIC PANEL WITH GFR
Anion gap: 10 (ref 5–15)
BUN: 11 mg/dL (ref 8–23)
CO2: 25 mmol/L (ref 22–32)
Calcium: 9.4 mg/dL (ref 8.9–10.3)
Chloride: 105 mmol/L (ref 98–111)
Creatinine, Ser: 0.58 mg/dL (ref 0.44–1.00)
GFR, Estimated: >60 mL/min (ref >60)
Glucose, Bld: 90 mg/dL (ref 70–99)
Potassium: 3.4 mmol/L — ABNORMAL LOW (ref 3.5–5.1)
Sodium: 140 mmol/L (ref 135–145)

## 2023-10-27 LAB — PROTIME-INR
INR: 1.1 (ref 0.8–1.2)
Prothrombin Time: 14.6 s (ref 11.4–15.2)

## 2023-10-27 MED ORDER — CEFAZOLIN SODIUM-DEXTROSE 2-4 GM/100ML-% IV SOLN
INTRAVENOUS | Status: AC
  Filled 2023-10-27: qty 100

## 2023-10-27 MED ORDER — MIDAZOLAM HCL 2 MG/2ML IJ SOLN
INTRAMUSCULAR | Status: AC
  Filled 2023-10-27: qty 2

## 2023-10-27 MED ORDER — CEFAZOLIN SODIUM-DEXTROSE 2-4 GM/100ML-% IV SOLN: 2.0000 g | INTRAVENOUS | Status: AC

## 2023-10-27 MED ORDER — HEPARIN SOD (PORK) LOCK FLUSH 100 UNIT/ML IV SOLN
500.0000 [IU] | Freq: Once | INTRAVENOUS | Status: AC
  Administered 2023-10-27: 500 [IU] via INTRAVENOUS
  Filled 2023-10-27: qty 5

## 2023-10-27 MED ORDER — LIDOCAINE-EPINEPHRINE 1 %-1:100000 IJ SOLN
20.0000 mL | Freq: Once | INTRAMUSCULAR | Status: AC
  Administered 2023-10-27: 20 mL via INTRADERMAL

## 2023-10-27 MED ORDER — FENTANYL CITRATE (PF) 100 MCG/2ML IJ SOLN
INTRAMUSCULAR | Status: AC
  Filled 2023-10-27: qty 2

## 2023-10-27 MED ORDER — MIDAZOLAM HCL 2 MG/2ML IJ SOLN
INTRAMUSCULAR | Status: AC | PRN
  Administered 2023-10-27 (×3): 1 mg via INTRAVENOUS

## 2023-10-27 MED ORDER — LIDOCAINE-EPINEPHRINE 1 %-1:100000 IJ SOLN
INTRAMUSCULAR | Status: AC
Start: 2023-10-27 — End: 2023-10-27
  Filled 2023-10-27: qty 1

## 2023-10-27 MED ORDER — SODIUM CHLORIDE 0.9 % IV SOLN: INTRAVENOUS | Status: DC

## 2023-10-27 MED ORDER — CEFAZOLIN SODIUM-DEXTROSE 2-4 GM/100ML-% IV SOLN
INTRAVENOUS | Status: AC | PRN
  Administered 2023-10-27: 2 g via INTRAVENOUS

## 2023-10-27 MED ORDER — FENTANYL CITRATE (PF) 100 MCG/2ML IJ SOLN
INTRAMUSCULAR | Status: AC | PRN
  Administered 2023-10-27 (×3): 50 ug via INTRAVENOUS

## 2023-10-27 NOTE — Sedation Documentation (Signed)
 Right Pleurx cathether successfully placed. of fluid removed.

## 2023-10-27 NOTE — Sedation Documentation (Signed)
 RN Talibah Colasurdo pulled 4 mg Versed  and 200 mcg Fentanyl  in Ir room pysix. Pt. Received 3 mg Versed  and 150 mcg Fentanyl  throughout the procedure. RN Maui Ahart wasted  1 mg Versed  and  50 mcg Fentanyl  with Nena Gasmen RN in IR med room pysix.

## 2023-10-27 NOTE — Telephone Encounter (Signed)
 Called husband, HGB today 7.9 with IR procedure. Given appt for lab at 8 am tomorrow with 1 unit of blood at 0845. Husband is aware of appts.

## 2023-10-27 NOTE — Procedures (Signed)
 Interventional Radiology Procedure Note  Procedure:  1) Placement of new right tunneled pleurex catheter 2) Removal of indwelling right tunneled pleurex catheter  Findings: Please refer to procedural dictation for full description. New catheter placed via same skin entry site, however new thoracostomy site obtained with more favorable positioning of catheter within posterior right pleura.  Approximately 550 mL of serous fluid aspirated after new catheter placement.  Complications: none immediate  Estimated Blood Loss: < 5 mL  Recommendations: Pleurex ready for immediate use, as needed.   Ester Sides, MD

## 2023-10-27 NOTE — Discharge Instructions (Signed)
 Discharge Instructions:   Please call Interventional Radiology clinic [phone] with any questions or concerns.  Change dressing weekly as before by Home Health nurse.  Cover dressing to shower.  Moderate Conscious Sedation, Adult, Care After  This sheet gives you information about how to care for yourself after your procedure. Your health care provider may also give you more specific instructions. If you have problems or questions, contact your health care provider. What can I expect after the procedure? After the procedure, it is common to have: Sleepiness for several hours. Impaired judgment for several hours. Difficulty with balance. Vomiting if you eat too soon. Follow these instructions at home: For the time period you were told by your health care provider: Rest. Do not participate in activities where you could fall or become injured. Do not drive or use machinery. Do not drink alcohol. Do not take sleeping pills or medicines that cause drowsiness. Do not make important decisions or sign legal documents. Do not take care of children on your own. Eating and drinking  Follow the diet recommended by your health care provider. Drink enough fluid to keep your urine pale yellow. If you vomit: Drink water , juice, or soup when you can drink without vomiting. Make sure you have little or no nausea before eating solid foods. General instructions Take over-the-counter and prescription medicines only as told by your health care provider. Have a responsible adult stay with you for the time you are told. It is important to have someone help care for you until you are awake and alert. Do not smoke. Keep all follow-up visits as told by your health care provider. This is important. Contact a health care provider if: You are still sleepy or having trouble with balance after 24 hours. You feel light-headed. You keep feeling nauseous or you keep vomiting. You develop a rash. You have a  fever. You have redness or swelling around the IV site. Get help right away if: You have trouble breathing. You have new-onset confusion at home. Summary After the procedure, it is common to feel sleepy, have impaired judgment, or feel nauseous if you eat too soon. Rest after you get home. Know the things you should not do after the procedure. Follow the diet recommended by your health care provider and drink enough fluid to keep your urine pale yellow. Get help right away if you have trouble breathing or new-onset confusion at home. This information is not intended to replace advice given to you by your health care provider. Make sure you discuss any questions you have with your health care provider. Document Revised: 08/19/2019 Document Reviewed: 03/17/2019 Elsevier Patient Education  2023 [employer].

## 2023-10-28 ENCOUNTER — Inpatient Hospital Stay

## 2023-10-28 ENCOUNTER — Inpatient Hospital Stay: Admitting: Hematology and Oncology

## 2023-10-28 DIAGNOSIS — D6481 Anemia due to antineoplastic chemotherapy: Secondary | ICD-10-CM

## 2023-10-28 DIAGNOSIS — C55 Malignant neoplasm of uterus, part unspecified: Secondary | ICD-10-CM

## 2023-10-28 DIAGNOSIS — Z79633 Long term (current) use of mitotic inhibitor: Secondary | ICD-10-CM | POA: Diagnosis not present

## 2023-10-28 DIAGNOSIS — Z5112 Encounter for antineoplastic immunotherapy: Secondary | ICD-10-CM | POA: Diagnosis not present

## 2023-10-28 DIAGNOSIS — Z79630 Long term (current) use of alkylating agent: Secondary | ICD-10-CM | POA: Diagnosis not present

## 2023-10-28 DIAGNOSIS — D61818 Other pancytopenia: Secondary | ICD-10-CM

## 2023-10-28 DIAGNOSIS — D701 Agranulocytosis secondary to cancer chemotherapy: Secondary | ICD-10-CM | POA: Diagnosis not present

## 2023-10-28 DIAGNOSIS — C786 Secondary malignant neoplasm of retroperitoneum and peritoneum: Secondary | ICD-10-CM | POA: Diagnosis not present

## 2023-10-28 DIAGNOSIS — Z5111 Encounter for antineoplastic chemotherapy: Secondary | ICD-10-CM | POA: Diagnosis not present

## 2023-10-28 LAB — CBC WITH DIFFERENTIAL/PLATELET
Abs Immature Granulocytes: 0 10*3/uL (ref 0.00–0.07)
Basophils Absolute: 0 10*3/uL (ref 0.0–0.1)
Basophils Relative: 0 %
Eosinophils Absolute: 0 10*3/uL (ref 0.0–0.5)
Eosinophils Relative: 1 %
HCT: 24.5 % — ABNORMAL LOW (ref 36.0–46.0)
Hemoglobin: 8.2 g/dL — ABNORMAL LOW (ref 12.0–15.0)
Immature Granulocytes: 0 %
Lymphocytes Relative: 27 %
Lymphs Abs: 0.9 10*3/uL (ref 0.7–4.0)
MCH: 33.1 pg (ref 26.0–34.0)
MCHC: 33.5 g/dL (ref 30.0–36.0)
MCV: 98.8 fL (ref 80.0–100.0)
Monocytes Absolute: 0.2 10*3/uL (ref 0.1–1.0)
Monocytes Relative: 6 %
Neutro Abs: 2.3 10*3/uL (ref 1.7–7.7)
Neutrophils Relative %: 66 %
Platelets: 103 10*3/uL — ABNORMAL LOW (ref 150–400)
RBC: 2.48 MIL/uL — ABNORMAL LOW (ref 3.87–5.11)
RDW: 15.9 % — ABNORMAL HIGH (ref 11.5–15.5)
WBC: 3.5 10*3/uL — ABNORMAL LOW (ref 4.0–10.5)
nRBC: 0 % (ref 0.0–0.2)

## 2023-10-28 LAB — TYPE AND SCREEN
ABO/RH(D): O POS
Antibody Screen: NEGATIVE

## 2023-10-28 LAB — COMPREHENSIVE METABOLIC PANEL WITH GFR
ALT: 7 U/L (ref 0–44)
AST: 12 U/L — ABNORMAL LOW (ref 15–41)
Albumin: 4.1 g/dL (ref 3.5–5.0)
Alkaline Phosphatase: 50 U/L (ref 38–126)
Anion gap: 8 (ref 5–15)
BUN: 11 mg/dL (ref 8–23)
CO2: 26 mmol/L (ref 22–32)
Calcium: 9.6 mg/dL (ref 8.9–10.3)
Chloride: 105 mmol/L (ref 98–111)
Creatinine, Ser: 0.59 mg/dL (ref 0.44–1.00)
GFR, Estimated: >60 mL/min (ref >60)
Glucose, Bld: 94 mg/dL (ref 70–99)
Potassium: 3.5 mmol/L (ref 3.5–5.1)
Sodium: 139 mmol/L (ref 135–145)
Total Bilirubin: 0.5 mg/dL (ref 0.0–1.2)
Total Protein: 7.4 g/dL (ref 6.5–8.1)

## 2023-10-28 MED ORDER — HEPARIN SOD (PORK) LOCK FLUSH 100 UNIT/ML IV SOLN
500.0000 [IU] | Freq: Every day | INTRAVENOUS | Status: AC | PRN
  Administered 2023-10-28: 500 [IU]

## 2023-10-28 MED ORDER — SODIUM CHLORIDE 0.9% FLUSH
10.0000 mL | Freq: Once | INTRAVENOUS | Status: AC
  Administered 2023-10-28: 10 mL

## 2023-10-28 MED ORDER — SODIUM CHLORIDE 0.9% FLUSH
10.0000 mL | INTRAVENOUS | Status: AC | PRN
  Administered 2023-10-28: 10 mL

## 2023-10-28 MED ORDER — SODIUM CHLORIDE 0.9% IV SOLUTION: 250.0000 mL | INTRAVENOUS | Status: DC

## 2023-10-28 MED ORDER — ACETAMINOPHEN 325 MG PO TABS
650.0000 mg | ORAL_TABLET | Freq: Once | ORAL | Status: DC
  Filled 2023-10-28: qty 2

## 2023-10-28 MED ORDER — DIPHENHYDRAMINE HCL 25 MG PO CAPS
25.0000 mg | ORAL_CAPSULE | Freq: Once | ORAL | Status: DC
  Filled 2023-10-28: qty 1

## 2023-10-28 NOTE — Progress Notes (Deleted)
 The patient is noted to have abnormal treatment parameters as follows:  After discussion with Lacie Burton, NP decision is made to proceed with treatment today despite abnormal Creatinine  Lab Results  Component Value Date   CREATININE 0.58 10/27/2023   and Respirations: ***

## 2023-10-28 NOTE — Progress Notes (Signed)
 Hgb 8.2 g/dL today, per Dr. Lonn NO blood transfusion needed today. This RN made Pt and spouse aware. Pt and family verbalized understanding and were agreeable with plan.  This RN made blood bank aware, Prepare RBC order canceled.

## 2023-10-29 ENCOUNTER — Telehealth: Payer: Self-pay

## 2023-10-29 DIAGNOSIS — D72829 Elevated white blood cell count, unspecified: Secondary | ICD-10-CM | POA: Diagnosis not present

## 2023-10-29 DIAGNOSIS — D6481 Anemia due to antineoplastic chemotherapy: Secondary | ICD-10-CM | POA: Diagnosis not present

## 2023-10-29 DIAGNOSIS — I4891 Unspecified atrial fibrillation: Secondary | ICD-10-CM | POA: Diagnosis not present

## 2023-10-29 DIAGNOSIS — E440 Moderate protein-calorie malnutrition: Secondary | ICD-10-CM | POA: Diagnosis not present

## 2023-10-29 DIAGNOSIS — G620 Drug-induced polyneuropathy: Secondary | ICD-10-CM | POA: Diagnosis not present

## 2023-10-29 DIAGNOSIS — J910 Malignant pleural effusion: Secondary | ICD-10-CM | POA: Diagnosis not present

## 2023-10-29 DIAGNOSIS — K223 Perforation of esophagus: Secondary | ICD-10-CM | POA: Diagnosis not present

## 2023-10-29 DIAGNOSIS — G893 Neoplasm related pain (acute) (chronic): Secondary | ICD-10-CM | POA: Diagnosis not present

## 2023-10-29 DIAGNOSIS — Z4801 Encounter for change or removal of surgical wound dressing: Secondary | ICD-10-CM | POA: Diagnosis not present

## 2023-10-29 DIAGNOSIS — I69391 Dysphagia following cerebral infarction: Secondary | ICD-10-CM | POA: Diagnosis not present

## 2023-10-29 DIAGNOSIS — D630 Anemia in neoplastic disease: Secondary | ICD-10-CM | POA: Diagnosis not present

## 2023-10-29 DIAGNOSIS — I10 Essential (primary) hypertension: Secondary | ICD-10-CM | POA: Diagnosis not present

## 2023-10-29 DIAGNOSIS — C799 Secondary malignant neoplasm of unspecified site: Secondary | ICD-10-CM | POA: Diagnosis not present

## 2023-10-29 DIAGNOSIS — Z4803 Encounter for change or removal of drains: Secondary | ICD-10-CM | POA: Diagnosis not present

## 2023-10-29 DIAGNOSIS — D61818 Other pancytopenia: Secondary | ICD-10-CM | POA: Diagnosis not present

## 2023-10-29 DIAGNOSIS — Z86718 Personal history of other venous thrombosis and embolism: Secondary | ICD-10-CM | POA: Diagnosis not present

## 2023-10-29 DIAGNOSIS — E1142 Type 2 diabetes mellitus with diabetic polyneuropathy: Secondary | ICD-10-CM | POA: Diagnosis not present

## 2023-10-29 DIAGNOSIS — Z7901 Long term (current) use of anticoagulants: Secondary | ICD-10-CM | POA: Diagnosis not present

## 2023-10-29 DIAGNOSIS — T451X5D Adverse effect of antineoplastic and immunosuppressive drugs, subsequent encounter: Secondary | ICD-10-CM | POA: Diagnosis not present

## 2023-10-29 DIAGNOSIS — N390 Urinary tract infection, site not specified: Secondary | ICD-10-CM | POA: Diagnosis not present

## 2023-10-29 DIAGNOSIS — Z48813 Encounter for surgical aftercare following surgery on the respiratory system: Secondary | ICD-10-CM | POA: Diagnosis not present

## 2023-10-29 MED FILL — Fosaprepitant Dimeglumine For IV Infusion 150 MG (Base Eq): INTRAVENOUS | Qty: 5 | Status: AC

## 2023-10-29 NOTE — Telephone Encounter (Signed)
 Greig Gavel nurse with [facility] called and left a message. Asking for orders to continue seeing [facility]. Weekly x 9 weeks until Pleurx removed or husband is comfortable taking care of pleurx catheter. How often does Dr. Lonn want pleurx drained?

## 2023-10-29 NOTE — Telephone Encounter (Signed)
 Called Amy back and given orders for weekly visits to drain pleurx catheter x 9 weeks. She verbalized understanding and will call the office back with drainage amount from pleurx catheter.

## 2023-10-30 ENCOUNTER — Inpatient Hospital Stay: Admitting: Hematology and Oncology

## 2023-10-30 ENCOUNTER — Inpatient Hospital Stay

## 2023-10-30 ENCOUNTER — Encounter: Payer: Self-pay | Admitting: Hematology and Oncology

## 2023-10-30 VITALS — BP 147/77 | HR 50 | Temp 97.6°F | Resp 17

## 2023-10-30 VITALS — BP 145/83 | HR 88 | Temp 98.4°F | Resp 18 | Ht 62.0 in | Wt 163.8 lb

## 2023-10-30 DIAGNOSIS — Z79630 Long term (current) use of alkylating agent: Secondary | ICD-10-CM | POA: Diagnosis not present

## 2023-10-30 DIAGNOSIS — C55 Malignant neoplasm of uterus, part unspecified: Secondary | ICD-10-CM

## 2023-10-30 DIAGNOSIS — D61818 Other pancytopenia: Secondary | ICD-10-CM | POA: Diagnosis not present

## 2023-10-30 DIAGNOSIS — Z5112 Encounter for antineoplastic immunotherapy: Secondary | ICD-10-CM | POA: Diagnosis not present

## 2023-10-30 DIAGNOSIS — G893 Neoplasm related pain (acute) (chronic): Secondary | ICD-10-CM

## 2023-10-30 DIAGNOSIS — C799 Secondary malignant neoplasm of unspecified site: Secondary | ICD-10-CM

## 2023-10-30 DIAGNOSIS — J90 Pleural effusion, not elsewhere classified: Secondary | ICD-10-CM | POA: Diagnosis not present

## 2023-10-30 DIAGNOSIS — Z79633 Long term (current) use of mitotic inhibitor: Secondary | ICD-10-CM | POA: Diagnosis not present

## 2023-10-30 DIAGNOSIS — C786 Secondary malignant neoplasm of retroperitoneum and peritoneum: Secondary | ICD-10-CM | POA: Diagnosis not present

## 2023-10-30 DIAGNOSIS — Z5111 Encounter for antineoplastic chemotherapy: Secondary | ICD-10-CM | POA: Diagnosis not present

## 2023-10-30 DIAGNOSIS — D701 Agranulocytosis secondary to cancer chemotherapy: Secondary | ICD-10-CM | POA: Diagnosis not present

## 2023-10-30 MED ORDER — PALONOSETRON HCL INJECTION 0.25 MG/5ML
0.2500 mg | Freq: Once | INTRAVENOUS | Status: AC
  Administered 2023-10-30: 0.25 mg via INTRAVENOUS
  Filled 2023-10-30: qty 5

## 2023-10-30 MED ORDER — MAGNESIUM OXIDE -MG SUPPLEMENT 400 (240 MG) MG PO TABS: 1.0000 | ORAL_TABLET | Freq: Every day | ORAL | 0 refills | Status: AC

## 2023-10-30 MED ORDER — SODIUM CHLORIDE 0.9% FLUSH
10.0000 mL | INTRAVENOUS | Status: DC | PRN
  Administered 2023-10-30: 10 mL

## 2023-10-30 MED ORDER — FAMOTIDINE IN NACL 20-0.9 MG/50ML-% IV SOLN
20.0000 mg | Freq: Once | INTRAVENOUS | Status: AC
  Administered 2023-10-30: 20 mg via INTRAVENOUS
  Filled 2023-10-30: qty 50

## 2023-10-30 MED ORDER — DEXAMETHASONE SODIUM PHOSPHATE 10 MG/ML IJ SOLN
10.0000 mg | Freq: Once | INTRAMUSCULAR | Status: AC
  Administered 2023-10-30: 10 mg via INTRAVENOUS
  Filled 2023-10-30: qty 1

## 2023-10-30 MED ORDER — HEPARIN SOD (PORK) LOCK FLUSH 100 UNIT/ML IV SOLN
500.0000 [IU] | Freq: Once | INTRAVENOUS | Status: AC | PRN
  Administered 2023-10-30: 500 [IU]

## 2023-10-30 MED ORDER — SODIUM CHLORIDE 0.9 % IV SOLN
297.5000 mg | Freq: Once | INTRAVENOUS | Status: AC
  Administered 2023-10-30: 300 mg via INTRAVENOUS
  Filled 2023-10-30: qty 30

## 2023-10-30 MED ORDER — SODIUM CHLORIDE 0.9 % IV SOLN
150.0000 mg | Freq: Once | INTRAVENOUS | Status: AC
  Administered 2023-10-30: 150 mg via INTRAVENOUS
  Filled 2023-10-30: qty 150

## 2023-10-30 MED ORDER — CETIRIZINE HCL 10 MG/ML IV SOLN
10.0000 mg | Freq: Once | INTRAVENOUS | Status: AC
  Administered 2023-10-30: 10 mg via INTRAVENOUS
  Filled 2023-10-30: qty 1

## 2023-10-30 MED ORDER — SODIUM CHLORIDE 0.9 % IV SOLN: INTRAVENOUS | Status: DC

## 2023-10-30 MED ORDER — SODIUM CHLORIDE 0.9 % IV SOLN
105.0000 mg/m2 | Freq: Once | INTRAVENOUS | Status: AC
  Administered 2023-10-30: 192 mg via INTRAVENOUS
  Filled 2023-10-30: qty 32

## 2023-10-30 MED ORDER — SODIUM CHLORIDE 0.9 % IV SOLN
500.0000 mg | Freq: Once | INTRAVENOUS | Status: AC
  Administered 2023-10-30: 500 mg via INTRAVENOUS
  Filled 2023-10-30: qty 10

## 2023-10-30 NOTE — Assessment & Plan Note (Addendum)
 She has some mild pain after placement of Pleurx catheter but her pain is now stable She will continue to take oxycodone  as needed

## 2023-10-30 NOTE — Assessment & Plan Note (Addendum)
 The patient initially presented in April 2024 with postmenopausal bleeding.  CT imaging revealed stage IV disease with metastatic cancer to the pleural fluid as well as peritoneal metastasis Biopsy review high-grade serous carcinoma, MMR normal, Her2 positive, FISh 3+, MSI stable, High grade serous, PIK3CA pathogenic variant, p53 mutated  She received neoadjuvant chemotherapy with combination of carboplatin , paclitaxel  and trastuzumab  with excellent response to therapy, followed by interval debulking surgery and then adjuvant chemotherapy, completed by November 2024, followed by maintenance trastuzumab , then had disease recurrence February 2025 with malignant pleural effusion and abdominal lymphadenopathy Repeat fluid cytology confirmed HER2 positive disease  CT imaging from April 2025 showed positive response to therapy She will continue treatment as scheduled I plan to repeat imaging study next month

## 2023-10-30 NOTE — Progress Notes (Signed)
 [facility] OFFICE PROGRESS NOTE  Patient Care Team: Clarice Nottingham, MD as PCP - General (Internal Medicine) Alvan Ronal BRAVO, MD (Inactive) as PCP - Cardiology (Cardiology) Devona Darice SAUNDERS, RN as Oncology Nurse Navigator (Oncology)  Assessment & Plan Metastasis from cancer of uterus [facility] The patient initially presented in April 2024 with postmenopausal bleeding.  CT imaging revealed stage IV disease with metastatic cancer to the pleural fluid as well as peritoneal metastasis Biopsy review high-grade serous carcinoma, MMR normal, Her2 positive, FISh 3+, MSI stable, High grade serous, PIK3CA pathogenic variant, p53 mutated  She received neoadjuvant chemotherapy with combination of carboplatin , paclitaxel  and trastuzumab  with excellent response to therapy, followed by interval debulking surgery and then adjuvant chemotherapy, completed by November 2024, followed by maintenance trastuzumab , then had disease recurrence February 2025 with malignant pleural effusion and abdominal lymphadenopathy Repeat fluid cytology confirmed HER2 positive disease  CT imaging from April 2025 showed positive response to therapy She will continue treatment as scheduled I plan to repeat imaging study next month Pancytopenia, acquired [facility]) She has severe pancytopenia due to treatment but relatively asymptomatic We will proceed with treatment  She will return after 4 for July holidays to have her labs repeated; if her hemoglobin is less than 8, she will receive a unit of blood I plan to continue her treatment interval at every 4 weeks Recurrent pleural effusion She had Pleurx catheter replace Examination of the lungs reveal good breath sounds bilaterally She will continue drainage at home with guidance from home health Cancer associated pain She has some mild pain after placement of Pleurx catheter but her pain is now stable She will continue to take oxycodone  as needed  No orders of the defined  types were placed in this encounter.    [name], MD  INTERVAL HISTORY: she returns for treatment follow-up Complications related to previous cycle of chemotherapy included pancytopenia, and intermittent shortness of breath She has mild cough after the procedure to replace her Pleurx catheter The patient denies any recent signs or symptoms of bleeding such as spontaneous epistaxis, hematuria or hematochezia.  PHYSICAL EXAMINATION: ECOG PERFORMANCE STATUS: 2 - Symptomatic, <50% confined to bed  Lab Results  Component Value Date   CAN125 71.6 (H) 04/09/2023   CAN125 211.0 (H) 12/19/2022   CAN125 840.0 (H) 10/17/2022      Latest Ref Rng & Units 10/28/2023    8:00 AM 10/27/2023    1:45 PM 10/02/2023    8:32 AM  CBC  WBC 4.0 - 10.5 K/uL 3.5  3.2  3.7   Hemoglobin 12.0 - 15.0 g/dL 8.2  7.9  9.4   Hematocrit 36.0 - 46.0 % 24.5  24.8  27.5   Platelets 150 - 400 K/uL 103  102  138       Chemistry      Component Value Date/Time   NA 139 10/28/2023 0800   NA 144 05/13/2018 1500   K 3.5 10/28/2023 0800   CL 105 10/28/2023 0800   CO2 26 10/28/2023 0800   BUN 11 10/28/2023 0800   BUN 13 05/13/2018 1500   CREATININE 0.59 10/28/2023 0800   CREATININE 0.55 10/02/2023 0832      Component Value Date/Time   CALCIUM  9.6 10/28/2023 0800   ALKPHOS 50 10/28/2023 0800   AST 12 (L) 10/28/2023 0800   AST 14 (L) 10/02/2023 0832   ALT 7 10/28/2023 0800   ALT 8 10/02/2023 0832   BILITOT 0.5 10/28/2023 0800   BILITOT 0.5  10/02/2023 0832       Vitals:   10/30/23 1045  BP: (!) 145/83  Pulse: 88  Resp: 18  Temp: 98.4 F (36.9 C)  SpO2: 98%   Filed Weights   10/30/23 1045  Weight: 163 lb 12.8 oz (74.3 kg)   Examination of her lungs reveal good breath sounds bilaterally  Other relevant data reviewed during this visit included CBC and CMP

## 2023-10-30 NOTE — Assessment & Plan Note (Addendum)
 She had Pleurx catheter replace Examination of the lungs reveal good breath sounds bilaterally She will continue drainage at home with guidance from home health

## 2023-10-30 NOTE — Assessment & Plan Note (Addendum)
 She has severe pancytopenia due to treatment but relatively asymptomatic We will proceed with treatment  She will return after 4 for July holidays to have her labs repeated; if her hemoglobin is less than 8, she will receive a unit of blood I plan to continue her treatment interval at every 4 weeks

## 2023-10-30 NOTE — Patient Instructions (Signed)
 [facility]  Discharge Instructions: Thank you for choosing [facility] to provide your oncology and hematology care.   If you have a lab appointment with the Cancer Center, please go directly to the Cancer Center and check in at the registration area.   Wear comfortable clothing and clothing appropriate for easy access to any Portacath or PICC line.   We strive to give you quality time with your provider. You may need to reschedule your appointment if you arrive late (15 or more minutes).  Arriving late affects you and other patients whose appointments are after yours.  Also, if you miss three or more appointments without notifying the office, you may be dismissed from the clinic at the provider's discretion.      For prescription refill requests, have your pharmacy contact our office and allow 72 hours for refills to be completed.    Today you received the following chemotherapy and/or immunotherapy agents Jemperli, Paclitaxel, Carboplatin     To help prevent nausea and vomiting after your treatment, we encourage you to take your nausea medication as directed.  BELOW ARE SYMPTOMS THAT SHOULD BE REPORTED IMMEDIATELY: *FEVER GREATER THAN 100.4 F (38 C) OR HIGHER *CHILLS OR SWEATING *NAUSEA AND VOMITING THAT IS NOT CONTROLLED WITH YOUR NAUSEA MEDICATION *UNUSUAL SHORTNESS OF BREATH *UNUSUAL BRUISING OR BLEEDING *URINARY PROBLEMS (pain or burning when urinating, or frequent urination) *BOWEL PROBLEMS (unusual diarrhea, constipation, pain near the anus) TENDERNESS IN MOUTH AND THROAT WITH OR WITHOUT PRESENCE OF ULCERS (sore throat, sores in mouth, or a toothache) UNUSUAL RASH, SWELLING OR PAIN  UNUSUAL VAGINAL DISCHARGE OR ITCHING   Items with * indicate a potential emergency and should be followed up as soon as possible or go to the Emergency Department if any problems should occur.  Please show the CHEMOTHERAPY ALERT  CARD or IMMUNOTHERAPY ALERT CARD at check-in to the Emergency Department and triage nurse.  Should you have questions after your visit or need to cancel or reschedule your appointment, please contact [facility]  Dept: [phone]  and follow the prompts.  Office hours are 8:00 a.m. to 4:30 p.m. Monday - Friday. Please note that voicemails left after 4:00 p.m. may not be returned until the following business day.  We are closed weekends and major holidays. You have access to a nurse at all times for urgent questions. Please call the main number to the clinic Dept: [phone] and follow the prompts.   For any non-urgent questions, you may also contact your provider using MyChart. We now offer e-Visits for anyone 23 and older to request care online for non-urgent symptoms. For details visit [email or web address].   Also download the MyChart app! Go to the app store, search "MyChart", open the app, select [place], and log in with your MyChart username and password.

## 2023-10-31 ENCOUNTER — Other Ambulatory Visit: Payer: Self-pay

## 2023-11-03 DIAGNOSIS — I69391 Dysphagia following cerebral infarction: Secondary | ICD-10-CM | POA: Diagnosis not present

## 2023-11-03 DIAGNOSIS — E440 Moderate protein-calorie malnutrition: Secondary | ICD-10-CM | POA: Diagnosis not present

## 2023-11-03 DIAGNOSIS — Z4803 Encounter for change or removal of drains: Secondary | ICD-10-CM | POA: Diagnosis not present

## 2023-11-03 DIAGNOSIS — J910 Malignant pleural effusion: Secondary | ICD-10-CM | POA: Diagnosis not present

## 2023-11-03 DIAGNOSIS — Z86718 Personal history of other venous thrombosis and embolism: Secondary | ICD-10-CM | POA: Diagnosis not present

## 2023-11-03 DIAGNOSIS — C799 Secondary malignant neoplasm of unspecified site: Secondary | ICD-10-CM | POA: Diagnosis not present

## 2023-11-03 DIAGNOSIS — T451X5D Adverse effect of antineoplastic and immunosuppressive drugs, subsequent encounter: Secondary | ICD-10-CM | POA: Diagnosis not present

## 2023-11-03 DIAGNOSIS — Z48813 Encounter for surgical aftercare following surgery on the respiratory system: Secondary | ICD-10-CM | POA: Diagnosis not present

## 2023-11-03 DIAGNOSIS — I4891 Unspecified atrial fibrillation: Secondary | ICD-10-CM | POA: Diagnosis not present

## 2023-11-03 DIAGNOSIS — D6481 Anemia due to antineoplastic chemotherapy: Secondary | ICD-10-CM | POA: Diagnosis not present

## 2023-11-03 DIAGNOSIS — Z7901 Long term (current) use of anticoagulants: Secondary | ICD-10-CM | POA: Diagnosis not present

## 2023-11-03 DIAGNOSIS — G893 Neoplasm related pain (acute) (chronic): Secondary | ICD-10-CM | POA: Diagnosis not present

## 2023-11-03 DIAGNOSIS — G620 Drug-induced polyneuropathy: Secondary | ICD-10-CM | POA: Diagnosis not present

## 2023-11-03 DIAGNOSIS — E1142 Type 2 diabetes mellitus with diabetic polyneuropathy: Secondary | ICD-10-CM | POA: Diagnosis not present

## 2023-11-03 DIAGNOSIS — I10 Essential (primary) hypertension: Secondary | ICD-10-CM | POA: Diagnosis not present

## 2023-11-03 DIAGNOSIS — D61818 Other pancytopenia: Secondary | ICD-10-CM | POA: Diagnosis not present

## 2023-11-03 DIAGNOSIS — N390 Urinary tract infection, site not specified: Secondary | ICD-10-CM | POA: Diagnosis not present

## 2023-11-03 DIAGNOSIS — K223 Perforation of esophagus: Secondary | ICD-10-CM | POA: Diagnosis not present

## 2023-11-03 DIAGNOSIS — Z4801 Encounter for change or removal of surgical wound dressing: Secondary | ICD-10-CM | POA: Diagnosis not present

## 2023-11-03 DIAGNOSIS — D72829 Elevated white blood cell count, unspecified: Secondary | ICD-10-CM | POA: Diagnosis not present

## 2023-11-03 DIAGNOSIS — D630 Anemia in neoplastic disease: Secondary | ICD-10-CM | POA: Diagnosis not present

## 2023-11-09 ENCOUNTER — Inpatient Hospital Stay: Attending: Gynecologic Oncology

## 2023-11-09 ENCOUNTER — Other Ambulatory Visit: Payer: Self-pay | Admitting: *Deleted

## 2023-11-09 ENCOUNTER — Inpatient Hospital Stay

## 2023-11-09 ENCOUNTER — Other Ambulatory Visit: Payer: Self-pay | Admitting: Hematology and Oncology

## 2023-11-09 VITALS — BP 105/65 | HR 59 | Temp 97.5°F | Resp 16

## 2023-11-09 DIAGNOSIS — D61818 Other pancytopenia: Secondary | ICD-10-CM

## 2023-11-09 DIAGNOSIS — Z5112 Encounter for antineoplastic immunotherapy: Secondary | ICD-10-CM | POA: Insufficient documentation

## 2023-11-09 DIAGNOSIS — C55 Malignant neoplasm of uterus, part unspecified: Secondary | ICD-10-CM

## 2023-11-09 DIAGNOSIS — D539 Nutritional anemia, unspecified: Secondary | ICD-10-CM | POA: Insufficient documentation

## 2023-11-09 DIAGNOSIS — C786 Secondary malignant neoplasm of retroperitoneum and peritoneum: Secondary | ICD-10-CM | POA: Insufficient documentation

## 2023-11-09 DIAGNOSIS — Z1731 Human epidermal growth factor receptor 2 positive status: Secondary | ICD-10-CM | POA: Diagnosis not present

## 2023-11-09 DIAGNOSIS — J910 Malignant pleural effusion: Secondary | ICD-10-CM | POA: Insufficient documentation

## 2023-11-09 DIAGNOSIS — Z79633 Long term (current) use of mitotic inhibitor: Secondary | ICD-10-CM | POA: Insufficient documentation

## 2023-11-09 DIAGNOSIS — Z5111 Encounter for antineoplastic chemotherapy: Secondary | ICD-10-CM | POA: Diagnosis not present

## 2023-11-09 DIAGNOSIS — D701 Agranulocytosis secondary to cancer chemotherapy: Secondary | ICD-10-CM | POA: Diagnosis not present

## 2023-11-09 DIAGNOSIS — Z79630 Long term (current) use of alkylating agent: Secondary | ICD-10-CM | POA: Insufficient documentation

## 2023-11-09 LAB — COMPREHENSIVE METABOLIC PANEL WITH GFR
ALT: 8 U/L (ref 0–44)
AST: 13 U/L — ABNORMAL LOW (ref 15–41)
Albumin: 4 g/dL (ref 3.5–5.0)
Alkaline Phosphatase: 48 U/L (ref 38–126)
Anion gap: 8 (ref 5–15)
BUN: 12 mg/dL (ref 8–23)
CO2: 29 mmol/L (ref 22–32)
Calcium: 9.9 mg/dL (ref 8.9–10.3)
Chloride: 103 mmol/L (ref 98–111)
Creatinine, Ser: 0.59 mg/dL (ref 0.44–1.00)
GFR, Estimated: >60 mL/min (ref >60)
Glucose, Bld: 100 mg/dL — ABNORMAL HIGH (ref 70–99)
Potassium: 3.6 mmol/L (ref 3.5–5.1)
Sodium: 140 mmol/L (ref 135–145)
Total Bilirubin: 0.3 mg/dL (ref 0.0–1.2)
Total Protein: 7.2 g/dL (ref 6.5–8.1)

## 2023-11-09 LAB — CBC WITH DIFFERENTIAL/PLATELET
Abs Immature Granulocytes: 0.01 K/uL (ref 0.00–0.07)
Basophils Absolute: 0 K/uL (ref 0.0–0.1)
Basophils Relative: 0 %
Eosinophils Absolute: 0 K/uL (ref 0.0–0.5)
Eosinophils Relative: 1 %
HCT: 24.1 % — ABNORMAL LOW (ref 36.0–46.0)
Hemoglobin: 8 g/dL — ABNORMAL LOW (ref 12.0–15.0)
Immature Granulocytes: 1 %
Lymphocytes Relative: 72 %
Lymphs Abs: 1.1 K/uL (ref 0.7–4.0)
MCH: 32.9 pg (ref 26.0–34.0)
MCHC: 33.2 g/dL (ref 30.0–36.0)
MCV: 99.2 fL (ref 80.0–100.0)
Monocytes Absolute: 0.1 K/uL (ref 0.1–1.0)
Monocytes Relative: 7 %
Neutro Abs: 0.3 K/uL — CL (ref 1.7–7.7)
Neutrophils Relative %: 19 %
Platelets: 98 K/uL — ABNORMAL LOW (ref 150–400)
RBC: 2.43 MIL/uL — ABNORMAL LOW (ref 3.87–5.11)
RDW: 15.5 % (ref 11.5–15.5)
Smear Review: NORMAL
WBC: 1.5 K/uL — ABNORMAL LOW (ref 4.0–10.5)
nRBC: 0 % (ref 0.0–0.2)

## 2023-11-09 LAB — SAMPLE TO BLOOD BANK

## 2023-11-09 LAB — PREPARE RBC (CROSSMATCH)

## 2023-11-09 MED ORDER — SODIUM CHLORIDE 0.9% FLUSH
10.0000 mL | INTRAVENOUS | Status: AC | PRN
  Administered 2023-11-09: 10 mL

## 2023-11-09 MED ORDER — SODIUM CHLORIDE 0.9% IV SOLUTION
250.0000 mL | INTRAVENOUS | Status: DC
  Administered 2023-11-09: 100 mL via INTRAVENOUS

## 2023-11-09 MED ORDER — SODIUM CHLORIDE 0.9% FLUSH
10.0000 mL | Freq: Once | INTRAVENOUS | Status: AC
Start: 2023-11-09 — End: 2023-11-09
  Administered 2023-11-09: 10 mL

## 2023-11-09 MED ORDER — SODIUM CHLORIDE 0.9% FLUSH: 3.0000 mL | INTRAVENOUS | Status: DC | PRN

## 2023-11-09 MED ORDER — DIPHENHYDRAMINE HCL 25 MG PO CAPS
25.0000 mg | ORAL_CAPSULE | Freq: Once | ORAL | Status: AC
  Administered 2023-11-09: 25 mg via ORAL
  Filled 2023-11-09: qty 1

## 2023-11-09 MED ORDER — ACETAMINOPHEN 325 MG PO TABS: 650.0000 mg | ORAL_TABLET | Freq: Once | ORAL | Status: DC

## 2023-11-09 MED ORDER — HEPARIN SOD (PORK) LOCK FLUSH 100 UNIT/ML IV SOLN
250.0000 [IU] | Freq: Once | INTRAVENOUS | Status: AC
  Administered 2023-11-09: 250 [IU]

## 2023-11-09 MED ORDER — HEPARIN SOD (PORK) LOCK FLUSH 100 UNIT/ML IV SOLN
500.0000 [IU] | Freq: Every day | INTRAVENOUS | Status: AC | PRN
  Administered 2023-11-09: 500 [IU]

## 2023-11-09 NOTE — Progress Notes (Signed)
 CRITICAL VALUE STICKER  CRITICAL VALUE: ANC 0.3  RECEIVER (on-site recipient of call): Rosina   DATE & TIME NOTIFIED: 11/09/23 @ 9:18  MESSENGER (representative from lab): Harlene   MD NOTIFIED: Dr. Jenene via secure chat   TIME OF NOTIFICATION: 9:20

## 2023-11-09 NOTE — Patient Instructions (Signed)

## 2023-11-09 NOTE — Progress Notes (Signed)
 Charted in error.

## 2023-11-10 ENCOUNTER — Other Ambulatory Visit: Payer: Self-pay | Admitting: Hematology and Oncology

## 2023-11-10 ENCOUNTER — Telehealth: Payer: Self-pay

## 2023-11-10 DIAGNOSIS — I69391 Dysphagia following cerebral infarction: Secondary | ICD-10-CM | POA: Diagnosis not present

## 2023-11-10 DIAGNOSIS — D61818 Other pancytopenia: Secondary | ICD-10-CM | POA: Diagnosis not present

## 2023-11-10 DIAGNOSIS — J90 Pleural effusion, not elsewhere classified: Secondary | ICD-10-CM

## 2023-11-10 DIAGNOSIS — D72829 Elevated white blood cell count, unspecified: Secondary | ICD-10-CM | POA: Diagnosis not present

## 2023-11-10 DIAGNOSIS — G893 Neoplasm related pain (acute) (chronic): Secondary | ICD-10-CM | POA: Diagnosis not present

## 2023-11-10 DIAGNOSIS — K223 Perforation of esophagus: Secondary | ICD-10-CM | POA: Diagnosis not present

## 2023-11-10 DIAGNOSIS — I10 Essential (primary) hypertension: Secondary | ICD-10-CM | POA: Diagnosis not present

## 2023-11-10 DIAGNOSIS — G620 Drug-induced polyneuropathy: Secondary | ICD-10-CM | POA: Diagnosis not present

## 2023-11-10 DIAGNOSIS — J910 Malignant pleural effusion: Secondary | ICD-10-CM | POA: Diagnosis not present

## 2023-11-10 DIAGNOSIS — Z7901 Long term (current) use of anticoagulants: Secondary | ICD-10-CM | POA: Diagnosis not present

## 2023-11-10 DIAGNOSIS — Z87440 Personal history of urinary (tract) infections: Secondary | ICD-10-CM | POA: Diagnosis not present

## 2023-11-10 DIAGNOSIS — Z86718 Personal history of other venous thrombosis and embolism: Secondary | ICD-10-CM | POA: Diagnosis not present

## 2023-11-10 DIAGNOSIS — E1142 Type 2 diabetes mellitus with diabetic polyneuropathy: Secondary | ICD-10-CM | POA: Diagnosis not present

## 2023-11-10 DIAGNOSIS — E440 Moderate protein-calorie malnutrition: Secondary | ICD-10-CM | POA: Diagnosis not present

## 2023-11-10 DIAGNOSIS — T451X5D Adverse effect of antineoplastic and immunosuppressive drugs, subsequent encounter: Secondary | ICD-10-CM | POA: Diagnosis not present

## 2023-11-10 DIAGNOSIS — D630 Anemia in neoplastic disease: Secondary | ICD-10-CM | POA: Diagnosis not present

## 2023-11-10 DIAGNOSIS — D6481 Anemia due to antineoplastic chemotherapy: Secondary | ICD-10-CM | POA: Diagnosis not present

## 2023-11-10 DIAGNOSIS — C786 Secondary malignant neoplasm of retroperitoneum and peritoneum: Secondary | ICD-10-CM | POA: Diagnosis not present

## 2023-11-10 DIAGNOSIS — I4891 Unspecified atrial fibrillation: Secondary | ICD-10-CM | POA: Diagnosis not present

## 2023-11-10 DIAGNOSIS — Z4803 Encounter for change or removal of drains: Secondary | ICD-10-CM | POA: Diagnosis not present

## 2023-11-10 LAB — TYPE AND SCREEN
ABO/RH(D): O POS
Antibody Screen: NEGATIVE
Unit division: 0

## 2023-11-10 LAB — BPAM RBC
Blood Product Expiration Date: 202508072359
ISSUE DATE / TIME: 202507071031
Unit Type and Rh: 5100

## 2023-11-10 NOTE — Telephone Encounter (Signed)
 Pt is agreeable to come in for CXR. She knows this is walk=in. Advised pt to go in as early as possible around 0800 so we can get results and make sure tube is patent. She verbalized understanding. Placed call to Greig Gavel, HOME HEALTH RN to make her aware. She appreciated the follow up and asked for us  to let her know results once received. [phone]

## 2023-11-10 NOTE — Telephone Encounter (Signed)
 error

## 2023-11-10 NOTE — Telephone Encounter (Signed)
 Received call from Greig Gavel, RN with [facility] stating she visited pt 7/1 for Pleurx darin empty and measured 300 mL o/p, she revisited pt 11/10/23 and removed 50 mL o/p. She is asking for MD to advise if once weekly visits should change since there was a significant decrease on one week. Currently, pt's plan is once weekly X 9 weeks which started 6/24. Routed to MD to advise

## 2023-11-10 NOTE — Telephone Encounter (Signed)
 We need to bring patient in for repeat CXR The last time it happened the tube malfunctioned Please call the patient to come in tomorrow for CXR, I will place order for CXR and call her with result

## 2023-11-11 ENCOUNTER — Ambulatory Visit (HOSPITAL_COMMUNITY)
Admission: RE | Admit: 2023-11-11 | Discharge: 2023-11-11 | Disposition: A | Discharge status: Home / Self Care | Source: Ambulatory Visit | Attending: Hematology and Oncology | Admitting: Hematology and Oncology

## 2023-11-11 ENCOUNTER — Encounter: Payer: Self-pay | Admitting: Hematology and Oncology

## 2023-11-11 ENCOUNTER — Telehealth: Payer: Self-pay | Admitting: *Deleted

## 2023-11-11 DIAGNOSIS — Z4682 Encounter for fitting and adjustment of non-vascular catheter: Secondary | ICD-10-CM | POA: Diagnosis not present

## 2023-11-11 DIAGNOSIS — J90 Pleural effusion, not elsewhere classified: Secondary | ICD-10-CM | POA: Insufficient documentation

## 2023-11-11 DIAGNOSIS — J910 Malignant pleural effusion: Secondary | ICD-10-CM | POA: Diagnosis not present

## 2023-11-11 NOTE — Telephone Encounter (Signed)
 Maria Avila had CXR this AM as ordered due to concerns from Home Health cal reporting minimal drainage.    Per Dr. Lonn request, contacted patient/spouse to inform them that she had reviewed the CXR and it looked better. She can see some fluids but not as bad as before. She is changing the home health order to drain every 2 weeks - home health to skip drainage next week. Home Health RN will also contact them. Advised them to contact office and/or home health RN for changes or questions. Maria Avila verbalized understanding.  Contacted Amy Graves, RN with [facility], [phone] with update per Dr. Lonn on CXR and new order change drainage order to every 2 weeks - skip drainage next week. Amy verbalized understanding and states she will change schedule and not provide drainage next week.

## 2023-11-18 DIAGNOSIS — G620 Drug-induced polyneuropathy: Secondary | ICD-10-CM | POA: Diagnosis not present

## 2023-11-18 DIAGNOSIS — J910 Malignant pleural effusion: Secondary | ICD-10-CM | POA: Diagnosis not present

## 2023-11-18 DIAGNOSIS — G893 Neoplasm related pain (acute) (chronic): Secondary | ICD-10-CM | POA: Diagnosis not present

## 2023-11-18 DIAGNOSIS — K223 Perforation of esophagus: Secondary | ICD-10-CM | POA: Diagnosis not present

## 2023-11-18 DIAGNOSIS — C786 Secondary malignant neoplasm of retroperitoneum and peritoneum: Secondary | ICD-10-CM | POA: Diagnosis not present

## 2023-11-18 DIAGNOSIS — E1142 Type 2 diabetes mellitus with diabetic polyneuropathy: Secondary | ICD-10-CM | POA: Diagnosis not present

## 2023-11-18 DIAGNOSIS — Z87440 Personal history of urinary (tract) infections: Secondary | ICD-10-CM | POA: Diagnosis not present

## 2023-11-18 DIAGNOSIS — D630 Anemia in neoplastic disease: Secondary | ICD-10-CM | POA: Diagnosis not present

## 2023-11-18 DIAGNOSIS — I69391 Dysphagia following cerebral infarction: Secondary | ICD-10-CM | POA: Diagnosis not present

## 2023-11-18 DIAGNOSIS — Z86718 Personal history of other venous thrombosis and embolism: Secondary | ICD-10-CM | POA: Diagnosis not present

## 2023-11-18 DIAGNOSIS — Z4803 Encounter for change or removal of drains: Secondary | ICD-10-CM | POA: Diagnosis not present

## 2023-11-18 DIAGNOSIS — T451X5D Adverse effect of antineoplastic and immunosuppressive drugs, subsequent encounter: Secondary | ICD-10-CM | POA: Diagnosis not present

## 2023-11-18 DIAGNOSIS — I4891 Unspecified atrial fibrillation: Secondary | ICD-10-CM | POA: Diagnosis not present

## 2023-11-18 DIAGNOSIS — D61818 Other pancytopenia: Secondary | ICD-10-CM | POA: Diagnosis not present

## 2023-11-18 DIAGNOSIS — D6481 Anemia due to antineoplastic chemotherapy: Secondary | ICD-10-CM | POA: Diagnosis not present

## 2023-11-18 DIAGNOSIS — D72829 Elevated white blood cell count, unspecified: Secondary | ICD-10-CM | POA: Diagnosis not present

## 2023-11-18 DIAGNOSIS — E440 Moderate protein-calorie malnutrition: Secondary | ICD-10-CM | POA: Diagnosis not present

## 2023-11-18 DIAGNOSIS — Z7901 Long term (current) use of anticoagulants: Secondary | ICD-10-CM | POA: Diagnosis not present

## 2023-11-18 DIAGNOSIS — I10 Essential (primary) hypertension: Secondary | ICD-10-CM | POA: Diagnosis not present

## 2023-11-23 ENCOUNTER — Other Ambulatory Visit (HOSPITAL_COMMUNITY): Payer: Self-pay

## 2023-11-23 ENCOUNTER — Other Ambulatory Visit: Payer: Self-pay | Admitting: Hematology and Oncology

## 2023-11-23 ENCOUNTER — Telehealth: Payer: Self-pay | Admitting: *Deleted

## 2023-11-23 ENCOUNTER — Inpatient Hospital Stay: Admitting: Hematology and Oncology

## 2023-11-23 ENCOUNTER — Ambulatory Visit (HOSPITAL_COMMUNITY)
Admission: RE | Admit: 2023-11-23 | Discharge: 2023-11-23 | Disposition: A | Discharge status: Home / Self Care | Source: Ambulatory Visit | Attending: Hematology and Oncology | Admitting: Hematology and Oncology

## 2023-11-23 VITALS — BP 146/80 | HR 82 | Temp 98.1°F | Resp 18 | Ht 62.0 in | Wt 161.4 lb

## 2023-11-23 DIAGNOSIS — D701 Agranulocytosis secondary to cancer chemotherapy: Secondary | ICD-10-CM | POA: Diagnosis not present

## 2023-11-23 DIAGNOSIS — D539 Nutritional anemia, unspecified: Secondary | ICD-10-CM | POA: Diagnosis not present

## 2023-11-23 DIAGNOSIS — D61818 Other pancytopenia: Secondary | ICD-10-CM | POA: Diagnosis not present

## 2023-11-23 DIAGNOSIS — G893 Neoplasm related pain (acute) (chronic): Secondary | ICD-10-CM | POA: Diagnosis not present

## 2023-11-23 DIAGNOSIS — I4891 Unspecified atrial fibrillation: Secondary | ICD-10-CM | POA: Diagnosis not present

## 2023-11-23 DIAGNOSIS — C55 Malignant neoplasm of uterus, part unspecified: Secondary | ICD-10-CM | POA: Diagnosis present

## 2023-11-23 DIAGNOSIS — Z86718 Personal history of other venous thrombosis and embolism: Secondary | ICD-10-CM | POA: Diagnosis not present

## 2023-11-23 DIAGNOSIS — Z1731 Human epidermal growth factor receptor 2 positive status: Secondary | ICD-10-CM | POA: Diagnosis not present

## 2023-11-23 DIAGNOSIS — D630 Anemia in neoplastic disease: Secondary | ICD-10-CM | POA: Diagnosis not present

## 2023-11-23 DIAGNOSIS — Z79630 Long term (current) use of alkylating agent: Secondary | ICD-10-CM | POA: Diagnosis not present

## 2023-11-23 DIAGNOSIS — Z7901 Long term (current) use of anticoagulants: Secondary | ICD-10-CM | POA: Diagnosis not present

## 2023-11-23 DIAGNOSIS — Z87440 Personal history of urinary (tract) infections: Secondary | ICD-10-CM | POA: Diagnosis not present

## 2023-11-23 DIAGNOSIS — Z4803 Encounter for change or removal of drains: Secondary | ICD-10-CM | POA: Diagnosis not present

## 2023-11-23 DIAGNOSIS — E1142 Type 2 diabetes mellitus with diabetic polyneuropathy: Secondary | ICD-10-CM | POA: Diagnosis not present

## 2023-11-23 DIAGNOSIS — D72829 Elevated white blood cell count, unspecified: Secondary | ICD-10-CM | POA: Diagnosis not present

## 2023-11-23 DIAGNOSIS — C786 Secondary malignant neoplasm of retroperitoneum and peritoneum: Secondary | ICD-10-CM | POA: Diagnosis not present

## 2023-11-23 DIAGNOSIS — I10 Essential (primary) hypertension: Secondary | ICD-10-CM | POA: Diagnosis not present

## 2023-11-23 DIAGNOSIS — K223 Perforation of esophagus: Secondary | ICD-10-CM | POA: Diagnosis not present

## 2023-11-23 DIAGNOSIS — I69391 Dysphagia following cerebral infarction: Secondary | ICD-10-CM | POA: Diagnosis not present

## 2023-11-23 DIAGNOSIS — E440 Moderate protein-calorie malnutrition: Secondary | ICD-10-CM | POA: Diagnosis not present

## 2023-11-23 DIAGNOSIS — Z5111 Encounter for antineoplastic chemotherapy: Secondary | ICD-10-CM | POA: Diagnosis not present

## 2023-11-23 DIAGNOSIS — J910 Malignant pleural effusion: Secondary | ICD-10-CM | POA: Diagnosis not present

## 2023-11-23 DIAGNOSIS — Z4682 Encounter for fitting and adjustment of non-vascular catheter: Secondary | ICD-10-CM | POA: Diagnosis not present

## 2023-11-23 DIAGNOSIS — D6481 Anemia due to antineoplastic chemotherapy: Secondary | ICD-10-CM | POA: Diagnosis not present

## 2023-11-23 DIAGNOSIS — T451X5D Adverse effect of antineoplastic and immunosuppressive drugs, subsequent encounter: Secondary | ICD-10-CM | POA: Diagnosis not present

## 2023-11-23 DIAGNOSIS — G620 Drug-induced polyneuropathy: Secondary | ICD-10-CM | POA: Diagnosis not present

## 2023-11-23 DIAGNOSIS — J90 Pleural effusion, not elsewhere classified: Secondary | ICD-10-CM | POA: Insufficient documentation

## 2023-11-23 DIAGNOSIS — J9811 Atelectasis: Secondary | ICD-10-CM | POA: Diagnosis not present

## 2023-11-23 DIAGNOSIS — Z79633 Long term (current) use of mitotic inhibitor: Secondary | ICD-10-CM | POA: Diagnosis not present

## 2023-11-23 DIAGNOSIS — C799 Secondary malignant neoplasm of unspecified site: Secondary | ICD-10-CM | POA: Insufficient documentation

## 2023-11-23 DIAGNOSIS — Z5112 Encounter for antineoplastic immunotherapy: Secondary | ICD-10-CM | POA: Diagnosis not present

## 2023-11-23 MED ORDER — OXYCODONE HCL 5 MG PO TABS
5.0000 mg | ORAL_TABLET | Freq: Four times a day (QID) | ORAL | 0 refills | Status: DC | PRN
  Filled 2023-11-23: qty 90, 23d supply, fill #0

## 2023-11-23 NOTE — Progress Notes (Signed)
 [facility] OFFICE PROGRESS NOTE  Patient Care Team: Maria Nottingham, MD as PCP - General (Internal Medicine) Maria Ronal BRAVO, MD (Inactive) as PCP - Cardiology (Cardiology) Maria Darice SAUNDERS, RN as Oncology Nurse Navigator (Oncology)  Assessment & Plan Metastasis from cancer of uterus [facility]) The patient initially presented in April 2024 with postmenopausal bleeding.  CT imaging revealed stage IV disease with metastatic cancer to the pleural fluid as well as peritoneal metastasis Biopsy review high-grade serous carcinoma, MMR normal, Her2 positive, FISh 3+, MSI stable, High grade serous, PIK3CA pathogenic variant, p53 mutated  She received neoadjuvant chemotherapy with combination of carboplatin , paclitaxel  and trastuzumab  with excellent response to therapy, followed by interval debulking surgery and then adjuvant chemotherapy, completed by November 2024, followed by maintenance trastuzumab , then had disease recurrence February 2025 with malignant pleural effusion and abdominal lymphadenopathy Repeat fluid cytology confirmed HER2 positive disease  CT imaging from April 2025 showed positive response to therapy She will continue treatment as scheduled I plan to repeat imaging study next month Recurrent pleural effusion She had Pleurx catheter replace Examination of the lungs reveal good breath sounds bilaterally Chest x-ray from today was reviewed which show Pleurx catheter in position with minimal pleural fluid She likely have good response to chemotherapy As above, I plan to repeat imaging study next month Cancer associated pain She has some mild pain due to presence of Pleurx catheter  she will continue to take oxycodone  as needed I sent refill today  Orders Placed This Encounter  Procedures   CT CHEST ABDOMEN PELVIS W CONTRAST    Standing Status:   Future    Expected Date:   12/04/2023    Expiration Date:   11/22/2024    If indicated for the ordered procedure, I authorize the  administration of contrast media per Radiology protocol:   Yes    Does the patient have a contrast media/X-ray dye allergy?:   No    Preferred imaging location?:   [facility]    If indicated for the ordered procedure, I authorize the administration of oral contrast media per Radiology protocol:   No    Reason for no oral contrast::   no need oral contrast     Maria Bedford, MD  INTERVAL HISTORY: she returns for treatment follow-up Complications related to previous cycle of chemotherapy included pancytopenia,, cancer associated pain,, and concerned that her Pleurx catheter is not draining fluid  PHYSICAL EXAMINATION: ECOG PERFORMANCE STATUS: Szo and then usual1 - Symptomatic but completely ambulatory  GENERAL:alert, no distress and comfortable LUNGS: Normal breathing effort, Pleurx catheter in the right lung base, mild crackles on exam on the right lung base  Lab Results  Component Value Date   CAN125 71.6 (H) 04/09/2023   CAN125 211.0 (H) 12/19/2022   CAN125 840.0 (H) 10/17/2022      Latest Ref Rng & Units 11/09/2023    7:54 AM 10/28/2023    8:00 AM 10/27/2023    1:45 PM  CBC  WBC 4.0 - 10.5 K/uL 1.5  3.5  3.2   Hemoglobin 12.0 - 15.0 g/dL 8.0  8.2  7.9   Hematocrit 36.0 - 46.0 % 24.1  24.5  24.8   Platelets 150 - 400 K/uL 98  103  102       Chemistry      Component Value Date/Time   NA 140 11/09/2023 0754   NA 144 05/13/2018 1500   K 3.6 11/09/2023 0754   CL 103 11/09/2023 0754  CO2 29 11/09/2023 0754   BUN 12 11/09/2023 0754   BUN 13 05/13/2018 1500   CREATININE 0.59 11/09/2023 0754   CREATININE 0.55 10/02/2023 0832      Component Value Date/Time   CALCIUM  9.9 11/09/2023 0754   ALKPHOS 48 11/09/2023 0754   AST 13 (L) 11/09/2023 0754   AST 14 (L) 10/02/2023 0832   ALT 8 11/09/2023 0754   ALT 8 10/02/2023 0832   BILITOT 0.3 11/09/2023 0754   BILITOT 0.5 10/02/2023 9167     Other relevant data reviewed during this visit included that and then the  information that reviewed today chest x-ray

## 2023-11-23 NOTE — Telephone Encounter (Signed)
 Per Dr Lonn communicated to Amy with [facility] that no further drainings are needed.  Please continue to perform dressing changes.

## 2023-11-23 NOTE — Assessment & Plan Note (Addendum)
 She has some mild pain due to presence of Pleurx catheter  she will continue to take oxycodone  as needed I sent refill today

## 2023-11-23 NOTE — Telephone Encounter (Signed)
 Received call from Amy RN with [facility] [phone].  Patient was originally scheduled to go out and drain pleurex on 11/24/2023 however, husband called asking for visit sooner. Patient complains of pain on side and at insertion site.  Fullness of upper abdomen, dry cough and slight increase in shortness of breath.  Nurse assessed and found crackles to lower right lobe.   She attempted to drain pleurex and maybe got 2 mls of serosanguineous fluid.  She knows that we checked placement on 11/11/2023 but needs to advise how to proceed.  Patient also needs Oxycodone  5 mg refilled to [facility] on [address].  Routed to Dr Lonn, please advise.

## 2023-11-23 NOTE — Telephone Encounter (Signed)
 Can you call the patient and ask them to go to [facility] now for CXR? The CXR is ordered, no need prior auth I will see her at as add on after CXR Please add to my schedule at 1245 I will refill her medications after I see her

## 2023-11-23 NOTE — Assessment & Plan Note (Addendum)
 The patient initially presented in April 2024 with postmenopausal bleeding.  CT imaging revealed stage IV disease with metastatic cancer to the pleural fluid as well as peritoneal metastasis Biopsy review high-grade serous carcinoma, MMR normal, Her2 positive, FISh 3+, MSI stable, High grade serous, PIK3CA pathogenic variant, p53 mutated  She received neoadjuvant chemotherapy with combination of carboplatin , paclitaxel  and trastuzumab  with excellent response to therapy, followed by interval debulking surgery and then adjuvant chemotherapy, completed by November 2024, followed by maintenance trastuzumab , then had disease recurrence February 2025 with malignant pleural effusion and abdominal lymphadenopathy Repeat fluid cytology confirmed HER2 positive disease  CT imaging from April 2025 showed positive response to therapy She will continue treatment as scheduled I plan to repeat imaging study next month

## 2023-11-23 NOTE — Assessment & Plan Note (Addendum)
 She had Pleurx catheter replace Examination of the lungs reveal good breath sounds bilaterally Chest x-ray from today was reviewed which show Pleurx catheter in position with minimal pleural fluid She likely have good response to chemotherapy As above, I plan to repeat imaging study next month

## 2023-11-23 NOTE — Assessment & Plan Note (Signed)
 She has severe pancytopenia due to treatment but relatively asymptomatic We will proceed with treatment

## 2023-11-23 NOTE — Telephone Encounter (Signed)
 Spoke with patient and spouse and will do there best to get xray done prior to 1245 visit today.

## 2023-11-24 ENCOUNTER — Other Ambulatory Visit: Payer: Self-pay

## 2023-11-26 MED FILL — Fosaprepitant Dimeglumine For IV Infusion 150 MG (Base Eq): INTRAVENOUS | Qty: 5 | Status: AC

## 2023-11-27 ENCOUNTER — Inpatient Hospital Stay

## 2023-11-27 ENCOUNTER — Ambulatory Visit: Admitting: Hematology and Oncology

## 2023-11-27 VITALS — BP 125/71 | HR 77 | Temp 98.1°F | Resp 16 | Wt 157.0 lb

## 2023-11-27 DIAGNOSIS — Z5111 Encounter for antineoplastic chemotherapy: Secondary | ICD-10-CM | POA: Diagnosis not present

## 2023-11-27 DIAGNOSIS — C799 Secondary malignant neoplasm of unspecified site: Secondary | ICD-10-CM

## 2023-11-27 DIAGNOSIS — J910 Malignant pleural effusion: Secondary | ICD-10-CM | POA: Diagnosis not present

## 2023-11-27 DIAGNOSIS — Z1731 Human epidermal growth factor receptor 2 positive status: Secondary | ICD-10-CM | POA: Diagnosis not present

## 2023-11-27 DIAGNOSIS — Z79630 Long term (current) use of alkylating agent: Secondary | ICD-10-CM | POA: Diagnosis not present

## 2023-11-27 DIAGNOSIS — D539 Nutritional anemia, unspecified: Secondary | ICD-10-CM | POA: Diagnosis not present

## 2023-11-27 DIAGNOSIS — Z5112 Encounter for antineoplastic immunotherapy: Secondary | ICD-10-CM | POA: Diagnosis not present

## 2023-11-27 DIAGNOSIS — Z79633 Long term (current) use of mitotic inhibitor: Secondary | ICD-10-CM | POA: Diagnosis not present

## 2023-11-27 DIAGNOSIS — C786 Secondary malignant neoplasm of retroperitoneum and peritoneum: Secondary | ICD-10-CM | POA: Diagnosis not present

## 2023-11-27 DIAGNOSIS — D701 Agranulocytosis secondary to cancer chemotherapy: Secondary | ICD-10-CM | POA: Diagnosis not present

## 2023-11-27 DIAGNOSIS — D61818 Other pancytopenia: Secondary | ICD-10-CM

## 2023-11-27 LAB — CBC WITH DIFFERENTIAL (CANCER CENTER ONLY)
Abs Immature Granulocytes: 0.01 K/uL (ref 0.00–0.07)
Basophils Absolute: 0 K/uL (ref 0.0–0.1)
Basophils Relative: 0 %
Eosinophils Absolute: 0 K/uL (ref 0.0–0.5)
Eosinophils Relative: 0 %
HCT: 30.6 % — ABNORMAL LOW (ref 36.0–46.0)
Hemoglobin: 10.5 g/dL — ABNORMAL LOW (ref 12.0–15.0)
Immature Granulocytes: 0 %
Lymphocytes Relative: 16 %
Lymphs Abs: 0.6 K/uL — ABNORMAL LOW (ref 0.7–4.0)
MCH: 33.2 pg (ref 26.0–34.0)
MCHC: 34.3 g/dL (ref 30.0–36.0)
MCV: 96.8 fL (ref 80.0–100.0)
Monocytes Absolute: 0.1 K/uL (ref 0.1–1.0)
Monocytes Relative: 2 %
Neutro Abs: 3.4 K/uL (ref 1.7–7.7)
Neutrophils Relative %: 82 %
Platelet Count: 120 K/uL — ABNORMAL LOW (ref 150–400)
RBC: 3.16 MIL/uL — ABNORMAL LOW (ref 3.87–5.11)
RDW: 14.6 % (ref 11.5–15.5)
WBC Count: 4.1 K/uL (ref 4.0–10.5)
nRBC: 0 % (ref 0.0–0.2)

## 2023-11-27 LAB — CMP (CANCER CENTER ONLY)
ALT: 7 U/L (ref 0–44)
AST: 13 U/L — ABNORMAL LOW (ref 15–41)
Albumin: 4.2 g/dL (ref 3.5–5.0)
Alkaline Phosphatase: 57 U/L (ref 38–126)
Anion gap: 9 (ref 5–15)
BUN: 17 mg/dL (ref 8–23)
CO2: 28 mmol/L (ref 22–32)
Calcium: 10.2 mg/dL (ref 8.9–10.3)
Chloride: 102 mmol/L (ref 98–111)
Creatinine: 0.63 mg/dL (ref 0.44–1.00)
GFR, Estimated: >60 mL/min (ref >60)
Glucose, Bld: 156 mg/dL — ABNORMAL HIGH (ref 70–99)
Potassium: 3.5 mmol/L (ref 3.5–5.1)
Sodium: 139 mmol/L (ref 135–145)
Total Bilirubin: 0.4 mg/dL (ref 0.0–1.2)
Total Protein: 8.1 g/dL (ref 6.5–8.1)

## 2023-11-27 LAB — TSH: TSH: 0.514 u[IU]/mL (ref 0.350–4.500)

## 2023-11-27 LAB — SAMPLE TO BLOOD BANK

## 2023-11-27 MED ORDER — SODIUM CHLORIDE 0.9 % IV SOLN
500.0000 mg | Freq: Once | INTRAVENOUS | Status: AC
  Administered 2023-11-27: 500 mg via INTRAVENOUS
  Filled 2023-11-27: qty 10

## 2023-11-27 MED ORDER — FOSAPREPITANT DIMEGLUMINE INJECTION 150 MG
150.0000 mg | Freq: Once | INTRAVENOUS | Status: AC
  Administered 2023-11-27: 150 mg via INTRAVENOUS
  Filled 2023-11-27: qty 150

## 2023-11-27 MED ORDER — SODIUM CHLORIDE 0.9 % IV SOLN
105.0000 mg/m2 | Freq: Once | INTRAVENOUS | Status: AC
  Administered 2023-11-27: 192 mg via INTRAVENOUS
  Filled 2023-11-27: qty 32

## 2023-11-27 MED ORDER — CETIRIZINE HCL 10 MG/ML IV SOLN
10.0000 mg | Freq: Once | INTRAVENOUS | Status: AC
  Administered 2023-11-27: 10 mg via INTRAVENOUS
  Filled 2023-11-27: qty 1

## 2023-11-27 MED ORDER — SODIUM CHLORIDE 0.9 % IV SOLN
297.5000 mg | Freq: Once | INTRAVENOUS | Status: AC
  Administered 2023-11-27: 300 mg via INTRAVENOUS
  Filled 2023-11-27: qty 30

## 2023-11-27 MED ORDER — SODIUM CHLORIDE 0.9% FLUSH
10.0000 mL | INTRAVENOUS | Status: DC | PRN
  Administered 2023-11-27: 10 mL

## 2023-11-27 MED ORDER — SODIUM CHLORIDE 0.9% FLUSH
10.0000 mL | Freq: Once | INTRAVENOUS | Status: AC
Start: 2023-11-27 — End: 2023-11-27
  Administered 2023-11-27: 10 mL

## 2023-11-27 MED ORDER — PALONOSETRON HCL INJECTION 0.25 MG/5ML
0.2500 mg | Freq: Once | INTRAVENOUS | Status: AC
  Administered 2023-11-27: 0.25 mg via INTRAVENOUS
  Filled 2023-11-27: qty 5

## 2023-11-27 MED ORDER — SODIUM CHLORIDE 0.9 % IV SOLN: INTRAVENOUS | Status: DC

## 2023-11-27 MED ORDER — DEXAMETHASONE SODIUM PHOSPHATE 10 MG/ML IJ SOLN
10.0000 mg | Freq: Once | INTRAMUSCULAR | Status: AC
  Administered 2023-11-27: 10 mg via INTRAVENOUS
  Filled 2023-11-27: qty 1

## 2023-11-27 MED ORDER — HEPARIN SOD (PORK) LOCK FLUSH 100 UNIT/ML IV SOLN
500.0000 [IU] | Freq: Once | INTRAVENOUS | Status: AC | PRN
  Administered 2023-11-27: 500 [IU]

## 2023-11-27 MED ORDER — FAMOTIDINE IN NACL 20-0.9 MG/50ML-% IV SOLN
20.0000 mg | Freq: Once | INTRAVENOUS | Status: AC
  Administered 2023-11-27: 20 mg via INTRAVENOUS
  Filled 2023-11-27: qty 50

## 2023-11-27 NOTE — Patient Instructions (Signed)
 [facility]  Discharge Instructions: Thank you for choosing [facility] to provide your oncology and hematology care.   If you have a lab appointment with the Cancer Center, please go directly to the Cancer Center and check in at the registration area.   Wear comfortable clothing and clothing appropriate for easy access to any Portacath or PICC line.   We strive to give you quality time with your provider. You may need to reschedule your appointment if you arrive late (15 or more minutes).  Arriving late affects you and other patients whose appointments are after yours.  Also, if you miss three or more appointments without notifying the office, you may be dismissed from the clinic at the provider's discretion.      For prescription refill requests, have your pharmacy contact our office and allow 72 hours for refills to be completed.    Today you received the following chemotherapy and/or immunotherapy agents: dostarlimab, paclitaxel, and carboplatin      To help prevent nausea and vomiting after your treatment, we encourage you to take your nausea medication as directed.  BELOW ARE SYMPTOMS THAT SHOULD BE REPORTED IMMEDIATELY: *FEVER GREATER THAN 100.4 F (38 C) OR HIGHER *CHILLS OR SWEATING *NAUSEA AND VOMITING THAT IS NOT CONTROLLED WITH YOUR NAUSEA MEDICATION *UNUSUAL SHORTNESS OF BREATH *UNUSUAL BRUISING OR BLEEDING *URINARY PROBLEMS (pain or burning when urinating, or frequent urination) *BOWEL PROBLEMS (unusual diarrhea, constipation, pain near the anus) TENDERNESS IN MOUTH AND THROAT WITH OR WITHOUT PRESENCE OF ULCERS (sore throat, sores in mouth, or a toothache) UNUSUAL RASH, SWELLING OR PAIN  UNUSUAL VAGINAL DISCHARGE OR ITCHING   Items with * indicate a potential emergency and should be followed up as soon as possible or go to the Emergency Department if any problems should occur.  Please show the  CHEMOTHERAPY ALERT CARD or IMMUNOTHERAPY ALERT CARD at check-in to the Emergency Department and triage nurse.  Should you have questions after your visit or need to cancel or reschedule your appointment, please contact [facility]  Dept: [phone]  and follow the prompts.  Office hours are 8:00 a.m. to 4:30 p.m. Monday - Friday. Please note that voicemails left after 4:00 p.m. may not be returned until the following business day.  We are closed weekends and major holidays. You have access to a nurse at all times for urgent questions. Please call the main number to the clinic Dept: [phone] and follow the prompts.   For any non-urgent questions, you may also contact your provider using MyChart. We now offer e-Visits for anyone 33 and older to request care online for non-urgent symptoms. For details visit [email or web address].   Also download the MyChart app! Go to the app store, search "MyChart", open the app, select [place], and log in with your MyChart username and password.

## 2023-11-28 LAB — T4: T4, Total: 9.4 ug/dL (ref 4.5–12.0)

## 2023-12-01 DIAGNOSIS — D6481 Anemia due to antineoplastic chemotherapy: Secondary | ICD-10-CM | POA: Diagnosis not present

## 2023-12-01 DIAGNOSIS — E1142 Type 2 diabetes mellitus with diabetic polyneuropathy: Secondary | ICD-10-CM | POA: Diagnosis not present

## 2023-12-01 DIAGNOSIS — G893 Neoplasm related pain (acute) (chronic): Secondary | ICD-10-CM | POA: Diagnosis not present

## 2023-12-01 DIAGNOSIS — I10 Essential (primary) hypertension: Secondary | ICD-10-CM | POA: Diagnosis not present

## 2023-12-01 DIAGNOSIS — G620 Drug-induced polyneuropathy: Secondary | ICD-10-CM | POA: Diagnosis not present

## 2023-12-01 DIAGNOSIS — D72829 Elevated white blood cell count, unspecified: Secondary | ICD-10-CM | POA: Diagnosis not present

## 2023-12-01 DIAGNOSIS — Z4803 Encounter for change or removal of drains: Secondary | ICD-10-CM | POA: Diagnosis not present

## 2023-12-01 DIAGNOSIS — T451X5D Adverse effect of antineoplastic and immunosuppressive drugs, subsequent encounter: Secondary | ICD-10-CM | POA: Diagnosis not present

## 2023-12-01 DIAGNOSIS — D630 Anemia in neoplastic disease: Secondary | ICD-10-CM | POA: Diagnosis not present

## 2023-12-01 DIAGNOSIS — D61818 Other pancytopenia: Secondary | ICD-10-CM | POA: Diagnosis not present

## 2023-12-01 DIAGNOSIS — I4891 Unspecified atrial fibrillation: Secondary | ICD-10-CM | POA: Diagnosis not present

## 2023-12-01 DIAGNOSIS — J910 Malignant pleural effusion: Secondary | ICD-10-CM | POA: Diagnosis not present

## 2023-12-01 DIAGNOSIS — Z87440 Personal history of urinary (tract) infections: Secondary | ICD-10-CM | POA: Diagnosis not present

## 2023-12-01 DIAGNOSIS — E440 Moderate protein-calorie malnutrition: Secondary | ICD-10-CM | POA: Diagnosis not present

## 2023-12-01 DIAGNOSIS — Z86718 Personal history of other venous thrombosis and embolism: Secondary | ICD-10-CM | POA: Diagnosis not present

## 2023-12-01 DIAGNOSIS — C786 Secondary malignant neoplasm of retroperitoneum and peritoneum: Secondary | ICD-10-CM | POA: Diagnosis not present

## 2023-12-01 DIAGNOSIS — K223 Perforation of esophagus: Secondary | ICD-10-CM | POA: Diagnosis not present

## 2023-12-01 DIAGNOSIS — I69391 Dysphagia following cerebral infarction: Secondary | ICD-10-CM | POA: Diagnosis not present

## 2023-12-01 DIAGNOSIS — Z7901 Long term (current) use of anticoagulants: Secondary | ICD-10-CM | POA: Diagnosis not present

## 2023-12-04 ENCOUNTER — Telehealth: Payer: Self-pay

## 2023-12-04 ENCOUNTER — Ambulatory Visit (HOSPITAL_COMMUNITY)
Admission: RE | Admit: 2023-12-04 | Discharge: 2023-12-04 | Disposition: A | Discharge status: Home / Self Care | Source: Ambulatory Visit | Attending: Hematology and Oncology | Admitting: Hematology and Oncology

## 2023-12-04 DIAGNOSIS — I700 Atherosclerosis of aorta: Secondary | ICD-10-CM | POA: Diagnosis not present

## 2023-12-04 DIAGNOSIS — J90 Pleural effusion, not elsewhere classified: Secondary | ICD-10-CM | POA: Diagnosis not present

## 2023-12-04 DIAGNOSIS — R590 Localized enlarged lymph nodes: Secondary | ICD-10-CM | POA: Diagnosis not present

## 2023-12-04 DIAGNOSIS — C55 Malignant neoplasm of uterus, part unspecified: Secondary | ICD-10-CM | POA: Diagnosis present

## 2023-12-04 DIAGNOSIS — C799 Secondary malignant neoplasm of unspecified site: Secondary | ICD-10-CM | POA: Insufficient documentation

## 2023-12-04 MED ORDER — HEPARIN SOD (PORK) LOCK FLUSH 100 UNIT/ML IV SOLN
INTRAVENOUS | Status: AC
  Filled 2023-12-04: qty 5

## 2023-12-04 MED ORDER — HEPARIN SOD (PORK) LOCK FLUSH 100 UNIT/ML IV SOLN
500.0000 [IU] | Freq: Once | INTRAVENOUS | Status: AC
  Administered 2023-12-04: 500 [IU] via INTRAVENOUS

## 2023-12-04 MED ORDER — IOHEXOL 300 MG/ML  SOLN
100.0000 mL | Freq: Once | INTRAMUSCULAR | Status: AC | PRN
  Administered 2023-12-04: 100 mL via INTRAVENOUS

## 2023-12-04 NOTE — Telephone Encounter (Signed)
 Patient's husband called office to request medicine for patient's stomach pain. Patient noted to have stomach pain for the past few days. At time of message, patient was having CT scan done.  RN able to speak with patient following CT scan. Patient noted that she has had stomach pain and poor appetite since last chemo treatment on Friday, 7/25.  She states that she has been experiencing diarrhea for the past 2 days as well. No blood noted in stool or fever. Patient has not taken any antidiarrheal medication at this time. Patient notes that she is not passing much gas and pain improves with ambulation. No chest pain or pain with breathing noted.  Patient encouraged to take home nausea medication. Reviewed how best to take these medications. Patient also has pepcid  at home that she can take for any acid reflux-like symptoms.  Patient also encouraged to take her oxycodone  for any pain. Patient has not taken any pain medication for stomach over the past few days. Reviewed how to best take pain medication. Strict ED precautions reviewed with patient and patient aware of availability of nurse triage line as we are going in to the weekend.  Patient and husband verbalized an understanding of the education. All questions answered during call.  Dr. Lonn aware of situation and recommendations.

## 2023-12-04 NOTE — Telephone Encounter (Signed)
 Checked-in with patient to see if she had any improvement after taking advised PRN medications for diarrhea, nausea, and pain.  Patient reports that she has notice improvement and is starting to feel better. Patient knows to continue with regimen as long as symptoms persist.  Patient verbalized an understanding of the information and voiced appreciation for the call.

## 2023-12-08 DIAGNOSIS — E1142 Type 2 diabetes mellitus with diabetic polyneuropathy: Secondary | ICD-10-CM | POA: Diagnosis not present

## 2023-12-08 DIAGNOSIS — Z7901 Long term (current) use of anticoagulants: Secondary | ICD-10-CM | POA: Diagnosis not present

## 2023-12-08 DIAGNOSIS — D630 Anemia in neoplastic disease: Secondary | ICD-10-CM | POA: Diagnosis not present

## 2023-12-08 DIAGNOSIS — Z86718 Personal history of other venous thrombosis and embolism: Secondary | ICD-10-CM | POA: Diagnosis not present

## 2023-12-08 DIAGNOSIS — K223 Perforation of esophagus: Secondary | ICD-10-CM | POA: Diagnosis not present

## 2023-12-08 DIAGNOSIS — T451X5D Adverse effect of antineoplastic and immunosuppressive drugs, subsequent encounter: Secondary | ICD-10-CM | POA: Diagnosis not present

## 2023-12-08 DIAGNOSIS — I4891 Unspecified atrial fibrillation: Secondary | ICD-10-CM | POA: Diagnosis not present

## 2023-12-08 DIAGNOSIS — I10 Essential (primary) hypertension: Secondary | ICD-10-CM | POA: Diagnosis not present

## 2023-12-08 DIAGNOSIS — D61818 Other pancytopenia: Secondary | ICD-10-CM | POA: Diagnosis not present

## 2023-12-08 DIAGNOSIS — D72829 Elevated white blood cell count, unspecified: Secondary | ICD-10-CM | POA: Diagnosis not present

## 2023-12-08 DIAGNOSIS — I69391 Dysphagia following cerebral infarction: Secondary | ICD-10-CM | POA: Diagnosis not present

## 2023-12-08 DIAGNOSIS — E440 Moderate protein-calorie malnutrition: Secondary | ICD-10-CM | POA: Diagnosis not present

## 2023-12-08 DIAGNOSIS — Z87440 Personal history of urinary (tract) infections: Secondary | ICD-10-CM | POA: Diagnosis not present

## 2023-12-08 DIAGNOSIS — G893 Neoplasm related pain (acute) (chronic): Secondary | ICD-10-CM | POA: Diagnosis not present

## 2023-12-08 DIAGNOSIS — C786 Secondary malignant neoplasm of retroperitoneum and peritoneum: Secondary | ICD-10-CM | POA: Diagnosis not present

## 2023-12-08 DIAGNOSIS — Z4803 Encounter for change or removal of drains: Secondary | ICD-10-CM | POA: Diagnosis not present

## 2023-12-08 DIAGNOSIS — J910 Malignant pleural effusion: Secondary | ICD-10-CM | POA: Diagnosis not present

## 2023-12-08 DIAGNOSIS — D6481 Anemia due to antineoplastic chemotherapy: Secondary | ICD-10-CM | POA: Diagnosis not present

## 2023-12-08 DIAGNOSIS — G620 Drug-induced polyneuropathy: Secondary | ICD-10-CM | POA: Diagnosis not present

## 2023-12-11 ENCOUNTER — Encounter: Payer: Self-pay | Admitting: Hematology and Oncology

## 2023-12-11 ENCOUNTER — Inpatient Hospital Stay: Attending: Gynecologic Oncology | Admitting: Hematology and Oncology

## 2023-12-11 ENCOUNTER — Other Ambulatory Visit: Payer: Self-pay | Admitting: Radiology

## 2023-12-11 ENCOUNTER — Telehealth (HOSPITAL_COMMUNITY): Payer: Self-pay | Admitting: Hematology and Oncology

## 2023-12-11 VITALS — BP 112/71 | HR 70 | Temp 98.4°F | Resp 18 | Ht 62.0 in | Wt 155.0 lb

## 2023-12-11 DIAGNOSIS — Z7189 Other specified counseling: Secondary | ICD-10-CM

## 2023-12-11 DIAGNOSIS — G893 Neoplasm related pain (acute) (chronic): Secondary | ICD-10-CM | POA: Insufficient documentation

## 2023-12-11 DIAGNOSIS — J910 Malignant pleural effusion: Secondary | ICD-10-CM | POA: Diagnosis not present

## 2023-12-11 DIAGNOSIS — C786 Secondary malignant neoplasm of retroperitoneum and peritoneum: Secondary | ICD-10-CM | POA: Diagnosis not present

## 2023-12-11 DIAGNOSIS — C55 Malignant neoplasm of uterus, part unspecified: Secondary | ICD-10-CM | POA: Diagnosis not present

## 2023-12-11 DIAGNOSIS — R0602 Shortness of breath: Secondary | ICD-10-CM

## 2023-12-11 DIAGNOSIS — I4891 Unspecified atrial fibrillation: Secondary | ICD-10-CM | POA: Diagnosis not present

## 2023-12-11 DIAGNOSIS — J90 Pleural effusion, not elsewhere classified: Secondary | ICD-10-CM

## 2023-12-11 DIAGNOSIS — C799 Secondary malignant neoplasm of unspecified site: Secondary | ICD-10-CM | POA: Diagnosis not present

## 2023-12-11 MED ORDER — ONDANSETRON HCL 8 MG PO TABS: 8.0000 mg | ORAL_TABLET | Freq: Three times a day (TID) | ORAL | 3 refills | Status: AC | PRN

## 2023-12-11 NOTE — Assessment & Plan Note (Addendum)
 The patient initially presented in April 2024 with postmenopausal bleeding.  CT imaging revealed stage IV disease with metastatic cancer to the pleural fluid as well as peritoneal metastasis Biopsy review high-grade serous carcinoma, MMR normal, Her2 positive, FISh 3+, MSI stable, High grade serous, PIK3CA pathogenic variant, p53 mutated  She received neoadjuvant chemotherapy with combination of carboplatin , paclitaxel  and trastuzumab  with excellent response to therapy, followed by interval debulking surgery and then adjuvant chemotherapy, completed by November 2024, followed by maintenance trastuzumab , then had disease recurrence February 2025 with malignant pleural effusion and abdominal lymphadenopathy, Repeat fluid cytology confirmed HER2 positive disease  CT imaging from April 2025 showed positive response to therapy;however imaging study from August 1 showed disease progression The patient has progressed on carboplatin , paclitaxel , trastuzumab  and dostarlimab   We discussed other treatment options including trastuzumab  deruxtecan That would require cardiac monitoring and we discussed side effects associated with that Alternatively, we would also consider palliative care with support only Given her frail status, progressive weight loss and significant cardiovascular risk factors, it is reasonable to consider palliative care only as her prognosis despite most aggressive chemotherapy is measured in months and not years She is undecided We will call her next week to follow-up on her final decision  I plan to repeat imaging study next month

## 2023-12-11 NOTE — Assessment & Plan Note (Addendum)
 She is currently asymptomatic I contacted interventional radiologist to discuss whether we should remove her Pleurx catheter versus replacing it

## 2023-12-11 NOTE — Progress Notes (Signed)
 [facility] OFFICE PROGRESS NOTE  Patient Care Team: Clarice Nottingham, MD as PCP - General (Internal Medicine) Alvan Ronal BRAVO, MD (Inactive) as PCP - Cardiology (Cardiology) Devona Darice SAUNDERS, RN as Oncology Nurse Navigator (Oncology)  Assessment & Plan Metastasis from cancer of uterus [facility]) The patient initially presented in April 2024 with postmenopausal bleeding.  CT imaging revealed stage IV disease with metastatic cancer to the pleural fluid as well as peritoneal metastasis Biopsy review high-grade serous carcinoma, MMR normal, Her2 positive, FISh 3+, MSI stable, High grade serous, PIK3CA pathogenic variant, p53 mutated  She received neoadjuvant chemotherapy with combination of carboplatin , paclitaxel  and trastuzumab  with excellent response to therapy, followed by interval debulking surgery and then adjuvant chemotherapy, completed by November 2024, followed by maintenance trastuzumab , then had disease recurrence February 2025 with malignant pleural effusion and abdominal lymphadenopathy, Repeat fluid cytology confirmed HER2 positive disease  CT imaging from April 2025 showed positive response to therapy;however imaging study from August 1 showed disease progression The patient has progressed on carboplatin , paclitaxel , trastuzumab  and dostarlimab   We discussed other treatment options including trastuzumab  deruxtecan That would require cardiac monitoring and we discussed side effects associated with that Alternatively, we would also consider palliative care with support only Given her frail status, progressive weight loss and significant cardiovascular risk factors, it is reasonable to consider palliative care only as her prognosis despite most aggressive chemotherapy is measured in months and not years She is undecided We will call her next week to follow-up on her final decision  I plan to repeat imaging study next month Recurrent pleural effusion She is currently  asymptomatic I contacted interventional radiologist to discuss whether we should remove her Pleurx catheter versus replacing it Cancer associated pain She has some mild pain due to presence of Pleurx catheter  she will continue to take oxycodone  as needed Goals of care, counseling/discussion Patient is aware she has stage IV disease and disease is incurable. Treatment offered is palliative in nature.  We discussed CODE STATUS and she does not want to be resuscitated  No orders of the defined types were placed in this encounter.    [name], MD  INTERVAL HISTORY: she returns for surveillance follow-up to review test results She denies shortness of breath She has progressive weight loss She denies bleeding from her anticoagulation therapy Pain is somewhat well-controlled I reviewed multiple imaging studies with the patient and her husband We discussed risk and benefits of pursuing additional treatment  PHYSICAL EXAMINATION: ECOG PERFORMANCE STATUS: 2 - Symptomatic, <50% confined to bed  Vitals:   12/11/23 0845  BP: 112/71  Pulse: 70  Resp: 18  Temp: 98.4 F (36.9 C)  SpO2: 95%   Filed Weights   12/11/23 0845  Weight: 155 lb (70.3 kg)    Relevant data reviewed during this visit included CT imaging from April 2025 and August 2025

## 2023-12-11 NOTE — Telephone Encounter (Signed)
 I called patient to schedule the ordered echocardiogram. Patient declined to schedule due to she will not be taking chemo treatment. I told patient if she decided to have to call office back to schedule. Order will be removed from the echo WQ. If patient calls back we will reinstate the order. Thank you.

## 2023-12-11 NOTE — Assessment & Plan Note (Addendum)
 Patient is aware she has stage IV disease and disease is incurable. Treatment offered is palliative in nature.  We discussed CODE STATUS and she does not want to be resuscitated

## 2023-12-11 NOTE — Assessment & Plan Note (Addendum)
 She has some mild pain due to presence of Pleurx catheter  she will continue to take oxycodone  as needed

## 2023-12-11 NOTE — Telephone Encounter (Signed)
 Thanks, I removed the order

## 2023-12-12 ENCOUNTER — Other Ambulatory Visit: Payer: Self-pay | Admitting: Hematology and Oncology

## 2023-12-12 DIAGNOSIS — C55 Malignant neoplasm of uterus, part unspecified: Secondary | ICD-10-CM

## 2023-12-14 ENCOUNTER — Telehealth: Payer: Self-pay

## 2023-12-14 ENCOUNTER — Other Ambulatory Visit (HOSPITAL_COMMUNITY): Payer: Self-pay | Admitting: Hematology and Oncology

## 2023-12-14 DIAGNOSIS — C55 Malignant neoplasm of uterus, part unspecified: Secondary | ICD-10-CM

## 2023-12-14 DIAGNOSIS — J90 Pleural effusion, not elsewhere classified: Secondary | ICD-10-CM

## 2023-12-14 DIAGNOSIS — C799 Secondary malignant neoplasm of unspecified site: Secondary | ICD-10-CM

## 2023-12-14 NOTE — Telephone Encounter (Signed)
 Patient scheduled for follow-up visit with Dr. Lonn on Thursday, 8/21 at 11:00 AM. Appointment date and time confirmed with patient.  Patient made aware that Dr. Lonn would like her to have CXR done prior to her visit on 8/21. Patient knows that she can have exam done at any time and does not require an appointment. Reviewed check-in instructions with patient.  Patient verbalized an understanding of the information and notes that she will have CXR done prior to her appointment with Dr. Lonn on 8/21.

## 2023-12-14 NOTE — Telephone Encounter (Signed)
 Spoke with patient regarding decision on whether she wished to proceed with additional chemotherapy or pursue palliative care. Patient states that she does not want to move forward with any additional treatment. Dr. Lonn aware. Patient knows that Dr. Lonn would like to see her back in the clinic to consult further on palliative care options. Will call patient back after receiving confirmation from provider as to when she would like her to follow-up. Patient also aware that IR has determined that her pleurx catheter can be removed. Patient is scheduled for removal on Friday, 8/15. Patient aware that she is to stop her Eliquis  48 hours prior to the procedure and to resume the morning after the procedure. Patient verbalized the information. All questions answered during call.

## 2023-12-15 ENCOUNTER — Telehealth: Payer: Self-pay

## 2023-12-15 NOTE — Telephone Encounter (Signed)
 S/w Greig Gavel, RN with [facility] extensively today as pt called her and asked her not to come out this week, as she is having her pleurX removed 8/15. Amy is asking if there is any other nursing need at this time. Advised pt is coming in 8/21 for MD f/u after a CXR to eval for need for thoracentesis and pt has declined further tx and there will be a discussion of care moving forward. She asked that our office will fax us  the OV note 12/24/23 so she knows if the pt needs to be D/C from [facility]. Advised I would make MD and oncoming nurse aware. Fax number provided is [phone]

## 2023-12-17 ENCOUNTER — Other Ambulatory Visit: Payer: Self-pay | Admitting: Student

## 2023-12-18 ENCOUNTER — Ambulatory Visit (HOSPITAL_COMMUNITY)
Admission: RE | Admit: 2023-12-18 | Discharge: 2023-12-18 | Disposition: A | Discharge status: Home / Self Care | Source: Ambulatory Visit | Attending: Radiology | Admitting: Radiology

## 2023-12-18 ENCOUNTER — Other Ambulatory Visit: Payer: Self-pay

## 2023-12-18 ENCOUNTER — Encounter (HOSPITAL_COMMUNITY): Payer: Self-pay

## 2023-12-18 DIAGNOSIS — J910 Malignant pleural effusion: Secondary | ICD-10-CM | POA: Insufficient documentation

## 2023-12-18 DIAGNOSIS — Z4682 Encounter for fitting and adjustment of non-vascular catheter: Secondary | ICD-10-CM | POA: Diagnosis not present

## 2023-12-18 DIAGNOSIS — C55 Malignant neoplasm of uterus, part unspecified: Secondary | ICD-10-CM | POA: Insufficient documentation

## 2023-12-18 DIAGNOSIS — Z87891 Personal history of nicotine dependence: Secondary | ICD-10-CM | POA: Insufficient documentation

## 2023-12-18 HISTORY — PX: IR REMOVAL OF PLURAL CATH W/CUFF: IMG5346

## 2023-12-18 MED ORDER — SODIUM CHLORIDE 0.9 % IV SOLN: INTRAVENOUS | Status: DC

## 2023-12-18 MED ORDER — HEPARIN SOD (PORK) LOCK FLUSH 100 UNIT/ML IV SOLN: 500.0000 [IU] | Freq: Once | INTRAVENOUS | Status: DC

## 2023-12-18 MED ORDER — MIDAZOLAM HCL 2 MG/2ML IJ SOLN
INTRAMUSCULAR | Status: AC | PRN
  Administered 2023-12-18: 1 mg via INTRAVENOUS
  Administered 2023-12-18: .5 mg via INTRAVENOUS

## 2023-12-18 MED ORDER — LIDOCAINE HCL 1 % IJ SOLN
INTRAMUSCULAR | Status: AC
  Filled 2023-12-18: qty 20

## 2023-12-18 MED ORDER — FENTANYL CITRATE (PF) 100 MCG/2ML IJ SOLN
INTRAMUSCULAR | Status: AC
  Filled 2023-12-18: qty 2

## 2023-12-18 MED ORDER — MIDAZOLAM HCL 2 MG/2ML IJ SOLN
INTRAMUSCULAR | Status: AC
  Filled 2023-12-18: qty 2

## 2023-12-18 MED ORDER — FENTANYL CITRATE (PF) 100 MCG/2ML IJ SOLN
INTRAMUSCULAR | Status: AC | PRN
  Administered 2023-12-18 (×2): 50 ug via INTRAVENOUS

## 2023-12-18 MED ORDER — HEPARIN SOD (PORK) LOCK FLUSH 100 UNIT/ML IV SOLN
INTRAVENOUS | Status: AC
  Filled 2023-12-18: qty 5

## 2023-12-18 MED ORDER — LIDOCAINE HCL 1 % IJ SOLN
20.0000 mL | Freq: Once | INTRAMUSCULAR | Status: AC
  Administered 2023-12-18: 7 mL via INTRADERMAL

## 2023-12-18 NOTE — H&P (Signed)
 Chief Complaint: Patient was seen in consultation today for recurrent pleural effusions, improved   Procedure: Pleural PleurX Catheter Removal  Referring Physician(s): Dr. [name]  Supervising Physician: Luverne Aran  Patient Status: [facility] - Out-pt  History of Present Illness: Maria Avila is a 73 y.o. female with a history of metastatic uterine cancer with peritoneal metastases and recurrent malignant effusions. Patient previously underwent pleural PleurX placement in 09/2023 followed by replacement on 6/24 due to non-functioning catheter. Patient reports no drainage from the catheter in approximately 6 weeks. States the home health nurse tried to drain some fluid last Thursday, but was again, unable to get anything to drain. Most recent CT Chest on 8/1 revealed pleurX catheter appropriately positioned with residual partially loculated effusion of the right lung. At follow up with Dr. Bedford patient decided she no longer wished to have additional treatments and would like to move forward with palliative options. It was also determined that her PleurX catheter can be removed. Patient subsequently referred to IR for removal.   Patient presents with her husband at the bedside. Admits to some shortness of breath with progressively worsening cough. She denies any fevers/chills, nausea/vomiting, chest pain, or abdominal pain. NPO since midnight. All questions and concerns answered at the bedside.   Code Status: Full Code  Past Medical History:  Diagnosis Date   Colitis    Endometrial cancer (HCC)    History of radiation therapy    Vagina-04/20/23-05/11/23- Dr. Lynwood Nasuti   Hyperlipidemia    Hypertension    Malnutrition (HCC)    Stroke [facility]) 2019   balance still affected    Past Surgical History:  Procedure Laterality Date   BIOPSY  02/26/2018   Procedure: BIOPSY;  Surgeon: Dianna Specking, MD;  Location: [facility] ENDOSCOPY;  Service: Endoscopy;;   COLONOSCOPY WITH PROPOFOL   N/A 02/26/2018   Procedure: COLONOSCOPY WITH PROPOFOL ;  Surgeon: Dianna Specking, MD;  Location: [facility] ENDOSCOPY;  Service: Endoscopy;  Laterality: N/A;   ESOPHAGOGASTRODUODENOSCOPY (EGD) WITH PROPOFOL  N/A 02/26/2018   Procedure: ESOPHAGOGASTRODUODENOSCOPY (EGD) WITH PROPOFOL ;  Surgeon: Dianna Specking, MD;  Location: [facility] ENDOSCOPY;  Service: Endoscopy;  Laterality: N/A;   IR CHEST FLUORO  10/13/2023   IR IMAGING GUIDED PORT INSERTION  10/21/2022   IR PERC PLEURAL DRAIN W/INDWELL CATH W/IMG GUIDE  09/07/2023   IR PERC PLEURAL DRAIN W/INDWELL CATH W/IMG GUIDE  10/27/2023   IR REMOVAL OF PLURAL CATH W/CUFF  10/27/2023   NO PAST SURGERIES     UNCERTAIN OF NAME OF SURGERY    Allergies: Crestor [rosuvastatin calcium ] and Simvastatin  Medications: Prior to Admission medications   Medication Sig Start Date End Date Taking? Authorizing Provider  [facility] VITAMIN C  500 MG tablet TAKE 1 TABLET BY MOUTH TWICE A DAY 05/26/18  Yes Debby Fidela CROME, NP  Multiple Vitamin (MULTIVITAMIN) capsule Take 1 capsule by mouth daily with breakfast. 07/21/17  Yes [provider]  oxyCODONE  (OXY IR/ROXICODONE ) 5 MG immediate release tablet Take 1 tablet (5 mg total) by mouth every 6 (six) hours as needed for severe pain (pain score 7-10). 11/23/23  Yes Gorsuch, Ni, MD  sennosides-docusate sodium  (SENOKOT-S) 8.6-50 MG tablet Take 1 tablet by mouth 2 (two) times daily as needed for constipation.   Yes [provider]  thiamine  (VITAMIN B-1) 100 MG tablet TAKE 1 TABLET BY MOUTH EVERY DAY Patient taking differently: Take 100 mg by mouth daily. 05/25/18  Yes Kirsteins, Prentice BRAVO, MD  apixaban  (ELIQUIS ) 5 MG TABS tablet Take 1 tablet (5  mg total) by mouth 2 (two) times daily. 07/10/23   Lonn Hicks, MD  benazepril -hydrochlorthiazide (LOTENSIN  HCT) 20-25 MG tablet Take 1 tablet by mouth daily. 11/22/19   [provider]  lidocaine -prilocaine  (EMLA ) cream Apply 1 Application topically daily as needed (prior to port  access). 06/19/23   Lonn Hicks, MD  magnesium  oxide (MAG-OX) 400 (240 Mg) MG tablet Take 1 tablet (400 mg total) by mouth daily. 10/30/23   Lonn Hicks, MD  ondansetron  (ZOFRAN ) 8 MG tablet Take 1 tablet (8 mg total) by mouth every 8 (eight) hours as needed for nausea. 12/11/23   Lonn Hicks, MD  prochlorperazine  (COMPAZINE ) 10 MG tablet TAKE 1 TABLET BY MOUTH EVERY 6 HOURS AS NEEDED FOR NAUSEA OR VOMITING. 12/14/23   Lonn Hicks, MD  TYLENOL  8 HOUR ARTHRITIS PAIN 650 MG CR tablet Take 650-1,300 mg by mouth every 8 (eight) hours as needed for pain.    [provider]     Family History  Problem Relation Age of Onset   Alzheimer's disease Mother    Colon cancer Father    Endometrial cancer Sister        gyn, unsure type   Breast cancer Neg Hx    Ovarian cancer Neg Hx    Prostate cancer Neg Hx    Pancreatic cancer Neg Hx     Social History   Socioeconomic History   Marital status: Married    Spouse name: Not on file   Number of children: Not on file   Years of education: Not on file   Highest education level: Not on file  Occupational History   Not on file  Tobacco Use   Smoking status: Former    Current packs/day: 0.00    Types: Cigarettes    Quit date: 22    Years since quitting: 31.6   Smokeless tobacco: Never  Vaping Use   Vaping status: Never Used  Substance and Sexual Activity   Alcohol use: Yes    Comment: occ wine   Drug use: Not Currently   Sexual activity: Yes  Other Topics Concern   Not on file  Social History Narrative   Not on file   Social Drivers of Health   Financial Resource Strain: Not on file  Food Insecurity: No Food Insecurity (06/30/2023)   Hunger Vital Sign    Worried About Running Out of Food in the Last Year: Never true    Ran Out of Food in the Last Year: Never true  Transportation Needs: No Transportation Needs (06/30/2023)   PRAPARE - Administrator, Civil Service (Medical): No    Lack of Transportation (Non-Medical):  No  Physical Activity: Not on file  Stress: Not on file  Social Connections: Socially Isolated (06/30/2023)   Social Connection and Isolation Panel    Frequency of Communication with Friends and Family: Once a week    Frequency of Social Gatherings with Friends and Family: Never    Attends Religious Services: Never    Database administrator or Organizations: No    Attends Banker Meetings: Never    Marital Status: Married    Review of Systems  Respiratory:  Positive for cough and shortness of breath.   Denies any N/V, chest pain, fevers/chills. All other ROS negative.  Vital Signs: BP 129/72 (BP Location: Right Arm)   Pulse 79   Temp 98.4 F (36.9 C) (Oral)   Resp 17   Ht 5' 2 (1.575 m)  Wt 154 lb 15.7 oz (70.3 kg)   SpO2 95%   BMI 28.35 kg/m    Physical Exam Constitutional:      Appearance: Normal appearance.  HENT:     Mouth/Throat:     Mouth: Mucous membranes are moist.     Pharynx: Oropharynx is clear.  Cardiovascular:     Rate and Rhythm: Normal rate and regular rhythm.     Heart sounds: Normal heart sounds.  Pulmonary:     Effort: Pulmonary effort is normal.     Comments: Diminished throughout  Abdominal:     General: Abdomen is flat.     Palpations: Abdomen is soft.     Tenderness: There is no abdominal tenderness.  Skin:    General: Skin is warm and dry.  Neurological:     General: No focal deficit present.     Mental Status: She is alert and oriented to person, place, and time.  Psychiatric:        Behavior: Behavior normal.     Imaging: CT CHEST ABDOMEN PELVIS W CONTRAST Result Date: 12/10/2023 CLINICAL DATA:  Staging uterine cancer, treatment response. * Tracking Code: BO * EXAM: CT CHEST, ABDOMEN, AND PELVIS WITH CONTRAST TECHNIQUE: Multidetector CT imaging of the chest, abdomen and pelvis was performed following the standard protocol during bolus administration of intravenous contrast. RADIATION DOSE REDUCTION: This exam was  performed according to the departmental dose-optimization program which includes automated exposure control, adjustment of the mA and/or kV according to patient size and/or use of iterative reconstruction technique. CONTRAST:  OMNIPAQUE  IOHEXOL  300 MG/ML  SOLN COMPARISON:  No priors including most recent CT September 02, 2023 FINDINGS: CT CHEST FINDINGS Cardiovascular: Right chest Port-A-Cath with tip near the superior cavoatrial junction. Aortic atherosclerosis. Normal size heart. Trace pericardial effusion. Mediastinum/Nodes: No suspicious thyroid  nodule. Prominent mediastinal and right axillary lymph nodes are increased from prior examination for reference: -right axillary lymph node measures 7 mm in short axis on image 11/2 previously 4 mm -right paratracheal lymph node measures 12 mm in short axis on image 22/8 previously 8 mm. Esophagus is grossly unremarkable. Lungs/Pleura: Interval placement of a right chest drainage catheter. Decreased size of the partially loculated now moderate right pleural effusion. Ground-glass opacity in the right middle lobe on image 80/4 with interposed interstitial thickening. Consolidation in the right lower lobe and right middle lobe favored relaxation atelectasis. Scattered atelectasis/scarring. No suspicious pulmonary nodules or masses identified. Musculoskeletal: No aggressive lytic or blastic lesion of bone. Multilevel degenerative changes spine. Degenerative change of the bilateral shoulders. CT ABDOMEN PELVIS FINDINGS Hepatobiliary: No suspicious hepatic lesion. Cholelithiasis without findings of acute cholecystitis. No biliary ductal dilation. Pancreas: No pancreatic ductal dilation or evidence of acute inflammation. Spleen: No splenomegaly or focal splenic lesion. Adrenals/Urinary Tract: No suspicious adrenal nodule/mass. No hydronephrosis. Kidneys demonstrate symmetric enhancement. Urinary bladder is minimally distended. Stomach/Bowel: No pathologic dilation of small  or large bowel. Extensive colonic diverticulosis. Vascular/Lymphatic: Normal caliber thoracic aorta. Overall increase in size of abdominal retroperitoneal lymph nodes. For reference: -periportal lymph node measures 13 mm in short axis on image 58/2, unchanged -left periaortic lymph node measures 10 mm in short axis on image 61/2 previously 7 mm -retroaortic lymph node measures 11 mm in short axis on image 79/2 previously 8 mm Stable pelvic lymph nodes for instance a right pelvic sidewall lymph node measuring 7 mm in short axis on image 90/2, unchanged when remeasured and a right operator lymph node measuring 7 mm in short axis  on image 95/2, unchanged. Reproductive: Uterus is surgically absent. Other: Increased size of complex soft tissue nodularity with adjacent fluid in the pelvis measuring 6.3 x 3.1 cm on image 97/2 previously 2.6 x 1.8 cm when remeasured for consistency. Similar soft tissue nodularity near the splenic flexure. Question of new soft tissue implant along the inferior margin of the stomach best seen on coronal image 68/6 and on axial image 63/2. Similar anterior abdominal wall nodular soft tissue and stranding for instance on image 68/2. Musculoskeletal: No aggressive lytic or blastic lesion of bone. Multilevel degenerative changes spine. Degenerative changes bilateral hips. Degenerative change of the left greater than right SI joints. Chronic osseous changes of the pubic symphysis. IMPRESSION: 1. Increased size of the complex soft tissue nodularity with adjacent fluid in the pelvis, concerning for worsening disease. 2. Question of new soft tissue implant along the inferior margin of the stomach. 3. Overall increase in size of abdominal retroperitoneal lymph nodes, concerning for worsening nodal disease. 4. Prominent mediastinal and right axillary lymph nodes are increased from prior examination, nonspecific and possibly reactive. Attention on follow-up imaging suggested. 5. Interval placement of a  right chest drainage catheter with decreased size of the partially loculated now moderate right pleural effusion. 6. Ground-glass opacity in the right middle lobe with interposed interstitial thickening, nonspecific and possibly infectious or inflammatory. 7. Cholelithiasis without findings of acute cholecystitis. 8. Extensive colonic diverticulosis without findings of acute diverticulitis. 9. Aortic atherosclerosis. Aortic Atherosclerosis (ICD10-I70.0). Electronically Signed   By: Reyes Holder M.D.   On: 12/10/2023 16:49   DG Chest 2 View Result Date: 11/23/2023 CLINICAL DATA:  Recurrent pleural effusion EXAM: CHEST - 2 VIEW COMPARISON:  11/11/2023 FINDINGS: Port in the anterior chest wall with tip in distal SVC. RIGHT chest tube unchanged. Moderate volume RIGHT pleural effusion is unchanged. RIGHT basilar atelectasis unchanged. No pneumothorax. No pulmonary edema. No acute osseous abnormality. IMPRESSION: 1. No change in RIGHT pleural effusion and RIGHT basilar atelectasis. 2. RIGHT chest tube unchanged. Electronically Signed   By: Jackquline Boxer M.D.   On: 11/23/2023 13:04    Labs:  CBC: Recent Labs    10/27/23 1345 10/28/23 0800 11/09/23 0754 11/27/23 0810  WBC 3.2* 3.5* 1.5* 4.1  HGB 7.9* 8.2* 8.0* 10.5*  HCT 24.8* 24.5* 24.1* 30.6*  PLT 102* 103* 98* 120*    COAGS: Recent Labs    07/02/23 0529 07/03/23 0410 07/07/23 1447 10/27/23 1345  INR 1.1 1.1 2.3 1.1    BMP: Recent Labs    10/27/23 1345 10/28/23 0800 11/09/23 0754 11/27/23 0810  NA 140 139 140 139  K 3.4* 3.5 3.6 3.5  CL 105 105 103 102  CO2 25 26 29 28   GLUCOSE 90 94 100* 156*  BUN 11 11 12 17   CALCIUM  9.4 9.6 9.9 10.2  CREATININE 0.58 0.59 0.59 0.63  GFRNONAA >60 >60 >60 >60    LIVER FUNCTION TESTS: Recent Labs    10/02/23 0832 10/28/23 0800 11/09/23 0754 11/27/23 0810  BILITOT 0.5 0.5 0.3 0.4  AST 14* 12* 13* 13*  ALT 8 7 8 7   ALKPHOS 55 50 48 57  PROT 8.0 7.4 7.2 8.1  ALBUMIN  4.5 4.1 4.0  4.2    TUMOR MARKERS: No results for input(s): AFPTM, CEA, CA199, CHROMGRNA in the last 8760 hours.  Assessment and Plan:  Recurrent pleural effusions, improved: Nejla E Avila is a 73 y.o. female with a history of recurrent pleural effusion secondary to metastatic uterine cancer s/p PleurX catheter  placement for self drainage at home. Patient has been without drainage in 6 weeks and team is now requesting removal of the drain. She presents to [facility] IR department for image-guided pleural PleurX catheter removal with Dr. KANDICE Moan. Procedure to be performed under moderate sedation.  Risks and benefits discussed with the patient including bleeding, infection, damage to adjacent structures, malfunction of the catheter with need for additional procedures.  All of the patient's questions were answered, patient is agreeable to proceed. Consent signed and in chart.   Thank you for this interesting consult. I greatly enjoyed meeting Maria Avila and look forward to participating in their care. A copy of this report was sent to the requesting provider on this date.  Electronically Signed: Analisia Kingsford M Kaleem Sartwell, PA-C 12/18/2023, 8:51 AM   I spent a total of 10 Minutes in face to face clinical consultation, greater than 50% of which was counseling/coordinating care for pleurX catheter removal.

## 2023-12-18 NOTE — Procedures (Signed)
 Interventional Radiology Procedure Note  Procedure: Removal of right PleurX catheter  Complications: None  Estimated Blood Loss: None  Findings: Right PleurX catheter attached to vacuum suction bottle yielding only 10 mL of fluid. Catheter removed utilizing sharp and blunt dissection. Lumen occluded by fibrinous material. Entire catheter removed.  Marcey DASEN. Luverne, M.D Pager:  [phone]

## 2023-12-22 DIAGNOSIS — E440 Moderate protein-calorie malnutrition: Secondary | ICD-10-CM | POA: Diagnosis not present

## 2023-12-22 DIAGNOSIS — Z87440 Personal history of urinary (tract) infections: Secondary | ICD-10-CM | POA: Diagnosis not present

## 2023-12-22 DIAGNOSIS — E1142 Type 2 diabetes mellitus with diabetic polyneuropathy: Secondary | ICD-10-CM | POA: Diagnosis not present

## 2023-12-22 DIAGNOSIS — D72829 Elevated white blood cell count, unspecified: Secondary | ICD-10-CM | POA: Diagnosis not present

## 2023-12-22 DIAGNOSIS — G620 Drug-induced polyneuropathy: Secondary | ICD-10-CM | POA: Diagnosis not present

## 2023-12-22 DIAGNOSIS — K223 Perforation of esophagus: Secondary | ICD-10-CM | POA: Diagnosis not present

## 2023-12-22 DIAGNOSIS — Z86718 Personal history of other venous thrombosis and embolism: Secondary | ICD-10-CM | POA: Diagnosis not present

## 2023-12-22 DIAGNOSIS — D61818 Other pancytopenia: Secondary | ICD-10-CM | POA: Diagnosis not present

## 2023-12-22 DIAGNOSIS — I10 Essential (primary) hypertension: Secondary | ICD-10-CM | POA: Diagnosis not present

## 2023-12-22 DIAGNOSIS — D630 Anemia in neoplastic disease: Secondary | ICD-10-CM | POA: Diagnosis not present

## 2023-12-22 DIAGNOSIS — D6481 Anemia due to antineoplastic chemotherapy: Secondary | ICD-10-CM | POA: Diagnosis not present

## 2023-12-22 DIAGNOSIS — Z4803 Encounter for change or removal of drains: Secondary | ICD-10-CM | POA: Diagnosis not present

## 2023-12-22 DIAGNOSIS — C786 Secondary malignant neoplasm of retroperitoneum and peritoneum: Secondary | ICD-10-CM | POA: Diagnosis not present

## 2023-12-22 DIAGNOSIS — J910 Malignant pleural effusion: Secondary | ICD-10-CM | POA: Diagnosis not present

## 2023-12-22 DIAGNOSIS — Z7901 Long term (current) use of anticoagulants: Secondary | ICD-10-CM | POA: Diagnosis not present

## 2023-12-22 DIAGNOSIS — I4891 Unspecified atrial fibrillation: Secondary | ICD-10-CM | POA: Diagnosis not present

## 2023-12-22 DIAGNOSIS — G893 Neoplasm related pain (acute) (chronic): Secondary | ICD-10-CM | POA: Diagnosis not present

## 2023-12-22 DIAGNOSIS — T451X5D Adverse effect of antineoplastic and immunosuppressive drugs, subsequent encounter: Secondary | ICD-10-CM | POA: Diagnosis not present

## 2023-12-22 DIAGNOSIS — I69391 Dysphagia following cerebral infarction: Secondary | ICD-10-CM | POA: Diagnosis not present

## 2023-12-24 ENCOUNTER — Telehealth: Payer: Self-pay

## 2023-12-24 ENCOUNTER — Encounter: Payer: Self-pay | Admitting: Hematology and Oncology

## 2023-12-24 ENCOUNTER — Ambulatory Visit (HOSPITAL_COMMUNITY)
Admission: RE | Admit: 2023-12-24 | Discharge: 2023-12-24 | Disposition: A | Discharge status: Home / Self Care | Source: Ambulatory Visit | Attending: Hematology and Oncology | Admitting: Hematology and Oncology

## 2023-12-24 ENCOUNTER — Inpatient Hospital Stay

## 2023-12-24 ENCOUNTER — Other Ambulatory Visit (HOSPITAL_COMMUNITY): Payer: Self-pay

## 2023-12-24 ENCOUNTER — Ambulatory Visit (HOSPITAL_BASED_OUTPATIENT_CLINIC_OR_DEPARTMENT_OTHER): Admitting: Hematology and Oncology

## 2023-12-24 VITALS — BP 135/81 | HR 84 | Temp 98.3°F | Resp 18 | Ht 62.0 in | Wt 150.0 lb

## 2023-12-24 DIAGNOSIS — G893 Neoplasm related pain (acute) (chronic): Secondary | ICD-10-CM

## 2023-12-24 DIAGNOSIS — C799 Secondary malignant neoplasm of unspecified site: Secondary | ICD-10-CM

## 2023-12-24 DIAGNOSIS — D61818 Other pancytopenia: Secondary | ICD-10-CM | POA: Diagnosis not present

## 2023-12-24 DIAGNOSIS — C55 Malignant neoplasm of uterus, part unspecified: Secondary | ICD-10-CM

## 2023-12-24 DIAGNOSIS — R64 Cachexia: Secondary | ICD-10-CM | POA: Insufficient documentation

## 2023-12-24 DIAGNOSIS — C786 Secondary malignant neoplasm of retroperitoneum and peritoneum: Secondary | ICD-10-CM | POA: Diagnosis not present

## 2023-12-24 DIAGNOSIS — J90 Pleural effusion, not elsewhere classified: Secondary | ICD-10-CM

## 2023-12-24 DIAGNOSIS — J910 Malignant pleural effusion: Secondary | ICD-10-CM | POA: Diagnosis not present

## 2023-12-24 DIAGNOSIS — Z4682 Encounter for fitting and adjustment of non-vascular catheter: Secondary | ICD-10-CM | POA: Diagnosis not present

## 2023-12-24 LAB — COMPREHENSIVE METABOLIC PANEL WITH GFR
ALT: 6 U/L (ref 0–44)
AST: 12 U/L — ABNORMAL LOW (ref 15–41)
Albumin: 4 g/dL (ref 3.5–5.0)
Alkaline Phosphatase: 57 U/L (ref 38–126)
Anion gap: 7 (ref 5–15)
BUN: 8 mg/dL (ref 8–23)
CO2: 31 mmol/L (ref 22–32)
Calcium: 9.9 mg/dL (ref 8.9–10.3)
Chloride: 102 mmol/L (ref 98–111)
Creatinine, Ser: 0.57 mg/dL (ref 0.44–1.00)
GFR, Estimated: >60 mL/min (ref >60)
Glucose, Bld: 100 mg/dL — ABNORMAL HIGH (ref 70–99)
Potassium: 4.3 mmol/L (ref 3.5–5.1)
Sodium: 140 mmol/L (ref 135–145)
Total Bilirubin: 0.3 mg/dL (ref 0.0–1.2)
Total Protein: 8 g/dL (ref 6.5–8.1)

## 2023-12-24 LAB — CBC WITH DIFFERENTIAL/PLATELET
Abs Immature Granulocytes: 0.01 K/uL (ref 0.00–0.07)
Basophils Absolute: 0 K/uL (ref 0.0–0.1)
Basophils Relative: 0 %
Eosinophils Absolute: 0 K/uL (ref 0.0–0.5)
Eosinophils Relative: 1 %
HCT: 28.6 % — ABNORMAL LOW (ref 36.0–46.0)
Hemoglobin: 9.4 g/dL — ABNORMAL LOW (ref 12.0–15.0)
Immature Granulocytes: 0 %
Lymphocytes Relative: 30 %
Lymphs Abs: 1.2 K/uL (ref 0.7–4.0)
MCH: 32.2 pg (ref 26.0–34.0)
MCHC: 32.9 g/dL (ref 30.0–36.0)
MCV: 97.9 fL (ref 80.0–100.0)
Monocytes Absolute: 0.4 K/uL (ref 0.1–1.0)
Monocytes Relative: 9 %
Neutro Abs: 2.5 K/uL (ref 1.7–7.7)
Neutrophils Relative %: 60 %
Platelets: 130 K/uL — ABNORMAL LOW (ref 150–400)
RBC: 2.92 MIL/uL — ABNORMAL LOW (ref 3.87–5.11)
RDW: 15.6 % — ABNORMAL HIGH (ref 11.5–15.5)
WBC: 4.1 K/uL (ref 4.0–10.5)
nRBC: 0 % (ref 0.0–0.2)

## 2023-12-24 LAB — SAMPLE TO BLOOD BANK

## 2023-12-24 MED ORDER — OXYCODONE HCL 5 MG PO TABS
5.0000 mg | ORAL_TABLET | Freq: Four times a day (QID) | ORAL | 0 refills | Status: DC | PRN
  Filled 2023-12-24: qty 90, 23d supply, fill #0

## 2023-12-24 MED ORDER — MIRTAZAPINE 15 MG PO TABS
15.0000 mg | ORAL_TABLET | Freq: Every day | ORAL | 3 refills | Status: DC
  Filled 2023-12-24: qty 30, 30d supply, fill #0

## 2023-12-24 NOTE — Progress Notes (Signed)
 [facility] OFFICE PROGRESS NOTE  Patient Care Team: Clarice Nottingham, MD as PCP - General (Internal Medicine) Alvan Ronal BRAVO, MD (Inactive) as PCP - Cardiology (Cardiology) Devona Darice SAUNDERS, RN as Oncology Nurse Navigator (Oncology) Lonn Hicks, MD as Consulting Physician (Hematology and Oncology)  Assessment & Plan Metastasis from cancer of uterus [facility]) The patient initially presented in April 2024 with postmenopausal bleeding.  CT imaging revealed stage IV disease with metastatic cancer to the pleural fluid as well as peritoneal metastasis Biopsy review high-grade serous carcinoma, MMR normal, Her2 positive, FISh 3+, MSI stable, High grade serous, PIK3CA pathogenic variant, p53 mutated  She received neoadjuvant chemotherapy with combination of carboplatin , paclitaxel  and trastuzumab  with excellent response to therapy, followed by interval debulking surgery and then adjuvant chemotherapy, completed by November 2024, followed by maintenance trastuzumab , then had disease recurrence February 2025 with malignant pleural effusion and abdominal lymphadenopathy, Repeat fluid cytology confirmed HER2 positive disease  CT imaging from April 2025 showed positive response to therapy;however imaging study from August 1 showed disease progression The patient has progressed on carboplatin , paclitaxel , trastuzumab  We discussed risk and benefits of pursuing additional treatment versus supportive care only and the patient elected to stop chemotherapy and focus on supportive care/quality of life I will see her again in the month for further follow-up and supportive care Recurrent pleural effusion Her Pleurx catheter was malfunctioning and was subsequently removed by interventional radiologist Repeat chest x-ray today shows stability with atelectasis/fibrosis in the right lower lung but overall physical examination is unremarkable and she has good oxygen saturation in room air I will continue to monitor  closely She does not need repeat thoracentesis Malignant cachexia (HCC) She has progressive weight loss with malignant cachexia We discussed the role of appetite stimulant and importance of frequent small meals I recommend a trial of Remeron  Pancytopenia, acquired (HCC) Due to her disease She is not symptomatic Observe only Cancer associated pain Overall, her pain is stable I refilled her prescription oxycodone   No orders of the defined types were placed in this encounter.    Hicks Lonn, MD  INTERVAL HISTORY: she returns for surveillance follow-up after recent removal of Pleurx catheter She complained of weakness and shortness of breath She has lost some weight I reviewed her chest x-ray today We repeated labs and we discussed lab results and future follow-up  PHYSICAL EXAMINATION: ECOG PERFORMANCE STATUS: 2 - Symptomatic, <50% confined to bed  Vitals:   12/24/23 1056  BP: 135/81  Pulse: 84  Resp: 18  Temp: 98.3 F (36.8 C)  SpO2: 100%   Filed Weights   12/24/23 1056  Weight: 150 lb (68 kg)    Relevant data reviewed during this visit included CBC, CMP, chest x-ray

## 2023-12-24 NOTE — Telephone Encounter (Signed)
 Received confirmation of successful fax transmission of signed physician orders.

## 2023-12-24 NOTE — Assessment & Plan Note (Addendum)
 Overall, her pain is stable I refilled her prescription oxycodone 

## 2023-12-24 NOTE — Telephone Encounter (Signed)
 S/w Amy of [facility] regarding patient.  Informed Amy that Dr. Lonn has advised that home health services are no longer required. Patient had pleurx catheter removed last Friday.  Amy verbalized an understanding of the information and states that she does not require any OV or orders faxed to [facility] to confirm plan. She will touch base with patient to determine whether or not patient wanted to keep last 2 visits as patient is covered for the 2 month period.

## 2023-12-24 NOTE — Assessment & Plan Note (Addendum)
 Her Pleurx catheter was malfunctioning and was subsequently removed by interventional radiologist Repeat chest x-ray today shows stability with atelectasis/fibrosis in the right lower lung but overall physical examination is unremarkable and she has good oxygen saturation in room air I will continue to monitor closely She does not need repeat thoracentesis

## 2023-12-24 NOTE — Telephone Encounter (Signed)
 Erin, please contact Home health Since Pleurx is removed we do not need further [facility] services

## 2023-12-24 NOTE — Assessment & Plan Note (Addendum)
 Due to her disease She is not symptomatic Observe only

## 2023-12-24 NOTE — Assessment & Plan Note (Addendum)
 She has progressive weight loss with malignant cachexia We discussed the role of appetite stimulant and importance of frequent small meals I recommend a trial of Remeron 

## 2023-12-24 NOTE — Assessment & Plan Note (Addendum)
 The patient initially presented in April 2024 with postmenopausal bleeding.  CT imaging revealed stage IV disease with metastatic cancer to the pleural fluid as well as peritoneal metastasis Biopsy review high-grade serous carcinoma, MMR normal, Her2 positive, FISh 3+, MSI stable, High grade serous, PIK3CA pathogenic variant, p53 mutated  She received neoadjuvant chemotherapy with combination of carboplatin , paclitaxel  and trastuzumab  with excellent response to therapy, followed by interval debulking surgery and then adjuvant chemotherapy, completed by November 2024, followed by maintenance trastuzumab , then had disease recurrence February 2025 with malignant pleural effusion and abdominal lymphadenopathy, Repeat fluid cytology confirmed HER2 positive disease  CT imaging from April 2025 showed positive response to therapy;however imaging study from August 1 showed disease progression The patient has progressed on carboplatin , paclitaxel , trastuzumab  We discussed risk and benefits of pursuing additional treatment versus supportive care only and the patient elected to stop chemotherapy and focus on supportive care/quality of life I will see her again in the month for further follow-up and supportive care

## 2023-12-29 DIAGNOSIS — D61818 Other pancytopenia: Secondary | ICD-10-CM | POA: Diagnosis not present

## 2023-12-29 DIAGNOSIS — I4891 Unspecified atrial fibrillation: Secondary | ICD-10-CM | POA: Diagnosis not present

## 2023-12-29 DIAGNOSIS — J910 Malignant pleural effusion: Secondary | ICD-10-CM | POA: Diagnosis not present

## 2023-12-29 DIAGNOSIS — D6481 Anemia due to antineoplastic chemotherapy: Secondary | ICD-10-CM | POA: Diagnosis not present

## 2023-12-29 DIAGNOSIS — K223 Perforation of esophagus: Secondary | ICD-10-CM | POA: Diagnosis not present

## 2023-12-29 DIAGNOSIS — E1142 Type 2 diabetes mellitus with diabetic polyneuropathy: Secondary | ICD-10-CM | POA: Diagnosis not present

## 2023-12-29 DIAGNOSIS — Z87440 Personal history of urinary (tract) infections: Secondary | ICD-10-CM | POA: Diagnosis not present

## 2023-12-29 DIAGNOSIS — C786 Secondary malignant neoplasm of retroperitoneum and peritoneum: Secondary | ICD-10-CM | POA: Diagnosis not present

## 2023-12-29 DIAGNOSIS — G620 Drug-induced polyneuropathy: Secondary | ICD-10-CM | POA: Diagnosis not present

## 2023-12-29 DIAGNOSIS — Z4803 Encounter for change or removal of drains: Secondary | ICD-10-CM | POA: Diagnosis not present

## 2023-12-29 DIAGNOSIS — Z7901 Long term (current) use of anticoagulants: Secondary | ICD-10-CM | POA: Diagnosis not present

## 2023-12-29 DIAGNOSIS — G893 Neoplasm related pain (acute) (chronic): Secondary | ICD-10-CM | POA: Diagnosis not present

## 2023-12-29 DIAGNOSIS — I10 Essential (primary) hypertension: Secondary | ICD-10-CM | POA: Diagnosis not present

## 2023-12-29 DIAGNOSIS — I69391 Dysphagia following cerebral infarction: Secondary | ICD-10-CM | POA: Diagnosis not present

## 2023-12-29 DIAGNOSIS — Z86718 Personal history of other venous thrombosis and embolism: Secondary | ICD-10-CM | POA: Diagnosis not present

## 2023-12-29 DIAGNOSIS — T451X5D Adverse effect of antineoplastic and immunosuppressive drugs, subsequent encounter: Secondary | ICD-10-CM | POA: Diagnosis not present

## 2023-12-29 DIAGNOSIS — E440 Moderate protein-calorie malnutrition: Secondary | ICD-10-CM | POA: Diagnosis not present

## 2023-12-29 DIAGNOSIS — D630 Anemia in neoplastic disease: Secondary | ICD-10-CM | POA: Diagnosis not present

## 2023-12-29 DIAGNOSIS — D72829 Elevated white blood cell count, unspecified: Secondary | ICD-10-CM | POA: Diagnosis not present

## 2024-01-05 DIAGNOSIS — Z7901 Long term (current) use of anticoagulants: Secondary | ICD-10-CM | POA: Diagnosis not present

## 2024-01-05 DIAGNOSIS — D61818 Other pancytopenia: Secondary | ICD-10-CM | POA: Diagnosis not present

## 2024-01-05 DIAGNOSIS — I10 Essential (primary) hypertension: Secondary | ICD-10-CM | POA: Diagnosis not present

## 2024-01-05 DIAGNOSIS — T451X5D Adverse effect of antineoplastic and immunosuppressive drugs, subsequent encounter: Secondary | ICD-10-CM | POA: Diagnosis not present

## 2024-01-05 DIAGNOSIS — D72829 Elevated white blood cell count, unspecified: Secondary | ICD-10-CM | POA: Diagnosis not present

## 2024-01-05 DIAGNOSIS — I4891 Unspecified atrial fibrillation: Secondary | ICD-10-CM | POA: Diagnosis not present

## 2024-01-05 DIAGNOSIS — G620 Drug-induced polyneuropathy: Secondary | ICD-10-CM | POA: Diagnosis not present

## 2024-01-05 DIAGNOSIS — C786 Secondary malignant neoplasm of retroperitoneum and peritoneum: Secondary | ICD-10-CM | POA: Diagnosis not present

## 2024-01-05 DIAGNOSIS — E1142 Type 2 diabetes mellitus with diabetic polyneuropathy: Secondary | ICD-10-CM | POA: Diagnosis not present

## 2024-01-05 DIAGNOSIS — Z4803 Encounter for change or removal of drains: Secondary | ICD-10-CM | POA: Diagnosis not present

## 2024-01-05 DIAGNOSIS — Z87440 Personal history of urinary (tract) infections: Secondary | ICD-10-CM | POA: Diagnosis not present

## 2024-01-05 DIAGNOSIS — K223 Perforation of esophagus: Secondary | ICD-10-CM | POA: Diagnosis not present

## 2024-01-05 DIAGNOSIS — I69391 Dysphagia following cerebral infarction: Secondary | ICD-10-CM | POA: Diagnosis not present

## 2024-01-05 DIAGNOSIS — G893 Neoplasm related pain (acute) (chronic): Secondary | ICD-10-CM | POA: Diagnosis not present

## 2024-01-05 DIAGNOSIS — E440 Moderate protein-calorie malnutrition: Secondary | ICD-10-CM | POA: Diagnosis not present

## 2024-01-05 DIAGNOSIS — D6481 Anemia due to antineoplastic chemotherapy: Secondary | ICD-10-CM | POA: Diagnosis not present

## 2024-01-05 DIAGNOSIS — D630 Anemia in neoplastic disease: Secondary | ICD-10-CM | POA: Diagnosis not present

## 2024-01-05 DIAGNOSIS — J910 Malignant pleural effusion: Secondary | ICD-10-CM | POA: Diagnosis not present

## 2024-01-05 DIAGNOSIS — Z86718 Personal history of other venous thrombosis and embolism: Secondary | ICD-10-CM | POA: Diagnosis not present

## 2024-01-12 ENCOUNTER — Telehealth: Payer: Self-pay | Admitting: *Deleted

## 2024-01-12 ENCOUNTER — Other Ambulatory Visit: Payer: Self-pay | Admitting: Hematology and Oncology

## 2024-01-12 NOTE — Telephone Encounter (Signed)
 Received call from pt's husband stating that pt is not eating much & states no appetite & food doesn't taste good. He states Dr Lonn gave her a script for remeron  but can't see much improvement.  He is asking if dose can be increased.  Message to Dr Lonn.

## 2024-01-12 NOTE — Telephone Encounter (Signed)
 Called pt's husband & he states that she has @ 12 remeron  left.  Encouraged to double up on remeron  & we can send in a new script.  Asked about oxycodone  & he reports @ 16 left-@ 4 days worth so will be needing refills.  Message to Dr Lonn & let pt know that scripts will probably be sent in tomorrow.

## 2024-01-12 NOTE — Telephone Encounter (Signed)
 Yes we can double it to 30 mg While you call him back ask if she needs oxycodone  refills and I will send the new script to [facility]

## 2024-01-13 ENCOUNTER — Other Ambulatory Visit (HOSPITAL_COMMUNITY): Payer: Self-pay

## 2024-01-13 ENCOUNTER — Other Ambulatory Visit: Payer: Self-pay | Admitting: Hematology and Oncology

## 2024-01-13 MED ORDER — OXYCODONE HCL 5 MG PO TABS
5.0000 mg | ORAL_TABLET | Freq: Four times a day (QID) | ORAL | 0 refills | Status: AC | PRN
  Filled 2024-01-13: qty 90, 23d supply, fill #0

## 2024-01-13 MED ORDER — MIRTAZAPINE 30 MG PO TABS
30.0000 mg | ORAL_TABLET | Freq: Every day | ORAL | 3 refills | Status: AC
  Filled 2024-01-13: qty 30, 30d supply, fill #0

## 2024-01-13 NOTE — Telephone Encounter (Signed)
 Both prescriptions are sent to [facility]

## 2024-01-14 ENCOUNTER — Other Ambulatory Visit (HOSPITAL_COMMUNITY): Payer: Self-pay

## 2024-01-21 ENCOUNTER — Inpatient Hospital Stay: Admitting: Hematology and Oncology

## 2024-01-21 ENCOUNTER — Telehealth: Payer: Self-pay

## 2024-01-21 ENCOUNTER — Encounter: Payer: Self-pay | Admitting: Hematology and Oncology

## 2024-01-21 ENCOUNTER — Inpatient Hospital Stay: Attending: Gynecologic Oncology

## 2024-01-21 VITALS — BP 124/91 | HR 104 | Temp 97.8°F | Resp 18 | Ht 62.0 in | Wt 136.8 lb

## 2024-01-21 DIAGNOSIS — C786 Secondary malignant neoplasm of retroperitoneum and peritoneum: Secondary | ICD-10-CM | POA: Diagnosis not present

## 2024-01-21 DIAGNOSIS — Z1731 Human epidermal growth factor receptor 2 positive status: Secondary | ICD-10-CM | POA: Diagnosis not present

## 2024-01-21 DIAGNOSIS — J90 Pleural effusion, not elsewhere classified: Secondary | ICD-10-CM | POA: Diagnosis not present

## 2024-01-21 DIAGNOSIS — J910 Malignant pleural effusion: Secondary | ICD-10-CM | POA: Insufficient documentation

## 2024-01-21 DIAGNOSIS — C55 Malignant neoplasm of uterus, part unspecified: Secondary | ICD-10-CM | POA: Diagnosis not present

## 2024-01-21 DIAGNOSIS — C799 Secondary malignant neoplasm of unspecified site: Secondary | ICD-10-CM | POA: Diagnosis not present

## 2024-01-21 DIAGNOSIS — D61818 Other pancytopenia: Secondary | ICD-10-CM

## 2024-01-21 DIAGNOSIS — Z7189 Other specified counseling: Secondary | ICD-10-CM | POA: Diagnosis not present

## 2024-01-21 DIAGNOSIS — R64 Cachexia: Secondary | ICD-10-CM | POA: Diagnosis not present

## 2024-01-21 LAB — CBC WITH DIFFERENTIAL/PLATELET
Abs Immature Granulocytes: 0.01 K/uL (ref 0.00–0.07)
Basophils Absolute: 0 K/uL (ref 0.0–0.1)
Basophils Relative: 0 %
Eosinophils Absolute: 0 K/uL (ref 0.0–0.5)
Eosinophils Relative: 0 %
HCT: 30.3 % — ABNORMAL LOW (ref 36.0–46.0)
Hemoglobin: 9.8 g/dL — ABNORMAL LOW (ref 12.0–15.0)
Immature Granulocytes: 0 %
Lymphocytes Relative: 23 %
Lymphs Abs: 1.3 K/uL (ref 0.7–4.0)
MCH: 31.2 pg (ref 26.0–34.0)
MCHC: 32.3 g/dL (ref 30.0–36.0)
MCV: 96.5 fL (ref 80.0–100.0)
Monocytes Absolute: 0.4 K/uL (ref 0.1–1.0)
Monocytes Relative: 7 %
Neutro Abs: 3.8 K/uL (ref 1.7–7.7)
Neutrophils Relative %: 70 %
Platelets: 256 K/uL (ref 150–400)
RBC: 3.14 MIL/uL — ABNORMAL LOW (ref 3.87–5.11)
RDW: 16.6 % — ABNORMAL HIGH (ref 11.5–15.5)
WBC: 5.5 K/uL (ref 4.0–10.5)
nRBC: 0 % (ref 0.0–0.2)

## 2024-01-21 LAB — COMPREHENSIVE METABOLIC PANEL WITH GFR
ALT: 7 U/L (ref 0–44)
AST: 14 U/L — ABNORMAL LOW (ref 15–41)
Albumin: 3.9 g/dL (ref 3.5–5.0)
Alkaline Phosphatase: 49 U/L (ref 38–126)
Anion gap: 13 (ref 5–15)
BUN: 16 mg/dL (ref 8–23)
CO2: 28 mmol/L (ref 22–32)
Calcium: 10 mg/dL (ref 8.9–10.3)
Chloride: 100 mmol/L (ref 98–111)
Creatinine, Ser: 0.58 mg/dL (ref 0.44–1.00)
GFR, Estimated: >60 mL/min (ref >60)
Glucose, Bld: 99 mg/dL (ref 70–99)
Potassium: 3.6 mmol/L (ref 3.5–5.1)
Sodium: 141 mmol/L (ref 135–145)
Total Bilirubin: 0.4 mg/dL (ref 0.0–1.2)
Total Protein: 8 g/dL (ref 6.5–8.1)

## 2024-01-21 LAB — SAMPLE TO BLOOD BANK

## 2024-01-21 NOTE — Assessment & Plan Note (Addendum)
 The patient initially presented in April 2024 with postmenopausal bleeding.  CT imaging revealed stage IV disease with metastatic cancer to the pleural fluid as well as peritoneal metastasis Biopsy review high-grade serous carcinoma, MMR normal, Her2 positive, FISh 3+, MSI stable, High grade serous, PIK3CA pathogenic variant, p53 mutated  She received neoadjuvant chemotherapy with combination of carboplatin , paclitaxel  and trastuzumab  with excellent response to therapy, followed by interval debulking surgery and then adjuvant chemotherapy, completed by November 2024, followed by maintenance trastuzumab , then had disease recurrence February 2025 with malignant pleural effusion and abdominal lymphadenopathy, Repeat fluid cytology confirmed HER2 positive disease  CT imaging from April 2025 showed positive response to therapy;however imaging study from August 1 showed disease progression The patient has progressed on carboplatin , paclitaxel , trastuzumab  We discussed risk and benefits of pursuing additional treatment versus supportive care only and the patient elected to stop chemotherapy and focus on supportive care/quality of life Over the past 2 months, she has significant deterioration in performance status, overall health and with progressive weight loss I told the patient she is likely dying from her disease With her getting weaker, I recommend referral to home-based hospice and she agreed

## 2024-01-21 NOTE — Assessment & Plan Note (Addendum)
 We had another discussion about goals of care She does not want to be resuscitated I gave her DNR order I will send hospice referral

## 2024-01-21 NOTE — Assessment & Plan Note (Addendum)
 She has progressive cachexia despite Remeron  Recommend frequent small meals and hospice referral

## 2024-01-21 NOTE — Telephone Encounter (Signed)
 Received confirmation of successful fax transmission of hospice referral to [facility].

## 2024-01-21 NOTE — Progress Notes (Signed)
 [facility] OFFICE PROGRESS NOTE  Maria Avila Care Team: Clarice Nottingham, MD as PCP - General (Internal Medicine) Alvan Ronal BRAVO, MD (Inactive) as PCP - Cardiology (Cardiology) Devona Darice SAUNDERS, RN as Oncology Nurse Navigator (Oncology) Lonn Hicks, MD as Consulting Physician (Hematology and Oncology)  Assessment & Plan Metastasis from cancer of uterus [facility]) The Maria Avila initially presented in April 2024 with postmenopausal bleeding.  CT imaging revealed stage IV disease with metastatic cancer to the pleural fluid as well as peritoneal metastasis Biopsy review high-grade serous carcinoma, MMR normal, Her2 positive, FISh 3+, MSI stable, High grade serous, PIK3CA pathogenic variant, p53 mutated  She received neoadjuvant chemotherapy with combination of carboplatin , paclitaxel  and trastuzumab  with excellent response to therapy, followed by interval debulking surgery and then adjuvant chemotherapy, completed by November 2024, followed by maintenance trastuzumab , then had disease recurrence February 2025 with malignant pleural effusion and abdominal lymphadenopathy, Repeat fluid cytology confirmed HER2 positive disease  CT imaging from April 2025 showed positive response to therapy;however imaging study from August 1 showed disease progression The Maria Avila has progressed on carboplatin , paclitaxel , trastuzumab  We discussed risk and benefits of pursuing additional treatment versus supportive care only and the Maria Avila elected to stop chemotherapy and focus on supportive care/quality of life Over the past 2 months, she has significant deterioration in performance status, overall health and with progressive weight loss I told the Maria Avila she is likely dying from her disease With her getting weaker, I recommend referral to home-based hospice and she agreed Malignant cachexia (HCC) She has progressive cachexia despite Remeron  Recommend frequent small meals and hospice referral Recurrent pleural  effusion She has good breath sounds bilaterally She does not need thoracentesis Goals of care, counseling/discussion We had another discussion about goals of care She does not want to be resuscitated I gave her DNR order I will send hospice referral  No orders of the defined types were placed in this encounter.    Hicks Lonn, MD  INTERVAL HISTORY: she returns for surveillance follow-up for metastatic uterine cancer She has lost more weight since last time I saw her She is very weak and continues to have poor appetite Denies worsening shortness of breath We discussed CODE STATUS and reason for hospice referral  PHYSICAL EXAMINATION: ECOG PERFORMANCE STATUS: 3 - Symptomatic, >50% confined to bed  Vitals:   01/21/24 1054  BP: (!) 124/91  Pulse: (!) 104  Resp: 18  Temp: 97.8 F (36.6 C)  SpO2: 92%   Filed Weights   01/21/24 1054  Weight: 136 lb 12.8 oz (62.1 kg)    Relevant data reviewed during this visit included CBC and CMP

## 2024-01-21 NOTE — Assessment & Plan Note (Addendum)
She has good breath sounds bilaterally She does not need thoracentesis

## 2024-02-18 ENCOUNTER — Telehealth: Payer: Self-pay

## 2024-02-18 NOTE — Transitions of Care (Post Inpatient/ED Visit) (Signed)
 02/18/2024  Patient ID: Maria Avila, female   DOB: May 27, 1950, 73 y.o.   MRN: [id number]    Transition of Care: Patient appeared on [facility] workbook. Telephone call to [facility]. Spoke with Melissa, she states patient passed on 03-10-24.    Azrael Maddix J. Akhilesh Sassone RN, MSN [facility], [facility] RN Care Manager Direct Dial: [phone]  Fax: [phone] Website: [email or web address]

## 2024-02-25 ENCOUNTER — Other Ambulatory Visit: Payer: Self-pay | Admitting: Hematology and Oncology
